# Patient Record
Sex: Male | Born: 1935 | Race: White | Hispanic: No | Marital: Married | Smoking: Former smoker
Health system: Southern US, Community
[De-identification: ages and names within clinical notes are randomized; demographics above are authoritative.]

## PROBLEM LIST (undated history)

## (undated) DIAGNOSIS — R590 Localized enlarged lymph nodes: Secondary | ICD-10-CM

## (undated) DIAGNOSIS — Z9109 Other allergy status, other than to drugs and biological substances: Secondary | ICD-10-CM

## (undated) DIAGNOSIS — R6889 Other general symptoms and signs: Secondary | ICD-10-CM

## (undated) DIAGNOSIS — R5382 Chronic fatigue, unspecified: Secondary | ICD-10-CM

## (undated) DIAGNOSIS — C8596 Non-Hodgkin lymphoma, unspecified, intrapelvic lymph nodes: Secondary | ICD-10-CM

## (undated) DIAGNOSIS — G4730 Sleep apnea, unspecified: Secondary | ICD-10-CM

## (undated) DIAGNOSIS — N281 Cyst of kidney, acquired: Secondary | ICD-10-CM

## (undated) DIAGNOSIS — K219 Gastro-esophageal reflux disease without esophagitis: Secondary | ICD-10-CM

## (undated) DIAGNOSIS — K5730 Diverticulosis of large intestine without perforation or abscess without bleeding: Secondary | ICD-10-CM

## (undated) DIAGNOSIS — K9041 Non-celiac gluten sensitivity: Secondary | ICD-10-CM

## (undated) DIAGNOSIS — G479 Sleep disorder, unspecified: Secondary | ICD-10-CM

## (undated) DIAGNOSIS — K2270 Barrett's esophagus without dysplasia: Secondary | ICD-10-CM

## (undated) DIAGNOSIS — M1990 Unspecified osteoarthritis, unspecified site: Secondary | ICD-10-CM

## (undated) DIAGNOSIS — E739 Lactose intolerance, unspecified: Secondary | ICD-10-CM

## (undated) DIAGNOSIS — R600 Localized edema: Secondary | ICD-10-CM

## (undated) DIAGNOSIS — I10 Essential (primary) hypertension: Secondary | ICD-10-CM

## (undated) DIAGNOSIS — R52 Pain, unspecified: Secondary | ICD-10-CM

## (undated) DIAGNOSIS — K449 Diaphragmatic hernia without obstruction or gangrene: Secondary | ICD-10-CM

## (undated) DIAGNOSIS — I509 Heart failure, unspecified: Secondary | ICD-10-CM

## (undated) DIAGNOSIS — C439 Malignant melanoma of skin, unspecified: Secondary | ICD-10-CM

## (undated) DIAGNOSIS — I499 Cardiac arrhythmia, unspecified: Secondary | ICD-10-CM

## (undated) DIAGNOSIS — J9811 Atelectasis: Secondary | ICD-10-CM

## (undated) DIAGNOSIS — Z923 Personal history of irradiation: Secondary | ICD-10-CM

## (undated) DIAGNOSIS — E785 Hyperlipidemia, unspecified: Secondary | ICD-10-CM

## (undated) DIAGNOSIS — C8590 Non-Hodgkin lymphoma, unspecified, unspecified site: Secondary | ICD-10-CM

## (undated) DIAGNOSIS — J45909 Unspecified asthma, uncomplicated: Secondary | ICD-10-CM

## (undated) DIAGNOSIS — N400 Enlarged prostate without lower urinary tract symptoms: Secondary | ICD-10-CM

## (undated) DIAGNOSIS — Z86010 Personal history of colonic polyps: Secondary | ICD-10-CM

## (undated) DIAGNOSIS — D509 Iron deficiency anemia, unspecified: Secondary | ICD-10-CM

## (undated) DIAGNOSIS — R0989 Other specified symptoms and signs involving the circulatory and respiratory systems: Secondary | ICD-10-CM

## (undated) DIAGNOSIS — K2970 Gastritis, unspecified, without bleeding: Secondary | ICD-10-CM

## (undated) DIAGNOSIS — J449 Chronic obstructive pulmonary disease, unspecified: Secondary | ICD-10-CM

## (undated) HISTORY — DX: Personal history of irradiation: Z92.3

## (undated) HISTORY — PX: TONSILLECTOMY: SHX5217

## (undated) HISTORY — DX: Malignant melanoma of skin, unspecified: C43.9

## (undated) HISTORY — PX: EYE SURGERY: SHX253

## (undated) HISTORY — DX: Unspecified asthma, uncomplicated: J45.909

## (undated) HISTORY — PX: TONSILLECTOMY: SUR1361

## (undated) HISTORY — DX: Hyperlipidemia, unspecified: E78.5

## (undated) HISTORY — DX: Essential (primary) hypertension: I10

## (undated) HISTORY — DX: Localized enlarged lymph nodes: R59.0

## (undated) HISTORY — DX: Atelectasis: J98.11

## (undated) HISTORY — DX: Gastro-esophageal reflux disease without esophagitis: K21.9

## (undated) HISTORY — DX: Non-Hodgkin lymphoma, unspecified, unspecified site: C85.90

## (undated) HISTORY — DX: Non-celiac gluten sensitivity: K90.41

## (undated) HISTORY — PX: CATARACT EXTRACTION, BILATERAL: SHX1313

## (undated) HISTORY — PX: HERNIA REPAIR: SHX51

## (undated) HISTORY — DX: Personal history of colonic polyps: Z86.010

## (undated) HISTORY — PX: ROTATOR CUFF REPAIR: SHX139

## (undated) HISTORY — DX: Sleep disorder, unspecified: G47.9

## (undated) HISTORY — DX: Iron deficiency anemia, unspecified: D50.9

## (undated) HISTORY — DX: Barrett's esophagus without dysplasia: K22.70

## (undated) HISTORY — DX: Benign prostatic hyperplasia without lower urinary tract symptoms: N40.0

## (undated) HISTORY — DX: Diverticulosis of large intestine without perforation or abscess without bleeding: K57.30

## (undated) HISTORY — PX: ARTHROSCOPIC REPAIR ACL: SUR80

## (undated) HISTORY — DX: Gastritis, unspecified, without bleeding: K29.70

## (undated) HISTORY — DX: Non-Hodgkin lymphoma, unspecified, intrapelvic lymph nodes: C85.96

## (undated) HISTORY — DX: Sleep apnea, unspecified: G47.30

## (undated) HISTORY — DX: Other specified symptoms and signs involving the circulatory and respiratory systems: R09.89

## (undated) HISTORY — DX: Unspecified osteoarthritis, unspecified site: M19.90

## (undated) HISTORY — DX: Cyst of kidney, acquired: N28.1

## (undated) HISTORY — DX: Lactose intolerance, unspecified: E73.9

## (undated) HISTORY — DX: Localized edema: R60.0

## (undated) HISTORY — DX: Diaphragmatic hernia without obstruction or gangrene: K44.9

## (undated) HISTORY — DX: Chronic fatigue, unspecified: R53.82

---

## 1998-01-14 ENCOUNTER — Other Ambulatory Visit: Admission: RE | Admit: 1998-01-14 | Discharge: 1998-01-14 | Payer: Self-pay | Admitting: Urology

## 1998-09-23 ENCOUNTER — Ambulatory Visit (HOSPITAL_BASED_OUTPATIENT_CLINIC_OR_DEPARTMENT_OTHER): Admission: RE | Admit: 1998-09-23 | Discharge: 1998-09-23 | Payer: Self-pay | Admitting: Orthopedic Surgery

## 2002-02-01 DIAGNOSIS — Z860100 Personal history of colon polyps, unspecified: Secondary | ICD-10-CM

## 2002-02-01 HISTORY — DX: Personal history of colon polyps, unspecified: Z86.0100

## 2002-02-01 HISTORY — DX: Personal history of colonic polyps: Z86.010

## 2002-07-30 ENCOUNTER — Encounter (INDEPENDENT_AMBULATORY_CARE_PROVIDER_SITE_OTHER): Payer: Self-pay | Admitting: *Deleted

## 2002-07-30 ENCOUNTER — Ambulatory Visit (HOSPITAL_COMMUNITY): Admission: RE | Admit: 2002-07-30 | Discharge: 2002-07-30 | Payer: Self-pay | Admitting: Gastroenterology

## 2005-06-29 ENCOUNTER — Observation Stay (HOSPITAL_COMMUNITY): Admission: EM | Admit: 2005-06-29 | Discharge: 2005-07-01 | Payer: Self-pay | Admitting: Emergency Medicine

## 2005-07-05 ENCOUNTER — Ambulatory Visit: Payer: Self-pay | Admitting: Internal Medicine

## 2005-07-26 ENCOUNTER — Ambulatory Visit (HOSPITAL_COMMUNITY): Admission: RE | Admit: 2005-07-26 | Discharge: 2005-07-26 | Payer: Self-pay | Admitting: Internal Medicine

## 2005-07-26 ENCOUNTER — Ambulatory Visit: Payer: Self-pay | Admitting: Internal Medicine

## 2005-07-29 ENCOUNTER — Ambulatory Visit: Payer: Self-pay | Admitting: Internal Medicine

## 2005-07-30 ENCOUNTER — Ambulatory Visit: Payer: Self-pay | Admitting: Cardiology

## 2005-08-02 ENCOUNTER — Ambulatory Visit: Payer: Self-pay | Admitting: Internal Medicine

## 2005-08-05 ENCOUNTER — Ambulatory Visit: Payer: Self-pay | Admitting: Internal Medicine

## 2005-08-19 ENCOUNTER — Ambulatory Visit: Payer: Self-pay | Admitting: Internal Medicine

## 2005-09-30 ENCOUNTER — Ambulatory Visit: Payer: Self-pay | Admitting: Internal Medicine

## 2005-10-01 ENCOUNTER — Ambulatory Visit: Payer: Self-pay | Admitting: Internal Medicine

## 2005-11-10 ENCOUNTER — Ambulatory Visit: Payer: Self-pay | Admitting: Internal Medicine

## 2005-11-23 ENCOUNTER — Ambulatory Visit: Payer: Self-pay | Admitting: Internal Medicine

## 2005-11-23 LAB — CONVERTED CEMR LAB
AST: 33 units/L (ref 0–37)
Alkaline Phosphatase: 59 units/L (ref 39–117)
Total Bilirubin: 0.6 mg/dL (ref 0.3–1.2)
Total Protein: 6.7 g/dL (ref 6.0–8.3)
Triglyceride fasting, serum: 76 mg/dL (ref 0–149)
VLDL: 15 mg/dL (ref 0–40)

## 2006-07-01 ENCOUNTER — Ambulatory Visit: Payer: Self-pay | Admitting: Internal Medicine

## 2006-07-01 LAB — CONVERTED CEMR LAB
BUN: 27 mg/dL — ABNORMAL HIGH (ref 6–23)
Chloride: 114 meq/L — ABNORMAL HIGH (ref 96–112)
Creatinine, Ser: 1 mg/dL (ref 0.4–1.5)
GFR calc non Af Amer: 78 mL/min
Potassium: 4.2 meq/L (ref 3.5–5.1)

## 2006-07-29 ENCOUNTER — Ambulatory Visit: Payer: Self-pay | Admitting: Internal Medicine

## 2006-07-29 LAB — CONVERTED CEMR LAB
BUN: 26 mg/dL — ABNORMAL HIGH (ref 6–23)
CO2: 28 meq/L (ref 19–32)
Cholesterol: 159 mg/dL (ref 0–200)
Glucose, Bld: 141 mg/dL — ABNORMAL HIGH (ref 70–99)
HDL: 43.8 mg/dL (ref >39.0)
LDL Cholesterol: 94 mg/dL (ref 0–99)
PSA: 1.79 ng/mL (ref 0.10–4.00)
Potassium: 3.7 meq/L (ref 3.5–5.1)
TSH: 0.97 microintl units/mL (ref 0.35–5.50)
Total Bilirubin: 0.6 mg/dL (ref 0.3–1.2)
Total CHOL/HDL Ratio: 3.6
Triglycerides: 108 mg/dL (ref 0–149)
VLDL: 22 mg/dL (ref 0–40)

## 2006-09-14 ENCOUNTER — Encounter: Payer: Self-pay | Admitting: Internal Medicine

## 2006-09-14 DIAGNOSIS — E785 Hyperlipidemia, unspecified: Secondary | ICD-10-CM | POA: Insufficient documentation

## 2006-09-14 DIAGNOSIS — N400 Enlarged prostate without lower urinary tract symptoms: Secondary | ICD-10-CM

## 2006-09-14 DIAGNOSIS — I10 Essential (primary) hypertension: Secondary | ICD-10-CM | POA: Insufficient documentation

## 2006-09-14 DIAGNOSIS — M1990 Unspecified osteoarthritis, unspecified site: Secondary | ICD-10-CM | POA: Insufficient documentation

## 2006-09-14 DIAGNOSIS — Z8679 Personal history of other diseases of the circulatory system: Secondary | ICD-10-CM | POA: Insufficient documentation

## 2007-01-07 ENCOUNTER — Encounter: Payer: Self-pay | Admitting: Internal Medicine

## 2007-01-12 ENCOUNTER — Ambulatory Visit: Payer: Self-pay | Admitting: Internal Medicine

## 2007-01-12 ENCOUNTER — Telehealth: Payer: Self-pay | Admitting: Internal Medicine

## 2007-01-12 LAB — CONVERTED CEMR LAB
AST: 22 units/L (ref 0–37)
Calcium: 8.7 mg/dL (ref 8.4–10.5)
Cholesterol: 185 mg/dL (ref 0–200)
Creatinine, Ser: 0.9 mg/dL (ref 0.4–1.5)
GFR calc Af Amer: 106 mL/min
GFR calc non Af Amer: 88 mL/min
Glucose, Bld: 98 mg/dL (ref 70–99)
HDL: 58.2 mg/dL (ref >39.0)
Sodium: 140 meq/L (ref 135–145)
VLDL: 22 mg/dL (ref 0–40)

## 2007-01-16 ENCOUNTER — Encounter: Payer: Self-pay | Admitting: Internal Medicine

## 2007-01-17 ENCOUNTER — Telehealth: Payer: Self-pay | Admitting: Internal Medicine

## 2007-01-24 ENCOUNTER — Ambulatory Visit: Payer: Self-pay | Admitting: Internal Medicine

## 2007-01-24 DIAGNOSIS — J069 Acute upper respiratory infection, unspecified: Secondary | ICD-10-CM | POA: Insufficient documentation

## 2007-06-20 ENCOUNTER — Ambulatory Visit: Payer: Self-pay | Admitting: Internal Medicine

## 2007-07-05 ENCOUNTER — Telehealth (INDEPENDENT_AMBULATORY_CARE_PROVIDER_SITE_OTHER): Payer: Self-pay | Admitting: *Deleted

## 2007-08-09 ENCOUNTER — Ambulatory Visit: Payer: Self-pay | Admitting: Internal Medicine

## 2007-08-09 DIAGNOSIS — K900 Celiac disease: Secondary | ICD-10-CM

## 2007-08-09 LAB — CONVERTED CEMR LAB
ALT: 22 units/L (ref 0–53)
Alkaline Phosphatase: 76 units/L (ref 39–117)
Cholesterol: 170 mg/dL (ref 0–200)
Glucose, Bld: 93 mg/dL (ref 70–99)
HDL: 45.9 mg/dL (ref >39.0)
LDL Cholesterol: 98 mg/dL (ref 0–99)
Total Bilirubin: 0.7 mg/dL (ref 0.3–1.2)
VLDL: 26 mg/dL (ref 0–40)

## 2007-08-11 ENCOUNTER — Encounter: Payer: Self-pay | Admitting: Internal Medicine

## 2007-08-12 ENCOUNTER — Encounter: Payer: Self-pay | Admitting: Internal Medicine

## 2007-08-12 ENCOUNTER — Emergency Department (HOSPITAL_COMMUNITY): Admission: EM | Admit: 2007-08-12 | Discharge: 2007-08-12 | Payer: Self-pay | Admitting: Family Medicine

## 2007-10-17 ENCOUNTER — Ambulatory Visit: Payer: Self-pay | Admitting: Internal Medicine

## 2007-10-17 DIAGNOSIS — E739 Lactose intolerance, unspecified: Secondary | ICD-10-CM

## 2007-11-17 ENCOUNTER — Ambulatory Visit: Payer: Self-pay | Admitting: Internal Medicine

## 2007-12-25 ENCOUNTER — Ambulatory Visit: Payer: Self-pay | Admitting: Internal Medicine

## 2007-12-25 ENCOUNTER — Ambulatory Visit: Payer: Self-pay

## 2007-12-25 DIAGNOSIS — R002 Palpitations: Secondary | ICD-10-CM | POA: Insufficient documentation

## 2008-01-07 ENCOUNTER — Encounter: Payer: Self-pay | Admitting: Internal Medicine

## 2008-01-17 ENCOUNTER — Telehealth: Payer: Self-pay | Admitting: Internal Medicine

## 2008-01-22 ENCOUNTER — Ambulatory Visit: Payer: Self-pay | Admitting: Internal Medicine

## 2008-01-23 ENCOUNTER — Ambulatory Visit: Payer: Self-pay | Admitting: Internal Medicine

## 2008-01-23 LAB — CONVERTED CEMR LAB
Cholesterol: 171 mg/dL (ref 0–200)
HDL: 41.8 mg/dL (ref >39.0)
LDL Cholesterol: 102 mg/dL — ABNORMAL HIGH (ref 0–99)
VLDL: 27 mg/dL (ref 0–40)

## 2008-02-08 ENCOUNTER — Encounter: Payer: Self-pay | Admitting: Internal Medicine

## 2008-02-09 ENCOUNTER — Telehealth: Payer: Self-pay | Admitting: Internal Medicine

## 2008-02-12 ENCOUNTER — Ambulatory Visit: Payer: Self-pay | Admitting: Internal Medicine

## 2008-02-12 DIAGNOSIS — K439 Ventral hernia without obstruction or gangrene: Secondary | ICD-10-CM | POA: Insufficient documentation

## 2008-02-28 ENCOUNTER — Encounter: Payer: Self-pay | Admitting: Internal Medicine

## 2008-04-16 ENCOUNTER — Encounter: Payer: Self-pay | Admitting: Internal Medicine

## 2008-04-22 ENCOUNTER — Telehealth: Payer: Self-pay | Admitting: Internal Medicine

## 2008-04-22 ENCOUNTER — Encounter: Payer: Self-pay | Admitting: Internal Medicine

## 2008-05-16 ENCOUNTER — Telehealth: Payer: Self-pay | Admitting: Internal Medicine

## 2008-05-20 ENCOUNTER — Ambulatory Visit: Payer: Self-pay | Admitting: Internal Medicine

## 2008-05-20 DIAGNOSIS — J3089 Other allergic rhinitis: Secondary | ICD-10-CM

## 2008-05-20 DIAGNOSIS — B370 Candidal stomatitis: Secondary | ICD-10-CM

## 2008-08-01 ENCOUNTER — Ambulatory Visit: Payer: Self-pay | Admitting: Internal Medicine

## 2008-08-01 DIAGNOSIS — N529 Male erectile dysfunction, unspecified: Secondary | ICD-10-CM | POA: Insufficient documentation

## 2008-08-13 ENCOUNTER — Telehealth: Payer: Self-pay | Admitting: Internal Medicine

## 2008-08-19 ENCOUNTER — Encounter: Payer: Self-pay | Admitting: Internal Medicine

## 2008-10-04 ENCOUNTER — Telehealth: Payer: Self-pay | Admitting: Internal Medicine

## 2008-11-09 ENCOUNTER — Telehealth: Payer: Self-pay | Admitting: Family Medicine

## 2008-12-23 ENCOUNTER — Telehealth: Payer: Self-pay | Admitting: Internal Medicine

## 2008-12-23 ENCOUNTER — Ambulatory Visit: Payer: Self-pay | Admitting: Internal Medicine

## 2008-12-23 DIAGNOSIS — H6980 Other specified disorders of Eustachian tube, unspecified ear: Secondary | ICD-10-CM

## 2008-12-23 LAB — CONVERTED CEMR LAB
ALT: 29 units/L (ref 0–53)
AST: 27 units/L (ref 0–37)
Alkaline Phosphatase: 84 units/L (ref 39–117)
BUN: 22 mg/dL (ref 6–23)
CO2: 31 meq/L (ref 19–32)
Calcium: 8.8 mg/dL (ref 8.4–10.5)
Chloride: 104 meq/L (ref 96–112)
Direct LDL: 165.7 mg/dL
GFR calc non Af Amer: 87.41 mL/min (ref >60)
Glucose, Bld: 100 mg/dL — ABNORMAL HIGH (ref 70–99)
Total Bilirubin: 0.6 mg/dL (ref 0.3–1.2)
Total CHOL/HDL Ratio: 5
VLDL: 25.2 mg/dL (ref 0.0–40.0)

## 2008-12-24 DIAGNOSIS — G4700 Insomnia, unspecified: Secondary | ICD-10-CM

## 2008-12-30 ENCOUNTER — Encounter: Payer: Self-pay | Admitting: Internal Medicine

## 2009-01-13 ENCOUNTER — Ambulatory Visit: Payer: Self-pay | Admitting: Internal Medicine

## 2009-01-13 ENCOUNTER — Telehealth (INDEPENDENT_AMBULATORY_CARE_PROVIDER_SITE_OTHER): Payer: Self-pay | Admitting: *Deleted

## 2009-01-20 ENCOUNTER — Telehealth: Payer: Self-pay | Admitting: Internal Medicine

## 2009-01-23 ENCOUNTER — Telehealth: Payer: Self-pay | Admitting: Internal Medicine

## 2009-02-11 ENCOUNTER — Encounter: Payer: Self-pay | Admitting: Internal Medicine

## 2009-02-28 ENCOUNTER — Encounter: Payer: Self-pay | Admitting: Internal Medicine

## 2009-03-04 ENCOUNTER — Encounter: Payer: Self-pay | Admitting: Internal Medicine

## 2009-03-06 ENCOUNTER — Encounter: Payer: Self-pay | Admitting: Internal Medicine

## 2009-03-24 ENCOUNTER — Telehealth: Payer: Self-pay | Admitting: Internal Medicine

## 2009-03-25 ENCOUNTER — Encounter: Payer: Self-pay | Admitting: Internal Medicine

## 2009-04-22 ENCOUNTER — Ambulatory Visit: Payer: Self-pay | Admitting: Internal Medicine

## 2009-05-20 ENCOUNTER — Telehealth: Payer: Self-pay | Admitting: Internal Medicine

## 2009-08-19 ENCOUNTER — Ambulatory Visit: Payer: Self-pay | Admitting: Internal Medicine

## 2009-08-19 DIAGNOSIS — I872 Venous insufficiency (chronic) (peripheral): Secondary | ICD-10-CM

## 2009-08-19 DIAGNOSIS — R609 Edema, unspecified: Secondary | ICD-10-CM

## 2009-08-19 LAB — CONVERTED CEMR LAB
Basophils Absolute: 0 10*3/uL (ref 0.0–0.1)
Basophils Relative: 0.6 % (ref 0.0–3.0)
Chloride: 110 meq/L (ref 96–112)
Eosinophils Relative: 1.3 % (ref 0.0–5.0)
GFR calc non Af Amer: 86.15 mL/min (ref >60)
Lymphocytes Relative: 21.2 % (ref 12.0–46.0)
Lymphs Abs: 1.7 10*3/uL (ref 0.7–4.0)
MCHC: 34.2 g/dL (ref 30.0–36.0)
Neutrophils Relative %: 72.2 % (ref 43.0–77.0)
Platelets: 232 10*3/uL (ref 150.0–400.0)
Potassium: 4.1 meq/L (ref 3.5–5.1)
Pro B Natriuretic peptide (BNP): 50 pg/mL (ref 0.0–100.0)
RBC: 4.63 M/uL (ref 4.22–5.81)
Sodium: 144 meq/L (ref 135–145)

## 2009-08-29 ENCOUNTER — Ambulatory Visit: Payer: Self-pay | Admitting: Cardiovascular Disease

## 2009-08-29 ENCOUNTER — Ambulatory Visit (HOSPITAL_COMMUNITY): Admission: RE | Admit: 2009-08-29 | Discharge: 2009-08-29 | Payer: Self-pay | Admitting: Internal Medicine

## 2009-08-29 ENCOUNTER — Encounter: Payer: Self-pay | Admitting: Internal Medicine

## 2009-08-29 ENCOUNTER — Ambulatory Visit: Payer: Self-pay

## 2009-09-30 ENCOUNTER — Telehealth: Payer: Self-pay | Admitting: Internal Medicine

## 2009-10-07 ENCOUNTER — Encounter: Payer: Self-pay | Admitting: Internal Medicine

## 2009-10-08 ENCOUNTER — Telehealth: Payer: Self-pay | Admitting: Internal Medicine

## 2010-03-05 NOTE — Letter (Signed)
Summary: [facility]  [facility]   Imported By: Phillis Knack 03/26/2009 07:54:24  _____________________________________________________________________  External Attachment:    Type:   Image     Comment:   External Document

## 2010-03-05 NOTE — Progress Notes (Signed)
  Phone Note Outgoing Call Call back at [facility] Phone [phone]   Call placed by: norins Call placed to: Patient Reason for Call: Discuss lab or test results Summary of Call: told him to check the June 19th note I sent which included all lab. He was informed that his 2 D echo did not reveal any cause for weakness. He reports that he is working on loosing weight, has been walking and feels great. No need for f/u exam.  Initial call taken by: Neena Rhymes MD,  September 30, 2009 1:12 PM

## 2010-03-05 NOTE — Assessment & Plan Note (Signed)
Summary: FATIGUE/ SWOLLEN ANKLES/ NWS   Vital Signs:  Patient profile:   77 year old male Height:      67.5 inches Weight:      184 pounds BMI:     28.50 O2 Sat:      97 % on Room air Temp:     97.1 degrees F oral Pulse rate:   88 / minute BP sitting:   130 / 82  (left arm) Cuff size:   regular  Vitals Entered By: Charlynne Cousins CMA (August 19, 2009 2:04 PM)  O2 Flow:  Room air CC: pt here with c/o swollen ankles and fatigue/ ab   Primary Care Provider:  Neena Rhymes MD  CC:  pt here with c/o swollen ankles and fatigue/ ab.  History of Present Illness: Presents for swollen ankles and also for severe fatigue and decreased exercise tolerance. He, and his wife, are concerned for possible heart disease. He was evaluated about 4 years ago for CAD/MI with a negative work-up. He had a negative cardiac cath in '07, 2D echo in '07 -per patient negative study. Lab work as recently as Nov '10 with nl Creatinine and GFR.  Patient has a problem with fatigue. Again labs in the Fall in years past revealed no abnormaltiy.   Current Medications (verified): 1)  Low-Dose Aspirin 81 Mg  Tabs (Aspirin) .... Take 1 Tablet By Mouth Once A Day 2)  Norvasc 5 Mg  Tabs (Amlodipine Besylate) .... Take 1/2 Tablet By Mouth Once A Day 3)  Diclofenac Sodium 75 Mg  Tbec (Diclofenac Sodium) .... Take 1 Tablet By Mouth Once A Day As Needed 4)  Vitamin D 1000 Unit  Tabs (Cholecalciferol) .... Take 1 Tablet By Mouth Once A Day 5)  Nasonex 50 Mcg/act  Susp (Mometasone Furoate) .Marland Kitchen.. 1 Spray Each Nostril As Needed Once Daily 6)  Benazepril-Hydrochlorothiazide 10-12.5 Mg  Tabs (Benazepril-Hydrochlorothiazide) .... Take 1 Tablet By Mouth Once A Day 7)  Finasteride 5 Mg  Tabs (Finasteride) .... Take 1 Tablet By Mouth Once A Day 8)  Epipen Jr 0.15 Mg/0.27m (1:2000)  Devi (Epinephrine Hcl (Anaphylaxis)) .... Inject Subcutaneously As Needed 9)  Alprazolam 0.5 Mg Tabs (Alprazolam) .... Take 1 Tablet By Mouth At Bedtime  Prn 10)  Fish Oil 1000 Mg  Caps (Omega-3 Fatty Acids) .... Take 1 Tablet By Mouth Two Times A Day 11)  Multivitamins   Tabs (Multiple Vitamin) .... Take 1 Tablet By Mouth Once A Day 12)  Cialis 20 Mg Tabs (Tadalafil) ..Marland Kitchen. 1 As Needed 13)  Promethazine-Codeine 6.25-10 Mg/517mSyrp (Promethazine-Codeine) ...Marland Kitchen 1 Tsp Q 6 Hours, 2 Tsp At Bedtime. 14)  Omeprazole 20 Mg Cpdr (Omeprazole) ...Marland Kitchen 1 Once Daily  Allergies (verified): No Known Drug Allergies  Past History:  Past Medical History: Last updated: 12/23/2008 Hyperlipidemia Hypertension Benign prostatic hypertrophy Osteoarthritis Chronic fatigue gluten allergy lactose intolerance melanoma(?) Chronic sleep disorder     physician roster:      cardilogy - Dr. KeClaiborne Billings    [facility] - Dr. DaLuther Bradley Dr. PaAdriana Mccallum Dr. JoRonnald Ramp    Allergy - Dr. ShDonneta Romberg    Rheum - Dr. TrRebeca Allegra Dr. EpCharise Killian    GI -  Dr. MaCollene Maresllergic rhinitis  Past Surgical History: Last updated: 08/09/2007 Rotator cuff repair Hernia repair arthroscopic knee sx Cataract extraction Tonsillectomy  Social History: Last updated:  08/09/2007 Married - second marriage he has two children retired Hospital doctor currently teaches pottery making, has a Government social research officer in [employer] Former Smoker  quit 12 years ago - smoked for 35 years Alcohol use-yes  Review of Systems  The patient denies anorexia, fever, weight loss, weight gain, decreased hearing, hoarseness, chest pain, syncope, dyspnea on exertion, prolonged cough, headaches, abdominal pain, incontinence, muscle weakness, suspicious skin lesions, and unusual weight change.    Physical Exam  General:  WNWD white male in no distress. Head:  normocephalic and atraumatic.   Eyes:  C&S clear Neck:  supple and no JVD.   Lungs:  normal respiratory effort and normal breath sounds.   Heart:  normal rate and regular rhythm.   Pulses:  2+ radial pulse Extremities:  1+ pedal  edema Neurologic:  alert & oriented X3, cranial nerves II-XII intact, strength normal in all extremities, and gait normal.   Skin:  turgor normal and color normal.     Impression & Recommendations:  Problem # 1:  PEDAL EDEMA (ICD-782.3) Reviewed records including previous cath report and renal function labs. No underlying systemic cause for pedal edema based on those studies. Reviewed the mechinism of venous insufficiency.  Plan - repeat 2D echo  His updated medication list for this problem includes:    Benazepril-hydrochlorothiazide 10-12.5 Mg Tabs (Benazepril-hydrochlorothiazide) .Marland Kitchen... Take 1 tablet by mouth once a day  Problem # 2:  FATIGUE (ICD-780.79) Patient with complaints of excessive fatigue and decreased energy. Exam is unrevealing. Previous lab studies normal as well as 2D echo of '07.  Plan - r/o metabolic derangement and cardiac dysfunction as cause of fatigue.   Orders: TLB-B12 + Folate Pnl (82746_82607-B12/FOL) TLB-BNP (B-Natriuretic Peptide) (83880-BNPR) TLB-BMP (Basic Metabolic Panel-BMET) (16109-UEAVWUJ) TLB-CBC Platelet - w/Differential (85025-CBCD) TLB-TSH (Thyroid Stimulating Hormone) (81191-YNW) Cardiology Referral (Cardiology)  Addendum - lab results are normal.                     2D echo pending.  Complete Medication List: 1)  Low-dose Aspirin 81 Mg Tabs (Aspirin) .... Take 1 tablet by mouth once a day 2)  Norvasc 5 Mg Tabs (Amlodipine besylate) .... Take 1/2 tablet by mouth once a day 3)  Diclofenac Sodium 75 Mg Tbec (Diclofenac sodium) .... Take 1 tablet by mouth once a day as needed 4)  Vitamin D 1000 Unit Tabs (Cholecalciferol) .... Take 1 tablet by mouth once a day 5)  Nasonex 50 Mcg/act Susp (Mometasone furoate) .Marland Kitchen.. 1 spray each nostril as needed once daily 6)  Benazepril-hydrochlorothiazide 10-12.5 Mg Tabs (Benazepril-hydrochlorothiazide) .... Take 1 tablet by mouth once a day 7)  Finasteride 5 Mg Tabs (Finasteride) .... Take 1 tablet by mouth  once a day 8)  Epipen Jr 0.15 Mg/0.22m (1:2000) Devi (Epinephrine hcl (anaphylaxis)) .... Inject subcutaneously as needed 9)  Alprazolam 0.5 Mg Tabs (Alprazolam) .... Take 1 tablet by mouth at bedtime prn 10)  Fish Oil 1000 Mg Caps (Omega-3 fatty acids) .... Take 1 tablet by mouth two times a day 11)  Multivitamins Tabs (Multiple vitamin) .... Take 1 tablet by mouth once a day 12)  Cialis 20 Mg Tabs (Tadalafil) ..Marland Kitchen. 1 as needed 13)  Promethazine-codeine 6.25-10 Mg/554mSyrp (Promethazine-codeine) ...Marland Kitchen 1 tsp q 6 hours, 2 tsp at bedtime. 14)  Omeprazole 20 Mg Cpdr (Omeprazole) ...Marland Kitchen 1 once daily  Patient: Ryan [name] Note: All result statuses are Final unless otherwise noted.  Tests: (1) B12 + Folate Panel (B12/FOL)   Vitamin B12  286 pg/mL                   211-911   Folate                    15.5 ng/mL     Deficient  0.4 - 3.4 ng/mL     Indeterminate  3.4 - 5.4 ng/mL     Normal  >5.4 ng/mL  Tests: (2) B-Type Natiuretic Peptide (BNPR)  B-Type Natriuetic Peptide                             50.0 pg/mL                  0.0-100.0  Tests: (3) BMP (METABOL)   Sodium                    144 mEq/L                   135-145   Potassium                 4.1 mEq/L                   3.5-5.1   Chloride                  110 mEq/L                   96-112   Carbon Dioxide            29 mEq/L                    19-32   Glucose              [H]  100 mg/dL                   70-99   BUN                  [H]  28 mg/dL                    6-23   Creatinine                0.9 mg/dL                   0.4-1.5   Calcium                   8.9 mg/dL                   8.4-10.5   GFR                       86.15 mL/min                >60  Tests: (4) CBC Platelet w/Diff (CBCD)   White Cell Count          8.0 K/uL                    4.5-10.5   Red Cell Count            4.63 Mil/uL                 4.22-5.81   Hemoglobin  14.7 g/dL                   13.0-17.0   Hematocrit                 43.1 %                      39.0-52.0   MCV                       93.0 fl                     78.0-100.0   MCHC                      34.2 g/dL                   30.0-36.0   RDW                       13.1 %                      11.5-14.6   Platelet Count            232.0 K/uL                  150.0-400.0   Neutrophil %              72.2 %                      43.0-77.0   Lymphocyte %              21.2 %                      12.0-46.0   Monocyte %                4.7 %                       3.0-12.0   Eosinophils%              1.3 %                       0.0-5.0   Basophils %               0.6 %                       0.0-3.0   Neutrophill Absolute      5.8 K/uL                    1.4-7.7   Lymphocyte Absolute       1.7 K/uL                    0.7-4.0   Monocyte Absolute         0.4 K/uL                    0.1-1.0  Eosinophils, Absolute                             0.1 K/uL  0.0-0.7   Basophils Absolute        0.0 K/uL                    0.0-0.1  Tests: (5) TSH (TSH)   FastTSH                   1.26 uIU/mL                 0.35-5.50

## 2010-03-05 NOTE — Letter (Signed)
Summary: [facility]  [facility]   Imported By: Bubba Hales 10/13/2009 10:13:19  _____________________________________________________________________  External Attachment:    Type:   Image     Comment:   External Document

## 2010-03-05 NOTE — Letter (Signed)
Summary: [facility]  [facility]   Imported By: Phillis Knack 03/27/2009 11:07:44  _____________________________________________________________________  External Attachment:    Type:   Image     Comment:   External Document

## 2010-03-05 NOTE — Miscellaneous (Signed)
[facility]: [facility]   Imported By: Bubba Hales 05/01/2009 11:07:00  _____________________________________________________________________  External Attachment:    Type:   Image     Comment:   External Document

## 2010-03-05 NOTE — Letter (Signed)
Summary: [facility]  [facility]   Imported By: Phillis Knack 03/10/2009 09:50:50  _____________________________________________________________________  External Attachment:    Type:   Image     Comment:   External Document

## 2010-03-05 NOTE — Letter (Signed)
Summary: [facility]  [facility]   Imported By: Phillis Knack 03/10/2009 09:43:34  _____________________________________________________________________  External Attachment:    Type:   Image     Comment:   External Document

## 2010-03-05 NOTE — Assessment & Plan Note (Signed)
Summary: CHEST CONGEST--SORE THROAT--STC   Vital Signs:  Patient profile:   77 year old male Height:      67.5 inches (171.45 cm) Weight:      184 pounds (83.64 kg) O2 Sat:      95 % on Room air Temp:     98.0 degrees F (36.67 degrees C) oral Pulse rate:   66 / minute BP sitting:   122 / 78  (left arm) Cuff size:   regular  Vitals Entered By: Charlynne Cousins CMA (April 22, 2009 9:03 AM) Taken by Felipa Evener, SMA  O2 Flow:  Room air CC: Pt c/o chest congestion and sore throat x 8 days, pt states he has slight mucus, clear.//[place]   Primary Care Provider:  Neena Rhymes MD  CC:  Pt c/o chest congestion and sore throat x 8 days, pt states he has slight mucus, and clear.//[place].  History of Present Illness: Patient presents for evaluation of a persistent sore throat, cough and chest congestion. He was recently to the [place] [employer] for the funeral of his sister and during that time was exposed to folks with illness. No fever, chills. Mild SOB. He does have a lot of phlegm but has not noted his sputum.   Current Medications (verified): 1)  Low-Dose Aspirin 81 Mg  Tabs (Aspirin) .... Take 1 Tablet By Mouth Once A Day 2)  Norvasc 5 Mg  Tabs (Amlodipine Besylate) .... Take 1/2 Tablet By Mouth Once A Day 3)  Diclofenac Sodium 75 Mg  Tbec (Diclofenac Sodium) .... Take 1 Tablet By Mouth Once A Day As Needed 4)  Vitamin D 1000 Unit  Tabs (Cholecalciferol) .... Take 1 Tablet By Mouth Once A Day 5)  Nasonex 50 Mcg/act  Susp (Mometasone Furoate) .Marland Kitchen.. 1 Spray Each Nostril As Needed Once Daily 6)  Benazepril-Hydrochlorothiazide 10-12.5 Mg  Tabs (Benazepril-Hydrochlorothiazide) .... Take 1 Tablet By Mouth Once A Day 7)  Finasteride 5 Mg  Tabs (Finasteride) .... Take 1 Tablet By Mouth Once A Day 8)  Epipen Jr 0.15 Mg/0.39m (1:2000)  Devi (Epinephrine Hcl (Anaphylaxis)) .... Inject Subcutaneously As Needed 9)  Alprazolam 0.5 Mg Tabs (Alprazolam) .... Take 1 Tablet By Mouth At Bedtime Prn 10)  Fish Oil  1000 Mg  Caps (Omega-3 Fatty Acids) .... Take 1 Tablet By Mouth Two Times A Day 11)  Multivitamins   Tabs (Multiple Vitamin) .... Take 1 Tablet By Mouth Once A Day 12)  Cialis 20 Mg Tabs (Tadalafil) ..Marland Kitchen. 1 As Needed  Allergies (verified): No Known Drug Allergies  Past History:  Past Medical History: Last updated: 12/23/2008 Hyperlipidemia Hypertension Benign prostatic hypertrophy Osteoarthritis Chronic fatigue gluten allergy lactose intolerance melanoma(?) Chronic sleep disorder     physician roster:      cardilogy - Dr. KClaiborne Billings     [facility] - Dr. DLuther Bradley- Dr. PAdriana Mccallum- Dr. JRonnald Ramp     Allergy - Dr. SDonneta Romberg     Rheum - Dr. TRebeca Allegra- Dr. ECharise Killian     GI -  Dr. MCollene MaresAllergic rhinitis  Past Surgical History: Last updated: 08/09/2007 Rotator cuff repair Hernia repair arthroscopic knee sx Cataract extraction Tonsillectomy PSH reviewed for relevance, FH reviewed for relevance  Review of Systems  The patient denies anorexia, fever, weight loss, weight gain, decreased hearing, hoarseness, chest pain, dyspnea on exertion, peripheral edema, headaches, abdominal pain, hematochezia, muscle weakness, difficulty  walking, and abnormal bleeding.    Physical Exam  General:  alert, well-developed, and well-nourished.   Head:  normocephalic, atraumatic, and no abnormalities observed.   Eyes:  vision grossly intact, pupils equal, and pupils round.   Ears:  R ear normal and L ear normal.   Nose:  no external deformity and no external erythema.   Mouth:  posterior pharynx clear Neck:  supple and full ROM.   Lungs:  normal respiratory effort, no intercostal retractions, no accessory muscle use, normal breath sounds, no crackles, and no wheezes.   Heart:  normal rate, regular rhythm, and no murmur.   Neurologic:  alert & oriented X3 and cranial nerves II-XII intact.   Skin:  turgor normal, color normal, and no rashes.   Cervical Nodes:  no anterior cervical  adenopathy and no posterior cervical adenopathy.     Impression & Recommendations:  Problem # 1:  UPPER RESPIRATORY INFECTION (ICD-465.9) No evidence of bacterial infection.  Plan - supportive care           prom/cod 1 tsp q 6, 2 tsp at bedtime  His updated medication list for this problem includes:    Low-dose Aspirin 81 Mg Tabs (Aspirin) .Marland Kitchen... Take 1 tablet by mouth once a day    Diclofenac Sodium 75 Mg Tbec (Diclofenac sodium) .Marland Kitchen... Take 1 tablet by mouth once a day as needed    Promethazine-codeine 6.25-10 Mg/48m Syrp (Promethazine-codeine) ..Marland Kitchen.. 1 tsp q 6 hours, 2 tsp at bedtime.  Complete Medication List: 1)  Low-dose Aspirin 81 Mg Tabs (Aspirin) .... Take 1 tablet by mouth once a day 2)  Norvasc 5 Mg Tabs (Amlodipine besylate) .... Take 1/2 tablet by mouth once a day 3)  Diclofenac Sodium 75 Mg Tbec (Diclofenac sodium) .... Take 1 tablet by mouth once a day as needed 4)  Vitamin D 1000 Unit Tabs (Cholecalciferol) .... Take 1 tablet by mouth once a day 5)  Nasonex 50 Mcg/act Susp (Mometasone furoate) ..Marland Kitchen. 1 spray each nostril as needed once daily 6)  Benazepril-hydrochlorothiazide 10-12.5 Mg Tabs (Benazepril-hydrochlorothiazide) .... Take 1 tablet by mouth once a day 7)  Finasteride 5 Mg Tabs (Finasteride) .... Take 1 tablet by mouth once a day 8)  Epipen Jr 0.15 Mg/0.343m(1:2000) Devi (Epinephrine hcl (anaphylaxis)) .... Inject subcutaneously as needed 9)  Alprazolam 0.5 Mg Tabs (Alprazolam) .... Take 1 tablet by mouth at bedtime prn 10)  Fish Oil 1000 Mg Caps (Omega-3 fatty acids) .... Take 1 tablet by mouth two times a day 11)  Multivitamins Tabs (Multiple vitamin) .... Take 1 tablet by mouth once a day 12)  Cialis 20 Mg Tabs (Tadalafil) ...Marland Kitchen 1 as needed 13)  Promethazine-codeine 6.25-10 Mg/34m734myrp (Promethazine-codeine) ....Marland Kitchen1 tsp q 6 hours, 2 tsp at bedtime.  Patient Instructions: 1)  Upper Respiratory infection - no evidence of bacterial infection, hence no need for  antibiotics. Plan - supportive care: vitamin C 1500m38mily, ecchinacea if you believe, tylenol for fever or aches. For cough promethazine with codeine 1 tsp every 6 hours 2 tsp at bedtime. Hydrate, rest. GET WELL Prescriptions: PROMETHAZINE-CODEINE 6.25-10 MG/5ML SYRP (PROMETHAZINE-CODEINE) 1 tsp q 6 hours, 2 tsp at bedtime.  #8 oz x 1   Entered and Authorized by:   MichNeena Rhymes  Signed by:   MichNeena Rhymeson 04/23/2009   Method used:   Handwritten   RxID:   :   [id number]

## 2010-03-05 NOTE — Progress Notes (Signed)
Summary: OMEPRAZOLE  Phone Note Call from Patient Call back at [phone] cell   Caller: Patient Summary of Call: Patient called [facility] stating that pharmacy advised him to call office to find out about omeprazole denial. Initial call taken by: Estell Harpin CMA,  May 20, 2009 1:12 PM  Follow-up for Phone Call        Left mess for pt to contact office if he is still taking omeprazole. If pt returns call that he wants to resume med, is it ok to fill?  Follow-up by: Charlsie Quest, CMA,  May 21, 2009 1:56 PM  Additional Follow-up for Phone Call Additional follow up Details #1::        Pt returned call, he would like refill  Additional Follow-up by: Charlsie Quest, CMA,  May 21, 2009 3:49 PM    Additional Follow-up for Phone Call Additional follow up Details #2::    ok to refill omeprazole 20 mg # 30 , 1 by mouth q aM   refill prn Follow-up by: Neena Rhymes MD,  May 21, 2009 4:58 PM  New/Updated Medications: OMEPRAZOLE 20 MG CPDR (OMEPRAZOLE) 1 once daily Prescriptions: OMEPRAZOLE 20 MG CPDR (OMEPRAZOLE) 1 once daily  #90 x 3   Entered by:   Charlsie Quest, CMA   Authorized by:   Neena Rhymes MD   Signed by:   Charlsie Quest, CMA on 05/21/2009   Method used:   Electronically to        [facility]. [phone]* (retail)       [address]       [place], [place]  [address]       Ph: [phone]       Fax: [phone]   RxID:   [id number]

## 2010-03-05 NOTE — Progress Notes (Signed)
  Phone Note Refill Request Message from:  Fax from Pharmacy on October 08, 2009 9:21 AM  Refills Requested: Medication #1:  ALPRAZOLAM 0.5 MG TABS Take 1 tablet by mouth at bedtime prn   Last Refilled: 07/02/2009 last OV was 08/19/2009 please Advise refill  Initial call taken by: Ami Bullins CMA,  October 08, 2009 9:21 AM  Follow-up for Phone Call        ok to refill x 5 Follow-up by: Neena Rhymes MD,  October 08, 2009 10:46 AM    Prescriptions: ALPRAZOLAM 0.5 MG TABS (ALPRAZOLAM) Take 1 tablet by mouth at bedtime prn  #100 x 5   Entered by:   Ami Bullins CMA   Authorized by:   Neena Rhymes MD   Signed by:   Charlynne Cousins CMA on 10/08/2009   Method used:   Telephoned to ...       [facility]. [phone]* (retail)       [address]       [place], [place]  [address]       Ph: [phone]       Fax: [phone]   RxID:   [id number]

## 2010-03-05 NOTE — Progress Notes (Signed)
  Phone Note Refill Request Message from:  Fax from Pharmacy on March 24, 2009 11:25 AM  Refills Requested: Medication #1:  ALPRAZOLAM 0.5 MG TABS Take 1 tablet by mouth at bedtime prn   Last Refilled: 10/04/2008 pt last office was was 12/23/2008, please advise refill  Initial call taken by: Ami Bullins CMA,  March 24, 2009 11:25 AM  Follow-up for Phone Call        ok for refill x 5 Follow-up by: Neena Rhymes MD,  March 24, 2009 5:34 PM    Prescriptions: ALPRAZOLAM 0.5 MG TABS (ALPRAZOLAM) Take 1 tablet by mouth at bedtime prn  #100 x 5   Entered by:   Ami Bullins CMA   Authorized by:   Neena Rhymes MD   Signed by:   Charlynne Cousins CMA on 03/25/2009   Method used:   Telephoned to ...       [facility]. [phone]* (retail)       [address]       [place], [place]  [address]       Ph: [phone]       Fax: [phone]   RxID:   [id number]

## 2010-03-05 NOTE — Consult Note (Signed)
Summary: [facility]  [facility]   Imported By: Phillis Knack 02/17/2009 13:21:44  _____________________________________________________________________  External Attachment:    Type:   Image     Comment:   External Document

## 2010-04-23 ENCOUNTER — Ambulatory Visit (INDEPENDENT_AMBULATORY_CARE_PROVIDER_SITE_OTHER): Payer: Medicare Other | Admitting: Internal Medicine

## 2010-04-23 ENCOUNTER — Encounter: Payer: Self-pay | Admitting: Internal Medicine

## 2010-04-23 ENCOUNTER — Encounter: Payer: Self-pay | Admitting: Gastroenterology

## 2010-04-23 DIAGNOSIS — R109 Unspecified abdominal pain: Secondary | ICD-10-CM

## 2010-04-23 NOTE — Patient Instructions (Signed)
May be an obstructive process that is causing trouble. Please increase zantac to 374m AM and bedtime. Will refer to GI for further evaluation and probable upper endoscopy.

## 2010-04-23 NOTE — Progress Notes (Signed)
  Subjective:    Patient ID: Ryan [name], male    DOB: [birth year], 77 y.o.   MRN: [id number]  HPI Patient presents for a problem with swallowing: 10 days ago while eating he had a stomach cramp after swallowing which felt similar to problem with esophageal symptoms. This lasted for hours and was quite painful. Since then he continues to have symptoms associated with eating. The location of discomfort is just above the umbilicus. No change in bowel habit. No N/V. He intermittently gets relief with H2 blocker. He does feel bloated with his symptoms. His baseline eructation is not changed with these episodes. No change in the color of stool, no change in caliber, no steatorrhea.  He had previously taking PPI but stopped for diminished effectiveness and problem with rebound. He transitioned himself to H2 blocker which did control his symptoms except when he had dietary indiscretion. He has never had endoscopy.   Past Medical History  Diagnosis Date  . Hyperlipemia   . Hypertension   . Benign prostatic hypertrophy   . Osteoarthritis   . Chronic fatigue   . Lactose intolerance   . Melanoma    Past Surgical History  Procedure Date  . Rotator cuff repair   . Hernia repair   . Cataract extraction   . Tonsillectomy    Fam Hx and Soc Hx reviewed for relevance - no changes   Review of Systems  Constitutional: Negative.   HENT: Negative.   Eyes: Negative.   Respiratory: Negative.   Cardiovascular: Positive for chest pain.  Gastrointestinal:       See HPI  Genitourinary: Negative.   Musculoskeletal: Negative.   Neurological: Negative.   Hematological: Negative.        Objective:   Physical Exam  Vitals reviewed. Constitutional: He is oriented to person, place, and time. He appears well-developed and well-nourished.  HENT:  Head: Normocephalic and atraumatic.  Eyes: Conjunctivae are normal. Pupils are equal, round, and reactive to light. No scleral icterus.  Cardiovascular:  Normal rate and regular rhythm.   Pulmonary/Chest: Effort normal.  Abdominal: Soft. Bowel sounds are normal. He exhibits no distension and no mass. There is no tenderness. There is no rebound and no guarding.  Neurological: He is alert and oriented to person, place, and time.  Skin: Skin is warm and dry.       No jaundice  Psychiatric: He has a normal mood and affect. His behavior is normal. Thought content normal.          Assessment & Plan:  1. GI- patient with a complaint that suggests obstructive symptoms: possible pyloric stricture or loss of gastric compliance. This may be acid related vs. structual change, i.e. Stricture or infiltrative disease.  Plan - Increase zantac to 366m bid           GI consultation with probable EGD

## 2010-04-25 ENCOUNTER — Other Ambulatory Visit: Payer: Self-pay | Admitting: Internal Medicine

## 2010-04-29 ENCOUNTER — Other Ambulatory Visit: Payer: Self-pay | Admitting: Internal Medicine

## 2010-04-29 ENCOUNTER — Telehealth: Payer: Self-pay | Admitting: *Deleted

## 2010-04-29 DIAGNOSIS — Z77128 Contact with and (suspected) exposure to other hazards in the physical environment: Secondary | ICD-10-CM

## 2010-04-29 MED ORDER — BENAZEPRIL-HYDROCHLOROTHIAZIDE 10-12.5 MG PO TABS: 1.0000 | ORAL_TABLET | Freq: Every day | ORAL | Status: DC

## 2010-04-29 NOTE — Telephone Encounter (Signed)
refill

## 2010-04-30 NOTE — Letter (Signed)
Summary: New Patient letter  [facility]  [address] [place], [place] [address]   Phone: [phone]  Fax: [phone]       04/23/2010 MRN: [id number]  Ryan [name] [address] [place], [place]  [address]  [place]  Dear Mr. Bloyd,  Welcome to the Gastroenterology Division at [facility].    You are scheduled to see Dr.  Sharlett Iles on 05-28-10 at 9:30am on the 3rd floor at [facility], [place] [employer].  We ask that you try to arrive at our office 15 minutes prior to your appointment time to allow for check-in.  We would like you to complete the enclosed self-administered evaluation form prior to your visit and bring it with you on the day of your appointment.  We will review it with you.  Also, please bring a complete list of all your medications or, if you prefer, bring the medication bottles and we will list them.  Please bring your insurance card so that we may make a copy of it.  If your insurance requires a referral to see a specialist, please bring your referral form from your primary care physician.  Co-payments are due at the time of your visit and may be paid by cash, check or credit card.     Your office visit will consist of a consult with your physician (includes a physical exam), any laboratory testing he/she may order, scheduling of any necessary diagnostic testing (e.g. x-ray, ultrasound, CT-scan), and scheduling of a procedure (e.g. Endoscopy, Colonoscopy) if required.  Please allow enough time on your schedule to allow for any/all of these possibilities.    If you cannot keep your appointment, please call [phone] to cancel or reschedule prior to your appointment date.  This allows [place] the opportunity to schedule an appointment for another patient in need of care.  If you do not cancel or reschedule by 5 p.m. the business day prior to your appointment date, you will be charged a $50.00 late cancellation/no-show fee.    Thank you for  choosing [facility] for your medical needs.  We appreciate the opportunity to care for you.  Please visit [place] at our website  to learn more about our practice.                     Sincerely,                                                             The Gastroenterology Division

## 2010-05-05 ENCOUNTER — Other Ambulatory Visit: Payer: Self-pay | Admitting: Internal Medicine

## 2010-05-28 ENCOUNTER — Encounter: Payer: Self-pay | Admitting: Gastroenterology

## 2010-05-28 ENCOUNTER — Ambulatory Visit (INDEPENDENT_AMBULATORY_CARE_PROVIDER_SITE_OTHER): Payer: Medicare Other | Admitting: Gastroenterology

## 2010-05-28 ENCOUNTER — Other Ambulatory Visit (INDEPENDENT_AMBULATORY_CARE_PROVIDER_SITE_OTHER): Payer: Medicare Other

## 2010-05-28 DIAGNOSIS — R5383 Other fatigue: Secondary | ICD-10-CM

## 2010-05-28 DIAGNOSIS — K900 Celiac disease: Secondary | ICD-10-CM

## 2010-05-28 DIAGNOSIS — R5381 Other malaise: Secondary | ICD-10-CM

## 2010-05-28 DIAGNOSIS — Z77128 Contact with and (suspected) exposure to other hazards in the physical environment: Secondary | ICD-10-CM

## 2010-05-28 DIAGNOSIS — Z860100 Personal history of colon polyps, unspecified: Secondary | ICD-10-CM

## 2010-05-28 DIAGNOSIS — R1013 Epigastric pain: Secondary | ICD-10-CM

## 2010-05-28 DIAGNOSIS — M129 Arthropathy, unspecified: Secondary | ICD-10-CM

## 2010-05-28 DIAGNOSIS — K5730 Diverticulosis of large intestine without perforation or abscess without bleeding: Secondary | ICD-10-CM

## 2010-05-28 DIAGNOSIS — K9041 Non-celiac gluten sensitivity: Secondary | ICD-10-CM

## 2010-05-28 DIAGNOSIS — C8589 Other specified types of non-Hodgkin lymphoma, extranodal and solid organ sites: Secondary | ICD-10-CM

## 2010-05-28 DIAGNOSIS — Z8719 Personal history of other diseases of the digestive system: Secondary | ICD-10-CM

## 2010-05-28 LAB — CBC WITH DIFFERENTIAL/PLATELET
Basophils Relative: 0.4 % (ref 0.0–3.0)
Eosinophils Relative: 2.6 % (ref 0.0–5.0)
HCT: 40.4 % (ref 39.0–52.0)
Lymphs Abs: 2 10*3/uL (ref 0.7–4.0)
MCV: 92.7 fl (ref 78.0–100.0)
Monocytes Absolute: 0.5 10*3/uL (ref 0.1–1.0)
Monocytes Relative: 8.2 % (ref 3.0–12.0)
Platelets: 266 10*3/uL (ref 150.0–400.0)
RBC: 4.36 Mil/uL (ref 4.22–5.81)
WBC: 5.8 10*3/uL (ref 4.5–10.5)

## 2010-05-28 LAB — BASIC METABOLIC PANEL
BUN: 29 mg/dL — ABNORMAL HIGH (ref 6–23)
Calcium: 8.9 mg/dL (ref 8.4–10.5)
Creatinine, Ser: 0.9 mg/dL (ref 0.4–1.5)
GFR: 87.08 mL/min (ref >60.00)

## 2010-05-28 LAB — HEPATIC FUNCTION PANEL
Albumin: 3.7 g/dL (ref 3.5–5.2)
Total Bilirubin: 0.4 mg/dL (ref 0.3–1.2)

## 2010-05-28 LAB — SEDIMENTATION RATE: Sed Rate: 17 mm/hr (ref 0–22)

## 2010-05-28 LAB — VITAMIN B12: Vitamin B-12: 595 pg/mL (ref 211–911)

## 2010-05-28 LAB — IGA: IgA: 188 mg/dL (ref 68–378)

## 2010-05-28 LAB — TSH: TSH: 1.18 u[IU]/mL (ref 0.35–5.50)

## 2010-05-28 LAB — IBC PANEL
Iron: 53 ug/dL (ref 42–165)
Saturation Ratios: 12.1 % — ABNORMAL LOW (ref 20.0–50.0)
Transferrin: 312.8 mg/dL (ref 212.0–360.0)

## 2010-05-28 LAB — FERRITIN: Ferritin: 14.8 ng/mL — ABNORMAL LOW (ref 22.0–322.0)

## 2010-05-28 NOTE — Progress Notes (Signed)
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 Apparently for the last several years he is had worsening of all this complaints in January, February, and March. He describes numerous allergies, increasing fatigue, intermittent crampy abdominal pain without bowel regularity or rectal bleeding, early satiety, and chronic GERD for which he takes regular PPI and H2 blocker therapy. Not had previous endoscopic exam but has had a colonoscopy which was unremarkable several years ago by Dr. Collene Mares.  The patient is on Voltaren 75 mg twice a day. He has no history of pancreatitis, hepatitis, or known liver disease does use moderate amounts of alcohol daily. He takes Xanax on a regular basis at bedtime and antihypertensive medications. He has had a previous myocardial infarction no stenting. He also describes lactose intolerance, we intolerance, and repair of a previous inguinal hernia. Her review shows previous CT scan years ago which revealed diverticulosis coli and a left inguinal hernia. He sees Dr. Ellouise Newer was cardiology problems.  Patient has a one page typed view of his medical problems that was reviewed. Daily discusses stress time constraints. He does have a past history of anxiety and depression . He denies anorexia weight loss. 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 His rheumatologic problems revolve around arthralgias without joint swelling, stomatitis, skin rashes, etc. There no systemic complaints such as fever chills, but he has noticed some proximal muscle weakness.  I have reviewed this patient's present history, medical and surgical past history, allergies and medications.     ROS: The remainder of the 10 point ROS is negative, history is positive for BPH, low back pain, insomnia, mild edema lower  extremities, some urinary incontinence Apparently in  the past he has had multiple orthopedic procedures. Is concerned that he might have lead reason. Self diagnosed himself as having celiac disease. He has had previous removal of basal cell skin tremors. Her problems include elevated cholesterol, and hypertension.  Past Medical History  Diagnosis Date  . Hyperlipemia   . Hypertension   . Benign prostatic hypertrophy   . Osteoarthritis   . Chronic fatigue   . Lactose intolerance   . Melanoma   . Sleep disorder   . Personal history of colonic polyps 2004    hyperplastic Dr. Collene Mares  . Depression    Past Surgical History  Procedure Date  . Rotator cuff repair   . Hernia repair   . Cataract extraction   . Tonsillectomy     reports that he quit smoking about 14 years ago. He does not have any smokeless tobacco history on file. His alcohol and drug histories not on file. family history includes Heart disease in an unspecified family member and Prostate cancer in an unspecified family member. No Known Allergies      Physical Exam: General well developed well nourished patient in no acute distress, appearing their stated age Eyes PERRLA, no icterus, fundoscopic exam per opthamologist Neck supple, no adenopathy, no thyroid enlargement, no tenderness Chest clear to percussion and auscultation Heart no significant murmurs, gallops or rubs noted Abdomen no hepatosplenomegaly masses or tenderness, BS normal. Ventral hernia noted. There is no tenderness over his hernia and bowel sounds are normal.. Extremities no acute joint lesions, edema, phlebitis or evidence of cellulitis. Neurologic patient oriented x 3, cranial nerves intact, no focal neurologic deficits noted. Psychological mental status normal and normal affect.  Assessment and plan: Complicated patient is a minor complaint is fatigue and weakness with chronic IBS-type GI complaints. His symptomatology although suggest polymyalgia  rheumatica, and I have ordered CRP and sed rate, celiac antibodies, and anemia profile. Endoscopy and colonoscopy including small bowel biopsy and exam for H. pyloric has been scheduled with propofol sedation. He is to continue his other medications as per primary care. I also ordered a rheumatoid factor. She does have years of acid reflux and we need to exclude Barrett's mucosa. He has known diverticulosis coli and IBS. We will obtain small bowel biopsy to exclude celiac disease hospital associated musculoskeletal problems.  Encounter Diagnoses  Name Primary?  . Personal history of colonic polyps   . Fatigue   . Epigastric pain

## 2010-05-28 NOTE — Patient Instructions (Signed)
Please go to the basement today for your labs.  Your procedure has been scheduled for 06/17/2010, please follow the seperate instructions.  Your prescription(s) have been sent to you pharmacy.

## 2010-05-29 LAB — GLIA (IGA/G) + TTG IGA: Gliadin IgA: 8.7 U/mL (ref <20)

## 2010-05-29 LAB — RHEUMATOID FACTOR: Rhuematoid fact SerPl-aCnc: <10 IU/mL (ref <=14)

## 2010-06-03 ENCOUNTER — Telehealth: Payer: Self-pay | Admitting: *Deleted

## 2010-06-03 ENCOUNTER — Encounter: Payer: Medicare Other | Admitting: Gastroenterology

## 2010-06-03 NOTE — Telephone Encounter (Signed)
Message copied by Bernita Buffy on Wed Jun 03, 2010 12:45 PM ------      Message from: Sharlett Iles, DAVID      Created: Wed Jun 03, 2010  8:26 AM       Please call him and tell him his labs are all okay and it does not appear that he has celiac disease. He needs to continue to follow through with his procedures as scheduled.

## 2010-06-03 NOTE — Telephone Encounter (Signed)
Left message on patients machine all labs normal keep procedure date.

## 2010-06-12 ENCOUNTER — Telehealth: Payer: Self-pay | Admitting: Gastroenterology

## 2010-06-12 ENCOUNTER — Other Ambulatory Visit: Payer: Self-pay | Admitting: Internal Medicine

## 2010-06-12 MED ORDER — PEG-KCL-NACL-NASULF-NA ASC-C 100 G PO SOLR: 1.0000 | Freq: Once | ORAL | Status: AC

## 2010-06-12 MED ORDER — TRIAZOLAM 0.25 MG PO TABS: 0.2500 mg | ORAL_TABLET | Freq: Every evening | ORAL | Status: DC | PRN

## 2010-06-12 NOTE — Telephone Encounter (Signed)
rx resent

## 2010-06-17 ENCOUNTER — Encounter: Payer: Self-pay | Admitting: Gastroenterology

## 2010-06-17 ENCOUNTER — Ambulatory Visit (AMBULATORY_SURGERY_CENTER): Payer: Medicare Other | Admitting: Gastroenterology

## 2010-06-17 VITALS — BP 168/95 | HR 89 | Temp 97.5°F | Resp 15 | Ht 67.0 in | Wt 168.0 lb

## 2010-06-17 DIAGNOSIS — K2270 Barrett's esophagus without dysplasia: Secondary | ICD-10-CM

## 2010-06-17 DIAGNOSIS — K5730 Diverticulosis of large intestine without perforation or abscess without bleeding: Secondary | ICD-10-CM

## 2010-06-17 DIAGNOSIS — K2980 Duodenitis without bleeding: Secondary | ICD-10-CM

## 2010-06-17 DIAGNOSIS — Z86010 Personal history of colonic polyps: Secondary | ICD-10-CM

## 2010-06-17 DIAGNOSIS — K2990 Gastroduodenitis, unspecified, without bleeding: Secondary | ICD-10-CM

## 2010-06-17 DIAGNOSIS — K2970 Gastritis, unspecified, without bleeding: Secondary | ICD-10-CM

## 2010-06-17 DIAGNOSIS — K219 Gastro-esophageal reflux disease without esophagitis: Secondary | ICD-10-CM

## 2010-06-17 DIAGNOSIS — Z1211 Encounter for screening for malignant neoplasm of colon: Secondary | ICD-10-CM

## 2010-06-17 DIAGNOSIS — K900 Celiac disease: Secondary | ICD-10-CM

## 2010-06-17 MED ORDER — SODIUM CHLORIDE 0.9 % IV SOLN: 500.0000 mL | INTRAVENOUS | Status: DC

## 2010-06-17 NOTE — Patient Instructions (Signed)
Discharged instructions given with verbal understanding. Handouts on diverticulosis, hemorrhoids, and barretts and hiatal hernia given. Resume previous medications.

## 2010-06-18 ENCOUNTER — Telehealth: Payer: Self-pay | Admitting: *Deleted

## 2010-06-18 DIAGNOSIS — K2990 Gastroduodenitis, unspecified, without bleeding: Secondary | ICD-10-CM

## 2010-06-18 DIAGNOSIS — K2970 Gastritis, unspecified, without bleeding: Secondary | ICD-10-CM

## 2010-06-18 LAB — HELICOBACTER PYLORI SCREEN-BIOPSY: UREASE: NEGATIVE

## 2010-06-18 NOTE — Telephone Encounter (Signed)
Left message.

## 2010-06-19 NOTE — Op Note (Signed)
NAME:  Ryan [name], Ryan [name]                         ACCOUNT NO.:  [id number]   MEDICAL RECORD NO.:  [id number]                   PATIENT TYPE:  AMB   LOCATION:  ENDO                                 FACILITY:  [facility]   PHYSICIAN:  Nelwyn Salisbury, M.D.               DATE OF BIRTH:  04-16-1931   DATE OF PROCEDURE:  07/30/2002  DATE OF DISCHARGE:                                 OPERATIVE REPORT   PROCEDURE:  Colonoscopy with snare polypectomy x1 and cold biopsies x4.   ENDOSCOPIST:  Nelwyn Salisbury, M.D.   INSTRUMENT USED:  Olympus video colonoscope.   INDICATIONS FOR PROCEDURE:  A 77 year old white male with a history of  bright red blood per rectum and a personal history of colonic polyps in the  remote past.   PREPROCEDURE PREPARATION:  Informed consent was obtained from the patient  and the patient was  fasted for 8 hours prior to  the procedure and prepped  with a bottle of magnesium citrate and a gallon of GoLYTELY the  night prior  to the procedure.   PREPROCEDURE PHYSICAL:  The patient had stable vital signs. Neck supple.  Chest clear to auscultation, normal S1, S2. Abdomen soft with normoactive  bowel sounds.   DESCRIPTION OF PROCEDURE:  The patient was placed in the left lateral  decubitus position and sedated with 50 mg of Demerol and 5 mg of Versed  intravenously. Once sedation was adequate, the patient was maintained on low  flow oxygen and continuous cardiac monitoring.   The Olympus video colonoscope was advanced into the rectum to the cecum.  There was some residual stool  in the colon. Multiple  washings were done  and stool  was suctioned out. Scattered diverticulosis was noted with more  prominent changes in the sigmoid colon. Inspissated stool was noted impacted  in several of the sigmoid diverticula. A small  sessile polyp  was snared  from 15 cm. Three small sessiles polyps were biopsied with the core biopsy  forceps from the rectum. A small external hemorrhoid  was seen on withdrawal  of the scope. Retroflexion in the rectum revealed small  polyps near the  anal verge, biopsied as mentioned above, but no evidence of internal  hemorrhoids.   The patient tolerated the procedure well without complications. The  appendiceal orifice and ileocecal valve were visualized and photographed.  The cecum appeared healthy and without lesions.   IMPRESSION:  1. Small external hemorrhoid.  2. Pandiverticulosis with more prominent  changes in the sigmoid colon.  3. Three small sessile polyps biopsied from the rectum.  4. Small sessile polyp snared from 15 cm.    RECOMMENDATIONS:  1. Await pathology results.  2. Avoid nonsteroidals including aspirin  for the next  4 weeks.  3. Outpatient follow up in the next 2 weeks for further recommendations.  Nelwyn Salisbury, M.D.    JNM/MEDQ  D:  07/30/2002  T:  07/30/2002  Job:  [id number]   cc:   Suszanne Conners, M.D.  [address] [place]  [place]  [place] [address]  Fax: [phone]

## 2010-06-19 NOTE — Discharge Summary (Signed)
NAMEGULED, GAHAN NO.:  [id number]   MEDICAL RECORD NO.:  [id number]          PATIENT TYPE:  INP   LOCATION:  0962                         FACILITY:  [facility]   PHYSICIAN:  Jeanella Craze. Little, M.D. DATE OF BIRTH:  [birth year]   DATE OF ADMISSION:  06/29/2005  DATE OF DISCHARGE:  07/01/2005                                 DISCHARGE SUMMARY   DISCHARGE DIAGNOSES:  1.  Chest heaviness.  2.  Hypertension.  3.  Hyperlipidemia.  4.  Benign prostatic hypertrophy.  5.  Abnormal EKG with deep Q-wave inferiorly.  A 2-D echocardiogram pending.  6.  Cardiac catheterization with patent coronary arteries and normal left      ventricular function.   CONDITION ON DISCHARGE:  Improved.   PROCEDURES:  Jun 30, 2005:  Combined left heart catheterization by Dr.  Shelva Majestic.   DISCHARGE MEDICATIONS:  1.  Aspirin 81 mg daily.  2.  Vytorin 10/20 daily.  3.  Lotrel 5/10 one daily.  4.  Viagra as before.  5.  Diclofenac 75 mg twice a day.  6.  Flomax 0.4 mg daily.  7.  Proscar 5 mg daily.  8.  Prilosec 20 mg daily.  9.  Stop hydrochlorothiazide.  10. Stop Aceon.   DISCHARGE INSTRUCTIONS:  1.  Low fat, low salt diet.  2.  Wash right groin catheterization site with soap and water, call if any      bleeding, swelling, or drainage.  3.  Follow up with Dr. Claiborne Billings July 12, 2005 at 11:15 a.m.  Please note if the      2-D echocardiogram is abnormal our office will contact you.  Otherwise,      expect to hear results in the office.   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  Presented to the emergency room after talking to current primary  care, Dr. Everlene Farrier and Dr. Nyoka Cowden at urgent care secondary to left arm pain and  tingling and some chest discomfort.  Patient relates he had previously no  cardiac history except murmur but he has not felt well since March when he  had  a cold.  He recovered from the cold, but had decreased energy since that  time.  Two weeks ago he had some vertigo and cold sweating and he was seen  by Dr. Everlene Farrier.  Chest x-ray and CT were done.  There was no pneumonia.  He did  have some skipped beats on EKG.  He then recently was at the beach.  He  walked a lot that day, felt worse, drained, heavy all over.  No shortness of  breath.  Saw Dr. Nyoka Cowden this past Monday.  Was to be seen in the [facility]  office, but on Jun 29, 2005 developed chest heaviness, right arm aching  throughout the arm, and tingling.  After talking with Dr. Nyoka Cowden 77 was  called and he was brought to the [facility] ER.  He had mild chest heaviness on  arrival.  EKG with Q-wave in lead 3.   PAST MEDICAL HISTORY:  1.  Murmur, as stated.  He takes antibiotics prior to dentistry work.  2.  At age 26 he had a rapid heart rate, but no diagnosis.  His father was a      medical doctor and did not find the reason for the rapid heart rate.  3.  History of tonsillectomy.  4.  Rotator cuff repair.  5.  Knee surgery.  6.  Inguinal hernia repair.  7.  Hypertension.  8.  Arthritic pains.  9.  BPH.  10. Gastroesophageal reflux disease.  11. Hiatal hernia.  12. Tendinitis with injections in his hand.   ALLERGIES:  No known drug allergies.   OUTPATIENT MEDICATIONS:  1.  Viagra 100 p.r.n.  He had had none for 48 hours prior to admission.  2.  Diclofenac 75 one p.o. b.i.d. but he had not yet started.  3.  He had been taking 200 of Celebrex a day until they were gone and then      he was going to start the Diclofenac.  4.  Flomax 0.4 daily.  5.  Hydrochlorothiazide 12.5 daily.  6.  Prilosec p.r.n.  7.  Aceon 8 mg daily.  8.  Enteric-coated aspirin 81 daily.  9.  Proscar 5 daily.   FAMILY HISTORY:  Father died of ruptured aorta.  Mother died of old age.  No  coronary disease.  She did have a history of mini strokes.  One sister and a  half-brother with no coronary disease.    SOCIAL HISTORY:  Married.  His wife is also PhD and teaches at [employer].  Two children.  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  He is  quite determined to keep working and enjoys his work.  He does not use  tobacco for 10-12 years.  He does drink alcohol, wine with supper and at  times when he cannot sleep he will wake up at 3 in the morning.  He eats  healthy with fresh vegetables and fruit.  No exercise, though he does walk  and goes up and down the steps at the [employer].  Has an active  lifestyle.   REVIEW OF SYSTEMS:  See H&P.   PHYSICAL EXAMINATION AT DISCHARGE:  VITAL SIGNS:  Blood pressure 117/76,  pulse stable, oxygen saturation 94-96, temperature was afebrile.  NECK:  No JVD.  LUNGS:  Without rales.  HEART:  Regular rate and rhythm with a 1/6 systolic ejection murmur anterior  area of the left sternal border.  Right groin catheterization site stable.   LABORATORY DATA:  Hemoglobin 13.9, hematocrit 41.1, WBC 5, platelets 218,  neutrophils 61, lymphs 71, monos 6, eos 1.  Pro time 12.7, INR 0.9, PTT 27.  Heparin 1.08.  This remained stable.   Chemistries:  Sodium 140, potassium 3.6, chloride 107, CO2 25, glucose 96,  BUN 20, creatinine 1, calcium 8.6, total protein 6, albumin 3.7, AST 26, ALT  27, ALP 59, total bilirubin 0.8, magnesium 2.3.   Cardiac markers:  CK 80, 94, 53, MBs 1.5, 0.9, 1.9, troponin I 0.01-0.02,  all negative for MI.   Total cholesterol 223, triglycerides 150, HDL 43, and LDL 150.  TSH 1.63.  UA was clear.   Chest x-ray:  Subsegmental atelectasis at the left lung base, possible  hiatal hernia, heart size upper limits of normal.  EKG:  Sinus rhythm with Q-  waves in lead 3.  Follow-up on the 30th:  Sinus rhythm with PACs, inferior  infarction age undetermined secondary to significant Q-waves in 2, 3, and  aVF.   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 Tami Ribas on-call on Jun 29, 2005 after experiencing right arm  tingling and chest heaviness.  Presented  to the emergency room.  We put him on IV nitroglycerin, IV heparin.  We did  serial CK-MBs to rule out MI and he underwent cardiac catheterization on Jun 30, 2005.  Results:  Normal catheterization per Dr. Claiborne Billings.  Please see  dictated report for complete details.  Normal LV function, though Dr. Claiborne Billings  was concerned about the deep Q-waves inferiorly.  A 2-D echocardiogram was  planned which he had on the early morning of Jul 01, 2005.  Unfortunately,  those results were not back prior to discharge and patient will have the  results explained to him once he is in the office with Dr. Claiborne Billings.  He was  discharged home after seeing Dr. Claiborne Billings on Jul 01, 2005.  He will call if he  has questions.      Otilio Carpen. Ingold, N.P.    ______________________________  Jeanella Craze Little, M.D.    LRI/MEDQ  D:  07/01/2005  T:  07/02/2005  Job:  [id number]   cc:   Shelva Majestic, M.D.  Fax: [place], D.O. [place]  [address] [place], [place] [address]

## 2010-06-19 NOTE — Cardiovascular Report (Signed)
NAMEBRONSYN, SHAPPELL NO.:  [id number]   MEDICAL RECORD NO.:  [id number]          PATIENT TYPE:  INP   LOCATION:  5409                         FACILITY:  [facility]   PHYSICIAN:  Shelva Majestic, M.D.     DATE OF BIRTH:  [birth year]   DATE OF PROCEDURE:  DATE OF DISCHARGE:                              CARDIAC CATHETERIZATION   INDICATIONS:  [name] is a 77 year old gentleman who has a history  of hypertension, arthritis, BPH, GERD, hiatal hernia.  Over the last several  weeks, he has noticed decreased energy and episodes of chest discomfort.  He  had been seen by Dr. Osvaldo Angst and apparently an x-ray and CT scan were done,  unremarkable.  The patient was admitted to [facility] yesterday,  Jun 29, 2005, after experiencing chest pain with right arm discomfort.  The  patient had called Dr. Nyoka Cowden and presented to [facility] for  evaluation.  Of note, ECG did not show any acute ST-T changes, but did show  deeply inverted Q waves in leads II, III, and F suggesting inferior  infarction, age indeterminate.  Cardiac enzymes were negative x3, his CPK,  as well as troponin.  He was noted to have a total cholesterol of 223, LDL  cholesterol 150.  In light of his ECG changes suggesting the possibility of  age indeterminate inferior MI, definitive cardiac catheterization was  recommended.   PROCEDURE:  After premedication with Valium 5 mg intravenously, the patient  was prepped and draped in the usual fashion.  His right femoral artery was  punctured anteriorly and a 5-French sheath was inserted.  Diagnostic  catheterization was done utilizing 5-French Judkins-4 left and right  coronary catheters.  A pigtail catheter was used for biplane Cine left  ventriculography.  In light of the patient's hypertensive history, distal  aortography was also performed to exclude the possibility of renal vascular  etiology to his hypertension, to make certain there not evidence  for any  abdominal aortic aneurysm.  Hemostasis was obtained by direct manual  pressure.  The patient tolerated the procedure well.   HEMODYNAMIC DATA:  Central aortic pressure 144/75, left ventricular pressure  was 144/12, __________  20.   ANGIOGRAPHIC DATA:  The left main coronary artery was angiographically  normal bifurcating into a LAD and left circumflex system.   The LAD gave rise to two small proximal diagonal vessels prior to the first  large size septal perforating artery.  The third diagonal vessel was  moderate size.  The mid portion of the LAD had a small portion which dipped  intramyocardially, but did not appear to have any significant mid systolic  bridging or obstructive disease.  The LAD wrapped around the apex.   The circumflex vessel gave rise to a high marginal branch that was small  caliber normal, as well as two additional marginal vessels.   The right coronary artery was the dominant vessel that supplied the PDA and  two inferior LV branches and ended in the posterolateral coronary artery.  The RCA and its branches were angiographically normal.  Biplane selective angiography revealed normal LV contractility without  definitive focal segmental wall motion abnormality.   Distal aortography revealed an essential normal aortoiliac system.  The  renal arteries were widely patent without evidence for obstruction.   IMPRESSION:  1.  Normal left ventricular function.  2.  Essentially normal coronary arteries, with evidence for a very small      portion of mid left anterior descending dipping intramyocardially      without evidence for significant dynamic obstruction or fixed      obstructive disease.  3.  Normal aortoiliac system.   RECOMMENDATIONS:  Mr. Bastos ECG does show deep Q waves inferiorly with  preserved R waves.  A 2-D echo Doppler study will be performed for further  evaluation of myocardial mass/hypertrophy and then the cardio  thickening.           ______________________________  Shelva Majestic, M.D.     TK/MEDQ  D:  06/30/2005  T:  06/30/2005  Job:  [id number]   cc:   Marcellus Scott, MD   Adella Hare, M.D.  Fax: [phone]   JQGBEE FEO FHQR, M.D.  Fax: [phone]   Lillette Boxer. Dahlstedt, M.D.  Fax: [phone]   Octavia Heir, MD  Fax: [phone]   Tammi Sou, MD  Fax: [phone]

## 2010-06-23 ENCOUNTER — Encounter: Payer: Self-pay | Admitting: Gastroenterology

## 2010-06-25 ENCOUNTER — Encounter: Payer: Self-pay | Admitting: Gastroenterology

## 2010-06-30 ENCOUNTER — Other Ambulatory Visit: Payer: Self-pay | Admitting: Internal Medicine

## 2010-07-03 ENCOUNTER — Encounter: Payer: Self-pay | Admitting: *Deleted

## 2010-07-09 ENCOUNTER — Ambulatory Visit (INDEPENDENT_AMBULATORY_CARE_PROVIDER_SITE_OTHER): Payer: Medicare Other | Admitting: Gastroenterology

## 2010-07-09 ENCOUNTER — Encounter: Payer: Self-pay | Admitting: Gastroenterology

## 2010-07-09 VITALS — BP 134/82 | HR 76 | Ht 67.0 in | Wt 182.0 lb

## 2010-07-09 DIAGNOSIS — K219 Gastro-esophageal reflux disease without esophagitis: Secondary | ICD-10-CM | POA: Insufficient documentation

## 2010-07-09 DIAGNOSIS — M129 Arthropathy, unspecified: Secondary | ICD-10-CM

## 2010-07-09 DIAGNOSIS — K2270 Barrett's esophagus without dysplasia: Secondary | ICD-10-CM | POA: Insufficient documentation

## 2010-07-09 DIAGNOSIS — K30 Functional dyspepsia: Secondary | ICD-10-CM

## 2010-07-09 DIAGNOSIS — R1013 Epigastric pain: Secondary | ICD-10-CM

## 2010-07-09 DIAGNOSIS — K5730 Diverticulosis of large intestine without perforation or abscess without bleeding: Secondary | ICD-10-CM

## 2010-07-09 MED ORDER — TANDEM PLUS 162-115.2-1 MG PO CAPS: 1.0000 | ORAL_CAPSULE | Freq: Every day | ORAL | Status: DC

## 2010-07-09 MED ORDER — PREDNISONE 10 MG PO TABS: 10.0000 mg | ORAL_TABLET | Freq: Every day | ORAL | Status: AC

## 2010-07-09 NOTE — Progress Notes (Signed)
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 The patient  has diverticulosis, also Barrett's mucosa on the esophageal biopsy. He is intolerant to PPIs therapy but does well on Zantac 300 mg at bedtime. He continues with nocturnal regurgitation, early satiety, and chronic fatigue. Multiple labs were unremarkable except for low serum ferritin level and low iron saturation, but normal hemoglobin. CRP and sed rate were normal , rheumatoid factor was negative, but he continues with fatigue, arthralgias, swollen hands, and inability to do pottery she is his main hobby. He denies fever, chills, or other systemic complaints. Patient does take when necessary Voltaren, when necessary Xanax, and daily Norvasc and Proscar.  Current Medications, Allergies, Past Medical History, Past Surgical History, Family History and Social History were reviewed in [facility] record.   Assessment and plan: Chronic GERD with associated Barrett's mucosa. I have reviewed this finding with the patient and have suggested every three-year endoscopy. His symptomatology is most consistent with polymyalgia rheumatica and I placed him on prednisone 10 mg a day as a therapeutic trial, with followup in 3 weeks' time. Because of his iron deficiency I placed him on daily tandem. Technetium gastric emptying scan also ordered for possible delayed gastric emptying need for nocturnal metoclopramide. Encounter Diagnoses  Name Primary?  . Delayed gastric emptying Yes  . Barrett's esophagus

## 2010-07-09 NOTE — Patient Instructions (Signed)
Your Gastric Empty Scan is scheduled for 07/17/2010, please arrive at [facility] at 8:45am for a 9:00am appt. Have nothing to eat or drink after midnight and do not take your Zantac 48 hours prior.  Your prescription(s) have been sent to you pharmacy.  Please make an appt to come back and see Dr Sharlett Iles in 3 weeks.

## 2010-07-14 ENCOUNTER — Telehealth: Payer: Self-pay | Admitting: *Deleted

## 2010-07-14 NOTE — Telephone Encounter (Signed)
Patient informed

## 2010-07-14 NOTE — Telephone Encounter (Signed)
Saw the e-mail but I am trying to go through channels- negative operant conditioning of patients to call the regular line. Dr. Sharlett Iles thinks he may have Polymyalgia Rheumatica (PMR) with a normal sed rate but elevated CRP and is proposing a trial of prednisone at low dose. This is safe and if he has PMR he should see some improvement in symptoms over a short period of time.

## 2010-07-14 NOTE — Telephone Encounter (Signed)
Pt left vm - he emailed MD with question about prednisone. Have you received and/or responded to this yet? IF no, I will call to inquire, please let me know.

## 2010-07-17 ENCOUNTER — Encounter (HOSPITAL_COMMUNITY)
Admission: RE | Admit: 2010-07-17 | Discharge: 2010-07-17 | Disposition: A | Discharge status: Home / Self Care | Payer: Medicare Other | Source: Ambulatory Visit | Attending: Gastroenterology | Admitting: Gastroenterology

## 2010-07-17 DIAGNOSIS — R1013 Epigastric pain: Secondary | ICD-10-CM | POA: Insufficient documentation

## 2010-07-17 DIAGNOSIS — K3189 Other diseases of stomach and duodenum: Secondary | ICD-10-CM | POA: Insufficient documentation

## 2010-07-17 DIAGNOSIS — K30 Functional dyspepsia: Secondary | ICD-10-CM

## 2010-07-17 MED ORDER — TECHNETIUM TC 99M SULFUR COLLOID
2.0000 | Freq: Once | INTRAVENOUS | Status: AC | PRN
  Administered 2010-07-17: 2 via INTRAVENOUS

## 2010-07-18 DIAGNOSIS — K2970 Gastritis, unspecified, without bleeding: Secondary | ICD-10-CM

## 2010-07-18 DIAGNOSIS — K2990 Gastroduodenitis, unspecified, without bleeding: Secondary | ICD-10-CM

## 2010-07-20 ENCOUNTER — Telehealth: Payer: Self-pay | Admitting: *Deleted

## 2010-07-20 NOTE — Progress Notes (Signed)
See phone note

## 2010-07-20 NOTE — Telephone Encounter (Signed)
Notified pt's wife to have pt stop the prednisone. Wife stated understanding.

## 2010-07-20 NOTE — Telephone Encounter (Signed)
Message copied by Larina Bras on Mon Jul 20, 2010 10:24 AM ------      Message from: Sharlett Iles, DAVID R      Created: Mon Jul 20, 2010 10:19 AM       PLEASE CALL,,,NORMAL EMPTYING SCAN...PROGRESS REPORT.Marland KitchenMarland Kitchen

## 2010-07-20 NOTE — Telephone Encounter (Signed)
STOP PREDNISONE.Marland KitchenMarland Kitchen

## 2010-07-20 NOTE — Telephone Encounter (Signed)
Advised patient that gastric emptying scan was negative or normal. Patient states that he has had no change in symptoms. He still has arthritic complaints but no nausea etc.

## 2010-07-27 ENCOUNTER — Other Ambulatory Visit: Payer: Self-pay | Admitting: Internal Medicine

## 2010-07-27 NOTE — Telephone Encounter (Signed)
Refill request for Alprazolam [last refill 10/08/09 #[id number]]

## 2010-07-28 ENCOUNTER — Telehealth: Payer: Self-pay | Admitting: *Deleted

## 2010-07-28 NOTE — Telephone Encounter (Signed)
Fax from [facility] Alprazolam 0.5 mg SIG one tablet by mouth at bedtime.  Last refill 03/24/2010 Qty 100. Please Advise refills

## 2010-07-28 NOTE — Telephone Encounter (Signed)
Ok for refill x 5

## 2010-07-29 MED ORDER — ALPRAZOLAM 0.5 MG PO TABS: 0.5000 mg | ORAL_TABLET | Freq: Every evening | ORAL | Status: DC | PRN

## 2010-07-30 ENCOUNTER — Encounter: Payer: Self-pay | Admitting: Gastroenterology

## 2010-07-30 ENCOUNTER — Ambulatory Visit (INDEPENDENT_AMBULATORY_CARE_PROVIDER_SITE_OTHER): Payer: Medicare Other | Admitting: Gastroenterology

## 2010-07-30 VITALS — BP 136/78 | HR 80 | Ht 67.0 in | Wt 179.0 lb

## 2010-07-30 DIAGNOSIS — K219 Gastro-esophageal reflux disease without esophagitis: Secondary | ICD-10-CM

## 2010-07-30 MED ORDER — FAMOTIDINE 20 MG PO TABS: 20.0000 mg | ORAL_TABLET | ORAL | Status: DC | PRN

## 2010-07-30 NOTE — Patient Instructions (Addendum)
Take Pepcid as needed a rx has been sent to your pharmacy.  Come back October 25, 2012 for labs.

## 2010-07-30 NOTE — Progress Notes (Signed)
This is a 77 year old Caucasian male with chronic GERD who currently is doing better without PPI therapy. He does have a short segment of Barrett's mucosa. Biopsy showed no dysplasia. He also had recent colonoscopy which was unremarkable except for diverticulosis internal hemorrhoids. Recent gastric emptying scan was normal. Celiac antibodies and small bowel biopsy had been unremarkable, but the patient does seem to have gluten sensitivity. He has recurrent polyarticular arthritis, and is scheduled orthopedic evaluation. There is no history of gouty arthritis, and rheumatoid factor was negative. As part of his workup he has been found to have iron deficiency anemia, and he is on oral iron. Recent endoscopy showed a short segment of Barrett's because of that no erosive mucosal disease. He is on daily when necessary Xanax, Zantac, and regular Voltaren 75 mg twice a day. A brief trial of prednisone 10 mg a day did not alleviate his rheumatic symptomatology.  x. Medications, Allergies, Past Medical History, Past Surgical History, Family History and Social History were reviewed in [employer] record.  Pertinent Review of Systems Negative       Assessment and Plan: I GERD with associated Barrett's mucosa. The patient is fairly asymptomatic and uses when necessary H2 blocker therapy. We will repeat his CBC and iron studies in 3 months time. See him on a when necessary basis as needed. He is followed closely in primary care by Dr. Adella Hare.  Please copy her primary care physician, referring physician, and pertinent subspecialists. Encounter Diagnosis  Name Primary?  . Esophageal reflux Yes

## 2010-09-03 ENCOUNTER — Other Ambulatory Visit: Payer: Self-pay | Admitting: Internal Medicine

## 2010-10-11 ENCOUNTER — Other Ambulatory Visit: Payer: Self-pay | Admitting: Internal Medicine

## 2010-10-26 ENCOUNTER — Telehealth: Payer: Self-pay | Admitting: *Deleted

## 2010-10-26 ENCOUNTER — Other Ambulatory Visit (INDEPENDENT_AMBULATORY_CARE_PROVIDER_SITE_OTHER): Payer: Medicare Other

## 2010-10-26 DIAGNOSIS — Z79899 Other long term (current) drug therapy: Secondary | ICD-10-CM

## 2010-10-26 DIAGNOSIS — K219 Gastro-esophageal reflux disease without esophagitis: Secondary | ICD-10-CM

## 2010-10-26 DIAGNOSIS — D509 Iron deficiency anemia, unspecified: Secondary | ICD-10-CM

## 2010-10-26 LAB — IBC PANEL
Iron: 63 ug/dL (ref 42–165)
Saturation Ratios: 15.1 % — ABNORMAL LOW (ref 20.0–50.0)

## 2010-10-26 LAB — CBC WITH DIFFERENTIAL/PLATELET
Basophils Relative: 0.4 % (ref 0.0–3.0)
Eosinophils Relative: 3.1 % (ref 0.0–5.0)
Lymphocytes Relative: 28.6 % (ref 12.0–46.0)
MCV: 93.6 fl (ref 78.0–100.0)
Monocytes Absolute: 0.4 10*3/uL (ref 0.1–1.0)
Neutrophils Relative %: 60.4 % (ref 43.0–77.0)
Platelets: 221 10*3/uL (ref 150.0–400.0)
RBC: 4.32 Mil/uL (ref 4.22–5.81)
WBC: 5.7 10*3/uL (ref 4.5–10.5)

## 2010-10-26 LAB — FOLATE: Folate: 11.2 ng/mL (ref >5.9)

## 2010-10-26 LAB — VITAMIN B12: Vitamin B-12: 249 pg/mL (ref 211–911)

## 2010-10-26 LAB — FERRITIN: Ferritin: 15.8 ng/mL — ABNORMAL LOW (ref 22.0–322.0)

## 2010-10-26 NOTE — Telephone Encounter (Signed)
Pt aware labs due and he will come today.

## 2010-10-26 NOTE — Telephone Encounter (Signed)
Message copied by Sheral Flow on Mon Oct 26, 2010  9:35 AM ------      Message from: Bernita Buffy D      Created: Fri Jul 31, 2010  8:29 AM       Repeat cbc and anemia profile

## 2010-10-27 ENCOUNTER — Telehealth: Payer: Self-pay | Admitting: *Deleted

## 2010-10-27 NOTE — Telephone Encounter (Signed)
lmom for pt to call back.

## 2010-10-27 NOTE — Telephone Encounter (Signed)
Message copied by Lance Morin on Tue Oct 27, 2010  1:20 PM ------      Message from: Sable Feil      Created: Tue Oct 27, 2010 12:06 PM       Start B12 shots

## 2010-10-29 ENCOUNTER — Other Ambulatory Visit: Payer: Self-pay | Admitting: Internal Medicine

## 2010-10-30 MED ORDER — CYANOCOBALAMIN 1000 MCG/ML IJ SOLN: INTRAMUSCULAR | Status: DC

## 2010-10-30 NOTE — Telephone Encounter (Signed)
Spoke with pt to inform him of the need for B12 injections. Pt has a friend who is a Marine scientist and requests the med be called in. Requested multi dose vial with needles sufficient for 1 injection weekly x 3 weeks and then monthly x 1 year.

## 2010-12-06 ENCOUNTER — Other Ambulatory Visit: Payer: Self-pay | Admitting: Internal Medicine

## 2010-12-07 NOTE — Telephone Encounter (Signed)
Orders signed.

## 2010-12-14 ENCOUNTER — Other Ambulatory Visit: Payer: Self-pay | Admitting: *Deleted

## 2010-12-14 MED ORDER — TRIAZOLAM 0.25 MG PO TABS: 0.2500 mg | ORAL_TABLET | Freq: Every evening | ORAL | Status: DC | PRN

## 2010-12-15 ENCOUNTER — Encounter: Payer: Self-pay | Admitting: Endocrinology

## 2010-12-15 ENCOUNTER — Ambulatory Visit (INDEPENDENT_AMBULATORY_CARE_PROVIDER_SITE_OTHER): Payer: Medicare Other | Admitting: Endocrinology

## 2010-12-15 VITALS — BP 132/82 | HR 68 | Temp 98.1°F | Ht 67.5 in | Wt 181.0 lb

## 2010-12-15 DIAGNOSIS — I10 Essential (primary) hypertension: Secondary | ICD-10-CM

## 2010-12-15 MED ORDER — BENAZEPRIL-HYDROCHLOROTHIAZIDE 20-12.5 MG PO TABS: 1.0000 | ORAL_TABLET | Freq: Every day | ORAL | Status: DC

## 2010-12-15 NOTE — Progress Notes (Signed)
Subjective:    Patient ID: Ryan [name], male    DOB: Apr 08, 1931, 77 y.o.   MRN: [id number]  HPI Pt says he has noted his systolic bp to be elevated for approx 1 month.  No sob or headache. Past Medical History  Diagnosis Date  . Hyperlipemia   . Hypertension   . Benign prostatic hypertrophy   . Osteoarthritis   . Chronic fatigue   . Lactose intolerance   . Melanoma   . Sleep disorder   . Personal history of colonic polyps 2004    hyperplastic Dr. Collene Mares  . Gluten intolerance   . Barrett's esophagus   . Hiatal hernia   . Esophageal reflux   . Diverticulosis of colon (without mention of hemorrhage)     Past Surgical History  Procedure Date  . Rotator cuff repair     right  . Hernia repair   . Cataract extraction     bilateral  . Tonsillectomy   . Arthroscopic repair acl     right    History   Social History  . Marital Status: Married    Spouse Name: N/A    Number of Children: 2  . Years of Education: N/A   Occupational History  . retired    Social History Main Topics  . Smoking status: Former Smoker -- 35 years    Types: Pipe    Quit date: 02/23/1996  . Smokeless tobacco: Never Used  . Alcohol Use: Yes     wine with dinner daily  . Drug Use: No  . Sexually Active: Not on file   Other Topics Concern  . 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 ALPRAZolam (XANAX) 0.5 MG tablet Take 1 tablet (0.5 mg total) by mouth at bedtime as needed.  100 tablet  5  . amLODipine (NORVASC) 5 MG tablet take 1 tablet by mouth once daily  30 tablet  3  . aspirin 81 MG tablet Take 81 mg by mouth daily.        . calcium elemental as carbonate (TUMS ULTRA 1000) 400 MG  tablet Chew 1,000 mg by mouth at bedtime as needed.        . cyanocobalamin (,VITAMIN B-12,) 1000 MCG/ML injection Inject 1 ML into muscle once weekly for 3 weeks, then monthly for 1 year.  10 mL  1  . diclofenac (VOLTAREN) 75 MG EC tablet take 1 tablet by mouth twice a day with food if needed  60 tablet  1  . EPINEPHrine (EPIPEN JR) 0.15 MG/0.3ML injection Inject 0.15 mg into the muscle as needed.        . Eszopiclone (LUNESTA PO) Take 1 tablet by mouth at bedtime.        . finasteride (PROSCAR) 5 MG tablet Take 5 mg by mouth daily.        . fluticasone (FLONASE) 50 MCG/ACT nasal spray       . Omega-3 Fatty Acids (FISH OIL) 1000 MG CAPS Take by mouth.        . ranitidine (ZANTAC) 150 MG tablet Take 150 mg by mouth 3 (three) times daily.        . triazolam (HALCION) 0.25 MG tablet Take  1 tablet (0.25 mg total) by mouth at bedtime as needed.  30 tablet  5    No Known Allergies  Family History  Problem Relation Age of Onset  . Heart disease Father     MI 86  . Prostate cancer Brother     paternal uncle x 2    BP 132/82  Pulse 68  Temp(Src) 98.1 F (36.7 C) (Oral)  Ht 5' 7.5" (1.715 m)  Wt 181 lb (82.101 kg)  BMI 27.93 kg/m2  SpO2 95%  Review of Systems Denies chest pain    Objective:   Physical Exam VITAL SIGNS:  See vs page GENERAL: no distress Legs: trace edema  i reviewed electrocardiogram     Assessment & Plan:  HTN, with situational component

## 2010-12-15 NOTE — Patient Instructions (Addendum)
Increase benazepril/hctz, to 20/12.5, 1 daily Please see dr Linda Hedges for a follow-up appointment in aprox 1 month.

## 2011-01-14 ENCOUNTER — Encounter: Payer: Self-pay | Admitting: Internal Medicine

## 2011-01-14 ENCOUNTER — Ambulatory Visit (INDEPENDENT_AMBULATORY_CARE_PROVIDER_SITE_OTHER): Payer: Medicare Other | Admitting: Internal Medicine

## 2011-01-14 DIAGNOSIS — K2270 Barrett's esophagus without dysplasia: Secondary | ICD-10-CM

## 2011-01-14 DIAGNOSIS — G4700 Insomnia, unspecified: Secondary | ICD-10-CM

## 2011-01-14 DIAGNOSIS — I10 Essential (primary) hypertension: Secondary | ICD-10-CM

## 2011-01-14 DIAGNOSIS — M129 Arthropathy, unspecified: Secondary | ICD-10-CM

## 2011-01-14 NOTE — Progress Notes (Signed)
  Subjective:    Patient ID: Ryan [name], male    DOB: 26-May-1931, 77 y.o.   MRN: [id number]  HPI Mr. Tolbert presents with several issues.  His blood pressure was high and he saw Dr. Belenda Cruise who increased benazepril 65m which has worked well - 118/72 today.   Taking B12 injections - now monthly. Will need B12 level in about 4 months  Fatigue - is doing his usual activities and does take naps. He hasn't yet returned to really vigorous work to test his stamina since being on B12 replacement  Abdominal cramps - seems associated with acid reflux. No bloatings, flatus, irregular bowel habit or other signs of IBS.   Sleep - chronic insomnia. He is using lunesta and alprazolam on an alternating basis to avoid extinction. He can go for several days without anything. Discussed sleep hygiene and its importance.   Past Medical History  Diagnosis Date  . Hyperlipemia   . Hypertension   . Benign prostatic hypertrophy   . Osteoarthritis   . Chronic fatigue   . Lactose intolerance   . Melanoma   . Sleep disorder   . Personal history of colonic polyps 2004    hyperplastic Dr. MCollene Mares . Gluten intolerance   . Barrett's esophagus   . Hiatal hernia   . Esophageal reflux   . Diverticulosis of colon (without mention of hemorrhage)    Past Surgical History  Procedure Date  . Rotator cuff repair     right  . Hernia repair   . Cataract extraction     bilateral  . Tonsillectomy   . Arthroscopic repair acl     right   Family History  Problem Relation Age of Onset  . Heart disease Father     MI 765 . Prostate cancer Brother     paternal uncle x 2   History   Social History  . Marital Status: Married    Spouse Name: N/A    Number of Children: 2  . Years of Education: N/A   Occupational History  . retired    Social History Main Topics  . Smoking status: Former Smoker -- 35 years    Types: Pipe    Quit date: 02/23/1996  . Smokeless tobacco: Never Used  . Alcohol Use: Yes   wine with dinner daily  . Drug Use: No  . Sexually Active: Not on file   Other Topics Concern  . 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     Objective:   Physical Exam Vital signs reviewed - BP very stable General - older white man who is in no distress and appears fit. HEENT - C&S clear Cor - regular rate Pulm - normal respirtions, no increased WOB Neuro - A&O x 3, CN II-XII grossly normal, normal gait.        Assessment & Plan:

## 2011-01-16 NOTE — Assessment & Plan Note (Signed)
No complaint at today's visit, No limitations in function: manages ADLs, throws pots.

## 2011-01-16 NOTE — Assessment & Plan Note (Signed)
Discussed his med regimen: alternating alprazolam with lunesta. Emphasized the importance of sleep hygiene, especially extinction behavior.

## 2011-01-16 NOTE — Assessment & Plan Note (Signed)
BP Readings from Last 3 Encounters:  01/14/11 118/72  12/15/10 132/82  07/30/10 136/78   Good control on present medications

## 2011-01-16 NOTE — Assessment & Plan Note (Signed)
Symptoms are controlled w/ H2 blocker TID. Takes diclofenac on an as needed basis.

## 2011-02-04 ENCOUNTER — Telehealth: Payer: Self-pay | Admitting: Gastroenterology

## 2011-02-04 NOTE — Telephone Encounter (Signed)
Pt with hx of Chronic GERD, Internal Hemorrhoids, Normal GES, Gluten sensitivity, iron Def. Anemia, Colon 06/17/10 showing Diverticulosis, EGD 06/17/10 with Barrett's. Pt reports having increasing lower abdominal cramping and increasing reflux. The problems begin when he starts eating and he is eating small frequent meals to avoid the problem. He also reports a bulge in his abdomen that looks like a hernia, but his PCP states it's a weakening in his abdomen. Pt given an appt with Nicoletta Ba, PA on 02/08/11 at 10:30.

## 2011-02-08 ENCOUNTER — Ambulatory Visit (INDEPENDENT_AMBULATORY_CARE_PROVIDER_SITE_OTHER): Payer: Medicare Other | Admitting: Physician Assistant

## 2011-02-08 ENCOUNTER — Other Ambulatory Visit (INDEPENDENT_AMBULATORY_CARE_PROVIDER_SITE_OTHER): Payer: Medicare Other

## 2011-02-08 ENCOUNTER — Encounter: Payer: Self-pay | Admitting: Physician Assistant

## 2011-02-08 DIAGNOSIS — R109 Unspecified abdominal pain: Secondary | ICD-10-CM

## 2011-02-08 DIAGNOSIS — R1010 Upper abdominal pain, unspecified: Secondary | ICD-10-CM

## 2011-02-08 DIAGNOSIS — K439 Ventral hernia without obstruction or gangrene: Secondary | ICD-10-CM

## 2011-02-08 LAB — BASIC METABOLIC PANEL
BUN: 25 mg/dL — ABNORMAL HIGH (ref 6–23)
Calcium: 8.9 mg/dL (ref 8.4–10.5)
Creatinine, Ser: 1.1 mg/dL (ref 0.4–1.5)
GFR: 68.24 mL/min (ref >60.00)
Potassium: 4 mEq/L (ref 3.5–5.1)
Sodium: 141 mEq/L (ref 135–145)

## 2011-02-08 MED ORDER — CYANOCOBALAMIN 1000 MCG/ML IJ SOLN: 1000.0000 ug | INTRAMUSCULAR | Status: DC

## 2011-02-08 NOTE — Patient Instructions (Addendum)
Please go to the basement level to have your labs drawn.  Decrease the Voltaren to once daily. Continue the Pepcid AC to twice daily.   You have been scheduled for a CT scan of the abdomen and pelvis at [facility] ([address]---this is in the same building as Press [facility]).   You are scheduled on 02-09-2011 at 1:00 PM  You should arrive 15 minutes prior to your appointment time for registration. Please follow the written instructions below on the day of your exam:  WARNING: IF YOU ARE ALLERGIC TO IODINE/X-RAY DYE, PLEASE NOTIFY RADIOLOGY IMMEDIATELY AT [phone]! YOU WILL BE GIVEN A 13 HOUR PREMEDICATION PREP.  1) Do not eat or drink anything after 9:00 AM  (4 hours prior to your test) 2) You have been given 2 bottles of oral contrast to drink. The solution may taste               better if refrigerated, but do NOT add ice or any other liquid to this solution. Shake             well before drinking.    Drink 1 bottle of contrast @ 11:00 AM  (2 hours prior to your exam)  Drink 1 bottle of contrast @ 12:00 NOON  (1 hour prior to your exam)  You may take any medications as prescribed with a small amount of water except for the following: Metformin, Glucophage, Glucovance, Avandamet, Riomet, Fortamet, Actoplus Met, Janumet, Glumetza or Metaglip. The above medications must be held the day of the exam AND 48 hours after the exam.  The purpose of you drinking the oral contrast is to aid in the visualization of your intestinal tract. The contrast solution may cause some diarrhea. Before your exam is started, you will be given a small amount of fluid to drink. Depending on your individual set of symptoms, you may also receive an intravenous injection of x-ray contrast/dye. Plan on being at [facility] for 30 minutes or long, depending on the type of exam you are having performed.  If you have any questions regarding your exam or if you need to reschedule, you may call the  CT department at [phone] between the hours of 8:00 am and 5:00 pm, Monday-Friday.  ________________________________________________________________________

## 2011-02-08 NOTE — Progress Notes (Signed)
Agree with initial assessment and plans. Amy, please followup on CT. Thanks

## 2011-02-08 NOTE — Progress Notes (Signed)
Subjective:    Patient ID: Ryan [name], male    DOB: [birth year], 78 y.o.   MRN: [id number]  HPI Ryan [name] is a 78 year old male known to Dr. Sharlett Iles who has history of diverticular disease Barrett's esophagus, history of iron deficiency anemia chronic B12 deficiency gluten sensitivity and chronic GERD. He had last had endoscopy in May of 2012 which with biopsies positive for Barrett's without dysplasia. Colonoscopy was also done in May of 2012.  He comes in today with complaints of postprandial upper abdominal pain which has been present over the past several weeks. He had been maintained on omeprazole long term and had weaned himself off of that it was using Zantac 150 twice daily for control of his reflux after that. Most recently had not been on any acid suppressive therapy. He says he began having increased heartburn and indigestion and also epigastric pain particularly after dinner. He says he has been unable to any significant amount in the evenings without pain. After putting himself on Pepcid a.c. over the past 4-5 days he has had much less heartburn and indigestion and says this pain is better but is still present particularly after eating. He says he has a large bulge in his upper abdomen which she is concerned is a hernia though he's been told in the past that this is just a "weakening of the muscles". He feels pain in this area after meals. Not had any nausea or vomiting. He denies any early satiety symptoms rather states that he just hurts if he eats too much. His weight has been stable, he has not noted any melena or hematochezia. He does take Voltaren on a  regular basis for arthritis.  He does follow a gluten-free diet and also feels that he is lactose intolerant and avoids lactose.   Review of Systems  Constitutional: Negative.   HENT: Negative.   Eyes: Negative.   Respiratory: Negative.   Cardiovascular: Negative.   Gastrointestinal: Positive for abdominal pain.  Genitourinary:  Negative.   Musculoskeletal: Negative.   Neurological: Negative.   Hematological: Negative.   Psychiatric/Behavioral: Negative.    Outpatient Prescriptions Prior to Visit  Medication Sig Dispense Refill  . ALPRAZolam (XANAX) 0.5 MG tablet Take 1 tablet (0.5 mg total) by mouth at bedtime as needed.  100 tablet  5  . amLODipine (NORVASC) 5 MG tablet take 1 tablet by mouth once daily  30 tablet  3  . aspirin 81 MG tablet Take 81 mg by mouth daily.        . benazepril-hydrochlorthiazide (LOTENSIN HCT) 20-12.5 MG per tablet Take 1 tablet by mouth daily.  30 tablet  11  . calcium elemental as carbonate (TUMS ULTRA 1000) 400 MG tablet Chew 1,000 mg by mouth at bedtime as needed.        . diclofenac (VOLTAREN) 75 MG EC tablet take 1 tablet by mouth twice a day with food if needed  60 tablet  1  . EPINEPHrine (EPIPEN JR) 0.15 MG/0.3ML injection Inject 0.15 mg into the muscle as needed.        . finasteride (PROSCAR) 5 MG tablet Take 5 mg by mouth daily.        . fluticasone (FLONASE) 50 MCG/ACT nasal spray Place 2 sprays into the nose as needed.       . triazolam (HALCION) 0.25 MG tablet Take 1 tablet (0.25 mg total) by mouth at bedtime as needed.  30 tablet  5  . cyanocobalamin (,VITAMIN B-12,) 1000 MCG/ML injection Inject  1 ML into muscle once weekly for 3 weeks, then monthly for 1 year.  10 mL  1  . Eszopiclone (LUNESTA PO) Take 1 tablet by mouth at bedtime.        . Omega-3 Fatty Acids (FISH OIL) 1000 MG CAPS Take by mouth.       . ranitidine (ZANTAC) 150 MG tablet Take 150 mg by mouth 3 (three) times daily.         No Known Allergies     Objective:   Physical Exam well-developed older white male in no acute distress, pleasant. HEENT; non-traumatic normocephalic EOMI PERRLA sclera anicteric, Neck; supple no JVD, Cardiovascular; regular rate and rhythm with S1-S2 no murmur rub or gallop, Pulmonary ;clear bilaterally, Abdomen; soft, on standing he does have a visible old she in the epigastrium  which is soft and reducible, on lying he does appear to have a ventral hernia probably containing bowel which is reducible, there's no palpable mass or hepatosplenomegaly bowel sounds are active, Rectal; not done, Extremities; no clubbing cyanosis or edema ,Psych;mood and affect normal and appropriate        Assessment & Plan:  #78 year old male with history of chronic GERD and more recent diagnosis of Barrett's esophagus now presenting with increased heartburn and reflux symptoms as well as postprandial epigastric pain. Patient appears to have a ventral hernia which is probably symptomatic and causing his pain. Also would consider NSAID-induced gastropathy or ulcer disease given chronic Voltaren use.  He has improved significantly since altering his diet decreasing alcohol intake and starting on Pepcid a.c. twice daily. We'll continue the Pepcid a.c. twice daily for now, have advised continued moderation with his diet, in addition to gluten , and lactose restriction We'll schedule for CT scan of the abdomen and pelvis, to try to demonstrate ventral hernia - If this is positive he would benefit from surgical referral Decreased Voltaren to once daily dosing for now. Further plans pending results of CT

## 2011-02-09 ENCOUNTER — Other Ambulatory Visit: Payer: Medicare Other

## 2011-02-10 ENCOUNTER — Ambulatory Visit (INDEPENDENT_AMBULATORY_CARE_PROVIDER_SITE_OTHER)
Admission: RE | Admit: 2011-02-10 | Discharge: 2011-02-10 | Disposition: A | Discharge status: Home / Self Care | Payer: Medicare Other | Source: Ambulatory Visit | Attending: Physician Assistant | Admitting: Physician Assistant

## 2011-02-10 DIAGNOSIS — K439 Ventral hernia without obstruction or gangrene: Secondary | ICD-10-CM

## 2011-02-10 DIAGNOSIS — K449 Diaphragmatic hernia without obstruction or gangrene: Secondary | ICD-10-CM

## 2011-02-10 DIAGNOSIS — R1010 Upper abdominal pain, unspecified: Secondary | ICD-10-CM

## 2011-02-10 DIAGNOSIS — R109 Unspecified abdominal pain: Secondary | ICD-10-CM

## 2011-02-10 DIAGNOSIS — N281 Cyst of kidney, acquired: Secondary | ICD-10-CM

## 2011-02-10 HISTORY — DX: Diaphragmatic hernia without obstruction or gangrene: K44.9

## 2011-02-10 HISTORY — DX: Cyst of kidney, acquired: N28.1

## 2011-02-10 MED ORDER — IOHEXOL 300 MG/ML  SOLN
100.0000 mL | Freq: Once | INTRAMUSCULAR | Status: AC | PRN
  Administered 2011-02-10: 100 mL via INTRAVENOUS

## 2011-02-11 ENCOUNTER — Telehealth: Payer: Self-pay | Admitting: Physician Assistant

## 2011-02-11 NOTE — Telephone Encounter (Signed)
Message copied by Lance Morin on Thu Feb 11, 2011  9:56 AM ------      Message from: [place], [place] S      Created: Thu Feb 11, 2011  9:17 AM       Please call pt , and schedule him appt to come back in to discuss his Ct scan - can put him on Monday 1/14 in afternoon-thanks

## 2011-02-11 NOTE — Telephone Encounter (Signed)
Per Amy, scheduled pt to see her Monday, 02/15/11 at 1:30pm to discuss scans; pt stated understanding.

## 2011-02-15 ENCOUNTER — Ambulatory Visit (INDEPENDENT_AMBULATORY_CARE_PROVIDER_SITE_OTHER): Payer: Medicare Other | Admitting: Physician Assistant

## 2011-02-15 ENCOUNTER — Encounter: Payer: Self-pay | Admitting: Physician Assistant

## 2011-02-15 ENCOUNTER — Other Ambulatory Visit: Payer: Self-pay

## 2011-02-15 VITALS — BP 108/60 | HR 60 | Ht 67.0 in | Wt 181.0 lb

## 2011-02-15 DIAGNOSIS — R933 Abnormal findings on diagnostic imaging of other parts of digestive tract: Secondary | ICD-10-CM

## 2011-02-15 DIAGNOSIS — R109 Unspecified abdominal pain: Secondary | ICD-10-CM

## 2011-02-15 DIAGNOSIS — R1900 Intra-abdominal and pelvic swelling, mass and lump, unspecified site: Secondary | ICD-10-CM

## 2011-02-15 NOTE — Progress Notes (Signed)
Agree with Assessment and initial plans as outlined

## 2011-02-15 NOTE — Progress Notes (Signed)
Subjective:    Patient ID: Ryan [name], male    DOB: [birth year], 78 y.o.   MRN: [id number]  HPI Ryan [name] is a 78 year old white male known to Dr. Sharlett Iles, who I saw last week with complaints of postprandial upper abdominal pain increased heartburn and complaints of epigastric pain particularly after dinner. He has been eating smaller and smaller amount. He was also concerned about a bandage in his upper abdomen of NSAID that he was feeling pain in this area after eating as well.  Please see the note of 02/08/2011 for further details. CT scan of the abdomen and pelvis was ordered to assess for a ventral hernia. This was done on 02/09/2010 and does show a fat-containing ventral hernia as well as a right inguinal hernia. Unfortunately it also showed an extensive abnormal soft tissue surrounding the distal abdominal aorta and iliac vessels measuring 6.6 x 10.2 x 9.1 cm which is felt to be suspicious for low-grade lymphoma. He also has mild  prostatomegaly.  Patient has been feeling better over the past couple of days with adjustment in his meds and has not had any heartburn. He continues with complaints of generalized fatigue and postprandial abdominal discomfort.    Review of Systems  Constitutional: Positive for appetite change and fatigue.  HENT: Negative.   Eyes: Negative.   Respiratory: Negative.   Cardiovascular: Negative.   Gastrointestinal: Positive for abdominal pain.  Genitourinary: Negative.   Musculoskeletal: Positive for arthralgias.  Neurological: Negative.   Hematological: Negative.   Psychiatric/Behavioral: Negative.    Outpatient Prescriptions Prior to Visit  Medication Sig Dispense Refill  . ALPRAZolam (XANAX) 0.5 MG tablet Take 1 tablet (0.5 mg total) by mouth at bedtime as needed.  100 tablet  5  . amLODipine (NORVASC) 5 MG tablet take 1 tablet by mouth once daily  30 tablet  3  . aspirin 81 MG tablet Take 81 mg by mouth daily.        . benazepril-hydrochlorthiazide  (LOTENSIN HCT) 20-12.5 MG per tablet Take 1 tablet by mouth daily.  30 tablet  11  . calcium elemental as carbonate (TUMS ULTRA 1000) 400 MG tablet Chew 1,000 mg by mouth at bedtime as needed.        . cyanocobalamin (,VITAMIN B-12,) 1000 MCG/ML injection Inject 1 mL (1,000 mcg total) into the muscle every 30 (thirty) days.  30 mL  2  . diclofenac (VOLTAREN) 75 MG EC tablet take 1 tablet by mouth twice a day with food if needed  60 tablet  1  . EPINEPHrine (EPIPEN JR) 0.15 MG/0.3ML injection Inject 0.15 mg into the muscle as needed.        . famotidine (PEPCID) 10 MG tablet Take 10 mg by mouth 2 (two) times daily.        . finasteride (PROSCAR) 5 MG tablet Take 5 mg by mouth daily.        . fluticasone (FLONASE) 50 MCG/ACT nasal spray Place 2 sprays into the nose as needed.       . Red Yeast Rice Extract (RED YEAST RICE PO) Take 1 capsule by mouth daily.        . triazolam (HALCION) 0.25 MG tablet Take 1 tablet (0.25 mg total) by mouth at bedtime as needed.  30 tablet  5   No Known Allergies     Objective:   Physical Exam well-developed elderly white male, accompanied by his wife. Blood pressure 108/60 pulse 60 weight 181. Alert and oriented x3. Not further examined  today.        Assessment & Plan:  #78 78 year old male with complaints of fatigue and postprandial upper abdominal pain with CT finding of a retroperitoneal soft tissue mass concerning for a low-grade lymphoma. I discussed CT findings with the patient and his wife, we have arranged for oncology consultation with Dr. Beryle Beams later this week. I have also discussed with interventional radiologist Dr. Anselm Pancoast who feels that this soft tissue mass is approachable for biopsy. Interventional radiology will contact the patient and schedule the biopsy to be done within the next week. Stop baby Aspirin  #2 Ventral hernia, likely symptomatic. No bowel noted within the hernia. I advised him to proceed with oncology evaluation first, may  benefit from eventual ventral hernia repair. #3 GERD, improved. Continue twice daily Pepcid AC, minimizing NSAID usage and continue diet modification #4 Gluten intolerance, previous celiac markers negative and prior small bowel biopsy negative for sprue.

## 2011-02-15 NOTE — Patient Instructions (Signed)
Stop the baby aspirin now in anticipation of upcoming biopsy with Interventional Radiology.

## 2011-02-16 ENCOUNTER — Encounter: Payer: Self-pay | Admitting: Oncology

## 2011-02-16 ENCOUNTER — Encounter (HOSPITAL_COMMUNITY): Payer: Self-pay | Admitting: Pharmacy Technician

## 2011-02-16 ENCOUNTER — Other Ambulatory Visit: Payer: Self-pay | Admitting: Oncology

## 2011-02-16 ENCOUNTER — Telehealth: Payer: Self-pay | Admitting: Oncology

## 2011-02-16 DIAGNOSIS — R590 Localized enlarged lymph nodes: Secondary | ICD-10-CM

## 2011-02-16 HISTORY — DX: Localized enlarged lymph nodes: R59.0

## 2011-02-16 NOTE — Telephone Encounter (Signed)
Dx- Lymphadenopathy

## 2011-02-17 ENCOUNTER — Telehealth: Payer: Self-pay | Admitting: Oncology

## 2011-02-17 ENCOUNTER — Other Ambulatory Visit (HOSPITAL_BASED_OUTPATIENT_CLINIC_OR_DEPARTMENT_OTHER): Payer: Medicare Other

## 2011-02-17 ENCOUNTER — Encounter: Payer: Self-pay | Admitting: *Deleted

## 2011-02-17 ENCOUNTER — Ambulatory Visit: Payer: Medicare Other

## 2011-02-17 ENCOUNTER — Ambulatory Visit (HOSPITAL_BASED_OUTPATIENT_CLINIC_OR_DEPARTMENT_OTHER): Payer: Medicare Other | Admitting: Oncology

## 2011-02-17 VITALS — BP 137/84 | HR 66 | Temp 96.9°F | Ht 67.0 in | Wt 181.9 lb

## 2011-02-17 DIAGNOSIS — R590 Localized enlarged lymph nodes: Secondary | ICD-10-CM

## 2011-02-17 DIAGNOSIS — R599 Enlarged lymph nodes, unspecified: Secondary | ICD-10-CM

## 2011-02-17 LAB — CBC WITH DIFFERENTIAL/PLATELET
Basophils Absolute: 0 10*3/uL (ref 0.0–0.1)
Eosinophils Absolute: 0.1 10*3/uL (ref 0.0–0.5)
HGB: 11.3 g/dL — ABNORMAL LOW (ref 13.0–17.1)
MCV: 83.7 fL (ref 79.3–98.0)
MONO#: 0.6 10*3/uL (ref 0.1–0.9)
NEUT#: 3.2 10*3/uL (ref 1.5–6.5)
RDW: 13.6 % (ref 11.0–14.6)
lymph#: 1.5 10*3/uL (ref 0.9–3.3)

## 2011-02-17 LAB — CHCC SMEAR

## 2011-02-17 LAB — MORPHOLOGY: PLT EST: ADEQUATE

## 2011-02-17 NOTE — Progress Notes (Signed)
Pt set up for [facility] for 0830 02/23/11 with infusion/[facility] & [facility]/Lab/[facility]. Pt. notified.

## 2011-02-17 NOTE — Telephone Encounter (Signed)
appts made and printed for ct pet on 1/28 and visit on 1/30 and use np slot per dr g per pof from 1/16

## 2011-02-17 NOTE — Progress Notes (Signed)
New Patient Hematology-Oncology Evaluation   Ryan [name] [id number] 07/29/1931 78 y.o. 02/17/2011  CC:  Dr. Adella Hare          Dr. 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 He has been in overall good health except for chronic reflux esophagitis. Over the last 3 years he has had intermittent episodes of severe fatigue. He had a hospital admission for atypical chest pain. He tells me he went on a gluten-free diet in his chest symptoms resolved. Due to a flare up of his reflux symptoms he underwent both upper and lower endoscopy in may 2012. Upper endoscopy showed chronic duodenitis and intestinal metaplasia consistent with Barrett's esophagus. No dysplasia or malignancy. Colonoscopy showed severe diverticulosis in the sigmoid and descending colon no polyps or cancers. Internal hemorrhoids. Recently he had increasing upper abdominal pain worse after meals. A CT scan of the abdomen and pelvis was done on 02/10/2011 and I personally reviewed the images. There is bulky soft tissue abnormality over 6.6 x 10.2 x 9.1 cm surrounding the distal aorta although the level of the kidneys and encasing the iliac vessels. Prominent prostate gland but no nodules. No upper abdominal or retroperitoneal adenopathy and no splenomegaly. The soft tissue mass is up against the spine but he denies any change in chronic low back pain. No bladder or bowel dysfunction. No hematochezia or melena. Other than significant fatigue, he has not had any anorexia, fevers, or night sweats.  PMH: Past Medical History  Diagnosis Date  . Hyperlipemia   . Hypertension   . Benign prostatic hypertrophy   . Osteoarthritis   . Chronic fatigue   . Lactose intolerance   . Melanoma   . Sleep disorder   . Personal history of colonic polyps 2004    hyperplastic Dr. Collene Mares  . Gluten intolerance   . Barrett's  esophagus   . Hiatal hernia   . Esophageal reflux   . Diverticulosis of colon (without mention of hemorrhage)   . Periaortic lymphadenopathy 02/16/2011   degenerative arthritis of the spine Hiatus hernia; no hypertension or MI; no history of hepatitis yellow jaundice malaria mononucleosis; no thyroid disease; benign prostatic hyperplasia but no history of prostate cancer; no history of blood clots; no history of excessive bleeding after surgical procedures; no history of seizure or stroke.  Past Surgical History  Procedure Date  . Rotator cuff repair     right  . Hernia repair   . Cataract extraction     bilateral  . Tonsillectomy   . Arthroscopic repair acl     right    Allergies: Allergies  Allergen Reactions  . Molds & Smuts Other (See Comments)    Also dust mites causes sinus infections, h/a etc.    Medications: Medications Prior to Admission  Medication Sig Dispense Refill  . ALPRAZolam (XANAX) 0.5 MG tablet Take 0.5 mg by mouth at bedtime as needed. For sleep      . amLODipine (NORVASC) 5 MG tablet take 1 tablet by mouth once daily  30 tablet  3  . benazepril-hydrochlorthiazide (LOTENSIN HCT) 20-12.5 MG per tablet Take 1 tablet by mouth daily.  30 tablet  11  . Biotin 1000 MCG tablet Take 1,000 mcg by mouth daily.      . calcium elemental as carbonate (TUMS ULTRA 1000) 400 MG tablet Chew 1,000 mg by mouth at bedtime as needed.        Marland Kitchen  cyanocobalamin (,VITAMIN B-12,) 1000 MCG/ML injection Inject 1 mL (1,000 mcg total) into the muscle every 30 (thirty) days.  30 mL  2  . diclofenac (VOLTAREN) 75 MG EC tablet Take 75 mg by mouth daily as needed. For pain      . EPINEPHrine (EPIPEN JR) 0.15 MG/0.3ML injection Inject 0.15 mg into the muscle as needed. For allergic reaction      . famotidine (PEPCID) 10 MG tablet Take 10 mg by mouth daily as needed. For indigestion      . finasteride (PROSCAR) 5 MG tablet Take 5 mg by mouth daily.        . fluticasone (FLONASE) 50 MCG/ACT  nasal spray Place 2 sprays into the nose as needed. For allergies       . loratadine (CLARITIN) 10 MG tablet Take 10 mg by mouth daily as needed. For allergies      . Potassium Gluconate 550 MG TABS Take 550 mg by mouth daily as needed. For leg cramps       . Red Yeast Rice Extract (RED YEAST RICE PO) Take 1 capsule by mouth daily.        . triazolam (HALCION) 0.25 MG tablet Take 0.25 mg by mouth at bedtime as needed. For sleep      . aspirin 81 MG tablet Take 81 mg by mouth daily.         No current facility-administered medications on file as of 02/17/2011.    Social History: Retired Hotel manager. Married. Wife also a Pharmacist, hospital. Stop smoking 20 years ago. Drinks wine with meals and sometimes scotch. 2 daughters who are healthy  He has never used smokeless tobacco. He reports that he drinks alcohol. He reports that he does not use illicit drugs.  Family History: Family History  Problem Relation Age of Onset  . Heart disease Father     MI 8  . Prostate cancer Brother   . Prostate cancer Paternal Uncle   . Prostate cancer Paternal Uncle    other died at age 69. Father died at age 59 of a ruptured aneurysm question aorta 1 nonfocal sister 3/2 sisters none with any major medical problems but all currently deceased.  Review of Systems: Constitutional symptoms: See above HEENT: No sore throat Respiratory: No dyspnea or cough Cardiovascular:  No chest pain or palpitations Gastrointestinal ROS: See above Genito-Urinary ROS: See above  Hematological and Lymphatic: See above Musculoskeletal: See above Neurologic: No headache or change in vision Dermatologic: No rash or ecchymosis Remaining ROS negative.  Physical Exam: Blood pressure 137/84, pulse 66, temperature 96.9 F (36.1 C), temperature source Oral, height _0  (1.702 m), weight 181 lb 14.4 oz (82.509 kg). Wt Readings from Last 3 Encounters:  02/17/11 181 lb 14.4 oz (82.509 kg)  02/15/11 181 lb (82.101 kg)  02/08/11 183  lb 6.4 oz (83.19 kg)    General appearance: Well-nourished Caucasian man Head: Normal  Neck: Normal Lymph nodes: No cervical or supraclavicular adenopathy. Approximate 3 cm lymph node palpable in the right axilla. No left axillary adenopathy. No inguinal adenopathy. Breasts: No masses Lungs: Clear to auscultation resonant to percussion Heart: Regular cardiac rhythm no murmur no gallop Abdominal: Soft nontender no mass no organomegaly GU: Not examined Extremities: No edema no calf tenderness Neurologic: Mental status intact cranial nerves intact motor strength 5 over 5 reflexes 2+ symmetric sensation intact to PIN over the fingertips sensation intact to vibration by tuning fork exam pupils equal reactive to light optic discs sharp vessels  normal; Skin:No rash or ecchymosis   Lab Results: Lab Results  Component Value Date   WBC 5.5 02/17/2011   HGB 11.3* 02/17/2011   HCT 34.1* 02/17/2011   MCV 83.7 02/17/2011   PLT 291 02/17/2011     Chemistry      Component Value Date/Time   NA 141 02/08/2011 1157   K 4.0 02/08/2011 1157   CL 104 02/08/2011 1157   CO2 29 02/08/2011 1157   BUN 25* 02/08/2011 1157   CREATININE 1.1 02/08/2011 1157      Component Value Date/Time   CALCIUM 8.9 02/08/2011 1157   ALKPHOS 73 05/28/2010 1036   AST 25 05/28/2010 1036   ALT 20 05/28/2010 1036   BILITOT 0.4 05/28/2010 1036    LDH pending   Pathology:   Review of peripheral blood film:   Radiological Studies: See above    Impression and Plan: Extensive soft tissue mass in the pelvis encasing aortic and iliac vessels suspicious for lymphoma. Single right axillary lymph node palpable. Plan: The referring gastroenterologist is already set him up for a transcutaneous needle biopsy of the pelvic mass for Tuesday, January 15. I will do bilateral posterior iliac crest bone marrow biopsies on Thursday, January 17. I will order a CT scan of the chest and a PET scan. I discussed with him and his wife that sometimes  needle biopsies do not yield sufficient tissue for a firm lymphoma diagnosis and that we might need to do either an open biopsy of the pelvic mass or an incisional biopsy of the right axillary node. I will see him back when data is all available and outline a treatment program based on results.        Annia Belt, MD 02/17/2011, 6:44 PM

## 2011-02-18 ENCOUNTER — Other Ambulatory Visit: Payer: Self-pay | Admitting: Radiology

## 2011-02-19 LAB — PROTHROMBIN TIME: INR: 0.94 (ref <1.50)

## 2011-02-19 LAB — HEPATITIS PANEL, ACUTE
HCV Ab: NEGATIVE
Hep A IgM: NEGATIVE

## 2011-02-19 LAB — LACTATE DEHYDROGENASE: LDH: 166 U/L (ref 94–250)

## 2011-02-19 LAB — CMV IGM: CMV IgM: 0.3 (ref <0.90)

## 2011-02-19 LAB — COMPREHENSIVE METABOLIC PANEL
ALT: 14 U/L (ref 0–53)
AST: 17 U/L (ref 0–37)
Creatinine, Ser: 1.07 mg/dL (ref 0.50–1.35)
Total Bilirubin: 0.4 mg/dL (ref 0.3–1.2)

## 2011-02-19 LAB — SEDIMENTATION RATE: Sed Rate: 14 mm/hr (ref 0–16)

## 2011-02-19 LAB — CYTOMEGALOVIRUS ANTIBODY, IGG: Cytomegalovirus Ab-IgG: >6 — ABNORMAL HIGH (ref <0.90)

## 2011-02-23 ENCOUNTER — Ambulatory Visit (HOSPITAL_COMMUNITY)
Admission: RE | Admit: 2011-02-23 | Discharge: 2011-02-23 | Disposition: A | Discharge status: Home / Self Care | Payer: Medicare Other | Source: Ambulatory Visit | Attending: Physician Assistant | Admitting: Physician Assistant

## 2011-02-23 ENCOUNTER — Other Ambulatory Visit (HOSPITAL_COMMUNITY): Payer: Medicare Other

## 2011-02-23 ENCOUNTER — Other Ambulatory Visit: Payer: Self-pay | Admitting: Interventional Radiology

## 2011-02-23 ENCOUNTER — Encounter (HOSPITAL_COMMUNITY): Payer: Self-pay

## 2011-02-23 DIAGNOSIS — R1909 Other intra-abdominal and pelvic swelling, mass and lump: Secondary | ICD-10-CM | POA: Insufficient documentation

## 2011-02-23 DIAGNOSIS — I10 Essential (primary) hypertension: Secondary | ICD-10-CM | POA: Insufficient documentation

## 2011-02-23 DIAGNOSIS — E785 Hyperlipidemia, unspecified: Secondary | ICD-10-CM | POA: Insufficient documentation

## 2011-02-23 DIAGNOSIS — R933 Abnormal findings on diagnostic imaging of other parts of digestive tract: Secondary | ICD-10-CM

## 2011-02-23 LAB — CBC
Hemoglobin: 11.3 g/dL — ABNORMAL LOW (ref 13.0–17.0)
MCH: 27.1 pg (ref 26.0–34.0)
Platelets: 282 10*3/uL (ref 150–400)
RBC: 4.17 MIL/uL — ABNORMAL LOW (ref 4.22–5.81)
WBC: 5.4 10*3/uL (ref 4.0–10.5)

## 2011-02-23 LAB — PROTIME-INR
INR: 0.98 (ref 0.00–1.49)
Prothrombin Time: 13.2 seconds (ref 11.6–15.2)

## 2011-02-23 LAB — APTT: aPTT: 31 seconds (ref 24–37)

## 2011-02-23 MED ORDER — MIDAZOLAM HCL 5 MG/5ML IJ SOLN
INTRAMUSCULAR | Status: AC | PRN
  Administered 2011-02-23 (×3): 1 mg via INTRAVENOUS

## 2011-02-23 MED ORDER — SODIUM CHLORIDE 0.9 % IV SOLN
INTRAVENOUS | Status: DC
  Administered 2011-02-23: 1000 mL via INTRAVENOUS

## 2011-02-23 MED ORDER — MIDAZOLAM HCL 2 MG/2ML IJ SOLN
INTRAMUSCULAR | Status: AC
  Filled 2011-02-23: qty 4

## 2011-02-23 MED ORDER — FENTANYL CITRATE 0.05 MG/ML IJ SOLN
INTRAMUSCULAR | Status: AC
  Filled 2011-02-23: qty 4

## 2011-02-23 MED ORDER — FENTANYL CITRATE 0.05 MG/ML IJ SOLN
INTRAMUSCULAR | Status: AC | PRN
  Administered 2011-02-23 (×3): 50 ug via INTRAVENOUS

## 2011-02-23 NOTE — H&P (Signed)
Ryan [name] is an 78 y.o. male.   Chief Complaint: Retroperitoneal lymph node; possible lymphoma Hx melanoma  HPI: scheduled for Biopsy today  Past Medical History  Diagnosis Date  . Hyperlipemia   . Hypertension   . Benign prostatic hypertrophy   . Osteoarthritis   . Chronic fatigue   . Lactose intolerance   . Melanoma   . Sleep disorder   . Personal history of colonic polyps 2004    hyperplastic Dr. Collene Mares  . Gluten intolerance   . Barrett's esophagus   . Hiatal hernia   . Esophageal reflux   . Diverticulosis of colon (without mention of hemorrhage)   . Periaortic lymphadenopathy 02/16/2011    Past Surgical History  Procedure Date  . Rotator cuff repair     right  . Hernia repair   . Cataract extraction     bilateral  . Tonsillectomy   . Arthroscopic repair acl     right    Family History  Problem Relation Age of Onset  . Heart disease Father     MI 53  . Prostate cancer Brother   . Prostate cancer Paternal Uncle   . Prostate cancer Paternal Uncle    Social History:  reports that he quit smoking about 15 years ago. His smoking use included Pipe. He has never used smokeless tobacco. He reports that he drinks alcohol. He reports that he does not use illicit drugs.  Allergies:  Allergies  Allergen Reactions  . Molds & Smuts Other (See Comments)    Also dust mites causes sinus infections, h/a etc.    Medications Prior to Admission  Medication Sig Dispense Refill  . ALPRAZolam (XANAX) 0.5 MG tablet Take 0.5 mg by mouth at bedtime as needed. For sleep      . amLODipine (NORVASC) 5 MG tablet take 1 tablet by mouth once daily  30 tablet  3  . aspirin 81 MG tablet Take 81 mg by mouth daily.        . benazepril-hydrochlorthiazide (LOTENSIN HCT) 20-12.5 MG per tablet Take 1 tablet by mouth daily.  30 tablet  11  . Biotin 1000 MCG tablet Take 1,000 mcg by mouth daily.      . calcium elemental as carbonate (TUMS ULTRA 1000) 400 MG tablet Chew 1,000 mg by mouth at  bedtime as needed.        . diclofenac (VOLTAREN) 75 MG EC tablet Take 75 mg by mouth daily as needed. For pain      . famotidine (PEPCID) 10 MG tablet Take 10 mg by mouth daily as needed. For indigestion      . finasteride (PROSCAR) 5 MG tablet Take 5 mg by mouth daily.        Marland Kitchen loratadine (CLARITIN) 10 MG tablet Take 10 mg by mouth daily as needed. For allergies      . Potassium Gluconate 550 MG TABS Take 550 mg by mouth daily as needed. For leg cramps       . Red Yeast Rice Extract (RED YEAST RICE PO) Take 1 capsule by mouth daily.        . triazolam (HALCION) 0.25 MG tablet Take 0.25 mg by mouth at bedtime as needed. For sleep      . cyanocobalamin (,VITAMIN B-12,) 1000 MCG/ML injection Inject 1 mL (1,000 mcg total) into the muscle every 30 (thirty) days.  30 mL  2  . EPINEPHrine (EPIPEN JR) 0.15 MG/0.3ML injection Inject 0.15 mg into the muscle as needed.  For allergic reaction      . fluticasone (FLONASE) 50 MCG/ACT nasal spray Place 2 sprays into the nose as needed. For allergies        Medications Prior to Admission  Medication Dose Route Frequency Provider Last Rate Last Dose  . 0.9 %  sodium chloride infusion   Intravenous Continuous Lavonia Drafts, PA 20 mL/hr at 02/23/11 0801 1,000 mL at 02/23/11 0801    Results for orders placed during the hospital encounter of 02/23/11 (from the past 48 hour(s))  APTT     Status: Normal   Collection Time   02/23/11  7:47 AM      Component Value Range Comment   aPTT 31  24 - 37 (seconds)   CBC     Status: Abnormal   Collection Time   02/23/11  7:47 AM      Component Value Range Comment   WBC 5.4  4.0 - 10.5 (K/uL)    RBC 4.17 (*) 4.22 - 5.81 (MIL/uL)    Hemoglobin 11.3 (*) 13.0 - 17.0 (g/dL)    HCT 34.8 (*) 39.0 - 52.0 (%)    MCV 83.5  78.0 - 100.0 (fL)    MCH 27.1  26.0 - 34.0 (pg)    MCHC 32.5  30.0 - 36.0 (g/dL)    RDW 13.2  11.5 - 15.5 (%)    Platelets 282  150 - 400 (K/uL)   PROTIME-INR     Status: Normal   Collection Time    02/23/11  7:47 AM      Component Value Range Comment   Prothrombin Time 13.2  11.6 - 15.2 (seconds)    INR 0.98  0.00 - 1.49     No results found.  Review of Systems  Constitutional: Negative for fever and chills.  Respiratory: Negative for cough.   Cardiovascular: Negative for chest pain.  Gastrointestinal: Negative for nausea, vomiting and abdominal pain.  Neurological: Negative for headaches.    Blood pressure 137/78, pulse 71, temperature 97 F (36.1 C), temperature source Oral, resp. rate 20, height _0  (1.702 m), weight 182 lb (82.555 kg), SpO2 98.00%. Physical Exam  Constitutional: He is oriented to person, place, and time. He appears well-developed and well-nourished.  HENT:  Head: Normocephalic.  Eyes: EOM are normal.  Neck: Normal range of motion.  Cardiovascular: Normal rate and normal heart sounds.        Irreg rate  Respiratory: Effort normal and breath sounds normal. He has no wheezes.  GI: Soft. Bowel sounds are normal. There is no tenderness.  Musculoskeletal: Normal range of motion.  Neurological: He is alert and oriented to person, place, and time.  Skin: Skin is warm and dry.     Assessment/Plan Abd fullness x 2-3 yrs; retroperitoneal LN/ possible lymphoma Scheduled for LAN biopsy today in Radiology Pt aware of procedure benefits and risks and agreeable to proceed. Consent signed.  Jashun Puertas A 02/23/2011, 8:23 AM

## 2011-02-23 NOTE — Procedures (Signed)
CT fluoro guided rt RP mass 18 g core bxs(5) No comp Stable Full report in PACS

## 2011-02-23 NOTE — Progress Notes (Signed)
Band aid at biopsy site cdi

## 2011-02-25 ENCOUNTER — Ambulatory Visit: Payer: Medicare Other

## 2011-02-25 ENCOUNTER — Other Ambulatory Visit (HOSPITAL_COMMUNITY)
Admission: RE | Admit: 2011-02-25 | Discharge: 2011-02-25 | Disposition: A | Discharge status: Home / Self Care | Payer: Medicare Other | Source: Ambulatory Visit | Attending: Oncology | Admitting: Oncology

## 2011-02-25 ENCOUNTER — Ambulatory Visit (HOSPITAL_BASED_OUTPATIENT_CLINIC_OR_DEPARTMENT_OTHER): Payer: Medicare Other | Admitting: Oncology

## 2011-02-25 ENCOUNTER — Other Ambulatory Visit: Payer: Self-pay | Admitting: Oncology

## 2011-02-25 DIAGNOSIS — R599 Enlarged lymph nodes, unspecified: Secondary | ICD-10-CM

## 2011-02-25 DIAGNOSIS — R1909 Other intra-abdominal and pelvic swelling, mass and lump: Secondary | ICD-10-CM | POA: Insufficient documentation

## 2011-02-25 DIAGNOSIS — D539 Nutritional anemia, unspecified: Secondary | ICD-10-CM | POA: Insufficient documentation

## 2011-02-25 HISTORY — PX: BONE MARROW ASPIRATION: SHX1252

## 2011-02-25 LAB — DIFFERENTIAL
Lymphocytes Relative: 36 % (ref 12–46)
Lymphs Abs: 1.6 10*3/uL (ref 0.7–4.0)
Monocytes Relative: 11 % (ref 3–12)
Neutro Abs: 2.2 10*3/uL (ref 1.7–7.7)
Neutrophils Relative %: 48 % (ref 43–77)

## 2011-02-25 LAB — CBC
Hemoglobin: 10.6 g/dL — ABNORMAL LOW (ref 13.0–17.0)
Platelets: 292 10*3/uL (ref 150–400)
RBC: 3.97 MIL/uL — ABNORMAL LOW (ref 4.22–5.81)
WBC: 4.4 10*3/uL (ref 4.0–10.5)

## 2011-02-25 NOTE — Progress Notes (Signed)
Procedure note: Bilateral posterior iliac crest bone marrow biopsies with unilateral right aspirate done under 2% local lidocaine anesthesia. Technical difficulty obtaining an adequate sample from the left side and only a small specimen obtained. Good aspirate and biopsy from the right side. There were no complications. Indications: Staging evaluation for presumed lymphoma

## 2011-02-25 NOTE — Patient Instructions (Signed)
Pt discharged to home with spouse.  Pt instructed to assess bone marrow sites for bleeding.  If they are bleeding pt is to apply pressure, if bleeding persists then pt is to call the office or go to ER.  Pt instructed to take tylenol or ibuprofen for pain and call for questions/concerns.  Pt verbalized understanding. shk

## 2011-02-26 ENCOUNTER — Other Ambulatory Visit: Payer: Self-pay | Admitting: Oncology

## 2011-03-01 ENCOUNTER — Encounter (HOSPITAL_COMMUNITY): Payer: Self-pay

## 2011-03-01 ENCOUNTER — Encounter (HOSPITAL_COMMUNITY)
Admission: RE | Admit: 2011-03-01 | Discharge: 2011-03-01 | Disposition: A | Discharge status: Home / Self Care | Payer: Medicare Other | Source: Ambulatory Visit | Attending: Oncology | Admitting: Oncology

## 2011-03-01 ENCOUNTER — Ambulatory Visit (HOSPITAL_COMMUNITY)
Admission: RE | Admit: 2011-03-01 | Discharge: 2011-03-01 | Disposition: A | Discharge status: Home / Self Care | Payer: Medicare Other | Source: Ambulatory Visit | Attending: Oncology | Admitting: Oncology

## 2011-03-01 DIAGNOSIS — C8589 Other specified types of non-Hodgkin lymphoma, extranodal and solid organ sites: Secondary | ICD-10-CM | POA: Insufficient documentation

## 2011-03-01 DIAGNOSIS — K449 Diaphragmatic hernia without obstruction or gangrene: Secondary | ICD-10-CM | POA: Insufficient documentation

## 2011-03-01 DIAGNOSIS — K7689 Other specified diseases of liver: Secondary | ICD-10-CM | POA: Insufficient documentation

## 2011-03-01 DIAGNOSIS — N289 Disorder of kidney and ureter, unspecified: Secondary | ICD-10-CM | POA: Insufficient documentation

## 2011-03-01 DIAGNOSIS — K439 Ventral hernia without obstruction or gangrene: Secondary | ICD-10-CM | POA: Insufficient documentation

## 2011-03-01 DIAGNOSIS — J984 Other disorders of lung: Secondary | ICD-10-CM | POA: Insufficient documentation

## 2011-03-01 DIAGNOSIS — R590 Localized enlarged lymph nodes: Secondary | ICD-10-CM

## 2011-03-01 LAB — GLUCOSE, CAPILLARY: Glucose-Capillary: 81 mg/dL (ref 70–99)

## 2011-03-01 MED ORDER — FLUDEOXYGLUCOSE F - 18 (FDG) INJECTION
17.8000 | Freq: Once | INTRAVENOUS | Status: AC | PRN
  Administered 2011-03-01: 17.8 via INTRAVENOUS

## 2011-03-01 MED ORDER — IOHEXOL 300 MG/ML  SOLN
80.0000 mL | Freq: Once | INTRAMUSCULAR | Status: AC | PRN
  Administered 2011-03-01: 80 mL via INTRAVENOUS

## 2011-03-03 ENCOUNTER — Other Ambulatory Visit: Payer: Self-pay | Admitting: *Deleted

## 2011-03-03 ENCOUNTER — Encounter: Payer: Self-pay | Admitting: Oncology

## 2011-03-03 ENCOUNTER — Ambulatory Visit (HOSPITAL_BASED_OUTPATIENT_CLINIC_OR_DEPARTMENT_OTHER): Payer: Medicare Other | Admitting: Oncology

## 2011-03-03 ENCOUNTER — Telehealth: Payer: Self-pay | Admitting: Oncology

## 2011-03-03 VITALS — BP 135/80 | HR 70 | Temp 97.5°F | Ht 67.0 in | Wt 183.9 lb

## 2011-03-03 DIAGNOSIS — R590 Localized enlarged lymph nodes: Secondary | ICD-10-CM

## 2011-03-03 DIAGNOSIS — C8596 Non-Hodgkin lymphoma, unspecified, intrapelvic lymph nodes: Secondary | ICD-10-CM

## 2011-03-03 DIAGNOSIS — R599 Enlarged lymph nodes, unspecified: Secondary | ICD-10-CM

## 2011-03-03 HISTORY — DX: Non-Hodgkin lymphoma, unspecified, intrapelvic lymph nodes: C85.96

## 2011-03-03 MED ORDER — FAMOTIDINE 10 MG PO TABS: 10.0000 mg | ORAL_TABLET | Freq: Two times a day (BID) | ORAL | Status: DC

## 2011-03-03 MED ORDER — FAMOTIDINE 20 MG PO TABS: 20.0000 mg | ORAL_TABLET | Freq: Two times a day (BID) | ORAL | Status: DC

## 2011-03-03 NOTE — Telephone Encounter (Signed)
Pharm called and his old rx was 30m and somehow in his chart it was changed to 110mI have oked the change back to 2060m

## 2011-03-03 NOTE — Telephone Encounter (Signed)
Rad onc appt 2/1 and dr g appt 3/19,printed for pt     aom

## 2011-03-03 NOTE — Progress Notes (Signed)
Hematology and Oncology Follow Up Visit  Ryan [name] [id number] 14-Mar-1931 78 y.o. 03/03/2011 1:41 PM   Principle Diagnosis: Encounter Diagnoses  Name Primary?  . Periaortic lymphadenopathy   . 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 Please see recent full office consultation for full details of his past history. Briefly, he presented with unexplained upper and lower abdominal pain. Recent endoscopies have been unrevealing. A CT scan of the abdomen and pelvis unexpectedly showed a large soft tissue mass starting around the pelvic rim and extending inferiorly in the retroperitoneal area. Transcutaneous core needle biopsies were done by interventional radiology on January 21 and I have reviewed the slides with the pathologist. They show a monoclonal B-cell population of small lymphocytes in a diffuse pattern CD20 positive but negative for CD5, CD10, and cyclin D1 there is kappa light chain restriction. 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 Cytogenetic studies are pending. Iliac crest bone marrow aspiration and biopsy done January 24 shows a rare, minute, paratrabecular lymphoid aggregate but no flow cytometry does not show a monoclonal population of B cells in the marrow. CT scan of the chest shows no supraclavicular axillary or mediastinal adenopathy. A PET scan which I personally reviewed shows a hypermetabolic activity limited to the obvious disease on CT scan in the pelvic retroperitoneal area. Hemoglobin is 11 with the MCV 84 but I do not believe that the anemia is related to the lymphoma given the minimal bone marrow involvement. Serum LDH is normal at 166;  ESR 14 mm Hepatitis A, B., C., and HIV are negative. CMV and E BV titers are elevated against IgG but not IgM. I would stage him as ann ar[place] 1, FLIPI low risk (see  comments above about hemoglobin)   Medications: reviewed  Allergies:  Allergies  Allergen Reactions  . Molds & Smuts Other (See Comments)    Also dust mites causes sinus infections, h/a etc.    Review of Systems: Since visit here last week he has now developed a burning pain and a sense of swelling anteriorly over his right quadriceps area. At first he thought this was related to banging his leg against the bathtub but I believe we are starting to see some nerve impingement from the retroperitoneal mass on sacral nerve roots.   Physical Exam: Blood pressure 135/80, pulse 70, temperature 97.5 F (36.4 C), temperature source Oral, height _0  (1.702 m), weight 183 lb 14.4 oz (83.416 kg). Wt Readings from Last 3 Encounters:  03/03/11 183 lb 14.4 oz (83.416 kg)  02/23/11 182 lb (82.555 kg)  02/17/11 181 lb 14.4 oz (82.509 kg)    Exam of the right thigh shows no ecchymosis no palpable mass.  Lab Results: Lab Results  Component Value Date   WBC 4.4 02/25/2011   HGB 10.6* 02/25/2011   HCT 33.4* 02/25/2011   MCV 84.1 02/25/2011   PLT 292 02/25/2011     Chemistry      Component Value Date/Time   NA 140 02/17/2011 1121   K 4.1 02/17/2011 1121   CL 104 02/17/2011 1121   CO2 26 02/17/2011 1121   BUN 26* 02/17/2011 1121   CREATININE 1.07 02/17/2011 1121      Component Value Date/Time   CALCIUM 9.5 02/17/2011 1121   ALKPHOS 65 02/17/2011 1121   AST 17 02/17/2011 1121   ALT 14 02/17/2011 1121   BILITOT  0.4 02/17/2011 1121       Radiological Studies: See discussion above   Impression and Plan: 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 Given current localized disease process and the fact that #1 in general this is not a curable illness and #2 despite this that we have multiple good treatment options, after  discussion with one of our radiation oncologists, I am recommending an evaluation for local radiation. We discussed potential use of adjuvant Rituxan but I think if he gets a good result with radiation I am inclined just to follow him with serial exams and CT scans and initiate systemic therapy at any signsof first progression. Another potential option would be radio immunotherapy with either iodine or  yttrium labeled anti-CD 20 antibody. I am less inclined to use this given his age.   CC:.  Dr. Adella Hare; Dr. Verl Blalock; Dr. Eppie Gibson radiation oncology   Annia Belt, MD 1/30/20131:41 PM

## 2011-03-04 ENCOUNTER — Encounter: Payer: Self-pay | Admitting: *Deleted

## 2011-03-04 ENCOUNTER — Encounter: Payer: Self-pay | Admitting: Radiation Oncology

## 2011-03-04 DIAGNOSIS — C8590 Non-Hodgkin lymphoma, unspecified, unspecified site: Secondary | ICD-10-CM | POA: Insufficient documentation

## 2011-03-04 DIAGNOSIS — C439 Malignant melanoma of skin, unspecified: Secondary | ICD-10-CM | POA: Insufficient documentation

## 2011-03-05 ENCOUNTER — Ambulatory Visit
Admission: RE | Admit: 2011-03-05 | Discharge: 2011-03-05 | Disposition: A | Discharge status: Home / Self Care | Payer: Medicare Other | Source: Ambulatory Visit | Attending: Radiation Oncology | Admitting: Radiation Oncology

## 2011-03-05 ENCOUNTER — Encounter: Payer: Self-pay | Admitting: Radiation Oncology

## 2011-03-05 DIAGNOSIS — C8590 Non-Hodgkin lymphoma, unspecified, unspecified site: Secondary | ICD-10-CM

## 2011-03-05 DIAGNOSIS — C8596 Non-Hodgkin lymphoma, unspecified, intrapelvic lymph nodes: Secondary | ICD-10-CM | POA: Insufficient documentation

## 2011-03-05 DIAGNOSIS — R200 Anesthesia of skin: Secondary | ICD-10-CM

## 2011-03-05 DIAGNOSIS — Z510 Encounter for antineoplastic radiation therapy: Secondary | ICD-10-CM | POA: Insufficient documentation

## 2011-03-05 MED ORDER — DEXAMETHASONE 4 MG PO TABS: 2.0000 mg | ORAL_TABLET | Freq: Two times a day (BID) | ORAL | Status: AC

## 2011-03-05 NOTE — Progress Notes (Signed)
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 male who reports that 3 years he has had abdominal pain which responded to elimination of gluten and laxatives. However the abdominal pain returned and was crampy in nature months ago. As part of his workup, he was sent to a gastroenterologist and underwent a CT scan of his abdomen and pelvis with contrast. This demonstrated a 6.6 x 10.2 x 9.1 cm soft tissue mass in the low abdomen and upper pelvis. Mass was biopsied by CT guidance on 02/23/2011 and it revealed low-grade B-cell non-Hodgkin lymphoma as described above. The patient underwent a scan on 03/01/2011 which revealed this mass once again, measuring approximately 5.8 x 11.6 cm with an S U V max of 27.7.  There were no other sites of disease revealed on the scan.   Symptomatically, He also has had low back pain and fatigue for the past few months. Within the past week he has developed decreased sensation in his left foot and also burning in the anterior right thigh. He reports early satiety and cannot eat more than one cup of soup at that time. He denies any significant weight loss fevers or pruritus. He denies any palpable lumps or bumps in his body.   Patient underwent bone marrow biopsy in 02/25/2011. Bone marrow and peripheral blood showed no obvious evidence of disease.   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     PAST SURGICAL HISTORY:  Past Surgical History  Procedure Date  . Rotator cuff repair     right  . Hernia repair   . Cataract extraction     bilateral  . Tonsillectomy   . Arthroscopic repair acl     right  . Biopsy bowel 06/17/10    Duodenum ( duodenitis), Esophagus(intestinal Metaplasia Consistent with Barrett's Esophagus, No  Dysplasia  . Biopsy bowel 02/23/11    Retroperitonel Mass, Right - Non-Hodgkins Lymphoma  . Bone marrow aspiration 02/25/11    Bone Marrow, Aspirate, Clot, and Bilateral Bx, Right PIC     FAMILY HISTORY: family history includes Cancer in his brother and paternal uncles; Heart disease in his father; and Prostate cancer in his brother and paternal uncles.   SOCIAL HISTORY:  reports that he quit smoking about 15 years ago. His smoking use included Pipe. He has never used smokeless tobacco. He reports that he drinks alcohol. He reports that he does not use illicit drugs.   ALLERGIES: Molds & smuts   MEDICATIONS:  Current Outpatient Prescriptions  Medication Sig Dispense Refill  . ALPRAZolam (XANAX) 0.5 MG tablet Take 0.5 mg by mouth at bedtime as needed. For sleep      . amLODipine (NORVASC) 5 MG tablet take 1 tablet by mouth once daily  30  tablet  3  . aspirin 81 MG tablet Take 81 mg by mouth daily.        . benazepril-hydrochlorthiazide (LOTENSIN HCT) 20-12.5 MG per tablet Take 1 tablet by mouth daily.  30 tablet  11  . Biotin 1000 MCG tablet Take 1,000 mcg by mouth daily.      . calcium elemental as carbonate (TUMS ULTRA 1000) 400 MG tablet Chew 1,000 mg by mouth at bedtime as needed.        . cyanocobalamin (,VITAMIN B-12,) 1000 MCG/ML  injection Inject 1 mL (1,000 mcg total) into the muscle every 30 (thirty) days.  30 mL  2  . dexamethasone (DECADRON) 4 MG tablet Take 0.5 tablets (2 mg total) by mouth 2 (two) times daily with a meal.  30 tablet  0  . diclofenac (VOLTAREN) 75 MG EC tablet Take 75 mg by mouth daily as needed. For pain      . EPINEPHrine (EPIPEN JR) 0.15 MG/0.3ML injection Inject 0.15 mg into the muscle as needed. For allergic reaction      . famotidine (PEPCID) 20 MG tablet Take 1 tablet (20 mg total) by mouth 2 (two) times daily.  60 tablet  6  . finasteride (PROSCAR) 5 MG tablet Take 5 mg by mouth daily.        . fluticasone (FLONASE) 50 MCG/ACT nasal spray Place 2 sprays into the nose as needed. For allergies       . loratadine (CLARITIN) 10 MG tablet Take 10 mg by mouth daily as needed. For allergies      . Potassium Gluconate 550 MG TABS Take 550 mg by mouth daily as needed. For leg cramps       . Red Yeast Rice Extract (RED YEAST RICE PO) Take 1 capsule by mouth daily.        . triazolam (HALCION) 0.25 MG tablet Take 0.25 mg by mouth at bedtime as needed. For sleep        REVIEW OF SYSTEMS:  A comprehensive Review of systems was conducted and found to be positive and negative for that above.    PHYSICAL EXAM:  height is _0  (1.702 m) and weight is 185 lb 4.8 oz (84.052 kg). His oral temperature is 98.5 F (36.9 C). His blood pressure is 131/76 and his pulse is 73. His respiration is 20.   General: Alert and oriented, in no acute distress, he appears younger than his stated age 40: Head is normocephalic. Pupils are equally round and reactive to light. Extraocular movements are intact. Oropharynx is clear. Neck: Neck is supple, no palpable cervical or supraclavicular lymphadenopathy. Heart: Regular in rate and rhythm with no murmurs, rubs, or gallops. Chest: Clear to auscultation bilaterally, with no rhonchi, wheezes, or rales. Abdomen: Soft, nontender, nondistended, with no rigidity or guarding. He  has a ventral hernia. He has normoactive bowel sounds Pelvis: Soft and nontender. Extremities: No cyanosis or edema. Lymphatics: No concerning lymphadenopathy. Skin: No concerning lesions. Musculoskeletal: symmetric strength and muscle tone throughout. No tenderness to palpation throughout the spine and sacrum Neurologic: Cranial nerves II through XII are grossly intact. Speech is fluent. Coordination is intact. Psychiatric: Judgment and insight are intact. Affect is appropriate.    LABORATORY DATA:  Lab Results  Component Value Date   WBC 4.4 02/25/2011   HGB 10.6* 02/25/2011   HCT 33.4* 02/25/2011   MCV 84.1 02/25/2011   PLT 292 02/25/2011   CMP     Component Value Date/Time   NA  140 02/17/2011 1121   K 4.1 02/17/2011 1121   CL 104 02/17/2011 1121   CO2 26 02/17/2011 1121   GLUCOSE 93 02/17/2011 1121   BUN 26* 02/17/2011 1121   CREATININE 1.07 02/17/2011 1121   CALCIUM 9.5 02/17/2011 1121   PROT 6.5 02/17/2011 1121   ALBUMIN 4.3 02/17/2011 1121   AST 17 02/17/2011 1121   ALT 14 02/17/2011 1121   ALKPHOS 65 02/17/2011 1121   BILITOT 0.4 02/17/2011 1121   GFRNONAA 86.15 08/19/2009 1455   GFRAA 94 08/09/2007 1136    PATHOLOGY: As above   RADIOLOGY: As above   IMPRESSION/PLAN: This is a very pleasant 78 year old gentleman with an ECoG performance status of 1. He was recently diagnosed with low-grade B-cell non-Hodgkin's lymphoma. He has stage I disease with a bulky mass within the retroperitoneum.  He has met with Dr. Beryle Beams who spoke with the patient about systemic therapy; the patient understands that there is no single standard of care for treating limited stage low-grade non-Hodgkin's lymphoma. I would recommend some type of treatment considering that he is symptomatic from his mass. I think that radiotherapy would be a very reasonable approach to give him a good chance at local control and palliation. He understands that treatment would not be curative but that our goal would be to  provide local control and to improve his quality of life. I will probably treat this mass to approximately 36 gray at 1.8 gray per fraction.  I will treat the mass with margin, but I will not treat all of the regional nodes above and below the mass as I feel that this would significantly increase his chance of toxicity with minimal benefit.  I have scheduled simulation for the patient this afternoon and I anticipate starting his treatment in less than a week.  The patient is experiencing some new neurologic symptoms in addition to his low back pain; I believe that these are all probably secondary to the bulky mass in his retroperitoneum. I'm going to put him on a modest and temporary dose of dexamethasone to prevent the numbness in his left foot from progressing and also to hopefully address the sensory symptoms in his right thigh.  It was a pleasure meeting the patient today. We discussed the risks, benefits, and side effects of radiotherapy. No guarantees of treatment were given. A consent form was signed and placed in the patient's medical record. The patient is enthusiastic about proceeding with treatment. I look forward to participating in the patient's care.

## 2011-03-05 NOTE — Progress Notes (Signed)
Periaortic lymphadenopathy,Non-Hodgkins lymphomma of pelvic region   Married, 2 children, retired    Allergies:Molds& Smuts   Loss a[ppetite, eats soup and gets full with  abdominal pain, fatigued,

## 2011-03-05 NOTE — Progress Notes (Signed)
Simulation and treatment planning note  Patient was taken to the CT simulator and laid in the supine position and an alpha cradle. High-resolution CT axial imaging was obtained of the patient's abdomen and pelvis. I placed an isocenter within his retroperitoneal mass. Skin markings were made and he tolerated the procedure well.  Treatment planning note: I plan to treat the patient's abdominal/pelvic tumor with margin using an AP PA arrangement to 36 gray at 1.8 gray per fraction. MLCs will be used with custom blocks.

## 2011-03-05 NOTE — Progress Notes (Signed)
Encounter addended by: Deirdre Evener, RN on: 03/05/2011  6:07 PM<BR>     Documentation filed: Inpatient Patient Education

## 2011-03-05 NOTE — Progress Notes (Signed)
Please see the Nurse Progress Note in the MD Initial Consult Encounter for this patient. 

## 2011-03-12 ENCOUNTER — Ambulatory Visit
Admission: RE | Admit: 2011-03-12 | Discharge: 2011-03-12 | Disposition: A | Discharge status: Home / Self Care | Payer: Medicare Other | Source: Ambulatory Visit | Attending: Radiation Oncology | Admitting: Radiation Oncology

## 2011-03-12 ENCOUNTER — Ambulatory Visit: Payer: Medicare Other

## 2011-03-12 ENCOUNTER — Encounter: Payer: Self-pay | Admitting: Radiation Oncology

## 2011-03-12 NOTE — Progress Notes (Signed)
NARRATIVE: The patient was laid in the correct position on the treatment table for simulation verification. Portal imaging was obtained and I verified the fields and MLCs to be accurate. The patient tolerated the procedure well.

## 2011-03-15 ENCOUNTER — Ambulatory Visit
Admission: RE | Admit: 2011-03-15 | Discharge: 2011-03-15 | Disposition: A | Discharge status: Home / Self Care | Payer: Medicare Other | Source: Ambulatory Visit | Attending: Radiation Oncology | Admitting: Radiation Oncology

## 2011-03-15 VITALS — Wt 184.4 lb

## 2011-03-15 DIAGNOSIS — C8590 Non-Hodgkin lymphoma, unspecified, unspecified site: Secondary | ICD-10-CM

## 2011-03-15 NOTE — Progress Notes (Signed)
Weekly Management Note:  Site:Abdom/Pelvis Current Dose:  180  cGy Projected Dose: 3600  cGy  Narrative: The patient is seen today for routine under treatment assessment. CBCT/MVCT images/port films were reviewed. The chart was reviewed.   No new complaints today. He is not on allopurinol.  Physical Examination: There were no vitals filed for this visit..  Weight:  . Abdomen soft without masses or organomegaly. He does have a ventral hernia.  Impression: Tolerating radiation therapy well.  Plan: Continue radiation therapy as planned.

## 2011-03-15 NOTE — Progress Notes (Signed)
Encounter addended by: Andria Rhein, RN on: 03/15/2011 11:24 AM<BR>     Documentation filed: Inpatient Patient Education

## 2011-03-15 NOTE — Progress Notes (Signed)
Encounter addended by: Andria Rhein, RN on: 03/15/2011 11:06 AM<BR>     Documentation filed: Notes Section, Inpatient Document Flowsheet

## 2011-03-15 NOTE — Progress Notes (Signed)
Post sim ed completed; gave pt "Radiation and You" booklet. All questions answered. Pt will be scheduled to see dietician.

## 2011-03-16 ENCOUNTER — Ambulatory Visit
Admission: RE | Admit: 2011-03-16 | Discharge: 2011-03-16 | Disposition: A | Discharge status: Home / Self Care | Payer: Medicare Other | Source: Ambulatory Visit | Attending: Radiation Oncology | Admitting: Radiation Oncology

## 2011-03-17 ENCOUNTER — Ambulatory Visit
Admission: RE | Admit: 2011-03-17 | Discharge: 2011-03-17 | Disposition: A | Discharge status: Home / Self Care | Payer: Medicare Other | Source: Ambulatory Visit | Attending: Radiation Oncology | Admitting: Radiation Oncology

## 2011-03-18 ENCOUNTER — Ambulatory Visit
Admission: RE | Admit: 2011-03-18 | Discharge: 2011-03-18 | Disposition: A | Discharge status: Home / Self Care | Payer: Medicare Other | Source: Ambulatory Visit | Attending: Radiation Oncology | Admitting: Radiation Oncology

## 2011-03-19 ENCOUNTER — Ambulatory Visit
Admission: RE | Admit: 2011-03-19 | Discharge: 2011-03-19 | Disposition: A | Discharge status: Home / Self Care | Payer: Medicare Other | Source: Ambulatory Visit | Attending: Radiation Oncology | Admitting: Radiation Oncology

## 2011-03-22 ENCOUNTER — Ambulatory Visit
Admission: RE | Admit: 2011-03-22 | Discharge: 2011-03-22 | Disposition: A | Discharge status: Home / Self Care | Payer: Medicare Other | Source: Ambulatory Visit | Attending: Radiation Oncology | Admitting: Radiation Oncology

## 2011-03-23 ENCOUNTER — Ambulatory Visit
Admission: RE | Admit: 2011-03-23 | Discharge: 2011-03-23 | Disposition: A | Discharge status: Home / Self Care | Payer: Medicare Other | Source: Ambulatory Visit | Attending: Radiation Oncology | Admitting: Radiation Oncology

## 2011-03-24 ENCOUNTER — Ambulatory Visit
Admission: RE | Admit: 2011-03-24 | Discharge: 2011-03-24 | Disposition: A | Discharge status: Home / Self Care | Payer: Medicare Other | Source: Ambulatory Visit | Attending: Radiation Oncology | Admitting: Radiation Oncology

## 2011-03-24 ENCOUNTER — Encounter: Payer: Self-pay | Admitting: Radiation Oncology

## 2011-03-24 VITALS — BP 137/79 | HR 57 | Temp 98.2°F | Wt 181.2 lb

## 2011-03-24 DIAGNOSIS — C8590 Non-Hodgkin lymphoma, unspecified, unspecified site: Secondary | ICD-10-CM

## 2011-03-24 NOTE — Progress Notes (Signed)
[facility] Weekly Treatment Note    Name: Ryan [name] Date: 03/24/2011 MRN: [id number] DOB: September 12, 1931  Status: outpatient   Currently, the patient is at 1440/3600 cGy.  Narrative: Early well. He reports that he has some fogginess when he tries to concentrate. He is not sure if this is the steroid medication. He is taking 2 mg twice a day if the dexamethasone. He denies any GI upset. He is fatigued. He says his ears also feel stopped up. The numbness and discomfort in his thigh/ right lower extremity have improved significantly since consultation. The patient's chart and imaging have been checked.   MEDICATIONS: Current Outpatient Prescriptions  Medication Sig Dispense Refill  . ALPRAZolam (XANAX) 0.5 MG tablet Take 0.5 mg by mouth at bedtime as needed. For sleep      . amLODipine (NORVASC) 5 MG tablet take 1 tablet by mouth once daily  30 tablet  3  . aspirin 81 MG tablet Take 81 mg by mouth daily.        . benazepril-hydrochlorthiazide (LOTENSIN HCT) 20-12.5 MG per tablet Take 1 tablet by mouth daily.  30 tablet  11  . Biotin 1000 MCG tablet Take 1,000 mcg by mouth daily.      . calcium elemental as carbonate (TUMS ULTRA 1000) 400 MG tablet Chew 1,000 mg by mouth at bedtime as needed.        Marland Kitchen dexamethasone (DECADRON) 2 MG tablet Take 2 mg by mouth 2 (two) times daily with a meal.      . EPINEPHrine (EPIPEN JR) 0.15 MG/0.3ML injection Inject 0.15 mg into the muscle as needed. For allergic reaction      . famotidine (PEPCID) 20 MG tablet Take 1 tablet (20 mg total) by mouth 2 (two) times daily.  60 tablet  6  . finasteride (PROSCAR) 5 MG tablet Take 5 mg by mouth daily.        . fluticasone (FLONASE) 50 MCG/ACT nasal spray Place 2 sprays into the nose as needed. For allergies       . loratadine (CLARITIN) 10 MG tablet Take 10 mg by mouth daily as needed. For allergies      . Red Yeast Rice Extract (RED YEAST RICE PO) Take 1 capsule by mouth daily.         . triazolam (HALCION) 0.25 MG tablet Take 0.25 mg by mouth at bedtime as needed. For sleep      . diclofenac (VOLTAREN) 75 MG EC tablet Take 75 mg by mouth daily as needed. For pain         ALLERGIES: Molds & smuts   LABORATORY DATA:  Lab Results  Component Value Date   WBC 4.4 02/25/2011   HGB 10.6* 02/25/2011   HCT 33.4* 02/25/2011   MCV 84.1 02/25/2011   PLT 292 02/25/2011   Lab Results  Component Value Date   NA 140 02/17/2011   K 4.1 02/17/2011   CL 104 02/17/2011   CO2 26 02/17/2011   Lab Results  Component Value Date   ALT 14 02/17/2011   AST 17 02/17/2011   ALKPHOS 65 02/17/2011   BILITOT 0.4 02/17/2011        PHYSICAL EXAMINATION: weight is 181 lb 3.2 oz (82.192 kg). His temperature is 98.2 F (36.8 C). His blood pressure is 137/79 and his pulse is 57.      he is in no acute distress. He has no oral pharyngeal thrush    ASSESSMENT: He is  tolerating radiotherapy. Continue treatment as planned.    PLAN: Continue treatment as planned. I instituted a dexamethasone taper so that by the end of next week, on 04-02-11, he will take his final dose of dexamethasone. His ability to concentrate may be affected by the steroids - I told him this.

## 2011-03-24 NOTE — Progress Notes (Signed)
Denies any abdominal pain, nor N&V.  Has lost ~ 3 lbs since last week, but Mr Hubbert states he has a good appetite and the early saiety and heartburn he experienced prior to the start of Radiation has subsided.  Eating small frequent meals.  Voiding without diffficulty.  Reports "a lot of fatigue and spends most of his time in bed after treatment.  States his head feels fuzzy, but denies blurred vision or headaches but feels as if his ears are "stopped up"

## 2011-03-25 ENCOUNTER — Ambulatory Visit
Admission: RE | Admit: 2011-03-25 | Discharge: 2011-03-25 | Disposition: A | Discharge status: Home / Self Care | Payer: Medicare Other | Source: Ambulatory Visit | Attending: Radiation Oncology | Admitting: Radiation Oncology

## 2011-03-26 ENCOUNTER — Ambulatory Visit
Admission: RE | Admit: 2011-03-26 | Discharge: 2011-03-26 | Disposition: A | Discharge status: Home / Self Care | Payer: Medicare Other | Source: Ambulatory Visit | Attending: Radiation Oncology | Admitting: Radiation Oncology

## 2011-03-26 DIAGNOSIS — C8590 Non-Hodgkin lymphoma, unspecified, unspecified site: Secondary | ICD-10-CM

## 2011-03-26 NOTE — Progress Notes (Signed)
Pt c/o rattling in throat, chest, had sinus drainage in throat last night, sore throat, ears stopped up, no c/o chest pain stated by patient, listened to patients lung fields, all clear to ascultation, , has dry cough, no production, vitals wnl, request to see Md, 10:01 AM  .

## 2011-03-27 NOTE — Progress Notes (Signed)
[facility] Weekly Treatment Note    Name: Ryan [name] Date: 03/27/2011 MRN: [id number] DOB: 11/04/1931  Status: outpatient    Current dose: 1800  Current fraction:10  Planned dose:3600  Planned fraction:20   MEDICATIONS: Current Outpatient Prescriptions  Medication Sig Dispense Refill  . ALPRAZolam (XANAX) 0.5 MG tablet Take 0.5 mg by mouth at bedtime as needed. For sleep      . amLODipine (NORVASC) 5 MG tablet take 1 tablet by mouth once daily  30 tablet  3  . aspirin 81 MG tablet Take 81 mg by mouth daily.        . benazepril-hydrochlorthiazide (LOTENSIN HCT) 20-12.5 MG per tablet Take 1 tablet by mouth daily.  30 tablet  11  . Biotin 1000 MCG tablet Take 1,000 mcg by mouth daily.      . calcium elemental as carbonate (TUMS ULTRA 1000) 400 MG tablet Chew 1,000 mg by mouth at bedtime as needed.        Marland Kitchen dexamethasone (DECADRON) 2 MG tablet Take 2 mg by mouth 2 (two) times daily with a meal.      . diclofenac (VOLTAREN) 75 MG EC tablet Take 75 mg by mouth daily as needed. For pain      . EPINEPHrine (EPIPEN JR) 0.15 MG/0.3ML injection Inject 0.15 mg into the muscle as needed. For allergic reaction      . famotidine (PEPCID) 20 MG tablet Take 1 tablet (20 mg total) by mouth 2 (two) times daily.  60 tablet  6  . finasteride (PROSCAR) 5 MG tablet Take 5 mg by mouth daily.        . fluticasone (FLONASE) 50 MCG/ACT nasal spray Place 2 sprays into the nose as needed. For allergies       . loratadine (CLARITIN) 10 MG tablet Take 10 mg by mouth daily as needed. For allergies      . Red Yeast Rice Extract (RED YEAST RICE PO) Take 1 capsule by mouth daily.        . triazolam (HALCION) 0.25 MG tablet Take 0.25 mg by mouth at bedtime as needed. For sleep         ALLERGIES: Molds & smuts   LABORATORY DATA:  Lab Results  Component Value Date   WBC 4.4 02/25/2011   HGB 10.6* 02/25/2011   HCT 33.4* 02/25/2011   MCV 84.1 02/25/2011   PLT 292  02/25/2011   Lab Results  Component Value Date   NA 140 02/17/2011   K 4.1 02/17/2011   CL 104 02/17/2011   CO2 26 02/17/2011   Lab Results  Component Value Date   ALT 14 02/17/2011   AST 17 02/17/2011   ALKPHOS 65 02/17/2011   BILITOT 0.4 02/17/2011      NARRATIVE: Ryan [name] was seen today for weekly treatment management. The chart was checked and port films images were reviewed. Pt c/o of some rattling in throat/ chest. C/o some sinus drainage and congestion as well as a sore throat.  PHYSICAL EXAMINATION: vitals were not taken for this visit.    Lungs clear bilaterally; could not visulaize oropharynx well secondary to gag reflex   ASSESSMENT: Patient tolerating treatments well. Appears to have a cold/ upper viral infection.   PLAN: Continue treatment as planned. Pt to take OTC meds with decongestant this weekend. Discussed possible CXR/ further workup if symptoms worsened.

## 2011-03-29 ENCOUNTER — Encounter: Payer: Self-pay | Admitting: Radiation Oncology

## 2011-03-29 ENCOUNTER — Ambulatory Visit
Admission: RE | Admit: 2011-03-29 | Discharge: 2011-03-29 | Disposition: A | Discharge status: Home / Self Care | Payer: Medicare Other | Source: Ambulatory Visit | Attending: Radiation Oncology | Admitting: Radiation Oncology

## 2011-03-29 DIAGNOSIS — C8590 Non-Hodgkin lymphoma, unspecified, unspecified site: Secondary | ICD-10-CM

## 2011-03-29 DIAGNOSIS — C8596 Non-Hodgkin lymphoma, unspecified, intrapelvic lymph nodes: Secondary | ICD-10-CM

## 2011-03-29 NOTE — Progress Notes (Signed)
   Weekly Management Note Current Dose:   1980cGy  Projected Dose:  3600cGy   Narrative:  The patient presents for routine under treatment assessment.  CBCT/MVCT images/Port film x-rays were reviewed.  The chart was checked. He continues to have cold-like symptoms for which she saw Dr. Lisbeth Renshaw 3 days ago. Overall, the patient feels fairly well. He denies any shortness of breath. He reports some wheezing but he says that this is consistent with how he feels when he has upper respiratory infections. He also reports a cough and congestion. He denies any fevers or chills.  Physical Findings: Weight: 182 lb 14.4 oz (82.963 kg). There is no thrush his oropharynx . He has no obvious rhonchi wheezes or rales to auscultation of his chest. His heart is regular in rate and rhythm.  Impression:  The patient is tolerating radiotherapy.  Plan:  Continue radiotherapy as planned. I advised the patient to contact me if he develops a low-grade fever above 100.5 or any shortness of breath, in which case I would get a chest x-ray. He is tapering on his dexamethasone with plans to complete it this Friday, March 1.

## 2011-03-29 NOTE — Progress Notes (Signed)
Patient presents to the clinic today accompanied by his wife for an under treat visit with Dr. Isidore Moos. Patient is alert and oriented to person, place, and time. No distress noted. Steady gait noted. Pleasant affect noted. Patient reports that his ears are stopped up, wheezing in his chest, and a productive cough. Patient reports he was seen by Dr. Lisbeth Renshaw on Friday who concerns these symptoms were related to a viral infection. Reported all findings to Dr. Isidore Moos.

## 2011-03-30 ENCOUNTER — Ambulatory Visit
Admission: RE | Admit: 2011-03-30 | Discharge: 2011-03-30 | Disposition: A | Discharge status: Home / Self Care | Payer: Medicare Other | Source: Ambulatory Visit | Attending: Radiation Oncology | Admitting: Radiation Oncology

## 2011-03-31 ENCOUNTER — Ambulatory Visit: Admission: RE | Admit: 2011-03-31 | Payer: Medicare Other | Source: Ambulatory Visit | Admitting: Radiation Oncology

## 2011-03-31 ENCOUNTER — Other Ambulatory Visit: Payer: Self-pay | Admitting: Radiation Oncology

## 2011-03-31 ENCOUNTER — Telehealth: Payer: Self-pay | Admitting: *Deleted

## 2011-03-31 ENCOUNTER — Ambulatory Visit (HOSPITAL_COMMUNITY)
Admission: RE | Admit: 2011-03-31 | Discharge: 2011-03-31 | Disposition: A | Discharge status: Home / Self Care | Payer: Medicare Other | Source: Ambulatory Visit | Attending: Radiation Oncology | Admitting: Radiation Oncology

## 2011-03-31 ENCOUNTER — Ambulatory Visit
Admission: RE | Admit: 2011-03-31 | Discharge: 2011-03-31 | Disposition: A | Discharge status: Home / Self Care | Payer: Medicare Other | Source: Ambulatory Visit | Attending: Radiation Oncology | Admitting: Radiation Oncology

## 2011-03-31 ENCOUNTER — Encounter: Payer: Self-pay | Admitting: Radiation Oncology

## 2011-03-31 DIAGNOSIS — C8596 Non-Hodgkin lymphoma, unspecified, intrapelvic lymph nodes: Secondary | ICD-10-CM

## 2011-03-31 DIAGNOSIS — C8590 Non-Hodgkin lymphoma, unspecified, unspecified site: Secondary | ICD-10-CM

## 2011-03-31 DIAGNOSIS — R059 Cough, unspecified: Secondary | ICD-10-CM

## 2011-03-31 DIAGNOSIS — R05 Cough: Secondary | ICD-10-CM | POA: Insufficient documentation

## 2011-03-31 DIAGNOSIS — R062 Wheezing: Secondary | ICD-10-CM | POA: Insufficient documentation

## 2011-03-31 DIAGNOSIS — C8589 Other specified types of non-Hodgkin lymphoma, extranodal and solid organ sites: Secondary | ICD-10-CM | POA: Insufficient documentation

## 2011-03-31 DIAGNOSIS — K449 Diaphragmatic hernia without obstruction or gangrene: Secondary | ICD-10-CM | POA: Insufficient documentation

## 2011-03-31 NOTE — Progress Notes (Signed)
HERE TODAY FOR CONGESTION AND PRODUCTIVE COUGH, PALE YELLOW TO GREEN IN COLOR.  HAVING A LOT OF WHEEZING OVER THE PAST WEEK.  RARS ARE CLOGGED , NO PAIN

## 2011-03-31 NOTE — Telephone Encounter (Signed)
Called patient to inform him that todays' Chest Xray was clear as reported by Dr. Isidore Moos. Mr. Mcgilvery has been experiencing "nasal congestion, stopped up ears and cough with some wheezing." will reassess in the am as relayed to Mr. Zanni.

## 2011-03-31 NOTE — Progress Notes (Signed)
   Weekly Management Note Current Dose:  2340 cGy  Projected Dose: 3600  cGy   Narrative:  The patient presents for routine under treatment assessment.  CBCT/MVCT images/Port film x-rays were reviewed.  The chart was checked. His upper respiratory symptoms are slightly worse than 2 days ago. He is wheezing more. Still congested. Not febrile. He has some chronic gastroesophageal reflux. Occasionally he feels as though he might aspirate and this has been particularly noticeable over the past week.  Physical Findings: Weight:  .  Blood pressure 116/80 pulse 66 respiratory rate 18 temperature 98 degrees oxygen saturation 98% on room air. He is nontoxic appearing. He has decreased breath sounds at the bases to auscultation, but this is a fairly soft physical finding.  Impression:  The patient is tolerating radiotherapy. He had symptoms of an upper respiratory infection, but due to the fact that he has been on dexamethasone and has had a history of possible aspiration, I think we should go ahead and get a chest x-ray today.  Plan:  Continue radiotherapy as planned. Chest x-ray today. I will call him with the results.

## 2011-04-01 ENCOUNTER — Ambulatory Visit
Admission: RE | Admit: 2011-04-01 | Discharge: 2011-04-01 | Disposition: A | Discharge status: Home / Self Care | Payer: Medicare Other | Source: Ambulatory Visit | Attending: Radiation Oncology | Admitting: Radiation Oncology

## 2011-04-01 ENCOUNTER — Other Ambulatory Visit: Payer: Self-pay | Admitting: Gastroenterology

## 2011-04-02 ENCOUNTER — Ambulatory Visit
Admission: RE | Admit: 2011-04-02 | Discharge: 2011-04-02 | Disposition: A | Discharge status: Home / Self Care | Payer: Medicare Other | Source: Ambulatory Visit | Attending: Radiation Oncology | Admitting: Radiation Oncology

## 2011-04-05 ENCOUNTER — Ambulatory Visit
Admission: RE | Admit: 2011-04-05 | Discharge: 2011-04-05 | Disposition: A | Discharge status: Home / Self Care | Payer: Medicare Other | Source: Ambulatory Visit | Attending: Radiation Oncology | Admitting: Radiation Oncology

## 2011-04-05 ENCOUNTER — Encounter: Payer: Self-pay | Admitting: Radiation Oncology

## 2011-04-05 DIAGNOSIS — C8596 Non-Hodgkin lymphoma, unspecified, intrapelvic lymph nodes: Secondary | ICD-10-CM

## 2011-04-05 DIAGNOSIS — C8590 Non-Hodgkin lymphoma, unspecified, unspecified site: Secondary | ICD-10-CM

## 2011-04-05 DIAGNOSIS — R590 Localized enlarged lymph nodes: Secondary | ICD-10-CM

## 2011-04-05 NOTE — Progress Notes (Signed)
Weekly Management Note Current Dose:  2880 cGy  Projected Dose: 3600 cGy   Narrative:  The patient presents for routine under treatment assessment.  CBCT/MVCT images/Port film x-rays were reviewed.  The chart was checked. He is doing well overall. Wheezing is better. He is done with Decadron. He has a sinus headache and occasional diarrhea. Also, he is tired. He denies skin irritation.   Physical Findings: Weight:  .  His blood pressure is 134/91. His pulse is 82. Temperature 97.1. Weight 182 pounds. The target rate is 18. He is sitting comfortably in a chair in no acute distress. He is nontoxic appearing.  Chest x-ray last week was unremarkable. I informed the patient of this.  Impression:  The patient is tolerating radiotherapy.  Plan:  Continue radiotherapy as planned. He finishes at the end of this week. I will schedule one month followup for him.

## 2011-04-05 NOTE — Progress Notes (Signed)
C/o sinus h/a today, rates 5/10.  Says he took Tylenol, used Saline spray with some decongestant.  o2 Sat 96 - 98%

## 2011-04-06 ENCOUNTER — Ambulatory Visit
Admission: RE | Admit: 2011-04-06 | Discharge: 2011-04-06 | Disposition: A | Discharge status: Home / Self Care | Payer: Medicare Other | Source: Ambulatory Visit | Attending: Radiation Oncology | Admitting: Radiation Oncology

## 2011-04-07 ENCOUNTER — Ambulatory Visit
Admission: RE | Admit: 2011-04-07 | Discharge: 2011-04-07 | Disposition: A | Discharge status: Home / Self Care | Payer: Medicare Other | Source: Ambulatory Visit | Attending: Radiation Oncology | Admitting: Radiation Oncology

## 2011-04-08 ENCOUNTER — Ambulatory Visit
Admission: RE | Admit: 2011-04-08 | Discharge: 2011-04-08 | Disposition: A | Discharge status: Home / Self Care | Payer: Medicare Other | Source: Ambulatory Visit | Attending: Radiation Oncology | Admitting: Radiation Oncology

## 2011-04-09 ENCOUNTER — Ambulatory Visit
Admission: RE | Admit: 2011-04-09 | Discharge: 2011-04-09 | Disposition: A | Discharge status: Home / Self Care | Payer: Medicare Other | Source: Ambulatory Visit | Attending: Radiation Oncology | Admitting: Radiation Oncology

## 2011-04-09 ENCOUNTER — Encounter: Payer: Self-pay | Admitting: *Deleted

## 2011-04-09 NOTE — Progress Notes (Signed)
[place] Psychosocial Distress Screening Clinical Social Work  Clinical Social Work was referred by distress screening protocol.  The patient scored a 6 on the Psychosocial Distress Thermometer which indicates moderate distress. Clinical Social Worker was unable to meet with pt in the exam room; therefore contacted pt at home to assess for distress and other psychosocial needs.  Pt was unable to speak on the phone at time of contact therefore CSW spoke with pt's wife.  Pt's wife did not express any urgent needs or concerns.  Pt's wife reported that pt is experiencing some fatigue, but feels that it will improve once radiation treatments are completed.  Pt's wife stated they were very happy with their experience at [facility], and that [facility] atmosphere is very welcoming.  CSW informed pt's wife of the support services and support team at [facility], and encouraged her or pt to call with any needs or concerns.     Clinical Social Worker follow up needed:not at this time  Johnnye Lana, MSW, LCSW Clinical Social Worker [facility] [phone]

## 2011-04-10 ENCOUNTER — Other Ambulatory Visit: Payer: Self-pay | Admitting: Internal Medicine

## 2011-04-12 ENCOUNTER — Ambulatory Visit: Payer: Medicare Other

## 2011-04-12 ENCOUNTER — Other Ambulatory Visit: Payer: Self-pay | Admitting: Dermatology

## 2011-04-13 ENCOUNTER — Ambulatory Visit: Payer: Medicare Other

## 2011-04-14 ENCOUNTER — Encounter: Payer: Self-pay | Admitting: Radiation Oncology

## 2011-04-14 ENCOUNTER — Ambulatory Visit: Payer: Medicare Other

## 2011-04-14 ENCOUNTER — Telehealth: Payer: Self-pay | Admitting: *Deleted

## 2011-04-14 NOTE — Progress Notes (Signed)
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       He developed some fatigue and occasional diarrhea.              PLAN: Routine followup in one month. Patient instructed to call if questions or worsening complaints in interim.

## 2011-04-14 NOTE — Telephone Encounter (Signed)
Returned Ryan [name] call.  He reports one episode of a "large watery stool" this am.  States he ate oatmeal for breakfast this am with tea.  Ryan [name] completed radiation therapy to his retroperitoneal region on 04/09/11.  He took one Imodium and has not had any other episodes today.  Reviewed avoidance of high certain food times- high fiber foods ( Wheat), foods that promote gas ( Pintos, cabbage salad), Fatty greasy fried foods, spicy foods and acid drinks.  Encouraged intake of BRAT diet (Bananas, Rice, Applesauce, Toast), white bread, baked instead of fried foods, non caffeinated drinks.  Encouraged to eat small frequent meals.  He is currently pushing fluids.  Encouraged to please call if unable to control  Any further diarrheal episodes.

## 2011-04-15 ENCOUNTER — Ambulatory Visit: Payer: Medicare Other

## 2011-04-15 ENCOUNTER — Other Ambulatory Visit: Payer: Self-pay | Admitting: Internal Medicine

## 2011-04-16 ENCOUNTER — Ambulatory Visit: Payer: Medicare Other

## 2011-04-16 ENCOUNTER — Telehealth: Payer: Self-pay

## 2011-04-16 NOTE — Telephone Encounter (Signed)
Just did approval on "Rx request" tab

## 2011-04-16 NOTE — Telephone Encounter (Signed)
Refill request for Xanax 0.5 mg tablet, 1 po qhs, please advise.

## 2011-04-16 NOTE — Telephone Encounter (Signed)
Rx faxed to pharmacy

## 2011-04-20 ENCOUNTER — Ambulatory Visit (HOSPITAL_BASED_OUTPATIENT_CLINIC_OR_DEPARTMENT_OTHER): Payer: Medicare Other | Admitting: Oncology

## 2011-04-20 ENCOUNTER — Telehealth: Payer: Self-pay | Admitting: Oncology

## 2011-04-20 ENCOUNTER — Other Ambulatory Visit (HOSPITAL_BASED_OUTPATIENT_CLINIC_OR_DEPARTMENT_OTHER): Payer: Medicare Other

## 2011-04-20 VITALS — BP 135/83 | HR 92 | Temp 97.7°F | Ht 67.0 in | Wt 179.6 lb

## 2011-04-20 DIAGNOSIS — C8596 Non-Hodgkin lymphoma, unspecified, intrapelvic lymph nodes: Secondary | ICD-10-CM

## 2011-04-20 DIAGNOSIS — C8599 Non-Hodgkin lymphoma, unspecified, extranodal and solid organ sites: Secondary | ICD-10-CM

## 2011-04-20 DIAGNOSIS — D509 Iron deficiency anemia, unspecified: Secondary | ICD-10-CM

## 2011-04-20 DIAGNOSIS — R590 Localized enlarged lymph nodes: Secondary | ICD-10-CM

## 2011-04-20 DIAGNOSIS — R599 Enlarged lymph nodes, unspecified: Secondary | ICD-10-CM

## 2011-04-20 HISTORY — DX: Iron deficiency anemia, unspecified: D50.9

## 2011-04-20 LAB — CBC WITH DIFFERENTIAL/PLATELET
Basophils Absolute: 0 10*3/uL (ref 0.0–0.1)
EOS%: 1.9 % (ref 0.0–7.0)
HCT: 34.2 % — ABNORMAL LOW (ref 38.4–49.9)
HGB: 10.9 g/dL — ABNORMAL LOW (ref 13.0–17.1)
MCH: 25.3 pg — ABNORMAL LOW (ref 27.2–33.4)
MONO#: 0.5 10*3/uL (ref 0.1–0.9)
NEUT%: 68.5 % (ref 39.0–75.0)
lymph#: 1 10*3/uL (ref 0.9–3.3)

## 2011-04-20 LAB — COMPREHENSIVE METABOLIC PANEL
BUN: 17 mg/dL (ref 6–23)
CO2: 27 mEq/L (ref 19–32)
Chloride: 102 mEq/L (ref 96–112)
Creatinine, Ser: 1.07 mg/dL (ref 0.50–1.35)
Glucose, Bld: 125 mg/dL — ABNORMAL HIGH (ref 70–99)

## 2011-04-20 NOTE — Telephone Encounter (Signed)
Gv pt appts for april2013.  scheduled pt for ct scan on 04/19 @ [facility]

## 2011-04-21 NOTE — Progress Notes (Signed)
Hematology and Oncology Follow Up Visit  Ryan [name] [id number] 1931-02-24 78 y.o. 04/21/2011 10:14 AM   Principle Diagnosis: Encounter Diagnosis  Name Primary?  . 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 CT scan showed bulky pelvic lymphadenopathy. Core biopsies diagnostic for low-grade lymphoma. Staging evaluation including CT scan, PET scan, and bone marrow biopsy, showed disease limited to the obvious disease on the CT scan. I referred him for primary radiation as initial treatment. He was evaluated by Dr. Eppie Gibson and was felt to be suitable candidate for radiation. He received 3600 cGy in 20 fractions between February 11 in 04/09/2011. He tolerated the radiation very well. He was given some initial steroids in view of his radiculopathy. He contracted a viral illness during treatment with chest congestion and then diarrhea. A chest radiograph showed no infiltrates. He is experiencing profound fatigue. Diarrhea is resolving. Chest congestion has resolved. He lost about 5 pounds down from 185 in February to current weight of 180 pounds.  Medications: reviewed  Allergies:  Allergies  Allergen Reactions  . Molds & Smuts Other (See Comments)    Also dust mites causes sinus infections, h/a etc.    Review of Systems: Constitutional:   See above Respiratory: See above Cardiovascular:  No ischemic type chest pain or palpitations Gastrointestinal: See above Genito-Urinary: No urinary tract symptoms Musculoskeletal: No muscle or bone pain Neurologic: Radiculopathy has resolved Skin: No rash or ecchymosis Remaining ROS negative.  Physical Exam: Blood pressure 135/83, pulse 92, temperature 97.7 F (36.5  C), temperature source Oral, height 5' 7" (1.702 m), weight 179 lb 9.6 oz (81.466 kg). Wt Readings from Last 3 Encounters:  04/20/11 179 lb 9.6 oz (81.466 kg)  04/05/11 182 lb (82.555 kg)  03/31/11 180 lb 9.6 oz (81.92 kg)     General appearance: Adequately nourished Caucasian man HENNT: Pharynx no erythema or exudate Lymph nodes: No cervical supraclavicular axillary or inguinal adenopathy Breasts: Lungs: Clear to auscultation resonant to percussion Heart: Regular rhythm no murmur or gallop Abdomen: Soft nontender no mass no organomegaly Extremities: No edema no calf tenderness Vascular: No cyanosis Neurologic: Motor strength is 5 over 5 reflexes 2+ symmetric Skin: No rash or ecchymosis  Lab Results: Lab Results  Component Value Date   WBC 5.2 04/20/2011   HGB 10.9* 04/20/2011   HCT 34.2* 04/20/2011   MCV 79.5 04/20/2011   PLT 390 04/20/2011     Chemistry      Component Value Date/Time   NA 141 04/20/2011 1008   K 3.7 04/20/2011 1008   CL 102 04/20/2011 1008   CO2 27 04/20/2011 1008   BUN 17 04/20/2011 1008   CREATININE 1.07 04/20/2011 1008      Component Value Date/Time   CALCIUM 8.5 04/20/2011 1008   ALKPHOS 101 04/20/2011 1008   AST 18 04/20/2011 1008   ALT 15 04/20/2011 1008   BILITOT 0.3 04/20/2011 1008       Impression and Plan: #1. Stage IA well-differentiated lymphocytic lymphoma B-cell  FLIPI low-risk treated with primary radiation as summarized above. Plan: I will get a followup CT scan 6 weeks following the completion of radiation at the end of April. Subsequent scans at a 4 month interval for the first 2 years and then  as indicated.  #2. Microcytic anemia. ON iron studies to his lab when he comes back in April. He may need further evaluation although he had upper and lower endoscopy as part of initial evaluation of his abdominal pain recently so I doubt.he has a GI malignancy.   CC:. Dr. Adella Hare; Dr. Verl Blalock; Dr. Eppie Gibson   Annia Belt, MD 3/20/201310:14 AM

## 2011-04-26 ENCOUNTER — Telehealth: Payer: Self-pay

## 2011-04-26 NOTE — Telephone Encounter (Signed)
Received call from pt's wife stating that pt noticed a "pea-sized knot at the base of his hairline behind his ear" this weekend.  Ms Seago denies redness or pain to the area.  Mr Illes reports having "low grade sinus symptoms" including sinus congestion and slight headache x "weeks."  Questions if pt needs to be evaluated.  Pt has appt with Dr Isidore Moos on 3/29.    Note to Dr Beryle Beams. dph

## 2011-04-26 NOTE — Telephone Encounter (Signed)
Notified pt's wife per Dr Beryle Beams - "This does not sound worrisome at all & does not need immediate attention."  Continue to monitor & show Dr Isidore Moos should it remain on Friday.  Ms roshard rezabek understanding and appreciation. dph

## 2011-04-28 ENCOUNTER — Telehealth: Payer: Self-pay | Admitting: *Deleted

## 2011-04-28 NOTE — Telephone Encounter (Signed)
Pt would like to know if he is needing to keep 6-mth OV scheduled for 04.08.13; this question stems from patient being under the medical care of Dr Beryle Beams [& Dr Squire] at [facility] for his Lymphoma, where he reports that he is having to have wife drive him to appointments and is receiving complete health care treatment along with radiation Txs from his Oncologists. Please advise if patient can hold off on PCP visits while he is undergoing cancer treatments.

## 2011-04-28 NOTE — Telephone Encounter (Signed)
No need for 6 month follow-up. Information from his care team is coming to me. My best thoughts are sent his way. Call me if I can be of any assistance.

## 2011-04-29 ENCOUNTER — Encounter: Payer: Self-pay | Admitting: *Deleted

## 2011-04-29 NOTE — Telephone Encounter (Signed)
Patient informed. Forwarded to scheduler/CW to reschedule 04.08.13 OV.

## 2011-04-30 ENCOUNTER — Ambulatory Visit
Admission: RE | Admit: 2011-04-30 | Discharge: 2011-04-30 | Disposition: A | Discharge status: Home / Self Care | Payer: Medicare Other | Source: Ambulatory Visit | Attending: Radiation Oncology | Admitting: Radiation Oncology

## 2011-04-30 ENCOUNTER — Encounter: Payer: Self-pay | Admitting: Radiation Oncology

## 2011-04-30 VITALS — BP 143/86 | HR 76 | Temp 98.0°F | Wt 182.6 lb

## 2011-04-30 DIAGNOSIS — C8590 Non-Hodgkin lymphoma, unspecified, unspecified site: Secondary | ICD-10-CM

## 2011-04-30 DIAGNOSIS — C8596 Non-Hodgkin lymphoma, unspecified, intrapelvic lymph nodes: Secondary | ICD-10-CM

## 2011-04-30 NOTE — Progress Notes (Signed)
[facility] Follow up Note  Name: Ryan [name]   Date: 04/30/2011    MRN: [id number] DOB: 1931/04/15  CC:  Ryan Hare, MD, MD  Ryan Belt, MD  DIAGNOSIS: Stage I low-grade CD positive B cell non-Hodgkin's lymphoma of the retroperitoneum   INTERVAL SINCE LAST RADIATION: He completed palliative radiotherapy to the abdominal/pelvic tumor, 3600 centigray in 20 fractions on 04/09/2011   ALLERGIES: Molds & smuts and Pollen extract-tree extract   NARRATIVE: The patient is doing well in followup. He is getting his energy back. He still has some fatigue. He has some gas and bloating but this is better and has responded to Pepcid and Gas-X. He cut down on his diclofenac and this has resolved his esophageal reflux.  The radiculopathy that he had in his right thigh is now subtle and not compromising his quality of life significantly. He is planned for a followup CT scan on 05/21/2011.   MEDICATIONS:  Current Outpatient Prescriptions  Medication Sig Dispense Refill  . ALPRAZolam (XANAX) 0.5 MG tablet take 1 tablet by mouth at bedtime  100 tablet  5  . amLODipine (NORVASC) 5 MG tablet take 1 tablet by mouth once daily  30 tablet  3  . aspirin 81 MG tablet Take 81 mg by mouth daily.        . benazepril-hydrochlorthiazide (LOTENSIN HCT) 20-12.5 MG per tablet Take 1 tablet by mouth daily.  30 tablet  11  . Biotin 1000 MCG tablet Take 1,000 mcg by mouth daily.      . calcium elemental as carbonate (TUMS ULTRA 1000) 400 MG tablet Chew 1,000 mg by mouth at bedtime as needed.        . diclofenac (VOLTAREN) 75 MG EC tablet Take 75 mg by mouth daily as needed. For pain      . EPINEPHrine (EPIPEN JR) 0.15 MG/0.3ML injection Inject 0.15 mg into the muscle as needed. For allergic reaction      . famotidine (PEPCID) 20 MG tablet take 1 tablet by mouth twice a day  60 tablet  6  . finasteride (PROSCAR) 5 MG tablet Take 5 mg by mouth daily.        . fluticasone  (FLONASE) 50 MCG/ACT nasal spray Place 2 sprays into the nose as needed. For allergies       . loratadine (CLARITIN) 10 MG tablet Take 10 mg by mouth daily as needed. For allergies      . Red Yeast Rice Extract (RED YEAST RICE PO) Take 1 capsule by mouth daily.        . triazolam (HALCION) 0.25 MG tablet Take 1 tablet (0.25 mg total) by mouth at bedtime as needed.  30 tablet  5  . triazolam (HALCION) 0.25 MG tablet Take 0.25 mg by mouth at bedtime as needed. For sleep         PHYSICAL EXAM:   weight is 182 lb 9.6 oz (82.827 kg). His temperature is 98 F (36.7 C). His blood pressure is 143/86 and his pulse is 76.  he is well appearing. He has no tenderness to palpation throughout his abdomen. It is nondistended. Bowel sounds are normoactive. Right below his scalp in the right posterior neck, he has evidence of a resolving pimple or folliculitis. Patient pointed this out to me as he felt a bump there several days ago. He reports that it is now better.  LABORATORY DATA:  Lab Results  Component Value Date   WBC 5.2  04/20/2011   HGB 10.9* 04/20/2011   HCT 34.2* 04/20/2011   MCV 79.5 04/20/2011   PLT 390 04/20/2011    CMP     Component Value Date/Time   NA 141 04/20/2011 1008   K 3.7 04/20/2011 1008   CL 102 04/20/2011 1008   CO2 27 04/20/2011 1008   GLUCOSE 125* 04/20/2011 1008   BUN 17 04/20/2011 1008   CREATININE 1.07 04/20/2011 1008   CALCIUM 8.5 04/20/2011 1008   PROT 6.0 04/20/2011 1008   ALBUMIN 3.5 04/20/2011 1008   AST 18 04/20/2011 1008   ALT 15 04/20/2011 1008   ALKPHOS 101 04/20/2011 1008   BILITOT 0.3 04/20/2011 1008   GFRNONAA 86.15 08/19/2009 1455   GFRAA 94 08/09/2007 1136     IMPRESSION/PLAN: Doing well; he is due for a CT scan in a few weeks. I will see him back on a when necessary basis. I would appreciate been CC'd to medical oncology note in April. I encouraged the patient and his wife to come in anytime if they would like to be worked back in my schedule.

## 2011-04-30 NOTE — Progress Notes (Signed)
States he continues to have fatigue.  No abdominal pain upon awakening but pain gets progressively worse as days goes on.  Does state that he has "a lot of gas and bloating" and Gas-X BID help to decompress his abdomen And relieves his pain.  Continues on Pepcid BID.  Has gained weight 3 lbs since 04/20/11.  States he is having normal BMs

## 2011-05-07 ENCOUNTER — Ambulatory Visit: Payer: Medicare Other | Admitting: Radiation Oncology

## 2011-05-10 ENCOUNTER — Ambulatory Visit: Payer: Medicare Other | Admitting: Internal Medicine

## 2011-05-13 ENCOUNTER — Telehealth: Payer: Self-pay | Admitting: *Deleted

## 2011-05-13 ENCOUNTER — Telehealth: Payer: Self-pay | Admitting: Radiation Oncology

## 2011-05-13 NOTE — Telephone Encounter (Signed)
Received call from [place], [place] for Dr. Beryle Beams, concerned about patient's condition. Phoned patient at home number listed in demographics. Patient reports that up until Monday of this week he felt like he was "closely getting better and feeling more like himself." Patient states,"I feel like my wife and I may have over done it Sunday by going to lunch with friends then, dinner with other friends." Patient reports that socializing "can be exhausting." Patient reports that he received an allergy shot on Monday as routine. Additionally, patient reports abdominal pain 3-4 hours following meals. Patient states, "I just feel like I am regressing and should see Dr. Isidore Moos." transferred call to Uvaldo Bristle to schedule follow up appointment. Routed message to Patric Dykes, RN and Dr. Isidore Moos. Encouraged patient to visit an urgent care or emergency room if symptoms became worse. Patient verbalized understanding.

## 2011-05-13 NOTE — Telephone Encounter (Signed)
Received vm call from pt stating that he has been doing well & recovering from radiation treatments but over the last few days feels that he has regressed.  He reports no energy & increased pain.  Called & spoke with Sam RN in [place] she will call pt & get back with [place] if she needs to.

## 2011-05-17 ENCOUNTER — Ambulatory Visit: Payer: Medicare Other | Admitting: Internal Medicine

## 2011-05-17 ENCOUNTER — Telehealth: Payer: Self-pay | Admitting: *Deleted

## 2011-05-17 NOTE — Telephone Encounter (Signed)
Called Mr. Janoski for a status check but had to leave message to return my call.  On 05/13/11 he reported feeling more fatigued and was experiencing abdominal pain 3-4 hours following meals.Dr. Isidore Moos was notified. Uvaldo Bristle made appointment for patient to see Dr. Isidore Moos on the 17th of April but M.r Grieshop arrived to the Support Staff area today and requested Uvaldo Bristle cancel  his appointment for 05/19/11 because  He  "felt better."

## 2011-05-19 ENCOUNTER — Ambulatory Visit: Payer: Medicare Other | Admitting: Radiation Oncology

## 2011-05-20 ENCOUNTER — Other Ambulatory Visit: Payer: Self-pay | Admitting: Internal Medicine

## 2011-05-21 ENCOUNTER — Other Ambulatory Visit: Payer: Self-pay | Admitting: Internal Medicine

## 2011-05-21 ENCOUNTER — Ambulatory Visit (HOSPITAL_COMMUNITY)
Admission: RE | Admit: 2011-05-21 | Discharge: 2011-05-21 | Disposition: A | Discharge status: Home / Self Care | Payer: Medicare Other | Source: Ambulatory Visit | Attending: Oncology | Admitting: Oncology

## 2011-05-21 ENCOUNTER — Other Ambulatory Visit (HOSPITAL_BASED_OUTPATIENT_CLINIC_OR_DEPARTMENT_OTHER): Payer: Medicare Other

## 2011-05-21 DIAGNOSIS — C8596 Non-Hodgkin lymphoma, unspecified, intrapelvic lymph nodes: Secondary | ICD-10-CM

## 2011-05-21 DIAGNOSIS — D509 Iron deficiency anemia, unspecified: Secondary | ICD-10-CM

## 2011-05-21 DIAGNOSIS — N281 Cyst of kidney, acquired: Secondary | ICD-10-CM | POA: Insufficient documentation

## 2011-05-21 DIAGNOSIS — K5730 Diverticulosis of large intestine without perforation or abscess without bleeding: Secondary | ICD-10-CM | POA: Insufficient documentation

## 2011-05-21 DIAGNOSIS — C8589 Other specified types of non-Hodgkin lymphoma, extranodal and solid organ sites: Secondary | ICD-10-CM | POA: Insufficient documentation

## 2011-05-21 DIAGNOSIS — K449 Diaphragmatic hernia without obstruction or gangrene: Secondary | ICD-10-CM | POA: Insufficient documentation

## 2011-05-21 LAB — COMPREHENSIVE METABOLIC PANEL
AST: 17 U/L (ref 0–37)
BUN: 16 mg/dL (ref 6–23)
Calcium: 9 mg/dL (ref 8.4–10.5)
Chloride: 106 mEq/L (ref 96–112)
Creatinine, Ser: 1.03 mg/dL (ref 0.50–1.35)
Total Bilirubin: 0.3 mg/dL (ref 0.3–1.2)

## 2011-05-21 LAB — CBC WITH DIFFERENTIAL/PLATELET
BASO%: 0.7 % (ref 0.0–2.0)
HCT: 35.3 % — ABNORMAL LOW (ref 38.4–49.9)
LYMPH%: 32 % (ref 14.0–49.0)
MCH: 25.2 pg — ABNORMAL LOW (ref 27.2–33.4)
MCHC: 31.8 g/dL — ABNORMAL LOW (ref 32.0–36.0)
MCV: 79.3 fL (ref 79.3–98.0)
MONO#: 0.5 10*3/uL (ref 0.1–0.9)
MONO%: 11.6 % (ref 0.0–14.0)
NEUT%: 52.4 % (ref 39.0–75.0)
Platelets: 266 10*3/uL (ref 140–400)
RBC: 4.45 10*6/uL (ref 4.20–5.82)
WBC: 4.4 10*3/uL (ref 4.0–10.3)
nRBC: 0 % (ref 0–0)

## 2011-05-21 LAB — LACTATE DEHYDROGENASE: LDH: 162 U/L (ref 94–250)

## 2011-05-25 ENCOUNTER — Telehealth: Payer: Self-pay | Admitting: Oncology

## 2011-05-25 ENCOUNTER — Ambulatory Visit (HOSPITAL_BASED_OUTPATIENT_CLINIC_OR_DEPARTMENT_OTHER): Payer: Medicare Other | Admitting: Oncology

## 2011-05-25 VITALS — BP 144/90 | HR 71 | Temp 97.0°F | Ht 67.0 in | Wt 184.6 lb

## 2011-05-25 DIAGNOSIS — R5383 Other fatigue: Secondary | ICD-10-CM

## 2011-05-25 DIAGNOSIS — M545 Low back pain, unspecified: Secondary | ICD-10-CM

## 2011-05-25 DIAGNOSIS — R5381 Other malaise: Secondary | ICD-10-CM

## 2011-05-25 DIAGNOSIS — C8596 Non-Hodgkin lymphoma, unspecified, intrapelvic lymph nodes: Secondary | ICD-10-CM

## 2011-05-25 DIAGNOSIS — C8585 Other specified types of non-Hodgkin lymphoma, lymph nodes of inguinal region and lower limb: Secondary | ICD-10-CM

## 2011-05-25 NOTE — Telephone Encounter (Signed)
Gave pt appt for MD in July, will call pt tomorrow for rest of appt

## 2011-05-26 NOTE — Progress Notes (Signed)
Hematology and Oncology Follow Up Visit  Ryan [name] [id number] December 09, 1931 78 y.o. 05/26/2011 1:23 PM   Principle Diagnosis: Encounter Diagnosis  Name Primary?  . 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 He was found to have bulky pelvic disease but no significant disease outside the pelvis on CT or PET scanning. A rare, small, lymphoid aggregate was seen in the bone marrow. After discussion of treatment options, I recommended involved field radiation. He completed treatments given between February 11 in April 09 3598 cGy in 20 fractions. He has had profound fatigue from the radiation and is slowly getting back to his usual activities. He did not lose a significant amount of (about 5 pounds) weight. The radicular symptoms down his right leg have resolved. He is still having some intermittent low back discomfort.  CT scan of the abdomen and pelvis done in anticipation of today's visit on April 19 which I personally reviewed with him and his wife shows a very significant reduction in the bulky pelvic adenopathy. Radiologist gives measurements which I don't think are representative of the excellent response he has had already. I would estimate an approximate 60-70% decrease in bulk tumor. No new areas of concern.   Medications: reviewed  Allergies:  Allergies  Allergen Reactions  . Molds & Smuts Other (See Comments)    Also dust mites causes sinus infections, h/a etc.  . Pollen Extract-Tree Extract Other (See Comments)    HEADACHES, TIRED , DRAINAGE FROM SINUSES    Review of Systems: Constitutional: See above   Respiratory: No cough or dyspnea Cardiovascular: No chest pain or palpitations  Gastrointestinal: Resolved  abdominal pain Genito-Urinary: No urinary tract symptoms Musculoskeletal: See above Neurologic: Skin: Remaining ROS negative.  Physical Exam: Blood pressure 144/90, pulse 71, temperature 97 F (36.1 C), temperature source Oral, height _0  (1.702 m), weight 184 lb 9.6 oz (83.734 kg). Wt Readings from Last 3 Encounters:  05/25/11 184 lb 9.6 oz (83.734 kg)  04/30/11 182 lb 9.6 oz (82.827 kg)  04/20/11 179 lb 9.6 oz (81.466 kg)     General appearance: Well-nourished Caucasian man HENNT: Pharynx no erythema or exudate Lymph nodes: Single approximate 3 cm lymph node palpable left axilla Breasts: Lungs: Clear to auscultation resonant to percussion Heart: Regular rhythm no murmur Abdomen: Soft nontender no mass no organomegaly Extremities: No edema no calf tenderness Vascular: No cyanosis Neurologic: Motor strength 5 over 5 reflexes 2+ symmetric Skin: No rash or ecchymosis  Lab Results: Lab Results  Component Value Date   WBC 4.4 05/21/2011   HGB 11.2* 05/21/2011   HCT 35.3* 05/21/2011   MCV 79.3 05/21/2011   PLT 266 05/21/2011     Chemistry      Component Value Date/Time   NA 142 05/21/2011 1035   K 3.7 05/21/2011 1035   CL 106 05/21/2011 1035   CO2 26 05/21/2011 1035   BUN 16 05/21/2011 1035   CREATININE 1.03 05/21/2011 1035      Component Value Date/Time   CALCIUM 9.0 05/21/2011 1035   ALKPHOS 65 05/21/2011 1035   AST 17 05/21/2011 1035   ALT 16 05/21/2011 1035   BILITOT 0.3 05/21/2011 1035       Radiological Studies: See discussion above    Impression  and Plan: Stage I A  well-differentiated lymphocytic lymphoma FLIPI low risk He has had a significant response to involved field radiation. It is early post radiation at this point and I believe that we will see further reduction in the lymph node mass in his pelvis with time. I'm going to wait another 3 months and then repeat CT scan and get a PET scan.   CC:. Dr. Adella [name]; Dr. Verl [name]; Dr. Eppie [name]   Ryan Belt, MD 4/24/20131:23 PM

## 2011-06-02 ENCOUNTER — Telehealth: Payer: Self-pay | Admitting: Oncology

## 2011-06-02 NOTE — Telephone Encounter (Signed)
Called left message, pt has appt for lab CT and PET Scan on 7/16th , NPO 6 hrs prior to CT, pt will see Dr. Darnell Level on 08/23/11

## 2011-06-09 ENCOUNTER — Encounter: Payer: Self-pay | Admitting: Radiation Oncology

## 2011-06-11 ENCOUNTER — Encounter: Payer: Self-pay | Admitting: Radiation Oncology

## 2011-06-11 ENCOUNTER — Ambulatory Visit
Admission: RE | Admit: 2011-06-11 | Discharge: 2011-06-11 | Disposition: A | Discharge status: Home / Self Care | Payer: Medicare Other | Source: Ambulatory Visit | Attending: Radiation Oncology | Admitting: Radiation Oncology

## 2011-06-11 VITALS — BP 139/90 | HR 68 | Wt 180.9 lb

## 2011-06-11 DIAGNOSIS — C8590 Non-Hodgkin lymphoma, unspecified, unspecified site: Secondary | ICD-10-CM

## 2011-06-11 DIAGNOSIS — R590 Localized enlarged lymph nodes: Secondary | ICD-10-CM

## 2011-06-11 DIAGNOSIS — R5383 Other fatigue: Secondary | ICD-10-CM

## 2011-06-11 DIAGNOSIS — C8596 Non-Hodgkin lymphoma, unspecified, intrapelvic lymph nodes: Secondary | ICD-10-CM

## 2011-06-11 LAB — T4, FREE: Free T4: 1.02 ng/dL (ref 0.80–1.80)

## 2011-06-11 MED ORDER — SUCRALFATE 1 GM/10ML PO SUSP: 1.0000 g | Freq: Three times a day (TID) | ORAL | Status: DC

## 2011-06-11 NOTE — Progress Notes (Signed)
Since his wife's visit on Monday.  Ryan [name] has modified his diet to avoid, high fiber foods, fatty greasy/fried acidic foods and drinks, caffeine and alcohol ( scotch). Cramping present, but decreased since change in dietary habits.

## 2011-06-11 NOTE — Progress Notes (Addendum)
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  His main complaints is that he has had worse abdominal cramping than usual. The patient has a fairly complicated history and looking back at his notes he has been seen by his gastroenterologist for multiple abdominal issues. There is a question of irritable bowel syndrome but he has other issues going on including a ventral hernia, colonic polyps, Barrett's esophagus, diverticulosis and gluten insensitivity.      All of this is in addition of course to his retroperitoneal lymphoma which was treated with radiotherapy a couple months ago. The patient reports that for several years he has experienced spells of abdominal cramps. After the first 6 months of cramps and severe fatigue, he found that giving up gluten helped him. He is also given that the milk and other dairy products.  Essentially, when the patient's wife spoke with my nurse earlier this week, she is concerned about worsening abdominal cramping. I asked that the patient keep a food and drink journal. He has done this over the past 4 days. Essentially, from recording his dietary patterns, the patient has discovered that decreasing the amount of fiber, spices, gas producing vegetables, caffeine, and alcohol has helped improve his cramps. He has a tendency to drink scotch in the middle of the night to help with sleep. He realizes he should probably give this up. He has stopped famotidine because he  thinks it might make his symptoms worse. He denies any diarrhea. He denies any  bleeding or burning from his bowels or bladder. He denies any weight loss or vomiting. No night sweats.  His other main complaint today is profound fatigue. This has been especially noticeable since completing radiotherapy. Patient also reports a history of taking vitamin B12 shots for fatigue. He stopped doing this in January. Before stopping, his vitamin B12 was in the low normal level.  He is wondering whether to go on a trip to [place] as scheduled in 2 weeks. He is concerned about the symptoms. Of note, the CT scan performed since his last followup showed interval decrease in size of his tumor.  ALLERGIES:  is allergic to molds & smuts and pollen extract-tree extract.  Meds: Current Outpatient Prescriptions  Medication Sig Dispense Refill  . ALPRAZolam (XANAX) 0.5 MG tablet take 1 tablet by mouth at bedtime  100 tablet  5  . amLODipine (NORVASC) 5 MG tablet take 1 tablet by mouth once daily  30 tablet  3  . aspirin 81 MG tablet Take 81 mg by mouth daily.        . benazepril-hydrochlorthiazide (LOTENSIN HCT) 20-12.5 MG per tablet Take 1 tablet by mouth daily.  30 tablet  11  . Biotin 1000 MCG tablet Take 1,000 mcg by mouth daily.      . calcium elemental as carbonate (TUMS ULTRA 1000) 400 MG tablet Chew 1,000 mg by mouth at bedtime as needed.        . diclofenac (VOLTAREN) 75 MG EC tablet Take  75 mg by mouth daily as needed. For pain      . diclofenac (VOLTAREN) 75 MG EC tablet take 1 tablet by mouth twice a day with food if needed  60 tablet  1  . EPINEPHrine (EPIPEN JR) 0.15 MG/0.3ML injection Inject 0.15 mg into the muscle as needed. For allergic reaction      . finasteride (PROSCAR) 5 MG tablet Take 5 mg by mouth daily.        . fluticasone (FLONASE) 50 MCG/ACT nasal spray Place 2 sprays into the nose as needed. For allergies       . loratadine (CLARITIN) 10 MG tablet Take 10 mg by mouth daily as needed.  For allergies      . Red Yeast Rice Extract (RED YEAST RICE PO) Take 1 capsule by mouth daily.        . famotidine (PEPCID) 20 MG tablet take 1 tablet by mouth twice a day  60 tablet  6  . sucralfate (CARAFATE) 1 GM/10ML suspension Take 10 mLs (1 g total) by mouth 4 (four) times daily -  with meals and at bedtime. Take about 15 min before meals and bedtime.  420 mL  5  . triazolam (HALCION) 0.25 MG tablet Take 1 tablet (0.25 mg total) by mouth at bedtime as needed.  30 tablet  5  . triazolam (HALCION) 0.25 MG tablet Take 0.25 mg by mouth at bedtime as needed. For sleep        Physical Findings: The patient is in no acute distress. Patient is alert and oriented.  weight is 180 lb 14.4 oz (82.056 kg). His blood pressure is 139/90 and his pulse is 68. .  No significant changes. He is well-appearing. Abdomen is soft and nontender nondistended with no rigidity or guarding. He continues to have a stable ventral hernia. Bowel sounds are hyperactive, as though he has a fair amount of gas   Lab Findings: Lab Results  Component Value Date   WBC 4.4 05/21/2011   HGB 11.2* 05/21/2011   HCT 35.3* 05/21/2011   MCV 79.3 05/21/2011   PLT 266 05/21/2011    CMP     Component Value Date/Time   NA 142 05/21/2011 1035   K 3.7 05/21/2011 1035   CL 106 05/21/2011 1035   CO2 26 05/21/2011 1035   GLUCOSE 96 05/21/2011 1035   BUN 16 05/21/2011 1035   CREATININE 1.03 05/21/2011 1035   CALCIUM 9.0 05/21/2011 1035   PROT 6.1 05/21/2011 1035   ALBUMIN 3.8 05/21/2011 1035   AST 17 05/21/2011 1035   ALT 16 05/21/2011 1035   ALKPHOS 65 05/21/2011 1035   BILITOT 0.3 05/21/2011 1035   GFRNONAA 86.15 08/19/2009 1455   GFRAA 94 08/09/2007 1136      Radiographic Findings: Ct Abdomen Pelvis Wo Contrast  05/21/2011  *RADIOLOGY REPORT*  Clinical Data: Restaging lymphoma.  Patient status post completion of radiotherapy.  CT ABDOMEN AND PELVIS WITHOUT CONTRAST  Technique:  Multidetector CT imaging of the abdomen and pelvis was  performed following the standard protocol without intravenous contrast.  Comparison: PET CT scan 02/21/2011  Findings: Lung bases are clear.  Non-IV contrast images demonstrate tiny hypodensities in the liver too small to characterize but not changed from prior.  Gallbladder, pancreas, spleen, and adrenal glands are unchanged.  Two simple cysts in the right kidney.  Hiatal hernia is present.  The small bowel and proximal colon are normal.  There are multiple diverticula of the left colon and sigmoid  colon.  The proximal abdominal aorta is normal.  There is soft tissue surrounding the distal aorta through the bifurcation and along the right iliac arteries.  This is decreased in volume compared to prior measuring 6.9 x 4.2 cm compared to 9.0 by 6.6 cm on prior (remeasured).  There is no new retroperitoneal adenopathy or retroperitoneal masses.  No inguinal adenopathy.  Review bone windows demonstrates no aggressive osseous lesions.  IMPRESSION:  1.  Interval decrease in size of right retroperitoneal mass surrounding the distal aorta and right common iliac vessels. Findings consistent with positive chemotherapy response. 2.  No evidence of new or progressive disease.  Original Report Authenticated By: Suzy Bouchard, M.D.    Impression:  The patient is recovering from the effects of radiation. The cause of his symptoms is somewhat unclear. He has a complicated history in terms of gastrointestinal issues. I am not convinced that the symptoms are from his radiotherapy as he has had these symptoms for years. Additionally, the dose of radiotherapy was not typical of causing these symptoms over 2 months after completion of therapy. That being said, the cramping and fatigue have been worse than usual since completing radiotherapy  Plan:   I read through his food journal and recommended that he continue to avoid the triggers for his abdominal cramps.  I will give him a prescription for sucralfate to use up to 4 times  a day. We will see if this helps.  I also highly recommended that the patient see Dr. Sharlett Iles in the near future, his gastroenterologist. He has not seen him in about a year. I recommended that he talk to Dr. Sharlett Iles about a replacement medicine for the famotidine, he should probably take something in light of his Barrett's esophagus. Patient was not enthusiastic about starting omeprazole but I offered to prescribe this for him today. Perhaps Dr. Sharlett Iles will have more feedback regarding his symptoms.  I will order labs in the form of a TSH and free T4 as well as a vitamin B12 in light of his energy issues.  I will followup with the patient in July after his next set of scans. I encouraged him to go ahead with his trip to [place].   Over 30 minutes was spent in face-to-face time on this encounter. -----------------------------------  Eppie Gibson, MD

## 2011-06-15 ENCOUNTER — Telehealth: Payer: Self-pay | Admitting: *Deleted

## 2011-06-15 NOTE — Telephone Encounter (Signed)
Called Ryan [name] to inform him that his Lbs on 06/11/11 were within normal limits as relayed by Dr. Isidore Moos and that she feels his lab values "do not explain his fatigue".  Mr Burlingame relayed that he started his carafate and his not longer has cramping nor gas.

## 2011-06-21 ENCOUNTER — Encounter: Payer: Self-pay | Admitting: *Deleted

## 2011-06-21 ENCOUNTER — Telehealth: Payer: Self-pay | Admitting: Gastroenterology

## 2011-06-21 NOTE — Telephone Encounter (Signed)
Pt requesting to be seen asap for abdominal cramping. Pt going out of the country Friday. Pt scheduled to see Nicoletta Ba PA tomorrow at 9:30am. Pt aware of appt date and time.

## 2011-06-21 NOTE — Progress Notes (Signed)
Ryan [name] arrived in clinic today with concerns of resurgence of his abd. Bloating, flatus, and abd. "cramping. Does state that he is having what he considers to be "norma" stools. Denies any rectal bleeding nor nausea nd vomiting. He states that he is having more acid reflux and that he is not getting as much relief for Carafate as he did last week.  He continues on a bland diet as he was instructed 2 weeks ago. He was also instructed by Dr. Isidore Moos on  last week and on his last clinic visit to please make an appointment to see his Gastroenterologist. Dr. Valere Dross reiterated today that Ryan. Mundis should see his GI physician as soon as possible, therefore, Ryan. Alperin visited the GI office today and is waiting for an appointment to see the PA since Dr. Sharlett Iles is not available this week.

## 2011-06-22 ENCOUNTER — Encounter: Payer: Self-pay | Admitting: Physician Assistant

## 2011-06-22 ENCOUNTER — Ambulatory Visit: Payer: Medicare Other | Admitting: Physician Assistant

## 2011-06-22 ENCOUNTER — Ambulatory Visit (INDEPENDENT_AMBULATORY_CARE_PROVIDER_SITE_OTHER): Payer: Medicare Other | Admitting: Physician Assistant

## 2011-06-22 VITALS — BP 124/70 | HR 75 | Ht 67.14 in | Wt 179.6 lb

## 2011-06-22 DIAGNOSIS — C8589 Other specified types of non-Hodgkin lymphoma, extranodal and solid organ sites: Secondary | ICD-10-CM

## 2011-06-22 DIAGNOSIS — C8590 Non-Hodgkin lymphoma, unspecified, unspecified site: Secondary | ICD-10-CM

## 2011-06-22 DIAGNOSIS — R5381 Other malaise: Secondary | ICD-10-CM

## 2011-06-22 DIAGNOSIS — R109 Unspecified abdominal pain: Secondary | ICD-10-CM

## 2011-06-22 DIAGNOSIS — R5383 Other fatigue: Secondary | ICD-10-CM

## 2011-06-22 MED ORDER — GLYCOPYRROLATE 2 MG PO TABS: 2.0000 mg | ORAL_TABLET | Freq: Two times a day (BID) | ORAL | Status: DC

## 2011-06-22 MED ORDER — ALIGN 4 MG PO CAPS: 1.0000 | ORAL_CAPSULE | Freq: Every day | ORAL | Status: DC

## 2011-06-22 NOTE — Progress Notes (Signed)
Agree with initial assessment and plans. Agree with followup with Dr. Sharlett Iles as well

## 2011-06-22 NOTE — Patient Instructions (Signed)
Take Robinul forte 2 mg twice daily for cramping and spasms. We sent a prescription to [facility]. We have given you samples of Align capsules. Take 1 daily.  Keep your follow up with Dr. Sharlett Iles.

## 2011-06-22 NOTE — Progress Notes (Signed)
Subjective:    Patient ID: Ryan [name], male    DOB: 01-20-1932, 78 y.o.   MRN: [id number]  HPI Ryan [name] is a pleasant 78 year old white male known to Dr. Sharlett Iles who has history of gluten intolerance, lactose intolerance, arthritis, diverticulosis and a prior history of a melanoma. He was diagnosed with a low-grade B-cell non-Hodgkin's lymphoma earlier this year when he came in with complaints of abdominal pain and bloating. He also developed a right sacral radiculopathy. He was found to have fairly bulky pelvic disease and had bone marrow biopsy with a rare lymphoid aggregate found. He has been followed by Dr. Beryle Beams and completed a course of radiation in March of 2013. Unfortunately he says he has developed profound fatigue as a result of the radiation. He did have followup CT scan mid April which showed a significant reduction in the bulky pelvic adenopathy felt to be about a 60% decrease overall. He is planned to have followup CT in PET scan in early July.  Patient comes in today because he states about one month ago he started having abdominal cramping and pain which is different than his original abdominal discomfort. He has continued on a gluten free, lactose free diet and is now also eating very low gas. Says his symptoms started after he tried to eat a cancer fighting very spicy lentil soup and have persisted ever since. He was given an course of Carafate by the radiation oncologist which worked for about for 5 days and then stopped working. He was advised to give himself a trial of Prilosec but really has not started that as yet. He says generally he feels better if he doesn't eat and that his symptoms get worse as the day goes on. He feels the pain and cramping in his midabdomen also states that he has a lot of gurgling noise in his abdomen. He has had some abdominal bloating, no nausea ,no vomiting, and his bowels have actually been very normal without melena or hematochezia. He is also  distressed because he also has ongoing significant fatigue. He and his wife are going to [place] at the end of this week for vacation.   Review of Systems  Constitutional: Positive for activity change and fatigue.  HENT: Negative.   Eyes: Negative.   Respiratory: Negative.   Cardiovascular: Negative.   Gastrointestinal: Positive for abdominal pain.  Genitourinary: Negative.   Musculoskeletal: Negative.   Skin: Negative.   Neurological: Positive for numbness.  Hematological: Negative.   Psychiatric/Behavioral: Negative.    Outpatient Prescriptions Prior to Visit  Medication Sig Dispense Refill  . ALPRAZolam (XANAX) 0.5 MG tablet take 1 tablet by mouth at bedtime  100 tablet  5  . amLODipine (NORVASC) 5 MG tablet take 1 tablet by mouth once daily  30 tablet  3  . aspirin 81 MG tablet Take 81 mg by mouth daily.        . benazepril-hydrochlorthiazide (LOTENSIN HCT) 20-12.5 MG per tablet Take 1 tablet by mouth daily.  30 tablet  11  . Biotin 1000 MCG tablet Take 1,000 mcg by mouth daily.      . calcium elemental as carbonate (TUMS ULTRA 1000) 400 MG tablet Chew 1,000 mg by mouth at bedtime as needed.        . diclofenac (VOLTAREN) 75 MG EC tablet Take 75 mg by mouth daily as needed. For pain      . diclofenac (VOLTAREN) 75 MG EC tablet take 1 tablet by mouth twice a day with  food if needed  60 tablet  1  . EPINEPHrine (EPIPEN JR) 0.15 MG/0.3ML injection Inject 0.15 mg into the muscle as needed. For allergic reaction      . fluticasone (FLONASE) 50 MCG/ACT nasal spray Place 2 sprays into the nose as needed. For allergies       . loratadine (CLARITIN) 10 MG tablet Take 10 mg by mouth daily as needed. For allergies      . Red Yeast Rice Extract (RED YEAST RICE PO) Take 1 capsule by mouth daily.        . triazolam (HALCION) 0.25 MG tablet Take 0.25 mg by mouth. Take 1/2 tablet at bedtime and 1/2 tablet middle of night      . triazolam (HALCION) 0.25 MG tablet Take 1 tablet (0.25 mg total) by  mouth at bedtime as needed.  30 tablet  5  . famotidine (PEPCID) 20 MG tablet take 1 tablet by mouth twice a day  60 tablet  6  . finasteride (PROSCAR) 5 MG tablet Take 5 mg by mouth daily.        . sucralfate (CARAFATE) 1 GM/10ML suspension Take 10 mLs (1 g total) by mouth 4 (four) times daily -  with meals and at bedtime. Take about 15 min before meals and bedtime.  420 mL  5       Allergies  Allergen Reactions  . Molds & Smuts Other (See Comments)    Also dust mites causes sinus infections, h/a etc.  . Pollen Extract-Tree Extract Other (See Comments)    HEADACHES, TIRED , DRAINAGE FROM SINUSES   Patient Active Problem List  Diagnoses  . THRUSH  . LACTOSE INTOLERANCE  . HYPERLIPIDEMIA  . INSOMNIA, CHRONIC  . EUSTACHIAN TUBE DYSFUNCTION, BILATERAL  . HYPERTENSION  . ALLERGIC RHINITIS  . VENTRAL HERNIA  . BENIGN PROSTATIC HYPERTROPHY  . ERECTILE DYSFUNCTION, ORGANIC  . OSTEOARTHRITIS  . FATIGUE  . PEDAL EDEMA  . PALPITATIONS  . HEART MURMUR, HX OF  . Personal history of colonic polyps  . History of IBS  . Diverticulosis of colon (without mention of hemorrhage)  . Arthritis involving multiple sites  . Wheat intolerance  . Barrett's esophagus  . Periaortic lymphadenopathy  . Lymphoma of lymph nodes in pelvis  . Melanoma  . NHL (non-Hodgkin's lymphoma)   History   Social History  . Marital Status: Married    Spouse Name: N/A    Number of Children: 2  . Years of Education: N/A   Occupational History  . 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 Smoking status: Former Smoker -- 35 years    Types: Pipe    Quit date: 02/23/1996  . Smokeless tobacco: Never Used   Comment: quit 20 years ago  . Alcohol Use: Yes     2 drinks daily scotch   . Drug Use: No  . Sexually Active: Not on file   Other Topics Concern  . Not on file   Social History Narrative   Married - second marriageHe has two childrenRetired Tax adviser, has a Government social research officer in [place] quit 12 years ago- smoked for 35 yearsAlcohol use- yes    Objective:   Physical Exam well-developed older white male in no acute distress, accompanied by his wife, pleasant blood pressure 124/70 pulse 75 height 5 foot 7 weight 179. HEENT; nontraumatic normocephalic EOMI PERRLA sclera anicteric,neck; Supple, Cardiovascular; regular rate and rhythm  with S1-S2 no murmur or gallop, Pulmonary; clear bilaterally, Abdomen; soft bowel sounds are active he is very mildly tender with deep palpation in the mid abdomen and periumbilical,no guarding no rebound no palpable mass or hepatosplenomegaly bowel sounds are somewhat hyperactive, Rectal; exam not done, Extremities; no clubbing cyanosis or edema skin warm dry, Psych; mood and affect normal and appropriate.        Assessment & Plan:  #62 78 year old male with low-grade B-cell non-Hodgkin lymphoma diagnosed January 2013, now status post radiation completed March 2013 with complaints of persistent fatigue and 1 month history of ongoing mid and lower abdominal cramping and pain. 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 He does not currently have any obstructive symptoms, so hopefully has not developed any stricturing. #2 history of celiac disease and lactose intolerance. #3 prior history of B12 deficiency had been on replacement and on over the past few months. Most recent labs reviewed and B12 level was very low normal in the 200 range #4 other problems as outlined above Plan; Patient was asked to resume B12 injections 1000 mcg monthly Trial of probiotic in the form of Align  one by mouth daily, samples were given today. Trial of Robinul Forte 2 mg by mouth twice daily. Patient has a followup appointment with Dr. Sharlett Iles in about 3 weeks, and is scheduled for followup imaging with CT and PET scan early July

## 2011-06-24 ENCOUNTER — Telehealth: Payer: Self-pay | Admitting: Gastroenterology

## 2011-06-29 NOTE — Telephone Encounter (Signed)
The patient called today to advise his cramps are "largely gone".  He is doing better.

## 2011-06-30 ENCOUNTER — Encounter: Payer: Self-pay | Admitting: *Deleted

## 2011-07-02 NOTE — Progress Notes (Signed)
Encounter addended by: Susa Raring, RN on: 07/02/2011  2:42 PM<BR>     Documentation filed: Charges VN

## 2011-07-05 ENCOUNTER — Other Ambulatory Visit: Payer: Self-pay | Admitting: Internal Medicine

## 2011-07-06 ENCOUNTER — Encounter: Payer: Self-pay | Admitting: Gastroenterology

## 2011-07-06 ENCOUNTER — Ambulatory Visit (INDEPENDENT_AMBULATORY_CARE_PROVIDER_SITE_OTHER): Payer: Medicare Other | Admitting: Gastroenterology

## 2011-07-06 ENCOUNTER — Other Ambulatory Visit: Payer: Self-pay | Admitting: Gastroenterology

## 2011-07-06 VITALS — BP 122/68 | HR 72 | Ht 67.0 in | Wt 179.0 lb

## 2011-07-06 DIAGNOSIS — C8589 Other specified types of non-Hodgkin lymphoma, extranodal and solid organ sites: Secondary | ICD-10-CM

## 2011-07-06 DIAGNOSIS — K589 Irritable bowel syndrome without diarrhea: Secondary | ICD-10-CM

## 2011-07-06 DIAGNOSIS — C8590 Non-Hodgkin lymphoma, unspecified, unspecified site: Secondary | ICD-10-CM

## 2011-07-06 DIAGNOSIS — T887XXA Unspecified adverse effect of drug or medicament, initial encounter: Secondary | ICD-10-CM

## 2011-07-06 DIAGNOSIS — K219 Gastro-esophageal reflux disease without esophagitis: Secondary | ICD-10-CM

## 2011-07-06 MED ORDER — HYOSCYAMINE SULFATE 0.125 MG SL SUBL: 0.1250 mg | SUBLINGUAL_TABLET | Freq: Three times a day (TID) | SUBLINGUAL | Status: DC

## 2011-07-06 NOTE — Progress Notes (Signed)
This is a 78 year old Caucasian male status post abdominal radiation in February for non-Hodgkin's lymphoma. He has had subsequent abdominal gas, bloating, and crampy abdominal pain relieved rather nicely with probiotic therapy and anti-spasmodic some. He has had dry mouth, constipation, urinary hesitancy, and blurred vision all from Rubinul  2 mg twice a day. His GI symptoms are greatly improved. He does have acid reflux exacerbated by Voltaren 75 mg a day, and I have urged him to take daily Prilosec. He denies dysphagia, hepatobiliary complaints, melena, hematochezia, or systemic complaints. He is followed closely by Dr.Granfortuna and Dr. Eppie Gibson and radiation oncology. He has a planned CT scan on July 16. Apparently he has tried by mouth Carafate without improvement.  Current Medications, Allergies, Past Medical History, Past Surgical History, Family History and Social History were reviewed in [employer] record.  Pertinent Review of Systems Negative   Physical  Exam: Healthy-appearing patient in no acute distress. Blood pressure 120/68, pulse 72, weight 179 pounds with a BMI of 28.04. I cannot appreciate stigmata of chronic liver disease. His abdomen shows no organomegaly, masses, or significant tenderness. Bowel sounds are nonobstructive. Mental status is normal.   Assessment and Plan: This patient has a long history of diverticulosis and IBS. I am not sure reaction he has" radiation enteritis" at this point. Since he is doing so well symptomatically, I think we should wait and proceed with his repeat CT scan as scheduled. Recent labs were otherwise unremarkable. I have changed him from Robinul to when necessary Levsin 0.125 mg every 6-8 hours as tolerated. For his drug induced constipation I have recommended MiraLax 8 ounces at bedtime, also continued use of daily Prilosec for his GERD symptoms. Please copy all of his physicians with this note. No diagnosis found.

## 2011-07-06 NOTE — Patient Instructions (Addendum)
Make an office visit for the end of July to see Dr Sharlett Iles  Stop the Robinul and start Levsin as needed.  Your prescription(s) have been sent to you pharmacy, [place]. Take Miralax at bedtime

## 2011-08-01 ENCOUNTER — Other Ambulatory Visit: Payer: Self-pay | Admitting: Internal Medicine

## 2011-08-04 ENCOUNTER — Other Ambulatory Visit: Payer: Self-pay | Admitting: *Deleted

## 2011-08-04 MED ORDER — TRIAZOLAM 0.25 MG PO TABS: 0.1250 mg | ORAL_TABLET | Freq: Every evening | ORAL | Status: DC | PRN

## 2011-08-04 NOTE — Telephone Encounter (Signed)
R'cd fax from [facility] for refill of Holt written 07/05/2011 #30 with 0 refills-please advise.

## 2011-08-05 ENCOUNTER — Other Ambulatory Visit: Payer: Self-pay | Admitting: Internal Medicine

## 2011-08-06 ENCOUNTER — Telehealth: Payer: Self-pay | Admitting: *Deleted

## 2011-08-06 ENCOUNTER — Other Ambulatory Visit: Payer: Self-pay | Admitting: *Deleted

## 2011-08-06 MED ORDER — TRIAZOLAM 0.25 MG PO TABS: 0.1250 mg | ORAL_TABLET | Freq: Every evening | ORAL | Status: DC | PRN

## 2011-08-06 MED ORDER — AMLODIPINE BESYLATE 5 MG PO TABS: 5.0000 mg | ORAL_TABLET | Freq: Every day | ORAL | Status: DC

## 2011-08-06 NOTE — Telephone Encounter (Signed)
Refills called to pharmacy for Halcion and norvasc. Patient notified

## 2011-08-06 NOTE — Telephone Encounter (Signed)
Patient called upset that Rx states he need to be seen for further refills. States that he talked with Dr. Linda Hedges concerning this and was told because he sees so many other providers for his health issues and lab test that he did not have to keep appt. Schedule with Dr, Linda Hedges concerning medication refills.

## 2011-08-09 ENCOUNTER — Telehealth: Payer: Self-pay | Admitting: *Deleted

## 2011-08-09 NOTE — Telephone Encounter (Signed)
Call from pt reporting he was not prescribed a "drink" for his scan. Last time this happened he had to wait in radiology and drink it there. Pt is also scheduled for PET. Called radiology to confirm pt was not to begin drink at home. Spoke with Truman Hayward, pt will be given contrast in radiology day of exam.  Returned call to pt with this information. He voiced understanding.

## 2011-08-17 ENCOUNTER — Other Ambulatory Visit (HOSPITAL_BASED_OUTPATIENT_CLINIC_OR_DEPARTMENT_OTHER): Payer: Medicare Other

## 2011-08-17 ENCOUNTER — Encounter (HOSPITAL_COMMUNITY)
Admission: RE | Admit: 2011-08-17 | Discharge: 2011-08-17 | Disposition: A | Discharge status: Home / Self Care | Payer: Medicare Other | Source: Ambulatory Visit | Attending: Oncology | Admitting: Oncology

## 2011-08-17 DIAGNOSIS — C8596 Non-Hodgkin lymphoma, unspecified, intrapelvic lymph nodes: Secondary | ICD-10-CM | POA: Insufficient documentation

## 2011-08-17 DIAGNOSIS — R599 Enlarged lymph nodes, unspecified: Secondary | ICD-10-CM

## 2011-08-17 DIAGNOSIS — C8585 Other specified types of non-Hodgkin lymphoma, lymph nodes of inguinal region and lower limb: Secondary | ICD-10-CM

## 2011-08-17 LAB — CBC WITH DIFFERENTIAL/PLATELET
Basophils Absolute: 0 10*3/uL (ref 0.0–0.1)
EOS%: 4.6 % (ref 0.0–7.0)
Eosinophils Absolute: 0.2 10*3/uL (ref 0.0–0.5)
HCT: 38 % — ABNORMAL LOW (ref 38.4–49.9)
HGB: 12.4 g/dL — ABNORMAL LOW (ref 13.0–17.1)
LYMPH%: 27.1 % (ref 14.0–49.0)
MCH: 26.5 pg — ABNORMAL LOW (ref 27.2–33.4)
MCV: 81.1 fL (ref 79.3–98.0)
MONO%: 10.3 % (ref 0.0–14.0)
NEUT#: 2.1 10*3/uL (ref 1.5–6.5)
NEUT%: 57.3 % (ref 39.0–75.0)
Platelets: 235 10*3/uL (ref 140–400)

## 2011-08-17 LAB — CMP (CANCER CENTER ONLY)
ALT(SGPT): 20 U/L (ref 10–47)
CO2: 30 mEq/L (ref 18–33)
Calcium: 8.4 mg/dL (ref 8.0–10.3)
Chloride: 100 mEq/L (ref 98–108)
Creat: 0.9 mg/dl (ref 0.6–1.2)
Glucose, Bld: 110 mg/dL (ref 73–118)
Total Bilirubin: 0.6 mg/dl (ref 0.20–1.60)
Total Protein: 6.6 g/dL (ref 6.4–8.1)

## 2011-08-17 LAB — GLUCOSE, CAPILLARY: Glucose-Capillary: 91 mg/dL (ref 70–99)

## 2011-08-17 LAB — SEDIMENTATION RATE: Sed Rate: 8 mm/hr (ref 0–16)

## 2011-08-17 MED ORDER — FLUDEOXYGLUCOSE F - 18 (FDG) INJECTION
15.1000 | Freq: Once | INTRAVENOUS | Status: AC | PRN
  Administered 2011-08-17: 15.1 via INTRAVENOUS

## 2011-08-17 MED ORDER — IOHEXOL 300 MG/ML  SOLN
100.0000 mL | Freq: Once | INTRAMUSCULAR | Status: AC | PRN
  Administered 2011-08-17: 100 mL via INTRAVENOUS

## 2011-08-20 ENCOUNTER — Ambulatory Visit
Admission: RE | Admit: 2011-08-20 | Discharge: 2011-08-20 | Disposition: A | Discharge status: Home / Self Care | Payer: Medicare Other | Source: Ambulatory Visit | Attending: Radiation Oncology | Admitting: Radiation Oncology

## 2011-08-20 ENCOUNTER — Other Ambulatory Visit: Payer: Self-pay | Admitting: Radiation Oncology

## 2011-08-20 ENCOUNTER — Encounter: Payer: Self-pay | Admitting: Radiation Oncology

## 2011-08-20 VITALS — BP 132/87 | HR 80 | Temp 98.0°F | Wt 182.1 lb

## 2011-08-20 DIAGNOSIS — C8590 Non-Hodgkin lymphoma, unspecified, unspecified site: Secondary | ICD-10-CM

## 2011-08-20 DIAGNOSIS — C8596 Non-Hodgkin lymphoma, unspecified, intrapelvic lymph nodes: Secondary | ICD-10-CM

## 2011-08-20 NOTE — Progress Notes (Signed)
Fu Visit today.  Eating more and now states he does not have any cramping or abdominal pain for "a couple of weeks."  Taking a Probiotic daily and eating yogurt and sauerkraut and antispasmodic daily.  Reports that energy gradually getting better.  Going to the pool 3-4 times weekly.

## 2011-08-20 NOTE — Progress Notes (Signed)
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  Overall I think he is doing fairly well. He has seen his gastroenterologist and is on antispasmodic medication which he seems to be tolerating. He takes MiraLAX powder when necessary. His abdominal cramping is not as bad as usual. Of note the patient does have numerous other gastroenterologic issues which preceded his lymphoma. The patient reports that his Carafate did not help me through it out and then realize that he had not been shaken up bottles and therefore he actually hadn't had a good concentration of Carafate in each tablespoon.  He underwent recent PET/CT imaging which are reviewed with the patient and his wife today. It demonstrates partial response to therapy. No new sites of disease.                              ALLERGIES:  is allergic to molds & smuts and pollen extract-tree extract.  Meds: Current Outpatient Prescriptions  Medication Sig Dispense Refill  . ALPRAZolam (XANAX) 0.5 MG tablet take 1 tablet by mouth at bedtime  100 tablet  5  . amLODipine (NORVASC) 5 MG tablet Take 1 tablet (5 mg total) by mouth daily.  30 tablet  3  . aspirin 81 MG tablet Take 81 mg by mouth daily.        . benazepril-hydrochlorthiazide (LOTENSIN HCT) 20-12.5 MG per tablet Take 1 tablet by mouth daily.  30 tablet  11  . Biotin 1000 MCG tablet Take 1,000 mcg by mouth daily.      . calcium elemental as carbonate (TUMS ULTRA 1000) 400 MG tablet Chew 1,000 mg by mouth at bedtime as needed.        . diclofenac (VOLTAREN) 75 MG EC tablet       . EPINEPHrine (EPIPEN JR) 0.15  MG/0.3ML injection Inject 0.15 mg into the muscle as needed. For allergic reaction      . fluticasone (FLONASE) 50 MCG/ACT nasal spray Place 2 sprays into the nose as needed. For allergies       . hyoscyamine (LEVSIN/SL) 0.125 MG SL tablet Place 1 tablet (0.125 mg total) under the tongue 3 (three) times daily.  90 tablet  3  . loratadine (CLARITIN) 10 MG tablet Take 10 mg by mouth daily as needed. For allergies      . Omeprazole (PRILOSEC PO) Take by mouth as needed.      Marland Kitchen omeprazole (PRILOSEC) 20 MG capsule take 1 capsule by mouth once daily  90 capsule  3  . Probiotic Product (ALIGN) 4 MG CAPS Take 1 capsule by mouth daily.  28 capsule  0  . Red Yeast Rice Extract (RED YEAST RICE PO) Take 1 capsule by mouth daily.        . triazolam (HALCION) 0.25 MG tablet Take 0.5 tablets (0.125 mg total) by mouth at bedtime as needed.  30 tablet  5    Physical Findings: The patient is in no acute distress. Patient is alert and oriented.  weight is 182 lb 1.6 oz (82.6 kg). His temperature is 98 F (  36.7 C). His blood pressure is 132/87 and his pulse is 80. .  No tenderness to palpation over the abdomen. Bowel sounds are normoactive.  Lab Findings: Lab Results  Component Value Date   WBC 3.7* 08/17/2011   HGB 12.4* 08/17/2011   HCT 38.0* 08/17/2011   MCV 81.1 08/17/2011   PLT 235 08/17/2011   CMP     Component Value Date/Time   NA 137 08/17/2011 1043   NA 142 05/21/2011 1035   K 3.9 08/17/2011 1043   K 3.7 05/21/2011 1035   CL 100 08/17/2011 1043   CL 106 05/21/2011 1035   CO2 30 08/17/2011 1043   CO2 26 05/21/2011 1035   GLUCOSE 110 08/17/2011 1043   GLUCOSE 96 05/21/2011 1035   BUN 18 08/17/2011 1043   BUN 16 05/21/2011 1035   CREATININE 0.9 08/17/2011 1043   CREATININE 1.03 05/21/2011 1035   CALCIUM 8.4 08/17/2011 1043   CALCIUM 9.0 05/21/2011 1035   PROT 6.6 08/17/2011 1043   PROT 6.1 05/21/2011 1035   ALBUMIN 3.8 05/21/2011 1035   AST 26 08/17/2011 1043   AST 17 05/21/2011 1035   ALT 16 05/21/2011  1035   ALKPHOS 68 08/17/2011 1043   ALKPHOS 65 05/21/2011 1035   BILITOT 0.60 08/17/2011 1043   BILITOT 0.3 05/21/2011 1035   GFRNONAA 86.15 08/19/2009 1455   GFRAA 94 08/09/2007 1136     Radiographic Findings: Ct Abdomen Pelvis W Contrast  08/17/2011  *RADIOLOGY REPORT*  Clinical Data: Non-Hodgkins lymphoma diagnosed January 2013. Radiation therapy complete.  Prior left inguinal hernia repair. Constipation.  CT ABDOMEN AND PELVIS WITH CONTRAST  Technique:  Multidetector CT imaging of the abdomen and pelvis was performed following the standard protocol during bolus administration of intravenous contrast.  Contrast: 123m OMNIPAQUE IOHEXOL 300 MG/ML  SOLN  Comparison: PET CT 08/17/2011.  CT abdomen pelvis 05/21/2011 and 02/10/2011.  Findings: The nodal mass surrounding the aortic bifurcation and right iliac vessels has a transverse dimension of (utilizing previous landmarks which incorporates vessels) 5.9 x 4 cm versus 05/21/2011 measurement of 6.9 x 4.2  cm and 02/10/2011 measurement of 9.0 x 6.6 cm.  On the PET CT performed today, more accurate transverse dimension of 4 x 3.3 cm is noted and represents improvement when compared to the prior PET CT when this measures 7.3 x 6.1 cm.  There has also been a decrease in F D G uptake consistent with partial response to treatment.  No new or worrisome adenopathy noted.  Fatty liver.  Stable appearance of nonspecific 9 mm low density structure, the liver.  Moderate to large hiatal hernia with rotation of the stomach.  Anterior abdominal wall fatty containing hernia with small amount of fluid.  No calcified gallstones.  No focal splenic, pancreatic or adrenal lesion.  Bilateral renal lesions without change larger ones which appear to be cysts.  Smaller ones too small too characterize one of which may be a small angiomyolipoma on the left.  Atherosclerotic type changes of the aorta with ectasia. Atherosclerotic type changes iliac arteries with mild ectasia ( right iliac  bifurcation measuring up to 1.6 cm without change). Right common iliac artery and right iliac bifurcation surrounded by adenopathy.  Prostate gland top normal size of small calcifications.  Fatty containing inguinal hernia.  Appendix extends towards the superior aspect of the right fatty containing inguinal hernia.  Scattered colonic diverticula without surrounding inflammation.  Noncontrast filled views urinary bladder unremarkable.  No worrisome bony destructive lesion. Degenerative changes lower  lumbar spine.  IMPRESSION: Improvement in adenopathy aortic bifurcation/ right iliac artery region as detailed above. Findings consistent with partial response to treatment.  Please see PET CT report performed the same date.  No new adenopathy noted.  Original Report Authenticated By: Doug Sou, M.D.   Nm Pet Image Restag (ps) Skull Base To Thigh  08/17/2011  *RADIOLOGY REPORT*  Clinical Data: Subsequent treatment strategy for lymphoma. Stage I low grade non Hodgkin's lymphoma of the retroperitoneum.  NUCLEAR MEDICINE PET SKULL BASE TO THIGH  Fasting Blood Glucose:  91  Technique:  15.1 mCi F-18 FDG was injected intravenously. CT data was obtained and used for attenuation correction and anatomic localization only.  (This was not acquired as a diagnostic CT examination.) Additional exam technical data entered on technologist worksheet.  Comparison:  PET CT scan 03/01/2011  Findings:  Neck: No hypermetabolic lymph nodes in the neck.  Chest:  No hypermetabolic mediastinal or hilar nodes.  No suspicious pulmonary nodules on the CT scan.  Abdomen/Pelvis:  Interval decrease in size and metabolic activity of right iliac mass.  The nodal mass now measures 3.3 x 4.0 cm (image 183) compared to 6.1 x 7.3 cm on prior remeasured.  The activity is reduced with SUV max = 5.3 compared to SUV max of 6.7 on prior.  Of note, the previous S U V was reported  incorrectly high due to partial inclusion  of the urinary bladder in the  ROI.  Skelton:  No focal hypermetabolic activity to suggest skeletal metastasis.  IMPRESSION:  1.   Reduction in size and activity of the left iliac nodal mass consistent with partial response. 2.  No evidence of new disease in the chest, abdomen, or pelvis.  Original Report Authenticated By: Suzy Bouchard, M.D.    Impression:  Doing well overall. Partial response to radiotherapy. Symptomatically, satisfactory.  Plan:  I will see him back in one year for followup. He will continue to be followed by medical oncology. -----------------------------------  Eppie Gibson, MD

## 2011-08-23 ENCOUNTER — Ambulatory Visit (HOSPITAL_BASED_OUTPATIENT_CLINIC_OR_DEPARTMENT_OTHER): Payer: Medicare Other | Admitting: Oncology

## 2011-08-23 VITALS — BP 137/85 | HR 63 | Temp 97.7°F | Ht 67.0 in | Wt 182.7 lb

## 2011-08-23 DIAGNOSIS — C8596 Non-Hodgkin lymphoma, unspecified, intrapelvic lymph nodes: Secondary | ICD-10-CM

## 2011-08-23 NOTE — Progress Notes (Signed)
Hematology and Oncology Follow Up Visit  Ryan [name] [id number] [birth year] 78 y.o. 08/23/2011 6:03 PM   Principle Diagnosis: Encounter Diagnosis  Name Primary?  . 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 He was found to have bulky pelvic disease but no significant disease outside the pelvis on CT or PET scanning. A rare, small, lymphoid aggregate was seen in the bone marrow. After discussion of treatment options, I recommended involved field radiation. He completed treatments given between February 11 and March 8: 3600 cGy in 20 fractions.  Interim CT scanning done 05/21/2011 showed a significant reduction in the lymph node mass. There has been further reduction on study done in anticipation of today's visit on July 16. A PET scan was also done. I reviewed all these images with the patient and his wife. I think the radiologist report underestimates the dramatic response that he has achieved. Although there is residual soft tissue abnormality and residual PET activity, which is spotty, there is no comparison compared with the pretreatment studies with respect to both size and PET activity.  He is still having significant intermittent and at times severe lower abdominal cramping. Intermittent loose bowels. However, overall his performance status has improved significantly. His color is good. His weight is stable. He was able to take a trip to [place] with his wife and although his activities were limited he was still able to enjoy himself.    Medications: reviewed  Allergies:  Allergies  Allergen Reactions  . Molds & Smuts Other (See Comments)    Also dust mites causes sinus infections, h/a etc.  . Pollen  Extract-Tree Extract Other (See Comments)    HEADACHES, TIRED , DRAINAGE FROM SINUSES    Review of Systems: Constitutional:   Improving constitutional symptoms Respiratory: No cough or dyspnea Cardiovascular:  No chest pain or palpitations Gastrointestinal: See above Genito-Urinary: No urinary tract symptoms Musculoskeletal: No muscle or bone pain Neurologic: Resolved radiculopathy Skin: No rash Remaining ROS negative.  Physical Exam: Blood pressure 137/85, pulse 63, temperature 97.7 F (36.5 C), temperature source Oral, height _0  (1.702 m), weight 182 lb 11.2 oz (82.872 kg). Wt Readings from Last 3 Encounters:  08/23/11 182 lb 11.2 oz (82.872 kg)  08/20/11 182 lb 1.6 oz (82.6 kg)  07/06/11 179 lb (81.194 kg)     General appearance: Well-nourished Caucasian man HENNT: Pharynx no erythema exudate or ulcer or mass Lymph nodes: No cervical supraclavicular axillary or inguinal adenopathy Breasts: Lungs: Clear to auscultation resonant to percussion Heart: Regular rhythm no murmur Abdomen: Soft nontender, no mass no organomegaly Extremities: No edema no calf tenderness Vascular: No cyanosis Neurologic: Motor strength 5 over 5 reflexes 1+ symmetric Skin: No rash or ecchymosis  Lab Results: Lab Results  Component Value Date   WBC 3.7* 08/17/2011   HGB 12.4* 08/17/2011   HCT 38.0* 08/17/2011   MCV 81.1 08/17/2011   PLT 235 08/17/2011     Chemistry      Component Value Date/Time   NA 137 08/17/2011 1043   NA 142 05/21/2011 1035   K 3.9 08/17/2011 1043   K 3.7 05/21/2011 1035   CL 100 08/17/2011 1043   CL 106 05/21/2011 1035   CO2 30 08/17/2011 1043   CO2  26 05/21/2011 1035   BUN 18 08/17/2011 1043   BUN 16 05/21/2011 1035   CREATININE 0.9 08/17/2011 1043   CREATININE 1.03 05/21/2011 1035      Component Value Date/Time   CALCIUM 8.4 08/17/2011 1043   CALCIUM 9.0 05/21/2011 1035   ALKPHOS 68 08/17/2011 1043   ALKPHOS 65 05/21/2011 1035   AST 26 08/17/2011 1043   AST 17 05/21/2011  1035   ALT 16 05/21/2011 1035   BILITOT 0.60 08/17/2011 1043   BILITOT 0.3 05/21/2011 1035       Radiological Studies: Ct Abdomen Pelvis W Contrast  08/17/2011  *RADIOLOGY REPORT*  Clinical Data: Non-Hodgkins lymphoma diagnosed January 2013. Radiation therapy complete.  Prior left inguinal hernia repair. Constipation.  CT ABDOMEN AND PELVIS WITH CONTRAST  Technique:  Multidetector CT imaging of the abdomen and pelvis was performed following the standard protocol during bolus administration of intravenous contrast.  Contrast: 153m OMNIPAQUE IOHEXOL 300 MG/ML  SOLN  Comparison: PET CT 08/17/2011.  CT abdomen pelvis 05/21/2011 and 02/10/2011.  Findings: The nodal mass surrounding the aortic bifurcation and right iliac vessels has a transverse dimension of (utilizing previous landmarks which incorporates vessels) 5.9 x 4 cm versus 05/21/2011 measurement of 6.9 x 4.2  cm and 02/10/2011 measurement of 9.0 x 6.6 cm.  On the PET CT performed today, more accurate transverse dimension of 4 x 3.3 cm is noted and represents improvement when compared to the prior PET CT when this measures 7.3 x 6.1 cm.  There has also been a decrease in F D G uptake consistent with partial response to treatment.  No new or worrisome adenopathy noted.  Fatty liver.  Stable appearance of nonspecific 9 mm low density structure, the liver.  Moderate to large hiatal hernia with rotation of the stomach.  Anterior abdominal wall fatty containing hernia with small amount of fluid.  No calcified gallstones.  No focal splenic, pancreatic or adrenal lesion.  Bilateral renal lesions without change larger ones which appear to be cysts.  Smaller ones too small too characterize one of which may be a small angiomyolipoma on the left.  Atherosclerotic type changes of the aorta with ectasia. Atherosclerotic type changes iliac arteries with mild ectasia ( right iliac bifurcation measuring up to 1.6 cm without change). Right common iliac artery and right  iliac bifurcation surrounded by adenopathy.  Prostate gland top normal size of small calcifications.  Fatty containing inguinal hernia.  Appendix extends towards the superior aspect of the right fatty containing inguinal hernia.  Scattered colonic diverticula without surrounding inflammation.  Noncontrast filled views urinary bladder unremarkable.  No worrisome bony destructive lesion. Degenerative changes lower lumbar spine.  IMPRESSION: Improvement in adenopathy aortic bifurcation/ right iliac artery region as detailed above. Findings consistent with partial response to treatment.  Please see PET CT report performed the same date.  No new adenopathy noted.  Original Report Authenticated By: SDoug Sou M.D.   Nm Pet Image Restag (ps) Skull Base To Thigh  08/17/2011  *RADIOLOGY REPORT*  Clinical Data: Subsequent treatment strategy for lymphoma. Stage I low grade non Hodgkin's lymphoma of the retroperitoneum.  NUCLEAR MEDICINE PET SKULL BASE TO THIGH  Fasting Blood Glucose:  91  Technique:  15.1 mCi F-18 FDG was injected intravenously. CT data was obtained and used for attenuation correction and anatomic localization only.  (This was not acquired as a diagnostic CT examination.) Additional exam technical data entered on technologist worksheet.  Comparison:  PET CT scan 03/01/2011  Findings:  Neck: No hypermetabolic lymph nodes in the neck.  Chest:  No hypermetabolic mediastinal or hilar nodes.  No suspicious pulmonary nodules on the CT scan.  Abdomen/Pelvis:  Interval decrease in size and metabolic activity of right iliac mass.  The nodal mass now measures 3.3 x 4.0 cm (image 183) compared to 6.1 x 7.3 cm on prior remeasured.  The activity is reduced with SUV max = 5.3 compared to SUV max of 6.7 on prior.  Of note, the previous S U V was reported  incorrectly high due to partial inclusion  of the urinary bladder in the ROI.  [place]el[place]:  No focal hypermetabolic activity to suggest skeletal metastasis.   IMPRESSION:  1.   Reduction in size and activity of the left iliac nodal mass consistent with partial response. 2.  No evidence of new disease in the chest, abdomen, or pelvis.  Original Report Authenticated By: Suzy Bouchard, M.D.    Impression and Plan: Significant response to involved field radiation of a bulky pelvic lymph node mass.  I believe that we are going to see additional regression of residual soft tissue density and I am not concerned that there is still some patchy PET activity. I discussed 2 alternative strategies with the patient and his wife. Although we could certainly start him on Rituxan anti-B-cell antibody at this time, I would prefer to repeat a short interval CT scan in 10 weeks. If the disease continues to regress, then continue observation alone. If any signs of re growth, initiate immunotherapy at that time. I do not think that a period of observation will jeopardize him or any treatment that he might need in the future.   CC:. Dr. Adella Hare. Dr. Verl Blalock. Dr. Artist Beach, MD 7/22/20136:03 PM

## 2011-08-26 ENCOUNTER — Ambulatory Visit: Payer: Medicare Other | Admitting: Gastroenterology

## 2011-09-13 ENCOUNTER — Ambulatory Visit (INDEPENDENT_AMBULATORY_CARE_PROVIDER_SITE_OTHER)
Admission: RE | Admit: 2011-09-13 | Discharge: 2011-09-13 | Disposition: A | Discharge status: Home / Self Care | Payer: Medicare Other | Source: Ambulatory Visit | Attending: Internal Medicine | Admitting: Internal Medicine

## 2011-09-13 ENCOUNTER — Ambulatory Visit (INDEPENDENT_AMBULATORY_CARE_PROVIDER_SITE_OTHER): Payer: Medicare Other | Admitting: Internal Medicine

## 2011-09-13 ENCOUNTER — Encounter: Payer: Self-pay | Admitting: Internal Medicine

## 2011-09-13 VITALS — BP 140/82 | HR 66 | Temp 97.8°F | Resp 16 | Wt 183.0 lb

## 2011-09-13 DIAGNOSIS — C8590 Non-Hodgkin lymphoma, unspecified, unspecified site: Secondary | ICD-10-CM

## 2011-09-13 DIAGNOSIS — M5440 Lumbago with sciatica, unspecified side: Secondary | ICD-10-CM

## 2011-09-13 DIAGNOSIS — I10 Essential (primary) hypertension: Secondary | ICD-10-CM

## 2011-09-13 DIAGNOSIS — C8589 Other specified types of non-Hodgkin lymphoma, extranodal and solid organ sites: Secondary | ICD-10-CM

## 2011-09-13 DIAGNOSIS — M5430 Sciatica, unspecified side: Secondary | ICD-10-CM

## 2011-09-13 DIAGNOSIS — Z8719 Personal history of other diseases of the digestive system: Secondary | ICD-10-CM

## 2011-09-13 NOTE — Progress Notes (Signed)
Subjective:    Patient ID: Ryan [name], male    DOB: 1931-02-14, 78 y.o.   MRN: [id number]  HPI Ryan [name] has had a significant interval h/x: non-hodkins lymphoma in the pelvis. He has followed with Dr. Lanell Persons for XRT and Beryle Beams for medical oncology. He has had a good response to XRT with decreased tumor mass. AT this point no chemotherapy is planned - but for recurrent regrowth he may be a candidate for retuxan per Dr. Synthia Innocent note.   He has had continue abdominal abdominal pain and cramping. He has seen Amy Easterwood and Dr. Sharlett Iles on several occasions and has done a little better with hyoscamine and probiotic. He has explored the alternatives for treatment and has tried several herbal products. He has read about "leaky gut" syndrome - did search on Pubmed.gov for information on intestinal permeability and provided a summary abstract for him. This is an area without defined and established symptoms or treatments. He is cautioned about the need to verify any information or treatments he discovers on the "net."  Past Medical History  Diagnosis Date  . Hyperlipemia   . Hypertension   . Benign prostatic hypertrophy   . Chronic fatigue   . Lactose intolerance   . Sleep disorder   . Personal history of colonic polyps 2004    hyperplastic Dr. Collene Mares  . Gluten intolerance   . Barrett's esophagus   . Esophageal reflux   . Diverticulosis of colon (without mention of hemorrhage)   . Periaortic lymphadenopathy 02/16/2011  . Melanoma     lymphoma  . 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 Atelectasis, bilateral 02/10/11    Noted On CT Scan - Mild Dependent Atelectasis at the Lung Bases  . Hiatal hernia 02/10/11    Noted on CT Scan - Moderate Hiatal Hernia  . Renal cyst 02/10/11     Noted on CT Scan - Bilateral Renal Cysts  . Osteoarthritis    hands/feet,knees,  . S/P radiation therapy 03/15/11 - 04/09/11    Abdominal/ Pelvic Tumor, 3600 cGy/20 Fractions  . Microcytic anemia 04/20/2011   . Abdominal pain     Bloating and gas  . Gastritis   . Internal hemorrhoids    Past Surgical History  Procedure Date  . Rotator cuff repair     right  . Hernia repair   . Cataract extraction, bilateral   . Tonsillectomy   . Arthroscopic repair acl     right  . Bone marrow aspiration 02/25/11    Bone Marrow, Aspirate, Clot, and Bilateral Bx, Right PIC   Family History  Problem Relation Age of Onset  . Heart disease Father     MI 27  . Prostate cancer Brother   . Prostate cancer Paternal Uncle   . Prostate cancer Paternal Uncle   . Prostate cancer Paternal Uncle   . Colon cancer Neg Hx   . Hyperlipidemia    . Stroke    . Hypertension    . ADD / ADHD     History   Social History  . Marital Status: Married    Spouse Name: N/A    Number of Children: 2  . Years of Education: N/A   Occupational History  . 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 Smoking status: Former  Smoker -- 35 years    Types: Pipe    Quit date: 02/23/1996  . Smokeless tobacco: Never Used   Comment: quit 20 years ago  . Alcohol Use: Yes     2 drinks daily scotch   . Drug Use: No  . Sexually Active: Not on file   Other Topics Concern  . Not on file   Social History Narrative   Married - second marriageHe has two childrenRetired Counsellor, has a Government social research officer in [place] quit 12 years ago- smoked for 35 yearsAlcohol use- yes    Current Outpatient Prescriptions on File Prior to Visit  Medication Sig Dispense Refill  . ALPRAZolam (XANAX) 0.5 MG tablet take 1 tablet by mouth at bedtime  100 tablet  5  . amLODipine (NORVASC) 5 MG tablet Take 1 tablet (5 mg total) by mouth daily.  30 tablet  3  . aspirin 81 MG tablet Take 81 mg by mouth daily.        .  benazepril-hydrochlorthiazide (LOTENSIN HCT) 20-12.5 MG per tablet Take 1 tablet by mouth daily.  30 tablet  11  . Biotin 1000 MCG tablet Take 1,000 mcg by mouth daily.      . calcium elemental as carbonate (TUMS ULTRA 1000) 400 MG tablet Chew 1,000 mg by mouth at bedtime as needed.        . diclofenac (VOLTAREN) 75 MG EC tablet Take 75 mg by mouth daily.       Marland Kitchen EPINEPHrine (EPIPEN JR) 0.15 MG/0.3ML injection Inject 0.15 mg into the muscle as needed. For allergic reaction      . fish oil-omega-3 fatty acids 1000 MG capsule Take by mouth 2 (two) times daily.       . fluticasone (FLONASE) 50 MCG/ACT nasal spray Place 2 sprays into the nose as needed. For allergies       . hyoscyamine (LEVSIN SL) 0.125 MG SL tablet Place 0.125 mg under the tongue 3 (three) times daily as needed.      . loratadine (CLARITIN) 10 MG tablet Take 10 mg by mouth daily as needed. For allergies      . omeprazole (PRILOSEC) 20 MG capsule take 1 capsule by mouth once daily  90 capsule  3  . Probiotic Product (ALIGN) 4 MG CAPS Take 1 capsule by mouth daily.  28 capsule  0  . Red Yeast Rice Extract (RED YEAST RICE PO) Take 1 capsule by mouth daily.        . triazolam (HALCION) 0.25 MG tablet Take 0.5 tablets (0.125 mg total) by mouth at bedtime as needed.  30 tablet  5      Review of Systems System review is negative for any constitutional, cardiac, pulmonary, GI or neuro symptoms or complaints other than as described in the HPI.     Objective:   Physical Exam Filed Vitals:   09/13/11 1520  BP: 140/82  Pulse: 66  Temp: 97.8 F (36.6 C)  Resp: 16   Gen'l- WNWD white man in no distress HEENT- C&S clear Cor- RRR Pulm - normal respirations  Lab Results  Component Value Date   WBC 3.7* 08/17/2011   HGB 12.4* 08/17/2011   HCT 38.0* 08/17/2011   PLT 235 08/17/2011   GLUCOSE 110 08/17/2011   CHOL 235* 12/23/2008   TRIG 126.0 12/23/2008   HDL 49.30 12/23/2008   LDLDIRECT 165.7 12/23/2008   LDLCALC 102* 01/23/2008    ALT 16 05/21/2011   AST 26  08/17/2011   NA 137 08/17/2011   K 3.9 08/17/2011   CL 100 08/17/2011   CREATININE 0.9 08/17/2011   BUN 18 08/17/2011   CO2 30 08/17/2011   TSH 1.444 06/11/2011   PSA 1.79 07/29/2006   INR 0.98 02/23/2011          Assessment & Plan:

## 2011-09-14 ENCOUNTER — Ambulatory Visit (INDEPENDENT_AMBULATORY_CARE_PROVIDER_SITE_OTHER): Payer: Medicare Other | Admitting: Gastroenterology

## 2011-09-14 ENCOUNTER — Encounter: Payer: Self-pay | Admitting: Gastroenterology

## 2011-09-14 VITALS — BP 128/72 | HR 68 | Ht 67.0 in | Wt 183.0 lb

## 2011-09-14 DIAGNOSIS — R141 Gas pain: Secondary | ICD-10-CM

## 2011-09-14 DIAGNOSIS — K5730 Diverticulosis of large intestine without perforation or abscess without bleeding: Secondary | ICD-10-CM

## 2011-09-14 DIAGNOSIS — M549 Dorsalgia, unspecified: Secondary | ICD-10-CM

## 2011-09-14 DIAGNOSIS — K589 Irritable bowel syndrome without diarrhea: Secondary | ICD-10-CM

## 2011-09-14 DIAGNOSIS — R143 Flatulence: Secondary | ICD-10-CM

## 2011-09-14 DIAGNOSIS — K2270 Barrett's esophagus without dysplasia: Secondary | ICD-10-CM

## 2011-09-14 DIAGNOSIS — Z8719 Personal history of other diseases of the digestive system: Secondary | ICD-10-CM

## 2011-09-14 NOTE — Progress Notes (Signed)
This is a 78 year old Caucasian male who has completed chemoradiation treatment for non-Hodgkin's lymphoma. He has had problems with abdominal gas, bloating, and bowel dysfunction since his treatment. Colonoscopy previously had revealed severe diverticulosis, and endoscopy has shown a high hiatial hernia with Barrett's mucosa in his esophagus. He is reluctantly on daily Prilosec, and denies acid reflux symptoms or dysphagia. His also noted that the patient is on Voltaren and 75 mg a day and when necessary sublingual Levsin with daily probiotic therapy. He is on a very low gas diet and seems to be doing extremely well with 1-2 formed bowel movements a day without melena or hematochezia. He also denies abdominal pain, hepatobiliary, or systemic complaints. His appetite is good and he is slowly gaining weight.  Current Medications, Allergies, Past Medical History, Past Surgical History, Family History and Social History were reviewed in [employer] record.  Pertinent Review of Systems Negative.. recent low back pain with some sciatica-type discomfort. Followup CT scan of the abdomen is due on September 16.   Physical Exam: Healthy-appearing patient in no acute distress. Blood pressure 120/72, pulse 60 and regular, and weight 183 pounds with BMI of 28.66. I cannot appreciate stigmata of chronic liver disease. There is no organomegaly, abdominal masses, tenderness, or abnormal bowel sounds. Mental status is normal.    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 We will continue his low gas diet as tolerated with slow introduction of fiber into his diet as tolerated. I reviewed his recent back films with him, and have referred him to physical therapy because of his spondylosis. He is otherwise to continue all medications as listed and reviewed per his multiple physicians. I have urged him to  take his daily PPI medication. His wife was present with him throughout the interview and exam today. Please copy his multiple physicians with this note. Encounter Diagnosis  Name Primary?  . Back pain Yes

## 2011-09-14 NOTE — Patient Instructions (Addendum)
We have scheduled you an appointment at [facility] for 09/15/11 at 10:30am. Please arrive 15 minutes prior to your appointment time to register. Also bring your insurance cards and medication list.   You have been given a Low gas diet.   cc: Adella Hare, MD

## 2011-09-15 NOTE — Assessment & Plan Note (Signed)
BP Readings from Last 3 Encounters:  09/14/11 128/72  09/13/11 140/82  08/23/11 137/85   Adequate control on present regimen.

## 2011-09-15 NOTE — Assessment & Plan Note (Signed)
Reviewed symptoms of IBS- he agrees that he does have consistent symptoms.  Plan-directed to [email or web address] for more information  Recommended bulk laxative.

## 2011-09-15 NOTE — Assessment & Plan Note (Signed)
Reviewed last notes: good response to XRT . He is feeling fatigued and has low energy and is concerned about persistent symptoms this far out from treatment.  Plan  Follow up imaging per Dr. Beryle Beams - for regrowth he will be considered for chemo/immunologic therapy.

## 2011-09-17 ENCOUNTER — Encounter: Payer: Self-pay | Admitting: Internal Medicine

## 2011-09-21 ENCOUNTER — Other Ambulatory Visit: Payer: Self-pay | Admitting: Internal Medicine

## 2011-10-06 ENCOUNTER — Ambulatory Visit: Payer: Medicare Other | Admitting: Internal Medicine

## 2011-10-18 ENCOUNTER — Ambulatory Visit (HOSPITAL_COMMUNITY)
Admission: RE | Admit: 2011-10-18 | Discharge: 2011-10-18 | Disposition: A | Discharge status: Home / Self Care | Payer: Medicare Other | Source: Ambulatory Visit | Attending: Oncology | Admitting: Oncology

## 2011-10-18 ENCOUNTER — Encounter (HOSPITAL_COMMUNITY): Payer: Self-pay

## 2011-10-18 ENCOUNTER — Other Ambulatory Visit (HOSPITAL_BASED_OUTPATIENT_CLINIC_OR_DEPARTMENT_OTHER): Payer: Medicare Other

## 2011-10-18 ENCOUNTER — Other Ambulatory Visit: Payer: Self-pay | Admitting: Oncology

## 2011-10-18 DIAGNOSIS — N400 Enlarged prostate without lower urinary tract symptoms: Secondary | ICD-10-CM | POA: Insufficient documentation

## 2011-10-18 DIAGNOSIS — K5730 Diverticulosis of large intestine without perforation or abscess without bleeding: Secondary | ICD-10-CM | POA: Insufficient documentation

## 2011-10-18 DIAGNOSIS — I359 Nonrheumatic aortic valve disorder, unspecified: Secondary | ICD-10-CM | POA: Insufficient documentation

## 2011-10-18 DIAGNOSIS — C8589 Other specified types of non-Hodgkin lymphoma, extranodal and solid organ sites: Secondary | ICD-10-CM | POA: Insufficient documentation

## 2011-10-18 DIAGNOSIS — C8596 Non-Hodgkin lymphoma, unspecified, intrapelvic lymph nodes: Secondary | ICD-10-CM

## 2011-10-18 DIAGNOSIS — K429 Umbilical hernia without obstruction or gangrene: Secondary | ICD-10-CM | POA: Insufficient documentation

## 2011-10-18 LAB — CBC WITH DIFFERENTIAL/PLATELET
EOS%: 4.1 % (ref 0.0–7.0)
LYMPH%: 22.5 % (ref 14.0–49.0)
MCH: 27.9 pg (ref 27.2–33.4)
MCV: 84.4 fL (ref 79.3–98.0)
MONO%: 10.5 % (ref 0.0–14.0)
Platelets: 205 10*3/uL (ref 140–400)
RBC: 4.51 10*6/uL (ref 4.20–5.82)
RDW: 17.5 % — ABNORMAL HIGH (ref 11.0–14.6)

## 2011-10-18 LAB — COMPREHENSIVE METABOLIC PANEL (CC13)
AST: 18 U/L (ref 5–34)
Albumin: 3.6 g/dL (ref 3.5–5.0)
Alkaline Phosphatase: 71 U/L (ref 40–150)
BUN: 20 mg/dL (ref 7.0–26.0)
Potassium: 3.2 mEq/L — ABNORMAL LOW (ref 3.5–5.1)
Sodium: 142 mEq/L (ref 136–145)
Total Bilirubin: 0.3 mg/dL (ref 0.20–1.20)
Total Protein: 6.3 g/dL — ABNORMAL LOW (ref 6.4–8.3)

## 2011-10-18 MED ORDER — IOHEXOL 300 MG/ML  SOLN
100.0000 mL | Freq: Once | INTRAMUSCULAR | Status: AC | PRN
  Administered 2011-10-18: 100 mL via INTRAVENOUS

## 2011-10-22 ENCOUNTER — Ambulatory Visit (HOSPITAL_BASED_OUTPATIENT_CLINIC_OR_DEPARTMENT_OTHER): Payer: Medicare Other | Admitting: Oncology

## 2011-10-22 VITALS — BP 133/82 | HR 63 | Temp 98.5°F | Resp 20 | Ht 67.0 in | Wt 182.0 lb

## 2011-10-22 DIAGNOSIS — C8596 Non-Hodgkin lymphoma, unspecified, intrapelvic lymph nodes: Secondary | ICD-10-CM

## 2011-10-22 DIAGNOSIS — C8590 Non-Hodgkin lymphoma, unspecified, unspecified site: Secondary | ICD-10-CM

## 2011-10-22 DIAGNOSIS — R109 Unspecified abdominal pain: Secondary | ICD-10-CM

## 2011-10-22 NOTE — Progress Notes (Signed)
Hematology and Oncology Follow Up Visit  Ryan [name] [id number] 07-02-1931 78 y.o. 10/22/2011 7:12 PM   Principle Diagnosis: Encounter Diagnoses  Name Primary?  . 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 He was found to have bulky pelvic disease but no significant disease outside the pelvis on CT or PET scanning. A rare, small, lymphoid aggregate was seen in the bone marrow. After discussion of treatment options, I recommended involved field radiation. He completed treatments given between February 11 and March 8: 3600 cGy in 20 fractions. He had a significant response to radiation treatment. There is still some residual soft tissue density which has now been stable on a scan done in July and compared to the current study of September 16 which I personally reviewed with the patient and his wife today.  He continues to have significant weakness in major problems with his bowel. He gets postprandial gas and severe cramping. He had intermittent bowel symptoms prior to his lymphoma diagnosis. These have worsened. He had some initial relief by going on a gluten-free diet. Some additional improvement by going on a lactose free diet. He recently had a another consultation with a dietitian who reportedly analyzed his white blood cells and their reaction to various allergens and he was told that he needs to avoid all poultry products.  He is still getting some paresthesias/dysesthesias over his lateral right thigh.   Medications: reviewed  Allergies:  Allergies  Allergen Reactions  . Molds & Smuts Other (See Comments)    Also dust mites causes sinus infections, h/a etc.    . Pollen Extract-Tree Extract Other (See Comments)    HEADACHES, TIRED , DRAINAGE FROM SINUSES    Review of Systems: Constitutional:   Lack of endurance. Weakness with any moderate exertion. Respiratory: No cough or dyspnea Cardiovascular: No chest pain or palpitations  Gastrointestinal: See above Genito-Urinary: No urinary tract symptoms Musculoskeletal: No muscle or bone pain Neurologic: No headache or change in vision Skin: No rash or ecchymosis Remaining ROS negative.  Physical Exam: Blood pressure 133/82, pulse 63, temperature 98.5 F (36.9 C), temperature source Oral, resp. rate 20, height _0  (1.702 m), weight 182 lb (82.555 kg). Wt Readings from Last 3 Encounters:  10/22/11 182 lb (82.555 kg)  09/14/11 183 lb (83.008 kg)  09/13/11 183 lb (83.008 kg)     General appearance: Adequately nourished Caucasian man; pretreatment weight in February 1 185 pounds fell to a low of 180 during radiation now stable at 182-183 HENNT: No erythema or exudate Lymph nodes: No cervical, supraclavicular, axillary, or inguinal adenopathy Breasts: Lungs: Clear to auscultation resonant to percussion Heart: Regular rhythm no murmur Abdomen: Soft nontender, no mass, no organomegaly Extremities: No edema no calf tenderness Vascular: No cyanosis Neurologic: Motor strength is 5 over 5 reflexes 1+ symmetric Skin: No rash or ecchymosis  Lab Results: Lab Results  Component Value Date   WBC 3.5* 10/18/2011   HGB 12.6* 10/18/2011   HCT 38.1* 10/18/2011   MCV 84.4 10/18/2011   PLT 205 10/18/2011     Chemistry      Component Value Date/Time   NA 142 10/18/2011 0815   NA 137 08/17/2011 1043   NA  142 05/21/2011 1035   K 3.2* 10/18/2011 0815   K 3.9 08/17/2011 1043   K 3.7 05/21/2011 1035   CL 105 10/18/2011 0815   CL 100 08/17/2011 1043   CL 106 05/21/2011 1035   CO2 27 10/18/2011 0815   CO2 30 08/17/2011 1043   CO2 26 05/21/2011 1035   BUN 20.0 10/18/2011 0815   BUN 18 08/17/2011 1043   BUN 16  05/21/2011 1035   CREATININE 0.9 10/18/2011 0815   CREATININE 0.9 08/17/2011 1043   CREATININE 1.03 05/21/2011 1035      Component Value Date/Time   CALCIUM 9.0 10/18/2011 0815   CALCIUM 8.4 08/17/2011 1043   CALCIUM 9.0 05/21/2011 1035   ALKPHOS 71 10/18/2011 0815   ALKPHOS 68 08/17/2011 1043   ALKPHOS 65 05/21/2011 1035   AST 18 10/18/2011 0815   AST 26 08/17/2011 1043   AST 17 05/21/2011 1035   ALT 15 10/18/2011 0815   ALT 16 05/21/2011 1035   BILITOT 0.30 10/18/2011 0815   BILITOT 0.60 08/17/2011 1043   BILITOT 0.3 05/21/2011 1035       Radiological Studies: Ct Abdomen Pelvis W Contrast  10/18/2011  *RADIOLOGY REPORT*  Clinical Data: Lymphoma restaging  CT ABDOMEN AND PELVIS WITH CONTRAST  Technique:  Multidetector CT imaging of the abdomen and pelvis was performed following the standard protocol during bolus administration of intravenous contrast.  Contrast: 1109m OMNIPAQUE IOHEXOL 300 MG/ML  SOLN  Comparison: 08/17/2011, 02/10/2011  Findings: Aortic valve calcification.  Normal heart size. Intrathoracic stomach with organoaxial rotation, similar to prior. No evidence for obstruction.  Mild left lower lobe scarring versus atelectasis.  Subcentimeter hypodensity within the hepatic dome is nonspecific though similar in size to priors.  Otherwise homogeneous hepatic and splenic enhancement.  Unremarkable biliary system, pancreas, adrenal glands.  Hypodensities exophytic from the right kidney measure water attenuation and are most in keeping with cyst.  There are a couple too small further characterize hypodensities within each kidney. No hydronephrosis or hydroureter.  Colonic diverticulosis.  No CT evidence for diverticulitis.  No bowel obstruction.  Normal appendix.  Fat containing supraumbilical hernia with fluid along the left margin is unchanged.  Infiltrative soft tissue attenuation encasing the inferior IVC and adjacent common iliac vessels is similar to prior.  Index measurement at a similar level  to prior measures 3.8 x 6.1 cm as compared to 4.0 x 5.9 cm previously. Due to contrast bolus timing, I cannot confirm patency of the common or external right iliac veins.  Decompressed bladder.  Mild prostatomegaly.  Fat containing right inguinal hernias.  Left SI joint degenerative change.  Multilevel degenerative changes of the lumbar spine.  No acute osseous finding.  IMPRESSION: Infiltrative soft tissue encasing the inferior IVC/common iliac vasculature is similar to prior.  While no definitive evidence for thrombus, due to contrast bolus timing, I cannot confirm patency of the right common and external iliac veins.   Original Report Authenticated By: ASuanne Marker M.D.     Impression and Plan: #1. Low-grade, B-cell, non-Hodgkin's lymphoma limited to the pelvis Excellent response to involved field radiation. Stable, residual, soft tissue mass in area of treatment. Plan: I will get a followup CT scan again in 6 months. I want to repeat a PET scan if there any changes on the CT  #2. Acute on chronic GI complaints. I will defer management to his primary care physician and his gastroenterologist.  #3. Degenerative arthritis of the spine which is also adding to his  discomfort level   CC:. Dr. Adella Hare; Dr. Verl Blalock; Dr. Peyton Bottoms   Annia Belt, MD 9/20/20137:12 PM

## 2011-10-27 ENCOUNTER — Telehealth: Payer: Self-pay | Admitting: Oncology

## 2011-10-27 NOTE — Telephone Encounter (Signed)
S/w the pt and he is aware of his march 2014 appts and the ct scan appt. Pt is aware to stop by the scheduling desk to pick up the oral contrast prior to the scan appts

## 2011-11-09 ENCOUNTER — Other Ambulatory Visit: Payer: Self-pay | Admitting: Internal Medicine

## 2011-12-08 ENCOUNTER — Encounter: Payer: Self-pay | Admitting: Internal Medicine

## 2011-12-08 ENCOUNTER — Ambulatory Visit (INDEPENDENT_AMBULATORY_CARE_PROVIDER_SITE_OTHER): Payer: Medicare Other | Admitting: Internal Medicine

## 2011-12-08 VITALS — BP 130/80 | HR 83 | Temp 96.8°F | Resp 14 | Ht 67.5 in | Wt 184.4 lb

## 2011-12-08 DIAGNOSIS — C8589 Other specified types of non-Hodgkin lymphoma, extranodal and solid organ sites: Secondary | ICD-10-CM

## 2011-12-08 DIAGNOSIS — C8590 Non-Hodgkin lymphoma, unspecified, unspecified site: Secondary | ICD-10-CM

## 2011-12-08 DIAGNOSIS — M1990 Unspecified osteoarthritis, unspecified site: Secondary | ICD-10-CM

## 2011-12-08 DIAGNOSIS — I10 Essential (primary) hypertension: Secondary | ICD-10-CM

## 2011-12-08 NOTE — Patient Instructions (Addendum)
Management of arthritic pain - hands and back:  Heat - submersion of hands, hot tube/whirlpool, gloves etc.  Medications -    Tylenol - taken three times a day on schedule: 500 to 1,036m is safe re: liver disease   Diclofenac - taken twice a day. GI precautions - continue prilosec and beano; watch for irritation  Mechanical - flex and stretch exercises EVERY day   Massage therapy as needed - acute treatment more often, maintenance less often.      Oncology -  Good report from Dr. GBeryle Beams CT in March or April '14

## 2011-12-11 NOTE — Assessment & Plan Note (Signed)
Management of arthritic pain - hands and back:  Heat - submersion of hands, hot tube/whirlpool, gloves etc.  Medications -    Tylenol - taken three times a day on schedule: 500 to 1,031m is safe re: liver disease   Diclofenac - taken twice a day. GI precautions - continue prilosec and beano; watch for irritation  Mechanical - flex and stretch exercises EVERY day   Massage therapy as needed - acute treatment more often, maintenance less often.

## 2011-12-11 NOTE — Assessment & Plan Note (Signed)
Has been doing very well.  Plan - per Dr. Beryle Beams

## 2011-12-11 NOTE — Assessment & Plan Note (Signed)
BP Readings from Last 3 Encounters:  12/08/11 130/80  10/22/11 133/82  09/14/11 128/72   Adequate control on present regimen

## 2011-12-11 NOTE — Progress Notes (Signed)
Subjective:    Patient ID: Ryan [name], male    DOB: Aug 30, 1931, 78 y.o.   MRN: [id number]  HPI Ryan [name] brought me up to date with his treatment and progress for lymphoma - appears to be in remission and doing well.  His GI function is much better on a gluten free diet.   He does have on-going arthritic pain in back and hands. He is somewhat limited in his activities by this.  Past Medical History  Diagnosis Date  . Hyperlipemia   . Hypertension   . Benign prostatic hypertrophy   . Chronic fatigue   . Lactose intolerance   . Sleep disorder   . Personal history of colonic polyps 2004    hyperplastic Dr. Collene Mares  . Gluten intolerance   . Barrett's esophagus   . Esophageal reflux   . Diverticulosis of colon (without mention of hemorrhage)   . Periaortic lymphadenopathy 02/16/2011  . Atelectasis, bilateral 02/10/11    Noted On CT Scan - Mild Dependent Atelectasis at the Lung Bases  . Hiatal hernia 02/10/11    Noted on CT Scan - Moderate Hiatal Hernia  . Renal cyst 02/10/11     Noted on CT Scan - Bilateral Renal Cysts  . Osteoarthritis     hands/feet,knees,  . S/P radiation therapy 03/15/11 - 04/09/11    Abdominal/ Pelvic Tumor, 3600 cGy/20 Fractions  . Microcytic anemia 04/20/2011   . Abdominal pain     Bloating and gas  . Gastritis   . Internal hemorrhoids   . Melanoma     lymphoma  . 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 Rotator cuff repair     right  . Hernia repair   . Cataract extraction, bilateral   . Tonsillectomy   . Arthroscopic repair acl     right  . Bone marrow aspiration 02/25/11    Bone Marrow, Aspirate, Clot, and Bilateral Bx, Right PIC   Family History  Problem Relation Age of Onset  . Heart disease Father     MI 98  . Prostate cancer Brother   . Prostate cancer Paternal  Uncle   . Prostate cancer Paternal Uncle   . Prostate cancer Paternal Uncle   . Colon cancer Neg Hx   . Hyperlipidemia    . Stroke    . Hypertension    . ADD / ADHD     History   Social History  . Marital Status: Married    Spouse Name: N/A    Number of Children: 2  . Years of Education: N/A   Occupational History  . 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 Smoking status: Former Smoker -- 35 years    Types: Pipe    Quit date: 02/23/1996  . Smokeless tobacco: Never Used     Comment: quit 20 years ago  . Alcohol Use: Yes     Comment: 2 drinks daily scotch   . Drug Use: No  . Sexually Active: Not on file   Other Topics Concern  . 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 ALPRAZolam (XANAX) 0.5 MG tablet take 1 tablet by mouth at bedtime  30 tablet  5  . amLODipine (NORVASC) 5 MG tablet Take 1 tablet (5 mg total) by mouth daily.  30 tablet  3  . aspirin 81 MG tablet Take 81 mg by mouth daily.        Marland Kitchen b complex vitamins capsule Take 1 capsule by mouth daily.      . benazepril-hydrochlorthiazide (LOTENSIN HCT) 20-12.5 MG per tablet Take 1 tablet by mouth daily.  30 tablet  11  . Biotin 1000 MCG tablet Take 1,000 mcg by mouth daily.      . Bismuth Subsalicylate (PEPTO-BISMOL PO) Take by mouth as needed.      . calcium elemental as carbonate (TUMS ULTRA 1000) 400 MG tablet Chew 1,000 mg by mouth at bedtime as needed.        . diclofenac (VOLTAREN) 75 MG EC tablet Take 75 mg by mouth daily.       Marland Kitchen EPINEPHrine (EPIPEN JR) 0.15 MG/0.3ML injection Inject 0.15 mg into the muscle as needed. For allergic reaction      . Fiber POWD Take by mouth as  needed.      . fish oil-omega-3 fatty acids 1000 MG capsule Take by mouth 2 (two) times daily.       . fluticasone (FLONASE) 50 MCG/ACT nasal spray Place 2 sprays into the nose as needed. For allergies       . hyoscyamine (LEVSIN SL) 0.125 MG SL tablet Place 0.125 mg under the tongue 3 (three) times daily as needed.      . loratadine (CLARITIN) 10 MG tablet Take 10 mg by mouth daily as needed. For allergies      . omeprazole (PRILOSEC) 20 MG capsule take 1 capsule by mouth once daily  90 capsule  3  . Probiotic Product (ALIGN) 4 MG CAPS Take 1 capsule by mouth daily.  28 capsule  0  . Red Yeast Rice Extract (RED YEAST RICE PO) Take 1 capsule by mouth daily.        . triazolam (HALCION) 0.25 MG tablet Take 0.5 tablets (0.125 mg total) by mouth at bedtime as needed.  30 tablet  5      Review of Systems System review is negative for any constitutional, cardiac, pulmonary, GI or neuro symptoms or complaints other than as described in the HPI.     Objective:   Physical Exam Filed Vitals:   12/08/11 0947  BP: 130/80  Pulse: 83  Temp: 96.8 F (36 C)  Resp: 14   Gen'l - WNWD mildly overweight white man in no distress HEENT- C&S clear Cor- RRR Pulm - normal respirations. Abd - protuberant.         Assessment & Plan:  (greater than 50% of 25 min visit spent on education and counseling)

## 2011-12-13 ENCOUNTER — Telehealth: Payer: Self-pay

## 2011-12-13 ENCOUNTER — Other Ambulatory Visit: Payer: Self-pay | Admitting: Internal Medicine

## 2011-12-13 MED ORDER — FINASTERIDE 5 MG PO TABS: 5.0000 mg | ORAL_TABLET | Freq: Every day | ORAL | Status: DC

## 2011-12-13 NOTE — Telephone Encounter (Signed)
Pt called stating he was advised by [facility]EN that he no longer needs to see Urology,. Pt is requesting [facility]EN manage Proscar 5 mg, okay to refill?

## 2011-12-13 NOTE — Telephone Encounter (Signed)
Ok to refill proscar prn

## 2012-01-13 ENCOUNTER — Other Ambulatory Visit: Payer: Self-pay | Admitting: Internal Medicine

## 2012-01-13 ENCOUNTER — Other Ambulatory Visit: Payer: Self-pay | Admitting: Endocrinology

## 2012-01-13 ENCOUNTER — Other Ambulatory Visit: Payer: Self-pay | Admitting: *Deleted

## 2012-01-14 ENCOUNTER — Other Ambulatory Visit: Payer: Self-pay | Admitting: Endocrinology

## 2012-01-14 ENCOUNTER — Telehealth: Payer: Self-pay | Admitting: *Deleted

## 2012-01-14 NOTE — Telephone Encounter (Signed)
Pt called stating that he wanted to be sure that our office knows that he is under an oncologist care and that his Harold was 12/08/11 and he does not need to come in for his meds to be refilled per dr.

## 2012-02-08 ENCOUNTER — Other Ambulatory Visit: Payer: Self-pay | Admitting: Internal Medicine

## 2012-03-21 ENCOUNTER — Ambulatory Visit (INDEPENDENT_AMBULATORY_CARE_PROVIDER_SITE_OTHER): Payer: Medicare Other | Admitting: Internal Medicine

## 2012-03-21 ENCOUNTER — Encounter: Payer: Self-pay | Admitting: Internal Medicine

## 2012-03-21 VITALS — BP 136/88 | HR 74 | Temp 97.8°F | Resp 10 | Wt 188.0 lb

## 2012-03-21 DIAGNOSIS — N281 Cyst of kidney, acquired: Secondary | ICD-10-CM

## 2012-03-21 DIAGNOSIS — Q619 Cystic kidney disease, unspecified: Secondary | ICD-10-CM

## 2012-03-21 NOTE — Patient Instructions (Addendum)
Multiple small renal cysts - of no pathologic significance. We call this an incidentaloma. No need for any follow up  The skin lesion on your back is totally is benign.  It sounds like the Lymphoma is doing well.

## 2012-03-22 NOTE — Progress Notes (Signed)
Subjective:    Patient ID: Ryan [name], male    DOB: July 10, 1931, 79 y.o.   MRN: [id number]  HPI Mr. Cupps, along with his wife, presents to discuss recent incidental finding on MRI L-S spine of multiple small renal cysts. He is asymptomatic and has not had any previous kidney trouble.   He has a chronic skin change at the low back which has recently changed  Past Medical History  Diagnosis Date  . Hyperlipemia   . Hypertension   . Benign prostatic hypertrophy   . Chronic fatigue   . Lactose intolerance   . Sleep disorder   . Personal history of colonic polyps 2004    hyperplastic Dr. Collene Mares  . Gluten intolerance   . Barrett's esophagus   . Esophageal reflux   . Diverticulosis of colon (without mention of hemorrhage)   . Periaortic lymphadenopathy 02/16/2011  . Atelectasis, bilateral 02/10/11    Noted On CT Scan - Mild Dependent Atelectasis at the Lung Bases  . Hiatal hernia 02/10/11    Noted on CT Scan - Moderate Hiatal Hernia  . Renal cyst 02/10/11     Noted on CT Scan - Bilateral Renal Cysts  . Osteoarthritis     hands/feet,knees,  . S/P radiation therapy 03/15/11 - 04/09/11    Abdominal/ Pelvic Tumor, 3600 cGy/20 Fractions  . Microcytic anemia 04/20/2011   . Abdominal pain     Bloating and gas  . Gastritis   . Internal hemorrhoids   . Melanoma     lymphoma  . 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 Rotator cuff repair      right  . Hernia repair    . Cataract extraction, bilateral    . Tonsillectomy    . Arthroscopic repair acl      right  . Bone marrow aspiration  02/25/11    Bone Marrow, Aspirate, Clot, and Bilateral Bx, Right PIC   Family History  Problem Relation Age of Onset  . Heart disease Father     MI 82  . Prostate cancer Brother   . Prostate cancer Paternal Uncle    . Prostate cancer Paternal Uncle   . Prostate cancer Paternal Uncle   . Colon cancer Neg Hx   . Hyperlipidemia    . Stroke    . Hypertension    . ADD / ADHD     History   Social History  . Marital Status: Married    Spouse Name: N/A    Number of Children: 2  . Years of Education: N/A   Occupational History  . 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 Smoking status: Former Smoker -- 35 years    Types: Pipe    Quit date: 02/23/1992  . Smokeless tobacco: Never Used     Comment: quit 20 years ago  . Alcohol Use: Yes     Comment: 2 drinks daily scotch   . Drug Use: No  . Sexually Active: Not on file   Other Topics Concern  . 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 ALPRAZolam (XANAX) 0.5 MG tablet take 1 tablet by mouth at bedtime  30 tablet  5  . amLODipine (NORVASC) 5 MG tablet Take 1 tablet (5 mg total) by mouth daily.  30 tablet  3  . amLODipine (NORVASC) 5 MG tablet Take one tablet by mouth daily  90 tablet  3  . aspirin 81 MG tablet Take 81 mg by mouth daily.        Marland Kitchen b complex vitamins capsule Take 1 capsule by mouth daily.      . benazepril-hydrochlorthiazide (LOTENSIN HCT) 20-12.5 MG per tablet take 1 tablet by mouth once daily  30 tablet  11  . Biotin 1000 MCG tablet Take 1,000 mcg by mouth daily.      . Bismuth Subsalicylate (PEPTO-BISMOL PO) Take by mouth as needed.      . calcium elemental as carbonate (TUMS ULTRA 1000) 400 MG tablet Chew 1,000 mg by mouth at bedtime as needed.        . diclofenac (VOLTAREN) 75 MG EC tablet Take 75 mg by mouth daily.       . diclofenac (VOLTAREN) 75 MG EC tablet take 1  tablet by mouth twice a day with food if needed  60 tablet  1  . EPINEPHrine (EPIPEN JR) 0.15 MG/0.3ML injection Inject 0.15 mg into the muscle as needed. For allergic reaction      . Fiber POWD Take by mouth as needed.      . finasteride (PROSCAR) 5 MG tablet Take 1 tablet (5 mg total) by mouth daily.  30 tablet  11  . fish oil-omega-3 fatty acids 1000 MG capsule Take by mouth 2 (two) times daily.       . fluticasone (FLONASE) 50 MCG/ACT nasal spray Place 2 sprays into the nose as needed. For allergies       . hyoscyamine (LEVSIN SL) 0.125 MG SL tablet Place 0.125 mg under the tongue 3 (three) times daily as needed.      . loratadine (CLARITIN) 10 MG tablet Take 10 mg by mouth daily as needed. For allergies      . omeprazole (PRILOSEC) 20 MG capsule take 1 capsule by mouth once daily  90 capsule  3  . Probiotic Product (ALIGN) 4 MG CAPS Take 1 capsule by mouth daily.  28 capsule  0  . Red Yeast Rice Extract (RED YEAST RICE PO) Take 1 capsule by mouth daily.        . triazolam (HALCION) 0.25 MG tablet Take 1 tablet (0.25 mg total) by mouth at bedtime as needed.  30 tablet  2   No current facility-administered medications on file prior to visit.     Review of Systems System review is negative for any constitutional, cardiac, pulmonary, GI or neuro symptoms or complaints other than as described in the HPI.     Objective:   Physical Exam Filed Vitals:   03/21/12 1033  BP: 136/88  Pulse: 74  Temp: 97.8 F (36.6 C)  Resp: 10   Gen'l - WNWD white man HEENT_ C&S clear Cor - 2+ radial, RRR Pulm - normal respirations Derm - at the low back several raised, mildly erythematous lesions that have smooth edge and no pigmentation.       Assessment & Plan:  Renal cysts -  patient has normal renal function.   Plan Reassurance that small cysts in the kidney are common and benign

## 2012-04-07 ENCOUNTER — Ambulatory Visit (HOSPITAL_COMMUNITY)
Admission: RE | Admit: 2012-04-07 | Discharge: 2012-04-07 | Disposition: A | Discharge status: Home / Self Care | Payer: Medicare Other | Source: Ambulatory Visit | Attending: Oncology | Admitting: Oncology

## 2012-04-07 ENCOUNTER — Other Ambulatory Visit (HOSPITAL_BASED_OUTPATIENT_CLINIC_OR_DEPARTMENT_OTHER): Payer: Medicare Other

## 2012-04-07 ENCOUNTER — Other Ambulatory Visit: Payer: Self-pay | Admitting: *Deleted

## 2012-04-07 DIAGNOSIS — K449 Diaphragmatic hernia without obstruction or gangrene: Secondary | ICD-10-CM | POA: Insufficient documentation

## 2012-04-07 DIAGNOSIS — C8590 Non-Hodgkin lymphoma, unspecified, unspecified site: Secondary | ICD-10-CM

## 2012-04-07 DIAGNOSIS — Z09 Encounter for follow-up examination after completed treatment for conditions other than malignant neoplasm: Secondary | ICD-10-CM | POA: Insufficient documentation

## 2012-04-07 DIAGNOSIS — D649 Anemia, unspecified: Secondary | ICD-10-CM

## 2012-04-07 DIAGNOSIS — C8596 Non-Hodgkin lymphoma, unspecified, intrapelvic lymph nodes: Secondary | ICD-10-CM

## 2012-04-07 DIAGNOSIS — K5730 Diverticulosis of large intestine without perforation or abscess without bleeding: Secondary | ICD-10-CM | POA: Insufficient documentation

## 2012-04-07 DIAGNOSIS — D539 Nutritional anemia, unspecified: Secondary | ICD-10-CM | POA: Insufficient documentation

## 2012-04-07 DIAGNOSIS — N281 Cyst of kidney, acquired: Secondary | ICD-10-CM | POA: Insufficient documentation

## 2012-04-07 DIAGNOSIS — K439 Ventral hernia without obstruction or gangrene: Secondary | ICD-10-CM | POA: Insufficient documentation

## 2012-04-07 LAB — CBC WITH DIFFERENTIAL/PLATELET
BASO%: 0.6 % (ref 0.0–2.0)
Basophils Absolute: 0 10*3/uL (ref 0.0–0.1)
EOS%: 3.4 % (ref 0.0–7.0)
HGB: 12.9 g/dL — ABNORMAL LOW (ref 13.0–17.1)
MCH: 27.6 pg (ref 27.2–33.4)
MCHC: 33.3 g/dL (ref 32.0–36.0)
MCV: 83.1 fL (ref 79.3–98.0)
MONO%: 9.4 % (ref 0.0–14.0)
RBC: 4.67 10*6/uL (ref 4.20–5.82)
RDW: 16.1 % — ABNORMAL HIGH (ref 11.0–14.6)
lymph#: 1.1 10*3/uL (ref 0.9–3.3)

## 2012-04-07 LAB — COMPREHENSIVE METABOLIC PANEL (CC13)
ALT: 31 U/L (ref 0–55)
AST: 26 U/L (ref 5–34)
Albumin: 3.7 g/dL (ref 3.5–5.0)
Alkaline Phosphatase: 81 U/L (ref 40–150)
BUN: 24.6 mg/dL (ref 7.0–26.0)
Chloride: 105 mEq/L (ref 98–107)
Potassium: 3.7 mEq/L (ref 3.5–5.1)

## 2012-04-09 LAB — VITAMIN B12: Vitamin B-12: 472 pg/mL (ref 211–911)

## 2012-04-12 ENCOUNTER — Telehealth: Payer: Self-pay | Admitting: *Deleted

## 2012-04-12 NOTE — Telephone Encounter (Signed)
Message copied by Jesse Fall on Wed Apr 12, 2012  3:33 PM ------      Message from: Annia Belt      Created: Fri Apr 07, 2012 12:01 PM       Call pt - lymph node mass no change in size from previous scan; no new areas of concern ------

## 2012-04-12 NOTE — Telephone Encounter (Signed)
Pt notified of CT results per Dr Beryle Beams & he reports that he has a f/u visit soon & was able to repeat information given to him.

## 2012-04-18 ENCOUNTER — Telehealth: Payer: Self-pay | Admitting: Oncology

## 2012-04-18 ENCOUNTER — Encounter: Payer: Self-pay | Admitting: Internal Medicine

## 2012-04-18 ENCOUNTER — Ambulatory Visit (HOSPITAL_BASED_OUTPATIENT_CLINIC_OR_DEPARTMENT_OTHER): Payer: Medicare Other | Admitting: Oncology

## 2012-04-18 VITALS — BP 143/86 | HR 83 | Temp 97.0°F | Resp 18 | Ht 67.5 in | Wt 184.1 lb

## 2012-04-18 DIAGNOSIS — C8590 Non-Hodgkin lymphoma, unspecified, unspecified site: Secondary | ICD-10-CM

## 2012-04-18 DIAGNOSIS — C8596 Non-Hodgkin lymphoma, unspecified, intrapelvic lymph nodes: Secondary | ICD-10-CM

## 2012-04-18 DIAGNOSIS — R590 Localized enlarged lymph nodes: Secondary | ICD-10-CM

## 2012-04-18 DIAGNOSIS — M479 Spondylosis, unspecified: Secondary | ICD-10-CM

## 2012-04-18 DIAGNOSIS — M549 Dorsalgia, unspecified: Secondary | ICD-10-CM

## 2012-04-18 NOTE — Telephone Encounter (Signed)
Gave pt appt for September 2014 lab and MD , gave pt oral contrast

## 2012-04-19 NOTE — Progress Notes (Signed)
Hematology and Oncology Follow Up Visit  Ryan [name] [id number] 1931-09-06 79 y.o. 04/19/2012 4:25 PM   Principle Diagnosis: Encounter Diagnoses  Name Primary?  . 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 NHL (non-Hodgkin's lymphoma)   . 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 He was found to have bulky pelvic disease but no significant disease outside the pelvis on CT or PET scanning. A rare, small, lymphoid aggregate was seen in the bone marrow. After discussion of treatment options, I recommended involved field radiation. He completed treatments given between February 11 and April 09, 2011 with  3600 cGy in 20 fractions.  He had a significant response to radiation treatment. There is still some residual soft tissue density which has now been stable on  scans done in July and September 2013 and current scan done 04/07/12  which I personally reviewed with the patient and his wife today. Some of the scans seemed to be filed incorrectly. A scan dated 07/30/2005 and read as normal with no lymphadenopathy appears to show an area of soft tissue density in the same area as the bulky lymphadenopathy first described in January 2013. I will need to review the studies with the radiologist to confirm.  His performance status was slowly but steadily improving following the radiation but he has now developed progressive back pain and has already required 2 epidural steroid injections. He is being evaluated by orthopedic surgery, Dr. Rolena Infante.  Medications: reviewed  Allergies:  Allergies  Allergen Reactions  . Molds & Smuts Other (See Comments)    Also dust mites causes sinus infections, h/a etc.  . Pollen Extract-Tree Extract Other (See  Comments)    HEADACHES, TIRED , DRAINAGE FROM SINUSES    Review of Systems: Constitutional:   Appetite is good and weight is stable Respiratory: No cough or dyspnea Cardiovascular:  No chest pain or palpitations Gastrointestinal: Resolved abdominal pain; he had significant relief of his gas problem using "beano" enzymes which I recommended to him at time of his last visit. Genito-Urinary: No urinary tract symptoms Musculoskeletal: See above Neurologic: Resolved lumbosacral radiculopathy following radiation of his lymphoma Skin: No rash or ecchymosis Remaining ROS negative.  Physical Exam: Blood pressure 143/86, pulse 83, temperature 97 F (36.1 C), temperature source Oral, resp. rate 18, height 5' 7.5" (1.715 m), weight 184 lb 1.6 oz (83.507 kg). Wt Readings from Last 3 Encounters:  04/18/12 184 lb 1.6 oz (83.507 kg)  03/21/12 188 lb (85.276 kg)  12/08/11 184 lb 6 oz (83.632 kg)     General appearance: Well-nourished Caucasian man HENNT: Pharynx no erythema or exudate Lymph nodes: No cervical, supraclavicular, axillary, or inguinal adenopathy Breasts: Lungs: Clear to auscultation resonant to percussion Heart: Regular rhythm no murmur Abdomen: Soft, nontender, no mass, no organomegaly Extremities: No edema, no calf tenderness Vascular: No cyanosis Neurologic: Motor strength 5 over 5, reflexes 1+ symmetric Skin: No rash or ecchymosis  Lab Results: Lab Results  Component Value Date   WBC 4.0 04/07/2012   HGB 12.9* 04/07/2012   HCT 38.8 04/07/2012   MCV 83.1 04/07/2012   PLT 231 04/07/2012     Chemistry      Component Value Date/Time   NA 142 04/07/2012 0925   NA 137 08/17/2011 1043  NA 142 05/21/2011 1035   K 3.7 04/07/2012 0925   K 3.9 08/17/2011 1043   K 3.7 05/21/2011 1035   CL 105 04/07/2012 0925   CL 100 08/17/2011 1043   CL 106 05/21/2011 1035   CO2 26 04/07/2012 0925   CO2 30 08/17/2011 1043   CO2 26 05/21/2011 1035   BUN 24.6 04/07/2012 0925   BUN 18 08/17/2011 1043   BUN 16  05/21/2011 1035   CREATININE 1.0 04/07/2012 0925   CREATININE 0.9 08/17/2011 1043   CREATININE 1.03 05/21/2011 1035      Component Value Date/Time   CALCIUM 8.7 04/07/2012 0925   CALCIUM 8.4 08/17/2011 1043   CALCIUM 9.0 05/21/2011 1035   ALKPHOS 81 04/07/2012 0925   ALKPHOS 68 08/17/2011 1043   ALKPHOS 65 05/21/2011 1035   AST 26 04/07/2012 0925   AST 26 08/17/2011 1043   AST 17 05/21/2011 1035   ALT 31 04/07/2012 0925   ALT 16 05/21/2011 1035   BILITOT 0.71 04/07/2012 0925   BILITOT 0.60 08/17/2011 1043   BILITOT 0.3 05/21/2011 1035       Radiological Studies: Ct Abdomen Pelvis Wo Contrast  04/07/2012  *RADIOLOGY REPORT*  Clinical Data: Follow up history of treated pelvic lymphoma  CT ABDOMEN AND PELVIS WITHOUT CONTRAST  Technique:  Multidetector CT imaging of the abdomen and pelvis was performed following the standard protocol without intravenous contrast.  Comparison: 10/18/2011  Findings: The visualized portions of the lung bases are clear. There is a moderate-to-large hiatal hernia with or dental axilla rotation, stable.  Sub-centimeter hypodensity anterior dome of the liver on the right is stable, nonspecific, but likely benign.  Gallbladder, spleen, pancreas, adrenal glands normal.  No new sub centimeter low attenuation areas in the kidneys not seen without contrast.  Two previously described mid pole right renal cysts are unchanged.  A nonobstructive bowel gas pattern.  Diverticulosis descending and sigmoid colon without diverticulitis.  Supra umbilical fat containing abdominal wall hernia stable.  Bladder and reproductive organs normal.  No free fluid.  No significant mesenteric or retroperitoneal adenopathy.  However, infiltrative density involving the distal inferior vena cava and right iliac vein again identified and unchanged.  Measured at the same level as on prior study, diameter of 36.8 x 60.8 mm obtained, stable from prior study.  No acute musculoskeletal findings. Degenerative change left  sacroiliac joint, mild, is stable.  IMPRESSION: Persistent stable infiltrative density involving distal inferior vena cava and right iliac vein.  Stable diverticulosis and hiatal hernia.   Original Report Authenticated By: Skipper Cliche, M.D.    Dg Chest 2 View  04/07/2012  *RADIOLOGY REPORT*  Clinical Data: Pelvic lymphoma, hypertension, prior smoker  CHEST - 2 VIEW  Comparison: 03/31/2011  Findings: Very large hiatal hernia evident projecting over the left cardiac border.  Normal heart size and vascularity.  Azygos fissure noted.  Prior right rotator cuff repair evident.  No definite acute airspace process, collapse, consolidation, pneumonia, edema, effusion, pneumothorax.  Trachea midline.  Degenerative changes of the upper lumbar spine.  IMPRESSION: Stable exam.  Large hiatal hernia without acute process.   Original Report Authenticated By: Jerilynn Mages. Annamaria Boots, M.D.     Impression and Plan: #1. Localized but bulky low-grade B-cell non-Hodgkin's lymphoma. Excellent response to radiation stable residual soft tissue density over serial scans since July 2013. I will see him again in 6 months and repeat CT scans at that time.  #2. Chronic GI complaints.  #3. Progressive degenerative arthritis of the spine  CC:. Dr. Adella Hare, Verl Blalock, Blair Promise, MD 3/19/20144:25 PM

## 2012-04-26 ENCOUNTER — Ambulatory Visit: Payer: Medicare Other | Admitting: Internal Medicine

## 2012-05-04 ENCOUNTER — Ambulatory Visit (INDEPENDENT_AMBULATORY_CARE_PROVIDER_SITE_OTHER): Payer: Medicare Other | Admitting: Internal Medicine

## 2012-05-04 ENCOUNTER — Encounter: Payer: Self-pay | Admitting: Internal Medicine

## 2012-05-04 ENCOUNTER — Telehealth: Payer: Self-pay | Admitting: *Deleted

## 2012-05-04 VITALS — BP 146/90 | HR 73 | Temp 97.5°F | Resp 12 | Ht 67.5 in | Wt 178.8 lb

## 2012-05-04 DIAGNOSIS — R109 Unspecified abdominal pain: Secondary | ICD-10-CM

## 2012-05-04 NOTE — Telephone Encounter (Signed)
Ryan [name]

## 2012-05-04 NOTE — Patient Instructions (Addendum)
Intermittent severe abdominal pain, that is actually better since taking digestive enzymes. However, he will get severe pain in association with bigger meals. No change in stool, no clay colored stool or steatorrhea. Concern is for possible Gallbladder disease although not a classic presentation. Alternative diagnosis is possible mesenteric ischemia = peripheral vascular disease of the gut.  Plan  Continue enzymes  Abdominal ultra-sound  If ultra-sound is normal will consider CT angiogram to rule out mesenteric ischemia.   Lymphoma - on reviewing the '07 CT abdomen the report does not identify a mass and I do not see a mass on the images.   Chronic Mesenteric Ischemia Mesentery refers to the tissues that connect the blood vessels to the intestines. Ischemia refers to a restriction in blood supply. Mesenteric ischemia happens when an artery or vein that supports the intestine becomes blocked or narrow. Chronic mesenteric ischemia (CMI) develops gradually and is a long-term condition. This is sometimes referred to as "intestinal angina." CAUSES  CMI occurs when fatty deposits are building up in an artery or vein, but have not yet restricted blood flow entirely. It can also be caused by a difference in some people's anatomy or by a large, rapid weight loss. When blood supply to the intestine is severely restricted, needed oxygen cannot reach the intestines for proper function. Patients over age 28 with a history of coronary or vascular disease and people who smoke have the greatest risk for mesenteric ischemia. SYMPTOMS  CMI usually feels like a severe stomachache. Some patients with CMI become fearful of eating due to pain. CMI typically causes:  Abdominal pain or cramps about 30 minutes after a meal.  Abdominal pain after eating that becomes worse over time.  Diarrhea.  Nausea.  Vomiting.  Bloating. DIAGNOSIS  CMI is often diagnosed through the patient's history, physical exam, and X-ray  tests such as CT scans or angiography. Angiography is an imaging test that uses a dye to obtain a picture of blood flow to the intestine. TREATMENT  Treatment of CMI may include:  Medicines to reduce blood clotting and increase blood flow.  Angioplasty. This is surgery to widen the affected artery, reduce blockage, and sometimes insert a small, mesh tube (stent).  Surgery to remove the blockage, repair arteries or veins, and restore blood flow. In addition:  A bypass surgery may be performed to bypass the blockage and reconnect healthy arteries or veins.  A stent may be inserted in the affected area to help keep blocked arteries open. PREVENTION  Certain lifestyle factors can help to prevent CMI. These include:  Regular exercise.  Healthy weight.  Healthy diet.  Managing cholesterol levels.  Keeping blood pressure and heart rhythm problems under control.  Not smoking. HOME CARE INSTRUCTIONS  Only take over-the-counter or prescription medicines as directed by your caregiver.  Keep all follow-up appointments as directed by your caregiver. SEEK IMMEDIATE MEDICAL CARE IF:  You have severe abdominal pain.  You notice blood in your stool.  You develop nausea, vomiting, or diarrhea.  You have a fever. MAKE SURE YOU:  Understand these instructions.  Will watch your condition.  Will get help right away if you are not doing well or get worse. Document Released: 09/07/2010 Document Revised: 04/12/2011 Document Reviewed: 09/07/2010 [facility] Patient Information 2013 [place].

## 2012-05-06 ENCOUNTER — Other Ambulatory Visit: Payer: Self-pay | Admitting: Internal Medicine

## 2012-05-06 NOTE — Progress Notes (Signed)
Subjective:    Patient ID: Ryan [name], male    DOB: 01/09/1932, 79 y.o.   MRN: [id number]  HPI Ryan [name] presents for evaluation of intermittent episodes of severe post-prandial abdominal pain. He will have an episode every several weeks of severe, 9/10, mid to lower abdominal pain that can last for more than an hour, is accompanied by bloating and gas. He denies RUQ pain or radiation of pain to the scapular region. His stools have been normal in color w/o steatorrheic odor. He has had no fever, rigors, vomiting, hematemesis or hematochezia.There is no association of bout of pain with high fat content meals. He does report that he has had fewer episodes since taking a digestive enzyme or Beano.  He is concerned that his lymphoma may have been present on CT abd/pelvis in June '07. Reviewed reports and images with him: radiology report w/o comment as to low pelvic mass and no mass was evident to my eyes when reviewing the images.   Past Medical History  Diagnosis Date  . Hyperlipemia   . Hypertension   . Benign prostatic hypertrophy   . Chronic fatigue   . Lactose intolerance   . Sleep disorder   . Personal history of colonic polyps 2004    hyperplastic Dr. Collene Mares  . Gluten intolerance   . Barrett's esophagus   . Esophageal reflux   . Diverticulosis of colon (without mention of hemorrhage)   . Periaortic lymphadenopathy 02/16/2011  . Atelectasis, bilateral 02/10/11    Noted On CT Scan - Mild Dependent Atelectasis at the Lung Bases  . Hiatal hernia 02/10/11    Noted on CT Scan - Moderate Hiatal Hernia  . Renal cyst 02/10/11     Noted on CT Scan - Bilateral Renal Cysts  . Osteoarthritis     hands/feet,knees,  . S/P radiation therapy 03/15/11 - 04/09/11    Abdominal/ Pelvic Tumor, 3600 cGy/20 Fractions  . Microcytic anemia 04/20/2011   . Abdominal pain     Bloating and gas  . Gastritis   . Internal hemorrhoids   . Melanoma     lymphoma  . 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 Rotator cuff repair      right  . Hernia repair    . Cataract extraction, bilateral    . Tonsillectomy    . Arthroscopic repair acl      right  . Bone marrow aspiration  02/25/11    Bone Marrow, Aspirate, Clot, and Bilateral Bx, Right PIC   Family History  Problem Relation Age of Onset  . Heart disease Father     MI 43  . Prostate cancer Brother   . Prostate cancer Paternal Uncle   . Prostate cancer Paternal Uncle   . Prostate cancer Paternal Uncle   . Colon cancer Neg Hx   . Hyperlipidemia    . Stroke    . Hypertension    . ADD / ADHD     History   Social History  . Marital Status: Married    Spouse Name: N/A    Number of Children: 2  . Years of Education: N/A   Occupational History  . 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 Smoking status: Former Smoker --  35 years    Types: Pipe    Quit date: 02/23/1992  . Smokeless tobacco: Never Used     Comment: quit 20 years ago  . Alcohol Use: Yes     Comment: 2 drinks daily scotch   . Drug Use: No  . Sexually Active: Not on file   Other Topics Concern  . Not on file   Social History Narrative   Married - second marriage   He has two children   Retired Horticulturist, commercial Professor   Currently teaches pottery making, 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[facility]   Former Smoker quit 12 years ago- smoked for 35 years   Alcohol use- yes    Current Outpatient Prescriptions on File Prior to Visit  Medication Sig Dispense Refill  . ALPRAZolam (XANAX) 0.5 MG tablet take 1 tablet by mouth at bedtime  30 tablet  5  . amLODipine (NORVASC) 5 MG tablet Take 1 tablet (5 mg total) by mouth daily.  30 tablet  3  . aspirin 81 MG tablet Take 81 mg by mouth daily.        Marland Kitchen b complex vitamins capsule Take 1 capsule by mouth daily.       . benazepril-hydrochlorthiazide (LOTENSIN HCT) 20-12.5 MG per tablet take 1 tablet by mouth once daily  30 tablet  11  . Biotin 1000 MCG tablet Take 1,000 mcg by mouth daily.      . Bismuth Subsalicylate (PEPTO-BISMOL PO) Take by mouth as needed.      . calcium elemental as carbonate (TUMS ULTRA 1000) 400 MG tablet Chew 1,000 mg by mouth at bedtime as needed.        . diclofenac (VOLTAREN) 75 MG EC tablet Take 75 mg by mouth daily.       Marland Kitchen EPINEPHrine (EPIPEN JR) 0.15 MG/0.3ML injection Inject 0.15 mg into the muscle as needed. For allergic reaction      . finasteride (PROSCAR) 5 MG tablet Take 1 tablet (5 mg total) by mouth daily.  30 tablet  11  . fluticasone (FLONASE) 50 MCG/ACT nasal spray Place 2 sprays into the nose as needed. For allergies       . loratadine (CLARITIN) 10 MG tablet Take 10 mg by mouth daily as needed. For allergies      . omeprazole (PRILOSEC) 20 MG capsule take 1 capsule by mouth once daily  90 capsule  3  . Red Yeast Rice Extract (RED YEAST RICE PO) Take 1 capsule by mouth daily.        . triazolam (HALCION) 0.25 MG tablet Take 1 tablet (0.25 mg total) by mouth at bedtime as needed.  30 tablet  2   No current facility-administered medications on file prior to visit.     Review of Systems System review is negative for any constitutional, cardiac, pulmonary, GI or neuro symptoms or complaints other than as described in the HPI.     Objective:   Physical Exam Filed Vitals:   05/04/12 1435  BP: 146/90  Pulse: 73  Temp: 97.5 F (36.4 C)  Resp: 12   Wt Readings from Last 3 Encounters:  05/04/12 178 lb 12.8 oz (81.103 kg)  04/18/12 184 lb 1.6 oz (83.507 kg)  03/21/12 188 lb (85.276 kg)   gen'l- WNWD white man in no distress HEENT- C&S clear w/o icterus Cor- RRR Pulm - normal respirations Abd - BS+ x 4, no HSM, no palpable GB bulb, minimal tenderness to percussion over the RUQ, minimal tenderness to  deep palpation RUQ.       Assessment & Plan:   Post-prandial abdominal pain - concern is for possible cholelithiasis vs mesenteric ischemia vs IBS  Plan Abdominal U/S  High fiber diet.  If U/S is negative and symptoms continue will consider CT angio of the abdomen.

## 2012-05-08 NOTE — Telephone Encounter (Signed)
Pt prefers to take Alprazolam over the Halcion. Okay for this? Also he wanted me to relay the message that the radiologist confirmed 2007 scan lymphoma and it is being taken to the peer review board.

## 2012-05-08 NOTE — Telephone Encounter (Signed)
duplicate medications: alprazolam and halcion are of the same class and both should not be taken together.  Does he have a preference for either of these sleep meds.

## 2012-05-08 NOTE — Telephone Encounter (Signed)
Ok to refill the halcion.  Very interesting on the 2007 CT and lymphoma. He and I both looked at the images and I did not see a mass. But, I am not a radiologist.

## 2012-05-08 NOTE — Telephone Encounter (Signed)
Alprazolam called in to [facility] since this is what pt decided he would take. I let the pharmacy know Halcion will not be filled at this time since these meds are in the same class.

## 2012-05-11 ENCOUNTER — Ambulatory Visit
Admission: RE | Admit: 2012-05-11 | Discharge: 2012-05-11 | Disposition: A | Discharge status: Home / Self Care | Payer: Medicare Other | Source: Ambulatory Visit | Attending: Internal Medicine | Admitting: Internal Medicine

## 2012-05-11 ENCOUNTER — Telehealth: Payer: Self-pay | Admitting: Internal Medicine

## 2012-05-11 DIAGNOSIS — R109 Unspecified abdominal pain: Secondary | ICD-10-CM

## 2012-05-11 NOTE — Telephone Encounter (Signed)
If he needs attention while I am out either Dr. Ronnald Ramp or Dr. Alain Marion will do a good job for him.

## 2012-05-11 NOTE — Telephone Encounter (Signed)
Pt is requesting that you assign him to one dr while you are out. He does not want to have to deal with multiple drs.

## 2012-05-11 NOTE — Telephone Encounter (Signed)
Phone call to pt letting him know Dr Ronnald Ramp or Dr Alain Marion will do a good job for him while Dr Linda Hedges is gone. Pt is requesting a phone call from Dr Linda Hedges directly. He is very upset he was not told you will be gone and he states the information given to him made it seem as if he is in a critical state. Please call.

## 2012-05-11 NOTE — Telephone Encounter (Signed)
Called patient. U/S abdomen normal. Next step - per last ov is CT angio r/o mesenteric ischemia.  Reassured him that there will be very good coverage of all patient issues during my absence

## 2012-05-12 ENCOUNTER — Telehealth: Payer: Self-pay | Admitting: *Deleted

## 2012-05-12 NOTE — Telephone Encounter (Signed)
Received vm call from pt stating that he would like to consult with Dr Beryle Beams (his go to guy) regarding a problem.  He reports that his doctor thinks he might have an "abdominal_______aorta which he stated can be very dangerous & scared him to death but when he called back to discuss this was told his doctor was away for 6 weeks & no plans to do any test."  "He states this is the end of the line for this doctor & wants to know if there is a teaching hospital that Dr Beryle Beams would recommend where good people will take care of this."  "He states he is feeling alarmed & annoyed".  Reviewed chart notes, U/S report & recent CT & nothing seen about an abd aortic aneurysm but did see Dr. Linda Hedges note about mesenteric ischemia.  This information was given to the pt & I think this defrayed some of his anxiety over this situation.  Discussed with Dr Beryle Beams & informed pt that he needs to call Dr Linda Hedges office & talk with his RN or assistant & see if angio test has been scheduled & if there is a problem, there are doctors in [place] that can handle this problem & no need to go to a teaching hosp.

## 2012-05-13 ENCOUNTER — Encounter: Payer: Self-pay | Admitting: Oncology

## 2012-05-13 NOTE — Progress Notes (Signed)
Phone conversation with patient on 05/05/12 At time of recent visit on 04/18/12, I reviewed current and previous CT scans with him and his wife.  I looked at a study done in June, 2007.  I thought there was increased soft tissue density in the area that showed bulky lymphadenopathy on scan done at time of diagnosis of lymphoma in January, 2013.  I reviewed the films with Dr Dolores Patty, Radiologist, who agreed with my impressions. She will refer the case to Radiology peer review. I reported this to the patient. I explained the peer review process to him. If he wishes to take any additional action, it is his prerogative.

## 2012-05-15 ENCOUNTER — Telehealth: Payer: Self-pay | Admitting: Internal Medicine

## 2012-05-15 DIAGNOSIS — R109 Unspecified abdominal pain: Secondary | ICD-10-CM

## 2012-05-15 NOTE — Telephone Encounter (Signed)
Patient is calling to see what the next step is for him and what further testing needs to be done

## 2012-05-15 NOTE — Telephone Encounter (Signed)
Order placed for CT angio

## 2012-05-16 NOTE — Telephone Encounter (Signed)
Phone call to pt and left a message letting him know an order was placed for a CT angio gram and he will be getting a call from a scheduler. I stated if he has questions he can call back.

## 2012-05-17 ENCOUNTER — Encounter: Payer: Self-pay | Admitting: Nurse Practitioner

## 2012-05-17 ENCOUNTER — Ambulatory Visit (INDEPENDENT_AMBULATORY_CARE_PROVIDER_SITE_OTHER): Payer: Medicare Other | Admitting: Nurse Practitioner

## 2012-05-17 ENCOUNTER — Telehealth: Payer: Self-pay | Admitting: Gastroenterology

## 2012-05-17 VITALS — BP 132/82 | HR 86 | Ht 67.5 in | Wt 188.4 lb

## 2012-05-17 DIAGNOSIS — R109 Unspecified abdominal pain: Secondary | ICD-10-CM

## 2012-05-17 DIAGNOSIS — K439 Ventral hernia without obstruction or gangrene: Secondary | ICD-10-CM

## 2012-05-17 NOTE — Telephone Encounter (Signed)
Pt c/o post prandial pain and cramping if he does not take enzymes, Beano, after eating. He reports the pain is above his waist and in the middle. He reports Nicoletta Ba, PA informed him he has a hernia and when I clarified HH, he stated no. He had an abdominal U/S on 05/11/12 and will repeat a CT on 05/23/12.  Pt was last seen by Dr Sharlett Iles 09/2011 and his last COLON was 06/17/10 with severe diverticulosis and EGD with Barrett's.  Hx of NHL and is followed by Dr Beryle Beams. Pt given an appt with Tye Savoy, NP today.

## 2012-05-17 NOTE — Progress Notes (Signed)
  05/18/2012 Ryan [name] [id number] [birth year]   History of Present Illness:   Patient is a 79 year-old male known to Dr. Sharlett Iles. He has a history of diverticular disease, Barrett's esophagus,  iron deficiency anemia, chronic B12 deficiency, gluten sensitivity and GERD. In January 2013 patient was seen by [place] for postprandial upper abdominal pain. On exam he appeared to have a ventral hernia the area where patient complained of pain. CT scan was ordered and did show a fat containing ventral hernia. Surprisingly, he also had a retroperitoneal mass suspicious for low-grade lymphoma. 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 Beryle Beams.  Patient returned to [place] in May of last year for evaluation of abdominal cramping, different than his original abdominal discomfort. Pain was crampy, located in the mid abdomen and associated with bloating. 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 Patient was treated with probiotics and bowel antispasmodics and his symptoms improved. He does require Beano before every meal. If he consumes more than a modest of amount of food abdominal cramping will occur despite taking Beano.   Patient comes in today for mid upper abdominal pain which began last night around 7pm. This pain is different than his chronic abdominal pain described above. Patient has been participating in physical therapy for his back. He has been doing some abdominal exercises. Pain is not associated with nausea. He has been on diclofenac for years   Current Medications, Allergies, Past Medical History, Past Surgical History, Family History and Social History were reviewed in [employer] record.   Physical Exam: General: Well developed , white male in no acute distress Head: Normocephalic and atraumatic Eyes:  sclerae anicteric, conjunctiva pink  Ears: Normal auditory acuity Lungs: Clear  throughout to auscultation Heart: Regular rate and rhythm Abdomen: Soft, non distended. Soft, ventral hernia above umbilicus which is moderately tender to palpation. No masses, no hepatomegaly. Normal bowel sounds Musculoskeletal: Symmetrical with no gross deformities  Extremities: No edema  Neurological: Alert oriented x 4, grossly nonfocal Psychological:  Alert and cooperative. Normal mood and affect  Assessment and Recommendations:  46. 79 year old male with upper abdominal pain of less than 24 hours duration. He is tender over ventral hernia. Patient has been participating in physical therapy and working some of his abdominal muscles. Suspect this is musculoskeletal pain but cannot exclude pain related to the hernia. Of note, patient just had his follow up CTscan for lymphoma 04/07/12. The hernia at that time was stable / fat containing. Long discussion with patient and wife. He will avoid abdominal exercises for the next several days to see if this helps. If not then he should have surgical evaluation for the abdominal hernia. Patient will call in a few days with a condition update. He knows to call ASAP if pain worsens and / or develops nausea or vomiting.    2. History of low grade B cell non-Hodgkins lymphoma, s/p radiation with great response. CT scan 04/07/12 shows stable / unchanged residual soft tissue density involving the distal inferior vena cava and right iliac vein  3. Barrett's esophagus. On PPI at home. Surveillance exams done by Dr. Sharlett Iles.

## 2012-05-19 DIAGNOSIS — R109 Unspecified abdominal pain: Secondary | ICD-10-CM | POA: Insufficient documentation

## 2012-05-23 ENCOUNTER — Ambulatory Visit (INDEPENDENT_AMBULATORY_CARE_PROVIDER_SITE_OTHER)
Admission: RE | Admit: 2012-05-23 | Discharge: 2012-05-23 | Disposition: A | Discharge status: Home / Self Care | Payer: Medicare Other | Source: Ambulatory Visit | Attending: Internal Medicine | Admitting: Internal Medicine

## 2012-05-23 DIAGNOSIS — R109 Unspecified abdominal pain: Secondary | ICD-10-CM

## 2012-05-23 MED ORDER — IOHEXOL 350 MG/ML SOLN
100.0000 mL | Freq: Once | INTRAVENOUS | Status: AC | PRN
  Administered 2012-05-23: 100 mL via INTRAVENOUS

## 2012-05-26 ENCOUNTER — Telehealth: Payer: Self-pay | Admitting: Nurse Practitioner

## 2012-05-26 ENCOUNTER — Telehealth: Payer: Self-pay | Admitting: Internal Medicine

## 2012-05-26 ENCOUNTER — Telehealth: Payer: Self-pay | Admitting: Gastroenterology

## 2012-05-26 NOTE — Telephone Encounter (Signed)
Patient is requesting call back with results of his CT angio

## 2012-05-26 NOTE — Telephone Encounter (Signed)
Ryan [name], pt wants to see you rather than Dr Sharlett Iles. Please inform on who to schedule with. Thanks.

## 2012-05-26 NOTE — Telephone Encounter (Signed)
Only signficant finding was a small abd wall hernia, that may be causing the pain  If still having pain, we should refer to Gen Surgury  Please let me know of pt decision

## 2012-05-26 NOTE — Telephone Encounter (Signed)
Pt notified and will call back on what he wants to do.

## 2012-05-26 NOTE — Telephone Encounter (Signed)
Spoke with patient's wife and explained the MD that did the CT needs to review it and give results.

## 2012-05-29 ENCOUNTER — Ambulatory Visit (INDEPENDENT_AMBULATORY_CARE_PROVIDER_SITE_OTHER): Payer: Medicare Other | Admitting: Internal Medicine

## 2012-05-29 ENCOUNTER — Encounter: Payer: Self-pay | Admitting: Internal Medicine

## 2012-05-29 VITALS — BP 144/82 | HR 76 | Temp 97.3°F | Wt 189.8 lb

## 2012-05-29 DIAGNOSIS — K439 Ventral hernia without obstruction or gangrene: Secondary | ICD-10-CM

## 2012-05-29 NOTE — Patient Instructions (Signed)
Hernia A hernia occurs when an internal organ pushes out through a weak spot in the abdominal wall. Hernias most commonly occur in the groin and around the navel. Hernias often can be pushed back into place (reduced). Most hernias tend to get worse over time. Some abdominal hernias can get stuck in the opening (irreducible or incarcerated hernia) and cannot be reduced. An irreducible abdominal hernia which is tightly squeezed into the opening is at risk for impaired blood supply (strangulated hernia). A strangulated hernia is a medical emergency. Because of the risk for an irreducible or strangulated hernia, surgery may be recommended to repair a hernia. CAUSES   Heavy lifting.  Prolonged coughing.  Straining to have a bowel movement.  A cut (incision) made during an abdominal surgery. HOME CARE INSTRUCTIONS   Bed rest is not required. You may continue your normal activities.  Avoid lifting more than 10 pounds (4.5 kg) or straining.  Cough gently. If you are a smoker it is best to stop. Even the best hernia repair can break down with the continual strain of coughing. Even if you do not have your hernia repaired, a cough will continue to aggravate the problem.  Do not wear anything tight over your hernia. Do not try to keep it in with an outside bandage or truss. These can damage abdominal contents if they are trapped within the hernia sac.  Eat a normal diet.  Avoid constipation. Straining over long periods of time will increase hernia size and encourage breakdown of repairs. If you cannot do this with diet alone, stool softeners may be used. SEEK IMMEDIATE MEDICAL CARE IF:   You have a fever.  You develop increasing abdominal pain.  You feel nauseous or vomit.  Your hernia is stuck outside the abdomen, looks discolored, feels hard, or is tender.  You have any changes in your bowel habits or in the hernia that are unusual for you.  You have increased pain or swelling around the  hernia.  You cannot push the hernia back in place by applying gentle pressure while lying down. MAKE SURE YOU:   Understand these instructions.  Will watch your condition.  Will get help right away if you are not doing well or get worse. Document Released: 01/18/2005 Document Revised: 04/12/2011 Document Reviewed: 09/07/2007 [facility] Patient Information 2013 [place].

## 2012-05-29 NOTE — Progress Notes (Signed)
Subjective:    Patient ID: Ryan [name], male    DOB: 22-Jan-1932, 79 y.o.   MRN: [id number]  HPI  Pt presents to the clinic today with c/o pain in his lower abdomen. This has been an ongoing issue. He has recently had a CT angiogram. The CT did show an abdominal wall hernia, that is thought to be the source of his pain. He is not currently in pain but his orthopedic doctor will not proceed with the back surgery that he needs until the abdominal issue gets sorted out. He is requesting referral to general surgery for evaluation of the abdominal wall hernia.  Review of Systems      Past Medical History  Diagnosis Date  . Hyperlipemia   . Hypertension   . Benign prostatic hypertrophy   . Chronic fatigue   . Lactose intolerance   . Sleep disorder   . Personal history of colonic polyps 2004    hyperplastic Dr. Collene Mares  . Gluten intolerance   . Barrett's esophagus   . Esophageal reflux   . Diverticulosis of colon (without mention of hemorrhage)   . Periaortic lymphadenopathy 02/16/2011  . Atelectasis, bilateral 02/10/11    Noted On CT Scan - Mild Dependent Atelectasis at the Lung Bases  . Hiatal hernia 02/10/11    Noted on CT Scan - Moderate Hiatal Hernia  . Renal cyst 02/10/11     Noted on CT Scan - Bilateral Renal Cysts  . Osteoarthritis     hands/feet,knees,  . S/P radiation therapy 03/15/11 - 04/09/11    Abdominal/ Pelvic Tumor, 3600 cGy/20 Fractions  . Microcytic anemia 04/20/2011   . Abdominal pain     Bloating and gas  . Gastritis   . Internal hemorrhoids   . Melanoma     lymphoma  . 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 ALPRAZolam (XANAX) 0.5 MG tablet take 1 tablet by mouth at bedtime  30 tablet  5  . amLODipine (NORVASC) 5 MG tablet Take 1 tablet (5 mg total) by mouth  daily.  30 tablet  3  . aspirin 81 MG tablet Take 81 mg by mouth daily.        Marland Kitchen b complex vitamins capsule Take 1 capsule by mouth daily.      . benazepril-hydrochlorthiazide (LOTENSIN HCT) 20-12.5 MG per tablet take 1 tablet by mouth once daily  30 tablet  11  . Biotin 1000 MCG tablet Take 1,000 mcg by mouth daily.      . Bismuth Subsalicylate (PEPTO-BISMOL PO) Take by mouth as needed.      . calcium elemental as carbonate (TUMS ULTRA 1000) 400 MG tablet Chew 1,000 mg by mouth at bedtime as needed.        . diclofenac (VOLTAREN) 75 MG EC tablet Take 75 mg by mouth daily.       Marland Kitchen EPINEPHrine (EPIPEN JR) 0.15 MG/0.3ML injection Inject 0.15 mg into the muscle as needed. For allergic reaction      . finasteride (PROSCAR) 5 MG tablet Take 1 tablet (5 mg total) by mouth daily.  30 tablet  11  . fluticasone (FLONASE) 50 MCG/ACT nasal spray Place 2 sprays into the nose as needed. For allergies       . loratadine (CLARITIN) 10 MG tablet  Take 10 mg by mouth daily as needed. For allergies      . omeprazole (PRILOSEC) 20 MG capsule take 1 capsule by mouth once daily  90 capsule  3  . Red Yeast Rice Extract (RED YEAST RICE PO) Take 1 capsule by mouth daily.         No current facility-administered medications for this visit.    Allergies  Allergen Reactions  . Molds & Smuts Other (See Comments)    Also dust mites causes sinus infections, h/a etc.  . Pollen Extract-Tree Extract Other (See Comments)    HEADACHES, TIRED , DRAINAGE FROM SINUSES    Family History  Problem Relation Age of Onset  . Heart disease Father     MI 69  . Prostate cancer Brother   . Prostate cancer Paternal Uncle   . Prostate cancer Paternal Uncle   . Prostate cancer Paternal Uncle   . Colon cancer Neg Hx   . Hyperlipidemia    . Stroke    . Hypertension    . ADD / ADHD      History   Social History  . Marital Status: Married    Spouse Name: N/A    Number of Children: 2  . Years of Education: N/A    Occupational History  . 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 Smoking status: Former Smoker -- 35 years    Types: Pipe    Quit date: 02/23/1992  . Smokeless tobacco: Never Used     Comment: quit 20 years ago  . Alcohol Use: Yes     Comment: 2 drinks daily scotch   . Drug Use: No  . Sexually Active: Not on file   Other Topics Concern  . Not on file   Social History Narrative   Married - second marriage   He has two children   Retired Horticulturist, commercial Professor   Currently teaches pottery making, 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[facility]   Former Smoker quit 12 years ago- smoked for 35 years   Alcohol use- yes     Constitutional: Denies fever, malaise, fatigue, headache or abrupt weight changes.   Cardiovascular: Denies chest pain, chest tightness, palpitations or swelling in the hands or feet.  Gastrointestinal: Pt reports intermittent lower abdominal pain and constipation. Denies , bloating, diarrhea or blood in the stool.  .   No other specific complaints in a complete review of systems (except as listed in HPI above).  Objective:   Physical Exam   BP 144/82  Pulse 76  Temp(Src) 97.3 F (36.3 C) (Oral)  Wt 189 lb 12.8 oz (86.093 kg)  BMI 29.27 kg/m2  SpO2 95% Wt Readings from Last 3 Encounters:  05/29/12 189 lb 12.8 oz (86.093 kg)  05/17/12 188 lb 6.4 oz (85.458 kg)  05/04/12 178 lb 12.8 oz (81.103 kg)    General: Appears his stated age, well developed, well nourished in NAD. Cardiovascular: Normal rate and rhythm. S1,S2 noted.  No murmur, rubs or gallops noted. No JVD or BLE edema. No carotid bruits noted. Pulmonary/Chest: Normal effort and positive vesicular breath sounds. No respiratory distress. No wheezes, rales or ronchi noted.  Abdomen: Soft and nontender. Normal bowel sounds, no bruits noted. Large non reducible abdominal wall hernia noted. Liver, spleen and kidneys non palpable.       Assessment & Plan:   Abdominal  wall hernia:  Will refer to general surgery per pt request Pt  given information regarding sign and symptoms of incarcerated hernia and when to see immediate attention

## 2012-05-30 NOTE — Telephone Encounter (Signed)
Nevin Bloodgood left me a message yesterday stating she would be glad to see pt, but ultimately he will have to f/u with Dr Sharlett Iles. Pt saw NP in primary care about a surgical referral. Called pt this am to inform him Nevin Bloodgood will see him next week when she is in the ofc if he still wishes. Pt states he would like to talk to her again. Explained to pt I will call him later when I have Paula's schedule; pt stated understanding.

## 2012-06-07 ENCOUNTER — Encounter (INDEPENDENT_AMBULATORY_CARE_PROVIDER_SITE_OTHER): Payer: Self-pay | Admitting: General Surgery

## 2012-06-07 ENCOUNTER — Other Ambulatory Visit: Payer: Self-pay

## 2012-06-07 ENCOUNTER — Ambulatory Visit (INDEPENDENT_AMBULATORY_CARE_PROVIDER_SITE_OTHER): Payer: Medicare Other | Admitting: General Surgery

## 2012-06-07 VITALS — BP 140/82 | HR 75 | Temp 97.5°F | Resp 18 | Ht 67.5 in | Wt 184.0 lb

## 2012-06-07 DIAGNOSIS — K449 Diaphragmatic hernia without obstruction or gangrene: Secondary | ICD-10-CM

## 2012-06-07 DIAGNOSIS — K439 Ventral hernia without obstruction or gangrene: Secondary | ICD-10-CM

## 2012-06-07 DIAGNOSIS — K2270 Barrett's esophagus without dysplasia: Secondary | ICD-10-CM

## 2012-06-07 MED ORDER — DICLOFENAC SODIUM 75 MG PO TBEC: 75.0000 mg | DELAYED_RELEASE_TABLET | Freq: Every day | ORAL | Status: DC

## 2012-06-07 NOTE — Progress Notes (Signed)
Patient ID: Ryan [name], male   DOB: 1932/01/05, 79 y.o.   MRN: [id number]  Chief Complaint  Patient presents with  . New Evaluation    eval abd hernia    HPI Ryan [name] is a 79 y.o. male.  This patient was referred by Dr. Crissie Sickles for evaluation of a ventral hernia. He has been seen previously in into doesn't have for evaluation of this but at that time it was felt that he only had a diastases and no surgery was recommended. He has a history of several years of abdominal pain which she describes as severe cramps across his entire abdomen which she rates as a 9/10 in severity. He says that he first started having these in about 2005 and he had tried several dietary modifications for improvement of his symptoms and he had partial relief with a gluten-free diet but is cramps returned. He then tried a lactose-free diet which helped as well but he still continues to have abdominal cramps which she says mainly occur about one hour after eating large meals and last for 3-4 hours. He says as long as he eats small meals and takes Beano he does not have significant discomfort. His history is further complicated by a history of abdominal lymphoma and retroperitoneal lymphoma of diagnosed in 2013 which was treated with radiation therapy and he has had a good response. He is followed by Dr. Beryle Beams and the patient tells me that his doctor is not concerned about the lymphoma.  About 3 weeks ago he said that he felt a "pop" in the area of his mid abdomen with some increasing pain and tenderness to palpation this has subsequently resolved. As a workup of this he recently underwent a CT angiogram to evaluate for possible mesenteric ischemia or angina and this showed a patent vessels without any evidence of arterial disease. On the CT scan it showed a hernia in his upper midabdomen containing fat we also had some fluid up in the hernia sac. He is followed by a gastroenterologist for Barrett's esophagus and his  last upper endoscopy was about 18-20 months ago but he has another followup visit tomorrow with his GI doctor. He does have some occasional nausea but only one episode of vomiting.    He does have a history of reflux and takes Prilosec for treatment of this as well as for treatment of his Barrett's esophagus. He says that he has never been diagnosed with an ulcer but he does take aspirin daily as well as diclofenac 4 chronic back pain. HPI  Past Medical History  Diagnosis Date  . Hyperlipemia   . Hypertension   . Benign prostatic hypertrophy   . Chronic fatigue   . Lactose intolerance   . Sleep disorder   . Personal history of colonic polyps 2004    hyperplastic Dr. Collene Mares  . Gluten intolerance   . Barrett's esophagus   . Esophageal reflux   . Diverticulosis of colon (without mention of hemorrhage)   . Periaortic lymphadenopathy 02/16/2011  . Atelectasis, bilateral 02/10/11    Noted On CT Scan - Mild Dependent Atelectasis at the Lung Bases  . Hiatal hernia 02/10/11    Noted on CT Scan - Moderate Hiatal Hernia  . Renal cyst 02/10/11     Noted on CT Scan - Bilateral Renal Cysts  . Osteoarthritis     hands/feet,knees,  . S/P radiation therapy 03/15/11 - 04/09/11    Abdominal/ Pelvic Tumor, 3600 cGy/20 Fractions  . Microcytic  anemia 04/20/2011   . Abdominal pain     Bloating and gas  . Gastritis   . Internal hemorrhoids   . Melanoma     lymphoma  . 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 Rotator cuff repair      right  . Hernia repair    . Cataract extraction, bilateral    . Tonsillectomy    . Arthroscopic repair acl      right  . Bone marrow aspiration  02/25/11    Bone Marrow, Aspirate, Clot, and Bilateral Bx, Right PIC    Family History  Problem Relation Age of Onset  . Heart disease Father      MI 28  . Prostate cancer Brother   . Prostate cancer Paternal Uncle   . Prostate cancer Paternal Uncle   . Prostate cancer Paternal Uncle   . Colon cancer Neg Hx   . Hyperlipidemia    . Stroke    . Hypertension    . ADD / ADHD      Social History History  Substance Use Topics  . Smoking status: Former Smoker -- 35 years    Types: Pipe    Quit date: 02/23/1992  . Smokeless tobacco: Never Used     Comment: quit 20 years ago  . Alcohol Use: Yes     Comment: 2 drinks daily scotch     Allergies  Allergen Reactions  . Molds & Smuts Other (See Comments)    Also dust mites causes sinus infections, h/a etc.  . Pollen Extract-Tree Extract Other (See Comments)    HEADACHES, TIRED , DRAINAGE FROM SINUSES    Current Outpatient Prescriptions  Medication Sig Dispense Refill  . ALPRAZolam (XANAX) 0.5 MG tablet take 1 tablet by mouth at bedtime  30 tablet  5  . amLODipine (NORVASC) 5 MG tablet Take 1 tablet (5 mg total) by mouth daily.  30 tablet  3  . b complex vitamins capsule Take 1 capsule by mouth daily.      . benazepril-hydrochlorthiazide (LOTENSIN HCT) 20-12.5 MG per tablet take 1 tablet by mouth once daily  30 tablet  11  . Biotin 1000 MCG tablet Take 1,000 mcg by mouth daily.      . diclofenac (VOLTAREN) 75 MG EC tablet Take 75 mg by mouth daily.       Marland Kitchen EPINEPHrine (EPIPEN JR) 0.15 MG/0.3ML injection Inject 0.15 mg into the muscle as needed. For allergic reaction      . finasteride (PROSCAR) 5 MG tablet Take 1 tablet (5 mg total) by mouth daily.  30 tablet  11  . fluticasone (FLONASE) 50 MCG/ACT nasal spray Place 2 sprays into the nose as needed. For allergies       . HYDROcodone-acetaminophen (NORCO/VICODIN) 5-325 MG per tablet Take 1 tablet by mouth every 6 (six) hours as needed for pain.      Marland Kitchen loratadine (CLARITIN) 10 MG tablet Take 10 mg by mouth daily as needed. For allergies      . omeprazole (PRILOSEC) 20 MG capsule take 1 capsule by mouth once daily  90 capsule  3   . Red Yeast Rice Extract (RED YEAST RICE PO) Take 1 capsule by mouth daily.         No current facility-administered medications for  this visit.    Review of Systems Review of Systems All other review of systems negative or noncontributory except as stated in the HPI  Blood pressure 140/82, pulse 75, temperature 97.5 F (36.4 C), temperature source Temporal, resp. rate 18, height 5' 7.5" (1.715 m), weight 184 lb (83.462 kg).  Physical Exam Physical Exam Physical Exam  Vitals reviewed. Constitutional: He is oriented to person, place, and time. He appears well-developed and well-nourished. No distress.  HENT:  Head: Normocephalic and atraumatic.  Mouth/Throat: No oropharyngeal exudate.  Eyes: Conjunctivae and EOM are normal. Pupils are equal, round, and reactive to light. Right eye exhibits no discharge. Left eye exhibits no discharge. No scleral icterus.  Neck: Normal range of motion. No tracheal deviation present.  Cardiovascular: Normal rate, regular rhythm and normal heart sounds.   Pulmonary/Chest: Effort normal and breath sounds normal. No stridor. No respiratory distress. He has no wheezes. He has no rales. He exhibits no tenderness.  Abdominal: Soft. Bowel sounds are normal. He exhibits no distension and no mass. There is no tenderness. There is no rebound and no guarding.   He does have a mild midline diastasesWith a reducible ventral hernia in the upper midline. I was able to reduce the bulge and I cannot palpate the fascial defects which felt about 4 cm in diameter. He did have some mild tenderness after the hernia was reduced Musculoskeletal: Normal range of motion. He exhibits no edema and no tenderness.  Neurological: He is alert and oriented to person, place, and time.  Skin: Skin is warm and dry. No rash noted. He is not diaphoretic. No erythema. No pallor.  Psychiatric: He has a normal mood and affect. His behavior is normal. Judgment and thought content normal.     Data Reviewed Physician notes, CT   Assessment    Ventral hernia-reducible Hiatal hernia Barrett's esophagus We had a very long discussion regarding his symptoms and the possibilities of treatment of his ventral and hiatal hernia. He is fairly complicated because of his history and he does not have a clear-cut cause of his pain. He has several GI issues and has had partial relief with dietary changes and but does continue to have some abdominal tenderness which may or not be related to his ventral hernia or his hiatal hernia. I discussed the options of repair of either both of his hernias versus repair of ventral hernia and observation as had a hernia of or repair of his hiatal hernia with observation of his ventral hernia.  We discussed the pros and cons of these options and this is complicated because is not clear if either one of these is causing symptoms or both.  If we did repair he is had a hernia bed we could plan for Nissen fundoplication for possible treatment of his Barrett's esophagus simultaneously. Again, I counseled him several times that even though we repair either a ventral hernia or has had a hernia, there would be no guarantee that this would relieve his symptoms. He expressed understanding of this. This does not appear to be recurrent lymphoma of although this is in the differential as well. Peptic ulcer disease could also be in the differential of although he is currently on PPIs daily. It sounds as though his main complaint right now is his back and he would like to have this repaired I think prior to any other abdominal procedures and so he is going to discuss this with his orthopedic surgeon as well. We will discuss his case  with his GI doctor and he follows up with him tomorrow and he will discuss this with his gastroenterologist as well. If he would like to have either ventral or hiatal hernia repair and we be happy to offer this for him     Plan    followup with  gastroenterology and orthopedic surgery and if he would like to undergo either ventral hernia repair for hiatal hernia repair and we be happy to offer this to him.  He will let me know what he would like to do.        [place], [place] 06/07/2012, 12:12 PM

## 2012-06-08 ENCOUNTER — Ambulatory Visit (INDEPENDENT_AMBULATORY_CARE_PROVIDER_SITE_OTHER): Payer: Medicare Other | Admitting: Nurse Practitioner

## 2012-06-08 ENCOUNTER — Encounter: Payer: Self-pay | Admitting: Nurse Practitioner

## 2012-06-08 VITALS — BP 132/78 | HR 68 | Ht 66.0 in | Wt 183.0 lb

## 2012-06-08 DIAGNOSIS — K439 Ventral hernia without obstruction or gangrene: Secondary | ICD-10-CM

## 2012-06-08 DIAGNOSIS — K2270 Barrett's esophagus without dysplasia: Secondary | ICD-10-CM

## 2012-06-08 NOTE — Progress Notes (Signed)
History of Present Illness:   Patient is a 79 year-old male known to Dr. Sharlett Iles. He has a history of diverticular disease, Barrett's esophagus, iron deficiency anemia, chronic B12 deficiency, gluten sensitivity and GERD. In January 2013 patient was seen by [place] for postprandial upper abdominal pain. On exam he appeared to have a ventral hernia the area where patient complained of pain. CT scan was ordered and did show a fat containing ventral hernia. Surprisingly, he also had a retroperitoneal mass suspicious for low-grade lymphoma. 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 Beryle Beams.   Patient returned to [place] in May of last year for evaluation of abdominal cramping, different than his original abdominal discomfort. Pain was crampy, located in the mid abdomen and associated with bloating. 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 Patient was treated with probiotics and bowel antispasmodics and his symptoms improved. He does require Beano before every meal. If he consumes more than a modest of amount of food, abdominal cramping will occur despite taking Beano.   Patient was last seen 05/17/12 when he presented with mid upper abdominal pain which began the night prior. This pain was different  from his chronic abdominal pain described above. Patient had been participating in physical therapy for his back. He had been doing some abdominal exercises. Pain was felt to be musculoskeletal in nature though it was located in area of known hernia. Patient was advised to hold off on exercises for a couple of weeks and call us with condition update. If pain did not resolve patient would be sent for surgical evaluation. An MRA of abdomen had already been ordered by his PCP. Following our office visit the MRA of the abdomen was done and negative for mesenteric venous abnormalities /ischemia. the small abdominal wall hernia unchanged. PCP  referred patient to surgeon(Dr. Lilyan Punt) yesterday for evaluation of hernia and abdominal pain. Surgeon wasn't confident that hernia was source of pain but repair was discussed. Repair of hiatal hernia at time of ventral hernia was also discussed. Patient has Barrett's esophagus and Nissen fundoplication at time of surgery was also an option.   Patient hasn't had any pain in area of hernia since he discontinued abdominal exercises upon our recommendations. He has reduced the hernia himself and Dr.Layton reduced it yesterday. While patient is happy with improvement in pain he feels somewhat restricted with what he can do physically without causing the hernia to protrude.    Current Medications, Allergies, Past Medical History, Past Surgical History, Family History and Social History were reviewed in [employer] record.  Studies:   Ct Angio Abd/pel W/ And/or W/o  05/23/2012  *RADIOLOGY REPORT*  Clinical Data:  Intermittent postprandial pain  CT ANGIOGRAPHY ABDOMEN AND PELVIS  Technique:  Multidetector CT imaging of the abdomen and pelvis was performed using the standard protocol during bolus administration of intravenous contrast.  Multiplanar reconstructed images including MIPs were obtained and reviewed to evaluate the vascular anatomy. Delayed images for evaluation of the portal venous system were obtained as well.  Contrast: 172m OMNIPAQUE IOHEXOL 350 MG/ML SOLN  Comparison:  04/07/2012, 08/17/2011  Findings:  Lung bases are free of acute infiltrate.  Mild compressive atelectasis is noted in the left lung base related to a large hiatal hernia.  This is stable in appearance from prior exam. A new 8 mm nodular appearing density is noted in the posterior costophrenic angle on the right as seen on image number 11 of  series 6.  This is new from prior exams and may be related to inflammatory change.  Aorta:  No aneurysmal dilatation is seen.  Mild calcific changes are noted.  The iliac  vessels demonstrate no aneurysmal dilatation. No focal stenosis is seen.  Visceral vessels:  The celiac axis and superior mesenteric artery are widely patent.  The inferior mesenteric artery is widely patent as well.  No findings to suggest acute or chronic mesenteric ischemia are identified.  Single renal arteries are identified bilaterally with mild calcific changes.  No focal stenosis is identified.  Mesenteric venous structures:  The portal venous system as well as the hepatic veins are widely patent.   Review of the MIP images confirms the above findings.  The liver, spleen, gallbladder, adrenal glands and pancreas are all normal on the CT appearance.  Multiple cystic changes are noted throughout both kidneys but most predominately on the right. Calculi or obstructive changes are seen.  The appendix is within normal limits.  Scattered diverticular change of the colon is seen without diverticulitis.  The bladder is incompletely distended.  No pelvic mass lesion is seen. No aggressive osteoblastic or osteolytic lesion is noted.  There is a anterior abdominal wall hernia above the umbilicus which contains omental fat.  No bowel is noted within.  This may be the etiology of the patient's underlying intermittent discomfort.  It measures approximately 3.9 cm in greatest dimension.  IMPRESSION:  No evidence of mesenteric ischemia or mesenteric venous abnormality.  Small anterior abdominal wall hernia.  This may be the etiology of patient's intermittent pain.  Small right lower lobe nodule which is new from March 2014.  This is likely inflammatory in nature.  Large hiatal hernia is stable from prior studies.  Although not mentioned in the body of the report there remains some soft tissue density surrounding the aortic bifurcation and iliac vein confluence.  This is stable from previous exams.   Original Report Authenticated By: Inez Catalina, M.D.    Physical Exam: General: Well developed , white male in no acute  distress Head: Normocephalic and atraumatic Eyes:  sclerae anicteric, conjunctiva pink  Ears: Normal auditory acuity Lungs: Clear throughout to auscultation Heart: Regular rate and rhythm Abdomen: Soft, non distended, non-tender. No masses, no hepatomegaly. Normal bowel sounds. Ventral hernia not as obvious on today's exam (?recently reduced by surgeon) Musculoskeletal: Symmetrical with no gross deformities  Extremities: No edema  Neurological: Alert oriented x 4, grossly nonfocal Psychological:  Alert and cooperative. Normal mood and affect  Assessment and Recommendations:  1. Mid upper abdominal pain, currently resolved. Patient believes pain related to ventral hernia when physical activity / exercise causes it to protrude. Pain better when hernia reduced as it is at present. His workup for abdominal pain has been otherwise unremarkable. Patient has been evaluated by surgery, options discussed. While no one knows for certain that his pain is in fact related to the ventral hernia, it sounds like patient may be headed towards a repair by Dr. Lilyan Punt. See #2   2. Large hiatal hernia. MRA of abdomen (done to evaluate for mesenteric ischemia) reveals mild compressive atelectasis in the left lung base related to the large hiatal hernia. Hernia was 5cm on EGD in 22012. Surgery (Dr. Lilyan Punt) has given patient option of doing a hiatal hernia repair and Nissen fundoplication at time of his ventral hernia repair. I spoke with Dr. Sharlett Iles, patient's primary GI and he feels it is reasonable to proceed with both ventral hernia and  hiatal  hernia repair but agrees that surgery may not resolve all of the patient's GI symptoms. I will forward a copy of my note to Dr. Sharlett Iles for any additional input he may have.   3. GERD / Biopsy proven Barrett's. Patient on daily PPI.

## 2012-06-09 ENCOUNTER — Telehealth: Payer: Self-pay | Admitting: Internal Medicine

## 2012-06-09 ENCOUNTER — Telehealth: Payer: Self-pay

## 2012-06-09 ENCOUNTER — Telehealth (INDEPENDENT_AMBULATORY_CARE_PROVIDER_SITE_OTHER): Payer: Self-pay

## 2012-06-09 NOTE — Telephone Encounter (Signed)
Contacted by EMS paramedic who was called to the patient's home about 1:30 AM with abdominal pain. The paramedic did not fill the patient absolutely needed to be transported to the hospital. He was having pain associated with his ventral hernia, but the paramedic states this was able to be reduced without difficulty The patient was hemodynamically stable and preferred office visit if possible, rather than transport patient to the ER I advised that his abdominal pain worsened, he developed nausea or vomiting, fever or chills that he should go to the hospital. They voiced understanding  Will alert office staff to check on patient this morning

## 2012-06-09 NOTE — Telephone Encounter (Signed)
Pt called stating he his hernia protruded yesterday and he called his GI MD to determine how to reduce the hernia. Pt called EMS and they assisted with reducing hernia. Pt states Dr Lilyan Punt advised him how to reduce hernia if it did protrude and become painful. Pt was advised it is important to reduce hernia as soon as it protrudes. Pt advised if hernia becomes firm,painful and cannot be reduced he needs to go to ER. Pt states he does understand and will call back to schedule surgery once he has seen orthopedist.

## 2012-06-09 NOTE — Telephone Encounter (Signed)
[facility] Triage Call Report Triage Record Num: [id number] Operator: Prentice Docker [place]il Patient Name: Ryan [name] Call Date & Time: 06/09/2012 2:02:53AM Patient Phone: [phone] PCP: Adella Hare Patient Gender: Male PCP Fax : [phone] Patient DOB: 09-14-1931 Practice Name: [facility] Reason for Call: Caller: Wayna Chalet; PCP: Adella Hare (Adults only); CB#: [phone]; Call regarding Abdominal Pain; Abdominal pain onset 4 hrs ago 06/08/12. EMS has been there and now on pain meds. Did not take pt to ED. Caller states they are fine for now and will follow up with office at 0800. Protocol(s) Used: Office Note Recommended Outcome per Protocol: Information Noted and Sent to Office Reason for Outcome: Caller information to office

## 2012-06-09 NOTE — Telephone Encounter (Signed)
I called and spoke with the patient.  He reports that his pain resolved at 4:00 am.  He feels much better now after pain meds and manually  Reducing hernia .  He just got up from a nap.  He was able to "push the hernia in last night".  He is feeling much better this am, no pain or fever.  He is advised to contact Dr. Lilyan Punt and advise him also of the problems he had last night.  They are trying to coordinate repair of hernia and back surgery.  He will call back for GI concerns

## 2012-06-15 ENCOUNTER — Ambulatory Visit (INDEPENDENT_AMBULATORY_CARE_PROVIDER_SITE_OTHER): Payer: Medicare Other | Admitting: Surgery

## 2012-06-15 ENCOUNTER — Encounter: Payer: Self-pay | Admitting: Internal Medicine

## 2012-06-15 ENCOUNTER — Ambulatory Visit (INDEPENDENT_AMBULATORY_CARE_PROVIDER_SITE_OTHER): Payer: Medicare Other | Admitting: Internal Medicine

## 2012-06-15 VITALS — BP 158/90 | HR 73 | Temp 98.1°F | Wt 184.0 lb

## 2012-06-15 DIAGNOSIS — I10 Essential (primary) hypertension: Secondary | ICD-10-CM

## 2012-06-15 DIAGNOSIS — G8929 Other chronic pain: Secondary | ICD-10-CM

## 2012-06-15 DIAGNOSIS — G4700 Insomnia, unspecified: Secondary | ICD-10-CM

## 2012-06-15 DIAGNOSIS — M545 Low back pain: Secondary | ICD-10-CM

## 2012-06-15 MED ORDER — HYDROCODONE-ACETAMINOPHEN 7.5-325 MG PO TABS: 1.0000 | ORAL_TABLET | Freq: Three times a day (TID) | ORAL | Status: DC | PRN

## 2012-06-15 MED ORDER — CLONAZEPAM 0.5 MG PO TABS: ORAL_TABLET | ORAL | Status: DC

## 2012-06-15 MED ORDER — HYDROCODONE-ACETAMINOPHEN 7.5-325 MG PO TABS: 1.0000 | ORAL_TABLET | Freq: Four times a day (QID) | ORAL | Status: DC | PRN

## 2012-06-15 NOTE — Patient Instructions (Signed)
Please take all new medication as prescribed Please continue all other medications as before 

## 2012-06-15 NOTE — Progress Notes (Signed)
Subjective:    Patient ID: Ryan [name], male    DOB: Apr 25, 1931, 79 y.o.   MRN: [id number]  HPI  Here with "lifetime" problem with sleeping, and ongoing RLS, hasnt liked to be on a regular medication for this trying to avoid tolerance, did do well at one point with ativan but worried about habituation and weaned himself off;  Has more recetly tried triazolam but did not work well for him and had to use "half a xanax" and a shower to get to sleep. In the past mo the triazolam was stopped so he would not be on two similar meds, but since then OTC meds not working for him.  In the past wk, has had worsening problem with RLS, with freq leg jerking that makes the sleep issue even worse. Seems to get "rigid legs" during the night.  Last night up to 4am.  Pt continues to have recurring LBP without change in severity, bowel or bladder change, fever, wt loss,  worsening LE pain/numbness/weakness, gait change or falls. Now taking ibuprofen 600 mg q8 otc, no GI issue yet.  Pt denies chest pain, increased sob or doe, wheezing, orthopnea, PND, increased LE swelling, palpitations, dizziness or syncope.   Past Medical History  Diagnosis Date  . Hyperlipemia   . Hypertension   . Benign prostatic hypertrophy   . Chronic fatigue   . Lactose intolerance   . Sleep disorder   . Personal history of colonic polyps 2004    hyperplastic Dr. Collene Mares  . Gluten intolerance   . Barrett's esophagus   . Esophageal reflux   . Diverticulosis of colon (without mention of hemorrhage)   . Periaortic lymphadenopathy 02/16/2011  . Atelectasis, bilateral 02/10/11    Noted On CT Scan - Mild Dependent Atelectasis at the Lung Bases  . Hiatal hernia 02/10/11    Noted on CT Scan - Moderate Hiatal Hernia  . Renal cyst 02/10/11     Noted on CT Scan - Bilateral Renal Cysts  . Osteoarthritis     hands/feet,knees,  . S/P radiation therapy 03/15/11 - 04/09/11    Abdominal/ Pelvic Tumor, 3600 cGy/20 Fractions  . Microcytic anemia 04/20/2011   .  Abdominal pain     Bloating and gas  . Gastritis   . Internal hemorrhoids   . Melanoma     lymphoma  . 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 Rotator cuff repair      right  . Hernia repair    . Cataract extraction, bilateral    . Tonsillectomy    . Arthroscopic repair acl      right  . Bone marrow aspiration  02/25/11    Bone Marrow, Aspirate, Clot, and Bilateral Bx, Right PIC    reports that he quit smoking about 20 years ago. His smoking use included Pipe. He has never used smokeless tobacco. He reports that  drinks alcohol. He reports that he does not use illicit drugs. family history includes ADD / ADHD in an unspecified family member; Heart disease in his father; Hyperlipidemia in an unspecified family member; Hypertension in an unspecified family member; Prostate cancer in his brother and paternal uncles; and Stroke in an unspecified family member.  There is no history of Colon cancer. Allergies  Allergen Reactions  . Molds &  Smuts Other (See Comments)    Also dust mites causes sinus infections, h/a etc.  . Pollen Extract-Tree Extract Other (See Comments)    HEADACHES, TIRED , DRAINAGE FROM SINUSES   Current Outpatient Prescriptions on File Prior to Visit  Medication Sig Dispense Refill  . ALPRAZolam (XANAX) 0.5 MG tablet take 1 tablet by mouth at bedtime  30 tablet  5  . amLODipine (NORVASC) 5 MG tablet Take 1 tablet (5 mg total) by mouth daily.  30 tablet  3  . b complex vitamins capsule Take 1 capsule by mouth daily.      . benazepril-hydrochlorthiazide (LOTENSIN HCT) 20-12.5 MG per tablet take 1 tablet by mouth once daily  30 tablet  11  . Biotin 1000 MCG tablet Take 1,000 mcg by mouth daily.      . diclofenac (VOLTAREN) 75 MG EC tablet Take 1 tablet (75 mg total) by mouth daily.  60  tablet  5  . EPINEPHrine (EPIPEN JR) 0.15 MG/0.3ML injection Inject 0.15 mg into the muscle as needed. For allergic reaction      . finasteride (PROSCAR) 5 MG tablet Take 1 tablet (5 mg total) by mouth daily.  30 tablet  11  . fluticasone (FLONASE) 50 MCG/ACT nasal spray Place 2 sprays into the nose as needed. For allergies       . loratadine (CLARITIN) 10 MG tablet Take 10 mg by mouth daily as needed. For allergies      . omeprazole (PRILOSEC) 20 MG capsule take 1 capsule by mouth once daily  90 capsule  3  . Red Yeast Rice Extract (RED YEAST RICE PO) Take 1 capsule by mouth daily.         No current facility-administered medications on file prior to visit.   Review of Systems  Constitutional: Negative for unexpected weight change, or unusual diaphoresis  HENT: Negative for tinnitus.   Eyes: Negative for photophobia and visual disturbance.  Respiratory: Negative for choking and stridor.   Gastrointestinal: Negative for vomiting and blood in stool.  Genitourinary: Negative for hematuria and decreased urine volume.  Musculoskeletal: Negative for acute joint swelling Skin: Negative for color change and wound.  Neurological: Negative for tremors and numbness other than noted  Psychiatric/Behavioral: Negative for decreased concentration or  hyperactivity.       Objective:   Physical Exam BP 158/90  Pulse 73  Temp(Src) 98.1 F (36.7 C) (Oral)  Wt 184 lb (83.462 kg)  BMI 29.71 kg/m2  SpO2 96% VS noted, not ill appearing Constitutional: Pt appears well-developed and well-nourished.  HENT: Head: NCAT.  Right Ear: External ear normal.  Left Ear: External ear normal.  Eyes: Conjunctivae and EOM are normal. Pupils are equal, round, and reactive to light.  Neck: Normal range of motion. Neck supple.  Cardiovascular: Normal rate and regular rhythm.   Pulmonary/Chest: Effort normal and breath sounds normal.  Abd:  Soft, NT, non-distended, + BS Spine: nontender, no paravertebral  tender Neurological: Pt is alert. Not confused , motor/dtr/gait intact Skin: Skin is warm. No erythema.  Psychiatric: Pt behavior is normal. Thought content normal.     Assessment & Plan:

## 2012-06-16 ENCOUNTER — Other Ambulatory Visit (INDEPENDENT_AMBULATORY_CARE_PROVIDER_SITE_OTHER): Payer: Self-pay | Admitting: General Surgery

## 2012-06-18 NOTE — Assessment & Plan Note (Signed)
stable overall by history and exam, recent data reviewed with pt, and pt to continue medical treatment as before,  to f/u any worsening symptoms or concerns BP Readings from Last 3 Encounters:  06/15/12 158/90  06/08/12 132/78  06/07/12 140/82

## 2012-06-18 NOTE — Assessment & Plan Note (Signed)
To try to simplify sleep, RLS and anxiety tx, will ask to d/c xanax, and try klonopin .5 am, 1 mg in PM,  to f/u any worsening symptoms or concerns

## 2012-06-18 NOTE — Assessment & Plan Note (Signed)
To avoid ibuprofen due to GI risk, for hydrocodone 7.5's tid prn, to f/u any worsening symptoms or concerns

## 2012-06-20 ENCOUNTER — Telehealth (INDEPENDENT_AMBULATORY_CARE_PROVIDER_SITE_OTHER): Payer: Self-pay

## 2012-06-20 NOTE — Telephone Encounter (Signed)
Pt called stating he has discussed with Dr Sharlett Iles having his hiatal hernia repaired at same time of ventral hernia repair. Pt states Dr Sharlett Iles advised him they should be done at same time. I advised pt I will send msg to Dr Lilyan Punt and his assistant to review this request. Pt can be reached at [phone]. Pt states a detailed msg can be left on his machine.

## 2012-06-23 ENCOUNTER — Telehealth (INDEPENDENT_AMBULATORY_CARE_PROVIDER_SITE_OTHER): Payer: Self-pay | Admitting: General Surgery

## 2012-06-23 NOTE — Telephone Encounter (Signed)
I called the patient to discuss the surgery.  He feels as thought the hernia is causing his symptoms and so we have decided to just do the ventral hernia repair now and see if this will relieve his symptoms without having to do a larger surgery such as HH repair and Nissen at age 79.  If his symptoms persist after ventral hernia repair, then we will offer him [facility] repair.  He agreed with this plan.

## 2012-07-20 ENCOUNTER — Other Ambulatory Visit: Payer: Self-pay | Admitting: Gastroenterology

## 2012-07-24 ENCOUNTER — Telehealth (INDEPENDENT_AMBULATORY_CARE_PROVIDER_SITE_OTHER): Payer: Self-pay

## 2012-07-24 NOTE — Telephone Encounter (Signed)
Patient is reporting more abdomen pain. He states he is needing to push the hernia back in frequently . He thinks he needs to have surgery sooner. He is now scheduled for 08/23/12

## 2012-08-11 ENCOUNTER — Ambulatory Visit
Admission: RE | Admit: 2012-08-11 | Discharge: 2012-08-11 | Disposition: A | Discharge status: Home / Self Care | Payer: Medicare Other | Source: Ambulatory Visit | Attending: Radiation Oncology | Admitting: Radiation Oncology

## 2012-08-11 ENCOUNTER — Encounter: Payer: Self-pay | Admitting: Radiation Oncology

## 2012-08-11 ENCOUNTER — Telehealth (INDEPENDENT_AMBULATORY_CARE_PROVIDER_SITE_OTHER): Payer: Self-pay

## 2012-08-11 VITALS — BP 117/68 | HR 70 | Temp 98.0°F | Ht 65.0 in | Wt 190.3 lb

## 2012-08-11 DIAGNOSIS — C8590 Non-Hodgkin lymphoma, unspecified, unspecified site: Secondary | ICD-10-CM

## 2012-08-11 NOTE — Progress Notes (Signed)
Mr. Statz is here for assessment following radiation therapy.  He is scheduled to have surgery for a ventral hernia on July 23 rd by Dr. Vivia Ewing.  He also is having issues with low back pain and will have an epidural injection prior to surgery by Dr. Nelva Bush.  Last scans by  Dr. Beryle Beams are attached.  Next scan on 10/10/12.

## 2012-08-11 NOTE — Telephone Encounter (Signed)
Staff message sent to Erin T in [place] scheduling to check Dr. Dyane Dustman OR time to see if patient could be moved to an earlier date due to an increase in pain.  At this time, patient is still scheduled for 08/23/12 for Hernia Repair.

## 2012-08-11 NOTE — Telephone Encounter (Signed)
COPY OF MESSAGE SENT TO OR SCHEDULER ON 07/28/2012.  Ivor Costa, [facility] - Surgery << Less Detail  [place], [place] [phone])     Sent: Fri July 28, 2012  1:54 PM    To: Khary Schaben    MRN: [id number] DOB: May 14, 1931    Pt Home: [phone]       Message   Patient calling into office to see if Dr. Dyane Dustman has any earlier OR dates available before 08/23/12?  He's having an increase in pain and would like to move it up if  possible.         Ivor Costa

## 2012-08-11 NOTE — Progress Notes (Signed)
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 He reports that a lot of his gastrointestinal pain has improved with Beano. However he has a lot of midline pain in his anterior abdomen related to a ventral hernia. He also has a large hiatal hernia. He has met with Gen. surgery about this. He has plans to surgically repair of the ventral hernia on 08/23/12. It is not felt the risks of repairing the hiatal hernia outweigh benefits.  He also has been dealing with low back issues and has received epidural injection for this in orthopedics.  Lymphoma stable per CT scan from April. He denies any B. symptoms. No new lumps or bumps palpated  ALLERGIES:  is allergic to molds & smuts and pollen extract-tree extract.  Meds: Current Outpatient Prescriptions  Medication Sig Dispense Refill  . amLODipine (NORVASC) 5 MG tablet Take 1 tablet (5 mg total) by mouth daily.  30 tablet  3  . b complex vitamins capsule Take 1 capsule by mouth daily.      . benazepril-hydrochlorthiazide (LOTENSIN HCT) 20-12.5 MG per tablet take 1 tablet by mouth once daily  30 tablet  11  . clonazePAM (KLONOPIN) 0.5 MG tablet 1 tab by mouth in the AM as needed for nerves, and 2 tab in the PM for sleep and RLS  90 tablet  1  . diclofenac (VOLTAREN) 75 MG EC tablet Take 1 tablet (75 mg total) by mouth daily.  60 tablet  5  . EPINEPHrine (EPIPEN JR) 0.15 MG/0.3ML injection Inject 0.15 mg into the muscle as needed. For allergic reaction      . finasteride (PROSCAR) 5 MG  tablet Take 1 tablet (5 mg total) by mouth daily.  30 tablet  11  . fluticasone (FLONASE) 50 MCG/ACT nasal spray Place 2 sprays into the nose as needed. For allergies       . HYDROcodone-acetaminophen (NORCO) 7.5-325 MG per tablet Take 1 tablet by mouth every 8 (eight) hours as needed for pain.  90 tablet  1  . loratadine (CLARITIN) 10 MG tablet Take 10 mg by mouth daily as needed. For allergies      . omeprazole (PRILOSEC) 20 MG capsule take 1 capsule by mouth once daily  90 capsule  3  . Red Yeast Rice Extract (RED YEAST RICE PO) Take 1 capsule by mouth daily.        Marland Kitchen ALPRAZolam (XANAX) 0.5 MG tablet take 1 tablet by mouth at bedtime  30 tablet  5   No current facility-administered medications for this encounter.    Physical Findings: The patient is in no acute distress. Patient is alert and oriented.  height is _0  (1.651 m) and weight is 190 lb 4.8 oz (86.32 kg). His temperature is 98 F (36.7 C). His blood pressure is 117/68 and his pulse is 70. .  Well-appearing. No palpable adenopathy in the cervical or supraclavicular neck. Abdominal exam is significant for a ventral hernia. Abdomen is soft nontender nondistended with no rigidity or guarding  Lab Findings: Lab Results  Component Value Date   WBC 4.0 04/07/2012   HGB 12.9* 04/07/2012   HCT 38.8 04/07/2012   MCV 83.1 04/07/2012   PLT 231 04/07/2012    Radiographic Findings: CT ANGIO ABD PELVIS 05/23/12 report:  IMPRESSION:  No evidence of mesenteric ischemia or mesenteric venous  abnormality.  Small anterior abdominal wall hernia. This may be the etiology of  patient's intermittent pain.  Small right lower lobe nodule which is new from March 2014. This  is likely inflammatory in nature.  Large hiatal hernia is stable from prior studies.  Although not mentioned in the body of the report there remains some  soft tissue density surrounding the aortic bifurcation and iliac  vein confluence. This is stable from previous exams.    Impression/Plan:  From an oncologic standpoint, he is doing well. He will follow up with medical oncology after a CT scan in September. I will see him back in one year. He knows I can certainly see him earlier than that, if needed (though I will be on maternity leave from November through January). He and his wife were comfortable with this plan  I spent 15 minutes minutes face to face with the patient and more than 50% of that time was spent in counseling and/or coordination of care. _____________________________________   Eppie Gibson, MD

## 2012-08-15 ENCOUNTER — Other Ambulatory Visit: Payer: Self-pay | Admitting: Internal Medicine

## 2012-08-17 ENCOUNTER — Other Ambulatory Visit: Payer: Self-pay

## 2012-08-17 MED ORDER — HYDROCODONE-ACETAMINOPHEN 7.5-325 MG PO TABS: 1.0000 | ORAL_TABLET | Freq: Three times a day (TID) | ORAL | Status: DC | PRN

## 2012-08-17 NOTE — Telephone Encounter (Signed)
Hydrocodone called to pharmacy

## 2012-08-18 ENCOUNTER — Ambulatory Visit: Payer: Medicare Other | Admitting: Radiation Oncology

## 2012-08-21 ENCOUNTER — Encounter (HOSPITAL_COMMUNITY): Payer: Self-pay | Admitting: Pharmacy Technician

## 2012-08-21 ENCOUNTER — Encounter (HOSPITAL_COMMUNITY)
Admission: RE | Admit: 2012-08-21 | Discharge: 2012-08-21 | Disposition: A | Discharge status: Home / Self Care | Payer: Medicare Other | Source: Ambulatory Visit | Attending: General Surgery | Admitting: General Surgery

## 2012-08-21 ENCOUNTER — Encounter (HOSPITAL_COMMUNITY): Payer: Self-pay

## 2012-08-21 HISTORY — DX: Pain, unspecified: R52

## 2012-08-21 HISTORY — DX: Cardiac arrhythmia, unspecified: I49.9

## 2012-08-21 HISTORY — DX: Other allergy status, other than to drugs and biological substances: Z91.09

## 2012-08-21 LAB — CBC
HCT: 39.2 % (ref 39.0–52.0)
Hemoglobin: 12.5 g/dL — ABNORMAL LOW (ref 13.0–17.0)
MCH: 26.7 pg (ref 26.0–34.0)
MCHC: 31.9 g/dL (ref 30.0–36.0)
MCV: 83.8 fL (ref 78.0–100.0)

## 2012-08-21 LAB — BASIC METABOLIC PANEL
BUN: 24 mg/dL — ABNORMAL HIGH (ref 6–23)
Creatinine, Ser: 0.95 mg/dL (ref 0.50–1.35)
GFR calc non Af Amer: 78 mL/min — ABNORMAL LOW (ref >90)
Glucose, Bld: 95 mg/dL (ref 70–99)
Potassium: 3.7 mEq/L (ref 3.5–5.1)

## 2012-08-21 NOTE — Patient Instructions (Signed)
YOUR SURGERY IS SCHEDULED AT [facility]  ON:   Wednesday 7/23  REPORT TO [facility] AT:  6:30 AM      PHONE # FOR SHORT STAY IS [phone]  DO NOT EAT OR DRINK ANYTHING AFTER MIDNIGHT THE NIGHT BEFORE YOUR SURGERY.  YOU MAY BRUSH YOUR TEETH, RINSE OUT YOUR MOUTH--BUT NO WATER, NO FOOD, NO CHEWING GUM, NO MINTS, NO CANDIES, NO CHEWING TOBACCO.  PLEASE TAKE THE FOLLOWING MEDICATIONS THE AM OF YOUR SURGERY WITH A FEW SIPS OF WATER:  AMLODIPINE, FINASTERIDE, HYDROCODONE / ACETAMINOPHEN, OMEPRAZOLE.    DO NOT BRING VALUABLES, MONEY, CREDIT CARDS.  DO NOT WEAR JEWELRY, MAKE-UP, NAIL POLISH AND NO METAL PINS OR CLIPS IN YOUR HAIR. CONTACT LENS, DENTURES / PARTIALS, GLASSES SHOULD NOT BE WORN TO SURGERY AND IN MOST CASES-HEARING AIDS WILL NEED TO BE REMOVED.  BRING YOUR GLASSES CASE, ANY EQUIPMENT NEEDED FOR YOUR CONTACT LENS. FOR PATIENTS ADMITTED TO THE HOSPITAL--CHECK OUT TIME THE DAY OF DISCHARGE IS 11:00 AM.  ALL INPATIENT ROOMS ARE PRIVATE - WITH BATHROOM, TELEPHONE, TELEVISION AND WIFI INTERNET.                                FAILURE TO FOLLOW THESE INSTRUCTIONS MAY RESULT IN THE CANCELLATION OF YOUR SURGERY.   PATIENT SIGNATURE_________________________________

## 2012-08-21 NOTE — Pre-Procedure Instructions (Signed)
CXR REPORT IN EPIC - DONE 04/07/12. EKG WAS DONE TODAY - PREOP - AT [facility].

## 2012-08-23 ENCOUNTER — Encounter (HOSPITAL_COMMUNITY)
Admission: RE | Disposition: A | Discharge status: Home / Self Care | Payer: Self-pay | Source: Ambulatory Visit | Attending: General Surgery

## 2012-08-23 ENCOUNTER — Ambulatory Visit (HOSPITAL_COMMUNITY)
Admission: RE | Admit: 2012-08-23 | Discharge: 2012-08-24 | Disposition: A | Discharge status: Home / Self Care | Payer: Medicare Other | Source: Ambulatory Visit | Attending: General Surgery | Admitting: General Surgery

## 2012-08-23 ENCOUNTER — Encounter (HOSPITAL_COMMUNITY): Payer: Self-pay | Admitting: Anesthesiology

## 2012-08-23 ENCOUNTER — Inpatient Hospital Stay (HOSPITAL_COMMUNITY): Payer: Medicare Other | Admitting: Anesthesiology

## 2012-08-23 ENCOUNTER — Encounter (HOSPITAL_COMMUNITY): Payer: Self-pay | Admitting: *Deleted

## 2012-08-23 DIAGNOSIS — K439 Ventral hernia without obstruction or gangrene: Secondary | ICD-10-CM

## 2012-08-23 DIAGNOSIS — K449 Diaphragmatic hernia without obstruction or gangrene: Secondary | ICD-10-CM | POA: Insufficient documentation

## 2012-08-23 DIAGNOSIS — E785 Hyperlipidemia, unspecified: Secondary | ICD-10-CM | POA: Insufficient documentation

## 2012-08-23 DIAGNOSIS — Z85820 Personal history of malignant melanoma of skin: Secondary | ICD-10-CM | POA: Insufficient documentation

## 2012-08-23 DIAGNOSIS — Z79899 Other long term (current) drug therapy: Secondary | ICD-10-CM | POA: Insufficient documentation

## 2012-08-23 DIAGNOSIS — Z01812 Encounter for preprocedural laboratory examination: Secondary | ICD-10-CM | POA: Insufficient documentation

## 2012-08-23 DIAGNOSIS — I10 Essential (primary) hypertension: Secondary | ICD-10-CM | POA: Insufficient documentation

## 2012-08-23 DIAGNOSIS — Z01810 Encounter for preprocedural cardiovascular examination: Secondary | ICD-10-CM | POA: Insufficient documentation

## 2012-08-23 DIAGNOSIS — K219 Gastro-esophageal reflux disease without esophagitis: Secondary | ICD-10-CM | POA: Insufficient documentation

## 2012-08-23 DIAGNOSIS — E669 Obesity, unspecified: Secondary | ICD-10-CM | POA: Insufficient documentation

## 2012-08-23 DIAGNOSIS — K2270 Barrett's esophagus without dysplasia: Secondary | ICD-10-CM | POA: Insufficient documentation

## 2012-08-23 DIAGNOSIS — Z87898 Personal history of other specified conditions: Secondary | ICD-10-CM | POA: Insufficient documentation

## 2012-08-23 HISTORY — PX: VENTRAL HERNIA REPAIR: SHX424

## 2012-08-23 HISTORY — PX: INSERTION OF MESH: SHX5868

## 2012-08-23 SURGERY — REPAIR, HERNIA, VENTRAL
Anesthesia: General | Site: Abdomen | Wound class: Clean

## 2012-08-23 MED ORDER — GLYCOPYRROLATE 0.2 MG/ML IJ SOLN
INTRAMUSCULAR | Status: DC | PRN
  Administered 2012-08-23: 0.2 mg via INTRAVENOUS
  Administered 2012-08-23: 0.6 mg via INTRAVENOUS

## 2012-08-23 MED ORDER — LACTATED RINGERS IV SOLN: INTRAVENOUS | Status: DC

## 2012-08-23 MED ORDER — BUPIVACAINE HCL (PF) 0.25 % IJ SOLN
INTRAMUSCULAR | Status: DC | PRN
  Administered 2012-08-23: 20 mL

## 2012-08-23 MED ORDER — PHENOL 1.4 % MT LIQD
1.0000 | OROMUCOSAL | Status: DC | PRN
  Administered 2012-08-23: 1 via OROMUCOSAL
  Filled 2012-08-23: qty 177

## 2012-08-23 MED ORDER — LIDOCAINE HCL (PF) 2 % IJ SOLN
INTRAMUSCULAR | Status: DC | PRN
  Administered 2012-08-23: 75 mg

## 2012-08-23 MED ORDER — NEOSTIGMINE METHYLSULFATE 1 MG/ML IJ SOLN
INTRAMUSCULAR | Status: DC | PRN
  Administered 2012-08-23: 4 mg via INTRAVENOUS

## 2012-08-23 MED ORDER — ONDANSETRON HCL 4 MG/2ML IJ SOLN
INTRAMUSCULAR | Status: DC | PRN
  Administered 2012-08-23: 4 mg via INTRAVENOUS

## 2012-08-23 MED ORDER — HYDROCHLOROTHIAZIDE 12.5 MG PO CAPS
12.5000 mg | ORAL_CAPSULE | Freq: Every day | ORAL | Status: DC
  Administered 2012-08-23: 12.5 mg via ORAL
  Filled 2012-08-23 (×2): qty 1

## 2012-08-23 MED ORDER — LACTATED RINGERS IV SOLN
INTRAVENOUS | Status: DC | PRN
  Administered 2012-08-23 (×2): via INTRAVENOUS

## 2012-08-23 MED ORDER — BENAZEPRIL-HYDROCHLOROTHIAZIDE 20-12.5 MG PO TABS: 1.0000 | ORAL_TABLET | Freq: Every morning | ORAL | Status: DC

## 2012-08-23 MED ORDER — HYDROCODONE-ACETAMINOPHEN 7.5-325 MG PO TABS
1.0000 | ORAL_TABLET | ORAL | Status: DC | PRN
  Administered 2012-08-23 – 2012-08-24 (×4): 1 via ORAL
  Filled 2012-08-23 (×4): qty 1

## 2012-08-23 MED ORDER — ROCURONIUM BROMIDE 100 MG/10ML IV SOLN
INTRAVENOUS | Status: DC | PRN
  Administered 2012-08-23: 40 mg via INTRAVENOUS
  Administered 2012-08-23: 5 mg via INTRAVENOUS

## 2012-08-23 MED ORDER — FINASTERIDE 5 MG PO TABS
5.0000 mg | ORAL_TABLET | Freq: Every day | ORAL | Status: DC
  Filled 2012-08-23: qty 1

## 2012-08-23 MED ORDER — SODIUM CHLORIDE 0.9 % IV SOLN
INTRAVENOUS | Status: DC
  Administered 2012-08-23: 13:00:00 via INTRAVENOUS

## 2012-08-23 MED ORDER — PROMETHAZINE HCL 25 MG/ML IJ SOLN: 6.2500 mg | INTRAMUSCULAR | Status: DC | PRN

## 2012-08-23 MED ORDER — ENOXAPARIN SODIUM 40 MG/0.4ML ~~LOC~~ SOLN
40.0000 mg | Freq: Every day | SUBCUTANEOUS | Status: DC
  Administered 2012-08-24: 40 mg via SUBCUTANEOUS
  Filled 2012-08-23: qty 0.4

## 2012-08-23 MED ORDER — CLONAZEPAM 0.5 MG PO TABS
0.5000 mg | ORAL_TABLET | Freq: Two times a day (BID) | ORAL | Status: DC | PRN
  Administered 2012-08-23: 1 mg via ORAL
  Filled 2012-08-23: qty 2

## 2012-08-23 MED ORDER — PROPOFOL 10 MG/ML IV BOLUS
INTRAVENOUS | Status: DC | PRN
  Administered 2012-08-23: 150 mg via INTRAVENOUS
  Administered 2012-08-23: 50 mg via INTRAVENOUS

## 2012-08-23 MED ORDER — AMLODIPINE BESYLATE 5 MG PO TABS
5.0000 mg | ORAL_TABLET | Freq: Every morning | ORAL | Status: DC
  Filled 2012-08-23: qty 1

## 2012-08-23 MED ORDER — ONDANSETRON HCL 4 MG/2ML IJ SOLN: 4.0000 mg | Freq: Four times a day (QID) | INTRAMUSCULAR | Status: DC | PRN

## 2012-08-23 MED ORDER — FENTANYL CITRATE 0.05 MG/ML IJ SOLN
INTRAMUSCULAR | Status: DC | PRN
  Administered 2012-08-23: 100 ug via INTRAVENOUS
  Administered 2012-08-23: 50 ug via INTRAVENOUS

## 2012-08-23 MED ORDER — EPHEDRINE SULFATE 50 MG/ML IJ SOLN
INTRAMUSCULAR | Status: DC | PRN
  Administered 2012-08-23: 5 mg via INTRAVENOUS

## 2012-08-23 MED ORDER — CEFAZOLIN SODIUM-DEXTROSE 2-3 GM-% IV SOLR
2.0000 g | INTRAVENOUS | Status: AC
  Administered 2012-08-23: 2 g via INTRAVENOUS

## 2012-08-23 MED ORDER — ONDANSETRON HCL 4 MG PO TABS: 4.0000 mg | ORAL_TABLET | Freq: Four times a day (QID) | ORAL | Status: DC | PRN

## 2012-08-23 MED ORDER — HYDROMORPHONE HCL PF 1 MG/ML IJ SOLN: 0.2500 mg | INTRAMUSCULAR | Status: DC | PRN

## 2012-08-23 MED ORDER — DEXAMETHASONE SODIUM PHOSPHATE 10 MG/ML IJ SOLN
INTRAMUSCULAR | Status: DC | PRN
  Administered 2012-08-23: 10 mg via INTRAVENOUS

## 2012-08-23 MED ORDER — MORPHINE SULFATE 2 MG/ML IJ SOLN
1.0000 mg | INTRAMUSCULAR | Status: DC | PRN
  Administered 2012-08-23: 2 mg via INTRAVENOUS
  Filled 2012-08-23: qty 1

## 2012-08-23 MED ORDER — LIDOCAINE-EPINEPHRINE (PF) 1 %-1:200000 IJ SOLN
INTRAMUSCULAR | Status: DC | PRN
  Administered 2012-08-23: 25 mL

## 2012-08-23 MED ORDER — BENAZEPRIL HCL 20 MG PO TABS
20.0000 mg | ORAL_TABLET | Freq: Every day | ORAL | Status: DC
  Administered 2012-08-23: 20 mg via ORAL
  Filled 2012-08-23 (×2): qty 1

## 2012-08-23 SURGICAL SUPPLY — 34 items
ADH SKN CLS APL DERMABOND .7 (GAUZE/BANDAGES/DRESSINGS) ×2
APL SKNCLS STERI-STRIP NONHPOA (GAUZE/BANDAGES/DRESSINGS)
BENZOIN TINCTURE PRP APPL 2/3 (GAUZE/BANDAGES/DRESSINGS) IMPLANT
BLADE HEX COATED 2.75 (ELECTRODE) ×2 IMPLANT
CANISTER SUCTION 2500CC (MISCELLANEOUS) ×2 IMPLANT
CLOTH BEACON ORANGE TIMEOUT ST (SAFETY) ×2 IMPLANT
DECANTER SPIKE VIAL GLASS SM (MISCELLANEOUS) ×2 IMPLANT
DERMABOND ADVANCED (GAUZE/BANDAGES/DRESSINGS) ×2
DERMABOND ADVANCED .7 DNX12 (GAUZE/BANDAGES/DRESSINGS) IMPLANT
DRAPE LAPAROSCOPIC ABDOMINAL (DRAPES) ×2 IMPLANT
ELECT REM PT RETURN 9FT ADLT (ELECTROSURGICAL) ×2
ELECTRODE REM PT RTRN 9FT ADLT (ELECTROSURGICAL) ×1 IMPLANT
GLOVE BIO SURGEON STRL SZ7 (GLOVE) ×2 IMPLANT
GLOVE BIOGEL PI IND STRL 7.0 (GLOVE) ×1 IMPLANT
GLOVE BIOGEL PI INDICATOR 7.0 (GLOVE) ×1
GLOVE SURG SS PI 7.5 STRL IVOR (GLOVE) ×4 IMPLANT
GOWN STRL NON-REIN LRG LVL3 (GOWN DISPOSABLE) ×2 IMPLANT
GOWN STRL REIN XL XLG (GOWN DISPOSABLE) ×3 IMPLANT
KIT BASIN OR (CUSTOM PROCEDURE TRAY) ×2 IMPLANT
MESH VENTRALEX ST 1-7/10 CRC S (Mesh General) ×1 IMPLANT
NEEDLE HYPO 22GX1.5 SAFETY (NEEDLE) ×1 IMPLANT
NS IRRIG 1000ML POUR BTL (IV SOLUTION) ×2 IMPLANT
PACK GENERAL/GYN (CUSTOM PROCEDURE TRAY) ×2 IMPLANT
SPONGE GAUZE 4X4 12PLY (GAUZE/BANDAGES/DRESSINGS) IMPLANT
SPONGE LAP 18X18 X RAY DECT (DISPOSABLE) IMPLANT
STAPLER VISISTAT 35W (STAPLE) IMPLANT
STRIP CLOSURE SKIN 1/2X4 (GAUZE/BANDAGES/DRESSINGS) IMPLANT
SUT ETHIBOND 0 MO6 C/R (SUTURE) ×1 IMPLANT
SUT MNCRL AB 4-0 PS2 18 (SUTURE) ×1 IMPLANT
SUT PROLENE 2 0 SH DA (SUTURE) ×4 IMPLANT
SUT VIC AB 3-0 SH 27 (SUTURE) ×2
SUT VIC AB 3-0 SH 27X BRD (SUTURE) IMPLANT
SYR CONTROL 10ML LL (SYRINGE) ×1 IMPLANT
TOWEL OR 17X26 10 PK STRL BLUE (TOWEL DISPOSABLE) ×2 IMPLANT

## 2012-08-23 NOTE — H&P (Signed)
Ryan [name] is a 79 y.o. male. HPI   This patient is known to me for evaluation of ventral hernia, hiatal hernia, and barretts esophagus.  He continues to have epigastric pain and pain around a midline ventral hernia.  It is reducible but does bulge out.    Past Medical History   Diagnosis  Date   .  Hyperlipemia    .  Hypertension    .  Benign prostatic hypertrophy    .  Chronic fatigue    .  Lactose intolerance    .  Sleep disorder    .  Personal history of colonic polyps  2004     hyperplastic Dr. Collene Mares   .  Gluten intolerance    .  Barrett's esophagus    .  Esophageal reflux    .  Diverticulosis of colon (without mention of hemorrhage)    .  Periaortic lymphadenopathy  02/16/2011   .  Atelectasis, bilateral  02/10/11     Noted On CT Scan - Mild Dependent Atelectasis at the Lung Bases   .  Hiatal hernia  02/10/11     Noted on CT Scan - Moderate Hiatal Hernia   .  Renal cyst  02/10/11     Noted on CT Scan - Bilateral Renal Cysts   .  Osteoarthritis      hands/feet,knees,   .  S/P radiation therapy  03/15/11 - 04/09/11     Abdominal/ Pelvic Tumor, 3600 cGy/20 Fractions   .  Microcytic anemia  04/20/2011   .  Abdominal pain      Bloating and gas   .  Gastritis    .  Internal hemorrhoids    .  Melanoma      lymphoma   .  Lymphoma of lymph nodes in pelvis  03/03/2011     Large Right Retroperitoneal Mass   .  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  Rotator cuff repair       right   .  Hernia repair     .  Cataract extraction, bilateral     .  Tonsillectomy     .  Arthroscopic repair acl       right   .  Bone marrow aspiration   02/25/11     Bone Marrow, Aspirate, Clot, and Bilateral Bx, Right PIC    Family History   Problem  Relation  Age of Onset   .  Heart disease  Father      MI 30   .  Prostate cancer  Brother    .  Prostate cancer  Paternal Uncle    .   Prostate cancer  Paternal Uncle    .  Prostate cancer  Paternal Uncle    .  Colon cancer  Neg Hx    .  Hyperlipidemia     .  Stroke     .  Hypertension     .  ADD / ADHD     Social History  History   Substance Use Topics   .  Smoking status:  Former Smoker -- 35 years     Types:  Pipe     Quit date:  02/23/1992   .  Smokeless tobacco:  Never Used      Comment: quit 20 years ago   .  Alcohol  Use:  Yes      Comment: 2 drinks daily scotch    Allergies   Allergen  Reactions   .  Molds & Smuts  Other (See Comments)     Also dust mites causes sinus infections, h/a etc.   .  Pollen Extract-Tree Extract  Other (See Comments)     HEADACHES, TIRED , DRAINAGE FROM SINUSES    Current Outpatient Prescriptions   Medication  Sig  Dispense  Refill   .  ALPRAZolam (XANAX) 0.5 MG tablet  take 1 tablet by mouth at bedtime  30 tablet  5   .  amLODipine (NORVASC) 5 MG tablet  Take 1 tablet (5 mg total) by mouth daily.  30 tablet  3   .  b complex vitamins capsule  Take 1 capsule by mouth daily.     .  benazepril-hydrochlorthiazide (LOTENSIN HCT) 20-12.5 MG per tablet  take 1 tablet by mouth once daily  30 tablet  11   .  Biotin 1000 MCG tablet  Take 1,000 mcg by mouth daily.     .  diclofenac (VOLTAREN) 75 MG EC tablet  Take 75 mg by mouth daily.     Marland Kitchen  EPINEPHrine (EPIPEN JR) 0.15 MG/0.3ML injection  Inject 0.15 mg into the muscle as needed. For allergic reaction     .  finasteride (PROSCAR) 5 MG tablet  Take 1 tablet (5 mg total) by mouth daily.  30 tablet  11   .  fluticasone (FLONASE) 50 MCG/ACT nasal spray  Place 2 sprays into the nose as needed. For allergies     .  HYDROcodone-acetaminophen (NORCO/VICODIN) 5-325 MG per tablet  Take 1 tablet by mouth every 6 (six) hours as needed for pain.     Marland Kitchen  loratadine (CLARITIN) 10 MG tablet  Take 10 mg by mouth daily as needed. For allergies     .  omeprazole (PRILOSEC) 20 MG capsule  take 1 capsule by mouth once daily  90 capsule  3   .  Red Yeast  Rice Extract (RED YEAST RICE PO)  Take 1 capsule by mouth daily.      No current facility-administered medications for this visit.   Review of Systems  Review of Systems  All other review of systems negative or noncontributory except as stated in the HPI  Wt Readings from Last 3 Encounters:  08/21/12 186 lb (84.369 kg)  08/11/12 190 lb 4.8 oz (86.32 kg)  06/15/12 184 lb (83.462 kg)   Temp Readings from Last 3 Encounters:  08/23/12 98.8 F (37.1 C)   08/23/12 98.8 F (37.1 C)   08/21/12 97.3 F (36.3 C) Oral   BP Readings from Last 3 Encounters:  08/23/12 146/91  08/23/12 146/91  08/21/12 134/83   Pulse Readings from Last 3 Encounters:  08/23/12 64  08/23/12 64  08/21/12 66    Physical Exam  Physical Exam  Physical Exam  Vitals reviewed.  Constitutional: He is oriented to person, place, and time. He appears well-developed and well-nourished. No distress.  HENT:  Head: Normocephalic and atraumatic.  Mouth/Throat: No oropharyngeal exudate.  Eyes: Conjunctivae and EOM are normal. Pupils are equal, round, and reactive to light. Right eye exhibits no discharge. Left eye exhibits no discharge. No scleral icterus.  Neck: Normal range of motion. No tracheal deviation present.  Cardiovascular: Normal rate, regular rhythm and normal heart sounds.  Pulmonary/Chest: Effort normal and breath sounds normal. No stridor. No respiratory distress. He has no wheezes. He  has no rales. He exhibits no tenderness.  Abdominal: Soft. Bowel sounds are normal. He exhibits no distension and no mass. There is no tenderness. There is no rebound and no guarding. He does have a mild midline diastasesWith a reducible ventral hernia in the upper midline. I was able to reduce the bulge and I cannot palpate the fascial defects which felt about 4 cm in diameter. He did have some mild tenderness after the hernia was reduced Musculoskeletal: Normal range of motion. He exhibits no edema and no tenderness.   Neurological: He is alert and oriented to person, place, and time.  Skin: Skin is warm and dry. No rash noted. He is not diaphoretic. No erythema. No pallor.  Psychiatric: He has a normal mood and affect. His behavior is normal. Judgment and thought content normal.  Data Reviewed  Physician notes, CT  Assessment  Ventral hernia-reducible  Hiatal hernia  Barrett's esophagus I discussed with him the options for repair of the ventral hernia which sounds like his main issue.  We discussed open and lap repair and we will plan for open ventral hernia repair with possible mesh.  I explained the risks of the procedure including infection, bleeding, pain, scarring, recurrence, persistent symptoms and pain, and bowel injury.  If this does not fix his symptoms then we will need to discuss possible hiatal hernia repair but I think that it is reasonable to see if this will fix his symptoms prior to performing a more extensive hiatal hernia repair. He agrees and desires to proceed with ventral hernia repair.

## 2012-08-23 NOTE — Op Note (Signed)
NAMERICHEY, DOOLITTLE NO.:  [id number]  MEDICAL RECORD NO.:  [id number]  LOCATION:  66                         FACILITY:  [facility]  PHYSICIAN:  Madilyn Hook, MD       DATE OF BIRTH:  07-Feb-1931  DATE OF PROCEDURE:  08/23/2012 DATE OF DISCHARGE:                              OPERATIVE REPORT   PROCEDURE:  Open ventral hernia repair with mesh.  PREOPERATIVE DIAGNOSIS:  Ventral hernia.  POSTOPERATIVE DIAGNOSIS:  Ventral hernia.  SURGEON:  Madilyn Hook, MD  ASSISTANT:  None.  ANESTHESIA:  General endotracheal anesthesia with 35 mL of 1% lidocaine with epinephrine and 0.25% Marcaine in a 50:50 mixture.  FLUIDS:  1 L crystalloid.  ESTIMATED BLOOD LOSS:  Minimal.  DRAINS:  None.  SPECIMENS:  None.  COMPLICATIONS:  None apparent.  FINDINGS:  A 1.5 to 2 cm midline fascial defect containing omentum and preperitoneal fat.  No evidence of bowel contents, surrounding fascia appeared healthy, and repair with primary closure over a small Ventralex ST hernia patch.  INDICATION FOR PROCEDURE:  Mr. Buerkle is a 79 year old male with reducible epigastric hernia on exam and on CT.  He has tenderness in the area of concern as well as a known hiatal hernia and Barrett esophagus. I discussed with him the option for hiatal hernia repair and repair of his ventral hernia and since he is tender in the area of his hernia, we decided to proceed with ventral hernia repair and see if this relieves his symptoms.  OPERATIVE DETAILS:  Mr. Dise was seen and evaluated in the preoperative area and risks and benefits of procedure were discussed again in lay terms.  Informed consent was obtained.  Again discussed with him the risk of possible needing repeat surgery and recurrent hernia as well as persistent pain and symptoms postoperatively.  He expressed understanding and desired to proceed with ventral hernia repair.  We marked the defect with Valsalva prior to  anesthetic administration and he was taken to the operating room and placed on table in supine position.  General endotracheal anesthesia was obtained, and his abdomen was prepped and draped in standard surgical fashion. Procedure time-out was performed with all operative team members, confirmed proper patient and procedure, and a midline incision made over the palpable defect.  We carried the dissection down to the fascia and the hernia contents were easily reducible, which were noted to be fatty contents, a preperitoneal fat or omentum.  As I isolated the fascia, he was noted to have about 1.5-2 cm fascial defect and I was able to palpate on the undersurface of the fascia widely around the area and I did not see any intestine in the area.  After the fascia was cleared circumferentially, given his age and relative inactivity, I selected a small Ventralex ST hernia patch and placed 2-0 Prolene sutures at the 12 o'clock, 3 o'clock, 6 o'clock, and 9 o'clock position on the mesh and these were __________ through the fascia under direct visualization and the mesh was placed in the preperitoneal space.  The sutures were secured and the mesh appeared to be centered in the hernia defect and covered the defect.  The tail of  the mesh was trimmed in the middle and the fascial defect was then approximated with interrupted 0 Ethibond sutures incorporating a small portion of the central aspect of the mesh and the tails of the mesh with the sutures.  The fascia was approximated over the defect.  Again the surrounding fascia appeared healthy and no other obvious defects.  He did have decent space where the hernia sac was, which is a possible source for postoperative seroma but did not feel it was large enough for drain placement.  The wound was irrigated with sterile saline solution until irrigation returned clear, and the wound was noted to be hemostatic.  The dermis was approximated with interrupted  3-0 Vicryl sutures and the skin edges were approximated with 4-0 Monocryl subcuticular suture.  Skin was washed and dried and Dermabond was applied.  All sponge, needle, and instrument counts correct at the end of the case.  The patient tolerated the procedure well without apparent complication.          ______________________________ Madilyn Hook, MD     BL/MEDQ  D:  08/23/2012  T:  08/23/2012  Job:  [id number]

## 2012-08-23 NOTE — Transfer of Care (Signed)
Immediate Anesthesia Transfer of Care Note  Patient: Ryan [name]  Procedure(s) Performed: Procedure(s): HERNIA REPAIR VENTRAL ADULT (N/A) INSERTION OF MESH (N/A)  Patient Location: PACU  Anesthesia Type:General  Level of Consciousness: awake, alert , oriented and patient cooperative  Airway & Oxygen Therapy: Patient Spontanous Breathing and Patient connected to face mask oxygen  Post-op Assessment: Report given to PACU RN, Post -op Vital signs reviewed and stable and Patient moving all extremities X 4  Post vital signs: Reviewed and stable  Complications: No apparent anesthesia complications

## 2012-08-23 NOTE — Brief Op Note (Signed)
08/23/2012  10:08 AM  PATIENT:  Ryan [name]  79 y.o. male  PRE-OPERATIVE DIAGNOSIS:  ventral hernia  POST-OPERATIVE DIAGNOSIS:  ventral hernia   PROCEDURE:  Procedure(s): HERNIA REPAIR VENTRAL ADULT (N/A) INSERTION OF MESH (N/A)  SURGEON:  Surgeon(s) and Role:    * Madilyn Hook, DO - Primary  PHYSICIAN ASSISTANT:   ASSISTANTS: none   ANESTHESIA:   general  EBL:  Total I/O In: 1100 [I.V.:1100] Out: -   BLOOD ADMINISTERED:none  DRAINS: none   LOCAL MEDICATIONS USED:  MARCAINE    and LIDOCAINE   SPECIMEN:  No Specimen  DISPOSITION OF SPECIMEN:  N/A  COUNTS:  YES  TOURNIQUET:  * No tourniquets in log *  DICTATION: .Other Dictation: Dictation Number dictated  PLAN OF CARE: Admit for overnight observation  PATIENT DISPOSITION:  PACU - hemodynamically stable.   Delay start of Pharmacological VTE agent (>24hrs) due to surgical blood loss or risk of bleeding: no

## 2012-08-23 NOTE — Anesthesia Preprocedure Evaluation (Signed)
Anesthesia Evaluation  Patient identified by MRN, date of birth, ID band Patient awake    Reviewed: Allergy & Precautions, H&P , NPO status , Patient's Chart, lab work & pertinent test results  Airway Mallampati: II TM Distance: >3 FB Neck ROM: Full    Dental no notable dental hx.    Pulmonary neg pulmonary ROS,  breath sounds clear to auscultation  Pulmonary exam normal       Cardiovascular Exercise Tolerance: Good hypertension, Pt. on medications negative cardio ROS  + dysrhythmias Rhythm:Regular Rate:Normal     Neuro/Psych PSYCHIATRIC DISORDERS  Neuromuscular disease    GI/Hepatic negative GI ROS, Neg liver ROS, hiatal hernia, GERD-  Medicated,  Endo/Other  negative endocrine ROS  Renal/GU Renal disease  negative genitourinary   Musculoskeletal negative musculoskeletal ROS (+)   Abdominal (+) + obese,   Peds negative pediatric ROS (+)  Hematology negative hematology ROS (+) Blood dyscrasia, ,   Anesthesia Other Findings   Reproductive/Obstetrics negative OB ROS                           Anesthesia Physical Anesthesia Plan  ASA: III  Anesthesia Plan: General   Post-op Pain Management:    Induction: Intravenous  Airway Management Planned: Oral ETT  Additional Equipment:   Intra-op Plan:   Post-operative Plan: Extubation in OR  Informed Consent: I have reviewed the patients History and Physical, chart, labs and discussed the procedure including the risks, benefits and alternatives for the proposed anesthesia with the patient or authorized representative who has indicated his/her understanding and acceptance.   Dental advisory given  Plan Discussed with: CRNA  Anesthesia Plan Comments:         Anesthesia Quick Evaluation

## 2012-08-23 NOTE — Progress Notes (Signed)
Doing okay.  HD stable. No issues.

## 2012-08-23 NOTE — Anesthesia Postprocedure Evaluation (Signed)
  Anesthesia Post-op Note  Patient: Ryan [name]  Procedure(s) Performed: Procedure(s) (LRB): HERNIA REPAIR VENTRAL ADULT (N/A) INSERTION OF MESH (N/A)  Patient Location: PACU  Anesthesia Type: General  Level of Consciousness: awake and alert   Airway and Oxygen Therapy: Patient Spontanous Breathing  Post-op Pain: mild  Post-op Assessment: Post-op Vital signs reviewed, Patient's Cardiovascular Status Stable, Respiratory Function Stable, Patent Airway and No signs of Nausea or vomiting  Last Vitals:  Filed Vitals:   08/23/12 1112  BP: 156/91  Pulse: 80  Temp: 36.6 C  Resp: 18    Post-op Vital Signs: stable   Complications: No apparent anesthesia complications

## 2012-08-24 ENCOUNTER — Encounter (HOSPITAL_COMMUNITY): Payer: Self-pay | Admitting: General Surgery

## 2012-08-24 ENCOUNTER — Telehealth (INDEPENDENT_AMBULATORY_CARE_PROVIDER_SITE_OTHER): Payer: Self-pay | Admitting: General Surgery

## 2012-08-24 MED ORDER — HYDROCODONE-ACETAMINOPHEN 7.5-325 MG PO TABS: 1.0000 | ORAL_TABLET | ORAL | Status: DC | PRN

## 2012-08-24 NOTE — Progress Notes (Signed)
Assessment unchanged. Pt verbalized understanding of dc instructions through teach back. Script x 1 given as provided by MD. Pt already has My Chart account set up at home. Discharged via wc to front entrance to meet wife and awaiting vehicle to carry home. Accompanied by NT.

## 2012-08-24 NOTE — Telephone Encounter (Signed)
Spoke with pt and informed him that he has his first PO appt w/ Dr. Lilyan Punt on 8/7 at 9:00 w/ arrival time of 8:45.  He said this was fine and that he was feeling well from surgery.

## 2012-08-24 NOTE — Progress Notes (Signed)
1 Day Post-Op  Subjective: Feels well.  No nausea.  Pain controlled  Objective: Vital signs in last 24 hours: Temp:  [97.6 F (36.4 C)-99.1 F (37.3 C)] 97.8 F (36.6 C) (07/24 0605) Pulse Rate:  [74-98] 75 (07/24 0605) Resp:  [11-20] 18 (07/24 0605) BP: (113-159)/(68-91) 136/81 mmHg (07/24 0605) SpO2:  [90 %-100 %] 91 % (07/24 0605) Weight:  [193 lb (87.544 kg)] 193 lb (87.544 kg) (07/23 1154) Last BM Date: 08/22/12  Intake/Output from previous day: 07/23 0701 - 07/24 0700 In: 2226.3 [P.O.:480; I.V.:1746.3] Out: 1600 [Urine:1600] Intake/Output this shift:    General appearance: alert, cooperative and no distress Resp: nonlabored Cardio: normal rate, regular GI: soft, minimal incisional tenderness, wound looks good, no evidence of recurrence, no peritoneal signs Extremities: SCD's bilat  Lab Results:   Recent Labs  08/21/12 1115  WBC 4.9  HGB 12.5*  HCT 39.2  PLT 266   BMET  Recent Labs  08/21/12 1100  NA 139  K 3.7  CL 103  CO2 29  GLUCOSE 95  BUN 24*  CREATININE 0.95  CALCIUM 8.7   PT/INR No results found for this basename: LABPROT, INR,  in the last 72 hours ABG No results found for this basename: PHART, PCO2, PO2, HCO3,  in the last 72 hours  Studies/Results: No results found.  Anti-infectives: Anti-infectives   Start     Dose/Rate Route Frequency Ordered Stop   08/23/12 0704  ceFAZolin (ANCEF) IVPB 2 g/50 mL premix     2 g 100 mL/hr over 30 Minutes Intravenous On call to O.R. 08/23/12 1194 08/23/12 1740      Assessment/Plan: s/p Procedure(s): HERNIA REPAIR VENTRAL ADULT (N/A) INSERTION OF MESH (N/A) Discharge no evidence of postop complications.  he looks and feels okay.  should be okay for discharge today  LOS: 1 day    [place], [place] 08/24/2012

## 2012-08-31 ENCOUNTER — Other Ambulatory Visit: Payer: Self-pay | Admitting: Internal Medicine

## 2012-08-31 NOTE — Telephone Encounter (Signed)
Refill request for  Last filled by MD on - Last AEX -  Next AEX -

## 2012-09-01 NOTE — Telephone Encounter (Signed)
Clonazepam called to pharmacy

## 2012-09-07 ENCOUNTER — Encounter (INDEPENDENT_AMBULATORY_CARE_PROVIDER_SITE_OTHER): Payer: Self-pay | Admitting: General Surgery

## 2012-09-07 ENCOUNTER — Ambulatory Visit (INDEPENDENT_AMBULATORY_CARE_PROVIDER_SITE_OTHER): Payer: Medicare Other | Admitting: General Surgery

## 2012-09-07 VITALS — BP 103/74 | HR 74 | Temp 97.2°F | Resp 16 | Wt 186.6 lb

## 2012-09-07 DIAGNOSIS — Z5189 Encounter for other specified aftercare: Secondary | ICD-10-CM

## 2012-09-07 DIAGNOSIS — Z4889 Encounter for other specified surgical aftercare: Secondary | ICD-10-CM

## 2012-09-07 NOTE — Progress Notes (Signed)
Subjective:     Patient ID: Ryan [name], male   DOB: 07-03-1931, 79 y.o.   MRN: [id number]  HPI This patient follows up status post open repair of epigastric hernia with mesh. He says he is doing very well and has no pain in the area of his hernia and that his preoperative abdominal pain has improved. He still has some other GI symptoms which he attributes to his lactose and gluten intolerance. He has not had any nausea or vomiting. He still takes pain medication for his back pain which is ongoing. He has no complaints with regard to his hernia.  Review of Systems     Objective:   Physical Exam His abdomen is soft and nontender exam his incision is healing nicely without any sign of infection.  There is no evidence of any recurrence with Valsalva    Assessment:     Status post open ventral hernia repair with mesh-doing well He is doing very well from this procedure and is no evidence of any postoperative complications. There is no evidence of recurrent hernia. It seems as though he has had a good result with his hernia repair with regard to treatment of his abdominal pain. We did discuss treatment of his hernia and he seems to be relatively asymptomatic from this at this time and I would not do certainly recommend any hiatal hernia repair at his age since he is asymptomatic. However, if he is symptoms that we can attribute to his hiatal hernia  then we can certainly revisit this possibility.     Plan:     Light duty for another 2 weeks and then gradually increase activity as tolerated and if he will followup with me when necessary

## 2012-09-15 ENCOUNTER — Telehealth: Payer: Self-pay | Admitting: *Deleted

## 2012-09-15 NOTE — Telephone Encounter (Signed)
Pt called states the Clonazepam is no longer working.  Please advise

## 2012-09-18 NOTE — Telephone Encounter (Signed)
Appointment scheduled.

## 2012-09-18 NOTE — Telephone Encounter (Signed)
Happy to see in the office this week.

## 2012-09-20 ENCOUNTER — Encounter: Payer: Self-pay | Admitting: Internal Medicine

## 2012-09-20 ENCOUNTER — Telehealth: Payer: Self-pay | Admitting: *Deleted

## 2012-09-20 ENCOUNTER — Ambulatory Visit (INDEPENDENT_AMBULATORY_CARE_PROVIDER_SITE_OTHER): Payer: Medicare Other | Admitting: Internal Medicine

## 2012-09-20 VITALS — BP 174/98 | HR 79 | Temp 97.7°F | Wt 189.0 lb

## 2012-09-20 DIAGNOSIS — G2581 Restless legs syndrome: Secondary | ICD-10-CM

## 2012-09-20 DIAGNOSIS — G4700 Insomnia, unspecified: Secondary | ICD-10-CM

## 2012-09-20 DIAGNOSIS — I10 Essential (primary) hypertension: Secondary | ICD-10-CM

## 2012-09-20 MED ORDER — TRIAZOLAM 0.25 MG PO TABS: 0.2500 mg | ORAL_TABLET | Freq: Every evening | ORAL | Status: DC | PRN

## 2012-09-20 MED ORDER — ROPINIROLE HCL 0.5 MG PO TABS: 0.2500 mg | ORAL_TABLET | Freq: Three times a day (TID) | ORAL | Status: DC

## 2012-09-20 NOTE — Telephone Encounter (Signed)
TRista from the pharmacy called requesting clarification on Ropinrole 0.58m, states rx reads take 0.5 tablets (0.269m  TID but pt says Rx should be take 0.5 tablet (0.252monce daily.  Please advise

## 2012-09-20 NOTE — Telephone Encounter (Signed)
Spoke with Ulice Brilliant advised of MDs message

## 2012-09-20 NOTE — Telephone Encounter (Signed)
Yes - once a day. The automatic print function sent it off - realized the error and gave the patient the right directions.

## 2012-09-20 NOTE — Patient Instructions (Addendum)
1. Intermittent restless leg syndrome - as you have said, a life-long issue.  Plan Requip every night until you have gone a reasonable period of time without symptoms at which point you may stop  Resume Requip if symptoms recur.  2. Sleep duration insomnia -    Sleep is a learned or unlearned behavior. 5 principles of sleep hygiene - 1) regular hour to retire and rise 7days/wk 2) no stimulants - caffeine, chocolat, alcohol, 3) regular exercise  - every afternoon  4) sleep sanctuary - a space that is right light, temperature, sound level, good bed where all you do is sleep. 5) No extinction behaviors, e.g. Laying in bed awake doing anything but sleeping. This means if you have a bad night - no naps, etc  The operant conditioning is difficult!! But, this is the best long term solution and worth the effort For use every third night or so - Halcion 0.68m  1/2 tab or 1 tab as needed.  Insomnia Insomnia is frequent trouble falling and/or staying asleep. Insomnia can be a long term problem or a short term problem. Both are common. Insomnia can be a short term problem when the wakefulness is related to a certain stress or worry. Long term insomnia is often related to ongoing stress during waking hours and/or poor sleeping habits. Overtime, sleep deprivation itself can make the problem worse. Every little thing feels more severe because you are overtired and your ability to cope is decreased. CAUSES   Stress, anxiety, and depression.  Poor sleeping habits.  Distractions such as TV in the bedroom.  Naps close to bedtime.  Engaging in emotionally charged conversations before bed.  Technical reading before sleep.  Alcohol and other sedatives. They may make the problem worse. They can hurt normal sleep patterns and normal dream activity.  Stimulants such as caffeine for several hours prior to bedtime.  Pain syndromes and shortness of breath can cause insomnia.  Exercise late at  night.  Changing time zones may cause sleeping problems (jet lag). It is sometimes helpful to have someone observe your sleeping patterns. They should look for periods of not breathing during the night (sleep apnea). They should also look to see how long those periods last. If you live alone or observers are uncertain, you can also be observed at a sleep clinic where your sleep patterns will be professionally monitored. Sleep apnea requires a checkup and treatment. Give your caregivers your medical history. Give your caregivers observations your family has made about your sleep.  SYMPTOMS   Not feeling rested in the morning.  Anxiety and restlessness at bedtime.  Difficulty falling and staying asleep. TREATMENT   Your caregiver may prescribe treatment for an underlying medical disorders. Your caregiver can give advice or help if you are using alcohol or other drugs for self-medication. Treatment of underlying problems will usually eliminate insomnia problems.  Medications can be prescribed for short time use. They are generally not recommended for lengthy use.  Over-the-counter sleep medicines are not recommended for lengthy use. They can be habit forming.  You can promote easier sleeping by making lifestyle changes such as:  Using relaxation techniques that help with breathing and reduce muscle tension.  Exercising earlier in the day.  Changing your diet and the time of your last meal. No night time snacks.  Establish a regular time to go to bed.  Counseling can help with stressful problems and worry.  Soothing music and white noise may be helpful if there are  background noises you cannot remove.  Stop tedious detailed work at least one hour before bedtime. HOME CARE INSTRUCTIONS   Keep a diary. Inform your caregiver about your progress. This includes any medication side effects. See your caregiver regularly. Take note of:  Times when you are asleep.  Times when you are awake  during the night.  The quality of your sleep.  How you feel the next day. This information will help your caregiver care for you.  Get out of bed if you are still awake after 15 minutes. Read or do some quiet activity. Keep the lights down. Wait until you feel sleepy and go back to bed.  Keep regular sleeping and waking hours. Avoid naps.  Exercise regularly.  Avoid distractions at bedtime. Distractions include watching television or engaging in any intense or detailed activity like attempting to balance the household checkbook.  Develop a bedtime ritual. Keep a familiar routine of bathing, brushing your teeth, climbing into bed at the same time each night, listening to soothing music. Routines increase the success of falling to sleep faster.  Use relaxation techniques. This can be using breathing and muscle tension release routines. It can also include visualizing peaceful scenes. You can also help control troubling or intruding thoughts by keeping your mind occupied with boring or repetitive thoughts like the old concept of counting sheep. You can make it more creative like imagining planting one beautiful flower after another in your backyard garden.  During your day, work to eliminate stress. When this is not possible use some of the previous suggestions to help reduce the anxiety that accompanies stressful situations. MAKE SURE YOU:   Understand these instructions.  Will watch your condition.  Will get help right away if you are not doing well or get worse. Document Released: 01/16/2000 Document Revised: 04/12/2011 Document Reviewed: 02/15/2007 [facility] Patient Information 2014 [place].

## 2012-09-21 DIAGNOSIS — G2581 Restless legs syndrome: Secondary | ICD-10-CM | POA: Insufficient documentation

## 2012-09-21 MED ORDER — ROPINIROLE HCL 0.5 MG PO TABS: 0.2500 mg | ORAL_TABLET | Freq: Every day | ORAL | Status: DC | PRN

## 2012-09-21 NOTE — Assessment & Plan Note (Signed)
Life long h/o intermittent bouts of RLS that can last for weeks then be in remission for long periods of time. He has tried medications most recently Klonipin.  Plan Educated about RLS  Trial of Requip -.25 mg qHS during periods of time when RLS is active, leaving it off when in remission. May need to advance dose.

## 2012-09-21 NOTE — Progress Notes (Signed)
Subjective:    Patient ID: Ryan [name], male    DOB: 1931-06-28, 79 y.o.   MRN: [id number]  HPI Mr. Westbrook had requested a change from klonipin as sleep medication and was asked to come in to discuss problem(s)  He reports a life-long h/o intermittent bouts of restless leg problems that can be severe and interfere with sleep. He has tried a variety of physical treatments and various medications. He has never had a neurologic consult for this problem.  Sleep duration insomnia has been a long standing problem for which he has tried several medications. He has a habit of only sleep 90-120 minutes, arising, using alcohol for a soporific, warm shower and return to bed for another 90-12 minutes of sleep. He never feels rested after such nights and he will take day time naps. He reports that klonipin helped initially but is no longer working.  Past Medical History  Diagnosis Date  . Hyperlipemia   . Hypertension   . Benign prostatic hypertrophy   . Chronic fatigue   . Lactose intolerance   . Sleep disorder     DOES NOT SLEEP WELL  . Personal history of colonic polyps 2004    hyperplastic Dr. Collene Mares  . Gluten intolerance   . Barrett's esophagus   . Esophageal reflux   . Diverticulosis of colon (without mention of hemorrhage)   . Periaortic lymphadenopathy 02/16/2011  . Atelectasis, bilateral 02/10/11    Noted On CT Scan - Mild Dependent Atelectasis at the Lung Bases  . Hiatal hernia 02/10/11    Noted on CT Scan - Moderate Hiatal Hernia  . Renal cyst 02/10/11     Noted on CT Scan - Bilateral Renal Cysts  . S/P radiation therapy 03/15/11 - 04/09/11    Abdominal/ Pelvic Tumor, 3600 cGy/20 Fractions  . Microcytic anemia 04/20/2011   . Abdominal pain     Bloating and gas  . Gastritis   . Melanoma     lymphoma  . 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  Dysrhythmia     SKIPPED HEART BEAT SOMETIMES  . Environmental allergies   . Pain     LOWER BACK PAIN - DDD, SCOLIOSIS, BONE SPUR.  FINISHED THE 3RD EPIDURAL STEROID INJECTION 7/15 - DONE BY DR. Nelva Bush. - STILL HAVING BACK PAIN  . Osteoarthritis     hands/feet,knees, NECK, BACK   Past Surgical History  Procedure Laterality Date  . Rotator cuff repair      right  . Cataract extraction, bilateral    . Tonsillectomy    . Arthroscopic repair acl      right  . Bone marrow aspiration  02/25/11    Bone Marrow, Aspirate, Clot, and Bilateral Bx, Right PIC  . Hernia repair      LEFT INGUINAL   . Ventral hernia repair N/A 08/23/2012    Procedure: HERNIA REPAIR VENTRAL ADULT;  Surgeon: Madilyn Hook, DO;  Location: [facility];  Service: General;  Laterality: N/A;  . Insertion of mesh N/A 08/23/2012    Procedure: INSERTION OF MESH;  Surgeon: Madilyn Hook, DO;  Location: [facility];  Service: General;  Laterality: N/A;   Family History  Problem Relation Age of Onset  . Heart disease Father     MI 66  . Prostate cancer Brother   . Prostate cancer Paternal Uncle   .  Prostate cancer Paternal Uncle   . Prostate cancer Paternal Uncle   . Colon cancer Neg Hx   . Hyperlipidemia    . Stroke    . Hypertension    . ADD / ADHD     History   Social History  . Marital Status: Married    Spouse Name: N/A    Number of Children: 2  . Years of Education: N/A   Occupational History  . 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 Smoking status: Former Smoker -- 35 years    Types: Pipe    Quit date: 02/23/1992  . Smokeless tobacco: Never Used     Comment: quit 20 years ago  . Alcohol Use: Yes     Comment: 2 drinks daily scotch  AND WINE WITH SUPPER  . Drug Use: No  . Sexual Activity: Not on file   Other Topics Concern  . Not on file   Social History Narrative   Married - second marriage   He has two children   Retired Horticulturist, commercial Professor   Currently teaches  pottery making, 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[facility]   Former Smoker quit 12 years ago- smoked for 35 years   Alcohol use- yes    Current Outpatient Prescriptions on File Prior to Visit  Medication Sig Dispense Refill  . amLODipine (NORVASC) 5 MG tablet Take 5 mg by mouth every morning.      Marland Kitchen b complex vitamins capsule Take 1 capsule by mouth daily.      . benazepril-hydrochlorthiazide (LOTENSIN HCT) 20-12.5 MG per tablet Take 1 tablet by mouth every morning.      . calcium carbonate (TUMS - DOSED IN MG ELEMENTAL CALCIUM) 500 MG chewable tablet Chew 2 tablets by mouth at bedtime as needed for heartburn.      . clonazePAM (KLONOPIN) 0.5 MG tablet take 1 tablet by mouth every morning if needed for nerves then 2 tablets every evening for sleep and RLS  90 tablet  1  . diclofenac (VOLTAREN) 75 MG EC tablet Take 75 mg by mouth daily.      . finasteride (PROSCAR) 5 MG tablet Take 5 mg by mouth daily.      Marland Kitchen HYDROcodone-acetaminophen (NORCO) 7.5-325 MG per tablet Take 1 tablet by mouth every 8 (eight) hours as needed for pain (PT HAS BEEN TAKING PAIN PILL 3 TIMES A DAY ON A REGULAR BASIS FOR HERNIA AND BACK PAIN).       Marland Kitchen HYDROcodone-acetaminophen (NORCO) 7.5-325 MG per tablet Take 1 tablet by mouth every 4 (four) hours as needed.  30 tablet  0  . loratadine (CLARITIN) 10 MG tablet Take 10 mg by mouth daily as needed. For allergies      . omeprazole (PRILOSEC) 20 MG capsule Take 20 mg by mouth daily.      . Red Yeast Rice Extract (RED YEAST RICE PO) Take 1 capsule by mouth daily.        Marland Kitchen EPINEPHrine (EPIPEN JR) 0.15 MG/0.3ML injection Inject 0.15 mg into the muscle as needed. For allergic reaction      . fluticasone (FLONASE) 50 MCG/ACT nasal spray Place 2 sprays into the nose as needed. For allergies        No current facility-administered medications on file prior to visit.      Review of Systems System review is negative for any constitutional, cardiac, pulmonary, GI or neuro symptoms or  complaints other  than as described in the HPI.     Objective:   Physical Exam Filed Vitals:   09/20/12 1039  BP: 174/98  Pulse: 79  Temp: 97.7 F (36.5 C)   BP Readings from Last 3 Encounters:  09/20/12 174/98  09/07/12 103/74  08/24/12 136/81   Wt Readings from Last 3 Encounters:  09/20/12 189 lb (85.73 kg)  09/07/12 186 lb 9.6 oz (84.641 kg)  08/23/12 193 lb (87.544 kg)   Gen'l - WNWD white man who walked in w/o assist device HEENT- C&S clear, Cor - RRR Pulm - normal respirations Neuro - A&O x 3, normal gait       Assessment & Plan:

## 2012-09-21 NOTE — Assessment & Plan Note (Signed)
BP Readings from Last 3 Encounters:  09/20/12 174/98  09/07/12 103/74  08/24/12 136/81   Usually he has good control - today's reading is an anomaly.  Plan He is to check BP at home and report back.

## 2012-09-21 NOTE — Assessment & Plan Note (Addendum)
Dr. Rosalia Hammers reports sleep duration insomnia. Reviewed sleep hygiene in detail. Stressed the importance of operant conditioning in reestablishing a better sleep habit.  Plan Sleep hygiene  Halcion 0.125 mg qHS every 3rd night as needed.  (greater than 50% of 30 min visit spent on education and counseling)

## 2012-09-26 ENCOUNTER — Ambulatory Visit (INDEPENDENT_AMBULATORY_CARE_PROVIDER_SITE_OTHER): Payer: Medicare Other | Admitting: Internal Medicine

## 2012-09-26 ENCOUNTER — Encounter: Payer: Self-pay | Admitting: Internal Medicine

## 2012-09-26 VITALS — BP 168/108 | HR 69 | Temp 98.6°F | Wt 185.8 lb

## 2012-09-26 DIAGNOSIS — C8596 Non-Hodgkin lymphoma, unspecified, intrapelvic lymph nodes: Secondary | ICD-10-CM

## 2012-09-26 DIAGNOSIS — Z8719 Personal history of other diseases of the digestive system: Secondary | ICD-10-CM

## 2012-09-26 DIAGNOSIS — I10 Essential (primary) hypertension: Secondary | ICD-10-CM

## 2012-09-26 MED ORDER — LEVSIN/SL 0.125 MG SL SUBL: 0.1250 mg | SUBLINGUAL_TABLET | SUBLINGUAL | Status: DC | PRN

## 2012-09-26 NOTE — Patient Instructions (Addendum)
Blood pressure excursions: the CT angio did mention that there are single renal arteries (normal to have one) with calcification but no mention of blockage. The cause is at this point idiopathic.  Plan Continue benazepril/hct  Increase amlodipine to 10 mg daily  Report back on results   Abdominal cramping - work up is negative to date  Plan Levisn SL (brand name) - chew up fine and swallow every 4 hours as needed for cramps.

## 2012-09-26 NOTE — Progress Notes (Signed)
Subjective:    Patient ID: Ryan [name], male    DOB: 1931-12-01, 79 y.o.   MRN: [id number]  HPI Ryan [name] presents for excursions of blood pressure up to 190/110. He has had headache. Did have a CT angio for evaluation for mesenteric ischemia - renal arteries were noted as calcified but not obstructed ( have asked Dr. Liborio Nixon to review).   Past Medical History  Diagnosis Date  . Hyperlipemia   . Hypertension   . Benign prostatic hypertrophy   . Chronic fatigue   . Lactose intolerance   . Sleep disorder     DOES NOT SLEEP WELL  . Personal history of colonic polyps 2004    hyperplastic Dr. Collene Mares  . Gluten intolerance   . Barrett's esophagus   . Esophageal reflux   . Diverticulosis of colon (without mention of hemorrhage)   . Periaortic lymphadenopathy 02/16/2011  . Atelectasis, bilateral 02/10/11    Noted On CT Scan - Mild Dependent Atelectasis at the Lung Bases  . Hiatal hernia 02/10/11    Noted on CT Scan - Moderate Hiatal Hernia  . Renal cyst 02/10/11     Noted on CT Scan - Bilateral Renal Cysts  . S/P radiation therapy 03/15/11 - 04/09/11    Abdominal/ Pelvic Tumor, 3600 cGy/20 Fractions  . Microcytic anemia 04/20/2011   . Abdominal pain     Bloating and gas  . Gastritis   . Melanoma     lymphoma  . 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 Dysrhythmia     SKIPPED HEART BEAT SOMETIMES  . Environmental allergies   . Pain     LOWER BACK PAIN - DDD, SCOLIOSIS, BONE SPUR.  FINISHED THE 3RD EPIDURAL STEROID INJECTION 7/15 - DONE BY DR. Nelva Bush. - STILL HAVING BACK PAIN  . Osteoarthritis     hands/feet,knees, NECK, BACK   Past Surgical History  Procedure Laterality Date  . Rotator cuff repair      right  . Cataract extraction, bilateral    . Tonsillectomy    . Arthroscopic repair acl      right  . Bone marrow aspiration  02/25/11    Bone  Marrow, Aspirate, Clot, and Bilateral Bx, Right PIC  . Hernia repair      LEFT INGUINAL   . Ventral hernia repair N/A 08/23/2012    Procedure: HERNIA REPAIR VENTRAL ADULT;  Surgeon: Madilyn Hook, DO;  Location: [facility];  Service: General;  Laterality: N/A;  . Insertion of mesh N/A 08/23/2012    Procedure: INSERTION OF MESH;  Surgeon: Madilyn Hook, DO;  Location: [facility];  Service: General;  Laterality: N/A;   Family History  Problem Relation Age of Onset  . Heart disease Father     MI 39  . Prostate cancer Brother   . Prostate cancer Paternal Uncle   . Prostate cancer Paternal Uncle   . Prostate cancer Paternal Uncle   . Colon cancer Neg Hx   . Hyperlipidemia    . Stroke    . Hypertension    . ADD / ADHD     History   Social History  . Marital Status: Married    Spouse Name: N/A    Number of Children: 2  . Years of Education: N/A   Occupational History  . 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 Smoking status: Former Smoker -- 35 years    Types: Pipe    Quit date: 02/23/1992  . Smokeless tobacco: Never Used     Comment: quit 20 years ago  . Alcohol Use: Yes     Comment: 2 drinks daily scotch  AND WINE WITH SUPPER  . Drug Use: No  . Sexual Activity: Not on file   Other Topics Concern  . Not on file   Social History Narrative   Married - second marriage   He has two children   Retired Horticulturist, commercial Professor   Currently teaches pottery making, 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[facility]   Former Smoker quit 12 years ago- smoked for 35 years   Alcohol use- yes    Current Outpatient Prescriptions on File Prior to Visit  Medication Sig Dispense Refill  . amLODipine (NORVASC) 5 MG tablet Take 5 mg by mouth every morning.      Marland Kitchen b complex vitamins capsule Take 1 capsule by mouth daily.      . benazepril-hydrochlorthiazide (LOTENSIN HCT) 20-12.5 MG per tablet Take 1 tablet by mouth every morning.      . calcium carbonate (TUMS - DOSED IN MG  ELEMENTAL CALCIUM) 500 MG chewable tablet Chew 2 tablets by mouth at bedtime as needed for heartburn.      . diclofenac (VOLTAREN) 75 MG EC tablet Take 75 mg by mouth daily.      Marland Kitchen EPINEPHrine (EPIPEN JR) 0.15 MG/0.3ML injection Inject 0.15 mg into the muscle as needed. For allergic reaction      . finasteride (PROSCAR) 5 MG tablet Take 5 mg by mouth daily.      . fluticasone (FLONASE) 50 MCG/ACT nasal spray Place 2 sprays into the nose as needed. For allergies       . HYDROcodone-acetaminophen (NORCO) 7.5-325 MG per tablet Take 1 tablet by mouth every 8 (eight) hours as needed for pain (PT HAS BEEN TAKING PAIN PILL 3 TIMES A DAY ON A REGULAR BASIS FOR HERNIA AND BACK PAIN).       Marland Kitchen HYDROcodone-acetaminophen (NORCO) 7.5-325 MG per tablet Take 1 tablet by mouth every 4 (four) hours as needed.  30 tablet  0  . loratadine (CLARITIN) 10 MG tablet Take 10 mg by mouth daily as needed. For allergies      . omeprazole (PRILOSEC) 20 MG capsule Take 20 mg by mouth daily.      . Red Yeast Rice Extract (RED YEAST RICE PO) Take 1 capsule by mouth daily.        Marland Kitchen rOPINIRole (REQUIP) 0.5 MG tablet Take 0.5 tablets (0.25 mg total) by mouth daily as needed.  15 tablet  2  . triazolam (HALCION) 0.25 MG tablet Take 1 tablet (0.25 mg total) by mouth at bedtime as needed.  30 tablet  0   No current facility-administered medications on file prior to visit.      Review of Systems System review is negative for any constitutional, cardiac, pulmonary, GI or neuro symptoms or complaints other than as described in the HPI.     Objective:   Physical Exam Filed Vitals:   09/26/12 1144  BP: 168/108  Pulse: 69  Temp: 98.6 F (37 C)   BP Readings from Last 3 Encounters:  09/26/12 168/108  09/20/12 174/98  09/07/12 103/74   Cor - RRR Neuro - non-focal       Assessment & Plan:

## 2012-09-28 NOTE — Assessment & Plan Note (Signed)
Continued episodes of abdominal cramping. Work up has been unrevealing  Plan Levsin SL (brnd name) prn

## 2012-09-28 NOTE — Assessment & Plan Note (Signed)
Poor control with significant excursion of SBP. He has been essentially asymptomatic. Reviewed CT angio which did reveal single renal arteries with calcification but not stenosis.  Plan Increase amlodipine to 10 mg daily  Continue lotensin/hct  Report back on BP control - further changes as needed, e.g. Change from benazepril to ARB

## 2012-10-10 ENCOUNTER — Other Ambulatory Visit (HOSPITAL_BASED_OUTPATIENT_CLINIC_OR_DEPARTMENT_OTHER): Payer: Medicare Other

## 2012-10-10 ENCOUNTER — Ambulatory Visit (HOSPITAL_COMMUNITY)
Admission: RE | Admit: 2012-10-10 | Discharge: 2012-10-10 | Disposition: A | Discharge status: Home / Self Care | Payer: Medicare Other | Source: Ambulatory Visit | Attending: Oncology | Admitting: Oncology

## 2012-10-10 DIAGNOSIS — K439 Ventral hernia without obstruction or gangrene: Secondary | ICD-10-CM | POA: Insufficient documentation

## 2012-10-10 DIAGNOSIS — Z923 Personal history of irradiation: Secondary | ICD-10-CM | POA: Insufficient documentation

## 2012-10-10 DIAGNOSIS — I7090 Unspecified atherosclerosis: Secondary | ICD-10-CM | POA: Insufficient documentation

## 2012-10-10 DIAGNOSIS — C8590 Non-Hodgkin lymphoma, unspecified, unspecified site: Secondary | ICD-10-CM

## 2012-10-10 DIAGNOSIS — R590 Localized enlarged lymph nodes: Secondary | ICD-10-CM

## 2012-10-10 DIAGNOSIS — K5730 Diverticulosis of large intestine without perforation or abscess without bleeding: Secondary | ICD-10-CM | POA: Insufficient documentation

## 2012-10-10 DIAGNOSIS — K7689 Other specified diseases of liver: Secondary | ICD-10-CM | POA: Insufficient documentation

## 2012-10-10 DIAGNOSIS — N281 Cyst of kidney, acquired: Secondary | ICD-10-CM | POA: Insufficient documentation

## 2012-10-10 DIAGNOSIS — C8596 Non-Hodgkin lymphoma, unspecified, intrapelvic lymph nodes: Secondary | ICD-10-CM

## 2012-10-10 LAB — COMPREHENSIVE METABOLIC PANEL (CC13)
Albumin: 3.8 g/dL (ref 3.5–5.0)
CO2: 29 mEq/L (ref 22–29)
Glucose: 91 mg/dl (ref 70–140)
Potassium: 3.9 mEq/L (ref 3.5–5.1)
Sodium: 141 mEq/L (ref 136–145)
Total Protein: 6.7 g/dL (ref 6.4–8.3)

## 2012-10-10 LAB — CBC WITH DIFFERENTIAL/PLATELET
Basophils Absolute: 0 10*3/uL (ref 0.0–0.1)
EOS%: 3.1 % (ref 0.0–7.0)
HCT: 40.2 % (ref 38.4–49.9)
HGB: 13.1 g/dL (ref 13.0–17.1)
LYMPH%: 27 % (ref 14.0–49.0)
MCH: 26.4 pg — ABNORMAL LOW (ref 27.2–33.4)
MONO#: 0.5 10*3/uL (ref 0.1–0.9)
NEUT%: 58.3 % (ref 39.0–75.0)
Platelets: 252 10*3/uL (ref 140–400)
lymph#: 1.2 10*3/uL (ref 0.9–3.3)

## 2012-10-10 MED ORDER — IOHEXOL 300 MG/ML  SOLN
100.0000 mL | Freq: Once | INTRAMUSCULAR | Status: AC | PRN
  Administered 2012-10-10: 100 mL via INTRAVENOUS

## 2012-10-13 ENCOUNTER — Telehealth: Payer: Self-pay | Admitting: Oncology

## 2012-10-13 ENCOUNTER — Ambulatory Visit (HOSPITAL_BASED_OUTPATIENT_CLINIC_OR_DEPARTMENT_OTHER): Payer: Medicare Other | Admitting: Oncology

## 2012-10-13 VITALS — BP 138/78 | HR 84 | Temp 96.8°F | Resp 19 | Ht 66.5 in | Wt 182.1 lb

## 2012-10-13 DIAGNOSIS — C8589 Other specified types of non-Hodgkin lymphoma, extranodal and solid organ sites: Secondary | ICD-10-CM

## 2012-10-13 DIAGNOSIS — C8590 Non-Hodgkin lymphoma, unspecified, unspecified site: Secondary | ICD-10-CM

## 2012-10-13 NOTE — Telephone Encounter (Signed)
Gave pt oral contrast and appt for lab and MD on March 2015

## 2012-10-15 NOTE — Progress Notes (Signed)
Hematology and Oncology Follow Up Visit  Ryan [name] [id number] April 20, 1931 79 y.o. 10/15/2012 12:47 PM   Principle Diagnosis: Encounter Diagnosis  Name Primary?  . 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 He was found to have bulky pelvic disease but no significant disease outside the pelvis on CT or PET scanning. In retrospect, a scan dated 07/30/2005 and read as normal, showed an area of soft tissue density in the same area as the bulky lymphadenopathy first described in January 2013.   The remainder of his staging evaluation was unremarkable. There was a rare, small, lymphoid aggregate  seen in the bone marrow.  After discussion of treatment options, I recommended involved field radiation. He completed treatments given between February 11 and April 09, 2011 with 3600 cGy in 20 fractions.  He had a significant response to radiation treatment. There is still some residual soft tissue density which has now been stable on scans including the study done in anticipation of today's visit on 10/10/2012 which I reviewed with him today.  He continues to have intermittent problems with his GI tract with atypical pain. He recently underwent a CT angiogram of the abdomen and pelvis on 05/23/2012 and there is no evidence for significant vascular compromise to suggest that he has mesenteric ischemia as etiology of his GI complaints. A small ventral hernia was detected at the time of the study. He went on to have this repaired and he has had some relief of his symptoms but not completely.  Medications: reviewed  Allergies:  Allergies  Allergen Reactions  . Molds & Smuts Other (See Comments)    Also dust mites causes sinus infections, h/a  etc.  . Pollen Extract-Tree Extract Other (See Comments)    HEADACHES, TIRED , DRAINAGE FROM SINUSES    Review of Systems: Constitutional: No constitutional symptoms   HEENT no sore throat Respiratory: No cough or dyspnea Cardiovascular:  No ischemic type chest pain or palpitations Gastrointestinal: See above Genito-Urinary: No urinary tract symptoms  Musculoskeletal: Chronic arthritis pain of his back  Neurologic: No headache or change in vision Skin: No rash or ecchymosis Remaining ROS negative.     Physical Exam: Blood pressure 138/78, pulse 84, temperature 96.8 F (36 C), temperature source Oral, resp. rate 19, height 5' 6.5" (1.689 m), weight 182 lb 1.6 oz (82.6 kg). Wt Readings from Last 3 Encounters:  10/13/12 182 lb 1.6 oz (82.6 kg)  09/26/12 185 lb 12.8 oz (84.278 kg)  09/20/12 189 lb (85.73 kg)     General appearance: Well-nourished Caucasian man HENNT: Pharynx no erythema or exudate. Neck supple. No thyromegaly. Lymph nodes: No cervical, supraclavicular, axillary, or inguinal adenopathy Breasts: Lungs: Clear to auscultation resonant to percussion Heart: Regular rhythm no murmur, no gallop Abdomen: Soft, nontender, no mass, no organomegaly Extremities: No edema, no calf tenderness Musculoskeletal: No joint deformities GU: Vascular: No carotid bruits, no cyanosis Neurologic: Mental status intact, cranial nerves grossly normal, motor strength 5 over 5, reflexes 1+ symmetric Skin: No rash or ecchymosis  Lab Results: Lab Results  Component Value Date   WBC 4.3 10/10/2012   HGB 13.1 10/10/2012   HCT 40.2 10/10/2012   MCV 81.1 10/10/2012   PLT 252 10/10/2012     Chemistry      Component  Value Date/Time   NA 141 10/10/2012 0934   NA 139 08/21/2012 1100   NA 137 08/17/2011 1043   K 3.9 10/10/2012 0934   K 3.7 08/21/2012 1100   K 3.9 08/17/2011 1043   CL 103 08/21/2012 1100   CL 105 04/07/2012 0925   CL 100 08/17/2011 1043   CO2 29 10/10/2012 0934   CO2 29 08/21/2012 1100    CO2 30 08/17/2011 1043   BUN 15.8 10/10/2012 0934   BUN 24* 08/21/2012 1100   BUN 18 08/17/2011 1043   CREATININE 1.0 10/10/2012 0934   CREATININE 0.95 08/21/2012 1100   CREATININE 0.9 08/17/2011 1043      Component Value Date/Time   CALCIUM 8.7 10/10/2012 0934   CALCIUM 8.7 08/21/2012 1100   CALCIUM 8.4 08/17/2011 1043   ALKPHOS 79 10/10/2012 0934   ALKPHOS 68 08/17/2011 1043   ALKPHOS 65 05/21/2011 1035   AST 22 10/10/2012 0934   AST 26 08/17/2011 1043   AST 17 05/21/2011 1035   ALT 24 10/10/2012 0934   ALT 20 08/17/2011 1043   ALT 16 05/21/2011 1035   BILITOT 0.65 10/10/2012 0934   BILITOT 0.60 08/17/2011 1043   BILITOT 0.3 05/21/2011 1035       Radiological Studies: Ct Abdomen Pelvis W Contrast  10/10/2012   *RADIOLOGY REPORT*  Clinical Data: History of lymphoma.  18 months status post radiation therapy.  CT ABDOMEN AND PELVIS WITH CONTRAST  Technique:  Multidetector CT imaging of the abdomen and pelvis was performed following the standard protocol during bolus administration of intravenous contrast.  Contrast: 142m OMNIPAQUE IOHEXOL 300 MG/ML  SOLN  Comparison: CT of the abdomen and pelvis 05/23/2012.  Findings:  Lung Bases: Large hiatal hernia.  Borderline cardiomegaly.  Abdomen/Pelvis:  6 mm low attenuation lesion in segment eight of the liver is too small to definitively characterize, but is similar to the prior study, and favored to represent a benign lesion such as a small cyst.  No other focal hepatic lesions are otherwise noted.  The appearance of the gallbladder, pancreas, spleen and bilateral adrenal glands is unremarkable.  Multiple subcentimeter low attenuation lesions in the kidneys bilaterally, too small to definitively characterize.  In addition, extending exophytically from the upper pole of the right kidney posteriorly there is a 3.0 cm low attenuation lesion which does not enhance, compatible with a small simple cyst.  There is also a 1.5 cm simple cyst extending exophytically off the lateral  aspect of the upper pole of the right kidney.  Extensive atherosclerosis of the abdominal and pelvic vasculature, without definite aneurysm or dissection.  As with the prior examination there is some sessile soft tissue prominence around the proximal right external and internal iliac arteries, as well as the distal right common iliac artery, and around the confluence of the common iliac veins into the inferior vena cava, which likely represent treated lymphoma.  No definite enlargement of soft tissue in this region is noted compared to the prior study.  No additional pathologic lymphadenopathy is otherwise noted within the abdomen or pelvis on today's examination.  Colonic diverticulosis without findings to suggest acute diverticulitis at this time.  Normal appendix.  No significant volume of ascites.  No pneumoperitoneum.  No pathologic distension of small bowel.  Small ventral hernia containing only a small amount of omental stress fat incidentally noted.  Prostate gland and urinary bladder are unremarkable in appearance.  Musculoskeletal: There are no aggressive appearing lytic or blastic lesions noted in the  visualized portions of the skeleton.  IMPRESSION: 1.  Although there is some residual soft tissue prominence in the right hemipelvis, as above, this is most compatible with treated lymphoma.  No definite findings to suggest local recurrence of disease or new sites of disease in the abdomen or pelvis on today's examination. 2.  Colonic diverticulosis without findings to suggest acute diverticulitis at this time. 3.  Extensive atherosclerosis without evidence of aneurysm or dissection. 4.  Additional incidental findings, as above, similar to prior studies.   Original Report Authenticated By: Vinnie Langton, M.D.    Impression: #1. Localized  bulky low-grade B-cell non-Hodgkin's lymphoma.  Excellent response to radiation therapy. Stable residual soft tissue density over serial scans since July 2013. No  evidence of new or progressive disease now out 2-1/2 years from completion of radiation.  Plan: Continue periodic followup. He'll get a scan again in 6 months and then at annual intervals after that.   #2. Chronic GI complaints.  See discussion above.  #3. Progressive degenerative arthritis of the spine     CC:. Dr. Adella Hare, Verl Blalock, Blair Promise, MD 9/14/201412:47 PM

## 2012-11-03 ENCOUNTER — Other Ambulatory Visit: Payer: Self-pay | Admitting: Internal Medicine

## 2012-11-06 ENCOUNTER — Encounter: Payer: Self-pay | Admitting: Oncology

## 2012-11-06 ENCOUNTER — Encounter: Payer: Self-pay | Admitting: Internal Medicine

## 2012-11-06 ENCOUNTER — Telehealth: Payer: Self-pay | Admitting: *Deleted

## 2012-11-06 MED ORDER — AMLODIPINE BESYLATE 10 MG PO TABS: ORAL_TABLET | ORAL | Status: DC

## 2012-11-06 MED ORDER — HYDROCODONE-ACETAMINOPHEN 7.5-325 MG PO TABS: 1.0000 | ORAL_TABLET | Freq: Three times a day (TID) | ORAL | Status: DC | PRN

## 2012-11-06 MED ORDER — TRIAZOLAM 0.25 MG PO TABS: 0.2500 mg | ORAL_TABLET | Freq: Every evening | ORAL | Status: DC | PRN

## 2012-11-06 NOTE — Telephone Encounter (Signed)
Received a fax from [facility] stating patient says he takes Amlodipin 5 mg BID. Script sent over today does not reflect this. Please advise. Thank you.

## 2012-11-06 NOTE — Telephone Encounter (Signed)
Amlodipine was not requested in original note. Ok to refill amlodipine prn.

## 2012-11-06 NOTE — Telephone Encounter (Signed)
Pt called requesting hydrocodone and Triazolam refill.  Please advise

## 2012-11-06 NOTE — Telephone Encounter (Signed)
OK for refill hydrocodone #30 day supple with 3 scripts. OK for refill on triazolam #30 with 5 refill

## 2012-11-06 NOTE — Telephone Encounter (Signed)
triazolam called in, Hydrocodone scripts prepared x 3 will call patient to pick up when signed

## 2012-11-26 ENCOUNTER — Encounter: Payer: Self-pay | Admitting: Internal Medicine

## 2012-11-29 ENCOUNTER — Ambulatory Visit: Payer: Medicare Other | Admitting: Internal Medicine

## 2012-11-29 ENCOUNTER — Encounter: Payer: Self-pay | Admitting: Internal Medicine

## 2012-11-29 ENCOUNTER — Ambulatory Visit (INDEPENDENT_AMBULATORY_CARE_PROVIDER_SITE_OTHER): Payer: Medicare Other | Admitting: Internal Medicine

## 2012-11-29 VITALS — BP 130/84 | HR 65 | Temp 97.8°F | Wt 188.0 lb

## 2012-11-29 DIAGNOSIS — G8929 Other chronic pain: Secondary | ICD-10-CM

## 2012-11-29 DIAGNOSIS — I10 Essential (primary) hypertension: Secondary | ICD-10-CM

## 2012-11-29 DIAGNOSIS — I872 Venous insufficiency (chronic) (peripheral): Secondary | ICD-10-CM

## 2012-11-29 DIAGNOSIS — M545 Low back pain: Secondary | ICD-10-CM

## 2012-11-29 NOTE — Assessment & Plan Note (Signed)
Chronic but worsening problem. Reviewed chart - normal renal function, no history of heart failure. Exam w/o advanced changes and swelling does resolve over night.  Plan Compression hose - start with otc products.

## 2012-11-29 NOTE — Progress Notes (Signed)
  Subjective:    Patient ID: Ryan [name], male    DOB: 08/10/1931, 79 y.o.   MRN: [id number]  HPI Ryan [name] presents for progressive lower extremity edema over the past several weeks. In the AM on awakening his legs are not swollen but rapidly swell during the course of the day. He has had no skin breakdown. No h/o vascular disease.  He is having progressive problems with back pain. Dr. Rolena Infante is proposing surgery at this point. Ryan [name] inquires as to the advisability of a second opinion.  PMH, FamHx and SocHx reviewed for any changes and relevance.  Current Outpatient Prescriptions on File Prior to Visit  Medication Sig Dispense Refill  . amLODipine (NORVASC) 5 MG tablet Take 10 mg by mouth every morning.       Marland Kitchen b complex vitamins capsule Take 1 capsule by mouth daily.      . benazepril-hydrochlorthiazide (LOTENSIN HCT) 20-12.5 MG per tablet Take 1 tablet by mouth every morning.      . calcium carbonate (TUMS - DOSED IN MG ELEMENTAL CALCIUM) 500 MG chewable tablet Chew 2 tablets by mouth at bedtime as needed for heartburn.      . diclofenac (VOLTAREN) 75 MG EC tablet Take 75 mg by mouth daily.      Marland Kitchen EPINEPHrine (EPIPEN JR) 0.15 MG/0.3ML injection Inject 0.15 mg into the muscle as needed. For allergic reaction      . finasteride (PROSCAR) 5 MG tablet Take 5 mg by mouth daily.      . fluticasone (FLONASE) 50 MCG/ACT nasal spray Place 2 sprays into the nose as needed. For allergies       . HYDROcodone-acetaminophen (NORCO) 7.5-325 MG per tablet Take 1 tablet by mouth every 8 (eight) hours as needed for pain (PT HAS BEEN TAKING PAIN PILL 3 TIMES A DAY ON A REGULAR BASIS FOR HERNIA AND BACK PAIN). May fill on or after 01/06/2013  90 tablet  0  . LEVSIN/SL 0.125 MG SL tablet Take 1 tablet (0.125 mg total) by mouth every 4 (four) hours as needed for cramping. Chew up fine and swallow  30 tablet  3  . loratadine (CLARITIN) 10 MG tablet Take 10 mg by mouth daily as needed. For allergies       . omeprazole (PRILOSEC) 20 MG capsule Take 20 mg by mouth daily.      . Red Yeast Rice Extract (RED YEAST RICE PO) Take 1 capsule by mouth daily.        . triazolam (HALCION) 0.25 MG tablet Take 1 tablet (0.25 mg total) by mouth at bedtime as needed.  30 tablet  5  . amLODipine (NORVASC) 10 MG tablet take 1 tablet by mouth once daily  90 tablet  3   No current facility-administered medications on file prior to visit.       Review of Systems System review is negative for any constitutional, cardiac, pulmonary, GI or neuro symptoms or complaints other than as described in the HPI.     Objective:   Physical Exam Filed Vitals:   11/29/12 1108  BP: 130/84  Pulse: 65  Temp: 97.8 F (36.6 C)   gen'l - WNWD white man in no distress Cor - 2+ radial, RRR, 3+ lower extremity edema to level of calve. No skin breakdown. Pulm - normal respirations Neuro - A&O x 3       Assessment & Plan:

## 2012-11-29 NOTE — Patient Instructions (Addendum)
1.  Lower extremity edema - most likely venous insufficiency. In regard to severity a 3/10, i.e. No skin breakdown, no ulceration and it does still resolve over night.  Plan  mechanical treatment, i.e. Compression. Start with otc hosiery.   2. Back surgery - it is always reasonable to get a second opinion about major surgery.  I suggest getting a second opinion, should you choose to go that route, from Dr. Kristeen Miss.  Venous Stasis and Chronic Venous Insufficiency As people age, the veins located in their legs may weaken and stretch. When veins weaken and lose the ability to pump blood effectively, the condition is called chronic venous insufficiency (CVI) or venous stasis. Almost all veins return blood back to the heart. This happens by:  The force of the heart pumping fresh blood pushes blood back to the heart.  Blood flowing to the heart from the force of gravity. In the deep veins of the legs, blood has to fight gravity and flow upstream back to the heart. Here, the leg muscles contract to pump blood back toward the heart. Vein walls are elastic, and many veins have small valves that only allow blood to flow in one direction. When leg muscles contract, they push inward against the elastic vein walls. This squeezes blood upward, opens the valves, and moves blood toward the heart. When leg muscles relax, the vein wall also relaxes and the valves inside the vein close to prevent blood from flowing backward. This method of pumping blood out of the legs is called the venous pump. CAUSES  The venous pump works best while walking and leg muscles are contracting. But when a person sits or stands, blood pressure in leg veins can build. Deep veins are usually able to withstand short periods of inactivity, but long periods of inactivity (and increased pressure) can stretch, weaken, and damage vein walls. High blood pressure can also stretch and damage vein walls. The veins may no longer be able to pump  blood back to the heart. Venous hypertension (high blood pressure inside veins) that lasts over time is a primary cause of CVI. CVI can also be caused by:   Deep vein thrombosis, a condition where a thrombus (blood clot) blocks blood flow in a vein.  Phlebitis, an inflammation of a superficial vein that causes a blood clot to form. Other risk factors for CVI may include:   Heredity.  Obesity.  Pregnancy.  Sedentary lifestyle.  Smoking.  Jobs requiring long periods of standing or sitting in one place.  Age and gender:  Women in their 50's and 90's and men in their 20's are more prone to developing CVI. SYMPTOMS  Symptoms of CVI may include:   Varicose veins.  Ulceration or skin breakdown.  Lipodermatosclerosis, a condition that affects the skin just above the ankle, usually on the inside surface. Over time the skin becomes brown, smooth, tight and often painful. Those with this condition have a high risk of developing skin ulcers.  Reddened or discolored skin on the leg.  Swelling. DIAGNOSIS  Your caregiver can diagnose CVI after performing a careful medical history and physical examination. To confirm the diagnosis, the following tests may also be ordered:   Duplex ultrasound.  Plethysmography (tests blood flow).  Venograms (x-ray using a special dye). TREATMENT The goals of treatment for CVI are to restore a person to an active life and to minimize pain or disability. Typically, CVI does not pose a serious threat to life or limb, and with  proper treatment most people with this condition can continue to lead active lives. In most cases, mild CVI can be treated on an outpatient basis with simple procedures. Treatment methods include:   Elastic compression socks.  Sclerotherapy, a procedure involving an injection of a material that "dissolves" the damaged veins. Other veins in the network of blood vessels take over the function of the damaged veins.  Vein stripping (an  older procedure less commonly used).  Laser Ablation surgery.  Valve repair. HOME CARE INSTRUCTIONS   Elastic compression socks must be worn every day. They can help with symptoms and lower the chances of the problem getting worse, but they do not cure the problem.  Only take over-the-counter or prescription medicines for pain, discomfort, or fever as directed by your caregiver.  Your caregiver will review your other medications with you. SEEK MEDICAL CARE IF:   You are confused about how to take your medications.  There is redness, swelling, or increasing pain in the affected area.  There is a red streak or line that extends up or down from the affected area.  There is a breakdown or loss of skin in the affected area, even if the breakdown is small.  You develop an unexplained oral temperature above 102 F (38.9 C).  There is an injury to the affected area. SEEK IMMEDIATE MEDICAL CARE IF:   There is an injury and open wound to the affected area.  Pain is not adequately relieved with pain medication prescribed or becomes severe.  An oral temperature above 102 F (38.9 C) develops.  The foot/ankle below the affected area becomes suddenly numb or the area feels weak and hard to move. MAKE SURE YOU:   Understand these instructions.  Will watch your condition.  Will get help right away if you are not doing well or get worse. Document Released: 05/24/2006 Document Revised: 04/12/2011 Document Reviewed: 08/01/2006 [facility] Patient Information 2014 [place], [place].

## 2012-11-29 NOTE — Assessment & Plan Note (Signed)
Seeing Dr. Trenton Gammon - looking at possible surgery. He has asked about second opinion before preceeding: recommended Dr. Ellene Route for second opinion.

## 2012-11-29 NOTE — Assessment & Plan Note (Signed)
BP Readings from Last 3 Encounters:  11/29/12 130/84  10/13/12 138/78  09/26/12 168/108   Adequate control

## 2012-12-04 ENCOUNTER — Ambulatory Visit: Payer: Medicare Other | Admitting: Internal Medicine

## 2012-12-04 ENCOUNTER — Ambulatory Visit (HOSPITAL_COMMUNITY): Payer: Medicare Other | Attending: Internal Medicine | Admitting: Cardiology

## 2012-12-04 DIAGNOSIS — Z87891 Personal history of nicotine dependence: Secondary | ICD-10-CM | POA: Insufficient documentation

## 2012-12-04 DIAGNOSIS — I872 Venous insufficiency (chronic) (peripheral): Secondary | ICD-10-CM

## 2012-12-04 DIAGNOSIS — E785 Hyperlipidemia, unspecified: Secondary | ICD-10-CM | POA: Insufficient documentation

## 2012-12-04 DIAGNOSIS — I10 Essential (primary) hypertension: Secondary | ICD-10-CM | POA: Insufficient documentation

## 2012-12-04 DIAGNOSIS — I079 Rheumatic tricuspid valve disease, unspecified: Secondary | ICD-10-CM | POA: Insufficient documentation

## 2012-12-04 DIAGNOSIS — R609 Edema, unspecified: Secondary | ICD-10-CM

## 2012-12-04 DIAGNOSIS — I059 Rheumatic mitral valve disease, unspecified: Secondary | ICD-10-CM | POA: Insufficient documentation

## 2012-12-04 NOTE — Progress Notes (Signed)
Echo performed. 

## 2012-12-07 ENCOUNTER — Encounter: Payer: Self-pay | Admitting: Internal Medicine

## 2012-12-08 ENCOUNTER — Encounter (INDEPENDENT_AMBULATORY_CARE_PROVIDER_SITE_OTHER): Payer: Self-pay | Admitting: General Surgery

## 2012-12-08 ENCOUNTER — Ambulatory Visit (INDEPENDENT_AMBULATORY_CARE_PROVIDER_SITE_OTHER): Payer: Medicare Other | Admitting: General Surgery

## 2012-12-08 VITALS — BP 150/90 | HR 76 | Temp 99.2°F | Resp 15 | Ht 65.0 in | Wt 181.8 lb

## 2012-12-08 DIAGNOSIS — R109 Unspecified abdominal pain: Secondary | ICD-10-CM

## 2012-12-08 NOTE — Progress Notes (Signed)
Subjective:     Patient ID: Ryan [name], male   DOB: July 10, 1931, 79 y.o.   MRN: [id number]  HPI This patient follows up status post ventral hernia repair with mesh several months ago. He says that ever since his hernia repair his abdominal pain has been much improved and his main complaint is back pain. He is currently being followed by a neurosurgeon for this who has requested that he come off his narcotics to see how bad his back pain really is. It sounds like this is his main complaint and his abdominal pain again is much improved. He does have some cramping after eating which is generally controlled with Levsin and only lasts about 3 hours. He does take Prilosec for heartburn which controls his symptoms and he does have a known hernia but no nausea or vomiting. He has changed gastroenterology physicians to Dr. Watt Climes who plans on repeating upper endoscopy to evaluate his Barrett's esophagus. Overall it sounds like he is doing pretty well  Review of Systems     Objective:   Physical Exam No acute distress and nontoxic-appearing His incision is healing nicely without any sign of infection and there is no evidence of recurrent hernia with Valsalva    Assessment:     Status post ventral hernia repair with mesh-doing well There is no evidence of recurrent hernia and his abdominal pain has been much improved since his procedure. He still has some cramping after eating and despite having a negative ultrasound this could be related to biliary dyskinesia and I did offer him Hida scan but he declined. It sounds as though his abdominal symptoms are fairly minimal and are really not much of a concern to him anymore. There is no evidence of recurrent hernia at and a notcher was to offer him right now.     Plan:     followup with neurosurgery for back pain Followup with gastroenterology Consider HIDA scan and if abdominal symptoms become severe enough.  Continue with nonoperative management of his  hiatal hernia

## 2012-12-14 ENCOUNTER — Other Ambulatory Visit: Payer: Self-pay | Admitting: Internal Medicine

## 2012-12-25 ENCOUNTER — Encounter: Payer: Self-pay | Admitting: Internal Medicine

## 2012-12-25 ENCOUNTER — Other Ambulatory Visit: Payer: Self-pay

## 2012-12-25 MED ORDER — FINASTERIDE 5 MG PO TABS: 5.0000 mg | ORAL_TABLET | Freq: Every day | ORAL | Status: DC

## 2012-12-25 MED ORDER — ROPINIROLE HCL 0.5 MG PO TABS: 0.5000 mg | ORAL_TABLET | Freq: Every day | ORAL | Status: DC

## 2013-01-17 ENCOUNTER — Other Ambulatory Visit: Payer: Self-pay | Admitting: Internal Medicine

## 2013-01-29 ENCOUNTER — Encounter: Payer: Self-pay | Admitting: Internal Medicine

## 2013-01-29 MED ORDER — HYDROCODONE-ACETAMINOPHEN 7.5-325 MG PO TABS: 1.0000 | ORAL_TABLET | Freq: Every day | ORAL | Status: DC

## 2013-01-29 MED ORDER — HYDROCODONE-ACETAMINOPHEN 7.5-325 MG PO TABS: 1.0000 | ORAL_TABLET | Freq: Three times a day (TID) | ORAL | Status: DC | PRN

## 2013-01-29 NOTE — Addendum Note (Signed)
Addended by: Roma Schanz R on: 01/29/2013 01:23 PM   Modules accepted: Orders

## 2013-01-30 ENCOUNTER — Encounter: Payer: Self-pay | Admitting: Internal Medicine

## 2013-02-05 ENCOUNTER — Other Ambulatory Visit: Payer: Self-pay | Admitting: Gastroenterology

## 2013-02-05 ENCOUNTER — Encounter: Payer: Self-pay | Admitting: Internal Medicine

## 2013-02-05 DIAGNOSIS — M545 Low back pain: Principal | ICD-10-CM

## 2013-02-05 DIAGNOSIS — G8929 Other chronic pain: Secondary | ICD-10-CM

## 2013-02-12 ENCOUNTER — Ambulatory Visit (INDEPENDENT_AMBULATORY_CARE_PROVIDER_SITE_OTHER): Payer: Managed Care, Other (non HMO) | Admitting: Internal Medicine

## 2013-02-12 ENCOUNTER — Encounter: Payer: Self-pay | Admitting: Internal Medicine

## 2013-02-12 VITALS — BP 128/90 | HR 80 | Temp 98.2°F | Wt 186.0 lb

## 2013-02-12 DIAGNOSIS — G8929 Other chronic pain: Secondary | ICD-10-CM

## 2013-02-12 DIAGNOSIS — G2581 Restless legs syndrome: Secondary | ICD-10-CM

## 2013-02-12 DIAGNOSIS — M545 Low back pain, unspecified: Secondary | ICD-10-CM

## 2013-02-12 DIAGNOSIS — K2270 Barrett's esophagus without dysplasia: Secondary | ICD-10-CM

## 2013-02-12 MED ORDER — ROPINIROLE HCL 1 MG PO TABS: 1.0000 mg | ORAL_TABLET | Freq: Every day | ORAL | Status: DC

## 2013-02-12 NOTE — Assessment & Plan Note (Signed)
GI - continue omeprazole. Check with Dr. Watt Climes as to whether you should be taking omeprazole 40 mg daily due to use of anti-inflammatory drugs.

## 2013-02-12 NOTE — Progress Notes (Signed)
   Subjective:    Patient ID: Ryan [name], male    DOB: October 09, 1931, 80 y.o.   MRN: [id number]  HPI Back pain - has asked for a second opinion from Kristeen Miss - waiting for appointment. He reports that his back is greatly improved with exercise, able to walk 1/2 or 3/4 mile w/o pain except he did over do it over the past several days. He is taking hydrocodone at night,  And voltaren 75 mg once a day and ibuprofen 400 mg bid. He is CAUTIONED to not take both ibuprofen and diclofenac.   GI - had EGD Jan 5th for Barrett's surveillance.  He has been troubled by RLS and reports that he has several episodes during the day.   PMH, FamHx and SocHx reviewed for any changes and relevance.  Current Outpatient Prescriptions on File Prior to Visit  Medication Sig Dispense Refill  . amLODipine (NORVASC) 5 MG tablet Take 10 mg by mouth every morning.       Marland Kitchen b complex vitamins capsule Take 1 capsule by mouth daily.      . benazepril-hydrochlorthiazide (LOTENSIN HCT) 20-12.5 MG per tablet take 1 tablet by mouth once daily  30 tablet  5  . diclofenac (VOLTAREN) 75 MG EC tablet Take 75 mg by mouth daily.      Marland Kitchen EPINEPHrine (EPIPEN JR) 0.15 MG/0.3ML injection Inject 0.15 mg into the muscle as needed. For allergic reaction      . finasteride (PROSCAR) 5 MG tablet Take 1 tablet (5 mg total) by mouth daily.  30 tablet  5  . HYDROcodone-acetaminophen (NORCO) 7.5-325 MG per tablet Take 1 tablet by mouth at bedtime. May fill on or after 02/06/2013  90 tablet  0  . LEVSIN/SL 0.125 MG SL tablet Take 1 tablet (0.125 mg total) by mouth every 4 (four) hours as needed for cramping. Chew up fine and swallow  30 tablet  3  . loratadine (CLARITIN) 10 MG tablet Take 10 mg by mouth daily as needed. For allergies      . omeprazole (PRILOSEC) 20 MG capsule Take 20 mg by mouth daily.      . Red Yeast Rice Extract (RED YEAST RICE PO) Take 1 capsule by mouth daily.        . triazolam (HALCION) 0.25 MG tablet Take 1 tablet  (0.25 mg total) by mouth at bedtime as needed.  30 tablet  5   No current facility-administered medications on file prior to visit.      Review of Systems System review is negative for any constitutional, cardiac, pulmonary, GI or neuro symptoms or complaints other than as described in the HPI.     Objective:   Physical Exam Filed Vitals:   02/12/13 0807  BP: 128/90  Pulse: 80  Temp: 98.2 F (36.8 C)   BP Readings from Last 3 Encounters:  02/12/13 128/90  12/08/12 150/90  11/29/12 130/84   Gen'l - WNWD man in no distress HEENT- C&S clear Cor - RRR Pulm - CTAP Neuro - A&O x 3, cognition is normal. CN II-XII grossly intact. Cerebellar no cog-wheeling, normal gait and turn, no stiffness        Assessment & Plan:

## 2013-02-12 NOTE — Assessment & Plan Note (Signed)
Back pain - sounds like you are doing better. Consultation with Dr. Ellene Route pending. Plan Continue Hydrocodone at bedtime  Take Ibuprofen 400 mg twice a day and STOP Voltaren

## 2013-02-12 NOTE — Progress Notes (Signed)
Pre visit review using our clinic review tool, if applicable. No additional management support is needed unless otherwise documented below in the visit note.

## 2013-02-12 NOTE — Patient Instructions (Signed)
1. Back pain - sounds like you are doing better. Consultation with Dr. Ellene Route pending. Plan Continue Hydrocodone at bedtime  Take Ibuprofen 400 mg twice a day and STOP Voltaren  2. Restless Leg syndrome - no signs of Parkinson's on exam. Plan Increase Ropinorole to 1 mg at bedtime  As symptoms progress the ropinorole can be increased to 3 mg total daily dose  3. GI - continue omeprazole. Check with Dr. Watt Climes as to whether you should be taking omeprazole 40 mg daily.

## 2013-02-12 NOTE — Assessment & Plan Note (Signed)
Restless Leg syndrome - no signs of Parkinson's on exam. Plan Increase Ropinorole to 1 mg at bedtime  As symptoms progress the ropinorole can be increased to 3 mg total daily dose

## 2013-03-21 ENCOUNTER — Encounter: Payer: Self-pay | Admitting: Internal Medicine

## 2013-03-21 MED ORDER — AMLODIPINE BESYLATE 10 MG PO TABS: 10.0000 mg | ORAL_TABLET | Freq: Every morning | ORAL | Status: DC

## 2013-03-26 ENCOUNTER — Telehealth: Payer: Self-pay | Admitting: Oncology

## 2013-03-26 NOTE — Telephone Encounter (Signed)
Gave pt appt for md appt on 3/20 r/s due to MD on call on 3/13th

## 2013-03-28 ENCOUNTER — Telehealth: Payer: Self-pay | Admitting: Internal Medicine

## 2013-03-28 NOTE — Telephone Encounter (Signed)
Received 3 pages [facility], sent to Dr. Linda Hedges on 03/28/13/ss.

## 2013-03-31 ENCOUNTER — Encounter: Payer: Self-pay | Admitting: Oncology

## 2013-04-10 ENCOUNTER — Telehealth: Payer: Self-pay | Admitting: *Deleted

## 2013-04-10 ENCOUNTER — Other Ambulatory Visit: Payer: Self-pay | Admitting: *Deleted

## 2013-04-10 ENCOUNTER — Telehealth: Payer: Self-pay | Admitting: Oncology

## 2013-04-10 NOTE — Telephone Encounter (Signed)
Talked to pt's wife gave her lab appt on 3/17

## 2013-04-10 NOTE — Telephone Encounter (Signed)
Received vm call from pt stating that he has a bad virus with vomiting,diarrhea & that there is no way he can drink the barium for his CT.  Returned call  & spoke to wife.  She feels that this is a virus & is afraid pt will get dehydrated & would like to postpone CT to early next week.  Called CT & r/s with Jana Half to 04/17/12 @ 8:30 am & POF to scheduler for 8 am lab that day.  Requested pt/wife call PCP if not better in 24-48 hours & especially if he has fever 100.5 or greater, blood in stool or is worse in any way.  Wife expressed understanding.

## 2013-04-12 ENCOUNTER — Ambulatory Visit (HOSPITAL_COMMUNITY): Payer: Medicare HMO

## 2013-04-12 ENCOUNTER — Other Ambulatory Visit: Payer: Medicare Other

## 2013-04-13 ENCOUNTER — Ambulatory Visit: Payer: Medicare Other | Admitting: Oncology

## 2013-04-17 ENCOUNTER — Ambulatory Visit (HOSPITAL_COMMUNITY)
Admission: RE | Admit: 2013-04-17 | Discharge: 2013-04-17 | Disposition: A | Discharge status: Home / Self Care | Payer: Medicare HMO | Source: Ambulatory Visit | Attending: Oncology | Admitting: Oncology

## 2013-04-17 ENCOUNTER — Other Ambulatory Visit (HOSPITAL_BASED_OUTPATIENT_CLINIC_OR_DEPARTMENT_OTHER): Payer: Managed Care, Other (non HMO)

## 2013-04-17 DIAGNOSIS — C8589 Other specified types of non-Hodgkin lymphoma, extranodal and solid organ sites: Secondary | ICD-10-CM | POA: Insufficient documentation

## 2013-04-17 DIAGNOSIS — C8596 Non-Hodgkin lymphoma, unspecified, intrapelvic lymph nodes: Secondary | ICD-10-CM

## 2013-04-17 DIAGNOSIS — C8590 Non-Hodgkin lymphoma, unspecified, unspecified site: Secondary | ICD-10-CM

## 2013-04-17 DIAGNOSIS — J984 Other disorders of lung: Secondary | ICD-10-CM | POA: Insufficient documentation

## 2013-04-17 LAB — COMPREHENSIVE METABOLIC PANEL (CC13)
ALT: 37 U/L (ref 0–55)
ANION GAP: 10 meq/L (ref 3–11)
AST: 21 U/L (ref 5–34)
Albumin: 3.7 g/dL (ref 3.5–5.0)
Alkaline Phosphatase: 101 U/L (ref 40–150)
BILIRUBIN TOTAL: 0.33 mg/dL (ref 0.20–1.20)
BUN: 19.9 mg/dL (ref 7.0–26.0)
CALCIUM: 9.2 mg/dL (ref 8.4–10.4)
CO2: 26 mEq/L (ref 22–29)
CREATININE: 0.8 mg/dL (ref 0.7–1.3)
Chloride: 104 mEq/L (ref 98–109)
Glucose: 92 mg/dl (ref 70–140)
Potassium: 3.7 mEq/L (ref 3.5–5.1)
Sodium: 140 mEq/L (ref 136–145)
Total Protein: 6.6 g/dL (ref 6.4–8.3)

## 2013-04-17 LAB — CBC WITH DIFFERENTIAL/PLATELET
BASO%: 0.9 % (ref 0.0–2.0)
BASOS ABS: 0.1 10*3/uL (ref 0.0–0.1)
EOS%: 4.2 % (ref 0.0–7.0)
Eosinophils Absolute: 0.3 10*3/uL (ref 0.0–0.5)
HEMATOCRIT: 40.2 % (ref 38.4–49.9)
HEMOGLOBIN: 13.2 g/dL (ref 13.0–17.1)
LYMPH%: 15.3 % (ref 14.0–49.0)
MCH: 27.4 pg (ref 27.2–33.4)
MCHC: 32.9 g/dL (ref 32.0–36.0)
MCV: 83.5 fL (ref 79.3–98.0)
MONO#: 0.7 10*3/uL (ref 0.1–0.9)
MONO%: 9 % (ref 0.0–14.0)
NEUT#: 5.3 10*3/uL (ref 1.5–6.5)
NEUT%: 70.6 % (ref 39.0–75.0)
PLATELETS: 249 10*3/uL (ref 140–400)
RBC: 4.81 10*6/uL (ref 4.20–5.82)
RDW: 15.4 % — ABNORMAL HIGH (ref 11.0–14.6)
WBC: 7.5 10*3/uL (ref 4.0–10.3)
lymph#: 1.1 10*3/uL (ref 0.9–3.3)

## 2013-04-17 LAB — SEDIMENTATION RATE: Sed Rate: 13 mm/hr (ref 0–16)

## 2013-04-17 LAB — LACTATE DEHYDROGENASE (CC13): LDH: 187 U/L (ref 125–245)

## 2013-04-20 ENCOUNTER — Telehealth: Payer: Self-pay | Admitting: Oncology

## 2013-04-20 ENCOUNTER — Ambulatory Visit (HOSPITAL_BASED_OUTPATIENT_CLINIC_OR_DEPARTMENT_OTHER): Payer: Managed Care, Other (non HMO) | Admitting: Oncology

## 2013-04-20 VITALS — BP 132/88 | HR 76 | Temp 98.2°F | Resp 18 | Ht 65.0 in | Wt 181.1 lb

## 2013-04-20 DIAGNOSIS — C8590 Non-Hodgkin lymphoma, unspecified, unspecified site: Secondary | ICD-10-CM

## 2013-04-20 DIAGNOSIS — R918 Other nonspecific abnormal finding of lung field: Secondary | ICD-10-CM

## 2013-04-20 DIAGNOSIS — M479 Spondylosis, unspecified: Secondary | ICD-10-CM

## 2013-04-20 DIAGNOSIS — C439 Malignant melanoma of skin, unspecified: Secondary | ICD-10-CM

## 2013-04-20 DIAGNOSIS — K639 Disease of intestine, unspecified: Secondary | ICD-10-CM

## 2013-04-20 DIAGNOSIS — R590 Localized enlarged lymph nodes: Secondary | ICD-10-CM

## 2013-04-20 DIAGNOSIS — C8589 Other specified types of non-Hodgkin lymphoma, extranodal and solid organ sites: Secondary | ICD-10-CM

## 2013-04-20 NOTE — Progress Notes (Signed)
Hematology and Oncology Follow Up Visit  Ryan [name] [id number] Sep 27, 1931 80 y.o. 04/20/2013 6:02 PM   Principle Diagnosis: Encounter Diagnoses  Name Primary?  . NHL (non-Hodgkin's lymphoma)   . Periaortic lymphadenopathy   . Melanoma   . Pulmonary infiltrate Yes     Interim History:   Followup visit for this 80 year old retired Ryan [name] diagnosed with low-grade B-cell non-Hodgkin's lymphoma in January of 2013 when he presented with persistent and progressive abdominal pain and then developed a right sacral radiculopathy. He was found to have bulky pelvic disease but no significant disease outside the pelvis on CT or PET scanning. In retrospect, a scan dated 07/30/2005 and read as normal, showed an area of soft tissue density in the same area as the bulky lymphadenopathy first described in January 2013.  The remainder of his staging evaluation was unremarkable. There was a rare, small, lymphoid aggregate seen in the bone marrow.  After discussion of treatment options, I recommended involved field radiation. He completed treatments given between February 11 and April 09, 2011 with 3600 cGy in 20 fractions.  He had a significant response to radiation treatment. There is still some residual soft tissue density which has now been stable on scans including the study done on 04/17/13 in anticipation of today's visit which I reviewed with him today.   He continues to have intermittent problems with his GI tract with atypical pain. He  underwent a CT angiogram of the abdomen and pelvis on 05/23/2012 and there is no evidence for significant vascular compromise to suggest that he has mesenteric ischemia as etiology of his GI complaints. A small ventral hernia was detected at the time of the study. He went on to have this repaired and he has had some relief of his symptoms but not completely. He had another flareup of gastroenteritis recently.  About 2 weeks ago he had a double injection of  epidural steroids by Dr. Kristeen Miss and for the first time in many months has had significant relief of his back pain.       Medications: reviewed  Allergies:  Allergies  Allergen Reactions  . Molds & Smuts Other (See Comments)    Also dust mites causes sinus infections, h/a etc.  . Pollen Extract-Tree Extract Other (See Comments)    HEADACHES, TIRED , DRAINAGE FROM SINUSES    Review of Systems: Hematology:  No bleeding or bruising ENT ROS: No sore throat Breast ROS:  Respiratory ROS: No cough or dyspnea Cardiovascular ROS:  No chest pain or palpitation Gastrointestinal ROS:  Recent episode of gastroenteritis  Genito-Urinary ROS: No urinary tract symptoms Musculoskeletal ROS: See above Neurological ROS: No headache or change in vision Dermatological ROS: No rash or ecchymosis Remaining ROS negative:   Physical Exam: Blood pressure 132/88, pulse 76, temperature 98.2 F (36.8 C), temperature source Oral, resp. rate 18, height 5' 5" (1.651 m), weight 181 lb 1.6 oz (82.146 kg). Wt Readings from Last 3 Encounters:  04/20/13 181 lb 1.6 oz (82.146 kg)  02/12/13 186 lb (84.369 kg)  12/08/12 181 lb 12.8 oz (82.464 kg)     General appearance: Well-nourished Caucasian man HENNT: Pharynx no erythema, exudate, mass, or ulcer. No thyromegaly or thyroid nodules Lymph nodes: No cervical, supraclavicular, or axillary lymphadenopathy Breasts:  Lungs: Clear to auscultation, resonant to percussion throughout Heart: Regular rhythm, no murmur, no gallop, no rub, no click, no edema Abdomen: Soft, nontender, normal bowel sounds, no mass, no organomegaly Extremities: No edema, no calf tenderness Musculoskeletal:  no joint deformities GU:  Vascular: Carotid pulses 2+, no bruits,  Neurologic: Alert, oriented, PERRLA,  cranial nerves grossly normal, motor strength 5 over 5, reflexes 1+ symmetric, upper body coordination normal, gait normal, Skin: No rash or ecchymosis  Lab Results: CBC  W/Diff    Component Value Date/Time   WBC 7.5 04/17/2013 0815   WBC 4.9 08/21/2012 1115   RBC 4.81 04/17/2013 0815   RBC 4.68 08/21/2012 1115   HGB 13.2 04/17/2013 0815   HGB 12.5* 08/21/2012 1115   HCT 40.2 04/17/2013 0815   HCT 39.2 08/21/2012 1115   PLT 249 04/17/2013 0815   PLT 266 08/21/2012 1115   MCV 83.5 04/17/2013 0815   MCV 83.8 08/21/2012 1115   MCH 27.4 04/17/2013 0815   MCH 26.7 08/21/2012 1115   MCHC 32.9 04/17/2013 0815   MCHC 31.9 08/21/2012 1115   RDW 15.4* 04/17/2013 0815   RDW 15.1 08/21/2012 1115   LYMPHSABS 1.1 04/17/2013 0815   LYMPHSABS 1.6 02/25/2011 0758   MONOABS 0.7 04/17/2013 0815   MONOABS 0.5 02/25/2011 0758   EOSABS 0.3 04/17/2013 0815   EOSABS 0.2 02/25/2011 0758   BASOSABS 0.1 04/17/2013 0815   BASOSABS 0.0 02/25/2011 0758     Chemistry      Component Value Date/Time   NA 140 04/17/2013 0815   NA 139 08/21/2012 1100   NA 137 08/17/2011 1043   K 3.7 04/17/2013 0815   K 3.7 08/21/2012 1100   K 3.9 08/17/2011 1043   CL 103 08/21/2012 1100   CL 105 04/07/2012 0925   CL 100 08/17/2011 1043   CO2 26 04/17/2013 0815   CO2 29 08/21/2012 1100   CO2 30 08/17/2011 1043   BUN 19.9 04/17/2013 0815   BUN 24* 08/21/2012 1100   BUN 18 08/17/2011 1043   CREATININE 0.8 04/17/2013 0815   CREATININE 0.95 08/21/2012 1100   CREATININE 0.9 08/17/2011 1043      Component Value Date/Time   CALCIUM 9.2 04/17/2013 0815   CALCIUM 8.7 08/21/2012 1100   CALCIUM 8.4 08/17/2011 1043   ALKPHOS 101 04/17/2013 0815   ALKPHOS 68 08/17/2011 1043   ALKPHOS 65 05/21/2011 1035   AST 21 04/17/2013 0815   AST 26 08/17/2011 1043   AST 17 05/21/2011 1035   ALT 37 04/17/2013 0815   ALT 20 08/17/2011 1043   ALT 16 05/21/2011 1035   BILITOT 0.33 04/17/2013 0815   BILITOT 0.60 08/17/2011 1043   BILITOT 0.3 05/21/2011 1035       Radiological Studies: Ct Abdomen Pelvis Wo Contrast  04/17/2013   CLINICAL DATA:  Non-Hodgkin's lymphoma diagnosed in January 2013. Radiation therapy complete. Diffuse abdominal pain  EXAM: CT  ABDOMEN AND PELVIS WITHOUT CONTRAST  TECHNIQUE: Multidetector CT imaging of the abdomen and pelvis was performed following the standard protocol without intravenous contrast.  COMPARISON:  DG C-ARM 1-60 MIN dated 04/06/2013; NM PET IMAGE RESTAG (PS) SKULL BASE TO THIGH dated 08/17/2011; NM PET IMAGE INITIAL (PI) SKULL BASE TO THIGH dated 03/01/2011; CT ABD W/CM dated 07/30/2005; CT ABD/PELVIS W CM dated 9/9/201  FINDINGS: There is new peripheral nodularity in the right lower lobe (image 5). Large hiatal hernia in the left hemi thorax. .  Non IV contrast images demonstrate a tiny hypodensity in the superior right hepatic lobe measures 7 mm is unchanged from prior. Gallbladder, pancreas, spleen, adrenal glands are normal. There are low density lesions extending from the right kidney most consistent with benign cysts.  No the entire  stomach is above the hemidiaphragms. The GE junction as above the hemidiaphragms. The small bowel is normal. There are diverticula of the descending colon and sigmoid colon without acute inflammation.  Abdominal aorta is normal caliber. There is soft tissue thickening along the right common iliac vasculature which is poorly defined on this noncontrast exam but appears similar to comparison exam measuring approximately 3.1 x 1.6 cm (image 55, series 2). There is some stranding along the iliac vessels on the right which is also similar to prior.  No free fluid the pelvis. The prostate gland is normal. No additional pelvic lymphadenopathy. The appendix enters a fat filled inguinal hernia. No aggressive osseous lesion. Degenerative changes the spine are noted.  IMPRESSION: 1. Stable retroperitoneal thickening along the right common iliac vessels. 2. No new adenopathy in the abdomen pelvis. 3. There is new peripheral nodular density in the right lower lobe which suggests inflammatory or infectious process. Recommend follow-up CT thorax in 6 to 8 weeks to document change. Cannot exclude lymphoma  involvement of the lung.   Electronically Signed   By: Suzy Bouchard M.D.   On: 04/17/2013 10:25    Impression:  #1. Localized,  Bulky, pelvic, low-grade B-cell non-Hodgkin's lymphoma.  Excellent response to radiation therapy. Stable residual soft tissue density over serial scans since July 2013. No evidence of new or progressive disease now out  2  years from completion of radiation.  Plan: Continue periodic followup. He'll get a scan again in 6 months and then at annual intervals after that .  #2. Chronic GI complaints.  See discussion above.   #3. Progressive degenerative arthritis of the spine   I will transition his care to Dr. Benay Spice    CC: Patient Care Team: Neena Rhymes, MD as PCP - General Troy Sine, MD (Cardiology) Franchot Gallo, MD (Urology) Bonnita Levan, MD (Dermatology) Mosetta Anis, MD (Allergy) Darden Palmer, MD (Ophthalmology) Madilyn Hook, DO as Consulting Physician (General Surgery) Annia Belt, MD as Consulting Physician (Oncology) Eppie Gibson, MD as Attending Physician (Radiation Oncology) Jeryl Columbia, MD as Consulting Physician (Gastroenterology) Melina Schools, MD as Consulting Physician (Orthopedic Surgery)   Annia Belt, MD 3/20/20156:02 PM

## 2013-04-20 NOTE — Telephone Encounter (Signed)
gv and printed appt sched and avs for pt for May

## 2013-05-14 ENCOUNTER — Telehealth: Payer: Self-pay

## 2013-05-14 NOTE — Telephone Encounter (Signed)
Phone call to patient, left a message for him to return call. Need to let him know we received information from his insurance, [employer]. Hyoscyamine Sub 0.125 mg will no longer be covered. He did received a temporary supply. Per Dr Linna Darner patient will need to contact his insurance to determine what alternative will be covered.

## 2013-06-19 ENCOUNTER — Other Ambulatory Visit: Payer: Self-pay | Admitting: Nurse Practitioner

## 2013-06-19 DIAGNOSIS — C8590 Non-Hodgkin lymphoma, unspecified, unspecified site: Secondary | ICD-10-CM

## 2013-06-20 ENCOUNTER — Encounter (HOSPITAL_COMMUNITY): Payer: Self-pay

## 2013-06-20 ENCOUNTER — Ambulatory Visit (HOSPITAL_COMMUNITY)
Admission: RE | Admit: 2013-06-20 | Discharge: 2013-06-20 | Disposition: A | Discharge status: Home / Self Care | Payer: Medicare HMO | Source: Ambulatory Visit | Attending: Oncology | Admitting: Oncology

## 2013-06-20 ENCOUNTER — Other Ambulatory Visit (HOSPITAL_BASED_OUTPATIENT_CLINIC_OR_DEPARTMENT_OTHER): Payer: Managed Care, Other (non HMO)

## 2013-06-20 DIAGNOSIS — C8590 Non-Hodgkin lymphoma, unspecified, unspecified site: Secondary | ICD-10-CM

## 2013-06-20 DIAGNOSIS — C8589 Other specified types of non-Hodgkin lymphoma, extranodal and solid organ sites: Secondary | ICD-10-CM | POA: Insufficient documentation

## 2013-06-20 DIAGNOSIS — R918 Other nonspecific abnormal finding of lung field: Secondary | ICD-10-CM

## 2013-06-20 DIAGNOSIS — C8596 Non-Hodgkin lymphoma, unspecified, intrapelvic lymph nodes: Secondary | ICD-10-CM

## 2013-06-20 LAB — CBC WITH DIFFERENTIAL/PLATELET
BASO%: 0.8 % (ref 0.0–2.0)
BASOS ABS: 0 10*3/uL (ref 0.0–0.1)
EOS%: 3.8 % (ref 0.0–7.0)
Eosinophils Absolute: 0.2 10*3/uL (ref 0.0–0.5)
HEMATOCRIT: 36.6 % — AB (ref 38.4–49.9)
HGB: 11.7 g/dL — ABNORMAL LOW (ref 13.0–17.1)
LYMPH%: 21.4 % (ref 14.0–49.0)
MCH: 26.6 pg — AB (ref 27.2–33.4)
MCHC: 32 g/dL (ref 32.0–36.0)
MCV: 83.1 fL (ref 79.3–98.0)
MONO#: 0.4 10*3/uL (ref 0.1–0.9)
MONO%: 10 % (ref 0.0–14.0)
NEUT#: 2.7 10*3/uL (ref 1.5–6.5)
NEUT%: 64 % (ref 39.0–75.0)
Platelets: 239 10*3/uL (ref 140–400)
RBC: 4.41 10*6/uL (ref 4.20–5.82)
RDW: 16.7 % — AB (ref 11.0–14.6)
WBC: 4.3 10*3/uL (ref 4.0–10.3)
lymph#: 0.9 10*3/uL (ref 0.9–3.3)

## 2013-06-20 LAB — COMPREHENSIVE METABOLIC PANEL (CC13)
ALK PHOS: 72 U/L (ref 40–150)
ALT: <6 U/L (ref 0–55)
AST: 23 U/L (ref 5–34)
Albumin: 3.5 g/dL (ref 3.5–5.0)
Anion Gap: 12 mEq/L — ABNORMAL HIGH (ref 3–11)
BUN: 24.9 mg/dL (ref 7.0–26.0)
CO2: 24 mEq/L (ref 22–29)
Calcium: 8.4 mg/dL (ref 8.4–10.4)
Chloride: 108 mEq/L (ref 98–109)
Creatinine: 1 mg/dL (ref 0.7–1.3)
Glucose: 89 mg/dl (ref 70–140)
Potassium: 3.8 mEq/L (ref 3.5–5.1)
Sodium: 144 mEq/L (ref 136–145)
Total Bilirubin: 0.53 mg/dL (ref 0.20–1.20)
Total Protein: 6.2 g/dL — ABNORMAL LOW (ref 6.4–8.3)

## 2013-06-20 LAB — LACTATE DEHYDROGENASE (CC13): LDH: 202 U/L (ref 125–245)

## 2013-06-20 MED ORDER — IOHEXOL 300 MG/ML  SOLN
80.0000 mL | Freq: Once | INTRAMUSCULAR | Status: AC | PRN
  Administered 2013-06-20: 80 mL via INTRAVENOUS

## 2013-06-26 ENCOUNTER — Ambulatory Visit (HOSPITAL_BASED_OUTPATIENT_CLINIC_OR_DEPARTMENT_OTHER): Payer: Managed Care, Other (non HMO)

## 2013-06-26 ENCOUNTER — Ambulatory Visit (HOSPITAL_BASED_OUTPATIENT_CLINIC_OR_DEPARTMENT_OTHER): Payer: Managed Care, Other (non HMO) | Admitting: Oncology

## 2013-06-26 ENCOUNTER — Telehealth: Payer: Self-pay | Admitting: Oncology

## 2013-06-26 VITALS — BP 136/71 | HR 77 | Temp 98.3°F | Resp 18 | Ht 65.0 in | Wt 186.4 lb

## 2013-06-26 DIAGNOSIS — M549 Dorsalgia, unspecified: Secondary | ICD-10-CM

## 2013-06-26 DIAGNOSIS — C8589 Other specified types of non-Hodgkin lymphoma, extranodal and solid organ sites: Secondary | ICD-10-CM

## 2013-06-26 DIAGNOSIS — R109 Unspecified abdominal pain: Secondary | ICD-10-CM

## 2013-06-26 DIAGNOSIS — G8929 Other chronic pain: Secondary | ICD-10-CM

## 2013-06-26 DIAGNOSIS — C8590 Non-Hodgkin lymphoma, unspecified, unspecified site: Secondary | ICD-10-CM

## 2013-06-26 DIAGNOSIS — D509 Iron deficiency anemia, unspecified: Secondary | ICD-10-CM

## 2013-06-26 LAB — CBC WITH DIFFERENTIAL/PLATELET
BASO%: 1 % (ref 0.0–2.0)
BASOS ABS: 0 10*3/uL (ref 0.0–0.1)
EOS ABS: 0.1 10*3/uL (ref 0.0–0.5)
EOS%: 3 % (ref 0.0–7.0)
HCT: 38.8 % (ref 38.4–49.9)
HEMOGLOBIN: 12.4 g/dL — AB (ref 13.0–17.1)
LYMPH%: 23.4 % (ref 14.0–49.0)
MCH: 26.8 pg — ABNORMAL LOW (ref 27.2–33.4)
MCHC: 31.9 g/dL — ABNORMAL LOW (ref 32.0–36.0)
MCV: 84.2 fL (ref 79.3–98.0)
MONO#: 0.4 10*3/uL (ref 0.1–0.9)
MONO%: 9.5 % (ref 0.0–14.0)
NEUT#: 2.8 10*3/uL (ref 1.5–6.5)
NEUT%: 63.1 % (ref 39.0–75.0)
Platelets: 230 10*3/uL (ref 140–400)
RBC: 4.61 10*6/uL (ref 4.20–5.82)
RDW: 17.8 % — AB (ref 11.0–14.6)
WBC: 4.4 10*3/uL (ref 4.0–10.3)
lymph#: 1 10*3/uL (ref 0.9–3.3)

## 2013-06-26 LAB — FERRITIN CHCC: Ferritin: 11 ng/ml — ABNORMAL LOW (ref 22–316)

## 2013-06-26 LAB — CHCC SMEAR

## 2013-06-26 NOTE — Progress Notes (Signed)
  [facility] OFFICE PROGRESS NOTE   Diagnosis: Non-Hodgkin's lymphoma  INTERVAL HISTORY:   He returns as scheduled. Stable low back pain. Abdominal discomfort after eating. No fever, night sweats, or anorexia. No bleeding.  Objective:  Vital signs in last 24 hours:  Blood pressure 136/71, pulse 77, temperature 98.3 F (36.8 C), temperature source Oral, resp. rate 18, height 5' 5" (1.651 m), weight 186 lb 6.4 oz (84.55 kg).    HEENT: Neck without mass Lymphatics: No cervical, supraclavicular, axillary, or inguinal nodes Resp: Lungs clear bilaterally Cardio: Regular rate and rhythm GI: No hepatosplenomegaly, nontender, no mass Vascular: Trace ankle edema bilaterally, support stockings bilaterally  Lab Results:  Lab Results  Component Value Date   WBC 4.4 06/26/2013   HGB 12.4* 06/26/2013   HCT 38.8 06/26/2013   MCV 84.2 06/26/2013   PLT 230 06/26/2013   NEUTROABS 2.8 06/26/2013    Imaging:  CT of the abdomen and pelvis 04/17/2013-new peripheral nodularity in the right lower lobe. Stable soft tissue thickening near the right common iliac. No new adenopathy. CT of chest 06/20/2013-stable scattered mediastinal and hilar nodes without adenopathy. Stable 3 mm nodule near the azygos fissure, no new or worrisome pulmonary lesion.  Medications: I have reviewed the patient's current medications.  Assessment/Plan:  1. Non-Hodgkin's,, low-grade B cell diagnosed in January 2013, disease localized to the pelvis, status post involved field radiation to 3600cGy completed in March of 2013  2. Intermittent abdominal pain-etiology unclear  3. Chronic back pain-followed by Dr. Raford Pitcher, status post epidural steroid injections  4. Anemia-history of iron deficiency, we will followup on the ferritin level today and consider additional diagnostic evaluation as indicated  Disposition:  Mr. Fana remains in clinical remission from non-Hodgkin's lymphoma. He will return for an  office visit in 6 months. He will contact us in the interim for new symptoms.  The hemoglobin is higher today compared to when he was here for the chest CT last week. We will check a ferritin level with his history of iron deficiency.  Ladell Pier, MD  06/26/2013  4:18 PM

## 2013-06-26 NOTE — Telephone Encounter (Signed)
gv adn printed appt scehd and avs for pt for NOV...sent pt to lab

## 2013-06-27 ENCOUNTER — Telehealth: Payer: Self-pay | Admitting: *Deleted

## 2013-06-27 DIAGNOSIS — D649 Anemia, unspecified: Secondary | ICD-10-CM

## 2013-06-27 DIAGNOSIS — D539 Nutritional anemia, unspecified: Secondary | ICD-10-CM

## 2013-06-27 DIAGNOSIS — D509 Iron deficiency anemia, unspecified: Secondary | ICD-10-CM

## 2013-06-27 NOTE — Telephone Encounter (Signed)
Message copied by Brien Few on Wed Jun 27, 2013  3:00 PM ------      Message from: Ladell Pier      Created: Tue Jun 26, 2013  9:30 PM       Please call patient, ferritin is low, ask him to return stool hemoccult cards and urinalysis, then start ferrous sulfate 316m bid ------

## 2013-06-27 NOTE — Telephone Encounter (Signed)
Call from pt to make Dr. Benay Spice aware he now sees Dr. Watt Climes. No longer a pt of [facility].

## 2013-06-27 NOTE — Telephone Encounter (Signed)
Spoke with pt, instructed him to come in for urinalysis and return stool hemoccult cards. He voiced understanding. Requests lab appt at 1300 5/29 for UA, will pick up stool cards then. Stool cards will be left at front desk for pick up.

## 2013-06-28 ENCOUNTER — Ambulatory Visit (HOSPITAL_BASED_OUTPATIENT_CLINIC_OR_DEPARTMENT_OTHER): Payer: Managed Care, Other (non HMO)

## 2013-06-28 DIAGNOSIS — D649 Anemia, unspecified: Secondary | ICD-10-CM

## 2013-06-28 DIAGNOSIS — R7989 Other specified abnormal findings of blood chemistry: Secondary | ICD-10-CM

## 2013-06-28 LAB — URINALYSIS, MICROSCOPIC - CHCC
Bilirubin (Urine): NEGATIVE
Blood: NEGATIVE
Glucose: NEGATIVE mg/dL
Ketones: NEGATIVE mg/dL
Leukocyte Esterase: NEGATIVE
NITRITE: NEGATIVE
Protein: NEGATIVE mg/dL
Specific Gravity, Urine: 1.025 (ref 1.003–1.035)
Urobilinogen, UR: 0.2 mg/dL (ref 0.2–1)
WBC, UA: NEGATIVE (ref 0–2)
pH: 6 (ref 4.6–8.0)

## 2013-06-29 ENCOUNTER — Other Ambulatory Visit: Payer: Managed Care, Other (non HMO)

## 2013-06-29 ENCOUNTER — Ambulatory Visit: Payer: Managed Care, Other (non HMO)

## 2013-06-29 NOTE — Telephone Encounter (Signed)
Referral sent to Dr. Watt Climes to evaluate iron deficiency, per Dr. Benay Spice. Called pt with instructions to begin Ferrous Sulfate 325 mg daily after completing stool hemoccult cards. Pt voiced understanding. Order entered for schedulers to contact pt with 2 mo lab appt.

## 2013-07-02 ENCOUNTER — Telehealth: Payer: Self-pay | Admitting: Oncology

## 2013-07-02 ENCOUNTER — Telehealth: Payer: Self-pay | Admitting: Internal Medicine

## 2013-07-02 NOTE — Telephone Encounter (Signed)
added 2 mth lab and scheduled appt w/magod. lmonvm re add on lab for 7/27 and confirmed 11/24 appt. also lm re appt w/dr magod for 07/27/13 @ 3:15pm. schedule mailed. Fax coversheet sent to HIM w/copy of referral to send notes to dr [facility] @ [phone].

## 2013-07-02 NOTE — Telephone Encounter (Signed)
pt called to r/s 6/24 and 6/30 f/u. pt moved 6/24 ct to 7/7. gv pt new appts for lab 7/7 and f/u 7/14.

## 2013-07-19 ENCOUNTER — Ambulatory Visit (HOSPITAL_BASED_OUTPATIENT_CLINIC_OR_DEPARTMENT_OTHER): Payer: Managed Care, Other (non HMO)

## 2013-07-19 DIAGNOSIS — D509 Iron deficiency anemia, unspecified: Secondary | ICD-10-CM

## 2013-07-19 DIAGNOSIS — D649 Anemia, unspecified: Secondary | ICD-10-CM

## 2013-07-19 LAB — FECAL OCCULT BLOOD, GUAIAC: Occult Blood: NEGATIVE

## 2013-08-10 ENCOUNTER — Ambulatory Visit
Admission: RE | Admit: 2013-08-10 | Discharge: 2013-08-10 | Disposition: A | Discharge status: Home / Self Care | Payer: Managed Care, Other (non HMO) | Source: Ambulatory Visit | Attending: Radiation Oncology | Admitting: Radiation Oncology

## 2013-08-10 ENCOUNTER — Encounter: Payer: Self-pay | Admitting: Radiation Oncology

## 2013-08-10 VITALS — BP 143/78 | HR 81 | Temp 98.0°F | Resp 12 | Wt 183.6 lb

## 2013-08-10 DIAGNOSIS — C8590 Non-Hodgkin lymphoma, unspecified, unspecified site: Secondary | ICD-10-CM

## 2013-08-10 NOTE — Progress Notes (Signed)
He rates his pain as a 5 on a scale of 0-10. Pt complains of, Loss of Sleep, Fatigue, Generalized Weakness, Pain -Muscle ache and Pain Occurs -Constantly. The patient eats a regular, healthy diet. Skin presenting dry, warm and intact. Pt reports period of cramping that is severe if eating out. Also reports, restless leg syndrome.

## 2013-08-10 NOTE — Progress Notes (Signed)
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  Pt reports period of abdominal cramping that is severe  - usually for 3 hours at a time - roughly once every 2 months.  Most recently, this has been accompanies by constant heaving or vomiting.   Sometimes produces clear fluid, sometimes brownish fragments. Reports colonoscopy and endoscopy within past year were "normal" with Dr Watt Climes. He reports sensitivity to gluten and adjusts diet for this.  Per his lymphoma, no evidence of progression/recurrent disease on CT A/P in March and CT chest in May.  Has ventral hernia repaired in last year and a persistent hiatal hernia                              ALLERGIES:  is allergic to molds & smuts and pollen extract-tree extract.  Meds: Current Outpatient Prescriptions  Medication Sig Dispense Refill  . amLODipine (NORVASC) 10 MG tablet Take 1 tablet (10 mg total) by mouth every morning.  30 tablet  11  . b complex vitamins capsule Take 1 capsule by mouth daily.      . benazepril-hydrochlorthiazide (LOTENSIN HCT) 20-12.5 MG per tablet take 1 tablet by mouth once daily  30 tablet  5  . EPINEPHrine (EPIPEN JR) 0.15 MG/0.3ML injection Inject 0.15 mg into the muscle as needed. For allergic reaction      . ferrous sulfate 325 (65 FE) MG tablet Take 325 mg by mouth daily with breakfast.      . finasteride (PROSCAR) 5 MG tablet Take 1 tablet (5 mg total) by mouth daily.  30  tablet  5  . LEVSIN/SL 0.125 MG SL tablet Take 1 tablet (0.125 mg total) by mouth every 4 (four) hours as needed for cramping. Chew up fine and swallow  30 tablet  3  . loratadine (CLARITIN) 10 MG tablet Take 10 mg by mouth daily as needed. For allergies      . multivitamin-lutein (OCUVITE-LUTEIN) CAPS capsule Take 1 capsule by mouth daily.      Marland Kitchen omega-3 acid ethyl esters (LOVAZA) 1 G capsule Take 1 g by mouth daily.      Marland Kitchen omeprazole (PRILOSEC) 20 MG capsule Take 20 mg by mouth daily.      . Potassium 99 MG TABS Take by mouth. OTC potassium supplement      . Red Yeast Rice Extract (RED YEAST RICE PO) Take 1 capsule by mouth daily.        Marland Kitchen rOPINIRole (REQUIP) 1 MG tablet Take 1 tablet (1 mg total) by mouth at bedtime.  90 tablet  3  . traZODone (DESYREL) 50 MG tablet Take 50 mg by mouth at bedtime as needed.      . triazolam (HALCION) 0.25 MG tablet Take 1 tablet (0.25 mg total) by mouth at bedtime as needed.  30 tablet  5  . CHOLECALCIFEROL PO Take 800 Units by mouth.      Marland Kitchen  diclofenac (VOLTAREN) 75 MG EC tablet Take 75 mg by mouth daily.      Marland Kitchen HYDROcodone-acetaminophen (NORCO) 7.5-325 MG per tablet Take 1 tablet by mouth at bedtime. May fill on or after 02/06/2013  90 tablet  0  . PROAIR HFA 108 (90 BASE) MCG/ACT inhaler as needed.       No current facility-administered medications for this encounter.    Physical Findings: The patient is in no acute distress. Patient is alert and oriented.  weight is 183 lb 9.6 oz (83.28 kg). His oral temperature is 98 F (36.7 C). His blood pressure is 143/78 and his pulse is 81. His respiration is 12 and oxygen saturation is 95%. .    Well appearing.   General: Alert and oriented, in no acute distress HEENT: Head is normocephalic.  Extraocular movements are intact. Oropharynx is clear. Neck: Neck is supple, no palpable cervical or supraclavicular lymphadenopathy. Heart: Regular in rate and rhythm with no murmurs, rubs, or gallops. Chest: Clear to  auscultation bilaterally, with no rhonchi, wheezes, or rales. Abdomen: Soft, nontender, nondistended, with no rigidity or guarding. Extremities: No cyanosis or edema. Musculoskeletal: symmetric strength and muscle tone throughout. Neurologic: Cranial nerves II through XII are grossly intact. No obvious focalities. Speech is fluent. Psychiatric: Judgment and insight are intact. Affect is appropriate.   Lab Findings: Lab Results  Component Value Date   WBC 4.4 06/26/2013   HGB 12.4* 06/26/2013   HCT 38.8 06/26/2013   MCV 84.2 06/26/2013   PLT 230 06/26/2013    Radiographic Findings: No results found.  Impression/Plan:  We discussed him GI symptoms for a while - they are chronic in nature, for years, but episodes of abdominal pain and vomiting are more severe since completing RT. I think it is very unlikely that the dose/ nature of RT he received on the past would explain these symptoms.  I wonder if he could possible have a volvulus. Or, if the hiatal hernia is playing a role.  If H Pylori hasn't been ruled out, this is a test to consider as well.  I also would recommend, that in addition to discussing these factors with Dr Clarene Essex  of GI, that he go to the ER next time he has an episode, so that his sign/symptoms may be worked up (ie a plain film of abdomen) to possibly determine the etiology.  Otherwise I will see him back in 1 year. They are pleased with this plan.   I spent 45 minutes face to face with the patient and more than 50% of that time was spent in counseling and/or coordination of care. _____________________________________   Eppie Gibson, MD

## 2013-08-14 ENCOUNTER — Ambulatory Visit: Payer: Medicare Other | Admitting: Radiation Oncology

## 2013-08-14 ENCOUNTER — Encounter: Payer: Self-pay | Admitting: Radiation Oncology

## 2013-08-27 ENCOUNTER — Other Ambulatory Visit: Payer: Managed Care, Other (non HMO)

## 2013-08-28 ENCOUNTER — Telehealth: Payer: Self-pay | Admitting: Oncology

## 2013-08-28 NOTE — Telephone Encounter (Signed)
returned pt's call re r/s missed lab appt. lmonvm for pt to call back to let us know when he would like to come in.

## 2013-09-03 ENCOUNTER — Telehealth: Payer: Self-pay | Admitting: Oncology

## 2013-09-03 ENCOUNTER — Ambulatory Visit (HOSPITAL_BASED_OUTPATIENT_CLINIC_OR_DEPARTMENT_OTHER): Payer: Managed Care, Other (non HMO)

## 2013-09-03 DIAGNOSIS — C8589 Other specified types of non-Hodgkin lymphoma, extranodal and solid organ sites: Secondary | ICD-10-CM

## 2013-09-03 DIAGNOSIS — D509 Iron deficiency anemia, unspecified: Secondary | ICD-10-CM

## 2013-09-03 DIAGNOSIS — R7989 Other specified abnormal findings of blood chemistry: Secondary | ICD-10-CM

## 2013-09-03 LAB — CBC WITH DIFFERENTIAL/PLATELET
BASO%: 0.5 % (ref 0.0–2.0)
Basophils Absolute: 0 10*3/uL (ref 0.0–0.1)
EOS%: 0.7 % (ref 0.0–7.0)
Eosinophils Absolute: 0.1 10*3/uL (ref 0.0–0.5)
HCT: 43.5 % (ref 38.4–49.9)
HGB: 14 g/dL (ref 13.0–17.1)
LYMPH%: 17 % (ref 14.0–49.0)
MCH: 28.4 pg (ref 27.2–33.4)
MCHC: 32.1 g/dL (ref 32.0–36.0)
MCV: 88.5 fL (ref 79.3–98.0)
MONO#: 0.6 10*3/uL (ref 0.1–0.9)
MONO%: 6.9 % (ref 0.0–14.0)
NEUT#: 6.1 10*3/uL (ref 1.5–6.5)
NEUT%: 74.9 % (ref 39.0–75.0)
Platelets: 232 10*3/uL (ref 140–400)
RBC: 4.92 10*6/uL (ref 4.20–5.82)
RDW: 20.8 % — ABNORMAL HIGH (ref 11.0–14.6)
WBC: 8.1 10*3/uL (ref 4.0–10.3)
lymph#: 1.4 10*3/uL (ref 0.9–3.3)

## 2013-09-03 LAB — FERRITIN CHCC: FERRITIN: 47 ng/mL (ref 22–316)

## 2013-09-03 NOTE — Telephone Encounter (Signed)
pt came in missed lab....r/s lab....pt got labs today

## 2013-09-04 ENCOUNTER — Telehealth: Payer: Self-pay | Admitting: *Deleted

## 2013-09-04 NOTE — Telephone Encounter (Signed)
Spoke with pt's wife, lab results given. HGB and iron are better, per Dr. Benay Spice. Continue iron. She voiced understanding.

## 2013-09-04 NOTE — Telephone Encounter (Signed)
Message copied by Brien Few on Tue Sep 04, 2013 12:12 PM ------      Message from: [place], [place]: Mon Sep 03, 2013  6:27 PM       Please call patient, hb and iron are better, continue iron ------

## 2013-09-27 ENCOUNTER — Telehealth: Payer: Self-pay | Admitting: *Deleted

## 2013-09-27 NOTE — Telephone Encounter (Signed)
Patient called Dr. Perley Jain office and cancelled his appointment there for 10/01/13. Told them he had another appointment that day and will call back to reschedule. Dr. Watt Climes wanted Dr. Benay Spice aware.

## 2013-12-12 ENCOUNTER — Other Ambulatory Visit: Payer: Self-pay | Admitting: Neurological Surgery

## 2013-12-18 ENCOUNTER — Encounter (HOSPITAL_COMMUNITY): Payer: Self-pay

## 2013-12-18 NOTE — Pre-Procedure Instructions (Signed)
Ryan [name]  12/18/2013   Your procedure is scheduled on:  Monday November 30.2015 at 1327 PM  Report to [name] at 1127 AM.  Call this number if you have problems the morning of surgery: [phone]   Remember:   Do not eat food or drink liquids after midnight Sunday 12/30/13.   Take these medicines the morning of surgery with A SIP OF WATER: Xanax if needed for anxiety, Norvasc, Claritin, Prilosec, use and bring inhaler day of surgery.  Stop all herbal medication, Nsaids(Ibuprofen, Motrin, Advil Aleve), Vitamins, and Diclofenac 5 days prior to surgery 12/27/13.   Do not wear jewelry, make-up or nail polish.  Do not wear lotions, powders, or colognes. You may NOT wear deodorant. Men may shave face and neck.  Do not bring valuables to the hospital.  [name] is not responsible  for any belongings or valuables.               Contacts, dentures or bridgework may not be worn into surgery.  Leave suitcase in the car. After surgery it may be brought to your room.  For patients admitted to the hospital, discharge time is determined by your treatment team.               Patients discharged the day of surgery will not be allowed to drive home.    Special Instructions: [place] - Preparing for Surgery  Before surgery, you can play an important role.  Because skin is not sterile, your skin needs to be as free of germs as possible.  You can reduce the number of germs on you skin by washing with CHG (chlorahexidine gluconate) soap before surgery.  CHG is an antiseptic cleaner which kills germs and bonds with the skin to continue killing germs even after washing.  Please DO NOT use if you have an allergy to CHG or antibacterial soaps.  If your skin becomes reddened/irritated stop using the CHG and inform your nurse when you arrive at Short Stay.  Do not shave (including legs and underarms) for at least 48 hours prior to the first CHG shower.  You may shave your  face.  Please follow these instructions carefully:   1.  Shower with CHG Soap the night before surgery and the                                morning of Surgery.  2.  If you choose to wash your hair, wash your hair first as usual with your       normal shampoo.  3.  After you shampoo, rinse your hair and body thoroughly to remove the                      Shampoo.  4.  Use CHG as you would any other liquid soap.  You can apply chg directly       to the skin and wash gently with scrungie or a clean washcloth.  5.  Apply the CHG Soap to your body ONLY FROM THE NECK DOWN.        Do not use on open wounds or open sores.  Avoid contact with your eyes,       ears, mouth and genitals (private parts).  Wash genitals (private parts)       with your normal soap.  6.  Wash thoroughly, paying special attention  to the area where your surgery        will be performed.  7.  Thoroughly rinse your body with warm water from the neck down.  8.  DO NOT shower/wash with your normal soap after using and rinsing off       the CHG Soap.  9.  Pat yourself dry with a clean towel.            10.  Wear clean pajamas.            11.  Place clean sheets on your bed the night of your first shower and do not        sleep with pets.  Day of Surgery  Do not apply any lotions/deoderants the morning of surgery.  Please wear clean clothes to the hospital/surgery center.      Please read over the following fact sheets that you were given: Pain Booklet, Coughing and Deep Breathing, MRSA Information and Surgical Site Infection Prevention

## 2013-12-19 ENCOUNTER — Encounter (HOSPITAL_COMMUNITY)
Admission: RE | Admit: 2013-12-19 | Discharge: 2013-12-19 | Disposition: A | Discharge status: Home / Self Care | Payer: Medicare HMO | Source: Ambulatory Visit | Attending: Neurological Surgery | Admitting: Neurological Surgery

## 2013-12-19 ENCOUNTER — Encounter (HOSPITAL_COMMUNITY): Payer: Self-pay

## 2013-12-19 DIAGNOSIS — N400 Enlarged prostate without lower urinary tract symptoms: Secondary | ICD-10-CM | POA: Insufficient documentation

## 2013-12-19 DIAGNOSIS — E785 Hyperlipidemia, unspecified: Secondary | ICD-10-CM | POA: Insufficient documentation

## 2013-12-19 DIAGNOSIS — E739 Lactose intolerance, unspecified: Secondary | ICD-10-CM | POA: Diagnosis not present

## 2013-12-19 DIAGNOSIS — I340 Nonrheumatic mitral (valve) insufficiency: Secondary | ICD-10-CM | POA: Insufficient documentation

## 2013-12-19 DIAGNOSIS — Z87891 Personal history of nicotine dependence: Secondary | ICD-10-CM | POA: Insufficient documentation

## 2013-12-19 DIAGNOSIS — I351 Nonrheumatic aortic (valve) insufficiency: Secondary | ICD-10-CM | POA: Insufficient documentation

## 2013-12-19 DIAGNOSIS — D509 Iron deficiency anemia, unspecified: Secondary | ICD-10-CM | POA: Insufficient documentation

## 2013-12-19 DIAGNOSIS — C439 Malignant melanoma of skin, unspecified: Secondary | ICD-10-CM | POA: Diagnosis not present

## 2013-12-19 DIAGNOSIS — K900 Celiac disease: Secondary | ICD-10-CM | POA: Insufficient documentation

## 2013-12-19 DIAGNOSIS — K449 Diaphragmatic hernia without obstruction or gangrene: Secondary | ICD-10-CM | POA: Insufficient documentation

## 2013-12-19 DIAGNOSIS — Z923 Personal history of irradiation: Secondary | ICD-10-CM | POA: Insufficient documentation

## 2013-12-19 DIAGNOSIS — Z01818 Encounter for other preprocedural examination: Secondary | ICD-10-CM | POA: Insufficient documentation

## 2013-12-19 DIAGNOSIS — I7789 Other specified disorders of arteries and arterioles: Secondary | ICD-10-CM | POA: Insufficient documentation

## 2013-12-19 DIAGNOSIS — G4700 Insomnia, unspecified: Secondary | ICD-10-CM | POA: Insufficient documentation

## 2013-12-19 DIAGNOSIS — C8590 Non-Hodgkin lymphoma, unspecified, unspecified site: Secondary | ICD-10-CM | POA: Diagnosis not present

## 2013-12-19 DIAGNOSIS — R5382 Chronic fatigue, unspecified: Secondary | ICD-10-CM | POA: Diagnosis not present

## 2013-12-19 DIAGNOSIS — I4519 Other right bundle-branch block: Secondary | ICD-10-CM | POA: Diagnosis not present

## 2013-12-19 DIAGNOSIS — M79604 Pain in right leg: Secondary | ICD-10-CM | POA: Diagnosis not present

## 2013-12-19 DIAGNOSIS — N281 Cyst of kidney, acquired: Secondary | ICD-10-CM | POA: Diagnosis not present

## 2013-12-19 DIAGNOSIS — I10 Essential (primary) hypertension: Secondary | ICD-10-CM | POA: Diagnosis not present

## 2013-12-19 DIAGNOSIS — K2270 Barrett's esophagus without dysplasia: Secondary | ICD-10-CM | POA: Insufficient documentation

## 2013-12-19 HISTORY — DX: Other general symptoms and signs: R68.89

## 2013-12-19 LAB — CBC
HCT: 42.4 % (ref 39.0–52.0)
Hemoglobin: 13.9 g/dL (ref 13.0–17.0)
MCH: 31.9 pg (ref 26.0–34.0)
MCHC: 32.8 g/dL (ref 30.0–36.0)
MCV: 97.2 fL (ref 78.0–100.0)
PLATELETS: 234 10*3/uL (ref 150–400)
RBC: 4.36 MIL/uL (ref 4.22–5.81)
RDW: 13.5 % (ref 11.5–15.5)
WBC: 4.1 10*3/uL (ref 4.0–10.5)

## 2013-12-19 LAB — BASIC METABOLIC PANEL
Anion gap: 12 (ref 5–15)
BUN: 22 mg/dL (ref 6–23)
CO2: 27 mEq/L (ref 19–32)
Calcium: 8.5 mg/dL (ref 8.4–10.5)
Chloride: 102 mEq/L (ref 96–112)
Creatinine, Ser: 0.92 mg/dL (ref 0.50–1.35)
GFR calc Af Amer: 90 mL/min — ABNORMAL LOW (ref >90)
GFR, EST NON AFRICAN AMERICAN: 78 mL/min — AB (ref >90)
Glucose, Bld: 90 mg/dL (ref 70–99)
POTASSIUM: 3.9 meq/L (ref 3.7–5.3)
SODIUM: 141 meq/L (ref 137–147)

## 2013-12-19 LAB — SURGICAL PCR SCREEN
MRSA, PCR: NEGATIVE
Staphylococcus aureus: NEGATIVE

## 2013-12-20 NOTE — Progress Notes (Signed)
Anesthesia Chart Review:  Patient is a 80 year old male posted for right L4-5, L5-S1 laminectomy on 12/31/13 by Dr. Ellene Route.    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 PCP is listed as Dr. Anastasia Pall. Oncologist Dr. Benay Spice. He saw GI Dr. Watt Climes in 2015. 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  Meds: Xanax, cholecalciferiol, Epi pen, Norco, Levsin, ProAir, KCL, Red Yeast Rice, Tylenol, amlodipine, B complex, benazepril-HCTZ, diclofenac, ferrous sulfate, finasteride, ibuprofen, loratadine, Ocuvite, omeprazole, Requip, trazodone, Halcion.  EKG on 12/19/13: SR with PACs, incomplete right BBB, U waves present (upright). Has had an incomplete right BBB since at least 08/21/12.  There is no mention of U waves on previous 08/21/12 or 12/15/10 EKGs. U waves can be seen for varying reasons including LVH, bradycardia, hypocalcemia. HR was > 60, but he did have moderate LV focal basal hypertrophy on echo.  Ca was 8.5.     Echo on 12/04/12: - Left ventricle: The cavity size was normal. There was moderate focal basal hypertrophy of the septum. Systolicfunction was normal. The estimated ejection fraction was in the range of 60% to 65%. Wall motion was normal; there were no regional wall motion abnormalities. Features are consistent with a pseudonormal left ventricular filling pattern, with concomitant abnormal relaxation andincreased filling pressure (grade 2 diastolicdysfunction). Doppler parameters are consistent with highventricular filling pressure. - Aortic valve: Trivial regurgitation. - Mitral valve: Mild regurgitation. - Left atrium: The atrium was moderately dilated. - Pulmonary arteries: Systolic pressure was mildly to moderately increased. PA peak pressure:  103m Hg (S). Impressions: Proximal septal thickening with mildly elevated LVOTgradient of 2 m/s.  Cardiac cath on 06/20/05 (Dr. TShelva Majestic:  1. Normal left ventricular function. 2. Essentially normal coronary arteries, with evidence for a very small portion of mid left anterior descending dipping intramyocardially without evidence for significant dynamic obstruction or fixed obstructive disease. 3. Normal aortoiliac system.  CT of the chest on 06/20/13: No CT findings for acute pulmonary process. No worrisome pulmonary lesions. Stable large hiatal hernia. Stable scattered mediastinal and hilar lymph nodes but no mass or adenopathy. Stable mild enlargement of the pulmonary arteries which could be due to pulmonary hypertension.  Preoperative labs noted.  Above reviewed with anesthesiologist Dr. JGlennon Mac  Patient without known CAD (essentially normal coronaries in 2007) and normal LVEF by echo one year ago. If no acute changes then it is anticipated that he can proceed as planned.  AGeorge HughMSelect Specialty Hospital - SaginawShort Stay Center/Anesthesiology Phone ([phone]/19/2015 12:31 PM

## 2013-12-25 ENCOUNTER — Other Ambulatory Visit (HOSPITAL_BASED_OUTPATIENT_CLINIC_OR_DEPARTMENT_OTHER): Payer: Managed Care, Other (non HMO)

## 2013-12-25 ENCOUNTER — Ambulatory Visit (HOSPITAL_BASED_OUTPATIENT_CLINIC_OR_DEPARTMENT_OTHER): Payer: Managed Care, Other (non HMO) | Admitting: Oncology

## 2013-12-25 ENCOUNTER — Telehealth: Payer: Self-pay | Admitting: *Deleted

## 2013-12-25 VITALS — BP 123/73 | HR 75 | Temp 97.8°F | Resp 18 | Ht 66.75 in | Wt 184.0 lb

## 2013-12-25 DIAGNOSIS — C8590 Non-Hodgkin lymphoma, unspecified, unspecified site: Secondary | ICD-10-CM

## 2013-12-25 DIAGNOSIS — R918 Other nonspecific abnormal finding of lung field: Secondary | ICD-10-CM

## 2013-12-25 DIAGNOSIS — D649 Anemia, unspecified: Secondary | ICD-10-CM

## 2013-12-25 LAB — COMPREHENSIVE METABOLIC PANEL (CC13)
ALT: 10 U/L (ref 0–55)
ANION GAP: 10 meq/L (ref 3–11)
AST: 22 U/L (ref 5–34)
Albumin: 4 g/dL (ref 3.5–5.0)
Alkaline Phosphatase: 82 U/L (ref 40–150)
BUN: 19 mg/dL (ref 7.0–26.0)
CALCIUM: 8.9 mg/dL (ref 8.4–10.4)
CO2: 27 meq/L (ref 22–29)
Chloride: 104 mEq/L (ref 98–109)
Creatinine: 1 mg/dL (ref 0.7–1.3)
Glucose: 105 mg/dl (ref 70–140)
POTASSIUM: 3.2 meq/L — AB (ref 3.5–5.1)
SODIUM: 141 meq/L (ref 136–145)
TOTAL PROTEIN: 6.6 g/dL (ref 6.4–8.3)
Total Bilirubin: 0.4 mg/dL (ref 0.20–1.20)

## 2013-12-25 LAB — CBC WITH DIFFERENTIAL/PLATELET
BASO%: 0.7 % (ref 0.0–2.0)
Basophils Absolute: 0 10*3/uL (ref 0.0–0.1)
EOS%: 2.5 % (ref 0.0–7.0)
Eosinophils Absolute: 0.1 10*3/uL (ref 0.0–0.5)
HCT: 44.8 % (ref 38.4–49.9)
HGB: 14.8 g/dL (ref 13.0–17.1)
LYMPH#: 1.2 10*3/uL (ref 0.9–3.3)
LYMPH%: 23.4 % (ref 14.0–49.0)
MCH: 31.8 pg (ref 27.2–33.4)
MCHC: 33.1 g/dL (ref 32.0–36.0)
MCV: 96.1 fL (ref 79.3–98.0)
MONO#: 0.4 10*3/uL (ref 0.1–0.9)
MONO%: 8.1 % (ref 0.0–14.0)
NEUT#: 3.3 10*3/uL (ref 1.5–6.5)
NEUT%: 65.3 % (ref 39.0–75.0)
Platelets: 249 10*3/uL (ref 140–400)
RBC: 4.66 10*6/uL (ref 4.20–5.82)
RDW: 13.8 % (ref 11.0–14.6)
WBC: 5.1 10*3/uL (ref 4.0–10.3)

## 2013-12-25 LAB — FERRITIN CHCC: Ferritin: 56 ng/ml (ref 22–316)

## 2013-12-25 LAB — LACTATE DEHYDROGENASE (CC13): LDH: 192 U/L (ref 125–245)

## 2013-12-25 NOTE — Telephone Encounter (Signed)
Per Dr. Benay Spice; left voice message for Dr. Ellene Route RN re: patient exam today.  Dr. Benay Spice advising to consider pre-op CXR d/t wheezing/rhonchi on exam today 12/25/13.  Call office if any questions.

## 2013-12-25 NOTE — Progress Notes (Signed)
  [place] OFFICE PROGRESS NOTE   Diagnosis: Non-Hodgkin's lymphoma  INTERVAL HISTORY:   He returns as scheduled. No fever, night sweats, or anorexia. He reports being diagnosed with spinal stenosis and is scheduled for surgery with Dr. Ellene Route next week. He has back and right leg pain. He relates intermittent wheezing to allergies.  Objective:  Vital signs in last 24 hours:  Blood pressure 123/73, pulse 75, temperature 97.8 F (36.6 C), temperature source Oral, resp. rate 18, height 5' 6.75" (1.695 m), weight 184 lb (83.462 kg).    HEENT: Neck without mass Lymphatics: No cervical, supraclavicular, axillary, or inguinal nodes Resp: Scattered inspiratory rhonchi at the lower greater than upper posterior chest bilaterally, no respiratory distress Cardio: Regular rate and rhythm GI: No hepatomegaly, no mass Vascular: 1+ pitting edema below the knee bilaterally   Lab Results:  Lab Results  Component Value Date   WBC 5.1 12/25/2013   HGB 14.8 12/25/2013   HCT 44.8 12/25/2013   MCV 96.1 12/25/2013   PLT 249 12/25/2013   NEUTROABS 3.3 12/25/2013   Ferritin-56  Medications: I have reviewed the patient's current medications.  Assessment/Plan: 1. Non-Hodgkin's,, low-grade B cell diagnosed in January 2013, disease localized to the pelvis, status post involved field radiation to 3600cGy completed in March of 2013, no evidence of recurrent lymphoma on a CT 04/17/2013  2. Intermittent abdominal pain-etiology unclear  3. Chronic back pain-followed by Dr. Raford Pitcher, status post epidural steroid injections  4. Anemia-history of iron deficiency, normal ferritin in August 2015 and November 2015    Disposition:  He remains in clinical remission from non-Hodgkin's lymphoma. Mr. Norrington will return for an office visit in 6 months. He will obtain a 13 valent pneumococcal vaccine. He has an abnormal lung exam today, likely related to allergies. No clinical symptoms to  suggest pneumonia. We will ask Dr. Ellene Route to consider a preoperative chest x-ray.  Betsy Coder, MD  12/25/2013  11:30 AM

## 2013-12-26 ENCOUNTER — Telehealth: Payer: Self-pay | Admitting: Oncology

## 2013-12-26 ENCOUNTER — Other Ambulatory Visit (HOSPITAL_COMMUNITY): Payer: Self-pay | Admitting: Neurological Surgery

## 2013-12-26 NOTE — Telephone Encounter (Signed)
Lft msg for pt confirming labs/ov per 11/24 POF, mailed sch to pt.... KJ

## 2013-12-28 ENCOUNTER — Telehealth: Payer: Self-pay | Admitting: *Deleted

## 2013-12-28 NOTE — Telephone Encounter (Signed)
Made wife aware of low K+ level and that copy sent to PCP. May need to increase his K+ intake. F/U with PCP.

## 2013-12-31 ENCOUNTER — Encounter (HOSPITAL_COMMUNITY)
Admission: RE | Disposition: A | Discharge status: Home / Self Care | Payer: Self-pay | Source: Ambulatory Visit | Attending: Neurological Surgery

## 2013-12-31 ENCOUNTER — Ambulatory Visit (HOSPITAL_COMMUNITY): Payer: Medicare HMO

## 2013-12-31 ENCOUNTER — Encounter (HOSPITAL_COMMUNITY): Payer: Self-pay | Admitting: *Deleted

## 2013-12-31 ENCOUNTER — Ambulatory Visit (HOSPITAL_COMMUNITY): Payer: Medicare HMO | Admitting: Certified Registered Nurse Anesthetist

## 2013-12-31 ENCOUNTER — Ambulatory Visit (HOSPITAL_COMMUNITY): Payer: Medicare HMO | Admitting: Vascular Surgery

## 2013-12-31 ENCOUNTER — Inpatient Hospital Stay (HOSPITAL_COMMUNITY)
Admission: RE | Admit: 2013-12-31 | Discharge: 2014-01-01 | DRG: 460 | Disposition: A | Discharge status: Home / Self Care | Payer: Medicare HMO | Source: Ambulatory Visit | Attending: Neurological Surgery | Admitting: Neurological Surgery

## 2013-12-31 DIAGNOSIS — E785 Hyperlipidemia, unspecified: Secondary | ICD-10-CM | POA: Diagnosis present

## 2013-12-31 DIAGNOSIS — M4727 Other spondylosis with radiculopathy, lumbosacral region: Principal | ICD-10-CM | POA: Diagnosis present

## 2013-12-31 DIAGNOSIS — Z87891 Personal history of nicotine dependence: Secondary | ICD-10-CM | POA: Diagnosis not present

## 2013-12-31 DIAGNOSIS — E739 Lactose intolerance, unspecified: Secondary | ICD-10-CM | POA: Diagnosis present

## 2013-12-31 DIAGNOSIS — G479 Sleep disorder, unspecified: Secondary | ICD-10-CM | POA: Diagnosis present

## 2013-12-31 DIAGNOSIS — M4310 Spondylolisthesis, site unspecified: Secondary | ICD-10-CM

## 2013-12-31 DIAGNOSIS — K5730 Diverticulosis of large intestine without perforation or abscess without bleeding: Secondary | ICD-10-CM | POA: Diagnosis present

## 2013-12-31 DIAGNOSIS — I10 Essential (primary) hypertension: Secondary | ICD-10-CM | POA: Diagnosis not present

## 2013-12-31 DIAGNOSIS — N400 Enlarged prostate without lower urinary tract symptoms: Secondary | ICD-10-CM | POA: Diagnosis present

## 2013-12-31 DIAGNOSIS — D509 Iron deficiency anemia, unspecified: Secondary | ICD-10-CM | POA: Diagnosis present

## 2013-12-31 DIAGNOSIS — M79604 Pain in right leg: Secondary | ICD-10-CM | POA: Diagnosis present

## 2013-12-31 DIAGNOSIS — K219 Gastro-esophageal reflux disease without esophagitis: Secondary | ICD-10-CM | POA: Diagnosis present

## 2013-12-31 DIAGNOSIS — M4806 Spinal stenosis, lumbar region: Secondary | ICD-10-CM | POA: Diagnosis present

## 2013-12-31 DIAGNOSIS — K2270 Barrett's esophagus without dysplasia: Secondary | ICD-10-CM | POA: Diagnosis not present

## 2013-12-31 DIAGNOSIS — Z8572 Personal history of non-Hodgkin lymphomas: Secondary | ICD-10-CM

## 2013-12-31 DIAGNOSIS — M5416 Radiculopathy, lumbar region: Secondary | ICD-10-CM | POA: Diagnosis present

## 2013-12-31 DIAGNOSIS — Z01818 Encounter for other preprocedural examination: Secondary | ICD-10-CM

## 2013-12-31 DIAGNOSIS — Z86010 Personal history of colonic polyps: Secondary | ICD-10-CM | POA: Diagnosis not present

## 2013-12-31 HISTORY — PX: LUMBAR LAMINECTOMY/DECOMPRESSION MICRODISCECTOMY: SHX5026

## 2013-12-31 SURGERY — LUMBAR LAMINECTOMY/DECOMPRESSION MICRODISCECTOMY 2 LEVELS
Anesthesia: General | Site: Spine Lumbar | Laterality: Right

## 2013-12-31 MED ORDER — LACTATED RINGERS IV SOLN
INTRAVENOUS | Status: DC | PRN
  Administered 2013-12-31 (×2): via INTRAVENOUS

## 2013-12-31 MED ORDER — CEFAZOLIN SODIUM 1-5 GM-% IV SOLN
1.0000 g | Freq: Three times a day (TID) | INTRAVENOUS | Status: AC
  Administered 2013-12-31 – 2014-01-01 (×2): 1 g via INTRAVENOUS
  Filled 2013-12-31 (×2): qty 50

## 2013-12-31 MED ORDER — PANTOPRAZOLE SODIUM 40 MG PO TBEC
40.0000 mg | DELAYED_RELEASE_TABLET | Freq: Every day | ORAL | Status: DC
  Administered 2014-01-01: 40 mg via ORAL
  Filled 2013-12-31: qty 1

## 2013-12-31 MED ORDER — NEOSTIGMINE METHYLSULFATE 10 MG/10ML IV SOLN
INTRAVENOUS | Status: DC | PRN
Start: 2013-12-31 — End: 2013-12-31
  Administered 2013-12-31: 5 mg via INTRAVENOUS

## 2013-12-31 MED ORDER — MORPHINE SULFATE 2 MG/ML IJ SOLN: 1.0000 mg | INTRAMUSCULAR | Status: DC | PRN

## 2013-12-31 MED ORDER — FENTANYL CITRATE 0.05 MG/ML IJ SOLN
INTRAMUSCULAR | Status: AC
  Administered 2013-12-31: 25 ug via INTRAVENOUS
  Filled 2013-12-31: qty 2

## 2013-12-31 MED ORDER — CEFAZOLIN SODIUM-DEXTROSE 2-3 GM-% IV SOLR
INTRAVENOUS | Status: AC
  Filled 2013-12-31: qty 50

## 2013-12-31 MED ORDER — SODIUM CHLORIDE 0.9 % IJ SOLN: 3.0000 mL | Freq: Two times a day (BID) | INTRAMUSCULAR | Status: DC

## 2013-12-31 MED ORDER — SODIUM CHLORIDE 0.9 % IR SOLN
Status: DC | PRN
  Administered 2013-12-31: 16:00:00

## 2013-12-31 MED ORDER — PHENOL 1.4 % MT LIQD: 1.0000 | OROMUCOSAL | Status: DC | PRN

## 2013-12-31 MED ORDER — POTASSIUM 99 MG PO TABS: ORAL_TABLET | Freq: Every day | ORAL | Status: DC

## 2013-12-31 MED ORDER — LACTATED RINGERS IV SOLN
INTRAVENOUS | Status: DC
  Administered 2013-12-31: 13:00:00 via INTRAVENOUS

## 2013-12-31 MED ORDER — BISACODYL 10 MG RE SUPP: 10.0000 mg | Freq: Every day | RECTAL | Status: DC | PRN

## 2013-12-31 MED ORDER — SODIUM CHLORIDE 0.9 % IJ SOLN: 3.0000 mL | INTRAMUSCULAR | Status: DC | PRN

## 2013-12-31 MED ORDER — THROMBIN 5000 UNITS EX SOLR
CUTANEOUS | Status: DC | PRN
  Administered 2013-12-31 (×2): 5000 [IU] via TOPICAL

## 2013-12-31 MED ORDER — HYDROCHLOROTHIAZIDE 12.5 MG PO CAPS
12.5000 mg | ORAL_CAPSULE | Freq: Every day | ORAL | Status: DC
  Administered 2013-12-31 – 2014-01-01 (×2): 12.5 mg via ORAL
  Filled 2013-12-31 (×2): qty 1

## 2013-12-31 MED ORDER — HYDROCODONE-ACETAMINOPHEN 5-325 MG PO TABS
1.0000 | ORAL_TABLET | ORAL | Status: DC | PRN
  Administered 2014-01-01 (×2): 2 via ORAL
  Filled 2013-12-31 (×2): qty 2

## 2013-12-31 MED ORDER — LIDOCAINE-EPINEPHRINE 1 %-1:100000 IJ SOLN
INTRAMUSCULAR | Status: DC | PRN
  Administered 2013-12-31: 5 mL

## 2013-12-31 MED ORDER — PROPOFOL 10 MG/ML IV BOLUS
INTRAVENOUS | Status: AC
  Filled 2013-12-31: qty 20

## 2013-12-31 MED ORDER — SENNA 8.6 MG PO TABS
1.0000 | ORAL_TABLET | Freq: Two times a day (BID) | ORAL | Status: DC
  Administered 2013-12-31 – 2014-01-01 (×2): 8.6 mg via ORAL
  Filled 2013-12-31 (×3): qty 1

## 2013-12-31 MED ORDER — KETOROLAC TROMETHAMINE 15 MG/ML IJ SOLN
15.0000 mg | Freq: Four times a day (QID) | INTRAMUSCULAR | Status: DC
  Administered 2013-12-31 – 2014-01-01 (×3): 15 mg via INTRAVENOUS
  Filled 2013-12-31 (×8): qty 1

## 2013-12-31 MED ORDER — FENTANYL CITRATE 0.05 MG/ML IJ SOLN
25.0000 ug | INTRAMUSCULAR | Status: DC | PRN
  Administered 2013-12-31 (×4): 25 ug via INTRAVENOUS

## 2013-12-31 MED ORDER — HYOSCYAMINE SULFATE 0.125 MG SL SUBL
0.1250 mg | SUBLINGUAL_TABLET | SUBLINGUAL | Status: DC | PRN
  Filled 2013-12-31: qty 1

## 2013-12-31 MED ORDER — CEFAZOLIN SODIUM-DEXTROSE 2-3 GM-% IV SOLR
2.0000 g | INTRAVENOUS | Status: AC
  Administered 2013-12-31: 2 g via INTRAVENOUS

## 2013-12-31 MED ORDER — LIDOCAINE HCL (CARDIAC) 20 MG/ML IV SOLN
INTRAVENOUS | Status: DC | PRN
  Administered 2013-12-31: 80 mg via INTRAVENOUS

## 2013-12-31 MED ORDER — HYDROCODONE-ACETAMINOPHEN 7.5-325 MG PO TABS: 1.0000 | ORAL_TABLET | Freq: Every day | ORAL | Status: DC

## 2013-12-31 MED ORDER — ONDANSETRON HCL 4 MG/2ML IJ SOLN
INTRAMUSCULAR | Status: AC
  Filled 2013-12-31: qty 2

## 2013-12-31 MED ORDER — ROCURONIUM BROMIDE 100 MG/10ML IV SOLN
INTRAVENOUS | Status: DC | PRN
  Administered 2013-12-31: 50 mg via INTRAVENOUS

## 2013-12-31 MED ORDER — FENTANYL CITRATE 0.05 MG/ML IJ SOLN
INTRAMUSCULAR | Status: DC | PRN
  Administered 2013-12-31 (×2): 50 ug via INTRAVENOUS

## 2013-12-31 MED ORDER — OXYCODONE HCL 5 MG PO TABS
5.0000 mg | ORAL_TABLET | Freq: Once | ORAL | Status: DC | PRN
Start: 2013-12-31 — End: 2013-12-31

## 2013-12-31 MED ORDER — LORATADINE 10 MG PO TABS
10.0000 mg | ORAL_TABLET | Freq: Every day | ORAL | Status: DC | PRN
  Filled 2013-12-31: qty 1

## 2013-12-31 MED ORDER — FENTANYL CITRATE 0.05 MG/ML IJ SOLN
INTRAMUSCULAR | Status: AC
  Filled 2013-12-31: qty 5

## 2013-12-31 MED ORDER — ALPRAZOLAM 0.25 MG PO TABS: 0.2500 mg | ORAL_TABLET | Freq: Two times a day (BID) | ORAL | Status: DC | PRN

## 2013-12-31 MED ORDER — SODIUM CHLORIDE 0.9 % IV SOLN: 250.0000 mL | INTRAVENOUS | Status: DC

## 2013-12-31 MED ORDER — BENAZEPRIL HCL 20 MG PO TABS
20.0000 mg | ORAL_TABLET | Freq: Every day | ORAL | Status: DC
  Administered 2013-12-31 – 2014-01-01 (×2): 20 mg via ORAL
  Filled 2013-12-31 (×2): qty 1

## 2013-12-31 MED ORDER — ACETAMINOPHEN 650 MG RE SUPP: 650.0000 mg | RECTAL | Status: DC | PRN

## 2013-12-31 MED ORDER — ROCURONIUM BROMIDE 50 MG/5ML IV SOLN
INTRAVENOUS | Status: AC
  Filled 2013-12-31: qty 1

## 2013-12-31 MED ORDER — EPHEDRINE SULFATE 50 MG/ML IJ SOLN
INTRAMUSCULAR | Status: DC | PRN
  Administered 2013-12-31 (×2): 10 mg via INTRAVENOUS

## 2013-12-31 MED ORDER — PROPOFOL 10 MG/ML IV BOLUS
INTRAVENOUS | Status: DC | PRN
  Administered 2013-12-31: 120 mg via INTRAVENOUS

## 2013-12-31 MED ORDER — ONDANSETRON HCL 4 MG/2ML IJ SOLN: 4.0000 mg | Freq: Four times a day (QID) | INTRAMUSCULAR | Status: DC | PRN

## 2013-12-31 MED ORDER — DEXAMETHASONE SODIUM PHOSPHATE 4 MG/ML IJ SOLN
INTRAMUSCULAR | Status: DC | PRN
  Administered 2013-12-31: 10 mg via INTRAVENOUS

## 2013-12-31 MED ORDER — LIDOCAINE HCL (CARDIAC) 20 MG/ML IV SOLN
INTRAVENOUS | Status: AC
  Filled 2013-12-31: qty 5

## 2013-12-31 MED ORDER — METHOCARBAMOL 500 MG PO TABS
500.0000 mg | ORAL_TABLET | Freq: Four times a day (QID) | ORAL | Status: DC | PRN
  Administered 2013-12-31 – 2014-01-01 (×2): 500 mg via ORAL
  Filled 2013-12-31 (×2): qty 1

## 2013-12-31 MED ORDER — ONDANSETRON HCL 4 MG/2ML IJ SOLN: 4.0000 mg | INTRAMUSCULAR | Status: DC | PRN

## 2013-12-31 MED ORDER — PHENYLEPHRINE HCL 10 MG/ML IJ SOLN
INTRAMUSCULAR | Status: DC | PRN
  Administered 2013-12-31: 40 ug via INTRAVENOUS
  Administered 2013-12-31 (×2): 80 ug via INTRAVENOUS
  Administered 2013-12-31: 40 ug via INTRAVENOUS

## 2013-12-31 MED ORDER — AMLODIPINE BESYLATE 10 MG PO TABS
10.0000 mg | ORAL_TABLET | Freq: Every morning | ORAL | Status: DC
  Administered 2014-01-01: 10 mg via ORAL
  Filled 2013-12-31: qty 1

## 2013-12-31 MED ORDER — TRAZODONE HCL 50 MG PO TABS
50.0000 mg | ORAL_TABLET | Freq: Every evening | ORAL | Status: DC | PRN
  Administered 2013-12-31: 50 mg via ORAL
  Filled 2013-12-31 (×2): qty 1

## 2013-12-31 MED ORDER — 0.9 % SODIUM CHLORIDE (POUR BTL) OPTIME
TOPICAL | Status: DC | PRN
  Administered 2013-12-31: 1000 mL

## 2013-12-31 MED ORDER — ALUM & MAG HYDROXIDE-SIMETH 200-200-20 MG/5ML PO SUSP: 30.0000 mL | Freq: Four times a day (QID) | ORAL | Status: DC | PRN

## 2013-12-31 MED ORDER — OXYCODONE-ACETAMINOPHEN 5-325 MG PO TABS
1.0000 | ORAL_TABLET | ORAL | Status: DC | PRN
  Administered 2013-12-31 (×2): 2 via ORAL
  Filled 2013-12-31 (×2): qty 2

## 2013-12-31 MED ORDER — PHENYLEPHRINE HCL 10 MG/ML IJ SOLN
10.0000 mg | INTRAVENOUS | Status: DC | PRN
  Administered 2013-12-31: 15 ug/min via INTRAVENOUS

## 2013-12-31 MED ORDER — ROPINIROLE HCL 1 MG PO TABS
1.0000 mg | ORAL_TABLET | Freq: Every day | ORAL | Status: DC
  Administered 2013-12-31: 1 mg via ORAL
  Filled 2013-12-31 (×2): qty 1

## 2013-12-31 MED ORDER — TRIAZOLAM 0.125 MG PO TABS: 0.2500 mg | ORAL_TABLET | Freq: Every evening | ORAL | Status: DC | PRN

## 2013-12-31 MED ORDER — GLYCOPYRROLATE 0.2 MG/ML IJ SOLN
INTRAMUSCULAR | Status: DC | PRN
  Administered 2013-12-31: .8 mg via INTRAVENOUS

## 2013-12-31 MED ORDER — DOCUSATE SODIUM 100 MG PO CAPS
100.0000 mg | ORAL_CAPSULE | Freq: Two times a day (BID) | ORAL | Status: DC
  Administered 2013-12-31 – 2014-01-01 (×2): 100 mg via ORAL
  Filled 2013-12-31 (×3): qty 1

## 2013-12-31 MED ORDER — POLYETHYLENE GLYCOL 3350 17 G PO PACK
17.0000 g | PACK | Freq: Every day | ORAL | Status: DC | PRN
  Filled 2013-12-31: qty 1

## 2013-12-31 MED ORDER — BUPIVACAINE HCL (PF) 0.5 % IJ SOLN
INTRAMUSCULAR | Status: DC | PRN
  Administered 2013-12-31: 20 mL
  Administered 2013-12-31: 5 mL

## 2013-12-31 MED ORDER — FINASTERIDE 5 MG PO TABS
5.0000 mg | ORAL_TABLET | Freq: Every day | ORAL | Status: DC
  Administered 2014-01-01: 5 mg via ORAL
  Filled 2013-12-31: qty 1

## 2013-12-31 MED ORDER — DEXTROSE 5 % IV SOLN
500.0000 mg | Freq: Four times a day (QID) | INTRAVENOUS | Status: DC | PRN
  Filled 2013-12-31: qty 5

## 2013-12-31 MED ORDER — BENAZEPRIL-HYDROCHLOROTHIAZIDE 20-12.5 MG PO TABS: 1.0000 | ORAL_TABLET | Freq: Every day | ORAL | Status: DC

## 2013-12-31 MED ORDER — OXYCODONE HCL 5 MG/5ML PO SOLN: 5.0000 mg | Freq: Once | ORAL | Status: DC | PRN

## 2013-12-31 MED ORDER — ACETAMINOPHEN 325 MG PO TABS: 650.0000 mg | ORAL_TABLET | ORAL | Status: DC | PRN

## 2013-12-31 MED ORDER — MENTHOL 3 MG MT LOZG: 1.0000 | LOZENGE | OROMUCOSAL | Status: DC | PRN

## 2013-12-31 MED ORDER — HEMOSTATIC AGENTS (NO CHARGE) OPTIME
TOPICAL | Status: DC | PRN
  Administered 2013-12-31: 1 via TOPICAL

## 2013-12-31 SURGICAL SUPPLY — 51 items
BAG DECANTER FOR FLEXI CONT (MISCELLANEOUS) ×2 IMPLANT
BLADE CLIPPER SURG (BLADE) IMPLANT
BUR ACORN 6.0 (BURR) IMPLANT
BUR MATCHSTICK NEURO 3.0 LAGG (BURR) ×2 IMPLANT
CANISTER SUCT 3000ML (MISCELLANEOUS) ×2 IMPLANT
CONT SPEC 4OZ CLIKSEAL STRL BL (MISCELLANEOUS) ×2 IMPLANT
DECANTER SPIKE VIAL GLASS SM (MISCELLANEOUS) ×2 IMPLANT
DRAPE LAPAROTOMY 100X72X124 (DRAPES) ×2 IMPLANT
DRAPE MICROSCOPE LEICA (MISCELLANEOUS) IMPLANT
DRAPE POUCH INSTRU U-SHP 10X18 (DRAPES) ×2 IMPLANT
DRAPE PROXIMA HALF (DRAPES) IMPLANT
DURAPREP 26ML APPLICATOR (WOUND CARE) ×2 IMPLANT
ELECT REM PT RETURN 9FT ADLT (ELECTROSURGICAL) ×2
ELECTRODE REM PT RTRN 9FT ADLT (ELECTROSURGICAL) ×1 IMPLANT
GAUZE SPONGE 4X4 12PLY STRL (GAUZE/BANDAGES/DRESSINGS) ×2 IMPLANT
GAUZE SPONGE 4X4 16PLY XRAY LF (GAUZE/BANDAGES/DRESSINGS) IMPLANT
GLOVE BIOGEL PI IND STRL 7.5 (GLOVE) IMPLANT
GLOVE BIOGEL PI IND STRL 8.5 (GLOVE) ×1 IMPLANT
GLOVE BIOGEL PI INDICATOR 7.5 (GLOVE) ×3
GLOVE BIOGEL PI INDICATOR 8.5 (GLOVE) ×1
GLOVE ECLIPSE 8.5 STRL (GLOVE) ×2 IMPLANT
GLOVE EXAM NITRILE LRG STRL (GLOVE) IMPLANT
GLOVE EXAM NITRILE MD LF STRL (GLOVE) IMPLANT
GLOVE EXAM NITRILE XL STR (GLOVE) IMPLANT
GLOVE EXAM NITRILE XS STR PU (GLOVE) IMPLANT
GLOVE SURG SS PI 7.0 STRL IVOR (GLOVE) ×2 IMPLANT
GOWN STRL REUS W/ TWL LRG LVL3 (GOWN DISPOSABLE) IMPLANT
GOWN STRL REUS W/ TWL XL LVL3 (GOWN DISPOSABLE) IMPLANT
GOWN STRL REUS W/TWL 2XL LVL3 (GOWN DISPOSABLE) ×2 IMPLANT
GOWN STRL REUS W/TWL LRG LVL3 (GOWN DISPOSABLE) ×2
GOWN STRL REUS W/TWL XL LVL3 (GOWN DISPOSABLE) ×2
KIT BASIN OR (CUSTOM PROCEDURE TRAY) ×2 IMPLANT
KIT ROOM TURNOVER OR (KITS) ×2 IMPLANT
LIQUID BAND (GAUZE/BANDAGES/DRESSINGS) ×2 IMPLANT
NDL SPNL 20GX3.5 QUINCKE YW (NEEDLE) IMPLANT
NEEDLE HYPO 22GX1.5 SAFETY (NEEDLE) ×2 IMPLANT
NEEDLE SPNL 20GX3.5 QUINCKE YW (NEEDLE) IMPLANT
NS IRRIG 1000ML POUR BTL (IV SOLUTION) ×2 IMPLANT
PACK LAMINECTOMY NEURO (CUSTOM PROCEDURE TRAY) ×2 IMPLANT
PAD ARMBOARD 7.5X6 YLW CONV (MISCELLANEOUS) ×6 IMPLANT
PATTIES SURGICAL .5 X1 (DISPOSABLE) ×2 IMPLANT
RUBBERBAND STERILE (MISCELLANEOUS) IMPLANT
SPONGE SURGIFOAM ABS GEL SZ50 (HEMOSTASIS) ×2 IMPLANT
SUT VIC AB 1 CT1 18XBRD ANBCTR (SUTURE) ×1 IMPLANT
SUT VIC AB 1 CT1 8-18 (SUTURE) ×2
SUT VIC AB 2-0 CP2 18 (SUTURE) ×2 IMPLANT
SUT VIC AB 3-0 SH 8-18 (SUTURE) ×3 IMPLANT
SYR 20ML ECCENTRIC (SYRINGE) ×2 IMPLANT
TOWEL OR 17X24 6PK STRL BLUE (TOWEL DISPOSABLE) ×2 IMPLANT
TOWEL OR 17X26 10 PK STRL BLUE (TOWEL DISPOSABLE) ×2 IMPLANT
WATER STERILE IRR 1000ML POUR (IV SOLUTION) ×2 IMPLANT

## 2013-12-31 NOTE — Anesthesia Postprocedure Evaluation (Signed)
  Anesthesia Post-op Note  Patient: Ryan [name]  Procedure(s) Performed: Procedure(s) with comments: RIGHT L4-5 L5-S1 LAMINECTOMY (Right) - RIGHT L4-5 L5-S1 LAMINECTOMY  Patient Location: PACU  Anesthesia Type:General  Level of Consciousness: awake, alert  and oriented  Airway and Oxygen Therapy: Patient Spontanous Breathing and Patient connected to nasal cannula oxygen  Post-op Pain: mild  Post-op Assessment: Post-op Vital signs reviewed, Patient's Cardiovascular Status Stable, Respiratory Function Stable, Patent Airway and Pain level controlled  Post-op Vital Signs: stable  Last Vitals:  Filed Vitals:   12/31/13 1730  BP: 136/78  Pulse: 86  Temp:   Resp: 17    Complications: No apparent anesthesia complications

## 2013-12-31 NOTE — Transfer of Care (Signed)
Immediate Anesthesia Transfer of Care Note  Patient: Ryan [name]  Procedure(s) Performed: Procedure(s) with comments: RIGHT L4-5 L5-S1 LAMINECTOMY (Right) - RIGHT L4-5 L5-S1 LAMINECTOMY  Patient Location: PACU  Anesthesia Type:General  Level of Consciousness: awake, alert  and oriented  Airway & Oxygen Therapy: Patient Spontanous Breathing  Post-op Assessment: Report given to PACU RN  Post vital signs: Reviewed and stable  Complications: No apparent anesthesia complications

## 2013-12-31 NOTE — Progress Notes (Signed)
  CARE MANAGEMENT ED NOTE 12/31/2013  Patient:  DEONTE, OTTING   Account Number:  [id number]  Date Initiated:  12/31/2013  Documentation initiated by:  Jackelyn Poling  Subjective/Objective Assessment:   80 yr old [place] c/o back and right lower extremity pain need for surgery to do a laminotomy foraminotomy and possible discectomy at L4-5 and L5-S1 on the right side     Subjective/Objective Assessment Detail:   pcp badger, michael  Wife voiced understanding of resources available to her to assist pt after [place] procedure     Action/Plan:   ED CM consulted by Rema Jasmine short stay RN when pt's wife inquired about home health services for pt after his scheduled procedure coming up with Dr Ellene Route.  CM spoke with wife via phone see resources reviewed in note below Faxed copies of   Action/Plan Detail:   [place] home health agencies, Private duty nursing services & [place] [facility] resources info to ED RN to give to wife to review Discussed needed orders from Dr Ellene Route with RN   Anticipated DC Date:  12/31/2013     Status Recommendation to Physician:   Result of Recommendation:    Other ED [place]  Other  Outpatient Services - Pt will follow up   [place]   Choice offered to / List presented to:  C-3 Spouse          Status of service:  Completed, signed off  ED Comments:   ED Comments Detail:  CM reviewed in details [employer] medicare guidelines, home health [facility]) (length of stay in home, types of [facility] staff available, coverage, primary caregiver, up to 24 hrs before services may be started), Private duty nursing (PDN-coverage, length of stay in the home types of staff available   CM provided family with a list of [place] home health agencies, PDN, and snfs.  Discussed pt to be further evaluated by unit therapists (PT/OT) for recommendation of level of care, equipment,  home health services and share this with attending MD and unit CM prior to d/c Encouraged wife to review resources including [place] [facility] and to let CM know what will be needed Discussed further interventions will occur after Dr Ellene Route writes orders for possible DME, home health etc  1257 fax confirmation received for faxed private duty nursing and [employer] senior resources faxed to Short stay RN to give to wife   home health agencies, PDN, and snfs.   Discussed pt to be further evaluated by unit therapists (PT/OT) for recommendation of level of care and share this with attending MD and unit CM

## 2013-12-31 NOTE — H&P (Signed)
MARCELLES [name] is an 80 y.o. male.   Chief Complaint: Back and right lower extremity pain HPI: Ryan [name] is an 80 year old individual who's had significant back and right lower extremity pain. He has a history of spondylosis and has degenerative scoliosis of lumbar spine. Recent MRI demonstrates that these had progressive narrowing of the foramen on the right side at L4-5 and L5-S1 but what may be a chronic disc herniation at L4-L5. Been advised regarding the need for surgery to do a laminotomy foraminotomy and possible discectomy at L4-5 and L5-S1 on the right side. He is now admitted for this procedure. Significant in his history is that he has a history of non-Hodgkin's lymphoma.   Past Medical History  Diagnosis Date  . Hyperlipemia   . Hypertension   . Benign prostatic hypertrophy   . Chronic fatigue   . Lactose intolerance   . Sleep disorder     DOES NOT SLEEP WELL  . Personal history of colonic polyps 2004    hyperplastic Dr. Collene Mares  . Gluten intolerance   . Barrett's esophagus   . Esophageal reflux   . Diverticulosis of colon (without mention of hemorrhage)   . Periaortic lymphadenopathy 02/16/2011  . Atelectasis, bilateral 02/10/11    Noted On CT Scan - Mild Dependent Atelectasis at the Lung Bases  . Hiatal hernia 02/10/11    Noted on CT Scan - Moderate Hiatal Hernia  . Renal cyst 02/10/11     Noted on CT Scan - Bilateral Renal Cysts  . S/P radiation therapy 03/15/11 - 04/09/11    Abdominal/ Pelvic Tumor, 3600 cGy/20 Fractions  . Microcytic anemia 04/20/2011   . Abdominal pain     Bloating and gas  . Gastritis   . Melanoma     lymphoma  . 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 Dysrhythmia     SKIPPED HEART BEAT SOMETIMES  . Environmental allergies   . Pain     LOWER BACK PAIN - DDD, SCOLIOSIS, BONE SPUR.  FINISHED THE 3RD EPIDURAL STEROID  INJECTION 7/15 - DONE BY DR. Nelva Bush. - STILL HAVING BACK PAIN  . Osteoarthritis     hands/feet,knees, NECK, BACK  . Congestion of throat     mouth breath at night, dry mouth    Past Surgical History  Procedure Laterality Date  . Rotator cuff repair      right  . Cataract extraction, bilateral    . Tonsillectomy    . Arthroscopic repair acl      right  . Bone marrow aspiration  02/25/11    Bone Marrow, Aspirate, Clot, and Bilateral Bx, Right PIC  . Hernia repair      LEFT INGUINAL   . Ventral hernia repair N/A 08/23/2012    Procedure: HERNIA REPAIR VENTRAL ADULT;  Surgeon: Madilyn Hook, DO;  Location: [facility];  Service: General;  Laterality: N/A;  . Insertion of mesh N/A 08/23/2012    Procedure: INSERTION OF MESH;  Surgeon: Madilyn Hook, DO;  Location: [facility]S;  Service: General;  Laterality: N/A;  . Tonsillectomy    . Eye surgery      Family History  Problem Relation Age of Onset  . Heart disease Father     MI 17  . Prostate cancer Brother   . Prostate cancer Paternal Uncle   . Prostate cancer Paternal Uncle   .  Prostate cancer Paternal Uncle   . Colon cancer Neg Hx   . Hyperlipidemia    . Stroke    . Hypertension    . ADD / ADHD     Social History:  reports that he quit smoking about 21 years ago. His smoking use included Pipe and Cigarettes. He has a 35 pack-year smoking history. He has never used smokeless tobacco. He reports that he drinks alcohol. He reports that he does not use illicit drugs.  Allergies:  Allergies  Allergen Reactions  . Molds & Smuts Other (See Comments)    Also dust mites causes sinus infections, h/a etc.  . Pollen Extract-Tree Extract Other (See Comments)    HEADACHES, TIRED , DRAINAGE FROM SINUSES    No prescriptions prior to admission    No results found for this or any previous visit (from the past 48 hour(s)). No results found.  Review of Systems  Constitutional: Negative.   HENT: Negative.   Eyes: Negative.   Respiratory:  Negative.   Gastrointestinal: Negative.   Genitourinary: Negative.   Musculoskeletal: Positive for back pain.  Skin: Negative.   Neurological:       Right leg pain and weakness.  Endo/Heme/Allergies: Negative.   Psychiatric/Behavioral: Negative.     There were no vitals taken for this visit. Physical Exam  Constitutional: He is oriented to person, place, and time. He appears well-developed and well-nourished.  HENT:  Head: Normocephalic and atraumatic.  Eyes: Conjunctivae are normal. Pupils are equal, round, and reactive to light.  Neck: Normal range of motion. Neck supple.  Cardiovascular: Normal rate and regular rhythm.   Respiratory: Effort normal and breath sounds normal.  GI: Soft. Bowel sounds are normal.  Musculoskeletal:  Back pain and right posterior superior iliac crest with radiation into the buttock. Positive straight leg raising on the right side 15 negative on the left side to 60.  Neurological: He is alert and oriented to person, place, and time.  Mild weakness in tibialis anterior group on the right side to 4+ out of 5. Absent deep tendon reflexes and patellae and Achilles on both sides. Sensation in upper extremities is intact.     Assessment/Plan Spondylosis and herniated nucleus pulposus L4-5 L5-S1 on the right side.  Plan laminotomy foraminotomy discectomy L4-5 L5-S1 right.  Ryan [name] 12/31/2013, 9:37 AM

## 2013-12-31 NOTE — Op Note (Signed)
Date of surgery: 12/31/2013 Preoperative diagnosis: Right lumbar radiculopathy L4-5 L5-S1 spondylosis possible herniated nucleus pulposus Postoperative diagnosis: Right lumbar radiculopathy L4-5 L5-S1, spondylosis possible herniated nucleus pulposus Procedure: Right laminotomy foraminotomy L4-5 and L5-S1 decompression of L4-L5 and S1 nerve roots using operating microscope microdissection technique Surgeon Kristeen Miss Asst.: Newman Pies M.D. Anesthesia: Gen. endotracheal Indications: Patient is an 80 year old individual is had significant right lumbar radiculopathy is spondylitic stenosis on the right side at L4-5 and L5-S1 with possibly an acute on chronic disc herniation he's been advised regarding surgical decompression of this region.  Procedure: Patient was brought to the operating supine on a stretcher. After the smooth induction of general endotracheal anesthesia, his turned prone. The back was prepped with alcohol and DuraPrep and draped sterilely. Midline incision was made in the lower lumbar spine and dissection was carried down to the right side. Interlaminar spaces were identified and marked and L3-L4 and L5 were identified positively on the radiograph. L4-5 then underwent dissection was laminotomy being created removing the inferior margin lamina of L4 at the mesial wall the facet. A partial medial facetectomy was performed with a high-speed drill. Yellow ligament was then taken up and the common dural tube and the L4 nerve root superiorly was decompressed as was the L5 nerve root inferiorly. The disc itself was noted to form a hard bump but no soft tissue disc herniation was encountered. This was mostly a spondylitic ridge. This contributed significantly to the articular and subarticular stenosis at the L4-5 level. With the laminotomy and foraminotomies decompressing the common dural tube and the L5 nerve root inferiorly, attention was turned to the L5-S1 level. Allograft at L5-S1  laminotomy and foraminotomies was carried out to decompress the S1 nerve root. The disc space across L5-S1 had a small similar bump which causes some lateral recess stenosis for the take off the S1 nerve root. This area was decompressed. Further inspection identified that the S1 nerve root was well decompressed now into the foramen. Hemostasis in the epidural space was obtained with bipolar cautery. Wound was irrigated copiously with saline, and then the lumbar dorsal fascia was reapproximated with #1 Vicryl, 2-0 Vicryl was used in assessing his tissues, 3-0 Vicryl to close subcuticular skin. Dermabond was then placed on the skin.

## 2013-12-31 NOTE — Anesthesia Preprocedure Evaluation (Signed)
Anesthesia Evaluation  Patient identified by MRN, date of birth, ID band Patient awake    Reviewed: Allergy & Precautions, H&P , NPO status , Patient's Chart, lab work & pertinent test results  Airway Mallampati: II   Neck ROM: full    Dental   Pulmonary former smoker,          Cardiovascular hypertension, + Peripheral Vascular Disease     Neuro/Psych  Neuromuscular disease    GI/Hepatic hiatal hernia, GERD-  ,  Endo/Other    Renal/GU      Musculoskeletal  (+) Arthritis -, Osteoarthritis,    Abdominal   Peds  Hematology  (+) anemia , lymphoma   Anesthesia Other Findings   Reproductive/Obstetrics                             Anesthesia Physical Anesthesia Plan  ASA: III  Anesthesia Plan: General   Post-op Pain Management:    Induction: Intravenous  Airway Management Planned: Oral ETT  Additional Equipment:   Intra-op Plan:   Post-operative Plan: Extubation in OR  Informed Consent: I have reviewed the patients History and Physical, chart, labs and discussed the procedure including the risks, benefits and alternatives for the proposed anesthesia with the patient or authorized representative who has indicated his/her understanding and acceptance.     Plan Discussed with: CRNA, Anesthesiologist and Surgeon  Anesthesia Plan Comments:         Anesthesia Quick Evaluation

## 2014-01-01 ENCOUNTER — Encounter (HOSPITAL_COMMUNITY): Payer: Self-pay | Admitting: Neurological Surgery

## 2014-01-01 MED ORDER — OXYCODONE-ACETAMINOPHEN 5-325 MG PO TABS: 1.0000 | ORAL_TABLET | ORAL | Status: DC | PRN

## 2014-01-01 MED ORDER — DIAZEPAM 5 MG PO TABS
5.0000 mg | ORAL_TABLET | Freq: Four times a day (QID) | ORAL | Status: DC | PRN
Start: 2014-01-01 — End: 2014-01-07

## 2014-01-01 NOTE — Progress Notes (Signed)
Utilization review completed.

## 2014-01-01 NOTE — Discharge Instructions (Signed)
Wound Care Leave incision open to air. You may shower. Do not scrub directly on incision.  Do not put any creams, lotions, or ointments on incision. Activity Walk each and every day, increasing distance each day. No lifting greater than 5 lbs.  Avoid excessive neck motion. No driving for 2 weeks; may ride as a passenger locally. Wear neck brace at all times except when showering.  If provided soft collar, may wear for comfort unless otherwise instructed. Diet Resume your normal diet.  Return to Work Will be discussed at you follow up appointment. Call Your Doctor If Any of These Occur Redness, drainage, or swelling at the wound.  Temperature greater than 101 degrees. Severe pain not relieved by pain medication. Increased difficulty swallowing. Incision starts to come apart. Follow Up Appt Call today for appointment in 4 weeks ([phone]) or for problems.  If you have any hardware placed in your spine, you will need an x-ray before your appointment.    Laminectomy - Laminotomy - Discectomy Your surgeon has decided that a laminectomy (entire lamina removal) or laminotomy (partial lamina removal) is the best treatment for your back problem. These procedures involve removal of bone to relieve pressure on nerve roots. It allows the surgeon access to parts of the spine where other problems are located. This could be an injured disc (the cartilage-like structures located between the bones of the back). In this surgery your surgeon removes a part of the boney arch that surrounds your spinal canal. This may be compressing nerve roots. In some cases, the surgeon will remove the disc and fuse (stick together) vertebral bodies (the bones of your back) to make the spine more stable. The type of procedure you will need is usually decided prior to surgery, however modifications may be necessary. The time in surgery depends on the findings in surgery and the procedure necessary to correct the  problems. DISCECTOMY For people with disc problems, the surgeon removes the portion of the disc that is causing the pressure on the nerve root. Some surgeons perform a micro (small) discectomy, which may require removal of only a small portion of the lamina. A disc nucleus (center) may also be removed either through a needle (percutaneous discectomy) or by injecting an enzyme called chymopapain into the disc. Chymopapain is an enzyme that dissolves the disc. For people with back instability, the surgeon fuses vertebrae that are next to each other with tiny pieces of bone. These are used as bone grafts on the facets, or between the vertebrae. When this heals, the bones will no longer be able to move. These bone chips are often taken from the pelvic bones. Bones and bone grafts grow into one unit, stabilizing the segments of the spinal column. LET YOUR CAREGIVER KNOW ABOUT:  Allergies.  Medicines taken including herbs, eyedrops, over-the-counter medicines, and creams.  Use of steroids (by mouth or creams).  Previous problems with anesthetics or numbing medicine.  Possibility of pregnancy, if this applies.  History of blood clots (thrombophlebitis).  History of bleeding or blood problems.  Previous surgery.  Other health problems. RISKS AND COMPLICATIONS Your caregiver will discuss possible risks and complications with you before surgery. In addition to the usual risks of anesthesia, other common risks and complications include:  Blood loss and replacement.  Temporary increase in pain due to surgery.  Uncorrected back pain.  Infection.  New nerve damage (tingling, numbness, and pain). BEFORE THE PROCEDURE  Stop smoking at least 1 week prior to surgery. This lowers  risk during surgery.  Your caregiver may advise that you stop taking certain medicines that may affect the outcome of the surgery and your ability to heal. For example, you may need to stop taking anti-inflammatories,  such as aspirin, because of possible bleeding problems. Other medicines may have interactions with anesthesia.  Tell your caregiver if you have been on steroids for long periods of time. Often, additional steroids are administered intravenously before and during the procedure to prevent complications.  You should be present 60 minutes prior to your procedure or as directed. AFTER THE PROCEDURE After surgery, you will be taken to the recovery area where a nurse will watch and check your progress. Generally, you will be allowed to go home within 1 week barring other problems. HOME CARE INSTRUCTIONS   Check the surgical cut (incision) twice a day for signs of infection. Some signs may include a bad smelling, greenish or yellowish discharge from the wound; increased pain or increased redness over the incision site; an opening of the incision; flu-like symptoms; or a temperature above 101.5 F (38.6 C).  Change your bandages in about 24 to 36 hours following surgery or as directed.  You may shower once the bandage is removed or as directed. Avoid bathtubs, swimming pools, and hot tubs for 3 weeks or until your incision has healed completely. If you have stitches (sutures) or staples they may be removed 2 to 3 weeks after surgery, or as directed by your caregiver.  Follow your caregiver's instructions for activities, exercises, and physical therapy.  Weight reduction may be beneficial if you are overweight.  Walking is permitted. You may use a treadmill without an incline. Cut down on activities if you have discomfort. You may also go up and down stairs as tolerated.  Do not lift anything heavier than 10 to 15 pounds. Avoid bending or twisting at the waist. Always bend your knees.  Maintain strength and range of motion as instructed.  No driving is permitted for 2 to 3 weeks, or as directed by your caregiver. You may be a passenger for 20 to 30 minute trips. Lying back in the passenger seat may  be more comfortable for you.  Limit your sitting to 20 to 30 minute intervals. You should lie down or walk in between sitting periods. There are no limitations for sitting in a recliner chair.  Only take over-the-counter or prescription medicines for pain, discomfort, or fever as directed by your caregiver. SEEK MEDICAL CARE IF:   There is increased bleeding (more than a small spot) from the wound.  You notice redness, swelling, or increasing pain in the wound.  Pus is coming from the wound.  An unexplained oral temperature above 102 F (38.9 C) develops.  You notice a bad smell coming from the wound or dressing. SEEK IMMEDIATE MEDICAL CARE IF:   You develop a rash.  You have difficulty breathing.  You have any allergic problems. Document Released: 01/16/2000 Document Revised: 06/04/2013 Document Reviewed: 11/14/2007 [facility] Patient Information 2015 [place], [place]. This information is not intended to replace advice given to you by your health care provider. Make sure you discuss any questions you have with your health care provider.

## 2014-01-01 NOTE — Discharge Summary (Signed)
Physician Discharge Summary  Patient ID: Ryan [name] MRN: [id number] DOB/AGE: Jun 20, 1931 80 y.o.  Admit date: 12/31/2013 Discharge date: 01/01/2014  Admission Diagnoses: Spondylosis and lumbar radiculopathy L4-5 and L5-S1 on the right  Discharge Diagnoses: Spondylosis and lumbar radiculopathy L4-5 L5-S1 on the right Active Problems:   Lumbar radiculopathy   Discharged Condition: good  Hospital Course: Patient was admitted to undergo surgical decompression at L4-5 and L5-S1 on the right. He tolerated surgery well. He is ambulatory. He has voided. Is ready for discharge.  Consults: None  Significant Diagnostic Studies: None   Treatments: surgery: Laminotomy and foraminotomies L4-5 L5-S1 right   Discharge Exam: Blood pressure 125/77, pulse 89, temperature 98 F (36.7 C), temperature source Oral, resp. rate 16, height 5' 6.75" (1.695 m), weight 83.462 kg (184 lb), SpO2 96 %. Incision is one small area of bleedthrough. Motor function is intact in lower extremities. Station and gait are intact.  Disposition: 01-Home or Self Care  Discharge Instructions    Call MD for:  redness, tenderness, or signs of infection (pain, swelling, redness, odor or green/yellow discharge around incision site)    Complete by:  As directed      Call MD for:  severe uncontrolled pain    Complete by:  As directed      Call MD for:  temperature >100.4    Complete by:  As directed      Diet - low sodium heart healthy    Complete by:  As directed      Discharge instructions    Complete by:  As directed   Okay to shower. Do not apply salves or appointments to incision. No heavy lifting with the upper extremities greater than 15 pounds. May resume driving when not requiring pain medication and patient feels comfortable with doing so.     Increase activity slowly    Complete by:  As directed             Medication List    TAKE these medications        acetaminophen 500 MG tablet  Commonly known  as:  TYLENOL  Take 500 mg by mouth every 4 (four) hours as needed (pain).     ALPRAZolam 0.25 MG tablet  Commonly known as:  XANAX  Take 0.25 mg by mouth 2 (two) times daily as needed for anxiety.     amLODipine 10 MG tablet  Commonly known as:  NORVASC  Take 1 tablet (10 mg total) by mouth every morning.     b complex vitamins capsule  Take 1 capsule by mouth daily.     benazepril-hydrochlorthiazide 20-12.5 MG per tablet  Commonly known as:  LOTENSIN HCT  take 1 tablet by mouth once daily     CHOLECALCIFEROL PO  Take 800 Units by mouth daily.     diazepam 5 MG tablet  Commonly known as:  VALIUM  Take 1 tablet (5 mg total) by mouth every 6 (six) hours as needed for anxiety.     EPIPEN JR 0.15 MG/0.3ML injection  Generic drug:  EPINEPHrine  Inject 0.15 mg into the muscle as needed. For allergic reaction     ferrous sulfate 325 (65 FE) MG tablet  Take 325 mg by mouth daily with breakfast.     finasteride 5 MG tablet  Commonly known as:  PROSCAR  Take 1 tablet (5 mg total) by mouth daily.     HYDROcodone-acetaminophen 7.5-325 MG per tablet  Commonly known as:  NORCO  Take  1 tablet by mouth at bedtime. May fill on or after 02/06/2013     ibuprofen 200 MG tablet  Commonly known as:  ADVIL,MOTRIN  Take 400 mg by mouth every 4 (four) hours as needed.     LEVSIN/SL 0.125 MG SL tablet  Generic drug:  hyoscyamine  Take 1 tablet (0.125 mg total) by mouth every 4 (four) hours as needed for cramping. Chew up fine and swallow     loratadine 10 MG tablet  Commonly known as:  CLARITIN  Take 10 mg by mouth daily as needed for allergies.     Lutein-Zeaxanthin 6-0.24 MG Caps  Take by mouth.     omeprazole 20 MG capsule  Commonly known as:  PRILOSEC  Take 20 mg by mouth daily.     oxyCODONE-acetaminophen 5-325 MG per tablet  Commonly known as:  PERCOCET/ROXICET  Take 1-2 tablets by mouth every 4 (four) hours as needed for moderate pain.     Potassium 99 MG Tabs  Take by  mouth. OTC potassium supplement     RED YEAST RICE PO  Take 1 capsule by mouth daily.     rOPINIRole 1 MG tablet  Commonly known as:  REQUIP  Take 1 tablet (1 mg total) by mouth at bedtime.     traZODone 50 MG tablet  Commonly known as:  DESYREL  Take 50 mg by mouth at bedtime as needed.     triazolam 0.25 MG tablet  Commonly known as:  HALCION  Take 1 tablet (0.25 mg total) by mouth at bedtime as needed.         SignedEarleen Newport 01/01/2014, 12:19 PM

## 2014-01-01 NOTE — Evaluation (Signed)
Physical Therapy Evaluation and Discharge Patient Details Name: Ryan [name] MRN: [id number] DOB: 1931/06/24 Today's Date: 01/01/2014   History of Present Illness  Pt is an 80 year old male s/p Right L4-L5, L5-S1 laminectomy to treat Right lumbar radiculopathy  Clinical Impression  Pt is able to perform transfers, ambulation and stairs safely with at most supervision status. Pt found to be standing at start of PT session and preferred to stand up during PT exam. Pt then able to ambulate down hall to stairs, where he was safely able to negotiate 6 steps with step-through pattern and minimal use of right railing, before ambulating back to room. Pt and wife expressed concern about going home and having necessary equipment and assistance for pt. PT answered and educated pt and wife and they expressed that they were pleased with their answers. PT provided and reviewed back precaution handout and educated patient about the importance of maintaining these precautions. Pt asked a potential brace as a reminder to not break those precautions. PT told pt to ask doctor about benefit/drawbacks to bracing. Pt is safe from a mobility stand point and has sufficient strength and endurance to get around his home as needed. Pt does not require further acute PT needs and does not require follow up PT at discharge. No goals will be written at this time.     Follow Up Recommendations No PT follow up    Equipment Recommendations  None recommended by PT    Recommendations for Other Services       Precautions / Restrictions Precautions Precautions: Fall;Back Precaution Booklet Issued: Yes (comment) Precaution Comments: Provided and reviewed precaution handout. Restrictions Weight Bearing Restrictions: No      Mobility  Bed Mobility  Pt standing up at start of session. PT talked to pt about practicing bed mobility. Pt said that he had reviewed log rolling with nursing staff and felt comfortable with  technique.                 Transfers Overall transfer level: Modified independent Equipment used: None             General transfer comment: Pt required increased time for transfers but was safe and able to perform without cuing or PT assistance.   Ambulation/Gait Ambulation/Gait assistance: Supervision Ambulation Distance (Feet): 250 Feet Assistive device: None Gait Pattern/deviations: Step-through pattern;Decreased stride length   Gait velocity interpretation: at or above normal speed for age/gender General Gait Details: Pt able to ambulate in hallway to stairs and back with supervision for safety. Pt showed no signs of fatigue and was abel to ambulate at a normal, consistent pace. Pt had no instances of loss of balance.   Stairs Stairs: Yes Stairs assistance: Supervision Stair Management: One rail Right;Alternating pattern;Forwards Number of Stairs: 6 General stair comments: Pt was safely able to negotiate stairs using no railings with acending and Right railing with descending. Pt able to perform step-through pattern and used light touch on railing coming down. Pt feels comfortable and safe being able to perform stairs at home.   Wheelchair Mobility    Modified Rankin (Stroke Patients Only)       Balance                                             Pertinent Vitals/Pain Pain Assessment: 0-10 Pain Score: 3  Pain Location: Across low  back Pain Intervention(s): Limited activity within patient's tolerance;Monitored during session;Patient requesting pain meds-RN notified    Home Living Family/patient expects to be discharged to:: Private residence Living Arrangements: Spouse/significant other Available Help at Discharge: Family;Friend(s) Type of Home: House Home Access: Stairs to enter Entrance Stairs-Rails: Right Entrance Stairs-Number of Steps: 5 Home Layout: Two level;Able to live on main level with bedroom/bathroom        Prior  Function Level of Independence: Independent               Hand Dominance        Extremity/Trunk Assessment               Lower Extremity Assessment: Overall WFL for tasks assessed         Communication   Communication: No difficulties  Cognition Arousal/Alertness: Awake/alert Behavior During Therapy: WFL for tasks assessed/performed Overall Cognitive Status: Within Functional Limits for tasks assessed                      General Comments General comments (skin integrity, edema, etc.): Pt and wife concerned about having right equipment, maintaining precautions and being able to follow their normal routine once home. PT educated patient on back precautions and how to maintain those precautions with his normal, daily activities at home. Pt and wife stated they were pleased with this education.     Exercises        Assessment/Plan    PT Assessment Patent does not need any further PT services  PT Diagnosis Acute pain;Generalized weakness   PT Problem List    PT Treatment Interventions     PT Goals (Current goals can be found in the Care Plan section) Acute Rehab PT Goals Patient Stated Goal: Return to normal function PT Goal Formulation: With patient Time For Goal Achievement: 01/15/14 Potential to Achieve Goals: Good    Frequency     Barriers to discharge        Co-evaluation               End of Session Equipment Utilized During Treatment: Gait belt Activity Tolerance: Patient tolerated treatment well Patient left: in bed;with call bell/phone within reach;with family/visitor present Nurse Communication: Mobility status;Patient requests pain meds         Time: 0842-0915 PT Time Calculation (min) (ACUTE ONLY): 33 min   Charges:   PT Evaluation $Initial PT Evaluation Tier I: 1 Procedure PT Treatments $Gait Training: 8-22 mins $Self Care/Home Management: 8-22   PT G CodesJearld Shines, SPT 01/01/2014, 10:56  AM  Jearld Shines, SPT  Acute Rehabilitation [phone]

## 2014-01-01 NOTE — Progress Notes (Signed)
Pt doing well. Pt and wife given D/C instructions with Rx's, verbal understanding was provided. Pt's incision is covered with gauze dressing which was changed by Dr. Ellene Route prior to D/C. Pt's IV was removed prior to D/C. Pt D/C'd home via wheelchair @ 1250 per MD order. Pt is stable @ D/C and has no other needs at this time. Holli Humbles, RN

## 2014-01-01 NOTE — Evaluation (Signed)
Occupational Therapy Evaluation Patient Details Name: Ryan [name] MRN: [id number] DOB: [birth year] Today's Date: 01/01/2014    History of Present Illness Pt is an 80 year old male s/p Right L4-L5, L5-S1 laminectomy to treat Right lumbar radiculopathy   Clinical Impression   Pt s/p above. Education provided to pt and spouse and handout in room. Feel pt is safe to d/c home with spouse available to assist.     Follow Up Recommendations  No OT follow up;Supervision/Assistance - Intermittent   Equipment Recommendations  None recommended by OT    Recommendations for Other Services       Precautions / Restrictions Precautions Precautions: Fall;Back Precaution Booklet Issued: Yes (comment) Precaution Comments: reviewed precautions Restrictions Weight Bearing Restrictions: No      Mobility Bed Mobility Overal bed mobility: Needs Assistance Bed Mobility: Rolling;Sidelying to Sit;Sit to Sidelying Rolling: Supervision Sidelying to sit: Min guard;Supervision     Sit to sidelying: Min guard;Supervision General bed mobility comments: cues for log roll technique.  Transfers Overall transfer level: Needs assistance Equipment used: None Transfers: Sit to/from Stand Sit to Stand: Supervision         General transfer comment: Discussed transfer technique.    Balance                                            ADL Overall ADL's : Needs assistance/impaired                     Lower Body Dressing: Sit to/from stand;Min assist;With adaptive equipment;Cueing for back precautions   Toilet Transfer: Supervision/safety;Ambulation (bed)           Functional mobility during ADLs: Supervision/safety General ADL Comments: Educated on use of cup for oral care and placement of grooming items to avoid breaking precautions. Educated on safety (safe shoewear, sitting for most of LB ADLs, pet, rugs). Recommended spouse be with pt for shower transfer.  Discussed incorporating back precautions into functional activities. Discussed positioning of pillows. Educated on what pt could use for toilet aide. Educated on AE for LB ADLs-pt able to cross legs (OT told pt if he is having to lift more than 5 pounds then to use AE).  Pt practiced with reacher in session.     Vision                     Perception     Praxis      Pertinent Vitals/Pain Pain Assessment: 0-10 Pain Score:  (reports less than 5) Pain Location: back Pain Intervention(s): Monitored during session;Repositioned     Hand Dominance Right   Extremity/Trunk Assessment Upper Extremity Assessment Upper Extremity Assessment: Overall WFL for tasks assessed   Lower Extremity Assessment Lower Extremity Assessment: Defer to PT evaluation       Communication Communication Communication: No difficulties   Cognition Arousal/Alertness: Awake/alert Behavior During Therapy: WFL for tasks assessed/performed Overall Cognitive Status: Within Functional Limits for tasks assessed                     General Comments       Exercises       Shoulder Instructions      Home Living Family/patient expects to be discharged to:: Private residence Living Arrangements: Spouse/significant other Available Help at Discharge: Family;Friend(s) Type of Home: House Home Access: Stairs to enter [employer]  of Steps: 5 Entrance Stairs-Rails: Right Home Layout: Two level;Able to live on main level with bedroom/bathroom     Bathroom Shower/Tub: Occupational psychologist: Handicapped height     Home Equipment: [place];Shower seat - built Education officer, museum;Other (Comment) (long brush)        Prior Functioning/Environment Level of Independence: Independent             OT Diagnosis: Acute pain   OT Problem List:     OT Treatment/Interventions:      OT Goals(Current goals can be found in the care plan  section) Acute Rehab OT Goals Patient Stated Goal: Return to normal function  OT Frequency:     Barriers to D/C:            Co-evaluation              End of Session    Activity Tolerance: Patient tolerated treatment well Patient left: in bed;with family/visitor present   Time: 1031-1101 OT Time Calculation (min): 30 min Charges:  OT General Charges $OT Visit: 1 Procedure OT Evaluation $Initial OT Evaluation Tier I: 1 Procedure OT Treatments $Self Care/Home Management : 8-22 mins G-CodesBenito Mccreedy OTR/L [phone] 01/01/2014, 11:15 AM

## 2014-01-04 ENCOUNTER — Emergency Department (HOSPITAL_COMMUNITY): Payer: Medicare HMO

## 2014-01-04 ENCOUNTER — Encounter (HOSPITAL_COMMUNITY): Payer: Self-pay | Admitting: Nurse Practitioner

## 2014-01-04 ENCOUNTER — Inpatient Hospital Stay (HOSPITAL_COMMUNITY)
Admission: EM | Admit: 2014-01-04 | Discharge: 2014-01-07 | DRG: 292 | Disposition: A | Discharge status: Home Health | Payer: Medicare HMO | Attending: Family Medicine | Admitting: Family Medicine

## 2014-01-04 DIAGNOSIS — E739 Lactose intolerance, unspecified: Secondary | ICD-10-CM | POA: Diagnosis present

## 2014-01-04 DIAGNOSIS — Z8572 Personal history of non-Hodgkin lymphomas: Secondary | ICD-10-CM

## 2014-01-04 DIAGNOSIS — D509 Iron deficiency anemia, unspecified: Secondary | ICD-10-CM | POA: Diagnosis present

## 2014-01-04 DIAGNOSIS — R0902 Hypoxemia: Secondary | ICD-10-CM | POA: Diagnosis not present

## 2014-01-04 DIAGNOSIS — E876 Hypokalemia: Secondary | ICD-10-CM | POA: Diagnosis present

## 2014-01-04 DIAGNOSIS — N400 Enlarged prostate without lower urinary tract symptoms: Secondary | ICD-10-CM | POA: Diagnosis present

## 2014-01-04 DIAGNOSIS — G4700 Insomnia, unspecified: Secondary | ICD-10-CM | POA: Diagnosis present

## 2014-01-04 DIAGNOSIS — M545 Low back pain: Secondary | ICD-10-CM | POA: Diagnosis present

## 2014-01-04 DIAGNOSIS — M62838 Other muscle spasm: Secondary | ICD-10-CM | POA: Diagnosis present

## 2014-01-04 DIAGNOSIS — C8586 Other specified types of non-Hodgkin lymphoma, intrapelvic lymph nodes: Secondary | ICD-10-CM | POA: Diagnosis present

## 2014-01-04 DIAGNOSIS — I10 Essential (primary) hypertension: Secondary | ICD-10-CM | POA: Diagnosis present

## 2014-01-04 DIAGNOSIS — R339 Retention of urine, unspecified: Secondary | ICD-10-CM | POA: Diagnosis present

## 2014-01-04 DIAGNOSIS — G8929 Other chronic pain: Secondary | ICD-10-CM | POA: Diagnosis present

## 2014-01-04 DIAGNOSIS — C8590 Non-Hodgkin lymphoma, unspecified, unspecified site: Secondary | ICD-10-CM | POA: Diagnosis present

## 2014-01-04 DIAGNOSIS — I5033 Acute on chronic diastolic (congestive) heart failure: Secondary | ICD-10-CM | POA: Diagnosis not present

## 2014-01-04 DIAGNOSIS — Z8042 Family history of malignant neoplasm of prostate: Secondary | ICD-10-CM

## 2014-01-04 DIAGNOSIS — E785 Hyperlipidemia, unspecified: Secondary | ICD-10-CM | POA: Diagnosis present

## 2014-01-04 DIAGNOSIS — M5442 Lumbago with sciatica, left side: Secondary | ICD-10-CM | POA: Diagnosis present

## 2014-01-04 DIAGNOSIS — G4733 Obstructive sleep apnea (adult) (pediatric): Secondary | ICD-10-CM | POA: Diagnosis present

## 2014-01-04 DIAGNOSIS — Z823 Family history of stroke: Secondary | ICD-10-CM

## 2014-01-04 DIAGNOSIS — Z85820 Personal history of malignant melanoma of skin: Secondary | ICD-10-CM

## 2014-01-04 DIAGNOSIS — K449 Diaphragmatic hernia without obstruction or gangrene: Secondary | ICD-10-CM | POA: Diagnosis present

## 2014-01-04 DIAGNOSIS — I5032 Chronic diastolic (congestive) heart failure: Secondary | ICD-10-CM

## 2014-01-04 DIAGNOSIS — F5104 Psychophysiologic insomnia: Secondary | ICD-10-CM | POA: Diagnosis present

## 2014-01-04 DIAGNOSIS — K2270 Barrett's esophagus without dysplasia: Secondary | ICD-10-CM | POA: Diagnosis present

## 2014-01-04 DIAGNOSIS — Z8249 Family history of ischemic heart disease and other diseases of the circulatory system: Secondary | ICD-10-CM

## 2014-01-04 DIAGNOSIS — K5730 Diverticulosis of large intestine without perforation or abscess without bleeding: Secondary | ICD-10-CM | POA: Diagnosis present

## 2014-01-04 DIAGNOSIS — K5900 Constipation, unspecified: Secondary | ICD-10-CM | POA: Diagnosis present

## 2014-01-04 DIAGNOSIS — Z86010 Personal history of colonic polyps: Secondary | ICD-10-CM

## 2014-01-04 DIAGNOSIS — R609 Edema, unspecified: Secondary | ICD-10-CM | POA: Diagnosis present

## 2014-01-04 DIAGNOSIS — K219 Gastro-esophageal reflux disease without esophagitis: Secondary | ICD-10-CM | POA: Diagnosis present

## 2014-01-04 DIAGNOSIS — G2581 Restless legs syndrome: Secondary | ICD-10-CM | POA: Diagnosis present

## 2014-01-04 DIAGNOSIS — Z87891 Personal history of nicotine dependence: Secondary | ICD-10-CM

## 2014-01-04 DIAGNOSIS — K900 Celiac disease: Secondary | ICD-10-CM | POA: Diagnosis present

## 2014-01-04 LAB — URINALYSIS, ROUTINE W REFLEX MICROSCOPIC
Bilirubin Urine: NEGATIVE
Glucose, UA: NEGATIVE mg/dL
HGB URINE DIPSTICK: NEGATIVE
Ketones, ur: NEGATIVE mg/dL
Leukocytes, UA: NEGATIVE
NITRITE: NEGATIVE
PROTEIN: NEGATIVE mg/dL
Specific Gravity, Urine: 1.018 (ref 1.005–1.030)
UROBILINOGEN UA: 1 mg/dL (ref 0.0–1.0)
pH: 5.5 (ref 5.0–8.0)

## 2014-01-04 NOTE — ED Notes (Signed)
MD at bedside. Dr. Maryan Rued at bedside.

## 2014-01-04 NOTE — ED Provider Notes (Addendum)
CSN: [id number]     Arrival date & time 01/04/14  2028 History   First MD Initiated Contact with Patient 01/04/14 2122     No chief complaint on file.    (Consider location/radiation/quality/duration/timing/severity/associated sxs/prior Treatment) HPI Comments: Patient with recent back surgery 4 days ago who since surgery as had worsening issues with constipation. He took multiple medications without alleviation of his constipation until today he took magnesium citrate and had a extremely large bowel movement just prior to arrival. Also for the last 7-8 hours he's had difficulty urinating. He is only able to produce a very small amount of urine and has persistent symptoms of frequency, urgency and bladder pressure. He denies any focal numbness, weakness or leg pain. Prior to the surgery he was having back and leg pain which has improved. He denies any bowel incontinence.  Patient denies shortness of breath, fever, nausea, vomiting. Today he has had decreased appetite that he feels it's just because he has not been able to have bowel movements and is not hungry. Also he has chronic lower extremity edema which has worsened since surgery which he attributes to the fact that he has not been as active. He also has noted scrotal swelling.  The history is provided by the patient and the spouse.    Past Medical History  Diagnosis Date  . Hyperlipemia   . Hypertension   . Benign prostatic hypertrophy   . Chronic fatigue   . Lactose intolerance   . Sleep disorder     DOES NOT SLEEP WELL  . Personal history of colonic polyps 2004    hyperplastic Dr. Collene Mares  . Gluten intolerance   . Barrett's esophagus   . Esophageal reflux   . Diverticulosis of colon (without mention of hemorrhage)   . Periaortic lymphadenopathy 02/16/2011  . Atelectasis, bilateral 02/10/11    Noted On CT Scan - Mild Dependent Atelectasis at the Lung Bases  . Hiatal hernia 02/10/11    Noted on CT Scan - Moderate Hiatal Hernia  . Renal  cyst 02/10/11     Noted on CT Scan - Bilateral Renal Cysts  . S/P radiation therapy 03/15/11 - 04/09/11    Abdominal/ Pelvic Tumor, 3600 cGy/20 Fractions  . Microcytic anemia 04/20/2011   . Abdominal pain     Bloating and gas  . Gastritis   . Melanoma     lymphoma  . 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 Dysrhythmia     SKIPPED HEART BEAT SOMETIMES  . Environmental allergies   . Pain     LOWER BACK PAIN - DDD, SCOLIOSIS, BONE SPUR.  FINISHED THE 3RD EPIDURAL STEROID INJECTION 7/15 - DONE BY DR. Nelva Bush. - STILL HAVING BACK PAIN  . Osteoarthritis     hands/feet,knees, NECK, BACK  . Congestion of throat     mouth breath at night, dry mouth   Past Surgical History  Procedure Laterality Date  . Rotator cuff repair      right  . Cataract extraction, bilateral    . Tonsillectomy    . Arthroscopic repair acl      right  . Bone marrow aspiration  02/25/11    Bone Marrow, Aspirate, Clot, and Bilateral Bx, Right PIC  . Hernia repair      LEFT INGUINAL   . Ventral hernia repair N/A 08/23/2012  Procedure: HERNIA REPAIR VENTRAL ADULT;  Surgeon: Madilyn Hook, DO;  Location: [facility];  Service: General;  Laterality: N/A;  . Insertion of mesh N/A 08/23/2012    Procedure: INSERTION OF MESH;  Surgeon: Madilyn Hook, DO;  Location: [facility];  Service: General;  Laterality: N/A;  . Tonsillectomy    . Eye surgery    . Lumbar laminectomy/decompression microdiscectomy Right 12/31/2013    Procedure: RIGHT L4-5 L5-S1 LAMINECTOMY;  Surgeon: Kristeen Miss, MD;  Location: [facility];  Service: Neurosurgery;  Laterality: Right;  RIGHT L4-5 L5-S1 LAMINECTOMY   Family History  Problem Relation Age of Onset  . Heart disease Father     MI 51  . Prostate cancer Brother   . Prostate cancer Paternal Uncle   . Prostate cancer Paternal Uncle   . Prostate cancer Paternal Uncle    . Colon cancer Neg Hx   . Hyperlipidemia    . Stroke    . Hypertension    . ADD / ADHD     History  Substance Use Topics  . Smoking status: Former Smoker -- 1.00 packs/day for 35 years    Types: Pipe, Cigarettes    Quit date: 02/23/1992  . Smokeless tobacco: Never Used     Comment: quit 20 years ago  . Alcohol Use: Yes     Comment: 2 drinks daily scotch  AND WINE WITH SUPPER    Review of Systems  Constitutional: Positive for appetite change. Negative for fever.  Cardiovascular: Positive for leg swelling.  Genitourinary: Positive for urgency, frequency, decreased urine volume, scrotal swelling and difficulty urinating.  Musculoskeletal: Positive for back pain.       Unchanged back pain since surgery  Neurological: Negative for weakness and numbness.  All other systems reviewed and are negative.     Allergies  Molds & smuts and Pollen extract-tree extract  Home Medications   Prior to Admission medications   Medication Sig Start Date End Date Taking? Authorizing Provider  acetaminophen (TYLENOL) 500 MG tablet Take 500 mg by mouth every 4 (four) hours as needed (pain).    Yes Historical Provider, MD  ALPRAZolam (XANAX) 0.25 MG tablet Take 0.125-0.25 mg by mouth 2 (two) times daily as needed for anxiety.    Yes Historical Provider, MD  amLODipine (NORVASC) 10 MG tablet Take 1 tablet (10 mg total) by mouth every morning. 03/21/13  Yes Neena Rhymes, MD  b complex vitamins capsule Take 1 capsule by mouth daily.   Yes Historical Provider, MD  benazepril-hydrochlorthiazide (LOTENSIN HCT) 20-12.5 MG per tablet take 1 tablet by mouth once daily 01/17/13  Yes Neena Rhymes, MD  CHOLECALCIFEROL PO Take 800 Units by mouth daily.    Yes Historical Provider, MD  diazepam (VALIUM) 5 MG tablet Take 1 tablet (5 mg total) by mouth every 6 (six) hours as needed for anxiety. 01/01/14   Kristeen Miss, MD  EPINEPHrine (EPIPEN JR) 0.15 MG/0.3ML injection Inject 0.15 mg into the muscle as  needed. For allergic reaction    Historical Provider, MD  ferrous sulfate 325 (65 FE) MG tablet Take 325 mg by mouth daily with breakfast.    Historical Provider, MD  finasteride (PROSCAR) 5 MG tablet Take 1 tablet (5 mg total) by mouth daily. 12/25/12   Neena Rhymes, MD  HYDROcodone-acetaminophen (NORCO) 7.5-325 MG per tablet Take 1 tablet by mouth at bedtime. May fill on or after 02/06/2013 01/29/13   Neena Rhymes, MD  ibuprofen (ADVIL,MOTRIN) 200 MG tablet Take 400  mg by mouth every 4 (four) hours as needed.     Historical Provider, MD  LEVSIN/SL 0.125 MG SL tablet Take 1 tablet (0.125 mg total) by mouth every 4 (four) hours as needed for cramping. Chew up fine and swallow 09/26/12   Neena Rhymes, MD  loratadine (CLARITIN) 10 MG tablet Take 10 mg by mouth daily as needed for allergies.     Historical Provider, MD  Lutein-Zeaxanthin 6-0.24 MG CAPS Take by mouth.    Historical Provider, MD  omeprazole (PRILOSEC) 20 MG capsule Take 20 mg by mouth daily.    Historical Provider, MD  oxyCODONE-acetaminophen (PERCOCET/ROXICET) 5-325 MG per tablet Take 1-2 tablets by mouth every 4 (four) hours as needed for moderate pain. 01/01/14   Kristeen Miss, MD  Potassium 99 MG TABS Take by mouth. OTC potassium supplement    Historical Provider, MD  Red Yeast Rice Extract (RED YEAST RICE PO) Take 1 capsule by mouth daily.      Historical Provider, MD  rOPINIRole (REQUIP) 1 MG tablet Take 1 tablet (1 mg total) by mouth at bedtime. 02/12/13   Neena Rhymes, MD  traZODone (DESYREL) 50 MG tablet Take 50 mg by mouth at bedtime as needed. 06/12/13   Historical Provider, MD  triazolam (HALCION) 0.25 MG tablet Take 1 tablet (0.25 mg total) by mouth at bedtime as needed. 11/06/12   Neena Rhymes, MD   BP 149/88 mmHg  Pulse 98  Temp(Src) 99 F (37.2 C) (Oral)  Resp 16  SpO2 87% Physical Exam  Constitutional: He is oriented to person, place, and time. He appears well-developed and well-nourished. He appears  distressed.  Appears uncomfortable  HENT:  Head: Normocephalic and atraumatic.  Mouth/Throat: Oropharynx is clear and moist.  Eyes: Conjunctivae and EOM are normal. Pupils are equal, round, and reactive to light.  Neck: Normal range of motion. Neck supple.  Cardiovascular: Regular rhythm and intact distal pulses.  Tachycardia present.   No murmur heard. Pulmonary/Chest: Effort normal. No respiratory distress. He has no wheezes. He has rales in the left lower field.  Abdominal: Soft. He exhibits distension. There is tenderness in the suprapubic area. There is no rebound and no guarding.  Genitourinary: Rectal exam shows anal tone normal.  Scrotal edema  Musculoskeletal: Normal range of motion. He exhibits edema. He exhibits no tenderness.  Midline lumbar incision, clean, dry and intact without evidence of erythema or drainage.  2+ lower ext edema bilaterally  Neurological: He is alert and oriented to person, place, and time. He has normal strength. No sensory deficit.  5/5 strength in lower ext bilaterally and normal sensation  Skin: Skin is warm and dry. No rash noted. No erythema.  Psychiatric: He has a normal mood and affect. His behavior is normal.  Nursing note and vitals reviewed.   ED Course  Procedures (including critical care time) Labs Review Labs Reviewed  URINE CULTURE  URINALYSIS, ROUTINE W REFLEX MICROSCOPIC  CBC WITH DIFFERENTIAL  COMPREHENSIVE METABOLIC PANEL  PRO B NATRIURETIC PEPTIDE  I-STAT [place], ED    Imaging Review Dg Chest 1 View  01/05/2014   CLINICAL DATA:  Hypoxia  EXAM: CHEST - 1 VIEW  COMPARISON:  01/01/2012  FINDINGS: Chronic interstitial markings. No focal consolidation. Mild patchy left basilar opacity, likely atelectasis. No pleural effusion or pneumothorax. Mild eventration of the right hemidiaphragm.  Cardiomegaly.  IMPRESSION: No evidence of acute cardiopulmonary disease.   Electronically Signed   By: Julian Hy M.D.   On: 01/05/2014  00:06     Date: 01/05/2014  Rate: 103  Rhythm: sinus tachycardia  QRS Axis: normal  Intervals: normal  ST/T Wave abnormalities: nonspecific T wave changes  Conduction Disutrbances:none  Narrative Interpretation:   Old EKG Reviewed: none available    MDM   Final diagnoses:  None    Patient here status post lumbar decompression and laminectomy approximately 4 days ago who presents tonight with persistent severe constipation and urinary retention. Just prior to arrival patient had a very large bowel movement after taking magnesium citrate however urinary retention has not improved.  Patient denies any new numbness, weakness or pain in the lower extremities. He has a prior history of prostate enlargement.  Patient denies fever, shortness of breath, chest pain, abdominal pain. Has not had much of an appetite today.  Patient symptoms are resolved after placement of Foley catheter. Patient had 1 L output of urine. UA is negative for signs of infection. Gave patient and wife strict return precautions for any weakness, numbness or neurologic symptoms. Feel that this is most likely related to constipation, surgery and recent catheterization. Low suspicion that this is related to a neurologic problem from surgery as normal neuro exam and normal rectal tone.   Secondly pt has been persistent hypoxic here.  He denies SOB however pt O2 sats dropping to 79% on RA and appears slightly dusky.  With diffuse edema concern for possible CHF vs atelectasis from recent surgery and pain meds.  CXR, CBC, CMP, trop, BNP pending.  Blanchie Dessert, MD 01/04/14 9702  Blanchie Dessert, MD 01/05/14 [phone]

## 2014-01-04 NOTE — ED Notes (Signed)
Patient transported to X-ray 

## 2014-01-04 NOTE — ED Notes (Signed)
Pt presents from home, c/o of post op(Lumber surgery) related problems including; Urinary retention, Constipation(but had a large BM PTA), scrotal edema, bilateral LE 2+ edema. Rates pain 7/10.

## 2014-01-04 NOTE — ED Notes (Signed)
MD at bedside. Dr. Plunkett at bedside.  

## 2014-01-05 ENCOUNTER — Observation Stay (HOSPITAL_COMMUNITY): Payer: Medicare HMO

## 2014-01-05 DIAGNOSIS — N400 Enlarged prostate without lower urinary tract symptoms: Secondary | ICD-10-CM

## 2014-01-05 DIAGNOSIS — E876 Hypokalemia: Secondary | ICD-10-CM

## 2014-01-05 DIAGNOSIS — C8590 Non-Hodgkin lymphoma, unspecified, unspecified site: Secondary | ICD-10-CM

## 2014-01-05 DIAGNOSIS — G2581 Restless legs syndrome: Secondary | ICD-10-CM

## 2014-01-05 DIAGNOSIS — R0902 Hypoxemia: Secondary | ICD-10-CM

## 2014-01-05 DIAGNOSIS — I10 Essential (primary) hypertension: Secondary | ICD-10-CM

## 2014-01-05 DIAGNOSIS — R339 Retention of urine, unspecified: Secondary | ICD-10-CM

## 2014-01-05 LAB — D-DIMER, QUANTITATIVE: D-Dimer, Quant: 0.97 ug/mL-FEU — ABNORMAL HIGH (ref 0.00–0.48)

## 2014-01-05 LAB — CBC WITH DIFFERENTIAL/PLATELET
Basophils Absolute: 0 10*3/uL (ref 0.0–0.1)
Basophils Relative: 0 % (ref 0–1)
Eosinophils Absolute: 0 10*3/uL (ref 0.0–0.7)
Eosinophils Relative: 0 % (ref 0–5)
HCT: 41.4 % (ref 39.0–52.0)
HEMOGLOBIN: 14 g/dL (ref 13.0–17.0)
LYMPHS ABS: 1 10*3/uL (ref 0.7–4.0)
Lymphocytes Relative: 10 % — ABNORMAL LOW (ref 12–46)
MCH: 32.5 pg (ref 26.0–34.0)
MCHC: 33.8 g/dL (ref 30.0–36.0)
MCV: 96.1 fL (ref 78.0–100.0)
MONOS PCT: 6 % (ref 3–12)
Monocytes Absolute: 0.6 10*3/uL (ref 0.1–1.0)
NEUTROS ABS: 8.8 10*3/uL — AB (ref 1.7–7.7)
Neutrophils Relative %: 84 % — ABNORMAL HIGH (ref 43–77)
Platelets: 237 10*3/uL (ref 150–400)
RBC: 4.31 MIL/uL (ref 4.22–5.81)
RDW: 12.9 % (ref 11.5–15.5)
WBC: 10.4 10*3/uL (ref 4.0–10.5)

## 2014-01-05 LAB — COMPREHENSIVE METABOLIC PANEL
ALT: 24 U/L (ref 0–53)
ANION GAP: 16 — AB (ref 5–15)
AST: 35 U/L (ref 0–37)
Albumin: 3.7 g/dL (ref 3.5–5.2)
Alkaline Phosphatase: 101 U/L (ref 39–117)
BILIRUBIN TOTAL: 0.9 mg/dL (ref 0.3–1.2)
BUN: 20 mg/dL (ref 6–23)
CO2: 31 mEq/L (ref 19–32)
Calcium: 8.9 mg/dL (ref 8.4–10.5)
Chloride: 91 mEq/L — ABNORMAL LOW (ref 96–112)
Creatinine, Ser: 0.93 mg/dL (ref 0.50–1.35)
GFR calc Af Amer: 90 mL/min — ABNORMAL LOW (ref >90)
GFR calc non Af Amer: 77 mL/min — ABNORMAL LOW (ref >90)
GLUCOSE: 89 mg/dL (ref 70–99)
Potassium: 3.1 mEq/L — ABNORMAL LOW (ref 3.7–5.3)
Sodium: 138 mEq/L (ref 137–147)
Total Protein: 6.7 g/dL (ref 6.0–8.3)

## 2014-01-05 LAB — I-STAT TROPONIN, ED: Troponin i, poc: 0.01 ng/mL (ref 0.00–0.08)

## 2014-01-05 LAB — PRO B NATRIURETIC PEPTIDE: Pro B Natriuretic peptide (BNP): 413.7 pg/mL (ref 0–450)

## 2014-01-05 MED ORDER — SENNA 8.6 MG PO TABS
1.0000 | ORAL_TABLET | Freq: Two times a day (BID) | ORAL | Status: DC
  Administered 2014-01-05 – 2014-01-06 (×4): 8.6 mg via ORAL
  Filled 2014-01-05 (×2): qty 1

## 2014-01-05 MED ORDER — POTASSIUM 99 MG PO TABS: 99.0000 mg | ORAL_TABLET | Freq: Every day | ORAL | Status: DC

## 2014-01-05 MED ORDER — OXYCODONE-ACETAMINOPHEN 5-325 MG PO TABS
1.0000 | ORAL_TABLET | ORAL | Status: DC | PRN
  Administered 2014-01-05 – 2014-01-07 (×8): 2 via ORAL
  Filled 2014-01-05 (×7): qty 2

## 2014-01-05 MED ORDER — FUROSEMIDE 10 MG/ML IJ SOLN
20.0000 mg | Freq: Two times a day (BID) | INTRAMUSCULAR | Status: DC
  Administered 2014-01-05 – 2014-01-07 (×5): 20 mg via INTRAVENOUS
  Filled 2014-01-05 (×4): qty 2

## 2014-01-05 MED ORDER — ONDANSETRON HCL 4 MG/2ML IJ SOLN: 4.0000 mg | Freq: Four times a day (QID) | INTRAMUSCULAR | Status: DC | PRN

## 2014-01-05 MED ORDER — IBUPROFEN 200 MG PO TABS: 400.0000 mg | ORAL_TABLET | ORAL | Status: DC | PRN

## 2014-01-05 MED ORDER — BENAZEPRIL-HYDROCHLOROTHIAZIDE 20-12.5 MG PO TABS: 1.0000 | ORAL_TABLET | Freq: Every day | ORAL | Status: DC

## 2014-01-05 MED ORDER — TRIAZOLAM 0.125 MG PO TABS: 0.2500 mg | ORAL_TABLET | Freq: Every evening | ORAL | Status: DC | PRN

## 2014-01-05 MED ORDER — TRAZODONE HCL 50 MG PO TABS
100.0000 mg | ORAL_TABLET | Freq: Every evening | ORAL | Status: DC | PRN
  Administered 2014-01-06: 100 mg via ORAL
  Filled 2014-01-05: qty 2

## 2014-01-05 MED ORDER — FLUTICASONE PROPIONATE 50 MCG/ACT NA SUSP
1.0000 | Freq: Every day | NASAL | Status: DC | PRN
  Filled 2014-01-05: qty 16

## 2014-01-05 MED ORDER — CETYLPYRIDINIUM CHLORIDE 0.05 % MT LIQD
7.0000 mL | Freq: Two times a day (BID) | OROMUCOSAL | Status: DC
  Administered 2014-01-05 – 2014-01-06 (×3): 7 mL via OROMUCOSAL

## 2014-01-05 MED ORDER — ALPRAZOLAM 0.25 MG PO TABS
0.1250 mg | ORAL_TABLET | Freq: Two times a day (BID) | ORAL | Status: DC | PRN
  Administered 2014-01-05 – 2014-01-06 (×2): 0.25 mg via ORAL
  Filled 2014-01-05 (×2): qty 1

## 2014-01-05 MED ORDER — AMLODIPINE BESYLATE 10 MG PO TABS
10.0000 mg | ORAL_TABLET | Freq: Every morning | ORAL | Status: DC
  Administered 2014-01-05 – 2014-01-06 (×2): 10 mg via ORAL
  Filled 2014-01-05 (×3): qty 1

## 2014-01-05 MED ORDER — POLYETHYLENE GLYCOL 3350 17 G PO PACK
17.0000 g | PACK | Freq: Every day | ORAL | Status: DC | PRN
  Administered 2014-01-05: 17 g via ORAL
  Filled 2014-01-05 (×2): qty 1

## 2014-01-05 MED ORDER — SALINE SPRAY 0.65 % NA SOLN
1.0000 | Freq: Every day | NASAL | Status: DC
  Administered 2014-01-05 – 2014-01-06 (×2): 1 via NASAL
  Filled 2014-01-05: qty 44

## 2014-01-05 MED ORDER — IOHEXOL 350 MG/ML SOLN
100.0000 mL | Freq: Once | INTRAVENOUS | Status: AC | PRN
  Administered 2014-01-05: 100 mL via INTRAVENOUS

## 2014-01-05 MED ORDER — POTASSIUM CHLORIDE CRYS ER 10 MEQ PO TBCR
10.0000 meq | EXTENDED_RELEASE_TABLET | Freq: Every day | ORAL | Status: DC
  Administered 2014-01-05 – 2014-01-06 (×2): 10 meq via ORAL
  Filled 2014-01-05: qty 1

## 2014-01-05 MED ORDER — TRAZODONE HCL 50 MG PO TABS: 50.0000 mg | ORAL_TABLET | Freq: Every evening | ORAL | Status: DC | PRN

## 2014-01-05 MED ORDER — POTASSIUM CHLORIDE 10 MEQ/100ML IV SOLN
10.0000 meq | INTRAVENOUS | Status: AC
  Administered 2014-01-05 (×3): 10 meq via INTRAVENOUS
  Filled 2014-01-05 (×3): qty 100

## 2014-01-05 MED ORDER — ROPINIROLE HCL 1 MG PO TABS
1.0000 mg | ORAL_TABLET | Freq: Every day | ORAL | Status: DC
  Administered 2014-01-05 – 2014-01-06 (×2): 1 mg via ORAL
  Filled 2014-01-05 (×3): qty 1

## 2014-01-05 MED ORDER — ONDANSETRON HCL 4 MG PO TABS: 4.0000 mg | ORAL_TABLET | Freq: Four times a day (QID) | ORAL | Status: DC | PRN

## 2014-01-05 MED ORDER — BENAZEPRIL HCL 20 MG PO TABS
20.0000 mg | ORAL_TABLET | Freq: Every day | ORAL | Status: DC
  Filled 2014-01-05: qty 1

## 2014-01-05 MED ORDER — CYCLOBENZAPRINE HCL 5 MG PO TABS
5.0000 mg | ORAL_TABLET | Freq: Three times a day (TID) | ORAL | Status: DC
  Administered 2014-01-05 – 2014-01-06 (×5): 5 mg via ORAL
  Filled 2014-01-05 (×9): qty 1

## 2014-01-05 MED ORDER — HYDROCHLOROTHIAZIDE 12.5 MG PO CAPS
12.5000 mg | ORAL_CAPSULE | Freq: Every day | ORAL | Status: DC
  Filled 2014-01-05: qty 1

## 2014-01-05 MED ORDER — HEPARIN SODIUM (PORCINE) 5000 UNIT/ML IJ SOLN
5000.0000 [IU] | Freq: Three times a day (TID) | INTRAMUSCULAR | Status: DC
  Administered 2014-01-05 – 2014-01-07 (×7): 5000 [IU] via SUBCUTANEOUS
  Filled 2014-01-05 (×9): qty 1

## 2014-01-05 NOTE — Progress Notes (Signed)
Subjective: Patient admitted this morning, see details of the H&P by Dr. Marily Memos. Patient admitted with constipation, urinary retention and hypoxia. CT angiogram was negative for PE. Patient does have a history of diastolic dysfunction. BNP was elevated to 413.7 Filed Vitals:   01/05/14 0549  BP: 123/67  Pulse: 65  Temp: 98.6 F (37 C)  Resp: 18    Chest: Faint crackles at the lung bases Heart : S1S2 RRR Abdomen: Soft, nontender Ext : No edema Neuro: Alert, oriented x 3  A/P  Hypoxia- resolved, likely due to the acute on chronic diastolic CHF. We'll start the patient on Lasix 20 daily grams IV every 12 hours. Follow BMP in a.m. Continue oxygen therapy via nasal cannula as needed   Constipation- resolved after patient took max treated home.  Urinary retention- likely due to the above, currently patient has Foley catheter. Will try voiding trial in a.m.\\  Status post laminectomy- patient underwent laminectomy 5 days ago, continue pain control with Percocet  Hypokalemia- replace potassium and check BMP in a.m.   [place] Hospitalist [phone]

## 2014-01-05 NOTE — ED Notes (Signed)
Attempted to call report to 4 E. RN unable to take report at this time.

## 2014-01-05 NOTE — Progress Notes (Signed)
Pt unsure if he voided when trying to have BM earlier.  Bladder scan performed showing 300.  Per protocol will encourage patient to void on own. Will rescan in 2 hours. Ryan [name] A

## 2014-01-05 NOTE — Progress Notes (Signed)
With encouragement pt able to void 100 ml. Will continue to monitor. Ryan [name]

## 2014-01-05 NOTE — Evaluation (Signed)
Physical Therapy Evaluation Patient Details Name: Ryan [name] MRN: [id number] DOB: [birth year] Today's Date: 01/05/2014   History of Present Illness  Pt is an 80 year old male  adm with hypoxemia; s/p Right L4-L5, L5-S1 5days prior to adm;  Clinical Impression  Pt will benefit from PT to address deficits below; He really may benefit from SNF, he reports wife can't physically assist him at home at all, wife not present at time of eval; He reports he is not considering SNF at this time;  Will continue to follow and assess needs    Follow Up Recommendations Home health PT;SNF    Equipment Recommendations  Rolling walker with 5" wheels    Recommendations for Other Services       Precautions / Restrictions Precautions Precautions: Fall;Back Precaution Comments: pt verbalizes  1 of 3 back precautions      Mobility  Bed Mobility Overal bed mobility: Needs Assistance Bed Mobility: Rolling;Sidelying to Sit Rolling: Supervision Sidelying to sit: Min assist       General bed mobility comments: cues for log roll technique.  Transfers Overall transfer level: Needs assistance Equipment used: None;Rolling walker (2 wheeled) Transfers: Sit to/from Stand Sit to Stand: Min assist         General transfer comment: cues for safety and  hand  placement, posture and use of LEs  Ambulation/Gait Ambulation/Gait assistance: Min guard Ambulation Distance (Feet): 25 Feet Assistive device: Rolling walker (2 wheeled) Gait Pattern/deviations: Antalgic;Step-to pattern;Decreased step length - right;Decreased step length - left;Decreased stance time - right   Gait velocity interpretation: Below normal speed for age/gender General Gait Details: verbal cues for step length and RW safety, posture, very slow gait speed  Stairs            Wheelchair Mobility    Modified Rankin (Stroke Patients Only)       Balance Overall balance assessment: Needs assistance         Standing  balance support: Bilateral upper extremity supported Standing balance-Leahy Scale: Poor                               Pertinent Vitals/Pain Pain Assessment: No/denies pain Pain Score: 4  Pain Location: back and RLE Pain Descriptors / Indicators: Jabbing Pain Intervention(s): Limited activity within patient's tolerance;Monitored during session;Premedicated before session    Home Living Family/patient expects to be discharged to:: Private residence Living Arrangements: Spouse/significant other Available Help at Discharge: Family;Friend(s) Type of Home: House Home Access: Stairs to enter Entrance Stairs-Rails: Right Entrance Stairs-Number of Steps: 5 Home Layout: Two level;Able to live on main level with bedroom/bathroom Home Equipment: Shower seat;Shower seat - built in;Adaptive equipment;Walker - Psychologist, educational - 4 wheels      Prior Function Level of Independence: Independent               Hand Dominance        Extremity/Trunk Assessment   Upper Extremity Assessment: Overall WFL for tasks assessed           Lower Extremity Assessment: RLE deficits/detail RLE Deficits / Details: grossly 3+ to 4/5       Communication   Communication: No difficulties  Cognition Arousal/Alertness: Lethargic;Suspect due to medications Behavior During Therapy: [facility] for tasks assessed/performed Overall Cognitive Status: Within Functional Limits for tasks assessed                      General Comments  Exercises        Assessment/Plan    PT Assessment Patient needs continued PT services  PT Diagnosis Difficulty walking   PT Problem List Decreased strength;Decreased activity tolerance;Decreased mobility;Decreased knowledge of precautions;Decreased knowledge of use of DME  PT Treatment Interventions DME instruction;Gait training;Functional mobility training;Therapeutic activities;Therapeutic exercise;Patient/family education   PT Goals (Current  goals can be found in the Care Plan section) Acute Rehab PT Goals Patient Stated Goal: Return to normal function PT Goal Formulation: With patient Time For Goal Achievement: 01/12/14 Potential to Achieve Goals: Good    Frequency Min 3X/week   Barriers to discharge        Co-evaluation               End of Session Equipment Utilized During Treatment: Gait belt Activity Tolerance: Patient limited by fatigue;Patient limited by pain Patient left: in chair;with call bell/phone within reach Nurse Communication: Mobility status    Functional Assessment Tool Used: clinical observation Functional Limitation: Mobility: Walking and moving around Mobility: Walking and Moving Around Current Status ([id number]): At least 1 percent but less than 20 percent impaired, limited or restricted Mobility: Walking and Moving Around Goal Status [id number]): At least 1 percent but less than 20 percent impaired, limited or restricted    Time: 8485-9276 PT Time Calculation (min) (ACUTE ONLY): 27 min   Charges:   PT Evaluation $Initial PT Evaluation Tier I: 1 Procedure PT Treatments $Gait Training: 23-37 mins   PT G Codes:   Functional Assessment Tool Used: clinical observation Functional Limitation: Mobility: Walking and moving around    [facility] 01/05/2014, 5:08 PM

## 2014-01-05 NOTE — ED Notes (Addendum)
Pt taking his own Percocet 5/325 x 2. Hospitalist aware.

## 2014-01-05 NOTE — Progress Notes (Signed)
Utilization Review completed.

## 2014-01-05 NOTE — H&P (Signed)
[facility] History and Physical  Ryan [name] [id number]:[id number] DOB: Apr 13, 1931 DOA: 01/04/2014  Referring physician: Lane PCP: Chesley Noon, MD   Chief Complaint: urinary retention  HPI: Ryan [name] is a 80 y.o. male  Minimal urinary output today. Feels sence of frequency, urgency and bladder pressure. Reports having laminectomuy 4 days ago and struggling with constipation since that time. Last BM before today on 11/130. Mag Citrate finally allowed the pt to have a BM.  Pt was evaluated in the ED and was about to be sent home when he continued to desaturate into the mid 80s. Nadir 79. Pt put on O2 w/ correction. Denies any SOB, CP, palpitations. Endorses baseline LE edema but acutely worse over the past week. Improves w/ rest and elevation.    Review of Systems:  Constitutional: No weight loss, night sweats, Fevers, chills, fatigue.  HEENT:  No headaches, Difficulty swallowing,Tooth/dental problems,Sore throat,  No sneezing, itching, ear ache, nasal congestion, post nasal drip,  Cardio-vascular:  No chest pain, Orthopnea, PND, dizziness, palpitations  GI:  No heartburn, indigestion, abdominal pain, nausea, vomiting, diarrhea, change in bowel habits, loss of appetite  Resp:  No shortness of breath with exertion or at rest. No excess mucus, no productive cough, No non-productive cough, No coughing up of blood.No change in color of mucus.No wheezing.No chest wall deformity  Skin:  no rash or lesions.  GU:  Per HPI  Musculoskeletal:  No joint pain or swelling. No decreased range of motion. No back pain.  Psych:  No change in mood or affect. No depression or anxiety. No memory loss.   Past Medical History  Diagnosis Date  . Hyperlipemia   . Hypertension   . Benign prostatic hypertrophy   . Chronic fatigue   . Lactose intolerance   . Sleep disorder     DOES NOT SLEEP WELL  . Personal history of colonic polyps 2004    hyperplastic Dr. Collene Mares  .  Gluten intolerance   . Barrett's esophagus   . Esophageal reflux   . Diverticulosis of colon (without mention of hemorrhage)   . Periaortic lymphadenopathy 02/16/2011  . Atelectasis, bilateral 02/10/11    Noted On CT Scan - Mild Dependent Atelectasis at the Lung Bases  . Hiatal hernia 02/10/11    Noted on CT Scan - Moderate Hiatal Hernia  . Renal cyst 02/10/11     Noted on CT Scan - Bilateral Renal Cysts  . S/P radiation therapy 03/15/11 - 04/09/11    Abdominal/ Pelvic Tumor, 3600 cGy/20 Fractions  . Microcytic anemia 04/20/2011   . Abdominal pain     Bloating and gas  . Gastritis   . Melanoma     lymphoma  . 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 Dysrhythmia     SKIPPED HEART BEAT SOMETIMES  . Environmental allergies   . Pain     LOWER BACK PAIN - DDD, SCOLIOSIS, BONE SPUR.  FINISHED THE 3RD EPIDURAL STEROID INJECTION 7/15 - DONE BY DR. Nelva Bush. - STILL HAVING BACK PAIN  . Osteoarthritis     hands/feet,knees, NECK, BACK  . Congestion of throat     mouth breath at night, dry mouth   Past Surgical History  Procedure Laterality Date  . Rotator cuff repair      right  . Cataract extraction, bilateral    .  Tonsillectomy    . Arthroscopic repair acl      right  . Bone marrow aspiration  02/25/11    Bone Marrow, Aspirate, Clot, and Bilateral Bx, Right PIC  . Hernia repair      LEFT INGUINAL   . Ventral hernia repair N/A 08/23/2012    Procedure: HERNIA REPAIR VENTRAL ADULT;  Surgeon: Madilyn Hook, DO;  Location: [facility];  Service: General;  Laterality: N/A;  . Insertion of mesh N/A 08/23/2012    Procedure: INSERTION OF MESH;  Surgeon: Madilyn Hook, DO;  Location: [facility];  Service: General;  Laterality: N/A;  . Tonsillectomy    . Eye surgery    . Lumbar laminectomy/decompression microdiscectomy Right 12/31/2013    Procedure: RIGHT L4-5 L5-S1 LAMINECTOMY;   Surgeon: Kristeen Miss, MD;  Location: [facility];  Service: Neurosurgery;  Laterality: Right;  RIGHT L4-5 L5-S1 LAMINECTOMY   Social History:  reports that he quit smoking about 21 years ago. His smoking use included Pipe and Cigarettes. He has a 35 pack-year smoking history. He has never used smokeless tobacco. He reports that he drinks alcohol. He reports that he does not use illicit drugs.  Allergies  Allergen Reactions  . Molds & Smuts Other (See Comments)    Also dust mites causes sinus infections, h/a etc.  . Pollen Extract-Tree Extract Other (See Comments)    HEADACHES, TIRED , DRAINAGE FROM SINUSES    Family History  Problem Relation Age of Onset  . Heart disease Father     MI 29  . Prostate cancer Brother   . Prostate cancer Paternal Uncle   . Prostate cancer Paternal Uncle   . Prostate cancer Paternal Uncle   . Colon cancer Neg Hx   . Hyperlipidemia    . Stroke    . Hypertension    . ADD / ADHD       Prior to Admission medications   Medication Sig Start Date End Date Taking? Authorizing Provider  acetaminophen (TYLENOL) 500 MG tablet Take 500 mg by mouth every 4 (four) hours as needed (pain).    Yes Historical Provider, MD  ALPRAZolam (XANAX) 0.25 MG tablet Take 0.125-0.25 mg by mouth 2 (two) times daily as needed for anxiety.    Yes Historical Provider, MD  amLODipine (NORVASC) 10 MG tablet Take 1 tablet (10 mg total) by mouth every morning. 03/21/13  Yes Neena Rhymes, MD  b complex vitamins capsule Take 1 capsule by mouth daily.   Yes Historical Provider, MD  benazepril-hydrochlorthiazide (LOTENSIN HCT) 20-12.5 MG per tablet take 1 tablet by mouth once daily 01/17/13  Yes Neena Rhymes, MD  CHOLECALCIFEROL PO Take 800 Units by mouth daily.    Yes Historical Provider, MD  diazepam (VALIUM) 5 MG tablet Take 1 tablet (5 mg total) by mouth every 6 (six) hours as needed for anxiety. 01/01/14  Yes Kristeen Miss, MD  EPINEPHrine (EPIPEN JR) 0.15 MG/0.3ML injection  Inject 0.15 mg into the muscle daily as needed for anaphylaxis. For allergic reaction   Yes Historical Provider, MD  ferrous sulfate 325 (65 FE) MG tablet Take 325 mg by mouth daily with breakfast.   Yes Historical Provider, MD  finasteride (PROSCAR) 5 MG tablet Take 1 tablet (5 mg total) by mouth daily. 12/25/12  Yes Neena Rhymes, MD  fluticasone (FLONASE) 50 MCG/ACT nasal spray Place 1 spray into both nostrils daily as needed for allergies or rhinitis.   Yes Historical Provider, MD  HYDROcodone-acetaminophen (NORCO) 7.5-325 MG per  tablet Take 1 tablet by mouth at bedtime. May fill on or after 02/06/2013 Patient taking differently: Take 1 tablet by mouth every 6 (six) hours as needed for severe pain. May fill on or after 02/06/2013 01/29/13  Yes Neena Rhymes, MD  ibuprofen (ADVIL,MOTRIN) 200 MG tablet Take 400 mg by mouth every 4 (four) hours as needed for moderate pain.    Yes Historical Provider, MD  LEVSIN/SL 0.125 MG SL tablet Take 1 tablet (0.125 mg total) by mouth every 4 (four) hours as needed for cramping. Chew up fine and swallow 09/26/12  Yes Neena Rhymes, MD  loratadine (CLARITIN) 10 MG tablet Take 10 mg by mouth daily as needed for allergies.    Yes Historical Provider, MD  Lutein-Zeaxanthin 6-0.24 MG CAPS Take 1 capsule by mouth daily.    Yes Historical Provider, MD  omeprazole (PRILOSEC) 20 MG capsule Take 20 mg by mouth daily.   Yes Historical Provider, MD  oxyCODONE-acetaminophen (PERCOCET/ROXICET) 5-325 MG per tablet Take 1-2 tablets by mouth every 4 (four) hours as needed for moderate pain. 01/01/14  Yes Kristeen Miss, MD  Potassium 99 MG TABS Take 99 mg by mouth daily. OTC potassium supplement   Yes Historical Provider, MD  Red Yeast Rice Extract (RED YEAST RICE PO) Take 2 capsules by mouth daily.    Yes Historical Provider, MD  rOPINIRole (REQUIP) 1 MG tablet Take 1 tablet (1 mg total) by mouth at bedtime. 02/12/13  Yes Neena Rhymes, MD  sodium chloride (OCEAN) 0.65 %  SOLN nasal spray Place 1 spray into both nostrils daily.   Yes Historical Provider, MD  traZODone (DESYREL) 50 MG tablet Take 50 mg by mouth at bedtime as needed. 06/12/13  Yes Historical Provider, MD  triazolam (HALCION) 0.25 MG tablet Take 1 tablet (0.25 mg total) by mouth at bedtime as needed. Patient taking differently: Take 0.25 mg by mouth at bedtime as needed for sleep.  11/06/12  Yes Neena Rhymes, MD   Physical Exam: Filed Vitals:   01/04/14 2032 01/04/14 2248 01/05/14 0006 01/05/14 0142  BP: 149/88 140/90  133/87  Pulse: 98 82  93  Temp: 99 F (37.2 C) 98.2 F (36.8 C)  97.9 F (36.6 C)  TempSrc: Oral Oral  Oral  Resp: _0 SpO2: 87% 93% 92% 92%    Wt Readings from Last 3 Encounters:  12/31/13 83.462 kg (184 lb)  12/25/13 83.462 kg (184 lb)  12/19/13 83.099 kg (183 lb 3.2 oz)    General:  Appears calm and comfortable Eyes:  PERRL, normal lids, irises & conjunctiva ENT: Very Dry mm  Neck:  no LAD, masses or thyromegaly Cardiovascular:  RRR, II/VI systolic murmur. RLE 2+ edema and LLE 1+ edema Telemetry:  SR, no arrhythmias  Respiratory: CTA bilaterally, no w/r/r. Normal respiratory effort. Abdomen:  soft, ntnd Skin:  no rash or induration seen on limited exam Musculoskeletal:  grossly normal tone BUE/BLE Psychiatric:  grossly normal mood and affect, speech fluent and appropriate Neurologic:  grossly non-focal.          Labs on Admission:  Basic Metabolic Panel:  Recent Labs Lab 01/04/14 2310  NA 138  K 3.1*  CL 91*  CO2 31  GLUCOSE 89  BUN 20  CREATININE 0.93  CALCIUM 8.9   Liver Function Tests:  Recent Labs Lab 01/04/14 2310  AST 35  ALT 24  ALKPHOS 101  BILITOT 0.9  PROT 6.7  ALBUMIN 3.7   No results  for input(s): LIPASE, AMYLASE in the last 168 hours. No results for input(s): AMMONIA in the last 168 hours. CBC:  Recent Labs Lab 01/04/14 2310  WBC 10.4  NEUTROABS 8.8*  HGB 14.0  HCT 41.4  MCV 96.1  PLT 237   Cardiac  Enzymes: No results for input(s): CKTOTAL, CKMB, CKMBINDEX, TROPONINI in the last 168 hours.  BNP (last 3 results) No results for input(s): PROBNP in the last 8760 hours. CBG: No results for input(s): GLUCAP in the last 168 hours.  Radiological Exams on Admission: Dg Chest 1 View  01/05/2014   CLINICAL DATA:  Hypoxia  EXAM: CHEST - 1 VIEW  COMPARISON:  01/01/2012  FINDINGS: Chronic interstitial markings. No focal consolidation. Mild patchy left basilar opacity, likely atelectasis. No pleural effusion or pneumothorax. Mild eventration of the right hemidiaphragm.  Cardiomegaly.  IMPRESSION: No evidence of acute cardiopulmonary disease.   Electronically Signed   By: Julian Hy M.D.   On: 01/05/2014 00:06    EKG: NOt performed by ED  Assessment/Plan Principal Problem:   Hypoxemia Active Problems:   INSOMNIA, CHRONIC   BPH (benign prostatic hyperplasia)   NHL (non-Hodgkin's lymphoma)   RLS (restless legs syndrome)   Essential hypertension   Hypokalemia   Urinary retention  Hypoxemia: likely underlying OSA w/ possible PE. Pt desated to 82 during my exam off O2, while asleep. Pt w/o symptoms of SOB when desating. Pt likely desaturates at home as this causes him no distress. Baseline LE edema worse since surgery especially on R. Former smoker w/ 34 pack years. No evidence of PNA, CHF exacerbation, or COPD exacerbation.  CXR nml.  - D-dimer - CTA - O2 PRN - If + consider LE/Abd [place]  Chronic diastolic CHF: 37/3428 EF 76-811 and Grade 2 diastolic dysfunction.  - BNP  HTN: stable and nml - continue home Benazepril and HCTZZ  Urinary retention: likely from flow obstruction and severe constipation. 1L in bladder upon insertion of foley. Allow decompression - voiding trial in 24 hours  S/P Laminonectomy: post op day 5. Doing well overall. Still requiring scheduled percocet - continue home pain medications - PT/OT.  COnstipation: likely opioid induced - Senna and miralax  PRN  Restless leg: nightly issue - continue requip  Insomnia: chronic and ongoing - Continue home Trazodone,   Hypokalemia: - bmet in am - Kdur 3mq x 2   Code Status: FULL DVT Prophylaxis: hep Family Communication: wife Disposition Plan: pending improvement  Time spent: >70 min  [facility] [email or web address] Password [email or web address]

## 2014-01-05 NOTE — Plan of Care (Signed)
Problem: Phase I Progression Outcomes Goal: Voiding-avoid urinary catheter unless indicated Outcome: Not Met (add Reason) Pt had foley placed due to urinary retention  Goal: Hemodynamically stable Outcome: Completed/Met Date Met:  01/05/14 Goal: Other Phase I Outcomes/Goals Outcome: Completed/Met Date Met:  01/05/14

## 2014-01-06 DIAGNOSIS — Z8042 Family history of malignant neoplasm of prostate: Secondary | ICD-10-CM | POA: Diagnosis not present

## 2014-01-06 DIAGNOSIS — N400 Enlarged prostate without lower urinary tract symptoms: Secondary | ICD-10-CM | POA: Diagnosis present

## 2014-01-06 DIAGNOSIS — Z823 Family history of stroke: Secondary | ICD-10-CM | POA: Diagnosis not present

## 2014-01-06 DIAGNOSIS — K449 Diaphragmatic hernia without obstruction or gangrene: Secondary | ICD-10-CM | POA: Diagnosis present

## 2014-01-06 DIAGNOSIS — I10 Essential (primary) hypertension: Secondary | ICD-10-CM | POA: Diagnosis present

## 2014-01-06 DIAGNOSIS — G8929 Other chronic pain: Secondary | ICD-10-CM | POA: Diagnosis present

## 2014-01-06 DIAGNOSIS — K219 Gastro-esophageal reflux disease without esophagitis: Secondary | ICD-10-CM | POA: Diagnosis present

## 2014-01-06 DIAGNOSIS — C8586 Other specified types of non-Hodgkin lymphoma, intrapelvic lymph nodes: Secondary | ICD-10-CM | POA: Diagnosis present

## 2014-01-06 DIAGNOSIS — Z87891 Personal history of nicotine dependence: Secondary | ICD-10-CM | POA: Diagnosis not present

## 2014-01-06 DIAGNOSIS — E739 Lactose intolerance, unspecified: Secondary | ICD-10-CM | POA: Diagnosis present

## 2014-01-06 DIAGNOSIS — K2270 Barrett's esophagus without dysplasia: Secondary | ICD-10-CM | POA: Diagnosis present

## 2014-01-06 DIAGNOSIS — R339 Retention of urine, unspecified: Secondary | ICD-10-CM | POA: Diagnosis present

## 2014-01-06 DIAGNOSIS — I5033 Acute on chronic diastolic (congestive) heart failure: Principal | ICD-10-CM

## 2014-01-06 DIAGNOSIS — E785 Hyperlipidemia, unspecified: Secondary | ICD-10-CM | POA: Diagnosis present

## 2014-01-06 DIAGNOSIS — D509 Iron deficiency anemia, unspecified: Secondary | ICD-10-CM | POA: Diagnosis present

## 2014-01-06 DIAGNOSIS — Z86010 Personal history of colonic polyps: Secondary | ICD-10-CM | POA: Diagnosis not present

## 2014-01-06 DIAGNOSIS — Z8572 Personal history of non-Hodgkin lymphomas: Secondary | ICD-10-CM | POA: Diagnosis not present

## 2014-01-06 DIAGNOSIS — K900 Celiac disease: Secondary | ICD-10-CM | POA: Diagnosis present

## 2014-01-06 DIAGNOSIS — I5032 Chronic diastolic (congestive) heart failure: Secondary | ICD-10-CM

## 2014-01-06 DIAGNOSIS — G2581 Restless legs syndrome: Secondary | ICD-10-CM | POA: Diagnosis present

## 2014-01-06 DIAGNOSIS — M545 Low back pain: Secondary | ICD-10-CM | POA: Diagnosis present

## 2014-01-06 DIAGNOSIS — R609 Edema, unspecified: Secondary | ICD-10-CM

## 2014-01-06 DIAGNOSIS — Z85820 Personal history of malignant melanoma of skin: Secondary | ICD-10-CM | POA: Diagnosis not present

## 2014-01-06 DIAGNOSIS — K5730 Diverticulosis of large intestine without perforation or abscess without bleeding: Secondary | ICD-10-CM | POA: Diagnosis present

## 2014-01-06 DIAGNOSIS — K5900 Constipation, unspecified: Secondary | ICD-10-CM | POA: Diagnosis present

## 2014-01-06 DIAGNOSIS — M62838 Other muscle spasm: Secondary | ICD-10-CM | POA: Diagnosis present

## 2014-01-06 DIAGNOSIS — F5104 Psychophysiologic insomnia: Secondary | ICD-10-CM | POA: Diagnosis present

## 2014-01-06 DIAGNOSIS — R0902 Hypoxemia: Secondary | ICD-10-CM | POA: Diagnosis present

## 2014-01-06 DIAGNOSIS — E876 Hypokalemia: Secondary | ICD-10-CM | POA: Diagnosis present

## 2014-01-06 DIAGNOSIS — Z8249 Family history of ischemic heart disease and other diseases of the circulatory system: Secondary | ICD-10-CM | POA: Diagnosis not present

## 2014-01-06 DIAGNOSIS — G4733 Obstructive sleep apnea (adult) (pediatric): Secondary | ICD-10-CM | POA: Diagnosis present

## 2014-01-06 LAB — BASIC METABOLIC PANEL
Anion gap: 12 (ref 5–15)
BUN: 15 mg/dL (ref 6–23)
CHLORIDE: 93 meq/L — AB (ref 96–112)
CO2: 32 meq/L (ref 19–32)
Calcium: 9 mg/dL (ref 8.4–10.5)
Creatinine, Ser: 0.94 mg/dL (ref 0.50–1.35)
GFR calc Af Amer: 89 mL/min — ABNORMAL LOW (ref >90)
GFR calc non Af Amer: 77 mL/min — ABNORMAL LOW (ref >90)
GLUCOSE: 99 mg/dL (ref 70–99)
Potassium: 3.6 mEq/L — ABNORMAL LOW (ref 3.7–5.3)
SODIUM: 137 meq/L (ref 137–147)

## 2014-01-06 LAB — URINE CULTURE
Colony Count: NO GROWTH
Culture: NO GROWTH

## 2014-01-06 MED ORDER — POTASSIUM CHLORIDE 10 MEQ/100ML IV SOLN: 10.0000 meq | INTRAVENOUS | Status: DC

## 2014-01-06 MED ORDER — POTASSIUM CHLORIDE CRYS ER 20 MEQ PO TBCR: 20.0000 meq | EXTENDED_RELEASE_TABLET | Freq: Every day | ORAL | Status: DC

## 2014-01-06 MED ORDER — OXYCODONE HCL ER 10 MG PO T12A
10.0000 mg | EXTENDED_RELEASE_TABLET | Freq: Two times a day (BID) | ORAL | Status: DC
  Administered 2014-01-06 (×2): 10 mg via ORAL
  Filled 2014-01-06 (×2): qty 1

## 2014-01-06 MED ORDER — LORAZEPAM 1 MG PO TABS
2.0000 mg | ORAL_TABLET | Freq: Once | ORAL | Status: DC
  Filled 2014-01-06: qty 2

## 2014-01-06 NOTE — Progress Notes (Signed)
[facility] PROGRESS NOTE  Ryan [name] [id number]:[id number] DOB: Nov 07, 1931 DOA: 01/04/2014 PCP: Chesley Noon, MD  Assessment/Plan:  Hypoxia/ acute on chronic diastolic heart failure- resolved, likely due to the acute on chronic diastolic CHF. Patient's last echo from November 2014 showed EF 60-65%, with grade 2 diastolic dysfunction.  Patient was started on  Lasix 20 daily grams IV every 12 hours. Follow BMP in a.m. Continue oxygen therapy via nasal cannula as needed.  Constipation- resolved after patient took magnesium citrate at home.  Urinary retention- resolved, likely due to constipation. Foley catheter has been discontinued.  Status post laminectomy/chronic low back pain- patient underwent laminectomy 5 days ago, continue pain control with Percocet. We'll start OxyContin 10 mg by mouth every 12 hours for longer acting pain management. We'll also continue with Percocet 1-2 tabs every 4 hours when necessary for breakthrough pain.  Hypokalemia- Replace potassium and check BMP in a.m.  DVT prophylaxis- heparin  Code Status: Full code Family Communication: Discussed with wife at bedside Disposition Plan: Home when stable   Consultants:  None  Procedures:  None  Antibiotics:  None  HPI/Subjective: 80 y.o. male with  minimal urinary output today. Feels sence of frequency, urgency and bladder pressure. Reports having laminectomuy 4 days ago and struggling with constipation since that time. Last BM before today on 11/130. Mag Citrate finally allowed the pt to have a BM. Pt was evaluated in the ED and was about to be sent home when he continued to desaturate into the mid 80s. Nadir 79. Pt put on O2 w/ correction. Denies any SOB, CP, palpitations. Endorses baseline LE edema but acutely worse over the past week. Improves w/ rest and elevation.   Patient is breathing better after he was given Lasix yesterday. Diuresed with output of 3 L yesterday. He is not requiring  oxygen this morning. O2 sats of 94- 95% on room air. Patient also was started on Flexeril 5 mg by mouth 3 times a day for the back muscle spasm, would like more scheduled dozes of oxycodone.    Objective: Filed Vitals:   01/06/14 0453  BP: 143/93  Pulse: 89  Temp: 97.7 F (36.5 C)  Resp: 18    Intake/Output Summary (Last 24 hours) at 01/06/14 1012 Last data filed at 01/06/14 0900  Gross per 24 hour  Intake    940 ml  Output   3750 ml  Net  -2810 ml   Filed Weights   01/05/14 0142  Weight: 86.002 kg (189 lb 9.6 oz)    Exam:  Physical Exam: Eyes: No icterus, extraocular muscles intact  Lungs: Normal respiratory effort, clear to auscultation bilaterally Heart: Regular rate and rhythm, S1 and S2 normal, no murmurs, rubs auscultated Abdomen: BS normoactive,soft,nondistended,non-tender to palpation,no organomegaly Extremities:  no erythema, no cyanosis, no clubbing, trace edema bilaterally Neuro : Alert and oriented to time, place and person, No focal deficits  Skin: No rashes seen on exam   Data Reviewed: Basic Metabolic Panel:  Recent Labs Lab 01/04/14 2310  NA 138  K 3.1*  CL 91*  CO2 31  GLUCOSE 89  BUN 20  CREATININE 0.93  CALCIUM 8.9   Liver Function Tests:  Recent Labs Lab 01/04/14 2310  AST 35  ALT 24  ALKPHOS 101  BILITOT 0.9  PROT 6.7  ALBUMIN 3.7   No results for input(s): LIPASE, AMYLASE in the last 168 hours. No results for input(s): AMMONIA in the last 168 hours. CBC:  Recent Labs Lab 01/04/14 2310  WBC 10.4  NEUTROABS 8.8*  HGB 14.0  HCT 41.4  MCV 96.1  PLT 237   Cardiac Enzymes: No results for input(s): CKTOTAL, CKMB, CKMBINDEX, TROPONINI in the last 168 hours. BNP (last 3 results)  Recent Labs  01/05/14 0320  PROBNP 413.7   CBG: No results for input(s): GLUCAP in the last 168 hours.  Recent Results (from the past 240 hour(s))  Urine culture     Status: None   Collection Time: 01/04/14  9:56 PM  Result Value Ref  Range Status   Specimen Description URINE, RANDOM  Final   Special Requests NONE  Final   Culture  Setup Time   Final    01/05/2014 01:12 Performed at [place] Performed at [facility]   Final   Culture NO GROWTH Performed at [facility]   Final   Report Status 01/06/2014 FINAL  Final     Studies: Dg Chest 1 View  01/05/2014   CLINICAL DATA:  Hypoxia  EXAM: CHEST - 1 VIEW  COMPARISON:  01/01/2012  FINDINGS: Chronic interstitial markings. No focal consolidation. Mild patchy left basilar opacity, likely atelectasis. No pleural effusion or pneumothorax. Mild eventration of the right hemidiaphragm.  Cardiomegaly.  IMPRESSION: No evidence of acute cardiopulmonary disease.   Electronically Signed   By: Julian Hy M.D.   On: 01/05/2014 00:06   Ct Angio Chest Pe W/cm &/or Wo Cm  01/05/2014   CLINICAL DATA:  Acute onset of decreased O2 saturation and shortness of breath. Bilateral lower extremity edema for 1 week. Initial encounter.  EXAM: CT ANGIOGRAPHY CHEST WITH CONTRAST  TECHNIQUE: Multidetector CT imaging of the chest was performed using the standard protocol during bolus administration of intravenous contrast. Multiplanar CT image reconstructions and MIPs were obtained to evaluate the vascular anatomy.  CONTRAST:  146m OMNIPAQUE IOHEXOL 350 MG/ML SOLN  COMPARISON:  Chest radiograph performed 01/04/2014, and CT of the chest performed 06/20/2013  FINDINGS: There is no evidence of pulmonary embolus.  Bibasilar atelectasis or scarring is noted. Two lymph nodes are seen along the right minor fissure. There is unusual prominence of the soft tissues at both hila, which may reflect mildly enlarged hilar nodes, grossly stable from the prior study, measuring up to 1.3 cm in short axis. There is no evidence of significant focal consolidation, pleural effusion or pneumothorax.  The mediastinum is unremarkable in appearance. No mediastinal  lymphadenopathy is seen. The great vessels are grossly unremarkable in appearance. No pericardial effusion is identified. No axillary lymphadenopathy is seen. The visualized portions of the thyroid gland are unremarkable in appearance.  A 1.0 cm hypodensity within the right hepatic lobe is relatively stable in appearance and may reflect a cyst, though nonspecific in appearance. The spleen is unremarkable. A small amount of fluid is noted adjacent to the spleen. Note is made of a very large paraesophageal hernia, containing the entirety of the stomach.  No acute osseous abnormalities are seen. There appears to be chronic resorption or absence of the distal right clavicle, with associated degenerative change.  Review of the MIP images confirms the above findings.  IMPRESSION: 1. No evidence of pulmonary embolus. 2. Bibasilar atelectasis or scarring noted.  Lungs otherwise clear. 3. Mildly prominent bilateral hilar nodes, measuring up to 1.3 cm in short axis, relatively stable from the prior study. 4. Very large paraesophageal hernia again noted, containing the entirety of the stomach. 5. Stable 1.0 cm hypodensity within the right hepatic  lobe may reflect a cyst, though nonspecific in appearance. 6. Trace free fluid noted adjacent to the spleen. 7. Chronic resorption or absence of the distal right clavicle, with associated degenerative change.   Electronically Signed   By: Garald Balding M.D.   On: 01/05/2014 05:01    Scheduled Meds: . amLODipine  10 mg Oral q morning - 10a  . antiseptic oral rinse  7 mL Mouth Rinse BID  . cyclobenzaprine  5 mg Oral TID  . furosemide  20 mg Intravenous Q12H  . heparin  5,000 Units Subcutaneous 3 times per day  . LORazepam  2 mg Oral Once  . OxyCODONE  10 mg Oral Q12H  . [START ON 01/07/2014] potassium chloride  20 mEq Oral Daily  . rOPINIRole  1 mg Oral QHS  . senna  1 tablet Oral BID  . sodium chloride  1 spray Each Nare Daily   Continuous Infusions:   Principal  Problem:   Hypoxemia Active Problems:   INSOMNIA, CHRONIC   BPH (benign prostatic hyperplasia)   NHL (non-Hodgkin's lymphoma)   RLS (restless legs syndrome)   Essential hypertension   Hypokalemia   Urinary retention   Hypoxia    Time spent: *25 min    [facility] Pager [phone]. If 7PM-7AM, please contact night-coverage at [email or web address], password [facility] 01/06/2014, 10:12 AM  LOS: 2 days

## 2014-01-06 NOTE — Plan of Care (Signed)
Problem: Phase III Progression Outcomes Goal: Pain controlled on oral analgesia Outcome: Completed/Met Date Met:  01/06/14 Goal: Activity at appropriate level-compared to baseline (UP IN CHAIR FOR HEMODIALYSIS)  Outcome: Completed/Met Date Met:  01/06/14 Goal: Discharge plan remains appropriate-arrangements made Outcome: Completed/Met Date Met:  01/06/14

## 2014-01-06 NOTE — Progress Notes (Signed)
UR completed.

## 2014-01-06 NOTE — Plan of Care (Signed)
Problem: Phase I Progression Outcomes Goal: Voiding-avoid urinary catheter unless indicated Outcome: Completed/Met Date Met:  01/06/14  Problem: Phase III Progression Outcomes Goal: Voiding independently Outcome: Completed/Met Date Met:  01/06/14 Goal: Foley discontinued Outcome: Completed/Met Date Met:  01/06/14

## 2014-01-07 MED ORDER — MORPHINE SULFATE ER 15 MG PO TBCR: 15.0000 mg | EXTENDED_RELEASE_TABLET | Freq: Two times a day (BID) | ORAL | Status: DC

## 2014-01-07 MED ORDER — SENNA 8.6 MG PO TABS: 1.0000 | ORAL_TABLET | Freq: Two times a day (BID) | ORAL | Status: DC

## 2014-01-07 MED ORDER — CYCLOBENZAPRINE HCL 5 MG PO TABS: 5.0000 mg | ORAL_TABLET | Freq: Three times a day (TID) | ORAL | Status: DC

## 2014-01-07 MED ORDER — IBUPROFEN 200 MG PO TABS: 400.0000 mg | ORAL_TABLET | ORAL | Status: DC | PRN

## 2014-01-07 MED ORDER — TRIAZOLAM 0.125 MG PO TABS: 0.2500 mg | ORAL_TABLET | Freq: Every evening | ORAL | Status: DC | PRN

## 2014-01-07 MED ORDER — OXYCODONE HCL ER 10 MG PO T12A: 10.0000 mg | EXTENDED_RELEASE_TABLET | Freq: Two times a day (BID) | ORAL | Status: DC

## 2014-01-07 NOTE — Care Management Note (Signed)
    Page 1 of 1   01/07/2014     12:06:50 PM CARE MANAGEMENT NOTE 01/07/2014  Patient:  Ryan [name], Ryan [name]   Account Number:  [id number]  Date Initiated:  01/05/2014  Documentation initiated by:  Dessa Phi  Subjective/Objective Assessment:   [place] W/DESATS,CONSTIPATION-S/P LAMINONECTOMY-D/C HOME 21/1/15.     Action/Plan:   FROM HOME.   Anticipated DC Date:  01/07/2014   Anticipated DC Plan:  [place]  CM consult      Choice offered to / List presented to:  C-1 Patient        [place] arranged  [place].   Status of service:  Completed, signed off Medicare Important Message given?  YES (If response is "NO", the following Medicare IM given date fields will be blank) Date Medicare IM given:  01/07/2014 Medicare IM given by:  [facility] Date Additional Medicare IM given:   Additional Medicare IM given by:    Discharge Disposition:  [place]  Per UR Regulation:  Reviewed for med. necessity/level of care/duration of stay  If discussed at Long Length of Stay Meetings, dates discussed:    Comments:  01/07/14 Mersedes Alber RN,BSN NCM [address].TC [facility] REP AWARE OF D/C & HHC ORDERS.NO FURTHER D/C NEEDS.  01/05/14 Rylen Hou RN,BSN NCM 009 2330 AWAIT PT/OT RECOMMENDATIONS.

## 2014-01-07 NOTE — Discharge Summary (Addendum)
Physician Discharge Summary  Ryan [name] YQM:[id number] DOB: 11-13-1931 DOA: 01/04/2014  PCP: Chesley Noon, MD  Admit date: 01/04/2014 Discharge date: 01/07/2014  Time spent: *50 minutes  Recommendations for Outpatient Follow-up:  1. *Follow up PCP in 2 weeks 2. Follow up neurosurgery as scheduled  Discharge Diagnoses:  Principal Problem:   Acute on chronic diastolic heart failure Active Problems:   INSOMNIA, CHRONIC   BPH (benign prostatic hyperplasia)   PEDAL EDEMA   NHL (non-Hodgkin's lymphoma)   Chronic lower back pain   RLS (restless legs syndrome)   Hypoxemia   Essential hypertension   Hypokalemia   Urinary retention   Hypoxia   Discharge Condition: Stable  Diet recommendation: Low salt diet  Filed Weights   01/05/14 0142  Weight: 86.002 kg (189 lb 9.6 oz)    History of present illness:  80 y.o. male minimal urinary output today. Feels sence of frequency, urgency and bladder pressure. Reports having laminectomuy 4 days ago and struggling with constipation since that time. Last BM before today on 11/130. Mag Citrate finally allowed the pt to have a BM. Pt was evaluated in the ED and was about to be sent home when he continued to desaturate into the mid 80s. Nadir 79. Pt put on O2 w/ correction. Denies any SOB, CP, palpitations. Endorses baseline LE edema but acutely worse over the past week. Improves w/ rest and elevation.   Hospital Course:   Hypoxia/ acute on chronic diastolic heart failure- resolved, likely due to the acute on chronic diastolic CHF. Patient's last echo from November 2014 showed EF 60-65%, with grade 2 diastolic dysfunction. Patient was started on Lasix 20 mg  IV every 12 hours. Patient at this time has improved, no longer requires oxygen. Had a very good diuretic response with Lasix. Patient will be discharged home today and will continue to use benazepril/HCTZ which he takes for hypertension. I will also advised him to cut down the salt  intake.  Constipation- resolved after patient took magnesium citrate at home. Patient was started on Senokot 1 tablet by mouth twice a day. He should continue using this medication as long as he is taking the oxycodone.  Urinary retention- resolved, likely due to constipation. Foley catheter has been discontinued. Patient able to urinate  Status post laminectomy/chronic low back pain- patient underwent laminectomy 5 days ago, continue pain control with Percocet. We started OxyContin 10 mg by mouth every 12 hours for longer acting pain management. We'll also continue with Percocet 1-2 tabs every 4 hours when necessary for breakthrough pain. Patient has responded very well to this regimen. Will discharge the patient on OxyContin 10 mg by mouth twice a day. He should follow-up with Dr. Ellene Route for further recommendations for the pain management. Patient's Valium also has been discontinued which was used for muscle spasms, we started him on Flexeril 5 mg by mouth 3 times a day which has worked very fine for him.   Hypokalemia- Replaced potassium  Chronic abnormalities seen on the CT chest- CT chest angiogram showed mediastinal lymph node 1.3 cm, also showed ill-defined opacity in the liver possible cyst. I discussed these findings with the patient and his wife, at this time we will not pursue these as inpatient,. He is to follow-up with his PCP to discuss these findings in next 2 weeks.  Visual changes in the right eye- patient while standing in the bathroom had slight visual changes in the right eye. 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 Patient is unable to describe the name of the condition but says it is similar to macular degeneration. He saw his ophthalmologist a week ago. Patient does not want CT head, denies slurred speech, no focal deficit. He will call the ophthalmologist when he goes home. Orthostatics normal.  Wife came back to tell that  the insurance company would not cover Oxycontin, will d/c oxycontin and give MS contin 15 mg po BID  Procedures:  None  Consultations:  None  Discharge Exam: Filed Vitals:   01/07/14 0459  BP: 137/77  Pulse: 84  Temp: 97.3 F (36.3 C)  Resp: 19    General: Appears in no acute distress Cardiovascular: *S1-S2 regular Respiratory: Faint crackles bilaterally at the lung bases.  Discharge Instructions You were cared for by a hospitalist during your hospital stay. If you have any questions about your discharge medications or the care you received while you were in the hospital after you are discharged, you can call the unit and asked to speak with the hospitalist on call if the hospitalist that took care of you is not available. Once you are discharged, your primary care physician will handle any further medical issues. Please note that NO REFILLS for any discharge medications will be authorized once you are discharged, as it is imperative that you return to your primary care physician (or establish a relationship with a primary care physician if you do not have one) for your aftercare needs so that they can reassess your need for medications and monitor your lab values.  Discharge Instructions    Diet - low sodium heart healthy    Complete by:  As directed      Increase activity slowly    Complete by:  As directed           Current Discharge Medication List    START taking these medications   Details  cyclobenzaprine (FLEXERIL) 5 MG tablet Take 1 tablet (5 mg total) by mouth 3 (three) times daily. Qty: 30 tablet, Refills: 0    OxyCODONE (OXYCONTIN) 10 mg T12A 12 hr tablet Take 1 tablet (10 mg total) by mouth every 12 (twelve) hours. Qty: 60 tablet, Refills: 1    senna (SENOKOT) 8.6 MG TABS tablet Take 1 tablet (8.6 mg total) by mouth 2 (two) times daily. Qty: 120 each, Refills: 0      CONTINUE these medications which have NOT CHANGED   Details  acetaminophen (TYLENOL)  500 MG tablet Take 500 mg by mouth every 4 (four) hours as needed (pain).     ALPRAZolam (XANAX) 0.25 MG tablet Take 0.125-0.25 mg by mouth 2 (two) times daily as needed for anxiety.     amLODipine (NORVASC) 10 MG tablet Take 1 tablet (10 mg total) by mouth every morning. Qty: 30 tablet, Refills: 11    b complex vitamins capsule Take 1 capsule by mouth daily.   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    Associated Diagnoses: NHL (non-Hodgkin's lymphoma)    EPINEPHrine (EPIPEN JR) 0.15 MG/0.3ML injection Inject 0.15 mg into the muscle daily as needed for anaphylaxis. For allergic reaction    ferrous sulfate 325 (65 FE) MG tablet Take 325 mg by mouth daily with breakfast.    finasteride (PROSCAR)  5 MG tablet Take 1 tablet (5 mg total) by mouth daily. Qty: 30 tablet, Refills: 5    fluticasone (FLONASE) 50 MCG/ACT nasal spray Place 1 spray into both nostrils daily as needed for allergies or rhinitis.    ibuprofen (ADVIL,MOTRIN) 200 MG tablet Take 400 mg by mouth every 4 (four) hours as needed for moderate pain.     LEVSIN/SL 0.125 MG SL tablet Take 1 tablet (0.125 mg total) by mouth every 4 (four) hours as needed for cramping. Chew up fine and swallow Qty: 30 tablet, Refills: 3    loratadine (CLARITIN) 10 MG tablet Take 10 mg by mouth daily as needed for allergies.     Lutein-Zeaxanthin 6-0.24 MG CAPS Take 1 capsule by mouth daily.    Associated Diagnoses: NHL (non-Hodgkin's lymphoma)    omeprazole (PRILOSEC) 20 MG capsule Take 20 mg by mouth daily.    oxyCODONE-acetaminophen (PERCOCET/ROXICET) 5-325 MG per tablet Take 1-2 tablets by mouth every 4 (four) hours as needed for moderate pain. Qty: 60 tablet, Refills: 0    Potassium 99 MG TABS Take 99 mg by  mouth daily. OTC potassium supplement   Associated Diagnoses: NHL (non-Hodgkin's lymphoma)    Red Yeast Rice Extract (RED YEAST RICE PO) Take 2 capsules by mouth daily.     rOPINIRole (REQUIP) 1 MG tablet Take 1 tablet (1 mg total) by mouth at bedtime. Qty: 90 tablet, Refills: 3    sodium chloride (OCEAN) 0.65 % SOLN nasal spray Place 1 spray into both nostrils daily.    traZODone (DESYREL) 50 MG tablet Take 50 mg by mouth at bedtime as needed.   Associated Diagnoses: NHL (non-Hodgkin's lymphoma)      STOP taking these medications     diazepam (VALIUM) 5 MG tablet      HYDROcodone-acetaminophen (NORCO) 7.5-325 MG per tablet      triazolam (HALCION) 0.25 MG tablet        Allergies  Allergen Reactions  . Molds & Smuts Other (See Comments)    Also dust mites causes sinus infections, h/a etc.  . Pollen Extract-Tree Extract Other (See Comments)    HEADACHES, TIRED , DRAINAGE FROM SINUSES      The results of significant diagnostics from this hospitalization (including imaging, microbiology, ancillary and laboratory) are listed below for reference.    Significant Diagnostic Studies: Dg Chest 1 View  01/05/2014   CLINICAL DATA:  Hypoxia  EXAM: CHEST - 1 VIEW  COMPARISON:  01/01/2012  FINDINGS: Chronic interstitial markings. No focal consolidation. Mild patchy left basilar opacity, likely atelectasis. No pleural effusion or pneumothorax. Mild eventration of the right hemidiaphragm.  Cardiomegaly.  IMPRESSION: No evidence of acute cardiopulmonary disease.   Electronically Signed   By: Julian Hy M.D.   On: 01/05/2014 00:06   Dg Chest 2 View  12/31/2013   CLINICAL DATA:  Preoperative evaluation. Preoperative chest radiograph.  EXAM: CHEST  2 VIEW  COMPARISON:  Chest CT 06/20/2013.  FINDINGS: Large hiatal hernia. Unchanged elevation of the RIGHT hemidiaphragm. Cardiopericardial silhouette within normal limits. Mediastinal contours are normal. Azygos fissure incidentally noted.  Resection of the distal RIGHT clavicle with rotator cuff repair.  IMPRESSION: No active cardiopulmonary disease.  Large hiatal hernia.   Electronically Signed   By: Dereck Ligas M.D.   On: 12/31/2013 12:17   Dg Lumbar Spine 2-3 Views  12/31/2013   CLINICAL DATA:  Intraoperative localization films  EXAM: LUMBAR SPINE - 2-3 VIEW  COMPARISON:  MRI of the lumbar spine of December 08, 2013  FINDINGS: The image labeled portable #1: The metallic trocar lies immediately adjacent to the posterior cortex of the L4 vertebral body. The skin spreader device overlies the inferior aspect of the spinous process of L3 and the superior aspect of the spinous process of L4.  The image labeled portable #2: The uppermost metallic trocar lies 1 cm posterior to the posterior margin of the L5 superior endplate. The lower most trocar lies 1.5 cm posterior to the posterior margin of the superior endplate of S1. The skin spreader device overlies the spinous process of L5.  IMPRESSION: Positioning of the intraoperative localization devices is as described.   Electronically Signed   By: David  Martinique   On: 12/31/2013 17:31   Ct Angio Chest Pe W/cm &/or Wo Cm  01/05/2014   CLINICAL DATA:  Acute onset of decreased O2 saturation and shortness of breath. Bilateral lower extremity edema for 1 week. Initial encounter.  EXAM: CT ANGIOGRAPHY CHEST WITH CONTRAST  TECHNIQUE: Multidetector CT imaging of the chest was performed using the standard protocol during bolus administration of intravenous contrast. Multiplanar CT image reconstructions and MIPs were obtained to evaluate the vascular anatomy.  CONTRAST:  190m OMNIPAQUE IOHEXOL 350 MG/ML SOLN  COMPARISON:  Chest radiograph performed 01/04/2014, and CT of the chest performed 06/20/2013  FINDINGS: There is no evidence of pulmonary embolus.  Bibasilar atelectasis or scarring is noted. Two lymph nodes are seen along the right minor fissure. There is unusual prominence of the soft tissues at both  hila, which may reflect mildly enlarged hilar nodes, grossly stable from the prior study, measuring up to 1.3 cm in short axis. There is no evidence of significant focal consolidation, pleural effusion or pneumothorax.  The mediastinum is unremarkable in appearance. No mediastinal lymphadenopathy is seen. The great vessels are grossly unremarkable in appearance. No pericardial effusion is identified. No axillary lymphadenopathy is seen. The visualized portions of the thyroid gland are unremarkable in appearance.  A 1.0 cm hypodensity within the right hepatic lobe is relatively stable in appearance and may reflect a cyst, though nonspecific in appearance. The spleen is unremarkable. A small amount of fluid is noted adjacent to the spleen. Note is made of a very large paraesophageal hernia, containing the entirety of the stomach.  No acute osseous abnormalities are seen. There appears to be chronic resorption or absence of the distal right clavicle, with associated degenerative change.  Review of the MIP images confirms the above findings.  IMPRESSION: 1. No evidence of pulmonary embolus. 2. Bibasilar atelectasis or scarring noted.  Lungs otherwise clear. 3. Mildly prominent bilateral hilar nodes, measuring up to 1.3 cm in short axis, relatively stable from the prior study. 4. Very large paraesophageal hernia again noted, containing the entirety of the stomach. 5. Stable 1.0 cm hypodensity within the right hepatic lobe may reflect a cyst, though nonspecific in appearance. 6. Trace free fluid noted adjacent to the spleen. 7. Chronic resorption or absence of the distal right clavicle, with associated degenerative change.   Electronically Signed   By: JGarald BaldingM.D.   On: 01/05/2014 05:01    Microbiology: Recent Results (from the past 240 hour(s))  Urine culture     Status: None   Collection Time: 01/04/14  9:56 PM  Result Value Ref Range Status   Specimen Description URINE, RANDOM  Final   Special Requests  NONE  Final   Culture  Setup Time   Final    01/05/2014 01:12 [facility]    Colony Count NO GROWTH Performed at [facility]   Final   Culture NO GROWTH Performed at [facility]   Final   Report Status 01/06/2014 FINAL  Final     Labs: Basic Metabolic Panel:  Recent Labs Lab 01/04/14 2310 01/06/14 0957  NA 138 137  K 3.1* 3.6*  CL 91* 93*  CO2 31 32  GLUCOSE 89 99  BUN 20 15  CREATININE 0.93 0.94  CALCIUM 8.9 9.0   Liver Function Tests:  Recent Labs Lab 01/04/14 2310  AST 35  ALT 24  ALKPHOS 101  BILITOT 0.9  PROT 6.7  ALBUMIN 3.7   No results for input(s): LIPASE, AMYLASE in the last 168 hours. No results for input(s): AMMONIA in the last 168 hours. CBC:  Recent Labs Lab 01/04/14 2310  WBC 10.4  NEUTROABS 8.8*  HGB 14.0  HCT 41.4  MCV 96.1  PLT 237   Cardiac Enzymes: No results for input(s): CKTOTAL, CKMB, CKMBINDEX, TROPONINI in the last 168 hours. BNP: BNP (last 3 results)  Recent Labs  01/05/14 0320  PROBNP 413.7   CBG: No results for input(s): GLUCAP in the last 168 hours.   SignedEleonore Chiquito S  [facility] 01/07/2014, 8:08 AM

## 2014-03-20 ENCOUNTER — Telehealth: Payer: Self-pay | Admitting: Cardiovascular Disease

## 2014-03-20 NOTE — Telephone Encounter (Signed)
Received records from [place] for appointment with Dr Gwenlyn Found on 04/01/14.  Records given to [facility] (medical records) for Dr Kennon Holter schedule on 04/01/14.  lp

## 2014-04-01 ENCOUNTER — Ambulatory Visit (INDEPENDENT_AMBULATORY_CARE_PROVIDER_SITE_OTHER): Payer: PPO | Admitting: Cardiovascular Disease

## 2014-04-01 ENCOUNTER — Encounter: Payer: Self-pay | Admitting: Cardiovascular Disease

## 2014-04-01 VITALS — BP 118/82 | HR 80 | Ht 66.0 in | Wt 181.2 lb

## 2014-04-01 DIAGNOSIS — Z79899 Other long term (current) drug therapy: Secondary | ICD-10-CM

## 2014-04-01 DIAGNOSIS — R600 Localized edema: Secondary | ICD-10-CM

## 2014-04-01 DIAGNOSIS — R609 Edema, unspecified: Secondary | ICD-10-CM

## 2014-04-01 DIAGNOSIS — I10 Essential (primary) hypertension: Secondary | ICD-10-CM

## 2014-04-01 DIAGNOSIS — E785 Hyperlipidemia, unspecified: Secondary | ICD-10-CM

## 2014-04-01 NOTE — Patient Instructions (Signed)
Your physician wants you to follow-up in 4 months with Dr. Gwenlyn Found. You will receive a reminder letter in the mail 2 months in advance. If you do not receive a letter, please call our office to schedule the follow-up appointment.  Echocardiogram. Echocardiography is a painless test that uses sound waves to create images of your heart. It provides your doctor with information about the size and shape of your heart and how well your heart's chambers and valves are working. This procedure takes approximately one hour. There are no restrictions for this procedure.  Dr. Gwenlyn Found has ordered for you to have lab work done in the next few days, and you MUST be FASTING (no food or drink, except sips of water with meds).

## 2014-04-01 NOTE — Assessment & Plan Note (Signed)
History of lower extremity edema or pronounced since his back surgery performed Dr. Ellene Route this past December. He has been tried on compression stockings in the past. He is currently on hydrochlorothiazide as well as furosemide and has lost quite a bit overweight. He has 1+ ankle edema currently. He had a normal 2-D echo in November 2014. I didn't repeat his 2-D echo as well as a lipid liver profile and we'll see him back in 4 months. He is aware of some restriction.

## 2014-04-01 NOTE — Assessment & Plan Note (Signed)
History of hypertension with blood pressure measurements at 118/82. He is on amlodipine and lisinopril/hydrochlorothiazide. Continue current meds at current dosing

## 2014-04-01 NOTE — Progress Notes (Signed)
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 He was referred by Dr. Melford Aase for cardiovascular evaluation because of lower extremity edema. His cardiac resector profiles are notable for treated hypertension. He smoked tobacco remotely having stopped 25-30 years ago. He's never had a heart attack or stroke and denies chest pain or shortness of breath. He did have back surgery performed by Dr. Ellene Route December of last year after which she had urinary obstruction and lower extremity edema which resolved with relief of the obstruction and additional diuretics. Dr. Linda Hedges yes has also put him on compression stockings in the past. He is aware of salt restriction. She is beginning his furosemide his edema has mildly improved to the point where he now has just trace ankle edema.   Current Outpatient Prescriptions  Medication Sig Dispense Refill  . acetaminophen (TYLENOL) 500 MG tablet Take 500 mg by mouth every 4 (four) hours as needed (pain).     Marland Kitchen ALPRAZolam (XANAX) 0.25 MG tablet Take 0.125-0.25 mg by mouth 2 (two) times daily as needed for anxiety.     Marland Kitchen amLODipine (NORVASC) 10 MG tablet Take 1 tablet (10 mg total) by mouth every morning. 30 tablet 11  . b complex vitamins capsule Take 1 capsule by mouth daily.    . benazepril-hydrochlorthiazide (LOTENSIN HCT) 20-12.5 MG per tablet take 1 tablet by mouth once daily 30 tablet 5  . CHOLECALCIFEROL PO Take 800 Units by mouth daily.     Marland Kitchen EPINEPHrine (EPIPEN JR) 0.15 MG/0.3ML injection Inject 0.15 mg into the muscle daily as needed for anaphylaxis. For allergic reaction    . ferrous sulfate 325 (65 FE) MG tablet Take 325 mg by mouth daily with breakfast.    .  finasteride (PROSCAR) 5 MG tablet Take 1 tablet (5 mg total) by mouth daily. 30 tablet 5  . fluorometholone (FML) 0.1 % ophthalmic suspension   0  . fluticasone (FLONASE) 50 MCG/ACT nasal spray Place 1 spray into both nostrils daily as needed for allergies or rhinitis.    . furosemide (LASIX) 20 MG tablet Take 40 mg by mouth daily.    Marland Kitchen HYDROcodone-acetaminophen (NORCO/VICODIN) 5-325 MG per tablet Take 1 tablet by mouth daily as needed.    Marland Kitchen ibuprofen (ADVIL,MOTRIN) 200 MG tablet Take 400 mg by mouth every 4 (four) hours as needed for moderate pain.     . Lutein-Zeaxanthin 6-0.24 MG CAPS Take 1 capsule by mouth daily.     . minocycline (MINOCIN,DYNACIN) 50 MG capsule Take 1 capsule by mouth daily.  0  . omeprazole (PRILOSEC) 20 MG capsule Take 20 mg by mouth daily.    . potassium chloride SA (K-DUR,KLOR-CON) 20 MEQ tablet Take 20 mEq by mouth 2 (two) times daily.    . prednisoLONE acetate (PRED FORTE) 1 % ophthalmic suspension Apply 1 drop to eye 4 (four) times daily.    Marland Kitchen RA COENZYME Q-10 100 MG capsule Take 100 mg by mouth daily.  1  . Red Yeast Rice Extract (RED YEAST RICE PO) Take 2 capsules by mouth daily.     Marland Kitchen rOPINIRole (REQUIP) 1 MG tablet Take 1 tablet (1 mg total) by mouth at bedtime. 90 tablet 3  . sodium chloride (OCEAN) 0.65 %  SOLN nasal spray Place 1 spray into both nostrils daily.    . traMADol (ULTRAM) 50 MG tablet Take 50 mg by mouth as needed.    . travoprost, benzalkonium, (TRAVATAN) 0.004 % ophthalmic solution Apply 1 drop to eye at bedtime.    . traZODone (DESYREL) 50 MG tablet Take 50 mg by mouth at bedtime as needed.    . triazolam (HALCION) 0.25 MG tablet Take 1 tablet by mouth at bedtime as needed.     No current facility-administered medications for this visit.    Allergies  Allergen Reactions  . Molds & Smuts Other (See Comments)    Also dust mites causes sinus infections, h/a etc.  . Pollen Extract-Tree Extract Other (See Comments)    HEADACHES, TIRED ,  DRAINAGE FROM SINUSES    History   Social History  . Marital Status: Married    Spouse Name: N/A  . Number of Children: 2  . Years of Education: N/A   Occupational History  . 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 Smoking status: Former Smoker -- 1.00 packs/day for 35 years    Types: Pipe, Cigarettes    Quit date: 02/23/1992  . Smokeless tobacco: Never Used     Comment: quit 20 years ago  . Alcohol Use: Yes     Comment: 2 drinks daily scotch  AND WINE WITH SUPPER  . Drug Use: No  . Sexual Activity: Not on file   Other Topics Concern  . Not on file   Social History Narrative   Married - second marriage   He has two children   Retired Horticulturist, commercial Professor   Currently teaches pottery making, 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[facility]   Former Smoker quit 12 years ago- smoked for 35 years   Alcohol use- yes     Review of Systems: General: negative for chills, fever, night sweats or weight changes.  Cardiovascular: negative for chest pain, dyspnea on exertion, edema, orthopnea, palpitations, paroxysmal nocturnal dyspnea or shortness of breath Dermatological: negative for rash Respiratory: negative for cough or wheezing Urologic: negative for hematuria Abdominal: negative for nausea, vomiting, diarrhea, bright red blood per rectum, melena, or hematemesis Neurologic: negative for visual changes, syncope, or dizziness All other systems reviewed and are otherwise negative except as noted above.    Blood pressure 118/82, pulse 80, height _0  (1.676 m), weight 181 lb 3.2 oz (82.192 kg).  General appearance: alert and no distress Neck: no adenopathy, no carotid bruit, no JVD, supple, symmetrical, trachea midline and thyroid not enlarged, symmetric, no tenderness/mass/nodules Lungs: clear to auscultation bilaterally Heart: regular rate and rhythm, S1, S2 normal, no murmur, click, rub or gallop Extremities: trace ankle edema  EKG normal sinus  rhythm at 80 without ST or T-wave changes. I personally reviewed this EKG  ASSESSMENT AND PLAN:   HYPERTENSION History of hypertension with blood pressure measurements at 118/82. He is on amlodipine and lisinopril/hydrochlorothiazide. Continue current meds at current dosing   PEDAL EDEMA History of lower extremity edema or pronounced since his back surgery performed Dr. Ellene Route this past December. He has been tried on compression stockings in the past. He is currently on hydrochlorothiazide as well as furosemide and has lost quite a bit overweight. He has 1+ ankle edema currently. He had a normal 2-D echo in November 2014. I didn't repeat his 2-D echo as well as a lipid liver profile and we'll see him back in 4  months. He is aware of some restriction.       Lorretta Harp MD FACP,FACC,FAHA, [facility] 04/01/2014 2:36 PM

## 2014-04-02 LAB — LIPID PANEL
CHOLESTEROL: 188 mg/dL (ref 0–200)
HDL: 52 mg/dL (ref >=40)
LDL CALC: 103 mg/dL — AB (ref 0–99)
TRIGLYCERIDES: 166 mg/dL — AB (ref <150)
Total CHOL/HDL Ratio: 3.6 Ratio
VLDL: 33 mg/dL (ref 0–40)

## 2014-04-02 LAB — HEPATIC FUNCTION PANEL
ALK PHOS: 71 U/L (ref 39–117)
ALT: 11 U/L (ref 0–53)
AST: 22 U/L (ref 0–37)
Albumin: 4.1 g/dL (ref 3.5–5.2)
BILIRUBIN INDIRECT: 0.7 mg/dL (ref 0.2–1.2)
Bilirubin, Direct: 0.1 mg/dL (ref 0.0–0.3)
Total Bilirubin: 0.8 mg/dL (ref 0.2–1.2)
Total Protein: 6 g/dL (ref 6.0–8.3)

## 2014-04-08 ENCOUNTER — Ambulatory Visit (HOSPITAL_COMMUNITY)
Admission: RE | Admit: 2014-04-08 | Discharge: 2014-04-08 | Disposition: A | Discharge status: Home / Self Care | Payer: PPO | Source: Ambulatory Visit | Attending: Cardiovascular Disease | Admitting: Cardiovascular Disease

## 2014-04-08 DIAGNOSIS — R600 Localized edema: Secondary | ICD-10-CM | POA: Insufficient documentation

## 2014-04-08 DIAGNOSIS — I10 Essential (primary) hypertension: Secondary | ICD-10-CM

## 2014-04-08 NOTE — Progress Notes (Signed)
2D Echo Performed 04/08/2014    Ryan [name], RCS

## 2014-05-14 ENCOUNTER — Ambulatory Visit: Payer: Managed Care, Other (non HMO) | Admitting: Cardiovascular Disease

## 2014-05-29 ENCOUNTER — Ambulatory Visit
Admission: RE | Admit: 2014-05-29 | Discharge: 2014-05-29 | Disposition: A | Discharge status: Home / Self Care | Payer: PPO | Source: Ambulatory Visit | Attending: Allergy | Admitting: Allergy

## 2014-05-29 ENCOUNTER — Other Ambulatory Visit: Payer: Self-pay | Admitting: Allergy

## 2014-05-29 DIAGNOSIS — J40 Bronchitis, not specified as acute or chronic: Secondary | ICD-10-CM

## 2014-06-12 ENCOUNTER — Ambulatory Visit
Admission: RE | Admit: 2014-06-12 | Discharge: 2014-06-12 | Disposition: A | Discharge status: Home / Self Care | Payer: PPO | Source: Ambulatory Visit | Attending: Family Medicine | Admitting: Family Medicine

## 2014-06-12 ENCOUNTER — Other Ambulatory Visit: Payer: Self-pay | Admitting: Family Medicine

## 2014-06-12 DIAGNOSIS — J189 Pneumonia, unspecified organism: Secondary | ICD-10-CM

## 2014-06-25 ENCOUNTER — Ambulatory Visit (HOSPITAL_BASED_OUTPATIENT_CLINIC_OR_DEPARTMENT_OTHER): Payer: PPO | Admitting: Oncology

## 2014-06-25 ENCOUNTER — Other Ambulatory Visit (HOSPITAL_BASED_OUTPATIENT_CLINIC_OR_DEPARTMENT_OTHER): Payer: PPO

## 2014-06-25 ENCOUNTER — Telehealth: Payer: Self-pay | Admitting: Oncology

## 2014-06-25 VITALS — BP 147/93 | HR 84 | Temp 98.6°F | Ht 66.0 in | Wt 180.6 lb

## 2014-06-25 DIAGNOSIS — C8590 Non-Hodgkin lymphoma, unspecified, unspecified site: Secondary | ICD-10-CM

## 2014-06-25 LAB — CBC WITH DIFFERENTIAL/PLATELET
BASO%: 0.2 % (ref 0.0–2.0)
BASOS ABS: 0 10*3/uL (ref 0.0–0.1)
EOS ABS: 0.1 10*3/uL (ref 0.0–0.5)
EOS%: 2.1 % (ref 0.0–7.0)
HEMATOCRIT: 43.5 % (ref 38.4–49.9)
HGB: 14.6 g/dL (ref 13.0–17.1)
LYMPH#: 1.1 10*3/uL (ref 0.9–3.3)
LYMPH%: 21.3 % (ref 14.0–49.0)
MCH: 31.5 pg (ref 27.2–33.4)
MCHC: 33.6 g/dL (ref 32.0–36.0)
MCV: 94 fL (ref 79.3–98.0)
MONO#: 0.4 10*3/uL (ref 0.1–0.9)
MONO%: 7.2 % (ref 0.0–14.0)
NEUT#: 3.6 10*3/uL (ref 1.5–6.5)
NEUT%: 69.2 % (ref 39.0–75.0)
PLATELETS: 188 10*3/uL (ref 140–400)
RBC: 4.63 10*6/uL (ref 4.20–5.82)
RDW: 15.1 % — AB (ref 11.0–14.6)
WBC: 5.3 10*3/uL (ref 4.0–10.3)

## 2014-06-25 NOTE — Progress Notes (Signed)
  [facility] OFFICE PROGRESS NOTE   Diagnosis: Non-Hodgkin's lymphoma  INTERVAL HISTORY:   [name] returns as scheduled. He underwent a right laminotomy foraminotomy at L4-5 and L5-S1 on 12/31/2013. He reports right leg pain has improved. He continues to low back pain, relieved with recent steroid-induced injections. He does not have significant abdominal pain. He was diagnosed with "pneumonia "last month. No fever, night sweats, or palpable lymph nodes.  Objective:  Vital signs in last 24 hours:  Blood pressure 147/93, pulse 84, temperature 98.6 F (37 C), temperature source Oral, height _0  (1.676 m), weight 180 lb 9.6 oz (81.92 kg), SpO2 98 %.    HEENT: Neck without mass Lymphatics: No cervical, supra-clavicular, axillary, or inguinal nodes Resp: Inspiratory rhonchi at the left posterior base, no respiratory distress Cardio: Regular rate and rhythm GI: No hepatomegaly, no mass, nontender Vascular: Trace pitting edema at the low leg bilaterally   Lab Results:  Lab Results  Component Value Date   WBC 5.3 06/25/2014   HGB 14.6 06/25/2014   HCT 43.5 06/25/2014   MCV 94.0 06/25/2014   PLT 188 06/25/2014   NEUTROABS 3.6 06/25/2014     Medications: I have reviewed the patient's current medications.  Assessment/Plan: 1. Non-Hodgkin's,, low-grade B cell diagnosed in January 2013, disease localized to the pelvis, status post involved field radiation to 3600cGy completed in March of 2013, no evidence of recurrent lymphoma on a CT 04/17/2013  2. Intermittent abdominal pain-etiology unclear  3. Chronic back pain-followed by Dr.Elsner status post epidural steroid injections and decompression surgery 12/31/2013  4. Anemia-history of iron deficiency, normal ferritin in August 2015 and November 2015  Disposition:  He remains in clinical remission from non-Hodgkin's lymphoma. He will return for an office visit in 6 months.  Betsy Coder,  MD  06/25/2014  10:17 AM

## 2014-06-25 NOTE — Telephone Encounter (Signed)
Pt confirmed labs/ov per 05/24 POF, gave pt AVS and Calendar..... KJ

## 2014-07-29 ENCOUNTER — Other Ambulatory Visit: Payer: Self-pay

## 2014-08-16 ENCOUNTER — Encounter: Payer: Self-pay | Admitting: Radiation Oncology

## 2014-08-16 ENCOUNTER — Ambulatory Visit
Admission: RE | Admit: 2014-08-16 | Discharge: 2014-08-16 | Disposition: A | Discharge status: Home / Self Care | Payer: PPO | Source: Ambulatory Visit | Attending: Radiation Oncology | Admitting: Radiation Oncology

## 2014-08-16 VITALS — BP 102/62 | Temp 98.0°F | Ht 66.0 in | Wt 181.1 lb

## 2014-08-16 DIAGNOSIS — C8599 Non-Hodgkin lymphoma, unspecified, extranodal and solid organ sites: Secondary | ICD-10-CM | POA: Insufficient documentation

## 2014-08-16 NOTE — Progress Notes (Signed)
Ryan [name] reports that he no longer has no voiced concerns today. Decreased fatigue.  VSS. Maintaing weight  BP 102/62 mmHg  Temp(Src) 98 F (36.7 C)  Ht _0  (1.676 m)  Wt 181 lb 1.6 oz (82.146 kg)  BMI 29.24 kg/m2   Wt Readings from Last 3 Encounters:  08/16/14 181 lb 1.6 oz (82.146 kg)  06/25/14 180 lb 9.6 oz (81.92 kg)  04/01/14 181 lb 3.2 oz (82.192 kg)

## 2014-08-16 NOTE — Progress Notes (Signed)
Radiation Oncology         [phone] ________________________________  Name: Ryan [name] MRN: [id number]  Date: 08/16/2014  DOB: 06/28/1931  Follow-Up Visit Note  outpatient  CC: Chesley Noon, MD  Annia Belt, MD Clarene [facility]   ICD-9-CM ICD-10-CM   1. 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 He reports that he no longer has any voiced concerns today. He reports decreased fatigue. Denies nausea and is able to move well. No vomiting. No palpable lumps or bumps. The pt did have his ventral hernia repaired.  ALLERGIES:  is allergic to molds & smuts and pollen extract-tree extract.  Meds: Current Outpatient Prescriptions  Medication Sig Dispense Refill  . acetaminophen (TYLENOL) 500 MG tablet Take 500 mg by mouth every 4 (four) hours as needed (pain).     Marland Kitchen ALPRAZolam (XANAX) 0.25 MG tablet Take 0.125-0.25 mg by mouth 2 (two) times daily as needed for anxiety.     Marland Kitchen amLODipine (NORVASC) 10 MG tablet Take 1 tablet (10 mg total) by mouth every morning. 30 tablet 11  . beclomethasone (QVAR) 80 MCG/ACT inhaler inhale 2 puffs by mouth every 12 hours    . benazepril-hydrochlorthiazide (LOTENSIN HCT) 20-12.5 MG per tablet take 1 tablet by mouth once daily 30 tablet 5  . CHOLECALCIFEROL PO Take 800 Units by mouth daily.     Marland Kitchen EPINEPHrine (EPIPEN JR) 0.15 MG/0.3ML injection Inject 0.15 mg into the muscle daily as needed for anaphylaxis. For allergic reaction    . ferrous sulfate 325 (65 FE) MG tablet Take 325 mg by mouth daily with breakfast.    . finasteride (PROSCAR) 5 MG tablet Take 1 tablet (5 mg total) by mouth daily. 30 tablet 5  . fluorometholone (FML) 0.1 % ophthalmic suspension   0  .  fluticasone (FLONASE) 50 MCG/ACT nasal spray Place 1 spray into both nostrils daily as needed for allergies or rhinitis.    . Lutein-Zeaxanthin 6-0.24 MG CAPS Take 1 capsule by mouth daily.     . magnesium 30 MG tablet Take 30 mg by mouth.    . montelukast (SINGULAIR) 10 MG tablet take 1 tablet by mouth at bedtime    . omeprazole (PRILOSEC) 20 MG capsule Take 20 mg by mouth daily.    . Red Yeast Rice Extract (RED YEAST RICE PO) Take 2 capsules by mouth daily.     Marland Kitchen rOPINIRole (REQUIP) 1 MG tablet Take 1 tablet (1 mg total) by mouth at bedtime. 90 tablet 3  . sodium chloride (OCEAN) 0.65 % SOLN nasal spray Place 1 spray into both nostrils daily.    . traMADol (ULTRAM) 50 MG tablet Take 50 mg by mouth as needed.    . traZODone (DESYREL) 50 MG tablet Take 50 mg by mouth at bedtime as needed.    . triazolam (HALCION) 0.25 MG tablet Take 1 tablet by mouth at bedtime as needed.    Marland Kitchen albuterol (PROVENTIL HFA;VENTOLIN HFA) 108 (90 BASE) MCG/ACT inhaler inhale 1 to 2 puffs every 4 to 6 hours if needed for cough or wheezing    . prednisoLONE acetate (PRED FORTE) 1 % ophthalmic suspension Apply 1 drop to eye 4 (four) times daily.     No current facility-administered medications  for this encounter.   Physical Findings: The patient is in no acute distress. Patient is alert and oriented.  height is _0  (1.676 m) and weight is 181 lb 1.6 oz (82.146 kg). His temperature is 98 F (36.7 C). His blood pressure is 102/62. Marland Kitchen General: Alert and oriented, in no acute distress HEENT: Head is normocephalic. Extraocular movements are intact. Oropharynx is clear. Neck: Neck is supple, no palpable cervical or supraclavicular lymphadenopathy. Heart: Slightly bradycardic. Irregular rhythm (pt aware - this is chronic, he reports). Chest: Clear to auscultation bilaterally, with no rhonchi, wheezes, or rales. Abdomen: Soft, nontender, nondistended, with no rigidity or guarding. No masses palpated. Extremities: Bilateral  ankle edema. Lymphatics: see Neck Exam Skin: No concerning lesions. Psychiatric: Judgment and insight are intact. Affect is appropriate.  Lab Findings: Lab Results  Component Value Date   WBC 5.3 06/25/2014   HGB 14.6 06/25/2014   HCT 43.5 06/25/2014   MCV 94.0 06/25/2014   PLT 188 06/25/2014    Radiographic Findings: No results found.  Impression/Plan: Low Grade Lymphoma.  Doing well with NED.  I gave the pt a card and he will f/u with me in 1 year.  I have encouraged him to call if he has any issues or concerns in the future.     This document serves as a record of services personally performed by Eppie Gibson, MD. It was created on her behalf by Darcus Austin, a trained medical scribe. The creation of this record is based on the scribe's personal observations and the provider's statements to them. This document has been checked and approved by the attending provider.     _____________________________________   Eppie Gibson, MD

## 2014-11-22 ENCOUNTER — Telehealth: Payer: Self-pay | Admitting: Cardiovascular Disease

## 2014-11-22 NOTE — Telephone Encounter (Signed)
Informed  Ryan [name], CMA Dr Debara Pickett reviewed ekg- SINUS PAC'S , reviewed last EKG.

## 2014-11-22 NOTE — Telephone Encounter (Signed)
Eboni will be sending over an EKG that he wants a nurse to look at . Please call   Thanks

## 2014-12-03 ENCOUNTER — Encounter: Payer: Self-pay | Admitting: Cardiovascular Disease

## 2014-12-13 ENCOUNTER — Other Ambulatory Visit: Payer: Self-pay | Admitting: *Deleted

## 2014-12-13 ENCOUNTER — Telehealth: Payer: Self-pay | Admitting: Oncology

## 2014-12-13 ENCOUNTER — Telehealth: Payer: Self-pay | Admitting: *Deleted

## 2014-12-13 NOTE — Telephone Encounter (Signed)
Per Dr Benay Spice; regarding pt request to move up appt; notified pt that Ned Card, NP can see pt Tues 11/15 @ 2:45 and schedulers may be calling him as well.  Pt verbalized understanding and expressed appreciation.

## 2014-12-13 NOTE — Telephone Encounter (Signed)
Spoke with patient re appointment for 11/15.

## 2014-12-17 ENCOUNTER — Ambulatory Visit (HOSPITAL_BASED_OUTPATIENT_CLINIC_OR_DEPARTMENT_OTHER): Payer: PPO | Admitting: Nurse Practitioner

## 2014-12-17 ENCOUNTER — Telehealth: Payer: Self-pay | Admitting: Nurse Practitioner

## 2014-12-17 VITALS — BP 149/88 | HR 74 | Temp 98.1°F | Resp 18 | Ht 66.0 in | Wt 198.2 lb

## 2014-12-17 DIAGNOSIS — Z862 Personal history of diseases of the blood and blood-forming organs and certain disorders involving the immune mechanism: Secondary | ICD-10-CM

## 2014-12-17 DIAGNOSIS — C8599 Non-Hodgkin lymphoma, unspecified, extranodal and solid organ sites: Secondary | ICD-10-CM

## 2014-12-17 DIAGNOSIS — Z8572 Personal history of non-Hodgkin lymphomas: Secondary | ICD-10-CM | POA: Diagnosis not present

## 2014-12-17 DIAGNOSIS — R140 Abdominal distension (gaseous): Secondary | ICD-10-CM

## 2014-12-17 DIAGNOSIS — R109 Unspecified abdominal pain: Secondary | ICD-10-CM

## 2014-12-17 DIAGNOSIS — M549 Dorsalgia, unspecified: Secondary | ICD-10-CM

## 2014-12-17 NOTE — Progress Notes (Addendum)
  [facility] OFFICE PROGRESS NOTE   Diagnosis:  Non-Hodgkin's lymphoma  INTERVAL HISTORY:   Ryan [name] returns prior to scheduled follow-up. His abdomen feels "swollen and hard". His pants no longer fit. He is having mild nausea. No constipation. No significant abdominal pain. He reports a good appetite and weight gain. He continues to have low back pain. No fevers or sweats. He notes recent increased leg edema.  Objective:  Vital signs in last 24 hours:  Blood pressure 149/88, pulse 74, temperature 98.1 F (36.7 C), temperature source Oral, resp. rate 18, height _0  (1.676 m), weight 198 lb 3.2 oz (89.903 kg), SpO2 94 %.    HEENT: No thrush or ulcers. Lymphatics: No palpable cervical, supraclavicular, axillary or inguinal lymph nodes. Resp: Rales left lung base. No respiratory distress. Cardio: Regular rate and rhythm with premature beats. GI: Soft, nontender. No organomegaly. No mass. Vascular: Pitting edema at the lower legs bilaterally.    Lab Results:  Lab Results  Component Value Date   WBC 5.3 06/25/2014   HGB 14.6 06/25/2014   HCT 43.5 06/25/2014   MCV 94.0 06/25/2014   PLT 188 06/25/2014   NEUTROABS 3.6 06/25/2014    Imaging:  No results found.  Medications: I have reviewed the patient's current medications.  Assessment/Plan: 1. Non-Hodgkin's,, low-grade B cell diagnosed in January 2013, disease localized to the pelvis, status post involved field radiation to 3600cGy completed in March of 2013, no evidence of recurrent lymphoma on a CT 04/17/2013  2. Intermittent abdominal pain-etiology unclear  3. Chronic back pain-followed by Dr.Elsner status post epidural steroid injections and decompression surgery 12/31/2013  4. Anemia-history of iron deficiency, normal ferritin in August 2015 and November 2015    Disposition: Ryan [name] has a history of non-Hodgkin's lymphoma dating to 2013. He completed involved field radiation. He presents  prior to scheduled follow-up for evaluation of abdominal distention. We are referring him for restaging CT scans in the next one to 2 weeks. Follow-up visit will be scheduled pending CT results.  Patient seen with Dr. Benay Spice.  Ned Card ANP/GNP-BC   12/17/2014  3:20 PM  This was a shared visit with Ned Card. Ryan [name] was interviewed and examined. He will undergo restaging CT scans to rule out progression of lymphoma.  Julieanne Manson, M.D.

## 2014-12-17 NOTE — Telephone Encounter (Signed)
per pof to sch pt trmt-gave pt copy of avs

## 2014-12-23 ENCOUNTER — Telehealth: Payer: Self-pay | Admitting: Oncology

## 2014-12-23 ENCOUNTER — Ambulatory Visit: Payer: PPO | Admitting: Oncology

## 2014-12-23 ENCOUNTER — Ambulatory Visit (HOSPITAL_BASED_OUTPATIENT_CLINIC_OR_DEPARTMENT_OTHER): Payer: PPO

## 2014-12-23 ENCOUNTER — Other Ambulatory Visit: Payer: Self-pay | Admitting: *Deleted

## 2014-12-23 DIAGNOSIS — C8593 Non-Hodgkin lymphoma, unspecified, intra-abdominal lymph nodes: Secondary | ICD-10-CM | POA: Diagnosis not present

## 2014-12-23 DIAGNOSIS — C8599 Non-Hodgkin lymphoma, unspecified, extranodal and solid organ sites: Secondary | ICD-10-CM

## 2014-12-23 LAB — CBC WITH DIFFERENTIAL/PLATELET
BASO%: 0.5 % (ref 0.0–2.0)
Basophils Absolute: 0 10*3/uL (ref 0.0–0.1)
EOS%: 0.8 % (ref 0.0–7.0)
Eosinophils Absolute: 0.1 10*3/uL (ref 0.0–0.5)
HCT: 43.7 % (ref 38.4–49.9)
HEMOGLOBIN: 14.4 g/dL (ref 13.0–17.1)
LYMPH%: 15.6 % (ref 14.0–49.0)
MCH: 31.8 pg (ref 27.2–33.4)
MCHC: 32.8 g/dL (ref 32.0–36.0)
MCV: 97 fL (ref 79.3–98.0)
MONO#: 0.5 10*3/uL (ref 0.1–0.9)
MONO%: 7.6 % (ref 0.0–14.0)
NEUT%: 75.5 % — ABNORMAL HIGH (ref 39.0–75.0)
NEUTROS ABS: 5.2 10*3/uL (ref 1.5–6.5)
PLATELETS: 271 10*3/uL (ref 140–400)
RBC: 4.51 10*6/uL (ref 4.20–5.82)
RDW: 14.3 % (ref 11.0–14.6)
WBC: 6.9 10*3/uL (ref 4.0–10.3)
lymph#: 1.1 10*3/uL (ref 0.9–3.3)

## 2014-12-23 LAB — COMPREHENSIVE METABOLIC PANEL (CC13)
ALBUMIN: 3.9 g/dL (ref 3.5–5.0)
ALK PHOS: 80 U/L (ref 40–150)
ALT: 20 U/L (ref 0–55)
ANION GAP: 11 meq/L (ref 3–11)
AST: 27 U/L (ref 5–34)
BILIRUBIN TOTAL: 0.37 mg/dL (ref 0.20–1.20)
BUN: 22.7 mg/dL (ref 7.0–26.0)
CALCIUM: 9.2 mg/dL (ref 8.4–10.4)
CO2: 29 mEq/L (ref 22–29)
Chloride: 100 mEq/L (ref 98–109)
Creatinine: 1.3 mg/dL (ref 0.7–1.3)
EGFR: 51 mL/min/{1.73_m2} — AB (ref >90)
Glucose: 103 mg/dl (ref 70–140)
POTASSIUM: 3.9 meq/L (ref 3.5–5.1)
Sodium: 140 mEq/L (ref 136–145)
TOTAL PROTEIN: 6.6 g/dL (ref 6.4–8.3)

## 2014-12-23 LAB — LACTATE DEHYDROGENASE (CC13): LDH: 291 U/L — AB (ref 125–245)

## 2014-12-23 NOTE — Telephone Encounter (Signed)
Called patient per pof and he has been added to get a lab drawn today    Ryan [name]

## 2014-12-24 ENCOUNTER — Encounter (HOSPITAL_COMMUNITY): Payer: Self-pay

## 2014-12-24 ENCOUNTER — Ambulatory Visit (HOSPITAL_COMMUNITY)
Admission: RE | Admit: 2014-12-24 | Discharge: 2014-12-24 | Disposition: A | Discharge status: Home / Self Care | Payer: PPO | Source: Ambulatory Visit | Attending: Nurse Practitioner | Admitting: Nurse Practitioner

## 2014-12-24 DIAGNOSIS — C8593 Non-Hodgkin lymphoma, unspecified, intra-abdominal lymph nodes: Secondary | ICD-10-CM | POA: Diagnosis present

## 2014-12-24 DIAGNOSIS — K449 Diaphragmatic hernia without obstruction or gangrene: Secondary | ICD-10-CM | POA: Insufficient documentation

## 2014-12-24 DIAGNOSIS — R918 Other nonspecific abnormal finding of lung field: Secondary | ICD-10-CM | POA: Diagnosis not present

## 2014-12-24 DIAGNOSIS — K5730 Diverticulosis of large intestine without perforation or abscess without bleeding: Secondary | ICD-10-CM | POA: Insufficient documentation

## 2014-12-24 DIAGNOSIS — C8599 Non-Hodgkin lymphoma, unspecified, extranodal and solid organ sites: Secondary | ICD-10-CM

## 2014-12-24 DIAGNOSIS — K4090 Unilateral inguinal hernia, without obstruction or gangrene, not specified as recurrent: Secondary | ICD-10-CM | POA: Insufficient documentation

## 2014-12-24 MED ORDER — IOHEXOL 300 MG/ML  SOLN
100.0000 mL | Freq: Once | INTRAMUSCULAR | Status: AC | PRN
  Administered 2014-12-24: 100 mL via INTRAVENOUS

## 2014-12-31 ENCOUNTER — Telehealth: Payer: Self-pay | Admitting: *Deleted

## 2014-12-31 NOTE — Telephone Encounter (Signed)
-----  Message from Ladell Pier, MD sent at 12/30/2014  8:36 PM EST ----- Please call patient, cts show no evidence of recurrent lymphoma

## 2014-12-31 NOTE — Telephone Encounter (Signed)
Per Dr. Benay Spice; notified pt that ct show no evidence of recurrent lymphoma.  Pt verbalized understanding and expressed appreciation for call.

## 2015-01-13 ENCOUNTER — Ambulatory Visit
Admission: RE | Admit: 2015-01-13 | Discharge: 2015-01-13 | Disposition: A | Discharge status: Home / Self Care | Payer: PPO | Source: Ambulatory Visit | Attending: Physician Assistant | Admitting: Physician Assistant

## 2015-01-13 ENCOUNTER — Other Ambulatory Visit: Payer: Self-pay | Admitting: Physician Assistant

## 2015-01-13 DIAGNOSIS — R609 Edema, unspecified: Secondary | ICD-10-CM

## 2015-01-13 DIAGNOSIS — R0602 Shortness of breath: Secondary | ICD-10-CM

## 2015-02-05 DIAGNOSIS — J4532 Mild persistent asthma with status asthmaticus: Secondary | ICD-10-CM | POA: Diagnosis not present

## 2015-02-05 DIAGNOSIS — R609 Edema, unspecified: Secondary | ICD-10-CM | POA: Diagnosis not present

## 2015-02-05 DIAGNOSIS — R002 Palpitations: Secondary | ICD-10-CM | POA: Diagnosis not present

## 2015-02-12 ENCOUNTER — Ambulatory Visit (INDEPENDENT_AMBULATORY_CARE_PROVIDER_SITE_OTHER): Payer: PPO | Admitting: Allergy and Immunology

## 2015-02-12 ENCOUNTER — Encounter: Payer: Self-pay | Admitting: Allergy and Immunology

## 2015-02-12 VITALS — BP 120/68 | HR 74 | Temp 97.7°F | Resp 18 | Ht 66.14 in | Wt 180.8 lb

## 2015-02-12 DIAGNOSIS — J449 Chronic obstructive pulmonary disease, unspecified: Secondary | ICD-10-CM

## 2015-02-12 DIAGNOSIS — J4540 Moderate persistent asthma, uncomplicated: Secondary | ICD-10-CM | POA: Insufficient documentation

## 2015-02-12 DIAGNOSIS — J3089 Other allergic rhinitis: Secondary | ICD-10-CM

## 2015-02-12 DIAGNOSIS — J45909 Unspecified asthma, uncomplicated: Secondary | ICD-10-CM

## 2015-02-12 MED ORDER — AZELASTINE HCL 0.1 % NA SOLN: NASAL | Status: DC

## 2015-02-12 MED ORDER — FLUTICASONE PROPIONATE 50 MCG/ACT NA SUSP: NASAL | Status: DC

## 2015-02-12 MED ORDER — BUDESONIDE-FORMOTEROL FUMARATE 160-4.5 MCG/ACT IN AERO: 2.0000 | INHALATION_SPRAY | Freq: Two times a day (BID) | RESPIRATORY_TRACT | Status: DC

## 2015-02-12 MED ORDER — LEVALBUTEROL HCL 1.25 MG/3ML IN NEBU: 1.2500 mg | INHALATION_SOLUTION | Freq: Once | RESPIRATORY_TRACT | Status: DC

## 2015-02-12 MED ORDER — IPRATROPIUM BROMIDE 0.02 % IN SOLN: 0.5000 mg | Freq: Once | RESPIRATORY_TRACT | Status: DC

## 2015-02-12 NOTE — Progress Notes (Signed)
New Patient Note  RE: Ryan [name] MRN: [id number] DOB: 08/24/1931 Date of Office Visit: 02/12/2015  Referring provider: Chesley Noon, MD Primary care provider: Chesley Noon, MD  Chief Complaint: Wheezing   History of present illness: HPI Comments: Ryan "Eythan Jayne is a 82 y.o. male with a complex medical history who presents today for his initial consultation of asthma and rhinitis.  He reports that he was diagnosed with asthma by Dr. Donneta Romberg one year ago and was started on a maintenance inhaler (unspecified) but discontinued for unclear reasons. He had an asthma exacerbation sometime over this past year so was started on Qvar. The patient does not use a spacer device with HFA inhalers. More recently he we was given a sample of Breo Ellipta but has been unable to perceive symptom reduction.  He experiences chest tightness and dyspnea almost daily, particularly at nighttime, as well as occasional wheezing.  His symptoms are relieved with albuterol.  He has a history of allergic rhinitis which had been treated for 15 years with aeroallergen immunotherapy.  He discontinued immunotherapy last year because he did not perceive benefit.  His nasal symptoms consist of nasal congestion, thick postnasal drainage, throat irritation, hoarseness, and occasional sinus pressure.  These symptoms occur year around but tend to be more frequent with rapid weather changes.   Assessment and plan: Asthma with COPD ([place])  A prescription has been provided for Symbicort (budesonide/formoterol) 160/4.5 g, 2 inhalations twice a day.  To maximize pulmonary deposition, a spacer has been provided along with instructions for its proper administration with an HFA inhaler.  Continue albuterol HFA every 4-6 hours as needed.  Subjective and objective measures of pulmonary function will be followed and the treatment plan will be adjusted accordingly.  Allergic rhinitis with a nonallergic  component  Aeroallergen avoidance measures have been discussed and provided in written form.  A prescription has been provided for azelastine nasal spray, 1-2 sprays per nostril 2 times daily as needed. Proper nasal spray technique has been discussed and demonstrated.   If needed, he may add fluticasone nasal spray.  Continue nasal saline lavage as needed.    Meds ordered this encounter  Medications  . ipratropium (ATROVENT) nebulizer solution 0.5 mg    Sig:   . levalbuterol (XOPENEX) nebulizer solution 1.25 mg    Sig:   . budesonide-formoterol (SYMBICORT) 160-4.5 MCG/ACT inhaler    Sig: Inhale 2 puffs into the lungs 2 (two) times daily.    Dispense:  1 Inhaler    Refill:  3  . azelastine (ASTELIN) 0.1 % nasal spray    Sig: Use 1-2 sprays in each nostril twice daily as needed for stuffy nose or drainage.    Dispense:  30 mL    Refill:  5  . fluticasone (FLONASE) 50 MCG/ACT nasal spray    Sig: Use 1-2 sprays twice daily as needed for stuffy nose or drainage.    Dispense:  16 g    Refill:  5    Diagnositics: Spirometry: Spirometry reveals FVC of 1.67 L and FEV1 of 1.41 L (61% predicted) without significant post bronchodilator improvement. Allergy skin testing: Positive to pine tree pollen, Aspergillus mold, and borderline positive to dog epithelia.    Physical examination: Blood pressure 120/68, pulse 74, temperature 97.7 F (36.5 C), resp. rate 18, height 5' 6.14" (1.68 m), weight 180 lb 12.4 oz (82 kg), SpO2 94 %.  General: Alert, interactive, in no acute distress. HEENT: TMs pearly gray, turbinates moderately  edematous without discharge, post-pharynx moderately erythematous. Neck: Supple without lymphadenopathy. Lungs: Mildly decreased breath sounds bilaterally without wheezing, rhonchi or rales. CV: Normal S1, S2 without murmurs. Abdomen: Nondistended, nontender. Skin: Warm and dry, without lesions or rashes. Extremities:  No clubbing, cyanosis or edema. Neuro:    Grossly intact.  Review of systems: Review of Systems  Constitutional: Negative for fever, chills and weight loss.  HENT: Positive for congestion. Negative for nosebleeds.   Eyes: Negative for blurred vision.  Respiratory: Positive for shortness of breath and wheezing. Negative for hemoptysis.   Cardiovascular: Negative for chest pain.  Gastrointestinal: Negative for diarrhea and constipation.  Genitourinary: Negative for dysuria.  Musculoskeletal: Negative for myalgias and joint pain.  Skin: Negative for itching and rash.  Neurological: Negative for dizziness.  Endo/Heme/Allergies: Does not bruise/bleed easily.    Past medical history: Past Medical History  Diagnosis Date  . Hyperlipemia   . Hypertension   . Benign prostatic hypertrophy   . Chronic fatigue   . Lactose intolerance   . Sleep disorder     DOES NOT SLEEP WELL  . Personal history of colonic polyps 2004    hyperplastic Dr. Collene Mares  . Gluten intolerance   . Barrett's esophagus   . Esophageal reflux   . Diverticulosis of colon (without mention of hemorrhage)   . Periaortic lymphadenopathy 02/16/2011  . Atelectasis, bilateral 02/10/11    Noted On CT Scan - Mild Dependent Atelectasis at the Lung Bases  . Hiatal hernia 02/10/11    Noted on CT Scan - Moderate Hiatal Hernia  . Renal cyst 02/10/11     Noted on CT Scan - Bilateral Renal Cysts  . S/P radiation therapy 03/15/11 - 04/09/11    Abdominal/ Pelvic Tumor, 3600 cGy/20 Fractions  . Microcytic anemia 04/20/2011   . Abdominal pain     Bloating and gas  . Gastritis   . Dysrhythmia     SKIPPED HEART BEAT SOMETIMES  . Environmental allergies   . Pain     LOWER BACK PAIN - DDD, SCOLIOSIS, BONE SPUR.  FINISHED THE 3RD EPIDURAL STEROID INJECTION 7/15 - DONE BY DR. Nelva Bush. - STILL HAVING BACK PAIN  . Osteoarthritis     hands/feet,knees, NECK, BACK  . Congestion of throat     mouth breath at night, dry mouth  . Bilateral lower extremity edema   . Hypertension   . Melanoma  ([place])     lymphoma  . Lymphoma of lymph nodes in pelvis ([place]) 03/03/2011     Large Right Retroperitoneal Mass  . 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 Rotator cuff repair      right  . Cataract extraction, bilateral    . Tonsillectomy    . Arthroscopic repair acl      right  . Bone marrow aspiration  02/25/11    Bone Marrow, Aspirate, Clot, and Bilateral Bx, Right PIC  . Hernia repair      LEFT INGUINAL   . Ventral hernia repair N/A 08/23/2012    Procedure: HERNIA REPAIR VENTRAL ADULT;  Surgeon: Madilyn Hook, DO;  Location: [facility];  Service: General;  Laterality: N/A;  . Insertion of mesh N/A 08/23/2012    Procedure: INSERTION OF MESH;  Surgeon: Madilyn Hook, DO;  Location: [facility];  Service: General;  Laterality: N/A;  . Tonsillectomy    . Eye surgery    .  Lumbar laminectomy/decompression microdiscectomy Right 12/31/2013    Procedure: RIGHT L4-5 L5-S1 LAMINECTOMY;  Surgeon: Kristeen Miss, MD;  Location: [facility];  Service: Neurosurgery;  Laterality: Right;  RIGHT L4-5 L5-S1 LAMINECTOMY    Family history: Family History  Problem Relation Age of Onset  . Heart disease Father     MI 19  . Urticaria Father   . Prostate cancer Brother   . Prostate cancer Paternal Uncle   . Prostate cancer Paternal Uncle   . Prostate cancer Paternal Uncle   . Colon cancer Neg Hx   . Allergic rhinitis Neg Hx   . Asthma Neg Hx   . Eczema Neg Hx   . Hyperlipidemia    . Stroke    . Hypertension    . ADD / ADHD      Social history: Social History   Social History  . Marital Status: Married    Spouse Name: N/A  . Number of Children: 2  . Years of Education: N/A   Occupational History  . 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 Smoking status: Former Smoker -- 1.00 packs/day for 35 years    Types: Pipe, Cigarettes     Quit date: 02/23/1992  . Smokeless tobacco: Never Used     Comment: quit 20 years ago  . Alcohol Use: Yes     Comment: 2 drinks daily scotch  AND WINE WITH SUPPER  . Drug Use: No  . Sexual Activity: Not on file   Other Topics Concern  . Not on file   Social History Narrative   Married - second marriage   He has two children   Retired Horticulturist, commercial Professor   Currently teaches pottery making, 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[facility]   Former Smoker quit 12 years ago- smoked for 35 years   Alcohol use- yes   Environmental History:  [name] lives in a 80 year old house with hardwood floors throughout and central air/heat.  There is a cat in the house which has access to his bedroom.  He is a former cigarette smoker having smoked for 35 years and quit 12 years ago.    Medication List       This list is accurate as of: 02/12/15  1:02 PM.  Always use your most recent med list.               acetaminophen 500 MG tablet  Commonly known as:  TYLENOL  Take 500 mg by mouth every 4 (four) hours as needed (pain). Reported on 02/12/2015     albuterol 108 (90 Base) MCG/ACT inhaler  Commonly known as:  PROVENTIL HFA;VENTOLIN HFA  inhale 1 to 2 puffs every 4 to 6 hours if needed for cough or wheezing     ALPRAZolam 0.25 MG tablet  Commonly known as:  XANAX  Take 0.125-0.25 mg by mouth 2 (two) times daily as needed for anxiety.     amLODipine 10 MG tablet  Commonly known as:  NORVASC  Take 1 tablet (10 mg total) by mouth every morning.     azelastine 0.1 % nasal spray  Commonly known as:  ASTELIN  Use 1-2 sprays in each nostril twice daily as needed for stuffy nose or drainage.     beclomethasone 80 MCG/ACT inhaler  Commonly known as:  QVAR  Reported on 02/12/2015     benazepril-hydrochlorthiazide 20-12.5 MG tablet  Commonly known as:  LOTENSIN HCT  take 1 tablet  by mouth once daily     BREO ELLIPTA IN  Inhale into the lungs.     budesonide-formoterol 160-4.5 MCG/ACT inhaler  Commonly  known as:  SYMBICORT  Inhale 2 puffs into the lungs 2 (two) times daily.     CHOLECALCIFEROL PO  Take 800 Units by mouth daily.     ferrous sulfate 325 (65 FE) MG tablet  Take 325 mg by mouth daily with breakfast.     finasteride 5 MG tablet  Commonly known as:  PROSCAR  Take 1 tablet (5 mg total) by mouth daily.     fluorometholone 0.1 % ophthalmic suspension  Commonly known as:  FML     fluticasone 50 MCG/ACT nasal spray  Commonly known as:  FLONASE  Place 1 spray into both nostrils daily as needed for allergies or rhinitis.     fluticasone 50 MCG/ACT nasal spray  Commonly known as:  FLONASE  Use 1-2 sprays twice daily as needed for stuffy nose or drainage.     furosemide 20 MG tablet  Commonly known as:  LASIX  Take 20 mg by mouth 2 (two) times daily.     Lutein-Zeaxanthin 6-0.24 MG Caps  Take 1 capsule by mouth daily.     magnesium 30 MG tablet  Take 30 mg by mouth.     montelukast 10 MG tablet  Commonly known as:  SINGULAIR  take 1 tablet by mouth at bedtime     omeprazole 20 MG capsule  Commonly known as:  PRILOSEC  Take 20 mg by mouth daily.     potassium chloride SA 20 MEQ tablet  Commonly known as:  K-DUR,KLOR-CON  Take 20 mEq by mouth 2 (two) times daily.     POTASSIUM CITRATE PO  Take 550 mg by mouth. 550 mg PO daily     prednisoLONE acetate 1 % ophthalmic suspension  Commonly known as:  PRED FORTE  Apply 1 drop to eye 4 (four) times daily. Reported on 02/12/2015     RED YEAST RICE PO  Take 2 capsules by mouth daily.     rOPINIRole 1 MG tablet  Commonly known as:  REQUIP  Take 1 tablet (1 mg total) by mouth at bedtime.     sodium chloride 0.65 % Soln nasal spray  Commonly known as:  OCEAN  Place 1 spray into both nostrils daily.     traMADol 50 MG tablet  Commonly known as:  ULTRAM  Take 50 mg by mouth as needed.     travoprost (benzalkonium) 0.004 % ophthalmic solution  Commonly known as:  TRAVATAN  1 drop at bedtime.     traZODone 50  MG tablet  Commonly known as:  DESYREL  Take 50 mg by mouth at bedtime as needed.     triazolam 0.25 MG tablet  Commonly known as:  HALCION  Take 1 tablet by mouth at bedtime as needed.        Known medication allergies: Allergies  Allergen Reactions  . Molds & Smuts Other (See Comments)    Also dust mites causes sinus infections, h/a etc.  . Pollen Extract-Tree Extract Other (See Comments)    HEADACHES, TIRED , DRAINAGE FROM SINUSES    I appreciate the opportunity to take part in this [name]'s care. Please do not hesitate to contact me with questions.  Sincerely,   R. Edgar Frisk, MD

## 2015-02-12 NOTE — Assessment & Plan Note (Addendum)
Aeroallergen avoidance measures have been discussed and provided in written form.  A prescription has been provided for azelastine nasal spray, 1-2 sprays per nostril 2 times daily as needed. Proper nasal spray technique has been discussed and demonstrated.   If needed, he may add fluticasone nasal spray.  Continue nasal saline lavage as needed.

## 2015-02-12 NOTE — Patient Instructions (Addendum)
Asthma with COPD ([place])  A prescription has been provided for Symbicort (budesonide/formoterol) 160/4.5 g, 2 inhalations twice a day.  To maximize pulmonary deposition, a spacer has been provided along with instructions for its proper administration with an HFA inhaler.  Continue albuterol HFA every 4-6 hours as needed.  Subjective and objective measures of pulmonary function will be followed and the treatment plan will be adjusted accordingly.  Allergic rhinitis with a nonallergic component  Aeroallergen avoidance measures have been discussed and provided in written form.  A prescription has been provided for azelastine nasal spray, 1-2 sprays per nostril 2 times daily as needed. Proper nasal spray technique has been discussed and demonstrated.   If needed, he may add fluticasone nasal spray.  Continue nasal saline lavage as needed.    Return in about 6 weeks (around 03/26/2015), or if symptoms worsen or fail to improve.  Control of Mold Allergen  Mold and fungi can grow on a variety of surfaces provided certain temperature and moisture conditions exist.  Outdoor molds grow on plants, decaying vegetation and soil.  The major outdoor mold, Alternaria and Cladosporium, are found in very high numbers during hot and dry conditions.  Generally, a late Summer - Fall peak is seen for common outdoor fungal spores.  Rain will temporarily lower outdoor mold spore count, but counts rise rapidly when the rainy period ends.  The most important indoor molds are Aspergillus and Penicillium.  Dark, humid and poorly ventilated basements are ideal sites for mold growth.  The next most common sites of mold growth are the bathroom and the kitchen.  Outdoor [employer] 1. Use air conditioning and keep windows closed 2. Avoid exposure to decaying vegetation. 3. Avoid leaf raking. 4. Avoid grain handling. 5. Consider wearing a face mask if working in moldy areas.  Indoor Mold Control 1. Maintain  humidity below 50%. 2. Clean washable surfaces with 5% bleach solution. 3. Remove sources e.g. Contaminated carpets.  Reducing Pollen Exposure  The American Academy of Allergy, Asthma and Immunology suggests the following steps to reduce your exposure to pollen during allergy seasons.    1. Do not hang sheets or clothing out to dry; pollen may collect on these items. 2. Do not mow lawns or spend time around freshly cut grass; mowing stirs up pollen. 3. Keep windows closed at night.  Keep car windows closed while driving. 4. Minimize morning activities outdoors, a time when pollen counts are usually at their highest. 5. Stay indoors as much as possible when pollen counts or humidity is high and on windy days when pollen tends to remain in the air longer. 6. Use air conditioning when possible.  Many air conditioners have filters that trap the pollen spores. 7. Use a HEPA room air filter to remove pollen form the indoor air you breathe.   Control of Dog or Cat Allergen  Avoidance is the best way to manage a dog or cat allergy. If you have a dog or cat and are allergic to dog or cats, consider removing the dog or cat from the home. If you have a dog or cat but don't want to find it a new home, or if your family wants a pet even though someone in the household is allergic, here are some strategies that may help keep symptoms at bay:  1. Keep the pet out of your bedroom and restrict it to only a few rooms. Be advised that keeping the dog or cat in only one room will  not limit the allergens to that room. 2. Don't pet, hug or kiss the dog or cat; if you do, wash your hands with soap and water. 3. High-efficiency particulate air (HEPA) cleaners run continuously in a bedroom or living room can reduce allergen levels over time. 4. Regular use of a high-efficiency vacuum cleaner or a central vacuum can reduce allergen levels. 5. Giving your dog or cat a bath at least once a week can reduce airborne  allergen.

## 2015-02-12 NOTE — Assessment & Plan Note (Addendum)
A prescription has been provided for Symbicort (budesonide/formoterol) 160/4.5 g, 2 inhalations twice a day.  To maximize pulmonary deposition, a spacer has been provided along with instructions for its proper administration with an HFA inhaler.  Continue albuterol HFA every 4-6 hours as needed.  Subjective and objective measures of pulmonary function will be followed and the treatment plan will be adjusted accordingly.

## 2015-03-21 ENCOUNTER — Encounter: Payer: Self-pay | Admitting: Cardiovascular Disease

## 2015-03-21 ENCOUNTER — Ambulatory Visit (INDEPENDENT_AMBULATORY_CARE_PROVIDER_SITE_OTHER): Payer: PPO | Admitting: Cardiovascular Disease

## 2015-03-21 VITALS — BP 158/98 | HR 68 | Ht 67.0 in | Wt 188.0 lb

## 2015-03-21 DIAGNOSIS — R609 Edema, unspecified: Secondary | ICD-10-CM

## 2015-03-21 DIAGNOSIS — I10 Essential (primary) hypertension: Secondary | ICD-10-CM | POA: Diagnosis not present

## 2015-03-21 NOTE — Patient Instructions (Addendum)
Medication Instructions:  Your physician recommends that you continue on your current medications as directed. Please refer to the Current Medication list given to you today.   Labwork: none  Testing/Procedures: none  Follow-Up: Your physician recommends that you schedule a follow-up appointment in: with Erasmo Downer, PharmD - Medication Management - Next Avaliable  We request that you follow-up in: 6 months with an extender and in 12 months with Dr Andria Rhein will receive a reminder letter in the mail two months in advance. If you don't receive a letter, please call our office to schedule the follow-up appointment.    Any Other Special Instructions Will Be Listed Below (If Applicable).     If you need a refill on your cardiac medications before your next appointment, please call your pharmacy.

## 2015-03-21 NOTE — Assessment & Plan Note (Signed)
History of hypertension blood pressure measured at 158/98 although his blood pressures have been much better than this. He is on amlodipine, benazepril and hydrochlorothiazide. Continue current meds at current dosing

## 2015-03-21 NOTE — Assessment & Plan Note (Signed)
Bilateral lower extremity edema with normal 2-D echocardiogram although he does have grade 1 diastolic dysfunction with moderate pulmonary hypertension probably related to COPD. He is on diuretics and amlodipine for hypertension. He admits to dietary indiscretion with regards to salt. His diuretics were recently increased by his primary care provider. It's possible that part of his edema may be related to by his Norvasc and I I'm going to refer him to [employer] [facility] , to wean him off his calcium channel blocker and replace it with another drug such as hydralazine.

## 2015-03-21 NOTE — Assessment & Plan Note (Signed)
History of hypertension with blood pressure measured today at 158/98.  Blood pressure is usually better than this. He is on amlodipine, benazepril and hydrochlorothiazide. Continue current meds at current dosing

## 2015-03-21 NOTE — Progress Notes (Signed)
03/21/2015 Ryan [name]   September 18, 1931  [id number]  Primary Physician Chesley Noon, MD Primary Cardiologist: Lorretta Harp MD Renae Gloss   HPI:  Mr. 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 He was referred by Dr. Melford Aase for cardiovascular evaluation because of lower extremity edema. I last saw him in the office 04/01/14.His cardiac resector profiles are notable for treated hypertension. He smoked tobacco remotely having stopped 25-30 years ago. He's never had a heart attack or stroke and denies chest pain or shortness of breath. He did have back surgery performed by Dr. Ellene Route December of last year after which she had urinary obstruction and lower extremity edema which resolved with relief of the obstruction and additional diuretics.  He is aware of salt restriction but does admit to dietary indiscretion. His edema had significantly improved with the addition of low-dose diuretics and these were recently increased by his primary care provider. A 2-D echo was performed showed normal LV function with grade 1 diastolic dysfunction and moderate pulmonary hypertension. He does admit to orthopnea as well.  Current Outpatient Prescriptions  Medication Sig Dispense Refill  . acetaminophen (TYLENOL) 500 MG tablet Take 500 mg by mouth every 4 (four) hours as needed (pain). Reported on 02/12/2015    . albuterol (PROVENTIL HFA;VENTOLIN HFA) 108 (90 BASE) MCG/ACT inhaler inhale 1 to 2 puffs every 4 to 6 hours if needed for cough or wheezing    . ALPRAZolam (XANAX) 0.25 MG tablet Take 0.125-0.25 mg by mouth 2 (two) times daily as needed for anxiety.     Marland Kitchen amLODipine (NORVASC) 10 MG tablet Take 1 tablet (10 mg total) by mouth every morning. 30 tablet 11  . azelastine (ASTELIN) 0.1 % nasal spray Use 1-2 sprays in each nostril twice daily as needed for stuffy nose or  drainage. 30 mL 5  . beclomethasone (QVAR) 80 MCG/ACT inhaler Reported on 02/12/2015    . benazepril-hydrochlorthiazide (LOTENSIN HCT) 20-12.5 MG per tablet take 1 tablet by mouth once daily 30 tablet 5  . budesonide-formoterol (SYMBICORT) 160-4.5 MCG/ACT inhaler Inhale 2 puffs into the lungs 2 (two) times daily. 1 Inhaler 3  . CHOLECALCIFEROL PO Take 800 Units by mouth daily.     . ferrous sulfate 325 (65 FE) MG tablet Take 325 mg by mouth daily with breakfast.    . finasteride (PROSCAR) 5 MG tablet Take 1 tablet (5 mg total) by mouth daily. 30 tablet 5  . fluorometholone (FML) 0.1 % ophthalmic suspension   0  . fluticasone (FLONASE) 50 MCG/ACT nasal spray Place 1 spray into both nostrils daily as needed for allergies or rhinitis.    . fluticasone (FLONASE) 50 MCG/ACT nasal spray Use 1-2 sprays twice daily as needed for stuffy nose or drainage. 16 g 5  . Fluticasone Furoate-Vilanterol (BREO ELLIPTA IN) Inhale into the lungs.    . furosemide (LASIX) 20 MG tablet Take 20 mg by mouth 2 (two) times daily.    . Lutein-Zeaxanthin 6-0.24 MG CAPS Take 1 capsule by mouth daily.     . magnesium 30 MG tablet Take 30 mg by mouth.    . montelukast (SINGULAIR) 10 MG tablet take 1 tablet by mouth at bedtime    . omeprazole (PRILOSEC) 20 MG capsule Take 20 mg by mouth daily.    . potassium chloride SA (K-DUR,KLOR-CON) 20 MEQ tablet Take 20 mEq by mouth 2 (  two) times daily.    Marland Kitchen POTASSIUM CITRATE PO Take 550 mg by mouth. 550 mg PO daily    . prednisoLONE acetate (PRED FORTE) 1 % ophthalmic suspension Apply 1 drop to eye 4 (four) times daily. Reported on 02/12/2015    . Red Yeast Rice Extract (RED YEAST RICE PO) Take 2 capsules by mouth daily.     Marland Kitchen rOPINIRole (REQUIP) 1 MG tablet Take 1 tablet (1 mg total) by mouth at bedtime. 90 tablet 3  . sodium chloride (OCEAN) 0.65 % SOLN nasal spray Place 1 spray into both nostrils daily.    . traMADol (ULTRAM) 50 MG tablet Take 50 mg by mouth as needed.    . travoprost,  benzalkonium, (TRAVATAN) 0.004 % ophthalmic solution 1 drop at bedtime.    . traZODone (DESYREL) 50 MG tablet Take 50 mg by mouth at bedtime as needed.    . triazolam (HALCION) 0.25 MG tablet Take 1 tablet by mouth at bedtime as needed.     Current Facility-Administered Medications  Medication Dose Route Frequency Provider Last Rate Last Dose  . ipratropium (ATROVENT) nebulizer solution 0.5 mg  0.5 mg Nebulization Once Adelina Mings, MD      . levalbuterol Penne Lash) nebulizer solution 1.25 mg  1.25 mg Nebulization Once Adelina Mings, MD        Allergies  Allergen Reactions  . Molds & Smuts Other (See Comments)    Also dust mites causes sinus infections, h/a etc.  . Pollen Extract-Tree Extract Other (See Comments)    HEADACHES, TIRED , DRAINAGE FROM SINUSES    Social History   Social History  . Marital Status: Married    Spouse Name: N/A  . Number of Children: 2  . Years of Education: N/A   Occupational History  . 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 Smoking status: Former Smoker -- 1.00 packs/day for 35 years    Types: Pipe, Cigarettes    Quit date: 02/23/1992  . Smokeless tobacco: Never Used     Comment: quit 20 years ago  . Alcohol Use: Yes     Comment: 2 drinks daily scotch  AND WINE WITH SUPPER  . Drug Use: No  . Sexual Activity: Not on file   Other Topics Concern  . Not on file   Social History Narrative   Married - second marriage   He has two children   Retired Horticulturist, commercial Professor   Currently teaches pottery making, 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[facility]   Former Smoker quit 12 years ago- smoked for 35 years   Alcohol use- yes     Review of Systems: General: negative for chills, fever, night sweats or weight changes.  Cardiovascular: negative for chest pain, dyspnea on exertion, edema, orthopnea, palpitations, paroxysmal nocturnal dyspnea or shortness of breath Dermatological: negative for  rash Respiratory: negative for cough or wheezing Urologic: negative for hematuria Abdominal: negative for nausea, vomiting, diarrhea, bright red blood per rectum, melena, or hematemesis Neurologic: negative for visual changes, syncope, or dizziness All other systems reviewed and are otherwise negative except as noted above.    Blood pressure 158/98, pulse 68, height _0  (1.702 m), weight 188 lb (85.276 kg).  General appearance: alert and no distress Neck: no adenopathy, no carotid bruit, no JVD, supple, symmetrical, trachea midline and thyroid not enlarged, symmetric, no tenderness/mass/nodules Lungs: clear to auscultation bilaterally Heart: regular rate and rhythm, S1, S2 normal, no murmur,  click, rub or gallop Extremities: 2+ pitting edema bilaterally  EKG not performed today  ASSESSMENT AND PLAN:   HYPERTENSION History of hypertension blood pressure measured at 158/98 although his blood pressures have been much better than this. He is on amlodipine, benazepril and hydrochlorothiazide. Continue current meds at current dosing  PEDAL EDEMA Bilateral lower extremity edema with normal 2-D echocardiogram although he does have grade 1 diastolic dysfunction with moderate pulmonary hypertension probably related to COPD. He is on diuretics and amlodipine for hypertension. He admits to dietary indiscretion with regards to salt. His diuretics were recently increased by his primary care provider. It's possible that part of his edema may be related to by his Norvasc and I I'm going to refer him to [employer] [facility] , to wean him off his calcium channel blocker and replace it with another drug such as hydralazine.  Essential hypertension History of hypertension with blood pressure measured today at 158/98.  Blood pressure is usually better than this. He is on amlodipine, benazepril and hydrochlorothiazide. Continue current meds at current dosing      Lorretta Harp MD [facility],  [facility] 03/21/2015 2:22 PM

## 2015-03-26 ENCOUNTER — Encounter: Payer: Self-pay | Admitting: Allergy and Immunology

## 2015-03-26 ENCOUNTER — Ambulatory Visit (INDEPENDENT_AMBULATORY_CARE_PROVIDER_SITE_OTHER): Payer: PPO | Admitting: Allergy and Immunology

## 2015-03-26 VITALS — BP 142/80 | HR 70 | Temp 98.0°F | Resp 18

## 2015-03-26 DIAGNOSIS — J45909 Unspecified asthma, uncomplicated: Secondary | ICD-10-CM

## 2015-03-26 DIAGNOSIS — J449 Chronic obstructive pulmonary disease, unspecified: Secondary | ICD-10-CM

## 2015-03-26 DIAGNOSIS — J3089 Other allergic rhinitis: Secondary | ICD-10-CM

## 2015-03-26 NOTE — Patient Instructions (Addendum)
Asthma with COPD ([place]) We will step up therapy at this time for better control. Consideration of glaucoma is warranted and input from optometrist/ophthalmologist will be helpful regarding increasing ICS dose vs adding tiotropium (Spiriva).  For now, add Qvar 80 g, one inhalation via spacer device twice a day.  A sample has been provided.  Continue/restart Symbicort 160/4.5, 2 inhalations via spacer device twice a day. Compliance with recommended dosing has been encouraged.  Continue montelukast 10 mg daily bedtime and albuterol every 4-6 hours as needed.  [name] has been asked to follow up soon with his optometrist/ophthalmologist and copy me on any correspondence.  Allergic rhinitis with a nonallergic component [name] has failed azelastine nasal spray.  For now continue fluticasone nasal spray and nasal saline irrigation as needed.   Will request input from [name]'s optometrist/ophthalmologist regarding continuing fluticasone vs switching to ipratropium nasal spray.    Return in about 6 weeks (around 05/07/2015), or if symptoms worsen or fail to improve.

## 2015-03-26 NOTE — Assessment & Plan Note (Addendum)
We will step up therapy at this time for better control. Consideration of glaucoma is warranted and input from optometrist/ophthalmologist will be helpful regarding increasing ICS dose vs adding tiotropium (Spiriva).  For now, add Qvar 80 g, one inhalation via spacer device twice a day.  A sample has been provided.  Continue/restart Symbicort 160/4.5, 2 inhalations via spacer device twice a day. Compliance with recommended dosing has been encouraged.  Continue montelukast 10 mg daily bedtime and albuterol every 4-6 hours as needed.  Ryan [name] has been asked to follow up soon with his optometrist/ophthalmologist and copy me on any correspondence.

## 2015-03-26 NOTE — Progress Notes (Signed)
Follow-up Note  RE: Ryan [name] MRN: [id number] DOB: February 06, 1931 Date of Office Visit: 03/26/2015  Primary care provider: Chesley Noon, MD Referring provider: Chesley Noon, MD  History of present illness: HPI Comments: Ryan [name] is a 82 y.o. male with asthma/COPD and mixed rhinitis who presents today for follow up.  He was last seen in this office on 02/12/2015.  He reports that over the past 2 weeks he has required albuterol rescue 2-4 times per day.  He admits that he had only been one inhalation twice a day of Symbicort 160/4.5 g rather than 2 inhalations via spacer device twice a day.  Over the past week, he did resume the recommended dosing of Symbicort that has still required albuterol rescue at least once daily.  Ryan [name] notes today that he has a diagnosis of "early glaucoma."  He has not seen his optometrist recently.  He reports that azelastine nasal spray causes increased nasal congestion rather than symptom relief.   Assessment and plan: Asthma with COPD ([place]) We will step up therapy at this time for better control. Consideration of glaucoma is warranted and input from optometrist/ophthalmologist will be helpful regarding increasing ICS dose vs adding tiotropium (Spiriva).  For now, add Qvar 80 g, one inhalation via spacer device twice a day.  A sample has been provided.  Continue/restart Symbicort 160/4.5, 2 inhalations via spacer device twice a day. Compliance with recommended dosing has been encouraged.  Continue montelukast 10 mg daily bedtime and albuterol every 4-6 hours as needed.  Ryan [name] has been asked to follow up soon with his optometrist/ophthalmologist and copy me on any correspondence.  Allergic rhinitis with a nonallergic component Ryan [name] has failed azelastine nasal spray.  For now continue fluticasone nasal spray and nasal saline irrigation as needed.   Will request input from [name]'s optometrist/ophthalmologist regarding continuing fluticasone  vs switching to ipratropium nasal spray.   Diagnositics: Spirometry reveals FVC of 1.75 L and FEV1 of 1.52 L (66% predicted) without post bronchodilator improvement.    Physical examination: Blood pressure 142/80, pulse 70, temperature 98 F (36.7 C), resp. rate 18.  General: Alert, interactive, in no acute distress. HEENT: TMs pearly gray, turbinates moderately edematous without discharge, post-pharynx mildly erythematous. Neck: Supple without lymphadenopathy. Lungs: Mildly decreased breath sounds bilaterally without wheezing, rhonchi or rales. CV: Normal S1, S2 without murmurs. Skin: Warm and dry, without lesions or rashes.  The following portions of the patient's history were reviewed and updated as appropriate: allergies, current medications, past family history, past medical history, past social history, past surgical history and problem list.    Medication List       This list is accurate as of: 03/26/15  1:10 PM.  Always use your most recent med list.               acetaminophen 500 MG tablet  Commonly known as:  TYLENOL  Take 500 mg by mouth every 4 (four) hours as needed (pain). Reported on 02/12/2015     albuterol 108 (90 Base) MCG/ACT inhaler  Commonly known as:  PROVENTIL HFA;VENTOLIN HFA  inhale 1 to 2 puffs every 4 to 6 hours if needed for cough or wheezing     ALPRAZolam 0.25 MG tablet  Commonly known as:  XANAX  Take 0.125-0.25 mg by mouth 2 (two) times daily as needed for anxiety.     amLODipine 10 MG tablet  Commonly known as:  NORVASC  Take 1 tablet (10 mg total) by mouth  every morning.     azelastine 0.1 % nasal spray  Commonly known as:  ASTELIN  Use 1-2 sprays in each nostril twice daily as needed for stuffy nose or drainage.     beclomethasone 80 MCG/ACT inhaler  Commonly known as:  QVAR  Reported on 02/12/2015     benazepril-hydrochlorthiazide 20-12.5 MG tablet  Commonly known as:  LOTENSIN HCT  take 1 tablet by mouth once daily      budesonide-formoterol 160-4.5 MCG/ACT inhaler  Commonly known as:  SYMBICORT  Inhale 2 puffs into the lungs 2 (two) times daily.     CHOLECALCIFEROL PO  Take 800 Units by mouth daily.     ferrous sulfate 325 (65 FE) MG tablet  Take 325 mg by mouth daily with breakfast. Reported on 03/26/2015     finasteride 5 MG tablet  Commonly known as:  PROSCAR  Take 1 tablet (5 mg total) by mouth daily.     fluorometholone 0.1 % ophthalmic suspension  Commonly known as:  FML     furosemide 20 MG tablet  Commonly known as:  LASIX  Take 20 mg by mouth 2 (two) times daily.     Lutein-Zeaxanthin 6-0.24 MG Caps  Take 1 capsule by mouth daily.     magnesium 30 MG tablet  Take 30 mg by mouth. Reported on 03/26/2015     montelukast 10 MG tablet  Commonly known as:  SINGULAIR  take 1 tablet by mouth at bedtime     omeprazole 20 MG capsule  Commonly known as:  PRILOSEC  Take 20 mg by mouth daily.     potassium chloride SA 20 MEQ tablet  Commonly known as:  K-DUR,KLOR-CON  Take 20 mEq by mouth 2 (two) times daily.     POTASSIUM CITRATE PO  Take 550 mg by mouth. 550 mg PO daily     prednisoLONE acetate 1 % ophthalmic suspension  Commonly known as:  PRED FORTE  Apply 1 drop to eye 4 (four) times daily. Reported on 02/12/2015     RED YEAST RICE PO  Take 2 capsules by mouth daily.     rOPINIRole 1 MG tablet  Commonly known as:  REQUIP  Take 1 tablet (1 mg total) by mouth at bedtime.     sodium chloride 0.65 % Soln nasal spray  Commonly known as:  OCEAN  Place 1 spray into both nostrils daily.     traMADol 50 MG tablet  Commonly known as:  ULTRAM  Take 50 mg by mouth as needed.     travoprost (benzalkonium) 0.004 % ophthalmic solution  Commonly known as:  TRAVATAN  1 drop at bedtime.     traZODone 50 MG tablet  Commonly known as:  DESYREL  Take 50 mg by mouth at bedtime as needed.     triazolam 0.25 MG tablet  Commonly known as:  HALCION  Take 1 tablet by mouth at bedtime as  needed.        Allergies  Allergen Reactions  . Molds & Smuts Other (See Comments)    Also dust mites causes sinus infections, h/a etc.  . Pollen Extract-Tree Extract Other (See Comments)    HEADACHES, TIRED , DRAINAGE FROM SINUSES   Review of systems: Constitutional: Negative for fever, chills and weight loss.  HENT: Negative for nosebleeds.   Positive for nasal congestion. Eyes: Negative for blurred vision.  Respiratory: Negative for hemoptysis.   Positive for dyspnea and wheezing. Cardiovascular: Negative for chest pain.  Gastrointestinal: Negative  for diarrhea and constipation.  Genitourinary: Negative for dysuria.  Musculoskeletal: Negative for myalgias and joint pain.  Neurological: Negative for dizziness.  Endo/Heme/Allergies: Does not bruise/bleed easily.   Past Medical History  Diagnosis Date  . Hyperlipemia   . Hypertension   . Benign prostatic hypertrophy   . Chronic fatigue   . Lactose intolerance   . Sleep disorder     DOES NOT SLEEP WELL  . Personal history of colonic polyps 2004    hyperplastic Dr. Collene Mares  . Gluten intolerance   . Barrett's esophagus   . Esophageal reflux   . Diverticulosis of colon (without mention of hemorrhage)   . Periaortic lymphadenopathy 02/16/2011  . Atelectasis, bilateral 02/10/11    Noted On CT Scan - Mild Dependent Atelectasis at the Lung Bases  . Hiatal hernia 02/10/11    Noted on CT Scan - Moderate Hiatal Hernia  . Renal cyst 02/10/11     Noted on CT Scan - Bilateral Renal Cysts  . S/P radiation therapy 03/15/11 - 04/09/11    Abdominal/ Pelvic Tumor, 3600 cGy/20 Fractions  . Microcytic anemia 04/20/2011   . Abdominal pain     Bloating and gas  . Gastritis   . Dysrhythmia     SKIPPED HEART BEAT SOMETIMES  . Environmental allergies   . Pain     LOWER BACK PAIN - DDD, SCOLIOSIS, BONE SPUR.  FINISHED THE 3RD EPIDURAL STEROID INJECTION 7/15 - DONE BY DR. Nelva Bush. - STILL HAVING BACK PAIN  . Osteoarthritis     hands/feet,knees, NECK,  BACK  . Congestion of throat     mouth breath at night, dry mouth  . Bilateral lower extremity edema   . Hypertension   . Melanoma ([place])     lymphoma  . Lymphoma of lymph nodes in pelvis ([employer]) 03/03/2011     Large Right Retroperitoneal Mass  . 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 Heart disease Father     MI 49  . Urticaria Father   . Prostate cancer Brother   . Prostate cancer Paternal Uncle   . Prostate cancer Paternal Uncle   . Prostate cancer Paternal Uncle   . Colon cancer Neg Hx   . Allergic rhinitis Neg Hx   . Asthma Neg Hx   . Eczema Neg Hx   . Hyperlipidemia    . Stroke    . Hypertension    . ADD / ADHD      Social History   Social History  . Marital Status: Married    Spouse Name: N/A  . Number of Children: 2  . Years of Education: N/A   Occupational History  . 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 Smoking status: Former Smoker -- 1.00 packs/day for 35 years    Types: Pipe, Cigarettes    Quit date: 02/23/1992  . Smokeless tobacco: Never Used     Comment: quit 20 years ago  . Alcohol Use: Yes     Comment: 2 drinks daily scotch  AND WINE WITH SUPPER  . Drug Use: No  . Sexual Activity: Not on file   Other Topics Concern  . Not on file   Social History Narrative   Married - second marriage   He has two children   Retired Horticulturist, commercial Professor   Currently teaches pottery  making, 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[facility]   Former Smoker quit 12 years ago- smoked for 35 years   Alcohol use- yes    I appreciate the opportunity to take part in this Miguel's care. Please do not hesitate to contact me with questions.  Sincerely,   R. Edgar Frisk, MD

## 2015-03-26 NOTE — Assessment & Plan Note (Addendum)
Ryan [name] has failed azelastine nasal spray.  For now continue fluticasone nasal spray and nasal saline irrigation as needed.   Will request input from [name]'s optometrist/ophthalmologist regarding continuing fluticasone vs switching to ipratropium nasal spray.

## 2015-03-28 DIAGNOSIS — H353131 Nonexudative age-related macular degeneration, bilateral, early dry stage: Secondary | ICD-10-CM | POA: Diagnosis not present

## 2015-03-28 DIAGNOSIS — H401131 Primary open-angle glaucoma, bilateral, mild stage: Secondary | ICD-10-CM | POA: Diagnosis not present

## 2015-04-03 ENCOUNTER — Ambulatory Visit (INDEPENDENT_AMBULATORY_CARE_PROVIDER_SITE_OTHER): Payer: PPO | Admitting: Pharmacist Clinician (PhC)/ Clinical Pharmacy Specialist

## 2015-04-03 VITALS — BP 144/86 | Ht 67.0 in | Wt 184.3 lb

## 2015-04-03 DIAGNOSIS — I10 Essential (primary) hypertension: Secondary | ICD-10-CM | POA: Diagnosis not present

## 2015-04-03 NOTE — Patient Instructions (Signed)
Return for a a follow up appointment in 3 weeks  Your blood pressure today is 144/86  (goal is <150/90)  Check your blood pressure at home daily and keep record of the readings.  Take your BP meds as follows: decrease amlodipine to 5 mg (cut tablets in half) and take at night; increase benazepril/hctz to 2 daily and take in the mornings  Bring all of your meds, your BP cuff and your record of home blood pressures to your next appointment.  Exercise as you're able, try to walk approximately 30 minutes per day.  Keep salt intake to a minimum, especially watch canned and prepared boxed foods.  Eat more fresh fruits and vegetables and fewer canned items.  Avoid eating in fast food restaurants.    HOW TO TAKE YOUR BLOOD PRESSURE: . Rest 5 minutes before taking your blood pressure. .  Don't smoke or drink caffeinated beverages for at least 30 minutes before. . Take your blood pressure before (not after) you eat. . Sit comfortably with your back supported and both feet on the floor (don't cross your legs). . Elevate your arm to heart level on a table or a desk. . Use the proper sized cuff. It should fit smoothly and snugly around your bare upper arm. There should be enough room to slip a fingertip under the cuff. The bottom edge of the cuff should be 1 inch above the crease of the elbow. . Ideally, take 3 measurements at one sitting and record the average.

## 2015-04-06 ENCOUNTER — Encounter: Payer: Self-pay | Admitting: Pharmacist Clinician (PhC)/ Clinical Pharmacy Specialist

## 2015-04-06 NOTE — Progress Notes (Signed)
04/06/2015 Ryan [name] 07-01-1931 [id number]   HPI:  Ryan [name] is a 82 y.o. male patient of Dr Gwenlyn Found with a PMH below who presents today for hypertension clinic evaluation.  He has had problems with lower extremity edema bilaterally, grade 1 diastolic dysfunction and moderate pulmonary hypertension.  Unsure if the LEE is related to amlodipine use.  Patient was referred to hypertension clinic to taper off amlodipine and replace accordingly.  Today he tells me that the edema has decreased  Cardiac Hx: grade 1 diastolic dysfunction, pulmonary hypertension, COPD,   Family Hx: father had MI at 48  Social Hx: quit smoking in 1994, does drink alcohol regulary  Diet: has been trying to cut back on sodium, although does add a little to some foods at the table  Home BP readings: checked x 2, both at 140/90 range per patient  Current antihypertensive medications: benazepril hct 20/12.5 qam, amlodipine 10 mg qam   Wt Readings from Last 3 Encounters:  04/06/15 184 lb 4.8 oz (83.598 kg)  03/21/15 188 lb (85.276 kg)  02/12/15 180 lb 12.4 oz (82 kg)   BP Readings from Last 3 Encounters:  04/06/15 144/86  03/26/15 142/80  03/21/15 158/98   Pulse Readings from Last 3 Encounters:  03/26/15 70  03/21/15 68  02/12/15 74    Current Outpatient Prescriptions  Medication Sig Dispense Refill  . acetaminophen (TYLENOL) 500 MG tablet Take 500 mg by mouth every 4 (four) hours as needed (pain). Reported on 02/12/2015    . albuterol (PROVENTIL HFA;VENTOLIN HFA) 108 (90 BASE) MCG/ACT inhaler inhale 1 to 2 puffs every 4 to 6 hours if needed for cough or wheezing    . ALPRAZolam (XANAX) 0.25 MG tablet Take 0.125-0.25 mg by mouth 2 (two) times daily as needed for anxiety.     Marland Kitchen amLODipine (NORVASC) 10 MG tablet Take 1 tablet (10 mg total) by mouth every morning. 30 tablet 11  . azelastine (ASTELIN) 0.1 % nasal spray Use 1-2 sprays in each nostril twice daily as needed for stuffy nose or  drainage. 30 mL 5  . beclomethasone (QVAR) 80 MCG/ACT inhaler Reported on 02/12/2015    . benazepril-hydrochlorthiazide (LOTENSIN HCT) 20-12.5 MG per tablet take 1 tablet by mouth once daily 30 tablet 5  . budesonide-formoterol (SYMBICORT) 160-4.5 MCG/ACT inhaler Inhale 2 puffs into the lungs 2 (two) times daily.    . CHOLECALCIFEROL PO Take 800 Units by mouth daily.     . ferrous sulfate 325 (65 FE) MG tablet Take 325 mg by mouth daily with breakfast. Reported on 03/26/2015    . finasteride (PROSCAR) 5 MG tablet Take 1 tablet (5 mg total) by mouth daily. 30 tablet 5  . fluorometholone (FML) 0.1 % ophthalmic suspension   0  . furosemide (LASIX) 20 MG tablet Take 20 mg by mouth 2 (two) times daily.    . Lutein-Zeaxanthin 6-0.24 MG CAPS Take 1 capsule by mouth daily.     . magnesium 30 MG tablet Take 30 mg by mouth. Reported on 03/26/2015    . montelukast (SINGULAIR) 10 MG tablet take 1 tablet by mouth at bedtime    . omeprazole (PRILOSEC) 20 MG capsule Take 20 mg by mouth daily.    . potassium chloride SA (K-DUR,KLOR-CON) 20 MEQ tablet Take 20 mEq by mouth 2 (two) times daily.    Marland Kitchen POTASSIUM CITRATE PO Take 550 mg by mouth. 550 mg PO daily    . prednisoLONE acetate (PRED FORTE) 1 %  ophthalmic suspension Apply 1 drop to eye 4 (four) times daily. Reported on 02/12/2015    . Red Yeast Rice Extract (RED YEAST RICE PO) Take 2 capsules by mouth daily.     Marland Kitchen rOPINIRole (REQUIP) 1 MG tablet Take 1 tablet (1 mg total) by mouth at bedtime. 90 tablet 3  . sodium chloride (OCEAN) 0.65 % SOLN nasal spray Place 1 spray into both nostrils daily.    . traMADol (ULTRAM) 50 MG tablet Take 50 mg by mouth as needed.    . travoprost, benzalkonium, (TRAVATAN) 0.004 % ophthalmic solution 1 drop at bedtime.    . traZODone (DESYREL) 50 MG tablet Take 50 mg by mouth at bedtime as needed.    . triazolam (HALCION) 0.25 MG tablet Take 1 tablet by mouth at bedtime as needed.     Current Facility-Administered Medications    Medication Dose Route Frequency Provider Last Rate Last Dose  . ipratropium (ATROVENT) nebulizer solution 0.5 mg  0.5 mg Nebulization Once Adelina Mings, MD      . levalbuterol Penne Lash) nebulizer solution 1.25 mg  1.25 mg Nebulization Once Adelina Mings, MD        Allergies  Allergen Reactions  . Molds & Smuts Other (See Comments)    Also dust mites causes sinus infections, h/a etc.  . Pollen Extract-Tree Extract Other (See Comments)    HEADACHES, TIRED , DRAINAGE FROM SINUSES    Past Medical History  Diagnosis Date  . Hyperlipemia   . Hypertension   . Benign prostatic hypertrophy   . Chronic fatigue   . Lactose intolerance   . Sleep disorder     DOES NOT SLEEP WELL  . Personal history of colonic polyps 2004    hyperplastic Dr. Collene Mares  . Gluten intolerance   . Barrett's esophagus   . Esophageal reflux   . Diverticulosis of colon (without mention of hemorrhage)   . Periaortic lymphadenopathy 02/16/2011  . Atelectasis, bilateral 02/10/11    Noted On CT Scan - Mild Dependent Atelectasis at the Lung Bases  . Hiatal hernia 02/10/11    Noted on CT Scan - Moderate Hiatal Hernia  . Renal cyst 02/10/11     Noted on CT Scan - Bilateral Renal Cysts  . S/P radiation therapy 03/15/11 - 04/09/11    Abdominal/ Pelvic Tumor, 3600 cGy/20 Fractions  . Microcytic anemia 04/20/2011   . Abdominal pain     Bloating and gas  . Gastritis   . Dysrhythmia     SKIPPED HEART BEAT SOMETIMES  . Environmental allergies   . Pain     LOWER BACK PAIN - DDD, SCOLIOSIS, BONE SPUR.  FINISHED THE 3RD EPIDURAL STEROID INJECTION 7/15 - DONE BY DR. Nelva Bush. - STILL HAVING BACK PAIN  . Osteoarthritis     hands/feet,knees, NECK, BACK  . Congestion of throat     mouth breath at night, dry mouth  . Bilateral lower extremity edema   . Hypertension   . Melanoma ([place])     lymphoma  . Lymphoma of lymph nodes in pelvis ([place]) 03/03/2011     Large Right Retroperitoneal Mass  . 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    Tommy Medal PharmD CPP [facility]

## 2015-04-06 NOTE — Assessment & Plan Note (Signed)
Today his BP is improved at 144/86.  He still has some lower extremity edema, although this has improved somewhat, according to patient.  I am going to decrease his amlodipine from 10 to 5 mg daily and have him move it to evenings.  He will also double his benazepril hctz 20/12.5 mg to 2 tablets each morning.  I asked him to continue with sodium restrictions and check his BP at home several times each week.   I will see him back in 3-4 weeks to see if there is more improvement in LEE without compromising his pressure.

## 2015-04-18 DIAGNOSIS — M4156 Other secondary scoliosis, lumbar region: Secondary | ICD-10-CM | POA: Diagnosis not present

## 2015-04-18 DIAGNOSIS — M5416 Radiculopathy, lumbar region: Secondary | ICD-10-CM | POA: Diagnosis not present

## 2015-04-24 ENCOUNTER — Ambulatory Visit: Payer: PPO | Admitting: Pharmacist Clinician (PhC)/ Clinical Pharmacy Specialist

## 2015-05-01 ENCOUNTER — Ambulatory Visit (INDEPENDENT_AMBULATORY_CARE_PROVIDER_SITE_OTHER): Payer: PPO | Admitting: Pharmacist Clinician (PhC)/ Clinical Pharmacy Specialist

## 2015-05-01 VITALS — BP 108/64 | HR 80 | Ht 67.0 in | Wt 178.8 lb

## 2015-05-01 DIAGNOSIS — I10 Essential (primary) hypertension: Secondary | ICD-10-CM | POA: Diagnosis not present

## 2015-05-01 NOTE — Patient Instructions (Signed)
Return for a a follow up appointment in 6 weeks  Your blood pressure today is 108/64  (goal is to be < 150/90)  Check your blood pressure at home daily and keep record of the readings.  Take your BP meds as follows: divide benazepril hctz to 1 tablet twice daily  Bring all of your meds, your BP cuff and your record of home blood pressures to your next appointment.  Exercise as you're able, try to walk approximately 30 minutes per day.  Keep salt intake to a minimum, especially watch canned and prepared boxed foods.  Eat more fresh fruits and vegetables and fewer canned items.  Avoid eating in fast food restaurants.    HOW TO TAKE YOUR BLOOD PRESSURE: . Rest 5 minutes before taking your blood pressure. .  Don't smoke or drink caffeinated beverages for at least 30 minutes before. . Take your blood pressure before (not after) you eat. . Sit comfortably with your back supported and both feet on the floor (don't cross your legs). . Elevate your arm to heart level on a table or a desk. . Use the proper sized cuff. It should fit smoothly and snugly around your bare upper arm. There should be enough room to slip a fingertip under the cuff. The bottom edge of the cuff should be 1 inch above the crease of the elbow. . Ideally, take 3 measurements at one sitting and record the average.

## 2015-05-01 NOTE — Progress Notes (Signed)
05/01/2015 Ryan [name] 1932-01-02 [id number]   HPI:  Ryan [name] is a 82 y.o. male patient of Dr Gwenlyn Found with a PMH below who presents today for hypertension clinic follow up.  He has had problems with lower extremity edema bilaterally, grade 1 diastolic dysfunction and moderate pulmonary hypertension.  At his last visit we were unsure if the LEE is related to amlodipine use, so he was asked to cut it from 10 mg to 5 mg daily and increase his benazepril/hctz 20/12.5 mg to 2 tablets daily.  Today he comes in and states that after his last visit he could not find his amlodipine prescription, so calling his pharmacy, he discovered he last filled it in September of last year.  Even if that had been a 3 month supply, he has been off for several months.  He does report little to no edema recently.    Cardiac Hx: grade 1 diastolic dysfunction, pulmonary hypertension, COPD,   Family Hx: father had MI at 77  Social Hx: quit smoking in 1994, does drink alcohol regulary  Diet: has been trying to cut back on sodium, although does add a little to some foods at the table  Home BP readings: range from 106-181/70-107; average is 141/87  Current antihypertensive medications: benazepril hct 20/12.5 2 tabs qam   Wt Readings from Last 3 Encounters:  05/01/15 178 lb 12.8 oz (81.103 kg)  04/06/15 184 lb 4.8 oz (83.598 kg)  03/21/15 188 lb (85.276 kg)   BP Readings from Last 3 Encounters:  05/01/15 108/64  04/06/15 144/86  03/26/15 142/80   Pulse Readings from Last 3 Encounters:  05/01/15 80  03/26/15 70  03/21/15 68    Current Outpatient Prescriptions  Medication Sig Dispense Refill  . albuterol (PROVENTIL HFA;VENTOLIN HFA) 108 (90 BASE) MCG/ACT inhaler inhale 1 to 2 puffs every 4 to 6 hours if needed for cough or wheezing    . ALPRAZolam (XANAX) 0.25 MG tablet Take 0.125-0.25 mg by mouth 2 (two) times daily as needed for anxiety.     . beclomethasone (QVAR) 80 MCG/ACT inhaler Reported  on 02/12/2015    . benazepril-hydrochlorthiazide (LOTENSIN HCT) 20-12.5 MG per tablet take 1 tablet by mouth once daily 30 tablet 5  . budesonide-formoterol (SYMBICORT) 160-4.5 MCG/ACT inhaler Inhale 2 puffs into the lungs 2 (two) times daily.    . CHOLECALCIFEROL PO Take 800 Units by mouth daily.     . finasteride (PROSCAR) 5 MG tablet Take 1 tablet (5 mg total) by mouth daily. 30 tablet 5  . fluorometholone (FML) 0.1 % ophthalmic suspension   0  . furosemide (LASIX) 20 MG tablet Take 20 mg by mouth 2 (two) times daily.    . Lutein-Zeaxanthin 6-0.24 MG CAPS Take 1 capsule by mouth daily.     . magnesium 30 MG tablet Take 30 mg by mouth. Reported on 03/26/2015    . montelukast (SINGULAIR) 10 MG tablet take 1 tablet by mouth at bedtime    . omeprazole (PRILOSEC) 20 MG capsule Take 20 mg by mouth daily.    . potassium chloride SA (K-DUR,KLOR-CON) 20 MEQ tablet Take 20 mEq by mouth 2 (two) times daily.    . prednisoLONE acetate (PRED FORTE) 1 % ophthalmic suspension Apply 1 drop to eye 4 (four) times daily. Reported on 02/12/2015    . Red Yeast Rice Extract (RED YEAST RICE PO) Take 2 capsules by mouth daily.     Marland Kitchen rOPINIRole (REQUIP) 1 MG tablet Take  1 tablet (1 mg total) by mouth at bedtime. 90 tablet 3  . sodium chloride (OCEAN) 0.65 % SOLN nasal spray Place 1 spray into both nostrils daily.    . traMADol (ULTRAM) 50 MG tablet Take 50 mg by mouth as needed.    . travoprost, benzalkonium, (TRAVATAN) 0.004 % ophthalmic solution 1 drop at bedtime.    . traZODone (DESYREL) 50 MG tablet Take 50 mg by mouth at bedtime as needed.    . triazolam (HALCION) 0.25 MG tablet Take 1 tablet by mouth at bedtime as needed.     Current Facility-Administered Medications  Medication Dose Route Frequency Provider Last Rate Last Dose  . ipratropium (ATROVENT) nebulizer solution 0.5 mg  0.5 mg Nebulization Once Adelina Mings, MD      . levalbuterol Penne Lash) nebulizer solution 1.25 mg  1.25 mg Nebulization Once  Adelina Mings, MD        Allergies  Allergen Reactions  . Molds & Smuts Other (See Comments)    Also dust mites causes sinus infections, h/a etc.  . Pollen Extract-Tree Extract Other (See Comments)    HEADACHES, TIRED , DRAINAGE FROM SINUSES    Past Medical History  Diagnosis Date  . Hyperlipemia   . Hypertension   . Benign prostatic hypertrophy   . Chronic fatigue   . Lactose intolerance   . Sleep disorder     DOES NOT SLEEP WELL  . Personal history of colonic polyps 2004    hyperplastic Dr. Collene Mares  . Gluten intolerance   . Barrett's esophagus   . Esophageal reflux   . Diverticulosis of colon (without mention of hemorrhage)   . Periaortic lymphadenopathy 02/16/2011  . Atelectasis, bilateral 02/10/11    Noted On CT Scan - Mild Dependent Atelectasis at the Lung Bases  . Hiatal hernia 02/10/11    Noted on CT Scan - Moderate Hiatal Hernia  . Renal cyst 02/10/11     Noted on CT Scan - Bilateral Renal Cysts  . S/P radiation therapy 03/15/11 - 04/09/11    Abdominal/ Pelvic Tumor, 3600 cGy/20 Fractions  . Microcytic anemia 04/20/2011   . Abdominal pain     Bloating and gas  . Gastritis   . Dysrhythmia     SKIPPED HEART BEAT SOMETIMES  . Environmental allergies   . Pain     LOWER BACK PAIN - DDD, SCOLIOSIS, BONE SPUR.  FINISHED THE 3RD EPIDURAL STEROID INJECTION 7/15 - DONE BY DR. Nelva Bush. - STILL HAVING BACK PAIN  . Osteoarthritis     hands/feet,knees, NECK, BACK  . Congestion of throat     mouth breath at night, dry mouth  . Bilateral lower extremity edema   . Hypertension   . Melanoma ([place])     lymphoma  . Lymphoma of lymph nodes in pelvis ([place]) 03/03/2011     Large Right Retroperitoneal Mass  . 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    Tommy Medal PharmD CPP [facility]

## 2015-05-01 NOTE — Assessment & Plan Note (Addendum)
His BP is quite good in the office today at 108/64.  Because of the wide variance in his home readings, I am not going to add any medication today, but instead will have him divide the benazepril/hctz 20/12.5 to 1 tablet twice daily rather than 2 tabs each am.  He will continue with home BP checks and I will see him again in another month.  His home meter read within 10 points of the office cuff today, so my hope is that his BP readings will level out some with the divided medication.

## 2015-05-06 DIAGNOSIS — E785 Hyperlipidemia, unspecified: Secondary | ICD-10-CM | POA: Diagnosis not present

## 2015-05-06 DIAGNOSIS — R609 Edema, unspecified: Secondary | ICD-10-CM | POA: Diagnosis not present

## 2015-05-06 DIAGNOSIS — N400 Enlarged prostate without lower urinary tract symptoms: Secondary | ICD-10-CM | POA: Diagnosis not present

## 2015-05-06 DIAGNOSIS — Z0000 Encounter for general adult medical examination without abnormal findings: Secondary | ICD-10-CM | POA: Diagnosis not present

## 2015-05-06 DIAGNOSIS — I10 Essential (primary) hypertension: Secondary | ICD-10-CM | POA: Diagnosis not present

## 2015-05-12 DIAGNOSIS — I10 Essential (primary) hypertension: Secondary | ICD-10-CM | POA: Diagnosis not present

## 2015-05-19 ENCOUNTER — Ambulatory Visit (INDEPENDENT_AMBULATORY_CARE_PROVIDER_SITE_OTHER): Payer: PPO | Admitting: *Deleted

## 2015-05-19 ENCOUNTER — Telehealth: Payer: Self-pay | Admitting: Cardiovascular Disease

## 2015-05-19 VITALS — BP 170/110 | HR 91

## 2015-05-19 DIAGNOSIS — N281 Cyst of kidney, acquired: Secondary | ICD-10-CM | POA: Diagnosis not present

## 2015-05-19 DIAGNOSIS — R002 Palpitations: Secondary | ICD-10-CM | POA: Diagnosis not present

## 2015-05-19 DIAGNOSIS — I499 Cardiac arrhythmia, unspecified: Secondary | ICD-10-CM | POA: Diagnosis not present

## 2015-05-19 DIAGNOSIS — I10 Essential (primary) hypertension: Secondary | ICD-10-CM | POA: Diagnosis not present

## 2015-05-19 DIAGNOSIS — N179 Acute kidney failure, unspecified: Secondary | ICD-10-CM | POA: Diagnosis not present

## 2015-05-19 NOTE — Telephone Encounter (Signed)
I saw patient for visit. Luke reviewed and signed EKG.   I set up for med mgmt/BP visit w Ryan [name] next week to discuss BP fluctuations and new meds.  Patient is to call me tomorrow to give me new info - I informed him I would try to remember to reach out to him by mid-day.  Have discussed w Ryan [name] who is aware of considerations.

## 2015-05-19 NOTE — Progress Notes (Signed)
Pt w/ reported longstanding history of skipped beats/palpitations.  As he describes, ongoing for years. No acute symptoms. Pt came in for EKG check at request of nephrology (Dr. Marylou Flesher at [facility]) As they were concerned about ability to obtain accurate BP reading w/ his pulse. He notes he never really has noted any symptoms of palpitations, but can tell his HR is irreg when checking his pulse, noting skipped beats.  Today's EKG shows Sinus w PACs, interpreted and confirmed by Kerin Ransom PA Similar to last EKG reviewed by Dr. Gwenlyn Found in Oct 2016  Pt started on new med today prescribed by Dr. Marylou Flesher - he does not recall name, will call w/ med info tomorrow.  Lurena Joiner advised no changes, but recommended if symptomatic pt could start on low dose metoprolol tartrate (12.21m BID)  Pt acknowledged - we discussed. Plan will be for pt to update med list via phone tomorrow. He will come in next week for f/u visit for med mgmt and BP in 10 days w/ KTommy Medal I advised in interim to check BP twice a day (pt notes his BP seems to run up and down sometimes, and though it is typically around 120/80, he has had elevations to 160-170/100-110 [as noted in today's VS record]).  He will keep a log and bring to visit. Advised also to call if concerns sooner.

## 2015-05-19 NOTE — Patient Instructions (Signed)
Follow up for medication management and blood pressure check w/ Erasmo Downer in 10 days.

## 2015-05-19 NOTE — Telephone Encounter (Signed)
Returned call to [employer]. Pt reported heart palps to Dr. Marylou Flesher at today's visit- they wanted to see if pt could come in for EKG check  Advised them to send pt here - I will add for nurse visit.  Pt anticipates to be seen today 3:30-4pm.

## 2015-05-19 NOTE — Telephone Encounter (Signed)
New message   [facility] is calling for pt to have a EKG done   gave fax number [phone]  Today first available if possible

## 2015-05-20 NOTE — Telephone Encounter (Signed)
Returned patient call. Updated chart -- he is aware.  He informs me kidney function tests look much improved since d/c'ing benazipril. Pt also states he will elect to f/u w Erasmo Downer on 5/9 instead of 4/27 d/t regular schedule conflict on Thursdays. Advised to call in interim if new concerns. Pt voiced acknowledgment of instructions and thanks.

## 2015-05-20 NOTE — Telephone Encounter (Signed)
Follow up    Patient calling     Pt c/o medication issue:  1. Name of Medication: new medication Carvedilol   2. How are you currently taking this medication (dosage and times per day)?  6.25 mg  Twice a day  - prescribe by Dr. Joelyn Oms - [facility]   3. Are you having a reaction (difficulty breathing--STAT)? No   4. What is your medication issue?  Patient wanted the MD to know

## 2015-05-22 ENCOUNTER — Telehealth: Payer: Self-pay | Admitting: Cardiovascular Disease

## 2015-05-22 NOTE — Telephone Encounter (Signed)
Have him start amlodipine 5 mg once daily (he took 10 mg of this last year, but at some point ran out and it was never refilled).  Continue to monitor BP and let him know that it can take about 5-7 days to see improvement once he starts the amlodipine

## 2015-05-22 NOTE — Telephone Encounter (Signed)
Follow up   Pt is calling for NATHAN to return his call  i asked for reason  Pt c/o BP issue: STAT if pt c/o blurred vision, one-sided weakness or slurred speech  1. What are your last 5 BP readings? 171/96  2. Are you having any other symptoms (ex. Dizziness, headache, blurred vision, passed out)? no 3. What is your BP issue? High

## 2015-05-22 NOTE — Telephone Encounter (Signed)
Recently started on carvedilol - had been off benazipril Notes issues w BP running high & has been checking twice daily to see if swings in values.  Tue  144/101 am  121/97 pm    Wed 146/   am  191/111 pm  Called 911 due to concern for PM value - EMS came around midnight. Dropped to 160/90 on its own - did not want to go to hospital.  Today 187/91 am 171/96 pm  Pt notes anxiety, slight "issues", but denies headache, dizziness, etc. Aware I will follow up w/ recommendations. Discussed use of on-call provider line. Discussed possibility of dosing increases or short acting meds for BP spikes, but he is aware not to do anything different until further instruction. Will route to [place] for advice.

## 2015-05-23 NOTE — Telephone Encounter (Signed)
Spoke with patient, he has already been in contact today with Dr. Joelyn Oms at [facility] and was started on carvedilol 6.25 mg bid.  Patient notes he will follow up with his blood pressure concerns with Dr. Joelyn Oms for now.

## 2015-05-23 NOTE — Telephone Encounter (Signed)
Pt informs me he was instructed by his kidney doctor to "absolutely not" take amlodipine d/t kidney damage. He requests adjustment to current meds or rx for different med.

## 2015-05-29 ENCOUNTER — Encounter: Payer: PPO | Admitting: Pharmacist Clinician (PhC)/ Clinical Pharmacy Specialist

## 2015-06-02 ENCOUNTER — Other Ambulatory Visit: Payer: Self-pay | Admitting: *Deleted

## 2015-06-02 DIAGNOSIS — I10 Essential (primary) hypertension: Secondary | ICD-10-CM | POA: Diagnosis not present

## 2015-06-02 DIAGNOSIS — R002 Palpitations: Secondary | ICD-10-CM

## 2015-06-02 DIAGNOSIS — N179 Acute kidney failure, unspecified: Secondary | ICD-10-CM | POA: Diagnosis not present

## 2015-06-02 NOTE — Addendum Note (Signed)
Addended by: Theodore Demark on: 06/02/2015 05:31 PM   Modules accepted: Orders

## 2015-06-10 ENCOUNTER — Ambulatory Visit: Payer: PPO | Admitting: Pharmacist Clinician (PhC)/ Clinical Pharmacy Specialist

## 2015-06-13 ENCOUNTER — Other Ambulatory Visit: Payer: Self-pay | Admitting: *Deleted

## 2015-06-13 DIAGNOSIS — C8599 Non-Hodgkin lymphoma, unspecified, extranodal and solid organ sites: Secondary | ICD-10-CM

## 2015-06-16 ENCOUNTER — Telehealth: Payer: Self-pay | Admitting: Oncology

## 2015-06-16 ENCOUNTER — Other Ambulatory Visit: Payer: PPO

## 2015-06-16 ENCOUNTER — Telehealth: Payer: Self-pay | Admitting: *Deleted

## 2015-06-16 ENCOUNTER — Ambulatory Visit: Payer: PPO | Admitting: Oncology

## 2015-06-16 NOTE — Telephone Encounter (Signed)
Received voicemail that patient would like to cancel appointment for today (06/16/15) due to virus. RN informed MD [place] POF sent to scheduler to reschedule.

## 2015-06-16 NOTE — Telephone Encounter (Signed)
s.w.l pt and r/s appt....pt ok and aware of new d.t

## 2015-06-24 ENCOUNTER — Other Ambulatory Visit: Payer: Self-pay | Admitting: Allergy and Immunology

## 2015-06-26 ENCOUNTER — Telehealth: Payer: Self-pay | Admitting: Oncology

## 2015-06-26 ENCOUNTER — Other Ambulatory Visit (HOSPITAL_BASED_OUTPATIENT_CLINIC_OR_DEPARTMENT_OTHER): Payer: PPO

## 2015-06-26 ENCOUNTER — Ambulatory Visit (HOSPITAL_BASED_OUTPATIENT_CLINIC_OR_DEPARTMENT_OTHER): Payer: PPO | Admitting: Oncology

## 2015-06-26 VITALS — BP 124/85 | HR 62 | Temp 98.4°F | Resp 18 | Ht 67.0 in | Wt 176.6 lb

## 2015-06-26 DIAGNOSIS — M549 Dorsalgia, unspecified: Secondary | ICD-10-CM | POA: Diagnosis not present

## 2015-06-26 DIAGNOSIS — R109 Unspecified abdominal pain: Secondary | ICD-10-CM | POA: Diagnosis not present

## 2015-06-26 DIAGNOSIS — Z8572 Personal history of non-Hodgkin lymphomas: Secondary | ICD-10-CM

## 2015-06-26 DIAGNOSIS — C8599 Non-Hodgkin lymphoma, unspecified, extranodal and solid organ sites: Secondary | ICD-10-CM

## 2015-06-26 LAB — CBC WITH DIFFERENTIAL/PLATELET
BASO%: 0.5 % (ref 0.0–2.0)
BASOS ABS: 0 10*3/uL (ref 0.0–0.1)
EOS%: 1.1 % (ref 0.0–7.0)
Eosinophils Absolute: 0.1 10*3/uL (ref 0.0–0.5)
HEMATOCRIT: 42.9 % (ref 38.4–49.9)
HGB: 14.2 g/dL (ref 13.0–17.1)
LYMPH%: 22.8 % (ref 14.0–49.0)
MCH: 30.4 pg (ref 27.2–33.4)
MCHC: 33.1 g/dL (ref 32.0–36.0)
MCV: 91.8 fL (ref 79.3–98.0)
MONO#: 0.6 10*3/uL (ref 0.1–0.9)
MONO%: 9.3 % (ref 0.0–14.0)
NEUT#: 3.9 10*3/uL (ref 1.5–6.5)
NEUT%: 66.3 % (ref 39.0–75.0)
Platelets: 237 10*3/uL (ref 140–400)
RBC: 4.67 10*6/uL (ref 4.20–5.82)
RDW: 15 % — ABNORMAL HIGH (ref 11.0–14.6)
WBC: 5.9 10*3/uL (ref 4.0–10.3)
lymph#: 1.4 10*3/uL (ref 0.9–3.3)

## 2015-06-26 LAB — COMPREHENSIVE METABOLIC PANEL
ALT: 15 U/L (ref 0–55)
AST: 17 U/L (ref 5–34)
Albumin: 3.5 g/dL (ref 3.5–5.0)
Alkaline Phosphatase: 87 U/L (ref 40–150)
Anion Gap: 9 mEq/L (ref 3–11)
BUN: 14 mg/dL (ref 7.0–26.0)
CALCIUM: 8.9 mg/dL (ref 8.4–10.4)
CHLORIDE: 102 meq/L (ref 98–109)
CO2: 32 meq/L — AB (ref 22–29)
CREATININE: 1 mg/dL (ref 0.7–1.3)
EGFR: 68 mL/min/{1.73_m2} — ABNORMAL LOW (ref >90)
Glucose: 102 mg/dl (ref 70–140)
POTASSIUM: 3.8 meq/L (ref 3.5–5.1)
Sodium: 143 mEq/L (ref 136–145)
Total Bilirubin: 0.5 mg/dL (ref 0.20–1.20)
Total Protein: 6.2 g/dL — ABNORMAL LOW (ref 6.4–8.3)

## 2015-06-26 LAB — LACTATE DEHYDROGENASE: LDH: 177 U/L (ref 125–245)

## 2015-06-26 NOTE — Telephone Encounter (Signed)
per pfo to sch pt appt-gave pt copy of avs

## 2015-06-26 NOTE — Progress Notes (Signed)
  [place] OFFICE PROGRESS NOTE   Diagnosis: Non-Hodgkin's lymphoma  INTERVAL HISTORY:   [name] returns as scheduled. He has chronic back pain. He receives epidural steroid injections approximate every 3 months. No fever, no palpable lymph nodes. He reports losing weight with adjustment of his fluid regimen. He was recently evaluated by cardiology.  Objective:  Vital signs in last 24 hours:  Blood pressure 124/85, pulse 62, temperature 98.4 F (36.9 C), temperature source Oral, resp. rate 18, height _0  (1.702 m), weight 176 lb 9.6 oz (80.105 kg), SpO2 95 %.    HEENT: Neck without mass Lymphatics: No cervical, supra-clavicular, axillary, or inguinal nodes Resp: Inspiratory rales at the right greater than left posterior base, no respiratory distress Cardio: Irregular GI: No mass, no hepatosplenomegaly Vascular: No leg edema   Lab Results:  Lab Results  Component Value Date   WBC 5.9 06/26/2015   HGB 14.2 06/26/2015   HCT 42.9 06/26/2015   MCV 91.8 06/26/2015   PLT 237 06/26/2015   NEUTROABS 3.9 06/26/2015     Medications: I have reviewed the patient's current medications.  Assessment/Plan: 1. Non-Hodgkin's,, low-grade B cell diagnosed in January 2013, disease localized to the pelvis, status post involved field radiation to 3600cGy completed in March of 2013, no evidence of recurrent lymphoma on a CT 04/17/2013  No evidence of recurrent lymphoma on CTs of the chest, abdomen, and pelvis 12/24/2014  2. Intermittent abdominal pain-etiology unclear  3. Chronic back pain-followed by Dr.Elsner status post epidural steroid injections and decompression surgery 12/31/2013  4. Anemia-history of iron deficiency, normal ferritin in August 2015 and November 2015    Disposition:  [name] remains in clinical remission from non-Hodgkin's lymphoma. He will return for an office visit in 8 months.  Betsy Coder, MD  06/26/2015  9:56 AM

## 2015-06-27 ENCOUNTER — Other Ambulatory Visit: Payer: Self-pay

## 2015-06-27 ENCOUNTER — Other Ambulatory Visit: Payer: Self-pay | Admitting: Allergy and Immunology

## 2015-06-27 NOTE — Telephone Encounter (Signed)
CANCELED SCRIPT AS PATIENT SHOULD BE ON SYMBICORT ACCORDING TO LAST OFFICE VISIT. LEFT MESSAGE FOR PATIENT TO CALL OFFICE. DR. BOBBITT DO YOU WANT PATIENT TO BE ON BOTH QVAR AND SYMBICORT? PLEASE ADVISE.

## 2015-06-27 NOTE — Telephone Encounter (Signed)
Pt last seen on 03/26/15 by Dr. Verlin Fester. He was give a sample of qvar. He would like a prescription sent to [facility] on [id number].  Please Advise  Thanks

## 2015-06-27 NOTE — Telephone Encounter (Signed)
SCRIPT SENT

## 2015-06-30 ENCOUNTER — Other Ambulatory Visit: Payer: Self-pay | Admitting: Allergy and Immunology

## 2015-07-01 ENCOUNTER — Other Ambulatory Visit: Payer: Self-pay | Admitting: *Deleted

## 2015-07-01 NOTE — Telephone Encounter (Signed)
Yes, he is to take the Symbicort as his baseline maintenance medication.  I added Qvar during his last office visit, in addition to the Symbicort, to attain better control.  Undergoing [place] basis, the Qvar will probably be added to the Symbicort during times of upper respiratory tract infections and asthma flares.  Thanks.

## 2015-07-03 DIAGNOSIS — H401131 Primary open-angle glaucoma, bilateral, mild stage: Secondary | ICD-10-CM | POA: Diagnosis not present

## 2015-07-03 MED ORDER — MOMETASONE FUROATE 200 MCG/ACT IN AERO: 2.0000 | INHALATION_SPRAY | Freq: Two times a day (BID) | RESPIRATORY_TRACT | Status: DC

## 2015-07-03 NOTE — Telephone Encounter (Signed)
Informed patient on which inhaler to use daily and that we were switching his Qvar to Asmanex 220 HFA to use during his flares as his insurance will no longer cover Qvar. Patient understood and agreed with plane.

## 2015-07-07 ENCOUNTER — Other Ambulatory Visit: Payer: Self-pay | Admitting: Allergy

## 2015-07-07 MED ORDER — BECLOMETHASONE DIPROPIONATE 80 MCG/ACT IN AERS: INHALATION_SPRAY | RESPIRATORY_TRACT | Status: DC

## 2015-07-08 ENCOUNTER — Other Ambulatory Visit: Payer: Self-pay

## 2015-07-11 DIAGNOSIS — M5416 Radiculopathy, lumbar region: Secondary | ICD-10-CM | POA: Diagnosis not present

## 2015-07-11 DIAGNOSIS — M4186 Other forms of scoliosis, lumbar region: Secondary | ICD-10-CM | POA: Diagnosis not present

## 2015-07-15 ENCOUNTER — Telehealth: Payer: Self-pay | Admitting: Allergy and Immunology

## 2015-07-15 NOTE — Telephone Encounter (Signed)
Start Flovent 220 mcg, 2 inhalation via spacer twice a day. Thanks.

## 2015-07-15 NOTE — Telephone Encounter (Signed)
FLOVENT HFA/DISKUS OR ARNUITY BOTH TIER 3

## 2015-07-15 NOTE — Telephone Encounter (Signed)
Asmanex nor QVar are covered by his insurance and are very expensive to buy out of pocket. Is there something different that could be prescribed that would be covered by his insurance. The QVar works best but just isn't covered.  Uses [facility] on [address]

## 2015-07-15 NOTE — Telephone Encounter (Signed)
Please provide me with options regarding his insurance coverage.

## 2015-07-16 ENCOUNTER — Other Ambulatory Visit: Payer: Self-pay

## 2015-07-16 MED ORDER — FLUTICASONE PROPIONATE HFA 220 MCG/ACT IN AERO: 2.0000 | INHALATION_SPRAY | Freq: Two times a day (BID) | RESPIRATORY_TRACT | Status: DC

## 2015-07-16 NOTE — Telephone Encounter (Signed)
Sent in rx and informed pt

## 2015-08-12 ENCOUNTER — Telehealth: Payer: Self-pay | Admitting: *Deleted

## 2015-08-12 DIAGNOSIS — I4891 Unspecified atrial fibrillation: Secondary | ICD-10-CM | POA: Diagnosis not present

## 2015-08-12 DIAGNOSIS — R0609 Other forms of dyspnea: Secondary | ICD-10-CM | POA: Diagnosis not present

## 2015-08-12 NOTE — Telephone Encounter (Signed)
Dr Stanford Breed spoke with the pa from [facility], pt with new atrial fib by their report. Advised to start pt on eliquis 5 mg bid and stop aspirin. Pt will be seen in the atrial fib clinic 08-14-15 @ 2:30pm.

## 2015-08-14 ENCOUNTER — Ambulatory Visit (HOSPITAL_COMMUNITY): Payer: PPO | Admitting: Nurse Practitioner

## 2015-08-14 NOTE — Telephone Encounter (Signed)
Spoke with pt, confirmed appt for tomorrow at the a fib clinic.

## 2015-08-15 ENCOUNTER — Ambulatory Visit (HOSPITAL_COMMUNITY)
Admission: RE | Admit: 2015-08-15 | Discharge: 2015-08-15 | Disposition: A | Discharge status: Home / Self Care | Payer: PPO | Source: Ambulatory Visit | Attending: Nurse Practitioner | Admitting: Nurse Practitioner

## 2015-08-15 ENCOUNTER — Encounter (HOSPITAL_COMMUNITY): Payer: Self-pay | Admitting: Nurse Practitioner

## 2015-08-15 VITALS — BP 148/88 | HR 85 | Ht 67.0 in | Wt 183.4 lb

## 2015-08-15 DIAGNOSIS — R0602 Shortness of breath: Secondary | ICD-10-CM

## 2015-08-15 DIAGNOSIS — K219 Gastro-esophageal reflux disease without esophagitis: Secondary | ICD-10-CM | POA: Diagnosis not present

## 2015-08-15 DIAGNOSIS — E785 Hyperlipidemia, unspecified: Secondary | ICD-10-CM | POA: Diagnosis not present

## 2015-08-15 DIAGNOSIS — Z7951 Long term (current) use of inhaled steroids: Secondary | ICD-10-CM | POA: Diagnosis not present

## 2015-08-15 DIAGNOSIS — R931 Abnormal findings on diagnostic imaging of heart and coronary circulation: Secondary | ICD-10-CM

## 2015-08-15 DIAGNOSIS — R600 Localized edema: Secondary | ICD-10-CM | POA: Diagnosis not present

## 2015-08-15 DIAGNOSIS — Z85820 Personal history of malignant melanoma of skin: Secondary | ICD-10-CM | POA: Diagnosis not present

## 2015-08-15 DIAGNOSIS — Z8249 Family history of ischemic heart disease and other diseases of the circulatory system: Secondary | ICD-10-CM | POA: Diagnosis not present

## 2015-08-15 DIAGNOSIS — Z79899 Other long term (current) drug therapy: Secondary | ICD-10-CM | POA: Insufficient documentation

## 2015-08-15 DIAGNOSIS — I10 Essential (primary) hypertension: Secondary | ICD-10-CM | POA: Insufficient documentation

## 2015-08-15 DIAGNOSIS — Z87891 Personal history of nicotine dependence: Secondary | ICD-10-CM | POA: Insufficient documentation

## 2015-08-15 DIAGNOSIS — R002 Palpitations: Secondary | ICD-10-CM

## 2015-08-15 DIAGNOSIS — Z923 Personal history of irradiation: Secondary | ICD-10-CM | POA: Insufficient documentation

## 2015-08-15 DIAGNOSIS — R42 Dizziness and giddiness: Secondary | ICD-10-CM

## 2015-08-15 DIAGNOSIS — I491 Atrial premature depolarization: Secondary | ICD-10-CM | POA: Diagnosis not present

## 2015-08-15 DIAGNOSIS — Z8572 Personal history of non-Hodgkin lymphomas: Secondary | ICD-10-CM | POA: Insufficient documentation

## 2015-08-15 LAB — CBC
HCT: 46.4 % (ref 39.0–52.0)
HEMOGLOBIN: 14.5 g/dL (ref 13.0–17.0)
MCH: 31.2 pg (ref 26.0–34.0)
MCHC: 31.3 g/dL (ref 30.0–36.0)
MCV: 99.8 fL (ref 78.0–100.0)
Platelets: 274 10*3/uL (ref 150–400)
RBC: 4.65 MIL/uL (ref 4.22–5.81)
RDW: 14 % (ref 11.5–15.5)
WBC: 7.9 10*3/uL (ref 4.0–10.5)

## 2015-08-15 LAB — BASIC METABOLIC PANEL
Anion gap: 6 (ref 5–15)
BUN: 22 mg/dL — AB (ref 6–20)
CALCIUM: 8.8 mg/dL — AB (ref 8.9–10.3)
CHLORIDE: 102 mmol/L (ref 101–111)
CO2: 32 mmol/L (ref 22–32)
CREATININE: 1.01 mg/dL (ref 0.61–1.24)
GFR calc non Af Amer: >60 mL/min (ref >60)
Glucose, Bld: 112 mg/dL — ABNORMAL HIGH (ref 65–99)
Potassium: 4 mmol/L (ref 3.5–5.1)
SODIUM: 140 mmol/L (ref 135–145)

## 2015-08-15 MED ORDER — APIXABAN 5 MG PO TABS: 5.0000 mg | ORAL_TABLET | Freq: Two times a day (BID) | ORAL | Status: DC

## 2015-08-15 NOTE — Patient Instructions (Addendum)
Your physician has recommended that you wear a holter monitor. Holter monitors are medical devices that record the heart's electrical activity. Doctors most often use these monitors to diagnose arrhythmias. Arrhythmias are problems with the speed or rhythm of the heartbeat. The monitor is a small, portable device. You can wear one while you do your normal daily activities. This is usually used to diagnose what is causing palpitations/syncope (passing out).  This has been scheduled for you at the [facility] office for Tuesday 07/18 at 9:30; please be there by 9:15  Continue the eliquis until we get the monitor results.    Your physician has requested that you have an echocardiogram. Echocardiography is a painless test that uses sound waves to create images of your heart. It provides your doctor with information about the size and shape of your heart and how well your heart's chambers and valves are working. This procedure takes approximately one hour. There are no restrictions for this procedure.  This has been scheduled for Monday at 3:00 pm.  You will check in at the same desk and park in the same garage as today

## 2015-08-18 ENCOUNTER — Ambulatory Visit (HOSPITAL_COMMUNITY)
Admission: RE | Admit: 2015-08-18 | Discharge: 2015-08-18 | Disposition: A | Discharge status: Home / Self Care | Payer: PPO | Source: Ambulatory Visit | Attending: Nurse Practitioner | Admitting: Nurse Practitioner

## 2015-08-18 ENCOUNTER — Ambulatory Visit (HOSPITAL_COMMUNITY): Admission: RE | Admit: 2015-08-18 | Payer: PPO | Source: Ambulatory Visit

## 2015-08-18 ENCOUNTER — Other Ambulatory Visit (HOSPITAL_COMMUNITY): Payer: Self-pay | Admitting: Nurse Practitioner

## 2015-08-18 ENCOUNTER — Encounter (HOSPITAL_COMMUNITY): Payer: Self-pay | Admitting: Nurse Practitioner

## 2015-08-18 DIAGNOSIS — I340 Nonrheumatic mitral (valve) insufficiency: Secondary | ICD-10-CM | POA: Insufficient documentation

## 2015-08-18 DIAGNOSIS — R002 Palpitations: Secondary | ICD-10-CM

## 2015-08-18 DIAGNOSIS — I351 Nonrheumatic aortic (valve) insufficiency: Secondary | ICD-10-CM | POA: Insufficient documentation

## 2015-08-18 DIAGNOSIS — R0602 Shortness of breath: Secondary | ICD-10-CM | POA: Insufficient documentation

## 2015-08-18 DIAGNOSIS — I119 Hypertensive heart disease without heart failure: Secondary | ICD-10-CM | POA: Diagnosis not present

## 2015-08-18 DIAGNOSIS — R42 Dizziness and giddiness: Secondary | ICD-10-CM

## 2015-08-18 DIAGNOSIS — R931 Abnormal findings on diagnostic imaging of heart and coronary circulation: Secondary | ICD-10-CM | POA: Diagnosis not present

## 2015-08-18 DIAGNOSIS — R9431 Abnormal electrocardiogram [ECG] [EKG]: Secondary | ICD-10-CM | POA: Diagnosis not present

## 2015-08-18 LAB — ECHOCARDIOGRAM COMPLETE
Ao-asc: 39 cm
CHL CUP REG VEL DIAS: 175 cm/s
CHL CUP STROKE VOLUME: 43 mL
CHL CUP TV REG PEAK VELOCITY: 371 cm/s
E decel time: 208 msec
EERAT: 8.8
FS: 32 % (ref 28–44)
IV/PV OW: 0.78
LA ID, A-P, ES: 32 mm
LA diam index: 1.6 cm/m2
LAVOL: 76.4 mL
LAVOLA4C: 74.4 mL
LAVOLIN: 38.1 mL/m2
LDCA: 3.8 cm2
LEFT ATRIUM END SYS DIAM: 32 mm
LV E/e'average: 8.8
LV SIMPSON'S DISK: 60
LV dias vol index: 35 mL/m2
LV dias vol: 71 mL (ref 62–150)
LV sys vol index: 14 mL/m2
LVEEMED: 8.8
LVELAT: 8.05 cm/s
LVOT diameter: 22 mm
LVSYSVOL: 28 mL (ref 21–61)
Lateral S' vel: 11.5 cm/s
MV Dec: 208
MV Peak grad: 2 mmHg
MV pk A vel: 83.9 m/s
MVPKEVEL: 70.8 m/s
P 1/2 time: 475 ms
PV Reg grad dias: 12 mmHg
PW: 11.8 mm — AB (ref 0.6–1.1)
RV sys press: 58 mmHg
TAPSE: 18.5 mm
TDI e' lateral: 8.05
TDI e' medial: 4.9
TR max vel: 371 cm/s

## 2015-08-18 NOTE — Progress Notes (Addendum)
Patient ID: Ryan [name], male   DOB: 1931-09-03, 82 y.o.   MRN: [id number]     Primary Care Physician: Chesley Noon, MD Referring Physician: Dr. Gerald Stabs is a 82 y.o. male with a h/o recently being seen by PCP and c/o palpitations, sob, pedal edema for several months. His EKG per PCP was irregular and a call was made to Dr. Stanford Breed that pt had new onset afib and he was referred to afib clinic. He reports that he feels irregular beats but shortness of breath is more chronic and does not correlate with palpitations. Dyspnea has gotten worse over the last year. He does have DDD, and it is getting harder for him to move around  and is becoming more sedentary.Walks with a cane, no falls. He is on DOAC for chadsvasc of 3.Wife denies current snoring . He sleeps poorly though. Drinks scotch at bedtime and then will wake up and take xanax 2 more times during the night and will wash it down with scotch. He then will drink a cup of strong tea in the am and then later a cup of coffee. Very little water intake.   Ekg obtained from PCP office and even though automatic interpretation reads Afib, there are definite P waves, confirmed with cardiologist that it is SR with frequent PAC's. He has been on Eliquis 5 mg bid x 2 days. Currently no rate control drug on board. EKG today shows SR with PAC's.  Today, he denies symptoms of  chest pain, orthopnea, PND,  dizziness, presyncope, syncope, or neurologic sequela. Positive for papls, exertional dyspnea and LEE.The patient is tolerating medications without difficulties and is otherwise without complaint today.   Past Medical History  Diagnosis Date  . Hyperlipemia   . Hypertension   . Benign prostatic hypertrophy   . Chronic fatigue   . Lactose intolerance   . Sleep disorder     DOES NOT SLEEP WELL  . Personal history of colonic polyps 2004    hyperplastic Dr. Collene Mares  . Gluten intolerance   . Barrett's esophagus   . Esophageal reflux    . Diverticulosis of colon (without mention of hemorrhage)   . Periaortic lymphadenopathy 02/16/2011  . Atelectasis, bilateral 02/10/11    Noted On CT Scan - Mild Dependent Atelectasis at the Lung Bases  . Hiatal hernia 02/10/11    Noted on CT Scan - Moderate Hiatal Hernia  . Renal cyst 02/10/11     Noted on CT Scan - Bilateral Renal Cysts  . S/P radiation therapy 03/15/11 - 04/09/11    Abdominal/ Pelvic Tumor, 3600 cGy/20 Fractions  . Microcytic anemia 04/20/2011   . Abdominal pain     Bloating and gas  . Gastritis   . Dysrhythmia     SKIPPED HEART BEAT SOMETIMES  . Environmental allergies   . Pain     LOWER BACK PAIN - DDD, SCOLIOSIS, BONE SPUR.  FINISHED THE 3RD EPIDURAL STEROID INJECTION 7/15 - DONE BY DR. Nelva Bush. - STILL HAVING BACK PAIN  . Osteoarthritis     hands/feet,knees, NECK, BACK  . Congestion of throat     mouth breath at night, dry mouth  . Bilateral lower extremity edema   . Hypertension   . Melanoma ([place])     lymphoma  . Lymphoma of lymph nodes in pelvis ([place]) 03/03/2011     Large Right Retroperitoneal Mass  . 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 Rotator cuff repair      right  . Cataract extraction, bilateral    . Tonsillectomy    . Arthroscopic repair acl      right  . Bone marrow aspiration  02/25/11    Bone Marrow, Aspirate, Clot, and Bilateral Bx, Right PIC  . Hernia repair      LEFT INGUINAL   . Ventral hernia repair N/A 08/23/2012    Procedure: HERNIA REPAIR VENTRAL ADULT;  Surgeon: Madilyn Hook, DO;  Location: [facility];  Service: General;  Laterality: N/A;  . Insertion of mesh N/A 08/23/2012    Procedure: INSERTION OF MESH;  Surgeon: Madilyn Hook, DO;  Location: [facility];  Service: General;  Laterality: N/A;  . Tonsillectomy    . Eye surgery    . Lumbar laminectomy/decompression microdiscectomy Right 12/31/2013    Procedure: RIGHT L4-5 L5-S1  LAMINECTOMY;  Surgeon: Kristeen Miss, MD;  Location: [facility];  Service: Neurosurgery;  Laterality: Right;  RIGHT L4-5 L5-S1 LAMINECTOMY    Current Outpatient Prescriptions  Medication Sig Dispense Refill  . ALPRAZolam (XANAX) 0.25 MG tablet Take 0.125-0.25 mg by mouth 2 (two) times daily as needed for anxiety.     Marland Kitchen apixaban (ELIQUIS) 5 MG TABS tablet Take 1 tablet (5 mg total) by mouth 2 (two) times daily. 60 tablet 0  . budesonide-formoterol (SYMBICORT) 160-4.5 MCG/ACT inhaler INHALE TWO PUFFS TWICE DAILY TO PREVENT COUGH OR WHEEZE 10.2 Inhaler 5  . finasteride (PROSCAR) 5 MG tablet Take 1 tablet (5 mg total) by mouth daily. 30 tablet 5  . fluticasone (FLOVENT HFA) 220 MCG/ACT inhaler Inhale 2 puffs into the lungs 2 (two) times daily. 12 g 5  . furosemide (LASIX) 20 MG tablet Take 20 mg by mouth 2 (two) times daily.    . montelukast (SINGULAIR) 10 MG tablet take 1 tablet by mouth at bedtime    . omeprazole (PRILOSEC) 20 MG capsule Take 20 mg by mouth daily.    . potassium chloride SA (K-DUR,KLOR-CON) 20 MEQ tablet Take 20 mEq by mouth 2 (two) times daily.    Marland Kitchen rOPINIRole (REQUIP) 1 MG tablet Take 1 tablet (1 mg total) by mouth at bedtime. 90 tablet 3  . sodium chloride (OCEAN) 0.65 % SOLN nasal spray Place 1 spray into both nostrils daily.    . traMADol (ULTRAM) 50 MG tablet Take 50 mg by mouth as needed.    . travoprost, benzalkonium, (TRAVATAN) 0.004 % ophthalmic solution 1 drop at bedtime.    . traZODone (DESYREL) 50 MG tablet Take 50 mg by mouth at bedtime as needed.    . triazolam (HALCION) 0.25 MG tablet Take 1 tablet by mouth at bedtime as needed.    . beclomethasone (QVAR) 80 MCG/ACT inhaler inhale 2 puffs by mouth every 12 hours (Patient not taking: Reported on 08/15/2015) 8.7 Inhaler 2  . CHOLECALCIFEROL PO Take 800 Units by mouth daily. Reported on 08/15/2015    . polyethylene glycol (MIRALAX / GLYCOLAX) packet Take 17 g by mouth daily. Reported on 08/15/2015     Current  Facility-Administered Medications  Medication Dose Route Frequency Provider Last Rate Last Dose  . ipratropium (ATROVENT) nebulizer solution 0.5 mg  0.5 mg Nebulization Once Adelina Mings, MD      . levalbuterol Penne Lash) nebulizer solution 1.25 mg  1.25 mg Nebulization Once Adelina Mings, MD        Allergies  Allergen Reactions  . Molds &  Smuts Other (See Comments)    Also dust mites causes sinus infections, h/a etc.  . Pollen Extract-Tree Extract Other (See Comments)    HEADACHES, TIRED , DRAINAGE FROM SINUSES    Social History   Social History  . Marital Status: Married    Spouse Name: N/A  . Number of Children: 2  . Years of Education: N/A   Occupational History  . 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 Smoking status: Former Smoker -- 1.00 packs/day for 35 years    Types: Pipe, Cigarettes    Quit date: 02/23/1992  . Smokeless tobacco: Never Used     Comment: quit 20 years ago  . Alcohol Use: Yes     Comment: 2 drinks daily scotch  AND WINE WITH SUPPER  . Drug Use: No  . Sexual Activity: Not on file   Other Topics Concern  . Not on file   Social History Narrative   Married - second marriage   He has two children   Retired Horticulturist, commercial Professor   Currently teaches pottery making, 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[facility]   Former Smoker quit 12 years ago- smoked for 35 years   Alcohol use- yes    Family History  Problem Relation Age of Onset  . Heart disease Father     MI 60  . Urticaria Father   . Prostate cancer Brother   . Prostate cancer Paternal Uncle   . Prostate cancer Paternal Uncle   . Prostate cancer Paternal Uncle   . Colon cancer Neg Hx   . Allergic rhinitis Neg Hx   . Asthma Neg Hx   . Eczema Neg Hx   . Hyperlipidemia    . Stroke    . Hypertension    . ADD / ADHD      ROS- All systems are reviewed and negative except as per the HPI above  Physical Exam: Filed Vitals:   08/15/15 1116  BP:  148/88  Pulse: 85  Height: _0  (1.702 m)  Weight: 183 lb 6.4 oz (83.19 kg)    GEN- The patient is well appearing, alert and oriented x 3 today.   Head- normocephalic, atraumatic Eyes-  Sclera clear, conjunctiva pink Ears- hearing intact Oropharynx- clear Neck- supple, no JVP Lymph- no cervical lymphadenopathy Lungs- Clear to ausculation bilaterally, normal work of breathing Heart-  Slightly irregular(pc's) rate and rhythm, no murmurs, rubs or gallops, PMI not laterally displaced GI- soft, NT, ND, + BS Extremities- no clubbing, cyanosis, or 2+ pedal edema MS- no significant deformity or atrophy Skin- no rash or lesion Psych- euthymic mood, full affect Neuro- strength and sensation are intact  EKG- SR with PAC's, v rate 85 bpm, IRBBB, Pr itn 182 ms  EKG from PCP's office obtained and reviewed EKG today shows SR with PAC's,Pr itn 182 ms, QRS int 119 ms, QTc 433m Echo-Study Conclusions  - Left ventricle: The cavity size was normal. There was severe focal basal hypertrophy of the septum - consistent with hypertrophic cardiomyopathy. No LVOT obstruction is noted. Systolic function was normal. The estimated ejection fraction was in the range of 60% to 65%. Wall motion was normal; there were no regional wall motion abnormalities. Doppler parameters are consistent with abnormal left ventricular relaxation (grade 1 diastolic dysfunction). - Mitral valve: Mildly thickened leaflets. No SAM. There was trivial regurgitation. - Left atrium: The atrium was at the upper limits of normal in size. - Tricuspid  valve: There was moderate regurgitation. - Pulmonary arteries: PA peak pressure: 43 mm Hg (S). - Inferior vena cava: The vessel was normal in size. The respirophasic diameter changes were in the normal range (= 50%), consistent with normal central venous pressure.  Impressions:  - Compared to the prior echo in 12/2012, the proximal septum now measures 1.6  cm. There is no LVOT obstruction, EF is 60-65%, there is moderate TR with RVSP of 43 mmHg.   Assessment and Plan:  1. Palpitations EKG with PAC's, no documented afib, but frequent PAC's often precursor of afib Will place on holter monitor x 48 hours to confirm no presence of afib, and if so, will stop DOAC.    2. Lifestyle issues Pt does drink alcohol daily and is concerning that he takes several drinks of scotch during the night with xanax. He was told to diminish alcohol use.  Very sedentary due to DDD.   3. Chronic worsening shortness of breath and pedal edema Echo pending  Recheck 2 weeks for monitor results. If no afib documented , will stop DOAC.   Addendum: as pt was leaving and reached the lobby, he described not feeling well due to dizziness. He was brought back to the clinic and stat cbc, bmet was obtained and repeat EKG, which showed SR at 66 bpm, IRBBB, No PC's. He was given water to drink and orthostatic BP was obtained. He was not orthostatic. Bmet showed slight increase in BUN and he was advised to drink more water at home. After staying in the clinic another 30 mins, he got up and walked around and the dizziness was resolved and he was able to leave clinic. CBC unremarkable. Echo pending on Monday.  Ryan [name] [facility] [address] [place], [place] [address] [phone]   Addendum: 7/27- 48 hour monitor returned with PAC's, PVC's brief PAT.  No  Afib/flutter documented, so without documented afib will stop DOAC. Echo showed normal EF with moderate pulmonary hypertension, no significant valvular abnormalities, and may be responsible for pt's shortness of breath and pedal edema. Wife states that he is still dizzy at times when he stands up. He has an appointment with Kerin Ransom, [place], 8/31 and I have asked the wife to call and move up appointment to have these symptoms addressed sooner. Since I reviewed the monitor and echo over the  phone, appointment in the afib clinic will be cancelled for 8/1.  Ryan Baseman Ryan [name], [facility] [address] [place], [place] [address] [phone]

## 2015-08-18 NOTE — Progress Notes (Signed)
Echocardiogram 2D Echocardiogram has been performed.  Aggie Cosier 08/18/2015, 4:12 PM

## 2015-08-19 ENCOUNTER — Ambulatory Visit: Payer: PPO | Admitting: Allergy and Immunology

## 2015-08-19 ENCOUNTER — Ambulatory Visit (INDEPENDENT_AMBULATORY_CARE_PROVIDER_SITE_OTHER): Payer: PPO | Admitting: Allergy and Immunology

## 2015-08-19 ENCOUNTER — Encounter: Payer: Self-pay | Admitting: Allergy and Immunology

## 2015-08-19 ENCOUNTER — Ambulatory Visit (INDEPENDENT_AMBULATORY_CARE_PROVIDER_SITE_OTHER): Payer: PPO

## 2015-08-19 VITALS — BP 140/70 | HR 68 | Temp 98.1°F | Resp 16

## 2015-08-19 DIAGNOSIS — R002 Palpitations: Secondary | ICD-10-CM

## 2015-08-19 DIAGNOSIS — R42 Dizziness and giddiness: Secondary | ICD-10-CM | POA: Diagnosis not present

## 2015-08-19 DIAGNOSIS — J449 Chronic obstructive pulmonary disease, unspecified: Secondary | ICD-10-CM | POA: Diagnosis not present

## 2015-08-19 DIAGNOSIS — J3089 Other allergic rhinitis: Secondary | ICD-10-CM

## 2015-08-19 DIAGNOSIS — R5383 Other fatigue: Secondary | ICD-10-CM | POA: Diagnosis not present

## 2015-08-19 DIAGNOSIS — J45909 Unspecified asthma, uncomplicated: Secondary | ICD-10-CM | POA: Diagnosis not present

## 2015-08-19 NOTE — Assessment & Plan Note (Deleted)
Ryan [name]'s history does not suggest that the fatigue/malaise is related to asthma/COPD exacerbation.  Based upon his concern about mild pneumonia, an order has been provided for a chest x-ray 2 views.  When chest x-ray results have returned, Ryan [name] will be called with further instructions.  He will continue the cardiology and primary care evaluation of this problem. 

## 2015-08-19 NOTE — Assessment & Plan Note (Signed)
Ryan [name]'s history does not suggest that the fatigue/malaise is related to asthma/COPD exacerbation.  Based upon his concern about mild pneumonia, an order has been provided for a chest x-ray 2 views.  When chest x-ray results have returned, Ryan [name] will be called with further instructions.  He will continue the cardiology and primary care evaluation of this problem.

## 2015-08-19 NOTE — Assessment & Plan Note (Signed)
For now, continue Symbicort 160/4.5, 2 inhalations via spacer device twice a day.  Continue montelukast 10 mg daily at bedtime.  Continue albuterol HFA, 1-2 inhalations every 4-6 hours as needed.  During respiratory tract infections and asthma flares, the patient may add Flovent 110 g, 2 inhalations via spacer device twice a day until symptoms have returned to baseline.  Subjective and objective measures of pulmonary function will be followed and the treatment plan will be adjusted accordingly.

## 2015-08-19 NOTE — Assessment & Plan Note (Signed)
Stable.  Continue fluticasone nasal spray and/or nasal saline irrigation as needed.

## 2015-08-19 NOTE — Patient Instructions (Addendum)
Asthma with COPD ([name])  For now, continue Symbicort 160/4.5, 2 inhalations via spacer device twice a day.  Continue montelukast 10 mg daily at bedtime.  Continue albuterol HFA, 1-2 inhalations every 4-6 hours as needed.  During respiratory tract infections and asthma flares, the patient may add Flovent 110 g, 2 inhalations via spacer device twice a day until symptoms have returned to baseline.  Subjective and objective measures of pulmonary function will be followed and the treatment plan will be adjusted accordingly.  Fatigue/malaise Ryan [name]'s history does not suggest that the fatigue/malaise is related to asthma/COPD exacerbation.  Based upon his concern about mild pneumonia, an order has been provided for a chest x-ray 2 views.  When chest x-ray results have returned, Ryan [name] will be called with further instructions.  He will continue the cardiology and primary care evaluation of this problem.  Allergic rhinitis with a nonallergic component Stable.  Continue fluticasone nasal spray and/or nasal saline irrigation as needed.   Return in about 4 months (around 12/20/2015), or if symptoms worsen or fail to improve.

## 2015-08-19 NOTE — Progress Notes (Signed)
Follow-up Note  RE: Ryan [name] MRN: [id number] DOB: [birth year] Date of Office Visit: 08/19/2015  Primary care provider: Chesley Noon, MD Referring provider: Chesley Noon, MD  History of present illness: HPI Comments: Ryan [name] is a 82 y.o. male with asthma/COPD and mixed rhinitis presents today for a sick visit.  He was last seen in this clinic on 03/26/2015.  Over the past 3 or 4 weeks he has been experiencing progressively increasing fatigue and malaise.  Stating that he feels "wiped out" and that his "energy is drained."  He denies fevers or chills.  He continues to experience dyspnea with mild/moderate exertion but denies chest tightness, coughing, or wheezing.  In fact, he states that his lower respiratory symptoms a been relatively stable despite his decline in energy.  He is undergoing cardiology evaluation, including EKG, echocardiogram, and Holter monitor.  He has also had lab work drawn by his primary care physician which he reports is negative.  He will be following up with cardiology on August 1.  Regarding the asthma/COPD, he is taking Symbicort 160/4.5 g, 2 inhalations via spacer device twice a day, montelukast at bedtime, and albuterol every 4-6 hours as needed.  If he requires albuterol, typically it is at nighttime.  He has no nasal symptom complaints today.  His most recent visit with the ophthalmologist was reassuring regarding glaucoma.   Assessment and plan: Asthma with COPD ([place])  For now, continue Symbicort 160/4.5, 2 inhalations via spacer device twice a day.  Continue montelukast 10 mg daily at bedtime.  Continue albuterol HFA, 1-2 inhalations every 4-6 hours as needed.  During respiratory tract infections and asthma flares, the patient may add Flovent 110 g, 2 inhalations via spacer device twice a day until symptoms have returned to baseline.  Subjective and objective measures of pulmonary function will be followed and the treatment plan  will be adjusted accordingly.  Fatigue/malaise [name]'s history does not suggest that the fatigue/malaise is related to asthma/COPD exacerbation.  Based upon his concern about mild pneumonia, an order has been provided for a chest x-ray 2 views.  When chest x-ray results have returned, [name] will be called with further instructions.  He will continue the cardiology and primary care evaluation of this problem.  Allergic rhinitis with a nonallergic component Stable.  Continue fluticasone nasal spray and/or nasal saline irrigation as needed.   Diagnositics: Spirometry reveals an FVC of 1.61 and an FEV1 of 1.40 without post bronchodilator improvement.    Please see scanned spirometry results for details.    Physical examination: Blood pressure 140/70, pulse 68, temperature 98.1 F (36.7 C), temperature source Oral, resp. rate 16, SpO2 94 %.  General: Alert, interactive, in no acute distress. HEENT: TMs pearly gray, turbinates moderately edematous without discharge, post-pharynx moderately erythematous. Neck: Supple without lymphadenopathy. Lungs: Mildly decreased breath sounds bilaterally without wheezing, rhonchi or rales. CV: Normal S1, S2 without murmurs. Skin: Warm and dry, without lesions or rashes.  The following portions of the patient's history were reviewed and updated as appropriate: allergies, current medications, past family history, past medical history, past social history, past surgical history and problem list.    Medication List       This list is accurate as of: 08/19/15  5:18 PM.  Always use your most recent med list.               ALPRAZolam 0.25 MG tablet  Commonly known as:  XANAX  Take 0.125-0.25 mg by mouth  2 (two) times daily as needed for anxiety. Reported on 08/19/2015     ELIQUIS 5 MG Tabs tablet  Generic drug:  apixaban  Take by mouth.     apixaban 5 MG Tabs tablet  Commonly known as:  ELIQUIS  Take 1 tablet (5 mg total) by mouth 2 (two) times  daily.     beclomethasone 80 MCG/ACT inhaler  Commonly known as:  QVAR  inhale 2 puffs by mouth every 12 hours     SYMBICORT 160-4.5 MCG/ACT inhaler  Generic drug:  budesonide-formoterol  Take by mouth.     budesonide-formoterol 160-4.5 MCG/ACT inhaler  Commonly known as:  SYMBICORT  INHALE TWO PUFFS TWICE DAILY TO PREVENT COUGH OR WHEEZE     carvedilol 6.25 MG tablet  Commonly known as:  COREG  Take by mouth.     CHOLECALCIFEROL PO  Take 800 Units by mouth daily. Reported on 08/15/2015     finasteride 5 MG tablet  Commonly known as:  PROSCAR  Take 1 tablet (5 mg total) by mouth daily.     fluticasone 220 MCG/ACT inhaler  Commonly known as:  FLOVENT HFA  Inhale 2 puffs into the lungs 2 (two) times daily.     furosemide 20 MG tablet  Commonly known as:  LASIX  Take 20 mg by mouth 2 (two) times daily.     LASIX 40 MG tablet  Generic drug:  furosemide  Take by mouth.     montelukast 10 MG tablet  Commonly known as:  SINGULAIR  take 1 tablet by mouth at bedtime     SINGULAIR 10 MG tablet  Generic drug:  montelukast     omeprazole 20 MG capsule  Commonly known as:  PRILOSEC  Take 20 mg by mouth daily.     polyethylene glycol packet  Commonly known as:  MIRALAX / GLYCOLAX  Take 17 g by mouth daily. Reported on 08/15/2015     potassium chloride SA 20 MEQ tablet  Commonly known as:  K-DUR,KLOR-CON  Take 20 mEq by mouth 2 (two) times daily.     rOPINIRole 1 MG tablet  Commonly known as:  REQUIP  Take 1 tablet (1 mg total) by mouth at bedtime.     sodium chloride 0.65 % Soln nasal spray  Commonly known as:  OCEAN  Place 1 spray into both nostrils daily.     traMADol 50 MG tablet  Commonly known as:  ULTRAM  Take 50 mg by mouth as needed.     traMADol 50 MG tablet  Commonly known as:  ULTRAM     travoprost (benzalkonium) 0.004 % ophthalmic solution  Commonly known as:  TRAVATAN  1 drop at bedtime.     traZODone 50 MG tablet  Commonly known as:  DESYREL    Take 50 mg by mouth at bedtime as needed.     triazolam 0.25 MG tablet  Commonly known as:  HALCION  Take 1 tablet by mouth at bedtime as needed.        Allergies  Allergen Reactions  . Molds & Smuts Other (See Comments)    Also dust mites causes sinus infections, h/a etc.  . Pollen Extract-Tree Extract Other (See Comments)    HEADACHES, TIRED , DRAINAGE FROM SINUSES     Review of systems: Constitutional: Positive for fatigue/malaise. Negative for fever, chills and weight loss.  HENT: Negative for nosebleeds.   Eyes: Negative for blurred vision.  Respiratory: Negative for hemoptysis.   Cardiovascular: Negative for chest  pain.  Gastrointestinal: Negative for diarrhea and constipation.  Genitourinary: Negative for dysuria.  Musculoskeletal: Negative for myalgias and joint pain.  Neurological: Negative for dizziness.  Endo/Heme/Allergies: Does not bruise/bleed easily.  Cutaneous: Negative for rash.  Past Medical History  Diagnosis Date  . Hyperlipemia   . Hypertension   . Benign prostatic hypertrophy   . Chronic fatigue   . Lactose intolerance   . Sleep disorder     DOES NOT SLEEP WELL  . Personal history of colonic polyps 2004    hyperplastic Dr. Collene Mares  . Gluten intolerance   . Barrett's esophagus   . Esophageal reflux   . Diverticulosis of colon (without mention of hemorrhage)   . Periaortic lymphadenopathy 02/16/2011  . Atelectasis, bilateral 02/10/11    Noted On CT Scan - Mild Dependent Atelectasis at the Lung Bases  . Hiatal hernia 02/10/11    Noted on CT Scan - Moderate Hiatal Hernia  . Renal cyst 02/10/11     Noted on CT Scan - Bilateral Renal Cysts  . S/P radiation therapy 03/15/11 - 04/09/11    Abdominal/ Pelvic Tumor, 3600 cGy/20 Fractions  . Microcytic anemia 04/20/2011   . Abdominal pain     Bloating and gas  . Gastritis   . Dysrhythmia     SKIPPED HEART BEAT SOMETIMES  . Environmental allergies   . Pain     LOWER BACK PAIN - DDD, SCOLIOSIS, BONE SPUR.   FINISHED THE 3RD EPIDURAL STEROID INJECTION 7/15 - DONE BY DR. Nelva Bush. - STILL HAVING BACK PAIN  . Osteoarthritis     hands/feet,knees, NECK, BACK  . Congestion of throat     mouth breath at night, dry mouth  . Bilateral lower extremity edema   . Hypertension   . Melanoma ([place])     lymphoma  . Lymphoma of lymph nodes in pelvis ([place]) 03/03/2011     Large Right Retroperitoneal Mass  . 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 Asthma     Family History  Problem Relation Age of Onset  . Heart disease Father     MI 63  . Urticaria Father   . Prostate cancer Brother   . Prostate cancer Paternal Uncle   . Prostate cancer Paternal Uncle   . Prostate cancer Paternal Uncle   . Colon cancer Neg Hx   . Allergic rhinitis Neg Hx   . Asthma Neg Hx   . Eczema Neg Hx   . Hyperlipidemia    . Stroke    . Hypertension    . ADD / ADHD      Social History   Social History  . Marital Status: Married    Spouse Name: N/A  . Number of Children: 2  . Years of Education: N/A   Occupational History  . 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 Smoking status: Former Smoker -- 1.00 packs/day for 35 years    Types: Pipe, Cigarettes    Quit date: 02/23/1992  . Smokeless tobacco: Never Used     Comment: quit 20 years ago  . Alcohol Use: Yes     Comment: 2 drinks daily scotch  AND WINE WITH SUPPER  . Drug Use: No  . Sexual Activity: Not on file   Other Topics Concern  . 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 Please do not hesitate to contact me with questions.  Sincerely,   R. Edgar Frisk, MD

## 2015-08-19 NOTE — Assessment & Plan Note (Deleted)
Stable.  Continue fluticasone nasal spray and/or nasal saline irrigation as needed. 

## 2015-08-21 ENCOUNTER — Telehealth: Payer: Self-pay | Admitting: *Deleted

## 2015-08-21 ENCOUNTER — Ambulatory Visit
Admission: RE | Admit: 2015-08-21 | Discharge: 2015-08-21 | Disposition: A | Discharge status: Home / Self Care | Payer: PPO | Source: Ambulatory Visit | Attending: Allergy and Immunology | Admitting: Allergy and Immunology

## 2015-08-21 DIAGNOSIS — R0602 Shortness of breath: Secondary | ICD-10-CM | POA: Diagnosis not present

## 2015-08-21 NOTE — Telephone Encounter (Signed)
Informed patient of results.

## 2015-08-21 NOTE — Telephone Encounter (Signed)
-----  Message from Adelina Mings, MD sent at 08/21/2015  1:15 PM EDT ----- Please inform the patient that the radiologist's interpretation revealed that his chest x-ray is unchanged from his previous x-rays.  Thanks.

## 2015-08-28 ENCOUNTER — Other Ambulatory Visit (HOSPITAL_COMMUNITY): Payer: Self-pay | Admitting: *Deleted

## 2015-08-28 ENCOUNTER — Telehealth: Payer: Self-pay | Admitting: Cardiovascular Disease

## 2015-08-28 DIAGNOSIS — I4891 Unspecified atrial fibrillation: Secondary | ICD-10-CM

## 2015-08-28 DIAGNOSIS — R002 Palpitations: Secondary | ICD-10-CM

## 2015-08-28 MED ORDER — APIXABAN 5 MG PO TABS: 5.0000 mg | ORAL_TABLET | Freq: Two times a day (BID) | ORAL | 0 refills | Status: DC

## 2015-08-28 NOTE — Telephone Encounter (Signed)
New message        *STAT* If patient is at the pharmacy, call can be transferred to refill team.   1. Which medications need to be refilled? (please list name of each medication and dose if known) Eliquis pharmacist unsure of dose  2. Which pharmacy/location (including street and city if local pharmacy) is medication to be sent to?[facility] on [address]  3. Do they need a 30 day or 90 day supply? 30 day supple    The wife brought in coupon for a free prescription and the pharmacy does not have script on file.

## 2015-08-28 NOTE — Telephone Encounter (Signed)
Returned call to patient. Patient was told by [facility] to reschedule appt with Dr Gwenlyn Found to be sooner in August.  Appt scheduled with Dr Gwenlyn Found on 09/17/15.  Patient verbalized understanding.

## 2015-08-28 NOTE — Telephone Encounter (Signed)
New message      The wife is calling and wanting a sooner appointment for husband because of pulmonary HTN and things. She states the other MD she went to told her to call back and make a sooner appointment she does not want him wanting to the end August.  The pt is seen at the CHF clinic cause of pulmonary HTN

## 2015-08-28 NOTE — Progress Notes (Signed)
He can see me 6 months after he sees Luke Kilroy

## 2015-08-29 ENCOUNTER — Telehealth: Payer: Self-pay | Admitting: Cardiovascular Disease

## 2015-08-29 NOTE — Telephone Encounter (Signed)
New message     Pt has an appt on 8/16 but needs to reschedule for 8/18. He was working with the nurse to be fit in. Please call.

## 2015-09-01 NOTE — Telephone Encounter (Signed)
F/u Message  Pt returning RN call . Please call back to discuss

## 2015-09-02 ENCOUNTER — Inpatient Hospital Stay (HOSPITAL_COMMUNITY): Admission: RE | Admit: 2015-09-02 | Payer: PPO | Source: Ambulatory Visit | Admitting: Nurse Practitioner

## 2015-09-04 NOTE — Telephone Encounter (Signed)
Returned call to patient. Made him aware that scheduling will be calling to look at rescheduling his appt. He said he could not make it on 09/17/15 because his wife could not be there. I explained that it may be difficult to reschedule appt in a timely manner. He verbalized understanding and was appreciate for the call.

## 2015-09-10 ENCOUNTER — Ambulatory Visit (INDEPENDENT_AMBULATORY_CARE_PROVIDER_SITE_OTHER): Payer: PPO | Admitting: Cardiology

## 2015-09-10 ENCOUNTER — Encounter: Payer: Self-pay | Admitting: Cardiology

## 2015-09-10 ENCOUNTER — Telehealth (HOSPITAL_COMMUNITY): Payer: Self-pay

## 2015-09-10 VITALS — BP 120/78 | HR 73 | Ht 67.0 in | Wt 183.2 lb

## 2015-09-10 DIAGNOSIS — M545 Low back pain: Secondary | ICD-10-CM | POA: Diagnosis not present

## 2015-09-10 DIAGNOSIS — I10 Essential (primary) hypertension: Secondary | ICD-10-CM

## 2015-09-10 DIAGNOSIS — G8929 Other chronic pain: Secondary | ICD-10-CM

## 2015-09-10 DIAGNOSIS — R0600 Dyspnea, unspecified: Secondary | ICD-10-CM | POA: Insufficient documentation

## 2015-09-10 DIAGNOSIS — R0609 Other forms of dyspnea: Secondary | ICD-10-CM

## 2015-09-10 NOTE — Assessment & Plan Note (Signed)
TSH WNL April 2017

## 2015-09-10 NOTE — Assessment & Plan Note (Signed)
Followed by Dr Ellene Route, epidural injections periodically

## 2015-09-10 NOTE — Patient Instructions (Signed)
Medication Instructions:  Your physician recommends that you continue on your current medications as directed. Please refer to the Current Medication list given to you today.  Labwork: None ordered  Testing/Procedures: Your physician has requested that you have a lexiscan myoview. For further information please visit [email or web address]. Please follow instruction sheet, as given.  Follow-Up: Per Lurena Joiner follow up will be determined after Lexiscan.  Any Other Special Instructions Will Be Listed Below (If Applicable).     If you need a refill on your cardiac medications before your next appointment, please call your pharmacy.

## 2015-09-10 NOTE — Assessment & Plan Note (Signed)
Diagnosed Jan '13:  Followed by Dr. Beryle Beams and Dr. Lanell Persons

## 2015-09-10 NOTE — Progress Notes (Signed)
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 He recently was though to have AF but review of a Holter monitor showed NSR with PACs. Echo 08/18/15 showed pulmonary HTN with PA pressure of 58 mmHg, grade 1 DD, and normal LVF. He has been on Eliquis but this has been stopped. He presents today with complaints of profound fatigue and DOE. This started about three weeks ago. He denies chest pain. He did tell me his symptoms improved over the last three days or so.    Current Outpatient Prescriptions  Medication Sig Dispense Refill  . ALPRAZolam (XANAX) 0.25 MG tablet Take 0.125-0.25 mg by mouth 2 (two) times daily as needed for anxiety. Reported on 08/19/2015    . atorvastatin (LIPITOR) 20 MG tablet Take 20 mg by mouth daily.    . budesonide-formoterol (SYMBICORT) 160-4.5 MCG/ACT inhaler INHALE TWO PUFFS TWICE DAILY TO PREVENT COUGH OR WHEEZE 10.2 Inhaler 5  . carvedilol (COREG) 6.25 MG tablet Take 6.25 mg by mouth 2 (two) times daily with a meal.     . CHOLECALCIFEROL PO Take 800 Units by mouth daily. Reported on 08/15/2015    . EPINEPHrine 0.15 MG/0.15ML IJ injection Inject into the muscle.    . finasteride (PROSCAR) 5 MG tablet Take 1 tablet (5 mg total) by mouth daily. 30 tablet 5  . fluticasone (FLOVENT HFA) 220 MCG/ACT inhaler Inhale 2 puffs into the lungs 2 (two) times daily. 12 g 5  . furosemide (LASIX) 20 MG tablet Take 20 mg by mouth 2 (two) times daily.    . Lutein-Zeaxanthin 6-0.24 MG CAPS Take by mouth.    . montelukast (SINGULAIR) 10 MG tablet take 1 tablet by mouth at bedtime    . omeprazole (PRILOSEC) 20 MG capsule Take 20 mg by mouth daily.    . polyethylene glycol (MIRALAX / GLYCOLAX) packet Take 17 g by mouth daily. Reported on 08/15/2015    . potassium chloride SA  (K-DUR,KLOR-CON) 20 MEQ tablet Take 20 mEq by mouth 2 (two) times daily.    Marland Kitchen rOPINIRole (REQUIP) 1 MG tablet Take 1 tablet (1 mg total) by mouth at bedtime. 90 tablet 3  . sodium chloride (OCEAN) 0.65 % SOLN nasal spray Place 1 spray into both nostrils daily.    . tamsulosin (FLOMAX) 0.4 MG CAPS capsule   0  . traMADol (ULTRAM) 50 MG tablet Take 50 mg by mouth as needed.    . travoprost, benzalkonium, (TRAVATAN) 0.004 % ophthalmic solution 1 drop at bedtime.    . traZODone (DESYREL) 50 MG tablet Take 50 mg by mouth at bedtime as needed.    . triazolam (HALCION) 0.25 MG tablet Take 1 tablet by mouth at bedtime as needed.     Current Facility-Administered Medications  Medication Dose Route Frequency Provider Last Rate Last Dose  . ipratropium (ATROVENT) nebulizer solution 0.5 mg  0.5 mg Nebulization Once Adelina Mings, MD      . levalbuterol Penne Lash) nebulizer solution 1.25 mg  1.25 mg Nebulization Once Adelina Mings, MD        Allergies  Allergen Reactions  . Pollen Extract-Tree Extract Other (See Comments)    HEADACHES, TIRED , DRAINAGE FROM SINUSES  . Molds & Smuts Other (See Comments)    Also dust mites causes sinus  infections, h/a etc.    Social History   Social History  . Marital status: Married    Spouse name: N/A  . Number of children: 2  . Years of education: N/A   Occupational History  . 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 Smoking status: Former Smoker    Packs/day: 1.00    Years: 35.00    Types: Pipe, Cigarettes    Quit date: 02/23/1992  . Smokeless tobacco: Never Used     Comment: quit 20 years ago  . Alcohol use Yes     Comment: 2 drinks daily scotch  AND WINE WITH SUPPER  . Drug use: No  . Sexual activity: Not on file   Other Topics Concern  . Not on file   Social History Narrative   Married - second marriage   He has two children   Retired Horticulturist, commercial Professor   Currently teaches pottery  making, 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[facility]   Former Smoker quit 12 years ago- smoked for 35 years   Alcohol use- yes     Review of Systems: General: negative for chills, fever, night sweats or weight changes.  Cardiovascular: negative for chest pain, dyspnea on exertion, edema, orthopnea, palpitations, paroxysmal nocturnal dyspnea or shortness of breath Dermatological: negative for rash Respiratory: negative for cough or wheezing Urologic: negative for hematuria Abdominal: negative for nausea, vomiting, diarrhea, bright red blood per rectum, melena, or hematemesis Neurologic: negative for visual changes, syncope, or dizziness All other systems reviewed and are otherwise negative except as noted above.    Blood pressure 120/78, pulse 73, height _0  (1.702 m), weight 183 lb 3.2 oz (83.1 kg), SpO2 93 %.  General appearance: alert, cooperative and no distress Neck: no carotid bruit and no JVD Lungs: clear to auscultation bilaterally Heart: regular rate and rhythm Extremities: trace edema Skin: Skin color, texture, turgor normal. No rashes or lesions Neurologic: Grossly normal   ASSESSMENT AND PLAN:   Exertional dyspnea His main complaint today is exertional fatigue and dyspnea for the past 3 weeks. This has been profound, pt "hardly able to walk"  Fatigue/malaise TSH WNL April 2017  Essential hypertension B/P controlled  Chronic lower back pain Followed by Dr Ellene Route, epidural injections periodically  Non Hodgkin's lymphoma [facility]) Diagnosed Jan '13:  Followed by Dr. Beryle Beams and Dr. Lanell Persons   PLAN  R/O anginal equivalent. Check Lexiscan Myoview. TSH was WNL in April. F/U pending Myoview results.   Kerin Ransom PA-C 09/10/2015 9:31 AM

## 2015-09-10 NOTE — Assessment & Plan Note (Signed)
B/P controlled

## 2015-09-10 NOTE — Telephone Encounter (Signed)
Encounter complete. 

## 2015-09-10 NOTE — Assessment & Plan Note (Signed)
His main complaint today is exertional fatigue and dyspnea for the past 3 weeks. This has been profound, pt "hardly able to walk"

## 2015-09-12 ENCOUNTER — Ambulatory Visit (HOSPITAL_COMMUNITY)
Admission: RE | Admit: 2015-09-12 | Discharge: 2015-09-12 | Disposition: A | Discharge status: Home / Self Care | Payer: PPO | Source: Ambulatory Visit | Attending: Cardiovascular Disease | Admitting: Cardiovascular Disease

## 2015-09-12 DIAGNOSIS — E669 Obesity, unspecified: Secondary | ICD-10-CM | POA: Insufficient documentation

## 2015-09-12 DIAGNOSIS — Z6828 Body mass index (BMI) 28.0-28.9, adult: Secondary | ICD-10-CM | POA: Diagnosis not present

## 2015-09-12 DIAGNOSIS — I10 Essential (primary) hypertension: Secondary | ICD-10-CM | POA: Diagnosis not present

## 2015-09-12 DIAGNOSIS — R5383 Other fatigue: Secondary | ICD-10-CM | POA: Insufficient documentation

## 2015-09-12 DIAGNOSIS — Z87891 Personal history of nicotine dependence: Secondary | ICD-10-CM | POA: Insufficient documentation

## 2015-09-12 DIAGNOSIS — R0609 Other forms of dyspnea: Secondary | ICD-10-CM | POA: Diagnosis not present

## 2015-09-12 LAB — MYOCARDIAL PERFUSION IMAGING
LV dias vol: 91 mL (ref 62–150)
LV sys vol: 41 mL
Peak HR: 75 {beats}/min
Rest HR: 59 {beats}/min
SDS: 1
SRS: 2
SSS: 3
TID: 1.1

## 2015-09-12 MED ORDER — REGADENOSON 0.4 MG/5ML IV SOLN
0.4000 mg | Freq: Once | INTRAVENOUS | Status: AC
  Administered 2015-09-12: 0.4 mg via INTRAVENOUS

## 2015-09-12 MED ORDER — TECHNETIUM TC 99M TETROFOSMIN IV KIT
10.7000 | PACK | Freq: Once | INTRAVENOUS | Status: AC | PRN
  Administered 2015-09-12: 11 via INTRAVENOUS
  Filled 2015-09-12: qty 11

## 2015-09-12 MED ORDER — TECHNETIUM TC 99M TETROFOSMIN IV KIT
30.9000 | PACK | Freq: Once | INTRAVENOUS | Status: AC | PRN
  Administered 2015-09-12: 30.9 via INTRAVENOUS
  Filled 2015-09-12: qty 31

## 2015-09-17 ENCOUNTER — Ambulatory Visit: Payer: PPO | Admitting: Cardiovascular Disease

## 2015-09-22 DIAGNOSIS — I10 Essential (primary) hypertension: Secondary | ICD-10-CM | POA: Diagnosis not present

## 2015-09-22 DIAGNOSIS — N179 Acute kidney failure, unspecified: Secondary | ICD-10-CM | POA: Diagnosis not present

## 2015-09-24 ENCOUNTER — Telehealth: Payer: Self-pay | Admitting: *Deleted

## 2015-09-24 NOTE — Telephone Encounter (Signed)
Message from pt's wife reporting he has declined over the past 1 month. She reports dyspnea and weakness. He is too weak to drive. They have seen PCP and cardiology; ruled out cardiopulmonary or thyroid issues. She is requesting appt with Dr. Benay Spice ASAP. Wife is unable to bring him in this week or on 8/29 but any other day will work.  Forwarded request to Dr. Benay Spice for appt time.

## 2015-09-25 ENCOUNTER — Telehealth: Payer: Self-pay | Admitting: *Deleted

## 2015-09-25 NOTE — Telephone Encounter (Signed)
Spoke with pt and informed him that Dr. Benay Spice can see him on Thursday, 10/02/15 at 4:00PM.  Patient requested that I call his wife on her cell phone to give her appt information.  Message left on Jane's private cell phone with appt information.

## 2015-09-26 ENCOUNTER — Telehealth: Payer: Self-pay | Admitting: Oncology

## 2015-09-26 NOTE — Telephone Encounter (Signed)
APPT CONF WITH PATIENT, PER 08/24 [facility] MSG.Marland Kitchen

## 2015-10-01 ENCOUNTER — Telehealth: Payer: Self-pay | Admitting: *Deleted

## 2015-10-01 NOTE — Telephone Encounter (Signed)
Called to offer earlier appt on 8/31, wife is unable to bring pt in AM. Will come in as scheduled.

## 2015-10-02 ENCOUNTER — Ambulatory Visit: Payer: PPO | Admitting: Cardiology

## 2015-10-02 ENCOUNTER — Ambulatory Visit (HOSPITAL_BASED_OUTPATIENT_CLINIC_OR_DEPARTMENT_OTHER): Payer: PPO | Admitting: Oncology

## 2015-10-02 VITALS — BP 149/84 | HR 68 | Temp 98.3°F | Resp 17 | Ht 67.0 in | Wt 182.9 lb

## 2015-10-02 DIAGNOSIS — Z8572 Personal history of non-Hodgkin lymphomas: Secondary | ICD-10-CM

## 2015-10-02 DIAGNOSIS — R5381 Other malaise: Secondary | ICD-10-CM

## 2015-10-02 DIAGNOSIS — H401131 Primary open-angle glaucoma, bilateral, mild stage: Secondary | ICD-10-CM | POA: Diagnosis not present

## 2015-10-02 DIAGNOSIS — H353131 Nonexudative age-related macular degeneration, bilateral, early dry stage: Secondary | ICD-10-CM | POA: Diagnosis not present

## 2015-10-02 DIAGNOSIS — L989 Disorder of the skin and subcutaneous tissue, unspecified: Secondary | ICD-10-CM | POA: Diagnosis not present

## 2015-10-02 DIAGNOSIS — C8599 Non-Hodgkin lymphoma, unspecified, extranodal and solid organ sites: Secondary | ICD-10-CM

## 2015-10-02 NOTE — Progress Notes (Signed)
  [facility] OFFICE PROGRESS NOTE   Diagnosis: Non-Hodgkin's lymphoma  INTERVAL HISTORY:   He returns prior to scheduled visit. He reports profound malaise for the past 4-6 weeks. He becomes fatigued when talking or performing activities. No fever, night sweats, or anorexia. He has noted tenderness at the left nipple for the past week. He is also noted a "lump "at the left upper arm. No difficulty with bowel or bladder function. Chronic back pain. Ryan [name] reports being evaluated by pulmonary medicine and cardiology with no explanation for the malaise found. He was started on carvedilol several months ago after he was diagnosed with renal insufficiency while taking Benzapril. The Benzapril was discontinued.  A chest x-ray 08/21/2015 revealed bibasilar atelectasis/scarring.  Objective:  Vital signs in last 24 hours:  Blood pressure (!) 149/84, pulse 68, temperature 98.3 F (36.8 C), temperature source Oral, resp. rate 17, height _0  (1.702 m), weight 182 lb 14.4 oz (83 kg), SpO2 94 %.    HEENT: Neck without mass Lymphatics: No cervical, supraclavicular, axillary, or inguinal nodes Resp: Inspiratory rales at the posterior bases bilaterally, no respiratory distress Cardio: Regular rate and rhythm GI: No hepatosplenomegaly, nontender, no mass Vascular: No leg edema, the left lower leg is slightly larger than the right side  Skin: Soft 1-2 cm lesion in the subcutaneous tissue at the left upper posterior arm with the consistency of a lipoma. Breasts: The left nipple appears unremarkable. Left breast without mass.    Lab Results:  Lab Results  Component Value Date   WBC 7.9 08/15/2015   HGB 14.5 08/15/2015   HCT 46.4 08/15/2015   MCV 99.8 08/15/2015   PLT 274 08/15/2015   NEUTROABS 3.9 06/26/2015  08/12/2015 at [facility]: Creatinine 0.98, BUN 31   Medications: I have reviewed the patient's current medications.  Assessment/Plan: 1. Non-Hodgkin's,,  low-grade B cell diagnosed in January 2013, disease localized to the pelvis, status post involved field radiation to 3600cGy completed in March of 2013, no evidence of recurrent lymphoma on a CT 04/17/2013  No evidence of recurrent lymphoma on CTs of the chest, abdomen, and pelvis 12/24/2014  2. Intermittent abdominal pain-etiology unclear  3. Chronic back pain-followed by Dr.Elsner status post epidural steroid injections and decompression surgery 12/31/2013  4. Anemia-history of iron deficiency, normal ferritin in August 2015 and November 2015    Disposition:  Ryan [name] presents for an unscheduled visit with a complaint of profound malaise. No symptoms to suggest progression of the non-Hodgkin's lymphoma. No clinical evidence of another malignancy. The cutaneous lesion at the left upper arm appears to be a lipoma.  I reviewed his medication list. He was recently started on carvedilol. He will discontinue carvedilol for one week to see if this helps the malaise. He will contact us with a report on his symptoms.  He plans to continue follow-up with his primary physician for ongoing evaluation of the malaise. I am available to see him as needed.  He will return for an office visit as scheduled in January.  Ryan Coder, MD  10/02/2015  4:29 PM

## 2015-10-07 ENCOUNTER — Ambulatory Visit: Payer: PPO | Admitting: Cardiovascular Disease

## 2015-10-11 ENCOUNTER — Encounter (HOSPITAL_COMMUNITY): Payer: Self-pay | Admitting: Emergency Medicine

## 2015-10-11 ENCOUNTER — Ambulatory Visit (HOSPITAL_COMMUNITY)
Admission: EM | Admit: 2015-10-11 | Discharge: 2015-10-11 | Disposition: A | Discharge status: Home / Self Care | Payer: PPO | Attending: Internal Medicine | Admitting: Internal Medicine

## 2015-10-11 DIAGNOSIS — IMO0001 Reserved for inherently not codable concepts without codable children: Secondary | ICD-10-CM

## 2015-10-11 DIAGNOSIS — R030 Elevated blood-pressure reading, without diagnosis of hypertension: Secondary | ICD-10-CM | POA: Diagnosis not present

## 2015-10-11 NOTE — ED Triage Notes (Signed)
Patient reports he has had issues with lethargy.  Reports having seen all pcp and specialists associated with his health.  Reports cardiology found no problem explaining feeling so tired.  Patient reports oncologist suggested stopping blood pressure  medicine, and patient did stop blood pressure  medicine.   Patient noticed blood pressure medicine fluctuating and getting blood pressure readings at home of 017 systolic .    Patient resumed taking blood pressure medicine on Wednesday due to high blood pressure readings.    Today went to pharmacy and wanted to verify blood pressure readings he is getting at home with readings on pharmacy equipment.  Patient mentioned readings to pharmacist.  Reports pharmacist did a manual blood pressure reading -190/110.  Pharmacist staff member drove patient to our location for evaluation.  Patient denies pain.  And no improvement in feeling tired

## 2015-10-11 NOTE — Discharge Instructions (Signed)
Continue current dose of carvedilol for blood pressure.  Check blood pressure 1-2 times daily and try not to react to any one reading unless you are having new chest pain, change in your breathing, new headache: go to the ER if these symptoms occur with elevated blood pressure. Check in with your primary care provider in a week or two if blood pressure continues to be highly variable.

## 2015-10-11 NOTE — ED Provider Notes (Signed)
[facility]    CSN: [id number] Arrival date & time: 10/11/15  1514  First Provider Contact:  First MD Initiated Contact with Patient 10/11/15 1744        History   Chief Complaint Chief Complaint  Patient presents with  . Hypertension    HPI Ryan [name] is a 82 y.o. male. He has complicated medical history that includes lymphoma, mild diastolic dysfunction, hypertension, asthma/COPD. He's had difficulty with malaise in the last several weeks, and has had extensive cardiopulmonary workup for this, with no clear etiology defined. His cancer doctor said that he was still in remission from his lymphoma.  Patient stopped his carvedilol for 3 days, and then restarted it about 3 days ago. He is noticing a lot of variability in his blood pressure, with excursions as high as 200, making him uncomfortable. He is not having chest discomfort, change in breathing, headache. He sometimes feels a little off balance with this. Currently he is taking some Lasix and carvedilol 6.25 mg twice a day.    HPI  Past Medical History:  Diagnosis Date  . Abdominal pain    Bloating and gas  . Asthma   . Atelectasis, bilateral 02/10/11   Noted On CT Scan - Mild Dependent Atelectasis at the Lung Bases  . Barrett's esophagus   . Benign prostatic hypertrophy   . Bilateral lower extremity edema   . Chronic fatigue   . Congestion of throat    mouth breath at night, dry mouth  . Diverticulosis of colon (without mention of hemorrhage)   . Dysrhythmia    SKIPPED HEART BEAT SOMETIMES  . Environmental allergies   . Esophageal reflux   . Gastritis   . Gluten intolerance   . Hiatal hernia 02/10/11   Noted on CT Scan - Moderate Hiatal Hernia  . Hyperlipemia   . Hypertension   . Hypertension   . Lactose intolerance   . 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 Melanoma ([place])    lymphoma  . Microcytic anemia 04/20/2011   . 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 Osteoarthritis    hands/feet,knees, NECK, BACK  . Pain    LOWER BACK PAIN - DDD, SCOLIOSIS, BONE SPUR.  FINISHED THE 3RD EPIDURAL STEROID INJECTION 7/15 - DONE BY DR. Nelva Bush. - STILL HAVING BACK PAIN  . Periaortic lymphadenopathy 02/16/2011  . Personal history of colonic polyps 2004   hyperplastic Dr. Collene Mares  . Renal cyst 02/10/11    Noted on CT Scan - Bilateral Renal Cysts  . S/P radiation therapy 03/15/11 - 04/09/11   Abdominal/ Pelvic Tumor, 3600 cGy/20 Fractions  . Sleep disorder    DOES NOT SLEEP WELL    Patient Active Problem List   Diagnosis Date Noted  . Exertional dyspnea 09/10/2015  . Fatigue/malaise 08/19/2015  . Asthma with COPD ([place]) 02/12/2015  . 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 Acute on chronic diastolic heart failure ([place]) 01/06/2014  . Hypoxemia 01/05/2014  . Essential hypertension 01/05/2014  . Hypokalemia 01/05/2014  . Urinary retention 01/05/2014  . Hypoxia   . Lumbar radiculopathy 12/31/2013  . RLS (restless legs syndrome) 09/21/2012  . Chronic lower back pain 06/15/2012  . Abdominal pain, unspecified site 05/19/2012  . Unspecified deficiency anemia 04/07/2012  . Melanoma ([place])   . Non Hodgkin's lymphoma ([place])   . Periaortic lymphadenopathy 02/16/2011  . Barrett's esophagus  07/09/2010  . Personal history of colonic polyps 05/28/2010  . History of IBS 05/28/2010  . Diverticulosis of colon (without mention of hemorrhage) 05/28/2010  . Arthritis involving multiple sites 05/28/2010  . Wheat intolerance ([place]) 05/28/2010  . Chronic venous insufficiency 08/19/2009  . PEDAL EDEMA 08/19/2009  . INSOMNIA, CHRONIC 12/24/2008  . EUSTACHIAN TUBE DYSFUNCTION, BILATERAL 12/23/2008  . ERECTILE DYSFUNCTION, ORGANIC 08/01/2008  . Allergic rhinitis with a nonallergic component 05/20/2008  . VENTRAL HERNIA 02/12/2008  . PALPITATIONS 12/25/2007  . LACTOSE INTOLERANCE 10/17/2007  .  HYPERLIPIDEMIA 09/14/2006  . HYPERTENSION 09/14/2006  . BPH (benign prostatic hyperplasia) 09/14/2006  . OSTEOARTHRITIS 09/14/2006  . HEART MURMUR, HX OF 09/14/2006    Past Surgical History:  Procedure Laterality Date  . ARTHROSCOPIC REPAIR ACL     right  . BONE MARROW ASPIRATION  02/25/11   Bone Marrow, Aspirate, Clot, and Bilateral Bx, Right PIC  . CATARACT EXTRACTION, BILATERAL    . EYE SURGERY    . HERNIA REPAIR     LEFT INGUINAL   . INSERTION OF MESH N/A 08/23/2012   Procedure: INSERTION OF MESH;  Surgeon: Madilyn Hook, DO;  Location: [facility];  Service: General;  Laterality: N/A;  . LUMBAR LAMINECTOMY/DECOMPRESSION MICRODISCECTOMY Right 12/31/2013   Procedure: RIGHT L4-5 L5-S1 LAMINECTOMY;  Surgeon: Kristeen Miss, MD;  Location: [facility];  Service: Neurosurgery;  Laterality: Right;  RIGHT L4-5 L5-S1 LAMINECTOMY  . ROTATOR CUFF REPAIR     right  . TONSILLECTOMY    . TONSILLECTOMY    . VENTRAL HERNIA REPAIR N/A 08/23/2012   Procedure: HERNIA REPAIR VENTRAL ADULT;  Surgeon: Madilyn Hook, DO;  Location: [facility];  Service: General;  Laterality: N/A;       Home Medications    Prior to Admission medications   Medication Sig Start Date End Date Taking? Authorizing Provider  ALBUTEROL SULFATE HFA IN Inhale into the lungs as needed.    Historical Provider, MD  ALPRAZolam Duanne Moron) 0.25 MG tablet Take 0.125-0.25 mg by mouth 2 (two) times daily as needed for anxiety. Reported on 08/19/2015    Historical Provider, MD  atorvastatin (LIPITOR) 20 MG tablet Take 20 mg by mouth daily. 05/08/15 05/07/16  Historical Provider, MD  budesonide-formoterol (SYMBICORT) 160-4.5 MCG/ACT inhaler INHALE TWO PUFFS TWICE DAILY TO PREVENT COUGH OR WHEEZE 06/24/15   Jiles Prows, MD  carvedilol (COREG) 6.25 MG tablet Take 6.25 mg by mouth 2 (two) times daily with a meal.     Historical Provider, MD  CHOLECALCIFEROL PO Take 800 Units by mouth daily. Reported on 08/15/2015    Historical Provider, MD  EPINEPHrine  0.15 MG/0.15ML IJ injection Inject into the muscle.    Historical Provider, MD  finasteride (PROSCAR) 5 MG tablet Take 1 tablet (5 mg total) by mouth daily. 12/25/12   Neena Rhymes, MD  fluticasone (FLOVENT HFA) 220 MCG/ACT inhaler Inhale 2 puffs into the lungs 2 (two) times daily. 07/16/15   Adelina Mings, MD  furosemide (LASIX) 20 MG tablet Take 20 mg by mouth 2 (two) times daily.    Historical Provider, MD  Lutein-Zeaxanthin 6-0.24 MG CAPS Take by mouth.    Historical Provider, MD  montelukast (SINGULAIR) 10 MG tablet take 1 tablet by mouth at bedtime 05/29/14   Historical Provider, MD  omeprazole (PRILOSEC) 20 MG capsule Take 20 mg by mouth daily.    Historical Provider, MD  potassium chloride SA (K-DUR,KLOR-CON) 20 MEQ tablet Take 20 mEq by mouth 2 (two) times daily.  Historical Provider, MD  rOPINIRole (REQUIP) 1 MG tablet Take 1 tablet (1 mg total) by mouth at bedtime. 02/12/13   Neena Rhymes, MD  sodium chloride (OCEAN) 0.65 % SOLN nasal spray Place 1 spray into both nostrils daily.    Historical Provider, MD  tamsulosin (FLOMAX) 0.4 MG CAPS capsule  08/22/15   Historical Provider, MD  traMADol (ULTRAM) 50 MG tablet Take 50 mg by mouth as needed.    Historical Provider, MD  travoprost, benzalkonium, (TRAVATAN) 0.004 % ophthalmic solution 1 drop at bedtime.    Historical Provider, MD  traZODone (DESYREL) 100 MG tablet Take 100 mg by mouth at bedtime. 09/11/15   Historical Provider, MD  triazolam (HALCION) 0.25 MG tablet Take 1 tablet by mouth at bedtime as needed. 02/25/14   Historical Provider, MD    Family History Family History  Problem Relation Age of Onset  . Heart disease Father     MI 68  . Urticaria Father   . Prostate cancer Brother   . Prostate cancer Paternal Uncle   . Prostate cancer Paternal Uncle   . Prostate cancer Paternal Uncle   . Hyperlipidemia    . Stroke    . Hypertension    . ADD / ADHD    . Colon cancer Neg Hx   . Allergic rhinitis Neg Hx   .  Asthma Neg Hx   . Eczema Neg Hx     Social History Social History  Substance Use Topics  . Smoking status: Former Smoker    Packs/day: 1.00    Years: 35.00    Types: Pipe, Cigarettes    Quit date: 02/23/1992  . Smokeless tobacco: Never Used     Comment: quit 20 years ago  . Alcohol use Yes     Comment: 2 drinks daily scotch  AND WINE WITH SUPPER     Allergies   Pollen extract-tree extract and Molds & smuts   Review of Systems Review of Systems  All other systems reviewed and are negative.   Physical Exam Triage Vital Signs ED Triage Vitals [10/11/15 1525]  Enc Vitals Group     BP 170/94     Pulse Rate 61     Resp 16     Temp 98.9 F (37.2 C)     Temp Source Oral     SpO2 97 %     Weight      Height    Updated Vital Signs BP 152/100 (BP Location: Left Arm)   Pulse 61   Temp 98.9 F (37.2 C) (Oral)   Resp 16   SpO2 97%  Physical Exam  Constitutional: He is oriented to person, place, and time. No distress.  Alert, nicely groomed  HENT:  Head: Atraumatic.  Eyes:  Conjugate gaze, no eye redness/drainage  Neck: Neck supple.  Cardiovascular: Normal rate.   Heart rhythm is irregularly irregular consistent with a history of atrial fibrillation  Pulmonary/Chest: No respiratory distress. He has no wheezes.  Lungs clear, symmetric breath sounds with some coarseness in the bilateral bases  Abdominal: He exhibits no distension.  Musculoskeletal: Normal range of motion.  Neurological: He is alert and oriented to person, place, and time.  Skin: Skin is warm and dry.  No cyanosis 2-3+ symmetric pitting edema in bilateral lower legs, patient says this is improved from usual, over the last few days  Nursing note and vitals reviewed.    UC Treatments / Results   Procedures Procedures (including critical care time)  None  Final Clinical Impressions(s) / UC Diagnoses   Final diagnoses:  Elevated blood pressure   Continue current dose of carvedilol. Try  to check blood pressure only once or twice daily, at the same times, and not to react to any single blood pressure reading, unless it is accompanied by chest pain, change in breathing, headache. If the symptoms occur, go to the emergency room. Follow up with PCP in a week or so if blood pressure continues to be variable.     Sherlene Shams, MD 10/12/15 [phone]

## 2015-10-14 ENCOUNTER — Telehealth: Payer: Self-pay | Admitting: *Deleted

## 2015-10-14 NOTE — Telephone Encounter (Signed)
Message from pt with an update: he has not noticed any improvement or change in his energy level since stopping Coreg. He reports BP has been erratic and has gone up to 200/113. BP is back down to 140/90.  Message forwarded to Dr. Benay Spice.

## 2015-11-21 DIAGNOSIS — M5416 Radiculopathy, lumbar region: Secondary | ICD-10-CM | POA: Diagnosis not present

## 2015-11-21 DIAGNOSIS — M4726 Other spondylosis with radiculopathy, lumbar region: Secondary | ICD-10-CM | POA: Diagnosis not present

## 2015-11-21 DIAGNOSIS — M5136 Other intervertebral disc degeneration, lumbar region: Secondary | ICD-10-CM | POA: Diagnosis not present

## 2015-11-21 DIAGNOSIS — M48061 Spinal stenosis, lumbar region without neurogenic claudication: Secondary | ICD-10-CM | POA: Diagnosis not present

## 2015-11-24 DIAGNOSIS — R5382 Chronic fatigue, unspecified: Secondary | ICD-10-CM | POA: Diagnosis not present

## 2015-11-24 DIAGNOSIS — I10 Essential (primary) hypertension: Secondary | ICD-10-CM | POA: Diagnosis not present

## 2015-11-26 DIAGNOSIS — M5417 Radiculopathy, lumbosacral region: Secondary | ICD-10-CM | POA: Diagnosis not present

## 2015-11-26 DIAGNOSIS — Z6829 Body mass index (BMI) 29.0-29.9, adult: Secondary | ICD-10-CM | POA: Diagnosis not present

## 2015-11-26 DIAGNOSIS — I10 Essential (primary) hypertension: Secondary | ICD-10-CM | POA: Diagnosis not present

## 2015-12-10 DIAGNOSIS — I10 Essential (primary) hypertension: Secondary | ICD-10-CM | POA: Diagnosis not present

## 2015-12-30 DIAGNOSIS — H401131 Primary open-angle glaucoma, bilateral, mild stage: Secondary | ICD-10-CM | POA: Diagnosis not present

## 2015-12-30 DIAGNOSIS — H353131 Nonexudative age-related macular degeneration, bilateral, early dry stage: Secondary | ICD-10-CM | POA: Diagnosis not present

## 2016-02-24 DIAGNOSIS — R609 Edema, unspecified: Secondary | ICD-10-CM | POA: Diagnosis not present

## 2016-02-24 DIAGNOSIS — R918 Other nonspecific abnormal finding of lung field: Secondary | ICD-10-CM | POA: Diagnosis not present

## 2016-02-24 DIAGNOSIS — R5383 Other fatigue: Secondary | ICD-10-CM | POA: Diagnosis not present

## 2016-02-24 DIAGNOSIS — R079 Chest pain, unspecified: Secondary | ICD-10-CM | POA: Diagnosis not present

## 2016-02-26 ENCOUNTER — Ambulatory Visit: Payer: PPO | Admitting: Oncology

## 2016-03-11 DIAGNOSIS — M545 Low back pain: Secondary | ICD-10-CM | POA: Diagnosis not present

## 2016-03-11 DIAGNOSIS — I10 Essential (primary) hypertension: Secondary | ICD-10-CM | POA: Diagnosis not present

## 2016-03-11 DIAGNOSIS — R609 Edema, unspecified: Secondary | ICD-10-CM | POA: Diagnosis not present

## 2016-03-11 DIAGNOSIS — G8929 Other chronic pain: Secondary | ICD-10-CM | POA: Diagnosis not present

## 2016-03-25 DIAGNOSIS — R609 Edema, unspecified: Secondary | ICD-10-CM | POA: Diagnosis not present

## 2016-03-25 DIAGNOSIS — I10 Essential (primary) hypertension: Secondary | ICD-10-CM | POA: Diagnosis not present

## 2016-03-31 DIAGNOSIS — R339 Retention of urine, unspecified: Secondary | ICD-10-CM | POA: Diagnosis not present

## 2016-03-31 DIAGNOSIS — I4891 Unspecified atrial fibrillation: Secondary | ICD-10-CM | POA: Diagnosis not present

## 2016-03-31 DIAGNOSIS — K5901 Slow transit constipation: Secondary | ICD-10-CM | POA: Diagnosis not present

## 2016-03-31 DIAGNOSIS — R609 Edema, unspecified: Secondary | ICD-10-CM | POA: Diagnosis not present

## 2016-04-02 ENCOUNTER — Telehealth: Payer: Self-pay | Admitting: Cardiovascular Disease

## 2016-04-02 ENCOUNTER — Encounter: Payer: Self-pay | Admitting: Cardiovascular Disease

## 2016-04-02 ENCOUNTER — Ambulatory Visit (INDEPENDENT_AMBULATORY_CARE_PROVIDER_SITE_OTHER): Payer: PPO | Admitting: Cardiovascular Disease

## 2016-04-02 DIAGNOSIS — M4726 Other spondylosis with radiculopathy, lumbar region: Secondary | ICD-10-CM | POA: Diagnosis not present

## 2016-04-02 DIAGNOSIS — R002 Palpitations: Secondary | ICD-10-CM

## 2016-04-02 DIAGNOSIS — M5416 Radiculopathy, lumbar region: Secondary | ICD-10-CM | POA: Diagnosis not present

## 2016-04-02 NOTE — Telephone Encounter (Signed)
Spoke with pt wife, the patient went to have his epidural injection and they were concerned because his ECG was erratic. The ECG she was given reads sinus with PAC;s, RBBB and non-specific ST changes. They also told her that his oxygen level dropped to the 70"S during the procedure and his lips turned purple. They had to give him oxygen. The patient has had no chest pain, SOB or palpitations prior to today. His only problem is extreme fatigue. They told the wife to call us as soon as they got home and the patient needs to be seen today. Patient will see dr berry today at 2:30 pm.

## 2016-04-02 NOTE — Assessment & Plan Note (Signed)
History of hypertension blood pressure measured 145/83. He is on carvedilol. Continue current meds at current dosing

## 2016-04-02 NOTE — Telephone Encounter (Signed)
New message      Pt had a an epideral injection in his back today.  During the procedure, pt had problems and was told to call Dr Gwenlyn Found and possible be seen soon. He is home now. Please call

## 2016-04-02 NOTE — Progress Notes (Signed)
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 He was referred by Dr. Melford Aase for cardiovascular evaluation because of lower extremity edema. I last saw him in the office 03/21/15.His cardiac resector profiles are notable for treated hypertension. He smoked tobacco remotely having stopped 25-30 years ago. He's never had a heart attack or stroke and denies chest pain or shortness of breath. He did have back surgery performed by Dr. Ellene Route December of last year after which she had urinary obstruction and lower extremity edema which resolved with relief of the obstruction and additional diuretics.  He is aware of salt restriction but does admit to dietary indiscretion. His edema had significantly improved with the addition of low-dose diuretics and these were recently increased by his primary care provider. A 2-D echo was performed showed normal LV function with grade 1 diastolic dysfunction and moderate pulmonary hypertension. He does admit to orthopnea as well. A Myoview stress test performed 09/12/15 was low risk. A 2-D echo revealed normal LV systolic function with moderate pulmonary hypertension and a 48 hour monitor showed sinus rhythm with PACs.   Current Outpatient Prescriptions  Medication Sig Dispense Refill  . ALBUTEROL SULFATE HFA IN Inhale into the lungs as needed.    . ALPRAZolam (XANAX) 0.25 MG tablet Take 0.125-0.25 mg by mouth 2 (two) times daily as needed for anxiety. Reported on 08/19/2015    . atorvastatin (LIPITOR) 20 MG tablet Take 20 mg by mouth daily.    . budesonide-formoterol (SYMBICORT) 160-4.5 MCG/ACT inhaler INHALE TWO PUFFS TWICE DAILY TO PREVENT COUGH OR WHEEZE 10.2 Inhaler 5  . carvedilol  (COREG) 6.25 MG tablet Take 6.25 mg by mouth 2 (two) times daily with a meal.     . CHOLECALCIFEROL PO Take 800 Units by mouth daily. Reported on 08/15/2015    . EPINEPHrine 0.15 MG/0.15ML IJ injection Inject into the muscle.    . finasteride (PROSCAR) 5 MG tablet Take 1 tablet (5 mg total) by mouth daily. 30 tablet 5  . fluticasone (FLOVENT HFA) 220 MCG/ACT inhaler Inhale 2 puffs into the lungs 2 (two) times daily. 12 g 5  . furosemide (LASIX) 20 MG tablet Take 20 mg by mouth 2 (two) times daily.    . furosemide (LASIX) 40 MG tablet Take 1 tablet by mouth 2 (two) times daily.    . Lutein-Zeaxanthin 6-0.24 MG CAPS Take by mouth.    . montelukast (SINGULAIR) 10 MG tablet take 1 tablet by mouth at bedtime    . omeprazole (PRILOSEC) 20 MG capsule Take 20 mg by mouth daily.    . potassium chloride SA (K-DUR,KLOR-CON) 20 MEQ tablet Take 20 mEq by mouth 2 (two) times daily.    Marland Kitchen rOPINIRole (REQUIP) 1 MG tablet Take 1 tablet (1 mg total) by mouth at bedtime. 90 tablet 3  . sodium chloride (OCEAN) 0.65 % SOLN nasal spray Place 1 spray into both nostrils daily.    . tamsulosin (FLOMAX) 0.4 MG CAPS capsule   0  . traMADol (ULTRAM) 50 MG tablet Take 50 mg by mouth as needed.    . travoprost, benzalkonium, (TRAVATAN) 0.004 % ophthalmic solution 1 drop at bedtime.    . traZODone (DESYREL) 100 MG tablet Take 100 mg  by mouth at bedtime.  0  . triazolam (HALCION) 0.25 MG tablet Take 1 tablet by mouth at bedtime as needed.     No current facility-administered medications for this visit.     Allergies  Allergen Reactions  . Pollen Extract-Tree Extract Other (See Comments)    HEADACHES, TIRED , DRAINAGE FROM SINUSES  . Molds & Smuts Other (See Comments)    Also dust mites causes sinus infections, h/a etc.    Social History   Social History  . Marital status: Married    Spouse name: N/A  . Number of children: 2  . Years of education: N/A   Occupational History  . retired     Higher education careers adviser  [place] [employer]   Social History Main Topics  . Smoking status: Former Smoker    Packs/day: 1.00    Years: 35.00    Types: Pipe, Cigarettes    Quit date: 02/23/1992  . Smokeless tobacco: Never Used     Comment: quit 20 years ago  . Alcohol use Yes     Comment: 2 drinks daily scotch  AND WINE WITH SUPPER  . Drug use: No  . Sexual activity: Not on file   Other Topics Concern  . Not on file   Social History Narrative   Married - second marriage   He has two children   Retired Horticulturist, commercial Professor   Currently teaches pottery making, 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[facility]   Former Smoker quit 12 years ago- smoked for 35 years   Alcohol use- yes     Review of Systems: General: negative for chills, fever, night sweats or weight changes.  Cardiovascular: negative for chest pain, dyspnea on exertion, edema, orthopnea, palpitations, paroxysmal nocturnal dyspnea or shortness of breath Dermatological: negative for rash Respiratory: negative for cough or wheezing Urologic: negative for hematuria Abdominal: negative for nausea, vomiting, diarrhea, bright red blood per rectum, melena, or hematemesis Neurologic: negative for visual changes, syncope, or dizziness All other systems reviewed and are otherwise negative except as noted above.    Blood pressure (!) 145/83, pulse 87, height 5' 6" (1.676 m), weight 180 lb 6.4 oz (81.8 kg), SpO2 91 %.  General appearance: alert and no distress Neck: no adenopathy, no carotid bruit, no JVD, supple, symmetrical, trachea midline and thyroid not enlarged, symmetric, no tenderness/mass/nodules Lungs: clear to auscultation bilaterally Heart: regular rate and rhythm, S1, S2 normal, no murmur, click, rub or gallop Extremities: extremities normal, atraumatic, no cyanosis or edema  EKG normal sinus rhythm 88 with right bundle branch block and PACs. I personally reviewed this EKG  ASSESSMENT AND PLAN:   HYPERTENSION History of hypertension blood pressure  measured 145/83. He is on carvedilol. Continue current meds at current dosing  PALPITATIONS History of palpitations with event monitor performed in July of last year that showed sinus rhythm with PACs. Today his EKG shows sinus rhythm, right bundle-branch block with PACs as well. He did have a spinal injection earlier today at Dr. Ellene Route for back pain. There is a question of oxygen desaturation while he was getting conscious sedation. He denies shortness of breath at this time.      Lorretta Harp MD FACP,FACC,FAHA, [facility] 04/02/2016 3:18 PM

## 2016-04-02 NOTE — Assessment & Plan Note (Signed)
History of palpitations with event monitor performed in July of last year that showed sinus rhythm with PACs. Today his EKG shows sinus rhythm, right bundle-branch block with PACs as well. He did have a spinal injection earlier today at Dr. Ellene Route for back pain. There is a question of oxygen desaturation while he was getting conscious sedation. He denies shortness of breath at this time.

## 2016-04-02 NOTE — Patient Instructions (Signed)
Medication Instructions: Your physician recommends that you continue on your current medications as directed. Please refer to the Current Medication list given to you today.  Follow-Up: We request that you follow-up in: 3 months with Kerin Ransom, PA-C and in 12 months with Dr Andria Rhein will receive a reminder letter in the mail two months in advance. If you don't receive a letter, please call our office to schedule the follow-up appointment.  If you need a refill on your cardiac medications before your next appointment, please call your pharmacy.

## 2016-04-12 DIAGNOSIS — E876 Hypokalemia: Secondary | ICD-10-CM | POA: Diagnosis not present

## 2016-04-16 DIAGNOSIS — R252 Cramp and spasm: Secondary | ICD-10-CM | POA: Diagnosis not present

## 2016-04-19 DIAGNOSIS — H353131 Nonexudative age-related macular degeneration, bilateral, early dry stage: Secondary | ICD-10-CM | POA: Diagnosis not present

## 2016-04-19 DIAGNOSIS — H401131 Primary open-angle glaucoma, bilateral, mild stage: Secondary | ICD-10-CM | POA: Diagnosis not present

## 2016-04-26 DIAGNOSIS — G8929 Other chronic pain: Secondary | ICD-10-CM | POA: Diagnosis not present

## 2016-04-26 DIAGNOSIS — M5442 Lumbago with sciatica, left side: Secondary | ICD-10-CM | POA: Diagnosis not present

## 2016-04-26 DIAGNOSIS — I872 Venous insufficiency (chronic) (peripheral): Secondary | ICD-10-CM | POA: Diagnosis not present

## 2016-04-26 DIAGNOSIS — R609 Edema, unspecified: Secondary | ICD-10-CM | POA: Diagnosis not present

## 2016-04-26 DIAGNOSIS — M5441 Lumbago with sciatica, right side: Secondary | ICD-10-CM | POA: Diagnosis not present

## 2016-04-26 DIAGNOSIS — I10 Essential (primary) hypertension: Secondary | ICD-10-CM | POA: Diagnosis not present

## 2016-05-06 DIAGNOSIS — R262 Difficulty in walking, not elsewhere classified: Secondary | ICD-10-CM | POA: Diagnosis not present

## 2016-05-06 DIAGNOSIS — N3941 Urge incontinence: Secondary | ICD-10-CM | POA: Diagnosis not present

## 2016-05-06 DIAGNOSIS — M545 Low back pain: Secondary | ICD-10-CM | POA: Diagnosis not present

## 2016-05-06 DIAGNOSIS — M6281 Muscle weakness (generalized): Secondary | ICD-10-CM | POA: Diagnosis not present

## 2016-05-10 ENCOUNTER — Other Ambulatory Visit: Payer: Self-pay | Admitting: Allergy and Immunology

## 2016-05-10 DIAGNOSIS — R252 Cramp and spasm: Secondary | ICD-10-CM | POA: Diagnosis not present

## 2016-05-11 ENCOUNTER — Other Ambulatory Visit: Payer: Self-pay | Admitting: *Deleted

## 2016-05-11 ENCOUNTER — Telehealth: Payer: Self-pay | Admitting: Allergy and Immunology

## 2016-05-11 ENCOUNTER — Other Ambulatory Visit: Payer: Self-pay | Admitting: Allergy and Immunology

## 2016-05-11 NOTE — Telephone Encounter (Signed)
patient is OUT of symbicort - needs a refill called into [facility] - pharmacy sent a request yesterday??

## 2016-05-11 NOTE — Telephone Encounter (Signed)
Will fill once - needs OV.

## 2016-06-30 ENCOUNTER — Other Ambulatory Visit: Payer: Self-pay | Admitting: Allergy and Immunology

## 2016-06-30 MED ORDER — BUDESONIDE-FORMOTEROL FUMARATE 160-4.5 MCG/ACT IN AERO: INHALATION_SPRAY | RESPIRATORY_TRACT | 0 refills | Status: DC

## 2016-06-30 NOTE — Telephone Encounter (Signed)
patient needs refill on SYMBICORT called into the [facility] on [address]  Patient has upcoming appt and needs a one time courtesy refill until he is seen - please

## 2016-06-30 NOTE — Telephone Encounter (Signed)
Called patient. I informed him that I will send 1 in to get him to his appointment. I also informed him that we would not be able to give any further refills until he is seen.

## 2016-07-05 ENCOUNTER — Encounter: Payer: Self-pay | Admitting: Allergy and Immunology

## 2016-07-05 ENCOUNTER — Ambulatory Visit (INDEPENDENT_AMBULATORY_CARE_PROVIDER_SITE_OTHER): Payer: PPO | Admitting: Allergy and Immunology

## 2016-07-05 VITALS — BP 128/82 | HR 90 | Temp 98.3°F | Resp 20 | Wt 183.2 lb

## 2016-07-05 DIAGNOSIS — J4540 Moderate persistent asthma, uncomplicated: Secondary | ICD-10-CM | POA: Diagnosis not present

## 2016-07-05 DIAGNOSIS — J3089 Other allergic rhinitis: Secondary | ICD-10-CM | POA: Diagnosis not present

## 2016-07-05 DIAGNOSIS — J441 Chronic obstructive pulmonary disease with (acute) exacerbation: Secondary | ICD-10-CM

## 2016-07-05 MED ORDER — BUDESONIDE-FORMOTEROL FUMARATE 160-4.5 MCG/ACT IN AERO: 2.0000 | INHALATION_SPRAY | Freq: Two times a day (BID) | RESPIRATORY_TRACT | 5 refills | Status: DC

## 2016-07-05 NOTE — Patient Instructions (Signed)
Asthma/COPD The patient's symptoms are well controlled and his spirometry is stable on the current treatment plan.  For now, continue Symbicort 160-4.5 g, 2 inhalations via spacer device twice a day, montelukast 10 mg daily at bedtime, and albuterol HFA, 1-2 inhalations every 4-6 hours as needed.  He has a sample of Breo Ellipta from a previous visit which has not expired, he may use this medication, one inhalation daily, and place of Symbicort.  Once this sample runs out, he will resume Symbicort (as above).  Subjective and objective measures of pulmonary function will be followed and the treatment plan will be adjusted accordingly.  Allergic rhinitis with a nonallergic component Stable.  Continue appropriate allergen avoidance measures, fluticasone nasal spray as needed, as well as nasal saline spray/rinse prior to fluticasone and as needed.   Return in about 5 months (around 12/05/2016), or if symptoms worsen or fail to improve.

## 2016-07-05 NOTE — Assessment & Plan Note (Signed)
The patient's symptoms are well controlled and his spirometry is stable on the current treatment plan.  For now, continue Symbicort 160-4.5 g, 2 inhalations via spacer device twice a day, montelukast 10 mg daily at bedtime, and albuterol HFA, 1-2 inhalations every 4-6 hours as needed.  He has a sample of Breo Ellipta from a previous visit which has not expired, he may use this medication, one inhalation daily, and place of Symbicort.  Once this sample runs out, he will resume Symbicort (as above).  Subjective and objective measures of pulmonary function will be followed and the treatment plan will be adjusted accordingly.

## 2016-07-05 NOTE — Progress Notes (Signed)
Follow-up Note  RE: [name] MRN: [id number] DOB: Jul 07, 1931 Date of Office Visit: 07/05/2016  Primary care provider: Chesley Noon, MD Referring provider: Chesley Noon, MD  History of present illness: Ryan [name] is a 83 y.o. male with asthma/COPD and mixed rhinitis presenting today for follow up.  He was last seen in this clinic in July 2017.  He reports that in the interval since his previous visit his upper and lower rest or symptoms have been relatively well-controlled.  However, last week he ran out of Simcor for several days and experienced some dyspnea and chest tightness.  These lower respiratory symptoms improved once he acquired his Symbicort refill and restarted this medication.  He currently takes 2 inhalations via spacer device twice a day. He has a sample of [employer] and is wondering if he is able to use this medication to offset his co-pay cost for Symbicort.   Most recent chest xray was July 2017 revealing bibasilar subsegmental atelectasis and/or scarring particularly prominent on the left, stable as compared with multiple previous studies. He has no complaints today with the exception of occasional sinus headaches with pollen exposure.   Assessment and plan: Asthma/COPD The patient's symptoms are well controlled and his spirometry is stable on the current treatment plan.  For now, continue Symbicort 160-4.5 g, 2 inhalations via spacer device twice a day, montelukast 10 mg daily at bedtime, and albuterol HFA, 1-2 inhalations every 4-6 hours as needed.  He has a sample of Breo Ellipta from a previous visit which has not expired, he may use this medication, one inhalation daily, and place of Symbicort.  Once this sample runs out, he will resume Symbicort (as above).  Subjective and objective measures of pulmonary function will be followed and the treatment plan will be adjusted accordingly.  Allergic rhinitis with a nonallergic  component Stable.  Continue appropriate allergen avoidance measures, fluticasone nasal spray as needed, as well as nasal saline spray/rinse prior to fluticasone and as needed.   Meds ordered this encounter  Medications  . budesonide-formoterol (SYMBICORT) 160-4.5 MCG/ACT inhaler    Sig: Inhale 2 puffs into the lungs 2 (two) times daily.    Dispense:  1 Inhaler    Refill:  5    Diagnostics: Spirometry reveals an FVC of 1.88 L and an FEV1 of 1.47 L.  Moderate restriction.  FEV1 is slightly improved compared with last year's study.  Please see scanned spirometry results for details.    Physical examination: Blood pressure 128/82, pulse 90, temperature 98.3 F (36.8 C), temperature source Oral, resp. rate 20, weight 183 lb 3.2 oz (83.1 kg), SpO2 96 %.  General: Alert, interactive, in no acute distress. HEENT: TMs pearly gray, turbinates mildly edematous without discharge, post-pharynx unremarkable. Neck: Supple without lymphadenopathy. Lungs: Mildly decreased breath sounds with faint bi-basilar crackles. CV: Normal S1, S2 without murmurs. Skin: Warm and dry, without lesions or rashes.  The following portions of the patient's history were reviewed and updated as appropriate: allergies, current medications, past family history, past medical history, past social history, past surgical history and problem list.  Allergies as of 07/05/2016      Reactions   Pollen Extract-tree Extract Other (See Comments)   HEADACHES, TIRED , DRAINAGE FROM SINUSES   Molds & Smuts Other (See Comments)   Also dust mites causes sinus infections, h/a etc.      Medication List       Accurate as of 07/05/16  1:29 PM.  Always use your most recent med list.          ALBUTEROL SULFATE HFA IN Inhale into the lungs as needed.   ALPRAZolam 0.25 MG tablet Commonly known as:  XANAX Take 0.125-0.25 mg by mouth 2 (two) times daily as needed for anxiety. Reported on 08/19/2015   atorvastatin 20 MG  tablet Commonly known as:  LIPITOR Take 20 mg by mouth daily.   budesonide-formoterol 160-4.5 MCG/ACT inhaler Commonly known as:  SYMBICORT Inhale 2 puffs into the lungs 2 (two) times daily.   carvedilol 6.25 MG tablet Commonly known as:  COREG Take 6.25 mg by mouth 2 (two) times daily with a meal.   CHOLECALCIFEROL PO Take 800 Units by mouth daily. Reported on 08/15/2015   EPINEPHrine 0.15 MG/0.15ML injection Commonly known as:  EPIPEN JR Inject into the muscle.   finasteride 5 MG tablet Commonly known as:  PROSCAR Take 1 tablet (5 mg total) by mouth daily.   fluticasone 220 MCG/ACT inhaler Commonly known as:  FLOVENT HFA Inhale 2 puffs into the lungs 2 (two) times daily.   furosemide 20 MG tablet Commonly known as:  LASIX Take 20 mg by mouth 2 (two) times daily.   furosemide 40 MG tablet Commonly known as:  LASIX Take 1 tablet by mouth 2 (two) times daily.   Lutein-Zeaxanthin 6-0.24 MG Caps Take by mouth.   montelukast 10 MG tablet Commonly known as:  SINGULAIR take 1 tablet by mouth at bedtime   omeprazole 20 MG capsule Commonly known as:  PRILOSEC Take 20 mg by mouth daily.   potassium chloride SA 20 MEQ tablet Commonly known as:  K-DUR,KLOR-CON Take 20 mEq by mouth 2 (two) times daily.   rOPINIRole 1 MG tablet Commonly known as:  REQUIP Take 1 tablet (1 mg total) by mouth at bedtime.   sodium chloride 0.65 % Soln nasal spray Commonly known as:  OCEAN Place 1 spray into both nostrils daily.   tamsulosin 0.4 MG Caps capsule Commonly known as:  FLOMAX   traMADol 50 MG tablet Commonly known as:  ULTRAM Take 50 mg by mouth as needed.   travoprost (benzalkonium) 0.004 % ophthalmic solution Commonly known as:  TRAVATAN 1 drop at bedtime.   traZODone 100 MG tablet Commonly known as:  DESYREL Take 100 mg by mouth at bedtime.   triazolam 0.25 MG tablet Commonly known as:  HALCION Take 1 tablet by mouth at bedtime as needed.       Allergies   Allergen Reactions  . Pollen Extract-Tree Extract Other (See Comments)    HEADACHES, TIRED , DRAINAGE FROM SINUSES  . Molds & Smuts Other (See Comments)    Also dust mites causes sinus infections, h/a etc.    I appreciate the opportunity to take part in Travin's care. Please do not hesitate to contact me with questions.  Sincerely,   R. Edgar Frisk, MD

## 2016-07-05 NOTE — Assessment & Plan Note (Addendum)
Stable.  Continue appropriate allergen avoidance measures, fluticasone nasal spray as needed, as well as nasal saline spray/rinse prior to fluticasone and as needed.

## 2016-07-06 DIAGNOSIS — M48061 Spinal stenosis, lumbar region without neurogenic claudication: Secondary | ICD-10-CM | POA: Diagnosis not present

## 2016-07-06 DIAGNOSIS — M5136 Other intervertebral disc degeneration, lumbar region: Secondary | ICD-10-CM | POA: Diagnosis not present

## 2016-07-06 DIAGNOSIS — M419 Scoliosis, unspecified: Secondary | ICD-10-CM | POA: Diagnosis not present

## 2016-07-06 DIAGNOSIS — M4726 Other spondylosis with radiculopathy, lumbar region: Secondary | ICD-10-CM | POA: Diagnosis not present

## 2016-07-19 DIAGNOSIS — G8929 Other chronic pain: Secondary | ICD-10-CM | POA: Diagnosis not present

## 2016-07-19 DIAGNOSIS — M545 Low back pain: Secondary | ICD-10-CM | POA: Diagnosis not present

## 2016-07-19 DIAGNOSIS — I10 Essential (primary) hypertension: Secondary | ICD-10-CM | POA: Diagnosis not present

## 2016-07-19 DIAGNOSIS — R682 Dry mouth, unspecified: Secondary | ICD-10-CM | POA: Diagnosis not present

## 2016-07-19 DIAGNOSIS — G2581 Restless legs syndrome: Secondary | ICD-10-CM | POA: Diagnosis not present

## 2016-07-19 DIAGNOSIS — R609 Edema, unspecified: Secondary | ICD-10-CM | POA: Diagnosis not present

## 2016-08-09 DIAGNOSIS — H401131 Primary open-angle glaucoma, bilateral, mild stage: Secondary | ICD-10-CM | POA: Diagnosis not present

## 2016-08-30 ENCOUNTER — Ambulatory Visit: Payer: PPO | Admitting: Oncology

## 2016-10-20 DIAGNOSIS — Z23 Encounter for immunization: Secondary | ICD-10-CM | POA: Diagnosis not present

## 2016-10-20 DIAGNOSIS — I10 Essential (primary) hypertension: Secondary | ICD-10-CM | POA: Diagnosis not present

## 2016-10-20 DIAGNOSIS — R609 Edema, unspecified: Secondary | ICD-10-CM | POA: Diagnosis not present

## 2016-10-20 DIAGNOSIS — E876 Hypokalemia: Secondary | ICD-10-CM | POA: Diagnosis not present

## 2016-10-20 DIAGNOSIS — G2581 Restless legs syndrome: Secondary | ICD-10-CM | POA: Diagnosis not present

## 2016-10-22 ENCOUNTER — Telehealth: Payer: Self-pay | Admitting: Allergy and Immunology

## 2016-10-22 DIAGNOSIS — M5416 Radiculopathy, lumbar region: Secondary | ICD-10-CM | POA: Diagnosis not present

## 2016-10-22 DIAGNOSIS — M48061 Spinal stenosis, lumbar region without neurogenic claudication: Secondary | ICD-10-CM | POA: Diagnosis not present

## 2016-10-22 DIAGNOSIS — M4726 Other spondylosis with radiculopathy, lumbar region: Secondary | ICD-10-CM | POA: Diagnosis not present

## 2016-10-22 DIAGNOSIS — M5116 Intervertebral disc disorders with radiculopathy, lumbar region: Secondary | ICD-10-CM | POA: Diagnosis not present

## 2016-10-22 NOTE — Telephone Encounter (Signed)
I called the patients home number. Patient answered. I informed him that per Dr. Nelva Bush he needs to go to the emergency room and be seen. He informed me that he had his pre-op appointment this morning with Dr. Roselee Culver.  He said that his oxygen sat level was 88 at that time. He told me that they did express some concern and advised him to get that checked out. I informed him that he definitely needed to go to ED for evaluation, especially given the increase fatigue and increased confusion. He checked his oxygen sat with his own pulseox and told me the reading was 87 with a HR of 91. I stressed the importance that he go to ED. I explained to him that I understand the reluctance but we are advising hm and strongly suggesting he get evaluation and treatment at the nearest ED tonight. He said okay and thanked me for the information.

## 2016-10-22 NOTE — Telephone Encounter (Signed)
Pt's wife called the [place] office. She was concerned of the ER recommendation and wanted to speak with a CMA. I stated to the pt's wife that he is being recommended to go to the ER because of his low 02 sats with his COPD and asthma and his new sx's of fatigue and confusion. I advised to her that the lack of 02 perfusion may be the cause of his fatigue and confusion. I advised to her that he needs to be assessed by a doctor before he gets any worse. I also inquired if the confusion was abnormal from his baseline and she stated yes. She stated that he is "not incoherent, but is different than usual." She stated that she did not like going to the ER due to physicians "who do not know him" caring for him. The pt's wife is against going to the ER, unless she has to call 911. She states that she will "keep an eye on him throughout the weekend" and will call for help if needed. She primarily wants to see Dr. Verlin Fester for testing, lab work, consultation, etc. I scheduled an appt for the pt on 10/26/16 with Dr. Verlin Fester but I made sure that the pt's wife knew that the recommendation was to take the pt somewhere to be assessed before then. I also advised that if the ER was out of the question, the pt could also go to an urgent care to be looked at. I advised the pt's wife that she can keep Dr. Mariane Masters appointment on Tuesday after being seen somewhere this weekend so that he can address the current needs of the patient regarding his health status. She states that she will talk to the pt about going to the urgent care and that she appreciates the concern.   FYI Dr. Verlin Fester. Pt is on schedule for Tuesday 10/26/16.

## 2016-10-22 NOTE — Telephone Encounter (Signed)
Patient went for steroid injection in the back Oxygen level was low - and they questioned his sedation because it was so low Patient does have COPD Do they need an appt?? Does the patient need to be on oxygen?? Does he need to monitor his oxygen level for a few weeks?? Please call wife at [phone] to answer questions   FYI:: Patient has more fatigue and confusion at a noticeable level.

## 2016-10-22 NOTE — Telephone Encounter (Signed)
Called wife back. Phone line was busy. We need to know what the O2 sat was.

## 2016-10-23 ENCOUNTER — Ambulatory Visit (HOSPITAL_COMMUNITY)
Admission: EM | Admit: 2016-10-23 | Discharge: 2016-10-23 | Disposition: A | Discharge status: Home / Self Care | Payer: PPO | Attending: Family Medicine | Admitting: Family Medicine

## 2016-10-23 ENCOUNTER — Encounter (HOSPITAL_COMMUNITY): Payer: Self-pay | Admitting: Emergency Medicine

## 2016-10-23 DIAGNOSIS — J449 Chronic obstructive pulmonary disease, unspecified: Secondary | ICD-10-CM

## 2016-10-23 NOTE — ED Triage Notes (Signed)
The patient presented to the [facility] with a complaint of some shortness of breath and a decreased Sp02 over the last 2 days. The patient reported that the RN at his pulmonologist told the patient to go to an urgent care. The patient denied any pain or respiratory distress at triage.

## 2016-10-23 NOTE — Discharge Instructions (Signed)
Temp Pulse Rate BP Resp SpO2  1258 98.4 F (36.9 C) 95 128/80 18 99 %   Please keep scheduled follow up appointments.

## 2016-10-23 NOTE — ED Provider Notes (Signed)
[name]   [id number] 10/23/16 Arrival Time: 1207  ASSESSMENT & PLAN:  1. Chronic obstructive pulmonary disease, unspecified COPD type ([place])    No hypoxia today. Will keep scheduled f/u appointments with his doctors. May f/u here as needed. Reviewed expectations re: course of current medical issues. Questions answered. Outlined signs and symptoms indicating need for more acute intervention. Patient verbalized understanding. After Visit Summary given.   SUBJECTIVE:  Ryan [name] is a 83 y.o. male who presents with complaint of possible hypoxia. Has pulse ox at home and noticed it was reading 88 last evening. No SOB over his baseline. Called RN at pulmonologist who referred him here for evaluation. Feeling well today. No current respiratory difficulties. H/O COPD.  ROS: As per HPI.   OBJECTIVE:  Vitals:   10/23/16 1258  BP: 128/80  Pulse: 95  Resp: 18  Temp: 98.4 F (36.9 C)  TempSrc: Oral  SpO2: 99%    General appearance: alert; no distress Lungs: clear to auscultation bilaterally; normal respirations Heart: regular rate and rhythm Skin: warm and dry Psychological: alert and cooperative; normal mood and affect   Allergies  Allergen Reactions  . Pollen Extract-Tree Extract Other (See Comments)    HEADACHES, TIRED , DRAINAGE FROM SINUSES  . Molds & Smuts Other (See Comments)    Also dust mites causes sinus infections, h/a etc.    Past Medical History:  Diagnosis Date  . Abdominal pain    Bloating and gas  . Asthma   . Atelectasis, bilateral 02/10/11   Noted On CT Scan - Mild Dependent Atelectasis at the Lung Bases  . Barrett's esophagus   . Benign prostatic hypertrophy   . Bilateral lower extremity edema   . Chronic fatigue   . Congestion of throat    mouth breath at night, dry mouth  . Diverticulosis of colon (without mention of hemorrhage)   . Dysrhythmia    SKIPPED HEART BEAT SOMETIMES  . Environmental allergies   . Esophageal reflux     . Gastritis   . Gluten intolerance   . Hiatal hernia 02/10/11   Noted on CT Scan - Moderate Hiatal Hernia  . Hyperlipemia   . Hypertension   . Hypertension   . Lactose intolerance   . 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 Melanoma ([place])    lymphoma  . Microcytic anemia 04/20/2011   . 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 Osteoarthritis    hands/feet,knees, NECK, BACK  . Pain    LOWER BACK PAIN - DDD, SCOLIOSIS, BONE SPUR.  FINISHED THE 3RD EPIDURAL STEROID INJECTION 7/15 - DONE BY DR. Nelva Bush. - STILL HAVING BACK PAIN  . Periaortic lymphadenopathy 02/16/2011  . Personal history of colonic polyps 2004   hyperplastic Dr. Collene Mares  . Renal cyst 02/10/11    Noted on CT Scan - Bilateral Renal Cysts  . S/P radiation therapy 03/15/11 - 04/09/11   Abdominal/ Pelvic Tumor, 3600 cGy/20 Fractions  . Sleep disorder    DOES NOT SLEEP WELL   Social History   Social History  . Marital status: Married    Spouse name: N/A  . Number of children: 2  . Years of education: N/A   Occupational History  . 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 Smoking status: Former Smoker    Packs/day: 1.00  Years: 35.00    Types: Pipe, Cigarettes    Quit date: 02/23/1992  . Smokeless tobacco: Never Used     Comment: quit 20 years ago  . Alcohol use Yes     Comment: 2 drinks daily scotch  AND WINE WITH SUPPER  . Drug use: No  . Sexual activity: Not on file   Other Topics Concern  . Not on file   Social History Narrative   Married - second marriage   He has two children   Retired Horticulturist, commercial Professor   Currently teaches pottery making, 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[facility]   Former Smoker quit 12 years ago- smoked for 35 years   Alcohol use- yes   Family History  Problem Relation Age of Onset  . Heart disease Father        MI 30  . Urticaria Father   .  Prostate cancer Brother   . Prostate cancer Paternal Uncle   . Prostate cancer Paternal Uncle   . Prostate cancer Paternal Uncle   . Hyperlipidemia Unknown   . Stroke Unknown   . Hypertension Unknown   . ADD / ADHD Unknown   . Colon cancer Neg Hx   . Allergic rhinitis Neg Hx   . Asthma Neg Hx   . Eczema Neg Hx    Past Surgical History:  Procedure Laterality Date  . ARTHROSCOPIC REPAIR ACL     right  . BONE MARROW ASPIRATION  02/25/11   Bone Marrow, Aspirate, Clot, and Bilateral Bx, Right PIC  . CATARACT EXTRACTION, BILATERAL    . EYE SURGERY    . HERNIA REPAIR     LEFT INGUINAL   . INSERTION OF MESH N/A 08/23/2012   Procedure: INSERTION OF MESH;  Surgeon: Madilyn Hook, DO;  Location: [facility];  Service: General;  Laterality: N/A;  . LUMBAR LAMINECTOMY/DECOMPRESSION MICRODISCECTOMY Right 12/31/2013   Procedure: RIGHT L4-5 L5-S1 LAMINECTOMY;  Surgeon: Kristeen Miss, MD;  Location: [facility];  Service: Neurosurgery;  Laterality: Right;  RIGHT L4-5 L5-S1 LAMINECTOMY  . ROTATOR CUFF REPAIR     right  . TONSILLECTOMY    . TONSILLECTOMY    . VENTRAL HERNIA REPAIR N/A 08/23/2012   Procedure: HERNIA REPAIR VENTRAL ADULT;  Surgeon: Madilyn Hook, DO;  Location: [facility];  Service: General;  Laterality: Ginette Pitman, MD 10/23/16 1315

## 2016-10-25 NOTE — Telephone Encounter (Signed)
Noted.

## 2016-10-26 ENCOUNTER — Encounter: Payer: Self-pay | Admitting: Allergy and Immunology

## 2016-10-26 ENCOUNTER — Ambulatory Visit
Admission: RE | Admit: 2016-10-26 | Discharge: 2016-10-26 | Disposition: A | Discharge status: Home / Self Care | Payer: PPO | Source: Ambulatory Visit | Attending: Allergy and Immunology | Admitting: Allergy and Immunology

## 2016-10-26 ENCOUNTER — Ambulatory Visit (INDEPENDENT_AMBULATORY_CARE_PROVIDER_SITE_OTHER): Payer: PPO | Admitting: Allergy and Immunology

## 2016-10-26 VITALS — BP 122/74 | HR 88 | Resp 18

## 2016-10-26 DIAGNOSIS — J449 Chronic obstructive pulmonary disease, unspecified: Secondary | ICD-10-CM | POA: Diagnosis not present

## 2016-10-26 DIAGNOSIS — J4541 Moderate persistent asthma with (acute) exacerbation: Secondary | ICD-10-CM | POA: Diagnosis not present

## 2016-10-26 DIAGNOSIS — R0902 Hypoxemia: Secondary | ICD-10-CM | POA: Diagnosis not present

## 2016-10-26 DIAGNOSIS — J441 Chronic obstructive pulmonary disease with (acute) exacerbation: Secondary | ICD-10-CM

## 2016-10-26 DIAGNOSIS — H538 Other visual disturbances: Secondary | ICD-10-CM | POA: Diagnosis not present

## 2016-10-26 DIAGNOSIS — J3089 Other allergic rhinitis: Secondary | ICD-10-CM

## 2016-10-26 MED ORDER — FLUTICASONE-UMECLIDIN-VILANT 100-62.5-25 MCG/INH IN AEPB: 1.0000 | INHALATION_SPRAY | Freq: Every day | RESPIRATORY_TRACT | 3 refills | Status: DC

## 2016-10-26 NOTE — Assessment & Plan Note (Addendum)
Pulse ox 83% with ambulation on room air today.   An order will be provided for supplemental oxygen for ambulation, exertion, and nocturnal use.  A referral will be made to pulmonology for further evaluation.

## 2016-10-26 NOTE — Assessment & Plan Note (Signed)
Stable.  Continue appropriate allergen avoidance measures, fluticasone nasal spray as needed, as well as nasal saline spray/rinse as needed.

## 2016-10-26 NOTE — Progress Notes (Signed)
Follow-up Note  RE: Ryan [name] MRN: [id number] DOB: [birth year] Date of Office Visit: 10/26/2016  Primary care provider: Chesley Noon, MD Referring provider: Chesley Noon, MD  History of present illness: Ryan [name] is a 83 y.o. male with asthma/COPD and allergic rhinitis presenting today for a sick visit.  He is accompanied today by his wife who assists with the history.  Apparently, last week he went in for an epidural and incidentally his O2 sats were found to be in the 80s.  He was given supplemental oxygen prior to the epidural and told to follow up with his primary care physician.  Over the next day or 2, he began to experience fatigue and possible confusion.  He went to the urgent care for assessment and his O2 saturation was 99% while at rest.  He brought a pulse oximeter for home use and has been monitoring his O2 sats which have ranged between 93-95 at rest and down to 88 or 89 with exertion.  Today, his O2 sat was 94% at rest and dropped down to 83% with a short, slow walk on level surface.  He has experienced some mild chest tightness over the past week, however denies overt wheezing.  He has no nasal or sinus symptom complaints today.  He also complains of occasional blurry vision.The blurred vision does not seem to correlate with low oxygen saturation.  He also notes that for many years he has had an irregular heartbeat which has been "checked out many times" and he states that he has been assured that this is not atrial fibrillation.   Assessment and plan: Asthma/COPD  A samples and a prescription have been provided for Trelegy Ellipta, one inhalation daily.    Discontinue Symbicort.  Continue albuterol HFA, 1 - 2 inhalations every 4-6 hours as needed.  Hypoxemia Pulse ox 83% with ambulation on room air today.   An order will be provided for supplemental oxygen for ambulation, exertion, and nocturnal use.  A referral will be made to pulmonology for further  evaluation.  Allergic rhinitis with a nonallergic component Stable.  Continue appropriate allergen avoidance measures, fluticasone nasal spray as needed, as well as nasal saline spray/rinse as needed.  Blurry vision, bilateral  The patient is scheduled for follow up with his ophthalmologist in the near future.   Meds ordered this encounter  Medications  . Fluticasone-Umeclidin-Vilant (TRELEGY ELLIPTA) 100-62.5-25 MCG/INH AEPB    Sig: Inhale 1 puff into the lungs daily.    Dispense:  28 each    Refill:  3    Diagnostics: Spirometry reveals an FVC of 2.34 L (see 79% predicted) and an FEV1 of 1.35 L (60% predicted) with an FEV1 ratio of 73%.  There was 100 mL (8%) postbronchodilator improvement.  This study was performed while the patient was asymptomatic.  Please see scanned spirometry results for details. Chest x-ray revealed bibasilar atelectasis without acute changes.    Physical examination: Blood pressure 122/74, pulse 88, resp. rate 18, SpO2 (!) 83 %.  General: Alert, interactive, in no acute distress. HEENT: TMs pearly gray, turbinates mildly edematous without discharge, post-pharynx mildly erythematous. Neck: Supple without lymphadenopathy. Lungs: Mildly decreased breath sounds bilaterally without wheezing, rhonchi or rales. CV: Irregular without murmurs. Skin: Warm and dry, without lesions or rashes.  The following portions of the patient's history were reviewed and updated as appropriate: allergies, current medications, past family history, past medical history, past social history, past surgical history and problem list.  Allergies  as of 10/26/2016      Reactions   Pollen Extract-tree Extract Other (See Comments)   HEADACHES, TIRED , DRAINAGE FROM SINUSES   Molds & Smuts Other (See Comments)   Also dust mites causes sinus infections, h/a etc.      Medication List       Accurate as of 10/26/16  6:32 PM. Always use your most recent med list.            ALBUTEROL SULFATE HFA IN Inhale into the lungs as needed.   ALPRAZolam 0.25 MG tablet Commonly known as:  XANAX Take 0.125-0.25 mg by mouth 2 (two) times daily as needed for anxiety. Reported on 08/19/2015   atorvastatin 20 MG tablet Commonly known as:  LIPITOR Take 20 mg by mouth daily.   budesonide-formoterol 160-4.5 MCG/ACT inhaler Commonly known as:  SYMBICORT Inhale 2 puffs into the lungs 2 (two) times daily.   carvedilol 6.25 MG tablet Commonly known as:  COREG Take 6.25 mg by mouth 2 (two) times daily with a meal.   CHOLECALCIFEROL PO Take 800 Units by mouth daily. Reported on 08/15/2015   EPINEPHrine 0.15 MG/0.15ML injection Commonly known as:  EPIPEN JR Inject into the muscle.   finasteride 5 MG tablet Commonly known as:  PROSCAR Take 1 tablet (5 mg total) by mouth daily.   Fluticasone-Umeclidin-Vilant 100-62.5-25 MCG/INH Aepb Commonly known as:  TRELEGY ELLIPTA Inhale 1 puff into the lungs daily.   furosemide 20 MG tablet Commonly known as:  LASIX Take 40 mg by mouth 2 (two) times daily.   Lutein-Zeaxanthin 6-0.24 MG Caps Take by mouth.   montelukast 10 MG tablet Commonly known as:  SINGULAIR take 1 tablet by mouth at bedtime   omeprazole 20 MG capsule Commonly known as:  PRILOSEC Take 20 mg by mouth daily.   potassium chloride SA 20 MEQ tablet Commonly known as:  K-DUR,KLOR-CON Take 20 mEq by mouth 2 (two) times daily.   rOPINIRole 1 MG tablet Commonly known as:  REQUIP Take 1 tablet (1 mg total) by mouth at bedtime.   sodium chloride 0.65 % Soln nasal spray Commonly known as:  OCEAN Place 1 spray into both nostrils daily.   tamsulosin 0.4 MG Caps capsule Commonly known as:  FLOMAX   traMADol 50 MG tablet Commonly known as:  ULTRAM Take 50 mg by mouth as needed.   travoprost (benzalkonium) 0.004 % ophthalmic solution Commonly known as:  TRAVATAN 1 drop at bedtime.   traZODone 100 MG tablet Commonly known as:  DESYREL Take 100 mg by  mouth at bedtime.   triazolam 0.25 MG tablet Commonly known as:  HALCION Take 1 tablet by mouth at bedtime as needed.            Discharge Care Instructions        Start     Ordered   10/26/16 0000  Spirometry with Graph    Question Answer Comment  Where should this test be performed? Other   Basic spirometry No   Spirometry pre & post bronchodilator Yes      10/26/16 1315   10/26/16 0000  Fluticasone-Umeclidin-Vilant (TRELEGY ELLIPTA) 100-62.5-25 MCG/INH AEPB  Daily     10/26/16 1315   10/26/16 0000  DG Chest 2 View    Question Answer Comment  Reason for Exam (SYMPTOM  OR DIAGNOSIS REQUIRED) COPD and hypoxia   Preferred imaging location? [facility]   Radiology Contrast Protocol - do NOT remove file path [email or web address]  10/26/16 1315      Allergies  Allergen Reactions  . Pollen Extract-Tree Extract Other (See Comments)    HEADACHES, TIRED , DRAINAGE FROM SINUSES  . Molds & Smuts Other (See Comments)    Also dust mites causes sinus infections, h/a etc.   Review of systems: Review of systems negative except as noted in HPI / PMHx or noted below: Constitutional: Negative.  HENT: Negative.   Eyes: Negative.  Respiratory: Negative.   Cardiovascular: Negative.  Gastrointestinal: Negative.  Genitourinary: Negative.  Musculoskeletal: Negative.  Neurological: Negative.  Endo/Heme/Allergies: Negative.  Cutaneous: Negative.  Past Medical History:  Diagnosis Date  . Abdominal pain    Bloating and gas  . Asthma   . Atelectasis, bilateral 02/10/11   Noted On CT Scan - Mild Dependent Atelectasis at the Lung Bases  . Barrett's esophagus   . Benign prostatic hypertrophy   . Bilateral lower extremity edema   . Chronic fatigue   . Congestion of throat    mouth breath at night, dry mouth  . Diverticulosis of colon (without mention of hemorrhage)   . Dysrhythmia    SKIPPED HEART BEAT SOMETIMES  . Environmental  allergies   . Esophageal reflux   . Gastritis   . Gluten intolerance   . Hiatal hernia 02/10/11   Noted on CT Scan - Moderate Hiatal Hernia  . Hyperlipemia   . Hypertension   . Hypertension   . Lactose intolerance   . Lymphoma of lymph nodes in pelvis ([place]) 03/03/2011    Large Right Retroperitoneal Mass  . Melanoma ([place])    lymphoma  . Microcytic anemia 04/20/2011   . 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 Osteoarthritis    hands/feet,knees, NECK, BACK  . Pain    LOWER BACK PAIN - DDD, SCOLIOSIS, BONE SPUR.  FINISHED THE 3RD EPIDURAL STEROID INJECTION 7/15 - DONE BY DR. Nelva Bush. - STILL HAVING BACK PAIN  . Periaortic lymphadenopathy 02/16/2011  . Personal history of colonic polyps 2004   hyperplastic Dr. Collene Mares  . Renal cyst 02/10/11    Noted on CT Scan - Bilateral Renal Cysts  . S/P radiation therapy 03/15/11 - 04/09/11   Abdominal/ Pelvic Tumor, 3600 cGy/20 Fractions  . Sleep disorder    DOES NOT SLEEP WELL    Family History  Problem Relation Age of Onset  . Heart disease Father        MI 33  . Urticaria Father   . Prostate cancer Brother   . Prostate cancer Paternal Uncle   . Prostate cancer Paternal Uncle   . Prostate cancer Paternal Uncle   . Hyperlipidemia Unknown   . Stroke Unknown   . Hypertension Unknown   . ADD / ADHD Unknown   . Colon cancer Neg Hx   . Allergic rhinitis Neg Hx   . Asthma Neg Hx   . Eczema Neg Hx     Social History   Social History  . Marital status: Married    Spouse name: N/A  . Number of children: 2  . Years of education: N/A   Occupational History  . 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 Smoking status: Former Smoker    Packs/day: 1.00    Years: 35.00    Types: Pipe, Cigarettes    Quit date: 02/23/1992  . Smokeless tobacco: Never Used     Comment: quit 20 years ago  .  Alcohol use Yes     Comment: 2 drinks daily scotch  AND WINE WITH  SUPPER  . Drug use: No  . Sexual activity: Not on file   Other Topics Concern  . Not on file   Social History Narrative   Married - second marriage   He has two children   Retired Horticulturist, commercial Professor   Currently teaches pottery making, 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[facility]   Former Smoker quit 12 years ago- smoked for 35 years   Alcohol use- yes    I appreciate the opportunity to take part in [place] care. Please do not hesitate to contact me with questions.  Sincerely,   R. Edgar Frisk, MD

## 2016-10-26 NOTE — Assessment & Plan Note (Addendum)
   A samples and a prescription have been provided for Trelegy Ellipta, one inhalation daily.    Discontinue Symbicort.  Continue albuterol HFA, 1 - 2 inhalations every 4-6 hours as needed.

## 2016-10-26 NOTE — Patient Instructions (Addendum)
Asthma/COPD  A samples and a prescription have been provided for Trelegy Ellipta, one inhalation daily.    Discontinue Symbicort.  Continue albuterol HFA, 1 - 2 inhalations every 4-6 hours as needed.  Hypoxemia Pulse ox 83% with ambulation on room air today.   An order will be provided for supplemental oxygen for ambulation, exertion, and nocturnal use.  A referral will be made to pulmonology for further evaluation.  Allergic rhinitis with a nonallergic component Stable.  Continue appropriate allergen avoidance measures, fluticasone nasal spray as needed, as well as nasal saline spray/rinse as needed.  Blurry vision, bilateral  The patient is scheduled for follow up with his ophthalmologist in the near future.

## 2016-10-26 NOTE — Assessment & Plan Note (Signed)
   The patient is scheduled for follow up with his ophthalmologist in the near future. 

## 2016-10-26 NOTE — Assessment & Plan Note (Deleted)
   The patient is scheduled for follow up with his ophthalmologist in the near future.

## 2016-10-26 NOTE — Assessment & Plan Note (Deleted)
Stable.  Continue appropriate allergen avoidance measures, fluticasone nasal spray as needed, as well as nasal saline spray/rinse as needed.

## 2016-10-26 NOTE — Assessment & Plan Note (Deleted)
   The patient is scheduled for follow up with his ophthalmologist in the near future. 

## 2016-10-27 ENCOUNTER — Telehealth: Payer: Self-pay | Admitting: Allergy and Immunology

## 2016-10-27 NOTE — Telephone Encounter (Signed)
-----  Message from Adelina Mings, MD sent at 10/26/2016  3:13 PM EDT ----- Patient needs a referral for pulmonology but please contact his PCP (Dr. Melford Aase) to find out who Dr. Melford Aase would like for him to see. Thanks

## 2016-10-27 NOTE — Telephone Encounter (Signed)
Spoke to the patient - does not have a current pulmonary doctor Spoke to PCP office - they refer to [place] - set up an appt for the patient 10/29/2016 @ 1:30 with Dr Christinia Gully for Hypoxemia Spoke to the patient and advised of the appt

## 2016-10-27 NOTE — Telephone Encounter (Signed)
Noted.

## 2016-10-29 ENCOUNTER — Institutional Professional Consult (permissible substitution): Payer: PPO | Admitting: Internal Medicine

## 2016-10-29 DIAGNOSIS — J449 Chronic obstructive pulmonary disease, unspecified: Secondary | ICD-10-CM | POA: Diagnosis not present

## 2016-11-01 ENCOUNTER — Ambulatory Visit (INDEPENDENT_AMBULATORY_CARE_PROVIDER_SITE_OTHER): Payer: PPO | Admitting: Pulmonary Disease

## 2016-11-01 ENCOUNTER — Encounter: Payer: Self-pay | Admitting: Pulmonary Disease

## 2016-11-01 DIAGNOSIS — R609 Edema, unspecified: Secondary | ICD-10-CM

## 2016-11-01 DIAGNOSIS — R0902 Hypoxemia: Secondary | ICD-10-CM

## 2016-11-01 DIAGNOSIS — J441 Chronic obstructive pulmonary disease with (acute) exacerbation: Secondary | ICD-10-CM | POA: Diagnosis not present

## 2016-11-01 DIAGNOSIS — R0602 Shortness of breath: Secondary | ICD-10-CM

## 2016-11-01 NOTE — Assessment & Plan Note (Signed)
Stay on Lasix 40 mg twice daily Weight yourself at least 3 times a week

## 2016-11-01 NOTE — Patient Instructions (Signed)
  We will advise you about use of oxygen.  you have moderate COPD-lung capacity is around 65%  Check with her insurance if Trelegy is covered and we can call in a prescription if needed  Schedule high-resolution CT scan of the chest to look for scar tissue in the lungs Stay on Lasix 40 mg twice daily Weight yourself at least 3 times a week

## 2016-11-01 NOTE — Assessment & Plan Note (Signed)
He does seem to have moderate COPD based on his spirometry from 10/2016 with moderate airway obstruction.  Check with insurance if Trelegy is covered and we can call in a prescription if needed If not he can continue with his Symbicort  Not clear why this has worsened over the past couple of weeks to the point of requiring oxygen, lung function does not correlate. Very likely related to second issue which may be fluid overload or perhaps a clinical aspiration related large hiatal hernia-although he was asymptomatic within the last week  Schedule high-resolution CT scan of the chest to look for poorly fibrosis given bibasal crackles on exam

## 2016-11-01 NOTE — Progress Notes (Signed)
Subjective:    Patient ID: Ryan [name], male    DOB: Jun 26, 1931, 83 y.o.   MRN: [id number]  HPI  Chief Complaint  Patient presents with  . Pulm Consult    Referred by Dr. Verlin Fester for low oxygen levels. States O2 when he checks at home is between 90-93%. Denies any SOB or chest tightness.      83 year old remote smoker presents for evaluation of hypoxia . He smoked about half pack per day starting with a pipe and then moved on to cigarettes, less than 30-pack-years before he quit in 1994 He was told by his allergist more than 2 years ago that he had COPD. He has been maintained on Symbicort. He was noted to have low oxygen saturations around 88% when he went into his epidural. Due to this he had a visit with urgent care and was noted to have normal oxygen saturation. He then had a visit with his allergist and is found to desaturate to 83% on walking. Oxygen was prescribed, delivered by [facility] and he has used this for the last 48 hours. He was then referred to [place] for further evaluation.  He was also given a sample of trelegy. He denies having a chest cold around this time. He has been having increasing bipedal edema for the last 2 months, and has recently increased taking his Lasix to 40 twice a day. His main complaint is lack of energy . He had an extensive cardiac workup which was nonrevealing. He has a large hiatal hernia noted on his prior imaging studies. He has a lymphoma survivor  He now lives in [place] and his wife lives independently. He denies frequent chest colds or wheezing. His immunizations are up-to-date  He walked 3 left from the office and desaturated to 89% and his heart rate remained in the 90s and recovered quickly  Significant tests/ events reviewed  Spirometry 10/2011 showed moderate airway obstruction with a ratio of 67 and FEV1 of 64% Reviewed all his other prior spirometry is which showed poor effort and show restriction rather than  obstruction CT chest 01/2015 large hiatal hernia and small focus of consolidation right lower lobe   Gated EF 09/2015 normal     Past Medical History:  Diagnosis Date  . Abdominal pain    Bloating and gas  . Asthma   . Atelectasis, bilateral 02/10/11   Noted On CT Scan - Mild Dependent Atelectasis at the Lung Bases  . Barrett's esophagus   . Benign prostatic hypertrophy   . Bilateral lower extremity edema   . Chronic fatigue   . Congestion of throat    mouth breath at night, dry mouth  . Diverticulosis of colon (without mention of hemorrhage)   . Dysrhythmia    SKIPPED HEART BEAT SOMETIMES  . Environmental allergies   . Esophageal reflux   . Gastritis   . Gluten intolerance   . Hiatal hernia 02/10/11   Noted on CT Scan - Moderate Hiatal Hernia  . Hyperlipemia   . Hypertension   . Hypertension   . Lactose intolerance   . 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 Melanoma ([place])    lymphoma  . Microcytic anemia 04/20/2011   . 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 Osteoarthritis    hands/feet,knees, NECK, BACK  . Pain    LOWER BACK PAIN - DDD, SCOLIOSIS, BONE  SPUR.  FINISHED THE 3RD EPIDURAL STEROID INJECTION 7/15 - DONE BY DR. Nelva Bush. - STILL HAVING BACK PAIN  . Periaortic lymphadenopathy 02/16/2011  . Personal history of colonic polyps 2004   hyperplastic Dr. Collene Mares  . Renal cyst 02/10/11    Noted on CT Scan - Bilateral Renal Cysts  . S/P radiation therapy 03/15/11 - 04/09/11   Abdominal/ Pelvic Tumor, 3600 cGy/20 Fractions  . Sleep disorder    DOES NOT SLEEP WELL   Past Surgical History:  Procedure Laterality Date  . ARTHROSCOPIC REPAIR ACL     right  . BONE MARROW ASPIRATION  02/25/11   Bone Marrow, Aspirate, Clot, and Bilateral Bx, Right PIC  . CATARACT EXTRACTION, BILATERAL    . EYE SURGERY    . HERNIA REPAIR     LEFT INGUINAL   . INSERTION OF MESH N/A  08/23/2012   Procedure: INSERTION OF MESH;  Surgeon: Madilyn Hook, DO;  Location: [facility];  Service: General;  Laterality: N/A;  . LUMBAR LAMINECTOMY/DECOMPRESSION MICRODISCECTOMY Right 12/31/2013   Procedure: RIGHT L4-5 L5-S1 LAMINECTOMY;  Surgeon: Kristeen Miss, MD;  Location: [facility];  Service: Neurosurgery;  Laterality: Right;  RIGHT L4-5 L5-S1 LAMINECTOMY  . ROTATOR CUFF REPAIR     right  . TONSILLECTOMY    . TONSILLECTOMY    . VENTRAL HERNIA REPAIR N/A 08/23/2012   Procedure: HERNIA REPAIR VENTRAL ADULT;  Surgeon: Madilyn Hook, DO;  Location: [facility];  Service: General;  Laterality: N/A;    Allergies  Allergen Reactions  . Pollen Extract-Tree Extract Other (See Comments)    HEADACHES, TIRED , DRAINAGE FROM SINUSES  . Molds & Smuts Other (See Comments)    Also dust mites causes sinus infections, h/a etc.       Social History   Social History  . Marital status: Married    Spouse name: N/A  . Number of children: 2  . Years of education: N/A   Occupational History  . 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 Smoking status: Former Smoker    Packs/day: 1.00    Years: 35.00    Types: Pipe, Cigarettes    Quit date: 02/23/1992  . Smokeless tobacco: Never Used     Comment: quit 20 years ago  . Alcohol use Yes     Comment: 2 drinks daily scotch  AND WINE WITH SUPPER  . Drug use: No  . Sexual activity: Not on file   Other Topics Concern  . Not on file   Social History Narrative   Married - second marriage   He has two children   Retired Horticulturist, commercial Professor   Currently teaches pottery making, 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[facility]   Former Smoker quit 12 years ago- smoked for 35 years   Alcohol use- yes       Family History  Problem Relation Age of Onset  . Heart disease Father        MI 89  . Urticaria Father   . Prostate cancer Brother   . Prostate cancer Paternal Uncle   . Prostate cancer Paternal Uncle   . Prostate  cancer Paternal Uncle   . Hyperlipidemia Unknown   . Stroke Unknown   . Hypertension Unknown   . ADD / ADHD Unknown   . Colon cancer Neg Hx   . Allergic rhinitis Neg Hx   . Asthma Neg Hx   . Eczema Neg Hx  Review of Systems Positive for bilateral lower extremity edema, chronic lack of energy and dyspnea and exertion and poor sleep habits  Constitutional: negative for anorexia, fevers and sweats  Eyes: negative for irritation, redness and visual disturbance  Ears, nose, mouth, throat, and face: negative for earaches, epistaxis, nasal congestion and sore throat  Respiratory: negative for cough, dyspnea on exertion, sputum and wheezing  Cardiovascular: negative for chest pain, dyspnea, \ orthopnea, palpitations and syncope  Gastrointestinal: negative for abdominal pain, constipation, diarrhea, melena, nausea and vomiting  Genitourinary:negative for dysuria, frequency and hematuria  Hematologic/lymphatic: negative for bleeding, easy bruising and lymphadenopathy  Musculoskeletal:negative for arthralgias, muscle weakness and stiff joints  Neurological: negative for coordination problems, gait problems, headaches and weakness  Endocrine: negative for diabetic symptoms including polydipsia, polyuria and weight loss     Objective:   Physical Exam   Gen. Pleasant, well-nourished, in no distress, normal affect, ambulates with a cane ENT - no lesions, no post nasal drip Neck: No JVD, no thyromegaly, no carotid bruits Lungs: no use of accessory muscles, no dullness to percussion, bibasal crackles,no rhonchi  Cardiovascular: Rhythm regular, heart sounds  normal, no murmurs or gallops, 2+ peripheral edema Abdomen: soft and non-tender, no hepatosplenomegaly, BS normal. Musculoskeletal: No deformities, no cyanosis or clubbing Neuro:  alert, non focal        Assessment & Plan:

## 2016-11-01 NOTE — Assessment & Plan Note (Signed)
He does desaturate on exertion but not to the point where oxygen would help. I have asked him to continue using nocturnal oxygen and uses oxygen on exertion only we'll give this a trial for a few weeks and then reassess  Meanwhile we will diurese him and see if his condition improves

## 2016-11-02 DIAGNOSIS — H401131 Primary open-angle glaucoma, bilateral, mild stage: Secondary | ICD-10-CM | POA: Diagnosis not present

## 2016-11-04 DIAGNOSIS — R252 Cramp and spasm: Secondary | ICD-10-CM | POA: Diagnosis not present

## 2016-11-04 DIAGNOSIS — R609 Edema, unspecified: Secondary | ICD-10-CM | POA: Diagnosis not present

## 2016-11-16 ENCOUNTER — Ambulatory Visit (INDEPENDENT_AMBULATORY_CARE_PROVIDER_SITE_OTHER)
Admission: RE | Admit: 2016-11-16 | Discharge: 2016-11-16 | Disposition: A | Discharge status: Home / Self Care | Payer: PPO | Source: Ambulatory Visit | Attending: Pulmonary Disease | Admitting: Pulmonary Disease

## 2016-11-16 DIAGNOSIS — R0602 Shortness of breath: Secondary | ICD-10-CM

## 2016-11-16 DIAGNOSIS — J8410 Pulmonary fibrosis, unspecified: Secondary | ICD-10-CM | POA: Diagnosis not present

## 2016-11-22 ENCOUNTER — Encounter: Payer: Self-pay | Admitting: Adult Health

## 2016-11-22 ENCOUNTER — Ambulatory Visit (INDEPENDENT_AMBULATORY_CARE_PROVIDER_SITE_OTHER): Payer: PPO | Admitting: Adult Health

## 2016-11-22 DIAGNOSIS — J8410 Pulmonary fibrosis, unspecified: Secondary | ICD-10-CM

## 2016-11-22 DIAGNOSIS — J441 Chronic obstructive pulmonary disease with (acute) exacerbation: Secondary | ICD-10-CM

## 2016-11-22 MED ORDER — FLUTICASONE-UMECLIDIN-VILANT 100-62.5-25 MCG/INH IN AEPB: 1.0000 | INHALATION_SPRAY | Freq: Every day | RESPIRATORY_TRACT | 0 refills | Status: DC

## 2016-11-22 NOTE — Addendum Note (Signed)
Addended by: Parke Poisson E on: 11/22/2016 12:43 PM   Modules accepted: Orders

## 2016-11-22 NOTE — Patient Instructions (Addendum)
Continue on TRELEGY 1 puff daily. Rinse after.  Activity as tolerated.  Check to see if you have gotten Prevnar 13 Vaccine .  Follow up with Dr. Elsworth Soho  In 3 months and As needed

## 2016-11-22 NOTE — Assessment & Plan Note (Signed)
Improved symptom control on TRELEGY  Check on prevnar vaccine    Check PFT on return   Plan  Patient Instructions  Continue on TRELEGY 1 puff daily. Rinse after.  Activity as tolerated.  Check to see if you have gotten Prevnar 13 Vaccine .  Follow up with Dr. Elsworth Soho  In 3 months and As needed

## 2016-11-22 NOTE — Assessment & Plan Note (Signed)
Very mild fibrosis in lung bases R>L w/ minimal progression since 2016. Has not significant cough . Mild desats with ambulation but no severe enough for O2. He does have some Pulmonary HTN on prev. Echo . Says previous sleep study neg for OSA . Unable to find results.  For now continue to montior  Check PFT with [facility] on return  prevnar vaccine if not had  - he will check with PCP .

## 2016-11-22 NOTE — Progress Notes (Signed)
_0  ID: Ryan [name], male    DOB: 01-21-1932, 83 y.o.   MRN: [id number]  Chief Complaint  Patient presents with  . 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    TEST /Events   Spirometry 10/2011 showed moderate airway obstruction with a ratio of 67 and FEV1 of 64% Reviewed all his other prior spirometry is which showed poor effort and show restriction rather than obstruction CT chest 01/2015 large hiatal hernia and small focus of consolidation right lower lobe Spirometry 10/26/2016 FEV1 at 60%, ratio 57 , FVC 79%.   Gated EF 09/2015 normal  08/2015 Echo GR 1 DD , PAP 75m Hg , EF nml   11/22/2016 Follow up : COPD  Patient returns for a two-week follow-up. Patient was seen 2 weeks ago for pulmonary consult. Patient had been noted to have some hypoxemia with walking. He also carried a diagnosis of COPD. Patient was recommended to begin TRELEGY . He says he does think that this has helped his breathing and feels like he may have some more energy. Patient's main complaint is fatigue and lack of energy. Pt was set up for  HRCT chest that showed very mild fibrosis in the lung bases, especially in the right lower lobe felt to have minimal progression since 2016. Large hiatal hernia.  Patient denies any cough, shortness of breath, chest pain. Patient does have chronic lower extremity swelling.       Allergies  Allergen Reactions  . Pollen Extract-Tree Extract Other (See Comments)    HEADACHES, TIRED , DRAINAGE FROM SINUSES  . Molds & Smuts Other (See Comments)    Also dust mites causes sinus infections, h/a etc.    Immunization History  Administered Date(s) Administered  . Influenza Whole 12/19/2006, 11/17/2007, 01/13/2009  . Influenza, High Dose Seasonal PF 10/20/2016  . Influenza-Unspecified 10/22/2011, 11/27/2012  . Pneumococcal  Polysaccharide-23 01/12/2007  . Td 05/20/2008  . Zoster 06/20/2007    Past Medical History:  Diagnosis Date  . Abdominal pain    Bloating and gas  . Asthma   . Atelectasis, bilateral 02/10/11   Noted On CT Scan - Mild Dependent Atelectasis at the Lung Bases  . Barrett's esophagus   . Benign prostatic hypertrophy   . Bilateral lower extremity edema   . Chronic fatigue   . Congestion of throat    mouth breath at night, dry mouth  . Diverticulosis of colon (without mention of hemorrhage)   . Dysrhythmia    SKIPPED HEART BEAT SOMETIMES  . Environmental allergies   . Esophageal reflux   . Gastritis   . Gluten intolerance   . Hiatal hernia 02/10/11   Noted on CT Scan - Moderate Hiatal Hernia  . Hyperlipemia   . Hypertension   . Hypertension   . Lactose intolerance   . Lymphoma of lymph nodes in pelvis ([facility] 03/03/2011    Large Right Retroperitoneal Mass  . Melanoma ([facility]    lymphoma  . Microcytic anemia 04/20/2011   . 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 Osteoarthritis    hands/feet,knees, NECK, BACK  . Pain    LOWER BACK PAIN - DDD, SCOLIOSIS, BONE SPUR.  FINISHED THE 3RD EPIDURAL STEROID INJECTION 7/15 - DONE BY DR. RNelva Bush - STILL HAVING BACK PAIN  . Periaortic lymphadenopathy 02/16/2011  . Personal history of  colonic polyps 2004   hyperplastic Dr. Collene Mares  . Renal cyst 02/10/11    Noted on CT Scan - Bilateral Renal Cysts  . S/P radiation therapy 03/15/11 - 04/09/11   Abdominal/ Pelvic Tumor, 3600 cGy/20 Fractions  . Sleep disorder    DOES NOT SLEEP WELL    Tobacco History: History  Smoking Status  . Former Smoker  . Packs/day: 1.00  . Years: 35.00  . Types: Pipe, Cigarettes  . Quit date: 02/23/1992  Smokeless Tobacco  . Never Used    Comment: quit 20 years ago   Counseling given: Not Answered   Outpatient Encounter Prescriptions as of 11/22/2016  Medication Sig  . ALBUTEROL SULFATE HFA IN Inhale into the  lungs as needed.  . ALPRAZolam (XANAX) 0.25 MG tablet Take 0.125-0.25 mg by mouth 2 (two) times daily as needed for anxiety. Reported on 08/19/2015  . budesonide-formoterol (SYMBICORT) 160-4.5 MCG/ACT inhaler Inhale 2 puffs into the lungs 2 (two) times daily.  . carvedilol (COREG) 6.25 MG tablet Take 6.25 mg by mouth 2 (two) times daily with a meal.   . CHOLECALCIFEROL PO Take 800 Units by mouth daily. Reported on 08/15/2015  . EPINEPHrine 0.15 MG/0.15ML IJ injection Inject into the muscle.  . finasteride (PROSCAR) 5 MG tablet Take 1 tablet (5 mg total) by mouth daily.  . furosemide (LASIX) 20 MG tablet Take 40 mg by mouth 2 (two) times daily.   . Lutein-Zeaxanthin 6-0.24 MG CAPS Take by mouth.  . montelukast (SINGULAIR) 10 MG tablet take 1 tablet by mouth at bedtime  . omeprazole (PRILOSEC) 20 MG capsule Take 20 mg by mouth daily.  . potassium chloride SA (K-DUR,KLOR-CON) 20 MEQ tablet Take 20 mEq by mouth 2 (two) times daily.  Marland Kitchen rOPINIRole (REQUIP) 1 MG tablet Take 1 tablet (1 mg total) by mouth at bedtime.  . sodium chloride (OCEAN) 0.65 % SOLN nasal spray Place 1 spray into both nostrils daily.  . tamsulosin (FLOMAX) 0.4 MG CAPS capsule   . traMADol (ULTRAM) 50 MG tablet Take 50 mg by mouth as needed.  . travoprost, benzalkonium, (TRAVATAN) 0.004 % ophthalmic solution 1 drop at bedtime.  . traZODone (DESYREL) 100 MG tablet Take 100 mg by mouth at bedtime.  . triazolam (HALCION) 0.25 MG tablet Take 1 tablet by mouth at bedtime as needed.  Marland Kitchen atorvastatin (LIPITOR) 20 MG tablet Take 20 mg by mouth daily.  . Fluticasone-Umeclidin-Vilant (TRELEGY ELLIPTA) 100-62.5-25 MCG/INH AEPB Inhale 1 puff into the lungs daily. (Patient not taking: Reported on 11/22/2016)   No facility-administered encounter medications on file as of 11/22/2016.      Review of Systems  Constitutional:   No  weight loss, night sweats,  Fevers, chills, +fatigue, or  lassitude.  HEENT:   No headaches,  Difficulty  swallowing,  Tooth/dental problems, or  Sore throat,                No sneezing, itching, ear ache, nasal congestion, post nasal drip,   CV:  No chest pain,  Orthopnea, PND, s , anasarca, dizziness, papitations, syncope.   GI  No heartburn, indigestion, abdominal pain, nausea, vomiting, diarrhea, change in bowel habits, loss of appetite, bloody stools.   Resp:    No chest wall deformity  Skin: no rash or lesions.  GU: no dysuria, change in color of urine, no urgency or frequency.  No flank pain, no hematuria   MS:  No joint pain or swelling.  No decreased range of motion.  No back  pain.    Physical Exam  BP 106/66 (BP Location: Left Arm, Cuff Size: Normal)   Pulse 86   Ht 5' 6.75" (1.695 m)   Wt 191 lb 6.4 oz (86.8 kg)   SpO2 92%   BMI 30.20 kg/m   GEN: A/Ox3; pleasant , NAD, elderly walks with cane    HEENT:  [place]/AT,  EACs-clear, TMs-wnl, NOSE-clear, THROAT-clear, no lesions, no postnasal drip or exudate noted.   NECK:  Supple w/ fair ROM; no JVD; normal carotid impulses w/o bruits; no thyromegaly or nodules palpated; no lymphadenopathy.    RESP  BB crackles ,  no accessory muscle use, no dullness to percussion  CARD:  RRR, no m/r/g, 1+  peripheral edema, pulses intact, no cyanosis or clubbing.  GI:   Soft & nt; nml bowel sounds; no organomegaly or masses detected.   Musco: Warm bil, no deformities or joint swelling noted.   Neuro: alert, no focal deficits noted.    Skin: Warm, no lesions or rashes    Lab Results:    BNP No results found for: BNP    Imaging: Dg Chest 2 View  Result Date: 10/26/2016 CLINICAL DATA:  Chronic obstructive pulmonary disease, hypoxia. EXAM: CHEST  2 VIEW COMPARISON:  Radiographs of August 21, 2015. FINDINGS: Stable cardiomediastinal silhouette is noted. No pneumothorax or pleural effusion is noted. Right lung is clear. Large hiatal hernia is again noted. Mild left basilar subsegmental atelectasis is noted and stable. Bony thorax is  unremarkable. IMPRESSION: Stable large hiatal hernia. Mild left basilar subsegmental atelectasis. Electronically Signed   By: Marijo Conception, M.D.   On: 10/26/2016 14:53   Ct Chest High Resolution  Result Date: 11/16/2016 CLINICAL DATA:  83 year old male with history of decreasing oxygen saturations with exertion than and worsening shortness of breath with exertion for the past year. On oxygen for 1 week. Evaluate for interstitial lung disease. EXAM: CT CHEST WITHOUT CONTRAST TECHNIQUE: Multidetector CT imaging of the chest was performed following the standard protocol without intravenous contrast. High resolution imaging of the lungs, as well as inspiratory and expiratory imaging, was performed. COMPARISON:  Chest CT 12/24/2014. FINDINGS: Cardiovascular: Heart size is normal. There is no significant pericardial fluid, thickening or pericardial calcification. There is aortic atherosclerosis, as well as atherosclerosis of the great vessels of the mediastinum and the coronary arteries, including calcified atherosclerotic plaque in the left main, left anterior descending and left circumflex coronary arteries. Mediastinum/Nodes: No pathologically enlarged mediastinal or hilar lymph nodes. Please note that accurate exclusion of hilar adenopathy is limited on noncontrast CT scans. Large hiatal hernia with predominantly intrathoracic stomach. No axillary lymphadenopathy. Lungs/Pleura: High-resolution images demonstrate some patchy basal predominant areas of ground-glass attenuation, septal thickening, thickening of the peribronchovascular interstitium and mild peripheral bronchiolectasis, most evident in the right lower lobe. Findings appear minimally progressed compared to prior study from 2016. No honeycombing. Inspiratory and expiratory imaging demonstrates very mild air trapping, indicative of mild small airways disease. No acute consolidative airspace disease. No pleural effusions. Several small subpleural  nodules associated with the minor fissure, considered to be benign subpleural lymph nodes. No other suspicious appearing pulmonary nodules or masses are noted. Azygos lobe (normal anatomical variant) incidentally noted. Upper Abdomen: Mild diffuse low attenuation throughout the visualized hepatic parenchyma, indicative of mild hepatic steatosis. Subcentimeter low-attenuation lesion in segment 4A of the liver is incompletely characterized on today's noncontrast CT examination, but similar to the prior study, likely a cyst. Aortic atherosclerosis. Low-attenuation lesions in the right kidney,  previously characterized as simple cysts, measuring up to 2.6 cm in the upper pole. Colonic diverticulosis. Musculoskeletal: There are no aggressive appearing lytic or blastic lesions noted in the visualized portions of the skeleton. IMPRESSION: 1. There are some areas of very mild fibrosis in the lung bases, particularly in the right lower lobe. 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 Repeat high-resolution chest CT is suggested in 12 months assess for temporal changes in the appearance of the lung parenchyma. 2. Aortic atherosclerosis, in addition to left main and 2 vessel coronary artery disease. 3. Large hiatal hernia. 4. Mild hepatic steatosis. 5. Colonic diverticulosis. Aortic Atherosclerosis (ICD10-I70.0). Electronically Signed   By: Vinnie Langton M.D.   On: 11/16/2016 16:04     Assessment & Plan:   COPD ([place]) Improved symptom control on TRELEGY  Check on prevnar vaccine    Check PFT on return   Plan  Patient Instructions  Continue on TRELEGY 1 puff daily. Rinse after.  Activity as tolerated.  Check to see if you have gotten Prevnar 13 Vaccine .  Follow up with Dr. Elsworth Soho  In 3 months and As needed       Pulmonary fibrosis ([place]) Very mild fibrosis in lung bases  R>L w/ minimal progression since 2016. Has not significant cough . Mild desats with ambulation but no severe enough for O2. He does have some Pulmonary HTN on prev. Echo . Says previous sleep study neg for OSA . Unable to find results.  For now continue to montior  Check PFT with [facility] on return  prevnar vaccine if not had  - he will check with PCP .      Rexene Edison, NP 11/22/2016

## 2016-11-25 NOTE — Progress Notes (Signed)
Reviewed & agree with plan  

## 2016-11-28 DIAGNOSIS — J449 Chronic obstructive pulmonary disease, unspecified: Secondary | ICD-10-CM | POA: Diagnosis not present

## 2016-12-16 DIAGNOSIS — G8929 Other chronic pain: Secondary | ICD-10-CM | POA: Diagnosis not present

## 2016-12-16 DIAGNOSIS — M545 Low back pain: Secondary | ICD-10-CM | POA: Diagnosis not present

## 2016-12-16 DIAGNOSIS — R609 Edema, unspecified: Secondary | ICD-10-CM | POA: Diagnosis not present

## 2016-12-16 DIAGNOSIS — E876 Hypokalemia: Secondary | ICD-10-CM | POA: Diagnosis not present

## 2016-12-29 DIAGNOSIS — H16223 Keratoconjunctivitis sicca, not specified as Sjogren's, bilateral: Secondary | ICD-10-CM | POA: Diagnosis not present

## 2016-12-29 DIAGNOSIS — J449 Chronic obstructive pulmonary disease, unspecified: Secondary | ICD-10-CM | POA: Diagnosis not present

## 2017-01-03 ENCOUNTER — Ambulatory Visit: Payer: PPO | Admitting: Allergy and Immunology

## 2017-01-03 DIAGNOSIS — J309 Allergic rhinitis, unspecified: Secondary | ICD-10-CM

## 2017-01-05 DIAGNOSIS — R945 Abnormal results of liver function studies: Secondary | ICD-10-CM | POA: Diagnosis not present

## 2017-01-10 ENCOUNTER — Ambulatory Visit: Payer: PPO | Admitting: Allergy and Immunology

## 2017-01-13 DIAGNOSIS — I5032 Chronic diastolic (congestive) heart failure: Secondary | ICD-10-CM | POA: Diagnosis not present

## 2017-01-13 DIAGNOSIS — R945 Abnormal results of liver function studies: Secondary | ICD-10-CM | POA: Diagnosis not present

## 2017-01-13 DIAGNOSIS — R531 Weakness: Secondary | ICD-10-CM | POA: Diagnosis not present

## 2017-01-13 DIAGNOSIS — R609 Edema, unspecified: Secondary | ICD-10-CM | POA: Diagnosis not present

## 2017-01-13 DIAGNOSIS — J449 Chronic obstructive pulmonary disease, unspecified: Secondary | ICD-10-CM | POA: Diagnosis not present

## 2017-01-13 DIAGNOSIS — E876 Hypokalemia: Secondary | ICD-10-CM | POA: Diagnosis not present

## 2017-01-28 DIAGNOSIS — J449 Chronic obstructive pulmonary disease, unspecified: Secondary | ICD-10-CM | POA: Diagnosis not present

## 2017-02-02 ENCOUNTER — Encounter: Payer: Self-pay | Admitting: Cardiovascular Disease

## 2017-02-02 ENCOUNTER — Ambulatory Visit: Payer: Medicare HMO | Admitting: Cardiovascular Disease

## 2017-02-02 VITALS — BP 122/78 | HR 93 | Ht 66.75 in | Wt 195.0 lb

## 2017-02-02 DIAGNOSIS — I5033 Acute on chronic diastolic (congestive) heart failure: Secondary | ICD-10-CM

## 2017-02-02 DIAGNOSIS — I10 Essential (primary) hypertension: Secondary | ICD-10-CM

## 2017-02-02 DIAGNOSIS — R600 Localized edema: Secondary | ICD-10-CM | POA: Diagnosis not present

## 2017-02-02 MED ORDER — METOLAZONE 2.5 MG PO TABS: 2.5000 mg | ORAL_TABLET | ORAL | 3 refills | Status: DC

## 2017-02-02 NOTE — Assessment & Plan Note (Addendum)
History of essential hypertension blood pressure measured 122/78. He currently is on amlodipine. Major complaint is of lower extremity edema. I am going to stop his amlodipine. I recommended her to keep a blood pressure log on a daily basis. He will see Erasmo Downer back 1 month to review his blood pressures and lab work.

## 2017-02-02 NOTE — Progress Notes (Signed)
02/02/2017 Ryan [name]   1931-11-02  [id number]  Primary Physician Chesley Noon, MD Primary Cardiologist: Lorretta Harp MD [name], [place]  HPI:  Ryan [name] is a 84 y.o.  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 He is accompanied by his wife Ryan [name] today. He was referred by Dr. Melford Aase for cardiovascular evaluation because of lower extremity edema. I last saw him in the office  04/02/16.His cardiac resector profiles are notable for treated hypertension. He smoked tobacco remotely having stopped 25-30 years ago. He's never had a heart attack or stroke and denies chest pain or shortness of breath. He did have back surgery performed by Dr. Ellene Route December of last year after which she had urinary obstruction and lower extremity edema which resolved with relief of the obstruction and additional diuretics. He is aware of salt restriction but does admit to dietary indiscretion. His edema had significantly improved with the addition of low-dose diuretics and these were recently increased by his primary care provider. A 2-D echo was performed showed normal LV function with grade 1 diastolic dysfunction and moderate pulmonary hypertension. He does admit to orthopnea as well. A Myoview stress test performed 09/12/15 was low risk. A 2-D echo revealed normal LV systolic function with moderate pulmonary hypertension and a 48 hour monitor showed sinus rhythm with PACs. Since I saw him in the office 9 months ago he's done well although he has had progressive dyspnea on exertion or lower extremity edema. Apparently he was on Zaroxolyn which was recently discontinued because of hypokalemia. His major complaints are of progressive bilateral lower extremity edema with weight gain.     Current Meds  Medication Sig  . ALBUTEROL SULFATE HFA IN Inhale into the lungs as needed.  . ALPRAZolam (XANAX) 0.25  MG tablet Take 0.125-0.25 mg by mouth 2 (two) times daily as needed for anxiety. Reported on 08/19/2015  . budesonide-formoterol (SYMBICORT) 160-4.5 MCG/ACT inhaler Inhale 2 puffs into the lungs 2 (two) times daily.  . finasteride (PROSCAR) 5 MG tablet Take 1 tablet (5 mg total) by mouth daily.  . furosemide (LASIX) 20 MG tablet Take 40 mg by mouth 2 (two) times daily.   . Lutein-Zeaxanthin 6-0.24 MG CAPS Take by mouth.  Marland Kitchen omeprazole (PRILOSEC) 20 MG capsule Take 20 mg by mouth daily.  . potassium chloride SA (K-DUR,KLOR-CON) 20 MEQ tablet Take 20 mEq by mouth 2 (two) times daily.  Marland Kitchen rOPINIRole (REQUIP) 1 MG tablet Take 1 tablet (1 mg total) by mouth at bedtime.  . sodium chloride (OCEAN) 0.65 % SOLN nasal spray Place 1 spray into both nostrils daily.  . tamsulosin (FLOMAX) 0.4 MG CAPS capsule   . traMADol (ULTRAM) 50 MG tablet Take 50 mg by mouth as needed.  . travoprost, benzalkonium, (TRAVATAN) 0.004 % ophthalmic solution 1 drop at bedtime.  . traZODone (DESYREL) 100 MG tablet Take 100 mg by mouth at bedtime.  . triazolam (HALCION) 0.25 MG tablet Take 1 tablet by mouth at bedtime as needed.     Allergies  Allergen Reactions  . Pollen Extract-Tree Extract Other (See Comments)    HEADACHES, TIRED , DRAINAGE FROM SINUSES  . Molds & Smuts Other (See Comments)    Also dust mites causes sinus infections, h/a etc.    Social History   Socioeconomic History  . Marital status: Married    Spouse name: Not on file  .  Number of children: 2  . Years of education: Not on file  . Highest education level: Not on file  Social Needs  . Financial resource strain: Not on file  . Food insecurity - worry: Not on file  . Food insecurity - inability: Not on file  . Transportation needs - medical: Not on file  . Transportation needs - non-medical: Not on file  Occupational History  . 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 Smoking status: Former Smoker     Packs/day: 1.00    Years: 35.00    Pack years: 35.00    Types: Pipe, Cigarettes    Last attempt to quit: 02/23/1992    Years since quitting: 24.9  . Smokeless tobacco: Never Used  . Tobacco comment: quit 20 years ago  Substance and Sexual Activity  . Alcohol use: Yes    Comment: 2 drinks daily scotch  AND WINE WITH SUPPER  . Drug use: No  . Sexual activity: Not on file  Other Topics Concern  . Not on file  Social History Narrative   Married - second marriage   He has two children   Retired Horticulturist, commercial Professor   Currently teaches pottery making, 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[facility]   Former Smoker quit 12 years ago- smoked for 35 years   Alcohol use- yes     Review of Systems: General: negative for chills, fever, night sweats or weight changes.  Cardiovascular: negative for chest pain, dyspnea on exertion, edema, orthopnea, palpitations, paroxysmal nocturnal dyspnea or shortness of breath Dermatological: negative for rash Respiratory: negative for cough or wheezing Urologic: negative for hematuria Abdominal: negative for nausea, vomiting, diarrhea, bright red blood per rectum, melena, or hematemesis Neurologic: negative for visual changes, syncope, or dizziness All other systems reviewed and are otherwise negative except as noted above.    Blood pressure 122/78, pulse 93, height 5' 6.75" (1.695 m), weight 195 lb (88.5 kg).  General appearance: alert and no distress Neck: no adenopathy, no carotid bruit, no JVD, supple, symmetrical, trachea midline and thyroid not enlarged, symmetric, no tenderness/mass/nodules Lungs: clear to auscultation bilaterally Heart: regular rate and rhythm, S1, S2 normal, no murmur, click, rub or gallop Extremities: 2-3+ pitting edema bilaterally Pulses: 2+ and symmetric Skin: Skin color, texture, turgor normal. No rashes or lesions Neurologic: Alert and oriented X 3, normal strength and tone. Normal symmetric reflexes. Normal coordination and  gait  EKG sinus rhythm at 93 with right axis deviation, incomplete right bundle branch block with PVCs. I personally reviewed this EKG.  ASSESSMENT AND PLAN:   HYPERLIPIDEMIA History of hyperlipidemia not on statin therapy followed by his PCP  HYPERTENSION History of essential hypertension blood pressure measured 122/78. He currently is on amlodipine. Major complaint is of lower extremity edema. I am going to stop his amlodipine. I recommended her to keep a blood pressure log on a daily basis. He will see Erasmo Downer back 1 month to review his blood pressures and lab work.  Bilateral lower extremity edema Mr. Hoselton has 2-3+ pitting bilateral lower extremity edema. He does admit to some dietary indiscretion with regard to salt. He is on amlodipine 10 mg a day, as well as furosemide. I'm going to add Zaroxolyn 2.5 mg every other morning half-hour before his morning furosemide dose and we'll check a basic metabolic panel in 1-75 days. Discontinue his amlodipine. He will see Erasmo Downer back in the office in one month to review his labs  and blood pressure.      Lorretta Harp MD FACP,FACC,FAHA, [facility] 02/02/2017 12:03 PM

## 2017-02-02 NOTE — Assessment & Plan Note (Signed)
History of hyperlipidemia not on statin therapy followed by his PCP

## 2017-02-02 NOTE — Assessment & Plan Note (Signed)
Ryan [name] has 2-3+ pitting bilateral lower extremity edema. He does admit to some dietary indiscretion with regard to salt. He is on amlodipine 10 mg a day, as well as furosemide. I'm going to add Zaroxolyn 2.5 mg every other morning half-hour before his morning furosemide dose and we'll check a basic metabolic panel in 7-82 days. Discontinue his amlodipine. He will see Erasmo Downer back in the office in one month to review his labs and blood pressure.

## 2017-02-02 NOTE — Patient Instructions (Signed)
Medication Instructions: STOP Amlodipine  START Zaroxolyn (Metolazone) 2.5 mg every other day, 30 minutes prior to your morning Lasix (Furosemide) dose.  Labwork: Your physician recommends that you return for lab work in: 7-10 days--BMET   Follow-Up: Your physician recommends that you schedule a follow-up appointment in: 1 month with PharmD for BP med and check. Your physician has requested that you regularly monitor and record your blood pressure readings at home. Please use the same machine at the same time of day to check your readings and record them to bring to your follow-up visit.  We request that you follow-up in: 6 weeks with Kerin Ransom, PA and in 3 months with Dr Andria Rhein will receive a reminder letter in the mail two months in advance. If you don't receive a letter, please call our office to schedule the follow-up appointment.  If you need a refill on your cardiac medications before your next appointment, please call your pharmacy.

## 2017-02-03 ENCOUNTER — Encounter: Payer: Self-pay | Admitting: Cardiovascular Disease

## 2017-02-06 ENCOUNTER — Telehealth: Payer: Self-pay | Admitting: Nurse Practitioner

## 2017-02-06 NOTE — Telephone Encounter (Signed)
   Patient was recently seen in clinic in the setting of significant lower extremity swelling.  He was placed on metolazone 2.5 mg every other morning.  Since starting this, he is down 15 pounds.  His lower extremity swelling has almost completely resolved.  Over the past 2 days, he has had some generalized malaise and mild abdominal discomfort with constipation.  No diarrhea or fevers.  No abdominal tenderness.  Today, he was using his pulse oximeter and noted variable heart rates between the 90s and periodically down to the 40s.  He was asymptomatic.  I reviewed his ECG from the office and noted that he had PACs and PVC.  We discussed how in the absence of an ECG, it was not clear what was occurring when he noted bradycardia on the pulse oximeter, but in the setting of PACs and PVCs, it was possible that these were not perfusing a pulse which would account for what he was seeing on the device.  He is not interested in presenting to an urgent care for an ECG and so long as he is stable, I feel that it is feasible for him to stay home.  I did recommend that he have a follow-up basic metabolic panel tomorrow instead of on Wednesday in the setting of significant weight loss.  Caller verbalized understanding and was grateful for the call back.  Murray Hodgkins, NP 02/06/2017, 2:16 PM

## 2017-02-07 ENCOUNTER — Other Ambulatory Visit: Payer: Medicare HMO | Admitting: *Deleted

## 2017-02-08 ENCOUNTER — Ambulatory Visit (INDEPENDENT_AMBULATORY_CARE_PROVIDER_SITE_OTHER): Payer: Medicare HMO | Admitting: Allergy and Immunology

## 2017-02-08 ENCOUNTER — Encounter: Payer: Self-pay | Admitting: Allergy and Immunology

## 2017-02-08 VITALS — BP 130/88 | HR 56 | Ht 66.75 in | Wt 180.0 lb

## 2017-02-08 DIAGNOSIS — J441 Chronic obstructive pulmonary disease with (acute) exacerbation: Secondary | ICD-10-CM

## 2017-02-08 DIAGNOSIS — J3089 Other allergic rhinitis: Secondary | ICD-10-CM

## 2017-02-08 DIAGNOSIS — J4540 Moderate persistent asthma, uncomplicated: Secondary | ICD-10-CM | POA: Diagnosis not present

## 2017-02-08 DIAGNOSIS — J8410 Pulmonary fibrosis, unspecified: Secondary | ICD-10-CM | POA: Diagnosis not present

## 2017-02-08 LAB — BASIC METABOLIC PANEL
BUN / CREAT RATIO: 19 (ref 10–24)
BUN: 29 mg/dL — AB (ref 8–27)
CALCIUM: 9.2 mg/dL (ref 8.6–10.2)
CO2: 34 mmol/L — ABNORMAL HIGH (ref 20–29)
CREATININE: 1.53 mg/dL — AB (ref 0.76–1.27)
Chloride: 86 mmol/L — ABNORMAL LOW (ref 96–106)
GFR, EST AFRICAN AMERICAN: 48 mL/min/{1.73_m2} — AB (ref >59)
GFR, EST NON AFRICAN AMERICAN: 41 mL/min/{1.73_m2} — AB (ref >59)
Glucose: 108 mg/dL — ABNORMAL HIGH (ref 65–99)
Potassium: 2.8 mmol/L — ABNORMAL LOW (ref 3.5–5.2)
Sodium: 143 mmol/L (ref 134–144)

## 2017-02-08 MED ORDER — BUDESONIDE-FORMOTEROL FUMARATE 160-4.5 MCG/ACT IN AERO: 2.0000 | INHALATION_SPRAY | Freq: Two times a day (BID) | RESPIRATORY_TRACT | 5 refills | Status: DC

## 2017-02-08 NOTE — Assessment & Plan Note (Addendum)
Stable.  Continue appropriate allergen avoidance measures, fluticasone nasal spray as needed, as well as nasal saline spray/rinse if needed.

## 2017-02-08 NOTE — Patient Instructions (Signed)
Asthma/COPD  Continue Symbicort 160-4.5 g, 2 inhalations via spacer device twice daily, supplemental oxygen at bedtime, and albuterol HFA, 1 - 2 inhalations every 4-6 hours as needed.  Subjective and objective measures of pulmonary function will be followed and the treatment plan will be adjusted accordingly.  Allergic rhinitis with a nonallergic component Stable.  Continue appropriate allergen avoidance measures, fluticasone nasal spray as needed, as well as nasal saline spray/rinse if needed.   Return in about 5 months (around 07/09/2017), or if symptoms worsen or fail to improve.

## 2017-02-08 NOTE — Assessment & Plan Note (Signed)
Continue Symbicort 160-4.5 g, 2 inhalations via spacer device twice daily, supplemental oxygen at bedtime, and albuterol HFA, 1 - 2 inhalations every 4-6 hours as needed.  Subjective and objective measures of pulmonary function will be followed and the treatment plan will be adjusted accordingly.

## 2017-02-08 NOTE — Progress Notes (Signed)
Follow-up Note  RE: Ryan [name] MRN: [id number] DOB: 1931-10-30 Date of Office Visit: 02/08/2017  Primary care provider: Chesley Noon, MD Referring provider: Chesley Noon, MD  History of present illness: Ryan [name] is a 84 y.o. male with asthma/COPD and allergic rhinitis presenting today for follow-up.  He was last seen in this clinic in September 2018.  He reports that he could not tell any difference while using Trelegy Ellipta so he went back to Symbicort 160-4.5 g, 2 inhalations via spacer device twice daily.  He reports that his O2 sats have been stable.  He is using oxygen throughout the night and believes that this has been beneficial.  He was evaluated by a pulmonologist, Dr. Kara Mead in October 2018. He had a high resolution chest CT revealing mild fibrosis in the lung bases, particularly the right lower lobe and it was recommended that the study be repeated in 1 year.  He has no nasal allergy symptom complaints today.   Assessment and plan: Asthma/COPD  Continue Symbicort 160-4.5 g, 2 inhalations via spacer device twice daily, supplemental oxygen at bedtime, and albuterol HFA, 1 - 2 inhalations every 4-6 hours as needed.  Subjective and objective measures of pulmonary function will be followed and the treatment plan will be adjusted accordingly.  Allergic rhinitis with a nonallergic component Stable.  Continue appropriate allergen avoidance measures, fluticasone nasal spray as needed, as well as nasal saline spray/rinse if needed.   Meds ordered this encounter  Medications  . budesonide-formoterol (SYMBICORT) 160-4.5 MCG/ACT inhaler    Sig: Inhale 2 puffs into the lungs 2 (two) times daily.    Dispense:  1 Inhaler    Refill:  5    Diagnostics: Spirometry reveals an FVC of 1.81 L and an FEV1 of 1.51 L (64% predicted) with an FEV1 ratio of 115%.  FEV1 is improved today compared with his previous visit.  Please see scanned spirometry results for  details.    Physical examination: Blood pressure 130/88, pulse (!) 56, height 5' 6.75" (1.695 m), weight 180 lb (81.6 kg), SpO2 94 %.  General: Alert, interactive, in no acute distress. HEENT: TMs pearly gray, turbinates minimally edematous without discharge, post-pharynx unremarkable. Neck: Supple without lymphadenopathy. Lungs: Mildly decreased breath sounds bilaterally without wheezing or rhonchi. Faint bibasilar rales auscultated. CV: Normal S1, S2 without murmurs. Skin: Warm and dry, without lesions or rashes.  The following portions of the patient's history were reviewed and updated as appropriate: allergies, current medications, past family history, past medical history, past social history, past surgical history and problem list.  Allergies as of 02/08/2017      Reactions   Pollen Extract-tree Extract Other (See Comments)   HEADACHES, TIRED , DRAINAGE FROM SINUSES   Molds & Smuts Other (See Comments)   Also dust mites causes sinus infections, h/a etc.      Medication List        Accurate as of 02/08/17  1:02 PM. Always use your most recent med list.          ALBUTEROL SULFATE HFA IN Inhale into the lungs as needed.   ALPRAZolam 0.25 MG tablet Commonly known as:  XANAX Take 0.125-0.25 mg by mouth 2 (two) times daily as needed for anxiety. Reported on 08/19/2015   budesonide-formoterol 160-4.5 MCG/ACT inhaler Commonly known as:  SYMBICORT Inhale 2 puffs into the lungs 2 (two) times daily.   finasteride 5 MG tablet Commonly known as:  PROSCAR Take 1 tablet (5 mg  total) by mouth daily.   furosemide 40 MG tablet Commonly known as:  LASIX 40 mg. Take 2 tablets by mouth every morning and 1 tablet in the afternoon.   Lutein-Zeaxanthin 6-0.24 MG Caps Take by mouth.   metolazone 2.5 MG tablet Commonly known as:  ZAROXOLYN Take 1 tablet (2.5 mg total) by mouth every other day. 30 minutes prior to morning dose of Lasix.   omeprazole 20 MG capsule Commonly known as:   PRILOSEC Take 20 mg by mouth daily.   potassium chloride SA 20 MEQ tablet Commonly known as:  K-DUR,KLOR-CON Take 40 mEq by mouth 2 (two) times daily.   rOPINIRole 1 MG tablet Commonly known as:  REQUIP Take 1 tablet (1 mg total) by mouth at bedtime.   sodium chloride 0.65 % Soln nasal spray Commonly known as:  OCEAN Place 1 spray into both nostrils daily.   tamsulosin 0.4 MG Caps capsule Commonly known as:  FLOMAX   traMADol 50 MG tablet Commonly known as:  ULTRAM Take 50 mg by mouth as needed.   travoprost (benzalkonium) 0.004 % ophthalmic solution Commonly known as:  TRAVATAN 1 drop at bedtime.   triazolam 0.25 MG tablet Commonly known as:  HALCION Take 1 tablet by mouth at bedtime as needed.       Allergies  Allergen Reactions  . Pollen Extract-Tree Extract Other (See Comments)    HEADACHES, TIRED , DRAINAGE FROM SINUSES  . Molds & Smuts Other (See Comments)    Also dust mites causes sinus infections, h/a etc.    I appreciate the opportunity to take part in Ryan [name]'s care. Please do not hesitate to contact me with questions.  Sincerely,   R. Edgar Frisk, MD

## 2017-02-11 ENCOUNTER — Telehealth: Payer: Self-pay | Admitting: Cardiovascular Disease

## 2017-02-11 NOTE — Telephone Encounter (Signed)
Closed Encounter

## 2017-02-11 NOTE — Telephone Encounter (Signed)
Returned the call to the patient. He stated that with the addition of the Metolazone 2.5 mg every other day he has lost 12 pounds and his blood pressure has started to decline. The patient also takes Furosemide 80 mg bid.  Today: 84/60 HR 64 Yesterday: 97/71 HR 63 1/9 109/67 HR 66 1/8 128/80 HR 80 1/7 129/85 HR 105  Per Dr. Gwenlyn Found, the patient should discontinue the Metolazone and get an appointment with pharmd for next week. The patient has verbalized his understanding.

## 2017-02-11 NOTE — Telephone Encounter (Signed)
New Message   Pt c/o BP issue:  1. What are your last 5 BP readings? 129/85, 128/80, 109/67, 97/71, 84/60 2. Are you having any other symptoms (ex. Dizziness, headache, blurred vision, passed out) blurred vision, abdominal pain, headache, dizziness nausea  3. What is your medication issue? None  Patient states that his bp has been declining since last week. Patient recently had bloodwork but has not gotten the results. Please call.

## 2017-02-16 ENCOUNTER — Other Ambulatory Visit: Payer: Self-pay | Admitting: Adult Health

## 2017-02-16 DIAGNOSIS — J441 Chronic obstructive pulmonary disease with (acute) exacerbation: Secondary | ICD-10-CM

## 2017-02-17 ENCOUNTER — Ambulatory Visit (INDEPENDENT_AMBULATORY_CARE_PROVIDER_SITE_OTHER): Payer: Medicare HMO | Admitting: Pulmonary Disease

## 2017-02-17 ENCOUNTER — Encounter: Payer: Self-pay | Admitting: Pulmonary Disease

## 2017-02-17 ENCOUNTER — Other Ambulatory Visit: Payer: Medicare HMO

## 2017-02-17 DIAGNOSIS — J8410 Pulmonary fibrosis, unspecified: Secondary | ICD-10-CM

## 2017-02-17 DIAGNOSIS — J441 Chronic obstructive pulmonary disease with (acute) exacerbation: Secondary | ICD-10-CM | POA: Diagnosis not present

## 2017-02-17 DIAGNOSIS — R0902 Hypoxemia: Secondary | ICD-10-CM

## 2017-02-17 LAB — PULMONARY FUNCTION TEST
DL/VA % pred: 98 %
DL/VA: 4.25 ml/min/mmHg/L
DLCO COR % PRED: 53 %
DLCO COR: 14.51 ml/min/mmHg
DLCO UNC: 14 ml/min/mmHg
DLCO unc % pred: 51 %
FEF 25-75 POST: 1.66 L/s
FEF 25-75 PRE: 1.4 L/s
FEF2575-%Change-Post: 18 %
FEF2575-%PRED-PRE: 96 %
FEF2575-%Pred-Post: 114 %
FEV1-%Change-Post: 3 %
FEV1-%PRED-PRE: 65 %
FEV1-%Pred-Post: 68 %
FEV1-POST: 1.53 L
FEV1-Pre: 1.47 L
FEV1FVC-%Change-Post: 2 %
FEV1FVC-%PRED-PRE: 113 %
FEV6-%CHANGE-POST: 0 %
FEV6-%PRED-POST: 61 %
FEV6-%PRED-PRE: 61 %
FEV6-POST: 1.85 L
FEV6-Pre: 1.83 L
FEV6FVC-%CHANGE-POST: 0 %
FEV6FVC-%PRED-POST: 108 %
FEV6FVC-%Pred-Pre: 108 %
FVC-%Change-Post: 1 %
FVC-%PRED-POST: 57 %
FVC-%Pred-Pre: 56 %
FVC-POST: 1.85 L
FVC-PRE: 1.83 L
POST FEV6/FVC RATIO: 100 %
PRE FEV1/FVC RATIO: 80 %
Post FEV1/FVC ratio: 82 %
Pre FEV6/FVC Ratio: 100 %
RV % pred: 71 %
RV: 1.8 L
TLC % PRED: 62 %
TLC: 3.93 L

## 2017-02-17 NOTE — Assessment & Plan Note (Signed)
Your oxygen levels are borderline and if you get worse we may consider portable oxygen in the daytime.  Meanwhile continue oxygen  during sleep

## 2017-02-17 NOTE — Patient Instructions (Signed)
  You have early fibrosis in your lungs which may be due to inflammation in the lungs.  Your oxygen levels are borderline and if you get worse we may consider portable oxygen in the daytime.  Meanwhile continue oxygen  during sleep  Referral to pulmonary rehab program  call us as needed

## 2017-02-17 NOTE — Assessment & Plan Note (Signed)
We will do basic collagen vascular workup Would defer treatment unless clinically worsening  Referral to pulmonary rehab program  call us as needed

## 2017-02-17 NOTE — Progress Notes (Signed)
Subjective:    Patient ID: Ryan [name], male    DOB: 03-17-1931, 84 y.o.   MRN: [id number]  HPI  84 year old male former smoker  For FU of ILD & hypoxia   He smoked about half pack per day starting with a pipe and then moved on to cigarettes, less than 30-pack-years before he quit in 1994  He has a history of lymphoma and is a large hiatal hernia, he is having more abdominal symptoms and wonders if lymphoma is coming back.  He was evaluated for mild hypoxia on exertion and is on nocturnal oxygen but has decided to hold off on daytime oxygen.  Over the last 3 months his oxygen saturations occasionally have been in the low 80s but lately have been more in the low 90s.  He is not very active and ambulates with a cane, now resides in independent living habits would. He denies cough or sputum production.  PFTs were reviewed today which show moderate restriction with FVC of 56%, ratio 80, TLC of 62% with DLCO of 51% that corrects for alveolar volume  Imaging studies were reviewed which show mild ILD dating back to 2015   Significant tests/ events reviewed   Spirometry 10/2011 showed moderate airway obstruction with a ratio of 67 and FEV1 of 64% Reviewed all his other prior spirometry is which showed poor effort and show restriction rather than obstruction CT chest 01/2015 large hiatal hernia and small focus of consolidation right lower lobe Spirometry 10/26/2016 FEV1 at 60%, ratio 57 , FVC 79%.   11/2016 HRCT >> very mild fibrosis in the lung bases,particularly in RLL " indeterminate ", favor fibrotic NSIP  Gated EF 09/2015 normal  08/2015 Echo GR 1 DD , PAP 88m Hg , EF nml   Past Medical History:  Diagnosis Date  . Abdominal pain    Bloating and gas  . Asthma   . Atelectasis, bilateral 02/10/11   Noted On CT Scan - Mild Dependent Atelectasis at the Lung Bases  . Barrett's esophagus   . Benign prostatic hypertrophy   . Bilateral lower extremity edema   . Chronic fatigue    . Congestion of throat    mouth breath at night, dry mouth  . Diverticulosis of colon (without mention of hemorrhage)   . Dysrhythmia    SKIPPED HEART BEAT SOMETIMES  . Environmental allergies   . Esophageal reflux   . Gastritis   . Gluten intolerance   . Hiatal hernia 02/10/11   Noted on CT Scan - Moderate Hiatal Hernia  . Hyperlipemia   . Hypertension   . Hypertension   . Lactose intolerance   . 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 Melanoma ([facility]    lymphoma  . Microcytic anemia 04/20/2011   . 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 Osteoarthritis    hands/feet,knees, NECK, BACK  . Pain    LOWER BACK PAIN - DDD, SCOLIOSIS, BONE SPUR.  FINISHED THE 3RD EPIDURAL STEROID INJECTION 7/15 - DONE BY DR. RNelva Bush - STILL HAVING BACK PAIN  . Periaortic lymphadenopathy 02/16/2011  . Personal history of colonic polyps 2004   hyperplastic Dr. MCollene Mares . Renal cyst 02/10/11    Noted on CT Scan - Bilateral Renal Cysts  . S/P radiation therapy 03/15/11 - 04/09/11   Abdominal/ Pelvic Tumor, 3600 cGy/20 Fractions  . Sleep disorder    DOES NOT  SLEEP WELL     Review of Systems neg for any significant sore throat, dysphagia, itching, sneezing, nasal congestion or excess/ purulent secretions, fever, chills, sweats, unintended wt loss, pleuritic or exertional cp, hempoptysis, orthopnea pnd or change in chronic leg swelling. Also denies presyncope, palpitations, heartburn, abdominal pain, nausea, vomiting, diarrhea or change in bowel or urinary habits, dysuria,hematuria, rash, arthralgias, visual complaints, headache, numbness weakness or ataxia.     Objective:   Physical Exam  Gen. Pleasant, well-nourished, in no distress, normal affect ENT - no lesions, no post nasal drip Neck: No JVD, no thyromegaly, no carotid bruits Lungs: no use of accessory muscles, no dullness to percussion, Rt  infrascap rales, no rhonchi  Cardiovascular: Rhythm regular, heart sounds  normal, no murmurs or gallops, no peripheral edema Abdomen: soft and non-tender, no hepatosplenomegaly, BS normal. Musculoskeletal: No deformities, no cyanosis or clubbing Neuro:  alert, non focal       Assessment & Plan:

## 2017-02-17 NOTE — Progress Notes (Signed)
PFT done today. 

## 2017-02-18 LAB — CYCLIC CITRUL PEPTIDE ANTIBODY, IGG: Cyclic Citrullin Peptide Ab: <16 UNITS

## 2017-02-21 LAB — ANA+ENA+DNA/DS+SCL 70+SJOSSA/B
ANA TITER 1: NEGATIVE
ENA RNP Ab: <0.2 AI (ref 0.0–0.9)
ENA SSA (RO) Ab: <0.2 AI (ref 0.0–0.9)
ENA SSB (LA) Ab: 0.2 AI (ref 0.0–0.9)
dsDNA Ab: <1 IU/mL (ref 0–9)

## 2017-02-22 ENCOUNTER — Ambulatory Visit (INDEPENDENT_AMBULATORY_CARE_PROVIDER_SITE_OTHER): Payer: Medicare HMO | Admitting: Pharmacist Clinician (PhC)/ Clinical Pharmacy Specialist

## 2017-02-22 VITALS — BP 118/80 | HR 76

## 2017-02-22 DIAGNOSIS — I5032 Chronic diastolic (congestive) heart failure: Secondary | ICD-10-CM | POA: Diagnosis not present

## 2017-02-22 LAB — BASIC METABOLIC PANEL
BUN/Creatinine Ratio: 14 (ref 10–24)
BUN: 15 mg/dL (ref 8–27)
CALCIUM: 8.9 mg/dL (ref 8.6–10.2)
CO2: 29 mmol/L (ref 20–29)
CREATININE: 1.09 mg/dL (ref 0.76–1.27)
Chloride: 100 mmol/L (ref 96–106)
GFR calc Af Amer: 72 mL/min/{1.73_m2} (ref >59)
GFR, EST NON AFRICAN AMERICAN: 62 mL/min/{1.73_m2} (ref >59)
GLUCOSE: 121 mg/dL — AB (ref 65–99)
Potassium: 4.4 mmol/L (ref 3.5–5.2)
Sodium: 144 mmol/L (ref 134–144)

## 2017-02-22 NOTE — Assessment & Plan Note (Signed)
Patient currently doing well, off amlodipine.  He had good results with metolazone, losing 12 pounds, but unfortunately also developed hypokalemia.  It was stopped and we will repeat BMET today after this visit.  He was advised to continue with the furosemide 80 mg twice daily and potassium 40 mEq bid and keep appointment with Lurena Joiner in about 3 weeks

## 2017-02-22 NOTE — Patient Instructions (Signed)
  Your blood pressure today is 118/82  Check your blood pressure at home several times each week and keep record of the readings.  Take your BP meds as follows:  Continue with furosemide 80 mg twice daily and potassium 40 mEq twice daily  We will call you in the next few days if there are any concerns with your labs.  Bring all of your meds, your BP cuff and your record of home blood pressures to your next appointment.  Exercise as you're able, try to walk approximately 30 minutes per day.  Keep salt intake to a minimum, especially watch canned and prepared boxed foods.  Eat more fresh fruits and vegetables and fewer canned items.  Avoid eating in fast food restaurants.    HOW TO TAKE YOUR BLOOD PRESSURE: . Rest 5 minutes before taking your blood pressure. .  Don't smoke or drink caffeinated beverages for at least 30 minutes before. . Take your blood pressure before (not after) you eat. . Sit comfortably with your back supported and both feet on the floor (don't cross your legs). . Elevate your arm to heart level on a table or a desk. . Use the proper sized cuff. It should fit smoothly and snugly around your bare upper arm. There should be enough room to slip a fingertip under the cuff. The bottom edge of the cuff should be 1 inch above the crease of the elbow. . Ideally, take 3 measurements at one sitting and record the average.

## 2017-02-22 NOTE — Progress Notes (Signed)
02/22/2017 Ryan [name] 1932/01/04 [id number]   HPI:  Ryan [name] is a 84 y.o. male patient of Dr Gwenlyn Found, with a PMH below who presents today for hypertension clinic evaluation.  He currently has well controlled blood pressure, without medication.  At his last visit with Dr. Gwenlyn Found on Jan 2, his amlodipine was stopped because of lower extremity edema.  He was already on furosemide 80 mg twice daily and was given metolazone 2.5 mg to take every other day.  Labs were drawn after 5 days and showed a drop in his potassium to 2.3.  He was told to increase his supplement to 40 mEq bid and return today for follow up.   He then called after a few more days to state he had lost 12 pounds.  The metolazone was held.  He returns today for follow up.Marland Kitchen    He saw his pulmonologist this past week and was told he had pulmonary fibrosis.  He went home and read about this on the Internet.  Apparently whatever he read suggested that exercise would be beneficial, so he has started walking in the halls of [place] daily.  He also notes chronic back and abdominal pain, states it is similar to what he felt when he had lymphoma - has an appointment with his oncologist in the next week.  Blood Pressure Goal:  130/80  Current Medications:  Furosemide 80 mg bid  Metolazone 2.5 mg - stopped on 02/11/17  No current meds for hypertension - was on amlodipine  Family Hx:  Father died from ruptured vessel at 75  Mother died at 65-97 - old age  Siblings passed - ALS and parkinsons,   Children all fine  Social Hx:  Quit 30 years ago; scotch daily; 1 cup of tea per day  Diet:  Lives in retirement facility - 5 meals/week there, makes own foods otherwise; some added salt, does avoid saltier foods in dining hall;  Exercise:  Lives at [employer] - jokes his apartment is furthest from front of facility  Home BP readings:   Has been as low as 84/60, but that was only one occasion, felt fine, other home readings  were all < 283 systolic   Estimated Creatinine Clearance: 53.1 mL/min (by C-G formula based on SCr of 1.09 mg/dL).  Wt Readings from Last 3 Encounters:  02/17/17 192 lb (87.1 kg)  02/08/17 180 lb (81.6 kg)  02/02/17 195 lb (88.5 kg)   BP Readings from Last 3 Encounters:  02/22/17 118/80  02/17/17 122/68  02/08/17 130/88   Pulse Readings from Last 3 Encounters:  02/22/17 76  02/17/17 94  02/08/17 (!) 56    Current Outpatient Medications  Medication Sig Dispense Refill  . ALBUTEROL SULFATE HFA IN Inhale into the lungs as needed.    . ALPRAZolam (XANAX) 0.25 MG tablet Take 0.125-0.25 mg by mouth 2 (two) times daily as needed for anxiety. Reported on 08/19/2015    . budesonide-formoterol (SYMBICORT) 160-4.5 MCG/ACT inhaler Inhale 2 puffs into the lungs 2 (two) times daily. 1 Inhaler 5  . finasteride (PROSCAR) 5 MG tablet Take 1 tablet (5 mg total) by mouth daily. 30 tablet 5  . furosemide (LASIX) 40 MG tablet 40 mg. Take 2 tablets by mouth every morning and 1 tablet in the afternoon.    . Lutein-Zeaxanthin 6-0.24 MG CAPS Take by mouth.    Marland Kitchen omeprazole (PRILOSEC) 20 MG capsule Take 20 mg by mouth daily.    Marland Kitchen  potassium chloride SA (K-DUR,KLOR-CON) 20 MEQ tablet Take 40 mEq by mouth 2 (two) times daily.     Marland Kitchen rOPINIRole (REQUIP) 1 MG tablet Take 1 tablet (1 mg total) by mouth at bedtime. 90 tablet 3  . sodium chloride (OCEAN) 0.65 % SOLN nasal spray Place 1 spray into both nostrils daily.    . tamsulosin (FLOMAX) 0.4 MG CAPS capsule   0  . traMADol (ULTRAM) 50 MG tablet Take 50 mg by mouth as needed.    . travoprost, benzalkonium, (TRAVATAN) 0.004 % ophthalmic solution 1 drop at bedtime.    . triazolam (HALCION) 0.25 MG tablet Take 1 tablet by mouth at bedtime as needed.     No current facility-administered medications for this visit.     Allergies  Allergen Reactions  . Pollen Extract-Tree Extract Other (See Comments)    HEADACHES, TIRED , DRAINAGE FROM SINUSES  . Molds & Smuts  Other (See Comments)    Also dust mites causes sinus infections, h/a etc.    Past Medical History:  Diagnosis Date  . Abdominal pain    Bloating and gas  . Asthma   . Atelectasis, bilateral 02/10/11   Noted On CT Scan - Mild Dependent Atelectasis at the Lung Bases  . Barrett's esophagus   . Benign prostatic hypertrophy   . Bilateral lower extremity edema   . Chronic fatigue   . Congestion of throat    mouth breath at night, dry mouth  . Diverticulosis of colon (without mention of hemorrhage)   . Dysrhythmia    SKIPPED HEART BEAT SOMETIMES  . Environmental allergies   . Esophageal reflux   . Gastritis   . Gluten intolerance   . Hiatal hernia 02/10/11   Noted on CT Scan - Moderate Hiatal Hernia  . Hyperlipemia   . Hypertension   . Hypertension   . Lactose intolerance   . 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 Melanoma ([place])    lymphoma  . Microcytic anemia 04/20/2011   . 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 Osteoarthritis    hands/feet,knees, NECK, BACK  . Pain    LOWER BACK PAIN - DDD, SCOLIOSIS, BONE SPUR.  FINISHED THE 3RD EPIDURAL STEROID INJECTION 7/15 - DONE BY DR. Nelva Bush. - STILL HAVING BACK PAIN  . Periaortic lymphadenopathy 02/16/2011  . Personal history of colonic polyps 2004   hyperplastic Dr. Collene Mares  . Renal cyst 02/10/11    Noted on CT Scan - Bilateral Renal Cysts  . S/P radiation therapy 03/15/11 - 04/09/11   Abdominal/ Pelvic Tumor, 3600 cGy/20 Fractions  . Sleep disorder    DOES NOT SLEEP WELL    Blood pressure 118/80, pulse 76.  HYPERTENSION Patient currently doing well, off amlodipine.  He had good results with metolazone, losing 12 pounds, but unfortunately also developed hypokalemia.  It was stopped and we will repeat BMET today after this visit.  He was advised to continue with the furosemide 80 mg twice daily and potassium 40 mEq bid and keep  appointment with Lurena Joiner in about 3 weeks   Tommy Medal PharmD CPP [facility] [address] [place], [place] [address] [phone]

## 2017-02-23 ENCOUNTER — Telehealth (HOSPITAL_COMMUNITY): Payer: Self-pay

## 2017-02-23 NOTE — Telephone Encounter (Signed)
Patients insurance is active and benefits verified through [facility] - $30.00 co-pay, no deductible, out of pocket amount of $4,200/$431.99 has been met, no co-insurance, and no pre-authorization is required. Spoke with Holland Falling - reference [id number]  Patient will be contacted and scheduled.

## 2017-03-03 ENCOUNTER — Inpatient Hospital Stay: Payer: Medicare HMO | Attending: Oncology | Admitting: Oncology

## 2017-03-03 VITALS — BP 154/75 | HR 71 | Temp 97.6°F | Resp 18 | Ht 66.0 in | Wt 189.7 lb

## 2017-03-03 DIAGNOSIS — C8599 Non-Hodgkin lymphoma, unspecified, extranodal and solid organ sites: Secondary | ICD-10-CM

## 2017-03-03 DIAGNOSIS — Z923 Personal history of irradiation: Secondary | ICD-10-CM

## 2017-03-03 DIAGNOSIS — C8590 Non-Hodgkin lymphoma, unspecified, unspecified site: Secondary | ICD-10-CM | POA: Diagnosis present

## 2017-03-03 DIAGNOSIS — J8410 Pulmonary fibrosis, unspecified: Secondary | ICD-10-CM | POA: Diagnosis not present

## 2017-03-03 NOTE — Progress Notes (Signed)
  [facility] OFFICE PROGRESS NOTE   Diagnosis: Non-Hodgkin's lymphoma  INTERVAL HISTORY:   Ryan [name] was last seen at the cancer center in August 2017.  He reports intermittent episodes of abdominal pain for the past several months.  The pain lasts for 2-3 weeks.  No nausea/vomiting.  No difficulty with bowel function.  No pain at other sites. He has been diagnosed with pulmonary fibrosis.  Objective:  Vital signs in last 24 hours:  Blood pressure (!) 154/75, pulse 71, temperature 97.6 F (36.4 C), temperature source Oral, resp. rate 18, height 5' 6" (1.676 m), weight 189 lb 11.2 oz (86 kg), SpO2 99 %.    HEENT: Neck without mass Lymphatics: No cervical, supraclavicular, axillary, or inguinal nodes Resp: Inspiratory rales at the lower posterior chest bilaterally, no respiratory distress Cardio: Irregular GI: No mass, no hepatosplenomegaly, nontender Vascular: Trace lower leg edema bilaterally  Lab Results:  Lab Results  Component Value Date   WBC 7.9 08/15/2015   HGB 14.5 08/15/2015   HCT 46.4 08/15/2015   MCV 99.8 08/15/2015   PLT 274 08/15/2015   NEUTROABS 3.9 06/26/2015    CMP     Component Value Date/Time   NA 144 02/22/2017 0956   NA 143 06/26/2015 0916   K 4.4 02/22/2017 0956   K 3.8 06/26/2015 0916   CL 100 02/22/2017 0956   CL 105 04/07/2012 0925   CO2 29 02/22/2017 0956   CO2 32 (H) 06/26/2015 0916   GLUCOSE 121 (H) 02/22/2017 0956   GLUCOSE 112 (H) 08/15/2015 1230   GLUCOSE 102 06/26/2015 0916   GLUCOSE 86 04/07/2012 0925   BUN 15 02/22/2017 0956   BUN 14.0 06/26/2015 0916   CREATININE 1.09 02/22/2017 0956   CREATININE 1.0 06/26/2015 0916   CALCIUM 8.9 02/22/2017 0956   CALCIUM 8.9 06/26/2015 0916   PROT 6.2 (L) 06/26/2015 0916   ALBUMIN 3.5 06/26/2015 0916   AST 17 06/26/2015 0916   ALT 15 06/26/2015 0916   ALKPHOS 87 06/26/2015 0916   BILITOT 0.50 06/26/2015 0916   GFRNONAA 62 02/22/2017 0956   GFRAA 72 02/22/2017 0956      Medications: I have reviewed the patient's current medications.   Assessment/Plan:  1. Non-Hodgkin's,, low-grade B cell diagnosed in January 2013, disease localized to the pelvis, status post involved field radiation to 3600cGy completed in March of 2013, no evidence of recurrent lymphoma on a CT 04/17/2013  No evidence of recurrent lymphoma on CTs of the chest, abdomen, and pelvis 12/24/2014  2. Intermittent abdominal pain  3. Chronic back pain-followed by Dr.Elsner status post epidural steroid injections and decompression surgery 12/31/2013  4. Anemia-history of iron deficiency, normal ferritin in August 2015 and November 2015    Disposition: Ryan [name] remains in clinical remission from non-Hodgkin's lymphoma.  The etiology of the abdominal pain is unclear.  He says the pain is similar to pain he had when he was diagnosed with lymphoma in 2013.  He would like to undergo a restaging CT.  He will be scheduled for a CT of the abdomen and pelvis.  He will be scheduled for a follow-up visit in 6 months.  We will see him sooner if the CT reveals evidence of lymphoma.  I recommended he follow-up with his primary physician or gastroenterologist to evaluate the abdominal pain if the CT is negative.    Betsy Coder, MD  03/03/2017  4:05 PM

## 2017-03-08 ENCOUNTER — Ambulatory Visit (HOSPITAL_COMMUNITY)
Admission: RE | Admit: 2017-03-08 | Discharge: 2017-03-08 | Disposition: A | Discharge status: Home / Self Care | Payer: Medicare HMO | Source: Ambulatory Visit | Attending: Oncology | Admitting: Oncology

## 2017-03-08 ENCOUNTER — Ambulatory Visit: Payer: Medicare HMO

## 2017-03-08 ENCOUNTER — Encounter (HOSPITAL_COMMUNITY): Payer: Self-pay

## 2017-03-08 DIAGNOSIS — C8599 Non-Hodgkin lymphoma, unspecified, extranodal and solid organ sites: Secondary | ICD-10-CM | POA: Diagnosis present

## 2017-03-08 DIAGNOSIS — K760 Fatty (change of) liver, not elsewhere classified: Secondary | ICD-10-CM | POA: Diagnosis not present

## 2017-03-08 DIAGNOSIS — I700 Atherosclerosis of aorta: Secondary | ICD-10-CM | POA: Insufficient documentation

## 2017-03-08 DIAGNOSIS — K4090 Unilateral inguinal hernia, without obstruction or gangrene, not specified as recurrent: Secondary | ICD-10-CM | POA: Insufficient documentation

## 2017-03-08 DIAGNOSIS — K449 Diaphragmatic hernia without obstruction or gangrene: Secondary | ICD-10-CM | POA: Insufficient documentation

## 2017-03-08 MED ORDER — IOPAMIDOL (ISOVUE-300) INJECTION 61%
INTRAVENOUS | Status: AC
  Filled 2017-03-08: qty 100

## 2017-03-08 MED ORDER — IOPAMIDOL (ISOVUE-300) INJECTION 61%
100.0000 mL | Freq: Once | INTRAVENOUS | Status: AC | PRN
  Administered 2017-03-08: 100 mL via INTRAVENOUS

## 2017-03-09 ENCOUNTER — Telehealth: Payer: Self-pay | Admitting: *Deleted

## 2017-03-09 NOTE — Telephone Encounter (Signed)
-----  Message from Ladell Pier, MD sent at 03/09/2017  3:22 PM EST ----- Please call patient, no evidence of recurrent lymphoma, has right inguinal hernia and a large hiatal hernia, it is possible the hiatal hernia is causing his abdominal symptoms.  He should follow-up with his primary physician or a surgeon Let us know if he does not have a surgeon and we can make a referral Follow-up here as scheduled

## 2017-03-09 NOTE — Telephone Encounter (Signed)
Called pt with CT result per MD note below. Pt does not have a surgeon but stated he will discuss this with pulmonologist and request referral then if needed. He will follow up here as scheduled.

## 2017-03-10 ENCOUNTER — Telehealth (HOSPITAL_COMMUNITY): Payer: Self-pay

## 2017-03-10 NOTE — Telephone Encounter (Signed)
Called patient to follow up about Pulmonary rehab - scheduled orientation on 03/21/2017 at 9:30am. Patient will attend the 10:30am exc class.

## 2017-03-16 ENCOUNTER — Ambulatory Visit: Payer: Medicare HMO | Admitting: Cardiology

## 2017-03-16 ENCOUNTER — Encounter: Payer: Self-pay | Admitting: Cardiology

## 2017-03-16 VITALS — BP 120/82 | HR 84 | Ht 66.0 in | Wt 190.0 lb

## 2017-03-16 DIAGNOSIS — C8580 Other specified types of non-Hodgkin lymphoma, unspecified site: Secondary | ICD-10-CM | POA: Diagnosis not present

## 2017-03-16 DIAGNOSIS — J8410 Pulmonary fibrosis, unspecified: Secondary | ICD-10-CM | POA: Diagnosis not present

## 2017-03-16 DIAGNOSIS — I10 Essential (primary) hypertension: Secondary | ICD-10-CM | POA: Diagnosis not present

## 2017-03-16 DIAGNOSIS — I5031 Acute diastolic (congestive) heart failure: Secondary | ICD-10-CM

## 2017-03-16 NOTE — Assessment & Plan Note (Signed)
Mildly progressive since 2015-Dr [facility] follows

## 2017-03-16 NOTE — Assessment & Plan Note (Signed)
Controlled

## 2017-03-16 NOTE — Patient Instructions (Signed)
Medication Instructions: Your physician recommends that you continue on your current medications as directed. Please refer to the Current Medication list given to you today.  Labwork: Your physician recommends that you return for lab work in: BMET--today   Follow-Up: Keep follow-up appointments currently scheduled, unless doing better--then ok to push them back.

## 2017-03-16 NOTE — Progress Notes (Signed)
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 He was seen by Dr Gwenlyn Found in 2016 for LE edema. He was though to have AF in 2017 but review of a Holter monitor showed NSR with PACs. Echo done 08/18/15 showed pulmonary HTN with a PA pressure of 58 mmHg, grade 1 DD, and normal LVF. He has been seen by Dr Elsworth Soho and was given the diagnosis of pulmonary fibrosis. Dr Benay Spice follows him for Non Hodgkin Lymphoma. A recent abdominal CT was unremarkable and Dr Benay Spice feels he is in remission.   He was seen by Dr Gwenlyn Found 02/02/17 and was felt to be volume overloaded. Zaroxolyn was added and this improved, he eventually lost 12 lbs. He did have hypokalemia and this was replaced. He is in the office today with his wife for follow up. He continues to do well, no further significant edema.    Current Outpatient Medications  Medication Sig Dispense Refill  . ALBUTEROL SULFATE HFA IN Inhale into the lungs as needed.    . ALPRAZolam (XANAX) 0.25 MG tablet Take 0.125-0.25 mg by mouth 2 (two) times daily as needed for anxiety. Reported on 08/19/2015    . budesonide-formoterol (SYMBICORT) 160-4.5 MCG/ACT inhaler Inhale 2 puffs into the lungs 2 (two) times daily. 1 Inhaler 5  . finasteride (PROSCAR) 5 MG tablet Take 1 tablet (5 mg total) by mouth daily. 30 tablet 5  . furosemide (LASIX) 40 MG tablet Take 80 mg by mouth 2 (two) times daily.    Marland Kitchen latanoprost (XALATAN) 0.005 % ophthalmic solution 1 drop 2 (two) times daily.    . Lutein-Zeaxanthin 6-0.24 MG CAPS Take by mouth.    Marland Kitchen omeprazole (PRILOSEC) 20 MG capsule Take 20 mg by mouth daily.    . potassium chloride SA (K-DUR,KLOR-CON) 20 MEQ tablet Take 40 mEq by mouth 2 (two) times daily.     . pramipexole (MIRAPEX) 0.75 MG tablet Take 0.75 mg by mouth at bedtime.    Marland Kitchen rOPINIRole (REQUIP) 1 MG  tablet Take 1 tablet (1 mg total) by mouth at bedtime. 90 tablet 3  . sodium chloride (OCEAN) 0.65 % SOLN nasal spray Place 1 spray into both nostrils daily.    . tamsulosin (FLOMAX) 0.4 MG CAPS capsule   0  . traMADol (ULTRAM) 50 MG tablet Take 50 mg by mouth as needed.    . travoprost, benzalkonium, (TRAVATAN) 0.004 % ophthalmic solution 1 drop at bedtime.    . triazolam (HALCION) 0.25 MG tablet Take 1 tablet by mouth at bedtime as needed.     No current facility-administered medications for this visit.     Allergies  Allergen Reactions  . Pollen Extract-Tree Extract Other (See Comments)    HEADACHES, TIRED , DRAINAGE FROM SINUSES  . Molds & Smuts Other (See Comments)    Also dust mites causes sinus infections, h/a etc.    Past Medical History:  Diagnosis Date  . Abdominal pain    Bloating and gas  . Asthma   . Atelectasis, bilateral 02/10/11   Noted On CT Scan - Mild Dependent Atelectasis at the Lung Bases  . Barrett's esophagus   . Benign prostatic hypertrophy   . Bilateral lower extremity edema   . Chronic fatigue   . Congestion of throat    mouth breath at night, dry mouth  . Diverticulosis  of colon (without mention of hemorrhage)   . Dysrhythmia    SKIPPED HEART BEAT SOMETIMES  . Environmental allergies   . Esophageal reflux   . Gastritis   . Gluten intolerance   . Hiatal hernia 02/10/11   Noted on CT Scan - Moderate Hiatal Hernia  . Hyperlipemia   . Hypertension   . Hypertension   . Lactose intolerance   . Lymphoma of lymph nodes in pelvis ([name]) 03/03/2011    Large Right Retroperitoneal Mass  . Melanoma ([name])    lymphoma  . Microcytic anemia 04/20/2011   . 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 Osteoarthritis    hands/feet,knees, NECK, BACK  . Pain    LOWER BACK PAIN - DDD, SCOLIOSIS, BONE SPUR.  FINISHED THE 3RD EPIDURAL STEROID INJECTION 7/15 - DONE BY DR. Nelva Bush. - STILL HAVING BACK PAIN  . Periaortic  lymphadenopathy 02/16/2011  . Personal history of colonic polyps 2004   hyperplastic Dr. Collene Mares  . Renal cyst 02/10/11    Noted on CT Scan - Bilateral Renal Cysts  . S/P radiation therapy 03/15/11 - 04/09/11   Abdominal/ Pelvic Tumor, 3600 cGy/20 Fractions  . Sleep disorder    DOES NOT SLEEP WELL    Social History   Socioeconomic History  . Marital status: Married    Spouse name: Not on file  . Number of children: 2  . Years of education: Not on file  . Highest education level: Not on file  Social Needs  . Financial resource strain: Not on file  . Food insecurity - worry: Not on file  . Food insecurity - inability: Not on file  . Transportation needs - medical: Not on file  . Transportation needs - non-medical: Not on file  Occupational History  . Occupation: retired    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  Tobacco Use  . Smoking status: Former Smoker    Packs/day: 1.00    Years: 35.00    Pack years: 35.00    Types: Pipe, Cigarettes    Last attempt to quit: 02/23/1992    Years since quitting: 25.0  . Smokeless tobacco: Never Used  . Tobacco comment: quit 20 years ago  Substance and Sexual Activity  . Alcohol use: Yes    Comment: 2 drinks daily scotch  AND WINE WITH SUPPER  . Drug use: No  . Sexual activity: Not on file  Other Topics Concern  . 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 Heart disease Father        MI 33  . Urticaria Father   . Prostate cancer Brother   . Prostate cancer Paternal Uncle   . Prostate cancer Paternal Uncle   . Prostate cancer Paternal Uncle   . Hyperlipidemia Unknown   . Stroke Unknown   . Hypertension Unknown   . ADD / ADHD Unknown   . Colon cancer Neg Hx     . Allergic rhinitis Neg Hx   . Asthma Neg Hx   . Eczema Neg Hx      Review of Systems: General: negative for chills, fever, night  sweats or weight changes.  Cardiovascular: negative for chest pain, dyspnea on exertion, edema, orthopnea, palpitations, paroxysmal nocturnal dyspnea or shortness of breath Dermatological: negative for rash Respiratory: negative for cough or wheezing Urologic: negative for hematuria Abdominal: negative for nausea, vomiting, diarrhea, bright red blood per rectum, melena, or hematemesis Neurologic: negative for visual changes, syncope, or dizziness All other systems reviewed and are otherwise negative except as noted above.    Blood pressure 120/82, pulse 84, height _0  (1.676 m), weight 190 lb (86.2 kg), SpO2 91 %.  General appearance: alert, cooperative, appears stated age and no distress Lungs: fine crackles Heart: regular rate and rhythm Extremities: trace edema Skin: Skin color, texture, turgor normal. No rashes or lesions Neurologic: Grossly normal   ASSESSMENT AND PLAN:   Acute diastolic CHF (congestive heart failure) (HCC) Improved after recent adjustment in his diuretics  Pulmonary fibrosis ([place]) Mildly progressive since 2015-Dr Alva follows  Non Hodgkin's lymphoma ([place]) In remission- followed by Dr Benay Spice  Essential hypertension Controlled   PLAN  Same Rx for now. He tells me his PCP handles his diuretics and we discussed weighing daily. We discussed exercise. Check BMP today. He has an appointment with Dr Gwenlyn Found in April. I told them if he was doing well then he could push this back 3 months.   Kerin Ransom PA-C 03/16/2017 12:57 PM

## 2017-03-16 NOTE — Assessment & Plan Note (Addendum)
Improved after recent adjustment in his diuretics

## 2017-03-16 NOTE — Assessment & Plan Note (Signed)
In remission- followed by Dr Benay Spice

## 2017-03-17 LAB — BASIC METABOLIC PANEL WITH GFR
BUN/Creatinine Ratio: 17 (ref 10–24)
BUN: 19 mg/dL (ref 8–27)
CO2: 27 mmol/L (ref 20–29)
Calcium: 9 mg/dL (ref 8.6–10.2)
Chloride: 100 mmol/L (ref 96–106)
Creatinine, Ser: 1.1 mg/dL (ref 0.76–1.27)
GFR calc Af Amer: 71 mL/min/{1.73_m2}
GFR calc non Af Amer: 62 mL/min/{1.73_m2}
Glucose: 81 mg/dL (ref 65–99)
Potassium: 4.4 mmol/L (ref 3.5–5.2)
Sodium: 144 mmol/L (ref 134–144)

## 2017-03-21 ENCOUNTER — Encounter (HOSPITAL_COMMUNITY)
Admission: RE | Admit: 2017-03-21 | Discharge: 2017-03-21 | Disposition: A | Discharge status: Home / Self Care | Payer: Medicare HMO | Source: Ambulatory Visit | Attending: Pulmonary Disease | Admitting: Pulmonary Disease

## 2017-03-21 ENCOUNTER — Encounter (HOSPITAL_COMMUNITY): Payer: Self-pay

## 2017-03-21 VITALS — BP 134/82 | HR 81 | Ht 67.0 in | Wt 190.0 lb

## 2017-03-21 DIAGNOSIS — Z87891 Personal history of nicotine dependence: Secondary | ICD-10-CM | POA: Insufficient documentation

## 2017-03-21 DIAGNOSIS — J8410 Pulmonary fibrosis, unspecified: Secondary | ICD-10-CM

## 2017-03-21 NOTE — Progress Notes (Signed)
[name] 84 y.o. male Pulmonary Rehab Orientation Note Patient arrived today in Cardiac and Pulmonary Rehab for orientation to Pulmonary Rehab. He was transported from [employer] via wheel chair. He does not carry portable oxygen. Per pt, he uses oxygen at night when sleeping at night. Color good, skin warm and dry. Patient is oriented to time and place. Patient's medical history, psychosocial health, and medications reviewed. Psychosocial assessment reveals pt lives [facility] at [facility], an assisted living facility. 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 Pt hobbies include writing, computer chess, bingo, and poker. Pt reports his stress level is moderate. Areas of stress/anxiety include past and present relationships with his ex-wife and girlfriend.  Pt does not exhibit signs of depression.  PHQ2/9 score 0/1. Pt shows good  coping skills with positive outlook .  Will continue to monitor and evaluate progress toward psychosocial goal(s) of no barriers to participation in pulmonary rehab. Physical assessment reveals heart rate is normal, breath sounds clear to auscultation, no wheezes, rales, or rhonchi. Grip strength equal, strong. Distal pulses 2+ bilateral posterior tibial pulses present with 2+ ankle edema on left and 1+ ankle edema on the right. Patient reports he does take medications as prescribed. Patient states he follows a Low Sodium diet. The patient reports no specific efforts to gain or lose weight.. Patient's weight will be monitored closely. Demonstration and practice of PLB using pulse oximeter. Patient able to return demonstration satisfactorily. Safety and hand hygiene in the exercise area reviewed with patient. Patient voices understanding of the information reviewed. Department expectations discussed with patient and achievable goals were set. The patient shows enthusiasm  about attending the program and we look forward to working with this nice gentleman. The patient is scheduled for a 6 min walk test on Tuesday, March 29, 2017 @ 3:30 pm and to begin exercise on Tuesday, April 05, 2017 in the 10:30 am class.   [id number]

## 2017-03-29 ENCOUNTER — Encounter (HOSPITAL_COMMUNITY)
Admission: RE | Admit: 2017-03-29 | Discharge: 2017-03-29 | Disposition: A | Discharge status: Home / Self Care | Payer: Medicare HMO | Source: Ambulatory Visit | Attending: Pulmonary Disease | Admitting: Pulmonary Disease

## 2017-03-29 DIAGNOSIS — J8410 Pulmonary fibrosis, unspecified: Secondary | ICD-10-CM

## 2017-03-31 NOTE — Progress Notes (Signed)
Pulmonary Individual Treatment Plan  Patient Details  Name: Ryan [name] MRN: [id number] Date of Birth: 01/18/1932 Referring Provider:     Pulmonary Rehab Walk Test from 03/29/2017 in [place]  Referring Provider  Dr. Elsworth Soho      Initial Encounter Date:    Pulmonary Rehab Walk Test from 03/29/2017 in [place]  Date  03/31/17  Referring Provider  Dr. Elsworth Soho      Visit Diagnosis: Pulmonary fibrosis ([place])  Patient's Home Medications on Admission:   Current Outpatient Medications:  .  ALBUTEROL SULFATE HFA IN, Inhale into the lungs as needed., Disp: , Rfl:  .  ALPRAZolam (XANAX) 0.25 MG tablet, Take 0.125-0.25 mg by mouth 2 (two) times daily as needed for anxiety. Reported on 08/19/2015, Disp: , Rfl:  .  budesonide-formoterol (SYMBICORT) 160-4.5 MCG/ACT inhaler, Inhale 2 puffs into the lungs 2 (two) times daily. (Patient not taking: Reported on 03/21/2017), Disp: 1 Inhaler, Rfl: 5 .  finasteride (PROSCAR) 5 MG tablet, Take 1 tablet (5 mg total) by mouth daily., Disp: 30 tablet, Rfl: 5 .  furosemide (LASIX) 40 MG tablet, Take 80 mg by mouth 2 (two) times daily., Disp: , Rfl:  .  latanoprost (XALATAN) 0.005 % ophthalmic solution, 1 drop 2 (two) times daily., Disp: , Rfl:  .  Lutein-Zeaxanthin 6-0.24 MG CAPS, Take by mouth., Disp: , Rfl:  .  omeprazole (PRILOSEC) 20 MG capsule, Take 20 mg by mouth daily., Disp: , Rfl:  .  potassium chloride SA (K-DUR,KLOR-CON) 20 MEQ tablet, Take 40 mEq by mouth 2 (two) times daily. , Disp: , Rfl:  .  pramipexole (MIRAPEX) 0.75 MG tablet, Take 0.75 mg by mouth at bedtime., Disp: , Rfl:  .  rOPINIRole (REQUIP) 1 MG tablet, Take 1 tablet (1 mg total) by mouth at bedtime. (Patient not taking: Reported on 03/21/2017), Disp: 90 tablet, Rfl: 3 .  sodium chloride (OCEAN) 0.65 % SOLN nasal spray, Place 1 spray into both nostrils daily., Disp: , Rfl:  .  tamsulosin (FLOMAX) 0.4 MG CAPS capsule, , Disp:  , Rfl: 0 .  traMADol (ULTRAM) 50 MG tablet, Take 50 mg by mouth as needed., Disp: , Rfl:  .  travoprost, benzalkonium, (TRAVATAN) 0.004 % ophthalmic solution, 1 drop at bedtime., Disp: , Rfl:  .  triazolam (HALCION) 0.25 MG tablet, Take 1 tablet by mouth at bedtime as needed., Disp: , Rfl:   Past Medical History: Past Medical History:  Diagnosis Date  . Abdominal pain    Bloating and gas  . Asthma   . Atelectasis, bilateral 02/10/11   Noted On CT Scan - Mild Dependent Atelectasis at the Lung Bases  . Barrett's esophagus   . Benign prostatic hypertrophy   . Bilateral lower extremity edema   . Chronic fatigue   . Congestion of throat    mouth breath at night, dry mouth  . Diverticulosis of colon (without mention of hemorrhage)   . Dysrhythmia    SKIPPED HEART BEAT SOMETIMES  . Environmental allergies   . Esophageal reflux   . Gastritis   . Gluten intolerance   . Hiatal hernia 02/10/11   Noted on CT Scan - Moderate Hiatal Hernia  . Hyperlipemia   . Hypertension   . Hypertension   . Lactose intolerance   . 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 Melanoma ([place])    lymphoma  . Microcytic anemia 04/20/2011   .  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 Osteoarthritis    hands/feet,knees, NECK, BACK  . Pain    LOWER BACK PAIN - DDD, SCOLIOSIS, BONE SPUR.  FINISHED THE 3RD EPIDURAL STEROID INJECTION 7/15 - DONE BY DR. Nelva Bush. - STILL HAVING BACK PAIN  . Periaortic lymphadenopathy 02/16/2011  . Personal history of colonic polyps 2004   hyperplastic Dr. Collene Mares  . Renal cyst 02/10/11    Noted on CT Scan - Bilateral Renal Cysts  . S/P radiation therapy 03/15/11 - 04/09/11   Abdominal/ Pelvic Tumor, 3600 cGy/20 Fractions  . Sleep disorder    DOES NOT SLEEP WELL    Tobacco Use: Social History   Tobacco Use  Smoking Status Former Smoker  . Packs/day: 1.00  . Years: 35.00  . Pack  years: 35.00  . Types: Pipe, Cigarettes  . Last attempt to quit: 02/23/1992  . Years since quitting: 25.1  Smokeless Tobacco Never Used  Tobacco Comment   quit 20 years ago    Labs: Recent Review Flowsheet Data    Labs for ITP Cardiac and Pulmonary Rehab Latest Ref Rng & Units 01/12/2007 08/09/2007 01/23/2008 12/23/2008 04/01/2014   Cholestrol 0 - 200 mg/dL 185 170 171 235(H) 188   LDLCALC 0 - 99 mg/dL 105(H) 98 102(H) - 103(H)   LDLDIRECT mg/dL - - - 165.7 -   HDL >=40 mg/dL 58.2 45.9 41.8 49.30 52   Trlycerides <150 mg/dL 108 131 135 126.0 166(H)      Capillary Blood Glucose: Lab Results  Component Value Date   GLUCAP 91 08/17/2011   GLUCAP 81 03/01/2011     Pulmonary Assessment Scores: Pulmonary Assessment Scores    Row Name 03/31/17 0743         ADL UCSD   ADL Phase  Entry       mMRC Score   mMRC Score  2        Pulmonary Function Assessment: Pulmonary Function Assessment - 03/21/17 1031      Breath   Bilateral Breath Sounds  Clear    Shortness of Breath  Yes;Limiting activity       Exercise Target Goals: Date: 03/31/17  Exercise Program Goal: Individual exercise prescription set using results from initial 6 min walk test and THRR while considering  patient's activity barriers and safety.    Exercise Prescription Goal: Initial exercise prescription builds to 30-45 minutes a day of aerobic activity, 2-3 days per week.  Home exercise guidelines will be given to patient during program as part of exercise prescription that the participant will acknowledge.  Activity Barriers & Risk Stratification: Activity Barriers & Cardiac Risk Stratification - 03/21/17 1042      Activity Barriers & Cardiac Risk Stratification   Activity Barriers  Arthritis;Back Problems       6 Minute Walk: 6 Minute Walk    Row Name 03/31/17 0741         6 Minute Walk   Phase  Initial     Distance  1200 feet     Walk Time  6 minutes     # of Rest Breaks  0     MPH  2.27      METS  2.68     RPE  12     Perceived Dyspnea   0     Symptoms  No     Resting HR  85 bpm     Resting BP  114/80  Resting Oxygen Saturation   92 %     Exercise Oxygen Saturation  during 6 min walk  87 %     Max Ex. HR  110 bpm     Max Ex. BP  159/115     2 Minute Post BP  133/91       Interval HR   1 Minute HR  93     2 Minute HR  98     3 Minute HR  86     4 Minute HR  90     5 Minute HR  110     6 Minute HR  104     Interval Heart Rate?  Yes       Interval Oxygen   Interval Oxygen?  Yes     Baseline Oxygen Saturation %  92 %     1 Minute Oxygen Saturation %  86 %     1 Minute Liters of Oxygen  0 L     2 Minute Oxygen Saturation %  88 %     2 Minute Liters of Oxygen  0 L     3 Minute Oxygen Saturation %  88 %     3 Minute Liters of Oxygen  0 L     4 Minute Oxygen Saturation %  88 %     4 Minute Liters of Oxygen  0 L     5 Minute Oxygen Saturation %  87 %     5 Minute Liters of Oxygen  0 L     6 Minute Oxygen Saturation %  87 %     6 Minute Liters of Oxygen  0 L        Oxygen Initial Assessment: Oxygen Initial Assessment - 03/21/17 1037      Home Oxygen   Home Oxygen Device  Home Concentrator    Sleep Oxygen Prescription  Continuous    Home Exercise Oxygen Prescription  None    Home at Rest Exercise Oxygen Prescription  None    Compliance with Home Oxygen Use  Yes      Intervention   Short Term Goals  To learn and exhibit compliance with exercise, home and travel O2 prescription    Long  Term Goals  Exhibits compliance with exercise, home and travel O2 prescription       Oxygen Re-Evaluation:   Oxygen Discharge (Final Oxygen Re-Evaluation):   Initial Exercise Prescription: Initial Exercise Prescription - 03/31/17 0700      Date of Initial Exercise RX and Referring Provider   Date  03/31/17    Referring Provider  Dr. Freddi Starr   Level  2    Watts  10    Minutes  17      NuStep   Level  2    SPM  80    Minutes  17    METs  1.5       Track   Laps  8    Minutes  17      Prescription Details   Frequency (times per week)  2    Duration  Progress to 45 minutes of aerobic exercise without signs/symptoms of physical distress      Intensity   THRR 40-80% of Max Heartrate  55-110    Ratings of Perceived Exertion  11-13    Perceived Dyspnea  0-4      Progression   Progression  Continue progressive overload as per policy  without signs/symptoms or physical distress.      Resistance Training   Training Prescription  Yes    Weight  orange    Reps  10-15       Perform Capillary Blood Glucose checks as needed.  Exercise Prescription Changes:   Exercise Comments:   Exercise Goals and Review:   Exercise Goals Re-Evaluation :   Discharge Exercise Prescription (Final Exercise Prescription Changes):   Nutrition:  Target Goals: Understanding of nutrition guidelines, daily intake of sodium <1529m, cholesterol <2072m calories 30% from fat and 7% or less from saturated fats, daily to have 5 or more servings of fruits and vegetables.  Biometrics: Pre Biometrics - 03/21/17 1044      Pre Biometrics   Grip Strength  26 kg        Nutrition Therapy Plan and Nutrition Goals:   Nutrition Assessments:   Nutrition Goals Re-Evaluation:   Nutrition Goals Discharge (Final Nutrition Goals Re-Evaluation):   Psychosocial: Target Goals: Acknowledge presence or absence of significant depression and/or stress, maximize coping skills, provide positive support system. Participant is able to verbalize types and ability to use techniques and skills needed for reducing stress and depression.  Initial Review & Psychosocial Screening: Initial Psych Review & Screening - 03/21/17 1049      Initial Review   Current issues with  None Identified      Family Dynamics   Good Support System?  Yes      Barriers   Psychosocial barriers to participate in program  There are no identifiable barriers or psychosocial needs.       Screening Interventions   Interventions  Encouraged to exercise       Quality of Life Scores:  Scores of 19 and below usually indicate a poorer quality of life in these areas.  A difference of  2-3 points is a clinically meaningful difference.  A difference of 2-3 points in the total score of the Quality of Life Index has been associated with significant improvement in overall quality of life, self-image, physical symptoms, and general health in studies assessing change in quality of life.   PHQ-9: Recent Review Flowsheet Data    [facility]   Decreased Interest 0   Down, Depressed, Hopeless 1   PHQ - 2 Score 1     Interpretation of Total Score  Total Score Depression Severity:  1-4 = Minimal depression, 5-9 = Mild depression, 10-14 = Moderate depression, 15-19 = Moderately severe depression, 20-27 = Severe depression   Psychosocial Evaluation and Intervention: Psychosocial Evaluation - 03/21/17 1051      Psychosocial Evaluation & Interventions   Interventions  Encouraged to exercise with the program and follow exercise prescription    Expected Outcomes  no barriers to participation in pulmonary rehab    Continue Psychosocial Services   No Follow up required       Psychosocial Re-Evaluation:   Psychosocial Discharge (Final Psychosocial Re-Evaluation):   Education: Education Goals: Education classes will be provided on a weekly basis, covering required topics. Participant will state understanding/return demonstration of topics presented.  Learning Barriers/Preferences: Learning Barriers/Preferences - 03/21/17 1029      Learning Barriers/Preferences   Learning Barriers  None    Learning Preferences  Computer/Internet;Skilled Demonstration;Verbal Instruction;Video;Written Material       Education Topics: Risk Factor Reduction:  -Group instruction that is supported by a PowerPoint presentation. Instructor discusses the definition of a risk  factor, different risk factors for pulmonary disease, and how the  heart and lungs work together.     Nutrition for Pulmonary Patient:  -Group instruction provided by PowerPoint slides, verbal discussion, and written materials to support subject matter. The instructor gives an explanation and review of healthy diet recommendations, which includes a discussion on weight management, recommendations for fruit and vegetable consumption, as well as protein, fluid, caffeine, fiber, sodium, sugar, and alcohol. Tips for eating when patients are short of breath are discussed.   Pursed Lip Breathing:  -Group instruction that is supported by demonstration and informational handouts. Instructor discusses the benefits of pursed lip and diaphragmatic breathing and detailed demonstration on how to preform both.     Oxygen Safety:  -Group instruction provided by PowerPoint, verbal discussion, and written material to support subject matter. There is an overview of "What is Oxygen" and "Why do we need it".  Instructor also reviews how to create a safe environment for oxygen use, the importance of using oxygen as prescribed, and the risks of noncompliance. There is a brief discussion on traveling with oxygen and resources the patient may utilize.   Oxygen Equipment:  -Group instruction provided by [facility] Staff utilizing handouts, written materials, and equipment demonstrations.   Signs and Symptoms:  -Group instruction provided by written material and verbal discussion to support subject matter. Warning signs and symptoms of infection, stroke, and heart attack are reviewed and when to call the physician/911 reinforced. Tips for preventing the spread of infection discussed.   Advanced Directives:  -Group instruction provided by verbal instruction and written material to support subject matter. Instructor reviews Advanced Directive laws and proper instruction for filling out document.   Pulmonary Video:   -Group video education that reviews the importance of medication and oxygen compliance, exercise, good nutrition, pulmonary hygiene, and pursed lip and diaphragmatic breathing for the pulmonary patient.   Exercise for the Pulmonary Patient:  -Group instruction that is supported by a PowerPoint presentation. Instructor discusses benefits of exercise, core components of exercise, frequency, duration, and intensity of an exercise routine, importance of utilizing pulse oximetry during exercise, safety while exercising, and options of places to exercise outside of rehab.     Pulmonary Medications:  -Verbally interactive group education provided by instructor with focus on inhaled medications and proper administration.   Anatomy and Physiology of the Respiratory System and Intimacy:  -Group instruction provided by PowerPoint, verbal discussion, and written material to support subject matter. Instructor reviews respiratory cycle and anatomical components of the respiratory system and their functions. Instructor also reviews differences in obstructive and restrictive respiratory diseases with examples of each. Intimacy, Sex, and Sexuality differences are reviewed with a discussion on how relationships can change when diagnosed with pulmonary disease. Common sexual concerns are reviewed.   MD DAY -A group question and answer session with a medical doctor that allows participants to ask questions that relate to their pulmonary disease state.   OTHER EDUCATION -Group or individual verbal, written, or video instructions that support the educational goals of the pulmonary rehab program.   Holiday Eating Survival Tips:  -Group instruction provided by PowerPoint slides, verbal discussion, and written materials to support subject matter. The instructor gives patients tips, tricks, and techniques to help them not only survive but enjoy the holidays despite the onslaught of food that accompanies the  holidays.   Knowledge Questionnaire Score:   Core Components/Risk Factors/Patient Goals at Admission: Personal Goals and Risk Factors at Admission - 03/21/17 1047      Core Components/Risk Factors/Patient Goals on Admission  Improve shortness of breath with ADL's  Yes    Intervention  Provide education, individualized exercise plan and daily activity instruction to help decrease symptoms of SOB with activities of daily living.    Expected Outcomes  Short Term: Improve cardiorespiratory fitness to achieve a reduction of symptoms when performing ADLs;Long Term: Be able to perform more ADLs without symptoms or delay the onset of symptoms       Core Components/Risk Factors/Patient Goals Review:    Core Components/Risk Factors/Patient Goals at Discharge (Final Review):    ITP Comments:   Comments:

## 2017-04-05 ENCOUNTER — Encounter (HOSPITAL_COMMUNITY)
Admission: RE | Admit: 2017-04-05 | Discharge: 2017-04-05 | Disposition: A | Discharge status: Home / Self Care | Payer: Medicare HMO | Source: Ambulatory Visit | Attending: Pulmonary Disease | Admitting: Pulmonary Disease

## 2017-04-05 VITALS — Wt 191.4 lb

## 2017-04-05 DIAGNOSIS — J8410 Pulmonary fibrosis, unspecified: Secondary | ICD-10-CM | POA: Insufficient documentation

## 2017-04-05 DIAGNOSIS — Z87891 Personal history of nicotine dependence: Secondary | ICD-10-CM | POA: Diagnosis not present

## 2017-04-05 NOTE — Progress Notes (Signed)
Daily Session Note  Patient Details  Name: Ryan [name] MRN: [id number] Date of Birth: 1931-08-29 Referring Provider:     Pulmonary Rehab Walk Test from 03/29/2017 in [place]  Referring Provider  Dr. Elsworth Soho      Encounter Date: 04/05/2017  Check In: Session Check In - 04/05/17 1030      Check-In   Location  [facility]    Staff Present  Rosebud Poles, RN, BSN;Laikyn Gewirtz, MS, ACSM RCEP, Exercise Physiologist;Lisa Ysidro Evert, RN;Portia Rollene Rotunda, RN, BSN    Supervising physician immediately available to respond to emergencies  [facility] immediately available    Physician(s)  Dr. Eliseo Squires    Medication changes reported      No    Fall or balance concerns reported     No    Tobacco Cessation  No Change    Warm-up and Cool-down  Performed as group-led instruction    Resistance Training Performed  Yes    VAD Patient?  No      Pain Assessment   Currently in Pain?  No/denies    Multiple Pain Sites  No       Capillary Blood Glucose: No results found for this or any previous visit (from the past 24 hour(s)).  Exercise Prescription Changes - 04/05/17 1200      Response to Exercise   Blood Pressure (Admit)  134/84    Blood Pressure (Exercise)  142/94    Blood Pressure (Exit)  140/80    Heart Rate (Admit)  74 bpm    Heart Rate (Exercise)  88 bpm    Heart Rate (Exit)  83 bpm    Oxygen Saturation (Admit)  91 %    Oxygen Saturation (Exercise)  88 %    Oxygen Saturation (Exit)  94 %    Rating of Perceived Exertion (Exercise)  13    Perceived Dyspnea (Exercise)  0    Duration  Continue with 45 min of aerobic exercise without signs/symptoms of physical distress.    Intensity  THRR unchanged      Resistance Training   Training Prescription  Yes    Weight  orange    Reps  10-15      NuStep   Level  --    SPM  --    Minutes  --    METs  --      Arm Ergometer   Level  2    Minutes  17      Track   Laps  9    Minutes   17       Social History   Tobacco Use  Smoking Status Former Smoker  . Packs/day: 1.00  . Years: 35.00  . Pack years: 35.00  . Types: Pipe, Cigarettes  . Last attempt to quit: 02/23/1992  . Years since quitting: 25.1  Smokeless Tobacco Never Used  Tobacco Comment   quit 20 years ago    Goals Met:  Exercise tolerated well No report of cardiac concerns or symptoms Strength training completed today  Goals Unmet:  Not Applicable  Comments: Service time is from 10:30a to 11:50p Patient left early because he stated he had "done enough" exercise.    Dr. Rush Farmer is Medical Director for Pulmonary Rehab at [facility].

## 2017-04-07 ENCOUNTER — Encounter (HOSPITAL_COMMUNITY)
Admission: RE | Admit: 2017-04-07 | Discharge: 2017-04-07 | Disposition: A | Discharge status: Home / Self Care | Payer: Medicare HMO | Source: Ambulatory Visit | Attending: Pulmonary Disease | Admitting: Pulmonary Disease

## 2017-04-07 VITALS — Wt 190.5 lb

## 2017-04-07 DIAGNOSIS — J8410 Pulmonary fibrosis, unspecified: Secondary | ICD-10-CM

## 2017-04-07 NOTE — Progress Notes (Signed)
Daily Session Note  Patient Details  Name: Ryan [name] MRN: [id number] Date of Birth: January 11, 1932 Referring Provider:     Pulmonary Rehab Walk Test from 03/29/2017 in [place]  Referring Provider  Dr. Elsworth Soho      Encounter Date: 04/07/2017  Check In: Session Check In - 04/07/17 1030      Check-In   Location  [facility]    Staff Present  Rosebud Poles, RN, BSN;Molly diVincenzo, MS, ACSM RCEP, Exercise Physiologist;Ceira Hoeschen Ysidro Evert, RN;Portia Rollene Rotunda, RN, BSN    Supervising physician immediately available to respond to emergencies  [facility] immediately available    Physician(s)  Dr. Horris Latino    Medication changes reported      No    Fall or balance concerns reported     No    Tobacco Cessation  No Change    Warm-up and Cool-down  Performed as group-led instruction    Resistance Training Performed  Yes    VAD Patient?  No      Pain Assessment   Currently in Pain?  No/denies    Multiple Pain Sites  No       Capillary Blood Glucose: No results found for this or any previous visit (from the past 24 hour(s)).    Social History   Tobacco Use  Smoking Status Former Smoker  . Packs/day: 1.00  . Years: 35.00  . Pack years: 35.00  . Types: Pipe, Cigarettes  . Last attempt to quit: 02/23/1992  . Years since quitting: 25.1  Smokeless Tobacco Never Used  Tobacco Comment   quit 20 years ago    Goals Met:  Exercise tolerated well No report of cardiac concerns or symptoms Strength training completed today  Goals Unmet:  Not Applicable  Comments: Service time is from 1030 to 1215    Dr. Rush Farmer is Medical Director for Pulmonary Rehab at [facility].

## 2017-04-09 ENCOUNTER — Ambulatory Visit (INDEPENDENT_AMBULATORY_CARE_PROVIDER_SITE_OTHER): Payer: Medicare HMO

## 2017-04-09 ENCOUNTER — Ambulatory Visit (HOSPITAL_COMMUNITY)
Admission: EM | Admit: 2017-04-09 | Discharge: 2017-04-09 | Disposition: A | Discharge status: Home / Self Care | Payer: Medicare HMO | Attending: Family Medicine | Admitting: Family Medicine

## 2017-04-09 ENCOUNTER — Encounter (HOSPITAL_COMMUNITY): Payer: Self-pay | Admitting: *Deleted

## 2017-04-09 DIAGNOSIS — M19071 Primary osteoarthritis, right ankle and foot: Secondary | ICD-10-CM

## 2017-04-09 MED ORDER — PREDNISONE 20 MG PO TABS
ORAL_TABLET | ORAL | Status: AC
  Filled 2017-04-09: qty 3

## 2017-04-09 MED ORDER — PREDNISONE 10 MG (21) PO TBPK: ORAL_TABLET | Freq: Every day | ORAL | 0 refills | Status: DC

## 2017-04-09 MED ORDER — PREDNISONE 20 MG PO TABS
60.0000 mg | ORAL_TABLET | ORAL | Status: AC
  Administered 2017-04-09: 60 mg via ORAL

## 2017-04-09 NOTE — Discharge Instructions (Signed)
We will treat this inflammatory process with prednisone at this time. May start course tomorrow as we have given you first dose here today. Ice and elevation. Activity as tolerated. Please follow up with your primary care doctor and/or your podiatrist next week for recheck. If worsening of pain, fevers, redness, swelling develop please return to be seen or go to Er.

## 2017-04-09 NOTE — ED Provider Notes (Signed)
[place]    CSN: [id number] Arrival date & time: 04/09/17  1204     History   Chief Complaint No chief complaint on file.   HPI Ryan [name] is a 84 y.o. male.   Ryan [name] presents with complaints of right foot pain, primarily to great toe, which started two days ago and has been worsening. Denies any previous similar. No known injury or trauma. No open skin or lesions. Pain is severe in nature, is constant but worse with weight bearing. Has been soaking the toe in epsom salt which has not helped. He states he has a history of neuropathy but without any numbness or tingling to foot. Without fevers. History of pedal edema, takes lasix for this. Hx of asthma, bph, htn, pulmonary fibrosis, pedal edema, oa. Without history of DM.    ROS per HPI.       Past Medical History:  Diagnosis Date  . Abdominal pain    Bloating and gas  . Asthma   . Atelectasis, bilateral 02/10/11   Noted On CT Scan - Mild Dependent Atelectasis at the Lung Bases  . Barrett's esophagus   . Benign prostatic hypertrophy   . Bilateral lower extremity edema   . Chronic fatigue   . Congestion of throat    mouth breath at night, dry mouth  . Diverticulosis of colon (without mention of hemorrhage)   . Dysrhythmia    SKIPPED HEART BEAT SOMETIMES  . Environmental allergies   . Esophageal reflux   . Gastritis   . Gluten intolerance   . Hiatal hernia 02/10/11   Noted on CT Scan - Moderate Hiatal Hernia  . Hyperlipemia   . Hypertension   . Hypertension   . Lactose intolerance   . 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 Melanoma ([place])    lymphoma  . Microcytic anemia 04/20/2011   . 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 Osteoarthritis    hands/feet,knees, NECK, BACK  . Pain    LOWER BACK PAIN - DDD, SCOLIOSIS, BONE SPUR.  FINISHED THE 3RD EPIDURAL STEROID INJECTION 7/15 - DONE BY DR.  Nelva Bush. - STILL HAVING BACK PAIN  . Periaortic lymphadenopathy 02/16/2011  . Personal history of colonic polyps 2004   hyperplastic Dr. Collene Mares  . Renal cyst 02/10/11    Noted on CT Scan - Bilateral Renal Cysts  . S/P radiation therapy 03/15/11 - 04/09/11   Abdominal/ Pelvic Tumor, 3600 cGy/20 Fractions  . Sleep disorder    DOES NOT SLEEP WELL    Patient Active Problem List   Diagnosis Date Noted  . Bilateral lower extremity edema 02/02/2017  . Pulmonary fibrosis ([place]) 11/22/2016  . Blurry vision, bilateral 10/26/2016  . COPD ([place]) 07/05/2016  . Exertional dyspnea 09/10/2015  . Fatigue/malaise 08/19/2015  . Asthma/COPD 02/12/2015  . 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 Acute diastolic CHF (congestive heart failure) ([place]) 01/06/2014  . Hypoxemia 01/05/2014  . Essential hypertension 01/05/2014  . Hypokalemia 01/05/2014  . Urinary retention 01/05/2014  . Hypoxia   . Lumbar radiculopathy 12/31/2013  . RLS (restless legs syndrome) 09/21/2012  . Chronic lower back pain 06/15/2012  . Abdominal pain, unspecified site 05/19/2012  . Unspecified deficiency anemia 04/07/2012  . Melanoma ([place])   . Non Hodgkin's lymphoma ([place])   . Periaortic lymphadenopathy 02/16/2011  . Barrett's esophagus 07/09/2010  . Personal history of colonic polyps 05/28/2010  .  History of IBS 05/28/2010  . Diverticulosis of colon (without mention of hemorrhage) 05/28/2010  . Arthritis involving multiple sites 05/28/2010  . Wheat intolerance 05/28/2010  . Chronic venous insufficiency 08/19/2009  . PEDAL EDEMA 08/19/2009  . INSOMNIA, CHRONIC 12/24/2008  . EUSTACHIAN TUBE DYSFUNCTION, BILATERAL 12/23/2008  . ERECTILE DYSFUNCTION, ORGANIC 08/01/2008  . Allergic rhinitis with a nonallergic component 05/20/2008  . VENTRAL HERNIA 02/12/2008  . PALPITATIONS 12/25/2007  . LACTOSE INTOLERANCE 10/17/2007  . HYPERLIPIDEMIA 09/14/2006  . HYPERTENSION 09/14/2006  . BPH (benign prostatic hyperplasia) 09/14/2006  .  OSTEOARTHRITIS 09/14/2006  . HEART MURMUR, HX OF 09/14/2006    Past Surgical History:  Procedure Laterality Date  . ARTHROSCOPIC REPAIR ACL     right  . BONE MARROW ASPIRATION  02/25/11   Bone Marrow, Aspirate, Clot, and Bilateral Bx, Right PIC  . CATARACT EXTRACTION, BILATERAL    . EYE SURGERY    . HERNIA REPAIR     LEFT INGUINAL   . INSERTION OF MESH N/A 08/23/2012   Procedure: INSERTION OF MESH;  Surgeon: Madilyn Hook, DO;  Location: [facility];  Service: General;  Laterality: N/A;  . LUMBAR LAMINECTOMY/DECOMPRESSION MICRODISCECTOMY Right 12/31/2013   Procedure: RIGHT L4-5 L5-S1 LAMINECTOMY;  Surgeon: Kristeen Miss, MD;  Location: [facility];  Service: Neurosurgery;  Laterality: Right;  RIGHT L4-5 L5-S1 LAMINECTOMY  . ROTATOR CUFF REPAIR     right  . TONSILLECTOMY    . TONSILLECTOMY    . VENTRAL HERNIA REPAIR N/A 08/23/2012   Procedure: HERNIA REPAIR VENTRAL ADULT;  Surgeon: Madilyn Hook, DO;  Location: [facility];  Service: General;  Laterality: N/A;       Home Medications    Prior to Admission medications   Medication Sig Start Date End Date Taking? Authorizing Provider  ALBUTEROL SULFATE HFA IN Inhale into the lungs as needed.    [provider]  ALPRAZolam Duanne Moron) 0.25 MG tablet Take 0.125-0.25 mg by mouth 2 (two) times daily as needed for anxiety. Reported on 08/19/2015    [provider]  budesonide-formoterol (SYMBICORT) 160-4.5 MCG/ACT inhaler Inhale 2 puffs into the lungs 2 (two) times daily. Patient not taking: Reported on 03/21/2017 02/08/17   Bobbitt, Sed[place] Muta, MD  finasteride (PROSCAR) 5 MG tablet Take 1 tablet (5 mg total) by mouth daily. 12/25/12   Norins, Heinz Knuckles, MD  furosemide (LASIX) 40 MG tablet Take 80 mg by mouth 2 (two) times daily.    [provider]  latanoprost (XALATAN) 0.005 % ophthalmic solution 1 drop 2 (two) times daily.    [provider]  Lutein-Zeaxanthin 6-0.24 MG CAPS Take by mouth.    [provider]    omeprazole (PRILOSEC) 20 MG capsule Take 20 mg by mouth daily.    [provider]  potassium chloride SA (K-DUR,KLOR-CON) 20 MEQ tablet Take 40 mEq by mouth 2 (two) times daily.     [provider]  pramipexole (MIRAPEX) 0.75 MG tablet Take 0.75 mg by mouth at bedtime.    [provider]  predniSONE (STERAPRED UNI-PAK 21 TAB) 10 MG (21) TBPK tablet Take by mouth daily. Take 6 tabs by mouth daily  for 2 days, then 5 tabs for 2 days, then 4 tabs for 2 days, then 3 tabs for 2 days, 2 tabs for 2 days, then 1 tab by mouth daily for 2 days 04/09/17   Augusto Gamble B, NP  rOPINIRole (REQUIP) 1 MG tablet Take 1 tablet (1 mg total) by mouth at bedtime. Patient not  taking: Reported on 03/21/2017 02/12/13   Norins, Heinz Knuckles, MD  sodium chloride (OCEAN) 0.65 % SOLN nasal spray Place 1 spray into both nostrils daily.    [provider]  tamsulosin (FLOMAX) 0.4 MG CAPS capsule  08/22/15   [provider]  traMADol (ULTRAM) 50 MG tablet Take 50 mg by mouth as needed.    [provider]  travoprost, benzalkonium, (TRAVATAN) 0.004 % ophthalmic solution 1 drop at bedtime.    [provider]  triazolam (HALCION) 0.25 MG tablet Take 1 tablet by mouth at bedtime as needed. 02/25/14   [provider]    Family History Family History  Problem Relation Age of Onset  . Heart disease Father        MI 25  . Urticaria Father   . Prostate cancer Brother   . Prostate cancer Paternal Uncle   . Prostate cancer Paternal Uncle   . Prostate cancer Paternal Uncle   . Hyperlipidemia Unknown   . Stroke Unknown   . Hypertension Unknown   . ADD / ADHD Unknown   . Colon cancer Neg Hx   . Allergic rhinitis Neg Hx   . Asthma Neg Hx   . Eczema Neg Hx     Social History Social History   Tobacco Use  . Smoking status: Former Smoker    Packs/day: 1.00    Years: 35.00    Pack years: 35.00    Types: Pipe, Cigarettes    Last attempt to quit: 02/23/1992     Years since quitting: 25.1  . Smokeless tobacco: Never Used  . Tobacco comment: quit 20 years ago  Substance Use Topics  . Alcohol use: Yes    Comment: 2 drinks daily scotch  AND WINE WITH SUPPER  . Drug use: No     Allergies   Pollen extract-tree extract and Molds & smuts   Review of Systems Review of Systems   Physical Exam Triage Vital Signs ED Triage Vitals  Enc Vitals Group     BP 04/09/17 1231 (!) 158/93     Pulse Rate 04/09/17 1231 93     Resp 04/09/17 1231 20     Temp 04/09/17 1231 98.3 F (36.8 C)     Temp Source 04/09/17 1231 Oral     SpO2 04/09/17 1231 93 %     Weight --      Height --      Head Circumference --      Peak Flow --      Pain Score 04/09/17 1243 8     Pain Loc --      Pain Edu? --      Excl. in [place]? --    No data found.  Updated Vital Signs BP (!) 158/93 (BP Location: Right Arm) Comment: [place] in room  Pulse 93   Temp 98.3 F (36.8 C) (Oral)   Resp 20   SpO2 93%   Visual Acuity Right Eye Distance:   Left Eye Distance:   Bilateral Distance:    Right Eye Near:   Left Eye Near:    Bilateral Near:     Physical Exam  Constitutional: He is oriented to person, place, and time. He appears well-developed and well-nourished.  Cardiovascular: Normal rate and regular rhythm.  Pulmonary/Chest: Effort normal and breath sounds normal.  Musculoskeletal:       Right foot: There is decreased range of motion, tenderness, bony tenderness and swelling. There is normal capillary refill, no crepitus, no deformity  and no laceration.       Feet:  Tenderness primarily to right great toe MTP joint; red; without warm; see photo; bilateral lower extremity edema approximately 1+ to ankles; sensation intact and strong palpable pedal pulse; without skin breakdown or open lesions to foot   Neurological: He is alert and oriented to person, place, and time.  Skin: Skin is warm and dry.       UC Treatments / Results  Labs (all labs ordered are listed,  but only abnormal results are displayed) Labs Reviewed - No data to display  EKG  EKG Interpretation None       Radiology Dg Foot Complete Right  Result Date: 04/09/2017 CLINICAL DATA:  Swelling and pain and right foot for 3 days. No known injury. EXAM: RIGHT FOOT COMPLETE - 3+ VIEW COMPARISON:  None. FINDINGS: Advanced degenerative change at the first MTP joint, degenerative osteoarthritis versus inflammatory osteoarthritis. No acute or suspicious osseous lesion. No fracture line or displaced fracture fragment. No destructive change to suggest osteomyelitis. Soft tissue swelling, particularly prominent over the dorsum of the metatarsal bones. IMPRESSION: 1. Soft tissue swelling.  No soft tissue gas. 2. Degenerative changes at the first MTP joint, at least moderate in degree, appearance compatible with degenerative osteoarthritis versus inflammatory arthritis, possibly chronic erosive osteoarthritis. Electronically Signed   By: Franki Cabot M.D.   On: 04/09/2017 13:21    Procedures Procedures (including critical care time)  Medications Ordered in UC Medications  predniSONE (DELTASONE) tablet 60 mg (not administered)     Initial Impression / Assessment and Plan / UC Course  I have reviewed the triage vital signs and the nursing notes.  Pertinent labs & imaging results that were available during my care of the patient were reviewed by me and considered in my medical decision making (see chart for details).     Obvious OA on xray at primary area of discomfort. Prednisone course provided at this time. Without open skin, warm, fevers or other sources of infection at this time. Gout discussed as well, without history of gout. Return precautions provided. If symptoms worsen or do not improve in the next week to return to be seen or to follow up with PCP.  Patient verbalized understanding and agreeable to plan.  Use of walker to ambulate out of UC.   Final Clinical Impressions(s) / UC  Diagnoses   Final diagnoses:  Osteoarthritis of right foot, unspecified osteoarthritis type    ED Discharge Orders        Ordered    predniSONE (STERAPRED UNI-PAK 21 TAB) 10 MG (21) TBPK tablet  Daily     04/09/17 1327       Controlled Substance Prescriptions [place] Controlled Substance Registry consulted? Not Applicable   Zigmund Gottron, NP 04/09/17 1334

## 2017-04-11 NOTE — Progress Notes (Signed)
Pulmonary Individual Treatment Plan  Patient Details  Name: Ryan [name] MRN: [id number] Date of Birth: Apr 17, 1931 Referring Provider:     Pulmonary Rehab Walk Test from 03/29/2017 in [place]  Referring Provider  Dr. Elsworth Soho      Initial Encounter Date:    Pulmonary Rehab Walk Test from 03/29/2017 in [place]  Date  03/31/17  Referring Provider  Dr. Elsworth Soho      Visit Diagnosis: Pulmonary fibrosis ([place])  Patient's Home Medications on Admission:   Current Outpatient Medications:  .  ALBUTEROL SULFATE HFA IN, Inhale into the lungs as needed., Disp: , Rfl:  .  ALPRAZolam (XANAX) 0.25 MG tablet, Take 0.125-0.25 mg by mouth 2 (two) times daily as needed for anxiety. Reported on 08/19/2015, Disp: , Rfl:  .  budesonide-formoterol (SYMBICORT) 160-4.5 MCG/ACT inhaler, Inhale 2 puffs into the lungs 2 (two) times daily. (Patient not taking: Reported on 03/21/2017), Disp: 1 Inhaler, Rfl: 5 .  finasteride (PROSCAR) 5 MG tablet, Take 1 tablet (5 mg total) by mouth daily., Disp: 30 tablet, Rfl: 5 .  furosemide (LASIX) 40 MG tablet, Take 80 mg by mouth 2 (two) times daily., Disp: , Rfl:  .  latanoprost (XALATAN) 0.005 % ophthalmic solution, 1 drop 2 (two) times daily., Disp: , Rfl:  .  Lutein-Zeaxanthin 6-0.24 MG CAPS, Take by mouth., Disp: , Rfl:  .  omeprazole (PRILOSEC) 20 MG capsule, Take 20 mg by mouth daily., Disp: , Rfl:  .  potassium chloride SA (K-DUR,KLOR-CON) 20 MEQ tablet, Take 40 mEq by mouth 2 (two) times daily. , Disp: , Rfl:  .  pramipexole (MIRAPEX) 0.75 MG tablet, Take 0.75 mg by mouth at bedtime., Disp: , Rfl:  .  predniSONE (STERAPRED UNI-PAK 21 TAB) 10 MG (21) TBPK tablet, Take by mouth daily. Take 6 tabs by mouth daily  for 2 days, then 5 tabs for 2 days, then 4 tabs for 2 days, then 3 tabs for 2 days, 2 tabs for 2 days, then 1 tab by mouth daily for 2 days, Disp: 42 tablet, Rfl: 0 .  rOPINIRole (REQUIP) 1 MG  tablet, Take 1 tablet (1 mg total) by mouth at bedtime. (Patient not taking: Reported on 03/21/2017), Disp: 90 tablet, Rfl: 3 .  sodium chloride (OCEAN) 0.65 % SOLN nasal spray, Place 1 spray into both nostrils daily., Disp: , Rfl:  .  tamsulosin (FLOMAX) 0.4 MG CAPS capsule, , Disp: , Rfl: 0 .  traMADol (ULTRAM) 50 MG tablet, Take 50 mg by mouth as needed., Disp: , Rfl:  .  travoprost, benzalkonium, (TRAVATAN) 0.004 % ophthalmic solution, 1 drop at bedtime., Disp: , Rfl:  .  triazolam (HALCION) 0.25 MG tablet, Take 1 tablet by mouth at bedtime as needed., Disp: , Rfl:   Past Medical History: Past Medical History:  Diagnosis Date  . Abdominal pain    Bloating and gas  . Asthma   . Atelectasis, bilateral 02/10/11   Noted On CT Scan - Mild Dependent Atelectasis at the Lung Bases  . Barrett's esophagus   . Benign prostatic hypertrophy   . Bilateral lower extremity edema   . Chronic fatigue   . Congestion of throat    mouth breath at night, dry mouth  . Diverticulosis of colon (without mention of hemorrhage)   . Dysrhythmia    SKIPPED HEART BEAT SOMETIMES  . Environmental allergies   . Esophageal reflux   . Gastritis   . Gluten intolerance   .  Hiatal hernia 02/10/11   Noted on CT Scan - Moderate Hiatal Hernia  . Hyperlipemia   . Hypertension   . Hypertension   . Lactose intolerance   . 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 Melanoma ([place])    lymphoma  . Microcytic anemia 04/20/2011   . 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 Osteoarthritis    hands/feet,knees, NECK, BACK  . Pain    LOWER BACK PAIN - DDD, SCOLIOSIS, BONE SPUR.  FINISHED THE 3RD EPIDURAL STEROID INJECTION 7/15 - DONE BY DR. Nelva Bush. - STILL HAVING BACK PAIN  . Periaortic lymphadenopathy 02/16/2011  . Personal history of colonic polyps 2004   hyperplastic Dr. Collene Mares  . Renal cyst 02/10/11    Noted on CT Scan -  Bilateral Renal Cysts  . S/P radiation therapy 03/15/11 - 04/09/11   Abdominal/ Pelvic Tumor, 3600 cGy/20 Fractions  . Sleep disorder    DOES NOT SLEEP WELL    Tobacco Use: Social History   Tobacco Use  Smoking Status Former Smoker  . Packs/day: 1.00  . Years: 35.00  . Pack years: 35.00  . Types: Pipe, Cigarettes  . Last attempt to quit: 02/23/1992  . Years since quitting: 25.1  Smokeless Tobacco Never Used  Tobacco Comment   quit 20 years ago    Labs: Recent Review Flowsheet Data    Labs for ITP Cardiac and Pulmonary Rehab Latest Ref Rng & Units 01/12/2007 08/09/2007 01/23/2008 12/23/2008 04/01/2014   Cholestrol 0 - 200 mg/dL 185 170 171 235(H) 188   LDLCALC 0 - 99 mg/dL 105(H) 98 102(H) - 103(H)   LDLDIRECT mg/dL - - - 165.7 -   HDL >=40 mg/dL 58.2 45.9 41.8 49.30 52   Trlycerides <150 mg/dL 108 131 135 126.0 166(H)      Capillary Blood Glucose: Lab Results  Component Value Date   GLUCAP 91 08/17/2011   GLUCAP 81 03/01/2011     Pulmonary Assessment Scores: Pulmonary Assessment Scores    Row Name 03/31/17 0743         ADL UCSD   ADL Phase  Entry       mMRC Score   mMRC Score  2        Pulmonary Function Assessment: Pulmonary Function Assessment - 03/21/17 1031      Breath   Bilateral Breath Sounds  Clear    Shortness of Breath  Yes;Limiting activity       Exercise Target Goals:    Exercise Program Goal: Individual exercise prescription set using results from initial 6 min walk test and THRR while considering  patient's activity barriers and safety.    Exercise Prescription Goal: Initial exercise prescription builds to 30-45 minutes a day of aerobic activity, 2-3 days per week.  Home exercise guidelines will be given to patient during program as part of exercise prescription that the participant will acknowledge.  Activity Barriers & Risk Stratification: Activity Barriers & Cardiac Risk Stratification - 03/21/17 1042      Activity Barriers &  Cardiac Risk Stratification   Activity Barriers  Arthritis;Back Problems       6 Minute Walk: 6 Minute Walk    Row Name 03/31/17 0741         6 Minute Walk   Phase  Initial     Distance  1200 feet     Walk Time  6 minutes     #  of Rest Breaks  0     MPH  2.27     METS  2.68     RPE  12     Perceived Dyspnea   0     Symptoms  No     Resting HR  85 bpm     Resting BP  114/80     Resting Oxygen Saturation   92 %     Exercise Oxygen Saturation  during 6 min walk  87 %     Max Ex. HR  110 bpm     Max Ex. BP  159/115     2 Minute Post BP  133/91       Interval HR   1 Minute HR  93     2 Minute HR  98     3 Minute HR  86     4 Minute HR  90     5 Minute HR  110     6 Minute HR  104     Interval Heart Rate?  Yes       Interval Oxygen   Interval Oxygen?  Yes     Baseline Oxygen Saturation %  92 %     1 Minute Oxygen Saturation %  86 %     1 Minute Liters of Oxygen  0 L     2 Minute Oxygen Saturation %  88 %     2 Minute Liters of Oxygen  0 L     3 Minute Oxygen Saturation %  88 %     3 Minute Liters of Oxygen  0 L     4 Minute Oxygen Saturation %  88 %     4 Minute Liters of Oxygen  0 L     5 Minute Oxygen Saturation %  87 %     5 Minute Liters of Oxygen  0 L     6 Minute Oxygen Saturation %  87 %     6 Minute Liters of Oxygen  0 L        Oxygen Initial Assessment: Oxygen Initial Assessment - 03/31/17 0802      Initial 6 min Walk   Oxygen Used  None      Program Oxygen Prescription   Program Oxygen Prescription  None       Oxygen Re-Evaluation: Oxygen Re-Evaluation    Row Name 04/11/17 1201             Program Oxygen Prescription   Program Oxygen Prescription  None         Home Oxygen   Home Oxygen Device  Home Concentrator       Sleep Oxygen Prescription  Continuous       Home Exercise Oxygen Prescription  None       Home at Rest Exercise Oxygen Prescription  None       Compliance with Home Oxygen Use  Yes         Goals/Expected Outcomes    Short Term Goals  To learn and exhibit compliance with exercise, home and travel O2 prescription       Long  Term Goals  Exhibits compliance with exercise, home and travel O2 prescription          Oxygen Discharge (Final Oxygen Re-Evaluation): Oxygen Re-Evaluation - 04/11/17 1201      Program Oxygen Prescription   Program Oxygen Prescription  None      Home Oxygen   Home Oxygen Device  Home Concentrator    Sleep Oxygen Prescription  Continuous    Home Exercise Oxygen Prescription  None    Home at Rest Exercise Oxygen Prescription  None    Compliance with Home Oxygen Use  Yes      Goals/Expected Outcomes   Short Term Goals  To learn and exhibit compliance with exercise, home and travel O2 prescription    Long  Term Goals  Exhibits compliance with exercise, home and travel O2 prescription       Initial Exercise Prescription: Initial Exercise Prescription - 03/31/17 0700      Date of Initial Exercise RX and Referring Provider   Date  03/31/17    Referring Provider  Dr. Freddi Starr   Level  2    Watts  10    Minutes  17      NuStep   Level  2    SPM  80    Minutes  17    METs  1.5      Track   Laps  8    Minutes  17      Prescription Details   Frequency (times per week)  2    Duration  Progress to 45 minutes of aerobic exercise without signs/symptoms of physical distress      Intensity   THRR 40-80% of Max Heartrate  55-110    Ratings of Perceived Exertion  11-13    Perceived Dyspnea  0-4      Progression   Progression  Continue progressive overload as per policy without signs/symptoms or physical distress.      Resistance Training   Training Prescription  Yes    Weight  orange    Reps  10-15       Perform Capillary Blood Glucose checks as needed.  Exercise Prescription Changes: Exercise Prescription Changes    Row Name 04/05/17 1200             Response to Exercise   Blood Pressure (Admit)  134/84       Blood Pressure (Exercise)  142/94        Blood Pressure (Exit)  140/80       Heart Rate (Admit)  74 bpm       Heart Rate (Exercise)  88 bpm       Heart Rate (Exit)  83 bpm       Oxygen Saturation (Admit)  91 %       Oxygen Saturation (Exercise)  88 %       Oxygen Saturation (Exit)  94 %       Rating of Perceived Exertion (Exercise)  13       Perceived Dyspnea (Exercise)  0       Duration  Continue with 45 min of aerobic exercise without signs/symptoms of physical distress.       Intensity  THRR unchanged         Resistance Training   Training Prescription  Yes       Weight  orange       Reps  10-15         NuStep   Level  -       SPM  -       Minutes  -       METs  -         Arm Ergometer   Level  2       Minutes  17  Track   Laps  9       Minutes  17          Exercise Comments:   Exercise Goals and Review:   Exercise Goals Re-Evaluation : Exercise Goals Re-Evaluation    [place] Name 04/11/17 1201             Exercise Goal Re-Evaluation   Exercise Goals Review  Increase Strength and Stamina;Able to understand and use Dyspnea scale;Increase Physical Activity;Able to understand and use rate of perceived exertion (RPE) scale;Knowledge and understanding of Target Heart Rate Range (THRR);Understanding of Exercise Prescription       Comments  Patient has only attended two rehab sessions. Will cont. to monitor and motivate as tolerated.       Expected Outcomes  Through exercise at home and at rehab, patient will increase strength and stamina and ADL's will be easier to perform.           Discharge Exercise Prescription (Final Exercise Prescription Changes): Exercise Prescription Changes - 04/05/17 1200      Response to Exercise   Blood Pressure (Admit)  134/84    Blood Pressure (Exercise)  142/94    Blood Pressure (Exit)  140/80    Heart Rate (Admit)  74 bpm    Heart Rate (Exercise)  88 bpm    Heart Rate (Exit)  83 bpm    Oxygen Saturation (Admit)  91 %    Oxygen Saturation (Exercise)  88 %     Oxygen Saturation (Exit)  94 %    Rating of Perceived Exertion (Exercise)  13    Perceived Dyspnea (Exercise)  0    Duration  Continue with 45 min of aerobic exercise without signs/symptoms of physical distress.    Intensity  THRR unchanged      Resistance Training   Training Prescription  Yes    Weight  orange    Reps  10-15      NuStep   Level  --    SPM  --    Minutes  --    METs  --      Arm Ergometer   Level  2    Minutes  17      Track   Laps  9    Minutes  17       Nutrition:  Target Goals: Understanding of nutrition guidelines, daily intake of sodium <1573m, cholesterol <2071m calories 30% from fat and 7% or less from saturated fats, daily to have 5 or more servings of fruits and vegetables.  Biometrics: Pre Biometrics - 03/21/17 1044      Pre Biometrics   Grip Strength  26 kg        Nutrition Therapy Plan and Nutrition Goals:   Nutrition Assessments:   Nutrition Goals Re-Evaluation:   Nutrition Goals Discharge (Final Nutrition Goals Re-Evaluation):   Psychosocial: Target Goals: Acknowledge presence or absence of significant depression and/or stress, maximize coping skills, provide positive support system. Participant is able to verbalize types and ability to use techniques and skills needed for reducing stress and depression.  Initial Review & Psychosocial Screening: Initial Psych Review & Screening - 03/21/17 1049      Initial Review   Current issues with  None Identified      Family Dynamics   Good Support System?  Yes      Barriers   Psychosocial barriers to participate in program  There are no identifiable barriers or psychosocial needs.  Screening Interventions   Interventions  Encouraged to exercise       Quality of Life Scores:  Scores of 19 and below usually indicate a poorer quality of life in these areas.  A difference of  2-3 points is a clinically meaningful difference.  A difference of 2-3 points in the total  score of the Quality of Life Index has been associated with significant improvement in overall quality of life, self-image, physical symptoms, and general health in studies assessing change in quality of life.   PHQ-9: Recent Review Flowsheet Data    Depression screen [facility] 2/9 03/21/2017   Decreased Interest 0   Down, Depressed, Hopeless 1   PHQ - 2 Score 1     Interpretation of Total Score  Total Score Depression Severity:  1-4 = Minimal depression, 5-9 = Mild depression, 10-14 = Moderate depression, 15-19 = Moderately severe depression, 20-27 = Severe depression   Psychosocial Evaluation and Intervention: Psychosocial Evaluation - 03/21/17 1051      Psychosocial Evaluation & Interventions   Interventions  Encouraged to exercise with the program and follow exercise prescription    Expected Outcomes  no barriers to participation in pulmonary rehab    Continue Psychosocial Services   No Follow up required       Psychosocial Re-Evaluation: Psychosocial Re-Evaluation    [place] Name 04/11/17 0929             Psychosocial Re-Evaluation   Comments  there may be unidentified psychosocial issues. patient seems unwilling to follow class "schedule". at both exercise sessions he has decided that he has "had enough for one day" and requests to be discharged home.  although treatment plans are individualized patient re-educated that this is group exercise and class is scheduled as a beginning and an end. patient seems unwilling to "hear" or understand when explanation is give. will continue to monitor. patient also arrived for an exercise session on Wednesday although pulmonary rehab is only scheduled on Tuesdays and Thursdays.       Expected Outcomes  patient will remain free from psychosocial barriers to participation in pulmonary rehab       Interventions  Encouraged to attend Pulmonary Rehabilitation for the exercise       Continue Psychosocial Services   Follow up required by staff           Psychosocial Discharge (Final Psychosocial Re-Evaluation): Psychosocial Re-Evaluation - 04/11/17 0929      Psychosocial Re-Evaluation   Comments  there may be unidentified psychosocial issues. patient seems unwilling to follow class "schedule". at both exercise sessions he has decided that he has "had enough for one day" and requests to be discharged home.  although treatment plans are individualized patient re-educated that this is group exercise and class is scheduled as a beginning and an end. patient seems unwilling to "hear" or understand when explanation is give. will continue to monitor. patient also arrived for an exercise session on Wednesday although pulmonary rehab is only scheduled on Tuesdays and Thursdays.    Expected Outcomes  patient will remain free from psychosocial barriers to participation in pulmonary rehab    Interventions  Encouraged to attend Pulmonary Rehabilitation for the exercise    Continue Psychosocial Services   Follow up required by staff       Education: Education Goals: Education classes will be provided on a weekly basis, covering required topics. Participant will state understanding/return demonstration of topics presented.  Learning Barriers/Preferences: Learning Barriers/Preferences - 03/21/17 1029  Learning Barriers/Preferences   Learning Barriers  None    Learning Preferences  Computer/Internet;Skilled Demonstration;Verbal Instruction;Video;Written Material       Education Topics: Risk Factor Reduction:  -Group instruction that is supported by a PowerPoint presentation. Instructor discusses the definition of a risk factor, different risk factors for pulmonary disease, and how the heart and lungs work together.     Nutrition for Pulmonary Patient:  -Group instruction provided by PowerPoint slides, verbal discussion, and written materials to support subject matter. The instructor gives an explanation and review of healthy diet  recommendations, which includes a discussion on weight management, recommendations for fruit and vegetable consumption, as well as protein, fluid, caffeine, fiber, sodium, sugar, and alcohol. Tips for eating when patients are short of breath are discussed.   Pursed Lip Breathing:  -Group instruction that is supported by demonstration and informational handouts. Instructor discusses the benefits of pursed lip and diaphragmatic breathing and detailed demonstration on how to preform both.     Oxygen Safety:  -Group instruction provided by PowerPoint, verbal discussion, and written material to support subject matter. There is an overview of "What is Oxygen" and "Why do we need it".  Instructor also reviews how to create a safe environment for oxygen use, the importance of using oxygen as prescribed, and the risks of noncompliance. There is a brief discussion on traveling with oxygen and resources the patient may utilize.   Oxygen Equipment:  -Group instruction provided by [facility] Staff utilizing handouts, written materials, and equipment demonstrations.   Signs and Symptoms:  -Group instruction provided by written material and verbal discussion to support subject matter. Warning signs and symptoms of infection, stroke, and heart attack are reviewed and when to call the physician/911 reinforced. Tips for preventing the spread of infection discussed.   Advanced Directives:  -Group instruction provided by verbal instruction and written material to support subject matter. Instructor reviews Advanced Directive laws and proper instruction for filling out document.   Pulmonary Video:  -Group video education that reviews the importance of medication and oxygen compliance, exercise, good nutrition, pulmonary hygiene, and pursed lip and diaphragmatic breathing for the pulmonary patient.   Exercise for the Pulmonary Patient:  -Group instruction that is supported by a PowerPoint presentation.  Instructor discusses benefits of exercise, core components of exercise, frequency, duration, and intensity of an exercise routine, importance of utilizing pulse oximetry during exercise, safety while exercising, and options of places to exercise outside of rehab.     Pulmonary Medications:  -Verbally interactive group education provided by instructor with focus on inhaled medications and proper administration.   Anatomy and Physiology of the Respiratory System and Intimacy:  -Group instruction provided by PowerPoint, verbal discussion, and written material to support subject matter. Instructor reviews respiratory cycle and anatomical components of the respiratory system and their functions. Instructor also reviews differences in obstructive and restrictive respiratory diseases with examples of each. Intimacy, Sex, and Sexuality differences are reviewed with a discussion on how relationships can change when diagnosed with pulmonary disease. Common sexual concerns are reviewed.   MD DAY -A group question and answer session with a medical doctor that allows participants to ask questions that relate to their pulmonary disease state.   OTHER EDUCATION -Group or individual verbal, written, or video instructions that support the educational goals of the pulmonary rehab program.   Holiday Eating Survival Tips:  -Group instruction provided by PowerPoint slides, verbal discussion, and written materials to support subject matter. The instructor gives patients tips, tricks,  and techniques to help them not only survive but enjoy the holidays despite the onslaught of food that accompanies the holidays.   Knowledge Questionnaire Score:   Core Components/Risk Factors/Patient Goals at Admission: Personal Goals and Risk Factors at Admission - 03/21/17 1047      Core Components/Risk Factors/Patient Goals on Admission   Improve shortness of breath with ADL's  Yes    Intervention  Provide education,  individualized exercise plan and daily activity instruction to help decrease symptoms of SOB with activities of daily living.    Expected Outcomes  Short Term: Improve cardiorespiratory fitness to achieve a reduction of symptoms when performing ADLs;Long Term: Be able to perform more ADLs without symptoms or delay the onset of symptoms       Core Components/Risk Factors/Patient Goals Review:  Goals and Risk Factor Review    Row Name 04/11/17 0928             Core Components/Risk Factors/Patient Goals Review   Personal Goals Review  Improve shortness of breath with ADL's;Develop more efficient breathing techniques such as purse lipped breathing and diaphragmatic breathing and practicing self-pacing with activity.;Increase knowledge of respiratory medications and ability to use respiratory devices properly.       Review  patient has only attended 2 sessions since admission and it is too soon to evaluate progression towards pulmonary rehab goals       Expected Outcomes  see admission expected outcomes          Core Components/Risk Factors/Patient Goals at Discharge (Final Review):  Goals and Risk Factor Review - 04/11/17 0928      Core Components/Risk Factors/Patient Goals Review   Personal Goals Review  Improve shortness of breath with ADL's;Develop more efficient breathing techniques such as purse lipped breathing and diaphragmatic breathing and practicing self-pacing with activity.;Increase knowledge of respiratory medications and ability to use respiratory devices properly.    Review  patient has only attended 2 sessions since admission and it is too soon to evaluate progression towards pulmonary rehab goals    Expected Outcomes  see admission expected outcomes       ITP Comments:   Comments: patient has attended 2 pulmonary rehab sessions since admission

## 2017-04-12 ENCOUNTER — Encounter (HOSPITAL_COMMUNITY)
Admission: RE | Admit: 2017-04-12 | Discharge: 2017-04-12 | Disposition: A | Discharge status: Home / Self Care | Payer: Medicare HMO | Source: Ambulatory Visit | Attending: Pulmonary Disease | Admitting: Pulmonary Disease

## 2017-04-12 ENCOUNTER — Telehealth (HOSPITAL_COMMUNITY): Payer: Self-pay | Admitting: Family Medicine

## 2017-04-12 DIAGNOSIS — J8410 Pulmonary fibrosis, unspecified: Secondary | ICD-10-CM

## 2017-04-14 ENCOUNTER — Encounter (HOSPITAL_COMMUNITY): Payer: Medicare HMO

## 2017-04-19 ENCOUNTER — Encounter (HOSPITAL_COMMUNITY): Admission: RE | Admit: 2017-04-19 | Payer: Medicare HMO | Source: Ambulatory Visit

## 2017-04-19 ENCOUNTER — Telehealth (HOSPITAL_COMMUNITY): Payer: Self-pay

## 2017-04-20 ENCOUNTER — Encounter (HOSPITAL_COMMUNITY): Payer: Self-pay | Admitting: *Deleted

## 2017-04-21 ENCOUNTER — Encounter (HOSPITAL_COMMUNITY): Payer: Medicare HMO

## 2017-04-26 ENCOUNTER — Encounter (HOSPITAL_COMMUNITY): Payer: Medicare HMO

## 2017-04-28 ENCOUNTER — Encounter (HOSPITAL_COMMUNITY): Payer: Medicare HMO

## 2017-04-28 ENCOUNTER — Telehealth (HOSPITAL_COMMUNITY): Payer: Self-pay

## 2017-04-29 ENCOUNTER — Encounter (HOSPITAL_COMMUNITY): Payer: Self-pay

## 2017-04-29 NOTE — Addendum Note (Signed)
Encounter addended by: Gaspar Bidding on: 04/29/2017 4:40 PM  Actions taken: Visit Navigator Flowsheet section accepted

## 2017-05-02 NOTE — Addendum Note (Signed)
Encounter addended by: Jewel Baize, RD on: 05/02/2017 9:22 AM  Actions taken: Visit Navigator Flowsheet section accepted

## 2017-05-03 ENCOUNTER — Encounter (HOSPITAL_COMMUNITY): Payer: Medicare HMO

## 2017-05-03 NOTE — Progress Notes (Addendum)
Discharge Progress Report  Patient Details  Name: Ryan [name] MRN: [id number] Date of Birth: 07-May-1931 Referring Provider:     Pulmonary Rehab Walk Test from 03/29/2017 in [place]  Referring Provider  Dr. Elsworth Soho       Number of Visits: 2  Reason for Discharge:  Early Exit:  New onset of gout in foot requiring patient to use walker and partial weight bearing. Patient request discharge instead of medical hold.  Smoking History:  Social History   Tobacco Use  Smoking Status Former Smoker  . Packs/day: 1.00  . Years: 35.00  . Pack years: 35.00  . Types: Pipe, Cigarettes  . Last attempt to quit: 02/23/1992  . Years since quitting: 25.2  Smokeless Tobacco Never Used  Tobacco Comment   quit 20 years ago    Diagnosis:  No diagnosis found.  ADL UCSD: Pulmonary Assessment Scores    Row Name 03/31/17 [phone] 04/20/17 1210       ADL UCSD   ADL Phase  Entry  Entry    SOB Score total  -  0 He states that is unable to do these activities due to back pain      CAT Score   CAT Score  -  7 Entry      mMRC Score   mMRC Score  2  -       Initial Exercise Prescription: Initial Exercise Prescription - 03/31/17 0700      Date of Initial Exercise RX and Referring Provider   Date  03/31/17    Referring Provider  Dr. Freddi Starr   Level  2    Watts  10    Minutes  17      NuStep   Level  2    SPM  80    Minutes  17    METs  1.5      Track   Laps  8    Minutes  17      Prescription Details   Frequency (times per week)  2    Duration  Progress to 45 minutes of aerobic exercise without signs/symptoms of physical distress      Intensity   THRR 40-80% of Max Heartrate  55-110    Ratings of Perceived Exertion  11-13    Perceived Dyspnea  0-4      Progression   Progression  Continue progressive overload as per policy without signs/symptoms or physical distress.      Resistance Training   Training Prescription  Yes    Weight   orange    Reps  10-15       Discharge Exercise Prescription (Final Exercise Prescription Changes): Exercise Prescription Changes - 04/21/17 1100      Response to Exercise   Blood Pressure (Admit)  114/80    Blood Pressure (Exercise)  146/90    Blood Pressure (Exit)  140/80    Heart Rate (Admit)  86 bpm    Heart Rate (Exercise)  88 bpm    Heart Rate (Exit)  79 bpm    Oxygen Saturation (Admit)  91 %    Oxygen Saturation (Exercise)  94 %    Oxygen Saturation (Exit)  92 %    Rating of Perceived Exertion (Exercise)  17    Perceived Dyspnea (Exercise)  2    Duration  Continue with 45 min of aerobic exercise without signs/symptoms of physical distress.    Intensity  THRR  unchanged      Progression   Progression  Continue to progress workloads to maintain intensity without signs/symptoms of physical distress.      Resistance Training   Training Prescription  Yes    Weight  orange    Reps  10-15    Time  10 Minutes      NuStep   Level  2    SPM  80    Minutes  17    METs  1.7      Arm Ergometer   Level  2    Minutes  17       Functional Capacity: 6 Minute Walk    Row Name 03/31/17 0741         6 Minute Walk   Phase  Initial     Distance  1200 feet     Walk Time  6 minutes     # of Rest Breaks  0     MPH  2.27     METS  2.68     RPE  12     Perceived Dyspnea   0     Symptoms  No     Resting HR  85 bpm     Resting BP  114/80     Resting Oxygen Saturation   92 %     Exercise Oxygen Saturation  during 6 min walk  87 %     Max Ex. HR  110 bpm     Max Ex. BP  159/115     2 Minute Post BP  133/91       Interval HR   1 Minute HR  93     2 Minute HR  98     3 Minute HR  86     4 Minute HR  90     5 Minute HR  110     6 Minute HR  104     Interval Heart Rate?  Yes       Interval Oxygen   Interval Oxygen?  Yes     Baseline Oxygen Saturation %  92 %     1 Minute Oxygen Saturation %  86 %     1 Minute Liters of Oxygen  0 L     2 Minute Oxygen Saturation %   88 %     2 Minute Liters of Oxygen  0 L     3 Minute Oxygen Saturation %  88 %     3 Minute Liters of Oxygen  0 L     4 Minute Oxygen Saturation %  88 %     4 Minute Liters of Oxygen  0 L     5 Minute Oxygen Saturation %  87 %     5 Minute Liters of Oxygen  0 L     6 Minute Oxygen Saturation %  87 %     6 Minute Liters of Oxygen  0 L        Psychological, QOL, Others - Outcomes: PHQ 2/9: Depression screen PHQ 2/9 03/21/2017  Decreased Interest 0  Down, Depressed, Hopeless 1  PHQ - 2 Score 1  Some recent data might be hidden    Quality of Life:   Personal Goals: Goals established at orientation with interventions provided to work toward goal. Personal Goals and Risk Factors at Admission - 03/21/17 1047      Core Components/Risk Factors/Patient Goals on Admission   Improve shortness of breath  with ADL's  Yes    Intervention  Provide education, individualized exercise plan and daily activity instruction to help decrease symptoms of SOB with activities of daily living.    Expected Outcomes  Short Term: Improve cardiorespiratory fitness to achieve a reduction of symptoms when performing ADLs;Long Term: Be able to perform more ADLs without symptoms or delay the onset of symptoms        Personal Goals Discharge: Goals and Risk Factor Review    Row Name 04/11/17 0928             Core Components/Risk Factors/Patient Goals Review   Personal Goals Review  Improve shortness of breath with ADL's;Develop more efficient breathing techniques such as purse lipped breathing and diaphragmatic breathing and practicing self-pacing with activity.;Increase knowledge of respiratory medications and ability to use respiratory devices properly.       Review  patient has only attended 2 sessions since admission and it is too soon to evaluate progression towards pulmonary rehab goals       Expected Outcomes  see admission expected outcomes          Exercise Goals and Review:   Nutrition &  Weight - Outcomes: Pre Biometrics - 03/21/17 1044      Pre Biometrics   Grip Strength  26 kg        Nutrition: Nutrition Therapy & Goals - 05/02/17 0922      Nutrition Therapy   Diet  Heart Healthy      Intervention Plan   Intervention  Prescribe, educate and counsel regarding individualized specific dietary modifications aiming towards targeted core components such as weight, hypertension, lipid management, diabetes, heart failure and other comorbidities.    Expected Outcomes  Short Term Goal: Understand basic principles of dietary content, such as calories, fat, sodium, cholesterol and nutrients.;Long Term Goal: Adherence to prescribed nutrition plan.       Nutrition Discharge: Nutrition Assessments - 05/02/17 0921      Rate Your Plate Scores   Pre Score  52       Education Questionnaire Score: Knowledge Questionnaire Score - 04/20/17 1210      Knowledge Questionnaire Score   Pre Score  13/18

## 2017-05-05 ENCOUNTER — Encounter (HOSPITAL_COMMUNITY): Payer: Medicare HMO

## 2017-05-10 ENCOUNTER — Ambulatory Visit: Payer: Medicare HMO | Admitting: Cardiovascular Disease

## 2017-05-10 ENCOUNTER — Encounter: Payer: Self-pay | Admitting: Cardiovascular Disease

## 2017-05-10 ENCOUNTER — Encounter (HOSPITAL_COMMUNITY): Payer: Medicare HMO

## 2017-05-10 DIAGNOSIS — I10 Essential (primary) hypertension: Secondary | ICD-10-CM | POA: Diagnosis not present

## 2017-05-10 DIAGNOSIS — R600 Localized edema: Secondary | ICD-10-CM | POA: Diagnosis not present

## 2017-05-10 NOTE — Patient Instructions (Signed)
Medication Instructions:  Your physician recommends that you continue on your current medications as directed. Please refer to the Current Medication list given to you today.   Labwork: none  Testing/Procedures: none  Follow-Up: We request that you follow-up in: 3 months with an Kerin Ransom, [place] and in 12 months with Dr Andria Rhein will receive a reminder letter in the mail two months in advance. If you don't receive a letter, please call our office to schedule the follow-up appointment.    Any Other Special Instructions Will Be Listed Below (If Applicable).     If you need a refill on your cardiac medications before your next appointment, please call your pharmacy.

## 2017-05-10 NOTE — Assessment & Plan Note (Signed)
History of essential hypertension blood pressure measured 136/80. He is not on antihypertensive medications

## 2017-05-10 NOTE — Assessment & Plan Note (Signed)
History of hyperlipidemia not on statin therapy followed by his PCP

## 2017-05-10 NOTE — Progress Notes (Signed)
05/10/2017 Ryan [name]   07-17-1931  [id number]  Primary Physician Chesley Noon, MD Primary Cardiologist: Lorretta Harp MD Ryan [name], [place]  HPI:  Ryan [name] is a 84 y.o.  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  He was referred by Dr. Melford Aase for cardiovascular evaluation because of lower extremity edema. I last saw him in the [place] /2/19. His cardiac resector profiles are notable for treated hypertension. He smoked tobacco remotely having stopped 25-30 years ago. He's never had a heart attack or stroke and denies chest pain or shortness of breath. He did have back surgery performed by Dr. Ellene Route December of last year after which she had urinary obstruction and lower extremity edema which resolved with relief of the obstruction and additional diuretics. He is aware of salt restriction but does admit to dietary indiscretion. His edema had significantly improved with the addition of low-dose diuretics and these were recently increased by his primary care provider. A 2-D echo was performed showed normal LV function with grade 1 diastolic dysfunction and moderate pulmonary hypertension. He does admit to orthopnea as well.A Myoview stress test performed 09/12/15 was low risk. A 2-D echo revealed normal LV systolic function with moderate pulmonary hypertension and a 48 hour monitor showed sinus rhythm with PACs. Since I saw him in the office 9 months ago he's done well although he has had progressive dyspnea on exertion or lower extremity edema. Apparently he was on Zaroxolyn which was recently discontinued because of hypokalemia. His major complaints are of progressive bilateral lower extremity edema with weight gain. He was recently diagnosed with gout and was placed on oral prednisone. Unfortunately, heran out of his potassium several days ago and stopped his Lasix at the same time  and has since gained 5 pounds.     Current Meds  Medication Sig  . ALBUTEROL SULFATE HFA IN Inhale into the lungs as needed.  . ALPRAZolam (XANAX) 0.25 MG tablet Take 0.125-0.25 mg by mouth 2 (two) times daily as needed for anxiety. Reported on 08/19/2015  . budesonide-formoterol (SYMBICORT) 160-4.5 MCG/ACT inhaler Inhale 2 puffs into the lungs 2 (two) times daily.  . finasteride (PROSCAR) 5 MG tablet Take 1 tablet (5 mg total) by mouth daily.  . furosemide (LASIX) 40 MG tablet Take 80 mg by mouth 2 (two) times daily.  Marland Kitchen latanoprost (XALATAN) 0.005 % ophthalmic solution 1 drop 2 (two) times daily.  . Lutein-Zeaxanthin 6-0.24 MG CAPS Take by mouth.  . montelukast (SINGULAIR) 10 MG tablet Take 10 mg by mouth at bedtime.  Marland Kitchen omeprazole (PRILOSEC) 20 MG capsule Take 20 mg by mouth daily.  . potassium chloride SA (K-DUR,KLOR-CON) 20 MEQ tablet Take 40 mEq by mouth 2 (two) times daily.   . pramipexole (MIRAPEX) 0.75 MG tablet Take 0.75 mg by mouth at bedtime.  . predniSONE (STERAPRED UNI-PAK 21 TAB) 10 MG (21) TBPK tablet Take by mouth daily. Take 6 tabs by mouth daily  for 2 days, then 5 tabs for 2 days, then 4 tabs for 2 days, then 3 tabs for 2 days, 2 tabs for 2 days, then 1 tab by mouth daily for 2 days  . sodium chloride (OCEAN) 0.65 % SOLN nasal spray Place 1 spray into both nostrils daily.  . tamsulosin (FLOMAX) 0.4 MG CAPS capsule   . traMADol (ULTRAM) 50 MG tablet Take 50 mg by mouth as needed.  Marland Kitchen  travoprost, benzalkonium, (TRAVATAN) 0.004 % ophthalmic solution 1 drop at bedtime.  . triazolam (HALCION) 0.25 MG tablet Take 1 tablet by mouth at bedtime as needed.  . [DISCONTINUED] rOPINIRole (REQUIP) 1 MG tablet Take 1 tablet (1 mg total) by mouth at bedtime.     Allergies  Allergen Reactions  . Pollen Extract-Tree Extract Other (See Comments)    HEADACHES, TIRED , DRAINAGE FROM SINUSES  . Molds & Smuts Other (See Comments)    Also dust mites causes sinus infections, h/a etc.     Social History   Socioeconomic History  . Marital status: Married    Spouse name: Not on file  . Number of children: 2  . Years of education: Not on file  . Highest education level: Not on file  Occupational History  . 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 Financial resource strain: Not on file  . Food insecurity:    Worry: Not on file    Inability: Not on file  . Transportation needs:    Medical: Not on file    Non-medical: Not on file  Tobacco Use  . Smoking status: Former Smoker    Packs/day: 1.00    Years: 35.00    Pack years: 35.00    Types: Pipe, Cigarettes    Last attempt to quit: 02/23/1992    Years since quitting: 25.2  . Smokeless tobacco: Never Used  . Tobacco comment: quit 20 years ago  Substance and Sexual Activity  . Alcohol use: Yes    Comment: 2 drinks daily scotch  AND WINE WITH SUPPER  . Drug use: No  . Sexual activity: Not on file  Lifestyle  . Physical activity:    Days per week: Not on file    Minutes per session: Not on file  . Stress: Not on file  Relationships  . Social connections:    Talks on phone: Not on file    Gets together: Not on file    Attends religious service: Not on file    Active member of club or organization: Not on file    Attends meetings of clubs or organizations: Not on file    Relationship status: Not on file  . Intimate partner violence:    Fear of current or ex partner: Not on file    Emotionally abused: Not on file    Physically abused: Not on file    Forced sexual activity: Not on file  Other Topics Concern  . 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   Cardiovascular: negative for chest pain, dyspnea on exertion, edema, orthopnea, palpitations, paroxysmal nocturnal dyspnea or shortness of breath Dermatological: negative for rash Respiratory: negative for cough or wheezing Urologic: negative for hematuria Abdominal: negative for nausea, vomiting, diarrhea, bright red blood per rectum, melena, or hematemesis Neurologic: negative for visual changes, syncope, or dizziness All other systems reviewed and are otherwise negative except as noted above.    Blood pressure 136/80, pulse 99, height _0  (1.702 m), weight 195 lb (88.5 kg).  General appearance: alert and no distress Neck: no adenopathy,  no carotid bruit, no JVD, supple, symmetrical, trachea midline and thyroid not enlarged, symmetric, no tenderness/mass/nodules Lungs: clear to auscultation bilaterally Heart: regular rate and rhythm, S1, S2 normal, no murmur, click, rub or gallop Extremities: 1-2+ pitting edema right greater than left Pulses: 2+ and symmetric Skin: Skin color, texture, turgor normal. No rashes or lesions Neurologic: Alert and oriented X 3, normal strength and tone. Normal symmetric reflexes. Normal coordination and gait  EKG not performed today  ASSESSMENT AND PLAN:   HYPERLIPIDEMIA History of hyperlipidemia not on statin therapy followed by his PCP  Essential hypertension History of essential hypertension blood pressure measured 136/80. He is not on antihypertensive medications  Bilateral lower extremity edema History of bilateral lower extremity edema improved with oral diuretics requiring potassium repletion. He has had recent diagnosis of gout on prednisone with 5 pound weight gain 30 stop taking his Lasix 5 days ago because he ran out of his potassium and his weight increased 5 pounds.      Lorretta Harp MD FACP,FACC,FAHA, [facility] 05/10/2017 11:27 AM

## 2017-05-10 NOTE — Assessment & Plan Note (Signed)
History of bilateral lower extremity edema improved with oral diuretics requiring potassium repletion. He has had recent diagnosis of gout on prednisone with 5 pound weight gain 30 stop taking his Lasix 5 days ago because he ran out of his potassium and his weight increased 5 pounds.

## 2017-05-12 ENCOUNTER — Encounter (HOSPITAL_COMMUNITY): Payer: Medicare HMO

## 2017-05-17 ENCOUNTER — Encounter (HOSPITAL_COMMUNITY): Payer: Medicare HMO

## 2017-05-19 ENCOUNTER — Encounter (HOSPITAL_COMMUNITY): Payer: Medicare HMO

## 2017-05-20 ENCOUNTER — Ambulatory Visit: Payer: Medicare HMO | Admitting: Adult Health

## 2017-05-24 ENCOUNTER — Encounter (HOSPITAL_COMMUNITY): Payer: Medicare HMO

## 2017-05-26 ENCOUNTER — Encounter (HOSPITAL_COMMUNITY): Payer: Medicare HMO

## 2017-05-31 ENCOUNTER — Encounter (HOSPITAL_COMMUNITY): Payer: Medicare HMO

## 2017-06-02 ENCOUNTER — Encounter (HOSPITAL_COMMUNITY): Payer: Medicare HMO

## 2017-06-07 ENCOUNTER — Encounter (HOSPITAL_COMMUNITY): Payer: Medicare HMO

## 2017-06-09 ENCOUNTER — Encounter (HOSPITAL_COMMUNITY): Payer: Medicare HMO

## 2017-06-14 ENCOUNTER — Encounter (HOSPITAL_COMMUNITY): Payer: Medicare HMO

## 2017-06-16 ENCOUNTER — Encounter (HOSPITAL_COMMUNITY): Payer: Medicare HMO

## 2017-06-21 ENCOUNTER — Encounter (HOSPITAL_COMMUNITY): Payer: Medicare HMO

## 2017-06-23 ENCOUNTER — Encounter (HOSPITAL_COMMUNITY): Payer: Medicare HMO

## 2017-06-28 ENCOUNTER — Encounter (HOSPITAL_COMMUNITY): Payer: Medicare HMO

## 2017-06-30 ENCOUNTER — Encounter (HOSPITAL_COMMUNITY): Payer: Medicare HMO

## 2017-07-05 ENCOUNTER — Encounter (HOSPITAL_COMMUNITY): Payer: Medicare HMO

## 2017-07-07 ENCOUNTER — Encounter (HOSPITAL_COMMUNITY): Payer: Medicare HMO

## 2017-07-28 ENCOUNTER — Encounter: Payer: Self-pay | Admitting: Cardiology

## 2017-07-28 ENCOUNTER — Ambulatory Visit: Payer: Medicare HMO | Admitting: Cardiology

## 2017-07-28 VITALS — BP 110/76 | HR 90 | Ht 66.0 in | Wt 192.2 lb

## 2017-07-28 DIAGNOSIS — J8410 Pulmonary fibrosis, unspecified: Secondary | ICD-10-CM

## 2017-07-28 DIAGNOSIS — I5032 Chronic diastolic (congestive) heart failure: Secondary | ICD-10-CM

## 2017-07-28 DIAGNOSIS — I872 Venous insufficiency (chronic) (peripheral): Secondary | ICD-10-CM

## 2017-07-28 DIAGNOSIS — C8580 Other specified types of non-Hodgkin lymphoma, unspecified site: Secondary | ICD-10-CM

## 2017-07-28 DIAGNOSIS — I10 Essential (primary) hypertension: Secondary | ICD-10-CM

## 2017-07-28 NOTE — Progress Notes (Signed)
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 He was seen by Dr Gwenlyn Found in 2016 for LE edema. He was though to have AF in 2017 but review of a Holter monitor showed NSR with PACs. Echo done 08/18/15 showed pulmonary HTN with a PA pressure of 58 mmHg, grade 1 DD, and normal LVF. He has been seen by Dr Elsworth Soho and was given the diagnosis of pulmonary fibrosis. He now sees Dr Michela Pitcher in [place]. 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    He is in the office today for routine follow up. He gets around using a cane. He has LE edema and increases his Lasix based on this and his home weight. A recent BMP done by dr Melford Aase shows his renal function is stable- BUN 14/ SCr 0.94. His HR is irregular but he is unaware of this.    Current Outpatient Medications  Medication Sig Dispense Refill  . ALBUTEROL SULFATE HFA IN Inhale into the lungs as needed.    . ALPRAZolam (XANAX) 0.25 MG tablet Take 0.125-0.25 mg by mouth 2 (two) times daily as needed for anxiety. Reported on 08/19/2015    . finasteride (PROSCAR) 5 MG tablet Take 1 tablet (5 mg total) by mouth daily. 30 tablet 5  . furosemide (LASIX) 40 MG tablet Take 80 mg by mouth 2 (two) times daily.    Marland Kitchen latanoprost (XALATAN) 0.005 % ophthalmic solution 1 drop 2 (two) times daily.    . Lutein-Zeaxanthin 6-0.24 MG CAPS Take by mouth.    . montelukast (SINGULAIR) 10 MG tablet Take 10 mg by mouth at bedtime.    Marland Kitchen omeprazole (PRILOSEC) 20 MG capsule Take 20 mg by mouth daily.    . potassium chloride SA (K-DUR,KLOR-CON) 20 MEQ tablet Take 40 mEq by mouth 2 (two) times daily.     . pramipexole (MIRAPEX) 0.75 MG tablet Take 0.75 mg by mouth at bedtime.    . sodium chloride (OCEAN) 0.65 % SOLN nasal spray Place 1  spray into both nostrils daily.    . tamsulosin (FLOMAX) 0.4 MG CAPS capsule   0  . traMADol (ULTRAM) 50 MG tablet Take 50 mg by mouth as needed.    . travoprost, benzalkonium, (TRAVATAN) 0.004 % ophthalmic solution 1 drop at bedtime.    . triazolam (HALCION) 0.25 MG tablet Take 1 tablet by mouth at bedtime as needed.     No current facility-administered medications for this visit.     Allergies  Allergen Reactions  . Pollen Extract-Tree Extract Other (See Comments)    HEADACHES, TIRED , DRAINAGE FROM SINUSES  . Molds & Smuts Other (See Comments)    Also dust mites causes sinus infections, h/a etc.    Past Medical History:  Diagnosis Date  . Abdominal pain    Bloating and gas  . Asthma   . Atelectasis, bilateral 02/10/11   Noted On CT Scan - Mild Dependent Atelectasis at the Lung Bases  . Barrett's esophagus   . Benign prostatic hypertrophy   . Bilateral lower extremity edema   . Chronic fatigue   . Congestion of throat    mouth breath at night, dry mouth  . Diverticulosis of colon (without mention of hemorrhage)   . Dysrhythmia    SKIPPED  HEART BEAT SOMETIMES  . Environmental allergies   . Esophageal reflux   . Gastritis   . Gluten intolerance   . Hiatal hernia 02/10/11   Noted on CT Scan - Moderate Hiatal Hernia  . Hyperlipemia   . Hypertension   . Hypertension   . Lactose intolerance   . 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 Melanoma ([place])    lymphoma  . Microcytic anemia 04/20/2011   . 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 Osteoarthritis    hands/feet,knees, NECK, BACK  . Pain    LOWER BACK PAIN - DDD, SCOLIOSIS, BONE SPUR.  FINISHED THE 3RD EPIDURAL STEROID INJECTION 7/15 - DONE BY DR. Nelva Bush. - STILL HAVING BACK PAIN  . Periaortic lymphadenopathy 02/16/2011  . Personal history of colonic polyps 2004   hyperplastic Dr. Collene Mares  . Renal cyst 02/10/11    Noted  on CT Scan - Bilateral Renal Cysts  . S/P radiation therapy 03/15/11 - 04/09/11   Abdominal/ Pelvic Tumor, 3600 cGy/20 Fractions  . Sleep disorder    DOES NOT SLEEP WELL    Social History   Socioeconomic History  . Marital status: Married    Spouse name: Not on file  . Number of children: 2  . Years of education: Not on file  . Highest education level: Not on file  Occupational History  . 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 Financial resource strain: Not on file  . Food insecurity:    Worry: Not on file    Inability: Not on file  . Transportation needs:    Medical: Not on file    Non-medical: Not on file  Tobacco Use  . Smoking status: Former Smoker    Packs/day: 1.00    Years: 35.00    Pack years: 35.00    Types: Pipe, Cigarettes    Last attempt to quit: 02/23/1992    Years since quitting: 25.4  . Smokeless tobacco: Never Used  . Tobacco comment: quit 20 years ago  Substance and Sexual Activity  . Alcohol use: Yes    Comment: 2 drinks daily scotch  AND WINE WITH SUPPER  . Drug use: No  . Sexual activity: Not on file  Lifestyle  . Physical activity:    Days per week: Not on file    Minutes per session: Not on file  . Stress: Not on file  Relationships  . Social connections:    Talks on phone: Not on file    Gets together: Not on file    Attends religious service: Not on file    Active member of club or organization: Not on file    Attends meetings of clubs or organizations: Not on file    Relationship status: Not on file  . Intimate partner violence:    Fear of current or ex partner: Not on file    Emotionally abused: Not on file    Physically abused: Not on file    Forced sexual activity: Not on file  Other Topics Concern  . Not on file  Social History Narrative   Married - second marriage   He has two children   Retired Horticulturist, commercial Professor   Currently teaches pottery making, 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[facility]     Former Smoker quit 12 years ago- smoked for 35 years  Alcohol use- yes     Family History  Problem Relation Age of Onset  . Heart disease Father        MI 50  . Urticaria Father   . Prostate cancer Brother   . Prostate cancer Paternal Uncle   . Prostate cancer Paternal Uncle   . Prostate cancer Paternal Uncle   . Hyperlipidemia Unknown   . Stroke Unknown   . Hypertension Unknown   . ADD / ADHD Unknown   . Colon cancer Neg Hx   . Allergic rhinitis Neg Hx   . Asthma Neg Hx   . Eczema Neg Hx      Review of Systems: General: negative for chills, fever, night sweats or weight changes.  Cardiovascular: negative for chest pain, dyspnea on exertion, orthopnea, palpitations, paroxysmal nocturnal dyspnea or shortness of breath Dermatological: negative for rash Respiratory: negative for cough or wheezing Urologic: negative for hematuria Abdominal: negative for nausea, vomiting, diarrhea, bright red blood per rectum, melena, or hematemesis Neurologic: negative for visual changes, syncope, or dizziness All other systems reviewed and are otherwise negative except as noted above.    Blood pressure 110/76, pulse 90, height _0  (1.676 m), weight 192 lb 3.2 oz (87.2 kg), SpO2 91 %.  General appearance: alert, cooperative and no distress Neck: no carotid bruit and no JVD Lungs: bi basilar velcro crackles Heart: regular rate and rhythm and frequent extra systole Extremities: trace-1+ LE edema Skin: Skin color, texture, turgor normal. No rashes or lesions Neurologic: Grossly normal  EKG NSR- frequent PVCs  ASSESSMENT AND PLAN:   Chronic diastolic CHF He currently appears stable- no change in Rx  Pulmonary fibrosis (HCC) Post radiation for NHL. PA pressure 58 mmHg in 2017. RV function was normal then  Non Hodgkin's lymphoma ([place]) In remission- followed by Dr Benay Spice  Essential hypertension Controlled   PLAN  Same cardiac Rx. He self adjusts his Lasix appropriatley and  renal function was stable on 07/19/17. F/U Dr Gwenlyn Found in 6 months.   Kerin Ransom PA-C 07/28/2017 11:41 AM

## 2017-07-28 NOTE — Patient Instructions (Signed)
Your physician recommends that you schedule a follow-up appointment in: [place]  If you need a refill on your cardiac medications before your next appointment, please call your pharmacy.

## 2017-08-26 ENCOUNTER — Inpatient Hospital Stay: Payer: Medicare HMO | Admitting: Oncology

## 2017-08-26 ENCOUNTER — Telehealth: Payer: Self-pay | Admitting: Oncology

## 2017-08-26 NOTE — Telephone Encounter (Signed)
R/S Appointment from 7/26 to 8/1 pt overslept

## 2017-08-29 ENCOUNTER — Telehealth: Payer: Self-pay | Admitting: Oncology

## 2017-08-29 NOTE — Telephone Encounter (Signed)
Returned Advertising account executive.  Patient needed to reschedule appointment from 8/1.

## 2017-08-30 ENCOUNTER — Inpatient Hospital Stay: Payer: Medicare HMO | Attending: Oncology | Admitting: Oncology

## 2017-08-30 ENCOUNTER — Telehealth: Payer: Self-pay | Admitting: Oncology

## 2017-08-30 VITALS — BP 170/99 | HR 90 | Temp 98.9°F | Resp 18 | Ht 66.0 in | Wt 193.9 lb

## 2017-08-30 DIAGNOSIS — G8929 Other chronic pain: Secondary | ICD-10-CM | POA: Diagnosis not present

## 2017-08-30 DIAGNOSIS — C8599 Non-Hodgkin lymphoma, unspecified, extranodal and solid organ sites: Secondary | ICD-10-CM

## 2017-08-30 DIAGNOSIS — C8593 Non-Hodgkin lymphoma, unspecified, intra-abdominal lymph nodes: Secondary | ICD-10-CM | POA: Insufficient documentation

## 2017-08-30 DIAGNOSIS — M545 Low back pain: Secondary | ICD-10-CM | POA: Diagnosis not present

## 2017-08-30 NOTE — Telephone Encounter (Signed)
Appointments scheduled AVS/Calendar declined per 7/30 los

## 2017-08-30 NOTE — Progress Notes (Signed)
  [facility] OFFICE PROGRESS NOTE   Diagnosis: Non-Hodgkin's lymphoma  INTERVAL HISTORY:   Mr. Bushong returns as scheduled.  He reports malaise.  No fever or night sweats.  Good appetite.  Objective:  Vital signs in last 24 hours:  There were no vitals taken for this visit.    HEENT: Neck without mass Lymphatics: No cervical, supraclavicular, axillary, or inguinal nodes Resp: Scattered end inspiratory rhonchi, no respiratory distress Cardio: Regular rate and rhythm with premature beats GI: No mass, no hepatosplenomegaly I am a reducible right inguinal hernia Vascular: Ace edema at the right greater than left lower leg  Lab Results:  Lab Results  Component Value Date   WBC 7.9 08/15/2015   HGB 14.5 08/15/2015   HCT 46.4 08/15/2015   MCV 99.8 08/15/2015   PLT 274 08/15/2015   NEUTROABS 3.9 06/26/2015    CMP  Lab Results  Component Value Date   NA 144 03/16/2017   K 4.4 03/16/2017   CL 100 03/16/2017   CO2 27 03/16/2017   GLUCOSE 81 03/16/2017   BUN 19 03/16/2017   CREATININE 1.10 03/16/2017   CALCIUM 9.0 03/16/2017   PROT 6.2 (L) 06/26/2015   ALBUMIN 3.5 06/26/2015   AST 17 06/26/2015   ALT 15 06/26/2015   ALKPHOS 87 06/26/2015   BILITOT 0.50 06/26/2015   GFRNONAA 62 03/16/2017   GFRAA 71 03/16/2017   Medications: I have reviewed the patient's current medications.   Assessment/Plan: 1.Non-Hodgkin's,, low-grade B cell diagnosed in January 2013, disease localized to the pelvis, status post involved field radiation to 3600cGy completed in March of 2013, no evidence of recurrent lymphoma on a CT 04/17/2013  No evidence of recurrent lymphoma on CTs of the chest, abdomen, and pelvis 12/24/2014  CT abdomen/pelvis 03/08/2017-no adenopathy  2. Intermittent abdominal pain  3. Chronic back pain-followed by Dr.Elsner status post epidural steroid injections and decompression surgery 12/31/2013  4. Anemia-history of iron deficiency, normal  ferritin in August 2015 and November 2015   Disposition: Mr. Christiana remains in clinical remission from non-Hodgkin's lymphoma.  He will return for an office visit in 1 year.  15 minutes were spent with the patient today.  The majority of the time was used for counseling and coordination of care.  Betsy Coder, MD  08/30/2017  3:15 PM

## 2017-09-01 ENCOUNTER — Ambulatory Visit: Payer: Medicare HMO | Admitting: Oncology

## 2018-01-20 ENCOUNTER — Ambulatory Visit: Payer: Medicare HMO | Admitting: Cardiovascular Disease

## 2018-01-27 ENCOUNTER — Encounter: Payer: Self-pay | Admitting: *Deleted

## 2018-03-08 ENCOUNTER — Emergency Department (HOSPITAL_COMMUNITY): Payer: Medicare HMO

## 2018-03-08 ENCOUNTER — Encounter (HOSPITAL_COMMUNITY): Payer: Self-pay

## 2018-03-08 ENCOUNTER — Emergency Department (HOSPITAL_COMMUNITY)
Admission: EM | Admit: 2018-03-08 | Discharge: 2018-03-08 | Disposition: A | Discharge status: Home / Self Care | Payer: Medicare HMO | Attending: Emergency Medicine | Admitting: Emergency Medicine

## 2018-03-08 ENCOUNTER — Other Ambulatory Visit: Payer: Self-pay

## 2018-03-08 DIAGNOSIS — R5383 Other fatigue: Secondary | ICD-10-CM

## 2018-03-08 DIAGNOSIS — Z79899 Other long term (current) drug therapy: Secondary | ICD-10-CM | POA: Insufficient documentation

## 2018-03-08 DIAGNOSIS — Z87891 Personal history of nicotine dependence: Secondary | ICD-10-CM | POA: Diagnosis not present

## 2018-03-08 DIAGNOSIS — I10 Essential (primary) hypertension: Secondary | ICD-10-CM | POA: Insufficient documentation

## 2018-03-08 DIAGNOSIS — J449 Chronic obstructive pulmonary disease, unspecified: Secondary | ICD-10-CM | POA: Diagnosis not present

## 2018-03-08 DIAGNOSIS — N179 Acute kidney failure, unspecified: Secondary | ICD-10-CM | POA: Diagnosis not present

## 2018-03-08 DIAGNOSIS — R300 Dysuria: Secondary | ICD-10-CM | POA: Diagnosis present

## 2018-03-08 LAB — I-STAT TROPONIN, ED: TROPONIN I, POC: 0.01 ng/mL (ref 0.00–0.08)

## 2018-03-08 LAB — CBC WITH DIFFERENTIAL/PLATELET
ABS IMMATURE GRANULOCYTES: 0.05 10*3/uL (ref 0.00–0.07)
BASOS PCT: 0 %
Basophils Absolute: 0 10*3/uL (ref 0.0–0.1)
Eosinophils Absolute: 0.1 10*3/uL (ref 0.0–0.5)
Eosinophils Relative: 1 %
HCT: 51.9 % (ref 39.0–52.0)
Hemoglobin: 15.9 g/dL (ref 13.0–17.0)
Immature Granulocytes: 1 %
LYMPHS PCT: 20 %
Lymphs Abs: 1.7 10*3/uL (ref 0.7–4.0)
MCH: 28.1 pg (ref 26.0–34.0)
MCHC: 30.6 g/dL (ref 30.0–36.0)
MCV: 91.9 fL (ref 80.0–100.0)
MONO ABS: 0.8 10*3/uL (ref 0.1–1.0)
MONOS PCT: 9 %
NEUTROS ABS: 5.8 10*3/uL (ref 1.7–7.7)
NEUTROS PCT: 69 %
PLATELETS: 214 10*3/uL (ref 150–400)
RBC: 5.65 MIL/uL (ref 4.22–5.81)
RDW: 16.3 % — ABNORMAL HIGH (ref 11.5–15.5)
WBC: 8.5 10*3/uL (ref 4.0–10.5)
nRBC: 0 % (ref 0.0–0.2)

## 2018-03-08 LAB — BASIC METABOLIC PANEL
ANION GAP: 9 (ref 5–15)
BUN: 63 mg/dL — AB (ref 8–23)
CO2: 39 mmol/L — ABNORMAL HIGH (ref 22–32)
Calcium: 8.7 mg/dL — ABNORMAL LOW (ref 8.9–10.3)
Chloride: 90 mmol/L — ABNORMAL LOW (ref 98–111)
Creatinine, Ser: 1.99 mg/dL — ABNORMAL HIGH (ref 0.61–1.24)
GFR calc Af Amer: 35 mL/min — ABNORMAL LOW (ref >60)
GFR, EST NON AFRICAN AMERICAN: 30 mL/min — AB (ref >60)
GLUCOSE: 168 mg/dL — AB (ref 70–99)
POTASSIUM: 3.6 mmol/L (ref 3.5–5.1)
Sodium: 138 mmol/L (ref 135–145)

## 2018-03-08 LAB — HEPATIC FUNCTION PANEL
ALBUMIN: 3.7 g/dL (ref 3.5–5.0)
ALT: 34 U/L (ref 0–44)
AST: 35 U/L (ref 15–41)
Alkaline Phosphatase: 98 U/L (ref 38–126)
Bilirubin, Direct: 0.1 mg/dL (ref 0.0–0.2)
Indirect Bilirubin: 0.5 mg/dL (ref 0.3–0.9)
Total Bilirubin: 0.6 mg/dL (ref 0.3–1.2)
Total Protein: 6.8 g/dL (ref 6.5–8.1)

## 2018-03-08 LAB — URINALYSIS, ROUTINE W REFLEX MICROSCOPIC
Bacteria, UA: NONE SEEN
Bilirubin Urine: NEGATIVE
GLUCOSE, UA: NEGATIVE mg/dL
HGB URINE DIPSTICK: NEGATIVE
Ketones, ur: NEGATIVE mg/dL
LEUKOCYTES UA: NEGATIVE
NITRITE: NEGATIVE
PROTEIN: NEGATIVE mg/dL
SPECIFIC GRAVITY, URINE: 1.009 (ref 1.005–1.030)
pH: 7 (ref 5.0–8.0)

## 2018-03-08 LAB — AMMONIA: Ammonia: 26 umol/L (ref 9–35)

## 2018-03-08 LAB — BRAIN NATRIURETIC PEPTIDE: B NATRIURETIC PEPTIDE 5: 40.3 pg/mL (ref 0.0–100.0)

## 2018-03-08 LAB — LIPASE, BLOOD: LIPASE: 47 U/L (ref 11–51)

## 2018-03-08 NOTE — Discharge Instructions (Signed)
Your work-up today showed slight worsening of your kidney function however your BUN was improved to the values you reported.  Your other work-up is reassuring.  Do not appear to be fluid overloaded.  Please drink fluids tonight and call your [facility] team tomorrow for further management.  Based on your reassuring vital signs and well appearance, we feel you are safe for discharge home.  If any symptoms change or worsen, please return to the nearest emergency department.

## 2018-03-08 NOTE — ED Provider Notes (Signed)
[facility] EMERGENCY DEPARTMENT Provider Note   CSN: [id number] Arrival date & time: 03/08/18  1544     History   Chief Complaint Chief Complaint  Patient presents with  . Dysuria    HPI Ryan [name] is a 85 y.o. male with a past medical history of BPH, non-Hodgkin's lymphoma, CHF, hypertension, hyperlipidemia, who presents to ED for 3-day history of urinary frequency and sensation of incomplete voiding.  He states that for the past several years he has had to deal with edema in bilateral lower extremities, abdomen which is intermittently improved with his diuretics.  He comes in today feeling that he is not completely voiding his bladder.  He notes history of similar symptoms "when my prostate was acting up."  He states that this was several years ago.  Denies any dysuria, abdominal pain, back pain.  States that he wears 2.75L of oxygen at all times and is trying to wean himself down to 2 L.  Denies shortness of breath, chest pain or increased abdominal distention.  HPI  Past Medical History:  Diagnosis Date  . Abdominal pain    Bloating and gas  . Asthma   . Atelectasis, bilateral 02/10/11   Noted On CT Scan - Mild Dependent Atelectasis at the Lung Bases  . Barrett's esophagus   . Benign prostatic hypertrophy   . Bilateral lower extremity edema   . Chronic fatigue   . Congestion of throat    mouth breath at night, dry mouth  . Diverticulosis of colon (without mention of hemorrhage)   . Dysrhythmia    SKIPPED HEART BEAT SOMETIMES  . Environmental allergies   . Esophageal reflux   . Gastritis   . Gluten intolerance   . Hiatal hernia 02/10/11   Noted on CT Scan - Moderate Hiatal Hernia  . Hyperlipemia   . Hypertension   . Hypertension   . Lactose intolerance   . Lymphoma of lymph nodes in pelvis ([place]) 03/03/2011    Large Right Retroperitoneal Mass  . Melanoma ([place])    lymphoma  . Microcytic anemia 04/20/2011   . 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 Osteoarthritis    hands/feet,knees, NECK, BACK  . Pain    LOWER BACK PAIN - DDD, SCOLIOSIS, BONE SPUR.  FINISHED THE 3RD EPIDURAL STEROID INJECTION 7/15 - DONE BY DR. Nelva Bush. - STILL HAVING BACK PAIN  . Periaortic lymphadenopathy 02/16/2011  . Personal history of colonic polyps 2004   hyperplastic Dr. Collene Mares  . Renal cyst 02/10/11    Noted on CT Scan - Bilateral Renal Cysts  . S/P radiation therapy 03/15/11 - 04/09/11   Abdominal/ Pelvic Tumor, 3600 cGy/20 Fractions  . Sleep disorder    DOES NOT SLEEP WELL    Patient Active Problem List   Diagnosis Date Noted  . Bilateral lower extremity edema 02/02/2017  . Pulmonary fibrosis ([place]) 11/22/2016  . Blurry vision, bilateral 10/26/2016  . COPD ([place]) 07/05/2016  . Exertional dyspnea 09/10/2015  . Fatigue/malaise 08/19/2015  . Asthma/COPD 02/12/2015  . 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 Chronic diastolic heart failure ([place]) 01/06/2014  . Hypoxemia 01/05/2014  . Essential hypertension 01/05/2014  . Hypokalemia 01/05/2014  . Urinary retention 01/05/2014  . Hypoxia   . Lumbar radiculopathy 12/31/2013  . RLS (restless legs syndrome) 09/21/2012  . Chronic lower back pain 06/15/2012  . Abdominal pain, unspecified site 05/19/2012  . Unspecified deficiency anemia 04/07/2012  . Melanoma (  [place])   . Non Hodgkin's lymphoma ([place])   . Periaortic lymphadenopathy 02/16/2011  . Barrett's esophagus 07/09/2010  . Personal history of colonic polyps 05/28/2010  . History of IBS 05/28/2010  . Diverticulosis of colon (without mention of hemorrhage) 05/28/2010  . Arthritis involving multiple sites 05/28/2010  . Wheat intolerance 05/28/2010  . Chronic venous insufficiency 08/19/2009  . PEDAL EDEMA 08/19/2009  . INSOMNIA, CHRONIC 12/24/2008  . EUSTACHIAN TUBE DYSFUNCTION, BILATERAL 12/23/2008  . ERECTILE DYSFUNCTION, ORGANIC 08/01/2008  . Allergic rhinitis with a nonallergic component  05/20/2008  . VENTRAL HERNIA 02/12/2008  . PALPITATIONS 12/25/2007  . LACTOSE INTOLERANCE 10/17/2007  . HYPERLIPIDEMIA 09/14/2006  . HYPERTENSION 09/14/2006  . BPH (benign prostatic hyperplasia) 09/14/2006  . OSTEOARTHRITIS 09/14/2006  . HEART MURMUR, HX OF 09/14/2006    Past Surgical History:  Procedure Laterality Date  . ARTHROSCOPIC REPAIR ACL     right  . BONE MARROW ASPIRATION  02/25/11   Bone Marrow, Aspirate, Clot, and Bilateral Bx, Right PIC  . CATARACT EXTRACTION, BILATERAL    . EYE SURGERY    . HERNIA REPAIR     LEFT INGUINAL   . INSERTION OF MESH N/A 08/23/2012   Procedure: INSERTION OF MESH;  Surgeon: Madilyn Hook, DO;  Location: [facility];  Service: General;  Laterality: N/A;  . LUMBAR LAMINECTOMY/DECOMPRESSION MICRODISCECTOMY Right 12/31/2013   Procedure: RIGHT L4-5 L5-S1 LAMINECTOMY;  Surgeon: Kristeen Miss, MD;  Location: [facility];  Service: Neurosurgery;  Laterality: Right;  RIGHT L4-5 L5-S1 LAMINECTOMY  . ROTATOR CUFF REPAIR     right  . TONSILLECTOMY    . TONSILLECTOMY    . VENTRAL HERNIA REPAIR N/A 08/23/2012   Procedure: HERNIA REPAIR VENTRAL ADULT;  Surgeon: Madilyn Hook, DO;  Location: [facility];  Service: General;  Laterality: N/A;        Home Medications    Prior to Admission medications   Medication Sig Start Date End Date Taking? Authorizing Provider  ALBUTEROL SULFATE HFA IN Inhale into the lungs as needed.    [provider]  ALPRAZolam Duanne Moron) 0.25 MG tablet Take 0.125-0.25 mg by mouth 2 (two) times daily as needed for anxiety. Reported on 08/19/2015    [provider]  finasteride (PROSCAR) 5 MG tablet Take 1 tablet (5 mg total) by mouth daily. 12/25/12   Norins, Heinz Knuckles, MD  furosemide (LASIX) 40 MG tablet Take 80 mg by mouth 2 (two) times daily.    [provider]  latanoprost (XALATAN) 0.005 % ophthalmic solution 1 drop 2 (two) times daily.    [provider]  Lutein-Zeaxanthin 6-0.24 MG CAPS Take by mouth.     [provider]  montelukast (SINGULAIR) 10 MG tablet Take 10 mg by mouth at bedtime.    [provider]  omeprazole (PRILOSEC) 20 MG capsule Take 20 mg by mouth daily.    [provider]  potassium chloride SA (K-DUR,KLOR-CON) 20 MEQ tablet Take 40 mEq by mouth 2 (two) times daily.     [provider]  pramipexole (MIRAPEX) 0.75 MG tablet Take 0.75 mg by mouth at bedtime.    [provider]  sodium chloride (OCEAN) 0.65 % SOLN nasal spray Place 1 spray into both nostrils daily.    [provider]  tamsulosin (FLOMAX) 0.4 MG CAPS capsule  08/22/15   [provider]  traMADol (ULTRAM) 50 MG tablet Take 50 mg by mouth as needed.    [provider]  travoprost, benzalkonium, (TRAVATAN) 0.004 % ophthalmic solution  1 drop at bedtime.    [provider]  triazolam (HALCION) 0.25 MG tablet Take 1 tablet by mouth at bedtime as needed. 02/25/14   [provider]    Family History Family History  Problem Relation Age of Onset  . Heart disease Father        MI 47  . Urticaria Father   . Prostate cancer Brother   . Prostate cancer Paternal Uncle   . Prostate cancer Paternal Uncle   . Prostate cancer Paternal Uncle   . Hyperlipidemia Unknown   . Stroke Unknown   . Hypertension Unknown   . ADD / ADHD Unknown   . Colon cancer Neg Hx   . Allergic rhinitis Neg Hx   . Asthma Neg Hx   . Eczema Neg Hx     Social History Social History   Tobacco Use  . Smoking status: Former Smoker    Packs/day: 1.00    Years: 35.00    Pack years: 35.00    Types: Pipe, Cigarettes    Last attempt to quit: 02/23/1992    Years since quitting: 26.0  . Smokeless tobacco: Never Used  . Tobacco comment: quit 20 years ago  Substance Use Topics  . Alcohol use: Yes    Comment: 2 drinks daily scotch  AND WINE WITH SUPPER  . Drug use: No     Allergies   Pollen extract-tree extract and Molds & smuts   Review of  Systems Review of Systems  Constitutional: Negative for appetite change, chills and fever.  HENT: Negative for ear pain, rhinorrhea, sneezing and sore throat.   Eyes: Negative for photophobia and visual disturbance.  Respiratory: Negative for cough, chest tightness, shortness of breath and wheezing.   Cardiovascular: Negative for chest pain and palpitations.  Gastrointestinal: Negative for abdominal pain, blood in stool, constipation, diarrhea, nausea and vomiting.  Genitourinary: Positive for difficulty urinating and frequency. Negative for dysuria, hematuria and urgency.  Musculoskeletal: Negative for myalgias.  Skin: Negative for rash.  Neurological: Negative for dizziness, weakness and light-headedness.     Physical Exam Updated Vital Signs BP (!) 134/100   Pulse 78   Temp 98.5 F (36.9 C) (Oral)   Ht _0  (1.676 m)   Wt 89.8 kg   SpO2 97%   BMI 31.96 kg/m   Physical Exam Vitals signs and nursing note reviewed.  Constitutional:      General: He is not in acute distress.    Appearance: He is well-developed.     Comments: 2 L of oxygen being delivered via nasal cannula.  Normal work of breathing.  HENT:     Head: Normocephalic and atraumatic.     Nose: Nose normal.  Eyes:     General: No scleral icterus.       Right eye: No discharge.        Left eye: No discharge.     Conjunctiva/sclera: Conjunctivae normal.  Neck:     Musculoskeletal: Normal range of motion and neck supple.  Cardiovascular:     Rate and Rhythm: Normal rate and regular rhythm.     Heart sounds: Normal heart sounds. No murmur. No friction rub. No gallop.   Pulmonary:     Effort: Pulmonary effort is normal. No respiratory distress.     Breath sounds: Normal breath sounds.  Abdominal:     General: Bowel sounds are normal. There is no distension.     Palpations: Abdomen is soft.     Tenderness: There is  no abdominal tenderness. There is no guarding.  Musculoskeletal: Normal range of motion.   Skin:    General: Skin is warm and dry.     Findings: No rash.  Neurological:     Mental Status: He is alert.     Motor: No abnormal muscle tone.     Coordination: Coordination normal.      ED Treatments / Results  Labs (all labs ordered are listed, but only abnormal results are displayed) Labs Reviewed  URINALYSIS, ROUTINE W REFLEX MICROSCOPIC - Abnormal; Notable for the following components:      Result Value   Color, Urine STRAW (*)    All other components within normal limits  BASIC METABOLIC PANEL - Abnormal; Notable for the following components:   Chloride 90 (*)    CO2 39 (*)    Glucose, Bld 168 (*)    BUN 63 (*)    Creatinine, Ser 1.99 (*)    Calcium 8.7 (*)    GFR calc non Af Amer 30 (*)    GFR calc Af Amer 35 (*)    All other components within normal limits  CBC WITH DIFFERENTIAL/PLATELET - Abnormal; Notable for the following components:   RDW 16.3 (*)    All other components within normal limits  URINE CULTURE  HEPATIC FUNCTION PANEL  AMMONIA  LIPASE, BLOOD  BRAIN NATRIURETIC PEPTIDE  I-STAT TROPONIN, ED    EKG EKG Interpretation  Date/Time:  Wednesday March 08 2018 18:52:45 EST Ventricular Rate:  82 PR Interval:    QRS Duration: 134 QT Interval:  417 QTC Calculation: 502 R Axis:   110 Text Interpretation:  Sinus or ectopic atrial rhythm Atrial premature complexes Short PR interval Right bundle branch block Borderline ST depression, lateral leads Minimal ST elevation, anterior leads Baseline wander in lead(s) II III aVR aVL aVF V1 V2 V3 V5 V6 When compared to prior, significant artifact.  No STEMI Confirmed by Antony Blackbird [phone]) on 03/08/2018 7:06:43 PM   Radiology Dg Chest 2 View  Result Date: 03/08/2018 CLINICAL DATA:  Urinary urgency and frequency. EXAM: CHEST - 2 VIEW COMPARISON:  CT chest November 16, 2016 FINDINGS: Cardiac silhouette is mildly enlarged, superimposed large hiatal hernia better seen on prior CT. LEFT lung base strandy  densities. No pleural effusion or focal consolidation. Azygos fissure. No pneumothorax. Soft tissue planes and included osseous structures are non suspicious. IMPRESSION: 1. Mild cardiomegaly. 2. Large hiatal hernia with LEFT lung base atelectasis versus scarring. Electronically Signed   By: Elon Alas M.D.   On: 03/08/2018 19:24    Procedures Procedures (including critical care time)  Medications Ordered in ED Medications - No data to display   Initial Impression / Assessment and Plan / ED Course  I have reviewed the triage vital signs and the nursing notes.  Pertinent labs & imaging results that were available during my care of the patient were reviewed by me and considered in my medical decision making (see chart for details).     85 year old male presents to ED for urinary frequency and hesitation.  On my initial evaluation states that this is his only symptom.  However, Dr. Sherry Ruffing was able to obtain more of a history from his wife who is now at the bedside.  She is concerned for worsening kidney function, shortness of breath, increased fatigue.  His initial urinalysis is unremarkable.  His BMP shows a BUN of 63, increasing creatinine to 1.99.,  Lipase unremarkable.  LFTs are normal.  BNP is pending  but anticipate admission for further work-up. Please see Dr. Doreene Burke note for further detail.    Portions of this note were generated with [employer]. Dictation errors may occur despite best attempts at proofreading.   Final Clinical Impressions(s) / ED Diagnoses   Final diagnoses:  AKI (acute kidney injury) [facility])    ED Discharge Orders    None       Delia Heady, PA-C 03/08/18 1959    Tegeler, Gwenyth Allegra, MD 03/09/18 [phone]

## 2018-03-08 NOTE — ED Notes (Signed)
Bladder scanned: 69m

## 2018-03-08 NOTE — ED Triage Notes (Signed)
Per ems pt havinf urinary urgency and frequency. Was told by pcp to come in. Lives at [facility]. Denies pain with urination. Pt A&OX4 but has confusion at baseline

## 2018-03-09 LAB — URINE CULTURE: Culture: NO GROWTH

## 2018-07-19 ENCOUNTER — Emergency Department (HOSPITAL_COMMUNITY): Payer: Medicare HMO

## 2018-07-19 ENCOUNTER — Other Ambulatory Visit: Payer: Self-pay

## 2018-07-19 ENCOUNTER — Inpatient Hospital Stay (HOSPITAL_COMMUNITY)
Admission: EM | Admit: 2018-07-19 | Discharge: 2018-07-26 | DRG: 286 | Disposition: A | Discharge status: Home Health | Payer: Medicare HMO | Attending: Internal Medicine | Admitting: Internal Medicine

## 2018-07-19 DIAGNOSIS — N183 Chronic kidney disease, stage 3 (moderate): Secondary | ICD-10-CM | POA: Diagnosis present

## 2018-07-19 DIAGNOSIS — N401 Enlarged prostate with lower urinary tract symptoms: Secondary | ICD-10-CM | POA: Diagnosis present

## 2018-07-19 DIAGNOSIS — K449 Diaphragmatic hernia without obstruction or gangrene: Secondary | ICD-10-CM | POA: Diagnosis present

## 2018-07-19 DIAGNOSIS — K219 Gastro-esophageal reflux disease without esophagitis: Secondary | ICD-10-CM | POA: Diagnosis present

## 2018-07-19 DIAGNOSIS — I872 Venous insufficiency (chronic) (peripheral): Secondary | ICD-10-CM | POA: Diagnosis present

## 2018-07-19 DIAGNOSIS — I2721 Secondary pulmonary arterial hypertension: Secondary | ICD-10-CM | POA: Diagnosis present

## 2018-07-19 DIAGNOSIS — R609 Edema, unspecified: Secondary | ICD-10-CM | POA: Diagnosis not present

## 2018-07-19 DIAGNOSIS — Z79891 Long term (current) use of opiate analgesic: Secondary | ICD-10-CM

## 2018-07-19 DIAGNOSIS — Z87891 Personal history of nicotine dependence: Secondary | ICD-10-CM

## 2018-07-19 DIAGNOSIS — Z923 Personal history of irradiation: Secondary | ICD-10-CM

## 2018-07-19 DIAGNOSIS — G2581 Restless legs syndrome: Secondary | ICD-10-CM | POA: Diagnosis present

## 2018-07-19 DIAGNOSIS — Z9981 Dependence on supplemental oxygen: Secondary | ICD-10-CM

## 2018-07-19 DIAGNOSIS — Z20828 Contact with and (suspected) exposure to other viral communicable diseases: Secondary | ICD-10-CM | POA: Diagnosis present

## 2018-07-19 DIAGNOSIS — R339 Retention of urine, unspecified: Secondary | ICD-10-CM | POA: Diagnosis present

## 2018-07-19 DIAGNOSIS — N179 Acute kidney failure, unspecified: Secondary | ICD-10-CM | POA: Diagnosis not present

## 2018-07-19 DIAGNOSIS — C8590 Non-Hodgkin lymphoma, unspecified, unspecified site: Secondary | ICD-10-CM | POA: Diagnosis present

## 2018-07-19 DIAGNOSIS — J84112 Idiopathic pulmonary fibrosis: Secondary | ICD-10-CM | POA: Diagnosis present

## 2018-07-19 DIAGNOSIS — Z85820 Personal history of malignant melanoma of skin: Secondary | ICD-10-CM

## 2018-07-19 DIAGNOSIS — R338 Other retention of urine: Secondary | ICD-10-CM | POA: Diagnosis present

## 2018-07-19 DIAGNOSIS — E875 Hyperkalemia: Secondary | ICD-10-CM | POA: Diagnosis present

## 2018-07-19 DIAGNOSIS — K2270 Barrett's esophagus without dysplasia: Secondary | ICD-10-CM | POA: Diagnosis present

## 2018-07-19 DIAGNOSIS — E785 Hyperlipidemia, unspecified: Secondary | ICD-10-CM | POA: Diagnosis present

## 2018-07-19 DIAGNOSIS — I50813 Acute on chronic right heart failure: Secondary | ICD-10-CM | POA: Diagnosis not present

## 2018-07-19 DIAGNOSIS — K9041 Non-celiac gluten sensitivity: Secondary | ICD-10-CM | POA: Diagnosis present

## 2018-07-19 DIAGNOSIS — J441 Chronic obstructive pulmonary disease with (acute) exacerbation: Secondary | ICD-10-CM | POA: Diagnosis present

## 2018-07-19 DIAGNOSIS — E7800 Pure hypercholesterolemia, unspecified: Secondary | ICD-10-CM | POA: Diagnosis not present

## 2018-07-19 DIAGNOSIS — I5032 Chronic diastolic (congestive) heart failure: Secondary | ICD-10-CM | POA: Diagnosis not present

## 2018-07-19 DIAGNOSIS — Z8249 Family history of ischemic heart disease and other diseases of the circulatory system: Secondary | ICD-10-CM | POA: Diagnosis not present

## 2018-07-19 DIAGNOSIS — H9190 Unspecified hearing loss, unspecified ear: Secondary | ICD-10-CM | POA: Diagnosis present

## 2018-07-19 DIAGNOSIS — G479 Sleep disorder, unspecified: Secondary | ICD-10-CM | POA: Diagnosis present

## 2018-07-19 DIAGNOSIS — J309 Allergic rhinitis, unspecified: Secondary | ICD-10-CM | POA: Diagnosis present

## 2018-07-19 DIAGNOSIS — R600 Localized edema: Secondary | ICD-10-CM | POA: Diagnosis present

## 2018-07-19 DIAGNOSIS — Z79899 Other long term (current) drug therapy: Secondary | ICD-10-CM

## 2018-07-19 DIAGNOSIS — I5033 Acute on chronic diastolic (congestive) heart failure: Secondary | ICD-10-CM | POA: Diagnosis present

## 2018-07-19 DIAGNOSIS — I130 Hypertensive heart and chronic kidney disease with heart failure and stage 1 through stage 4 chronic kidney disease, or unspecified chronic kidney disease: Principal | ICD-10-CM | POA: Diagnosis present

## 2018-07-19 DIAGNOSIS — N400 Enlarged prostate without lower urinary tract symptoms: Secondary | ICD-10-CM | POA: Diagnosis present

## 2018-07-19 DIAGNOSIS — J449 Chronic obstructive pulmonary disease, unspecified: Secondary | ICD-10-CM | POA: Diagnosis present

## 2018-07-19 DIAGNOSIS — I10 Essential (primary) hypertension: Secondary | ICD-10-CM | POA: Diagnosis not present

## 2018-07-19 DIAGNOSIS — I361 Nonrheumatic tricuspid (valve) insufficiency: Secondary | ICD-10-CM | POA: Diagnosis not present

## 2018-07-19 DIAGNOSIS — K22719 Barrett's esophagus with dysplasia, unspecified: Secondary | ICD-10-CM

## 2018-07-19 DIAGNOSIS — I509 Heart failure, unspecified: Secondary | ICD-10-CM | POA: Diagnosis not present

## 2018-07-19 DIAGNOSIS — I272 Pulmonary hypertension, unspecified: Secondary | ICD-10-CM | POA: Diagnosis not present

## 2018-07-19 LAB — CBC
HCT: 49.5 % (ref 39.0–52.0)
Hemoglobin: 15 g/dL (ref 13.0–17.0)
MCH: 28.8 pg (ref 26.0–34.0)
MCHC: 30.3 g/dL (ref 30.0–36.0)
MCV: 95 fL (ref 80.0–100.0)
Platelets: 231 10*3/uL (ref 150–400)
RBC: 5.21 MIL/uL (ref 4.22–5.81)
RDW: 14.9 % (ref 11.5–15.5)
WBC: 8.9 10*3/uL (ref 4.0–10.5)
nRBC: 0 % (ref 0.0–0.2)

## 2018-07-19 LAB — BASIC METABOLIC PANEL
Anion gap: 12 (ref 5–15)
BUN: 29 mg/dL — ABNORMAL HIGH (ref 8–23)
CO2: 32 mmol/L (ref 22–32)
Calcium: 8.8 mg/dL — ABNORMAL LOW (ref 8.9–10.3)
Chloride: 93 mmol/L — ABNORMAL LOW (ref 98–111)
Creatinine, Ser: 1.51 mg/dL — ABNORMAL HIGH (ref 0.61–1.24)
GFR calc Af Amer: 48 mL/min — ABNORMAL LOW (ref >60)
GFR calc non Af Amer: 42 mL/min — ABNORMAL LOW (ref >60)
Glucose, Bld: 133 mg/dL — ABNORMAL HIGH (ref 70–99)
Potassium: 4.7 mmol/L (ref 3.5–5.1)
Sodium: 137 mmol/L (ref 135–145)

## 2018-07-19 LAB — URINALYSIS, ROUTINE W REFLEX MICROSCOPIC
Bacteria, UA: NONE SEEN
Bilirubin Urine: NEGATIVE
Glucose, UA: NEGATIVE mg/dL
Ketones, ur: NEGATIVE mg/dL
Leukocytes,Ua: NEGATIVE
Nitrite: NEGATIVE
Protein, ur: NEGATIVE mg/dL
Specific Gravity, Urine: 1.008 (ref 1.005–1.030)
pH: 7 (ref 5.0–8.0)

## 2018-07-19 LAB — TROPONIN I: Troponin I: <0.03 ng/mL (ref <0.03)

## 2018-07-19 LAB — BRAIN NATRIURETIC PEPTIDE: B Natriuretic Peptide: 36.1 pg/mL (ref 0.0–100.0)

## 2018-07-19 MED ORDER — ENOXAPARIN SODIUM 40 MG/0.4ML ~~LOC~~ SOLN
40.0000 mg | SUBCUTANEOUS | Status: DC
  Administered 2018-07-19 – 2018-07-25 (×7): 40 mg via SUBCUTANEOUS
  Filled 2018-07-19 (×8): qty 0.4

## 2018-07-19 MED ORDER — PRAMIPEXOLE DIHYDROCHLORIDE 0.25 MG PO TABS
0.7500 mg | ORAL_TABLET | Freq: Every day | ORAL | Status: DC
  Administered 2018-07-19 – 2018-07-25 (×7): 0.75 mg via ORAL
  Filled 2018-07-19 (×8): qty 3

## 2018-07-19 MED ORDER — SODIUM CHLORIDE 0.9% FLUSH
3.0000 mL | Freq: Once | INTRAVENOUS | Status: AC
  Administered 2018-07-19: 18:00:00 3 mL via INTRAVENOUS

## 2018-07-19 MED ORDER — FUROSEMIDE 10 MG/ML IJ SOLN
60.0000 mg | Freq: Two times a day (BID) | INTRAMUSCULAR | Status: DC
  Administered 2018-07-20 – 2018-07-23 (×7): 60 mg via INTRAVENOUS
  Filled 2018-07-19 (×7): qty 6

## 2018-07-19 MED ORDER — SODIUM CHLORIDE 0.9% FLUSH
3.0000 mL | Freq: Two times a day (BID) | INTRAVENOUS | Status: DC
  Administered 2018-07-19 – 2018-07-25 (×10): 3 mL via INTRAVENOUS

## 2018-07-19 MED ORDER — SPIRONOLACTONE 25 MG PO TABS
25.0000 mg | ORAL_TABLET | Freq: Every day | ORAL | Status: DC
  Administered 2018-07-20 – 2018-07-25 (×6): 25 mg via ORAL
  Filled 2018-07-19 (×6): qty 1

## 2018-07-19 MED ORDER — ONDANSETRON HCL 4 MG/2ML IJ SOLN: 4.0000 mg | Freq: Four times a day (QID) | INTRAMUSCULAR | Status: DC | PRN

## 2018-07-19 MED ORDER — ACETAMINOPHEN 325 MG PO TABS: 650.0000 mg | ORAL_TABLET | ORAL | Status: DC | PRN

## 2018-07-19 MED ORDER — FINASTERIDE 5 MG PO TABS
5.0000 mg | ORAL_TABLET | Freq: Every day | ORAL | Status: DC
  Administered 2018-07-19 – 2018-07-26 (×8): 5 mg via ORAL
  Filled 2018-07-19 (×8): qty 1

## 2018-07-19 MED ORDER — FUROSEMIDE 10 MG/ML IJ SOLN
80.0000 mg | Freq: Once | INTRAMUSCULAR | Status: AC
  Administered 2018-07-19: 18:00:00 80 mg via INTRAVENOUS
  Filled 2018-07-19: qty 8

## 2018-07-19 MED ORDER — TRIAZOLAM 0.125 MG PO TABS
0.2500 mg | ORAL_TABLET | Freq: Every evening | ORAL | Status: DC | PRN
  Administered 2018-07-19 – 2018-07-25 (×7): 0.25 mg via ORAL
  Filled 2018-07-19 (×7): qty 2

## 2018-07-19 MED ORDER — POTASSIUM CHLORIDE CRYS ER 20 MEQ PO TBCR
20.0000 meq | EXTENDED_RELEASE_TABLET | Freq: Two times a day (BID) | ORAL | Status: DC
  Administered 2018-07-19 – 2018-07-25 (×12): 20 meq via ORAL
  Filled 2018-07-19 (×12): qty 1

## 2018-07-19 MED ORDER — NYSTATIN 100000 UNIT/ML MT SUSP
5.0000 mL | Freq: Four times a day (QID) | OROMUCOSAL | Status: DC
  Administered 2018-07-19 – 2018-07-26 (×26): 500000 [IU] via OROMUCOSAL
  Filled 2018-07-19 (×26): qty 5

## 2018-07-19 MED ORDER — TRAZODONE HCL 100 MG PO TABS
100.0000 mg | ORAL_TABLET | Freq: Every day | ORAL | Status: DC
  Filled 2018-07-19: qty 1

## 2018-07-19 MED ORDER — SODIUM CHLORIDE 0.9% FLUSH: 3.0000 mL | INTRAVENOUS | Status: DC | PRN

## 2018-07-19 MED ORDER — TRAMADOL HCL 50 MG PO TABS: 50.0000 mg | ORAL_TABLET | ORAL | Status: DC | PRN

## 2018-07-19 MED ORDER — SODIUM CHLORIDE 0.9 % IV SOLN: 250.0000 mL | INTRAVENOUS | Status: DC | PRN

## 2018-07-19 MED ORDER — FLUOXETINE HCL 10 MG PO CAPS
10.0000 mg | ORAL_CAPSULE | Freq: Every day | ORAL | Status: DC
  Administered 2018-07-20 – 2018-07-26 (×7): 10 mg via ORAL
  Filled 2018-07-19 (×7): qty 1

## 2018-07-19 MED ORDER — TRAMADOL HCL 50 MG PO TABS
50.0000 mg | ORAL_TABLET | ORAL | Status: DC | PRN
  Administered 2018-07-19 – 2018-07-20 (×3): 50 mg via ORAL
  Filled 2018-07-19 (×3): qty 1

## 2018-07-19 MED ORDER — PANTOPRAZOLE SODIUM 40 MG PO TBEC
40.0000 mg | DELAYED_RELEASE_TABLET | Freq: Every day | ORAL | Status: DC
  Administered 2018-07-20 – 2018-07-26 (×7): 40 mg via ORAL
  Filled 2018-07-19 (×7): qty 1

## 2018-07-19 MED ORDER — ALLOPURINOL 100 MG PO TABS
50.0000 mg | ORAL_TABLET | Freq: Every day | ORAL | Status: DC
  Administered 2018-07-19 – 2018-07-26 (×8): 50 mg via ORAL
  Filled 2018-07-19 (×8): qty 1

## 2018-07-19 NOTE — H&P (Signed)
History and Physical   Ryan [name] MWN:[id number] DOB: 02-27-1931 DOA: 07/19/2018  Referring MD/NP/PA: Dr. Eulis [name]  PCP: Ryan Noon, MD   Outpatient Specialists: [facility]  Patient coming from: Home  Chief Complaint: Worsening lower extremity edema  HPI: Ryan [name] is a 85 y.o. male with medical history significant of diastolic CHF, chronic lower extremity edema with venous insufficiency, Barrett's esophagus, BPH, gluten intolerance, hypertension, history of lymphoma, hyperlipidemia among other things who presents to the ER with worsening lower extremity edema including weeping of the feet.  He has been on diuretics.He is on torsemide and has been compliant.  Patient however appears to have anasarca at the moment.  He has exertional dyspnea.  Denied any cough.  Denied any chest pain.  Patient was seen and evaluated and found to have worsening edema.  ER has contacted his cardiologist at [facility] who recommended IV diuretics and return to Lasix instead of torsemide at discharge.  He is being admitted for treatment..  ED Course: Temperature is 99 blood pressure is 113/67 pulse 92 respiratory 28 oxygen sat 97% room air.  CBC appears to be entirely within normal.  Chemistry showed a chloride 93 otherwise glucose 133 BUN 29 creatinine 1.51.  Calcium is 8.8.  Troponin less than 0.03 BNP of 36.  Coronavirus COVID-19 screen is negative.  Chest x-ray showed large hiatal hernia with mild atelectasis.  No obvious fluids.  Patient has received 80 mg of Lasix in the ER Foley catheter inserted and he is being admitted for treatment.  Review of Systems: As per HPI otherwise 10 point review of systems negative.    Past Medical History:  Diagnosis Date  . Abdominal pain    Bloating and gas  . Asthma   . Atelectasis, bilateral 02/10/11   Noted On CT Scan - Mild Dependent Atelectasis at the Lung Bases  . Barrett's esophagus   . Benign prostatic hypertrophy   . Bilateral lower extremity edema    . Chronic fatigue   . Congestion of throat    mouth breath at night, dry mouth  . Diverticulosis of colon (without mention of hemorrhage)   . Dysrhythmia    SKIPPED HEART BEAT SOMETIMES  . Environmental allergies   . Esophageal reflux   . Gastritis   . Gluten intolerance   . Hiatal hernia 02/10/11   Noted on CT Scan - Moderate Hiatal Hernia  . Hyperlipemia   . Hypertension   . Hypertension   . Lactose intolerance   . 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 Melanoma ([place])    lymphoma  . Microcytic anemia 04/20/2011   . 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 Osteoarthritis    hands/feet,knees, NECK, BACK  . Pain    LOWER BACK PAIN - DDD, SCOLIOSIS, BONE SPUR.  FINISHED THE 3RD EPIDURAL STEROID INJECTION 7/15 - DONE BY DR. Nelva Bush. - STILL HAVING BACK PAIN  . Periaortic lymphadenopathy 02/16/2011  . Personal history of colonic polyps 2004   hyperplastic Dr. Collene Mares  . Renal cyst 02/10/11    Noted on CT Scan - Bilateral Renal Cysts  . S/P radiation therapy 03/15/11 - 04/09/11   Abdominal/ Pelvic Tumor, 3600 cGy/20 Fractions  . Sleep disorder    DOES NOT SLEEP WELL    Past Surgical History:  Procedure Laterality Date  . ARTHROSCOPIC REPAIR ACL     right  .  BONE MARROW ASPIRATION  02/25/11   Bone Marrow, Aspirate, Clot, and Bilateral Bx, Right PIC  . CATARACT EXTRACTION, BILATERAL    . EYE SURGERY    . HERNIA REPAIR     LEFT INGUINAL   . INSERTION OF MESH N/A 08/23/2012   Procedure: INSERTION OF MESH;  Surgeon: Madilyn Hook, DO;  Location: [facility];  Service: General;  Laterality: N/A;  . LUMBAR LAMINECTOMY/DECOMPRESSION MICRODISCECTOMY Right 12/31/2013   Procedure: RIGHT L4-5 L5-S1 LAMINECTOMY;  Surgeon: Kristeen Miss, MD;  Location: [facility];  Service: Neurosurgery;  Laterality: Right;  RIGHT L4-5 L5-S1 LAMINECTOMY  . ROTATOR CUFF REPAIR     right  . TONSILLECTOMY    .  TONSILLECTOMY    . VENTRAL HERNIA REPAIR N/A 08/23/2012   Procedure: HERNIA REPAIR VENTRAL ADULT;  Surgeon: Madilyn Hook, DO;  Location: [facility];  Service: General;  Laterality: N/A;     reports that he quit smoking about 26 years ago. His smoking use included pipe and cigarettes. He has a 35.00 pack-year smoking history. He has never used smokeless tobacco. He reports current alcohol use. He reports that he does not use drugs.  Allergies  Allergen Reactions  . Pollen Extract-Tree Extract Other (See Comments)    HEADACHES, TIRED , DRAINAGE FROM SINUSES  . Molds & Smuts Other (See Comments)    Also dust mites causes sinus infections, h/a etc.    Family History  Problem Relation Age of Onset  . Heart disease Father        MI 57  . Urticaria Father   . Prostate cancer Brother   . Prostate cancer Paternal Uncle   . Prostate cancer Paternal Uncle   . Prostate cancer Paternal Uncle   . Hyperlipidemia Unknown   . Stroke Unknown   . Hypertension Unknown   . ADD / ADHD Unknown   . Colon cancer Neg Hx   . Allergic rhinitis Neg Hx   . Asthma Neg Hx   . Eczema Neg Hx      Prior to Admission medications   Medication Sig Start Date End Date Taking? Authorizing Provider  allopurinol (ZYLOPRIM) 100 MG tablet Take 50 mg by mouth daily. 12/13/17  Yes [provider]  finasteride (PROSCAR) 5 MG tablet Take 1 tablet (5 mg total) by mouth daily. 12/25/12  Yes Norins, Heinz Knuckles, MD  FLUoxetine (PROZAC) 10 MG capsule Take 10 mg by mouth daily. 07/06/18  Yes [provider]  nystatin (MYCOSTATIN) 100000 UNIT/ML suspension Use as directed 5 mLs in the mouth or throat 4 (four) times daily. Swish and swallow 07/06/18  Yes [provider]  omeprazole (PRILOSEC) 20 MG capsule Take 20 mg by mouth daily.   Yes [provider]  potassium chloride SA (K-DUR,KLOR-CON) 20 MEQ tablet Take 20 mEq by mouth 2 (two) times daily.    Yes [provider]  pramipexole (MIRAPEX)  0.75 MG tablet Take 0.75 mg by mouth at bedtime.   Yes [provider]  spironolactone (ALDACTONE) 25 MG tablet Take 25 mg by mouth daily. 11/29/17  Yes [provider]  torsemide (DEMADEX) 20 MG tablet Take 40 mg by mouth 2 (two) times a day. 02/16/18 04/24/19 Yes [provider]  traMADol (ULTRAM) 50 MG tablet Take 50 mg by mouth as needed for moderate pain.    Yes [provider]  traZODone (DESYREL) 100 MG tablet Take 100 mg by mouth at bedtime. 12/13/17  Yes [provider]  triazolam (HALCION) 0.25 MG  tablet Take 1 tablet by mouth at bedtime as needed for sleep.  02/25/14  Yes [provider]    Physical Exam: Vitals:   07/19/18 1730 07/19/18 1745 07/19/18 1800 07/19/18 1815  BP: 126/77 113/67 116/80 (!) 137/96  Pulse: 81 86 85 86  Resp:      Temp:      TempSrc:      SpO2: 100% 98% 97% 97%  Weight:      Height:          Constitutional: Poor eyesight and hard of hearing, Vitals:   07/19/18 1730 07/19/18 1745 07/19/18 1800 07/19/18 1815  BP: 126/77 113/67 116/80 (!) 137/96  Pulse: 81 86 85 86  Resp:      Temp:      TempSrc:      SpO2: 100% 98% 97% 97%  Weight:      Height:       Eyes: PERRL, lids and conjunctivae normal, decreased vision ENMT: Mucous membranes are moist. Posterior pharynx clear of any exudate or lesions.Normal dentition.  Neck: normal, supple, no masses, no thyromegaly Respiratory: Coarse breath sounds with crepitus bilaterally, no wheezing, no crackles. Normal respiratory effort. No accessory muscle use.  Cardiovascular: Regular rate and rhythm, no murmurs / rubs / gallops.  4+ pedal edema, red, oozing fluids. 2+ pedal pulses. No carotid bruits.  Abdomen: no tenderness, no masses palpated. No hepatosplenomegaly. Bowel sounds positive.  Musculoskeletal: no clubbing / cyanosis. No joint deformity upper and lower extremities. Good ROM, no contractures. Normal muscle tone.  Skin: no rashes, lesions, ulcers.  No induration Neurologic: CN 2-12 grossly intact. Sensation intact, DTR normal. Strength 5/5 in all 4.  Psychiatric: Normal judgment and insight. Alert and oriented x 3. Normal mood.     Labs on Admission: I have personally reviewed following labs and imaging studies  CBC: Recent Labs  Lab 07/19/18 1452  WBC 8.9  HGB 15.0  HCT 49.5  MCV 95.0  PLT 063   Basic Metabolic Panel: Recent Labs  Lab 07/19/18 1452  NA 137  K 4.7  CL 93*  CO2 32  GLUCOSE 133*  BUN 29*  CREATININE 1.51*  CALCIUM 8.8*   GFR: Estimated Creatinine Clearance: 40.9 mL/min (A) (by C-G formula based on SCr of 1.51 mg/dL (H)). Liver Function Tests: No results for input(s): AST, ALT, ALKPHOS, BILITOT, PROT, ALBUMIN in the last 168 hours. No results for input(s): LIPASE, AMYLASE in the last 168 hours. No results for input(s): AMMONIA in the last 168 hours. Coagulation Profile: No results for input(s): INR, PROTIME in the last 168 hours. Cardiac Enzymes: Recent Labs  Lab 07/19/18 1452  TROPONINI <0.03   BNP (last 3 results) No results for input(s): PROBNP in the last 8760 hours. HbA1C: No results for input(s): HGBA1C in the last 72 hours. CBG: No results for input(s): GLUCAP in the last 168 hours. Lipid Profile: No results for input(s): CHOL, HDL, LDLCALC, TRIG, CHOLHDL, LDLDIRECT in the last 72 hours. Thyroid Function Tests: No results for input(s): TSH, T4TOTAL, FREET4, T3FREE, THYROIDAB in the last 72 hours. Anemia Panel: No results for input(s): VITAMINB12, FOLATE, FERRITIN, TIBC, IRON, RETICCTPCT in the last 72 hours. Urine analysis:    Component Value Date/Time   COLORURINE STRAW (A) 03/08/2018 1558   APPEARANCEUR CLEAR 03/08/2018 1558   LABSPEC 1.009 03/08/2018 1558   LABSPEC 1.025 06/28/2013 1321   PHURINE 7.0 03/08/2018 1558   GLUCOSEU NEGATIVE 03/08/2018 1558   GLUCOSEU Negative 06/28/2013 1321   HGBUR NEGATIVE 03/08/2018  [place] 03/08/2018 1558    BILIRUBINUR Negative 06/28/2013 1321   [place] 03/08/2018 1558   PROTEINUR NEGATIVE 03/08/2018 1558   UROBILINOGEN 1.0 01/04/2014 2156   UROBILINOGEN 0.2 06/28/2013 1321   NITRITE NEGATIVE 03/08/2018 1558   LEUKOCYTESUR NEGATIVE 03/08/2018 1558   LEUKOCYTESUR Negative 06/28/2013 1321   Sepsis Labs: _0 (procalcitonin:4,lacticidven:4) )No results found for this or any previous visit (from the past 240 hour(s)).   Radiological Exams on Admission: Dg Chest 2 View  Result Date: 07/19/2018 CLINICAL DATA:  Swelling in legs. EXAM: CHEST - 2 VIEW COMPARISON:  March 08, 2018 FINDINGS: There is mild opacity in left base, likely atelectasis. There is a large hiatal hernia behind the left side of the heart. Stable cardiomediastinal silhouette. No other acute abnormalities. IMPRESSION: Large hiatal hernia. Mild atelectasis adjacent to the left hemidiaphragm. Electronically Signed   By: Dorise Bullion III M.D   On: 07/19/2018 16:02    EKG: Independently reviewed.  EKG shows sinus rhythm with occasional PVCs.  Rate is 84.  Normal intervals.  Flattened T waves in the lateral leads but appears to be unchanged from previous EKG in June.  Assessment/Plan Principal Problem:   CHF exacerbation (HCC) Active Problems:   Hyperlipidemia   BPH (benign prostatic hyperplasia)   Chronic venous insufficiency   PEDAL EDEMA   Barrett's esophagus   Non Hodgkin's lymphoma (HCC)   Essential hypertension   Chronic diastolic heart failure (HCC)   COPD (HCC)   Bilateral lower extremity edema     #1 acute exacerbation of CHF: Patient appears to have right-sided failure.  He has a history of chronic lung disease apparently for which he has been seen at [facility].  Pulmonary fibrosis appears to be the underlying diagnosis.  He also has chronic lower extremity edema.  We will follow recommendations of his cardiologist at [facility] with IV Lasix twice daily followed by oral Lasix at discharge.  Continue other  cardiac medications.  I will obtain echocardiogram mainly looking at the right heart.  #2 hypertension: Continue home regimen at this point.  Titrate as needed.  #3 chronic venous insufficiency: Patient may benefit from compression stockings but at this point the size of his feet may not fit.  We will try that prior to discharge.  #4 GERD with Barrett's esophagus: Continue PPIs.  #5 chronic kidney disease stage III: Probably with some acute exacerbation.  We will monitor renal function especially with diuresis.  #6 difficulty hearing: Patient has reduced hearing and sight.Marland Kitchen  He is however mentally stable and fully aware of what is going on around him.  #7 hyperlipidemia: Continue statin.   DVT prophylaxis: Lovenox Code Status: Full code Family Communication: Care is discussed with patient and full Disposition Plan: To be determined Consults called: Telephone consult with patient's cardiologist at [facility] Admission status: Inpatient  Severity of Illness: The appropriate patient status for this patient is INPATIENT. Inpatient status is judged to be reasonable and necessary in order to provide the required intensity of service to ensure the patient's safety. The patient's presenting symptoms, physical exam findings, and initial radiographic and laboratory data in the context of their chronic comorbidities is felt to place them at high risk for further clinical deterioration. Furthermore, it is not anticipated that the patient will be medically stable for discharge from the hospital within 2 midnights of admission. The following factors support the patient status of inpatient.   " The patient's presenting symptoms include bilateral lower extremity edema. "  The worrisome physical exam findings include massive lower extremity edema. " The initial radiographic and laboratory data are worrisome because of acute kidney injury. " The chronic co-morbidities include history of diastolic CHF.    * I certify that at the point of admission it is my clinical judgment that the patient will require inpatient hospital care spanning beyond 2 midnights from the point of admission due to high intensity of service, high risk for further deterioration and high frequency of surveillance required.Barbette Merino MD [facility] Pager [phone]  If 7PM-7AM, please contact night-coverage [email or web address] Password [facility]  07/19/2018, 6:51 PM

## 2018-07-19 NOTE — ED Notes (Signed)
ED TO INPATIENT HANDOFF REPORT  ED Nurse Name and Phone #: Celene Squibb RN  S Name/Age/Gender Ryan [name] 85 y.o. male Room/Bed: 038C/038C  Code Status   Code Status: Prior  Home/SNF/Other Home Patient oriented to: self Is this baseline? Yes   Triage Complete: Triage complete  Chief Complaint edema/hx of chf  Triage Note Pt arrives from independent living with c/o of increase swelling in legs and abd and up 20lb per patient.    Allergies Allergies  Allergen Reactions  . Pollen Extract-Tree Extract Other (See Comments)    HEADACHES, TIRED , DRAINAGE FROM SINUSES  . Molds & Smuts Other (See Comments)    Also dust mites causes sinus infections, h/a etc.    Level of Care/Admitting Diagnosis ED Disposition    ED Disposition Condition Comment   Admit  Hospital Area: [facility] [[id number]]  Level of Care: Telemetry Cardiac [103]  Covid Evaluation: N/A  Diagnosis: CHF exacerbation [facility]) [[id number]]  Admitting Physician: Elwyn Reach [[id number]]  Attending Physician: Elwyn Reach [[id number]]  Estimated length of stay: past midnight tomorrow  Certification:: I certify this patient will need inpatient services for at least 2 midnights  PT Class (Do Not Modify): Inpatient [101]  PT Acc Code (Do Not Modify): Private [1]       B Medical/Surgery History Past Medical History:  Diagnosis Date  . Abdominal pain    Bloating and gas  . Asthma   . Atelectasis, bilateral 02/10/11   Noted On CT Scan - Mild Dependent Atelectasis at the Lung Bases  . Barrett's esophagus   . Benign prostatic hypertrophy   . Bilateral lower extremity edema   . Chronic fatigue   . Congestion of throat    mouth breath at night, dry mouth  . Diverticulosis of colon (without mention of hemorrhage)   . Dysrhythmia    SKIPPED HEART BEAT SOMETIMES  . Environmental allergies   . Esophageal reflux   . Gastritis   . Gluten intolerance   . Hiatal hernia 02/10/11   Noted on CT Scan -  Moderate Hiatal Hernia  . Hyperlipemia   . Hypertension   . Hypertension   . Lactose intolerance   . 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 Melanoma ([place])    lymphoma  . Microcytic anemia 04/20/2011   . 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 Osteoarthritis    hands/feet,knees, NECK, BACK  . Pain    LOWER BACK PAIN - DDD, SCOLIOSIS, BONE SPUR.  FINISHED THE 3RD EPIDURAL STEROID INJECTION 7/15 - DONE BY DR. Nelva Bush. - STILL HAVING BACK PAIN  . Periaortic lymphadenopathy 02/16/2011  . Personal history of colonic polyps 2004   hyperplastic Dr. Collene Mares  . Renal cyst 02/10/11    Noted on CT Scan - Bilateral Renal Cysts  . S/P radiation therapy 03/15/11 - 04/09/11   Abdominal/ Pelvic Tumor, 3600 cGy/20 Fractions  . Sleep disorder    DOES NOT SLEEP WELL   Past Surgical History:  Procedure Laterality Date  . ARTHROSCOPIC REPAIR ACL     right  . BONE MARROW ASPIRATION  02/25/11   Bone Marrow, Aspirate, Clot, and Bilateral Bx, Right PIC  . CATARACT EXTRACTION, BILATERAL    . EYE SURGERY    . HERNIA REPAIR     LEFT INGUINAL   . INSERTION OF MESH N/A 08/23/2012   Procedure: INSERTION OF  MESH;  Surgeon: Madilyn Hook, DO;  [facility];  Service: General;  Laterality: N/A;  . LUMBAR LAMINECTOMY/DECOMPRESSION MICRODISCECTOMY Right 12/31/2013   Procedure: RIGHT L4-5 L5-S1 LAMINECTOMY;  Surgeon: Kristeen Miss, MD;  Location: [place] [facility];  Service: Neurosurgery;  Laterality: Right;  RIGHT L4-5 L5-S1 LAMINECTOMY  . ROTATOR CUFF REPAIR     right  . TONSILLECTOMY    . TONSILLECTOMY    . VENTRAL HERNIA REPAIR N/A 08/23/2012   Procedure: HERNIA REPAIR VENTRAL ADULT;  Surgeon: Madilyn Hook, DO;  Location: [facility];  Service: General;  Laterality: N/A;     A IV Location/Drains/Wounds Patient Lines/Drains/Airways Status   Active Line/Drains/Airways    Name:   Placement date:   Placement time:    Site:   Days:   Peripheral IV 07/19/18 Left Antecubital   07/19/18    1436    Antecubital   less than 1   Urethral Catheter Myna Bright Non-latex;Straight-tip 16 Fr.   07/19/18    1905    Non-latex;Straight-tip   less than 1          Intake/Output Last 24 hours  Intake/Output Summary (Last 24 hours) at 07/19/2018 1921 Last data filed at 07/19/2018 1906 Gross per 24 hour  Intake -  Output 650 ml  Net -650 ml    Labs/Imaging Results for orders placed or performed during the hospital encounter of 07/19/18 (from the past 48 hour(s))  Basic metabolic panel     Status: Abnormal   Collection Time: 07/19/18  2:52 PM  Result Value Ref Range   Sodium 137 135 - 145 mmol/L   Potassium 4.7 3.5 - 5.1 mmol/L   Chloride 93 (L) 98 - 111 mmol/L   CO2 32 22 - 32 mmol/L   Glucose, Bld 133 (H) 70 - 99 mg/dL   BUN 29 (H) 8 - 23 mg/dL   Creatinine, Ser 1.51 (H) 0.61 - 1.24 mg/dL   Calcium 8.8 (L) 8.9 - 10.3 mg/dL   GFR calc non Af Amer 42 (L) >60 mL/min   GFR calc Af Amer 48 (L) >60 mL/min   Anion gap 12 5 - 15    Comment: Performed at [facility], [address] [place], [place] [address]  CBC     Status: None   Collection Time: 07/19/18  2:52 PM  Result Value Ref Range   WBC 8.9 4.0 - 10.5 K/uL   RBC 5.21 4.22 - 5.81 MIL/uL   Hemoglobin 15.0 13.0 - 17.0 g/dL   HCT 49.5 39.0 - 52.0 %   MCV 95.0 80.0 - 100.0 fL   MCH 28.8 26.0 - 34.0 pg   MCHC 30.3 30.0 - 36.0 g/dL   RDW 14.9 11.5 - 15.5 %   Platelets 231 150 - 400 K/uL   nRBC 0.0 0.0 - 0.2 %    Comment: Performed at [facility], [place] [address], [place], [place] [address]  Troponin I - ONCE - STAT     Status: None   Collection Time: 07/19/18  2:52 PM  Result Value Ref Range   Troponin I <0.03 <0.03 ng/mL    Comment: Performed at [facility], [place] [address] [place], [place] [address]  Brain natriuretic peptide     Status: None   Collection Time: 07/19/18  5:50 PM  Result Value Ref Range   B Natriuretic Peptide  36.1 0.0 - 100.0 pg/mL    Comment: Performed at [facility]  [address] [place], [place] [address]   Dg Chest 2 View  Result Date: 07/19/2018 CLINICAL DATA:  Swelling in legs. EXAM: CHEST - 2 VIEW COMPARISON:  March 08, 2018 FINDINGS: There is mild opacity in left base, likely atelectasis. There is a large hiatal hernia behind the left side of the heart. Stable cardiomediastinal silhouette. No other acute abnormalities. IMPRESSION: Large hiatal hernia. Mild atelectasis adjacent to the left hemidiaphragm. Electronically Signed   By: Dorise Bullion III M.D   On: 07/19/2018 16:02    Pending Labs Unresulted Labs (From admission, onward)    Start     Ordered   07/19/18 1856  Urinalysis, Routine w reflex microscopic  (Insert Foley)  ONCE - STAT,   STAT     07/19/18 1855   07/19/18 1750  Novel Coronavirus,NAA,(SEND-OUT TO REF LAB - TAT 24-48 hrs); Hosp Order  (Asymptomatic Patients Labs)  Once,   STAT    Question:  Rule Out  Answer:  Yes   07/19/18 1749   Signed and Held  CBC  (enoxaparin (LOVENOX)    CrCl >/= 30 ml/min)  Once,   R    Comments: Baseline for enoxaparin therapy IF NOT ALREADY DRAWN.  Notify MD if PLT < 100 K.    Signed and Held   Signed and Held  Creatinine, serum  (enoxaparin (LOVENOX)    CrCl >/= 30 ml/min)  Once,   R    Comments: Baseline for enoxaparin therapy IF NOT ALREADY DRAWN.    Signed and Held   Signed and Held  Creatinine, serum  (enoxaparin (LOVENOX)    CrCl >/= 30 ml/min)  Weekly,   R    Comments: while on enoxaparin therapy    Signed and Held   Signed and Held  CBC WITH DIFFERENTIAL  Tomorrow morning,   R     Signed and Held   Signed and Held  Basic metabolic panel  Daily,   R     Signed and Held          Vitals/Pain Today's Vitals   07/19/18 1745 07/19/18 1800 07/19/18 1815 07/19/18 1907  BP: 113/67 116/80 (!) 137/96 139/84  Pulse: 86 85 86 84  Resp:    18  Temp:      TempSrc:      SpO2: 98% 97% 97% 99%  Weight:      Height:       PainSc:        Isolation Precautions No active isolations  Medications Medications  sodium chloride flush (NS) 0.9 % injection 3 mL (3 mLs Intravenous Given 07/19/18 1816)  furosemide (LASIX) injection 80 mg (80 mg Intravenous Given 07/19/18 1813)    Mobility walks Low fall risk   Focused Assessments Cardiac Assessment Handoff:  Cardiac Rhythm: Normal sinus rhythm Lab Results  Component Value Date   TROPONINI <0.03 07/19/2018   Lab Results  Component Value Date   DDIMER 0.97 (H) 01/05/2014   Does the Patient currently have chest pain? no    R Recommendations: See Admitting Provider Note  Report given to:   Additional Notes:

## 2018-07-19 NOTE — ED Triage Notes (Signed)
Pt arrives from independent living with c/o of increase swelling in legs and abd and up 20lb per patient.

## 2018-07-19 NOTE — ED Provider Notes (Addendum)
[facility] EMERGENCY DEPARTMENT Provider Note   CSN: [id number] Arrival date & time: 07/19/18  1432    History   Chief Complaint Chief Complaint  Patient presents with   Congestive Heart Failure   Leg Swelling    HPI Ryan [name] is a 85 y.o. male.     HPI   He is here for evaluation of swelling in legs and 20 pound weight gain.  He lives in a retirement home, independently.  He states he is taking his usual prescribed medications.  He complains of shortness of breath on exertion, but does not describe orthopnea.  He denies headache, neck pain, chest pain, weakness or dizziness.  No recent similar problems.  States he has a history of pulmonary hypertension.  There are no other known modifying factors.   Past Medical History:  Diagnosis Date   Abdominal pain    Bloating and gas   Asthma    Atelectasis, bilateral 02/10/11   Noted On CT Scan - Mild Dependent Atelectasis at the Lung Bases   Barrett's esophagus    Benign prostatic hypertrophy    Bilateral lower extremity edema    Chronic fatigue    Congestion of throat    mouth breath at night, dry mouth   Diverticulosis of colon (without mention of hemorrhage)    Dysrhythmia    SKIPPED HEART BEAT SOMETIMES   Environmental allergies    Esophageal reflux    Gastritis    Gluten intolerance    Hiatal hernia 02/10/11   Noted on CT Scan - Moderate Hiatal Hernia   Hyperlipemia    Hypertension    Hypertension    Lactose intolerance    Lymphoma of lymph nodes in pelvis ([place]) 03/03/2011    Large Right Retroperitoneal Mass   Melanoma ([place])    lymphoma   Microcytic anemia 04/20/2011    NHL (non-Hodgkin's lymphoma) (HCC)    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  FINISHED THE 3RD EPIDURAL STEROID INJECTION 7/15 - DONE BY DR. Nelva Bush. - STILL HAVING BACK PAIN    Periaortic lymphadenopathy 02/16/2011   Personal history of colonic polyps 2004   hyperplastic Dr. 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   Chronic diastolic heart failure ([place]) 01/06/2014   Hypoxemia 01/05/2014   Essential hypertension 01/05/2014   Hypokalemia 01/05/2014   Urinary retention 01/05/2014   Hypoxia    Lumbar radiculopathy 12/31/2013   RLS (restless legs syndrome) 09/21/2012   Chronic lower back pain 06/15/2012   Abdominal pain, unspecified site 05/19/2012   Unspecified deficiency anemia 04/07/2012   Melanoma ([place])    Non Hodgkin's lymphoma ([place])    Periaortic lymphadenopathy 02/16/2011   Barrett's esophagus 07/09/2010   Personal history of colonic polyps 05/28/2010   History of IBS 05/28/2010  Diverticulosis of colon (without mention of hemorrhage) 05/28/2010   Arthritis involving multiple sites 05/28/2010   Wheat intolerance 05/28/2010   Chronic venous insufficiency 08/19/2009   PEDAL EDEMA 08/19/2009   INSOMNIA, CHRONIC 12/24/2008   EUSTACHIAN TUBE DYSFUNCTION, BILATERAL 12/23/2008   ERECTILE DYSFUNCTION, ORGANIC 08/01/2008   Allergic rhinitis with a nonallergic component 05/20/2008   VENTRAL HERNIA 02/12/2008   PALPITATIONS 12/25/2007   LACTOSE INTOLERANCE 10/17/2007   Hyperlipidemia 09/14/2006   HYPERTENSION 09/14/2006   BPH (benign prostatic hyperplasia) 09/14/2006   OSTEOARTHRITIS  09/14/2006   HEART MURMUR, HX OF 09/14/2006    Past Surgical History:  Procedure Laterality Date   ARTHROSCOPIC REPAIR ACL     right   BONE MARROW ASPIRATION  02/25/11   Bone Marrow, Aspirate, Clot, and Bilateral Bx, Right PIC   CATARACT EXTRACTION, BILATERAL     EYE SURGERY     HERNIA REPAIR     LEFT INGUINAL    INSERTION OF MESH N/A 08/23/2012   Procedure: INSERTION OF MESH;  Surgeon: Madilyn Hook, DO;  Location: [facility];  Service: General;  Laterality: N/A;   LUMBAR LAMINECTOMY/DECOMPRESSION MICRODISCECTOMY Right 12/31/2013   Procedure: RIGHT L4-5 L5-S1 LAMINECTOMY;  Surgeon: Kristeen Miss, MD;  Location: [facility];  Service: Neurosurgery;  Laterality: Right;  RIGHT L4-5 L5-S1 LAMINECTOMY   ROTATOR CUFF REPAIR     right   TONSILLECTOMY     TONSILLECTOMY     VENTRAL HERNIA REPAIR N/A 08/23/2012   Procedure: HERNIA REPAIR VENTRAL ADULT;  Surgeon: Madilyn Hook, DO;  Location: [facility];  Service: General;  Laterality: N/A;        Home Medications    Prior to Admission medications   Medication Sig Start Date End Date Taking? Authorizing Provider  allopurinol (ZYLOPRIM) 100 MG tablet Take 50 mg by mouth daily. 12/13/17  Yes [provider]  finasteride (PROSCAR) 5 MG tablet Take 1 tablet (5 mg total) by mouth daily. 12/25/12  Yes Norins, Heinz Knuckles, MD  FLUoxetine (PROZAC) 10 MG capsule Take 10 mg by mouth daily. 07/06/18  Yes [provider]  nystatin (MYCOSTATIN) 100000 UNIT/ML suspension Use as directed 5 mLs in the mouth or throat 4 (four) times daily. Swish and swallow 07/06/18  Yes [provider]  omeprazole (PRILOSEC) 20 MG capsule Take 20 mg by mouth daily.   Yes [provider]  potassium chloride SA (K-DUR,KLOR-CON) 20 MEQ tablet Take 20 mEq by mouth 2 (two) times daily.    Yes [provider]  pramipexole (MIRAPEX) 0.75 MG tablet Take 0.75 mg by mouth at bedtime.   Yes [provider]  spironolactone (ALDACTONE)  25 MG tablet Take 25 mg by mouth daily. 11/29/17  Yes [provider]  torsemide (DEMADEX) 20 MG tablet Take 40 mg by mouth 2 (two) times a day. 02/16/18 04/24/19 Yes [provider]  traMADol (ULTRAM) 50 MG tablet Take 50 mg by mouth as needed for moderate pain.    Yes [provider]  traZODone (DESYREL) 100 MG tablet Take 100 mg by mouth at bedtime. 12/13/17  Yes [provider]  triazolam (HALCION) 0.25 MG tablet Take 1 tablet by mouth at bedtime as needed for sleep.  02/25/14  Yes [provider]    Family History Family History  Problem Relation Age of Onset   Heart disease Father        MI 48   Urticaria Father    Prostate cancer Brother    Prostate cancer Paternal Uncle  Prostate cancer Paternal Uncle    Prostate cancer Paternal Uncle    Hyperlipidemia Unknown    Stroke Unknown    Hypertension Unknown    ADD / ADHD Unknown    Colon cancer Neg Hx    Allergic rhinitis Neg Hx    Asthma Neg Hx    Eczema Neg Hx     Social History Social History   Tobacco Use   Smoking status: Former Smoker    Packs/day: 1.00    Years: 35.00    Pack years: 35.00    Types: Pipe, Cigarettes    Quit date: 02/23/1992    Years since quitting: 26.4   Smokeless tobacco: Never Used   Tobacco comment: quit 20 years ago  Substance Use Topics   Alcohol use: Yes    Comment: 2 drinks daily scotch  AND WINE WITH SUPPER   Drug use: No     Allergies   Pollen extract-tree extract and Molds & smuts   Review of Systems Review of Systems  All other systems reviewed and are negative.    Physical Exam Updated Vital Signs BP (!) 137/96    Pulse 86    Temp 99 F (37.2 C) (Oral)    Resp (!) 28    Ht _0  (1.702 m)    Wt 99.3 kg    SpO2 97%    BMI 34.30 kg/m   Physical Exam Vitals signs and nursing note reviewed.  Constitutional:      General: He is not in acute distress.    Appearance: He is well-developed. He is obese. He is  not ill-appearing, toxic-appearing or diaphoretic.  HENT:     Head: Normocephalic and atraumatic.     Right Ear: External ear normal.     Left Ear: External ear normal.     Mouth/Throat:     Pharynx: No oropharyngeal exudate or posterior oropharyngeal erythema.  Eyes:     Conjunctiva/sclera: Conjunctivae normal.     Pupils: Pupils are equal, round, and reactive to light.  Neck:     Musculoskeletal: Normal range of motion and neck supple.     Trachea: Phonation normal.  Cardiovascular:     Rate and Rhythm: Normal rate and regular rhythm.     Heart sounds: Normal heart sounds.     Comments: No JVD Pulmonary:     Effort: Pulmonary effort is normal.     Comments: Fair air movement.  No rhonchi.  No increased work of breathing. Abdominal:     Palpations: Abdomen is soft.     Tenderness: There is no abdominal tenderness.  Musculoskeletal: Normal range of motion.     Comments: 4+ lower extremity edema bilaterally with mild erythema but no drainage or bleeding.  Skin:    General: Skin is warm and dry.  Neurological:     Mental Status: He is alert and oriented to person, place, and time.     Cranial Nerves: No cranial nerve deficit.     Sensory: No sensory deficit.     Motor: No abnormal muscle tone.     Coordination: Coordination normal.  Psychiatric:        Mood and Affect: Mood normal.        Behavior: Behavior normal.        Thought Content: Thought content normal.        Judgment: Judgment normal.      ED Treatments / Results  Labs (all labs ordered are listed, but only abnormal results are displayed)  Labs Reviewed  BASIC METABOLIC PANEL - Abnormal; Notable for the following components:      Result Value   Chloride 93 (*)    Glucose, Bld 133 (*)    BUN 29 (*)    Creatinine, Ser 1.51 (*)    Calcium 8.8 (*)    GFR calc non Af Amer 42 (*)    GFR calc Af Amer 48 (*)    All other components within normal limits  NOVEL CORONAVIRUS, NAA (HOSPITAL ORDER, SEND-OUT TO REF  LAB)  CBC  TROPONIN I  BRAIN NATRIURETIC PEPTIDE  URINALYSIS, ROUTINE W REFLEX MICROSCOPIC    EKG EKG Interpretation  Date/Time:  Wednesday July 19 2018 14:42:47 EDT Ventricular Rate:  84 PR Interval:  182 QRS Duration: 102 QT Interval:  370 QTC Calculation: 437 R Axis:   100 Text Interpretation:  Sinus rhythm with occasional Premature ventricular complexes and Premature atrial complexes Incomplete right bundle branch block Possible Right ventricular hypertrophy Abnormal ECG since last tracing no significant change Confirmed by Daleen Bo [phone]) on 07/19/2018 4:44:38 PM   Radiology Dg Chest 2 View  Result Date: 07/19/2018 CLINICAL DATA:  Swelling in legs. EXAM: CHEST - 2 VIEW COMPARISON:  March 08, 2018 FINDINGS: There is mild opacity in left base, likely atelectasis. There is a large hiatal hernia behind the left side of the heart. Stable cardiomediastinal silhouette. No other acute abnormalities. IMPRESSION: Large hiatal hernia. Mild atelectasis adjacent to the left hemidiaphragm. Electronically Signed   By: Dorise Bullion III M.D   On: 07/19/2018 16:02    Procedures .Critical Care Performed by: Daleen Bo, MD Authorized by: Daleen Bo, MD   Critical care provider statement:    Critical care time (minutes):  35   Critical care start time:  07/19/2018 2:50 PM   Critical care end time:  07/19/2018 5:51 PM   Critical care time was exclusive of:  Separately billable procedures and treating other patients   Critical care was necessary to treat or prevent imminent or life-threatening deterioration of the following conditions:  Circulatory failure   Critical care was time spent personally by me on the following activities:  Blood draw for specimens, development of treatment plan with patient or surrogate, discussions with consultants, evaluation of patient's response to treatment, examination of patient, obtaining history from patient or surrogate, ordering and performing  treatments and interventions, ordering and review of laboratory studies, pulse oximetry, re-evaluation of patient's condition, review of old charts and ordering and review of radiographic studies   (including critical care time)  Medications Ordered in ED Medications  sodium chloride flush (NS) 0.9 % injection 3 mL (3 mLs Intravenous Given 07/19/18 1816)  furosemide (LASIX) injection 80 mg (80 mg Intravenous Given 07/19/18 1813)     Initial Impression / Assessment and Plan / ED Course  I have reviewed the triage vital signs and the nursing notes.  Pertinent labs & imaging results that were available during my care of the patient were reviewed by me and considered in my medical decision making (see chart for details).  Clinical Course as of Jul 18 1899  Wed Jul 19, 2018  1642 Normal  Troponin I - ONCE - STAT [EW]  1643 Normal  CBC [EW]  1643 Normal except chloride low, glucose high, BUN high, creatinine high, calcium low, GFR low  Basic metabolic panel(!) [EW]  9675 Infiltrate or CHF, hiatal hernia is present, images viewed by me  DG Chest 2 View [EW]  1710 I discussed the case  with the patient's wife, [name].  She states that he completed a 5-day increase of torsemide to 60 twice daily, last week.  He diuresed couple pounds but has regained it since then.  She states that his pulmonologist, recommends inpatient diuresis with Lasix, at this time to improve his status.   [EW]  1747 I was able to reach the physician on-call for the pulmonary vascular disease clinic at [facility].  He recommends hospitalization for this patient for IV Lasix, 80 mg twice daily, to improve his diuresis and decrease his weight gain.  He feels that the patient may have gastrointestinal poor absorption, relative to his significant edema.   [EW]    Clinical Course User Index [EW] Daleen Bo, MD        Patient Vitals for the past 24 hrs:  BP Temp Temp src Pulse Resp SpO2 Height Weight  07/19/18 1815  (!) 137/96 -- -- 86 -- 97 % -- --  07/19/18 1800 116/80 -- -- 85 -- 97 % -- --  07/19/18 1745 113/67 -- -- 86 -- 98 % -- --  07/19/18 1730 126/77 -- -- 81 -- 100 % -- --  07/19/18 1715 (!) 136/96 -- -- 82 -- 97 % -- --  07/19/18 1700 (!) 136/99 -- -- 92 (!) 28 98 % -- --  07/19/18 1645 121/75 -- -- 83 20 99 % -- --  07/19/18 1630 134/78 -- -- 82 19 98 % -- --  07/19/18 1615 128/80 -- -- 82 20 98 % -- --  07/19/18 1600 132/78 -- -- 78 17 100 % -- --  07/19/18 1515 -- -- -- -- -- -- _0  (1.702 m) 99.3 kg  07/19/18 1440 126/85 99 F (37.2 C) Oral 78 16 100 % -- --  07/19/18 1438 -- -- -- -- -- -- -- 99.3 kg    6:10 PM Reevaluation with update and discussion. After initial assessment and treatment, an updated evaluation reveals he is fairly comfortable at this time with oxygen saturation 99% on 3-1/2 L per nasal cannula.  Findings and plan discussed with patient's wife on phone. Daleen Bo   Medical Decision Making: Patient with leg swelling, weight gain, and dyspnea upon exertion.  No prior history of severe heart failure.  He does have mild diastolic dysfunction with ejection fraction 55 to 60%, at cardiac echo, done 3 years ago.  I doubt ACS, PE or pneumonia.  CRITICAL CARE- yes Performed by: Daleen Bo  Nursing Notes Reviewed/ Care Coordinated Applicable Imaging Reviewed Interpretation of Laboratory Data incorporated into ED treatment  5:51 PM-Consult complete with hospitalist. Patient case explained and discussed.  He agrees to admit patient for further evaluation and treatment. Call ended at 6:15 PM  Plan: Admit  Final Clinical Impressions(s) / ED Diagnoses   Final diagnoses:  Peripheral edema  Urinary retention    ED Discharge Orders    None       Daleen Bo, MD 07/19/18 1818    Daleen Bo, MD 07/19/18 1901

## 2018-07-20 ENCOUNTER — Inpatient Hospital Stay (HOSPITAL_COMMUNITY): Payer: Medicare HMO

## 2018-07-20 ENCOUNTER — Encounter (HOSPITAL_COMMUNITY): Payer: Self-pay

## 2018-07-20 DIAGNOSIS — E7800 Pure hypercholesterolemia, unspecified: Secondary | ICD-10-CM

## 2018-07-20 DIAGNOSIS — R600 Localized edema: Secondary | ICD-10-CM

## 2018-07-20 DIAGNOSIS — I5033 Acute on chronic diastolic (congestive) heart failure: Secondary | ICD-10-CM

## 2018-07-20 DIAGNOSIS — R609 Edema, unspecified: Secondary | ICD-10-CM

## 2018-07-20 DIAGNOSIS — I10 Essential (primary) hypertension: Secondary | ICD-10-CM

## 2018-07-20 DIAGNOSIS — I361 Nonrheumatic tricuspid (valve) insufficiency: Secondary | ICD-10-CM

## 2018-07-20 DIAGNOSIS — I5032 Chronic diastolic (congestive) heart failure: Secondary | ICD-10-CM

## 2018-07-20 LAB — CBC WITH DIFFERENTIAL/PLATELET
Abs Immature Granulocytes: 0.04 10*3/uL (ref 0.00–0.07)
Basophils Absolute: 0 10*3/uL (ref 0.0–0.1)
Basophils Relative: 0 %
Eosinophils Absolute: 0.1 10*3/uL (ref 0.0–0.5)
Eosinophils Relative: 2 %
HCT: 50.9 % (ref 39.0–52.0)
Hemoglobin: 15.5 g/dL (ref 13.0–17.0)
Immature Granulocytes: 1 %
Lymphocytes Relative: 18 %
Lymphs Abs: 1.5 10*3/uL (ref 0.7–4.0)
MCH: 28.9 pg (ref 26.0–34.0)
MCHC: 30.5 g/dL (ref 30.0–36.0)
MCV: 95 fL (ref 80.0–100.0)
Monocytes Absolute: 0.6 10*3/uL (ref 0.1–1.0)
Monocytes Relative: 7 %
Neutro Abs: 6 10*3/uL (ref 1.7–7.7)
Neutrophils Relative %: 72 %
Platelets: 258 10*3/uL (ref 150–400)
RBC: 5.36 MIL/uL (ref 4.22–5.81)
RDW: 15.2 % (ref 11.5–15.5)
WBC: 8.2 10*3/uL (ref 4.0–10.5)
nRBC: 0 % (ref 0.0–0.2)

## 2018-07-20 LAB — BASIC METABOLIC PANEL
Anion gap: 11 (ref 5–15)
BUN: 25 mg/dL — ABNORMAL HIGH (ref 8–23)
CO2: 35 mmol/L — ABNORMAL HIGH (ref 22–32)
Calcium: 8.7 mg/dL — ABNORMAL LOW (ref 8.9–10.3)
Chloride: 91 mmol/L — ABNORMAL LOW (ref 98–111)
Creatinine, Ser: 1.41 mg/dL — ABNORMAL HIGH (ref 0.61–1.24)
GFR calc Af Amer: 53 mL/min — ABNORMAL LOW (ref >60)
GFR calc non Af Amer: 45 mL/min — ABNORMAL LOW (ref >60)
Glucose, Bld: 141 mg/dL — ABNORMAL HIGH (ref 70–99)
Potassium: 4.4 mmol/L (ref 3.5–5.1)
Sodium: 137 mmol/L (ref 135–145)

## 2018-07-20 LAB — NOVEL CORONAVIRUS, NAA (HOSP ORDER, SEND-OUT TO REF LAB; TAT 18-24 HRS): SARS-CoV-2, NAA: NOT DETECTED

## 2018-07-20 LAB — ECHOCARDIOGRAM COMPLETE
Height: 67 in
Weight: 3414.4 oz

## 2018-07-20 MED ORDER — TRAMADOL HCL 50 MG PO TABS
50.0000 mg | ORAL_TABLET | Freq: Four times a day (QID) | ORAL | Status: DC | PRN
  Administered 2018-07-21 – 2018-07-26 (×15): 50 mg via ORAL
  Filled 2018-07-20 (×15): qty 1

## 2018-07-20 MED ORDER — TRAMADOL HCL 50 MG PO TABS
50.0000 mg | ORAL_TABLET | Freq: Once | ORAL | Status: AC
  Administered 2018-07-20: 50 mg via ORAL
  Filled 2018-07-20: qty 1

## 2018-07-20 MED ORDER — TRAZODONE HCL 50 MG PO TABS
50.0000 mg | ORAL_TABLET | Freq: Every day | ORAL | Status: DC
  Administered 2018-07-21 – 2018-07-25 (×6): 50 mg via ORAL
  Filled 2018-07-20 (×6): qty 1

## 2018-07-20 NOTE — Progress Notes (Signed)
PROGRESS NOTE    Ryan [name]  RFX:[id number] DOB: 02-17-1931 DOA: 07/19/2018 PCP: Chesley Noon, MD    Brief Narrative: 85 y.o. male with medical history significant of diastolic CHF, chronic lower extremity edema with venous insufficiency, Barrett's esophagus, BPH, gluten intolerance, hypertension, history of lymphoma, hyperlipidemia among other things who presents to the ER with worsening lower extremity edema including weeping of the feet.  He has been on diuretics.He is on torsemide and has been compliant.  Patient however appears to have anasarca at the moment.  He has exertional dyspnea.  Denied any cough.  Denied any chest pain.  Patient was seen and evaluated and found to have worsening edema.  ER has contacted his cardiologist at [facility] who recommended IV diuretics and return to Lasix instead of torsemide at discharge.  He is being admitted for treatment..  ED Course: Temperature is 99 blood pressure is 113/67 pulse 92 respiratory 28 oxygen sat 97% room air.  CBC appears to be entirely within normal.  Chemistry showed a chloride 93 otherwise glucose 133 BUN 29 creatinine 1.51.  Calcium is 8.8.  Troponin less than 0.03 BNP of 36.  Coronavirus COVID-19 screen is negative.  Chest x-ray showed large hiatal hernia with mild atelectasis.  No obvious fluids.  Patient has received 80 mg of Lasix in the ER Foley catheter inserted and he is being admitted for treatment.  Assessment & Plan:   Principal Problem:   CHF exacerbation ([place]) Active Problems:   Hyperlipidemia   BPH (benign prostatic hyperplasia)   Chronic venous insufficiency   PEDAL EDEMA   Barrett's esophagus   Non Hodgkin's lymphoma (HCC)   Essential hypertension   Chronic diastolic heart failure (HCC)   COPD (HCC)   Bilateral lower extremity edema   #1 anasarca patient reports gaining at least 20 pounds in the last few weeks.  This is more of fluid weight gain.  He reports he has not been eating heavily.  He has a  history of diastolic heart failure on torsemide followed by a [facility] cardiologist Dr. Christa See.  His last echo 3 years ago ejection fraction was 55 to 60%. B NP was only 40 troponin less than 0.03 creatinine 1.41 improved since admission.  He has been compliant with his diet and medications as much as he could possibly do.  However he has been gaining fluid weight for the last few weeks.  In addition to this he has a history of pulmonary hypertension pulmonary fibrosis chronic venous insufficiency he is on 2 L of oxygen 24/7.  Follow-up echo.  I will order bilateral lower extremity Doppler.  He is currently on Lasix 60 mg IV twice a day with Aldactone 25 mg daily.  He is negative by 1.8 L since admit.  -Check LFTs.  -Patient continues with severe dyspnea on exertion and paroxysmal nocturnal dyspnea with fluid overload.  He needs to continue IV diuresis. -Cardiogram ordered not done yet  He lives in an AL.  Cardiology consulted. -Fluid restriction -Doppler of the lower extremities   #2 CKD stage III creatinine improved with IV diuresis secondary to volume overload  #3 chronic venous insufficiency with bilateral lower extremity edema worse than baseline.  They are warm to touch monitor closely.  #4 history of pulmonary fibrosis O2 dependent 2 L 24/7   #5 hypertension stable on meds  #6 GERD with Barrett's esophagus continue PPI  #7 hyperlipidemia on statins  #8 bilateral lower extremity edema erythema-I have not started him on any  antibiotics at this time hopefully this will get better with IV diuresis will follow closely.  DVT prophylaxis: Lovenox Code Status: Full code Family Communication: None patient did not wanted me to speak to anyone else he lives alone in an assisted living facility. Disposition Plan:  Pending clinical improvement  consults called: Cardiology  Estimated body mass index is 33.42 kg/m as calculated from the following:   Height as of this encounter: _0  (1.702 m).    Weight as of this encounter: 96.8 kg.   Subjective:  Patient resting in bed awake alert oriented hard of hearing reports dyspnea on exertion with any minimal activity Objective: Vitals:   07/19/18 2017 07/20/18 0018 07/20/18 0328 07/20/18 0815  BP: (!) 145/95 108/68 (!) 139/97 114/77  Pulse: 80 63 89 94  Resp: _1 Temp: (!) 97.5 F (36.4 C) 98 F (36.7 C) 97.8 F (36.6 C) 97.8 F (36.6 C)  TempSrc: Oral  Oral Oral  SpO2: 98% 98% 97% 96%  Weight: 97.5 kg  96.8 kg   Height: _2  (1.702 m)       Intake/Output Summary (Last 24 hours) at 07/20/2018 1316 Last data filed at 07/20/2018 0800 Gross per 24 hour  Intake 240 ml  Output 2050 ml  Net -1810 ml   Filed Weights   07/19/18 1515 07/19/18 2017 07/20/18 0328  Weight: 99.3 kg 97.5 kg 96.8 kg    Examination:  General exam: Appears calm and comfortable  Respiratory system: Diminished breath sounds at the bases to auscultation. Respiratory effort normal. Cardiovascular system: S1 & S2 heard, RRR. No JVD, murmurs, rubs, gallops or clicks. No pedal edema. Gastrointestinal system: Abdomen is nondistended, soft and nontender. No organomegaly or masses felt. Normal bowel sounds heard. Central nervous system: Alert and oriented. No focal neurological deficits. Extremities: 3+ pitting edema erythema warm to touch skin: No rashes, lesions or ulcers Psychiatry: Judgement and insight appear normal. Mood & affect appropriate.     Data Reviewed: I have personally reviewed following labs and imaging studies  CBC: Recent Labs  Lab 07/19/18 1452 07/20/18 0408  WBC 8.9 8.2  NEUTROABS  --  6.0  HGB 15.0 15.5  HCT 49.5 50.9  MCV 95.0 95.0  PLT 231 779   Basic Metabolic Panel: Recent Labs  Lab 07/19/18 1452 07/20/18 0408  NA 137 137  K 4.7 4.4  CL 93* 91*  CO2 32 35*  GLUCOSE 133* 141*  BUN 29* 25*  CREATININE 1.51* 1.41*  CALCIUM 8.8* 8.7*   GFR: Estimated Creatinine Clearance: 43.2 mL/min (A) (by C-G formula  based on SCr of 1.41 mg/dL (H)). Liver Function Tests: No results for input(s): AST, ALT, ALKPHOS, BILITOT, PROT, ALBUMIN in the last 168 hours. No results for input(s): LIPASE, AMYLASE in the last 168 hours. No results for input(s): AMMONIA in the last 168 hours. Coagulation Profile: No results for input(s): INR, PROTIME in the last 168 hours. Cardiac Enzymes: Recent Labs  Lab 07/19/18 1452  TROPONINI <0.03   BNP (last 3 results) No results for input(s): PROBNP in the last 8760 hours. HbA1C: No results for input(s): HGBA1C in the last 72 hours. CBG: No results for input(s): GLUCAP in the last 168 hours. Lipid Profile: No results for input(s): CHOL, HDL, LDLCALC, TRIG, CHOLHDL, LDLDIRECT in the last 72 hours. Thyroid Function Tests: No results for input(s): TSH, T4TOTAL, FREET4, T3FREE, THYROIDAB in the last 72 hours. Anemia Panel: No results for input(s): VITAMINB12, FOLATE, FERRITIN, TIBC, IRON, RETICCTPCT in  the last 72 hours. Sepsis Labs: No results for input(s): PROCALCITON, LATICACIDVEN in the last 168 hours.  No results found for this or any previous visit (from the past 240 hour(s)).       Radiology Studies: Dg Chest 2 View  Result Date: 07/19/2018 CLINICAL DATA:  Swelling in legs. EXAM: CHEST - 2 VIEW COMPARISON:  March 08, 2018 FINDINGS: There is mild opacity in left base, likely atelectasis. There is a large hiatal hernia behind the left side of the heart. Stable cardiomediastinal silhouette. No other acute abnormalities. IMPRESSION: Large hiatal hernia. Mild atelectasis adjacent to the left hemidiaphragm. Electronically Signed   By: Dorise Bullion III M.D   On: 07/19/2018 16:02        Scheduled Meds: . allopurinol  50 mg Oral Daily  . enoxaparin (LOVENOX) injection  40 mg Subcutaneous Q24H  . finasteride  5 mg Oral Daily  . FLUoxetine  10 mg Oral Daily  . furosemide  60 mg Intravenous BID  . nystatin  5 mL Mouth/Throat QID  . pantoprazole  40 mg Oral  Daily  . potassium chloride SA  20 mEq Oral BID  . pramipexole  0.75 mg Oral QHS  . sodium chloride flush  3 mL Intravenous Q12H  . spironolactone  25 mg Oral Daily  . traZODone  100 mg Oral QHS   Continuous Infusions: . sodium chloride       LOS: 1 day      Georgette Shell, MD [facility]   If 7PM-7AM, please contact night-coverage [email or web address] Password [email or web address] 07/20/2018, 1:16 PM

## 2018-07-20 NOTE — Plan of Care (Signed)
  Problem: Education: Goal: Knowledge of General Education information will improve Description: Including pain rating scale, medication(s)/side effects and non-pharmacologic comfort measures Outcome: Progressing   Problem: Health Behavior/Discharge Planning: Goal: Ability to manage health-related needs will improve Outcome: Progressing   Problem: Clinical Measurements: Goal: Ability to maintain clinical measurements within normal limits will improve Outcome: Progressing   Problem: Activity: Goal: Risk for activity intolerance will decrease Outcome: Progressing   Problem: Pain Managment: Goal: General experience of comfort will improve Outcome: Progressing   Problem: Safety: Goal: Ability to remain free from injury will improve Outcome: Progressing   Problem: Education: Goal: Ability to demonstrate management of disease process will improve Outcome: Progressing   Problem: Activity: Goal: Capacity to carry out activities will improve Outcome: Progressing   Problem: Cardiac: Goal: Ability to achieve and maintain adequate cardiopulmonary perfusion will improve Outcome: Progressing

## 2018-07-20 NOTE — Progress Notes (Signed)
Lower extremity venous has been completed.   Preliminary results in CV Proc.   Abram Sander 07/20/2018 5:06 PM

## 2018-07-20 NOTE — Progress Notes (Signed)
2D Echocardiogram has been performed.  Ryan [name] 07/20/2018, 3:04 PM

## 2018-07-20 NOTE — Consult Note (Signed)
Cardiology Consultation:   Patient ID: Ryan [name]; [id number]; [birth year]   Admit date: 07/19/2018 Date of Consult: 07/20/2018  Primary Care Provider: Chesley Noon, MD Primary Cardiologist: Dr. Christa See, [facility] Primary Electrophysiologist:  None    Patient Profile:   Ryan [name] is a 85 y.o. male with a PMH of chronic diastolic CHF, pulmonary HTN, HTN, pulmonary fibrosis, and barrett's esophagus, who is being seen today for the evaluation of CHF at the request of Dr. Rodena Piety.  History of Present Illness:   Mr. Cangelosi resides at an independent living facility. He presented to the ED 07/19/2018 with complaints of 20lb weight gain and worsening LE edema despite compliance with home torsemide 54m BID. He had complaints of DOE but no SOB at rest, orthopnea, chest pain, dizziness, lightheadedness, or syncope.   He was last evaluated by cardiology at an outpatient visit with Dr. PChrista See1/2020. He underwent a RHC 03/2018 which showed RV systolic pressure of 60 mmHg and mean pulm artery pressure 42mg; full report not available for review.   Hospital course: BP intermittently elevated, intermittently tachypneic, otherwise VSS. Labs notable for electrolytes wnl, Cr 1.5>1.4, CBC wnl, BNP 36, Trop negative x1. COVID 19 negative. CXR without acute findings. EKG with sinus rhythm with chronic incomplete RBBB, PVC, no STE/D, no TWI. Echo today with EF 60-65%, mild LVH, G1DD, normal RV function/size, and moderate mitral annular calcifications.Patient was given IV lasix 8073mn the ED and admitted to medicine. IV lasix 1m58mD continued. Cardiology asked to evaluate for CHF.   Past Medical History:  Diagnosis Date  . Abdominal pain    Bloating and gas  . Asthma   . Atelectasis, bilateral 02/10/11   Noted On CT Scan - Mild Dependent Atelectasis at the Lung Bases  . Barrett's esophagus   . Benign prostatic hypertrophy   . Bilateral lower extremity edema   . Chronic fatigue   .  Congestion of throat    mouth breath at night, dry mouth  . Diverticulosis of colon (without mention of hemorrhage)   . Dysrhythmia    SKIPPED HEART BEAT SOMETIMES  . Environmental allergies   . Esophageal reflux   . Gastritis   . Gluten intolerance   . Hiatal hernia 02/10/11   Noted on CT Scan - Moderate Hiatal Hernia  . Hyperlipemia   . Hypertension   . Hypertension   . Lactose intolerance   . 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 Melanoma (HCC)[place] lymphoma  . Microcytic anemia 04/20/2011   . 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 Osteoarthritis    hands/feet,knees, NECK, BACK  . Pain    LOWER BACK PAIN - DDD, SCOLIOSIS, BONE SPUR.  FINISHED THE 3RD EPIDURAL STEROID INJECTION 7/15 - DONE BY DR. RAMONelva BushSTILL HAVING BACK PAIN  . Periaortic lymphadenopathy 02/16/2011  . Personal history of colonic polyps 2004   hyperplastic Dr. MannCollene MaresRenal cyst 02/10/11    Noted on CT Scan - Bilateral Renal Cysts  . S/P radiation therapy 03/15/11 - 04/09/11   Abdominal/ Pelvic Tumor, 3600 cGy/20 Fractions  . Sleep disorder    DOES NOT SLEEP WELL    Past Surgical History:  Procedure Laterality Date  . ARTHROSCOPIC REPAIR ACL     right  . BONE MARROW ASPIRATION  02/25/11   Bone Marrow, Aspirate, Clot, and Bilateral Bx,  Right PIC  . CATARACT EXTRACTION, BILATERAL    . EYE SURGERY    . HERNIA REPAIR     LEFT INGUINAL   . INSERTION OF MESH N/A 08/23/2012   Procedure: INSERTION OF MESH;  Surgeon: Madilyn Hook, DO;  Location: [facility];  Service: General;  Laterality: N/A;  . LUMBAR LAMINECTOMY/DECOMPRESSION MICRODISCECTOMY Right 12/31/2013   Procedure: RIGHT L4-5 L5-S1 LAMINECTOMY;  Surgeon: Kristeen Miss, MD;  Location: [facility];  Service: Neurosurgery;  Laterality: Right;  RIGHT L4-5 L5-S1 LAMINECTOMY  . ROTATOR CUFF REPAIR     right  . TONSILLECTOMY    . TONSILLECTOMY    . VENTRAL  HERNIA REPAIR N/A 08/23/2012   Procedure: HERNIA REPAIR VENTRAL ADULT;  Surgeon: Madilyn Hook, DO;  Location: [facility];  Service: General;  Laterality: N/A;     Home Medications:  Prior to Admission medications   Medication Sig Start Date End Date Taking? Authorizing Provider  allopurinol (ZYLOPRIM) 100 MG tablet Take 50 mg by mouth daily. 12/13/17  Yes [provider]  finasteride (PROSCAR) 5 MG tablet Take 1 tablet (5 mg total) by mouth daily. 12/25/12  Yes Norins, Heinz Knuckles, MD  FLUoxetine (PROZAC) 10 MG capsule Take 10 mg by mouth daily. 07/06/18  Yes [provider]  nystatin (MYCOSTATIN) 100000 UNIT/ML suspension Use as directed 5 mLs in the mouth or throat 4 (four) times daily. Swish and swallow 07/06/18  Yes [provider]  omeprazole (PRILOSEC) 20 MG capsule Take 20 mg by mouth daily.   Yes [provider]  potassium chloride SA (K-DUR,KLOR-CON) 20 MEQ tablet Take 20 mEq by mouth 2 (two) times daily.    Yes [provider]  pramipexole (MIRAPEX) 0.75 MG tablet Take 0.75 mg by mouth at bedtime.   Yes [provider]  spironolactone (ALDACTONE) 25 MG tablet Take 25 mg by mouth daily. 11/29/17  Yes [provider]  torsemide (DEMADEX) 20 MG tablet Take 40 mg by mouth 2 (two) times a day. 02/16/18 04/24/19 Yes [provider]  traMADol (ULTRAM) 50 MG tablet Take 50 mg by mouth as needed for moderate pain.    Yes [provider]  traZODone (DESYREL) 100 MG tablet Take 100 mg by mouth at bedtime. 12/13/17  Yes [provider]  triazolam (HALCION) 0.25 MG tablet Take 1 tablet by mouth at bedtime as needed for sleep.  02/25/14  Yes [provider]    Inpatient Medications: Scheduled Meds: . allopurinol  50 mg Oral Daily  . enoxaparin (LOVENOX) injection  40 mg Subcutaneous Q24H  . finasteride  5 mg Oral Daily  . FLUoxetine  10 mg Oral Daily  . furosemide  60 mg Intravenous BID  . nystatin  5 mL  Mouth/Throat QID  . pantoprazole  40 mg Oral Daily  . potassium chloride SA  20 mEq Oral BID  . pramipexole  0.75 mg Oral QHS  . sodium chloride flush  3 mL Intravenous Q12H  . spironolactone  25 mg Oral Daily  . traZODone  50 mg Oral QHS   Continuous Infusions: . sodium chloride     PRN Meds: sodium chloride, acetaminophen, ondansetron (ZOFRAN) IV, sodium chloride flush, traMADol, triazolam  Allergies:    Allergies  Allergen Reactions  . Pollen Extract-Tree Extract Other (See Comments)    HEADACHES, TIRED , DRAINAGE FROM SINUSES  . Molds & Smuts Other (See Comments)    Also dust mites causes sinus infections, h/a etc.    Social History:  Social History   Socioeconomic History  . Marital status: Married    Spouse name: Not on file  . Number of children: 2  . Years of education: Not on file  . Highest education level: Not on file  Occupational History  . 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 Financial resource strain: Not on file  . Food insecurity    Worry: Not on file    Inability: Not on file  . Transportation needs    Medical: Not on file    Non-medical: Not on file  Tobacco Use  . Smoking status: Former Smoker    Packs/day: 1.00    Years: 35.00    Pack years: 35.00    Types: Pipe, Cigarettes    Quit date: 02/23/1992    Years since quitting: 26.4  . Smokeless tobacco: Never Used  . Tobacco comment: quit 20 years ago  Substance and Sexual Activity  . Alcohol use: Yes    Comment: 2 drinks daily scotch  AND WINE WITH SUPPER  . Drug use: No  . Sexual activity: Not on file  Lifestyle  . Physical activity    Days per week: Not on file    Minutes per session: Not on file  . Stress: Not on file  Relationships  . Social Herbalist on phone: Not on file    Gets together: Not on file    Attends religious service: Not on file    Active member of club or organization: Not on file    Attends meetings of clubs  or organizations: Not on file    Relationship status: Not on file  . Intimate partner violence    Fear of current or ex partner: Not on file    Emotionally abused: Not on file    Physically abused: Not on file    Forced sexual activity: Not on file  Other Topics Concern  . Not on file  Social History Narrative   Married - second marriage   He has two children   Retired Horticulturist, commercial Professor   Currently teaches pottery making, 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[facility]   Former Smoker quit 12 years ago- smoked for 35 years   Alcohol use- yes    Family History:    Family History  Problem Relation Age of Onset  . Heart disease Father        MI 69  . Urticaria Father   . Prostate cancer Brother   . Prostate cancer Paternal Uncle   . Prostate cancer Paternal Uncle   . Prostate cancer Paternal Uncle   . Hyperlipidemia Other   . Stroke Other   . Hypertension Other   . ADD / ADHD Other   . Colon cancer Neg Hx   . Allergic rhinitis Neg Hx   . Asthma Neg Hx   . Eczema Neg Hx      ROS:  Please see the history of present illness.   All other ROS reviewed and negative.     Physical Exam/Data:   Vitals:   07/19/18 2017 07/20/18 0018 07/20/18 0328 07/20/18 0815  BP: (!) 145/95 108/68 (!) 139/97 114/77  Pulse: 80 63 89 94  Resp: _0 Temp: (!) 97.5 F (36.4 C) 98 F (36.7 C) 97.8 F (36.6 C) 97.8 F (36.6 C)  TempSrc: Oral  Oral Oral  SpO2: 98% 98% 97% 96%  Weight: 97.5 kg  96.8  kg   Height: 5' 7" (1.702 m)       Intake/Output Summary (Last 24 hours) at 07/20/2018 1548 Last data filed at 07/20/2018 0800 Gross per 24 hour  Intake 240 ml  Output 2050 ml  Net -1810 ml   Filed Weights   07/19/18 1515 07/19/18 2017 07/20/18 0328  Weight: 99.3 kg 97.5 kg 96.8 kg   Body mass index is 33.42 kg/m.  Physical exam per MD below  EKG:  The EKG was personally reviewed and demonstrates:  Sinus rhythm with PVC, incomplete RBBB, no STE/D, no TWI.    Relevant CV Studies:  Echocardiogram 07/20/2018: IMPRESSIONS    1. The left ventricle has normal systolic function with an ejection fraction of 60-65%. The cavity size was normal. There is mildly increased left ventricular wall thickness. Left ventricular diastolic Doppler parameters are consistent with impaired  relaxation.  2. The right ventricle has normal systolic function. The cavity was normal. There is no increase in right ventricular wall thickness.  3. There is moderate mitral annular calcification present.  Laboratory Data:  Chemistry Recent Labs  Lab 07/19/18 1452 07/20/18 0408  NA 137 137  K 4.7 4.4  CL 93* 91*  CO2 32 35*  GLUCOSE 133* 141*  BUN 29* 25*  CREATININE 1.51* 1.41*  CALCIUM 8.8* 8.7*  GFRNONAA 42* 45*  GFRAA 48* 53*  ANIONGAP 12 11    No results for input(s): PROT, ALBUMIN, AST, ALT, ALKPHOS, BILITOT in the last 168 hours. Hematology Recent Labs  Lab 07/19/18 1452 07/20/18 0408  WBC 8.9 8.2  RBC 5.21 5.36  HGB 15.0 15.5  HCT 49.5 50.9  MCV 95.0 95.0  MCH 28.8 28.9  MCHC 30.3 30.5  RDW 14.9 15.2  PLT 231 258   Cardiac Enzymes Recent Labs  Lab 07/19/18 1452  TROPONINI <0.03   No results for input(s): TROPIPOC in the last 168 hours.  BNP Recent Labs  Lab 07/19/18 1750  BNP 36.1    DDimer No results for input(s): DDIMER in the last 168 hours.  Radiology/Studies:  Dg Chest 2 View  Result Date: 07/19/2018 CLINICAL DATA:  Swelling in legs. EXAM: CHEST - 2 VIEW COMPARISON:  March 08, 2018 FINDINGS: There is mild opacity in left base, likely atelectasis. There is a large hiatal hernia behind the left side of the heart. Stable cardiomediastinal silhouette. No other acute abnormalities. IMPRESSION: Large hiatal hernia. Mild atelectasis adjacent to the left hemidiaphragm. Electronically Signed   By: Dorise Bullion III M.D   On: 07/19/2018 16:02    Assessment and Plan:   1. Acute on chronic diastolic CHF/Anasarca: patient presented with weight gain of  20lbs and LE edema c/b weeping of the feet. Also with DOE. He reports compliance with his torsemide. He was referred to the ED for IV diuresis by his outpatient pulmonologist after a telemedicine visit 07/19/2018.  BNP 36.1.  Chest x-ray without pulmonary edema. Echo today with EF 60-65%, mild LVH, G1DD, normal RV function/size, and moderate mitral annular calcifications. He was started on IV Lasix 71m BID with urine output net -910 mL. Weight is down 4lbs from yesterday to - 213lb today. [facility] - Continue IV lasix - could consider increasing dose to 821mBID (on po torsemide 4067mID prior to admission) - Continue spironolactone - Continue to monitor strict I&Os and daily weights.  2. [facility]N: [facility]. HTN:  BP intermittently elevated but overall stable - Continue lasix and spironolactone - Further recommendations pending response to diuresis  4. Pulmonary fibrosis: CXR without acute findings. No change in O2 requirements (2L) - Continue management per primary team  5. CKD stage 3: Cr 1.5 on admission, improved to 1.4 with IV diuresis.  - Continue to monitor closely with diuresis.   6. Chronic venous insufficiency: LE edema worse than baseline. Mildly erythematous but no evidence of infection at this time. Afebrile and no leukocytosis. LE dopplers pending to r/o DVT - Continue management per primary team.   Dr. Radford Pax to see for further recommendations.    For questions or updates, please contact [facility] Please consult [email or web address] for contact info under Cardiology/STEMI.   Signed, Abigail Butts, PA-C  07/20/2018 3:48 PM [phone]

## 2018-07-21 LAB — CBC WITH DIFFERENTIAL/PLATELET
Abs Immature Granulocytes: 0.04 10*3/uL (ref 0.00–0.07)
Basophils Absolute: 0 10*3/uL (ref 0.0–0.1)
Basophils Relative: 0 %
Eosinophils Absolute: 0.1 10*3/uL (ref 0.0–0.5)
Eosinophils Relative: 1 %
HCT: 49.3 % (ref 39.0–52.0)
Hemoglobin: 15.2 g/dL (ref 13.0–17.0)
Immature Granulocytes: 1 %
Lymphocytes Relative: 19 %
Lymphs Abs: 1.5 10*3/uL (ref 0.7–4.0)
MCH: 29.1 pg (ref 26.0–34.0)
MCHC: 30.8 g/dL (ref 30.0–36.0)
MCV: 94.3 fL (ref 80.0–100.0)
Monocytes Absolute: 0.6 10*3/uL (ref 0.1–1.0)
Monocytes Relative: 8 %
Neutro Abs: 5.6 10*3/uL (ref 1.7–7.7)
Neutrophils Relative %: 71 %
Platelets: 220 10*3/uL (ref 150–400)
RBC: 5.23 MIL/uL (ref 4.22–5.81)
RDW: 15.1 % (ref 11.5–15.5)
WBC: 7.9 10*3/uL (ref 4.0–10.5)
nRBC: 0 % (ref 0.0–0.2)

## 2018-07-21 LAB — COMPREHENSIVE METABOLIC PANEL
ALT: 27 U/L (ref 0–44)
AST: 25 U/L (ref 15–41)
Albumin: 3.3 g/dL — ABNORMAL LOW (ref 3.5–5.0)
Alkaline Phosphatase: 83 U/L (ref 38–126)
Anion gap: 11 (ref 5–15)
BUN: 27 mg/dL — ABNORMAL HIGH (ref 8–23)
CO2: 30 mmol/L (ref 22–32)
Calcium: 8.4 mg/dL — ABNORMAL LOW (ref 8.9–10.3)
Chloride: 95 mmol/L — ABNORMAL LOW (ref 98–111)
Creatinine, Ser: 1.54 mg/dL — ABNORMAL HIGH (ref 0.61–1.24)
GFR calc Af Amer: 47 mL/min — ABNORMAL LOW (ref >60)
GFR calc non Af Amer: 41 mL/min — ABNORMAL LOW (ref >60)
Glucose, Bld: 125 mg/dL — ABNORMAL HIGH (ref 70–99)
Potassium: 4.3 mmol/L (ref 3.5–5.1)
Sodium: 136 mmol/L (ref 135–145)
Total Bilirubin: 0.7 mg/dL (ref 0.3–1.2)
Total Protein: 6.1 g/dL — ABNORMAL LOW (ref 6.5–8.1)

## 2018-07-21 MED ORDER — FLUTICASONE PROPIONATE 50 MCG/ACT NA SUSP
1.0000 | Freq: Every day | NASAL | Status: DC
  Administered 2018-07-21 – 2018-07-26 (×6): 1 via NASAL
  Filled 2018-07-21: qty 16

## 2018-07-21 NOTE — Care Management Important Message (Signed)
Important Message  Patient Details  Name: Ryan [name] MRN: [id number] Date of Birth: 08-Nov-1931   Medicare Important Message Given:  Yes     Shelda Altes 07/21/2018, 11:55 AM

## 2018-07-21 NOTE — Progress Notes (Signed)
PROGRESS NOTE    Ryan [name]  SHF:[id number] DOB: 17-Aug-1931 DOA: 07/19/2018 PCP: Chesley Noon, MD  Brief Narrative: 85 y.o.malewith medical history significant ofdiastolic CHF, chronic lower extremity edema with venous insufficiency, Barrett's esophagus, BPH, gluten intolerance, hypertension, history of lymphoma, hyperlipidemia among other things who presents to the ER with worsening lower extremity edema including weeping of the feet. He has been on diuretics.He is on torsemide and has been compliant.Patient however appears to have anasarca at the moment. He has exertional dyspnea. Denied any cough. Denied any chest pain. Patient was seen and evaluated and found to have worsening edema. ER has contacted his cardiologist at [facility] who recommended IV diuretics and return to Lasix instead of torsemide at discharge. He is being admitted for treatment..  ED Course:Temperature is 99 blood pressure is 113/67 pulse 92 respiratory 28 oxygen sat 97% room air. CBC appears to be entirely within normal. Chemistry showed a chloride 93 otherwise glucose 133 BUN 29 creatinine 1.51. Calcium is 8.8. Troponin less than 0.03 BNP of 36. Coronavirus COVID-19 screen is negative. Chest x-ray showed large hiatal hernia with mild atelectasis. No obvious fluids. Patient has received 80 mg of Lasix in the ER Foley catheter inserted and he is being admitted for treatment.  Assessment & Plan:   Principal Problem:   CHF exacerbation ([place]) Active Problems:   Hyperlipidemia   BPH (benign prostatic hyperplasia)   Chronic venous insufficiency   PEDAL EDEMA   Barrett's esophagus   Non Hodgkin's lymphoma (HCC)   Essential hypertension   Chronic diastolic heart failure (HCC)   COPD (HCC)   Bilateral lower extremity edema   Acute on chronic diastolic heart failure ([place])   #1 anasarca  unclear etiology patient reports he was taking all his medications as prescribed and was monitoring his diet  except during COVID he had to get fluid food from outside.  Followed at [facility] by [facility] cardiologist Dr. Christa See.  He has been on IV Lasix since admission.  He is negative by 3.6 L since admission.Marland Kitchen  He does have a Foley catheter in place.  His creatinine also trended up.  Cardiology has consulted heart failure team.  Echocardiogram this admission shows normal ejection fraction with grade 1 diastolic dysfunction.  His LFTs have been normal albumin is 3.3.Marland Kitchen  -Patient continues with severe dyspnea on exertion and paroxysmal nocturnal dyspnea with fluid overload.  He needs to continue IV diuresis. -Doppler of the lower extremities done results pending.   #2 CKD stage III creatinine monitor on IV diuresis creatinine trending up.  #3 chronic venous insufficiency with bilateral lower extremity edema worse than baseline.  They are warm to touch monitor closely.  Have not started any antibiotics at this time.  #4 history of pulmonary fibrosis O2 dependent 2 L 24/7   #5 hypertension stable on meds  #6 GERD with Barrett's esophagus continue PPI  #7 hyperlipidemia on statins  #8 bilateral lower extremity edema erythema-slightly better than yesterday with ongoing diuresis.    DVT prophylaxis:Lovenox Code Status:Full code Family Communication: None patient did not wanted me to speak to anyone else he lives alone in an assisted living facility. Disposition Plan: Pending clinical improvement  consults called: Cardiology   Estimated body mass index is 33.56 kg/m as calculated from the following:   Height as of this encounter: _0  (1.702 m).   Weight as of this encounter: 97.2 kg.    Subjective: Patient resting in bed wanting to eat breakfast standing up he is  not sure about his breathing whether is better or not  Objective: Vitals:   07/20/18 2020 07/21/18 0008 07/21/18 0543 07/21/18 0850  BP:  132/81 105/76 128/84  Pulse:  91 87 85  Resp:  _0 Temp: (!) 97.5 F (36.4 C) (!)  97.5 F (36.4 C) 97.7 F (36.5 C) (!) 97.4 F (36.3 C)  TempSrc: Oral  Oral Oral  SpO2:  96% 96% 97%  Weight:   97.2 kg   Height:        Intake/Output Summary (Last 24 hours) at 07/21/2018 1143 Last data filed at 07/21/2018 0830 Gross per 24 hour  Intake 603 ml  Output 2450 ml  Net -1847 ml   Filed Weights   07/19/18 2017 07/20/18 0328 07/21/18 0543  Weight: 97.5 kg 96.8 kg 97.2 kg    Examination:  General exam: Appears calm and comfortable  Respiratory system: Crackles bilateral bases to auscultation. Respiratory effort normal. Cardiovascular system: S1 & S2 heard, RRR. No JVD, murmurs, rubs, gallops or clicks. No pedal edema. Gastrointestinal system: Abdomen is nondistended, soft and nontender. No organomegaly or masses felt. Normal bowel sounds heard. Central nervous system: Alert and oriented. No focal neurological deficits. Extremities: 3+ pitting edema  skin: No rashes, lesions or ulcers Psychiatry: Judgement and insight appear normal. Mood & affect appropriate.     Data Reviewed: I have personally reviewed following labs and imaging studies  CBC: Recent Labs  Lab 07/19/18 1452 07/20/18 0408 07/21/18 0607  WBC 8.9 8.2 7.9  NEUTROABS  --  6.0 5.6  HGB 15.0 15.5 15.2  HCT 49.5 50.9 49.3  MCV 95.0 95.0 94.3  PLT 231 258 284   Basic Metabolic Panel: Recent Labs  Lab 07/19/18 1452 07/20/18 0408 07/21/18 0607  NA 137 137 136  K 4.7 4.4 4.3  CL 93* 91* 95*  CO2 32 35* 30  GLUCOSE 133* 141* 125*  BUN 29* 25* 27*  CREATININE 1.51* 1.41* 1.54*  CALCIUM 8.8* 8.7* 8.4*   GFR: Estimated Creatinine Clearance: 39.6 mL/min (A) (by C-G formula based on SCr of 1.54 mg/dL (H)). Liver Function Tests: Recent Labs  Lab 07/21/18 0607  AST 25  ALT 27  ALKPHOS 83  BILITOT 0.7  PROT 6.1*  ALBUMIN 3.3*   No results for input(s): LIPASE, AMYLASE in the last 168 hours. No results for input(s): AMMONIA in the last 168 hours. Coagulation Profile: No results for  input(s): INR, PROTIME in the last 168 hours. Cardiac Enzymes: Recent Labs  Lab 07/19/18 1452  TROPONINI <0.03   BNP (last 3 results) No results for input(s): PROBNP in the last 8760 hours. HbA1C: No results for input(s): HGBA1C in the last 72 hours. CBG: No results for input(s): GLUCAP in the last 168 hours. Lipid Profile: No results for input(s): CHOL, HDL, LDLCALC, TRIG, CHOLHDL, LDLDIRECT in the last 72 hours. Thyroid Function Tests: No results for input(s): TSH, T4TOTAL, FREET4, T3FREE, THYROIDAB in the last 72 hours. Anemia Panel: No results for input(s): VITAMINB12, FOLATE, FERRITIN, TIBC, IRON, RETICCTPCT in the last 72 hours. Sepsis Labs: No results for input(s): PROCALCITON, LATICACIDVEN in the last 168 hours.  Recent Results (from the past 240 hour(s))  Novel Coronavirus,NAA,(SEND-OUT TO REF LAB - TAT 24-48 hrs); Hosp Order     Status: None   Collection Time: 07/19/18  5:50 PM   Specimen: Nasopharyngeal Swab; Respiratory  Result Value Ref Range Status   SARS-CoV-2, NAA NOT DETECTED NOT DETECTED Final    Comment: (NOTE) This test  was developed and its performance characteristics determined by [employer]. This test has not been FDA cleared or approved. This test has been authorized by FDA under an Emergency Use Authorization (EUA). This test is only authorized for the duration of time the declaration that circumstances exist justifying the authorization of the emergency use of in vitro diagnostic tests for detection of SARS-CoV-2 virus and/or diagnosis of COVID-19 infection under section 564(b)(1) of the Act, 21 U.S.C. 606TKZ-6(W)(1), unless the authorization is terminated or revoked sooner. When diagnostic testing is negative, the possibility of a false negative result should be considered in the context of a patient's recent exposures and the presence of clinical signs and symptoms consistent with COVID-19. An individual without symptoms of COVID-19 and  who is not shedding SARS-CoV-2 virus would expect to have a negative (not detected) result in this assay. Performed  At: [facility] [address] [place], [place] [id number] Rush Farmer MD UK:[id number]    [place]  Final    Comment: Performed at [facility], [place] [address] [place], [place] [address]         Radiology Studies: Dg Chest 2 View  Result Date: 07/19/2018 CLINICAL DATA:  Swelling in legs. EXAM: CHEST - 2 VIEW COMPARISON:  March 08, 2018 FINDINGS: There is mild opacity in left base, likely atelectasis. There is a large hiatal hernia behind the left side of the heart. Stable cardiomediastinal silhouette. No other acute abnormalities. IMPRESSION: Large hiatal hernia. Mild atelectasis adjacent to the left hemidiaphragm. Electronically Signed   By: Dorise Bullion III M.D   On: 07/19/2018 16:02   Vas [place] Lower Extremity Venous (dvt)  Result Date: 07/20/2018  Lower Venous Study Indications: Edema.  Limitations: Body habitus and poor ultrasound/tissue interface. Performing Technologist: Abram Sander RVS  Examination Guidelines: A complete evaluation includes B-mode imaging, spectral Doppler, color Doppler, and power Doppler as needed of all accessible portions of each vessel. Bilateral testing is considered an integral part of a complete examination. Limited examinations for reoccurring indications may be performed as noted.  +---------+---------------+---------+-----------+----------+--------------+ RIGHT    CompressibilityPhasicitySpontaneityPropertiesSummary        +---------+---------------+---------+-----------+----------+--------------+ CFV      Full           Yes      Yes                                 +---------+---------------+---------+-----------+----------+--------------+ SFJ      Full                                                         +---------+---------------+---------+-----------+----------+--------------+ FV Prox  Full                                                        +---------+---------------+---------+-----------+----------+--------------+ FV Mid   Full                                                        +---------+---------------+---------+-----------+----------+--------------+  FV DistalFull                                                        +---------+---------------+---------+-----------+----------+--------------+ PFV      Full                                                        +---------+---------------+---------+-----------+----------+--------------+ POP      Full           Yes      Yes                                 +---------+---------------+---------+-----------+----------+--------------+ PTV                                                   Not visualized +---------+---------------+---------+-----------+----------+--------------+ PERO                                                  Not visualized +---------+---------------+---------+-----------+----------+--------------+   +---------+---------------+---------+-----------+----------+--------------+ LEFT     CompressibilityPhasicitySpontaneityPropertiesSummary        +---------+---------------+---------+-----------+----------+--------------+ CFV      Full           Yes      Yes                                 +---------+---------------+---------+-----------+----------+--------------+ SFJ      Full                                                        +---------+---------------+---------+-----------+----------+--------------+ FV Prox  Full                                                        +---------+---------------+---------+-----------+----------+--------------+ FV Mid   Full                                                         +---------+---------------+---------+-----------+----------+--------------+ FV DistalFull                                                        +---------+---------------+---------+-----------+----------+--------------+  PFV      Full                                                        +---------+---------------+---------+-----------+----------+--------------+ POP      Full           Yes      Yes                                 +---------+---------------+---------+-----------+----------+--------------+ PTV                                                   Not visualized +---------+---------------+---------+-----------+----------+--------------+ PERO                                                  Not visualized +---------+---------------+---------+-----------+----------+--------------+     Summary: Right: There is no evidence of deep vein thrombosis in the lower extremity. However, portions of this examination were limited- see technologist comments above. No cystic structure found in the popliteal fossa. Left: There is no evidence of deep vein thrombosis in the lower extremity. However, portions of this examination were limited- see technologist comments above. No cystic structure found in the popliteal fossa.  *See table(s) above for measurements and observations.    Preliminary         Scheduled Meds: . allopurinol  50 mg Oral Daily  . enoxaparin (LOVENOX) injection  40 mg Subcutaneous Q24H  . finasteride  5 mg Oral Daily  . FLUoxetine  10 mg Oral Daily  . furosemide  60 mg Intravenous BID  . nystatin  5 mL Mouth/Throat QID  . pantoprazole  40 mg Oral Daily  . potassium chloride SA  20 mEq Oral BID  . pramipexole  0.75 mg Oral QHS  . sodium chloride flush  3 mL Intravenous Q12H  . spironolactone  25 mg Oral Daily  . traZODone  50 mg Oral QHS   Continuous Infusions: . sodium chloride       LOS: 2 days      Georgette Shell, MD [facility]  If 7PM-7AM, please contact night-coverage [email or web address] Password [facility] 07/21/2018, 11:43 AM

## 2018-07-22 LAB — BASIC METABOLIC PANEL
Anion gap: 8 (ref 5–15)
BUN: 25 mg/dL — ABNORMAL HIGH (ref 8–23)
CO2: 35 mmol/L — ABNORMAL HIGH (ref 22–32)
Calcium: 8.2 mg/dL — ABNORMAL LOW (ref 8.9–10.3)
Chloride: 95 mmol/L — ABNORMAL LOW (ref 98–111)
Creatinine, Ser: 1.57 mg/dL — ABNORMAL HIGH (ref 0.61–1.24)
GFR calc Af Amer: 46 mL/min — ABNORMAL LOW (ref >60)
GFR calc non Af Amer: 40 mL/min — ABNORMAL LOW (ref >60)
Glucose, Bld: 115 mg/dL — ABNORMAL HIGH (ref 70–99)
Potassium: 4.2 mmol/L (ref 3.5–5.1)
Sodium: 138 mmol/L (ref 135–145)

## 2018-07-22 NOTE — Progress Notes (Signed)
Foley removed. Patient said it felt kinked, RN checked and the catheter was half way out.

## 2018-07-22 NOTE — Progress Notes (Signed)
Progress Note  Patient Name: Ryan [name] Date of Encounter: 07/22/2018  Primary Cardiologist: [name], MD   Subjective   "My swelling is better."  Inpatient Medications    Scheduled Meds:  allopurinol  50 mg Oral Daily   enoxaparin (LOVENOX) injection  40 mg Subcutaneous Q24H   finasteride  5 mg Oral Daily   FLUoxetine  10 mg Oral Daily   fluticasone  1 spray Each Nare Daily   furosemide  60 mg Intravenous BID   nystatin  5 mL Mouth/Throat QID   pantoprazole  40 mg Oral Daily   potassium chloride SA  20 mEq Oral BID   pramipexole  0.75 mg Oral QHS   sodium chloride flush  3 mL Intravenous Q12H   spironolactone  25 mg Oral Daily   traZODone  50 mg Oral QHS   Continuous Infusions:  sodium chloride     PRN Meds: sodium chloride, acetaminophen, ondansetron (ZOFRAN) IV, sodium chloride flush, traMADol, triazolam   Vital Signs    Vitals:   07/21/18 1641 07/21/18 2030 07/22/18 0500 07/22/18 0921  BP: 120/78 105/68 105/63 99/75  Pulse: 74 74 76 83  Resp: _0 Temp:  97.6 F (36.4 C) 97.8 F (36.6 C)   TempSrc:  Oral Oral   SpO2: 94% 96% 96% 94%  Weight:   96.1 kg   Height:        Intake/Output Summary (Last 24 hours) at 07/22/2018 1329 Last data filed at 07/22/2018 0900 Gross per 24 hour  Intake 490 ml  Output 1950 ml  Net -1460 ml   Filed Weights   07/20/18 0328 07/21/18 0543 07/22/18 0500  Weight: 96.8 kg 97.2 kg 96.1 kg    Telemetry    nsr  - Personally Reviewed  ECG    none - Personally Reviewed  Physical Exam   GEN: No acute distress.   Neck: 7 cm JVD Cardiac: RRR Respiratory: Clear to auscultation bilaterally. GI: Soft, nontender, non-distended  MS: 2+ edema; No deformity. Neuro:  Nonfocal  Psych: Normal affect   Labs    Chemistry Recent Labs  Lab 07/20/18 0408 07/21/18 0607 07/22/18 0436  NA 137 136 138  K 4.4 4.3 4.2  CL 91* 95* 95*  CO2 35* 30 35*  GLUCOSE 141* 125* 115*  BUN 25* 27* 25*    CREATININE 1.41* 1.54* 1.57*  CALCIUM 8.7* 8.4* 8.2*  PROT  --  6.1*  --   ALBUMIN  --  3.3*  --   AST  --  25  --   ALT  --  27  --   ALKPHOS  --  83  --   BILITOT  --  0.7  --   GFRNONAA 45* 41* 40*  GFRAA 53* 47* 46*  ANIONGAP _1 Hematology Recent Labs  Lab 07/19/18 1452 07/20/18 0408 07/21/18 0607  WBC 8.9 8.2 7.9  RBC 5.21 5.36 5.23  HGB 15.0 15.5 15.2  HCT 49.5 50.9 49.3  MCV 95.0 95.0 94.3  MCH 28.8 28.9 29.1  MCHC 30.3 30.5 30.8  RDW 14.9 15.2 15.1  PLT 231 258 220    Cardiac Enzymes Recent Labs  Lab 07/19/18 1452  TROPONINI <0.03   No results for input(s): TROPIPOC in the last 168 hours.   BNP Recent Labs  Lab 07/19/18 1750  BNP 36.1     DDimer No results for input(s): DDIMER in the last 168 hours.   Radiology  Vas [place] Lower Extremity Venous (dvt)  Result Date: 07/21/2018  Lower Venous Study Indications: Edema.  Limitations: Body habitus and poor ultrasound/tissue interface. Performing Technologist: Abram Sander RVS  Examination Guidelines: A complete evaluation includes B-mode imaging, spectral Doppler, color Doppler, and power Doppler as needed of all accessible portions of each vessel. Bilateral testing is considered an integral part of a complete examination. Limited examinations for reoccurring indications may be performed as noted.  +---------+---------------+---------+-----------+----------+--------------+  RIGHT     Compressibility Phasicity Spontaneity Properties Summary         +---------+---------------+---------+-----------+----------+--------------+  CFV       Full            Yes       Yes                                    +---------+---------------+---------+-----------+----------+--------------+  SFJ       Full                                                             +---------+---------------+---------+-----------+----------+--------------+  FV Prox   Full                                                              +---------+---------------+---------+-----------+----------+--------------+  FV Mid    Full                                                             +---------+---------------+---------+-----------+----------+--------------+  FV Distal Full                                                             +---------+---------------+---------+-----------+----------+--------------+  PFV       Full                                                             +---------+---------------+---------+-----------+----------+--------------+  POP       Full            Yes       Yes                                    +---------+---------------+---------+-----------+----------+--------------+  PTV  Not visualized  +---------+---------------+---------+-----------+----------+--------------+  PERO                                                       Not visualized  +---------+---------------+---------+-----------+----------+--------------+   +---------+---------------+---------+-----------+----------+--------------+  LEFT      Compressibility Phasicity Spontaneity Properties Summary         +---------+---------------+---------+-----------+----------+--------------+  CFV       Full            Yes       Yes                                    +---------+---------------+---------+-----------+----------+--------------+  SFJ       Full                                                             +---------+---------------+---------+-----------+----------+--------------+  FV Prox   Full                                                             +---------+---------------+---------+-----------+----------+--------------+  FV Mid    Full                                                             +---------+---------------+---------+-----------+----------+--------------+  FV Distal Full                                                              +---------+---------------+---------+-----------+----------+--------------+  PFV       Full                                                             +---------+---------------+---------+-----------+----------+--------------+  POP       Full            Yes       Yes                                    +---------+---------------+---------+-----------+----------+--------------+  PTV  Not visualized  +---------+---------------+---------+-----------+----------+--------------+  PERO                                                       Not visualized  +---------+---------------+---------+-----------+----------+--------------+     Summary: Right: There is no evidence of deep vein thrombosis in the lower extremity. However, portions of this examination were limited- see technologist comments above. No cystic structure found in the popliteal fossa. Left: There is no evidence of deep vein thrombosis in the lower extremity. However, portions of this examination were limited- see technologist comments above. No cystic structure found in the popliteal fossa.  *See table(s) above for measurements and observations. Electronically signed by Monica Martinez MD on 07/21/2018 at 4:50:07 PM.    Final     Cardiac Studies   2D echo - normal LV function  Patient Profile     85 y.o. male admitted with peripheral edema and sob.  Assessment & Plan    1. Acute on chronic diastolic heart failure- his weight is down but not yet back to baseline. He will continue iv diuresis 2. Chronic renal insufficiency - his creatinine is stable, class 2 CKD      For questions or updates, please contact [place] Please consult [email or web address] for contact info under Cardiology/STEMI.      Signed, Cristopher Peru, MD  07/22/2018, 1:29 PM  Patient ID: [name], male   DOB: 1931/04/07, 85 y.o.   MRN: [id number]

## 2018-07-22 NOTE — Progress Notes (Signed)
PROGRESS NOTE    SIDDH VANDEVENTER  KYH:[id number] DOB: 14-Nov-1931 DOA: 07/19/2018 PCP: Chesley Noon, MD   Brief Narrative: 85 y.o.malewith medical history significant ofdiastolic CHF, chronic lower extremity edema with venous insufficiency, Barrett's esophagus, BPH, gluten intolerance, hypertension, history of lymphoma, hyperlipidemia among other things who presents to the ER with worsening lower extremity edema including weeping of the feet. He has been on diuretics.He is on torsemide and has been compliant.Patient however appears to have anasarca at the moment. He has exertional dyspnea. Denied any cough. Denied any chest pain. Patient was seen and evaluated and found to have worsening edema. ER has contacted his cardiologist at [facility] who recommended IV diuretics and return to Lasix instead of torsemide at discharge. He is being admitted for treatment..  ED Course:Temperature is 99 blood pressure is 113/67 pulse 92 respiratory 28 oxygen sat 97% room air. CBC appears to be entirely within normal. Chemistry showed a chloride 93 otherwise glucose 133 BUN 29 creatinine 1.51. Calcium is 8.8. Troponin less than 0.03 BNP of 36. Coronavirus COVID-19 screen is negative. Chest x-ray showed large hiatal hernia with mild atelectasis. No obvious fluids. Patient has received 80 mg of Lasix in the ER Foley catheter inserted and he is being admitted for treatment.   Assessment & Plan:   Principal Problem:   CHF exacerbation ([place]) Active Problems:   Hyperlipidemia   BPH (benign prostatic hyperplasia)   Chronic venous insufficiency   PEDAL EDEMA   Barrett's esophagus   Non Hodgkin's lymphoma (HCC)   Essential hypertension   Chronic diastolic heart failure (HCC)   COPD (HCC)   Bilateral lower extremity edema   Acute on chronic diastolic heart failure ([place])  #1 anasarca unclear etiology patient reports he was taking all his medications as prescribed and was monitoring his diet  except during COVID he had to get fluid food from outside.  Followed at [facility] by [facility] cardiologist Dr. Christa See.  He has been on IV Lasix since admission.  He is negative by 5.8 L since admission.Marland Kitchen  He does have a Foley catheter in place.  His creatinine also trended up.  Cardiology has consulted heart failure team.  Echocardiogram this admission shows normal ejection fraction with grade 1 diastolic dysfunction.  His LFTs have been normal albumin is 3.3.Marland Kitchen -Patient continues with severe dyspnea on exertion and paroxysmal nocturnal dyspnea with fluid overload. He needs to continue IV diuresis. -Doppler of the lower extremities - no dvt - still volume overloaded needs iv diuresis  #2 CKD stage III creatinine monitor on IV diuresis creatinine trending up.  #3 chronic venous insufficiency with bilateral lower extremity edema worse than baseline. They are warm to touch monitor closely.  Have not started any antibiotics at this time.  #4 history of pulmonary fibrosis O2 dependent 2 L 24/7   #5 hypertension stable on meds  #6 GERD with Barrett's esophagus continue PPI  #7 hyperlipidemia on statins  #8 bilateral lower extremity edema erythema-slightly better than yesterday with ongoing diuresis.     DVT prophylaxis:Lovenox Code Status:Full code Family Communication:None patient did not wanted me to speak to anyone else he lives alone in an assisted living facility. Disposition Plan:Pending clinical improvement  consults called:Cardiology   Estimated body mass index is 33.17 kg/m as calculated from the following:   Height as of this encounter: _0  (1.702 m).   Weight as of this encounter: 96.1 kg.   Subjective: Feels better with fluid coming off Objective: Vitals:   07/21/18 1641  07/21/18 2030 07/22/18 0500 07/22/18 0921  BP: 120/78 105/68 105/63 99/75  Pulse: 74 74 76 83  Resp: _0 Temp:  97.6 F (36.4 C) 97.8 F (36.6 C)   TempSrc:  Oral Oral   SpO2: 94%  96% 96% 94%  Weight:   96.1 kg   Height:        Intake/Output Summary (Last 24 hours) at 07/22/2018 1234 Last data filed at 07/22/2018 0900 Gross per 24 hour  Intake 712 ml  Output 2925 ml  Net -2213 ml   Filed Weights   07/20/18 0328 07/21/18 0543 07/22/18 0500  Weight: 96.8 kg 97.2 kg 96.1 kg    Examination:  General exam: Appears calm and comfortable  Respiratory system: diminished breath sounds  to auscultation. Respiratory effort normal. Cardiovascular system: S1 & S2 heard, RRR. No JVD, murmurs, rubs, gallops or clicks. No pedal edema. Gastrointestinal system: Abdomen is nondistended, soft and nontender. No organomegaly or masses felt. Normal bowel sounds heard. Central nervous system: Alert and oriented. No focal neurological deficits. Extremities: decreasing edema Skin: No rashes, lesions or ulcers Psychiatry: Judgement and insight appear normal. Mood & affect appropriate.     Data Reviewed: I have personally reviewed following labs and imaging studies  CBC: Recent Labs  Lab 07/19/18 1452 07/20/18 0408 07/21/18 0607  WBC 8.9 8.2 7.9  NEUTROABS  --  6.0 5.6  HGB 15.0 15.5 15.2  HCT 49.5 50.9 49.3  MCV 95.0 95.0 94.3  PLT 231 258 579   Basic Metabolic Panel: Recent Labs  Lab 07/19/18 1452 07/20/18 0408 07/21/18 0607 07/22/18 0436  NA 137 137 136 138  K 4.7 4.4 4.3 4.2  CL 93* 91* 95* 95*  CO2 32 35* 30 35*  GLUCOSE 133* 141* 125* 115*  BUN 29* 25* 27* 25*  CREATININE 1.51* 1.41* 1.54* 1.57*  CALCIUM 8.8* 8.7* 8.4* 8.2*   GFR: Estimated Creatinine Clearance: 38.7 mL/min (A) (by C-G formula based on SCr of 1.57 mg/dL (H)). Liver Function Tests: Recent Labs  Lab 07/21/18 0607  AST 25  ALT 27  ALKPHOS 83  BILITOT 0.7  PROT 6.1*  ALBUMIN 3.3*   No results for input(s): LIPASE, AMYLASE in the last 168 hours. No results for input(s): AMMONIA in the last 168 hours. Coagulation Profile: No results for input(s): INR, PROTIME in the last 168  hours. Cardiac Enzymes: Recent Labs  Lab 07/19/18 1452  TROPONINI <0.03   BNP (last 3 results) No results for input(s): PROBNP in the last 8760 hours. HbA1C: No results for input(s): HGBA1C in the last 72 hours. CBG: No results for input(s): GLUCAP in the last 168 hours. Lipid Profile: No results for input(s): CHOL, HDL, LDLCALC, TRIG, CHOLHDL, LDLDIRECT in the last 72 hours. Thyroid Function Tests: No results for input(s): TSH, T4TOTAL, FREET4, T3FREE, THYROIDAB in the last 72 hours. Anemia Panel: No results for input(s): VITAMINB12, FOLATE, FERRITIN, TIBC, IRON, RETICCTPCT in the last 72 hours. Sepsis Labs: No results for input(s): PROCALCITON, LATICACIDVEN in the last 168 hours.  Recent Results (from the past 240 hour(s))  Novel Coronavirus,NAA,(SEND-OUT TO REF LAB - TAT 24-48 hrs); Hosp Order     Status: None   Collection Time: 07/19/18  5:50 PM   Specimen: Nasopharyngeal Swab; Respiratory  Result Value Ref Range Status   SARS-CoV-2, NAA NOT DETECTED NOT DETECTED Final    Comment: (NOTE) This test was developed and its performance characteristics determined by [employer]. This test has not been FDA cleared  or approved. This test has been authorized by FDA under an Emergency Use Authorization (EUA). This test is only authorized for the duration of time the declaration that circumstances exist justifying the authorization of the emergency use of in vitro diagnostic tests for detection of SARS-CoV-2 virus and/or diagnosis of COVID-19 infection under section 564(b)(1) of the Act, 21 U.S.C. 161WRU-0(A)(5), unless the authorization is terminated or revoked sooner. When diagnostic testing is negative, the possibility of a false negative result should be considered in the context of a patient's recent exposures and the presence of clinical signs and symptoms consistent with COVID-19. An individual without symptoms of COVID-19 and who is not shedding SARS-CoV-2 virus  would expect to have a negative (not detected) result in this assay. Performed  At: [facility] [address] [place], [place] [id number] Rush Farmer MD NW:[id number]    [place]  Final    Comment: Performed at [facility], [place] [address] [place], [place] [address]         Radiology Studies: Vas [place] Lower Extremity Venous (dvt)  Result Date: 07/21/2018  Lower Venous Study Indications: Edema.  Limitations: Body habitus and poor ultrasound/tissue interface. Performing Technologist: Abram Sander RVS  Examination Guidelines: A complete evaluation includes B-mode imaging, spectral Doppler, color Doppler, and power Doppler as needed of all accessible portions of each vessel. Bilateral testing is considered an integral part of a complete examination. Limited examinations for reoccurring indications may be performed as noted.  +---------+---------------+---------+-----------+----------+--------------+  RIGHT     Compressibility Phasicity Spontaneity Properties Summary         +---------+---------------+---------+-----------+----------+--------------+  CFV       Full            Yes       Yes                                    +---------+---------------+---------+-----------+----------+--------------+  SFJ       Full                                                             +---------+---------------+---------+-----------+----------+--------------+  FV Prox   Full                                                             +---------+---------------+---------+-----------+----------+--------------+  FV Mid    Full                                                             +---------+---------------+---------+-----------+----------+--------------+  FV Distal Full                                                             +---------+---------------+---------+-----------+----------+--------------+  PFV       Full                                                              +---------+---------------+---------+-----------+----------+--------------+  POP       Full            Yes       Yes                                    +---------+---------------+---------+-----------+----------+--------------+  PTV                                                        Not visualized  +---------+---------------+---------+-----------+----------+--------------+  PERO                                                       Not visualized  +---------+---------------+---------+-----------+----------+--------------+   +---------+---------------+---------+-----------+----------+--------------+  LEFT      Compressibility Phasicity Spontaneity Properties Summary         +---------+---------------+---------+-----------+----------+--------------+  CFV       Full            Yes       Yes                                    +---------+---------------+---------+-----------+----------+--------------+  SFJ       Full                                                             +---------+---------------+---------+-----------+----------+--------------+  FV Prox   Full                                                             +---------+---------------+---------+-----------+----------+--------------+  FV Mid    Full                                                             +---------+---------------+---------+-----------+----------+--------------+  FV Distal Full                                                             +---------+---------------+---------+-----------+----------+--------------+  PFV       Full                                                             +---------+---------------+---------+-----------+----------+--------------+  POP       Full            Yes       Yes                                    +---------+---------------+---------+-----------+----------+--------------+  PTV                                                        Not visualized   +---------+---------------+---------+-----------+----------+--------------+  PERO                                                       Not visualized  +---------+---------------+---------+-----------+----------+--------------+     Summary: Right: There is no evidence of deep vein thrombosis in the lower extremity. However, portions of this examination were limited- see technologist comments above. No cystic structure found in the popliteal fossa. Left: There is no evidence of deep vein thrombosis in the lower extremity. However, portions of this examination were limited- see technologist comments above. No cystic structure found in the popliteal fossa.  *See table(s) above for measurements and observations. Electronically signed by Monica Martinez MD on 07/21/2018 at 4:50:07 PM.    Final         Scheduled Meds:  allopurinol  50 mg Oral Daily   enoxaparin (LOVENOX) injection  40 mg Subcutaneous Q24H   finasteride  5 mg Oral Daily   FLUoxetine  10 mg Oral Daily   fluticasone  1 spray Each Nare Daily   furosemide  60 mg Intravenous BID   nystatin  5 mL Mouth/Throat QID   pantoprazole  40 mg Oral Daily   potassium chloride SA  20 mEq Oral BID   pramipexole  0.75 mg Oral QHS   sodium chloride flush  3 mL Intravenous Q12H   spironolactone  25 mg Oral Daily   traZODone  50 mg Oral QHS   Continuous Infusions:  sodium chloride       LOS: 3 days     Georgette Shell, MD [facility]   If 7PM-7AM, please contact night-coverage [email or web address] Password [facility] 07/22/2018, 12:34 PM

## 2018-07-23 LAB — BASIC METABOLIC PANEL
Anion gap: 9 (ref 5–15)
BUN: 25 mg/dL — ABNORMAL HIGH (ref 8–23)
CO2: 31 mmol/L (ref 22–32)
Calcium: 8.3 mg/dL — ABNORMAL LOW (ref 8.9–10.3)
Chloride: 95 mmol/L — ABNORMAL LOW (ref 98–111)
Creatinine, Ser: 1.44 mg/dL — ABNORMAL HIGH (ref 0.61–1.24)
GFR calc Af Amer: 51 mL/min — ABNORMAL LOW (ref >60)
GFR calc non Af Amer: 44 mL/min — ABNORMAL LOW (ref >60)
Glucose, Bld: 151 mg/dL — ABNORMAL HIGH (ref 70–99)
Potassium: 4.7 mmol/L (ref 3.5–5.1)
Sodium: 135 mmol/L (ref 135–145)

## 2018-07-23 MED ORDER — FUROSEMIDE 10 MG/ML IJ SOLN
80.0000 mg | Freq: Two times a day (BID) | INTRAMUSCULAR | Status: DC
  Administered 2018-07-23 – 2018-07-24 (×3): 80 mg via INTRAVENOUS
  Filled 2018-07-23 (×4): qty 8

## 2018-07-23 MED ORDER — MAGNESIUM HYDROXIDE 400 MG/5ML PO SUSP
5.0000 mL | Freq: Every day | ORAL | Status: DC | PRN
  Administered 2018-07-23: 5 mL via ORAL
  Filled 2018-07-23: qty 30

## 2018-07-23 NOTE — Progress Notes (Signed)
PROGRESS NOTE    Ryan [name]  MHD:[id number] DOB: 06-29-1931 DOA: 07/19/2018 PCP: Chesley Noon, MD   Brief Narrative: 85 y.o.malewith medical history significant ofdiastolic CHF, chronic lower extremity edema with venous insufficiency, Barrett's esophagus, BPH, gluten intolerance, hypertension, history of lymphoma, hyperlipidemia among other things who presents to the ER with worsening lower extremity edema including weeping of the feet. He has been on diuretics.He is on torsemide and has been compliant.Patient however appears to have anasarca at the moment. He has exertional dyspnea. Denied any cough. Denied any chest pain. Patient was seen and evaluated and found to have worsening edema. ER has contacted his cardiologist at [facility] who recommended IV diuretics and return to Lasix instead of torsemide at discharge. He is being admitted for treatment..  ED Course:Temperature is 99 blood pressure is 113/67 pulse 92 respiratory 28 oxygen sat 97% room air. CBC appears to be entirely within normal. Chemistry showed a chloride 93 otherwise glucose 133 BUN 29 creatinine 1.51. Calcium is 8.8. Troponin less than 0.03 BNP of 36. Coronavirus COVID-19 screen is negative. Chest x-ray showed large hiatal hernia with mild atelectasis. No obvious fluids. Patient has received 80 mg of Lasix in the ER Foley catheter inserted and he is being admitted for treatment.   Assessment & Plan:   Principal Problem:   CHF exacerbation ([place]) Active Problems:   Hyperlipidemia   BPH (benign prostatic hyperplasia)   Chronic venous insufficiency   PEDAL EDEMA   Barrett's esophagus   Non Hodgkin's lymphoma (HCC)   Essential hypertension   Chronic diastolic heart failure (HCC)   COPD (HCC)   Bilateral lower extremity edema   Acute on chronic diastolic heart failure ([place])   #1 anasarcaunclear etiology patient reports he was taking all his medications as prescribed and was monitoring his diet  except during COVID he had to get fluid food from outside. Followed at [facility] by [facility] cardiologist Dr. Christa See. He has been on IV Lasix since admission. He is negative by 7 L since admission. His creatinine is trending down with further diuresis. Echocardiogram this admission shows normal ejection fraction with grade 1 diastolic dysfunction.His LFTs have been normal albumin is3.3.Marland Kitchen -Patient continues with severe dyspnea on exertion and paroxysmal nocturnal dyspnea with fluid overload. He needs to continue IV diuresis. -Doppler of the lower extremities- no dvt - still volume overloaded needs iv diuresis  #2 CKD stage III creatininemonitor on IV diuresis creatinine trending down  #3 chronic venous insufficiency with bilateral lower extremity edema worse than baseline. They are warm to touch monitor closely.Have not started any antibiotics at this time.  #4 history of pulmonary fibrosis O2 dependent 2 L 24/7   #5 hypertension stable on meds  #6 GERD with Barrett's esophagus continue PPI  #7 hyperlipidemia on statins  #8 bilateral lower extremity edema erythema-slightly better than yesterday with ongoing diuresis.  DVT prophylaxis:Lovenox Code Status:Full code Family Communication:None patient did not wanted me to speak to anyone else he lives alone in an assisted living facility. Disposition Plan:Pending clinical improvement  consults called:Cardiology    Estimated body mass index is 32.97 kg/m as calculated from the following:   Height as of this encounter: _0  (1.702 m).   Weight as of this encounter: 95.5 kg.   Subjective:  Sitting up in chair decrease swelling in lower extremities Foley came out yesterday able to pee now. Objective: Vitals:   07/22/18 1354 07/22/18 1626 07/22/18 1929 07/23/18 0358  BP: 116/73 110/68 123/78 130/82  Pulse: 82  81 79 73  Resp: _0 Temp: 98.7 F (37.1 C) 98.5 F (36.9 C) 98.3 F (36.8 C) 98.1 F (36.7  C)  TempSrc: Oral Oral Oral Oral  SpO2: 99% 96% 98% 96%  Weight:    95.5 kg  Height:        Intake/Output Summary (Last 24 hours) at 07/23/2018 1224 Last data filed at 07/23/2018 0954 Gross per 24 hour  Intake 1163 ml  Output 1975 ml  Net -812 ml   Filed Weights   07/21/18 0543 07/22/18 0500 07/23/18 0358  Weight: 97.2 kg 96.1 kg 95.5 kg    Examination:  General exam: Appears calm and comfortable  Respiratory system: Diminished breath sounds at the bases to auscultation. Respiratory effort normal. Cardiovascular system: S1 & S2 heard, RRR. No JVD, murmurs, rubs, gallops or clicks. No pedal edema. Gastrointestinal system: Abdomen is nondistended, soft and nontender. No organomegaly or masses felt. Normal bowel sounds heard. Central nervous system: Alert and oriented. No focal neurological deficits. Extremities: Decreasing edema 2+  skin: No rashes, lesions or ulcers Psychiatry: Judgement and insight appear normal. Mood & affect appropriate.     Data Reviewed: I have personally reviewed following labs and imaging studies  CBC: Recent Labs  Lab 07/19/18 1452 07/20/18 0408 07/21/18 0607  WBC 8.9 8.2 7.9  NEUTROABS  --  6.0 5.6  HGB 15.0 15.5 15.2  HCT 49.5 50.9 49.3  MCV 95.0 95.0 94.3  PLT 231 258 628   Basic Metabolic Panel: Recent Labs  Lab 07/19/18 1452 07/20/18 0408 07/21/18 0607 07/22/18 0436 07/23/18 0845  NA 137 137 136 138 135  K 4.7 4.4 4.3 4.2 4.7  CL 93* 91* 95* 95* 95*  CO2 32 35* 30 35* 31  GLUCOSE 133* 141* 125* 115* 151*  BUN 29* 25* 27* 25* 25*  CREATININE 1.51* 1.41* 1.54* 1.57* 1.44*  CALCIUM 8.8* 8.7* 8.4* 8.2* 8.3*   GFR: Estimated Creatinine Clearance: 42.1 mL/min (A) (by C-G formula based on SCr of 1.44 mg/dL (H)). Liver Function Tests: Recent Labs  Lab 07/21/18 0607  AST 25  ALT 27  ALKPHOS 83  BILITOT 0.7  PROT 6.1*  ALBUMIN 3.3*   No results for input(s): LIPASE, AMYLASE in the last 168 hours. No results for input(s):  AMMONIA in the last 168 hours. Coagulation Profile: No results for input(s): INR, PROTIME in the last 168 hours. Cardiac Enzymes: Recent Labs  Lab 07/19/18 1452  TROPONINI <0.03   BNP (last 3 results) No results for input(s): PROBNP in the last 8760 hours. HbA1C: No results for input(s): HGBA1C in the last 72 hours. CBG: No results for input(s): GLUCAP in the last 168 hours. Lipid Profile: No results for input(s): CHOL, HDL, LDLCALC, TRIG, CHOLHDL, LDLDIRECT in the last 72 hours. Thyroid Function Tests: No results for input(s): TSH, T4TOTAL, FREET4, T3FREE, THYROIDAB in the last 72 hours. Anemia Panel: No results for input(s): VITAMINB12, FOLATE, FERRITIN, TIBC, IRON, RETICCTPCT in the last 72 hours. Sepsis Labs: No results for input(s): PROCALCITON, LATICACIDVEN in the last 168 hours.  Recent Results (from the past 240 hour(s))  Novel Coronavirus,NAA,(SEND-OUT TO REF LAB - TAT 24-48 hrs); Hosp Order     Status: None   Collection Time: 07/19/18  5:50 PM   Specimen: Nasopharyngeal Swab; Respiratory  Result Value Ref Range Status   SARS-CoV-2, NAA NOT DETECTED NOT DETECTED Final    Comment: (NOTE) This test was developed and its performance characteristics determined by [employer]. This test  has not been FDA cleared or approved. This test has been authorized by FDA under an Emergency Use Authorization (EUA). This test is only authorized for the duration of time the declaration that circumstances exist justifying the authorization of the emergency use of in vitro diagnostic tests for detection of SARS-CoV-2 virus and/or diagnosis of COVID-19 infection under section 564(b)(1) of the Act, 21 U.S.C. 023XID-5(W)(8), unless the authorization is terminated or revoked sooner. When diagnostic testing is negative, the possibility of a false negative result should be considered in the context of a patient's recent exposures and the presence of clinical signs and symptoms  consistent with COVID-19. An individual without symptoms of COVID-19 and who is not shedding SARS-CoV-2 virus would expect to have a negative (not detected) result in this assay. Performed  At: [facility] [address] [place], [place] [id number] Rush Farmer MD SX:[id number]    [place]  Final    Comment: Performed at [facility], [place] [address], [place], [place] [address]         Radiology Studies: No results found.      Scheduled Meds: . allopurinol  50 mg Oral Daily  . enoxaparin (LOVENOX) injection  40 mg Subcutaneous Q24H  . finasteride  5 mg Oral Daily  . FLUoxetine  10 mg Oral Daily  . fluticasone  1 spray Each Nare Daily  . furosemide  80 mg Intravenous BID  . nystatin  5 mL Mouth/Throat QID  . pantoprazole  40 mg Oral Daily  . potassium chloride SA  20 mEq Oral BID  . pramipexole  0.75 mg Oral QHS  . sodium chloride flush  3 mL Intravenous Q12H  . spironolactone  25 mg Oral Daily  . traZODone  50 mg Oral QHS   Continuous Infusions: . sodium chloride       LOS: 4 days     Georgette Shell, MD [facility]  If 7PM-7AM, please contact night-coverage [email or web address] Password [facility] 07/23/2018, 12:24 PM

## 2018-07-23 NOTE — Progress Notes (Signed)
Progress Note  Patient Name: Ryan [name] Date of Encounter: 07/23/2018  Primary Cardiologist: [name], MD   Subjective   No chest pain or sob. Feels like he is back to baseline almost.   Inpatient Medications    Scheduled Meds: . allopurinol  50 mg Oral Daily  . enoxaparin (LOVENOX) injection  40 mg Subcutaneous Q24H  . finasteride  5 mg Oral Daily  . FLUoxetine  10 mg Oral Daily  . fluticasone  1 spray Each Nare Daily  . furosemide  60 mg Intravenous BID  . nystatin  5 mL Mouth/Throat QID  . pantoprazole  40 mg Oral Daily  . potassium chloride SA  20 mEq Oral BID  . pramipexole  0.75 mg Oral QHS  . sodium chloride flush  3 mL Intravenous Q12H  . spironolactone  25 mg Oral Daily  . traZODone  50 mg Oral QHS   Continuous Infusions: . sodium chloride     PRN Meds: sodium chloride, acetaminophen, ondansetron (ZOFRAN) IV, sodium chloride flush, traMADol, triazolam   Vital Signs    Vitals:   07/22/18 1354 07/22/18 1626 07/22/18 1929 07/23/18 0358  BP: 116/73 110/68 123/78 130/82  Pulse: 82 81 79 73  Resp: _0 Temp: 98.7 F (37.1 C) 98.5 F (36.9 C) 98.3 F (36.8 C) 98.1 F (36.7 C)  TempSrc: Oral Oral Oral Oral  SpO2: 99% 96% 98% 96%  Weight:    95.5 kg  Height:        Intake/Output Summary (Last 24 hours) at 07/23/2018 1118 Last data filed at 07/23/2018 0954 Gross per 24 hour  Intake 1163 ml  Output 1975 ml  Net -812 ml   Filed Weights   07/21/18 0543 07/22/18 0500 07/23/18 0358  Weight: 97.2 kg 96.1 kg 95.5 kg    Telemetry    nsr - Personally Reviewed  ECG    none - Personally Reviewed  Physical Exam   GEN: No acute distress.   Neck: No JVD Cardiac: RRR Respiratory: No increased work of breathing GI: Soft, nontender, non-distended  MS: No edema; No deformity. Neuro:  Nonfocal  Psych: Normal affect   Labs    Chemistry Recent Labs  Lab 07/21/18 0607 07/22/18 0436 07/23/18 0845  NA 136 138 135  K 4.3 4.2 4.7   CL 95* 95* 95*  CO2 30 35* 31  GLUCOSE 125* 115* 151*  BUN 27* 25* 25*  CREATININE 1.54* 1.57* 1.44*  CALCIUM 8.4* 8.2* 8.3*  PROT 6.1*  --   --   ALBUMIN 3.3*  --   --   AST 25  --   --   ALT 27  --   --   ALKPHOS 83  --   --   BILITOT 0.7  --   --   GFRNONAA 41* 40* 44*  GFRAA 47* 46* 51*  ANIONGAP _1 Hematology Recent Labs  Lab 07/19/18 1452 07/20/18 0408 07/21/18 0607  WBC 8.9 8.2 7.9  RBC 5.21 5.36 5.23  HGB 15.0 15.5 15.2  HCT 49.5 50.9 49.3  MCV 95.0 95.0 94.3  MCH 28.8 28.9 29.1  MCHC 30.3 30.5 30.8  RDW 14.9 15.2 15.1  PLT 231 258 220    Cardiac Enzymes Recent Labs  Lab 07/19/18 1452  TROPONINI <0.03   No results for input(s): TROPIPOC in the last 168 hours.   BNP Recent Labs  Lab 07/19/18 1750  BNP 36.1  DDimer No results for input(s): DDIMER in the last 168 hours.   Radiology    No results found.  Cardiac Studies   2D echo with preserved LV function and moderate pulmonary HTN  Patient Profile     85 y.o. male admitted with worsening fluid overload in the setting of pulmonary HTN.  Assessment & Plan    1. Acute on chronic diastolic heart failure - his weight is down 2 more lbs, and 9 total from admit. He will be diuresed until his creatinine bumps.  2. Chronic stage 2 renal insufficiency - his creatinine is stable. We will follow.     For questions or updates, please contact [place] Please consult [email or web address] for contact info under Cardiology/STEMI.   Signed, Cristopher Peru, MD  07/23/2018, 11:18 AM  Patient ID: Ryan [name], male   DOB: Jul 21, 1931, 85 y.o.   MRN: [id number]

## 2018-07-23 NOTE — Plan of Care (Signed)
  Problem: Education: Goal: Knowledge of General Education information will improve Description: Including pain rating scale, medication(s)/side effects and non-pharmacologic comfort measures Outcome: Progressing   Problem: Health Behavior/Discharge Planning: Goal: Ability to manage health-related needs will improve Outcome: Progressing   Problem: Clinical Measurements: Goal: Respiratory complications will improve Outcome: Progressing

## 2018-07-24 DIAGNOSIS — I272 Pulmonary hypertension, unspecified: Secondary | ICD-10-CM

## 2018-07-24 LAB — BASIC METABOLIC PANEL
Anion gap: 8 (ref 5–15)
BUN: 25 mg/dL — ABNORMAL HIGH (ref 8–23)
CO2: 33 mmol/L — ABNORMAL HIGH (ref 22–32)
Calcium: 8.2 mg/dL — ABNORMAL LOW (ref 8.9–10.3)
Chloride: 96 mmol/L — ABNORMAL LOW (ref 98–111)
Creatinine, Ser: 1.49 mg/dL — ABNORMAL HIGH (ref 0.61–1.24)
GFR calc Af Amer: 49 mL/min — ABNORMAL LOW (ref >60)
GFR calc non Af Amer: 42 mL/min — ABNORMAL LOW (ref >60)
Glucose, Bld: 114 mg/dL — ABNORMAL HIGH (ref 70–99)
Potassium: 4.2 mmol/L (ref 3.5–5.1)
Sodium: 137 mmol/L (ref 135–145)

## 2018-07-24 MED ORDER — SODIUM CHLORIDE 0.9% FLUSH: 3.0000 mL | INTRAVENOUS | Status: DC | PRN

## 2018-07-24 MED ORDER — SODIUM CHLORIDE 0.9 % IV SOLN: 250.0000 mL | INTRAVENOUS | Status: DC | PRN

## 2018-07-24 MED ORDER — METOLAZONE 2.5 MG PO TABS
2.5000 mg | ORAL_TABLET | Freq: Once | ORAL | Status: AC
  Administered 2018-07-24: 2.5 mg via ORAL
  Filled 2018-07-24: qty 1

## 2018-07-24 MED ORDER — SODIUM CHLORIDE 0.9% FLUSH
3.0000 mL | Freq: Two times a day (BID) | INTRAVENOUS | Status: DC
  Administered 2018-07-24 – 2018-07-26 (×4): 3 mL via INTRAVENOUS

## 2018-07-24 MED ORDER — SODIUM CHLORIDE 0.9 % IV SOLN
INTRAVENOUS | Status: DC
  Administered 2018-07-25: 05:00:00 via INTRAVENOUS

## 2018-07-24 NOTE — Consult Note (Signed)
Advanced Heart Failure Team Consult Note   Primary Physician: Chesley Noon, MD PCP-Cardiologist:  [name], MD  Reason for Consultation: CHF  HPI:    Ryan [name] is seen today for evaluation of CHF/pulmonary hypertension at the request of Dr. Radford Pax.   85 yo with history of interstitial lung disease (?NSIP vs UIP) on home oxygen, pulmonary hypertension, diastolic CHF, and prior non-Hodgkins lymphoma (pelvic mass, s/p XRT) was admitted for CHF exacerbation.  Patient has history of reportedly mild ILD (NSIP vs UIP) followed by a pulmonologist at [facility] and pulmonary hypertension thought to be out of proportion lung disease followed by a physician at [facility].  Last CT chest was in 11/19, showing stable ILD (?UIP vs NSIP).  He had RHC in 2/20 with moderate PAH, PVR 5.1 WU.  He was tried on Adcirca but developed cognitive problems and worsening of peripheral edema to this was stopped.  There was plan to try him on ambrisentan in the future. He has been on home oxygen, uses at night and with exertion.   He was admitted with worsening dyspnea and peripheral edema, weight up despite compliance with torsemide 40 mg bid at home.  He has CKD stage 3 and torsemide was decreased in the spring. He has had Lasix 80 mg IV bid here, weight is down about 11 lbs. Still short of breath with exertion and noted to be somewhat dyspneic with conversation.   Review of Systems: All systems reviewed and negative except as per HPI.   Home Medications Prior to Admission medications   Medication Sig Start Date End Date Taking? Authorizing Provider  allopurinol (ZYLOPRIM) 100 MG tablet Take 50 mg by mouth daily. 12/13/17  Yes [provider]  finasteride (PROSCAR) 5 MG tablet Take 1 tablet (5 mg total) by mouth daily. 12/25/12  Yes Norins, Heinz Knuckles, MD  FLUoxetine (PROZAC) 10 MG capsule Take 10 mg by mouth daily. 07/06/18  Yes [provider]  nystatin (MYCOSTATIN) 100000 UNIT/ML  suspension Use as directed 5 mLs in the mouth or throat 4 (four) times daily. Swish and swallow 07/06/18  Yes [provider]  omeprazole (PRILOSEC) 20 MG capsule Take 20 mg by mouth daily.   Yes [provider]  potassium chloride SA (K-DUR,KLOR-CON) 20 MEQ tablet Take 20 mEq by mouth 2 (two) times daily.    Yes [provider]  pramipexole (MIRAPEX) 0.75 MG tablet Take 0.75 mg by mouth at bedtime.   Yes [provider]  spironolactone (ALDACTONE) 25 MG tablet Take 25 mg by mouth daily. 11/29/17  Yes [provider]  torsemide (DEMADEX) 20 MG tablet Take 40 mg by mouth 2 (two) times a day. 02/16/18 04/24/19 Yes [provider]  traMADol (ULTRAM) 50 MG tablet Take 50 mg by mouth as needed for moderate pain.    Yes [provider]  traZODone (DESYREL) 100 MG tablet Take 100 mg by mouth at bedtime. 12/13/17  Yes [provider]  triazolam (HALCION) 0.25 MG tablet Take 1 tablet by mouth at bedtime as needed for sleep.  02/25/14  Yes [provider]    Past Medical History: 1. Non-Hodgkin's lymphoma: Pelvic involvement.  S/p radiation.  Thought to be in remission (Dr. Benay Spice).  2. HTN 3. PACs/PVCs 4. GERD: Barrett's esophagus.  5. 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  - High resolution CT chest 11/19: ILD, ?UIP pattern.  6. Pulmonary hypertension: Thought to be out of proportion to underlying lung disease (ILD, ?NSIP  vs UIP).   - RHC (2/20): mean RA 10, PASP 60 with mean 42, mean PCWP 11, CI 3, PVR 5.1 WU.  - Trial of Adcirca => failed due to ?memory worsening/cognitive affects and peripheral edema.  - Echo (6/20): EF 60-65%, mild LVH, RV reportedly normal, PASP 59 mmHg.  7. CKD: Stage 3.   Past Surgical History: Past Surgical History:  Procedure Laterality Date  . ARTHROSCOPIC REPAIR ACL     right  . BONE MARROW ASPIRATION  02/25/11   Bone Marrow, Aspirate, Clot, and Bilateral Bx, Right PIC  . CATARACT EXTRACTION,  BILATERAL    . EYE SURGERY    . HERNIA REPAIR     LEFT INGUINAL   . INSERTION OF MESH N/A 08/23/2012   Procedure: INSERTION OF MESH;  Surgeon: Madilyn Hook, DO;  Location: [facility];  Service: General;  Laterality: N/A;  . LUMBAR LAMINECTOMY/DECOMPRESSION MICRODISCECTOMY Right 12/31/2013   Procedure: RIGHT L4-5 L5-S1 LAMINECTOMY;  Surgeon: Kristeen Miss, MD;  Location: [facility];  Service: Neurosurgery;  Laterality: Right;  RIGHT L4-5 L5-S1 LAMINECTOMY  . ROTATOR CUFF REPAIR     right  . TONSILLECTOMY    . TONSILLECTOMY    . VENTRAL HERNIA REPAIR N/A 08/23/2012   Procedure: HERNIA REPAIR VENTRAL ADULT;  Surgeon: Madilyn Hook, DO;  Location: [facility];  Service: General;  Laterality: N/A;    Family History: Family History  Problem Relation Age of Onset  . Heart disease Father        MI 65  . Urticaria Father   . Prostate cancer Brother   . Prostate cancer Paternal Uncle   . Prostate cancer Paternal Uncle   . Prostate cancer Paternal Uncle   . Hyperlipidemia Other   . Stroke Other   . Hypertension Other   . ADD / ADHD Other   . Colon cancer Neg Hx   . Allergic rhinitis Neg Hx   . Asthma Neg Hx   . Eczema Neg Hx     Social History: Social History   Socioeconomic History  . Marital status: Married    Spouse name: Not on file  . Number of children: 2  . Years of education: Not on file  . Highest education level: Not on file  Occupational History  . 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 Financial resource strain: Not on file  . Food insecurity    Worry: Not on file    Inability: Not on file  . Transportation needs    Medical: Not on file    Non-medical: Not on file  Tobacco Use  . Smoking status: Former Smoker    Packs/day: 1.00    Years: 35.00    Pack years: 35.00    Types: Pipe, Cigarettes    Quit date: 02/23/1992    Years since quitting: 26.4  . Smokeless tobacco: Never Used  . Tobacco comment: quit 20 years ago   Substance and Sexual Activity  . Alcohol use: Yes    Comment: 2 drinks daily scotch  AND WINE WITH SUPPER  . Drug use: No  . Sexual activity: Not on file  Lifestyle  . Physical activity    Days per week: Not on file    Minutes per session: Not on file  . Stress: Not on file  Relationships  . Social Herbalist on phone: Not on file    Gets together: Not on file    Attends  religious service: Not on file    Active member of club or organization: Not on file    Attends meetings of clubs or organizations: Not on file    Relationship status: Not on file  Other Topics Concern  . Not on file  Social History Narrative   Married - second marriage   He has two children   Retired Horticulturist, commercial Professor   Currently teaches pottery making, 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[facility]   Former Smoker quit 12 years ago- smoked for 35 years   Alcohol use- yes    Allergies:  Allergies  Allergen Reactions  . Pollen Extract-Tree Extract Other (See Comments)    HEADACHES, TIRED , DRAINAGE FROM SINUSES  . Molds & Smuts Other (See Comments)    Also dust mites causes sinus infections, h/a etc.    Objective:    Vital Signs:   Temp:  [97.6 F (36.4 C)-98.2 F (36.8 C)] 97.6 F (36.4 C) (06/22 0404) Pulse Rate:  [72-84] 72 (06/22 0839) Resp:  [18-20] 20 (06/22 0404) BP: (113-129)/(60-85) 114/75 (06/22 0839) SpO2:  [94 %-99 %] 99 % (06/21 2037) Weight:  [94.4 kg] 94.4 kg (06/22 0406) Last BM Date: 07/23/18  Weight change: Filed Weights   07/22/18 0500 07/23/18 0358 07/24/18 0406  Weight: 96.1 kg 95.5 kg 94.4 kg    Intake/Output:   Intake/Output Summary (Last 24 hours) at 07/24/2018 0932 Last data filed at 07/24/2018 0800 Gross per 24 hour  Intake 1043 ml  Output 2425 ml  Net -1382 ml      Physical Exam    General:  Well appearing. No resp difficulty HEENT: normal Neck: supple. JVP 10-12 cm. Carotids 2+ bilat; no bruits. No lymphadenopathy or thyromegaly appreciated. Cor:  PMI nondisplaced. Regular rate & rhythm. No rubs, gallops or murmurs. Lungs: Mild crackles at bases bilaterally.  Abdomen: soft, nontender, nondistended. No hepatosplenomegaly. No bruits or masses. Good bowel sounds. Extremities: no cyanosis, clubbing, rash, edema Neuro: alert & orientedx3, cranial nerves grossly intact. moves all 4 extremities w/o difficulty. Affect pleasant   Telemetry   NSR  EKG    NSR, iRBBB, PVC (personally reviewed)  Labs   Basic Metabolic Panel: Recent Labs  Lab 07/20/18 0408 07/21/18 0607 07/22/18 0436 07/23/18 0845 07/24/18 0536  NA 137 136 138 135 137  K 4.4 4.3 4.2 4.7 4.2  CL 91* 95* 95* 95* 96*  CO2 35* 30 35* 31 33*  GLUCOSE 141* 125* 115* 151* 114*  BUN 25* 27* 25* 25* 25*  CREATININE 1.41* 1.54* 1.57* 1.44* 1.49*  CALCIUM 8.7* 8.4* 8.2* 8.3* 8.2*    Liver Function Tests: Recent Labs  Lab 07/21/18 0607  AST 25  ALT 27  ALKPHOS 83  BILITOT 0.7  PROT 6.1*  ALBUMIN 3.3*   No results for input(s): LIPASE, AMYLASE in the last 168 hours. No results for input(s): AMMONIA in the last 168 hours.  CBC: Recent Labs  Lab 07/19/18 1452 07/20/18 0408 07/21/18 0607  WBC 8.9 8.2 7.9  NEUTROABS  --  6.0 5.6  HGB 15.0 15.5 15.2  HCT 49.5 50.9 49.3  MCV 95.0 95.0 94.3  PLT 231 258 220    Cardiac Enzymes: Recent Labs  Lab 07/19/18 1452  TROPONINI <0.03    BNP: BNP (last 3 results) Recent Labs    03/08/18 1859 07/19/18 1750  BNP 40.3 36.1    ProBNP (last 3 results) No results for input(s): PROBNP in the last 8760 hours.   CBG: No results  for input(s): GLUCAP in the last 168 hours.  Coagulation Studies: No results for input(s): LABPROT, INR in the last 72 hours.   Imaging    No results found.   Medications:     Current Medications: . allopurinol  50 mg Oral Daily  . enoxaparin (LOVENOX) injection  40 mg Subcutaneous Q24H  . finasteride  5 mg Oral Daily  . FLUoxetine  10 mg Oral Daily  . fluticasone  1  spray Each Nare Daily  . furosemide  80 mg Intravenous BID  . metolazone  2.5 mg Oral Once  . nystatin  5 mL Mouth/Throat QID  . pantoprazole  40 mg Oral Daily  . potassium chloride SA  20 mEq Oral BID  . pramipexole  0.75 mg Oral QHS  . sodium chloride flush  3 mL Intravenous Q12H  . sodium chloride flush  3 mL Intravenous Q12H  . spironolactone  25 mg Oral Daily  . traZODone  50 mg Oral QHS     Infusions: . sodium chloride         Assessment/Plan   1. Acute on chronic diastolic CHF: Suspect with significant component of RV failure from pulmonary hypertension.  Echo with EF 60-65%, RV reportedly not markedly abnormal but PASP 59 mmHg.  On exam today, he still looks volume overloaded though he has had reasonable diuresis so far in hospital.  Still dyspnea with exertion and with talking.   - Lasix 80 mg IV bid today, will give 1 dose of metolazone.  - Continue spironolactone 25 mg daily.  - With pulmonary hypertension, CHF, and CKD, will plan RHC tomorrow after further diuresis to make sure that he is optimized in terms of filling pressures and also to assess pulmonary pressure. Discussed risks/benefits with patient and he agrees to procedure.  2. CKD: Stage 3.  Creatinine stable with diuresis so far.  3. Pulmonary hypertnesion: Patient thought to have [place] out of proportion to his lung disease (ILD, NSIP versus UIP).  Last RHC in 2/20 showed mean PA pressure 41, PVR 5.1 WU, CI 3. Echo this admission with RV reportedly ok => I reviewed and do not think that RV was well-visualized.  Patient did not tolerate Adcirca.  - RHC as above.  - Would consider ambrisentan in future based on RHC findings (was planned by his [facility] physician).  - Will send serological workup as cannot see where this was done.  4. H/o PVCs: Follow on telemetry.   Length of Stay: [place], MD  07/24/2018, 9:32 AM  Advanced Heart Failure Team Pager [phone] (M-F; 7a - 4p)  Please contact [facility] for  night-coverage after hours (4p -7a ) and weekends on [email or web address]

## 2018-07-24 NOTE — Progress Notes (Signed)
PROGRESS NOTE    Ryan [name]  GLO:[id number] DOB: December 17, 1931 DOA: 07/19/2018 PCP: Chesley Noon, MD    Brief Narrative:85 y.o.malewith medical history significant ofdiastolic CHF, chronic lower extremity edema with venous insufficiency, Barrett's esophagus, BPH, gluten intolerance, hypertension, history of lymphoma, hyperlipidemia among other things who presents to the ER with worsening lower extremity edema including weeping of the feet. He has been on diuretics.He is on torsemide and has been compliant.Patient however appears to have anasarca at the moment. He has exertional dyspnea. Denied any cough. Denied any chest pain. Patient was seen and evaluated and found to have worsening edema. ER has contacted his cardiologist at [facility] who recommended IV diuretics and return to Lasix instead of torsemide at discharge. He is being admitted for treatment..  ED Course:Temperature is 99 blood pressure is 113/67 pulse 92 respiratory 28 oxygen sat 97% room air. CBC appears to be entirely within normal. Chemistry showed a chloride 93 otherwise glucose 133 BUN 29 creatinine 1.51. Calcium is 8.8. Troponin less than 0.03 BNP of 36. Coronavirus COVID-19 screen is negative. Chest x-ray showed large hiatal hernia with mild atelectasis. No obvious fluids. Patient has received 80 mg of Lasix in the ER Foley catheter inserted and he is being admitted for treatment   Briefly patient admitted with anasarca thought to have diastolic CHF exacerbation in spite of compliance with his medications and most likely his diet too.  Cardiology following planning for right heart cath 07/25/2018.  Assessment & Plan:   Principal Problem:   CHF exacerbation ([place]) Active Problems:   Hyperlipidemia   BPH (benign prostatic hyperplasia)   Chronic venous insufficiency   PEDAL EDEMA   Barrett's esophagus   Non Hodgkin's lymphoma (HCC)   Essential hypertension   Chronic diastolic heart failure (HCC)  COPD (HCC)   Bilateral lower extremity edema   Acute on chronic diastolic heart failure ([place])  #1 anasarca/acute on chronic diastolic CHF exacerbation -patient reports he was taking all his medications as prescribed and was monitoring his diet except during COVID he had to get fluid food from outside. Followed at [facility] by [facility] cardiologist Dr. Christa See. He has been on IV Lasix since admission. He is negative by 8.7L since admission. His creatinine is stable  with  diuresis. Echocardiogram this admission shows normal ejection fraction with grade 1 diastolic dysfunction.His LFTs have been normal albumin is3.3.Marland Kitchen -Patient continues with severe dyspnea on exertion and paroxysmal nocturnal dyspnea with fluid overload. He needs to continue IV diuresis. -Doppler of the lower extremities- no dvt - still volume overloaded needs iv diuresis  #2 CKD stage III creatininemonitor on IV diuresis creatinine trending down  #3 chronic venous insufficiency with bilateral lower extremity edema worse than baseline. They are warm to touch monitor closely.Have not started any antibiotics at this time.  #4 history of pulmonary fibrosis O2 dependent 2 L 24/7   #5 hypertension stable on meds  #6 GERD with Barrett's esophagus continue PPI  #7 hyperlipidemia on statins  #8 bilateral lower extremity edema erythema-slightly better than yesterday with ongoing diuresis.  DVT prophylaxis:Lovenox Code Status:Full code Family Communication:None patient did not wanted me to speak to anyone else he lives alone in an assisted living facility. Disposition Plan:Pending clinical improvement  consults called:Cardiology  Estimated body mass index is 32.61 kg/m as calculated from the following:   Height as of this encounter: _0  (1.702 m).   Weight as of this encounter: 94.4 kg.    Subjective:  Patient resting in bed feels  like the lower extremity swelling is decreasing breathing still hard  with any activity Objective: Vitals:   07/24/18 0404 07/24/18 0406 07/24/18 0839 07/24/18 1242  BP: 114/79  114/75 98/84  Pulse: 75  72 97  Resp: 20   19  Temp: 97.6 F (36.4 C)   98 F (36.7 C)  TempSrc: Oral   Oral  SpO2:    94%  Weight:  94.4 kg    Height:        Intake/Output Summary (Last 24 hours) at 07/24/2018 1306 Last data filed at 07/24/2018 1244 Gross per 24 hour  Intake 1400 ml  Output 2300 ml  Net -900 ml   Filed Weights   07/22/18 0500 07/23/18 0358 07/24/18 0406  Weight: 96.1 kg 95.5 kg 94.4 kg    Examination:  General exam: Appears calm and comfortable  Respiratory system: Diminished breath sounds at the bases to auscultation. Respiratory effort normal. Cardiovascular system: S1 & S2 heard, RRR. No JVD, murmurs, rubs, gallops or clicks. No pedal edema. Gastrointestinal system: Abdomen is nondistended, soft and nontender. No organomegaly or masses felt. Normal bowel sounds heard. Central nervous system: Alert and oriented. No focal neurological deficits. Extremities: 2+ edema decreasing Skin: No rashes, lesions or ulcers Psychiatry: Judgement and insight appear normal. Mood & affect appropriate.     Data Reviewed: I have personally reviewed following labs and imaging studies  CBC: Recent Labs  Lab 07/19/18 1452 07/20/18 0408 07/21/18 0607  WBC 8.9 8.2 7.9  NEUTROABS  --  6.0 5.6  HGB 15.0 15.5 15.2  HCT 49.5 50.9 49.3  MCV 95.0 95.0 94.3  PLT 231 258 720   Basic Metabolic Panel: Recent Labs  Lab 07/20/18 0408 07/21/18 0607 07/22/18 0436 07/23/18 0845 07/24/18 0536  NA 137 136 138 135 137  K 4.4 4.3 4.2 4.7 4.2  CL 91* 95* 95* 95* 96*  CO2 35* 30 35* 31 33*  GLUCOSE 141* 125* 115* 151* 114*  BUN 25* 27* 25* 25* 25*  CREATININE 1.41* 1.54* 1.57* 1.44* 1.49*  CALCIUM 8.7* 8.4* 8.2* 8.3* 8.2*   GFR: Estimated Creatinine Clearance: 40.4 mL/min (A) (by C-G formula based on SCr of 1.49 mg/dL (H)). Liver Function Tests: Recent Labs   Lab 07/21/18 0607  AST 25  ALT 27  ALKPHOS 83  BILITOT 0.7  PROT 6.1*  ALBUMIN 3.3*   No results for input(s): LIPASE, AMYLASE in the last 168 hours. No results for input(s): AMMONIA in the last 168 hours. Coagulation Profile: No results for input(s): INR, PROTIME in the last 168 hours. Cardiac Enzymes: Recent Labs  Lab 07/19/18 1452  TROPONINI <0.03   BNP (last 3 results) No results for input(s): PROBNP in the last 8760 hours. HbA1C: No results for input(s): HGBA1C in the last 72 hours. CBG: No results for input(s): GLUCAP in the last 168 hours. Lipid Profile: No results for input(s): CHOL, HDL, LDLCALC, TRIG, CHOLHDL, LDLDIRECT in the last 72 hours. Thyroid Function Tests: No results for input(s): TSH, T4TOTAL, FREET4, T3FREE, THYROIDAB in the last 72 hours. Anemia Panel: No results for input(s): VITAMINB12, FOLATE, FERRITIN, TIBC, IRON, RETICCTPCT in the last 72 hours. Sepsis Labs: No results for input(s): PROCALCITON, LATICACIDVEN in the last 168 hours.  Recent Results (from the past 240 hour(s))  Novel Coronavirus,NAA,(SEND-OUT TO REF LAB - TAT 24-48 hrs); Hosp Order     Status: None   Collection Time: 07/19/18  5:50 PM   Specimen: Nasopharyngeal Swab; Respiratory  Result Value Ref Range Status  SARS-CoV-2, NAA NOT DETECTED NOT DETECTED Final    Comment: (NOTE) This test was developed and its performance characteristics determined by [employer]. This test has not been FDA cleared or approved. This test has been authorized by FDA under an Emergency Use Authorization (EUA). This test is only authorized for the duration of time the declaration that circumstances exist justifying the authorization of the emergency use of in vitro diagnostic tests for detection of SARS-CoV-2 virus and/or diagnosis of COVID-19 infection under section 564(b)(1) of the Act, 21 U.S.C. 664QIH-4(V)(4), unless the authorization is terminated or revoked sooner. When diagnostic  testing is negative, the possibility of a false negative result should be considered in the context of a patient's recent exposures and the presence of clinical signs and symptoms consistent with COVID-19. An individual without symptoms of COVID-19 and who is not shedding SARS-CoV-2 virus would expect to have a negative (not detected) result in this assay. Performed  At: [facility] [address] [place], [place] [id number] Rush Farmer MD IE:[id number]    [place]  Final    Comment: Performed at [facility], [place] [address], [place], [place] [address]         Radiology Studies: No results found.      Scheduled Meds: . allopurinol  50 mg Oral Daily  . enoxaparin (LOVENOX) injection  40 mg Subcutaneous Q24H  . finasteride  5 mg Oral Daily  . FLUoxetine  10 mg Oral Daily  . fluticasone  1 spray Each Nare Daily  . furosemide  80 mg Intravenous BID  . nystatin  5 mL Mouth/Throat QID  . pantoprazole  40 mg Oral Daily  . potassium chloride SA  20 mEq Oral BID  . pramipexole  0.75 mg Oral QHS  . sodium chloride flush  3 mL Intravenous Q12H  . sodium chloride flush  3 mL Intravenous Q12H  . spironolactone  25 mg Oral Daily  . traZODone  50 mg Oral QHS   Continuous Infusions: . sodium chloride       LOS: 5 days     Georgette Shell, MD [facility]  If 7PM-7AM, please contact night-coverage [email or web address] Password [email or web address] 07/24/2018, 1:06 PM

## 2018-07-24 NOTE — H&P (View-Only) (Signed)
Advanced Heart Failure Team Consult Note   Primary Physician: Chesley Noon, MD PCP-Cardiologist:  [name], MD  Reason for Consultation: CHF  HPI:    Ryan [name] is seen today for evaluation of CHF/pulmonary hypertension at the request of Dr. Radford Pax.   85 yo with history of interstitial lung disease (?NSIP vs UIP) on home oxygen, pulmonary hypertension, diastolic CHF, and prior non-Hodgkins lymphoma (pelvic mass, s/p XRT) was admitted for CHF exacerbation.  Patient has history of reportedly mild ILD (NSIP vs UIP) followed by a pulmonologist at [facility] and pulmonary hypertension thought to be out of proportion lung disease followed by a physician at [facility].  Last CT chest was in 11/19, showing stable ILD (?UIP vs NSIP).  He had RHC in 2/20 with moderate PAH, PVR 5.1 WU.  He was tried on Adcirca but developed cognitive problems and worsening of peripheral edema to this was stopped.  There was plan to try him on ambrisentan in the future. He has been on home oxygen, uses at night and with exertion.   He was admitted with worsening dyspnea and peripheral edema, weight up despite compliance with torsemide 40 mg bid at home.  He has CKD stage 3 and torsemide was decreased in the spring. He has had Lasix 80 mg IV bid here, weight is down about 11 lbs. Still short of breath with exertion and noted to be somewhat dyspneic with conversation.   Review of Systems: All systems reviewed and negative except as per HPI.   Home Medications Prior to Admission medications   Medication Sig Start Date End Date Taking? Authorizing Provider  allopurinol (ZYLOPRIM) 100 MG tablet Take 50 mg by mouth daily. 12/13/17  Yes [provider]  finasteride (PROSCAR) 5 MG tablet Take 1 tablet (5 mg total) by mouth daily. 12/25/12  Yes Norins, Heinz Knuckles, MD  FLUoxetine (PROZAC) 10 MG capsule Take 10 mg by mouth daily. 07/06/18  Yes [provider]  nystatin (MYCOSTATIN) 100000 UNIT/ML  suspension Use as directed 5 mLs in the mouth or throat 4 (four) times daily. Swish and swallow 07/06/18  Yes [provider]  omeprazole (PRILOSEC) 20 MG capsule Take 20 mg by mouth daily.   Yes [provider]  potassium chloride SA (K-DUR,KLOR-CON) 20 MEQ tablet Take 20 mEq by mouth 2 (two) times daily.    Yes [provider]  pramipexole (MIRAPEX) 0.75 MG tablet Take 0.75 mg by mouth at bedtime.   Yes [provider]  spironolactone (ALDACTONE) 25 MG tablet Take 25 mg by mouth daily. 11/29/17  Yes [provider]  torsemide (DEMADEX) 20 MG tablet Take 40 mg by mouth 2 (two) times a day. 02/16/18 04/24/19 Yes [provider]  traMADol (ULTRAM) 50 MG tablet Take 50 mg by mouth as needed for moderate pain.    Yes [provider]  traZODone (DESYREL) 100 MG tablet Take 100 mg by mouth at bedtime. 12/13/17  Yes [provider]  triazolam (HALCION) 0.25 MG tablet Take 1 tablet by mouth at bedtime as needed for sleep.  02/25/14  Yes [provider]    Past Medical History: 1. Non-Hodgkin's lymphoma: Pelvic involvement.  S/p radiation.  Thought to be in remission (Dr. Benay Spice).  2. HTN 3. PACs/PVCs 4. GERD: Barrett's esophagus.  5. 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  - High resolution CT chest 11/19: ILD, ?UIP pattern.  6. Pulmonary hypertension: Thought to be out of proportion to underlying lung disease (ILD, ?NSIP  vs UIP).   - RHC (2/20): mean RA 10, PASP 60 with mean 42, mean PCWP 11, CI 3, PVR 5.1 WU.  - Trial of Adcirca => failed due to ?memory worsening/cognitive affects and peripheral edema.  - Echo (6/20): EF 60-65%, mild LVH, RV reportedly normal, PASP 59 mmHg.  7. CKD: Stage 3.   Past Surgical History: Past Surgical History:  Procedure Laterality Date  . ARTHROSCOPIC REPAIR ACL     right  . BONE MARROW ASPIRATION  02/25/11   Bone Marrow, Aspirate, Clot, and Bilateral Bx, Right PIC  . CATARACT EXTRACTION,  BILATERAL    . EYE SURGERY    . HERNIA REPAIR     LEFT INGUINAL   . INSERTION OF MESH N/A 08/23/2012   Procedure: INSERTION OF MESH;  Surgeon: Madilyn Hook, DO;  Location: [facility];  Service: General;  Laterality: N/A;  . LUMBAR LAMINECTOMY/DECOMPRESSION MICRODISCECTOMY Right 12/31/2013   Procedure: RIGHT L4-5 L5-S1 LAMINECTOMY;  Surgeon: Kristeen Miss, MD;  Location: [facility];  Service: Neurosurgery;  Laterality: Right;  RIGHT L4-5 L5-S1 LAMINECTOMY  . ROTATOR CUFF REPAIR     right  . TONSILLECTOMY    . TONSILLECTOMY    . VENTRAL HERNIA REPAIR N/A 08/23/2012   Procedure: HERNIA REPAIR VENTRAL ADULT;  Surgeon: Madilyn Hook, DO;  Location: [facility];  Service: General;  Laterality: N/A;    Family History: Family History  Problem Relation Age of Onset  . Heart disease Father        MI 65  . Urticaria Father   . Prostate cancer Brother   . Prostate cancer Paternal Uncle   . Prostate cancer Paternal Uncle   . Prostate cancer Paternal Uncle   . Hyperlipidemia Other   . Stroke Other   . Hypertension Other   . ADD / ADHD Other   . Colon cancer Neg Hx   . Allergic rhinitis Neg Hx   . Asthma Neg Hx   . Eczema Neg Hx     Social History: Social History   Socioeconomic History  . Marital status: Married    Spouse name: Not on file  . Number of children: 2  . Years of education: Not on file  . Highest education level: Not on file  Occupational History  . 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 Financial resource strain: Not on file  . Food insecurity    Worry: Not on file    Inability: Not on file  . Transportation needs    Medical: Not on file    Non-medical: Not on file  Tobacco Use  . Smoking status: Former Smoker    Packs/day: 1.00    Years: 35.00    Pack years: 35.00    Types: Pipe, Cigarettes    Quit date: 02/23/1992    Years since quitting: 26.4  . Smokeless tobacco: Never Used  . Tobacco comment: quit 20 years ago   Substance and Sexual Activity  . Alcohol use: Yes    Comment: 2 drinks daily scotch  AND WINE WITH SUPPER  . Drug use: No  . Sexual activity: Not on file  Lifestyle  . Physical activity    Days per week: Not on file    Minutes per session: Not on file  . Stress: Not on file  Relationships  . Social Herbalist on phone: Not on file    Gets together: Not on file    Attends  religious service: Not on file    Active member of club or organization: Not on file    Attends meetings of clubs or organizations: Not on file    Relationship status: Not on file  Other Topics Concern  . Not on file  Social History Narrative   Married - second marriage   He has two children   Retired Horticulturist, commercial Professor   Currently teaches pottery making, 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[facility]   Former Smoker quit 12 years ago- smoked for 35 years   Alcohol use- yes    Allergies:  Allergies  Allergen Reactions  . Pollen Extract-Tree Extract Other (See Comments)    HEADACHES, TIRED , DRAINAGE FROM SINUSES  . Molds & Smuts Other (See Comments)    Also dust mites causes sinus infections, h/a etc.    Objective:    Vital Signs:   Temp:  [97.6 F (36.4 C)-98.2 F (36.8 C)] 97.6 F (36.4 C) (06/22 0404) Pulse Rate:  [72-84] 72 (06/22 0839) Resp:  [18-20] 20 (06/22 0404) BP: (113-129)/(60-85) 114/75 (06/22 0839) SpO2:  [94 %-99 %] 99 % (06/21 2037) Weight:  [94.4 kg] 94.4 kg (06/22 0406) Last BM Date: 07/23/18  Weight change: Filed Weights   07/22/18 0500 07/23/18 0358 07/24/18 0406  Weight: 96.1 kg 95.5 kg 94.4 kg    Intake/Output:   Intake/Output Summary (Last 24 hours) at 07/24/2018 0932 Last data filed at 07/24/2018 0800 Gross per 24 hour  Intake 1043 ml  Output 2425 ml  Net -1382 ml      Physical Exam    General:  Well appearing. No resp difficulty HEENT: normal Neck: supple. JVP 10-12 cm. Carotids 2+ bilat; no bruits. No lymphadenopathy or thyromegaly appreciated. Cor:  PMI nondisplaced. Regular rate & rhythm. No rubs, gallops or murmurs. Lungs: Mild crackles at bases bilaterally.  Abdomen: soft, nontender, nondistended. No hepatosplenomegaly. No bruits or masses. Good bowel sounds. Extremities: no cyanosis, clubbing, rash, edema Neuro: alert & orientedx3, cranial nerves grossly intact. moves all 4 extremities w/o difficulty. Affect pleasant   Telemetry   NSR  EKG    NSR, iRBBB, PVC (personally reviewed)  Labs   Basic Metabolic Panel: Recent Labs  Lab 07/20/18 0408 07/21/18 0607 07/22/18 0436 07/23/18 0845 07/24/18 0536  NA 137 136 138 135 137  K 4.4 4.3 4.2 4.7 4.2  CL 91* 95* 95* 95* 96*  CO2 35* 30 35* 31 33*  GLUCOSE 141* 125* 115* 151* 114*  BUN 25* 27* 25* 25* 25*  CREATININE 1.41* 1.54* 1.57* 1.44* 1.49*  CALCIUM 8.7* 8.4* 8.2* 8.3* 8.2*    Liver Function Tests: Recent Labs  Lab 07/21/18 0607  AST 25  ALT 27  ALKPHOS 83  BILITOT 0.7  PROT 6.1*  ALBUMIN 3.3*   No results for input(s): LIPASE, AMYLASE in the last 168 hours. No results for input(s): AMMONIA in the last 168 hours.  CBC: Recent Labs  Lab 07/19/18 1452 07/20/18 0408 07/21/18 0607  WBC 8.9 8.2 7.9  NEUTROABS  --  6.0 5.6  HGB 15.0 15.5 15.2  HCT 49.5 50.9 49.3  MCV 95.0 95.0 94.3  PLT 231 258 220    Cardiac Enzymes: Recent Labs  Lab 07/19/18 1452  TROPONINI <0.03    BNP: BNP (last 3 results) Recent Labs    03/08/18 1859 07/19/18 1750  BNP 40.3 36.1    ProBNP (last 3 results) No results for input(s): PROBNP in the last 8760 hours.   CBG: No results  for input(s): GLUCAP in the last 168 hours.  Coagulation Studies: No results for input(s): LABPROT, INR in the last 72 hours.   Imaging    No results found.   Medications:     Current Medications: . allopurinol  50 mg Oral Daily  . enoxaparin (LOVENOX) injection  40 mg Subcutaneous Q24H  . finasteride  5 mg Oral Daily  . FLUoxetine  10 mg Oral Daily  . fluticasone  1  spray Each Nare Daily  . furosemide  80 mg Intravenous BID  . metolazone  2.5 mg Oral Once  . nystatin  5 mL Mouth/Throat QID  . pantoprazole  40 mg Oral Daily  . potassium chloride SA  20 mEq Oral BID  . pramipexole  0.75 mg Oral QHS  . sodium chloride flush  3 mL Intravenous Q12H  . sodium chloride flush  3 mL Intravenous Q12H  . spironolactone  25 mg Oral Daily  . traZODone  50 mg Oral QHS     Infusions: . sodium chloride         Assessment/Plan   1. Acute on chronic diastolic CHF: Suspect with significant component of RV failure from pulmonary hypertension.  Echo with EF 60-65%, RV reportedly not markedly abnormal but PASP 59 mmHg.  On exam today, he still looks volume overloaded though he has had reasonable diuresis so far in hospital.  Still dyspnea with exertion and with talking.   - Lasix 80 mg IV bid today, will give 1 dose of metolazone.  - Continue spironolactone 25 mg daily.  - With pulmonary hypertension, CHF, and CKD, will plan RHC tomorrow after further diuresis to make sure that he is optimized in terms of filling pressures and also to assess pulmonary pressure. Discussed risks/benefits with patient and he agrees to procedure.  2. CKD: Stage 3.  Creatinine stable with diuresis so far.  3. Pulmonary hypertnesion: Patient thought to have [place] out of proportion to his lung disease (ILD, NSIP versus UIP).  Last RHC in 2/20 showed mean PA pressure 41, PVR 5.1 WU, CI 3. Echo this admission with RV reportedly ok => I reviewed and do not think that RV was well-visualized.  Patient did not tolerate Adcirca.  - RHC as above.  - Would consider ambrisentan in future based on RHC findings (was planned by his [facility] physician).  - Will send serological workup as cannot see where this was done.  4. H/o PVCs: Follow on telemetry.   Length of Stay: [place], MD  07/24/2018, 9:32 AM  Advanced Heart Failure Team Pager [phone] (M-F; 7a - 4p)  Please contact [facility] for  night-coverage after hours (4p -7a ) and weekends on [email or web address]

## 2018-07-24 NOTE — Care Management Important Message (Signed)
Important Message  Patient Details  Name: BRYSON GAVIA MRN: [id number] Date of Birth: 1931/11/21   Medicare Important Message Given:  Yes     Shelda Altes 07/24/2018, 12:42 PM

## 2018-07-24 NOTE — Plan of Care (Signed)
  Problem: Education: Goal: Knowledge of General Education information will improve Description: Including pain rating scale, medication(s)/side effects and non-pharmacologic comfort measures Outcome: Progressing   Problem: Health Behavior/Discharge Planning: Goal: Ability to manage health-related needs will improve Outcome: Progressing   Problem: Clinical Measurements: Goal: Respiratory complications will improve Outcome: Progressing   Problem: Elimination: Goal: Will not experience complications related to bowel motility Outcome: Progressing   Problem: Pain Managment: Goal: General experience of comfort will improve Outcome: Progressing   Problem: Education: Goal: Ability to demonstrate management of disease process will improve Outcome: Progressing

## 2018-07-25 ENCOUNTER — Encounter (HOSPITAL_COMMUNITY)
Admission: EM | Disposition: A | Discharge status: Home Health | Payer: Self-pay | Source: Home / Self Care | Attending: Internal Medicine

## 2018-07-25 ENCOUNTER — Encounter (HOSPITAL_COMMUNITY): Payer: Self-pay | Admitting: Cardiology

## 2018-07-25 DIAGNOSIS — I509 Heart failure, unspecified: Secondary | ICD-10-CM

## 2018-07-25 DIAGNOSIS — I272 Pulmonary hypertension, unspecified: Secondary | ICD-10-CM

## 2018-07-25 DIAGNOSIS — N183 Chronic kidney disease, stage 3 (moderate): Secondary | ICD-10-CM

## 2018-07-25 HISTORY — PX: RIGHT HEART CATH: CATH118263

## 2018-07-25 LAB — POCT I-STAT EG7
Acid-Base Excess: 12 mmol/L — ABNORMAL HIGH (ref 0.0–2.0)
Acid-Base Excess: 10 mmol/L — ABNORMAL HIGH (ref 0.0–2.0)
Bicarbonate: 39.8 mmol/L — ABNORMAL HIGH (ref 20.0–28.0)
Bicarbonate: 38.6 mmol/L — ABNORMAL HIGH (ref 20.0–28.0)
Calcium, Ion: 1.15 mmol/L (ref 1.15–1.40)
Calcium, Ion: 1.14 mmol/L — ABNORMAL LOW (ref 1.15–1.40)
HCT: 46 % (ref 39.0–52.0)
HCT: 46 % (ref 39.0–52.0)
Hemoglobin: 15.6 g/dL (ref 13.0–17.0)
Hemoglobin: 15.6 g/dL (ref 13.0–17.0)
O2 Saturation: 63 %
O2 Saturation: 68 %
Potassium: 3.9 mmol/L (ref 3.5–5.1)
Potassium: 3.8 mmol/L (ref 3.5–5.1)
Sodium: 136 mmol/L (ref 135–145)
Sodium: 136 mmol/L (ref 135–145)
TCO2: 42 mmol/L — ABNORMAL HIGH (ref 22–32)
TCO2: 41 mmol/L — ABNORMAL HIGH (ref 22–32)
pCO2, Ven: 64.8 mmHg — ABNORMAL HIGH (ref 44.0–60.0)
pCO2, Ven: 65 mmHg — ABNORMAL HIGH (ref 44.0–60.0)
pH, Ven: 7.397 (ref 7.250–7.430)
pH, Ven: 7.382 (ref 7.250–7.430)
pO2, Ven: 34 mmHg (ref 32.0–45.0)
pO2, Ven: 38 mmHg (ref 32.0–45.0)

## 2018-07-25 LAB — BASIC METABOLIC PANEL
Anion gap: 13 (ref 5–15)
Anion gap: 11 (ref 5–15)
BUN: 29 mg/dL — ABNORMAL HIGH (ref 8–23)
BUN: 32 mg/dL — ABNORMAL HIGH (ref 8–23)
CO2: 27 mmol/L (ref 22–32)
CO2: 33 mmol/L — ABNORMAL HIGH (ref 22–32)
Calcium: 8.2 mg/dL — ABNORMAL LOW (ref 8.9–10.3)
Calcium: 8.6 mg/dL — ABNORMAL LOW (ref 8.9–10.3)
Chloride: 95 mmol/L — ABNORMAL LOW (ref 98–111)
Chloride: 93 mmol/L — ABNORMAL LOW (ref 98–111)
Creatinine, Ser: 1.49 mg/dL — ABNORMAL HIGH (ref 0.61–1.24)
Creatinine, Ser: 1.95 mg/dL — ABNORMAL HIGH (ref 0.61–1.24)
GFR calc Af Amer: 49 mL/min — ABNORMAL LOW (ref >60)
GFR calc Af Amer: 36 mL/min — ABNORMAL LOW (ref >60)
GFR calc non Af Amer: 42 mL/min — ABNORMAL LOW (ref >60)
GFR calc non Af Amer: 31 mL/min — ABNORMAL LOW (ref >60)
Glucose, Bld: 86 mg/dL (ref 70–99)
Glucose, Bld: 189 mg/dL — ABNORMAL HIGH (ref 70–99)
Potassium: 5.9 mmol/L — ABNORMAL HIGH (ref 3.5–5.1)
Potassium: 3.6 mmol/L (ref 3.5–5.1)
Sodium: 135 mmol/L (ref 135–145)
Sodium: 137 mmol/L (ref 135–145)

## 2018-07-25 LAB — ANTI-SCLERODERMA ANTIBODY: Scleroderma (Scl-70) (ENA) Antibody, IgG: <0.2 AI (ref 0.0–0.9)

## 2018-07-25 LAB — ANA: Anti Nuclear Antibody (ANA): NEGATIVE

## 2018-07-25 LAB — CENTROMERE ANTIBODIES: Centromere Ab Screen: <0.2 AI (ref 0.0–0.9)

## 2018-07-25 LAB — RHEUMATOID FACTOR: Rheumatoid fact SerPl-aCnc: <10 IU/mL (ref 0.0–13.9)

## 2018-07-25 SURGERY — RIGHT HEART CATH
Anesthesia: LOCAL

## 2018-07-25 MED ORDER — TORSEMIDE 20 MG PO TABS: 60.0000 mg | ORAL_TABLET | Freq: Two times a day (BID) | ORAL | Status: DC

## 2018-07-25 MED ORDER — TORSEMIDE 20 MG PO TABS
60.0000 mg | ORAL_TABLET | Freq: Two times a day (BID) | ORAL | Status: DC
  Administered 2018-07-25: 60 mg via ORAL
  Filled 2018-07-25: qty 3

## 2018-07-25 MED ORDER — SODIUM POLYSTYRENE SULFONATE 15 GM/60ML PO SUSP
30.0000 g | Freq: Once | ORAL | Status: AC
  Administered 2018-07-25: 30 g via ORAL
  Filled 2018-07-25: qty 120

## 2018-07-25 MED ORDER — LIDOCAINE HCL (PF) 1 % IJ SOLN
INTRAMUSCULAR | Status: AC
  Filled 2018-07-25: qty 30

## 2018-07-25 MED ORDER — SPIRONOLACTONE 25 MG PO TABS: 25.0000 mg | ORAL_TABLET | Freq: Every day | ORAL | Status: DC

## 2018-07-25 MED ORDER — HEPARIN (PORCINE) IN NACL 1000-0.9 UT/500ML-% IV SOLN
INTRAVENOUS | Status: AC
  Filled 2018-07-25: qty 500

## 2018-07-25 MED ORDER — SALINE SPRAY 0.65 % NA SOLN
1.0000 | NASAL | Status: DC | PRN
  Administered 2018-07-25: 12:00:00 1 via NASAL
  Filled 2018-07-25: qty 44

## 2018-07-25 MED ORDER — HEPARIN (PORCINE) IN NACL 1000-0.9 UT/500ML-% IV SOLN
INTRAVENOUS | Status: DC | PRN
  Administered 2018-07-25: 500 mL

## 2018-07-25 MED ORDER — AMBRISENTAN 5 MG PO TABS
5.0000 mg | ORAL_TABLET | Freq: Every day | ORAL | Status: DC
  Administered 2018-07-25 – 2018-07-26 (×2): 5 mg via ORAL
  Filled 2018-07-25 (×2): qty 1

## 2018-07-25 MED ORDER — LIDOCAINE HCL (PF) 1 % IJ SOLN
INTRAMUSCULAR | Status: DC | PRN
  Administered 2018-07-25: 2 mL

## 2018-07-25 SURGICAL SUPPLY — 6 items
CATH BALLN WEDGE 5F 110CM (CATHETERS) ×1 IMPLANT
PACK CARDIAC CATHETERIZATION (CUSTOM PROCEDURE TRAY) ×2 IMPLANT
SHEATH GLIDE SLENDER 4/5FR (SHEATH) ×1 IMPLANT
TRANSDUCER W/STOPCOCK (MISCELLANEOUS) ×2 IMPLANT
TUBING ART PRESS 72  MALE/MALE (TUBING) ×1 IMPLANT
TUBING CIL FLEX 10 FLL-RA (TUBING) ×2 IMPLANT

## 2018-07-25 NOTE — Interval H&P Note (Signed)
History and Physical Interval Note:  07/25/2018 7:49 AM  Ryan [name]  has presented today for surgery, with the diagnosis of hypertension.  The various methods of treatment have been discussed with the patient and family. After consideration of risks, benefits and other options for treatment, the patient has consented to  Procedure(s): RIGHT HEART CATH (N/A) as a surgical intervention.  The patient's history has been reviewed, patient examined, no change in status, stable for surgery.  I have reviewed the patient's chart and labs.  Questions were answered to the patient's satisfaction.     Ryan [name] [employer]

## 2018-07-25 NOTE — Plan of Care (Signed)

## 2018-07-25 NOTE — Progress Notes (Signed)
No asa needed for cath per MD

## 2018-07-25 NOTE — Progress Notes (Addendum)
Patient ID: Ryan [name], male   DOB: 1931-03-25, 85 y.o.   MRN: [id number]     Advanced Heart Failure Rounding Note  PCP-Cardiologist: [name], MD   Subjective:    Some shortness of breath overnight, he feels that this was due to sinus congestion and having to breathe through his mouth.  Weight down 3 more pounds with diuresis.  No labs yet this morning.   Rf negative  RHC done today:  RHC Procedural Findings: Hemodynamics (mmHg) RA mean 7 RV 47/9 PA 50/24, mean 34 PCWP mean 12 Oxygen saturations: PA 65% AO 97% Cardiac Output (Fick) 4.11  Cardiac Index (Fick) 2.01 PVR 5.35 WU PAPI 3.7   Objective:   Weight Range: 93.1 kg Body mass index is 32.14 kg/m.   Vital Signs:   Temp:  [97.5 F (36.4 C)-98.5 F (36.9 C)] 98.5 F (36.9 C) (06/23 0512) Pulse Rate:  [72-97] 81 (06/23 0807) Resp:  [0-20] 0 (06/23 0807) BP: (98-149)/(74-99) 149/99 (06/23 0807) SpO2:  [0 %-100 %] 0 % (06/23 0807) Weight:  [93.1 kg] 93.1 kg (06/23 0515) Last BM Date: 07/24/18  Weight change: Filed Weights   07/23/18 0358 07/24/18 0406 07/25/18 0515  Weight: 95.5 kg 94.4 kg 93.1 kg    Intake/Output:   Intake/Output Summary (Last 24 hours) at 07/25/2018 0815 Last data filed at 07/25/2018 0600 Gross per 24 hour  Intake 1448.58 ml  Output 2265 ml  Net -816.42 ml      Physical Exam    General:  Well appearing. No resp difficulty HEENT: Normal Neck: Supple. JVP not elevated. Carotids 2+ bilat; no bruits. No lymphadenopathy or thyromegaly appreciated. Cor: PMI nondisplaced. Regular rate & rhythm. No rubs, gallops or murmurs. Lungs: Clear Abdomen: Soft, nontender, nondistended. No hepatosplenomegaly. No bruits or masses. Good bowel sounds. Extremities: No cyanosis, clubbing, rash.  1+ ankle edema.  Neuro: Alert & orientedx3, cranial nerves grossly intact. moves all 4 extremities w/o difficulty. Affect pleasant   Telemetry   NSR (personally reviewed)  Labs    CBC No  results for input(s): WBC, NEUTROABS, HGB, HCT, MCV, PLT in the last 72 hours. Basic Metabolic Panel Recent Labs    07/23/18 0845 07/24/18 0536  NA 135 137  K 4.7 4.2  CL 95* 96*  CO2 31 33*  GLUCOSE 151* 114*  BUN 25* 25*  CREATININE 1.44* 1.49*  CALCIUM 8.3* 8.2*   Liver Function Tests No results for input(s): AST, ALT, ALKPHOS, BILITOT, PROT, ALBUMIN in the last 72 hours. No results for input(s): LIPASE, AMYLASE in the last 72 hours. Cardiac Enzymes No results for input(s): CKTOTAL, CKMB, CKMBINDEX, TROPONINI in the last 72 hours.  BNP: BNP (last 3 results) Recent Labs    03/08/18 1859 07/19/18 1750  BNP 40.3 36.1    ProBNP (last 3 results) No results for input(s): PROBNP in the last 8760 hours.   D-Dimer No results for input(s): DDIMER in the last 72 hours. Hemoglobin A1C No results for input(s): HGBA1C in the last 72 hours. Fasting Lipid Panel No results for input(s): CHOL, HDL, LDLCALC, TRIG, CHOLHDL, LDLDIRECT in the last 72 hours. Thyroid Function Tests No results for input(s): TSH, T4TOTAL, T3FREE, THYROIDAB in the last 72 hours.  Invalid input(s): FREET3  Other results:   Imaging     No results found.   Medications:     Scheduled Medications: . [MAR Hold] allopurinol  50 mg Oral Daily  . [MAR Hold] enoxaparin (LOVENOX) injection  40 mg Subcutaneous Q24H  . [  MAR Hold] finasteride  5 mg Oral Daily  . [MAR Hold] FLUoxetine  10 mg Oral Daily  . [MAR Hold] fluticasone  1 spray Each Nare Daily  . [MAR Hold] furosemide  80 mg Intravenous BID  . [MAR Hold] nystatin  5 mL Mouth/Throat QID  . [MAR Hold] pantoprazole  40 mg Oral Daily  . [MAR Hold] potassium chloride SA  20 mEq Oral BID  . [MAR Hold] pramipexole  0.75 mg Oral QHS  . [MAR Hold] sodium chloride flush  3 mL Intravenous Q12H  . [MAR Hold] sodium chloride flush  3 mL Intravenous Q12H  . [MAR Hold] spironolactone  25 mg Oral Daily  . [MAR Hold] traZODone  50 mg Oral QHS      Infusions: . [MAR Hold] sodium chloride    . sodium chloride    . sodium chloride 10 mL/hr at 07/25/18 0508     PRN Medications:  [MAR Hold] sodium chloride, sodium chloride, [MAR Hold] acetaminophen, [MAR Hold] magnesium hydroxide, [MAR Hold] ondansetron (ZOFRAN) IV, [MAR Hold] sodium chloride flush, sodium chloride flush, [MAR Hold] traMADol, [MAR Hold] triazolam    Assessment/Plan   1. Acute on chronic diastolic CHF: Suspect with significant component of RV failure from pulmonary hypertension.  Echo with EF 60-65%, RV not well-visualized but PASP 59 mmHg.  He diuresed well again, weight down.  On RHC today, filling pressures look optimized.  - Transition back to po diuretic, will give torsemide 60 mg po bid (was on 40 mg po bid at home).   - Continue spironolactone 25 mg daily.  - Ted hose.  2. CKD: Stage 3.  Creatinine stable with diuresis so far. Needs BMET today.  3. Pulmonary hypertnesion: Patient thought to have [place] out of proportion to his lung disease (ILD, NSIP versus UIP). RHC in 2/20 showed mean PA pressure 41, PVR 5.1 WU, CI 3. Echo this admission with RV reportedly ok => I reviewed and do not think that RV was well-visualized.  Patient did not tolerate Adcirca due to edema/?cognitive effects.  RHC was done today, showed moderate pulmonary arterial hypertension with PVR 5.35 (comparable to prior).  - He has ambrisentan at home, think he can start this at 5 mg daily.  Will start here to see if he tolerates.  - Sent serological workup as cannot see where this was done.  Rf negative. - had remote sleep study, likely should have repeat => can do home sleep study.   4. H/o PVCs: Follow on telemetry.  5. Disposition: Should be ready for home tomorrow from my standpoint, will give a dose of Letairis today.  We discussed followup, we will see him in CHF clinic given difficulty getting to [facility].   Length of Stay: 6  Loralie Champagne, MD  07/25/2018, 8:15 AM  Advanced Heart Failure  Team Pager [phone] (M-F; [place])  Please contact [facility] for night-coverage after hours (4p -7a ) and weekends on [email or web address]

## 2018-07-25 NOTE — Progress Notes (Addendum)
PROGRESS NOTE    Ryan [name]  WGY:[id number] DOB: 21-Jun-1931 DOA: 07/19/2018 PCP: Chesley Noon, MD   Brief Narrative:86 y.o.malewith medical history significant ofdiastolic CHF, chronic lower extremity edema with venous insufficiency, Barrett's esophagus, BPH, gluten intolerance, hypertension, history of lymphoma, hyperlipidemia among other things who presents to the ER with worsening lower extremity edema including weeping of the feet. He has been on diuretics.He is on torsemide and has been compliant.Patient however appears to have anasarca at the moment. He has exertional dyspnea. Denied any cough. Denied any chest pain. Patient was seen and evaluated and found to have worsening edema. ER has contacted his cardiologist at [facility] who recommended IV diuretics and return to Lasix instead of torsemide at discharge. He is being admitted for treatment..  ED Course:Temperature is 99 blood pressure is 113/67 pulse 92 respiratory 28 oxygen sat 97% room air. CBC appears to be entirely within normal. Chemistry showed a chloride 93 otherwise glucose 133 BUN 29 creatinine 1.51. Calcium is 8.8. Troponin less than 0.03 BNP of 36. Coronavirus COVID-19 screen is negative. Chest x-ray showed large hiatal hernia with mild atelectasis. No obvious fluids. Patient has received 80 mg of Lasix in the ER Foley catheter inserted and he is being admitted for treatment   Briefly patient admitted with anasarca thought to have diastolic CHF exacerbation in spite of compliance with his medications and most likely his diet too.  Cardiology following planning for right heart cath 07/25/2018.  Assessment & Plan:   Principal Problem:   CHF exacerbation ([place]) Active Problems:   Hyperlipidemia   BPH (benign prostatic hyperplasia)   Chronic venous insufficiency   PEDAL EDEMA   Barrett's esophagus   Non Hodgkin's lymphoma (HCC)   Essential hypertension   Chronic diastolic heart failure (HCC)  COPD (HCC)   Bilateral lower extremity edema   Acute on chronic diastolic heart failure ([place])   #1 anasarca/acute on chronic diastolic CHF exacerbation -patient reports he was taking all his medications as prescribed and was monitoring his diet except during COVID he had to get fluid food from outside. Followed at [facility] by [facility] cardiologist Dr. Christa See. He has been on IV Lasix since admission. He is negative by 9.9L since admission. His creatinineis stable  with  diuresis. Echocardiogram this admission shows normal ejection fraction with grade 1 diastolic dysfunction. Cardiology starting torsemide 60 mg twice a day.  Prior to admission he was on torsemide 40 mg twice a day at home.  Lasix has been stopped. -Doppler of the lower extremities- no dvt -Right heart cath 07/25/2018 per cardiology filling pressures look optimized. -TED hose ordered. -Patient to get a dose of ambrisentan today -Follow-up with CHF clinic upon discharge - will need sleep study as an outpatient  #2 CKD stage III creatininemonitor on IV diuresis creatinine stable.  Hyperkalemic today will give Kayexalate patient is also on Aldactone will defer to cardiology  #3 chronic venous insufficiency with bilateral lower extremity edema worse than baseline. Improving with diuresis  #4 history of pulmonary fibrosis O2 dependent 2 L 24/7   #5 hypertension stable on meds  #6 GERD with Barrett's esophagus continue PPI  #7 hyperlipidemia on statins  #8 hyperkalemia patient is on Aldactone.  Will treat with Kayexalate recheck levels tomorrow.  DVT prophylaxis:Lovenox Code Status:Full code Family Communication:None patient did not wanted me to speak to anyone else he lives alone in an assisted living facility. Disposition Plan:Possible discharge tomorrow if he remains stable consults called:Cardiology   Estimated body mass index  is 32.14 kg/m as calculated from the following:   Height as of this encounter:  _0  (1.702 m).   Weight as of this encounter: 93.1 kg.   Subjective:  Seen on his way down to heart cath feels the nose is blocked and not able to breathe through the nose Objective: Vitals:   07/25/18 0802 07/25/18 0807 07/25/18 0855 07/25/18 0904  BP: (!) 143/93 (!) 149/99 99/72 107/70  Pulse: 92 81 84 90  Resp: 14 (!) 0 16   Temp:   97.9 F (36.6 C)   TempSrc:   Oral   SpO2: 100% (!) 0% 92%   Weight:      Height:        Intake/Output Summary (Last 24 hours) at 07/25/2018 1113 Last data filed at 07/25/2018 0908 Gross per 24 hour  Intake 1208.58 ml  Output 2190 ml  Net -981.42 ml   Filed Weights   07/23/18 0358 07/24/18 0406 07/25/18 0515  Weight: 95.5 kg 94.4 kg 93.1 kg    Examination:  General exam: Appears calm and comfortable  Respiratory system: Diminished breath sounds at the bases to auscultation. Respiratory effort normal. Cardiovascular system: S1 & S2 heard, RRR. No JVD, murmurs, rubs, gallops or clicks. No pedal edema. Gastrointestinal system: Abdomen is nondistended, soft and nontender. No organomegaly or masses felt. Normal bowel sounds heard. Central nervous system: Alert and oriented. No focal neurological deficits. Extremities: 1+ pitting edema decreasing Skin: No rashes, lesions or ulcers Psychiatry: Judgement and insight appear normal. Mood & affect appropriate.     Data Reviewed: I have personally reviewed following labs and imaging studies  CBC: Recent Labs  Lab 07/19/18 1452 07/20/18 0408 07/21/18 0607 07/25/18 0801  WBC 8.9 8.2 7.9  --   NEUTROABS  --  6.0 5.6  --   HGB 15.0 15.5 15.2 15.6  HCT 49.5 50.9 49.3 46.0  MCV 95.0 95.0 94.3  --   PLT 231 258 220  --    Basic Metabolic Panel: Recent Labs  Lab 07/21/18 0607 07/22/18 0436 07/23/18 0845 07/24/18 0536 07/25/18 0801 07/25/18 0843  NA 136 138 135 137 136 135  K 4.3 4.2 4.7 4.2 3.9 5.9*  CL 95* 95* 95* 96*  --  95*  CO2 30 35* 31 33*  --  27  GLUCOSE 125* 115* 151*  114*  --  86  BUN 27* 25* 25* 25*  --  29*  CREATININE 1.54* 1.57* 1.44* 1.49*  --  1.49*  CALCIUM 8.4* 8.2* 8.3* 8.2*  --  8.2*   GFR: Estimated Creatinine Clearance: 40.1 mL/min (A) (by C-G formula based on SCr of 1.49 mg/dL (H)). Liver Function Tests: Recent Labs  Lab 07/21/18 0607  AST 25  ALT 27  ALKPHOS 83  BILITOT 0.7  PROT 6.1*  ALBUMIN 3.3*   No results for input(s): LIPASE, AMYLASE in the last 168 hours. No results for input(s): AMMONIA in the last 168 hours. Coagulation Profile: No results for input(s): INR, PROTIME in the last 168 hours. Cardiac Enzymes: Recent Labs  Lab 07/19/18 1452  TROPONINI <0.03   BNP (last 3 results) No results for input(s): PROBNP in the last 8760 hours. HbA1C: No results for input(s): HGBA1C in the last 72 hours. CBG: No results for input(s): GLUCAP in the last 168 hours. Lipid Profile: No results for input(s): CHOL, HDL, LDLCALC, TRIG, CHOLHDL, LDLDIRECT in the last 72 hours. Thyroid Function Tests: No results for input(s): TSH, T4TOTAL, FREET4, T3FREE, THYROIDAB in the last  72 hours. Anemia Panel: No results for input(s): VITAMINB12, FOLATE, FERRITIN, TIBC, IRON, RETICCTPCT in the last 72 hours. Sepsis Labs: No results for input(s): PROCALCITON, LATICACIDVEN in the last 168 hours.  Recent Results (from the past 240 hour(s))  Novel Coronavirus,NAA,(SEND-OUT TO REF LAB - TAT 24-48 hrs); Hosp Order     Status: None   Collection Time: 07/19/18  5:50 PM   Specimen: Nasopharyngeal Swab; Respiratory  Result Value Ref Range Status   SARS-CoV-2, NAA NOT DETECTED NOT DETECTED Final    Comment: (NOTE) This test was developed and its performance characteristics determined by [employer]. This test has not been FDA cleared or approved. This test has been authorized by FDA under an Emergency Use Authorization (EUA). This test is only authorized for the duration of time the declaration that circumstances exist justifying the  authorization of the emergency use of in vitro diagnostic tests for detection of SARS-CoV-2 virus and/or diagnosis of COVID-19 infection under section 564(b)(1) of the Act, 21 U.S.C. 612AES-9(P)(5), unless the authorization is terminated or revoked sooner. When diagnostic testing is negative, the possibility of a false negative result should be considered in the context of a patient's recent exposures and the presence of clinical signs and symptoms consistent with COVID-19. An individual without symptoms of COVID-19 and who is not shedding SARS-CoV-2 virus would expect to have a negative (not detected) result in this assay. Performed  At: [facility] [address] [place], [place] [id number] Rush Farmer MD RZ:[id number]    [place]  Final    Comment: Performed at [facility], [place] [address], [place], [place] [address]         Radiology Studies: No results found.      Scheduled Meds: . allopurinol  50 mg Oral Daily  . ambrisentan  5 mg Oral Daily  . enoxaparin (LOVENOX) injection  40 mg Subcutaneous Q24H  . finasteride  5 mg Oral Daily  . FLUoxetine  10 mg Oral Daily  . fluticasone  1 spray Each Nare Daily  . nystatin  5 mL Mouth/Throat QID  . pantoprazole  40 mg Oral Daily  . potassium chloride SA  20 mEq Oral BID  . pramipexole  0.75 mg Oral QHS  . sodium chloride flush  3 mL Intravenous Q12H  . sodium chloride flush  3 mL Intravenous Q12H  . spironolactone  25 mg Oral Daily  . torsemide  60 mg Oral BID  . traZODone  50 mg Oral QHS   Continuous Infusions: . sodium chloride       LOS: 6 days   Georgette Shell, MD [facility]  If 7PM-7AM, please contact night-coverage [email or web address] Password [email or web address] 07/25/2018, 11:13 AM

## 2018-07-25 NOTE — Plan of Care (Signed)
  Problem: Education: Goal: Knowledge of General Education information will improve Description: Including pain rating scale, medication(s)/side effects and non-pharmacologic comfort measures Outcome: Progressing   Problem: Health Behavior/Discharge Planning: Goal: Ability to manage health-related needs will improve Outcome: Progressing   Problem: Clinical Measurements: Goal: Ability to maintain clinical measurements within normal limits will improve Outcome: Progressing Goal: Will remain free from infection Outcome: Progressing Goal: Diagnostic test results will improve Outcome: Progressing Goal: Respiratory complications will improve Outcome: Progressing Goal: Cardiovascular complication will be avoided Outcome: Progressing   Problem: Nutrition: Goal: Adequate nutrition will be maintained Outcome: Progressing   Problem: Elimination: Goal: Will not experience complications related to bowel motility Outcome: Progressing Goal: Will not experience complications related to urinary retention Outcome: Progressing   Problem: Pain Managment: Goal: General experience of comfort will improve Outcome: Progressing   Problem: Safety: Goal: Ability to remain free from injury will improve Outcome: Progressing   Problem: Skin Integrity: Goal: Risk for impaired skin integrity will decrease Outcome: Progressing   Problem: Education: Goal: Ability to demonstrate management of disease process will improve Outcome: Progressing Goal: Ability to verbalize understanding of medication therapies will improve Outcome: Progressing Goal: Individualized Educational Video(s) Outcome: Progressing   Problem: Activity: Goal: Capacity to carry out activities will improve Outcome: Progressing   Problem: Cardiac: Goal: Ability to achieve and maintain adequate cardiopulmonary perfusion will improve Outcome: Progressing

## 2018-07-26 DIAGNOSIS — E875 Hyperkalemia: Secondary | ICD-10-CM | POA: Diagnosis present

## 2018-07-26 LAB — BASIC METABOLIC PANEL
Anion gap: 12 (ref 5–15)
BUN: 34 mg/dL — ABNORMAL HIGH (ref 8–23)
CO2: 35 mmol/L — ABNORMAL HIGH (ref 22–32)
Calcium: 8.3 mg/dL — ABNORMAL LOW (ref 8.9–10.3)
Chloride: 90 mmol/L — ABNORMAL LOW (ref 98–111)
Creatinine, Ser: 1.89 mg/dL — ABNORMAL HIGH (ref 0.61–1.24)
GFR calc Af Amer: 37 mL/min — ABNORMAL LOW (ref >60)
GFR calc non Af Amer: 32 mL/min — ABNORMAL LOW (ref >60)
Glucose, Bld: 107 mg/dL — ABNORMAL HIGH (ref 70–99)
Potassium: 3.4 mmol/L — ABNORMAL LOW (ref 3.5–5.1)
Sodium: 137 mmol/L (ref 135–145)

## 2018-07-26 MED ORDER — POTASSIUM CHLORIDE CRYS ER 20 MEQ PO TBCR
40.0000 meq | EXTENDED_RELEASE_TABLET | Freq: Once | ORAL | Status: AC
  Administered 2018-07-26: 40 meq via ORAL
  Filled 2018-07-26: qty 2

## 2018-07-26 MED ORDER — POTASSIUM CHLORIDE CRYS ER 20 MEQ PO TBCR: 20.0000 meq | EXTENDED_RELEASE_TABLET | Freq: Every day | ORAL | 0 refills | Status: DC

## 2018-07-26 MED ORDER — SPIRONOLACTONE 25 MG PO TABS: 12.5000 mg | ORAL_TABLET | Freq: Every day | ORAL | 0 refills | Status: DC

## 2018-07-26 MED ORDER — TORSEMIDE 20 MG PO TABS: 60.0000 mg | ORAL_TABLET | Freq: Every day | ORAL | Status: DC

## 2018-07-26 MED ORDER — AMBRISENTAN 5 MG PO TABS: 5.0000 mg | ORAL_TABLET | Freq: Every day | ORAL | 0 refills | Status: DC

## 2018-07-26 MED ORDER — TORSEMIDE 20 MG PO TABS: 20.0000 mg | ORAL_TABLET | Freq: Two times a day (BID) | ORAL | 14 refills | Status: DC

## 2018-07-26 MED ORDER — TORSEMIDE 20 MG PO TABS: 40.0000 mg | ORAL_TABLET | Freq: Every evening | ORAL | Status: DC

## 2018-07-26 MED ORDER — SPIRONOLACTONE 12.5 MG HALF TABLET
12.5000 mg | ORAL_TABLET | Freq: Every day | ORAL | Status: DC
  Administered 2018-07-26: 12.5 mg via ORAL
  Filled 2018-07-26: qty 1

## 2018-07-26 NOTE — TOC Transition Note (Addendum)
Transition of Care [facility]) - CM/SW Discharge Note   Patient Details  Name: Ryan [name] MRN: [id number] Date of Birth: 1932-01-27  Transition of Care [facility]) CM/SW Contact:  Zenon Mayo, RN Phone Number: 07/26/2018, 11:17 AM   Clinical Narrative:    He is from [place], he has services with [facility].  NCM contacted [place] and left message for Otila Kluver to call NCM back regarding [place].   He has home oxgyen also but not sure of the company name.  He is separated from wife but has given NCM permission to speak with her.  She will be transporting him home today. He has no problems with getting medications.  NCM spoke with wife, she states she could not remember the name of the oxygen company but he only lives 10 mins away and that he will be fine without it til he gets to [place], NCM informed her that we could get oxygen tank for him but she states he will be ok til he gets home.  12:30 Tomi Bamberger RN, BSN- NCM spoke with Otila Kluver at [facility], she states that they use [facility] for Nursing for the patient's.  NCM made referral to [place] at [facility] for [facility], she was able to take referral.       Final next level of care: Home w Home Health Services Barriers to Discharge: No Barriers Identified   Patient Goals and CMS Choice Patient states their goals for this hospitalization and ongoing recovery are:: to stay away from salt, keep breathing CMS Medicare.gov Compare Post Acute Care list provided to:: Patient Choice offered to / list presented to : Patient  Discharge Placement                       Discharge Plan and Services In-house Referral: NA Discharge Planning Services: CM Consult Post Acute Care Choice: Home Health          DME Arranged: (NA)         HH Arranged: RN [place] Agency: Other - See comment([facility]) Date [place]: 07/26/18 Time [place]: 56 Representative spoke with at [place]: [place] ([place]) Interventions     Readmission Risk Interventions Readmission Risk Prevention Plan 07/26/2018  Transportation Screening Complete  PCP or Specialist Appt within 3-5 Days Complete  HRI or [place] Complete  Social Work Consult for [place] Planning/Counseling Complete  Palliative Care Screening Not Applicable  Medication Review Press photographer) Complete  Some recent data might be hidden

## 2018-07-26 NOTE — Care Management (Signed)
07-26-18 CM did speak with patient's wife regarding oxygen for travel home. Patient has oxygen at [facility] and wife states that patient will be perfectly fine for travel home without oxygen. CM did offer to call DME agency to get a tank delivered to hospital. Wife and patient unable to state the name of DME agency. Wife states patient has a ten minute ride back to facility and he will be fine. No further needs from CM at this time. Bethena Roys, RN,BSN Case Manager [phone]

## 2018-07-26 NOTE — TOC Initial Note (Signed)
Transition of Care [facility]) - Initial/Assessment Note    Patient Details  Name: Ryan [name] MRN: [id number] Date of Birth: [birth year]  Transition of Care [facility]) CM/SW Contact:    Zenon Mayo, RN Phone Number: 07/26/2018, 11:13 AM  Clinical Narrative:                 He is from [place], he has services with [facility]. He has home oxgyen also but not sure of the company name.  He is separated from wife but has given NCM permission to speak with her.  She will be transporting him home today. He has no problems with getting medications.  Expected Discharge Plan: [place] Barriers to Discharge: No Barriers Identified   Patient Goals and CMS Choice Patient states their goals for this hospitalization and ongoing recovery are:: stat awat from salt, keep breathing CMS Medicare.gov Compare Post Acute Care list provided to:: Patient Choice offered to / list presented to : Patient  Expected Discharge Plan and Services Expected Discharge Plan: [place] In-house Referral: NA Discharge Planning Services: CM Consult Post Acute Care Choice: [place] arrangements for the past 2 months: [place] Expected Discharge Date: 07/26/18               DME Arranged: (NA)         HH Arranged: RN HH Agency: Other - See comment(he has services with [facility].) Date [place]: 07/26/18 Time [place]: 1112 Representative spoke with at [place]: [place] rep  Prior Living Arrangements/Services Living arrangements for the past 2 months: [place] Lives with:: Self Patient language and need for interpreter reviewed:: Yes Do you feel safe going back to the place where you live?: Yes      Need for Family Participation in Patient Care: Yes (Comment) Care giver support system in place?: Yes (comment) Current home services: Other (comment)([facility]  services) Criminal Activity/Legal Involvement Pertinent to Current Situation/Hospitalization: No - Comment as needed  Activities of Daily Living Home Assistive Devices/Equipment: Gilford Rile (specify type) ADL Screening (condition at time of admission) Patient's cognitive ability adequate to safely complete daily activities?: Yes Is the patient deaf or have difficulty hearing?: Yes Does the patient have difficulty seeing, even when wearing glasses/contacts?: No Does the patient have difficulty concentrating, remembering, or making decisions?: Yes Patient able to express need for assistance with ADLs?: Yes Does the patient have difficulty dressing or bathing?: No Independently performs ADLs?: No Communication: Independent Does the patient have difficulty walking or climbing stairs?: Yes Weakness of Legs: Both Weakness of Arms/Hands: None  Permission Sought/Granted Permission sought to share information with : Family Supports, Chartered certified accountant granted to share information with : Yes, Verbal Permission Granted              Emotional Assessment Appearance:: Appears stated age Attitude/Demeanor/Rapport: Engaged Affect (typically observed): Appropriate Orientation: : Oriented to Self, Oriented to Place, Oriented to  Time, Oriented to Situation Alcohol / Substance Use: Alcohol Use Psych Involvement: No (comment)  Admission diagnosis:  Urinary retention [R33.9] Peripheral edema [R60.9] Patient Active Problem List   Diagnosis Date Noted  . Hyperkalemia 07/26/2018  . Acute on chronic diastolic heart failure ([place])   . Bilateral lower extremity edema 02/02/2017  . Pulmonary fibrosis ([place]) 11/22/2016  . Blurry vision, bilateral 10/26/2016  . COPD ([place]) 07/05/2016  . Exertional dyspnea 09/10/2015  . Fatigue/malaise  08/19/2015  . Asthma/COPD 02/12/2015  . 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 Hypoxemia 01/05/2014  . Essential hypertension 01/05/2014   . Hypokalemia 01/05/2014  . Urinary retention 01/05/2014  . Hypoxia   . Lumbar radiculopathy 12/31/2013  . RLS (restless legs syndrome) 09/21/2012  . Chronic lower back pain 06/15/2012  . Abdominal pain, unspecified site 05/19/2012  . Unspecified deficiency anemia 04/07/2012  . Melanoma ([place])   . Non Hodgkin's lymphoma ([place])   . Periaortic lymphadenopathy 02/16/2011  . Barrett's esophagus 07/09/2010  . Personal history of colonic polyps 05/28/2010  . History of IBS 05/28/2010  . Diverticulosis of colon (without mention of hemorrhage) 05/28/2010  . Arthritis involving multiple sites 05/28/2010  . Wheat intolerance 05/28/2010  . Chronic venous insufficiency 08/19/2009  . PEDAL EDEMA 08/19/2009  . INSOMNIA, CHRONIC 12/24/2008  . EUSTACHIAN TUBE DYSFUNCTION, BILATERAL 12/23/2008  . ERECTILE DYSFUNCTION, ORGANIC 08/01/2008  . Allergic rhinitis with a nonallergic component 05/20/2008  . VENTRAL HERNIA 02/12/2008  . PALPITATIONS 12/25/2007  . LACTOSE INTOLERANCE 10/17/2007  . Hyperlipidemia 09/14/2006  . HYPERTENSION 09/14/2006  . BPH (benign prostatic hyperplasia) 09/14/2006  . OSTEOARTHRITIS 09/14/2006  . HEART MURMUR, HX OF 09/14/2006   PCP:  Chesley Noon, MD Pharmacy:   [facility], [place] [facility] [place] [address] Phone: [phone] Fax: [place], [place] - [address] [place] [address] Phone: [phone] Fax: [phone]     Social Determinants of Health (SDOH) Interventions    Readmission Risk Interventions Readmission Risk Prevention Plan 07/26/2018  Transportation Screening Complete  PCP or Specialist Appt within 3-5 Days Complete  HRI or [place] Complete  Social Work Consult for [place] Planning/Counseling Complete  Palliative Care Screening Not Applicable   Medication Review Press photographer) Complete  Some recent data might be hidden

## 2018-07-26 NOTE — Discharge Instructions (Signed)
Heart Failure Heart failure is a condition in which the heart has trouble pumping blood because it has become weak or stiff. This means that the heart does not pump blood efficiently for the body to work well. For some people with heart failure, fluid may back up into the lungs and there may be swelling (edema) in the lower legs. Heart failure is usually a long-term (chronic) condition. It is important for you to take good care of yourself and follow the treatment plan from your health care provider. What are the causes? This condition is caused by some health problems, including:  High blood pressure (hypertension). Hypertension causes the heart muscle to work harder than normal. High blood pressure eventually causes the heart to become stiff and weak.  Coronary artery disease (CAD). CAD is the buildup of cholesterol and fat (plaques) in the arteries of the heart.  Heart attack (myocardial infarction). Injured tissue, which is caused by the heart attack, does not contract as well and the heart's ability to pump blood is weakened.  Abnormal heart valves. When the heart valves do not open and close properly, the heart muscle must pump harder to keep the blood flowing.  Heart muscle disease (cardiomyopathy or myocarditis). Heart muscle disease is damage to the heart muscle from a variety of causes, such as drug or alcohol abuse, infections, or unknown causes. These can increase the risk of heart failure.  Lung disease. When the lungs do not work properly, the heart must work harder. What increases the risk? Risk of heart failure increases as a person ages. This condition is also more likely to develop in people who:  Are overweight.  Are male.  Smoke or chew tobacco.  Abuse alcohol or illegal drugs.  Have taken medicines that can damage the heart, such as chemotherapy drugs.  Have diabetes. ? High blood sugar (glucose) is associated with high fat (lipid) levels in the blood. ? Diabetes  can also damage tiny blood vessels that carry nutrients to the heart muscle.  Have abnormal heart rhythms.  Have thyroid problems.  Have low blood counts (anemia). What are the signs or symptoms? Symptoms of this condition include:  Shortness of breath with activity, such as when climbing stairs.  Persistent cough.  Swelling of the feet, ankles, legs, or abdomen.  Unexplained weight gain.  Difficulty breathing when lying flat (orthopnea).  Waking from sleep because of the need to sit up and get more air.  Rapid heartbeat.  Fatigue and loss of energy.  Feeling light-headed, dizzy, or close to fainting.  Loss of appetite.  Nausea.  Increased urination during the night (nocturia).  Confusion. How is this diagnosed? This condition is diagnosed based on:  Medical history, symptoms, and a physical exam.  Diagnostic tests, which may include: ? Echocardiogram. ? Electrocardiogram (ECG). ? Chest X-ray. ? Blood tests. ? Exercise stress test. ? Radionuclide scans. ? Cardiac catheterization and angiogram. How is this treated? Treatment for this condition is aimed at managing the symptoms of heart failure. Medicines, behavioral changes, or other treatments may be necessary to treat heart failure. Medicines These may include:  Angiotensin-converting enzyme (ACE) inhibitors. This type of medicine blocks the effects of a blood protein called angiotensin-converting enzyme. ACE inhibitors relax (dilate) the blood vessels and help to lower blood pressure.  Angiotensin receptor blockers (ARBs). This type of medicine blocks the actions of a blood protein called angiotensin. ARBs dilate the blood vessels and help to lower blood pressure.  Water pills (diuretics). Diuretics cause  the kidneys to remove salt and water from the blood. The extra fluid is removed through urination, leaving a lower volume of blood that the heart has to pump.  Beta blockers. These improve heart muscle  strength and they prevent the heart from beating too quickly.  Digoxin. This increases the force of the heartbeat. Healthy behavior changes These may include:  Reaching and maintaining a healthy weight.  Stopping smoking or chewing tobacco.  Eating heart-healthy foods.  Limiting or avoiding alcohol.  Stopping use of street drugs (illegal drugs).  Physical activity. Other treatments These may include:  Surgery to open blocked coronary arteries or repair damaged heart valves.  Placement of a biventricular pacemaker to improve heart muscle function (cardiac resynchronization therapy). This device paces both the right ventricle and left ventricle.  Placement of a device to treat serious abnormal heart rhythms (implantable cardioverter defibrillator, or ICD).  Placement of a device to improve the pumping ability of the heart (left ventricular assist device, or LVAD).  Heart transplant. This can cure heart failure, and it is considered for certain patients who do not improve with other therapies. Follow these instructions at home: Medicines  Take over-the-counter and prescription medicines only as told by your health care provider. Medicines are important in reducing the workload of your heart, slowing the progression of heart failure, and improving your symptoms. ? Do not stop taking your medicine unless your health care provider told you to do that. ? Do not skip any dose of medicine. ? Refill your prescriptions before you run out of medicine. You need your medicines every day. Eating and drinking   Eat heart-healthy foods. Talk with a dietitian to make an eating plan that is right for you. ? Choose foods that contain no trans fat and are low in saturated fat and cholesterol. Healthy choices include fresh or frozen fruits and vegetables, fish, lean meats, legumes, fat-free or low-fat dairy products, and whole-grain or high-fiber foods. ? Limit salt (sodium) if directed by your  health care provider. Sodium restriction may reduce symptoms of heart failure. Ask a dietitian to recommend heart-healthy seasonings. ? Use healthy cooking methods instead of frying. Healthy methods include roasting, grilling, broiling, baking, poaching, steaming, and stir-frying.  Limit your fluid intake if directed by your health care provider. Fluid restriction may reduce symptoms of heart failure. Lifestyle   Stop smoking or using chewing tobacco. Nicotine and tobacco can damage your heart and your blood vessels. Do not use nicotine gum or patches before talking to your health care provider.  Limit alcohol intake to no more than 1 drink per day for non-pregnant women and 2 drinks per day for men. One drink equals 12 oz of beer, 5 oz of wine, or 1 oz of hard liquor. ? Drinking more than that is harmful to your heart. Tell your health care provider if you drink alcohol several times a week. ? Talk with your health care provider about whether any level of alcohol use is safe for you. ? If your heart has already been damaged by alcohol or you have severe heart failure, drinking alcohol should be stopped completely.  Stop use of illegal drugs.  Lose weight if directed by your health care provider. Weight loss may reduce symptoms of heart failure.  Do moderate physical activity if directed by your health care provider. People who are elderly and people with severe heart failure should consult with a health care provider for physical activity recommendations. Monitor important information   Weigh  yourself every day. Keeping track of your weight daily helps you to notice excess fluid sooner. ? Weigh yourself every morning after you urinate and before you eat breakfast. ? Wear the same amount of clothing each time you weigh yourself. ? Record your daily weight. Provide your health care provider with your weight record.  Monitor and record your blood pressure as told by your health care  provider.  Check your pulse as told by your health care provider. Dealing with extreme temperatures  If the weather is extremely hot: ? Avoid vigorous physical activity. ? Use air conditioning or fans or seek a cooler location. ? Avoid caffeine and alcohol. ? Wear loose-fitting, lightweight, and light-colored clothing.  If the weather is extremely cold: ? Avoid vigorous physical activity. ? Layer your clothes. ? Wear mittens or gloves, a hat, and a scarf when you go outside. ? Avoid alcohol. General instructions  Manage other health conditions such as hypertension, diabetes, thyroid disease, or abnormal heart rhythms as told by your health care provider.  Learn to manage stress. If you need help to do this, ask your health care provider.  Plan rest periods when fatigued.  Get ongoing education and support as needed.  Participate in or seek rehabilitation as needed to maintain or improve independence and quality of life.  Stay up to date with immunizations. Keeping current on pneumococcal and influenza immunizations is especially important to prevent respiratory infections.  Keep all follow-up visits as told by your health care provider. This is important. Contact a health care provider if:  You have a rapid weight gain.  You have increasing shortness of breath that is unusual for you.  You are unable to participate in your usual physical activities.  You tire easily.  You cough more than normal, especially with physical activity.  You have any swelling or more swelling in areas such as your hands, feet, ankles, or abdomen.  You are unable to sleep because it is hard to breathe.  You feel like your heart is beating quickly (palpitations).  You become dizzy or light-headed when you stand up. Get help right away if:  You have difficulty breathing.  You notice or your family notices a change in your awareness, such as having trouble staying awake or having difficulty  with concentration.  You have pain or discomfort in your chest.  You have an episode of fainting (syncope). This information is not intended to replace advice given to you by your health care provider. Make sure you discuss any questions you have with your health care provider. Document Released: 01/18/2005 Document Revised: 12/17/2016 Document Reviewed: 08/13/2015 Elsevier Interactive Patient Education  [employer].

## 2018-07-26 NOTE — Discharge Summary (Signed)
Physician Discharge Summary  Ryan [name] GYI:[id number] DOB: August 23, 1931 DOA: 07/19/2018  PCP: Chesley Noon, MD  Admit date: 07/19/2018 Discharge date: 07/26/2018  Admitted From: Home (independent living) Disposition: Home  Recommendations for Outpatient Follow-up:  1. Follow up with heart failure clinic in 2 weeks.  Check be met in 1 week. 2. Please arrange for outpatient sleep study.  Home Health: RN Equipment/Devices: Chronic 2 L nasal cannula on ambulation and at night  Discharge Condition: Fair CODE STATUS: Full code Diet recommendation: Heart healthy   Discharge Diagnoses:  Principal problem Acute on chronic diastolic CHF ([place])  Active Problems:   Hyperlipidemia   BPH (benign prostatic hyperplasia)   Chronic venous insufficiency   PEDAL EDEMA   Barrett's esophagus   Non Hodgkin's lymphoma ([place])   Essential hypertension Interstitial lung disease   Bilateral lower extremity edema   Hyperkalemia  Brief Narrative/history of presenting illness Please refer to admission H&P for details, in brief, 85 year old male with history of diastolic CHF, chronic lower extremity edema with venous insufficiency, BPH, hypertension, interstitial lung disease on chronic home O2 (2 L at night), history of lymphoma and hyperlipidemia, BPH presented to the ED with worsening leg swelling.  Patient was found to have anasarca.  Patient follows with cardiologist at [facility]. Patient was hospitalized and placed on IV diuresis.  Tested negative for COVID-19.  Hospital course Acute on chronic diastolic CHF ([place]) Diuresed with IV Lasix.  Negative balance of almost 11 L since admission.  Suspect this is due to RV failure from pulmonary hypertension.  2D echo with EF of 60-65% PASP of 59 mmHg with filling pressure appearing optimized on right heart catheterization done on 6/23.  RHC showed moderate pulmonary artery hypertension compared to prior. Ambrisentan resumed.  Serological work-up  sent. Diuretic adjusted and patient will be discharged on torsemide 60 mg in a.m. and 40 mg p.m. (was on 40 mg twice daily at home). Aldactone dose reduced to 12.5 mg daily due to hyperkalemia.   Patient can be discharged back to assisted living.  He will follow-up in the CHF clinic here in [place] as he has difficulty in going to [place]. He will follow-up in the CHF clinic in 2 weeks.  Home health RN arranged.  Needs labs in 1 week. Recommend sleep study as outpatient.  Pulmonary artery hypertension As outlined above.  Chronic kidney disease stage III Stable with diuresis.  History of pulmonary fibrosis On 2 L O2.  Essential hypertension Stable.  GERD with Barrett's esophagus Continue PPI  Hyperlipidemia Continue statin.  Hyperkalemia Aldactone was held and given Kayexalate.  Low K this morning.  Resumed home dose potassium (will reduce dose to 20 mEq daily).  Reduce Aldactone dose to 12.5 mg daily.   Consults: Heart failure (Dr. Aundra Dubin) Procedure: Right heart catheterization Family communication: None at bedside Disposition: Return to independent living.  Discharge Instructions   Allergies as of 07/26/2018      Reactions   Pollen Extract-tree Extract Other (See Comments)   HEADACHES, TIRED , DRAINAGE FROM SINUSES   Molds & Smuts Other (See Comments)   Also dust mites causes sinus infections, h/a etc.      Medication List    TAKE these medications   allopurinol 100 MG tablet Commonly known as: ZYLOPRIM Take 50 mg by mouth daily.   ambrisentan 5 MG tablet Commonly known as: LETAIRIS Take 1 tablet (5 mg total) by mouth daily. Start taking on: July 27, 2018   finasteride 5 MG tablet Commonly  known as: PROSCAR Take 1 tablet (5 mg total) by mouth daily.   FLUoxetine 10 MG capsule Commonly known as: PROZAC Take 10 mg by mouth daily.   nystatin 100000 UNIT/ML suspension Commonly known as: MYCOSTATIN Use as directed 5 mLs in the mouth or throat 4 (four)  times daily. Swish and swallow   omeprazole 20 MG capsule Commonly known as: PRILOSEC Take 20 mg by mouth daily.   potassium chloride SA 20 MEQ tablet Commonly known as: K-DUR Take 1 tablet (20 mEq total) by mouth daily. What changed: when to take this   pramipexole 0.75 MG tablet Commonly known as: MIRAPEX Take 0.75 mg by mouth at bedtime.   spironolactone 25 MG tablet Commonly known as: ALDACTONE Take 0.5 tablets (12.5 mg total) by mouth daily. What changed: how much to take   torsemide 20 MG tablet Commonly known as: DEMADEX Take 1 tablet (20 mg total) by mouth 2 (two) times a day. Take 60 mg (3 tablets) in the morning  and 40 mg ( 2 tablets) in the evening. What changed:   how much to take  additional instructions   traMADol 50 MG tablet Commonly known as: ULTRAM Take 50 mg by mouth as needed for moderate pain.   traZODone 100 MG tablet Commonly known as: DESYREL Take 100 mg by mouth at bedtime.   triazolam 0.25 MG tablet Commonly known as: HALCION Take 1 tablet by mouth at bedtime as needed for sleep.      Follow-up Information    Larey Dresser, MD. Go on 08/10/2018.   Specialty: Cardiology Why: 9:20 AM, parking code [address] information: [place] [address] [phone]        Lorretta Harp, MD .   Specialties: Cardiology, Radiology Contact information: [address] [place] [address] [place] [address] [phone]          Allergies  Allergen Reactions  . Pollen Extract-Tree Extract Other (See Comments)    HEADACHES, TIRED , DRAINAGE FROM SINUSES  . Molds & Smuts Other (See Comments)    Also dust mites causes sinus infections, h/a etc.      Procedures/Studies: Dg Chest 2 View  Result Date: 07/19/2018 CLINICAL DATA:  Swelling in legs. EXAM: CHEST - 2 VIEW COMPARISON:  March 08, 2018 FINDINGS: There is mild opacity in left base, likely atelectasis. There is a large hiatal hernia behind the left side of  the heart. Stable cardiomediastinal silhouette. No other acute abnormalities. IMPRESSION: Large hiatal hernia. Mild atelectasis adjacent to the left hemidiaphragm. Electronically Signed   By: Dorise Bullion III M.D   On: 07/19/2018 16:02   Vas [place] Lower Extremity Venous (dvt)  Result Date: 07/21/2018  Lower Venous Study Indications: Edema.  Limitations: Body habitus and poor ultrasound/tissue interface. Performing Technologist: Abram Sander RVS  Examination Guidelines: A complete evaluation includes B-mode imaging, spectral Doppler, color Doppler, and power Doppler as needed of all accessible portions of each vessel. Bilateral testing is considered an integral part of a complete examination. Limited examinations for reoccurring indications may be performed as noted.  +---------+---------------+---------+-----------+----------+--------------+ RIGHT    CompressibilityPhasicitySpontaneityPropertiesSummary        +---------+---------------+---------+-----------+----------+--------------+ CFV      Full           Yes      Yes                                 +---------+---------------+---------+-----------+----------+--------------+  SFJ      Full                                                        +---------+---------------+---------+-----------+----------+--------------+ FV Prox  Full                                                        +---------+---------------+---------+-----------+----------+--------------+ FV Mid   Full                                                        +---------+---------------+---------+-----------+----------+--------------+ FV DistalFull                                                        +---------+---------------+---------+-----------+----------+--------------+ PFV      Full                                                        +---------+---------------+---------+-----------+----------+--------------+ POP      Full            Yes      Yes                                 +---------+---------------+---------+-----------+----------+--------------+ PTV                                                   Not visualized +---------+---------------+---------+-----------+----------+--------------+ PERO                                                  Not visualized +---------+---------------+---------+-----------+----------+--------------+   +---------+---------------+---------+-----------+----------+--------------+ LEFT     CompressibilityPhasicitySpontaneityPropertiesSummary        +---------+---------------+---------+-----------+----------+--------------+ CFV      Full           Yes      Yes                                 +---------+---------------+---------+-----------+----------+--------------+ SFJ      Full                                                        +---------+---------------+---------+-----------+----------+--------------+  FV Prox  Full                                                        +---------+---------------+---------+-----------+----------+--------------+ FV Mid   Full                                                        +---------+---------------+---------+-----------+----------+--------------+ FV DistalFull                                                        +---------+---------------+---------+-----------+----------+--------------+ PFV      Full                                                        +---------+---------------+---------+-----------+----------+--------------+ POP      Full           Yes      Yes                                 +---------+---------------+---------+-----------+----------+--------------+ PTV                                                   Not visualized +---------+---------------+---------+-----------+----------+--------------+ PERO                                                  Not  visualized +---------+---------------+---------+-----------+----------+--------------+     Summary: Right: There is no evidence of deep vein thrombosis in the lower extremity. However, portions of this examination were limited- see technologist comments above. No cystic structure found in the popliteal fossa. Left: There is no evidence of deep vein thrombosis in the lower extremity. However, portions of this examination were limited- see technologist comments above. No cystic structure found in the popliteal fossa.  *See table(s) above for measurements and observations. Electronically signed by Monica Martinez MD on 07/21/2018 at 4:50:07 PM.    Final        Subjective: Patient reports that his breathing is much better today and leg swellings have gone down.  Discharge Exam: Vitals:   07/26/18 0404 07/26/18 0848  BP: 115/67 (!) 96/55  Pulse: 83 (!) 56  Resp: 20 20  Temp: 98.4 F (36.9 C)   SpO2: 96% 93%   Vitals:   07/25/18 2100 07/26/18 0404 07/26/18 0700 07/26/18 0848  BP: 112/76 115/67  (!) 96/55  Pulse: 82 83  (!) 56  Resp: _0 Temp: 98.5 F (36.9 C) 98.4 F (36.9 C)  TempSrc: Oral Oral    SpO2: 97% 96%  93%  Weight:   91.8 kg   Height:        General: Elderly male not in distress HEENT: Moist mucosa, supple neck Chest: Clear bilaterally CVS: Normal S1 and S2, no murmurs GI: Soft, nondistended, nontender Musculoskeletal: Warm, trace pedal edema bilaterally    The results of significant diagnostics from this hospitalization (including imaging, microbiology, ancillary and laboratory) are listed below for reference.     Microbiology: Recent Results (from the past 240 hour(s))  Novel Coronavirus,NAA,(SEND-OUT TO REF LAB - TAT 24-48 hrs); Hosp Order     Status: None   Collection Time: 07/19/18  5:50 PM   Specimen: Nasopharyngeal Swab; Respiratory  Result Value Ref Range Status   SARS-CoV-2, NAA NOT DETECTED NOT DETECTED Final    Comment: (NOTE) This test  was developed and its performance characteristics determined by [employer]. This test has not been FDA cleared or approved. This test has been authorized by FDA under an Emergency Use Authorization (EUA). This test is only authorized for the duration of time the declaration that circumstances exist justifying the authorization of the emergency use of in vitro diagnostic tests for detection of SARS-CoV-2 virus and/or diagnosis of COVID-19 infection under section 564(b)(1) of the Act, 21 U.S.C. 977SFS-2(L)(9), unless the authorization is terminated or revoked sooner. When diagnostic testing is negative, the possibility of a false negative result should be considered in the context of a patient's recent exposures and the presence of clinical signs and symptoms consistent with COVID-19. An individual without symptoms of COVID-19 and who is not shedding SARS-CoV-2 virus would expect to have a negative (not detected) result in this assay. Performed  At: [facility] [address] [place], [place] [id number] Rush Farmer MD HW:[id number]    [place]  Final    Comment: Performed at [facility], [place] [address], [place], [place] [address]     Labs: BNP (last 3 results) Recent Labs    03/08/18 1859 07/19/18 1750  BNP 40.3 52.0   Basic Metabolic Panel: Recent Labs  Lab 07/23/18 0845 07/24/18 0536 07/25/18 0801 07/25/18 0843 07/25/18 1420 07/26/18 0530  NA 135 137 136  136 135 137 137  K 4.7 4.2 3.8  3.9 5.9* 3.6 3.4*  CL 95* 96*  --  95* 93* 90*  CO2 31 33*  --  27 33* 35*  GLUCOSE 151* 114*  --  86 189* 107*  BUN 25* 25*  --  29* 32* 34*  CREATININE 1.44* 1.49*  --  1.49* 1.95* 1.89*  CALCIUM 8.3* 8.2*  --  8.2* 8.6* 8.3*   Liver Function Tests: Recent Labs  Lab 07/21/18 0607  AST 25  ALT 27  ALKPHOS 83  BILITOT 0.7  PROT 6.1*  ALBUMIN 3.3*   No results for input(s): LIPASE, AMYLASE in the last 168 hours. No  results for input(s): AMMONIA in the last 168 hours. CBC: Recent Labs  Lab 07/19/18 1452 07/20/18 0408 07/21/18 0607 07/25/18 0801  WBC 8.9 8.2 7.9  --   NEUTROABS  --  6.0 5.6  --   HGB 15.0 15.5 15.2 15.6  15.6  HCT 49.5 50.9 49.3 46.0  46.0  MCV 95.0 95.0 94.3  --   PLT 231 258 220  --    Cardiac Enzymes: Recent Labs  Lab 07/19/18 1452  TROPONINI <0.03   BNP: Invalid input(s): POCBNP CBG: No results for input(s): GLUCAP in the last  168 hours. D-Dimer No results for input(s): DDIMER in the last 72 hours. Hgb A1c No results for input(s): HGBA1C in the last 72 hours. Lipid Profile No results for input(s): CHOL, HDL, LDLCALC, TRIG, CHOLHDL, LDLDIRECT in the last 72 hours. Thyroid function studies No results for input(s): TSH, T4TOTAL, T3FREE, THYROIDAB in the last 72 hours.  Invalid input(s): FREET3 Anemia work up No results for input(s): VITAMINB12, FOLATE, FERRITIN, TIBC, IRON, RETICCTPCT in the last 72 hours. Urinalysis    Component Value Date/Time   COLORURINE STRAW (A) 07/19/2018 2030   APPEARANCEUR CLEAR 07/19/2018 2030   LABSPEC 1.008 07/19/2018 2030   LABSPEC 1.025 06/28/2013 1321   PHURINE 7.0 07/19/2018 2030   GLUCOSEU NEGATIVE 07/19/2018 2030   GLUCOSEU Negative 06/28/2013 1321   HGBUR MODERATE (A) 07/19/2018 2030   [place] NEGATIVE 07/19/2018 2030   BILIRUBINUR Negative 06/28/2013 1321   KETONESUR NEGATIVE 07/19/2018 2030   PROTEINUR NEGATIVE 07/19/2018 2030   UROBILINOGEN 1.0 01/04/2014 2156   UROBILINOGEN 0.2 06/28/2013 1321   NITRITE NEGATIVE 07/19/2018 2030   LEUKOCYTESUR NEGATIVE 07/19/2018 2030   LEUKOCYTESUR Negative 06/28/2013 1321   Sepsis Labs Invalid input(s): PROCALCITONIN,  WBC,  LACTICIDVEN Microbiology Recent Results (from the past 240 hour(s))  Novel Coronavirus,NAA,(SEND-OUT TO REF LAB - TAT 24-48 hrs); Hosp Order     Status: None   Collection Time: 07/19/18  5:50 PM   Specimen: Nasopharyngeal Swab; Respiratory  Result  Value Ref Range Status   SARS-CoV-2, NAA NOT DETECTED NOT DETECTED Final    Comment: (NOTE) This test was developed and its performance characteristics determined by [employer]. This test has not been FDA cleared or approved. This test has been authorized by FDA under an Emergency Use Authorization (EUA). This test is only authorized for the duration of time the declaration that circumstances exist justifying the authorization of the emergency use of in vitro diagnostic tests for detection of SARS-CoV-2 virus and/or diagnosis of COVID-19 infection under section 564(b)(1) of the Act, 21 U.S.C. 973ZHG-9(J)(2), unless the authorization is terminated or revoked sooner. When diagnostic testing is negative, the possibility of a false negative result should be considered in the context of a patient's recent exposures and the presence of clinical signs and symptoms consistent with COVID-19. An individual without symptoms of COVID-19 and who is not shedding SARS-CoV-2 virus would expect to have a negative (not detected) result in this assay. Performed  At: [facility] [address] [place], [place] [id number] Rush Farmer MD QI:[id number]    [place]  Final    Comment: Performed at [facility], [place] [address], [place], [place] [address]     Time coordinating discharge: 35 minutes  SIGNED:   Louellen Molder, MD  [facility] 07/26/2018, 10:31 AM Pager   If 7PM-7AM, please contact night-coverage [email or web address] Password [email or web address]

## 2018-07-26 NOTE — TOC Transition Note (Signed)
Transition of Care [facility]) - CM/SW Discharge Note   Patient Details  Name: Ryan [name] MRN: [id number] Date of Birth: 20-Feb-1931  Transition of Care [facility]) CM/SW Contact:  Zenon Mayo, RN Phone Number: 07/26/2018, 12:36 PM   Clinical Narrative:     He is from [place] [facility], he has services with [facility].  NCM contacted [facility] and left message for Otila Kluver to call NCM back regarding [place].   He has home oxgyen also but not sure of the company name.  He is separated from wife but has given NCM permission to speak with her.  She will be transporting him home today. He has no problems with getting medications.  NCM spoke with wife, she states she could not remember the name of the oxygen company but he only lives 10 mins away and that he will be fine without it til he gets to [place], NCM informed her that we could get oxygen tank for him but she states he will be ok til he gets home.  12:30 Tomi Bamberger RN, BSN- NCM spoke with Otila Kluver at [place], she states that they use [facility] for the patient's.  NCM made referral to [place] at [facility] for [facility], she was able to take referral.      Final next level of care: Home w Home Health Services Barriers to Discharge: No Barriers Identified   Patient Goals and CMS Choice Patient states their goals for this hospitalization and ongoing recovery are:: to stay away from [place] , keep breathing CMS Medicare.gov Compare Post Acute Care list provided to:: Patient(facility) Choice offered to / list presented to : Patient  Discharge Placement                       Discharge Plan and Services In-house Referral: NA Discharge Planning Services: CM Consult Post Acute Care Choice: Home Health          DME Arranged: (NA)         HH Arranged: RN [place] Agency: [facility] (formerly [employer]) Date [place]: 07/26/18 Time [place]: 81 Representative spoke with at [place]:  [place] ([place]) Interventions     Readmission Risk Interventions Readmission Risk Prevention Plan 07/26/2018  Transportation Screening Complete  PCP or Specialist Appt within 3-5 Days Complete  HRI or [place] Complete  Social Work Consult for [place] Planning/Counseling [place] Not Applicable  Medication Review Press photographer) Complete  Some recent data might be hidden

## 2018-07-26 NOTE — Progress Notes (Addendum)
Patient ID: Ryan [name], male   DOB: 04-17-1931, 85 y.o.   MRN: [id number]     Advanced Heart Failure Rounding Note  PCP-Cardiologist: [name], MD   Subjective:    Breathing well this morning, no complaints, weight down a total of about 17 lbs.  Spironolactone and torsemide on hold with elevated K, K and creatinine both lower today.    RF, ANA, anti-SCL 70, anti-centromere Ab negative  RHC Procedural Findings: Hemodynamics (mmHg) RA mean 7 RV 47/9 PA 50/24, mean 34 PCWP mean 12 Oxygen saturations: PA 65% AO 97% Cardiac Output (Fick) 4.11  Cardiac Index (Fick) 2.01 PVR 5.35 WU PAPI 3.7   Objective:   Weight Range: 91.8 kg Body mass index is 31.68 kg/m.   Vital Signs:   Temp:  [97.9 F (36.6 C)-98.5 F (36.9 C)] 98.4 F (36.9 C) (06/24 0404) Pulse Rate:  [82-90] 83 (06/24 0404) Resp:  [16-20] 20 (06/24 0404) BP: (99-117)/(67-85) 115/67 (06/24 0404) SpO2:  [92 %-97 %] 96 % (06/24 0404) Weight:  [91.8 kg] 91.8 kg (06/24 0700) Last BM Date: 07/24/18  Weight change: Filed Weights   07/24/18 0406 07/25/18 0515 07/26/18 0700  Weight: 94.4 kg 93.1 kg 91.8 kg    Intake/Output:   Intake/Output Summary (Last 24 hours) at 07/26/2018 2111 Last data filed at 07/26/2018 0400 Gross per 24 hour  Intake 720 ml  Output 1650 ml  Net -930 ml      Physical Exam    General: NAD Neck: No JVD, no thyromegaly or thyroid nodule.  Lungs: Clear to auscultation bilaterally with normal respiratory effort. CV: Nondisplaced PMI.  Heart regular S1/S2, no S3/S4, no murmur.  No peripheral edema.  Abdomen: Soft, nontender, no hepatosplenomegaly, no distention.  Skin: Intact without lesions or rashes.  Neurologic: Alert and oriented x 3.  Psych: Normal affect. Extremities: No clubbing or cyanosis.  HEENT: Normal.    Telemetry   NSR with occasional PACs and PVCs (personally reviewed)  Labs    CBC Recent Labs    07/25/18 0801  HGB 15.6  15.6  HCT 46.0  55.2   Basic Metabolic Panel Recent Labs    07/25/18 1420 07/26/18 0530  NA 137 137  K 3.6 3.4*  CL 93* 90*  CO2 33* 35*  GLUCOSE 189* 107*  BUN 32* 34*  CREATININE 1.95* 1.89*  CALCIUM 8.6* 8.3*   Liver Function Tests No results for input(s): AST, ALT, ALKPHOS, BILITOT, PROT, ALBUMIN in the last 72 hours. No results for input(s): LIPASE, AMYLASE in the last 72 hours. Cardiac Enzymes No results for input(s): CKTOTAL, CKMB, CKMBINDEX, TROPONINI in the last 72 hours.  BNP: BNP (last 3 results) Recent Labs    03/08/18 1859 07/19/18 1750  BNP 40.3 36.1    ProBNP (last 3 results) No results for input(s): PROBNP in the last 8760 hours.   D-Dimer No results for input(s): DDIMER in the last 72 hours. Hemoglobin A1C No results for input(s): HGBA1C in the last 72 hours. Fasting Lipid Panel No results for input(s): CHOL, HDL, LDLCALC, TRIG, CHOLHDL, LDLDIRECT in the last 72 hours. Thyroid Function Tests No results for input(s): TSH, T4TOTAL, T3FREE, THYROIDAB in the last 72 hours.  Invalid input(s): FREET3  Other results:   Imaging    No results found.   Medications:     Scheduled Medications: . allopurinol  50 mg Oral Daily  . ambrisentan  5 mg Oral Daily  . enoxaparin (LOVENOX) injection  40 mg Subcutaneous Q24H  .  finasteride  5 mg Oral Daily  . FLUoxetine  10 mg Oral Daily  . fluticasone  1 spray Each Nare Daily  . nystatin  5 mL Mouth/Throat QID  . pantoprazole  40 mg Oral Daily  . potassium chloride  40 mEq Oral Once  . pramipexole  0.75 mg Oral QHS  . sodium chloride flush  3 mL Intravenous Q12H  . sodium chloride flush  3 mL Intravenous Q12H  . spironolactone  25 mg Oral Daily  . traZODone  50 mg Oral QHS    Infusions: . sodium chloride      PRN Medications: sodium chloride, acetaminophen, magnesium hydroxide, ondansetron (ZOFRAN) IV, sodium chloride, sodium chloride flush, traMADol, triazolam    Assessment/Plan   1. Acute on chronic  diastolic CHF: Suspect with significant component of RV failure from pulmonary hypertension.  Echo with EF 60-65%, RV not well-visualized but PASP 59 mmHg.  RHC yesterday showed filling pressures optimized. Torsemide held yesterday pm with rise in creatinine, creatinine down to 1.89 today. Volume status ok on exam today.  - He was on torsemide 40 mg bid at home.  Will send him home on torsemide 60 qam/40 qpm.  Will hold this morning's dose and start this evening.  - With elevation in K yesterday, decrease spironolactone to 12.5 mg daily.  Suspect rise was due to combination of K replacement and spironolactone.  - Ted hose.  2. AKI on CKD Stage 3:  Creatinine elevated yesterday but trending down.  As above, hold morning torsemide dose.  3. Pulmonary hypertnesion: Patient thought to have [place] out of proportion to his lung disease (ILD, NSIP versus UIP). RHC in 2/20 showed mean PA pressure 41, PVR 5.1 WU, CI 3. Echo this admission with RV reportedly ok => I reviewed and do not think that RV was well-visualized.  Patient did not tolerate Adcirca due to edema/?cognitive effects.  RHC was done, showed moderate pulmonary arterial hypertension with PVR 5.35 (comparable to prior).  - He has ambrisentan at home, think he can start this at 5 mg daily => started yesterday, tolerating so far. Can continue at home.  - Sent serological workup as cannot see where this was done => negative ANA, RF, SCL70, anti-centromere.  - had remote sleep study, likely should have repeat => can do home sleep study.   4. H/o PVCs: Occasional on telemetry.   5. Disposition: Should be ready for home today from my standpoint. Should get BMET next week.  We will see him in followup in 2 wks or so in the CHF clinic.  Cardiac meds for home: torsemide 60 qam/40 qpm (start with pm dose today), spironolactone 12.5 daily, Letairis 5 mg daily (should have at home already).   Length of Stay: 7  Loralie Champagne, MD  07/26/2018, 8:22 AM  Advanced  Heart Failure Team Pager [phone] (M-F; 7a - 4p)  Please contact [facility] for night-coverage after hours (4p -7a ) and weekends on [email or web address]

## 2018-07-29 ENCOUNTER — Telehealth: Payer: Self-pay | Admitting: Medical

## 2018-07-29 NOTE — Telephone Encounter (Signed)
Patient called the answering service with complaints of weight gain. He reported weight being up 3.5lbs from yesterday. He states he had a chinese style meal for dinner last night in his dining hall. No complaints of chest pain, SOB, or LE edema. He took his morning medications including torsemide 38m just prior to my call. He reports compliance with his diuretics since discharge 07/26/2018. Patient instructed to take an extra 282mof torsemide at this time, then resume normal 40103mPM and 65m71mM dosing. Instructed patient to call our office back if he does not notice improvement in his weight tomorrow morning. Encouraged avoidance of salty foods. Patient in agreement with plan.   KrisAbigail Butts-C 07/29/18; 10:46 AM

## 2018-07-30 ENCOUNTER — Telehealth: Payer: Self-pay | Admitting: Medical

## 2018-07-30 DIAGNOSIS — I5033 Acute on chronic diastolic (congestive) heart failure: Secondary | ICD-10-CM

## 2018-07-30 NOTE — Telephone Encounter (Signed)
Patient called the answering service again with complaints of continued weight gain. He reports weights over the past 3 days are 206.4lb (6/26) > 209.8lb (6/27) > 211.0lb (6/28). Notably, weight was 201.9lbs on discharge 6/22. He has LE edema, though this is significantly improved from his recent admission. No complaints of SOB or chest pain. He increased his torsemide to 71m yesterday morning and resumed 40 mg qPM and 60 mg this morning. He states he is trying to limit his salt intake. We discussed the importance of keeping Na intake under 2g per day and fluids <2L per day. Will increase his torsemide to 869mqAM and 6028mPM x3 days, then resume 60/40 dosing. Will check BMET 6/30 for close monitoring of his kidney function. He is scheduled to see Advanced Heart Failure 08/10/2018. Patient in agreement with plan and will contact the office again if weight gain continues or new symptoms arise.   [name] 07/30/18; 1:28 PM

## 2018-08-01 ENCOUNTER — Telehealth (HOSPITAL_COMMUNITY): Payer: Self-pay

## 2018-08-01 NOTE — Telephone Encounter (Signed)
Received call on triage line stating that he has had no weight gain over night and that he had lab work done at dentist. Hulen Skains pt back to check in on pt. No answer "line busy".

## 2018-08-02 NOTE — Telephone Encounter (Signed)
Received call from [employer] RN to make [place] aware that pt weight has gone up from hospital to 211lbs on Sunday. On Sunday the pt called an on call provider and the pt was instructed to take 164m of Torsemide every morning and 868mof Torsemide every evening. The patient has been taking that dosage since Sunday. Went to PCP on Tuesday and had lab work drawn.This RN called his PCP and received his labs and last office note. Spoke with pt to see how he was feeling. Pt states he "feels great" but has swelling in his legs and feet but it isnt as bad it was prior to admission. Reviewed by Amy NP-C. Per Amy pt is to continue on Torsemide 10079mAM and 75m66mM until follow up visit 7/9.

## 2018-08-08 ENCOUNTER — Other Ambulatory Visit (HOSPITAL_COMMUNITY): Payer: Self-pay | Admitting: Cardiology

## 2018-08-10 ENCOUNTER — Encounter (HOSPITAL_COMMUNITY): Payer: Medicare HMO | Admitting: Cardiology

## 2018-08-11 ENCOUNTER — Other Ambulatory Visit: Payer: Self-pay

## 2018-08-11 ENCOUNTER — Ambulatory Visit (HOSPITAL_COMMUNITY)
Admission: RE | Admit: 2018-08-11 | Discharge: 2018-08-11 | Disposition: A | Discharge status: Home / Self Care | Payer: Medicare HMO | Source: Ambulatory Visit | Attending: Cardiology | Admitting: Cardiology

## 2018-08-11 VITALS — BP 124/86 | HR 98 | Wt 219.2 lb

## 2018-08-11 DIAGNOSIS — Z923 Personal history of irradiation: Secondary | ICD-10-CM | POA: Insufficient documentation

## 2018-08-11 DIAGNOSIS — I130 Hypertensive heart and chronic kidney disease with heart failure and stage 1 through stage 4 chronic kidney disease, or unspecified chronic kidney disease: Secondary | ICD-10-CM | POA: Diagnosis not present

## 2018-08-11 DIAGNOSIS — Z8249 Family history of ischemic heart disease and other diseases of the circulatory system: Secondary | ICD-10-CM | POA: Diagnosis not present

## 2018-08-11 DIAGNOSIS — K219 Gastro-esophageal reflux disease without esophagitis: Secondary | ICD-10-CM | POA: Diagnosis not present

## 2018-08-11 DIAGNOSIS — Z9981 Dependence on supplemental oxygen: Secondary | ICD-10-CM | POA: Diagnosis not present

## 2018-08-11 DIAGNOSIS — Z8572 Personal history of non-Hodgkin lymphomas: Secondary | ICD-10-CM | POA: Insufficient documentation

## 2018-08-11 DIAGNOSIS — I5032 Chronic diastolic (congestive) heart failure: Secondary | ICD-10-CM | POA: Insufficient documentation

## 2018-08-11 DIAGNOSIS — I272 Pulmonary hypertension, unspecified: Secondary | ICD-10-CM | POA: Diagnosis not present

## 2018-08-11 DIAGNOSIS — Z87891 Personal history of nicotine dependence: Secondary | ICD-10-CM | POA: Insufficient documentation

## 2018-08-11 DIAGNOSIS — N183 Chronic kidney disease, stage 3 (moderate): Secondary | ICD-10-CM | POA: Insufficient documentation

## 2018-08-11 DIAGNOSIS — R0683 Snoring: Secondary | ICD-10-CM

## 2018-08-11 DIAGNOSIS — Z79899 Other long term (current) drug therapy: Secondary | ICD-10-CM | POA: Insufficient documentation

## 2018-08-11 DIAGNOSIS — J8410 Pulmonary fibrosis, unspecified: Secondary | ICD-10-CM | POA: Insufficient documentation

## 2018-08-11 LAB — BASIC METABOLIC PANEL
Anion gap: 10 (ref 5–15)
BUN: 33 mg/dL — ABNORMAL HIGH (ref 8–23)
CO2: 33 mmol/L — ABNORMAL HIGH (ref 22–32)
Calcium: 8.3 mg/dL — ABNORMAL LOW (ref 8.9–10.3)
Chloride: 95 mmol/L — ABNORMAL LOW (ref 98–111)
Creatinine, Ser: 1.43 mg/dL — ABNORMAL HIGH (ref 0.61–1.24)
GFR calc Af Amer: 52 mL/min — ABNORMAL LOW (ref >60)
GFR calc non Af Amer: 45 mL/min — ABNORMAL LOW (ref >60)
Glucose, Bld: 185 mg/dL — ABNORMAL HIGH (ref 70–99)
Potassium: 4.1 mmol/L (ref 3.5–5.1)
Sodium: 138 mmol/L (ref 135–145)

## 2018-08-11 MED ORDER — METOLAZONE 2.5 MG PO TABS: ORAL_TABLET | ORAL | 6 refills | Status: DC

## 2018-08-11 MED ORDER — POTASSIUM CHLORIDE CRYS ER 20 MEQ PO TBCR: 20.0000 meq | EXTENDED_RELEASE_TABLET | Freq: Every day | ORAL | 6 refills | Status: DC

## 2018-08-11 NOTE — Progress Notes (Signed)
Called [employer] as patient requesting face mask for oxygen.  They will contact exwife to arrange delivery.

## 2018-08-11 NOTE — Progress Notes (Signed)
Patient Name: Ryan [name]        DOB: Apr 17, 2031      Height: 219 lb    Weight:  Office Name: Advanced Heart Failure         Referring Provider: Loralie Champagne MD  Today's Date: 08/11/18  Date:   STOP BANG RISK ASSESSMENT S (snore) Have you been told that you snore?     NO   T (tired) Are you often tired, fatigued, or sleepy during the day?   YES  O (obstruction) Do you stop breathing, choke, or gasp during sleep? NO   P (pressure) Do you have or are you being treated for high blood pressure? YES   B (BMI) Is your body index greater than 35 kg/m? NO   A (age) Are you 83 years old or older? NO   N (neck) Do you have a neck circumference greater than 16 inches?   NA   G (gender) Are you a male? YES   TOTAL STOP/BANG "YES" ANSWERS 3                                                                       For Office Use Only              Procedure Order Form    YES to 3+ Stop Bang questions OR two clinical symptoms - patient qualifies for WatchPAT (CPT 95800)     Submit: This Form + Patient Face Sheet + Clinical Note via CloudPAT or Fax: [phone]         Clinical Notes: Will consult Sleep Specialist and refer for management of therapy due to patient increased risk of Sleep Apnea. Ordering a sleep study due to the following two clinical symptoms: Excessive daytime sleepiness G47.10 / Gastroesophageal reflux K21.9 / Nocturia R35.1 / Morning Headaches G44.221 / Difficulty concentrating R41.840 / Memory problems or poor judgment G31.84 / Personality changes or irritability R45.4 / Loud snoring R06.83 / Depression F32.9 / Unrefreshed by sleep G47.8 / Impotence N52.9 / History of high blood pressure R03.0 / Insomnia G47.00    I understand that I am proceeding with a home sleep apnea test as ordered by my treating physician. I understand that untreated sleep apnea is a serious cardiovascular risk factor and it is my responsibility to perform the test and seek management for sleep apnea.  I will be contacted with the results and be managed for sleep apnea by a local sleep physician. I will be receiving equipment and further instructions from [facility]. I shall promptly ship back the equipment via the included mailing label. I understand my insurance will be billed for the test and as the patient I am responsible for any insurance related out-of-pocket costs incurred. I have been provided with written instructions and can call for additional video or telephonic instruction, with 24-hour availability of qualified personnel to answer any questions: Patient Help Desk [phone].  Patient Signature ______________________________________________________   Date______________________ Patient Telemedicine Verbal Consent

## 2018-08-11 NOTE — Patient Instructions (Addendum)
TAKE Metolazone 2.69m (1 tab) Saturday 08/12/18 and Sunday 08/13/18 with morning Torsemide THEN every Saturday going forward  TAKE Potassium 40 (2 tabs) on the days you take Metolazone (THIS WEEKEND and EVERY Saturday after that)  STOP Ambersitan/Letairis  Your physician requested that you have a VQ scan of your lungs. You will get a phone call to schedule this appointment.   Your provider has recommended that you have a home sleep study.  [employer]rumble[employer] the company that provides these and will send the equipment right to your home with instructions on how to set it up.  Once you have completed the test you just dispose of the equipment, the information is automatically uploaded to [employer]  IF you have any questions or issues with the equipment please call the company.  If your test was positive and you need a home CPAP machine you will be contacted by Dr TTheodosia Blenderoffice ([facility] to set this up.  Your physician recommends that you schedule a follow-up appointment in: 1 week with Dr MAundra Dubin

## 2018-08-14 NOTE — Progress Notes (Signed)
PCP: Chesley Noon, MD Cardiology: Dr. Aundra Dubin  85 yo with history of interstitial lung disease (?NSIP vs UIP) on home oxygen, pulmonary hypertension, diastolic CHF, and prior non-Hodgkins lymphoma (pelvic mass, s/p XRT) presents for followup of CHF and pulmonary hypertension.  Patient has history of reportedly mild ILD (NSIP vs UIP) followed by a pulmonologist at [facility] and pulmonary hypertension thought to be out of proportion lung disease followed by a physician at [facility].  Last CT chest was in 11/19, showing stable ILD (?UIP vs NSIP).  He had RHC in 2/20 with moderate PAH, PVR 5.1 WU.  He was tried on Adcirca but developed cognitive problems and worsening of peripheral edema so this was stopped.  He has been on home oxygen, uses at night and with exertion.   He was admitted in 6/20 with worsening dyspnea and peripheral edema, weight up despite compliance with torsemide 40 mg bid at home.  He has CKD stage 3 and torsemide was decreased in the spring. He was diuresed aggressively with fall in weight.  Echo showed normal LV size and systolic function, RV reportedly normal.  6/20 RHC showed normal filling pressures with moderate pulmonary hypertension, PVR 5.35 WU.   Patient had ambrisentan waiting for him when he got home from the hospital, now taking 5 mg daily.  Since discharge from hospital, his weight has steadily increased.  He is up about 12 lbs.  Worsening LE edema.  He is now using oxygen all the time.  He is short of breath walking around his house and orthopneic. He has significant chronic low back pain.  He sleeps frequently during the day and snores.  He is actually taking more torsemide than we had discharged him on, 100 qam/80 qpm.   Labs (6/20): K 4.7, creatinine 1.46  PMH: 1. Non-Hodgkin's lymphoma: Pelvic involvement.  S/p radiation.  Thought to be in remission (Dr. Benay Spice).  2. HTN 3. PACs/PVCs 4. GERD: Barrett's esophagus.  5. 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  - High  resolution CT chest 11/19: ILD, ?UIP pattern.  6. Pulmonary hypertension: Thought to be out of proportion to underlying lung disease (ILD, ?NSIP vs UIP).   - RHC (2/20): mean RA 10, PASP 60 with mean 42, mean PCWP 11, CI 3, PVR 5.1 WU.  - Trial of Adcirca => failed due to ?memory worsening/cognitive affects and peripheral edema.  - Echo (6/20): EF 60-65%, mild LVH, RV reportedly normal, PASP 59 mmHg.  - RHC (6/20): mean RA 7, PA 50/24 mean 34, mean PCWP 12, CI 2.01, PAPI 3.7, PVR 5.35 WU - RF negative, ANA negative, anti-SCL70 negative, anti-centromere negative.  7. CKD: Stage 3.   Social History   Socioeconomic History  . Marital status: Married    Spouse name: Not on file  . Number of children: 2  . Years of education: Not on file  . Highest education level: Not on file  Occupational History  . 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 Financial resource strain: Not on file  . Food insecurity    Worry: Not on file    Inability: Not on file  . Transportation needs    Medical: Not on file    Non-medical: Not on file  Tobacco Use  . Smoking status: Former Smoker    Packs/day: 1.00    Years: 35.00    Pack years: 35.00    Types: Pipe, Cigarettes    Quit date: 02/23/1992  Years since quitting: 26.4  . Smokeless tobacco: Never Used  . Tobacco comment: quit 20 years ago  Substance and Sexual Activity  . Alcohol use: Yes    Comment: 2 drinks daily scotch  AND WINE WITH SUPPER  . Drug use: No  . Sexual activity: Not on file  Lifestyle  . Physical activity    Days per week: Not on file    Minutes per session: Not on file  . Stress: Not on file  Relationships  . Social Herbalist on phone: Not on file    Gets together: Not on file    Attends religious service: Not on file    Active member of club or organization: Not on file    Attends meetings of clubs or organizations: Not on file    Relationship status: Not on file  .  Intimate partner violence    Fear of current or ex partner: Not on file    Emotionally abused: Not on file    Physically abused: Not on file    Forced sexual activity: Not on file  Other Topics Concern  . Not on file  Social History Narrative   Married - second marriage   He has two children   Retired Horticulturist, commercial Professor   Currently teaches pottery making, 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[facility]   Former Smoker quit 12 years ago- smoked for 35 years   Alcohol use- yes   Family History  Problem Relation Age of Onset  . Heart disease Father        MI 81  . Urticaria Father   . Prostate cancer Brother   . Prostate cancer Paternal Uncle   . Prostate cancer Paternal Uncle   . Prostate cancer Paternal Uncle   . Hyperlipidemia Other   . Stroke Other   . Hypertension Other   . ADD / ADHD Other   . Colon cancer Neg Hx   . Allergic rhinitis Neg Hx   . Asthma Neg Hx   . Eczema Neg Hx    ROS: All systems reviewed and negative except as per HPI.   Current Outpatient Medications  Medication Sig Dispense Refill  . allopurinol (ZYLOPRIM) 100 MG tablet Take 50 mg by mouth daily.    . finasteride (PROSCAR) 5 MG tablet Take 1 tablet (5 mg total) by mouth daily. 30 tablet 5  . FLUoxetine (PROZAC) 10 MG capsule Take 10 mg by mouth daily.    Marland Kitchen LORazepam (ATIVAN) 0.5 MG tablet     . nystatin (MYCOSTATIN) 100000 UNIT/ML suspension Use as directed 5 mLs in the mouth or throat 4 (four) times daily. Swish and swallow    . omeprazole (PRILOSEC) 20 MG capsule Take 20 mg by mouth daily.    . potassium chloride SA (K-DUR) 20 MEQ tablet Take 1 tablet (20 mEq total) by mouth daily. And take 40 meq on Saturdays with Metolazone 40 tablet 6  . pramipexole (MIRAPEX) 0.75 MG tablet Take 0.75 mg by mouth at bedtime.    . RESTASIS 0.05 % ophthalmic emulsion     . spironolactone (ALDACTONE) 25 MG tablet Take 0.5 tablets (12.5 mg total) by mouth daily. 10 tablet 0  . torsemide (DEMADEX) 20 MG tablet Take 5 tabs in  the morning and 4 tabs in the evening    . traMADol (ULTRAM) 50 MG tablet Take 50 mg by mouth as needed for moderate pain.     . traZODone (DESYREL) 100 MG tablet Take  100 mg by mouth at bedtime.    . triazolam (HALCION) 0.25 MG tablet Take 1 tablet by mouth at bedtime as needed for sleep.     . metolazone (ZAROXOLYN) 2.5 MG tablet Take 1 tablet every Saturday and as directed by Heart Failure clinic 12 tablet 6   No current facility-administered medications for this encounter.    BP 124/86   Pulse 98   Wt 99.4 kg (219 lb 3.2 oz)   SpO2 94% Comment: currently on 2L of oxygen  BMI 34.33 kg/m  General: NAD Neck: JVP 10-12 cm with HJR, no thyromegaly or thyroid nodule.  Lungs: Clear to auscultation bilaterally with normal respiratory effort. CV: Nondisplaced PMI.  Heart regular S1/S2, no S3/S4, 2/6 SEM RUSB.  1+ edema to knees bilaterally.  No carotid bruit.  Difficult to palpate pedal pulses with edema.   Abdomen: Soft, nontender, no hepatosplenomegaly, no distention.  Skin: Intact without lesions or rashes.  Neurologic: Alert and oriented x 3.  Psych: Normal affect. Extremities: No clubbing or cyanosis.  HEENT: Normal.   Assessment/Plan: 1. Chronic diastolic CHF: Suspect with significant component of RV failure from pulmonary hypertension. Echo in 6/20 with EF 60-65%, RV not well-visualized but PASP 59 mmHg.  RHC in 6/20 showed filling pressures optimized, but since hospital discharge he had gained significant weight.  He is very volume overloaded on exam.  - Continue torsemide 100 qam/80 qpm.  - I will have him take metolazone 2.5 mg on Saturday and Sunday morning this week (x 2), after that, he will take metolazone 2.5 once weekly.  Increase KCl to 40 daily on metolazone days. BMET today and in 10 days.  - Continue spironolactone 12.5 daily.  2. CKD Stage 3: BMET today.  3. Pulmonary hypertnesion: Patient thought to have [place] out of proportion to his lung disease (ILD, NSIP versus  UIP). RHC in 2/20 showed mean PA pressure 41, PVR 5.1 WU, CI 3. Echo in 6/20 with RV reportedly ok =>I reviewed and do not think that RV was well-visualized. Patient did not tolerate Adcirca due to edema/?cognitive effects.  RHC was done again in 6/20, showed moderate pulmonary arterial hypertension with PVR 5.35 (comparable to prior).  Serological workup for PH was negative.  Patient is now on Letairis 5 mg and has developed significant edema/volume overload despite a high dose of torsemide.  I am concerned that ambrisentan caused the peripheral edema/volume overload.  - Stop ambrisentan as I am concerned that it may have helped trigger peripheral edema/CHF.  - V/Q scan to rule out chronic PEs.   - Arrange for home sleep study.  - When he is better diuresed, will need to consider Uptravi (intolerant of Adcirca, Letairis).   Work in with me in 1 week for followup of CHF.   Loralie Champagne 08/14/2018

## 2018-08-15 ENCOUNTER — Telehealth (HOSPITAL_COMMUNITY): Payer: Self-pay

## 2018-08-15 NOTE — Telephone Encounter (Signed)
Order, OV note, stop bang and demographics all faxed to [facility] via epic

## 2018-08-18 ENCOUNTER — Other Ambulatory Visit: Payer: Self-pay

## 2018-08-18 ENCOUNTER — Ambulatory Visit (HOSPITAL_COMMUNITY)
Admission: RE | Admit: 2018-08-18 | Discharge: 2018-08-18 | Disposition: A | Discharge status: Home / Self Care | Payer: Medicare HMO | Source: Ambulatory Visit | Attending: Cardiology | Admitting: Cardiology

## 2018-08-18 ENCOUNTER — Encounter (HOSPITAL_COMMUNITY): Payer: Self-pay | Admitting: Cardiology

## 2018-08-18 VITALS — BP 132/82 | HR 70 | Wt 215.2 lb

## 2018-08-18 DIAGNOSIS — Z79899 Other long term (current) drug therapy: Secondary | ICD-10-CM | POA: Insufficient documentation

## 2018-08-18 DIAGNOSIS — Z9981 Dependence on supplemental oxygen: Secondary | ICD-10-CM | POA: Insufficient documentation

## 2018-08-18 DIAGNOSIS — Z87891 Personal history of nicotine dependence: Secondary | ICD-10-CM | POA: Insufficient documentation

## 2018-08-18 DIAGNOSIS — I493 Ventricular premature depolarization: Secondary | ICD-10-CM | POA: Diagnosis not present

## 2018-08-18 DIAGNOSIS — I4510 Unspecified right bundle-branch block: Secondary | ICD-10-CM | POA: Diagnosis not present

## 2018-08-18 DIAGNOSIS — Z8042 Family history of malignant neoplasm of prostate: Secondary | ICD-10-CM | POA: Insufficient documentation

## 2018-08-18 DIAGNOSIS — Z8572 Personal history of non-Hodgkin lymphomas: Secondary | ICD-10-CM | POA: Insufficient documentation

## 2018-08-18 DIAGNOSIS — I272 Pulmonary hypertension, unspecified: Secondary | ICD-10-CM | POA: Diagnosis not present

## 2018-08-18 DIAGNOSIS — Z8249 Family history of ischemic heart disease and other diseases of the circulatory system: Secondary | ICD-10-CM | POA: Diagnosis not present

## 2018-08-18 DIAGNOSIS — I491 Atrial premature depolarization: Secondary | ICD-10-CM | POA: Diagnosis not present

## 2018-08-18 DIAGNOSIS — R9431 Abnormal electrocardiogram [ECG] [EKG]: Secondary | ICD-10-CM | POA: Insufficient documentation

## 2018-08-18 DIAGNOSIS — N183 Chronic kidney disease, stage 3 (moderate): Secondary | ICD-10-CM | POA: Diagnosis not present

## 2018-08-18 DIAGNOSIS — K219 Gastro-esophageal reflux disease without esophagitis: Secondary | ICD-10-CM | POA: Diagnosis not present

## 2018-08-18 DIAGNOSIS — I130 Hypertensive heart and chronic kidney disease with heart failure and stage 1 through stage 4 chronic kidney disease, or unspecified chronic kidney disease: Secondary | ICD-10-CM | POA: Diagnosis present

## 2018-08-18 DIAGNOSIS — I5032 Chronic diastolic (congestive) heart failure: Secondary | ICD-10-CM

## 2018-08-18 LAB — BASIC METABOLIC PANEL
Anion gap: 12 (ref 5–15)
BUN: 53 mg/dL — ABNORMAL HIGH (ref 8–23)
CO2: 38 mmol/L — ABNORMAL HIGH (ref 22–32)
Calcium: 8.5 mg/dL — ABNORMAL LOW (ref 8.9–10.3)
Chloride: 86 mmol/L — ABNORMAL LOW (ref 98–111)
Creatinine, Ser: 1.67 mg/dL — ABNORMAL HIGH (ref 0.61–1.24)
GFR calc Af Amer: 43 mL/min — ABNORMAL LOW (ref >60)
GFR calc non Af Amer: 37 mL/min — ABNORMAL LOW (ref >60)
Glucose, Bld: 140 mg/dL — ABNORMAL HIGH (ref 70–99)
Potassium: 3.1 mmol/L — ABNORMAL LOW (ref 3.5–5.1)
Sodium: 136 mmol/L (ref 135–145)

## 2018-08-18 MED ORDER — METOLAZONE 2.5 MG PO TABS: 2.5000 mg | ORAL_TABLET | ORAL | 6 refills | Status: DC

## 2018-08-18 MED ORDER — TORSEMIDE 100 MG PO TABS: 100.0000 mg | ORAL_TABLET | Freq: Two times a day (BID) | ORAL | 3 refills | Status: DC

## 2018-08-18 MED ORDER — POTASSIUM CHLORIDE CRYS ER 20 MEQ PO TBCR: EXTENDED_RELEASE_TABLET | ORAL | 3 refills | Status: DC

## 2018-08-18 NOTE — Patient Instructions (Signed)
Labs were done today. We will call you with any ABNORMAL results. No news is good news!  INCREASE Torsemide to 100 mg (1 tab) twice a day.  INCREASE Potassium to 40 mEq (2 tabs) in the AM and 20 mEq (1 tab) in the PM.  INCREASE Metolazone to 2.5 mg (1 tab) every Saturday, Monday, & Wednesday.  TAKE AN EXTRA 40 mEq (2 tabs) OF POTASSIUM EACH TIME YOU TAKE A METOLAZONE.   You have been given a prescription for compression socks.   Your physician recommends that you schedule a follow-up appointment next week with AMY.  At the [facility], you and your health needs are our priority. As part of our continuing mission to provide you with exceptional heart care, we have created designated Provider Care Teams. These Care Teams include your primary Cardiologist (physician) and Advanced Practice Providers (APPs- Physician Assistants and Nurse Practitioners) who all work together to provide you with the care you need, when you need it.   You may see any of the following providers on your designated Care Team at your next follow up: Marland Kitchen Dr Glori Bickers . Dr Loralie Champagne . Darrick Grinder, NP

## 2018-08-18 NOTE — Progress Notes (Signed)
PCP: Chesley Noon, MD Cardiology: Dr. Aundra Dubin  85 yo with history of interstitial lung disease (?NSIP vs UIP) on home oxygen, pulmonary hypertension, diastolic CHF, and prior non-Hodgkins lymphoma (pelvic mass, s/p XRT) presents for followup of CHF and pulmonary hypertension.  Patient has history of reportedly mild ILD (NSIP vs UIP) followed by a pulmonologist at [facility] and pulmonary hypertension thought to be out of proportion lung disease followed by a physician at [facility].  Last CT chest was in 11/19, showing stable ILD (?UIP vs NSIP).  He had RHC in 2/20 with moderate PAH, PVR 5.1 WU.  He was tried on Adcirca but developed cognitive problems and worsening of peripheral edema so this was stopped.  He has been on home oxygen, uses at night and with exertion.   He was admitted in 6/20 with worsening dyspnea and peripheral edema, weight up despite compliance with torsemide 40 mg bid at home.  He has CKD stage 3 and torsemide was decreased in the spring. He was diuresed aggressively with fall in weight.  Echo showed normal LV size and systolic function, RV reportedly normal.  6/20 RHC showed normal filling pressures with moderate pulmonary hypertension, PVR 5.35 WU.   After discharge from hospital, patient started on ambrisentan.  His weight steadily began to increase and he developed worsening lower extremity edema.  I saw him last week and stopped the ambrisentan and added metolazone weekly.  His weight is down 4 lbs but he is still very swollen. He is now using oxygen at all times.  He is short of breath walking around his room at assisted living and sleeps sitting in a chair.    REDS clip 34%  ECG (personally reviewed): NSR, RBBB, PAC   Labs (6/20): K 4.7, creatinine 1.46 Labs (7/20): K 3.1, creatinine 1.67  PMH: 1. Non-Hodgkin's lymphoma: Pelvic involvement.  S/p radiation.  Thought to be in remission (Dr. Benay Spice).  2. HTN 3. PACs/PVCs 4. GERD: Barrett's esophagus.  5. Pulmonary  fibrosis: NSIP vs UIP.  - High resolution CT chest 11/19: ILD, ?UIP pattern.  6. Pulmonary hypertension: Thought to be out of proportion to underlying lung disease (ILD, ?NSIP vs UIP).   - RHC (2/20): mean RA 10, PASP 60 with mean 42, mean PCWP 11, CI 3, PVR 5.1 WU.  - Trial of Adcirca => failed due to ?memory worsening/cognitive affects and peripheral edema.  - Echo (6/20): EF 60-65%, mild LVH, RV reportedly normal, PASP 59 mmHg.  - RHC (6/20): mean RA 7, PA 50/24 mean 34, mean PCWP 12, CI 2.01, PAPI 3.7, PVR 5.35 WU - RF negative, ANA negative, anti-SCL70 negative, anti-centromere negative.  7. CKD: Stage 3.   Social History   Socioeconomic History  . Marital status: Married    Spouse name: Not on file  . Number of children: 2  . Years of education: Not on file  . Highest education level: Not on file  Occupational History  . 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 Financial resource strain: Not on file  . Food insecurity    Worry: Not on file    Inability: Not on file  . Transportation needs    Medical: Not on file    Non-medical: Not on file  Tobacco Use  . Smoking status: Former Smoker    Packs/day: 1.00    Years: 35.00    Pack years: 35.00    Types: Pipe, Cigarettes    Quit date:  02/23/1992    Years since quitting: 26.5  . Smokeless tobacco: Never Used  . Tobacco comment: quit 20 years ago  Substance and Sexual Activity  . Alcohol use: Yes    Comment: 2 drinks daily scotch  AND WINE WITH SUPPER  . Drug use: No  . Sexual activity: Not on file  Lifestyle  . Physical activity    Days per week: Not on file    Minutes per session: Not on file  . Stress: Not on file  Relationships  . Social Herbalist on phone: Not on file    Gets together: Not on file    Attends religious service: Not on file    Active member of club or organization: Not on file    Attends meetings of clubs or organizations: Not on file     Relationship status: Not on file  . Intimate partner violence    Fear of current or ex partner: Not on file    Emotionally abused: Not on file    Physically abused: Not on file    Forced sexual activity: Not on file  Other Topics Concern  . Not on file  Social History Narrative   Married - second marriage   He has two children   Retired Horticulturist, commercial Professor   Currently teaches pottery making, 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[facility]   Former Smoker quit 12 years ago- smoked for 35 years   Alcohol use- yes   Family History  Problem Relation Age of Onset  . Heart disease Father        MI 38  . Urticaria Father   . Prostate cancer Brother   . Prostate cancer Paternal Uncle   . Prostate cancer Paternal Uncle   . Prostate cancer Paternal Uncle   . Hyperlipidemia Other   . Stroke Other   . Hypertension Other   . ADD / ADHD Other   . Colon cancer Neg Hx   . Allergic rhinitis Neg Hx   . Asthma Neg Hx   . Eczema Neg Hx    ROS: All systems reviewed and negative except as per HPI.   Current Outpatient Medications  Medication Sig Dispense Refill  . allopurinol (ZYLOPRIM) 100 MG tablet Take 50 mg by mouth daily.    . finasteride (PROSCAR) 5 MG tablet Take 1 tablet (5 mg total) by mouth daily. 30 tablet 5  . FLUoxetine (PROZAC) 10 MG capsule Take 10 mg by mouth daily.    Marland Kitchen LORazepam (ATIVAN) 0.5 MG tablet     . metolazone (ZAROXOLYN) 2.5 MG tablet Take 1 tablet (2.5 mg total) by mouth 3 (three) times a week. Take 1 tablet every Saturday/Monday/Wednesday 15 tablet 6  . nystatin (MYCOSTATIN) 100000 UNIT/ML suspension Use as directed 5 mLs in the mouth or throat 4 (four) times daily. Swish and swallow    . omeprazole (PRILOSEC) 20 MG capsule Take 20 mg by mouth daily.    . potassium chloride SA (K-DUR) 20 MEQ tablet Take 2 tablets (40 mEq total) by mouth every morning AND 1 tablet (20 mEq total) at bedtime. And take 40 meq on Saturdays with Metolazone. 270 tablet 3  . pramipexole (MIRAPEX)  0.75 MG tablet Take 0.75 mg by mouth at bedtime.    . RESTASIS 0.05 % ophthalmic emulsion     . spironolactone (ALDACTONE) 25 MG tablet Take 0.5 tablets (12.5 mg total) by mouth daily. 10 tablet 0  . torsemide (DEMADEX) 100 MG tablet Take  1 tablet (100 mg total) by mouth 2 (two) times a day. 180 tablet 3  . traMADol (ULTRAM) 50 MG tablet Take 50 mg by mouth as needed for moderate pain.     . traZODone (DESYREL) 100 MG tablet Take 100 mg by mouth at bedtime.    . triazolam (HALCION) 0.25 MG tablet Take 1 tablet by mouth at bedtime as needed for sleep.      No current facility-administered medications for this encounter.    BP 132/82   Pulse 70   Wt 97.6 kg (215 lb 3.2 oz)   SpO2 97%   BMI 33.71 kg/m  General: NAD Neck: JVP difficult, appears elevated 8-9 cm, no thyromegaly or thyroid nodule.  Lungs: Clear to auscultation bilaterally with normal respiratory effort. CV: Nondisplaced PMI.  Heart regular S1/S2, no S3/S4, no murmur.  2+ edema to thighs.  No carotid bruit.  Normal pedal pulses.  Abdomen: Soft, nontender, no hepatosplenomegaly, mild-moderate distention.  Skin: Intact without lesions or rashes.  Neurologic: Alert and oriented x 3.  Psych: Normal affect. Extremities: No clubbing or cyanosis.  HEENT: Normal.    Assessment/Plan: 1. Chronic diastolic CHF: Suspect with significant component of RV failure from pulmonary hypertension. Echo in 6/20 with EF 60-65%, RV not well-visualized but PASP 59 mmHg.  RHC in 6/20 showed filling pressures optimized, but since hospital discharge he had gained significant weight.  I increased his diuretics last week and he has lost 4 lbs, but he remains very volume overloaded on exam with NYHA class IIIb symptoms (surprisingly, REDS clip is not significantly elevated, ?because of RV failure without significant pulmonary edema).  - Increase torsemide to 100 mg bid and increase KCl to 40 qam/20 qpm.   - Increase metolazone to 2.5 mg every  Saturday/Monday/Wednesday (three days/week).  Take an extra 20 KCl on metolazone days.   - Continue spironolactone 12.5 daily.  2. CKD Stage 3: BMET today and 1 week.  3. Pulmonary hypertnesion: Patient thought to have [place] out of proportion to his lung disease (ILD, NSIP versus UIP). RHC in 2/20 showed mean PA pressure 41, PVR 5.1 WU, CI 3. Echo in 6/20 with RV reportedly ok =>I reviewed and do not think that RV was well-visualized. Patient did not tolerate Adcirca due to edema/?cognitive effects.  RHC was done again in 6/20, showed moderate pulmonary arterial hypertension with PVR 5.35 (comparable to prior).  Serological workup for PH was negative. Patient started ambrisentan 5 mg and developed significant edema/volume overload despite a high dose of torsemide.  I am concerned that ambrisentan may have been part of the reason for the peripheral edema/volume overload.  - he will stay off ambrisentan (?trigger for volume overload).  - V/Q scan to rule out chronic PEs pending.    - Home sleep study ordered.  - When he is better diuresed, will need to consider Uptravi (intolerant of Adcirca, Letairis).   Work in with NP next week.  If he is getting worse or not improving, may need admission.   Loralie Champagne 08/18/2018

## 2018-08-18 NOTE — Progress Notes (Signed)
ReDS Vest - 08/18/18 1048      ReDS Vest   Estimated volume prior to reading  Med    Fitting Posture  Sitting    Height Marker  Tall    Ruler Value  35    Center Strip  Aligned    ReDS Value  34    Anatomical Comments  station D

## 2018-08-25 ENCOUNTER — Encounter (HOSPITAL_COMMUNITY): Payer: Self-pay

## 2018-08-25 ENCOUNTER — Other Ambulatory Visit: Payer: Self-pay

## 2018-08-25 ENCOUNTER — Ambulatory Visit (HOSPITAL_COMMUNITY)
Admission: RE | Admit: 2018-08-25 | Discharge: 2018-08-25 | Disposition: A | Discharge status: Home / Self Care | Payer: Medicare HMO | Source: Ambulatory Visit | Attending: Internal Medicine | Admitting: Internal Medicine

## 2018-08-25 VITALS — BP 128/78 | HR 87 | Wt 210.8 lb

## 2018-08-25 DIAGNOSIS — I10 Essential (primary) hypertension: Secondary | ICD-10-CM

## 2018-08-25 DIAGNOSIS — Z79899 Other long term (current) drug therapy: Secondary | ICD-10-CM | POA: Insufficient documentation

## 2018-08-25 DIAGNOSIS — Z87891 Personal history of nicotine dependence: Secondary | ICD-10-CM | POA: Diagnosis not present

## 2018-08-25 DIAGNOSIS — Z8249 Family history of ischemic heart disease and other diseases of the circulatory system: Secondary | ICD-10-CM | POA: Insufficient documentation

## 2018-08-25 DIAGNOSIS — I130 Hypertensive heart and chronic kidney disease with heart failure and stage 1 through stage 4 chronic kidney disease, or unspecified chronic kidney disease: Secondary | ICD-10-CM | POA: Insufficient documentation

## 2018-08-25 DIAGNOSIS — I2721 Secondary pulmonary arterial hypertension: Secondary | ICD-10-CM | POA: Insufficient documentation

## 2018-08-25 DIAGNOSIS — K219 Gastro-esophageal reflux disease without esophagitis: Secondary | ICD-10-CM | POA: Insufficient documentation

## 2018-08-25 DIAGNOSIS — N183 Chronic kidney disease, stage 3 (moderate): Secondary | ICD-10-CM | POA: Diagnosis not present

## 2018-08-25 DIAGNOSIS — I5032 Chronic diastolic (congestive) heart failure: Secondary | ICD-10-CM | POA: Insufficient documentation

## 2018-08-25 DIAGNOSIS — I272 Pulmonary hypertension, unspecified: Secondary | ICD-10-CM

## 2018-08-25 DIAGNOSIS — R0683 Snoring: Secondary | ICD-10-CM

## 2018-08-25 DIAGNOSIS — Z8572 Personal history of non-Hodgkin lymphomas: Secondary | ICD-10-CM | POA: Insufficient documentation

## 2018-08-25 LAB — BASIC METABOLIC PANEL
Anion gap: 14 (ref 5–15)
BUN: 59 mg/dL — ABNORMAL HIGH (ref 8–23)
CO2: 37 mmol/L — ABNORMAL HIGH (ref 22–32)
Calcium: 8.8 mg/dL — ABNORMAL LOW (ref 8.9–10.3)
Chloride: 84 mmol/L — ABNORMAL LOW (ref 98–111)
Creatinine, Ser: 1.86 mg/dL — ABNORMAL HIGH (ref 0.61–1.24)
GFR calc Af Amer: 38 mL/min — ABNORMAL LOW (ref >60)
GFR calc non Af Amer: 32 mL/min — ABNORMAL LOW (ref >60)
Glucose, Bld: 167 mg/dL — ABNORMAL HIGH (ref 70–99)
Potassium: 3.9 mmol/L (ref 3.5–5.1)
Sodium: 135 mmol/L (ref 135–145)

## 2018-08-25 NOTE — Patient Instructions (Signed)
Lab work done today. We will notify you of any abnormal lab work at [phone]. However no news is good news.  Please follow up with the [facility] in 2-3 months.  At the [facility], you and your health needs are our priority. As part of our continuing mission to provide you with exceptional heart care, we have created designated Provider Care Teams. These Care Teams include your primary Cardiologist (physician) and Advanced Practice Providers (APPs- Physician Assistants and Nurse Practitioners) who all work together to provide you with the care you need, when you need it.   You may see any of the following providers on your designated Care Team at your next follow up: Marland Kitchen Dr Glori Bickers . Dr Loralie Champagne . Darrick Grinder, NP

## 2018-08-25 NOTE — Progress Notes (Signed)
PCP: Chesley Noon, MD Cardiology: Dr. Aundra Dubin  85 yo with history of interstitial lung disease (?NSIP vs UIP) on home oxygen, pulmonary hypertension, diastolic CHF, and prior non-Hodgkins lymphoma (pelvic mass, s/p XRT) presents for followup of CHF and pulmonary hypertension.  Patient has history of reportedly mild ILD (NSIP vs UIP) followed by a pulmonologist at [facility] and pulmonary hypertension thought to be out of proportion lung disease followed by a physician at [facility].  Last CT chest was in 11/19, showing stable ILD (?UIP vs NSIP).  He had RHC in 2/20 with moderate PAH, PVR 5.1 WU.  He was tried on Adcirca but developed cognitive problems and worsening of peripheral edema so this was stopped.  He has been on home oxygen, uses at night and with exertion.   He was admitted in 6/20 with worsening dyspnea and peripheral edema, weight up despite compliance with torsemide 40 mg bid at home.  He has CKD stage 3 and torsemide was decreased in the spring. He was diuresed aggressively with fall in weight.  Echo showed normal LV size and systolic function, RV reportedly normal.  6/20 RHC showed normal filling pressures with moderate pulmonary hypertension, PVR 5.35 WU.   After discharge from hospital, patient started on ambrisentan but later stopped due to increased lower extremity edema.   Today he returns for HF follow up with his wife. Last week he was seen by Dr Aundra Dubin and torsemide was increased to 100/100 and metolazone three times a week. Overall feeling a little better. Ongoing SOB with exertion but says its a little better. Limited to his apartment. Not walking much. Remains on 2 liters oxygen. Denies dizziness/syncope. Denies PND/Orthopnea. Appetite ok. No fever or chills. Weight at home  Has gone down form 213--->209 pounds. Taking all medications. Lives at [place].   Labs (6/20): K 4.7, creatinine 1.46 Labs (7/20): K 3.1, creatinine 1.67  PMH: 1. Non-Hodgkin's  lymphoma: Pelvic involvement.  S/p radiation.  Thought to be in remission (Dr. Benay Spice).  2. HTN 3. PACs/PVCs 4. GERD: Barrett's esophagus.  5. 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  - High resolution CT chest 11/19: ILD, ?UIP pattern.  6. Pulmonary hypertension: Thought to be out of proportion to underlying lung disease (ILD, ?NSIP vs UIP).   - RHC (2/20): mean RA 10, PASP 60 with mean 42, mean PCWP 11, CI 3, PVR 5.1 WU.  - Trial of Adcirca => failed due to ?memory worsening/cognitive affects and peripheral edema.  - Echo (6/20): EF 60-65%, mild LVH, RV reportedly normal, PASP 59 mmHg.  - RHC (6/20): mean RA 7, PA 50/24 mean 34, mean PCWP 12, CI 2.01, PAPI 3.7, PVR 5.35 WU - RF negative, ANA negative, anti-SCL70 negative, anti-centromere negative.  7. CKD: Stage 3.   Social History   Socioeconomic History  . Marital status: Married    Spouse name: Not on file  . Number of children: 2  . Years of education: Not on file  . Highest education level: Not on file  Occupational History  . 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 Financial resource strain: Not on file  . Food insecurity    Worry: Not on file    Inability: Not on file  . Transportation needs    Medical: Not on file    Non-medical: Not on file  Tobacco Use  . Smoking status: Former Smoker    Packs/day: 1.00    Years: 35.00  Pack years: 35.00    Types: Pipe, Cigarettes    Quit date: 02/23/1992    Years since quitting: 26.5  . Smokeless tobacco: Never Used  . Tobacco comment: quit 20 years ago  Substance and Sexual Activity  . Alcohol use: Yes    Comment: 2 drinks daily scotch  AND WINE WITH SUPPER  . Drug use: No  . Sexual activity: Not on file  Lifestyle  . Physical activity    Days per week: Not on file    Minutes per session: Not on file  . Stress: Not on file  Relationships  . Social Herbalist on phone: Not on file    Gets together: Not on file     Attends religious service: Not on file    Active member of club or organization: Not on file    Attends meetings of clubs or organizations: Not on file    Relationship status: Not on file  . Intimate partner violence    Fear of current or ex partner: Not on file    Emotionally abused: Not on file    Physically abused: Not on file    Forced sexual activity: Not on file  Other Topics Concern  . Not on file  Social History Narrative   Married - second marriage   He has two children   Retired Horticulturist, commercial Professor   Currently teaches pottery making, 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[facility]   Former Smoker quit 12 years ago- smoked for 35 years   Alcohol use- yes   Family History  Problem Relation Age of Onset  . Heart disease Father        MI 26  . Urticaria Father   . Prostate cancer Brother   . Prostate cancer Paternal Uncle   . Prostate cancer Paternal Uncle   . Prostate cancer Paternal Uncle   . Hyperlipidemia Other   . Stroke Other   . Hypertension Other   . ADD / ADHD Other   . Colon cancer Neg Hx   . Allergic rhinitis Neg Hx   . Asthma Neg Hx   . Eczema Neg Hx    ROS: All systems reviewed and negative except as per HPI.   Current Outpatient Medications  Medication Sig Dispense Refill  . allopurinol (ZYLOPRIM) 100 MG tablet Take 50 mg by mouth daily.    . finasteride (PROSCAR) 5 MG tablet Take 1 tablet (5 mg total) by mouth daily. 30 tablet 5  . FLUoxetine (PROZAC) 10 MG capsule Take 10 mg by mouth daily.    Marland Kitchen LORazepam (ATIVAN) 0.5 MG tablet     . metolazone (ZAROXOLYN) 2.5 MG tablet Take 1 tablet (2.5 mg total) by mouth 3 (three) times a week. Take 1 tablet every Saturday/Monday/Wednesday 15 tablet 6  . nystatin (MYCOSTATIN) 100000 UNIT/ML suspension Use as directed 5 mLs in the mouth or throat 4 (four) times daily. Swish and swallow    . omeprazole (PRILOSEC) 20 MG capsule Take 20 mg by mouth daily.    . potassium chloride SA (K-DUR) 20 MEQ tablet Take 2 tablets (40 mEq  total) by mouth every morning AND 1 tablet (20 mEq total) at bedtime. And take 40 meq on Saturdays with Metolazone. 270 tablet 3  . pramipexole (MIRAPEX) 0.75 MG tablet Take 0.75 mg by mouth at bedtime.    . RESTASIS 0.05 % ophthalmic emulsion     . spironolactone (ALDACTONE) 25 MG tablet Take 0.5 tablets (12.5 mg total)  by mouth daily. 10 tablet 0  . torsemide (DEMADEX) 20 MG tablet Take 20 mg by mouth daily. 100 mg in the AM and 80 mg in the PM    . traMADol (ULTRAM) 50 MG tablet Take 50 mg by mouth as needed for moderate pain.     . traZODone (DESYREL) 100 MG tablet Take 100 mg by mouth at bedtime.    . triazolam (HALCION) 0.25 MG tablet Take 1 tablet by mouth at bedtime as needed for sleep.      No current facility-administered medications for this encounter.    BP 128/78   Pulse 87   Wt 95.6 kg (210 lb 12.8 oz)   SpO2 91% Comment: on 2L  BMI 33.02 kg/m   Wt Readings from Last 3 Encounters:  08/25/18 95.6 kg (210 lb 12.8 oz)  08/18/18 97.6 kg (215 lb 3.2 oz)  08/11/18 99.4 kg (219 lb 3.2 oz)   Reds Clip 31%.  General: Appears chronically ill.  No resp difficulty. Arrived in wheel chair  HEENT: normal Neck: supple. JVP 6-7  Carotids 2+ bilat; no bruits. No lymphadenopathy or thryomegaly appreciated. Cor: PMI nondisplaced. Regular rate & rhythm. No rubs, gallops or murmurs. Lungs: RLL crackles on 2 liters.  Abdomen: soft, nontender, nondistended. No hepatosplenomegaly. No bruits or masses. Good bowel sounds. Extremities: no cyanosis, clubbing, rash, R andLLE trace -1+ edema Neuro: alert & orientedx3, cranial nerves grossly intact. moves all 4 extremities w/o difficulty. Affect pleasant    Assessment/Plan: 1. Chronic diastolic CHF: Suspect with significant component of RV failure from pulmonary hypertension. Echo in 6/20 with EF 60-65%, RV not well-visualized but PASP 59 mmHg.  RHC in 6/20 showed filling pressures optimized, but since hospital discharge he had gained significant  weight.   NYHA IIIb. Volume status improved.  -Continue torsemide to 100 mg bid and  KCl to 40 qam/20 qpm.   - Continue metolazone to 2.5 mg every Saturday/Monday/Wednesday (three days/week).  Take an extra 20 KCl on metolazone days.   - Continue spironolactone 12.5 daily. If BMEt stable will increase spironolactone to 25 mg daily.   2. CKD Stage 3: Check BMEt today.   3. Pulmonary hypertnesion: Patient thought to have [place] out of proportion to his lung disease (ILD, NSIP versus UIP). RHC in 2/20 showed mean PA pressure 41, PVR 5.1 WU, CI 3. Echo in 6/20 with RV reportedly ok =>I reviewed and do not think that RV was well-visualized. Patient did not tolerate Adcirca due to edema/?cognitive effects.  RHC was done again in 6/20, showed moderate pulmonary arterial hypertension with PVR 5.35 (comparable to prior).  Serological workup for PH was negative. Patient started ambrisentan 5 mg and developed significant edema/volume overload despite a high dose of torsemide.  - he will stay off ambrisentan (?trigger for volume overload).  - V/Q scan to rule out chronic PEs pending.    -Home sleep study completed.  Results pending.  - Consider Uptravi (intolerant of Adcirca, Letairis).   Check BMEt today   Follow up with Dr Aundra Dubin 3-4 weeks.   Luisfernando Brightwell NP-C  08/25/2018

## 2018-08-25 NOTE — Progress Notes (Signed)
ReDS Vest - 08/25/18 1200      ReDS Vest   Fitting Posture  Sitting    Height Marker  Tall    Ruler Value  35    Center Strip  Aligned    ReDS Value  31

## 2018-08-31 ENCOUNTER — Inpatient Hospital Stay: Payer: Medicare HMO | Admitting: Oncology

## 2018-09-07 ENCOUNTER — Emergency Department (HOSPITAL_COMMUNITY): Payer: Medicare HMO

## 2018-09-07 ENCOUNTER — Other Ambulatory Visit: Payer: Self-pay

## 2018-09-07 ENCOUNTER — Telehealth (HOSPITAL_COMMUNITY): Payer: Self-pay | Admitting: Cardiology

## 2018-09-07 ENCOUNTER — Inpatient Hospital Stay (HOSPITAL_COMMUNITY)
Admission: EM | Admit: 2018-09-07 | Discharge: 2018-09-12 | DRG: 291 | Disposition: A | Discharge status: Home Health | Payer: Medicare HMO | Attending: Internal Medicine | Admitting: Internal Medicine

## 2018-09-07 ENCOUNTER — Encounter (HOSPITAL_COMMUNITY): Payer: Self-pay | Admitting: Internal Medicine

## 2018-09-07 DIAGNOSIS — N400 Enlarged prostate without lower urinary tract symptoms: Secondary | ICD-10-CM | POA: Diagnosis present

## 2018-09-07 DIAGNOSIS — R269 Unspecified abnormalities of gait and mobility: Secondary | ICD-10-CM | POA: Diagnosis present

## 2018-09-07 DIAGNOSIS — G2581 Restless legs syndrome: Secondary | ICD-10-CM | POA: Diagnosis present

## 2018-09-07 DIAGNOSIS — E871 Hypo-osmolality and hyponatremia: Secondary | ICD-10-CM | POA: Diagnosis present

## 2018-09-07 DIAGNOSIS — K449 Diaphragmatic hernia without obstruction or gangrene: Secondary | ICD-10-CM | POA: Diagnosis present

## 2018-09-07 DIAGNOSIS — J8410 Pulmonary fibrosis, unspecified: Secondary | ICD-10-CM | POA: Diagnosis present

## 2018-09-07 DIAGNOSIS — I272 Pulmonary hypertension, unspecified: Secondary | ICD-10-CM | POA: Diagnosis not present

## 2018-09-07 DIAGNOSIS — R0602 Shortness of breath: Secondary | ICD-10-CM

## 2018-09-07 DIAGNOSIS — N179 Acute kidney failure, unspecified: Secondary | ICD-10-CM | POA: Diagnosis present

## 2018-09-07 DIAGNOSIS — E1165 Type 2 diabetes mellitus with hyperglycemia: Secondary | ICD-10-CM | POA: Diagnosis present

## 2018-09-07 DIAGNOSIS — W19XXXA Unspecified fall, initial encounter: Secondary | ICD-10-CM | POA: Diagnosis present

## 2018-09-07 DIAGNOSIS — I471 Supraventricular tachycardia: Secondary | ICD-10-CM | POA: Diagnosis not present

## 2018-09-07 DIAGNOSIS — R739 Hyperglycemia, unspecified: Secondary | ICD-10-CM

## 2018-09-07 DIAGNOSIS — J309 Allergic rhinitis, unspecified: Secondary | ICD-10-CM | POA: Diagnosis present

## 2018-09-07 DIAGNOSIS — K9041 Non-celiac gluten sensitivity: Secondary | ICD-10-CM | POA: Diagnosis present

## 2018-09-07 DIAGNOSIS — E1122 Type 2 diabetes mellitus with diabetic chronic kidney disease: Secondary | ICD-10-CM | POA: Diagnosis present

## 2018-09-07 DIAGNOSIS — Z66 Do not resuscitate: Secondary | ICD-10-CM | POA: Diagnosis present

## 2018-09-07 DIAGNOSIS — E785 Hyperlipidemia, unspecified: Secondary | ICD-10-CM | POA: Diagnosis present

## 2018-09-07 DIAGNOSIS — E739 Lactose intolerance, unspecified: Secondary | ICD-10-CM | POA: Diagnosis present

## 2018-09-07 DIAGNOSIS — Z888 Allergy status to other drugs, medicaments and biological substances status: Secondary | ICD-10-CM

## 2018-09-07 DIAGNOSIS — I5033 Acute on chronic diastolic (congestive) heart failure: Secondary | ICD-10-CM | POA: Diagnosis present

## 2018-09-07 DIAGNOSIS — I130 Hypertensive heart and chronic kidney disease with heart failure and stage 1 through stage 4 chronic kidney disease, or unspecified chronic kidney disease: Secondary | ICD-10-CM | POA: Diagnosis present

## 2018-09-07 DIAGNOSIS — J9610 Chronic respiratory failure, unspecified whether with hypoxia or hypercapnia: Secondary | ICD-10-CM | POA: Diagnosis present

## 2018-09-07 DIAGNOSIS — J449 Chronic obstructive pulmonary disease, unspecified: Secondary | ICD-10-CM | POA: Diagnosis present

## 2018-09-07 DIAGNOSIS — Z8572 Personal history of non-Hodgkin lymphomas: Secondary | ICD-10-CM

## 2018-09-07 DIAGNOSIS — M545 Low back pain: Secondary | ICD-10-CM | POA: Diagnosis present

## 2018-09-07 DIAGNOSIS — N183 Chronic kidney disease, stage 3 (moderate): Secondary | ICD-10-CM | POA: Diagnosis present

## 2018-09-07 DIAGNOSIS — I5082 Biventricular heart failure: Secondary | ICD-10-CM | POA: Diagnosis present

## 2018-09-07 DIAGNOSIS — Z7289 Other problems related to lifestyle: Secondary | ICD-10-CM

## 2018-09-07 DIAGNOSIS — I2721 Secondary pulmonary arterial hypertension: Secondary | ICD-10-CM | POA: Diagnosis present

## 2018-09-07 DIAGNOSIS — M79606 Pain in leg, unspecified: Secondary | ICD-10-CM | POA: Diagnosis present

## 2018-09-07 DIAGNOSIS — Z9981 Dependence on supplemental oxygen: Secondary | ICD-10-CM

## 2018-09-07 DIAGNOSIS — Z79891 Long term (current) use of opiate analgesic: Secondary | ICD-10-CM

## 2018-09-07 DIAGNOSIS — Z8249 Family history of ischemic heart disease and other diseases of the circulatory system: Secondary | ICD-10-CM

## 2018-09-07 DIAGNOSIS — F5104 Psychophysiologic insomnia: Secondary | ICD-10-CM | POA: Diagnosis present

## 2018-09-07 DIAGNOSIS — Z87891 Personal history of nicotine dependence: Secondary | ICD-10-CM

## 2018-09-07 DIAGNOSIS — G8929 Other chronic pain: Secondary | ICD-10-CM | POA: Diagnosis present

## 2018-09-07 DIAGNOSIS — Z8719 Personal history of other diseases of the digestive system: Secondary | ICD-10-CM

## 2018-09-07 DIAGNOSIS — Z20828 Contact with and (suspected) exposure to other viral communicable diseases: Secondary | ICD-10-CM | POA: Diagnosis present

## 2018-09-07 DIAGNOSIS — Z923 Personal history of irradiation: Secondary | ICD-10-CM

## 2018-09-07 DIAGNOSIS — E876 Hypokalemia: Secondary | ICD-10-CM | POA: Diagnosis present

## 2018-09-07 DIAGNOSIS — I5032 Chronic diastolic (congestive) heart failure: Secondary | ICD-10-CM

## 2018-09-07 DIAGNOSIS — K219 Gastro-esophageal reflux disease without esophagitis: Secondary | ICD-10-CM | POA: Diagnosis present

## 2018-09-07 DIAGNOSIS — N189 Chronic kidney disease, unspecified: Secondary | ICD-10-CM | POA: Diagnosis not present

## 2018-09-07 DIAGNOSIS — Z79899 Other long term (current) drug therapy: Secondary | ICD-10-CM

## 2018-09-07 DIAGNOSIS — I50813 Acute on chronic right heart failure: Secondary | ICD-10-CM

## 2018-09-07 DIAGNOSIS — I10 Essential (primary) hypertension: Secondary | ICD-10-CM | POA: Diagnosis present

## 2018-09-07 DIAGNOSIS — I4891 Unspecified atrial fibrillation: Secondary | ICD-10-CM | POA: Diagnosis not present

## 2018-09-07 DIAGNOSIS — I509 Heart failure, unspecified: Secondary | ICD-10-CM | POA: Insufficient documentation

## 2018-09-07 DIAGNOSIS — J441 Chronic obstructive pulmonary disease with (acute) exacerbation: Secondary | ICD-10-CM | POA: Diagnosis present

## 2018-09-07 DIAGNOSIS — Z85820 Personal history of malignant melanoma of skin: Secondary | ICD-10-CM

## 2018-09-07 DIAGNOSIS — E860 Dehydration: Secondary | ICD-10-CM | POA: Diagnosis present

## 2018-09-07 DIAGNOSIS — K5900 Constipation, unspecified: Secondary | ICD-10-CM | POA: Diagnosis present

## 2018-09-07 HISTORY — DX: Heart failure, unspecified: I50.9

## 2018-09-07 HISTORY — DX: Chronic obstructive pulmonary disease, unspecified: J44.9

## 2018-09-07 LAB — CBC WITH DIFFERENTIAL/PLATELET
Abs Immature Granulocytes: 0.24 10*3/uL — ABNORMAL HIGH (ref 0.00–0.07)
Basophils Absolute: 0 10*3/uL (ref 0.0–0.1)
Basophils Relative: 0 %
Eosinophils Absolute: 0.1 10*3/uL (ref 0.0–0.5)
Eosinophils Relative: 0 %
HCT: 50.2 % (ref 39.0–52.0)
Hemoglobin: 16.2 g/dL (ref 13.0–17.0)
Immature Granulocytes: 1 %
Lymphocytes Relative: 8 %
Lymphs Abs: 1.3 10*3/uL (ref 0.7–4.0)
MCH: 28.4 pg (ref 26.0–34.0)
MCHC: 32.3 g/dL (ref 30.0–36.0)
MCV: 87.9 fL (ref 80.0–100.0)
Monocytes Absolute: 1.1 10*3/uL — ABNORMAL HIGH (ref 0.1–1.0)
Monocytes Relative: 7 %
Neutro Abs: 13.9 10*3/uL — ABNORMAL HIGH (ref 1.7–7.7)
Neutrophils Relative %: 84 %
Platelets: 240 10*3/uL (ref 150–400)
RBC: 5.71 MIL/uL (ref 4.22–5.81)
RDW: 15.3 % (ref 11.5–15.5)
WBC: 16.6 10*3/uL — ABNORMAL HIGH (ref 4.0–10.5)
nRBC: 0 % (ref 0.0–0.2)

## 2018-09-07 LAB — COMPREHENSIVE METABOLIC PANEL
ALT: 27 U/L (ref 0–44)
AST: 28 U/L (ref 15–41)
Albumin: 3.5 g/dL (ref 3.5–5.0)
Alkaline Phosphatase: 125 U/L (ref 38–126)
Anion gap: 17 — ABNORMAL HIGH (ref 5–15)
BUN: 104 mg/dL — ABNORMAL HIGH (ref 8–23)
CO2: 32 mmol/L (ref 22–32)
Calcium: 8.4 mg/dL — ABNORMAL LOW (ref 8.9–10.3)
Chloride: 74 mmol/L — ABNORMAL LOW (ref 98–111)
Creatinine, Ser: 2.26 mg/dL — ABNORMAL HIGH (ref 0.61–1.24)
GFR calc Af Amer: 30 mL/min — ABNORMAL LOW (ref >60)
GFR calc non Af Amer: 26 mL/min — ABNORMAL LOW (ref >60)
Glucose, Bld: 403 mg/dL — ABNORMAL HIGH (ref 70–99)
Potassium: 3.6 mmol/L (ref 3.5–5.1)
Sodium: 123 mmol/L — ABNORMAL LOW (ref 135–145)
Total Bilirubin: 0.4 mg/dL (ref 0.3–1.2)
Total Protein: 6.8 g/dL (ref 6.5–8.1)

## 2018-09-07 LAB — URINALYSIS, ROUTINE W REFLEX MICROSCOPIC
Bilirubin Urine: NEGATIVE
Glucose, UA: 50 mg/dL — AB
Hgb urine dipstick: NEGATIVE
Ketones, ur: NEGATIVE mg/dL
Leukocytes,Ua: NEGATIVE
Nitrite: NEGATIVE
Protein, ur: NEGATIVE mg/dL
Specific Gravity, Urine: 1.009 (ref 1.005–1.030)
pH: 6 (ref 5.0–8.0)

## 2018-09-07 LAB — SARS CORONAVIRUS 2 BY RT PCR (HOSPITAL ORDER, PERFORMED IN ~~LOC~~ HOSPITAL LAB): SARS Coronavirus 2: NEGATIVE

## 2018-09-07 LAB — BRAIN NATRIURETIC PEPTIDE: B Natriuretic Peptide: 40.6 pg/mL (ref 0.0–100.0)

## 2018-09-07 LAB — GLUCOSE, CAPILLARY: Glucose-Capillary: 269 mg/dL — ABNORMAL HIGH (ref 70–99)

## 2018-09-07 LAB — CBG MONITORING, ED: Glucose-Capillary: 370 mg/dL — ABNORMAL HIGH (ref 70–99)

## 2018-09-07 MED ORDER — PRAMIPEXOLE DIHYDROCHLORIDE 0.25 MG PO TABS
0.7500 mg | ORAL_TABLET | Freq: Every day | ORAL | Status: DC
  Administered 2018-09-07 – 2018-09-11 (×5): 0.75 mg via ORAL
  Filled 2018-09-07 (×6): qty 3

## 2018-09-07 MED ORDER — DILTIAZEM HCL 25 MG/5ML IV SOLN
10.0000 mg | Freq: Once | INTRAVENOUS | Status: AC
  Administered 2018-09-07: 10 mg via INTRAVENOUS
  Filled 2018-09-07: qty 5

## 2018-09-07 MED ORDER — TRAMADOL HCL 50 MG PO TABS
50.0000 mg | ORAL_TABLET | Freq: Two times a day (BID) | ORAL | Status: DC | PRN
  Administered 2018-09-07 – 2018-09-08 (×3): 50 mg via ORAL
  Filled 2018-09-07 (×3): qty 1

## 2018-09-07 MED ORDER — INSULIN ASPART 100 UNIT/ML ~~LOC~~ SOLN
0.0000 [IU] | Freq: Three times a day (TID) | SUBCUTANEOUS | Status: DC
  Administered 2018-09-08: 3 [IU] via SUBCUTANEOUS
  Administered 2018-09-08: 17:00:00 8 [IU] via SUBCUTANEOUS
  Administered 2018-09-08 – 2018-09-09 (×2): 15 [IU] via SUBCUTANEOUS
  Administered 2018-09-09 – 2018-09-10 (×4): 3 [IU] via SUBCUTANEOUS
  Administered 2018-09-10: 8 [IU] via SUBCUTANEOUS
  Administered 2018-09-11: 3 [IU] via SUBCUTANEOUS
  Administered 2018-09-11: 8 [IU] via SUBCUTANEOUS
  Administered 2018-09-11: 3 [IU] via SUBCUTANEOUS
  Administered 2018-09-12: 11 [IU] via SUBCUTANEOUS
  Administered 2018-09-12: 8 [IU] via SUBCUTANEOUS
  Administered 2018-09-12: 3 [IU] via SUBCUTANEOUS

## 2018-09-07 MED ORDER — ONDANSETRON HCL 4 MG/2ML IJ SOLN: 4.0000 mg | Freq: Four times a day (QID) | INTRAMUSCULAR | Status: DC | PRN

## 2018-09-07 MED ORDER — ACETAMINOPHEN 325 MG PO TABS: 650.0000 mg | ORAL_TABLET | ORAL | Status: DC | PRN

## 2018-09-07 MED ORDER — SPIRONOLACTONE 12.5 MG HALF TABLET
12.5000 mg | ORAL_TABLET | Freq: Every day | ORAL | Status: DC
  Administered 2018-09-08 – 2018-09-12 (×5): 12.5 mg via ORAL
  Filled 2018-09-07 (×5): qty 1

## 2018-09-07 MED ORDER — ENOXAPARIN SODIUM 30 MG/0.3ML ~~LOC~~ SOLN
30.0000 mg | SUBCUTANEOUS | Status: DC
  Administered 2018-09-07 – 2018-09-09 (×3): 30 mg via SUBCUTANEOUS
  Filled 2018-09-07 (×3): qty 0.3

## 2018-09-07 MED ORDER — MORPHINE SULFATE (PF) 2 MG/ML IV SOLN: 2.0000 mg | INTRAVENOUS | Status: DC | PRN

## 2018-09-07 MED ORDER — INSULIN ASPART 100 UNIT/ML ~~LOC~~ SOLN
10.0000 [IU] | Freq: Once | SUBCUTANEOUS | Status: AC
  Administered 2018-09-07: 10 [IU] via SUBCUTANEOUS

## 2018-09-07 MED ORDER — TRIAZOLAM 0.125 MG PO TABS
0.2500 mg | ORAL_TABLET | Freq: Every evening | ORAL | Status: DC | PRN
  Administered 2018-09-07: 23:00:00 0.25 mg via ORAL
  Administered 2018-09-08: 0.125 mg via ORAL
  Administered 2018-09-09 – 2018-09-12 (×3): 0.25 mg via ORAL
  Filled 2018-09-07 (×5): qty 2

## 2018-09-07 MED ORDER — FUROSEMIDE 10 MG/ML IJ SOLN
80.0000 mg | Freq: Once | INTRAMUSCULAR | Status: AC
  Administered 2018-09-07: 15:00:00 80 mg via INTRAVENOUS
  Filled 2018-09-07: qty 8

## 2018-09-07 MED ORDER — SODIUM CHLORIDE 0.9 % IV BOLUS: 500.0000 mL | Freq: Once | INTRAVENOUS | Status: DC

## 2018-09-07 MED ORDER — FINASTERIDE 5 MG PO TABS
5.0000 mg | ORAL_TABLET | Freq: Every day | ORAL | Status: DC
  Administered 2018-09-07 – 2018-09-11 (×5): 5 mg via ORAL
  Filled 2018-09-07 (×6): qty 1

## 2018-09-07 MED ORDER — ALLOPURINOL 100 MG PO TABS
50.0000 mg | ORAL_TABLET | Freq: Every day | ORAL | Status: DC
  Administered 2018-09-08 – 2018-09-12 (×5): 50 mg via ORAL
  Filled 2018-09-07 (×5): qty 1

## 2018-09-07 MED ORDER — FLUOXETINE HCL 10 MG PO CAPS
10.0000 mg | ORAL_CAPSULE | Freq: Every day | ORAL | Status: DC
  Administered 2018-09-08 – 2018-09-12 (×5): 10 mg via ORAL
  Filled 2018-09-07 (×5): qty 1

## 2018-09-07 MED ORDER — DILTIAZEM HCL-DEXTROSE 100-5 MG/100ML-% IV SOLN (PREMIX)
5.0000 mg/h | INTRAVENOUS | Status: DC
  Administered 2018-09-07: 5 mg/h via INTRAVENOUS
  Administered 2018-09-08: 10 mg/h via INTRAVENOUS
  Administered 2018-09-08: 15 mg/h via INTRAVENOUS
  Filled 2018-09-07 (×5): qty 100

## 2018-09-07 MED ORDER — TRAZODONE HCL 100 MG PO TABS
100.0000 mg | ORAL_TABLET | Freq: Every day | ORAL | Status: DC
  Administered 2018-09-07 – 2018-09-11 (×5): 100 mg via ORAL
  Filled 2018-09-07 (×5): qty 1

## 2018-09-07 MED ORDER — INSULIN ASPART 100 UNIT/ML ~~LOC~~ SOLN
0.0000 [IU] | Freq: Every day | SUBCUTANEOUS | Status: DC
  Administered 2018-09-07: 3 [IU] via SUBCUTANEOUS
  Administered 2018-09-08: 5 [IU] via SUBCUTANEOUS
  Administered 2018-09-09 – 2018-09-11 (×3): 2 [IU] via SUBCUTANEOUS

## 2018-09-07 MED ORDER — PANTOPRAZOLE SODIUM 40 MG PO TBEC
40.0000 mg | DELAYED_RELEASE_TABLET | Freq: Every day | ORAL | Status: DC
  Administered 2018-09-08 – 2018-09-12 (×5): 40 mg via ORAL
  Filled 2018-09-07 (×5): qty 1

## 2018-09-07 NOTE — ED Notes (Signed)
ED TO INPATIENT HANDOFF REPORT  ED Nurse Name and Phone #: [phone]  S Name/Age/Gender Ryan [name] 85 y.o. male Room/Bed: 040C/040C  Code Status   Code Status: DNR  Home/SNF/Other Nursing Home Patient oriented to: self, place, time and situation Is this baseline? Yes   Triage Complete: Triage complete  Chief Complaint SOB  Triage Note Pt from [facility] c/o sob and feet sweeling x 2 days pt is o2 at 2 lnc having cramps in back and legs   Allergies Allergies  Allergen Reactions  . Pollen Extract-Tree Extract Other (See Comments)    HEADACHES, TIRED , DRAINAGE FROM SINUSES  . Molds & Smuts Other (See Comments)    Also dust mites causes sinus infections, h/a etc.    Level of Care/Admitting Diagnosis ED Disposition    ED Disposition Condition Comment   Admit  Hospital Area: [place] [[id number]]  Level of Care: Progressive [102]  Covid Evaluation: Confirmed COVID Negative  Diagnosis: Atrial fibrillation with RVR [facility]) [[id number]]  Admitting Physician: Karmen Bongo [[id number]]  Attending Physician: Karmen Bongo [[id number]]  Estimated length of stay: 3 - 4 days  Certification:: I certify this patient will need inpatient services for at least 2 midnights  PT Class (Do Not Modify): Inpatient [101]  PT Acc Code (Do Not Modify): Private [1]       B Medical/Surgery History Past Medical History:  Diagnosis Date  . Asthma   . Barrett's esophagus   . Benign prostatic hypertrophy   . Bilateral lower extremity edema   . Chronic fatigue   . Diverticulosis of colon (without mention of hemorrhage)   . Esophageal reflux   . Gastritis   . Gluten intolerance   . Hiatal hernia 02/10/11   Noted on CT Scan - Moderate Hiatal Hernia  . Hyperlipemia   . Hypertension   . 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 Melanoma ([place])    lymphoma  . Microcytic anemia 04/20/2011   . 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 Osteoarthritis    hands/feet,knees, NECK, BACK  . Periaortic lymphadenopathy 02/16/2011  . Personal history of colonic polyps 2004   hyperplastic Dr. Collene Mares  . Renal cyst 02/10/11    Noted on CT Scan - Bilateral Renal Cysts  . S/P radiation therapy 03/15/11 - 04/09/11   Abdominal/ Pelvic Tumor, 3600 cGy/20 Fractions   Past Surgical History:  Procedure Laterality Date  . ARTHROSCOPIC REPAIR ACL     right  . BONE MARROW ASPIRATION  02/25/11   Bone Marrow, Aspirate, Clot, and Bilateral Bx, Right PIC  . CATARACT EXTRACTION, BILATERAL    . EYE SURGERY    . HERNIA REPAIR     LEFT INGUINAL   . INSERTION OF MESH N/A 08/23/2012   Procedure: INSERTION OF MESH;  Surgeon: Madilyn Hook, DO;  Location: [facility];  Service: General;  Laterality: N/A;  . LUMBAR LAMINECTOMY/DECOMPRESSION MICRODISCECTOMY Right 12/31/2013   Procedure: RIGHT L4-5 L5-S1 LAMINECTOMY;  Surgeon: Kristeen Miss, MD;  Location: [facility];  Service: Neurosurgery;  Laterality: Right;  RIGHT L4-5 L5-S1 LAMINECTOMY  . RIGHT HEART CATH N/A 07/25/2018   Procedure: RIGHT HEART CATH;  Surgeon: Larey Dresser, MD;  Location: [place];  Service: Cardiovascular;  Laterality: N/A;  . ROTATOR CUFF REPAIR     right  . TONSILLECTOMY    . TONSILLECTOMY    .  VENTRAL HERNIA REPAIR N/A 08/23/2012   Procedure: HERNIA REPAIR VENTRAL ADULT;  Surgeon: Madilyn Hook, DO;  Location: [facility];  Service: General;  Laterality: N/A;     A IV Location/Drains/Wounds Patient Lines/Drains/Airways Status   Active Line/Drains/Airways    Name:   Placement date:   Placement time:   Site:   Days:   Peripheral IV 09/07/18 Left Antecubital   09/07/18    1228    Antecubital   less than 1          Intake/Output Last 24 hours No intake or output data in the 24 hours ending 09/07/18 1903  Labs/Imaging Results for orders placed or performed during the hospital encounter of 09/07/18 (from  the past 48 hour(s))  Comprehensive metabolic panel     Status: Abnormal   Collection Time: 09/07/18 12:41 PM  Result Value Ref Range   Sodium 123 (L) 135 - 145 mmol/L   Potassium 3.6 3.5 - 5.1 mmol/L   Chloride 74 (L) 98 - 111 mmol/L   CO2 32 22 - 32 mmol/L   Glucose, Bld 403 (H) 70 - 99 mg/dL   BUN 104 (H) 8 - 23 mg/dL   Creatinine, Ser 2.26 (H) 0.61 - 1.24 mg/dL   Calcium 8.4 (L) 8.9 - 10.3 mg/dL   Total Protein 6.8 6.5 - 8.1 g/dL   Albumin 3.5 3.5 - 5.0 g/dL   AST 28 15 - 41 U/L   ALT 27 0 - 44 U/L   Alkaline Phosphatase 125 38 - 126 U/L   Total Bilirubin 0.4 0.3 - 1.2 mg/dL   GFR calc non Af Amer 26 (L) >60 mL/min   GFR calc Af Amer 30 (L) >60 mL/min   Anion gap 17 (H) 5 - 15    Comment: Performed at [facility], [address], [place], [place]  [address]  CBC with Differential     Status: Abnormal   Collection Time: 09/07/18 12:41 PM  Result Value Ref Range   WBC 16.6 (H) 4.0 - 10.5 K/uL   RBC 5.71 4.22 - 5.81 MIL/uL   Hemoglobin 16.2 13.0 - 17.0 g/dL   HCT 50.2 39.0 - 52.0 %   MCV 87.9 80.0 - 100.0 fL   MCH 28.4 26.0 - 34.0 pg   MCHC 32.3 30.0 - 36.0 g/dL   RDW 15.3 11.5 - 15.5 %   Platelets 240 150 - 400 K/uL   nRBC 0.0 0.0 - 0.2 %   Neutrophils Relative % 84 %   Neutro Abs 13.9 (H) 1.7 - 7.7 K/uL   Lymphocytes Relative 8 %   Lymphs Abs 1.3 0.7 - 4.0 K/uL   Monocytes Relative 7 %   Monocytes Absolute 1.1 (H) 0.1 - 1.0 K/uL   Eosinophils Relative 0 %   Eosinophils Absolute 0.1 0.0 - 0.5 K/uL   Basophils Relative 0 %   Basophils Absolute 0.0 0.0 - 0.1 K/uL   Immature Granulocytes 1 %   Abs Immature Granulocytes 0.24 (H) 0.00 - 0.07 K/uL    Comment: Performed at [facility] [address] [place], [place] [address]  Brain natriuretic peptide     Status: None   Collection Time: 09/07/18 12:41 PM  Result Value Ref Range   B Natriuretic Peptide 40.6 0.0 - 100.0 pg/mL    Comment: Performed at [facility] [address] [place], [place]  [address]  Urinalysis, Routine w reflex microscopic     Status: Abnormal   Collection Time:  09/07/18 12:41 PM  Result Value Ref Range   Color, Urine STRAW (A) YELLOW   APPearance CLEAR CLEAR   Specific Gravity, Urine 1.009 1.005 - 1.030   pH 6.0 5.0 - 8.0   Glucose, UA 50 (A) NEGATIVE mg/dL   Hgb urine dipstick NEGATIVE NEGATIVE   Bilirubin Urine NEGATIVE NEGATIVE   Ketones, ur NEGATIVE NEGATIVE mg/dL   Protein, ur NEGATIVE NEGATIVE mg/dL   Nitrite NEGATIVE NEGATIVE   Leukocytes,Ua NEGATIVE NEGATIVE    Comment: Performed at [facility] [address], [place], [place] [address]  SARS Coronavirus 2 [facility] order, Performed in [facility] hospital lab) Nasopharyngeal Nasopharyngeal Swab     Status: None   Collection Time: 09/07/18 12:42 PM   Specimen: Nasopharyngeal Swab  Result Value Ref Range   SARS Coronavirus 2 NEGATIVE NEGATIVE    Comment: (NOTE) If result is NEGATIVE SARS-CoV-2 target nucleic acids are NOT DETECTED. The SARS-CoV-2 RNA is generally detectable in upper and lower  respiratory specimens during the acute phase of infection. The lowest  concentration of SARS-CoV-2 viral copies this assay can detect is 250  copies / mL. A negative result does not preclude SARS-CoV-2 infection  and should not be used as the sole basis for treatment or other  patient management decisions.  A negative result may occur with  improper specimen collection / handling, submission of specimen other  than nasopharyngeal swab, presence of viral mutation(s) within the  areas targeted by this assay, and inadequate number of viral copies  (<250 copies / mL). A negative result must be combined with clinical  observations, patient history, and epidemiological information. If result is POSITIVE SARS-CoV-2 target nucleic acids are DETECTED. The SARS-CoV-2 RNA is generally detectable in upper and lower  respiratory specimens dur ing the acute phase of infection.  Positive  results are  indicative of active infection with SARS-CoV-2.  Clinical  correlation with patient history and other diagnostic information is  necessary to determine patient infection status.  Positive results do  not rule out bacterial infection or co-infection with other viruses. If result is PRESUMPTIVE POSTIVE SARS-CoV-2 nucleic acids MAY BE PRESENT.   A presumptive positive result was obtained on the submitted specimen  and confirmed on repeat testing.  While 2019 novel coronavirus  (SARS-CoV-2) nucleic acids may be present in the submitted sample  additional confirmatory testing may be necessary for epidemiological  and / or clinical management purposes  to differentiate between  SARS-CoV-2 and other Sarbecovirus currently known to infect humans.  If clinically indicated additional testing with an alternate test  methodology [phone]) is advised. The SARS-CoV-2 RNA is generally  detectable in upper and lower respiratory sp ecimens during the acute  phase of infection. The expected result is Negative. Fact Sheet for Patients:  StrictlyIdeas.no Fact Sheet for Healthcare Providers: BankingDealers.co.za This test is not yet approved or cleared by the [place] FDA and has been authorized for detection and/or diagnosis of SARS-CoV-2 by FDA under an Emergency Use Authorization (EUA).  This EUA will remain in effect (meaning this test can be used) for the duration of the COVID-19 declaration under Section 564(b)(1) of the Act, 21 U.S.C. section 360bbb-3(b)(1), unless the authorization is terminated or revoked sooner. Performed at [facility], [place] [address] [place], [place] [address]   CBG monitoring, ED     Status: Abnormal   Collection Time: 09/07/18  3:16 PM  Result Value Ref Range   Glucose-Capillary 370 (H) 70 - 99 mg/dL  Dg Chest Port 1 View  Result Date: 09/07/2018 CLINICAL DATA:  Shortness of breath EXAM: PORTABLE CHEST 1 VIEW  COMPARISON:  July 19, 2018. FINDINGS: Again noted is cardiomegaly with a hiatal hernia. There is streaky subsegmental atelectasis seen at the left lung base as on the prior exam. No new airspace consolidation. No acute osseous abnormality. IMPRESSION: No acute cardiopulmonary process. Hiatal hernia Mild cardiomegaly Electronically Signed   By: Prudencio Pair M.D.   On: 09/07/2018 13:30    Pending Labs Unresulted Labs (From admission, onward)    Start     Ordered   09/07/18 1734  Hemoglobin A1c  Once,   STAT    Comments: To assess prior glycemic control    09/07/18 1738   09/07/18 1732  Uric acid  Add-on,   AD     09/07/18 1738          Vitals/Pain Today's Vitals   09/07/18 1821 09/07/18 1830 09/07/18 1845 09/07/18 1900  BP:  125/65 119/67 119/87  Pulse: (!) 50 (!) 54 69 75  Resp:    16  Temp:      TempSrc:      SpO2: 96% 95% 95% 96%    Isolation Precautions No active isolations  Medications Medications  diltiazem (CARDIZEM) 100 mg in dextrose 5% 11m (1 mg/mL) infusion (10 mg/hr Intravenous Rate/Dose Change 09/07/18 1842)  allopurinol (ZYLOPRIM) tablet 50 mg (has no administration in time range)  traMADol (ULTRAM) tablet 50 mg (has no administration in time range)  spironolactone (ALDACTONE) tablet 12.5 mg (has no administration in time range)  FLUoxetine (PROZAC) capsule 10 mg (has no administration in time range)  traZODone (DESYREL) tablet 100 mg (has no administration in time range)  triazolam (HALCION) tablet 0.25 mg (has no administration in time range)  pantoprazole (PROTONIX) EC tablet 40 mg (has no administration in time range)  finasteride (PROSCAR) tablet 5 mg (has no administration in time range)  pramipexole (MIRAPEX) tablet 0.75 mg (has no administration in time range)  acetaminophen (TYLENOL) tablet 650 mg (has no administration in time range)  ondansetron (ZOFRAN) injection 4 mg (has no administration in time range)  insulin aspart (novoLOG) injection 0-15  Units (has no administration in time range)  enoxaparin (LOVENOX) injection 30 mg (has no administration in time range)  insulin aspart (novoLOG) injection 0-5 Units (has no administration in time range)  morphine 2 MG/ML injection 2 mg (has no administration in time range)  insulin aspart (novoLOG) injection 10 Units (10 Units Subcutaneous Given 09/07/18 1517)  furosemide (LASIX) injection 80 mg (80 mg Intravenous Given 09/07/18 1507)  diltiazem (CARDIZEM) injection 10 mg (10 mg Intravenous Given 09/07/18 1503)    Mobility walks with device Low fall risk   Focused Assessments Cardiac Assessment Handoff:  Cardiac Rhythm: Atrial fibrillation Lab Results  Component Value Date   TROPONINI <0.03 07/19/2018   Lab Results  Component Value Date   DDIMER 0.97 (H) 01/05/2014   Does the Patient currently have chest pain? No     R Recommendations: See Admitting Provider Note  Report given to:   Additional Notes: .

## 2018-09-07 NOTE — ED Notes (Signed)
Debra RN informed pt ready for transport upstairs

## 2018-09-07 NOTE — ED Provider Notes (Signed)
[facility] Provider Note   CSN: [id number] Arrival date & time: 09/07/18  1226    History   Chief Complaint Chief Complaint  Patient presents with   Congestive Heart Failure    HPI RANCE SMITHSON is a 85 y.o. male.     Patient is a 85 year old male with a history of COPD, CHF, pulmonary hypertension, on chronic oxygen at 2 L/min who resides at [facility] facility.  He reports a 4 to 5-day history of increased fatigue.  He does not have any energy.  He states he is aching all over and has muscles and joints.  He is noted a little bit of increased shortness of breath over the last couple days and leg swelling.  He has a little bit of a cough.  No vomiting or diarrhea.  No known fevers.  No associated chest pain.     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  FINISHED THE 3RD EPIDURAL STEROID INJECTION 7/15 - DONE BY DR. Nelva Bush. - STILL HAVING BACK PAIN   Periaortic lymphadenopathy 02/16/2011   Personal history of colonic polyps 2004   hyperplastic Dr. 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   Hypoxemia 01/05/2014   Essential hypertension 01/05/2014   Hypokalemia 01/05/2014   Urinary retention 01/05/2014   Hypoxia    Lumbar radiculopathy 12/31/2013   RLS (restless legs syndrome) 09/21/2012   Chronic lower back pain 06/15/2012   Abdominal pain, unspecified site 05/19/2012   Unspecified deficiency anemia 04/07/2012   Melanoma ([place])    Non Hodgkin's lymphoma ([place])    Periaortic lymphadenopathy 02/16/2011   Barrett's esophagus 07/09/2010   Personal history of colonic polyps 05/28/2010   History of IBS 05/28/2010   Diverticulosis of colon (without mention of hemorrhage) 05/28/2010   Arthritis involving multiple sites 05/28/2010   Wheat intolerance 05/28/2010   Chronic venous insufficiency 08/19/2009   PEDAL EDEMA 08/19/2009   INSOMNIA, CHRONIC 12/24/2008   EUSTACHIAN TUBE  DYSFUNCTION, BILATERAL 12/23/2008   ERECTILE DYSFUNCTION, ORGANIC 08/01/2008   Allergic rhinitis with a nonallergic component 05/20/2008   VENTRAL HERNIA 02/12/2008   PALPITATIONS 12/25/2007   LACTOSE INTOLERANCE 10/17/2007   Hyperlipidemia 09/14/2006   HYPERTENSION 09/14/2006   BPH (benign prostatic hyperplasia) 09/14/2006   OSTEOARTHRITIS 09/14/2006   HEART MURMUR, HX OF 09/14/2006    Past Surgical History:  Procedure Laterality Date   ARTHROSCOPIC REPAIR ACL     right   BONE MARROW ASPIRATION  02/25/11   Bone Marrow, Aspirate, Clot, and Bilateral Bx, Right PIC     CATARACT EXTRACTION, BILATERAL     EYE SURGERY     HERNIA REPAIR     LEFT INGUINAL    INSERTION OF MESH N/A 08/23/2012   Procedure: INSERTION OF MESH;  Surgeon: Madilyn Hook, DO;  Location: [facility];  Service: General;  Laterality: N/A;   LUMBAR LAMINECTOMY/DECOMPRESSION MICRODISCECTOMY Right 12/31/2013   Procedure: RIGHT L4-5 L5-S1 LAMINECTOMY;  Surgeon: Kristeen Miss, MD;  Location: [facility];  Service: Neurosurgery;  Laterality: Right;  RIGHT L4-5 L5-S1 LAMINECTOMY   RIGHT HEART CATH N/A 07/25/2018   Procedure: RIGHT HEART CATH;  Surgeon: Larey Dresser, MD;  Location: [facility];  Service: Cardiovascular;  Laterality: N/A;   ROTATOR CUFF REPAIR     right   TONSILLECTOMY     TONSILLECTOMY     VENTRAL HERNIA REPAIR N/A 08/23/2012   Procedure: HERNIA REPAIR VENTRAL ADULT;  Surgeon: Madilyn Hook, DO;  Location: [facility];  Service: General;  Laterality: N/A;        Home Medications    Prior to Admission medications   Medication Sig Start Date End Date Taking? Authorizing Provider  allopurinol (ZYLOPRIM) 100 MG tablet Take 50 mg by mouth daily. 12/13/17  Yes [provider]  finasteride (PROSCAR) 5 MG tablet Take 1 tablet (5 mg total) by mouth daily. Patient taking differently: Take 5 mg by mouth at bedtime.  12/25/12  Yes Norins, Heinz Knuckles, MD  FLUoxetine (PROZAC) 10 MG capsule Take 10 mg by mouth daily. 07/06/18  Yes [provider]  omeprazole (PRILOSEC) 20 MG capsule Take 20 mg by mouth daily.   Yes [provider]  potassium chloride SA (K-DUR) 20 MEQ tablet Take 2 tablets (40 mEq total) by mouth every morning AND 1 tablet (20 mEq total) at bedtime. And take 40 meq on Saturdays with Metolazone. 08/18/18  Yes Larey Dresser, MD  pramipexole (MIRAPEX) 0.75 MG tablet Take 0.75 mg by mouth at bedtime.   Yes [provider]  spironolactone (ALDACTONE) 25 MG tablet Take 0.5 tablets (12.5 mg total) by mouth daily. 07/26/18  Yes Dhungel,  Nishant, MD  torsemide (DEMADEX) 20 MG tablet Take 100 mg by mouth 2 (two) times daily.    Yes [provider]  traMADol (ULTRAM) 50 MG tablet Take 50 mg by mouth as needed for moderate pain.    Yes [provider]  traZODone (DESYREL) 100 MG tablet Take 100 mg by mouth at bedtime. 12/13/17  Yes [provider]  triazolam (HALCION) 0.25 MG tablet Take 1 tablet by mouth at bedtime as needed for sleep.  02/25/14  Yes [provider]  metolazone (ZAROXOLYN) 2.5 MG tablet Take 1 tablet (2.5 mg total) by mouth 3 (three) times a week. Take 1 tablet every Saturday/Monday/Wednesday Patient not taking: Reported on 09/07/2018 08/18/18   Larey Dresser, MD    Family History Family History  Problem Relation Age of Onset  Heart disease Father        MI 50   Urticaria Father    Prostate cancer Brother    Prostate cancer Paternal Uncle    Prostate cancer Paternal Uncle    Prostate cancer Paternal Uncle    Hyperlipidemia Other    Stroke Other    Hypertension Other    ADD / ADHD Other    Colon cancer Neg Hx    Allergic rhinitis Neg Hx    Asthma Neg Hx    Eczema Neg Hx     Social History Social History   Tobacco Use   Smoking status: Former Smoker    Packs/day: 1.00    Years: 35.00    Pack years: 35.00    Types: Pipe, Cigarettes    Quit date: 02/23/1992    Years since quitting: 26.5   Smokeless tobacco: Never Used   Tobacco comment: quit 20 years ago  Substance Use Topics   Alcohol use: Yes    Comment: 2 drinks daily scotch  AND WINE WITH SUPPER   Drug use: No     Allergies   Pollen extract-tree extract and Molds & smuts   Review of Systems Review of Systems  Constitutional: Positive for fatigue. Negative for chills, diaphoresis and fever.  HENT: Negative for congestion, rhinorrhea and sneezing.   Eyes: Negative.   Respiratory: Positive for cough and shortness of breath. Negative for chest tightness.   Cardiovascular:  Positive for leg swelling. Negative for chest pain.  Gastrointestinal: Negative for abdominal pain, blood in stool, diarrhea, nausea and vomiting.  Genitourinary: Negative for difficulty urinating, flank pain, frequency and hematuria.  Musculoskeletal: Positive for arthralgias and myalgias. Negative for back pain.  Skin: Negative for rash.  Neurological: Negative for dizziness, speech difficulty, weakness, numbness and headaches.     Physical Exam Updated Vital Signs BP 114/86    Pulse (!) 59    Temp 98.4 F (36.9 C) (Oral)    Resp (!) 28    SpO2 94%   Physical Exam Constitutional:      Appearance: He is well-developed.  HENT:     Head: Normocephalic and atraumatic.  Eyes:     Pupils: Pupils are equal, round, and reactive to light.  Neck:     Musculoskeletal: Normal range of motion and neck supple.  Cardiovascular:     Rate and Rhythm: Normal rate and regular rhythm.     Heart sounds: Normal heart sounds.  Pulmonary:     Effort: Pulmonary effort is normal. No respiratory distress.     Breath sounds: Normal breath sounds. No wheezing or rales.  Chest:     Chest wall: No tenderness.  Abdominal:     General: Bowel sounds are normal.     Palpations: Abdomen is soft.     Tenderness: There is no abdominal tenderness. There is no guarding or rebound.  Musculoskeletal: Normal range of motion.        General: Swelling present.     Right lower leg: Edema present.     Left lower leg: Edema present.  Lymphadenopathy:     Cervical: No cervical adenopathy.  Skin:    General: Skin is warm and dry.     Findings: No rash.  Neurological:     Mental Status: He is alert and oriented to person, place, and time.      ED Treatments / Results  Labs (all labs ordered are listed, but only abnormal results are displayed) Labs Reviewed  COMPREHENSIVE METABOLIC PANEL -  Abnormal; Notable for the following components:      Result Value   Sodium 123 (*)    Chloride 74 (*)    Glucose, Bld  403 (*)    BUN 104 (*)    Creatinine, Ser 2.26 (*)    Calcium 8.4 (*)    GFR calc non Af Amer 26 (*)    GFR calc Af Amer 30 (*)    Anion gap 17 (*)    All other components within normal limits  CBC WITH DIFFERENTIAL/PLATELET - Abnormal; Notable for the following components:   WBC 16.6 (*)    Neutro Abs 13.9 (*)    Monocytes Absolute 1.1 (*)    Abs Immature Granulocytes 0.24 (*)    All other components within normal limits  URINALYSIS, ROUTINE W REFLEX MICROSCOPIC - Abnormal; Notable for the following components:   Color, Urine STRAW (*)    Glucose, UA 50 (*)    All other components within normal limits  CBG MONITORING, ED - Abnormal; Notable for the following components:   Glucose-Capillary 370 (*)    All other components within normal limits  SARS CORONAVIRUS 2 (HOSPITAL ORDER, [facility])  BRAIN NATRIURETIC PEPTIDE    EKG EKG Interpretation  Date/Time:  Thursday September 07 2018 13:43:47 EDT Ventricular Rate:  136 PR Interval:    QRS Duration: 114 QT Interval:  324 QTC Calculation: 488 R Axis:   111 Text Interpretation:  Atrial fibrillation Incomplete right bundle branch block Borderline prolonged QT interval Confirmed by Malvin Johns [phone]) on 09/07/2018 1:56:04 PM   Radiology Dg Chest Port 1 View  Result Date: 09/07/2018 CLINICAL DATA:  Shortness of breath EXAM: PORTABLE CHEST 1 VIEW COMPARISON:  July 19, 2018. FINDINGS: Again noted is cardiomegaly with a hiatal hernia. There is streaky subsegmental atelectasis seen at the left lung base as on the prior exam. No new airspace consolidation. No acute osseous abnormality. IMPRESSION: No acute cardiopulmonary process. Hiatal hernia Mild cardiomegaly Electronically Signed   By: Prudencio Pair M.D.   On: 09/07/2018 13:30    Procedures Procedures (including critical care time)  Medications Ordered in ED Medications  diltiazem (CARDIZEM) 100 mg in dextrose 5% 184m (1 mg/mL) infusion (has no  administration in time range)  insulin aspart (novoLOG) injection 10 Units (10 Units Subcutaneous Given 09/07/18 1517)  furosemide (LASIX) injection 80 mg (80 mg Intravenous Given 09/07/18 1507)  diltiazem (CARDIZEM) injection 10 mg (10 mg Intravenous Given 09/07/18 1503)     Initial Impression / Assessment and Plan / ED Course  I have reviewed the triage vital signs and the nursing notes.  Pertinent labs & imaging results that were available during my care of the patient were reviewed by me and considered in my medical decision making (see chart for details).        Patient is 85year old male who presents with shortness of breath.  He states he has had about a 3 to 4 pound weight gain over the last couple days and has had some increased swelling in his lower extremities.  He is also been more short of breath.  Chest x-ray is clear without evidence of fluid or pneumonia.  His Covid test is negative.  His labs show an acute kidney injury as well as significant hyperglycemia.  His BMP was normal.  However on lab review, he was recently admitted in June for similar episode and his BNP was normal at that time.  He seems to present with more  of a right-sided heart failure.  He improved after diuresis on his last hospitalization.  It was recommended to give him Lasix.  We will go ahead and give him some Lasix here.  This also should improve his hyponatremia which is likely from fluid overload.  He was given some insulin for his hyperglycemia.  He did go into atrial fibrillation while in the ED with a heart rate in the 120s to 140s.  He was given some Cardizem for rate control. He was started on a cardizem drip.  Is unclear whether he has a history of this.  I did see and noted on his nursing home paperwork of a history of atrial fibrillation although I do not see that he is on any medications for this and I did not see any mention of it and the last cardiology notes.  The patient denies any history of atrial  fibrillation.  Given his significant above issues, I will consult unassigned medicine for admission.  His PCP is Dr. Melford Aase at [facility] in [place].  I spoke with Dr. Lorin Mercy with unassigned medicine who will admit the pt.  I did speak with the cards master to have cardiology see him as well.   CRITICAL CARE Performed by: Malvin Johns Total critical care time: 60 minutes Critical care time was exclusive of separately billable procedures and treating other patients. Critical care was necessary to treat or prevent imminent or life-threatening deterioration. Critical care was time spent personally by me on the following activities: development of treatment plan with patient and/or surrogate as well as nursing, discussions with consultants, evaluation of patient's response to treatment, examination of patient, obtaining history from patient or surrogate, ordering and performing treatments and interventions, ordering and review of laboratory studies, ordering and review of radiographic studies, pulse oximetry and re-evaluation of patient's condition.    Final Clinical Impressions(s) / ED Diagnoses   Final diagnoses:  Acute on chronic right-sided congestive heart failure (HCC)  Hyperglycemia  AKI (acute kidney injury) ([place])  Hyponatremia  Atrial fibrillation, unspecified type [facility])    ED Discharge Orders    None       Malvin Johns, MD 09/07/18 1626

## 2018-09-07 NOTE — Consult Note (Addendum)
Cardiology Consultation:   Patient ID: Ryan [name]; [id number]; Jun 16, 1931   Admit date: 09/07/2018 Date of Consult: 09/07/2018  Primary Care Provider: Chesley Noon, MD Primary Cardiologist: [name], MD  Primary Electrophysiologist:  None  Advanced Heart Failure: Loralie Champagne, MD    Patient Profile:   Ryan [name] is a 85 y.o. 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 fib,?CHF at the request of Dr Lorin Mercy.  History of Present Illness:   Ryan [name] lives at [facility].  He is in independent living, but has nursing help with medications and other things.  I spoke to the nurse on the phone.  She reports a steady decline recently.  He has had poor appetite, decreased p.o. intake.  He has been getting weaker.  He fell recently, but no acute injuries.  Last week, he had 3 molars removed.  After the dental surgery, he was on soft foods.  Over the weekend, he had some nausea, vomiting, and diarrhea.  He has been more short of breath with exertion recently.  His lower extremity edema is better.  He denies orthopnea, or PND.  The nurse is not aware of any PND.  He has not had fevers or chills reported.  Ryan [name] is not sure what day it is.  He complains of severe cramping pain in his calves and legs.  This is been going on a while, but it is worse today.  From the [facility] with paperwork, it is not clear, but that may be why he came in.  He denies chest pain.  His heart rate is extremely high with spikes up into the 160s.  He has no palpitations and is not aware of that.  He reports weakness, but denies presyncope.  He has fallen once (per the nurse) and twice (per the patient).  He states that he would get weak, and his vision would decrease.  He would reach for something to steady himself and since he could not see  very well, he would miss it and fall.  He thinks the fall might be due to his neuropathy.  This is a chronic problem, but he feels it is a little bit worse recently.  His feet will feel cold and numb.  He has no sensation in his feet except for cold.   Past Medical History:  Diagnosis Date  . Asthma   . Barrett's esophagus   . Benign prostatic hypertrophy   . Bilateral lower extremity edema   . Chronic fatigue   . Diverticulosis of colon (without mention of hemorrhage)   . Esophageal reflux   . Gastritis   . Gluten intolerance   . Hiatal hernia 02/10/11   Noted on CT Scan - Moderate Hiatal Hernia  . Hyperlipemia   . Hypertension   . Lymphoma of lymph nodes in pelvis ([place]) 03/03/2011    Large Right Retroperitoneal Mass  . Melanoma ([place])    lymphoma  . Microcytic anemia 04/20/2011   . 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 Osteoarthritis    hands/feet,knees, NECK, BACK  . Periaortic lymphadenopathy 02/16/2011  . Personal history of colonic polyps 2004   hyperplastic Dr. Collene Mares  . Renal cyst 02/10/11    Noted on CT Scan - Bilateral Renal Cysts  .  S/P radiation therapy 03/15/11 - 04/09/11   Abdominal/ Pelvic Tumor, 3600 cGy/20 Fractions    Past Surgical History:  Procedure Laterality Date  . ARTHROSCOPIC REPAIR ACL     right  . BONE MARROW ASPIRATION  02/25/11   Bone Marrow, Aspirate, Clot, and Bilateral Bx, Right PIC  . CATARACT EXTRACTION, BILATERAL    . EYE SURGERY    . HERNIA REPAIR     LEFT INGUINAL   . INSERTION OF MESH N/A 08/23/2012   Procedure: INSERTION OF MESH;  Surgeon: Madilyn Hook, DO;  Location: [facility];  Service: General;  Laterality: N/A;  . LUMBAR LAMINECTOMY/DECOMPRESSION MICRODISCECTOMY Right 12/31/2013   Procedure: RIGHT L4-5 L5-S1 LAMINECTOMY;  Surgeon: Kristeen Miss, MD;  Location: [place] [facility];  Service: Neurosurgery;  Laterality: Right;  RIGHT L4-5 L5-S1 LAMINECTOMY  . RIGHT HEART CATH N/A 07/25/2018    Procedure: RIGHT HEART CATH;  Surgeon: Larey Dresser, MD;  Location: [place] [facility];  Service: Cardiovascular;  Laterality: N/A;  . ROTATOR CUFF REPAIR     right  . TONSILLECTOMY    . TONSILLECTOMY    . VENTRAL HERNIA REPAIR N/A 08/23/2012   Procedure: HERNIA REPAIR VENTRAL ADULT;  Surgeon: Madilyn Hook, DO;  Location: [facility];  Service: General;  Laterality: N/A;     Prior to Admission medications   Medication Sig Start Date End Date Taking? Authorizing Provider  allopurinol (ZYLOPRIM) 100 MG tablet Take 50 mg by mouth daily. 12/13/17  Yes [provider]  finasteride (PROSCAR) 5 MG tablet Take 1 tablet (5 mg total) by mouth daily. Patient taking differently: Take 5 mg by mouth at bedtime.  12/25/12  Yes Norins, Heinz Knuckles, MD  FLUoxetine (PROZAC) 10 MG capsule Take 10 mg by mouth daily. 07/06/18  Yes [provider]  omeprazole (PRILOSEC) 20 MG capsule Take 20 mg by mouth daily.   Yes [provider]  potassium chloride SA (K-DUR) 20 MEQ tablet Take 2 tablets (40 mEq total) by mouth every morning AND 1 tablet (20 mEq total) at bedtime. And take 40 meq on Saturdays with Metolazone. 08/18/18  Yes Larey Dresser, MD  pramipexole (MIRAPEX) 0.75 MG tablet Take 0.75 mg by mouth at bedtime.   Yes [provider]  spironolactone (ALDACTONE) 25 MG tablet Take 0.5 tablets (12.5 mg total) by mouth daily. 07/26/18  Yes Dhungel, Nishant, MD  torsemide (DEMADEX) 20 MG tablet Take 100 mg by mouth 2 (two) times daily.    Yes [provider]  traMADol (ULTRAM) 50 MG tablet Take 50 mg by mouth as needed for moderate pain.    Yes [provider]  traZODone (DESYREL) 100 MG tablet Take 100 mg by mouth at bedtime. 12/13/17  Yes [provider]  triazolam (HALCION) 0.25 MG tablet Take 1 tablet by mouth at bedtime as needed for sleep.  02/25/14  Yes [provider]    Inpatient Medications: Scheduled Meds: . [START ON 09/08/2018]  allopurinol  50 mg Oral Daily  . enoxaparin (LOVENOX) injection  30 mg Subcutaneous Q24H  . finasteride  5 mg Oral QHS  . [START ON 09/08/2018] FLUoxetine  10 mg Oral Daily  . [START ON 09/08/2018] insulin aspart  0-15 Units Subcutaneous TID WC  . insulin aspart  0-5 Units Subcutaneous QHS  . [START ON 09/08/2018] pantoprazole  40 mg Oral Daily  . pramipexole  0.75 mg Oral QHS  . [START ON 09/08/2018] spironolactone  12.5 mg Oral Daily  . traZODone  100 mg  Oral QHS   Continuous Infusions: . diltiazem (CARDIZEM) infusion 10 mg/hr (09/07/18 1842)   PRN Meds: acetaminophen, morphine injection, ondansetron (ZOFRAN) IV, traMADol, triazolam  Allergies:    Allergies  Allergen Reactions  . Pollen Extract-Tree Extract Other (See Comments)    HEADACHES, TIRED , DRAINAGE FROM SINUSES  . Molds & Smuts Other (See Comments)    Also dust mites causes sinus infections, h/a etc.    Social History:   Social History   Socioeconomic History  . Marital status: Married    Spouse name: Not on file  . Number of children: 2  . Years of education: Not on file  . Highest education level: Not on file  Occupational History  . 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 Financial resource strain: Not on file  . Food insecurity    Worry: Not on file    Inability: Not on file  . Transportation needs    Medical: Not on file    Non-medical: Not on file  Tobacco Use  . Smoking status: Former Smoker    Packs/day: 1.00    Years: 35.00    Pack years: 35.00    Types: Pipe, Cigarettes    Quit date: 02/23/1992    Years since quitting: 26.5  . Smokeless tobacco: Never Used  . Tobacco comment: quit 20 years ago  Substance and Sexual Activity  . Alcohol use: Yes    Comment: 2 drinks daily scotch  AND WINE WITH SUPPER  . Drug use: No  . Sexual activity: Not on file  Lifestyle  . Physical activity    Days per week: Not on file    Minutes per session: Not on file  .  Stress: Not on file  Relationships  . Social Herbalist on phone: Not on file    Gets together: Not on file    Attends religious service: Not on file    Active member of club or organization: Not on file    Attends meetings of clubs or organizations: Not on file    Relationship status: Not on file  . Intimate partner violence    Fear of current or ex partner: Not on file    Emotionally abused: Not on file    Physically abused: Not on file    Forced sexual activity: Not on file  Other Topics Concern  . Not on file  Social History Narrative   Married - second marriage   He has two children   Retired Horticulturist, commercial Professor   Currently teaches pottery making, 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[facility]   Former Smoker quit 12 years ago- smoked for 35 years   Alcohol use- yes    Family History:   Family History  Problem Relation Age of Onset  . Heart disease Father        MI 75  . Urticaria Father   . Prostate cancer Brother   . Prostate cancer Paternal Uncle   . Prostate cancer Paternal Uncle   . Prostate cancer Paternal Uncle   . Hyperlipidemia Other   . Stroke Other   . Hypertension Other   . ADD / ADHD Other   . Colon cancer Neg Hx   . Allergic rhinitis Neg Hx   . Asthma Neg Hx   . Eczema Neg Hx    Family Status:  Family Status  Relation Name Status  . Father  Deceased at age 40  Ruptured Aneurysm  . Brother  Deceased  . Mother  Deceased at age 93  . Sister  Deceased  . Sister  Deceased  . Sister  Deceased  . Annamarie Major  (Not Specified)  . Annamarie Major  (Not Specified)  . Annamarie Major  (Not Specified)  . Other  (Not Specified)  . Neg Hx  (Not Specified)    ROS:  Please see the history of present illness.  All other ROS reviewed and negative.     Physical Exam/Data:   Vitals:   09/07/18 1821 09/07/18 1830 09/07/18 1845 09/07/18 1900  BP:  125/65 119/67 119/87  Pulse: (!) 50 (!) 54 69 75  Resp:    16  Temp:      TempSrc:      SpO2: 96% 95% 95% 96%    No intake or output data in the 24 hours ending 09/07/18 1927 There were no vitals filed for this visit. There is no height or weight on file to calculate BMI.  General:  Well nourished, elderly obese male, in no acute distress HEENT: normal Lymph: no adenopathy Neck: no JVD seen, but has positive hepatojugular reflux Endocrine:  No thryomegaly Vascular: No carotid bruits; upper extremity pulses 2+, feet are cool and lower extremity pulses are decreased, capillary refill in both feet is decreased Cardiac:  normal S1, S2; RRR; no murmur  Lungs: Rales bases bilaterally, no wheezing, rhonchi   Abd: soft, tender right upper quadrant, no hepatomegaly  Ext: no edema Musculoskeletal:  No deformities, BUE and BLE strength weak but equal Skin: warm and dry  Neuro:  CNs 2-12 intact, no focal abnormalities noted, alert and oriented x2 Psych:  Normal affect   EKG:  The EKG was personally reviewed and demonstrates: MAT with heart rate 136 Telemetry:  Telemetry was personally reviewed and demonstrates: MAT with periods of narrow complex tachycardia  Relevant CV Studies:  ECHO: 07/20/2018  1. The left ventricle has normal systolic function with an ejection fraction of 60-65%. The cavity size was normal. There is mildly increased left ventricular wall thickness. Left ventricular diastolic Doppler parameters are consistent with impaired  relaxation.  2. The right ventricle has normal systolic function. The cavity was normal. There is no increase in right ventricular wall thickness.  3. There is moderate mitral annular calcification present.  CATH: 07/25/2018 1. Optimized right and left heart filling pressures.  2. Moderate pulmonary arterial hypertension with PVR 5.35 WU, comparable to prior RHC.  3. Low but not markedly low cardiac output.  4. mean RA 10, PASP 60 with mean 42, mean PCWP 11, CI 3, PVR 5.1 WU.   Laboratory Data:  Chemistry Recent Labs  Lab 09/07/18 1241  NA 123*  K 3.6   CL 74*  CO2 32  GLUCOSE 403*  BUN 104*  CREATININE 2.26*  CALCIUM 8.4*  GFRNONAA 26*  GFRAA 30*  ANIONGAP 17*    Lab Results  Component Value Date   ALT 27 09/07/2018   AST 28 09/07/2018   ALKPHOS 125 09/07/2018   BILITOT 0.4 09/07/2018   Hematology Recent Labs  Lab 09/07/18 1241  WBC 16.6*  RBC 5.71  HGB 16.2  HCT 50.2  MCV 87.9  MCH 28.4  MCHC 32.3  RDW 15.3  PLT 240   Cardiac Enzymes High Sensitivity Troponin:  No results for input(s): TROPONINIHS in the last 720 hours.     BNP Recent Labs  Lab 09/07/18 1241  BNP 40.6    TSH:  Lab  Results  Component Value Date   TSH 1.444 06/11/2011   Lipids: Lab Results  Component Value Date   CHOL 188 04/01/2014   HDL 52 04/01/2014   LDLCALC 103 (H) 04/01/2014   LDLDIRECT 165.7 12/23/2008   TRIG 166 (H) 04/01/2014   CHOLHDL 3.6 04/01/2014   Magnesium: No results found for: MG   Radiology/Studies:  Dg Chest Port 1 View  Result Date: 09/07/2018 CLINICAL DATA:  Shortness of breath EXAM: PORTABLE CHEST 1 VIEW COMPARISON:  July 19, 2018. FINDINGS: Again noted is cardiomegaly with a hiatal hernia. There is streaky subsegmental atelectasis seen at the left lung base as on the prior exam. No new airspace consolidation. No acute osseous abnormality. IMPRESSION: No acute cardiopulmonary process. Hiatal hernia Mild cardiomegaly Electronically Signed   By: Prudencio Pair M.D.   On: 09/07/2018 13:30    Assessment and Plan:   1.  Tachycardia: - Dr. Percival Spanish reviewed the ECG and believes it is multifocal atrial tachycardia - Elevated heart rate is likely secondary to intravascular volume depletion, defer decision on hydration to MD - he was not on a beta-blocker prior to admission - No indication for systemic anticoagulation  2.  Chronic diastolic CHF: - He has a few rales in his bases, but his volume status is generally good -BNP is within normal limits, which it generally is - Upon review of the records from [facility], he has lost 14 pounds in the last 2-3 weeks -His p.o. intake is reported to have been poor -His nurse has been giving him his designated medications of torsemide 100 mg twice daily and metolazone 2.5 mg on Monday Wednesday and Friday - Hold diuretic therapy for now -Dr. Claris Gladden team to see in a.m.  3.  Acute on chronic CKD: -BUN of 104 is approximately twice what it normally is. -Creatinine 2.26 is approximately a half point above what it usually is. - Defer hydration to MD -Follow closely - Because of the dehydration, he could have had orthostatic weakness leading to falls -Check orthostatic vital signs  Otherwise, per IM Principal Problem:   Atrial fibrillation with RVR ([place]) Active Problems:   Hyperlipidemia   Essential hypertension   COPD ([place])   Leg pain   For questions or updates, please contact [facility] Please consult [email or web address] for contact info under Cardiology/STEMI.   Signed, Rosaria Ferries, PA-C  09/07/2018 7:27 PM  History and all data above reviewed.  Patient examined.  I agree with the findings as above.  The patient presents with essentially diffuse body aches, weakness, falls and decreased PO intake.  In late July, after review of the HF notes, he had his Torsemide increased.  He called today with a report of a 12 lb weight gain.  However, he reports that his moderate lower extremity edema is much better than previous.  He is found to have increased creatinine.   The patient exam reveals XAJ:OINOMVEHM, distant heart sounds  ,  Lungs:  No crackles  ,  Abd: distended.  Non tender.  No rebound no guarding, Ext Moderate bilateral edema  .  All available labs, radiology testing, previous records reviewed. Agree with documented assessment and plan. ACUTE DIASTOLIC HF:  This is a very difficult situation.  The patient has been evaluated at [facility] and had recent cath per Dr. Aundra Dubin with results as above.  At this point he I think he has decreased intravascular  volume.  His predominant complaints are not dyspnea or edema but rather weakness, anorexia,  confusion.  Increased creat would support intravascular volume depletion.  I would suggest holding Torsemide.  Arrhythmia:  This does not appear to be atrial fib.  Rather this is sinus tach with MAT.  He was started on IV Cardizem.   However, I would not continue this.  This should improve as his intravascular volume improves.    Ryan [name]  8:41 PM  09/07/2018

## 2018-09-07 NOTE — Telephone Encounter (Signed)
Patient called triage to report  increase in SOB  decrease in B/P (no readings to review at the time of call)  increase in edema (BLE)  up 12 lbs overnight (weight 8/5 203)  decreased UOP (reports to not making urine in the past 48 hrs ~ 1 cup)  increase in weakness and leg cramps that caused him to fall overnight (w/o injury)  *note patient is noticeably confused during phone call with SOB* No missed doses of medications   Reviewed with Amy Clegg,NP Patient should report to ER for further evaluation     Detailed message left for wife @ 1122  Spoke with patient and reports he will call the ambulance for transport to ER

## 2018-09-07 NOTE — ED Notes (Signed)
Report given by previous RN. Environmental to clean room. Floor to call when bed ready.

## 2018-09-07 NOTE — ED Notes (Signed)
[place], pts daughter please update

## 2018-09-07 NOTE — ED Triage Notes (Signed)
Pt from [facility] home c/o sob and feet sweeling x 2 days pt is o2 at 2 lnc having cramps in back and legs

## 2018-09-07 NOTE — H&P (Signed)
History and Physical    Ryan [name] MWN:[id number] DOB: [birth year] DOA: 09/07/2018  PCP: Chesley Noon, MD Consultants:  [place] - cardiology; Christa See - pulmonary HTN Patient coming from: [place]; NOK: Wife  Chief Complaint: SOB  HPI: Ryan [name] is a 85 y.o. male with medical history significant of NHL  S/p radiation therapy; HTN; COPD/pulmonary fibrosis on home O2; and HLD presenting with SOB.  He reports that he has been in extreme pain for about 3 days, especially in his ankles.  He got very weak and fell down in the night.  He is always SOB with exertion and this was unchanged.  His pain was so severe that he may have had increased SOB.  No fever.  +dry mouth, thrush.  He hasn't been able to eat for several days, living mostly on juice and fruit.  He hasn't been able to pee in the last 2 days.  No chest pain, has not noticed palpitations or a rapiid heart rate.  He has chronic LBP but the last few days the pain has been all over his body.  He was wearing thigh-high compression socks and this became extraordinarily painful and so he didn't put them on today.  When he was admitted in June, he wasn't nearly as uncomfortable.  He fell 2 days ago and was unable to get up.  He didn't think he needed to come to the ER but he didn't realize how weak he was.  He had 3 teeth removed a week ago and he is taking an antibiotic for this (?PCN).   ED Course:  CHF with h/o the same.  Went into afib while in the ER with some RVR - given 10 mg Cardizem with some improvement for 110s.  Likely needs drip.  Low BNP but similar to prior admission in June.  Will consult cardiology.  Review of Systems: As per HPI; otherwise review of systems reviewed and negative.   Ambulatory Status:  Ambulated without assistance until 2 days ago  Past Medical History:  Diagnosis Date  . Asthma   . Barrett's esophagus   . Benign prostatic hypertrophy   . Bilateral lower extremity edema   .  Chronic fatigue   . Diverticulosis of colon (without mention of hemorrhage)   . Environmental allergies   . Esophageal reflux   . Gastritis   . Gluten intolerance   . Hiatal hernia 02/10/11   Noted on CT Scan - Moderate Hiatal Hernia  . Hyperlipemia   . Hypertension   . Lactose intolerance   . Lymphoma of lymph nodes in pelvis ([place]) 03/03/2011    Large Right Retroperitoneal Mass  . Melanoma ([place])    lymphoma  . Microcytic anemia 04/20/2011   . 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 Osteoarthritis    hands/feet,knees, NECK, BACK  . Pain    LOWER BACK PAIN - DDD, SCOLIOSIS, BONE SPUR.  FINISHED THE 3RD EPIDURAL STEROID INJECTION 7/15 - DONE BY DR. Nelva Bush. - STILL HAVING BACK PAIN  . Periaortic lymphadenopathy 02/16/2011  . Personal history of colonic polyps 2004   hyperplastic Dr. Collene Mares  . Renal cyst 02/10/11    Noted on CT Scan - Bilateral Renal Cysts  . S/P radiation therapy 03/15/11 - 04/09/11   Abdominal/ Pelvic Tumor, 3600 cGy/20 Fractions  . Sleep disorder    DOES NOT SLEEP WELL    Past Surgical History:  Procedure Laterality Date  .  ARTHROSCOPIC REPAIR ACL     right  . BONE MARROW ASPIRATION  02/25/11   Bone Marrow, Aspirate, Clot, and Bilateral Bx, Right PIC  . CATARACT EXTRACTION, BILATERAL    . EYE SURGERY    . HERNIA REPAIR     LEFT INGUINAL   . INSERTION OF MESH N/A 08/23/2012   Procedure: INSERTION OF MESH;  Surgeon: Madilyn Hook, DO;  Location: [facility];  Service: General;  Laterality: N/A;  . LUMBAR LAMINECTOMY/DECOMPRESSION MICRODISCECTOMY Right 12/31/2013   Procedure: RIGHT L4-5 L5-S1 LAMINECTOMY;  Surgeon: Kristeen Miss, MD;  Location: [facility];  Service: Neurosurgery;  Laterality: Right;  RIGHT L4-5 L5-S1 LAMINECTOMY  . RIGHT HEART CATH N/A 07/25/2018   Procedure: RIGHT HEART CATH;  Surgeon: Larey Dresser, MD;  Location: [facility];  Service: Cardiovascular;  Laterality: N/A;  . ROTATOR CUFF  REPAIR     right  . TONSILLECTOMY    . TONSILLECTOMY    . VENTRAL HERNIA REPAIR N/A 08/23/2012   Procedure: HERNIA REPAIR VENTRAL ADULT;  Surgeon: Madilyn Hook, DO;  Location: [facility];  Service: General;  Laterality: N/A;    Social History   Socioeconomic History  . Marital status: Married    Spouse name: Not on file  . Number of children: 2  . Years of education: Not on file  . Highest education level: Not on file  Occupational History  . 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 Financial resource strain: Not on file  . Food insecurity    Worry: Not on file    Inability: Not on file  . Transportation needs    Medical: Not on file    Non-medical: Not on file  Tobacco Use  . Smoking status: Former Smoker    Packs/day: 1.00    Years: 35.00    Pack years: 35.00    Types: Pipe, Cigarettes    Quit date: 02/23/1992    Years since quitting: 26.5  . Smokeless tobacco: Never Used  . Tobacco comment: quit 20 years ago  Substance and Sexual Activity  . Alcohol use: Yes    Comment: 2 drinks daily scotch  AND WINE WITH SUPPER  . Drug use: No  . Sexual activity: Not on file  Lifestyle  . Physical activity    Days per week: Not on file    Minutes per session: Not on file  . Stress: Not on file  Relationships  . Social Herbalist on phone: Not on file    Gets together: Not on file    Attends religious service: Not on file    Active member of club or organization: Not on file    Attends meetings of clubs or organizations: Not on file    Relationship status: Not on file  . Intimate partner violence    Fear of current or ex partner: Not on file    Emotionally abused: Not on file    Physically abused: Not on file    Forced sexual activity: Not on file  Other Topics Concern  . Not on file  Social History Narrative   Married - second marriage   He has two children   Retired Horticulturist, commercial Professor   Currently teaches pottery  making, 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[facility]   Former Smoker quit 12 years ago- smoked for 35 years   Alcohol use- yes    Allergies  Allergen Reactions  . Pollen  Extract-Tree Extract Other (See Comments)    HEADACHES, TIRED , DRAINAGE FROM SINUSES  . Molds & Smuts Other (See Comments)    Also dust mites causes sinus infections, h/a etc.    Family History  Problem Relation Age of Onset  . Heart disease Father        MI 34  . Urticaria Father   . Prostate cancer Brother   . Prostate cancer Paternal Uncle   . Prostate cancer Paternal Uncle   . Prostate cancer Paternal Uncle   . Hyperlipidemia Other   . Stroke Other   . Hypertension Other   . ADD / ADHD Other   . Colon cancer Neg Hx   . Allergic rhinitis Neg Hx   . Asthma Neg Hx   . Eczema Neg Hx     Prior to Admission medications   Medication Sig Start Date End Date Taking? Authorizing Provider  allopurinol (ZYLOPRIM) 100 MG tablet Take 50 mg by mouth daily. 12/13/17  Yes [provider]  finasteride (PROSCAR) 5 MG tablet Take 1 tablet (5 mg total) by mouth daily. Patient taking differently: Take 5 mg by mouth at bedtime.  12/25/12  Yes Norins, Heinz Knuckles, MD  FLUoxetine (PROZAC) 10 MG capsule Take 10 mg by mouth daily. 07/06/18  Yes [provider]  omeprazole (PRILOSEC) 20 MG capsule Take 20 mg by mouth daily.   Yes [provider]  potassium chloride SA (K-DUR) 20 MEQ tablet Take 2 tablets (40 mEq total) by mouth every morning AND 1 tablet (20 mEq total) at bedtime. And take 40 meq on Saturdays with Metolazone. 08/18/18  Yes Larey Dresser, MD  pramipexole (MIRAPEX) 0.75 MG tablet Take 0.75 mg by mouth at bedtime.   Yes [provider]  spironolactone (ALDACTONE) 25 MG tablet Take 0.5 tablets (12.5 mg total) by mouth daily. 07/26/18  Yes Dhungel, Nishant, MD  torsemide (DEMADEX) 20 MG tablet Take 100 mg by mouth 2 (two) times daily.    Yes [provider]  traMADol (ULTRAM) 50 MG tablet  Take 50 mg by mouth as needed for moderate pain.    Yes [provider]  traZODone (DESYREL) 100 MG tablet Take 100 mg by mouth at bedtime. 12/13/17  Yes [provider]  triazolam (HALCION) 0.25 MG tablet Take 1 tablet by mouth at bedtime as needed for sleep.  02/25/14  Yes [provider]  metolazone (ZAROXOLYN) 2.5 MG tablet Take 1 tablet (2.5 mg total) by mouth 3 (three) times a week. Take 1 tablet every Saturday/Monday/Wednesday Patient not taking: Reported on 09/07/2018 08/18/18   Larey Dresser, MD    Physical Exam: Vitals:   09/07/18 1815 09/07/18 1818 09/07/18 1821 09/07/18 1830  BP: (!) 111/91   125/65  Pulse:  78 (!) 50 (!) 54  Resp:      Temp:      TempSrc:      SpO2:  92% 96% 95%     . General:  Appears calm and comfortable and is NAD, reports significant pain . Eyes:  PERRL, EOMI, normal lids, iris . ENT:  grossly normal hearing, lips & tongue, mmm . Neck:  no LAD, masses or thyromegaly . Cardiovascular:  Afib with RVR to 160s, no m/r/g. 2+ LE edema which he reports is significantly better than prior. Marland Kitchen Respiratory:   CTA bilaterally with no wheezes/rales/rhonchi.  Normal respiratory effort. . Abdomen:  soft, NT, ND, NABS . Skin:  no rash or induration seen on limited exam .  Musculoskeletal:  grossly normal tone BUE/BLE, good ROM, no bony abnormality; superficial TTP of B LE . Lower extremity:  No LE edema.  Limited foot exam with no ulcerations.  2+ distal pulses. Marland Kitchen Psychiatric: blunted mood and affect, speech fluent and appropriate, AOx3 . Neurologic:  CN 2-12 grossly intact, moves all extremities in coordinated fashion, sensation decreased in B LE    Radiological Exams on Admission: Dg Chest Port 1 View  Result Date: 09/07/2018 CLINICAL DATA:  Shortness of breath EXAM: PORTABLE CHEST 1 VIEW COMPARISON:  July 19, 2018. FINDINGS: Again noted is cardiomegaly with a hiatal hernia. There is streaky subsegmental atelectasis seen at the left lung  base as on the prior exam. No new airspace consolidation. No acute osseous abnormality. IMPRESSION: No acute cardiopulmonary process. Hiatal hernia Mild cardiomegaly Electronically Signed   By: Prudencio Pair M.D.   On: 09/07/2018 13:30    EKG: Independently reviewed.  Sinus tachycardia with irregular rhythm vs. afib with rate 112; nonspecific ST changes that do not appear significantly different from last tracing  Labs on Admission: I have personally reviewed the available labs and imaging studies at the time of the admission.  Pertinent labs:   Na++ 123 Glucose 403 BUN 104/Creatinine 2.26/GFR 26; prior 59/1.86/32 on 7/24; 53/1.67/37 on 7/17 Anion gap 17 BNP 40.6 WBC 16.6 UA: 50 glucose COVID negative  Assessment/Plan Principal Problem:   Atrial fibrillation with RVR (HCC) Active Problems:   Hyperlipidemia   Essential hypertension   COPD (HCC)   Leg pain   Afib (or other aberrant rhythm) with RVR   -Patient reports fatigue and falls, possibly related to RVR -He reports h/o dysrhythmia before but is clear that this was not thought to be related to afib -In review of today's EKG as well as priors, there do appear to be p waves present although not clearly of the same morphology and not even clearly always present -He is quite clearly in RVR with sustained HR 120-140 throughout my evaluation and transient increases up to 160s -He was started on 5 mg cardizem drip just prior to my evaluation so it has not had time to clearly work -His BP is borderline low and so this is also a consideration -I have personally discussed with Calton Golds from cardiology and they are seeing the patient now for further consideration  -If medication is not sufficient, electrical cardioversion may need to be considered - but he is not on [facility]. -Will admit to SDU for Diltiazem drip as per protocol with plan to transition to PO Diltiazem once heart rate is controlled.   -Patient is not on any anti-coagulants  at home. Patient has high risk for fall and therefore is not a good candidate for anticoagulation.   B leg pain -Patient actually presented with concern for myalgias and leg pains -He has been taking allopurinol and his pain may be c/w systemic gout -Uric acid level is pending -Resume home tramadol -Will add prn morphine -Continue Mirapex -He reports such significant pain that he would consider palliative care if we are unable to help him feel better  Chronic respiratory failure from COPD/pulmonary fibrosis -He is on home O2 and does not report increased SOB, cough, or O2 requirement -He has a negative CXR and normal BNP -I am not suspicious for CHF exacerbation at this time -Continue home O2  Chronic diastolic CHF -As noted above, edema actually improved from prior with normal BNP and CXR -Appears to be compensated to a little dry -Continue  Aldactone but will hold demadex and metolazone for now; doses were tweaked at last appointment on 7/24 and this may have led to mild overdiuresis -Last echo was on 6/18 and does not need to be repeated currently  HTN -Diuretics as above -Cardizem as above -He does not appear to be taking other anti-HTN medications at this time   HLD -Not taking statin therapy or other apparent medications for this issue    Note: This patient has been tested and is negative for the novel coronavirus COVID-19.  DVT prophylaxis:  Lovenox  Code Status:  DNR - confirmed with patient Family Communication: None present Disposition Plan:  Home once clinically improved Consults called: Cardiology  Admission status: Admit - It is my clinical opinion that admission to INPATIENT is reasonable and necessary because of the expectation that this patient will require hospital care that crosses at least 2 midnights to treat this condition based on the medical complexity of the problems presented.  Given the aforementioned information, the predictability of an adverse  outcome is felt to be significant.    Karmen Bongo MD [facility]   How to contact the [facility] Attending or Consulting provider [place] or covering provider during after hours [place], for this patient?  1. Check the care team in [facility] and look for a) attending/consulting [facility] provider listed and b) the [facility] team listed 2. Log into [email or web address] and use [facility]'s universal password to access. If you do not have the password, please contact the hospital operator. 3. Locate the [facility] provider you are looking for under [facility] and page to a number that you can be directly reached. 4. If you still have difficulty reaching the provider, please page the [facility] (Director on Call) for the Hospitalists listed on amion for assistance.   09/07/2018, 6:44 PM

## 2018-09-08 ENCOUNTER — Encounter (HOSPITAL_COMMUNITY): Payer: Self-pay | Admitting: *Deleted

## 2018-09-08 LAB — GLUCOSE, CAPILLARY
Glucose-Capillary: 365 mg/dL — ABNORMAL HIGH (ref 70–99)
Glucose-Capillary: 198 mg/dL — ABNORMAL HIGH (ref 70–99)
Glucose-Capillary: 263 mg/dL — ABNORMAL HIGH (ref 70–99)
Glucose-Capillary: 366 mg/dL — ABNORMAL HIGH (ref 70–99)

## 2018-09-08 LAB — HEMOGLOBIN A1C
Hgb A1c MFr Bld: 8.6 % — ABNORMAL HIGH (ref 4.8–5.6)
Mean Plasma Glucose: 200.12 mg/dL

## 2018-09-08 LAB — CBC WITH DIFFERENTIAL/PLATELET
Abs Immature Granulocytes: 0.17 10*3/uL — ABNORMAL HIGH (ref 0.00–0.07)
Basophils Absolute: 0 10*3/uL (ref 0.0–0.1)
Basophils Relative: 0 %
Eosinophils Absolute: 0.1 10*3/uL (ref 0.0–0.5)
Eosinophils Relative: 0 %
HCT: 48.8 % (ref 39.0–52.0)
Hemoglobin: 16 g/dL (ref 13.0–17.0)
Immature Granulocytes: 1 %
Lymphocytes Relative: 8 %
Lymphs Abs: 1.2 10*3/uL (ref 0.7–4.0)
MCH: 29 pg (ref 26.0–34.0)
MCHC: 32.8 g/dL (ref 30.0–36.0)
MCV: 88.4 fL (ref 80.0–100.0)
Monocytes Absolute: 1 10*3/uL (ref 0.1–1.0)
Monocytes Relative: 7 %
Neutro Abs: 12 10*3/uL — ABNORMAL HIGH (ref 1.7–7.7)
Neutrophils Relative %: 84 %
Platelets: 254 10*3/uL (ref 150–400)
RBC: 5.52 MIL/uL (ref 4.22–5.81)
RDW: 15.2 % (ref 11.5–15.5)
WBC: 14.5 10*3/uL — ABNORMAL HIGH (ref 4.0–10.5)
nRBC: 0 % (ref 0.0–0.2)

## 2018-09-08 LAB — BASIC METABOLIC PANEL
Anion gap: 16 — ABNORMAL HIGH (ref 5–15)
BUN: 93 mg/dL — ABNORMAL HIGH (ref 8–23)
CO2: 38 mmol/L — ABNORMAL HIGH (ref 22–32)
Calcium: 8.1 mg/dL — ABNORMAL LOW (ref 8.9–10.3)
Chloride: 73 mmol/L — ABNORMAL LOW (ref 98–111)
Creatinine, Ser: 1.96 mg/dL — ABNORMAL HIGH (ref 0.61–1.24)
GFR calc Af Amer: 35 mL/min — ABNORMAL LOW (ref >60)
GFR calc non Af Amer: 30 mL/min — ABNORMAL LOW (ref >60)
Glucose, Bld: 350 mg/dL — ABNORMAL HIGH (ref 70–99)
Potassium: 3.3 mmol/L — ABNORMAL LOW (ref 3.5–5.1)
Sodium: 127 mmol/L — ABNORMAL LOW (ref 135–145)

## 2018-09-08 LAB — MAGNESIUM: Magnesium: 2.5 mg/dL — ABNORMAL HIGH (ref 1.7–2.4)

## 2018-09-08 LAB — URIC ACID: Uric Acid, Serum: 13.2 mg/dL — ABNORMAL HIGH (ref 3.7–8.6)

## 2018-09-08 LAB — MRSA PCR SCREENING: MRSA by PCR: NEGATIVE

## 2018-09-08 MED ORDER — SODIUM CHLORIDE 0.9 % IV SOLN
INTRAVENOUS | Status: DC
  Administered 2018-09-08: 08:00:00 via INTRAVENOUS

## 2018-09-08 MED ORDER — POTASSIUM CHLORIDE CRYS ER 20 MEQ PO TBCR
40.0000 meq | EXTENDED_RELEASE_TABLET | Freq: Once | ORAL | Status: AC
  Administered 2018-09-08: 40 meq via ORAL
  Filled 2018-09-08: qty 2

## 2018-09-08 MED ORDER — LIVING WELL WITH DIABETES BOOK
Freq: Once | Status: AC
  Administered 2018-09-08: 12:00:00
  Filled 2018-09-08: qty 1

## 2018-09-08 MED ORDER — DILTIAZEM HCL ER COATED BEADS 240 MG PO CP24
240.0000 mg | ORAL_CAPSULE | Freq: Every day | ORAL | Status: DC
  Administered 2018-09-08 – 2018-09-09 (×2): 240 mg via ORAL
  Filled 2018-09-08 (×2): qty 1

## 2018-09-08 MED ORDER — SODIUM CHLORIDE 0.9 % IV SOLN
INTRAVENOUS | Status: DC
  Administered 2018-09-08 (×2): via INTRAVENOUS

## 2018-09-08 NOTE — Progress Notes (Signed)
Patient ID: Ryan [name], male   DOB: March 01, 1931, 85 y.o.   MRN: [id number]     Advanced Heart Failure Rounding Note  PCP-Cardiologist: [name], MD   Subjective:    BUN/creatinine trending down, getting NS at 100 cc/hr.    HR around 100, I agree that it appears to be MAT (not atrial flutter or fibrillation).  He is currently on diltiazem gtt.   He is feeling better, more awake.  Main complaint is right thigh cramp.    Objective:   Weight Range: 92.3 kg Body mass index is 31.87 kg/m.   Vital Signs:   Temp:  [98 F (36.7 C)-98.6 F (37 C)] 98.6 F (37 C) (08/07 0739) Pulse Rate:  [30-118] 96 (08/07 1101) Resp:  [15-29] 26 (08/07 1101) BP: (89-153)/(64-118) 111/80 (08/07 1101) SpO2:  [91 %-98 %] 91 % (08/07 1101) Weight:  [91.8 kg-92.3 kg] 92.3 kg (08/07 0500) Last BM Date: 09/06/18(per pt)  Weight change: Filed Weights   09/07/18 2219 09/08/18 0500  Weight: 91.8 kg 92.3 kg    Intake/Output:   Intake/Output Summary (Last 24 hours) at 09/08/2018 1150 Last data filed at 09/08/2018 0740 Gross per 24 hour  Intake 793.95 ml  Output 1650 ml  Net -856.05 ml      Physical Exam    General:  Well appearing. No resp difficulty HEENT: Normal Neck: Supple. JVP 7-8 cm. Carotids 2+ bilat; no bruits. No lymphadenopathy or thyromegaly appreciated. Cor: PMI nondisplaced. Mildly tachy, irregular rate & rhythm. No rubs, gallops or murmurs. Lungs: Clear Abdomen: Soft, nontender, nondistended. No hepatosplenomegaly. No bruits or masses. Good bowel sounds. Extremities: No cyanosis, clubbing, rash. Trace ankle edema.  Neuro: Alert & orientedx3, cranial nerves grossly intact. moves all 4 extremities w/o difficulty. Affect pleasant   Telemetry   Suspect multifocal atrial tachycardia with rate 100s.   Labs    CBC Recent Labs    09/07/18 1241 09/08/18 0831  WBC 16.6* 14.5*  NEUTROABS 13.9* 12.0*  HGB 16.2 16.0  HCT 50.2 48.8  MCV 87.9 88.4  PLT 240 623   Basic  Metabolic Panel Recent Labs    09/07/18 1241 09/08/18 0831  NA 123* 127*  K 3.6 3.3*  CL 74* 73*  CO2 32 38*  GLUCOSE 403* 350*  BUN 104* 93*  CREATININE 2.26* 1.96*  CALCIUM 8.4* 8.1*   Liver Function Tests Recent Labs    09/07/18 1241  AST 28  ALT 27  ALKPHOS 125  BILITOT 0.4  PROT 6.8  ALBUMIN 3.5   No results for input(s): LIPASE, AMYLASE in the last 72 hours. Cardiac Enzymes No results for input(s): CKTOTAL, CKMB, CKMBINDEX, TROPONINI in the last 72 hours.  BNP: BNP (last 3 results) Recent Labs    03/08/18 1859 07/19/18 1750 09/07/18 1241  BNP 40.3 36.1 40.6    ProBNP (last 3 results) No results for input(s): PROBNP in the last 8760 hours.   D-Dimer No results for input(s): DDIMER in the last 72 hours. Hemoglobin A1C Recent Labs    09/07/18 2308  HGBA1C 8.6*   Fasting Lipid Panel No results for input(s): CHOL, HDL, LDLCALC, TRIG, CHOLHDL, LDLDIRECT in the last 72 hours. Thyroid Function Tests No results for input(s): TSH, T4TOTAL, T3FREE, THYROIDAB in the last 72 hours.  Invalid input(s): FREET3  Other results:   Imaging    Dg Chest Port 1 View  Result Date: 09/07/2018 CLINICAL DATA:  Shortness of breath EXAM: PORTABLE CHEST 1 VIEW COMPARISON:  July 19, 2018. FINDINGS:  Again noted is cardiomegaly with a hiatal hernia. There is streaky subsegmental atelectasis seen at the left lung base as on the prior exam. No new airspace consolidation. No acute osseous abnormality. IMPRESSION: No acute cardiopulmonary process. Hiatal hernia Mild cardiomegaly Electronically Signed   By: Prudencio Pair M.D.   On: 09/07/2018 13:30      Medications:     Scheduled Medications: . allopurinol  50 mg Oral Daily  . diltiazem  240 mg Oral Daily  . enoxaparin (LOVENOX) injection  30 mg Subcutaneous Q24H  . finasteride  5 mg Oral QHS  . FLUoxetine  10 mg Oral Daily  . insulin aspart  0-15 Units Subcutaneous TID WC  . insulin aspart  0-5 Units Subcutaneous QHS   . living well with diabetes book   Does not apply Once  . pantoprazole  40 mg Oral Daily  . potassium chloride  40 mEq Oral Once  . pramipexole  0.75 mg Oral QHS  . spironolactone  12.5 mg Oral Daily  . traZODone  100 mg Oral QHS     Infusions: . sodium chloride 100 mL/hr at 09/08/18 0813     PRN Medications:  acetaminophen, morphine injection, ondansetron (ZOFRAN) IV, traMADol, triazolam   Assessment/Plan   1. AKI on CKD stage 3: He does not appear volume overloaded on exam.  He was on an aggressive diuretic regimen at home and had appeared to be diuretic resistant but creatinine had been remaining stable.  He tells me that he lost "15 lbs" in 1 day earlier this week and since then has been weak/lethargic. He was admitted with markedly elevated BUN and creatinine.  Renal function improving today.  - Diuretics have been held, continue to hold.  - Agree with gentle IV fluid today, but be careful given tendency for significant fluid retention. Will give NS 75 cc/hr x 10 hours then reassess.  He is eating his meals and drinking now.  2. Chronic diastolic CHF: Suspect with significant component of RV failure from pulmonary hypertension. Echo in 6/20 with EF 60-65%, RV not well-visualized but PASP 59 mmHg.RHC in 6/20 showedfilling pressures optimized, but after hospital discharge, he gained significant weight.  His diuretics had been increased significantly, up to torsemide 100 bid and metolazone three days/week with diuretic resistance.  This admission, he has AKI and does not appear volume overloaded (leg swelling much decreased since I last saw him).  I do not think he is significantly volume overloaded today.  - Careful IV fluid, gently replace K.  - Will eventually need to restart his diuretics but hold for now.  Appears that he will need lower dose.  -Can continue spironolactone 12.5 daily with hypokalemia.  3. Pulmonary hypertnesion: Patient thought to have [place] out of proportion  to his lung disease (ILD, NSIP versus UIP). RHC in 2/20 showed mean PA pressure 41, PVR 5.1 WU, CI 3. Echo in 6/20 with RV reportedly ok =>I reviewed and do not think that RV was well-visualized. Patient did not tolerate Adcirca (started at [facility]) due to edema/?cognitive effects. RHC was done again in 6/20, showed moderate pulmonary arterial hypertension with PVR 5.35 (comparable to prior).  Serological workup for PH was negative. Patient started ambrisentan 5 mg and developed significant edema/volume overload despite a high dose of torsemide.  I am concerned that ambrisentan may have been part of the reason for the peripheral edema/volume overload.  - he will stay off ambrisentan and Adcirca (?trigger for volume overload). - V/Q scan to rule  out chronic PEs pending.   - Home sleep study ordered.  - When he stable, can consider Uptravi (intolerant of Adcirca, Letairis).  4. Rhythm: I think that this is MAT (do not think atrial fibrillation or flutter).  Rate has slowed to 100s on diltiazem gtt.  - I will get an ECG to confirm rhythm.   Length of Stay: 1  Loralie Champagne, MD  09/08/2018, 11:50 AM  Advanced Heart Failure Team Pager [phone] (M-F; 7a - 4p)  Please contact [facility] for night-coverage after hours (4p -7a ) and weekends on [email or web address]

## 2018-09-08 NOTE — Progress Notes (Addendum)
Pt's wife called and requested for pt not to go back to [facility] this weekends because it's an independent living/ pt doesn't get a lot of help there. Oncoming RN was updated on it.

## 2018-09-08 NOTE — Plan of Care (Signed)
  Problem: Elimination: Goal: Will not experience complications related to urinary retention Outcome: Progressing Note: No s/s of urinary retention.  Adequate urine output.   Problem: Pain Managment: Goal: General experience of comfort will improve Outcome: Progressing Note: PO pain meds and heat packs effective for leg cramps.

## 2018-09-08 NOTE — Progress Notes (Signed)
Spoke with patient at the bedside about his new diagnosis of diabetes. States that he had never had high blood sugars in the past. States that he had been drinking a lot of juices and tonic water recently because he was so thirsty. Talked about the symptoms, physiology of diabetes, need to check blood sugars at home, the need for checking feet every day (states that his feet are numb), and what he needs to be eating. States that he would like to talk with a dietician and that he is on a low sodium, low gluten diet. Gets his meals from the facility where he lives. ([facility])  Will need to follow up with PCP for glucose control.  Ordered Living Well with Diabetes booklet, ordered dietician consult, will need home blood glucose meter, strips, and lancets prescription at discharge (order # [id number]). Will continue to monitor blood sugars while in the hospital.  Harvel Ricks RN BSN CDE Diabetes Coordinator Pager: [phone]  8am-5pm

## 2018-09-08 NOTE — Progress Notes (Signed)
PROGRESS NOTE    Ryan [name]  HMC:[id number] DOB: 10-10-1931 DOA: 09/07/2018 PCP: Chesley Noon, MD    Brief Narrative:  85 year old gentleman with history of non-Hodgkin's lymphoma status post radiation therapy, hypertension, COPD/pulmonary fibrosis on home oxygen, hyperlipidemia from assisted living facility presented to the emergency room with bilateral leg cramps and thigh pain.  Reported falls.  Poor appetite for last several days and living mostly on juice and fruit.  Patient also complained that he has not peed for 2 days.  Patient has chronic low back pain but he has been having more myalgia currently.  In the emergency room he was clinically dehydrated, had tachycardia initially thought to be rapid A. fib, turned out to be rapid atrial tachycardia.  Admitted for clinical dehydration, acute renal failure, electrolyte abnormalities and tachycardia.   Assessment & Plan:   Principal Problem:   Atrial fibrillation with RVR (HCC) Active Problems:   Hyperlipidemia   Essential hypertension   COPD (HCC)   Leg pain  Rapid atrial tachycardia: Initially thought to be rapid A. fib.  Rate controlled currently on Cardizem.  Followed by cardiology.  Will gradually taper off Cardizem when heart rate is persistently less than 100.  Acute kidney injury on chronic kidney disease stage III: Clinically hydrated with low sodium and low chloride.  Patient was given additional Lasix in the emergency room on admission. Gentle hydration over next 10 hours.  Careful monitoring for fluid overload. Holding all diuretics.  Chronic diastolic heart failure: Recent echocardiogram with normal ejection fraction.  Patient on significant high dose of torsemide 100 mg twice daily and metolazone. Gentle IV fluid and monitoring.  Pulmonary hypertension: On 2 L of oxygen.  Fairly on baseline.  Deconditioning: Work with PT OT.  COPD: Stable.  Hypokalemia: Replaced.  Will check magnesium.  Patient has  significant tachycardia, significant electrolyte abnormalities.  He will need continuous hospitalization, titration of medications.  DVT prophylaxis: Lovenox subcu Code Status: DNR Family Communication: Patient's daughter updated on the phone and all questions were answered Disposition Plan: Back to assisted living facility when improves.  Continue progressive level of care.   Consultants:   Cardiology  Procedures:   None  Antimicrobials:   None   Subjective: Patient seen and examined.  He says he has been weak and falling.  Main reason he came to hospital was because of leg pain and cramps.  No other overnight events.  Remains on 10 mg of Cardizem per hour and heart rate is 100-110. Denies any nausea vomiting.  Denies any chest pain or palpitations. He has not received any additional fluid overnight.  Objective: Vitals:   09/08/18 0739 09/08/18 0813 09/08/18 1101 09/08/18 1206  BP: (!) 89/74 96/72 111/80 126/69  Pulse: (!) 118  96 (!) 107  Resp: (!) 22 19 (!) 26 18  Temp: 98.6 F (37 C)     TempSrc: Oral     SpO2: 92%  91% 91%  Weight:      Height:        Intake/Output Summary (Last 24 hours) at 09/08/2018 1215 Last data filed at 09/08/2018 0740 Gross per 24 hour  Intake 793.95 ml  Output 1650 ml  Net -856.05 ml   Filed Weights   09/07/18 2219 09/08/18 0500  Weight: 91.8 kg 92.3 kg    Examination:  General exam: Appears calm and comfortable, chronically sick looking, on 2 L nasal cannula oxygen however comfortable. Respiratory system: Clear to auscultation. Respiratory effort normal.  No added  sounds. Cardiovascular system: S1 & S2 heard, RRR.  Tachycardia.  No JVD, murmurs, rubs, gallops or clicks. No pedal edema. Gastrointestinal system: Abdomen is nondistended, soft and nontender. No organomegaly or masses felt. Normal bowel sounds heard. Central nervous system: Alert and oriented. No focal neurological deficits. Extremities: Symmetric 5 x 5 power. Skin: No  rashes, lesions or ulcers Psychiatry: Judgement and insight appear normal. Mood & affect appropriate.     Data Reviewed: I have personally reviewed following labs and imaging studies  CBC: Recent Labs  Lab 09/07/18 1241 09/08/18 0831  WBC 16.6* 14.5*  NEUTROABS 13.9* 12.0*  HGB 16.2 16.0  HCT 50.2 48.8  MCV 87.9 88.4  PLT 240 902   Basic Metabolic Panel: Recent Labs  Lab 09/07/18 1241 09/08/18 0831  NA 123* 127*  K 3.6 3.3*  CL 74* 73*  CO2 32 38*  GLUCOSE 403* 350*  BUN 104* 93*  CREATININE 2.26* 1.96*  CALCIUM 8.4* 8.1*   GFR: Estimated Creatinine Clearance: 30.4 mL/min (A) (by C-G formula based on SCr of 1.96 mg/dL (H)). Liver Function Tests: Recent Labs  Lab 09/07/18 1241  AST 28  ALT 27  ALKPHOS 125  BILITOT 0.4  PROT 6.8  ALBUMIN 3.5   No results for input(s): LIPASE, AMYLASE in the last 168 hours. No results for input(s): AMMONIA in the last 168 hours. Coagulation Profile: No results for input(s): INR, PROTIME in the last 168 hours. Cardiac Enzymes: No results for input(s): CKTOTAL, CKMB, CKMBINDEX, TROPONINI in the last 168 hours. BNP (last 3 results) No results for input(s): PROBNP in the last 8760 hours. HbA1C: Recent Labs    09/07/18 2308  HGBA1C 8.6*   CBG: Recent Labs  Lab 09/07/18 1516 09/07/18 2309 09/08/18 0737 09/08/18 1136  GLUCAP 370* 269* 365* 198*   Lipid Profile: No results for input(s): CHOL, HDL, LDLCALC, TRIG, CHOLHDL, LDLDIRECT in the last 72 hours. Thyroid Function Tests: No results for input(s): TSH, T4TOTAL, FREET4, T3FREE, THYROIDAB in the last 72 hours. Anemia Panel: No results for input(s): VITAMINB12, FOLATE, FERRITIN, TIBC, IRON, RETICCTPCT in the last 72 hours. Sepsis Labs: No results for input(s): PROCALCITON, LATICACIDVEN in the last 168 hours.  Recent Results (from the past 240 hour(s))  SARS Coronavirus 2 [facility] order, Performed in [facility] hospital lab) Nasopharyngeal Nasopharyngeal Swab      Status: None   Collection Time: 09/07/18 12:42 PM   Specimen: Nasopharyngeal Swab  Result Value Ref Range Status   SARS Coronavirus 2 NEGATIVE NEGATIVE Final    Comment: (NOTE) If result is NEGATIVE SARS-CoV-2 target nucleic acids are NOT DETECTED. The SARS-CoV-2 RNA is generally detectable in upper and lower  respiratory specimens during the acute phase of infection. The lowest  concentration of SARS-CoV-2 viral copies this assay can detect is 250  copies / mL. A negative result does not preclude SARS-CoV-2 infection  and should not be used as the sole basis for treatment or other  patient management decisions.  A negative result may occur with  improper specimen collection / handling, submission of specimen other  than nasopharyngeal swab, presence of viral mutation(s) within the  areas targeted by this assay, and inadequate number of viral copies  (<250 copies / mL). A negative result must be combined with clinical  observations, patient history, and epidemiological information. If result is POSITIVE SARS-CoV-2 target nucleic acids are DETECTED. The SARS-CoV-2 RNA is generally detectable in upper and lower  respiratory specimens dur ing the acute phase of infection.  Positive  results are indicative of active infection with SARS-CoV-2.  Clinical  correlation with patient history and other diagnostic information is  necessary to determine patient infection status.  Positive results do  not rule out bacterial infection or co-infection with other viruses. If result is PRESUMPTIVE POSTIVE SARS-CoV-2 nucleic acids MAY BE PRESENT.   A presumptive positive result was obtained on the submitted specimen  and confirmed on repeat testing.  While 2019 novel coronavirus  (SARS-CoV-2) nucleic acids may be present in the submitted sample  additional confirmatory testing may be necessary for epidemiological  and / or clinical management purposes  to differentiate between  SARS-CoV-2 and other  Sarbecovirus currently known to infect humans.  If clinically indicated additional testing with an alternate test  methodology [phone]) is advised. The SARS-CoV-2 RNA is generally  detectable in upper and lower respiratory sp ecimens during the acute  phase of infection. The expected result is Negative. Fact Sheet for Patients:  StrictlyIdeas.no Fact Sheet for Healthcare Providers: BankingDealers.co.za This test is not yet approved or cleared by the [place] FDA and has been authorized for detection and/or diagnosis of SARS-CoV-2 by FDA under an Emergency Use Authorization (EUA).  This EUA will remain in effect (meaning this test can be used) for the duration of the COVID-19 declaration under Section 564(b)(1) of the Act, 21 U.S.C. section 360bbb-3(b)(1), unless the authorization is terminated or revoked sooner. Performed at [facility], [place] [address], [place], [place] [address]   MRSA PCR Screening     Status: None   Collection Time: 09/07/18 11:19 PM   Specimen: Nasal Mucosa; Nasopharyngeal  Result Value Ref Range Status   MRSA by PCR NEGATIVE NEGATIVE Final    Comment:        The GeneXpert MRSA Assay (FDA approved for NASAL specimens only), is one component of a comprehensive MRSA colonization surveillance program. It is not intended to diagnose MRSA infection nor to guide or monitor treatment for MRSA infections. Performed at [facility], [place] [address] [place], [place] [address]          Radiology Studies: Dg Chest Port 1 View  Result Date: 09/07/2018 CLINICAL DATA:  Shortness of breath EXAM: PORTABLE CHEST 1 VIEW COMPARISON:  July 19, 2018. FINDINGS: Again noted is cardiomegaly with a hiatal hernia. There is streaky subsegmental atelectasis seen at the left lung base as on the prior exam. No new airspace consolidation. No acute osseous abnormality. IMPRESSION: No acute cardiopulmonary process.  Hiatal hernia Mild cardiomegaly Electronically Signed   By: Prudencio Pair M.D.   On: 09/07/2018 13:30        Scheduled Meds: . allopurinol  50 mg Oral Daily  . diltiazem  240 mg Oral Daily  . enoxaparin (LOVENOX) injection  30 mg Subcutaneous Q24H  . finasteride  5 mg Oral QHS  . FLUoxetine  10 mg Oral Daily  . insulin aspart  0-15 Units Subcutaneous TID WC  . insulin aspart  0-5 Units Subcutaneous QHS  . pantoprazole  40 mg Oral Daily  . pramipexole  0.75 mg Oral QHS  . spironolactone  12.5 mg Oral Daily  . traZODone  100 mg Oral QHS   Continuous Infusions: . sodium chloride       LOS: 1 day    Time spent: 25 minutes    Barb Merino, MD [facility] Pager [phone]  If 7PM-7AM, please contact night-coverage [email or web address] Password [email or web address] 09/08/2018, 12:15 PM

## 2018-09-08 NOTE — Evaluation (Signed)
Physical Therapy Evaluation Patient Details Name: Ryan [name] MRN: [id number] DOB: Nov 26, 1931 Today's Date: 09/08/2018   History of Present Illness  85 y.o. male with medical history significant of NHL  S/p radiation therapy; HTN; COPD/pulmonary fibrosis on home O2; and HLD presenting with SOB. Reports bilateral ankle pain, and weakness which resulted in fall. Admitted 09/07/18 for treatment  rapid atrial tachycardia.    Clinical Impression  Pt from [facility] where he reports independence in mobility without AD in apartment until he started falling 2 days prior to admission. Pt reports facility provides assist with LE dressing, bathing and meal prep. Pt is currently limited in safe mobility by decreased cognition especially short term memory and safety awareness. Pt found half naked standing at EoB with heavy UE support of tray table and lines and leads tangled around him. Pt also has decreased strength, balance and endurance. Pt requires minA for transfers and ambulation of 28 feet with RW. PT recommending SNF level rehab prior to return to his ALF apartment. PT will continue to follow acutely.    Follow Up Recommendations SNF    Equipment Recommendations  Other (comment)(TBD at next venue)       Precautions / Restrictions Precautions Precautions: Fall Precaution Comments: reports 2 falls in last 2 days Restrictions Weight Bearing Restrictions: No      Mobility  Bed Mobility               General bed mobility comments: sitting EoB  Transfers Overall transfer level: Needs assistance Equipment used: Rolling walker (2 wheeled) Transfers: Sit to/from Stand Sit to Stand: Min assist         General transfer comment: minA for power up and steadying in RW, increased posterior lean on edge of bed to steady himself  Ambulation/Gait Ambulation/Gait assistance: Min assist Gait Distance (Feet): 28 Feet Assistive device: Rolling walker (2 wheeled) Gait Pattern/deviations:  Step-through pattern;Decreased step length - right;Decreased step length - left;Shuffle;Decreased stride length Gait velocity: slowed Gait velocity interpretation: <1.8 ft/sec, indicate of risk for recurrent falls General Gait Details: minA for steadying, very small shuffling steps with decreased knee flexion, vc for proximity to RW         Balance Overall balance assessment: Needs assistance Sitting-balance support: Feet supported;No upper extremity supported Sitting balance-Leahy Scale: Fair     Standing balance support: Bilateral upper extremity supported Standing balance-Leahy Scale: Poor Standing balance comment: requires UE support to maintain balance                             Pertinent Vitals/Pain Pain Assessment: 0-10 Pain Score: 9  Pain Location: back and bilateral LE Pain Descriptors / Indicators: Burning;Pins and needles;Aching Pain Intervention(s): Limited activity within patient's tolerance;Monitored during session;Repositioned    Home Living Family/patient expects to be discharged to:: Assisted living([facility])               Home Equipment: Walker - 4 wheels      Prior Function Level of Independence: Needs assistance   Gait / Transfers Assistance Needed: able to ambulate in apartment without AD however experienced 2 falls in last 2 days and had started to use Rollator  ADL's / Homemaking Assistance Needed: assist with LE dressing, bathing            Extremity/Trunk Assessment   Upper Extremity Assessment Upper Extremity Assessment: Overall WFL for tasks assessed    Lower Extremity Assessment Lower Extremity Assessment: RLE deficits/detail;LLE  deficits/detail RLE Deficits / Details: lacking full AROM in knees and ankles, strength grossly assessed as 3+/5 RLE Sensation: decreased light touch RLE Coordination: decreased fine motor LLE Deficits / Details: lacking full AROM in knees and ankles, strength grossly assessed as 3+/5 LLE  Sensation: decreased light touch LLE Coordination: decreased fine motor       Communication   Communication: No difficulties  Cognition Arousal/Alertness: Awake/alert Behavior During Therapy: WFL for tasks assessed/performed Overall Cognitive Status: Impaired/Different from baseline Area of Impairment: Attention;Memory;Following commands;Safety/judgement;Awareness;Problem solving                   Current Attention Level: Alternating Memory: Decreased short-term memory;Decreased recall of precautions Following Commands: Follows one step commands with increased time;Follows multi-step commands with increased time Safety/Judgement: Decreased awareness of safety Awareness: Emergent Problem Solving: Slow processing;Difficulty sequencing;Requires verbal cues;Requires tactile cues General Comments: pt refusing to wear gown, found standing EoB with T-shirt and condom catheter, and heavy UE support on tray table, when asked what he was doing he replied " I just felt like I needed to stand.", most of his conversation very lucid with ocassional nonsensical responses which he is able to self correct when reasked question       General Comments General comments (skin integrity, edema, etc.): pt with mild edema in bilateral LE, max HR with ambulation 125 bpm, pt on 2L O2 via [place] and maintain SaO2 >90%O2 throughout session, pt with c/o of altered vision with difficulty in depth perception which is transient        Assessment/Plan    PT Assessment Patient needs continued PT services  PT Problem List Decreased strength;Decreased range of motion;Decreased activity tolerance;Decreased balance;Decreased mobility;Decreased cognition;Decreased coordination;Decreased safety awareness;Decreased knowledge of use of DME;Pain;Impaired sensation       PT Treatment Interventions DME instruction;Gait training;Functional mobility training;Therapeutic activities;Therapeutic exercise;Balance  training;Cognitive remediation;Patient/family education    PT Goals (Current goals can be found in the Care Plan section)  Acute Rehab PT Goals Patient Stated Goal: have less pain  PT Goal Formulation: With patient Time For Goal Achievement: 09/22/18 Potential to Achieve Goals: Fair    Frequency Min 2X/week    AM-PAC PT "6 Clicks" Mobility  Outcome Measure Help needed turning from your back to your side while in a flat bed without using bedrails?: A Little Help needed moving from lying on your back to sitting on the side of a flat bed without using bedrails?: A Little Help needed moving to and from a bed to a chair (including a wheelchair)?: A Little Help needed standing up from a chair using your arms (e.g., wheelchair or bedside chair)?: A Little Help needed to walk in hospital room?: A Little Help needed climbing 3-5 steps with a railing? : A Lot 6 Click Score: 17    End of Session Equipment Utilized During Treatment: Gait belt;Oxygen Activity Tolerance: Patient tolerated treatment well Patient left: in chair;with call bell/phone within reach;with chair alarm set Nurse Communication: Mobility status;Other (comment)(decreased memory/safety awareness) PT Visit Diagnosis: Unsteadiness on feet (R26.81);Other abnormalities of gait and mobility (R26.89);Muscle weakness (generalized) (M62.81);History of falling (Z91.81);Difficulty in walking, not elsewhere classified (R26.2);Pain Pain - Right/Left: (bilateral LE) Pain - part of body: Leg(back)    Time: 1607-3710 PT Time Calculation (min) (ACUTE ONLY): 34 min   Charges:   PT Evaluation $PT Eval Moderate Complexity: 1 Mod PT Treatments $Gait Training: 8-22 mins        Rosaly Labarbera B. Migdalia Dk PT, DPT Acute Rehabilitation Services Pager [phone]  [phone] Office [phone]   [place] 09/08/2018, 4:43 PM

## 2018-09-09 ENCOUNTER — Inpatient Hospital Stay (HOSPITAL_COMMUNITY): Payer: Medicare HMO

## 2018-09-09 DIAGNOSIS — R0602 Shortness of breath: Secondary | ICD-10-CM

## 2018-09-09 DIAGNOSIS — I272 Pulmonary hypertension, unspecified: Secondary | ICD-10-CM

## 2018-09-09 LAB — BASIC METABOLIC PANEL
Anion gap: 12 (ref 5–15)
BUN: 82 mg/dL — ABNORMAL HIGH (ref 8–23)
CO2: 38 mmol/L — ABNORMAL HIGH (ref 22–32)
Calcium: 8.3 mg/dL — ABNORMAL LOW (ref 8.9–10.3)
Chloride: 81 mmol/L — ABNORMAL LOW (ref 98–111)
Creatinine, Ser: 1.69 mg/dL — ABNORMAL HIGH (ref 0.61–1.24)
GFR calc Af Amer: 42 mL/min — ABNORMAL LOW (ref >60)
GFR calc non Af Amer: 36 mL/min — ABNORMAL LOW (ref >60)
Glucose, Bld: 232 mg/dL — ABNORMAL HIGH (ref 70–99)
Potassium: 3.4 mmol/L — ABNORMAL LOW (ref 3.5–5.1)
Sodium: 131 mmol/L — ABNORMAL LOW (ref 135–145)

## 2018-09-09 LAB — CBC WITH DIFFERENTIAL/PLATELET
Abs Immature Granulocytes: 0.17 10*3/uL — ABNORMAL HIGH (ref 0.00–0.07)
Basophils Absolute: 0 10*3/uL (ref 0.0–0.1)
Basophils Relative: 0 %
Eosinophils Absolute: 0.1 10*3/uL (ref 0.0–0.5)
Eosinophils Relative: 1 %
HCT: 48.1 % (ref 39.0–52.0)
Hemoglobin: 15.3 g/dL (ref 13.0–17.0)
Immature Granulocytes: 2 %
Lymphocytes Relative: 11 %
Lymphs Abs: 1.2 10*3/uL (ref 0.7–4.0)
MCH: 29 pg (ref 26.0–34.0)
MCHC: 31.8 g/dL (ref 30.0–36.0)
MCV: 91.3 fL (ref 80.0–100.0)
Monocytes Absolute: 0.9 10*3/uL (ref 0.1–1.0)
Monocytes Relative: 8 %
Neutro Abs: 8.6 10*3/uL — ABNORMAL HIGH (ref 1.7–7.7)
Neutrophils Relative %: 78 %
Platelets: 226 10*3/uL (ref 150–400)
RBC: 5.27 MIL/uL (ref 4.22–5.81)
RDW: 15.3 % (ref 11.5–15.5)
WBC: 10.9 10*3/uL — ABNORMAL HIGH (ref 4.0–10.5)
nRBC: 0 % (ref 0.0–0.2)

## 2018-09-09 LAB — GLUCOSE, CAPILLARY
Glucose-Capillary: 352 mg/dL — ABNORMAL HIGH (ref 70–99)
Glucose-Capillary: 171 mg/dL — ABNORMAL HIGH (ref 70–99)
Glucose-Capillary: 160 mg/dL — ABNORMAL HIGH (ref 70–99)
Glucose-Capillary: 235 mg/dL — ABNORMAL HIGH (ref 70–99)

## 2018-09-09 MED ORDER — DOCUSATE SODIUM 100 MG PO CAPS
100.0000 mg | ORAL_CAPSULE | Freq: Every day | ORAL | Status: DC | PRN
  Administered 2018-09-11: 100 mg via ORAL
  Filled 2018-09-09: qty 1

## 2018-09-09 MED ORDER — NYSTATIN 100000 UNIT/ML MT SUSP
5.0000 mL | Freq: Four times a day (QID) | OROMUCOSAL | Status: DC
  Administered 2018-09-09 – 2018-09-12 (×13): 500000 [IU] via ORAL
  Filled 2018-09-09 (×13): qty 5

## 2018-09-09 MED ORDER — SORBITOL 70 % SOLN
30.0000 mL | Freq: Once | Status: AC
  Administered 2018-09-09: 30 mL via ORAL
  Filled 2018-09-09: qty 30

## 2018-09-09 MED ORDER — POLYETHYLENE GLYCOL 3350 17 G PO PACK
17.0000 g | PACK | Freq: Every day | ORAL | Status: DC
  Administered 2018-09-09 – 2018-09-12 (×4): 17 g via ORAL
  Filled 2018-09-09 (×4): qty 1

## 2018-09-09 MED ORDER — DILTIAZEM HCL ER COATED BEADS 180 MG PO CP24
360.0000 mg | ORAL_CAPSULE | Freq: Every day | ORAL | Status: DC
  Administered 2018-09-10 – 2018-09-12 (×3): 360 mg via ORAL
  Filled 2018-09-09 (×3): qty 2

## 2018-09-09 MED ORDER — BISACODYL 10 MG RE SUPP
10.0000 mg | Freq: Once | RECTAL | Status: AC
  Administered 2018-09-10: 10 mg via RECTAL
  Filled 2018-09-09: qty 1

## 2018-09-09 MED ORDER — POTASSIUM CHLORIDE CRYS ER 20 MEQ PO TBCR
40.0000 meq | EXTENDED_RELEASE_TABLET | Freq: Once | ORAL | Status: AC
  Administered 2018-09-09: 40 meq via ORAL
  Filled 2018-09-09: qty 2

## 2018-09-09 MED ORDER — TECHNETIUM TO 99M ALBUMIN AGGREGATED
1.5000 | Freq: Once | INTRAVENOUS | Status: AC | PRN
  Administered 2018-09-09: 1.5 via INTRAVENOUS

## 2018-09-09 MED ORDER — TRAMADOL HCL 50 MG PO TABS
50.0000 mg | ORAL_TABLET | Freq: Four times a day (QID) | ORAL | Status: DC | PRN
  Administered 2018-09-09 – 2018-09-12 (×8): 50 mg via ORAL
  Filled 2018-09-09 (×8): qty 1

## 2018-09-09 NOTE — Progress Notes (Signed)
Spoke with patient's wife. She reports she would rather patient go back to [facility] with increase in services from the facility and HHOT/PT. She will not be able to talk to person who coordinates increasing services until Monday and is not sure if they have PT/OT services there or will allow them to come in. I have told her currently that I would not feel safe with patient being up on his feet by himself and that is normally how he would get around. He does have a PCA come in 7d/wk for dressing/breakfast, 3d/wk for showers, and 2 times a day for meds. He would need 24 hour care now (really for only when he is up on his feet but not sure if he would call for A). Would consider a W/C for home if he goes back to [facility] for him to get around in propelling with his feet.  [place], OTR/L Acute Rehab Services Pager [phone] Office [phone]

## 2018-09-09 NOTE — Evaluation (Signed)
Occupational Therapy Evaluation Patient Details Name: Ryan [name] MRN: [id number] DOB: 12/10/1931 Today's Date: 09/09/2018    History of Present Illness 85 y.o. male with medical history significant of NHL  S/p radiation therapy; HTN; COPD/pulmonary fibrosis on home O2; and HLD presenting with SOB. Reports bilateral ankle pain, and weakness which resulted in fall. Admitted 09/07/18 for treatment  rapid atrial tachycardia.     Clinical Impression   This 85 yo male admitted with above presents to acute OT with decreased mobility, decreased balance, increased pain all affecting his PLOF of being able to get around by himself with rollator (as of recent, prior nothing). He will benefit from acute OT with follow up [facility].    Follow Up Recommendations  Home health OT;Supervision/Assistance - 24 hour((see my note prior to this one) v. SNF)    Equipment Recommendations  Wheelchair (measurements OT);Wheelchair cushion (measurements OT)       Precautions / Restrictions Precautions Precautions: Fall Precaution Comments: reports 2 falls in last 2 days Restrictions Weight Bearing Restrictions: No      Mobility Bed Mobility Overal bed mobility: Needs Assistance Bed Mobility: Supine to Sit;Sit to Supine     Supine to sit: Min assist Sit to supine: Mod assist      Transfers Overall transfer level: Needs assistance Equipment used: Rolling walker (2 wheeled) Transfers: Sit to/from Stand Sit to Stand: Min assist              Balance Overall balance assessment: Needs assistance Sitting-balance support: Feet supported;No upper extremity supported Sitting balance-Leahy Scale: Fair     Standing balance support: Bilateral upper extremity supported Standing balance-Leahy Scale: Poor Standing balance comment: requires UE support to maintain balance; states he feels unsteady                           ADL either performed or assessed with clinical judgement   ADL  Overall ADL's : Needs assistance/impaired Eating/Feeding: Independent;Sitting   Grooming: Set up;Sitting;Supervision/safety   Upper Body Bathing: Set up;Supervision/ safety;Sitting   Lower Body Bathing: Maximal assistance Lower Body Bathing Details (indicate cue type and reason): min A sit<>stand Upper Body Dressing : Moderate assistance;Sitting   Lower Body Dressing: Total assistance Lower Body Dressing Details (indicate cue type and reason): min A sit<>stand Toilet Transfer: Minimal assistance;Ambulation;RW Toilet Transfer Details (indicate cue type and reason): foreward 5 steps and back 5 steps (very slowly and small steps) Toileting- Clothing Manipulation and Hygiene: Total assistance Toileting - Clothing Manipulation Details (indicate cue type and reason): min A sit<>stand             Vision Baseline Vision/History: Macular Degeneration              Pertinent Vitals/Pain Pain Assessment: Faces Faces Pain Scale: Hurts whole lot Pain Location: back Pain Descriptors / Indicators: Aching;Burning Pain Intervention(s): Limited activity within patient's tolerance;Monitored during session;Premedicated before session(made him aware that RN had written on his board that he could have pain meds again at 2:42pm and that he would have to call for it)     Hand Dominance Right   Extremity/Trunk Assessment Upper Extremity Assessment Upper Extremity Assessment: Generalized weakness           Communication Communication Communication: No difficulties   Cognition Arousal/Alertness: Awake/alert Behavior During Therapy: WFL for tasks assessed/performed Overall Cognitive Status: Impaired/Different from baseline Area of Impairment: Following commands;Safety/judgement  Following Commands: Follows one step commands with increased time;Follows multi-step commands with increased time Safety/Judgement: Decreased awareness of safety                     Home Living                               Home Equipment: Walker - 4 wheels          Prior Functioning/Environment Level of Independence: Needs assistance  Gait / Transfers Assistance Needed: able to ambulate in apartment without AD however experienced 2 falls in last 2 days and had started to use Rollator ADL's / Homemaking Assistance Needed: assist with dressing, bathing, breakfast and meds. 7d/wk for dressing and breakfast, 3d/wk for showers, and 2x/day for meds. Sleeps in chair at home            OT Problem List: Decreased strength;Decreased range of motion;Impaired balance (sitting and/or standing);Pain      OT Treatment/Interventions: Self-care/ADL training;DME and/or AE instruction;Patient/family education;Balance training    OT Goals(Current goals can be found in the care plan section) Acute Rehab OT Goals Patient Stated Goal: have less pain and be able to go home OT Goal Formulation: With patient/family Time For Goal Achievement: 09/23/18 Potential to Achieve Goals: Good  OT Frequency: Min 2X/week   Barriers to D/C: Decreased caregiver support  Spoke with pateint's wife. She reports she would rather patient go back to [facility] with increase in services from the facility and HHOT/PT. She will not be able to talk to person who coordinates increasing services until Monday and is not sure if they have PT/OT services there or will allow them to come in. I have told her currently that I would not feel safe with patient being up on his feet by himself and that is normally how he would get around. He does have a PCA come in 7d/wk for dressing/breakfast, 3d/wk for showers, and 2 times a day for meds. He would need 24 hour care now (really for only when he is up on his feet but not sure if he would call for A). Would consider a W/C for home if he goes back to [facility] for him to get around in propelling with his feet.          AM-PAC OT "6 Clicks" Daily  Activity     Outcome Measure Help from another person eating meals?: None Help from another person taking care of personal grooming?: A Little Help from another person toileting, which includes using toliet, bedpan, or urinal?: A Lot Help from another person bathing (including washing, rinsing, drying)?: A Lot Help from another person to put on and taking off regular upper body clothing?: A Lot Help from another person to put on and taking off regular lower body clothing?: Total 6 Click Score: 14   End of Session Equipment Utilized During Treatment: Gait belt;Rolling walker  Activity Tolerance: Patient limited by pain Patient left: in bed;with call bell/phone within reach;with bed alarm set  OT Visit Diagnosis: Unsteadiness on feet (R26.81);Other abnormalities of gait and mobility (R26.89);Repeated falls (R29.6);History of falling (Z91.81);Pain Pain - part of body: (back)                Time: 0600-4599 OT Time Calculation (min): 29 min Charges:  OT General Charges $OT Visit: 1 Visit OT Evaluation $OT Eval Moderate Complexity: 1 Mod OT Treatments $Self Care/Home Management : 8-22 mins  [place] [address], OTR/L Acute Rehab Services Pager [phone] Office [phone]     Almon Register 09/09/2018, 2:49 PM

## 2018-09-09 NOTE — Discharge Instructions (Signed)
Heart-Healthy Consistent Carbohydrate Nutrition Therapy - Copyright Academy of Nutrition and Dietetics A heart-healthy and consistent carbohydrate diet is recommended to manage heart disease and diabetes. To follow a heart-healthy and consistent carbohydrate diet, Eat a balanced diet with whole grains, fruits and vegetables, and lean protein sources. Choose heart-healthy unsaturated fats. Limit saturated fats, trans fats, and cholesterol intake. Eat more plant-based or vegetarian meals using beans and soy foods for protein. Eat whole, unprocessed foods to limit the amount of sodium (salt) you eat. Choose a consistent amount of carbohydrate at each meal and snack. Limit refined carbohydrates especially sugar, sweets and sugar-sweetened beverages. If you drink alcohol, do so in moderation: one serving per day (women) and two servings per day (men). o One serving is equivalent to 12 ounces beer, 5 ounces wine, or 1.5 ounces distilled spirits Tips Tips for Choosing Heart-Healthy Fats Choose lean protein and low-fat dairy foods to reduce saturated fat intake. Saturated fat is usually found in animal-based protein and is associated with certain health risks. Saturated fat is the biggest contributor to raise low-density lipoprotein (LDL) cholesterol levels. Research shows that limiting saturated fat lowers unhealthy cholesterol levels. Eat no more than 7% of your total calories each day from saturated fat. Ask your RDN to help you determine how much saturated fat is right for you. There are many foods that do not contain large amounts of saturated fats. Swapping these foods to replace foods high in saturated fats will help you limit the saturated fat you eat and improve your cholesterol levels. You can also try eating more plant-based or vegetarian meals. Instead of Try: Whole milk, cheese, yogurt, and ice cream 1% or skim milk, low-fat cheese, non-fat yogurt, and low-fat ice cream Fatty, marbled beef  and pork Lean beef, pork, or venison Poultry with skin Poultry without skin Butter, stick margarine Reduced-fat, whipped, or liquid spreads Coconut oil, palm oil Liquid vegetable oils: corn, canola, olive, soybean and safflower oils Copyright Academy of Nutrition and Dietetics. This handout may be duplicated for client education. Page 2/6 Avoid foods that contain trans fats. Trans fats increase levels of LDL-cholesterol. Hydrogenated fat in processed foods is the main source of trans fats in foods. Trans fats can be found in stick margarine, shortening, processed sweets, baked goods, some fried foods, and packaged foods made with hydrogenated oils. Avoid foods with partially hydrogenated oil on the ingredient list such as: cookies, pastries, baked goods, biscuits, crackers, microwave popcorn, and frozen dinners. Choose foods with heart healthy fats. Polyunsaturated and monounsaturated fat are unsaturated fats that may help lower your blood cholesterol level when used in place of saturated fat in your diet. Ask your RDN about taking a dietary supplement with plant sterols and stanols to help lower your cholesterol level. Research shows that substituting saturated fats with unsaturated fats is beneficial to cholesterol levels. Try these easy swaps: Instead of Try: Butter, stick margarine, or solid shortening Reduced-fat, whipped, or liquid spreads Beef, pork, or poultry with skin Fish and seafood Chips, crackers, snack foods Raw or unsalted nuts and seeds or nut butters Hummus with vegetables Avocado on toast Coconut oil, palm oil Liquid vegetable oils: corn, canola, olive, soybean and safflower oils Limit the amount of cholesterol you eat to less than 200 milligrams per day. Cholesterol is a substance carried through the bloodstream via lipoproteins, which are known as transporters of fat. Some body functions need cholesterol to work properly, but too much cholesterol in the  bloodstream can damage arteries and build up  blood vessel linings (which can lead to heart attack and stroke). You should eat less than 200 milligrams cholesterol per day. People respond differently to eating cholesterol. There is no test available right now that can figure out which people will respond more to dietary cholesterol and which will respond less. For individuals with high intake of dietary cholesterol, different types of increase (none, small, moderate, large) in LDL-cholesterol levels are all possible. Food sources of cholesterol include egg yolks and organ meats such as liver, gizzards. Limit egg yolks to two to four per week and avoid organ meats like liver and gizzards to control cholesterol intake. Tips for Choosing Heart-Healthy Carbohydrates Copyright Academy of Nutrition and Dietetics. This handout may be duplicated for client education. Page 3/6 Consume a consistent amount of carbohydrate It is important to eat foods with carbohydrates in moderation because they impact your blood glucose level. Carbohydrates can be found in many foods such as: Grains (breads, crackers, rice, pasta, and cereals) Starchy Vegetables (potatoes, corn, and peas) Beans and legumes Milk, soy milk, and yogurt Fruit and fruit juice Sweets (cakes, cookies, ice cream, jam and jelly) Your RDN will help you set a goal for how many carbohydrate servings to eat at your meals and snacks. For many adults, eating 3 to 5 servings of carbohydrate foods at each meal and 1 or 2 carbohydrate servings for each snack works well. Check your blood glucose level regularly. It can tell you if you need to adjust when you eat carbohydrates. Choose foods rich in viscous (soluble) fiber Viscous, or soluble, is found in the walls of plant cells. Viscous fiber is found only in plant-based foods. Eating foods with fiber helps to lower your unhealthy cholesterol and keep your blood glucose in range Rich sources of viscous  fiber include vegetables (asparagus, Brussels sprouts, sweet potatoes, turnips) fruit (apricots, mangoes, oranges), legumes, and whole grains (barley, oats, and oat bran). As you increase your fiber intake gradually, also increase the amount of water you drink. This will help prevent constipation. If you have difficulty achieving this goal, ask your RDN about fiber laxatives. Choose fiber supplements made with viscous fibers such as psyllium seed husks or methylcellulose to help lower unhealthy cholesterol. Limit refined carbohydrates There are three types of carbohydrates: starches, sugar, and fiber. Some carbohydrates occur naturally in food, like the starches in rice or corn or the sugars in fruits and milk. Refined carbohydrates--foods with high amounts of simple sugars--can raise triglyceride levels. High triglyceride levels are associated with coronary heart disease. Some examples of refined carbohydrate foods are table sugar, sweets, and beverages sweetened with added sugar. Tips for Reducing Sodium (Salt) Although sodium is important for your body to function, too much sodium can be harmful for people with high blood pressure. As sodium and fluid buildup in your tissues and bloodstream, your blood pressure increases. High blood pressure may cause damage to other organs and increase your risk for a stroke. Even if you take a pill for blood pressure or a water pill (diuretic) to remove fluid, it is still important to have less salt in your diet. Ask your doctor and RDN what amount of sodium is right for you. Avoid processed foods. Eat more fresh foods. Fresh fruits and vegetables are naturally low in sodium, as well as frozen vegetables and fruits that have no added juices or sauces. Fresh meats are lower in sodium than processed meats, such as bacon, sausage, and hotdogs. Read the nutrition label or ask your butcher to  help you find a fresh meat that is low in sodium. Eat less salt--at  the table and when cooking. A single teaspoon of table salt has 2,300 mg of sodium. Leave the salt out of recipes for pasta, casseroles, and soups. Ask your RDN how to cook your favorite recipes without sodium Be a smart shopper. Copyright Academy of Nutrition and Dietetics. This handout may be duplicated for client education. Page 4/6 Look for food packages that say salt-free or sodium-free. These items contain less than 5 milligrams of sodium per serving. Very low-sodium products contain less than 35 milligrams of sodium per serving. Low-sodium products contain less than 140 milligrams of sodium per serving. Beware for Unsalted or No Added Salt products. These items may still be high in sodium. Check the nutrition label. Add flavors to your food without adding sodium. Try lemon juice, lime juice, fruit juice or vinegar. Dry or fresh herbs add flavor. Try basil, bay leaf, dill, rosemary, parsley, sage, dry mustard, nutmeg, thyme, and paprika. Pepper, red pepper flakes, and cayenne pepper can add spice t your meals without adding sodium. Hot sauce contains sodium, but if you use just a drop or two, it will not add up to much. Buy a sodium-free seasoning blend or make your own at home.  Additional Lifestyle Tips Achieve and maintain a healthy weight. Talk with your RDN or your doctor about what is a healthy weight for you. Set goals to reach and maintain that weight. To lose weight, reduce your calorie intake along with increasing your physical activity. A weight loss of 10 to 15 pounds could reduce LDL-cholesterol by 5 milligrams per deciliter. Participate in physical activity. Talk with your health care team to find out what types of physical activity are best for you. Set a plan to get about 30 minutes of exercise on most days. Foods Recommended Food Group Foods Recommended Grains Whole grain breads and cereals, including whole wheat, barley, rye, buckwheat, corn,  teff, quinoa, millet, amaranth, brown or wild rice, sorghum, and oats Pasta, especially whole wheat or other whole grain types [employer], quinoa or wild rice Whole grain crackers, bread, r olls, pitas Home-made bread with reduced-sodium baking soda Protein Foods Lean cuts of beef and pork (loin, leg, round, extra lean hamburger) Skinless poultry Fish Veni son and other wild game Dried beans and peas Nuts and nut butters Meat alternatives mad e with soy or textured vegetable protein Egg whites or egg substitute Cold cuts made with lean me at or soy protein Copyright Academy of Nutrition and Dietetics. This handout may be duplicated for client education. Page 5/6 Dairy Nonfat (skim), low-fat, or 1%-fat milk Nonfat or low-fat yogurt or cottage ch eese Fat-free and low-fat cheese Vegetables Fresh, frozen, or canned vegetables without added fat or salt Fruits Fresh, frozen, canned, or dried fruit Oils Unsaturated oils (corn, olive, peanut, soy, sunflower, canola) Soft or liquid margarines and vegetable oil spreads Salad dressings Seeds and nuts Avocado Foods Not Recommended Food Group Foods Not Recommended Grains Breads or crackers topped with salt Cereals (hot or cold) with more than 300 mg sodium per serving Biscuits, cornbread, and other quick breads prepared with bak ing soda Bread crumbs or stuffing mix from a store High-fat bakery products, such as doughn uts, biscuits, croissants, danish pastries, pies, cookies Instant cooking foods to which you add hot water and stir--potatoes, noodles, rice, etc. Packaged starchy foods--seasoned noodle or rice dishes, stuffing mix, macaroni and che ese dinner Snacks made with partially hydrogenated  oils, including chips, cheese puffs, snack mixes, regular c rackers, butter-flavored popcorn Protein Foods Higher-fat cuts of meats (ribs, t-bone steak, regular hamburger) Bacon, sausage, or hot dogs Cold cuts, such as salami or  bologna, deli meats, cured meats, corned beef Organ meats (liver, brains, gizzards, sweetbreads) Poultry with skin Fried or smoked meat, poultry, and fish Whole eggs and egg yolks (more than 2 -4 per week) Salted legumes, nuts, seeds, or nut/seed butters Meat alternatives with high levels of sodium (>30 0 mg per serving) or saturated fat (>5 g per serving) Dairy Whole milk,?2% fat milk, buttermilk Whole milk yogurt or ice cream Cream Half-&- half Cream che ese Sour cream Cheese Vegetables Canned or frozen vegetables with salt, fresh vegetables prepared with salt, butter, cheese, or cream sauce Fried vegetables Pickled vegetabl es such as olives, pickles, or sauerkraut Fruits Fried fruits Fruits serve d with butter or cream . This handout may be duplicated for client education. Page 6/6 Oils Butter, stick margarine, shortening Partially hydrogenated oils or trans fats Tropical oils (coconut, palm, palm kerne l oils) Other Candy, sugar sweetened soft drinks and desserts Salt, sea salt, garlic salt, and seasoning mixes con taining salt Bouillon cubes Ketchup, barbe cue sauce, Worcestershire sauce, soy sauce, teriyaki sauce Miso Salsa Pickle s, olives, relish Heart Healthy Consistent Carbohydrate Sample 1-Day Menu Breakfast 1 cup cooked oatmeal (2 carbohydrate servings) 3/4 cup blueberries (1 carbohydrate serving) 1 ounce almonds 1 cup skim milk (1 carbohydrate serving) 1 cup coffee Morning Snack 1 cup sugar-free nonfat yogurt (1 carbohydrate serving) Lunch 2 slices whole-wheat bread (2 carbohydrate servings) 2 ounces lean [place] breast 1 ounce low-fat Swiss cheese 1 teaspoon mustard 1 slice tomato 1 lettuce leaf 1 small pear (1 carbohydrate serving) 1 cup skim milk (1 carbohydrate serving) Afternoon Snack 1 ounce trail mix with unsalted nuts, seeds, and raisins (1 carbohydrate serving) Evening Meal 3 ounces salmon 2/3 cup cooked brown rice (2 carbohydrate  servings) 1 teaspoon soft margarine 1 cup cooked broccoli with 1/2 cup cooked carrots (1 carbohydrate serving Carrots, cooked, boiled, drained, without salt 1 cup lettuce 1 teaspoon olive oil with vinegar for dressing 1 small whole grain roll (1 carbohydrate serving) 1 teaspoon soft margarine 1 cup unsweetened tea Evening Snack 1 extra-small banana (1 carbohydrate serving)

## 2018-09-09 NOTE — Progress Notes (Signed)
Patient ID: Ryan [name], male   DOB: 10-24-1931, 85 y.o.   MRN: [id number]     Advanced Heart Failure Rounding Note  PCP-Cardiologist: [name], MD   Subjective:    Remains on IVF. Weight up 3 pounds. Very good urine output.  Creatinine 2.26 -> 1.96 -> 1.69  Tele and ECG show MAT. Rate improved on po diltiazem typically 90-105 but will go to 130 with minimal activity  Says he doesn't feel well. Remains constipated and having back and muscle pain.   Denies SOB.  Objective:   Weight Range: 93.7 kg Body mass index is 32.36 kg/m.   Vital Signs:   Temp:  [97.4 F (36.3 C)-97.8 F (36.6 C)] 97.4 F (36.3 C) (08/08 0623) Pulse Rate:  [62-107] 95 (08/08 0623) Resp:  [17-22] 17 (08/08 0623) BP: (117-126)/(69-91) 117/83 (08/08 0623) SpO2:  [91 %-95 %] 95 % (08/08 0623) Weight:  [93.7 kg] 93.7 kg (08/08 0623) Last BM Date: 09/06/18(per pt)  Weight change: Filed Weights   09/07/18 2219 09/08/18 0500 09/09/18 0623  Weight: 91.8 kg 92.3 kg 93.7 kg    Intake/Output:   Intake/Output Summary (Last 24 hours) at 09/09/2018 1129 Last data filed at 09/09/2018 0140 Gross per 24 hour  Intake 1747.2 ml  Output 1450 ml  Net 297.2 ml      Physical Exam    General:  Elderly male. No resp difficulty HEENT: normal Neck: supple. no JVD. Carotids 2+ bilat; no bruits. No lymphadenopathy or thryomegaly appreciated. Cor: PMI nondisplaced. Irregular rate & rhythm. No rubs, gallops or murmurs. Lungs: clear Abdomen: soft, nontender, nondistended. No hepatosplenomegaly. No bruits or masses. Good bowel sounds. Extremities: no cyanosis, clubbing, rash, trace edema Neuro: alert & orientedx3, cranial nerves grossly intact. moves all 4 extremities w/o difficulty. Affect pleasant    Telemetry   Multifocal atrial tachycardia with rate 90-100 Personally reviewed   Labs    CBC Recent Labs    09/08/18 0831 09/09/18 0352  WBC 14.5* 10.9*  NEUTROABS 12.0* 8.6*  HGB 16.0 15.3  HCT  48.8 48.1  MCV 88.4 91.3  PLT 254 191   Basic Metabolic Panel Recent Labs    09/08/18 0831 09/09/18 0352  NA 127* 131*  K 3.3* 3.4*  CL 73* 81*  CO2 38* 38*  GLUCOSE 350* 232*  BUN 93* 82*  CREATININE 1.96* 1.69*  CALCIUM 8.1* 8.3*  MG 2.5*  --    Liver Function Tests Recent Labs    09/07/18 1241  AST 28  ALT 27  ALKPHOS 125  BILITOT 0.4  PROT 6.8  ALBUMIN 3.5   No results for input(s): LIPASE, AMYLASE in the last 72 hours. Cardiac Enzymes No results for input(s): CKTOTAL, CKMB, CKMBINDEX, TROPONINI in the last 72 hours.  BNP: BNP (last 3 results) Recent Labs    03/08/18 1859 07/19/18 1750 09/07/18 1241  BNP 40.3 36.1 40.6    ProBNP (last 3 results) No results for input(s): PROBNP in the last 8760 hours.   D-Dimer No results for input(s): DDIMER in the last 72 hours. Hemoglobin A1C Recent Labs    09/07/18 2308  HGBA1C 8.6*   Fasting Lipid Panel No results for input(s): CHOL, HDL, LDLCALC, TRIG, CHOLHDL, LDLDIRECT in the last 72 hours. Thyroid Function Tests No results for input(s): TSH, T4TOTAL, T3FREE, THYROIDAB in the last 72 hours.  Invalid input(s): FREET3  Other results:   Imaging    No results found.   Medications:     Scheduled Medications: . allopurinol  50 mg Oral Daily  . diltiazem  240 mg Oral Daily  . enoxaparin (LOVENOX) injection  30 mg Subcutaneous Q24H  . finasteride  5 mg Oral QHS  . FLUoxetine  10 mg Oral Daily  . insulin aspart  0-15 Units Subcutaneous TID WC  . insulin aspart  0-5 Units Subcutaneous QHS  . nystatin  5 mL Oral QID  . pantoprazole  40 mg Oral Daily  . polyethylene glycol  17 g Oral Daily  . pramipexole  0.75 mg Oral QHS  . spironolactone  12.5 mg Oral Daily  . traZODone  100 mg Oral QHS    Infusions:   PRN Medications: acetaminophen, docusate sodium, morphine injection, ondansetron (ZOFRAN) IV, traMADol, triazolam   Assessment/Plan   1. AKI on CKD stage 3: He does not appear volume  overloaded on exam.  He was on an aggressive diuretic regimen at home and had appeared to be diuretic resistant but creatinine had been remaining stable.  He tells me that he lost "15 lbs" in 1 day earlier this week and since then has been weak/lethargic. He was admitted with markedly elevated BUN and creatinine.  Renal function improving today with IVF. Creatinine 1.69 - Creatinine nearing baseline ~1.5. But BUN still high. Can stop IVF. Continue to hold diuretics - Suspect hyperglycemia may be predisposing to volume depletion.  2. Chronic diastolic CHF: Suspect with significant component of RV failure from pulmonary hypertension. Echo in 6/20 with EF 60-65%, RV not well-visualized but PASP 59 mmHg.RHC in 6/20 showedfilling pressures optimized, but after hospital discharge, he gained significant weight.  His diuretics had been increased significantly, up to torsemide 100 bid and metolazone three days/week with diuretic resistance.  This admission, he has AKI and does not appear volume overloaded (leg swelling much decreased since I last saw him).  - Much improved with IVF. Now taking good po. Creatinine improved. Can stop IVF - Will eventually need to restart his diuretics but hold for now.  Appears that he will need lower dose.  -Can continue spironolactone 12.5 daily with hypokalemia.  3. Pulmonary hypertnesion: Patient thought to have [place] out of proportion to his lung disease (ILD, NSIP versus UIP). RHC in 2/20 showed mean PA pressure 41, PVR 5.1 WU, CI 3. Echo in 6/20 with RV reportedly ok =>I reviewed and do not think that RV was well-visualized. Patient did not tolerate Adcirca (started at [facility]) due to edema/?cognitive effects. RHC was done again in 6/20, showed moderate pulmonary arterial hypertension with PVR 5.35 (comparable to prior).  Serological workup for PH was negative. Patient started ambrisentan 5 mg and developed significant edema/volume overload despite a high dose of torsemide.  I  am concerned that ambrisentan may have been part of the reason for the peripheral edema/volume overload.  - he will stay off ambrisentan and Adcirca (?trigger for volume overload). - V/Q scan to rule out chronic PEs pending.   - Home sleep study ordered.  - When  stable, can consider Uptravi (intolerant of Adcirca, Letairis).  4. MAT   - Rate has slowed to 90-100 on po diltiazem. Will increase diltiazem to 360 daily 5. Hypokalemia - supp 6. DM2, poorly controlled  - Hgba1c 8.6 - TRH managing 7. Constipation - will give one dose sorbitol to help  Length of Stay: 2  Glori Bickers, MD  09/09/2018, 11:29 AM  Advanced Heart Failure Team Pager [phone] (M-F; 7a - 4p)  Please contact [facility] for night-coverage after hours (4p -7a ) and weekends  on [email or web address]

## 2018-09-09 NOTE — Progress Notes (Signed)
Nutrition Brief Note  RD working remotely.  RD consulted for education for new onset diabetes. Unable to reach patient via phone today. RD will attach "Heart Healthy Consistent Carbohydrate" handout provided by the Academy of [employer] and Dietetics to the discharge instructions. Per chart review, pt resides at [facility] and has a PCA come in 7d/wk that assists him with dressing/breakfast. Per OT note, wife of patient will be contacting facility on Monday to explore increasing services for husband.   Wt Readings from Last 15 Encounters:  09/09/18 93.7 kg  08/25/18 95.6 kg  08/18/18 97.6 kg  08/11/18 99.4 kg  07/26/18 91.8 kg  03/08/18 89.8 kg  08/30/17 88 kg  07/28/17 87.2 kg  05/10/17 88.5 kg  04/07/17 86.4 kg  04/05/17 86.8 kg  03/21/17 86.2 kg  03/16/17 86.2 kg  03/03/17 86 kg  02/17/17 87.1 kg    Body mass index is 32.36 kg/m. Patient meets criteria for obesity based on current BMI.   Current diet order is HH/CHO, patient is consuming approximately 100% of meals at this time. Labs and medications reviewed.   No nutrition interventions warranted at this time. If nutrition issues arise, please re-consult RD.   Lajuan Lines, [place], [place] [phone] After Hours/Weekend Pager: [phone]

## 2018-09-09 NOTE — Progress Notes (Signed)
PROGRESS NOTE    Ryan [name]  IWL:[id number] DOB: 18-Jan-1932 DOA: 09/07/2018 PCP: Chesley Noon, MD    Brief Narrative:  85 year old gentleman with history of non-Hodgkin's lymphoma status post radiation therapy, hypertension, COPD/pulmonary fibrosis on home oxygen, hyperlipidemia from assisted living facility presented to the emergency room with bilateral leg cramps and thigh pain.  Reported falls.  Poor appetite for last several days and living mostly on juice and fruit.  Patient also complained that he has not peed for 2 days.  Patient has chronic low back pain but he has been having more myalgia currently.  In the emergency room he was clinically dehydrated, had tachycardia initially thought to be rapid A. fib, turned out to be rapid atrial tachycardia.  Admitted for clinical dehydration, acute renal failure, electrolyte abnormalities and tachycardia.   Assessment & Plan:   Principal Problem:   Atrial fibrillation with RVR (HCC) Active Problems:   Hyperlipidemia   Essential hypertension   COPD (HCC)   Leg pain  Rapid atrial tachycardia: Initially thought to be rapid A. fib.  Rate controlled currently on oral Cardizem.  Followed by cardiology.    Acute kidney injury on chronic kidney disease stage III: presented dehydrated , treated with IV fluid with good response. Sodium and chloride improved. Holding all diuretics.  Chronic diastolic heart failure: Recent echocardiogram with normal ejection fraction.  Patient on significant high dose of torsemide 100 mg twice daily and metolazone.  Gently hydrated with improvement.  Pulmonary hypertension: On 2 L of oxygen.  Fairly on baseline.  Outpatient work-up pending.  Deconditioning: Work with PT OT.  COPD: Stable.  Hypokalemia: Replaced.  Improved.   DVT prophylaxis: Lovenox subcu Code Status: DNR Family Communication: Patient requested to talk to wife, she was not able to pick up the phone. Disposition Plan: Back to  assisted living facility with therapies.  Can transition to telemetry.   Consultants:   Cardiology  Procedures:   None  Antimicrobials:   None   Subjective: Patient seen and examined.  No overnight events.  He is constipated.  Still has leg pains but somehow better than before. Objective: Vitals:   09/08/18 1101 09/08/18 1206 09/08/18 2024 09/09/18 0623  BP: 111/80 126/69 (!) 121/91 117/83  Pulse: 96 (!) 107 62 95  Resp: (!) 26 18 (!) 22 17  Temp:   97.8 F (36.6 C) (!) 97.4 F (36.3 C)  TempSrc:   Oral Axillary  SpO2: 91% 91% 95% 95%  Weight:    93.7 kg  Height:        Intake/Output Summary (Last 24 hours) at 09/09/2018 1213 Last data filed at 09/09/2018 0140 Gross per 24 hour  Intake 1747.2 ml  Output 1450 ml  Net 297.2 ml   Filed Weights   09/07/18 2219 09/08/18 0500 09/09/18 0623  Weight: 91.8 kg 92.3 kg 93.7 kg    Examination:  General exam: Appears calm and comfortable, chronically sick looking, on 2 L nasal cannula oxygen however comfortable. Respiratory system: Clear to auscultation. Respiratory effort normal.  No added sounds. Cardiovascular system: S1 & S2 heard, RRR.  Tachycardia.  No JVD, murmurs, rubs, gallops or clicks. No pedal edema. Gastrointestinal system: Abdomen is nondistended, soft and nontender. No organomegaly or masses felt. Normal bowel sounds heard. Central nervous system: Alert and oriented. No focal neurological deficits. Extremities: Symmetric 5 x 5 power. Skin: No rashes, lesions or ulcers Psychiatry: Judgement and insight appear normal. Mood & affect appropriate.     Data Reviewed:  I have personally reviewed following labs and imaging studies  CBC: Recent Labs  Lab 09/07/18 1241 09/08/18 0831 09/09/18 0352  WBC 16.6* 14.5* 10.9*  NEUTROABS 13.9* 12.0* 8.6*  HGB 16.2 16.0 15.3  HCT 50.2 48.8 48.1  MCV 87.9 88.4 91.3  PLT 240 254 144   Basic Metabolic Panel: Recent Labs  Lab 09/07/18 1241 09/08/18 0831 09/09/18  0352  NA 123* 127* 131*  K 3.6 3.3* 3.4*  CL 74* 73* 81*  CO2 32 38* 38*  GLUCOSE 403* 350* 232*  BUN 104* 93* 82*  CREATININE 2.26* 1.96* 1.69*  CALCIUM 8.4* 8.1* 8.3*  MG  --  2.5*  --    GFR: Estimated Creatinine Clearance: 35.5 mL/min (A) (by C-G formula based on SCr of 1.69 mg/dL (H)). Liver Function Tests: Recent Labs  Lab 09/07/18 1241  AST 28  ALT 27  ALKPHOS 125  BILITOT 0.4  PROT 6.8  ALBUMIN 3.5   No results for input(s): LIPASE, AMYLASE in the last 168 hours. No results for input(s): AMMONIA in the last 168 hours. Coagulation Profile: No results for input(s): INR, PROTIME in the last 168 hours. Cardiac Enzymes: No results for input(s): CKTOTAL, CKMB, CKMBINDEX, TROPONINI in the last 168 hours. BNP (last 3 results) No results for input(s): PROBNP in the last 8760 hours. HbA1C: Recent Labs    09/07/18 2308  HGBA1C 8.6*   CBG: Recent Labs  Lab 09/08/18 0737 09/08/18 1136 09/08/18 1654 09/08/18 2157 09/09/18 0738  GLUCAP 365* 198* 263* 366* 352*   Lipid Profile: No results for input(s): CHOL, HDL, LDLCALC, TRIG, CHOLHDL, LDLDIRECT in the last 72 hours. Thyroid Function Tests: No results for input(s): TSH, T4TOTAL, FREET4, T3FREE, THYROIDAB in the last 72 hours. Anemia Panel: No results for input(s): VITAMINB12, FOLATE, FERRITIN, TIBC, IRON, RETICCTPCT in the last 72 hours. Sepsis Labs: No results for input(s): PROCALCITON, LATICACIDVEN in the last 168 hours.  Recent Results (from the past 240 hour(s))  SARS Coronavirus 2 [facility] order, Performed in [facility] hospital lab) Nasopharyngeal Nasopharyngeal Swab     Status: None   Collection Time: 09/07/18 12:42 PM   Specimen: Nasopharyngeal Swab  Result Value Ref Range Status   SARS Coronavirus 2 NEGATIVE NEGATIVE Final    Comment: (NOTE) If result is NEGATIVE SARS-CoV-2 target nucleic acids are NOT DETECTED. The SARS-CoV-2 RNA is generally detectable in upper and lower  respiratory specimens  during the acute phase of infection. The lowest  concentration of SARS-CoV-2 viral copies this assay can detect is 250  copies / mL. A negative result does not preclude SARS-CoV-2 infection  and should not be used as the sole basis for treatment or other  patient management decisions.  A negative result may occur with  improper specimen collection / handling, submission of specimen other  than nasopharyngeal swab, presence of viral mutation(s) within the  areas targeted by this assay, and inadequate number of viral copies  (<250 copies / mL). A negative result must be combined with clinical  observations, patient history, and epidemiological information. If result is POSITIVE SARS-CoV-2 target nucleic acids are DETECTED. The SARS-CoV-2 RNA is generally detectable in upper and lower  respiratory specimens dur ing the acute phase of infection.  Positive  results are indicative of active infection with SARS-CoV-2.  Clinical  correlation with patient history and other diagnostic information is  necessary to determine patient infection status.  Positive results do  not rule out bacterial infection or co-infection with other viruses. If result is  PRESUMPTIVE POSTIVE SARS-CoV-2 nucleic acids MAY BE PRESENT.   A presumptive positive result was obtained on the submitted specimen  and confirmed on repeat testing.  While 2019 novel coronavirus  (SARS-CoV-2) nucleic acids may be present in the submitted sample  additional confirmatory testing may be necessary for epidemiological  and / or clinical management purposes  to differentiate between  SARS-CoV-2 and other Sarbecovirus currently known to infect humans.  If clinically indicated additional testing with an alternate test  methodology [phone]) is advised. The SARS-CoV-2 RNA is generally  detectable in upper and lower respiratory sp ecimens during the acute  phase of infection. The expected result is Negative. Fact Sheet for Patients:   StrictlyIdeas.no Fact Sheet for Healthcare Providers: BankingDealers.co.za This test is not yet approved or cleared by the [place] FDA and has been authorized for detection and/or diagnosis of SARS-CoV-2 by FDA under an Emergency Use Authorization (EUA).  This EUA will remain in effect (meaning this test can be used) for the duration of the COVID-19 declaration under Section 564(b)(1) of the Act, 21 U.S.C. section 360bbb-3(b)(1), unless the authorization is terminated or revoked sooner. Performed at [facility], [place] [address] [place], [place] [address]   MRSA PCR Screening     Status: None   Collection Time: 09/07/18 11:19 PM   Specimen: Nasal Mucosa; Nasopharyngeal  Result Value Ref Range Status   MRSA by PCR NEGATIVE NEGATIVE Final    Comment:        The GeneXpert MRSA Assay (FDA approved for NASAL specimens only), is one component of a comprehensive MRSA colonization surveillance program. It is not intended to diagnose MRSA infection nor to guide or monitor treatment for MRSA infections. Performed at [facility], [place] [address] [place], [place] [address]          Radiology Studies: Dg Chest Port 1 View  Result Date: 09/07/2018 CLINICAL DATA:  Shortness of breath EXAM: PORTABLE CHEST 1 VIEW COMPARISON:  July 19, 2018. FINDINGS: Again noted is cardiomegaly with a hiatal hernia. There is streaky subsegmental atelectasis seen at the left lung base as on the prior exam. No new airspace consolidation. No acute osseous abnormality. IMPRESSION: No acute cardiopulmonary process. Hiatal hernia Mild cardiomegaly Electronically Signed   By: Prudencio Pair M.D.   On: 09/07/2018 13:30        Scheduled Meds: . allopurinol  50 mg Oral Daily  . [START ON 09/10/2018] diltiazem  360 mg Oral Daily  . enoxaparin (LOVENOX) injection  30 mg Subcutaneous Q24H  . finasteride  5 mg Oral QHS  . FLUoxetine  10 mg Oral Daily  .  insulin aspart  0-15 Units Subcutaneous TID WC  . insulin aspart  0-5 Units Subcutaneous QHS  . nystatin  5 mL Oral QID  . pantoprazole  40 mg Oral Daily  . polyethylene glycol  17 g Oral Daily  . pramipexole  0.75 mg Oral QHS  . sorbitol  30 mL Oral Once  . spironolactone  12.5 mg Oral Daily  . traZODone  100 mg Oral QHS   Continuous Infusions:    LOS: 2 days    Time spent: 25 minutes    Barb Merino, MD [facility] Pager [phone]  If 7PM-7AM, please contact night-coverage [email or web address] Password [facility] 09/09/2018, 12:13 PM

## 2018-09-09 NOTE — Plan of Care (Signed)
  Problem: Clinical Measurements: Goal: Ability to maintain clinical measurements within normal limits will improve Outcome: Progressing Note: HR improving.  VSS.

## 2018-09-10 DIAGNOSIS — I272 Pulmonary hypertension, unspecified: Secondary | ICD-10-CM

## 2018-09-10 DIAGNOSIS — I471 Supraventricular tachycardia: Secondary | ICD-10-CM

## 2018-09-10 DIAGNOSIS — N179 Acute kidney failure, unspecified: Secondary | ICD-10-CM

## 2018-09-10 LAB — BASIC METABOLIC PANEL
Anion gap: 12 (ref 5–15)
BUN: 52 mg/dL — ABNORMAL HIGH (ref 8–23)
CO2: 36 mmol/L — ABNORMAL HIGH (ref 22–32)
Calcium: 8.4 mg/dL — ABNORMAL LOW (ref 8.9–10.3)
Chloride: 83 mmol/L — ABNORMAL LOW (ref 98–111)
Creatinine, Ser: 1.4 mg/dL — ABNORMAL HIGH (ref 0.61–1.24)
GFR calc Af Amer: 53 mL/min — ABNORMAL LOW (ref >60)
GFR calc non Af Amer: 46 mL/min — ABNORMAL LOW (ref >60)
Glucose, Bld: 190 mg/dL — ABNORMAL HIGH (ref 70–99)
Potassium: 3.7 mmol/L (ref 3.5–5.1)
Sodium: 131 mmol/L — ABNORMAL LOW (ref 135–145)

## 2018-09-10 LAB — CBC WITH DIFFERENTIAL/PLATELET
Abs Immature Granulocytes: 0.16 10*3/uL — ABNORMAL HIGH (ref 0.00–0.07)
Basophils Absolute: 0 10*3/uL (ref 0.0–0.1)
Basophils Relative: 0 %
Eosinophils Absolute: 0.1 10*3/uL (ref 0.0–0.5)
Eosinophils Relative: 1 %
HCT: 48.9 % (ref 39.0–52.0)
Hemoglobin: 15.1 g/dL (ref 13.0–17.0)
Immature Granulocytes: 2 %
Lymphocytes Relative: 8 %
Lymphs Abs: 0.8 10*3/uL (ref 0.7–4.0)
MCH: 28.6 pg (ref 26.0–34.0)
MCHC: 30.9 g/dL (ref 30.0–36.0)
MCV: 92.6 fL (ref 80.0–100.0)
Monocytes Absolute: 0.9 10*3/uL (ref 0.1–1.0)
Monocytes Relative: 9 %
Neutro Abs: 7.8 10*3/uL — ABNORMAL HIGH (ref 1.7–7.7)
Neutrophils Relative %: 80 %
Platelets: 206 10*3/uL (ref 150–400)
RBC: 5.28 MIL/uL (ref 4.22–5.81)
RDW: 15.3 % (ref 11.5–15.5)
WBC: 9.7 10*3/uL (ref 4.0–10.5)
nRBC: 0 % (ref 0.0–0.2)

## 2018-09-10 LAB — GLUCOSE, CAPILLARY
Glucose-Capillary: 185 mg/dL — ABNORMAL HIGH (ref 70–99)
Glucose-Capillary: 286 mg/dL — ABNORMAL HIGH (ref 70–99)
Glucose-Capillary: 173 mg/dL — ABNORMAL HIGH (ref 70–99)
Glucose-Capillary: 203 mg/dL — ABNORMAL HIGH (ref 70–99)

## 2018-09-10 MED ORDER — POTASSIUM CHLORIDE CRYS ER 20 MEQ PO TBCR
20.0000 meq | EXTENDED_RELEASE_TABLET | Freq: Once | ORAL | Status: AC
  Administered 2018-09-10: 20 meq via ORAL
  Filled 2018-09-10: qty 1

## 2018-09-10 MED ORDER — TORSEMIDE 20 MG PO TABS
40.0000 mg | ORAL_TABLET | Freq: Two times a day (BID) | ORAL | Status: DC
  Administered 2018-09-10: 40 mg via ORAL
  Filled 2018-09-10 (×2): qty 2

## 2018-09-10 MED ORDER — MAGNESIUM HYDROXIDE 400 MG/5ML PO SUSP
30.0000 mL | Freq: Once | ORAL | Status: AC
  Administered 2018-09-10: 30 mL via ORAL
  Filled 2018-09-10: qty 30

## 2018-09-10 MED ORDER — ENOXAPARIN SODIUM 40 MG/0.4ML ~~LOC~~ SOLN
40.0000 mg | SUBCUTANEOUS | Status: DC
  Administered 2018-09-10 – 2018-09-11 (×2): 40 mg via SUBCUTANEOUS
  Filled 2018-09-10 (×2): qty 0.4

## 2018-09-10 NOTE — Progress Notes (Signed)
PROGRESS NOTE    Ryan [name]  VZC:[id number] DOB: 1931/09/01 DOA: 09/07/2018 PCP: Chesley Noon, MD    Brief Narrative:  85 year old gentleman with history of non-Hodgkin's lymphoma status post radiation therapy, hypertension, COPD/pulmonary fibrosis on home oxygen, hyperlipidemia from assisted living facility presented to the emergency room with bilateral leg cramps and thigh pain.  Reported falls.  Poor appetite for last several days and living mostly on juice and fruit.  Patient also complained that he has not peed for 2 days.  Patient has chronic low back pain but he has been having more myalgia currently. In the emergency room he was clinically dehydrated, had tachycardia initially thought to be rapid A. fib, turned out to be rapid atrial tachycardia.  Admitted for clinical dehydration, acute renal failure, electrolyte abnormalities and tachycardia.   Assessment & Plan:   Principal Problem:   Acute kidney injury superimposed on CKD ([place]) Active Problems:   Hyperlipidemia   Essential hypertension   COPD ([place])   Acute on chronic diastolic CHF (congestive heart failure) (H[facility])   Leg pain   Pulmonary hypertension ([facility])   Multifocal atrial tachycardia ([facility])  Acute kidney injury on chronic kidney disease stage III:  -Likely secondary to dehydration, questionable over-diuresis -Status post treatment IV fluid with good response.  -Sodium and chloride improved.  -Now back on essentially half dose diuretics per cardiology, certainly reasonable given resolution of symptoms as above  Chronic diastolic heart failure, not in acute exacerbation:  Recent echocardiogram with normal ejection fraction.   Patient on significant high dose of torsemide 100 mg twice daily and metolazone.   Gently hydrated with improvement. Cardiology following, appreciate insight and recommendations -restart torsemide at 40 mg p.o. twice daily  Multifocal atrial tachycardia: Initially thought to be rapid A.  Fib but cardiology indicating more consistent with multifocal atrial tachycardia Rate moderately well controlled currently on oral Cardizem.   Followed by cardiology.    Pulmonary hypertension:  On 2 L of oxygen at baseline.   Outpatient work-up pending.  Deconditioning/ambulatory dysfunction/weakness:  PT/OT following  COPD: Stable, not in acute exacerbation  Hypokalemia: Replaced.  Improved.   DVT prophylaxis: Lovenox subcu Code Status: DNR Disposition Plan: Back to assisted living facility with therapies pending clinical improvement.  Consultants:   Cardiology  Procedures:   None  Antimicrobials:   None   Subjective: No acute issues or events overnight, denies chest pain, shortness of breath, nausea, vomiting, diarrhea, constipation, headache, fever, chills.  Objective: Vitals:   09/09/18 0623 09/09/18 1509 09/09/18 1941 09/10/18 0500  BP: 117/83 129/79 (!) 122/93 (!) 147/80  Pulse: 95 84 70 91  Resp: 17 18 (!) 33 16  Temp: (!) 97.4 F (36.3 C) 97.7 F (36.5 C) 97.9 F (36.6 C) 98.2 F (36.8 C)  TempSrc: Axillary Oral Oral Oral  SpO2: 95% 98% 92% 98%  Weight: 93.7 kg   93.1 kg  Height:        Intake/Output Summary (Last 24 hours) at 09/10/2018 1332 Last data filed at 09/10/2018 0512 Gross per 24 hour  Intake 600 ml  Output 1530 ml  Net -930 ml   Filed Weights   09/08/18 0500 09/09/18 0623 09/10/18 0500  Weight: 92.3 kg 93.7 kg 93.1 kg    Examination:  General exam: Appears calm and comfortable, on 2 L nasal cannula oxygen however comfortable. Respiratory system: Clear to auscultation. Respiratory effort normal.  No added sounds. Cardiovascular system: S1 & S2 heard, RRR.  Tachycardia.  No JVD, murmurs, rubs,  gallops or clicks. No pedal edema. Gastrointestinal system: Abdomen is nondistended, soft and nontender. No organomegaly or masses felt. Normal bowel sounds heard. Central nervous system: Alert and oriented. No focal neurological  deficits. Extremities: Symmetric 5 x 5 power. Skin: No rashes, lesions or ulcers Psychiatry: Judgement and insight appear normal. Mood & affect appropriate.   Data Reviewed: I have personally reviewed following labs and imaging studies  CBC: Recent Labs  Lab 09/07/18 1241 09/08/18 0831 09/09/18 0352 09/10/18 0356  WBC 16.6* 14.5* 10.9* 9.7  NEUTROABS 13.9* 12.0* 8.6* 7.8*  HGB 16.2 16.0 15.3 15.1  HCT 50.2 48.8 48.1 48.9  MCV 87.9 88.4 91.3 92.6  PLT 240 254 226 542   Basic Metabolic Panel: Recent Labs  Lab 09/07/18 1241 09/08/18 0831 09/09/18 0352 09/10/18 0356  NA 123* 127* 131* 131*  K 3.6 3.3* 3.4* 3.7  CL 74* 73* 81* 83*  CO2 32 38* 38* 36*  GLUCOSE 403* 350* 232* 190*  BUN 104* 93* 82* 52*  CREATININE 2.26* 1.96* 1.69* 1.40*  CALCIUM 8.4* 8.1* 8.3* 8.4*  MG  --  2.5*  --   --    GFR: Estimated Creatinine Clearance: 42.7 mL/min (A) (by C-G formula based on SCr of 1.4 mg/dL (H)). Liver Function Tests: Recent Labs  Lab 09/07/18 1241  AST 28  ALT 27  ALKPHOS 125  BILITOT 0.4  PROT 6.8  ALBUMIN 3.5   No results for input(s): LIPASE, AMYLASE in the last 168 hours. No results for input(s): AMMONIA in the last 168 hours. Coagulation Profile: No results for input(s): INR, PROTIME in the last 168 hours. Cardiac Enzymes: No results for input(s): CKTOTAL, CKMB, CKMBINDEX, TROPONINI in the last 168 hours. BNP (last 3 results) No results for input(s): PROBNP in the last 8760 hours. HbA1C: Recent Labs    09/07/18 2308  HGBA1C 8.6*   CBG: Recent Labs  Lab 09/09/18 1202 09/09/18 1639 09/09/18 2119 09/10/18 0740 09/10/18 1058  GLUCAP 171* 160* 235* 185* 286*   Recent Results (from the past 240 hour(s))  SARS Coronavirus 2 [facility] order, Performed in [facility] hospital lab) Nasopharyngeal Nasopharyngeal Swab     Status: None   Collection Time: 09/07/18 12:42 PM   Specimen: Nasopharyngeal Swab  Result Value Ref Range Status   SARS Coronavirus 2  NEGATIVE NEGATIVE Final    Comment: (NOTE) If result is NEGATIVE SARS-CoV-2 target nucleic acids are NOT DETECTED. The SARS-CoV-2 RNA is generally detectable in upper and lower  respiratory specimens during the acute phase of infection. The lowest  concentration of SARS-CoV-2 viral copies this assay can detect is 250  copies / mL. A negative result does not preclude SARS-CoV-2 infection  and should not be used as the sole basis for treatment or other  patient management decisions.  A negative result may occur with  improper specimen collection / handling, submission of specimen other  than nasopharyngeal swab, presence of viral mutation(s) within the  areas targeted by this assay, and inadequate number of viral copies  (<250 copies / mL). A negative result must be combined with clinical  observations, patient history, and epidemiological information. If result is POSITIVE SARS-CoV-2 target nucleic acids are DETECTED. The SARS-CoV-2 RNA is generally detectable in upper and lower  respiratory specimens dur ing the acute phase of infection.  Positive  results are indicative of active infection with SARS-CoV-2.  Clinical  correlation with patient history and other diagnostic information is  necessary to determine patient infection status.  Positive results  do  not rule out bacterial infection or co-infection with other viruses. If result is PRESUMPTIVE POSTIVE SARS-CoV-2 nucleic acids MAY BE PRESENT.   A presumptive positive result was obtained on the submitted specimen  and confirmed on repeat testing.  While 2019 novel coronavirus  (SARS-CoV-2) nucleic acids may be present in the submitted sample  additional confirmatory testing may be necessary for epidemiological  and / or clinical management purposes  to differentiate between  SARS-CoV-2 and other Sarbecovirus currently known to infect humans.  If clinically indicated additional testing with an alternate test  methodology [phone])  is advised. The SARS-CoV-2 RNA is generally  detectable in upper and lower respiratory sp ecimens during the acute  phase of infection. The expected result is Negative. Fact Sheet for Patients:  StrictlyIdeas.no Fact Sheet for Healthcare Providers: BankingDealers.co.za This test is not yet approved or cleared by the [place] FDA and has been authorized for detection and/or diagnosis of SARS-CoV-2 by FDA under an Emergency Use Authorization (EUA).  This EUA will remain in effect (meaning this test can be used) for the duration of the COVID-19 declaration under Section 564(b)(1) of the Act, 21 U.S.C. section 360bbb-3(b)(1), unless the authorization is terminated or revoked sooner. Performed at [facility], [place] [address], [place], [place] [address]   MRSA PCR Screening     Status: None   Collection Time: 09/07/18 11:19 PM   Specimen: Nasal Mucosa; Nasopharyngeal  Result Value Ref Range Status   MRSA by PCR NEGATIVE NEGATIVE Final    Comment:        The GeneXpert MRSA Assay (FDA approved for NASAL specimens only), is one component of a comprehensive MRSA colonization surveillance program. It is not intended to diagnose MRSA infection nor to guide or monitor treatment for MRSA infections. Performed at [facility], [place] [address], [place], [place] [address]     Radiology Studies: Dg Chest 2 View  Result Date: 09/09/2018 CLINICAL DATA:  Shortness of breath. EXAM: CHEST - 2 VIEW COMPARISON:  September 07, 2018 FINDINGS: The left hemidiaphragm is elevated. The known large hiatal hernia is seen under the left hemidiaphragm. There is adjacent atelectasis. These findings are stable. The lungs, heart, hila, and mediastinum are otherwise unchanged. IMPRESSION: Elevated left hemidiaphragm with adjacent atelectasis. Large hiatal hernia. No other abnormalities. Electronically Signed   By: Dorise Bullion III M.D   On: 09/09/2018 16:28    Nm Pulmonary Perfusion  Result Date: 09/09/2018 CLINICAL DATA:  Pulmonary fibrosis.  Shortness of breath. EXAM: NUCLEAR MEDICINE PERFUSION LUNG SCAN TECHNIQUE: Perfusion images were obtained in multiple projections after intravenous injection of radiopharmaceutical. Ventilation scans intentionally deferred if perfusion scan and chest x-ray adequate for interpretation during COVID 19 epidemic. RADIOPHARMACEUTICALS:  1.5 mCi Tc-76mMAA IV COMPARISON:  Chest x-ray September 09, 2018. CT of the abdomen and pelvis March 08, 2017. FINDINGS: A defect over the left base correlates with the large hiatal hernia seen on previous CT imaging. No suspicious defects identified. IMPRESSION: No evidence of pulmonary embolus. Electronically Signed   By: DDorise BullionIII M.D   On: 09/09/2018 16:27   Scheduled Meds:  allopurinol  50 mg Oral Daily   diltiazem  360 mg Oral Daily   enoxaparin (LOVENOX) injection  40 mg Subcutaneous Q24H   finasteride  5 mg Oral QHS   FLUoxetine  10 mg Oral Daily   insulin aspart  0-15 Units Subcutaneous TID WC   insulin aspart  0-5 Units Subcutaneous QHS  nystatin  5 mL Oral QID   pantoprazole  40 mg Oral Daily   polyethylene glycol  17 g Oral Daily   pramipexole  0.75 mg Oral QHS   spironolactone  12.5 mg Oral Daily   traZODone  100 mg Oral QHS    LOS: 3 days   Time spent: 25 minutes  Little Ishikawa, DO [facility] Pager [phone]  If 7PM-7AM, please contact night-coverage [email or web address] Password [email or web address] 09/10/2018, 1:32 PM

## 2018-09-10 NOTE — Progress Notes (Signed)
Patient ID: KEMP GOMES, male   DOB: [birth year], 85 y.o.   MRN: [id number]     Advanced Heart Failure Rounding Note  PCP-Cardiologist: [name], MD   Subjective:    Resting comfortably this am. Denies SOB, orthopnea or PND. IVF stopped. Off diuretics.  Weight down 1 pound  Creatinine down to 1.4  Objective:   Weight Range: 93.1 kg Body mass index is 32.14 kg/m.   Vital Signs:   Temp:  [97.7 F (36.5 C)-98.2 F (36.8 C)] 98.2 F (36.8 C) (08/09 0500) Pulse Rate:  [70-91] 91 (08/09 0500) Resp:  [16-33] 16 (08/09 0500) BP: (122-147)/(79-93) 147/80 (08/09 0500) SpO2:  [92 %-98 %] 98 % (08/09 0500) Weight:  [93.1 kg] 93.1 kg (08/09 0500) Last BM Date: 09/06/18  Weight change: Filed Weights   09/08/18 0500 09/09/18 0623 09/10/18 0500  Weight: 92.3 kg 93.7 kg 93.1 kg    Intake/Output:   Intake/Output Summary (Last 24 hours) at 09/10/2018 0633 Last data filed at 09/10/2018 0512 Gross per 24 hour  Intake 1080 ml  Output 2080 ml  Net -1000 ml      Physical Exam    General:  Lying flat in bed  No resp difficulty HEENT: normal Neck: supple. JVP 8-9. Carotids 2+ bilat; no bruits. No lymphadenopathy or thryomegaly appreciated. Cor: PMI nondisplaced. Irregular rate & rhythm. No rubs, gallops or murmurs. Lungs: decreased throughout  Abdomen: soft, nontender, nondistended. No hepatosplenomegaly. No bruits or masses. Good bowel sounds. Extremities: no cyanosis, clubbing, rash, tr edema Neuro: alert & orientedx3, cranial nerves grossly intact. moves all 4 extremities w/o difficulty. Affect pleasant    Telemetry   Multifocal atrial tachycardia with rates in 90s Personally reviewed   Labs    CBC Recent Labs    09/09/18 0352 09/10/18 0356  WBC 10.9* 9.7  NEUTROABS 8.6* 7.8*  HGB 15.3 15.1  HCT 48.1 48.9  MCV 91.3 92.6  PLT 226 409   Basic Metabolic Panel Recent Labs    09/08/18 0831 09/09/18 0352 09/10/18 0356  NA 127* 131* 131*  K 3.3* 3.4* 3.7    CL 73* 81* 83*  CO2 38* 38* 36*  GLUCOSE 350* 232* 190*  BUN 93* 82* 52*  CREATININE 1.96* 1.69* 1.40*  CALCIUM 8.1* 8.3* 8.4*  MG 2.5*  --   --    Liver Function Tests Recent Labs    09/07/18 1241  AST 28  ALT 27  ALKPHOS 125  BILITOT 0.4  PROT 6.8  ALBUMIN 3.5   No results for input(s): LIPASE, AMYLASE in the last 72 hours. Cardiac Enzymes No results for input(s): CKTOTAL, CKMB, CKMBINDEX, TROPONINI in the last 72 hours.  BNP: BNP (last 3 results) Recent Labs    03/08/18 1859 07/19/18 1750 09/07/18 1241  BNP 40.3 36.1 40.6    ProBNP (last 3 results) No results for input(s): PROBNP in the last 8760 hours.   D-Dimer No results for input(s): DDIMER in the last 72 hours. Hemoglobin A1C Recent Labs    09/07/18 2308  HGBA1C 8.6*   Fasting Lipid Panel No results for input(s): CHOL, HDL, LDLCALC, TRIG, CHOLHDL, LDLDIRECT in the last 72 hours. Thyroid Function Tests No results for input(s): TSH, T4TOTAL, T3FREE, THYROIDAB in the last 72 hours.  Invalid input(s): FREET3  Other results:   Imaging    Dg Chest 2 View  Result Date: 09/09/2018 CLINICAL DATA:  Shortness of breath. EXAM: CHEST - 2 VIEW COMPARISON:  September 07, 2018 FINDINGS: The left hemidiaphragm is  elevated. The known large hiatal hernia is seen under the left hemidiaphragm. There is adjacent atelectasis. These findings are stable. The lungs, heart, hila, and mediastinum are otherwise unchanged. IMPRESSION: Elevated left hemidiaphragm with adjacent atelectasis. Large hiatal hernia. No other abnormalities. Electronically Signed   By: Dorise Bullion III M.D   On: 09/09/2018 16:28   Nm Pulmonary Perfusion  Result Date: 09/09/2018 CLINICAL DATA:  Pulmonary fibrosis.  Shortness of breath. EXAM: NUCLEAR MEDICINE PERFUSION LUNG SCAN TECHNIQUE: Perfusion images were obtained in multiple projections after intravenous injection of radiopharmaceutical. Ventilation scans intentionally deferred if perfusion scan  and chest x-ray adequate for interpretation during COVID 19 epidemic. RADIOPHARMACEUTICALS:  1.5 mCi Tc-7mMAA IV COMPARISON:  Chest x-ray September 09, 2018. CT of the abdomen and pelvis March 08, 2017. FINDINGS: A defect over the left base correlates with the large hiatal hernia seen on previous CT imaging. No suspicious defects identified. IMPRESSION: No evidence of pulmonary embolus. Electronically Signed   By: DDorise BullionIII M.D   On: 09/09/2018 16:27     Medications:     Scheduled Medications:  allopurinol  50 mg Oral Daily   diltiazem  360 mg Oral Daily   enoxaparin (LOVENOX) injection  30 mg Subcutaneous Q24H   finasteride  5 mg Oral QHS   FLUoxetine  10 mg Oral Daily   insulin aspart  0-15 Units Subcutaneous TID WC   insulin aspart  0-5 Units Subcutaneous QHS   nystatin  5 mL Oral QID   pantoprazole  40 mg Oral Daily   polyethylene glycol  17 g Oral Daily   pramipexole  0.75 mg Oral QHS   spironolactone  12.5 mg Oral Daily   traZODone  100 mg Oral QHS    Infusions:   PRN Medications: acetaminophen, docusate sodium, morphine injection, ondansetron (ZOFRAN) IV, traMADol, triazolam   Assessment/Plan   1. AKI on CKD stage 3: He does not appear volume overloaded on exam.  He was on an aggressive diuretic regimen at home and had appeared to be diuretic resistant but creatinine had been remaining stable.  He tells me that he lost "15 lbs" in 1 day earlier this week and since then has been weak/lethargic. He was admitted with markedly elevated BUN and creatinine.  Renal function improved with IVF and holding diuretics. Creatinine 1.40 - Creatinine baseline ~1.5. - Restart torsemide at 40 bid.(was on 100 bid PTA) - watch renal function closely. - Suspect hyperglycemia may be predisposing to volume depletion. 2. Chronic diastolic CHF: Suspect with significant component of RV failure from pulmonary hypertension. Echo in 6/20 with EF 60-65%, RV not well-visualized  but PASP 59 mmHg.RHC in 6/20 showedfilling pressures optimized, but after hospital discharge, he gained significant weight.  His diuretics had been increased significantly, up to torsemide 100 bid and metolazone three days/week with diuretic resistance.  This admission, he has AKI and does not appear volume overloaded (leg swelling much decreased since I last saw him).  - Much improved with IVF. Now taking good po. Creatinine improved. Off diuretics  - Will restart torsemide at 40 bid.(was on 100 bid PTA) - watch renal function closely. -Can continue spironolactone 12.5 daily with hypokalemia.  3. Pulmonary hypertnesion: Patient thought to have P[place] of proportion to his lung disease (ILD, NSIP versus UIP). RHC in 2/20 showed mean PA pressure 41, PVR 5.1 WU, CI 3. Echo in 6/20 with RV reportedly ok =>I reviewed and do not think that RV was well-visualized. Patient did not tolerate  Adcirca (started at [facility]) due to edema/?cognitive effects. RHC was done again in 6/20, showed moderate pulmonary arterial hypertension with PVR 5.35 (comparable to prior).  Serological workup for PH was negative. Patient started ambrisentan 5 mg and developed significant edema/volume overload despite a high dose of torsemide.  I am concerned that ambrisentan may have been part of the reason for the peripheral edema/volume overload.  - he will stay off ambrisentan and Adcirca (?trigger for volume overload). - V/Q scan to rule out chronic PEs pending.   - Home sleep study ordered.  - When  stable, can consider Uptravi (intolerant of Adcirca, Letairis).  4. MAT   - Rate in 90s on diltiazem 360  5. Hypokalemia - k 3.7 supp 6. DM2, poorly controlled  - Hgba1c 8.6 - TRH managing   Length of Stay: 3  Glori Bickers, MD  09/10/2018, 6:33 AM  Advanced Heart Failure Team Pager [phone] (M-F; 7a - 4p)  Please contact [facility] for night-coverage after hours (4p -7a ) and weekends on [email or web address]

## 2018-09-11 ENCOUNTER — Telehealth (HOSPITAL_COMMUNITY): Payer: Self-pay | Admitting: *Deleted

## 2018-09-11 ENCOUNTER — Encounter (HOSPITAL_COMMUNITY): Payer: Medicare HMO | Admitting: Cardiology

## 2018-09-11 DIAGNOSIS — N179 Acute kidney failure, unspecified: Secondary | ICD-10-CM

## 2018-09-11 DIAGNOSIS — N189 Chronic kidney disease, unspecified: Secondary | ICD-10-CM

## 2018-09-11 DIAGNOSIS — I5033 Acute on chronic diastolic (congestive) heart failure: Secondary | ICD-10-CM

## 2018-09-11 LAB — CBC WITH DIFFERENTIAL/PLATELET
Abs Immature Granulocytes: 0.13 10*3/uL — ABNORMAL HIGH (ref 0.00–0.07)
Basophils Absolute: 0 10*3/uL (ref 0.0–0.1)
Basophils Relative: 0 %
Eosinophils Absolute: 0.1 10*3/uL (ref 0.0–0.5)
Eosinophils Relative: 1 %
HCT: 48.7 % (ref 39.0–52.0)
Hemoglobin: 15.1 g/dL (ref 13.0–17.0)
Immature Granulocytes: 1 %
Lymphocytes Relative: 18 %
Lymphs Abs: 1.7 10*3/uL (ref 0.7–4.0)
MCH: 28.9 pg (ref 26.0–34.0)
MCHC: 31 g/dL (ref 30.0–36.0)
MCV: 93.1 fL (ref 80.0–100.0)
Monocytes Absolute: 0.8 10*3/uL (ref 0.1–1.0)
Monocytes Relative: 8 %
Neutro Abs: 6.8 10*3/uL (ref 1.7–7.7)
Neutrophils Relative %: 72 %
Platelets: 204 10*3/uL (ref 150–400)
RBC: 5.23 MIL/uL (ref 4.22–5.81)
RDW: 15.4 % (ref 11.5–15.5)
WBC: 9.6 10*3/uL (ref 4.0–10.5)
nRBC: 0 % (ref 0.0–0.2)

## 2018-09-11 LAB — BASIC METABOLIC PANEL
Anion gap: 15 (ref 5–15)
BUN: 45 mg/dL — ABNORMAL HIGH (ref 8–23)
CO2: 34 mmol/L — ABNORMAL HIGH (ref 22–32)
Calcium: 8.3 mg/dL — ABNORMAL LOW (ref 8.9–10.3)
Chloride: 82 mmol/L — ABNORMAL LOW (ref 98–111)
Creatinine, Ser: 1.55 mg/dL — ABNORMAL HIGH (ref 0.61–1.24)
GFR calc Af Amer: 47 mL/min — ABNORMAL LOW (ref >60)
GFR calc non Af Amer: 41 mL/min — ABNORMAL LOW (ref >60)
Glucose, Bld: 185 mg/dL — ABNORMAL HIGH (ref 70–99)
Potassium: 3.8 mmol/L (ref 3.5–5.1)
Sodium: 131 mmol/L — ABNORMAL LOW (ref 135–145)

## 2018-09-11 LAB — GLUCOSE, CAPILLARY
Glucose-Capillary: 255 mg/dL — ABNORMAL HIGH (ref 70–99)
Glucose-Capillary: 217 mg/dL — ABNORMAL HIGH (ref 70–99)
Glucose-Capillary: 184 mg/dL — ABNORMAL HIGH (ref 70–99)
Glucose-Capillary: 175 mg/dL — ABNORMAL HIGH (ref 70–99)
Glucose-Capillary: 240 mg/dL — ABNORMAL HIGH (ref 70–99)

## 2018-09-11 MED ORDER — LIVING WELL WITH DIABETES BOOK
Freq: Once | Status: AC
  Administered 2018-09-11: 22:00:00
  Filled 2018-09-11: qty 1

## 2018-09-11 MED ORDER — TORSEMIDE 20 MG PO TABS
60.0000 mg | ORAL_TABLET | Freq: Two times a day (BID) | ORAL | Status: DC
  Administered 2018-09-11 – 2018-09-12 (×4): 60 mg via ORAL
  Filled 2018-09-11 (×4): qty 3

## 2018-09-11 MED ORDER — POTASSIUM CHLORIDE CRYS ER 20 MEQ PO TBCR
20.0000 meq | EXTENDED_RELEASE_TABLET | Freq: Once | ORAL | Status: AC
  Administered 2018-09-11: 09:00:00 20 meq via ORAL
  Filled 2018-09-11: qty 1

## 2018-09-11 NOTE — Care Management Important Message (Signed)
Important Message  Patient Details  Name: Ryan [name] MRN: [id number] Date of Birth: 01/05/1932   Medicare Important Message Given:  Yes     [name] 09/11/2018, 3:12 PM

## 2018-09-11 NOTE — Telephone Encounter (Signed)
[name] called left VM requesting call directly from [place] about patients plan of care. Dr.McLean aware and will call her.

## 2018-09-11 NOTE — Progress Notes (Signed)
PROGRESS NOTE    Ryan [name]  IWP:[id number] DOB: October 12, 1931 DOA: 09/07/2018 PCP: Chesley Noon, MD    Brief Narrative:  85 year old gentleman with history of non-Hodgkin's lymphoma status post radiation therapy, hypertension, COPD/pulmonary fibrosis on home oxygen, hyperlipidemia from assisted living facility presented to the emergency room with bilateral leg cramps and thigh pain.  Reported falls.  Poor appetite for last several days and living mostly on juice and fruit.  Patient also complained that he has not peed for 2 days.  Patient has chronic low back pain but he has been having more myalgia currently. In the emergency room he was clinically dehydrated, had tachycardia initially thought to be rapid A. fib, turned out to be rapid atrial tachycardia.  Admitted for clinical dehydration, acute renal failure, electrolyte abnormalities and tachycardia.   Assessment & Plan:   Principal Problem:   Acute kidney injury superimposed on CKD ([place]) Active Problems:   Hyperlipidemia   Essential hypertension   COPD ([place])   Acute on chronic diastolic CHF (congestive heart failure) (H[facility])   Leg pain   Pulmonary hypertension ([facility])   Multifocal atrial tachycardia ([facility])    Acute kidney injury on chronic kidney disease stage III:  -Likely secondary to dehydration, questionable over-diuresis -Status post treatment IV fluid with good response.  -Sodium and chloride improved with IV fluids and decreased diuretic dosing -Now back on essentially half dose diuretics per cardiology as below, certainly reasonable given resolution of symptoms as above  Chronic diastolic heart failure, not in acute exacerbation:  -Recent echocardiogram with normal ejection fraction.   -Patient on significant high dose of torsemide 100 mg twice daily and metolazone.   -Gently hydrated with improvement. -Cardiology following, appreciate insight and recommendations -restart torsemide at 40 mg p.o. twice daily  -Lengthy discussion at bedside today about need for fluid restriction at home as patient indicates he drinks "what ever I want" and this is likely the cause of patient's marked fluctuations in volume and weight.  Multifocal atrial tachycardia: -Initially thought to be rapid A. Fib but cardiology indicating more consistent with multifocal atrial tachycardia -Rate moderately well controlled currently on oral Cardizem.   -Followed by cardiology.    Pulmonary hypertension Likely concurrent sleep apnea; previously undiagnosed -On 2 L of oxygen at baseline.   -Outpatient work-up pending for possibly concurrent sleep apnea. -Patient continues to desat here in the hospital into the 70s and 80s overnight while on 2 L nasal cannula -patient insists that he has passed multiple home sleep studies although unclear when these were or how these tests were performed given they were home studies -Patient indicates at home he wakes up at least once every 60 to 90 minutes -this has been chronic for some years apparently  Deconditioning/ambulatory dysfunction/weakness:  PT/OT following   COPD: Stable, not in acute exacerbation  Hypokalemia:  Likely in the setting of diuresis  Previously repleted.  DVT prophylaxis: Lovenox subcu Code Status: DNR Disposition Plan: Back to assisted living facility with 24-hour supervision versus skilled nursing facility.  Likely medically stable for discharge in the next 24 to 48 hours course.  Consultants:   Cardiology  Procedures:   None  Antimicrobials:   None  Subjective: Nursing indicates patient continues to desat into the 70s and 80s overnight on pulse ox.  No acute issues or events overnight per the patient, denies chest pain, shortness of breath, nausea, vomiting, diarrhea, constipation, headache, fever, chills.  Objective: Vitals:   09/10/18 0800 09/10/18 1432 09/10/18 2047 09/11/18  0615  BP:  130/78 111/78   Pulse: 78 87 81   Resp: (!) 29 (!) 21  20   Temp:  98 F (36.7 C) 98.3 F (36.8 C) 98.7 F (37.1 C)  TempSrc:  Oral Oral Oral  SpO2: 96% 98% 96%   Weight:    92 kg  Height:        Intake/Output Summary (Last 24 hours) at 09/11/2018 0811 Last data filed at 09/11/2018 0630 Gross per 24 hour  Intake 924 ml  Output 2000 ml  Net -1076 ml   Filed Weights   09/09/18 0623 09/10/18 0500 09/11/18 0615  Weight: 93.7 kg 93.1 kg 92 kg    Examination:  General exam: Appears calm and comfortable, on 2 L nasal cannula oxygen however comfortable. Respiratory system: Clear to auscultation. Respiratory effort normal.  No added sounds. Cardiovascular system: S1 & S2 heard, RRR.  Tachycardia.  No JVD, murmurs, rubs, gallops or clicks. No pedal edema. Gastrointestinal system: Abdomen is nondistended, soft and nontender. No organomegaly or masses felt. Normal bowel sounds heard. Central nervous system: Alert and oriented. No focal neurological deficits. Extremities: Symmetric 5 x 5 power. Skin: No rashes, lesions or ulcers Psychiatry: Judgement and insight appear normal. Mood & affect appropriate.   Data Reviewed: I have personally reviewed following labs and imaging studies  CBC: Recent Labs  Lab 09/07/18 1241 09/08/18 0831 09/09/18 0352 09/10/18 0356 09/11/18 0512  WBC 16.6* 14.5* 10.9* 9.7 9.6  NEUTROABS 13.9* 12.0* 8.6* 7.8* 6.8  HGB 16.2 16.0 15.3 15.1 15.1  HCT 50.2 48.8 48.1 48.9 48.7  MCV 87.9 88.4 91.3 92.6 93.1  PLT 240 254 226 206 832   Basic Metabolic Panel: Recent Labs  Lab 09/07/18 1241 09/08/18 0831 09/09/18 0352 09/10/18 0356 09/11/18 0512  NA 123* 127* 131* 131* 131*  K 3.6 3.3* 3.4* 3.7 3.8  CL 74* 73* 81* 83* 82*  CO2 32 38* 38* 36* 34*  GLUCOSE 403* 350* 232* 190* 185*  BUN 104* 93* 82* 52* 45*  CREATININE 2.26* 1.96* 1.69* 1.40* 1.55*  CALCIUM 8.4* 8.1* 8.3* 8.4* 8.3*  MG  --  2.5*  --   --   --    GFR: Estimated Creatinine Clearance: 38.4 mL/min (A) (by C-G formula based on SCr of 1.55  mg/dL (H)). Liver Function Tests: Recent Labs  Lab 09/07/18 1241  AST 28  ALT 27  ALKPHOS 125  BILITOT 0.4  PROT 6.8  ALBUMIN 3.5   CBG: Recent Labs  Lab 09/10/18 0740 09/10/18 1058 09/10/18 1646 09/10/18 2046 09/11/18 0738  GLUCAP 185* 286* 173* 203* 255*   Recent Results (from the past 240 hour(s))  SARS Coronavirus 2 [facility] order, Performed in [facility] hospital lab) Nasopharyngeal Nasopharyngeal Swab     Status: None   Collection Time: 09/07/18 12:42 PM   Specimen: Nasopharyngeal Swab  Result Value Ref Range Status   SARS Coronavirus 2 NEGATIVE NEGATIVE Final    Comment: (NOTE) If result is NEGATIVE SARS-CoV-2 target nucleic acids are NOT DETECTED. The SARS-CoV-2 RNA is generally detectable in upper and lower  respiratory specimens during the acute phase of infection. The lowest  concentration of SARS-CoV-2 viral copies this assay can detect is 250  copies / mL. A negative result does not preclude SARS-CoV-2 infection  and should not be used as the sole basis for treatment or other  patient management decisions.  A negative result may occur with  improper specimen collection / handling, submission of specimen other  than nasopharyngeal swab, presence of viral mutation(s) within the  areas targeted by this assay, and inadequate number of viral copies  (<250 copies / mL). A negative result must be combined with clinical  observations, patient history, and epidemiological information. If result is POSITIVE SARS-CoV-2 target nucleic acids are DETECTED. The SARS-CoV-2 RNA is generally detectable in upper and lower  respiratory specimens dur ing the acute phase of infection.  Positive  results are indicative of active infection with SARS-CoV-2.  Clinical  correlation with patient history and other diagnostic information is  necessary to determine patient infection status.  Positive results do  not rule out bacterial infection or co-infection with other viruses.  If result is PRESUMPTIVE POSTIVE SARS-CoV-2 nucleic acids MAY BE PRESENT.   A presumptive positive result was obtained on the submitted specimen  and confirmed on repeat testing.  While 2019 novel coronavirus  (SARS-CoV-2) nucleic acids may be present in the submitted sample  additional confirmatory testing may be necessary for epidemiological  and / or clinical management purposes  to differentiate between  SARS-CoV-2 and other Sarbecovirus currently known to infect humans.  If clinically indicated additional testing with an alternate test  methodology [phone]) is advised. The SARS-CoV-2 RNA is generally  detectable in upper and lower respiratory sp ecimens during the acute  phase of infection. The expected result is Negative. Fact Sheet for Patients:  StrictlyIdeas.no Fact Sheet for Healthcare Providers: BankingDealers.co.za This test is not yet approved or cleared by the [place] FDA and has been authorized for detection and/or diagnosis of SARS-CoV-2 by FDA under an Emergency Use Authorization (EUA).  This EUA will remain in effect (meaning this test can be used) for the duration of the COVID-19 declaration under Section 564(b)(1) of the Act, 21 U.S.C. section 360bbb-3(b)(1), unless the authorization is terminated or revoked sooner. Performed at [facility], [place] [address] [place], [place] [address]   MRSA PCR Screening     Status: None   Collection Time: 09/07/18 11:19 PM   Specimen: Nasal Mucosa; Nasopharyngeal  Result Value Ref Range Status   MRSA by PCR NEGATIVE NEGATIVE Final    Comment:        The GeneXpert MRSA Assay (FDA approved for NASAL specimens only), is one component of a comprehensive MRSA colonization surveillance program. It is not intended to diagnose MRSA infection nor to guide or monitor treatment for MRSA infections. Performed at [facility], [place] [address] [place], [place]  [address]     Radiology Studies: Dg Chest 2 View  Result Date: 09/09/2018 CLINICAL DATA:  Shortness of breath. EXAM: CHEST - 2 VIEW COMPARISON:  September 07, 2018 FINDINGS: The left hemidiaphragm is elevated. The known large hiatal hernia is seen under the left hemidiaphragm. There is adjacent atelectasis. These findings are stable. The lungs, heart, hila, and mediastinum are otherwise unchanged. IMPRESSION: Elevated left hemidiaphragm with adjacent atelectasis. Large hiatal hernia. No other abnormalities. Electronically Signed   By: Dorise Bullion III M.D   On: 09/09/2018 16:28   Nm Pulmonary Perfusion  Result Date: 09/09/2018 CLINICAL DATA:  Pulmonary fibrosis.  Shortness of breath. EXAM: NUCLEAR MEDICINE PERFUSION LUNG SCAN TECHNIQUE: Perfusion images were obtained in multiple projections after intravenous injection of radiopharmaceutical. Ventilation scans intentionally deferred if perfusion scan and chest x-ray adequate for interpretation during COVID 19 epidemic. RADIOPHARMACEUTICALS:  1.5 mCi Tc-46mMAA IV COMPARISON:  Chest x-ray September 09, 2018. CT of the abdomen and pelvis March 08, 2017. FINDINGS: A defect over  the left base correlates with the large hiatal hernia seen on previous CT imaging. No suspicious defects identified. IMPRESSION: No evidence of pulmonary embolus. Electronically Signed   By: Dorise Bullion III M.D   On: 09/09/2018 16:27   Scheduled Meds: . allopurinol  50 mg Oral Daily  . diltiazem  360 mg Oral Daily  . enoxaparin (LOVENOX) injection  40 mg Subcutaneous Q24H  . finasteride  5 mg Oral QHS  . FLUoxetine  10 mg Oral Daily  . insulin aspart  0-15 Units Subcutaneous TID WC  . insulin aspart  0-5 Units Subcutaneous QHS  . nystatin  5 mL Oral QID  . pantoprazole  40 mg Oral Daily  . polyethylene glycol  17 g Oral Daily  . pramipexole  0.75 mg Oral QHS  . spironolactone  12.5 mg Oral Daily  . torsemide  40 mg Oral BID  . traZODone  100 mg Oral QHS    LOS: 4 days    Time spent: 25 minutes  Little Ishikawa, DO [facility] Pager [phone]  If 7PM-7AM, please contact night-coverage [email or web address] Password [email or web address] 09/11/2018, 8:11 AM

## 2018-09-11 NOTE — Progress Notes (Signed)
Patient ID: Ryan [name], male   DOB: 01/23/1932, 85 y.o.   MRN: [id number]     Advanced Heart Failure Rounding Note  PCP-Cardiologist: [name], MD   Subjective:    Comfortable this morning.  Torsemide restarted yesterday, creatinine 1.55 today, BUN lower.   Denies dyspnea.   Objective:   Weight Range: 92 kg Body mass index is 31.76 kg/m.   Vital Signs:   Temp:  [98 F (36.7 C)-98.7 F (37.1 C)] 98.7 F (37.1 C) (08/10 0615) Pulse Rate:  [81-87] 81 (08/09 2047) Resp:  [20-21] 20 (08/09 2047) BP: (111-130)/(78) 111/78 (08/09 2047) SpO2:  [96 %-98 %] 96 % (08/09 2047) Weight:  [92 kg] 92 kg (08/10 0615) Last BM Date: 09/09/18  Weight change: Filed Weights   09/09/18 0623 09/10/18 0500 09/11/18 0615  Weight: 93.7 kg 93.1 kg 92 kg    Intake/Output:   Intake/Output Summary (Last 24 hours) at 09/11/2018 0852 Last data filed at 09/11/2018 0630 Gross per 24 hour  Intake 924 ml  Output 2000 ml  Net -1076 ml      Physical Exam    General: NAD Neck: JVP 8-9 cm, no thyromegaly or thyroid nodule.  Lungs: Clear to auscultation bilaterally with normal respiratory effort. CV: Nondisplaced PMI.  Heart irregular S1/S2, no S3/S4, no murmur.  1+ edema 1/2 to knees bilaterally.  No carotid bruit.  Normal pedal pulses.  Abdomen: Soft, nontender, no hepatosplenomegaly, no distention.  Skin: Intact without lesions or rashes.  Neurologic: Alert and oriented x 3.  Psych: Normal affect. Extremities: No clubbing or cyanosis.  HEENT: Normal.    Telemetry   NSR with frequent PACs. Personally reviewed   Labs    CBC Recent Labs    09/10/18 0356 09/11/18 0512  WBC 9.7 9.6  NEUTROABS 7.8* 6.8  HGB 15.1 15.1  HCT 48.9 48.7  MCV 92.6 93.1  PLT 206 347   Basic Metabolic Panel Recent Labs    09/10/18 0356 09/11/18 0512  NA 131* 131*  K 3.7 3.8  CL 83* 82*  CO2 36* 34*  GLUCOSE 190* 185*  BUN 52* 45*  CREATININE 1.40* 1.55*  CALCIUM 8.4* 8.3*   Liver  Function Tests No results for input(s): AST, ALT, ALKPHOS, BILITOT, PROT, ALBUMIN in the last 72 hours. No results for input(s): LIPASE, AMYLASE in the last 72 hours. Cardiac Enzymes No results for input(s): CKTOTAL, CKMB, CKMBINDEX, TROPONINI in the last 72 hours.  BNP: BNP (last 3 results) Recent Labs    03/08/18 1859 07/19/18 1750 09/07/18 1241  BNP 40.3 36.1 40.6    ProBNP (last 3 results) No results for input(s): PROBNP in the last 8760 hours.   D-Dimer No results for input(s): DDIMER in the last 72 hours. Hemoglobin A1C No results for input(s): HGBA1C in the last 72 hours. Fasting Lipid Panel No results for input(s): CHOL, HDL, LDLCALC, TRIG, CHOLHDL, LDLDIRECT in the last 72 hours. Thyroid Function Tests No results for input(s): TSH, T4TOTAL, T3FREE, THYROIDAB in the last 72 hours.  Invalid input(s): FREET3  Other results:   Imaging    No results found.   Medications:     Scheduled Medications: . allopurinol  50 mg Oral Daily  . diltiazem  360 mg Oral Daily  . enoxaparin (LOVENOX) injection  40 mg Subcutaneous Q24H  . finasteride  5 mg Oral QHS  . FLUoxetine  10 mg Oral Daily  . insulin aspart  0-15 Units Subcutaneous TID WC  . insulin aspart  0-5  Units Subcutaneous QHS  . nystatin  5 mL Oral QID  . pantoprazole  40 mg Oral Daily  . polyethylene glycol  17 g Oral Daily  . potassium chloride  20 mEq Oral Once  . pramipexole  0.75 mg Oral QHS  . spironolactone  12.5 mg Oral Daily  . torsemide  60 mg Oral BID  . traZODone  100 mg Oral QHS    Infusions:   PRN Medications: acetaminophen, docusate sodium, morphine injection, ondansetron (ZOFRAN) IV, traMADol, triazolam   Assessment/Plan   1. AKI on CKD stage 3: He does not appear volume overloaded on exam.  He was on an aggressive diuretic regimen at home and had appeared to be diuretic resistant but creatinine had been remaining stable.  He tells me that he lost "15 lbs" in 1 day earlier this  week and since then has been weak/lethargic. He was admitted with markedly elevated BUN and creatinine.  Renal function improved with IVF and holding diuretics. Creatinine mildly higher today at 1.55, BUN lower. Creatinine baseline ~1.5. - Increase torsemide to 60 mg bid and follow renal indices closely. - Suspect hyperglycemia may be predisposing to volume depletion. 2. Chronic diastolic CHF: Suspect with significant component of RV failure from pulmonary hypertension. Echo in 6/20 with EF 60-65%, RV not well-visualized but PASP 59 mmHg.RHC in 6/20 showedfilling pressures optimized, but after hospital discharge, he gained significant weight.  His diuretics had been increased significantly, up to torsemide 100 bid and metolazone three days/week with diuretic resistance.  He was admitted with AKI and diuretics held.  He appears to have built up some volume since then.  - Increase torsemide carefully to 60 mg bid.  If renal function stable tomorrow, can likely be his home dose for the time being.  -Can continue spironolactone 12.5 daily.  3. 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 RHC in 2/20 showed mean PA pressure 41, PVR 5.1 WU, CI 3. Echo in 6/20 with RV reportedly ok =>I reviewed and do not think that RV was well-visualized. Patient did not tolerate Adcirca (started at [facility]) due to edema/?cognitive effects. RHC was done again in 6/20, showed moderate pulmonary arterial hypertension with PVR 5.35 (comparable to prior).  Serological workup for PH was negative. Patient started ambrisentan 5 mg and developed significant edema/volume overload despite a high dose of torsemide.  I am concerned that ambrisentan may have been part of the reason for the peripheral edema/volume overload.  - he will stay off ambrisentan and Adcirca (?trigger for volume overload). - V/Q scan to rule out chronic PEs pending.   - Home sleep study  ordered => strong suspicion for OSA.  - When  stable, can consider Uptravi (intolerant of Adcirca, Letairis).  4. MAT: Now NSR with PACs.  - Continue diltiazem CD 360 daily.  5. Hypokalemia: Gentle supplementation.  6. DM2, poorly controlled. Hgba1c 8.6 - TRH managing 7. Deconditioning: PT recommend SNF.  ?If he can do this at his assisted living facility.  Would have PT re-evaluate today.   Length of Stay: [place], MD  09/11/2018, 8:52 AM  Advanced Heart Failure Team Pager [phone] (M-F; 7a - 4p)  Please contact [facility] for night-coverage after hours (4p -7a ) and weekends on [email or web address]

## 2018-09-12 LAB — CBC WITH DIFFERENTIAL/PLATELET
Abs Immature Granulocytes: 0.12 K/uL — ABNORMAL HIGH (ref 0.00–0.07)
Basophils Absolute: 0 K/uL (ref 0.0–0.1)
Basophils Relative: 0 %
Eosinophils Absolute: 0.1 K/uL (ref 0.0–0.5)
Eosinophils Relative: 1 %
HCT: 44.7 % (ref 39.0–52.0)
Hemoglobin: 14.2 g/dL (ref 13.0–17.0)
Immature Granulocytes: 1 %
Lymphocytes Relative: 10 %
Lymphs Abs: 0.9 K/uL (ref 0.7–4.0)
MCH: 28.7 pg (ref 26.0–34.0)
MCHC: 31.8 g/dL (ref 30.0–36.0)
MCV: 90.3 fL (ref 80.0–100.0)
Monocytes Absolute: 0.8 K/uL (ref 0.1–1.0)
Monocytes Relative: 9 %
Neutro Abs: 7 K/uL (ref 1.7–7.7)
Neutrophils Relative %: 79 %
Platelets: 199 K/uL (ref 150–400)
RBC: 4.95 MIL/uL (ref 4.22–5.81)
RDW: 15.4 % (ref 11.5–15.5)
WBC: 8.9 K/uL (ref 4.0–10.5)
nRBC: 0 % (ref 0.0–0.2)

## 2018-09-12 LAB — BASIC METABOLIC PANEL
Anion gap: 12 (ref 5–15)
BUN: 48 mg/dL — ABNORMAL HIGH (ref 8–23)
CO2: 35 mmol/L — ABNORMAL HIGH (ref 22–32)
Calcium: 8.1 mg/dL — ABNORMAL LOW (ref 8.9–10.3)
Chloride: 82 mmol/L — ABNORMAL LOW (ref 98–111)
Creatinine, Ser: 1.47 mg/dL — ABNORMAL HIGH (ref 0.61–1.24)
GFR calc Af Amer: 50 mL/min — ABNORMAL LOW (ref >60)
GFR calc non Af Amer: 43 mL/min — ABNORMAL LOW (ref >60)
Glucose, Bld: 170 mg/dL — ABNORMAL HIGH (ref 70–99)
Potassium: 3.6 mmol/L (ref 3.5–5.1)
Sodium: 129 mmol/L — ABNORMAL LOW (ref 135–145)

## 2018-09-12 LAB — GLUCOSE, CAPILLARY
Glucose-Capillary: 282 mg/dL — ABNORMAL HIGH (ref 70–99)
Glucose-Capillary: 326 mg/dL — ABNORMAL HIGH (ref 70–99)
Glucose-Capillary: 155 mg/dL — ABNORMAL HIGH (ref 70–99)

## 2018-09-12 MED ORDER — TORSEMIDE 20 MG PO TABS: 60.0000 mg | ORAL_TABLET | Freq: Two times a day (BID) | ORAL | 0 refills | Status: DC

## 2018-09-12 MED ORDER — POTASSIUM CHLORIDE ER 20 MEQ PO TBCR: 10.0000 meq | EXTENDED_RELEASE_TABLET | Freq: Every day | ORAL | 0 refills | Status: DC

## 2018-09-12 MED ORDER — DILTIAZEM HCL ER COATED BEADS 360 MG PO CP24: 360.0000 mg | ORAL_CAPSULE | Freq: Every day | ORAL | 0 refills | Status: DC

## 2018-09-12 NOTE — Progress Notes (Addendum)
Occupational Therapy Treatment Patient Details Name: Ryan [name] MRN: [id number] DOB: October 08, 1931 Today's Date: 09/12/2018    History of present illness 85 y.o. male with medical history significant of NHL  S/p radiation therapy; HTN; COPD/pulmonary fibrosis on home O2; and HLD presenting with SOB. Reports bilateral ankle pain, and weakness which resulted in fall. Admitted 09/07/18 for treatment  rapid atrial tachycardia.     OT comments  Pt making gradual progress towards OT goals, though continues to present with weakness, decreased standing balance, and decreased activity tolerance. Pt participating in functional transfer to [facility] prior to transition to recliner during session; requiring minA for completion of safe mobility using RW. Pt engaging in seated UB ADL at minA level and requiring max-totalA for toileting task. Given pt's continued deficits feel he will require hands on 24hr supervision/assist at time of discharge as pt is currently at a higher risk for falls. If his current ALF is not able to provide adequate 24hr supervision, recommend ST SNF services to maximize his safety and independence with ADL and mobility. Will follow.   Follow Up Recommendations  Supervision/Assistance - 24 hour;SNF    Equipment Recommendations  Wheelchair (measurements OT);Wheelchair cushion (measurements OT);3 in 1 bedside commode          Precautions / Restrictions Precautions Precautions: Fall Precaution Comments: reports 2 falls in 2 days just prior to admission Restrictions Weight Bearing Restrictions: No       Mobility Bed Mobility Overal bed mobility: Needs Assistance Bed Mobility: Supine to Sit     Supine to sit: Min assist     General bed mobility comments: for trunk elevation using HHA  Transfers Overall transfer level: Needs assistance Equipment used: Rolling walker (2 wheeled) Transfers: Sit to/from [facility] Sit to Stand: Min assist;+2  safety/equipment;+2 physical assistance Stand pivot transfers: Min assist       General transfer comment: pt requires increased boosting assist from EOB with VCs provided for safe hand placement; requires lesser assist to rise from [facility] with sturdier handrail; steadying assist provdied for transfer to [facility] and to take few steps to recliner post toileting     Balance Overall balance assessment: Needs assistance Sitting-balance support: Feet supported;No upper extremity supported Sitting balance-Leahy Scale: Fair     Standing balance support: Bilateral upper extremity supported Standing balance-Leahy Scale: Poor Standing balance comment: requires UE support to maintain balance; states he feels unsteady                           ADL either performed or assessed with clinical judgement   ADL Overall ADL's : Needs assistance/impaired     Grooming: Wash/dry face;Set up;Supervision/safety;Sitting Grooming Details (indicate cue type and reason): sitting EOB Upper Body Bathing: Set up;Min guard;Sitting Upper Body Bathing Details (indicate cue type and reason): sitting EOB     Upper Body Dressing : Minimal assistance;Sitting Upper Body Dressing Details (indicate cue type and reason): doffing/donning new gown     Toilet Transfer: Minimal assistance;+2 for safety/equipment;Stand-pivot;BSC;RW   Toileting- Clothing Manipulation and Hygiene: Total assistance;+2 for safety/equipment;Sit to/from stand Toileting - Clothing Manipulation Details (indicate cue type and reason): assist for standing balance with assist provided for peri-care after BM      Functional mobility during ADLs: Minimal assistance;Rolling walker General ADL Comments: pt continues to present with deconditioning and weakness, requires hands on assist for ADL and mobility tasks  Cognition Arousal/Alertness: Awake/alert Behavior During Therapy: WFL for tasks assessed/performed Overall  Cognitive Status: Impaired/Different from baseline Area of Impairment: Following commands;Safety/judgement;Awareness                       Following Commands: Follows one step commands with increased time;Follows multi-step commands with increased time Safety/Judgement: Decreased awareness of safety Awareness: Emergent   General Comments: pt appears to have increased awareness of current deficits, though continues to require cues for initiating/sequencing through tasks; pt also HOH which adds to need for repetition/cueing         Exercises     Shoulder Instructions       General Comments cued pt for slow, deep breathing as pt noted to have SOB but with quick, shallow breaths when attempting to perform diaphragmatic breathing; use of supplemental O2 during session    Pertinent Vitals/ Pain       Pain Assessment: No/denies pain  Home Living                                          Prior Functioning/Environment              Frequency  Min 2X/week        Progress Toward Goals  OT Goals(current goals can now be found in the care plan section)  Progress towards OT goals: Progressing toward goals  Acute Rehab OT Goals Patient Stated Goal: have less pain and be able to go home OT Goal Formulation: With patient Time For Goal Achievement: 09/23/18 Potential to Achieve Goals: Good  Plan Discharge plan needs to be updated    Co-evaluation                 AM-PAC OT "6 Clicks" Daily Activity     Outcome Measure   Help from another person eating meals?: None Help from another person taking care of personal grooming?: A Little Help from another person toileting, which includes using toliet, bedpan, or urinal?: A Lot Help from another person bathing (including washing, rinsing, drying)?: A Lot Help from another person to put on and taking off regular upper body clothing?: A Lot Help from another person to put on and taking off regular  lower body clothing?: Total 6 Click Score: 14    End of Session Equipment Utilized During Treatment: Gait belt;Rolling walker;Oxygen  OT Visit Diagnosis: Unsteadiness on feet (R26.81);Other abnormalities of gait and mobility (R26.89);Repeated falls (R29.6);History of falling (Z91.81)   Activity Tolerance Patient tolerated treatment well   Patient Left in chair;with call bell/phone within reach;with chair alarm set   Nurse Communication Mobility status        Time: 1210-1240 OT Time Calculation (min): 30 min  Charges: OT General Charges $OT Visit: 1 Visit OT Treatments $Self Care/Home Management : 23-37 mins  Lou Cal, OT Supplemental Rehabilitation Services Pager [phone] Office [phone]    Raymondo Band 09/12/2018, 2:20 PM

## 2018-09-12 NOTE — TOC Initial Note (Signed)
Transition of Care [facility]) - Initial/Assessment Note    Patient Details  Name: Ryan [name] MRN: [id number] Date of Birth: February 08, 1931  Transition of Care [facility]) CM/SW Contact:    Bethena Roys, RN Phone Number: 09/12/2018, 12:31 PM  Clinical Narrative: Pt presented for AKI and decreased appetite. PTA from [facility]-Pt has oxygen via [place] and they will deliver 02 to the room prior to transition home. CM did reach out to [place] liaison for [employer] and the patient will be in the living well at home program. Pt will have PT/OT via [facility] in the [employer]. Information faxed to the company. Wife will provide transportation home. No further needs from CM @ this time.        Expected Discharge Plan: [place]- with [place]) Barriers to Discharge: No Barriers Identified   Patient Goals and CMS Choice Patient states their goals for this hospitalization and ongoing recovery are:: " to return home to maintain independence"   Choice offered to / list presented to : NA  Expected Discharge Plan and Services Expected Discharge Plan: [place]- with [place]) In-house Referral: NA Discharge Planning Services: CM Consult Post Acute Care Choice: Home Health(will use living well program at [facility]) Living arrangements for the past 2 months: [place] Expected Discharge Date: 09/12/18                         HH Arranged: PT, OT [place] Agency: Other - See comment([facility]) Date HH Agency Contacted: 09/12/18 Time [place]: 1230 Representative spoke with at [place]: Rollene Fare  Prior Living Arrangements/Services Living arrangements for the past 2 months: Materials engineer Lives with:: Self Patient language and need for interpreter reviewed:: Yes Do you feel safe going back to the place where you live?:  Yes      Need for Family Participation in Patient Care: Yes (Comment) Care giver support system in place?: Yes (comment) Current home services: Home OT, Home PT Criminal Activity/Legal Involvement Pertinent to Current Situation/Hospitalization: No - Comment as needed  Activities of Daily Living Home Assistive Devices/Equipment: Eyeglasses, Oxygen, Blood pressure cuff, Walker (specify type), Wheelchair ADL Screening (condition at time of admission) Patient's cognitive ability adequate to safely complete daily activities?: Yes Is the patient deaf or have difficulty hearing?: No Does the patient have difficulty seeing, even when wearing glasses/contacts?: Yes Does the patient have difficulty concentrating, remembering, or making decisions?: Yes(memory impairment) Patient able to express need for assistance with ADLs?: Yes Does the patient have difficulty dressing or bathing?: Yes(Has assistance for ADLs at his [place] facility) Independently performs ADLs?: No Communication: Independent Dressing (OT): Needs assistance Is this a change from baseline?: Pre-admission baseline Grooming: Needs assistance Is this a change from baseline?: Pre-admission baseline Feeding: Independent Bathing: Needs assistance Is this a change from baseline?: Pre-admission baseline Toileting: Needs assistance Is this a change from baseline?: Pre-admission baseline In/Out Bed: Needs assistance Is this a change from baseline?: Pre-admission baseline Walks in Home: Needs assistance Is this a change from baseline?: Pre-admission baseline Does the patient have difficulty walking or climbing stairs?: Yes(uses walker or wheelchair) Weakness of Legs: Both(neuropathy) Weakness of Arms/Hands: None  Permission Sought/Granted Permission sought to share information with : Facility Sport and exercise psychologist, Family Supports Permission granted to share information with : Yes, Verbal Permission Granted        Permission granted to  share info w Relationship: Nevaan Bunton [phone]     Emotional Assessment Appearance:: Appears stated age Attitude/Demeanor/Rapport: Engaged Affect (typically observed): Accepting Orientation: : Oriented to Self, Oriented to Place, Oriented to Situation, Oriented to  Time Alcohol / Substance Use: Not Applicable Psych Involvement: No (comment)  Admission diagnosis:  Hyponatremia [E87.1] Hyperglycemia [R73.9] AKI (acute kidney injury) ([place]) [N17.9] Atrial fibrillation, unspecified type ([place]) [I48.91] Acute on chronic right-sided congestive heart failure ([place]) [I50.813] Atrial fibrillation with RVR ([place]) [I48.91] Patient Active Problem List   Diagnosis Date Noted  . Acute kidney injury superimposed on CKD ([place]) 09/10/2018  . Pulmonary hypertension ([place]) 09/10/2018  . Multifocal atrial tachycardia ([place]) 09/10/2018  . CHF exacerbation ([place]) 09/07/2018  . Leg pain 09/07/2018  . Hyperkalemia 07/26/2018  . Acute on chronic diastolic CHF (congestive heart failure) ([place])   . Bilateral lower extremity edema 02/02/2017  . Pulmonary fibrosis ([place]) 11/22/2016  . Blurry vision, bilateral 10/26/2016  . COPD ([place]) 07/05/2016  . Exertional dyspnea 09/10/2015  . Fatigue/malaise 08/19/2015  . Asthma/COPD 02/12/2015  . 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 Hypoxemia 01/05/2014  . Essential hypertension 01/05/2014  . Hypokalemia 01/05/2014  . Urinary retention 01/05/2014  . Hypoxia   . Lumbar radiculopathy 12/31/2013  . RLS (restless legs syndrome) 09/21/2012  . Chronic lower back pain 06/15/2012  . Abdominal pain, unspecified site 05/19/2012  . Unspecified deficiency anemia 04/07/2012  . Melanoma ([place])   . Non Hodgkin's lymphoma ([place])   . Periaortic lymphadenopathy 02/16/2011  . Barrett's esophagus 07/09/2010  . Personal history of colonic polyps 05/28/2010  . History of IBS 05/28/2010  . Diverticulosis of colon (without mention of hemorrhage) 05/28/2010  . Arthritis  involving multiple sites 05/28/2010  . Wheat intolerance 05/28/2010  . Chronic venous insufficiency 08/19/2009  . PEDAL EDEMA 08/19/2009  . INSOMNIA, CHRONIC 12/24/2008  . EUSTACHIAN TUBE DYSFUNCTION, BILATERAL 12/23/2008  . ERECTILE DYSFUNCTION, ORGANIC 08/01/2008  . Allergic rhinitis with a nonallergic component 05/20/2008  . VENTRAL HERNIA 02/12/2008  . PALPITATIONS 12/25/2007  . LACTOSE INTOLERANCE 10/17/2007  . Hyperlipidemia 09/14/2006  . BPH (benign prostatic hyperplasia) 09/14/2006  . OSTEOARTHRITIS 09/14/2006  . HEART MURMUR, HX OF 09/14/2006   PCP:  Chesley Noon, MD Pharmacy:   [facility] [place] [address] Phone: [phone] Fax: [phone]  [facility] [place], [place] - [address] [place] [address] Phone: [phone] Fax: [phone]     Social Determinants of Health (SDOH) Interventions    Readmission Risk Interventions Readmission Risk Prevention Plan 09/12/2018 07/26/2018  Transportation Screening Complete Complete  PCP or Specialist Appt within 3-5 Days Complete Complete  HRI or Home Care Consult Complete Complete  Social Work Consult for [place] Planning/Counseling Complete Complete  Palliative Care Screening Not Applicable Not Applicable  Medication Review Press photographer) Complete Complete  Some recent data might be hidden

## 2018-09-12 NOTE — Progress Notes (Signed)
Patient ID: Ryan [name], male   DOB: 10/30/1931, 85 y.o.   MRN: [id number]     Advanced Heart Failure Rounding Note  PCP-Cardiologist: [name], MD   Subjective:    Comfortable this morning.  He remains on torsemide po with stable creatinine at 1.47.   Denies dyspnea.   Objective:   Weight Range: 93.7 kg Body mass index is 32.34 kg/m.   Vital Signs:   Temp:  [97.5 F (36.4 C)-98 F (36.7 C)] 97.5 F (36.4 C) (08/11 0405) Pulse Rate:  [39-76] 42 (08/11 0615) Resp:  [15-20] 20 (08/11 0405) BP: (103-113)/(66-83) 113/73 (08/11 0405) SpO2:  [93 %-98 %] 93 % (08/11 0615) Weight:  [93.7 kg] 93.7 kg (08/11 0211) Last BM Date: 09/06/18  Weight change: Filed Weights   09/10/18 0500 09/11/18 0615 09/12/18 0211  Weight: 93.1 kg 92 kg 93.7 kg    Intake/Output:   Intake/Output Summary (Last 24 hours) at 09/12/2018 0836 Last data filed at 09/12/2018 0427 Gross per 24 hour  Intake 120 ml  Output 2200 ml  Net -2080 ml      Physical Exam    General: NAD Neck: JVP 8 cm, no thyromegaly or thyroid nodule.  Lungs: Clear to auscultation bilaterally with normal respiratory effort. CV: Nondisplaced PMI.  Heart irregular S1/S2, no S3/S4, no murmur.  1+ ankle edema.  No carotid bruit.  Normal pedal pulses.  Abdomen: Soft, nontender, no hepatosplenomegaly, no distention.  Skin: Intact without lesions or rashes.  Neurologic: Alert and oriented x 3.  Psych: Normal affect. Extremities: No clubbing or cyanosis.  HEENT: Normal.   Telemetry   NSR with frequent PACs. Personally reviewed   Labs    CBC Recent Labs    09/11/18 0512 09/12/18 0332  WBC 9.6 8.9  NEUTROABS 6.8 7.0  HGB 15.1 14.2  HCT 48.7 44.7  MCV 93.1 90.3  PLT 204 638   Basic Metabolic Panel Recent Labs    09/11/18 0512 09/12/18 0332  NA 131* 129*  K 3.8 3.6  CL 82* 82*  CO2 34* 35*  GLUCOSE 185* 170*  BUN 45* 48*  CREATININE 1.55* 1.47*  CALCIUM 8.3* 8.1*   Liver Function Tests No  results for input(s): AST, ALT, ALKPHOS, BILITOT, PROT, ALBUMIN in the last 72 hours. No results for input(s): LIPASE, AMYLASE in the last 72 hours. Cardiac Enzymes No results for input(s): CKTOTAL, CKMB, CKMBINDEX, TROPONINI in the last 72 hours.  BNP: BNP (last 3 results) Recent Labs    03/08/18 1859 07/19/18 1750 09/07/18 1241  BNP 40.3 36.1 40.6    ProBNP (last 3 results) No results for input(s): PROBNP in the last 8760 hours.   D-Dimer No results for input(s): DDIMER in the last 72 hours. Hemoglobin A1C No results for input(s): HGBA1C in the last 72 hours. Fasting Lipid Panel No results for input(s): CHOL, HDL, LDLCALC, TRIG, CHOLHDL, LDLDIRECT in the last 72 hours. Thyroid Function Tests No results for input(s): TSH, T4TOTAL, T3FREE, THYROIDAB in the last 72 hours.  Invalid input(s): FREET3  Other results:   Imaging    No results found.   Medications:     Scheduled Medications: . allopurinol  50 mg Oral Daily  . diltiazem  360 mg Oral Daily  . enoxaparin (LOVENOX) injection  40 mg Subcutaneous Q24H  . finasteride  5 mg Oral QHS  . FLUoxetine  10 mg Oral Daily  . insulin aspart  0-15 Units Subcutaneous TID WC  . insulin aspart  0-5 Units Subcutaneous  QHS  . nystatin  5 mL Oral QID  . pantoprazole  40 mg Oral Daily  . polyethylene glycol  17 g Oral Daily  . pramipexole  0.75 mg Oral QHS  . spironolactone  12.5 mg Oral Daily  . torsemide  60 mg Oral BID  . traZODone  100 mg Oral QHS    Infusions:   PRN Medications: acetaminophen, docusate sodium, morphine injection, ondansetron (ZOFRAN) IV, traMADol, triazolam   Assessment/Plan   1. AKI on CKD stage 3: He does not appear volume overloaded on exam.  He was on an aggressive diuretic regimen at home and had appeared to be diuretic resistant but creatinine had been remaining stable.  He tells me that he lost "15 lbs" in 1 day prior to admission and since then had been weak/lethargic. He was admitted  with markedly elevated BUN and creatinine, new diagnosis of diabetes.  Renal function improved with IVF and holding diuretics. Creatinine stable at 1.47 today. Creatinine baseline ~1.5. - Continue torsemide at 60 mg bid for home.  - Suspect hyperglycemia may have predisposed him to volume depletion. 2. Chronic diastolic CHF: Suspect with significant component of RV failure from pulmonary hypertension. Echo in 6/20 with EF 60-65%, RV not well-visualized but PASP 59 mmHg.RHC in 6/20 showedfilling pressures optimized, but after hospital discharge, he gained significant weight.  His diuretics had been increased significantly, up to torsemide 100 bid and metolazone three days/week with diuretic resistance.  He was admitted with AKI and diuretics held.  He appears to have built up some volume since then.  - Continue torsemide 60 mg bid, this can be his home dose. -Can continue spironolactone 12.5 daily.  3. Pulmonary hypertnesion: Patient thought to have Jessie out of proportion to his lung disease (ILD, NSIP versus UIP). RHC in 2/20 showed mean PA pressure 41, PVR 5.1 WU, CI 3. Echo in 6/20 with RV reportedly ok =>I reviewed and do not think that RV was well-visualized. Patient did not tolerate Adcirca (started at [facility]) due to edema/?cognitive effects. RHC was done again in 6/20, showed moderate pulmonary arterial hypertension with PVR 5.35 (comparable to prior).  Serological workup for PH was negative. Patient started ambrisentan 5 mg and developed significant edema/volume overload despite a high dose of torsemide.  I am concerned that ambrisentan may have been part of the reason for the peripheral edema/volume overload.  - he will stay off ambrisentan and Adcirca (?trigger for volume overload). - V/Q scan to rule out chronic PEs pending.   - Home sleep study ordered => strong suspicion for OSA.  - When  stable, can consider Uptravi (intolerant of Adcirca, Letairis).  4. MAT: Now NSR with PACs.  -  Continue diltiazem CD 360 daily.  5. Hypokalemia: Gentle supplementation.  6. DM2, poorly controlled. Hgba1c 8.6.  This is a new diagnosis and may have triggered this admission.  - TRH managing, will need to be closely followed by PCP.  7. Deconditioning: He can have increased PT and nursing at [facility] apparently.   From my standpoint, he can go home today.  Will arrange followup in CHF clinic. Cardiac meds: diltiazem CD 360 mg daily, torsemide 60 mg bid, spironolactone 12.5 daily, KCl 20 daily.  He needs to fluid restrict to < 1800 cc daily and sodium restrict to 1500 mg daily.    Length of Stay: [place], MD  09/12/2018, 8:36 AM  Advanced Heart Failure Team Pager [phone] (M-F; 7a - 4p)  Please contact [facility]  for night-coverage after hours (4p -7a ) and weekends on [email or web address]

## 2018-09-12 NOTE — Discharge Summary (Signed)
Physician Discharge Summary  Ryan [name] HQI:[id number] DOB: 1931/11/18 DOA: 09/07/2018  PCP: Ryan Noon, MD  Admit date: 09/07/2018 Discharge date: 09/12/2018  Admitted [facility]: [name] Disposition:  [facility] + HHPT  Recommendations for Outpatient Follow-up:  1. Follow up with PCP in 1-2 weeks; cardiology as scheduled 2. Please obtain BMP/CBC in one week  Home Health: Yes  Discharge Condition: Guarded CODE STATUS: DNR Diet recommendation: Cardiac diet with fluid restriction 1800cc/day, sodium restriction 1500 mg/day.  Brief/Interim Summary: 85 year old gentleman with history of non-Hodgkin's lymphoma status post radiation therapy, hypertension, COPD/pulmonary fibrosis on home oxygen, hyperlipidemia from assisted living facility presented to the emergency room with bilateral leg cramps and thigh pain.  Reported falls.  Poor appetite for last several days and living mostly on juice and fruit.  Patient also complained that he has not peed for 2 days.  Patient has chronic low back pain but he has been having more myalgia currently. In the emergency room he was clinically dehydrated, had tachycardia initially thought to be rapid A. fib, turned out to be rapid atrial tachycardia.  Admitted for clinical dehydration, acute renal failure, electrolyte abnormalities and tachycardia.  Patient admitted as above with weakness, poor p.o. intake, poor appetite over the past few days.  Noted to be moderately dehydrated with concern with overdiuresis given poor p.o. intake.  Patient's creatinine was moderately elevated over baseline CKD 3 but did respond quite adequately to IV fluids and holding home diuretics.  Patient also noted to have rapid heart rate on intake concerning for A. fib however further evaluation reveals this is multifocal atrial tachycardia, rate improved drastically with Cardizem. Cardiology following, appreciate insight recommendations, currently recommending frusemide 60 p.o.  twice daily, Spironolactone 12.5 daily, potassium 20 M EQ daily as well as strict dietary restrictions as above.  This time patient otherwise stable and agreeable for discharge back to assisted living facility with home health and physical therapy admitted per therapy team here.  Discharge Diagnoses:  Principal Problem:   Acute kidney injury superimposed on CKD [facility]) Active Problems:   Hyperlipidemia   Essential hypertension   COPD ([place])   Acute on chronic diastolic CHF (congestive heart failure) (HCC)   Leg pain   Pulmonary hypertension (HCC)   Multifocal atrial tachycardia (HCC)  Discharge Instructions    (HEART FAILURE PATIENTS) Call MD:  Anytime you have any of the following symptoms: 1) 3 pound weight gain in 24 hours or 5 pounds in 1 week 2) shortness of breath, with or without a dry hacking cough 3) swelling in the hands, feet or stomach 4) if you have to sleep on extra pillows at night in order to breathe.   Complete by: As directed    Call MD for:  difficulty breathing, headache or visual disturbances   Complete by: As directed    Call MD for:  extreme fatigue   Complete by: As directed    Call MD for:  persistant dizziness or light-headedness   Complete by: As directed    Diet - low sodium heart healthy   Complete by: As directed    Increase activity slowly   Complete by: As directed    Increase activity slowly   Complete by: As directed      Allergies as of 09/12/2018      Reactions   Pollen Extract-tree Extract Other (See Comments)   HEADACHES, TIRED , DRAINAGE FROM SINUSES   Molds & Smuts Other (See Comments)   Also dust mites causes  sinus infections, h/a etc.      Medication List    STOP taking these medications   potassium chloride SA 20 MEQ tablet Commonly known as: K-DUR     TAKE these medications   allopurinol 100 MG tablet Commonly known as: ZYLOPRIM Take 50 mg by mouth daily.   diltiazem 360 MG 24 hr capsule Commonly known as: CARDIZEM CD Take 1  capsule (360 mg total) by mouth daily.   finasteride 5 MG tablet Commonly known as: PROSCAR Take 1 tablet (5 mg total) by mouth daily. What changed: when to take this   FLUoxetine 10 MG capsule Commonly known as: PROZAC Take 10 mg by mouth daily.   omeprazole 20 MG capsule Commonly known as: PRILOSEC Take 20 mg by mouth daily.   Potassium Chloride ER 20 MEQ Tbcr Take 10 mEq by mouth daily.   pramipexole 0.75 MG tablet Commonly known as: MIRAPEX Take 0.75 mg by mouth at bedtime.   spironolactone 25 MG tablet Commonly known as: ALDACTONE Take 0.5 tablets (12.5 mg total) by mouth daily.   torsemide 20 MG tablet Commonly known as: DEMADEX Take 3 tablets (60 mg total) by mouth 2 (two) times daily. What changed:   how much to take  when to take this   traMADol 50 MG tablet Commonly known as: ULTRAM Take 50 mg by mouth as needed for moderate pain.   traZODone 100 MG tablet Commonly known as: DESYREL Take 100 mg by mouth at bedtime.   triazolam 0.25 MG tablet Commonly known as: HALCION Take 1 tablet by mouth at bedtime as needed for sleep.            Durable Medical Equipment  (From admission, onward)         Start     Ordered   09/12/18 1147  For home use only DME high strength lightweight manual wheelchair with seat cushion  (Wheelchairs)  Once    Comments: Patient suffers from ambulatory dysfunction, high fall risk and easily fatigues with minimal exertion which impairs their ability to perform daily activities like cooking, cleaning, general house/self care in the home.  A walker/cane will not resolve issue with performing activities of daily living. A wheelchair will allow patient to safely perform daily activities. Length of need 99 days. Accessories: elevating leg rests (ELRs), wheel locks, extensions and anti-tippers.   09/12/18 1153         Follow-up Information    [facility] Follow up on 09/20/2018.    Specialty: Cardiology Why: 1:30 Garage Code [id number]  Contact information: [address] [id number] [place] [phone]         Allergies  Allergen Reactions  . Pollen Extract-Tree Extract Other (See Comments)    HEADACHES, TIRED , DRAINAGE FROM SINUSES  . Molds & Smuts Other (See Comments)    Also dust mites causes sinus infections, h/a etc.    Consultations:  Cardiology  Procedures/Studies: Dg Chest 2 View  Result Date: 09/09/2018 CLINICAL DATA:  Shortness of breath. EXAM: CHEST - 2 VIEW COMPARISON:  September 07, 2018 FINDINGS: The left hemidiaphragm is elevated. The known large hiatal hernia is seen under the left hemidiaphragm. There is adjacent atelectasis. These findings are stable. The lungs, heart, hila, and mediastinum are otherwise unchanged. IMPRESSION: Elevated left hemidiaphragm with adjacent atelectasis. Large hiatal hernia. No other abnormalities. Electronically Signed   By: Dorise Bullion III M.D   On: 09/09/2018 16:28   Nm  Pulmonary Perfusion  Result Date: 09/09/2018 CLINICAL DATA:  Pulmonary fibrosis.  Shortness of breath. EXAM: NUCLEAR MEDICINE PERFUSION LUNG SCAN TECHNIQUE: Perfusion images were obtained in multiple projections after intravenous injection of radiopharmaceutical. Ventilation scans intentionally deferred if perfusion scan and chest x-ray adequate for interpretation during COVID 19 epidemic. RADIOPHARMACEUTICALS:  1.5 mCi Tc-39mMAA IV COMPARISON:  Chest x-ray September 09, 2018. CT of the abdomen and pelvis March 08, 2017. FINDINGS: A defect over the left base correlates with the large hiatal hernia seen on previous CT imaging. No suspicious defects identified. IMPRESSION: No evidence of pulmonary embolus. Electronically Signed   By: DDorise BullionIII M.D   On: 09/09/2018 16:27   Dg Chest Port 1 View  Result Date: 09/07/2018 CLINICAL DATA:  Shortness of breath EXAM: PORTABLE CHEST 1 VIEW COMPARISON:  July 19, 2018.  FINDINGS: Again noted is cardiomegaly with a hiatal hernia. There is streaky subsegmental atelectasis seen at the left lung base as on the prior exam. No new airspace consolidation. No acute osseous abnormality. IMPRESSION: No acute cardiopulmonary process. Hiatal hernia Mild cardiomegaly Electronically Signed   By: BPrudencio PairM.D.   On: 09/07/2018 13:30    Subjective: No acute issues or events overnight, continues to ambulate poorly but minimally improving. Otherwise denies chest pain, shortness of breath, headache, fever, chills.  Discharge Exam: Vitals:   09/12/18 0405 09/12/18 0615  BP: 113/73   Pulse: (!) 39 (!) 42  Resp: 20   Temp: (!) 97.5 F (36.4 C)   SpO2: 96% 93%   Vitals:   09/11/18 2038 09/12/18 0211 09/12/18 0405 09/12/18 0615  BP: 103/66  113/73   Pulse: 76  (!) 39 (!) 42  Resp: 15  20   Temp: (!) 97.5 F (36.4 C)  (!) 97.5 F (36.4 C)   TempSrc: Oral  Oral   SpO2: 97%  96% 93%  Weight:  93.7 kg    Height:        General:  Pleasantly resting in bed, No acute distress. HEENT:  Normocephalic atraumatic.  Sclerae nonicteric, noninjected.  Extraocular movements intact bilaterally. Neck:  Without mass or deformity.  Trachea is midline. Lungs:  Clear to auscultate bilaterally without rhonchi, wheeze, or rales. Heart:  Regular rate and rhythm.  Without murmurs, rubs, or gallops. Abdomen:  Soft, nontender, nondistended.  Without guarding or rebound. Extremities: Without cyanosis, clubbing, edema, or obvious deformity. Vascular:  Dorsalis pedis and posterior tibial pulses palpable bilaterally. Skin:  Warm and dry, no erythema, no ulcerations.   The results of significant diagnostics from this hospitalization (including imaging, microbiology, ancillary and laboratory) are listed below for reference.     Microbiology: Recent Results (from the past 240 hour(s))  [facility] Coron[facility] Nasopharyngeal Nasopharyngeal  Swab     Status: None   Collection Time: 09/07/18 12:42 PM   Specimen: Nasopharyngeal Swab  Result Value Ref Range Status   SARS Coronavirus 2 NEGATIVE NEGATIVE Final    Comment: (NOTE) If result is NEGATIVE SARS-CoV-2 target nucleic acids are NOT DETECTED. The SARS-CoV-2 RNA is generally detectable in upper and lower  respiratory specimens during the acute phase of infection. The lowest  concentration of SARS-CoV-2 viral copies this assay can detect is 250  copies / mL. A negative result does not preclude SARS-CoV-2 infection  and should not be used as the sole basis for treatment or other  patient management decisions.  A negative result may occur with  improper  specimen collection / handling, submission of specimen other  than nasopharyngeal swab, presence of viral mutation(s) within the  areas targeted by this assay, and inadequate number of viral copies  (<250 copies / mL). A negative result must be combined with clinical  observations, patient history, and epidemiological information. If result is POSITIVE SARS-CoV-2 target nucleic acids are DETECTED. The SARS-CoV-2 RNA is generally detectable in upper and lower  respiratory specimens dur ing the acute phase of infection.  Positive  results are indicative of active infection with SARS-CoV-2.  Clinical  correlation with patient history and other diagnostic information is  necessary to determine patient infection status.  Positive results do  not rule out bacterial infection or co-infection with other viruses. If result is PRESUMPTIVE POSTIVE SARS-CoV-2 nucleic acids MAY BE PRESENT.   A presumptive positive result was obtained on the submitted specimen  and confirmed on repeat testing.  While 2019 novel coronavirus  (SARS-CoV-2) nucleic acids may be present in the submitted sample  additional confirmatory testing may be necessary for epidemiological  and / or clinical management purposes  to differentiate between  SARS-CoV-2  and other Sarbecovirus currently known to infect humans.  If clinically indicated additional testing with an alternate test  methodology [phone]) is advised. The SARS-CoV-2 RNA is generally  detectable in upper and lower respiratory sp ecimens during the acute  phase of infection. The expected result is Negative. Fact Sheet for Patients:  StrictlyIdeas.no Fact Sheet for Healthcare Providers: BankingDealers.co.za This test is not yet approved or cleared by the [place] FDA and has been authorized for detection and/or diagnosis of SARS-CoV-2 by FDA under an Emergency Use Authorization (EUA).  This EUA will remain in effect (meaning this test can be used) for the duration of the COVID-19 declaration under Section 564(b)(1) of the Act, 21 U.S.C. section 360bbb-3(b)(1), unless the authorization is terminated or revoked sooner. Performed at [facility], [place] [address] [place], [place] [address]   MRSA PCR Screening     Status: None   Collection Time: 09/07/18 11:19 PM   Specimen: Nasal Mucosa; Nasopharyngeal  Result Value Ref Range Status   MRSA by PCR NEGATIVE NEGATIVE Final    Comment:        The GeneXpert MRSA Assay (FDA approved for NASAL specimens only), is one component of a comprehensive MRSA colonization surveillance program. It is not intended to diagnose MRSA infection nor to guide or monitor treatment for MRSA infections. Performed at [facility], [place] [address] [place], [place] [address]      Labs: BNP (last 3 results) Recent Labs    03/08/18 1859 07/19/18 1750 09/07/18 1241  BNP 40.3 36.1 69.4   Basic Metabolic Panel: Recent Labs  Lab 09/08/18 0831 09/09/18 0352 09/10/18 0356 09/11/18 0512 09/12/18 0332  NA 127* 131* 131* 131* 129*  K 3.3* 3.4* 3.7 3.8 3.6  CL 73* 81* 83* 82* 82*  CO2 38* 38* 36* 34* 35*  GLUCOSE 350* 232* 190* 185* 170*  BUN 93* 82* 52* 45* 48*  CREATININE 1.96*  1.69* 1.40* 1.55* 1.47*  CALCIUM 8.1* 8.3* 8.4* 8.3* 8.1*  MG 2.5*  --   --   --   --    Liver Function Tests: Recent Labs  Lab 09/07/18 1241  AST 28  ALT 27  ALKPHOS 125  BILITOT 0.4  PROT 6.8  ALBUMIN 3.5   No results for input(s): LIPASE, AMYLASE in the last 168 hours. No results for input(s): AMMONIA in  the last 168 hours. CBC: Recent Labs  Lab 09/08/18 0831 09/09/18 0352 09/10/18 0356 09/11/18 0512 09/12/18 0332  WBC 14.5* 10.9* 9.7 9.6 8.9  NEUTROABS 12.0* 8.6* 7.8* 6.8 7.0  HGB 16.0 15.3 15.1 15.1 14.2  HCT 48.8 48.1 48.9 48.7 44.7  MCV 88.4 91.3 92.6 93.1 90.3  PLT 254 226 206 204 199   Cardiac Enzymes: No results for input(s): CKTOTAL, CKMB, CKMBINDEX, TROPONINI in the last 168 hours. BNP: Invalid input(s): POCBNP CBG: Recent Labs  Lab 09/11/18 1423 09/11/18 1606 09/11/18 2120 09/12/18 0813 09/12/18 1055  GLUCAP 184* 175* 240* 282* 326*   Urinalysis    Component Value Date/Time   COLORURINE STRAW (A) 09/07/2018 1241   APPEARANCEUR CLEAR 09/07/2018 1241   LABSPEC 1.009 09/07/2018 1241   LABSPEC 1.025 06/28/2013 1321   PHURINE 6.0 09/07/2018 1241   GLUCOSEU 50 (A) 09/07/2018 1241   GLUCOSEU Negative 06/28/2013 1321   HGBUR NEGATIVE 09/07/2018 1241   [place] 09/07/2018 1241   BILIRUBINUR Negative 06/28/2013 1321   KETONESUR NEGATIVE 09/07/2018 1241   PROTEINUR NEGATIVE 09/07/2018 1241   UROBILINOGEN 1.0 01/04/2014 2156   UROBILINOGEN 0.2 06/28/2013 1321   NITRITE NEGATIVE 09/07/2018 1241   LEUKOCYTESUR NEGATIVE 09/07/2018 1241   LEUKOCYTESUR Negative 06/28/2013 1321   Microbiology Recent Results (from the past 240 hour(s))  SARS Coronavirus 2 [facility] order, Performed in [facility] hospital lab) Nasopharyngeal Nasopharyngeal Swab     Status: None   Collection Time: 09/07/18 12:42 PM   Specimen: Nasopharyngeal Swab  Result Value Ref Range Status   SARS Coronavirus 2 NEGATIVE NEGATIVE Final    Comment: (NOTE) If result is  NEGATIVE SARS-CoV-2 target nucleic acids are NOT DETECTED. The SARS-CoV-2 RNA is generally detectable in upper and lower  respiratory specimens during the acute phase of infection. The lowest  concentration of SARS-CoV-2 viral copies this assay can detect is 250  copies / mL. A negative result does not preclude SARS-CoV-2 infection  and should not be used as the sole basis for treatment or other  patient management decisions.  A negative result may occur with  improper specimen collection / handling, submission of specimen other  than nasopharyngeal swab, presence of viral mutation(s) within the  areas targeted by this assay, and inadequate number of viral copies  (<250 copies / mL). A negative result must be combined with clinical  observations, patient history, and epidemiological information. If result is POSITIVE SARS-CoV-2 target nucleic acids are DETECTED. The SARS-CoV-2 RNA is generally detectable in upper and lower  respiratory specimens dur ing the acute phase of infection.  Positive  results are indicative of active infection with SARS-CoV-2.  Clinical  correlation with patient history and other diagnostic information is  necessary to determine patient infection status.  Positive results do  not rule out bacterial infection or co-infection with other viruses. If result is PRESUMPTIVE POSTIVE SARS-CoV-2 nucleic acids MAY BE PRESENT.   A presumptive positive result was obtained on the submitted specimen  and confirmed on repeat testing.  While 2019 novel coronavirus  (SARS-CoV-2) nucleic acids may be present in the submitted sample  additional confirmatory testing may be necessary for epidemiological  and / or clinical management purposes  to differentiate between  SARS-CoV-2 and other Sarbecovirus currently known to infect humans.  If clinically indicated additional testing with an alternate test  methodology [phone]) is advised. The SARS-CoV-2 RNA is generally  detectable  in upper and lower respiratory sp ecimens during the acute  phase of infection.  The expected result is Negative. Fact Sheet for Patients:  StrictlyIdeas.no Fact Sheet for Healthcare Providers: BankingDealers.co.za This test is not yet approved or cleared by the [place] FDA and has been authorized for detection and/or diagnosis of SARS-CoV-2 by FDA under an Emergency Use Authorization (EUA).  This EUA will remain in effect (meaning this test can be used) for the duration of the COVID-19 declaration under Section 564(b)(1) of the Act, 21 U.S.C. section 360bbb-3(b)(1), unless the authorization is terminated or revoked sooner. Performed at [facility], [place] [address] [place], [place] [address]   MRSA PCR Screening     Status: None   Collection Time: 09/07/18 11:19 PM   Specimen: Nasal Mucosa; Nasopharyngeal  Result Value Ref Range Status   MRSA by PCR NEGATIVE NEGATIVE Final    Comment:        The GeneXpert MRSA Assay (FDA approved for NASAL specimens only), is one component of a comprehensive MRSA colonization surveillance program. It is not intended to diagnose MRSA infection nor to guide or monitor treatment for MRSA infections. Performed at [facility], [place] [address] [place], [place] [address]    Time coordinating discharge: Over 30 minutes  SIGNED:  Little Ishikawa, DO [facility] 09/12/2018, 11:53 AM

## 2018-09-12 NOTE — TOC Progression Note (Signed)
Transition of Care [facility]) - Progression Note    Patient Details  Name: Ryan [name] MRN: [id number] Date of Birth: April 27, 1931  Transition of Care [facility]) CM/SW Contact  Graves-Bigelow, Ocie Cornfield, RN Phone Number: 09/12/2018, 2:04 PM  Clinical Narrative:   CM reached out to [facility]- oxygen will be dispatched from the [facility]. CM spoke to Liaison and 02 will be delivered- no estimated time of arrival at this time. NO further needs from CM at this time.   Expected Discharge Plan: [place]- with [place]) Barriers to Discharge: No Barriers Identified  Expected Discharge Plan and Services Expected Discharge Plan: [place]- with [place]) In-house Referral: NA Discharge Planning Services: CM Consult Post Acute Care Choice: Home Health(will use living well program at [facility]) Living arrangements for the past 2 months: [place] Expected Discharge Date: 09/12/18                HH Arranged: PT, OT [place] Agency: Other - See comment([facility]) Date HH Agency Contacted: 09/12/18 Time [place]: 42 Representative spoke with at [place]: [place] ([place]) Interventions    Readmission Risk Interventions Readmission Risk Prevention Plan 09/12/2018 07/26/2018  Transportation Screening Complete Complete  PCP or Specialist Appt within 3-5 Days Complete Complete  HRI or [place] Complete Complete  Social Work Consult for [place] Planning/Counseling Complete Complete  Palliative Care Screening Not Applicable Not Applicable  Medication Review [employer]) Complete Complete  Some recent data might be hidden

## 2018-09-12 NOTE — Progress Notes (Signed)
Physical Therapy Treatment Patient Details Name: Ryan [name] MRN: [id number] DOB: 03-13-1931 Today's Date: 09/12/2018    History of Present Illness 85 y.o. male with medical history significant of NHL  S/p radiation therapy; HTN; COPD/pulmonary fibrosis on home O2; and HLD presenting with SOB. Reports bilateral ankle pain, and weakness which resulted in fall. Admitted 09/07/18 for treatment  rapid atrial tachycardia.      PT Comments    Pt cognition is better today, but still exhibit impulsivity with movement. Pt is min A for transfers and ambulation, however on standing pt experiences urinary urgency which he admits can cause him to move too quickly. Pt limited in safe mobility by poor safety awareness in presence of decreased strength, balance and impaired eyesight and HOH. Plan is for discharge back to [facility], which reportedly has levels of assistance provided. Given pt poor safety awareness and impulsivity with urinary urgency, PT recommends highest level of assist available with at least hourly checks. Pt plan for d/c today.     Follow Up Recommendations  SNF     Equipment Recommendations  Wheelchair (measurements PT);Wheelchair cushion (measurements PT)    Recommendations for Other Services       Precautions / Restrictions Precautions Precautions: Fall Precaution Comments: reports 2 falls in 2 days just prior to admission Restrictions Weight Bearing Restrictions: No    Mobility  Bed Mobility Overal bed mobility: Needs Assistance Bed Mobility: Supine to Sit     Supine to sit: Min assist     General bed mobility comments: OOB in recliner on entry  Transfers Overall transfer level: Needs assistance Equipment used: Rolling walker (2 wheeled) Transfers: Sit to/from [facility] Sit to Stand: Min assist Stand pivot transfers: Min assist       General transfer comment: Pt able to power up into standing but requires cuing for proper hand  placement for powerup and min A for steadying with RW,   Ambulation/Gait Ambulation/Gait assistance: Min assist Gait Distance (Feet): 35 Feet Assistive device: Rolling walker (2 wheeled) Gait Pattern/deviations: Step-through pattern;Decreased step length - right;Decreased step length - left;Shuffle;Decreased stride length Gait velocity: slowed Gait velocity interpretation: <1.8 ft/sec, indicate of risk for recurrent falls General Gait Details: minA for steadying continues to have very small shuffling steps with decreased foot clearance      Balance Overall balance assessment: Needs assistance Sitting-balance support: Feet supported;No upper extremity supported Sitting balance-Leahy Scale: Fair     Standing balance support: Bilateral upper extremity supported Standing balance-Leahy Scale: Poor Standing balance comment: requires UE support to maintain balance; states he feels unsteady                            Cognition Arousal/Alertness: Awake/alert Behavior During Therapy: WFL for tasks assessed/performed Overall Cognitive Status: Impaired/Different from baseline Area of Impairment: Following commands;Safety/judgement;Awareness                       Following Commands: Follows one step commands with increased time;Follows multi-step commands with increased time Safety/Judgement: Decreased awareness of safety Awareness: Emergent   General Comments: pt continues to have short term memory/safety awareness deficits, increased cuing needed for problem solving RW around obstacles in room          General Comments General comments (skin integrity, edema, etc.): Pt on 2L O2 via [place] SaO2 >90%O2 throughout session, pt becomes very SoB with activity encouraged slow deep breathing.  Pertinent Vitals/Pain Pain Assessment: Faces Faces Pain Scale: Hurts little more Pain Location: L back under armpit Pain Descriptors / Indicators: Cramping Pain  Intervention(s): Limited activity within patient's tolerance;Monitored during session;Repositioned;Heat applied           PT Goals (current goals can now be found in the care plan section) Acute Rehab PT Goals Patient Stated Goal: have less pain and be able to go home PT Goal Formulation: With patient Time For Goal Achievement: 09/22/18 Potential to Achieve Goals: Fair Progress towards PT goals: Progressing toward goals    Frequency    Min 2X/week      PT Plan Current plan remains appropriate       AM-PAC PT "6 Clicks" Mobility   Outcome Measure  Help needed turning from your back to your side while in a flat bed without using bedrails?: A Little Help needed moving from lying on your back to sitting on the side of a flat bed without using bedrails?: A Little Help needed moving to and from a bed to a chair (including a wheelchair)?: A Little Help needed standing up from a chair using your arms (e.g., wheelchair or bedside chair)?: A Little Help needed to walk in hospital room?: A Little Help needed climbing 3-5 steps with a railing? : A Lot 6 Click Score: 17    End of Session Equipment Utilized During Treatment: Gait belt;Oxygen Activity Tolerance: Patient tolerated treatment well Patient left: in chair;with call bell/phone within reach;with chair alarm set Nurse Communication: Mobility status PT Visit Diagnosis: Unsteadiness on feet (R26.81);Other abnormalities of gait and mobility (R26.89);Muscle weakness (generalized) (M62.81);History of falling (Z91.81);Difficulty in walking, not elsewhere classified (R26.2);Pain Pain - Right/Left: Right Pain - part of body: (side)     Time: 6751-9824 PT Time Calculation (min) (ACUTE ONLY): 27 min  Charges:  $Gait Training: 23-37 mins                     Monetta Lick B. Migdalia Dk PT, DPT Acute Rehabilitation Services Pager [phone] Office [phone]    [place] 09/12/2018, 2:36 PM

## 2018-09-12 NOTE — Progress Notes (Signed)
Inpatient Diabetes Program Recommendations  AACE/ADA: New Consensus Statement on Inpatient Glycemic Control (2015)  Target Ranges:  Prepandial:   less than 140 mg/dL      Peak postprandial:   less than 180 mg/dL (1-2 hours)      Critically ill patients:  140 - 180 mg/dL   Lab Results  Component Value Date   GLUCAP 326 (H) 09/12/2018   HGBA1C 8.6 (H) 09/07/2018    Review of Glycemic Control  RN asked Diabetes Coordinator to speak with pt about his diagnosis of diabetes and requested to call pt's wife about obtaining glucose meter to check blood sugars. Discussed eliminating juices from diet and importance of f/u with PCP for diabetes management.   Will call wife regarding glucose meter. Will instruct to check blood sugars 1-2x/day at various times.   Need diagnosis of diabetes - Type 2, for OP Diabetes Education consult.  Page MD for above.   Thank you. Lorenda Peck, RD, LDN, CDE Inpatient Diabetes Coordinator [phone]

## 2018-09-20 ENCOUNTER — Inpatient Hospital Stay (HOSPITAL_COMMUNITY)
Admission: EM | Admit: 2018-09-20 | Discharge: 2018-09-26 | DRG: 291 | Disposition: A | Discharge status: Home Health | Payer: Medicare HMO | Attending: Internal Medicine | Admitting: Internal Medicine

## 2018-09-20 ENCOUNTER — Emergency Department (HOSPITAL_COMMUNITY): Payer: Medicare HMO

## 2018-09-20 ENCOUNTER — Encounter (HOSPITAL_COMMUNITY): Payer: Medicare HMO

## 2018-09-20 ENCOUNTER — Other Ambulatory Visit: Payer: Self-pay

## 2018-09-20 DIAGNOSIS — R0689 Other abnormalities of breathing: Secondary | ICD-10-CM

## 2018-09-20 DIAGNOSIS — G8929 Other chronic pain: Secondary | ICD-10-CM | POA: Diagnosis present

## 2018-09-20 DIAGNOSIS — J9601 Acute respiratory failure with hypoxia: Secondary | ICD-10-CM | POA: Diagnosis present

## 2018-09-20 DIAGNOSIS — I130 Hypertensive heart and chronic kidney disease with heart failure and stage 1 through stage 4 chronic kidney disease, or unspecified chronic kidney disease: Secondary | ICD-10-CM | POA: Diagnosis not present

## 2018-09-20 DIAGNOSIS — I5031 Acute diastolic (congestive) heart failure: Secondary | ICD-10-CM | POA: Diagnosis not present

## 2018-09-20 DIAGNOSIS — I471 Supraventricular tachycardia: Secondary | ICD-10-CM | POA: Diagnosis present

## 2018-09-20 DIAGNOSIS — G9341 Metabolic encephalopathy: Secondary | ICD-10-CM | POA: Diagnosis present

## 2018-09-20 DIAGNOSIS — N183 Chronic kidney disease, stage 3 unspecified: Secondary | ICD-10-CM | POA: Diagnosis present

## 2018-09-20 DIAGNOSIS — J9602 Acute respiratory failure with hypercapnia: Secondary | ICD-10-CM | POA: Diagnosis not present

## 2018-09-20 DIAGNOSIS — M419 Scoliosis, unspecified: Secondary | ICD-10-CM | POA: Diagnosis present

## 2018-09-20 DIAGNOSIS — J449 Chronic obstructive pulmonary disease, unspecified: Secondary | ICD-10-CM | POA: Diagnosis present

## 2018-09-20 DIAGNOSIS — Z8042 Family history of malignant neoplasm of prostate: Secondary | ICD-10-CM

## 2018-09-20 DIAGNOSIS — I493 Ventricular premature depolarization: Secondary | ICD-10-CM | POA: Diagnosis present

## 2018-09-20 DIAGNOSIS — E876 Hypokalemia: Secondary | ICD-10-CM | POA: Diagnosis not present

## 2018-09-20 DIAGNOSIS — Z66 Do not resuscitate: Secondary | ICD-10-CM | POA: Diagnosis present

## 2018-09-20 DIAGNOSIS — Z87891 Personal history of nicotine dependence: Secondary | ICD-10-CM

## 2018-09-20 DIAGNOSIS — E785 Hyperlipidemia, unspecified: Secondary | ICD-10-CM | POA: Diagnosis present

## 2018-09-20 DIAGNOSIS — Z9841 Cataract extraction status, right eye: Secondary | ICD-10-CM

## 2018-09-20 DIAGNOSIS — I5033 Acute on chronic diastolic (congestive) heart failure: Secondary | ICD-10-CM | POA: Diagnosis present

## 2018-09-20 DIAGNOSIS — M5416 Radiculopathy, lumbar region: Secondary | ICD-10-CM | POA: Diagnosis present

## 2018-09-20 DIAGNOSIS — K219 Gastro-esophageal reflux disease without esophagitis: Secondary | ICD-10-CM | POA: Diagnosis present

## 2018-09-20 DIAGNOSIS — I5032 Chronic diastolic (congestive) heart failure: Secondary | ICD-10-CM | POA: Diagnosis not present

## 2018-09-20 DIAGNOSIS — Z923 Personal history of irradiation: Secondary | ICD-10-CM

## 2018-09-20 DIAGNOSIS — Z20828 Contact with and (suspected) exposure to other viral communicable diseases: Secondary | ICD-10-CM | POA: Diagnosis present

## 2018-09-20 DIAGNOSIS — I272 Pulmonary hypertension, unspecified: Secondary | ICD-10-CM

## 2018-09-20 DIAGNOSIS — K9041 Non-celiac gluten sensitivity: Secondary | ICD-10-CM | POA: Diagnosis present

## 2018-09-20 DIAGNOSIS — J8410 Pulmonary fibrosis, unspecified: Secondary | ICD-10-CM | POA: Diagnosis present

## 2018-09-20 DIAGNOSIS — M19072 Primary osteoarthritis, left ankle and foot: Secondary | ICD-10-CM | POA: Diagnosis present

## 2018-09-20 DIAGNOSIS — R5383 Other fatigue: Secondary | ICD-10-CM | POA: Diagnosis present

## 2018-09-20 DIAGNOSIS — N400 Enlarged prostate without lower urinary tract symptoms: Secondary | ICD-10-CM | POA: Diagnosis present

## 2018-09-20 DIAGNOSIS — J9622 Acute and chronic respiratory failure with hypercapnia: Secondary | ICD-10-CM | POA: Diagnosis present

## 2018-09-20 DIAGNOSIS — N179 Acute kidney failure, unspecified: Secondary | ICD-10-CM | POA: Diagnosis present

## 2018-09-20 DIAGNOSIS — E1122 Type 2 diabetes mellitus with diabetic chronic kidney disease: Secondary | ICD-10-CM | POA: Diagnosis present

## 2018-09-20 DIAGNOSIS — M109 Gout, unspecified: Secondary | ICD-10-CM | POA: Diagnosis present

## 2018-09-20 DIAGNOSIS — Z79899 Other long term (current) drug therapy: Secondary | ICD-10-CM

## 2018-09-20 DIAGNOSIS — K5730 Diverticulosis of large intestine without perforation or abscess without bleeding: Secondary | ICD-10-CM | POA: Diagnosis present

## 2018-09-20 DIAGNOSIS — R400 Somnolence: Secondary | ICD-10-CM

## 2018-09-20 DIAGNOSIS — F329 Major depressive disorder, single episode, unspecified: Secondary | ICD-10-CM | POA: Diagnosis present

## 2018-09-20 DIAGNOSIS — M545 Low back pain, unspecified: Secondary | ICD-10-CM

## 2018-09-20 DIAGNOSIS — M19071 Primary osteoarthritis, right ankle and foot: Secondary | ICD-10-CM | POA: Diagnosis present

## 2018-09-20 DIAGNOSIS — I509 Heart failure, unspecified: Secondary | ICD-10-CM

## 2018-09-20 DIAGNOSIS — G2581 Restless legs syndrome: Secondary | ICD-10-CM | POA: Diagnosis not present

## 2018-09-20 DIAGNOSIS — Z9981 Dependence on supplemental oxygen: Secondary | ICD-10-CM

## 2018-09-20 DIAGNOSIS — J9621 Acute and chronic respiratory failure with hypoxia: Secondary | ICD-10-CM | POA: Diagnosis present

## 2018-09-20 DIAGNOSIS — J4540 Moderate persistent asthma, uncomplicated: Secondary | ICD-10-CM | POA: Diagnosis present

## 2018-09-20 DIAGNOSIS — Z8572 Personal history of non-Hodgkin lymphomas: Secondary | ICD-10-CM

## 2018-09-20 DIAGNOSIS — Z9842 Cataract extraction status, left eye: Secondary | ICD-10-CM

## 2018-09-20 DIAGNOSIS — M19042 Primary osteoarthritis, left hand: Secondary | ICD-10-CM | POA: Diagnosis present

## 2018-09-20 DIAGNOSIS — I2723 Pulmonary hypertension due to lung diseases and hypoxia: Secondary | ICD-10-CM | POA: Diagnosis present

## 2018-09-20 DIAGNOSIS — Z8249 Family history of ischemic heart disease and other diseases of the circulatory system: Secondary | ICD-10-CM

## 2018-09-20 DIAGNOSIS — M170 Bilateral primary osteoarthritis of knee: Secondary | ICD-10-CM | POA: Diagnosis present

## 2018-09-20 DIAGNOSIS — I4891 Unspecified atrial fibrillation: Secondary | ICD-10-CM | POA: Diagnosis present

## 2018-09-20 DIAGNOSIS — M479 Spondylosis, unspecified: Secondary | ICD-10-CM | POA: Diagnosis present

## 2018-09-20 DIAGNOSIS — M19041 Primary osteoarthritis, right hand: Secondary | ICD-10-CM | POA: Diagnosis present

## 2018-09-20 DIAGNOSIS — Z85820 Personal history of malignant melanoma of skin: Secondary | ICD-10-CM

## 2018-09-20 DIAGNOSIS — K2270 Barrett's esophagus without dysplasia: Secondary | ICD-10-CM | POA: Diagnosis present

## 2018-09-20 LAB — CBC
HCT: 44.9 % (ref 39.0–52.0)
Hemoglobin: 13.6 g/dL (ref 13.0–17.0)
MCH: 28.9 pg (ref 26.0–34.0)
MCHC: 30.3 g/dL (ref 30.0–36.0)
MCV: 95.3 fL (ref 80.0–100.0)
Platelets: 202 10*3/uL (ref 150–400)
RBC: 4.71 MIL/uL (ref 4.22–5.81)
RDW: 15.8 % — ABNORMAL HIGH (ref 11.5–15.5)
WBC: 6.5 10*3/uL (ref 4.0–10.5)
nRBC: 0 % (ref 0.0–0.2)

## 2018-09-20 LAB — POCT I-STAT EG7
Acid-Base Excess: 9 mmol/L — ABNORMAL HIGH (ref 0.0–2.0)
Bicarbonate: 39.6 mmol/L — ABNORMAL HIGH (ref 20.0–28.0)
Calcium, Ion: 1.1 mmol/L — ABNORMAL LOW (ref 1.15–1.40)
HCT: 44 % (ref 39.0–52.0)
Hemoglobin: 15 g/dL (ref 13.0–17.0)
O2 Saturation: 99 %
Potassium: 4.5 mmol/L (ref 3.5–5.1)
Sodium: 135 mmol/L (ref 135–145)
TCO2: 42 mmol/L — ABNORMAL HIGH (ref 22–32)
pCO2, Ven: 81.2 mmHg (ref 44.0–60.0)
pH, Ven: 7.296 (ref 7.250–7.430)
pO2, Ven: 172 mmHg — ABNORMAL HIGH (ref 32.0–45.0)

## 2018-09-20 LAB — SARS CORONAVIRUS 2 BY RT PCR (HOSPITAL ORDER, PERFORMED IN ~~LOC~~ HOSPITAL LAB): SARS Coronavirus 2: NEGATIVE

## 2018-09-20 LAB — BRAIN NATRIURETIC PEPTIDE: B Natriuretic Peptide: 86.2 pg/mL (ref 0.0–100.0)

## 2018-09-20 LAB — BLOOD GAS, ARTERIAL
Acid-Base Excess: 9.8 mmol/L — ABNORMAL HIGH (ref 0.0–2.0)
Bicarbonate: 34.7 mmol/L — ABNORMAL HIGH (ref 20.0–28.0)
Delivery systems: POSITIVE
Drawn by: 56037
Expiratory PAP: 6
FIO2: 40
Inspiratory PAP: 12
Mode: POSITIVE
O2 Saturation: 98.1 %
Patient temperature: 98.6
RATE: 8 resp/min
pCO2 arterial: 55.6 mmHg — ABNORMAL HIGH (ref 32.0–48.0)
pH, Arterial: 7.412 (ref 7.350–7.450)
pO2, Arterial: 101 mmHg (ref 83.0–108.0)

## 2018-09-20 LAB — BASIC METABOLIC PANEL
Anion gap: 13 (ref 5–15)
BUN: 36 mg/dL — ABNORMAL HIGH (ref 8–23)
CO2: 30 mmol/L (ref 22–32)
Calcium: 8.4 mg/dL — ABNORMAL LOW (ref 8.9–10.3)
Chloride: 92 mmol/L — ABNORMAL LOW (ref 98–111)
Creatinine, Ser: 1.65 mg/dL — ABNORMAL HIGH (ref 0.61–1.24)
GFR calc Af Amer: 44 mL/min — ABNORMAL LOW (ref >60)
GFR calc non Af Amer: 38 mL/min — ABNORMAL LOW (ref >60)
Glucose, Bld: 229 mg/dL — ABNORMAL HIGH (ref 70–99)
Potassium: 4.5 mmol/L (ref 3.5–5.1)
Sodium: 135 mmol/L (ref 135–145)

## 2018-09-20 MED ORDER — ONDANSETRON HCL 4 MG PO TABS: 4.0000 mg | ORAL_TABLET | Freq: Four times a day (QID) | ORAL | Status: DC | PRN

## 2018-09-20 MED ORDER — ONDANSETRON HCL 4 MG/2ML IJ SOLN: 4.0000 mg | Freq: Four times a day (QID) | INTRAMUSCULAR | Status: DC | PRN

## 2018-09-20 MED ORDER — ACETAMINOPHEN 650 MG RE SUPP: 650.0000 mg | Freq: Four times a day (QID) | RECTAL | Status: DC | PRN

## 2018-09-20 MED ORDER — FUROSEMIDE 10 MG/ML IJ SOLN
60.0000 mg | Freq: Once | INTRAMUSCULAR | Status: AC
  Administered 2018-09-20: 60 mg via INTRAVENOUS
  Filled 2018-09-20: qty 6

## 2018-09-20 MED ORDER — ACETAMINOPHEN 325 MG PO TABS
650.0000 mg | ORAL_TABLET | Freq: Four times a day (QID) | ORAL | Status: DC | PRN
  Administered 2018-09-21 – 2018-09-25 (×7): 650 mg via ORAL
  Filled 2018-09-20 (×8): qty 2

## 2018-09-20 MED ORDER — INSULIN ASPART 100 UNIT/ML ~~LOC~~ SOLN
0.0000 [IU] | Freq: Three times a day (TID) | SUBCUTANEOUS | Status: DC
  Administered 2018-09-21: 1 [IU] via SUBCUTANEOUS
  Administered 2018-09-21 – 2018-09-22 (×2): 2 [IU] via SUBCUTANEOUS
  Administered 2018-09-22 – 2018-09-23 (×2): 1 [IU] via SUBCUTANEOUS
  Administered 2018-09-23: 2 [IU] via SUBCUTANEOUS
  Administered 2018-09-23: 1 [IU] via SUBCUTANEOUS
  Administered 2018-09-24 – 2018-09-25 (×3): 2 [IU] via SUBCUTANEOUS

## 2018-09-20 MED ORDER — POLYETHYLENE GLYCOL 3350 17 G PO PACK: 17.0000 g | PACK | Freq: Every day | ORAL | Status: DC | PRN

## 2018-09-20 MED ORDER — SPIRONOLACTONE 12.5 MG HALF TABLET
12.5000 mg | ORAL_TABLET | Freq: Every day | ORAL | Status: DC
  Administered 2018-09-21 – 2018-09-23 (×3): 12.5 mg via ORAL
  Filled 2018-09-20 (×3): qty 1

## 2018-09-20 MED ORDER — PANTOPRAZOLE SODIUM 40 MG PO TBEC
40.0000 mg | DELAYED_RELEASE_TABLET | Freq: Every day | ORAL | Status: DC
  Administered 2018-09-21 – 2018-09-26 (×6): 40 mg via ORAL
  Filled 2018-09-20 (×6): qty 1

## 2018-09-20 MED ORDER — FINASTERIDE 5 MG PO TABS
5.0000 mg | ORAL_TABLET | Freq: Every day | ORAL | Status: DC
  Administered 2018-09-21 – 2018-09-25 (×5): 5 mg via ORAL
  Filled 2018-09-20 (×6): qty 1

## 2018-09-20 MED ORDER — HEPARIN SODIUM (PORCINE) 5000 UNIT/ML IJ SOLN
5000.0000 [IU] | Freq: Three times a day (TID) | INTRAMUSCULAR | Status: DC
  Administered 2018-09-20 – 2018-09-26 (×17): 5000 [IU] via SUBCUTANEOUS
  Filled 2018-09-20 (×17): qty 1

## 2018-09-20 MED ORDER — ALLOPURINOL 100 MG PO TABS
50.0000 mg | ORAL_TABLET | Freq: Every day | ORAL | Status: DC
  Administered 2018-09-21 – 2018-09-26 (×6): 50 mg via ORAL
  Filled 2018-09-20 (×6): qty 1

## 2018-09-20 MED ORDER — PRAMIPEXOLE DIHYDROCHLORIDE 0.25 MG PO TABS
0.7500 mg | ORAL_TABLET | Freq: Every day | ORAL | Status: DC
  Administered 2018-09-21 – 2018-09-25 (×5): 0.75 mg via ORAL
  Filled 2018-09-20 (×7): qty 3

## 2018-09-20 MED ORDER — FLUOXETINE HCL 10 MG PO CAPS
10.0000 mg | ORAL_CAPSULE | Freq: Every day | ORAL | Status: DC
  Administered 2018-09-21 – 2018-09-26 (×6): 10 mg via ORAL
  Filled 2018-09-20 (×6): qty 1

## 2018-09-20 NOTE — ED Triage Notes (Signed)
Pt reports he took a tramadol for his chronic back pain this morning.

## 2018-09-20 NOTE — Progress Notes (Signed)
Transported patient from ED to 5W04 without incident.

## 2018-09-20 NOTE — ED Notes (Signed)
This RN spoke with wife [name] and updated her. Per patient's wife she is looking into possibly working with hospice for the patient. MD made aware of this by this RN.

## 2018-09-20 NOTE — ED Provider Notes (Addendum)
[facility] EMERGENCY DEPARTMENT Provider Note   CSN: [id number] Arrival date & time: 09/20/18  1230    History   Chief Complaint Chief Complaint  Patient presents with  . Shortness of Breath    HPI BONIFACE GOFFE is a 85 y.o. male who presents from [place] [facility] who states that nursing staff sent him to the emergency room because he is weak. Patient is not sure when his weakness started. Patient has had increased fluid in his legs which he says has worsened over the past few days as he has not been wearing compression socks that he usually does. Patient says that he takes his torsemide twice per day and that the nursing staff at the facility divide his pills for him and he takes them himself. Patient denies shortness of breath, chest pain, fever, chills, cough, congestion, abdominal pain, nausea, vomiting, dysuria.  Called nursing facility to obtain information, unable to reach staff, left message on machine with callback number.     HPI  Past Medical History:  Diagnosis Date  . Asthma   . Barrett's esophagus   . Benign prostatic hypertrophy   . Bilateral lower extremity edema   . CHF (congestive heart failure) ([place])   . Chronic fatigue   . COPD (chronic obstructive pulmonary disease) ([place])   . Diverticulosis of colon (without mention of hemorrhage)   . Esophageal reflux   . Gastritis   . Gluten intolerance   . Hiatal hernia 02/10/11   Noted on CT Scan - Moderate Hiatal Hernia  . Hyperlipemia   . Hypertension   . Lymphoma of lymph nodes in pelvis ([name]) 03/03/2011    Large Right Retroperitoneal Mass  . Melanoma ([place])    lymphoma  . Microcytic anemia 04/20/2011   . 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 Osteoarthritis    hands/feet,knees, NECK, BACK  . Periaortic lymphadenopathy 02/16/2011  . Personal history of colonic polyps 2004   hyperplastic Dr. Collene Mares  . Renal cyst 02/10/11   Noted on CT Scan - Bilateral Renal Cysts  . S/P radiation therapy 03/15/11 - 04/09/11   Abdominal/ Pelvic Tumor, 3600 cGy/20 Fractions    Patient Active Problem List   Diagnosis Date Noted  . Acute kidney injury superimposed on CKD ([place]) 09/10/2018  . Pulmonary hypertension ([place]) 09/10/2018  . Multifocal atrial tachycardia ([place]) 09/10/2018  . CHF exacerbation ([place]) 09/07/2018  . Leg pain 09/07/2018  . Hyperkalemia 07/26/2018  . Acute on chronic diastolic CHF (congestive heart failure) ([place])   . Bilateral lower extremity edema 02/02/2017  . Pulmonary fibrosis ([place]) 11/22/2016  . Blurry vision, bilateral 10/26/2016  . COPD ([place]) 07/05/2016  . Exertional dyspnea 09/10/2015  . Fatigue/malaise 08/19/2015  . Asthma/COPD 02/12/2015  . 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 Hypoxemia 01/05/2014  . Essential hypertension 01/05/2014  . Hypokalemia 01/05/2014  . Urinary retention 01/05/2014  . Hypoxia   . Lumbar radiculopathy 12/31/2013  . RLS (restless legs syndrome) 09/21/2012  . Chronic lower back pain 06/15/2012  . Abdominal pain, unspecified site 05/19/2012  . Unspecified deficiency anemia 04/07/2012  . Melanoma ([place])   . Non Hodgkin's lymphoma ([place])   . Periaortic lymphadenopathy 02/16/2011  . Barrett's esophagus 07/09/2010  . Personal history of colonic polyps 05/28/2010  . History of IBS 05/28/2010  . Diverticulosis of colon (without mention of hemorrhage) 05/28/2010  . Arthritis involving multiple sites 05/28/2010  . Wheat intolerance 05/28/2010  .  Chronic venous insufficiency 08/19/2009  . PEDAL EDEMA 08/19/2009  . INSOMNIA, CHRONIC 12/24/2008  . EUSTACHIAN TUBE DYSFUNCTION, BILATERAL 12/23/2008  . ERECTILE DYSFUNCTION, ORGANIC 08/01/2008  . Allergic rhinitis with a nonallergic component 05/20/2008  . VENTRAL HERNIA 02/12/2008  . PALPITATIONS 12/25/2007  . LACTOSE INTOLERANCE 10/17/2007  . Hyperlipidemia 09/14/2006  . BPH (benign prostatic hyperplasia)  09/14/2006  . OSTEOARTHRITIS 09/14/2006  . HEART MURMUR, HX OF 09/14/2006    Past Surgical History:  Procedure Laterality Date  . ARTHROSCOPIC REPAIR ACL     right  . BONE MARROW ASPIRATION  02/25/11   Bone Marrow, Aspirate, Clot, and Bilateral Bx, Right PIC  . CATARACT EXTRACTION, BILATERAL    . EYE SURGERY    . HERNIA REPAIR     LEFT INGUINAL   . INSERTION OF MESH N/A 08/23/2012   Procedure: INSERTION OF MESH;  Surgeon: Madilyn Hook, DO;  Location: [facility];  Service: General;  Laterality: N/A;  . LUMBAR LAMINECTOMY/DECOMPRESSION MICRODISCECTOMY Right 12/31/2013   Procedure: RIGHT L4-5 L5-S1 LAMINECTOMY;  Surgeon: Kristeen Miss, MD;  Location: [facility] OR[facility];  Service: Neurosurgery;  Laterality: Right;  RIGHT L4-5 L5-S1 LAMINECTOMY  . RIGHT HEART CATH N/A 07/25/2018   Procedure: RIGHT HEART CATH;  Surgeon: Larey Dresser, MD;  Location: [facility] LAB;  Service: Cardiovascular;  Laterality: N/A;  . ROTATOR CUFF REPAIR     right  . TONSILLECTOMY    . TONSILLECTOMY    . VENTRAL HERNIA REPAIR N/A 08/23/2012   Procedure: HERNIA REPAIR VENTRAL ADULT;  Surgeon: Madilyn Hook, DO;  Location: [facility];  Service: General;  Laterality: N/A;        Home Medications    Prior to Admission medications   Medication Sig Start Date End Date Taking? Authorizing Provider  allopurinol (ZYLOPRIM) 100 MG tablet Take 50 mg by mouth daily. 12/13/17   [provider]  diltiazem (CARDIZEM CD) 360 MG 24 hr capsule Take 1 capsule (360 mg total) by mouth daily. 09/12/18   Little Ishikawa, MD  finasteride (PROSCAR) 5 MG tablet Take 1 tablet (5 mg total) by mouth daily. Patient taking differently: Take 5 mg by mouth at bedtime.  12/25/12   Norins, Heinz Knuckles, MD  FLUoxetine (PROZAC) 10 MG capsule Take 10 mg by mouth daily. 07/06/18   [provider]  omeprazole (PRILOSEC) 20 MG capsule Take 20 mg by mouth daily.    [provider]  potassium chloride 20 MEQ TBCR Take 10 mEq by  mouth daily. 09/12/18   Little Ishikawa, MD  pramipexole (MIRAPEX) 0.75 MG tablet Take 0.75 mg by mouth at bedtime.    [provider]  spironolactone (ALDACTONE) 25 MG tablet Take 0.5 tablets (12.5 mg total) by mouth daily. 07/26/18   Dhungel, Flonnie Overman, MD  torsemide (DEMADEX) 20 MG tablet Take 3 tablets (60 mg total) by mouth 2 (two) times daily. 09/12/18 10/12/18  Little Ishikawa, MD  traMADol (ULTRAM) 50 MG tablet Take 50 mg by mouth as needed for moderate pain.     [provider]  traZODone (DESYREL) 100 MG tablet Take 100 mg by mouth at bedtime. 12/13/17   [provider]  triazolam (HALCION) 0.25 MG tablet Take 1 tablet by mouth at bedtime as needed for sleep.  02/25/14   [provider]    Family History Family History  Problem Relation Age of Onset  . Heart disease Father        MI 25  . Urticaria Father   .  Prostate cancer Brother   . Prostate cancer Paternal Uncle   . Prostate cancer Paternal Uncle   . Prostate cancer Paternal Uncle   . Hyperlipidemia Other   . Stroke Other   . Hypertension Other   . ADD / ADHD Other   . Colon cancer Neg Hx   . Allergic rhinitis Neg Hx   . Asthma Neg Hx   . Eczema Neg Hx     Social History Social History   Tobacco Use  . Smoking status: Former Smoker    Packs/day: 1.00    Years: 35.00    Pack years: 35.00    Types: Pipe, Cigarettes    Quit date: 02/23/1992    Years since quitting: 26.5  . Smokeless tobacco: Never Used  . Tobacco comment: quit 20 years ago  Substance Use Topics  . Alcohol use: Yes    Comment: 2 drinks daily scotch  AND WINE WITH SUPPER  . Drug use: No     Allergies   Pollen extract-tree extract and Molds & smuts   Review of Systems Review of Systems  Constitutional: Negative for chills and fatigue.  HENT: Negative for congestion.   Respiratory: Negative for cough and shortness of breath.   Cardiovascular: Negative for chest pain.  Gastrointestinal: Negative  for abdominal pain, nausea and vomiting.  Genitourinary: Negative for dysuria.  Neurological: Positive for weakness.  All other systems reviewed and are negative.    Physical Exam Updated Vital Signs BP 104/70   Pulse 72   Temp 97.9 F (36.6 C) (Oral)   Resp 16   Ht 5' 11" (1.803 m)   Wt 93 kg   SpO2 100%   BMI 28.59 kg/m   Physical Exam Constitutional:      Appearance: Normal appearance.     Comments: Patient falls asleep frequently during exam  HENT:     Head: Normocephalic and atraumatic.     Right Ear: External ear normal.     Left Ear: External ear normal.  Neck:     Musculoskeletal: Neck supple.  Cardiovascular:     Rate and Rhythm: Normal rate and regular rhythm.     Heart sounds: Normal heart sounds. No murmur. No friction rub. No gallop.   Pulmonary:     Breath sounds: Wheezing and rales present. No rhonchi.     Comments: Mild expiratory wheezes diffusely and rales in bilateral lung bases Abdominal:     General: Abdomen is flat. There is no distension.     Palpations: Abdomen is soft.     Tenderness: There is no abdominal tenderness. There is no guarding.  Musculoskeletal:        General: No swelling or tenderness.  Skin:    General: Skin is warm and dry.  Neurological:     Cranial Nerves: No cranial nerve deficit.     Motor: No weakness.     Comments: Patient can state name, location, year, and month  Psychiatric:        Mood and Affect: Mood normal.        Behavior: Behavior normal.     ED Treatments / Results  Labs (all labs ordered are listed, but only abnormal results are displayed) Labs Reviewed  CBC - Abnormal; Notable for the following components:      Result Value   RDW 15.8 (*)    All other components within normal limits  BASIC METABOLIC PANEL - Abnormal; Notable for the following components:   Chloride 92 (*)  Glucose, Bld 229 (*)    BUN 36 (*)    Creatinine, Ser 1.65 (*)    Calcium 8.4 (*)    GFR calc non Af Amer 38 (*)     GFR calc Af Amer 44 (*)    All other components within normal limits  POCT I-STAT EG7 - Abnormal; Notable for the following components:   pCO2, Ven 81.2 (*)    pO2, Ven 172.0 (*)    Bicarbonate 39.6 (*)    TCO2 42 (*)    Acid-Base Excess 9.0 (*)    Calcium, Ion 1.10 (*)    All other components within normal limits  SARS CORONAVIRUS 2 (HOSPITAL ORDER, [facility])  I-STAT VENOUS BLOOD GAS, ED    EKG EKG Interpretation  Date/Time:  Wednesday September 20 2018 12:37:39 EDT Ventricular Rate:  76 PR Interval:    QRS Duration: 111 QT Interval:  371 QTC Calculation: 418 R Axis:   112 Text Interpretation:  Sinus rhythm Atrial premature complex Probable right ventricular hypertrophy No significant change since last tracing Confirmed by Blanchie Dessert [phone]) on 09/20/2018 1:18:48 PM   Radiology Dg Chest Portable 1 View  Result Date: 09/20/2018 CLINICAL DATA:  Shortness of breath. EXAM: PORTABLE CHEST 1 VIEW COMPARISON:  Radiographs of September 09, 2018. FINDINGS: Stable cardiomegaly. No pneumothorax is noted. Stable elevated left hemidiaphragm. Mild bibasilar subsegmental atelectasis is noted. No significant pleural effusion is noted. Bony thorax is unremarkable. IMPRESSION: Stable elevated left hemidiaphragm. Mild bibasilar subsegmental atelectasis. Electronically Signed   By: Marijo Conception M.D.   On: 09/20/2018 13:20    Procedures Procedures (including critical care time)  Medications Ordered in ED Medications  furosemide (LASIX) injection 60 mg (has no administration in time range)     Initial Impression / Assessment and Plan / ED Course  I have reviewed the triage vital signs and the nursing notes.  Pertinent labs & imaging results that were available during my care of the patient were reviewed by me and considered in my medical decision making (see chart for details).   4:06 PM Called [facility] nursing facility at number on provided nursing  facility paperwork and left a message on machine with callback number.      Mr. Fogleman is an 85 year old male with PMH of diastolic heart failure who presents from nursing facility with weakness.  Patient is somnolent on exam and VBG reveals PCO2 elevated to 81.  Patient COVID negative and started on BiPAP.  Patient is volume overloaded with crackles in the lung bases and 2+ pitting edema to the knees. Patient does not complain of shortness of breath and is saturating well on room air. Patient reports taking 60 mg twice daily of torsemide and denies missing doses.  Patient given 60 mg IV Lasix in ER.  Creatinine of 1.65 is near patient's baseline.  Patient discussed with triad hospitalist for admission   Final Clinical Impressions(s) / ED Diagnoses   Final diagnoses:  None    ED Discharge Orders    None       Jeanmarie Hubert, MD 09/20/18 1626    Jeanmarie Hubert, MD 09/20/18 1627    Blanchie Dessert, MD 09/21/18 1744

## 2018-09-20 NOTE — ED Triage Notes (Addendum)
Pt presents to the ED from [place] at [facility]. Per EMS patient was increasingly lethargic this morning and appeared short of breath. EMS reports patient normally wears oxygen at baseline, per EMS patient wears 4L at baseline. Pt arrives to the ED with an SpO2 of 100% on 15L via NRB. Pt is drowsy and oriented to person, place and time. Pt reports he wears 2L of oxygen at baseline, denies any shortness of breath, chest pain or complaints. Bilateral lower extremity swelling noted, Pt does appear to have a distended abdomen and some accessory muscle use with breaths. Pt is 100% on 2L via nasal cannula at present time.

## 2018-09-20 NOTE — H&P (Signed)
[facility] History and Physical  DEREN DEGRAZIA QPR:[id number] DOB: 09/04/1931 DOA: 09/20/2018  Referring physician: Jeanmarie Hubert PCP: Chesley Noon, MD   Chief Complaint: lethargy  HPI: Ryan [name] is a 85 y.o. male with medical history significant for non-Hodgkin's lymphoma status post radiation therapy, hypertension, chronic hypoxic respiratory failure on 2 L secondary to COPD/pulmonary fibrosis on home oxygen, hyperlipidemia  who presents on 09/20/2018 from [facility] with increasing lethargy and shortness of breath of 1 day.  Unable to obtain information from nursing facility staff.  Per EMS patient was placed on 15 L nonrebreather with SPO2 100%.    Patient was recently seen admitted 8/6-8/11 for clinical dehydration related to aggressive home diuretic regimen with acute on chronic CKD and atrial tachycardia/MAT. At that time his torsemide dose was discontinued from 100 mg BID to 60 mg BID and metolazone was discontinued on discharge.  Patient reports adherence to his medication regimen, states that his lower legs seem much improved in terms of swelling, he did have a brief period of increased swelling that resolved with compression stockings at his facility.  Denies any dyspnea, orthopnea, PND, fevers, chills, cough, congestion, abdominal pain.  He states he has noticed some decrease in his urination without dysuria or hematuria.  Patient has pulmonary hypertension.  Patient was discontinued off ambrisentan and Adcirca due to concern that this may be contributing to prior episodes of volume overload on most recent admission.     ED Course: EMS placed patient on 15 L via nonrebreather mask for SPO2 of 100% during transport to ED.  In ED patient maintained normal oxygen saturation 100% on 2 L.  Was found to have CO2 of 81 on VBG, with some accessory muscle usage, lethargy and placed on BiPAP.  X-ray showed mild atelectasis and stable cardiomegaly with no  pleural effusion.  Patient was given IV Lasix 60 mg in ED.    Review of Systems:  Constitutional:  No weight loss, night sweats, Fevers, chills, fatigue.  HEENT:  No headaches, Difficulty swallowing,Tooth/dental problems,Sore throat,  No sneezing, itching, ear ache, nasal congestion, post nasal drip,  Cardio-vascular:  No chest pain, Orthopnea, PND, swelling in lower extremities, anasarca, dizziness, palpitations  GI:  No heartburn, indigestion, abdominal pain, nausea, vomiting, diarrhea, change in bowel habits, loss of appetite  Resp:  No shortness of breath with exertion or at rest. No excess mucus, no productive cough, No non-productive cough, No coughing up of blood.No change in color of mucus.No wheezing.No chest wall deformity  Skin:  no rash or lesions.  GU:  no dysuria, change in color of urine, no urgency or frequency. No flank pain.  Musculoskeletal:  No joint pain or swelling. No decreased range of motion. No back pain.  Psych:  No change in mood or affect. No depression or anxiety. No memory loss.   Past Medical History:  Diagnosis Date   Asthma    Barrett's esophagus    Benign prostatic hypertrophy    Bilateral lower extremity edema    CHF (congestive heart failure) (HCC)    Chronic fatigue    COPD (chronic obstructive pulmonary disease) (HCC)    Diverticulosis of colon (without mention of hemorrhage)    Esophageal reflux    Gastritis    Gluten intolerance    Hiatal hernia 02/10/11   Noted on CT Scan - Moderate Hiatal Hernia   Hyperlipemia    Hypertension    Lymphoma of lymph nodes in pelvis ([place]) 03/03/2011  Large Right Retroperitoneal Mass   Melanoma ([place])    lymphoma   Microcytic anemia 04/20/2011    NHL (non-Hodgkin's lymphoma) (HCC)    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 Collene Mares   Renal cyst 02/10/11    Noted on CT Scan - Bilateral Renal Cysts   S/P radiation therapy 03/15/11 - 04/09/11   Abdominal/ Pelvic Tumor, 3600 cGy/20 Fractions   Past Surgical History:  Procedure Laterality Date   ARTHROSCOPIC REPAIR ACL     right   BONE MARROW ASPIRATION  02/25/11   Bone Marrow, Aspirate, Clot, and Bilateral Bx, Right PIC   CATARACT EXTRACTION, BILATERAL     EYE SURGERY     HERNIA REPAIR     LEFT INGUINAL    INSERTION OF MESH N/A 08/23/2012   Procedure: INSERTION OF MESH;  Surgeon: Madilyn Hook, DO;  Location: [facility];  Service: General;  Laterality: N/A;   LUMBAR LAMINECTOMY/DECOMPRESSION MICRODISCECTOMY Right 12/31/2013   Procedure: RIGHT L4-5 L5-S1 LAMINECTOMY;  Surgeon: Kristeen Miss, MD;  Location: [facility];  Service: Neurosurgery;  Laterality: Right;  RIGHT L4-5 L5-S1 LAMINECTOMY   RIGHT HEART CATH N/A 07/25/2018   Procedure: RIGHT HEART CATH;  Surgeon: Larey Dresser, MD;  Location: [facility];  Service: Cardiovascular;  Laterality: N/A;   ROTATOR CUFF REPAIR     right   TONSILLECTOMY     TONSILLECTOMY     VENTRAL HERNIA REPAIR N/A 08/23/2012   Procedure: HERNIA REPAIR VENTRAL ADULT;  Surgeon: Madilyn Hook, DO;  Location: [facility];  Service: General;  Laterality: N/A;   Social History:  reports that he quit smoking about 26 years ago. His smoking use included pipe and cigarettes. He has a 35.00 pack-year smoking history. He has never used smokeless tobacco. He reports current alcohol use. He reports that he does not use drugs.  Allergies  Allergen Reactions   Pollen Extract-Tree Extract Other (See Comments)    HEADACHES, TIRED , DRAINAGE FROM SINUSES   Molds & Smuts Other (See Comments)    Also dust mites causes sinus infections, h/a etc.    Family History  Problem Relation Age of Onset   Heart disease Father        MI 69   Urticaria Father    Prostate cancer Brother    Prostate cancer Paternal Uncle     Prostate cancer Paternal Uncle    Prostate cancer Paternal Uncle    Hyperlipidemia Other    Stroke Other    Hypertension Other    ADD / ADHD Other    Colon cancer Neg Hx    Allergic rhinitis Neg Hx    Asthma Neg Hx    Eczema Neg Hx       Prior to Admission medications   Medication Sig Start Date End Date Taking? Authorizing Provider  allopurinol (ZYLOPRIM) 100 MG tablet Take 50 mg by mouth daily. 12/13/17   [provider]  diltiazem (CARDIZEM CD) 360 MG 24 hr capsule Take 1 capsule (360 mg total) by mouth daily. 09/12/18   Little Ishikawa, MD  finasteride (PROSCAR) 5 MG tablet Take 1 tablet (5 mg total) by mouth daily. Patient taking differently: Take 5 mg by mouth at bedtime.  12/25/12   Norins, Heinz Knuckles, MD  FLUoxetine (PROZAC) 10 MG capsule Take 10 mg by mouth daily.  07/06/18   [provider]  omeprazole (PRILOSEC) 20 MG capsule Take 20 mg by mouth daily.    [provider]  potassium chloride 20 MEQ TBCR Take 10 mEq by mouth daily. 09/12/18   Little Ishikawa, MD  pramipexole (MIRAPEX) 0.75 MG tablet Take 0.75 mg by mouth at bedtime.    [provider]  spironolactone (ALDACTONE) 25 MG tablet Take 0.5 tablets (12.5 mg total) by mouth daily. 07/26/18   Dhungel, Flonnie Overman, MD  torsemide (DEMADEX) 20 MG tablet Take 3 tablets (60 mg total) by mouth 2 (two) times daily. 09/12/18 10/12/18  Little Ishikawa, MD  traMADol (ULTRAM) 50 MG tablet Take 50 mg by mouth as needed for moderate pain.     [provider]  traZODone (DESYREL) 100 MG tablet Take 100 mg by mouth at bedtime. 12/13/17   [provider]  triazolam (HALCION) 0.25 MG tablet Take 1 tablet by mouth at bedtime as needed for sleep.  02/25/14   [provider]   Physical Exam: Vitals:   09/20/18 1430 09/20/18 1500 09/20/18 1530 09/20/18 1600  BP: 118/65 120/73 119/82 104/70  Pulse: 75 76 72 72  Resp: _0 Temp:      TempSrc:      SpO2:  100% 100% 100% 100%  Weight:      Height:        Wt Readings from Last 3 Encounters:  09/20/18 93 kg  09/12/18 93.7 kg  08/25/18 95.6 kg    Constitutional obese elderly male, in no acute distress Eyes: EOMI, anicteric ENMT: Oropharynx with moist mucous membranes, normal dentition Neck: FROM,  Cardiovascular: No appreciable JVD, 2+ pitting edema from ankle to mid calf, soft but distended abdomen, RRR no MRGs Respiratory: Able to speak in complete sentences on BiPAP, some scant accessory muscle usage (abdomen), no crackles, no wheezing Abdomen: Soft,non-tender, distended abdomen, normal bowel sounds Skin: No rash ulcers, or lesions. Without skin tenting  Neurologic: Grossly no focal neuro deficit. Psychiatric:Appropriate affect, and mood. Mental status AAOx3          Labs on Admission:  Basic Metabolic Panel: Recent Labs  Lab 09/20/18 1327 09/20/18 1339  NA 135 135  K 4.5 4.5  CL 92*  --   CO2 30  --   GLUCOSE 229*  --   BUN 36*  --   CREATININE 1.65*  --   CALCIUM 8.4*  --    Liver Function Tests: No results for input(s): AST, ALT, ALKPHOS, BILITOT, PROT, ALBUMIN in the last 168 hours. No results for input(s): LIPASE, AMYLASE in the last 168 hours. No results for input(s): AMMONIA in the last 168 hours. CBC: Recent Labs  Lab 09/20/18 1327 09/20/18 1339  WBC 6.5  --   HGB 13.6 15.0  HCT 44.9 44.0  MCV 95.3  --   PLT 202  --    Cardiac Enzymes: No results for input(s): CKTOTAL, CKMB, CKMBINDEX, TROPONINI in the last 168 hours.  BNP (last 3 results) Recent Labs    03/08/18 1859 07/19/18 1750 09/07/18 1241  BNP 40.3 36.1 40.6    ProBNP (last 3 results) No results for input(s): PROBNP in the last 8760 hours.  CBG: No results for input(s): GLUCAP in the last 168 hours.  Radiological Exams on Admission: Dg Chest Portable 1 View  Result Date: 09/20/2018 CLINICAL DATA:  Shortness of breath. EXAM: PORTABLE CHEST 1 VIEW COMPARISON:  Radiographs of September 09, 2018. FINDINGS: Stable cardiomegaly. No  pneumothorax is noted. Stable elevated left hemidiaphragm. Mild bibasilar subsegmental atelectasis is noted. No significant pleural effusion is noted. Bony thorax is unremarkable. IMPRESSION: Stable elevated left hemidiaphragm. Mild bibasilar subsegmental atelectasis. Electronically Signed   By: Marijo Conception M.D.   On: 09/20/2018 13:20    EKG: Independently reviewed. unchanged from previous tracings.  Assessment/Plan Active Problems:   Asthma/COPD   Acute respiratory failure with hypoxia and hypercarbia (HCC)   CKD (chronic kidney disease) stage 3, GFR 30-59 ml/min (HCC)   Lethargy   Acute metabolic encephalopathy  Acute hypercarbic respiratory failure likely related to sedative medications and patient with chronic pulmonary lung disease, chronic hypoxic respiratory failure.  Suspect CO2 retention likely due to sedating medications (opioids).  In the setting of chronic lung disease/pulmonary hypertension/CHF.  Will hold home sedatives.  Continue BiPAP and repeat ABG to monitor CO2.  Do not suspect CHF is contributing based off lack of symptoms, clear chest x-ray, will evaluate BNP, closely monitor volume status.  Acute metabolic encephalopathy, improving.  Increased lethargy/somnolence at facility likely related to leading to hypercarbia mentioned above.  Already improving on BiPAP.  Alert and oriented x4 on my exam  Chronic diastolic CHF (last TTE 6/27, EF 60-65%) do not suspect flare given lack of symptoms, stable weight(per chart) and stable peripheral edema per patient.  Has 2+ pitting edema of lower legs but states this is his typical amount of swelling without dyspnea, PND, or orthopnea and stable weight ( 93 kg on admission actually less than 93.7kg on discharge on 8/11).  No pulmonary edema to suggest fluid overload as likely etiology to CO2 retention.  Will check BNP.  Status post IV Lasix in ED, will hold off on further diuresis as I do not  think he is in acute flare, strict I's and O's, daily weights, fluid restriction 1800 cc, low-sodium diet.  May still warrant advanced heart failure consultation based on clinical status in a.m if no improvement on BiPAP, hold on home torsemide 60 mg twice daily and spironolactone for now.  Mild pulmonary hypertension, unclear etiology.  Has not tolerated Adcirca and ambrisentan due to significant volume overload in the past.  Degree of PAH seems out of proportion to his known pulmonary disease per cardiology evaluations.  Differential also includes OSA, chronic PE  Type 2 diabetes.  A1c 8.6.  No home meds.  Sliding scale, monitor CBG here  CKD, stage III.  Creatinine stable at baseline (1.4-1.9).  Avoid nephrotoxins, monitor BMP  BPH.  Continue finasteride.  Chronic back pain.  Holding home tramadol.,  Stable.  Continue PPI.  Depression, stable.  Continue Prozac.  Gout, stable.  Continue allopurinol    Consultants: none Code Status: DNR, discussed on day of admission, call patient's wife per patient's request but she did not answer.  In meantime patient confirms he is DNR, currently does not want intubation respiratory status worsens would like to discuss with his spouse DVT Prophylaxis: Heparin Family Communication: Called wife at listed number Disposition Plan: Admitted as inpatient to progressive unit to continue BiPAP for acute hypercarbia with related metabolic encephalopathy and significant respiratory distress.  Close monitoring respiratory status.  Close monitoring of volume status as patient may need diuresis if CHF is indeed contributing    Desiree Hane MD [facility]  Pager [phone]  If 7PM-7AM, please contact night-coverage [email or web address] Password [facility]  09/20/2018, 4:34 PM

## 2018-09-20 NOTE — ED Notes (Signed)
Pt resting comfortably in bed with Bipap on

## 2018-09-20 NOTE — ED Notes (Signed)
Patient's wife updated that patient is going to be admitted.

## 2018-09-20 NOTE — ED Notes (Signed)
Pt wife [name] [phone]

## 2018-09-20 NOTE — ED Notes (Signed)
Notified ED MD of I-stat result.

## 2018-09-21 ENCOUNTER — Encounter (HOSPITAL_COMMUNITY): Payer: Self-pay

## 2018-09-21 DIAGNOSIS — I5031 Acute diastolic (congestive) heart failure: Secondary | ICD-10-CM

## 2018-09-21 LAB — BASIC METABOLIC PANEL
Anion gap: 11 (ref 5–15)
BUN: 27 mg/dL — ABNORMAL HIGH (ref 8–23)
CO2: 32 mmol/L (ref 22–32)
Calcium: 8.4 mg/dL — ABNORMAL LOW (ref 8.9–10.3)
Chloride: 95 mmol/L — ABNORMAL LOW (ref 98–111)
Creatinine, Ser: 1.4 mg/dL — ABNORMAL HIGH (ref 0.61–1.24)
GFR calc Af Amer: 53 mL/min — ABNORMAL LOW (ref >60)
GFR calc non Af Amer: 46 mL/min — ABNORMAL LOW (ref >60)
Glucose, Bld: 146 mg/dL — ABNORMAL HIGH (ref 70–99)
Potassium: 4.1 mmol/L (ref 3.5–5.1)
Sodium: 138 mmol/L (ref 135–145)

## 2018-09-21 LAB — GLUCOSE, CAPILLARY
Glucose-Capillary: 131 mg/dL — ABNORMAL HIGH (ref 70–99)
Glucose-Capillary: 147 mg/dL — ABNORMAL HIGH (ref 70–99)
Glucose-Capillary: 112 mg/dL — ABNORMAL HIGH (ref 70–99)
Glucose-Capillary: 76 mg/dL (ref 70–99)

## 2018-09-21 LAB — CBC
HCT: 45.7 % (ref 39.0–52.0)
Hemoglobin: 13.7 g/dL (ref 13.0–17.0)
MCH: 28.5 pg (ref 26.0–34.0)
MCHC: 30 g/dL (ref 30.0–36.0)
MCV: 95 fL (ref 80.0–100.0)
Platelets: 212 10*3/uL (ref 150–400)
RBC: 4.81 MIL/uL (ref 4.22–5.81)
RDW: 15.9 % — ABNORMAL HIGH (ref 11.5–15.5)
WBC: 7.7 10*3/uL (ref 4.0–10.5)
nRBC: 0 % (ref 0.0–0.2)

## 2018-09-21 MED ORDER — FUROSEMIDE 10 MG/ML IJ SOLN
40.0000 mg | Freq: Two times a day (BID) | INTRAMUSCULAR | Status: DC
  Administered 2018-09-21 – 2018-09-23 (×5): 40 mg via INTRAVENOUS
  Filled 2018-09-21 (×5): qty 4

## 2018-09-21 MED ORDER — GLIPIZIDE 5 MG PO TABS
2.5000 mg | ORAL_TABLET | Freq: Every day | ORAL | Status: DC
  Administered 2018-09-21 – 2018-09-26 (×6): 2.5 mg via ORAL
  Filled 2018-09-21 (×6): qty 1

## 2018-09-21 MED ORDER — WHITE PETROLATUM EX OINT
TOPICAL_OINTMENT | CUTANEOUS | Status: AC
  Administered 2018-09-21: 0.2
  Filled 2018-09-21: qty 28.35

## 2018-09-21 NOTE — Progress Notes (Signed)
Pt transferred from ED on BiPAP to room 5W 04. Pt  Has no c/o. He is a progressive, but no progressive monitor available at this time. Telemetry box 43 initiate and continuous pulse ox

## 2018-09-21 NOTE — Progress Notes (Signed)
Patient taken off bipap and put on 4L . Patient up talking to RT. RN aware patient is off bipap. Patient is tolerating well at this time. RT will continue to monitor.

## 2018-09-21 NOTE — Social Work (Signed)
Consult for hospice; requested PMT consult for [place] and most appropriate referral. Will f/u once PMT has met with pt and family.  Westley Hummer, MSW, [place] Work [phone]

## 2018-09-21 NOTE — Progress Notes (Signed)
Inpatient Diabetes Program Recommendations  AACE/ADA: New Consensus Statement on Inpatient Glycemic Control (2015)  Target Ranges:  Prepandial:   less than 140 mg/dL      Peak postprandial:   less than 180 mg/dL (1-2 hours)      Critically ill patients:  140 - 180 mg/dL   Lab Results  Component Value Date   GLUCAP 131 (H) 09/21/2018   HGBA1C 8.6 (H) 09/07/2018    Review of Glycemic Control  Diabetes history: DM2 (newly-diagnosed last admission) Outpatient Diabetes medications: None Current orders for Inpatient glycemic control: Novolog 0-9 units tidwc  HgbA1C - 8.6%  Inpatient Diabetes Program Recommendations:     Agree with orders.  Would not send pt home on insulin since he lives alone. Maybe low dose glipizide or Amaryl.  Will follow closely.  Thank you. Lorenda Peck, RD, LDN, CDE Inpatient Diabetes Coordinator [phone]

## 2018-09-21 NOTE — Progress Notes (Signed)
PROGRESS NOTE    Ryan [name]  OBS:[id number] DOB: 20-May-1931 DOA: 09/20/2018 PCP: Chesley Noon, MD   Brief Narrative:  85 year old male with history of non-Hodgkin's lymphoma status post radiation, essential hypertension, chronic hypoxic respiratory failure on 2 L nasal cannula, COPD/pulmonary fibrosis, hyperlipidemia came to the hospital with complaints of increasing lethargy and shortness of breath.  Recently his diuretics for reduced due to clinical signs of dehydration.  Upon admission he was noted to be in hypercarbic respiratory failure requiring BiPAP.   Assessment & Plan:   Active Problems:   BPH (benign prostatic hyperplasia)   RLS (restless legs syndrome)   Lumbar radiculopathy   Asthma/COPD   Chronic diastolic CHF (congestive heart failure) (HCC)   Pulmonary hypertension (HCC)   Acute respiratory failure with hypoxia and hypercarbia (HCC)   CKD (chronic kidney disease) stage 3, GFR 30-59 ml/min (HCC)   Lethargy   Acute metabolic encephalopathy   CHF (congestive heart failure) (HCC)   Acute hypercarbic respiratory failure Acute on chronic hypoxia, currently on 4 L nasal cannula.  On 2 L at home Acute on chronic diastolic congestive heart failure with preserved ejection fraction 60 to 65%, class III -This is improved after BiPAP use overnight.  Feels clinically volume overloaded with bilateral lower extremity pitting edema.  We will continue fluid restriction.  Low-salt diet. - Lasix 40 mg IV twice daily, monitor electrolytes. -Hold AVSS volume status. -Last echocardiogram 6/20-showed ejection fraction 60 to 65%. -Wean off oxygen. -Aldactone 12.5 mg orally daily  Acute metabolic encephalopathy -This is secondary to CO2 narcosis.  This is resolved.  Mild pulmonary hypertension -This is combination of underlying cardiac disease and also his pulmonary fibrosis.  Diabetes mellitus type 2 -Not on any home medication.  Previous visit showed hemoglobin A1c 8.6.   Continue insulin sliding scale and Accu-Chek -We will consult diabetic coordinator, may need to start him on oral diabetic regimen.  Unless the patient prefers strict diet  Chronic kidney disease stage III -Creatinine around baseline of 1.8.  Today's 1.4  History of BPH -Continue Proscar  Chronic low back pain -Pain control with Tylenol  History of depression, stable -Daily Prozac  History of gout, stable -Allopurinol  DVT prophylaxis: Subcutaneous heparin Code Status: DNR Family Communication: None at bedside Disposition Plan: Maintain hospital stay for IV diuretics, maintain inpatient stay.  Consultants:   None  Procedures:   None  Antimicrobials:   None   Subjective: Patient states he feels much better this morning after using BiPAP overnight.  Tells me he has been very claustrophobic with BiPAP use due to small incident when he was a child and went into caves with his friends.  Reports of bilateral lower extremity swelling and exertional dyspnea.  Review of Systems Otherwise negative except as per HPI, including: General: Denies fever, chills, night sweats or unintended weight loss. Resp: Denies cough,  Cardiac: Denies chest pain, palpitations, orthopnea, paroxysmal nocturnal dyspnea. GI: Denies abdominal pain, nausea, vomiting, diarrhea or constipation GU: Denies dysuria, frequency, hesitancy or incontinence MS: Denies muscle aches, joint pain or swelling Neuro: Denies headache, neurologic deficits (focal weakness, numbness, tingling), abnormal gait Psych: Denies anxiety, depression, SI/HI/AVH Skin: Denies new rashes or lesions ID: Denies sick contacts, exotic exposures, travel  Objective: Vitals:   09/21/18 0018 09/21/18 0044 09/21/18 0405 09/21/18 0807  BP: 111/68  121/79   Pulse: 66  67   Resp: _0 Temp: 98.6 F (37 C)  97.7 F (36.5 C)  TempSrc:   Oral   SpO2: 100% 96% 100% 99%  Weight:      Height:        Intake/Output Summary  (Last 24 hours) at 09/21/2018 1142 Last data filed at 09/21/2018 0906 Gross per 24 hour  Intake 240 ml  Output 1000 ml  Net -760 ml   Filed Weights   09/20/18 1240  Weight: 93 kg    Examination:  General exam: Appears calm and comfortable, on 4 L nasal cannula Respiratory system: Bibasilar crackles more on the left than right Cardiovascular system: S1 & S2 heard, RRR. No JVD, murmurs, rubs, gallops or clicks.  3+ bilateral lower extremity pitting edema Gastrointestinal system: Abdomen is nondistended, soft and nontender. No organomegaly or masses felt. Normal bowel sounds heard. Central nervous system: Alert and oriented. No focal neurological deficits. Extremities: Symmetric 5 x 5 power. Skin: No rashes, lesions or ulcers Psychiatry: Judgement and insight appear normal. Mood & affect appropriate.     Data Reviewed:   CBC: Recent Labs  Lab 09/20/18 1327 09/20/18 1339 09/21/18 0215  WBC 6.5  --  7.7  HGB 13.6 15.0 13.7  HCT 44.9 44.0 45.7  MCV 95.3  --  95.0  PLT 202  --  801   Basic Metabolic Panel: Recent Labs  Lab 09/20/18 1327 09/20/18 1339 09/21/18 0215  NA 135 135 138  K 4.5 4.5 4.1  CL 92*  --  95*  CO2 30  --  32  GLUCOSE 229*  --  146*  BUN 36*  --  27*  CREATININE 1.65*  --  1.40*  CALCIUM 8.4*  --  8.4*   GFR: Estimated Creatinine Clearance: 45.8 mL/min (A) (by C-G formula based on SCr of 1.4 mg/dL (H)). Liver Function Tests: No results for input(s): AST, ALT, ALKPHOS, BILITOT, PROT, ALBUMIN in the last 168 hours. No results for input(s): LIPASE, AMYLASE in the last 168 hours. No results for input(s): AMMONIA in the last 168 hours. Coagulation Profile: No results for input(s): INR, PROTIME in the last 168 hours. Cardiac Enzymes: No results for input(s): CKTOTAL, CKMB, CKMBINDEX, TROPONINI in the last 168 hours. BNP (last 3 results) No results for input(s): PROBNP in the last 8760 hours. HbA1C: No results for input(s): HGBA1C in the last 72  hours. CBG: Recent Labs  Lab 09/21/18 0730  GLUCAP 131*   Lipid Profile: No results for input(s): CHOL, HDL, LDLCALC, TRIG, CHOLHDL, LDLDIRECT in the last 72 hours. Thyroid Function Tests: No results for input(s): TSH, T4TOTAL, FREET4, T3FREE, THYROIDAB in the last 72 hours. Anemia Panel: No results for input(s): VITAMINB12, FOLATE, FERRITIN, TIBC, IRON, RETICCTPCT in the last 72 hours. Sepsis Labs: No results for input(s): PROCALCITON, LATICACIDVEN in the last 168 hours.  Recent Results (from the past 240 hour(s))  SARS Coronavirus 2 [facility] order, Performed in [facility] hospital lab) Nasopharyngeal Nasopharyngeal Swab     Status: None   Collection Time: 09/20/18  2:32 PM   Specimen: Nasopharyngeal Swab  Result Value Ref Range Status   SARS Coronavirus 2 NEGATIVE NEGATIVE Final    Comment: (NOTE) If result is NEGATIVE SARS-CoV-2 target nucleic acids are NOT DETECTED. The SARS-CoV-2 RNA is generally detectable in upper and lower  respiratory specimens during the acute phase of infection. The lowest  concentration of SARS-CoV-2 viral copies this assay can detect is 250  copies / mL. A negative result does not preclude SARS-CoV-2 infection  and should not be used as the sole basis for treatment or  other  patient management decisions.  A negative result may occur with  improper specimen collection / handling, submission of specimen other  than nasopharyngeal swab, presence of viral mutation(s) within the  areas targeted by this assay, and inadequate number of viral copies  (<250 copies / mL). A negative result must be combined with clinical  observations, patient history, and epidemiological information. If result is POSITIVE SARS-CoV-2 target nucleic acids are DETECTED. The SARS-CoV-2 RNA is generally detectable in upper and lower  respiratory specimens dur ing the acute phase of infection.  Positive  results are indicative of active infection with SARS-CoV-2.  Clinical   correlation with patient history and other diagnostic information is  necessary to determine patient infection status.  Positive results do  not rule out bacterial infection or co-infection with other viruses. If result is PRESUMPTIVE POSTIVE SARS-CoV-2 nucleic acids MAY BE PRESENT.   A presumptive positive result was obtained on the submitted specimen  and confirmed on repeat testing.  While 2019 novel coronavirus  (SARS-CoV-2) nucleic acids may be present in the submitted sample  additional confirmatory testing may be necessary for epidemiological  and / or clinical management purposes  to differentiate between  SARS-CoV-2 and other Sarbecovirus currently known to infect humans.  If clinically indicated additional testing with an alternate test  methodology [phone]) is advised. The SARS-CoV-2 RNA is generally  detectable in upper and lower respiratory sp ecimens during the acute  phase of infection. The expected result is Negative. Fact Sheet for Patients:  StrictlyIdeas.no Fact Sheet for Healthcare Providers: BankingDealers.co.za This test is not yet approved or cleared by the [place] FDA and has been authorized for detection and/or diagnosis of SARS-CoV-2 by FDA under an Emergency Use Authorization (EUA).  This EUA will remain in effect (meaning this test can be used) for the duration of the COVID-19 declaration under Section 564(b)(1) of the Act, 21 U.S.C. section 360bbb-3(b)(1), unless the authorization is terminated or revoked sooner. Performed at [facility], [place] [address], [place], [place] [address]          Radiology Studies: Dg Chest Portable 1 View  Result Date: 09/20/2018 CLINICAL DATA:  Shortness of breath. EXAM: PORTABLE CHEST 1 VIEW COMPARISON:  Radiographs of September 09, 2018. FINDINGS: Stable cardiomegaly. No pneumothorax is noted. Stable elevated left hemidiaphragm. Mild bibasilar subsegmental  atelectasis is noted. No significant pleural effusion is noted. Bony thorax is unremarkable. IMPRESSION: Stable elevated left hemidiaphragm. Mild bibasilar subsegmental atelectasis. Electronically Signed   By: Marijo Conception M.D.   On: 09/20/2018 13:20        Scheduled Meds: . allopurinol  50 mg Oral Daily  . finasteride  5 mg Oral QHS  . FLUoxetine  10 mg Oral Daily  . furosemide  40 mg Intravenous BID  . heparin  5,000 Units Subcutaneous Q8H  . insulin aspart  0-9 Units Subcutaneous TID WC  . pantoprazole  40 mg Oral Daily  . pramipexole  0.75 mg Oral QHS  . spironolactone  12.5 mg Oral Daily   Continuous Infusions:   LOS: 1 day   Time spent= 35 mins    Ankit Arsenio Loader, MD [facility]  If 7PM-7AM, please contact night-coverage [email or web address] 09/21/2018, 11:42 AM

## 2018-09-22 DIAGNOSIS — I5033 Acute on chronic diastolic (congestive) heart failure: Secondary | ICD-10-CM

## 2018-09-22 DIAGNOSIS — J9601 Acute respiratory failure with hypoxia: Secondary | ICD-10-CM

## 2018-09-22 DIAGNOSIS — J9602 Acute respiratory failure with hypercapnia: Secondary | ICD-10-CM

## 2018-09-22 LAB — CBC
HCT: 43 % (ref 39.0–52.0)
Hemoglobin: 13.5 g/dL (ref 13.0–17.0)
MCH: 29 pg (ref 26.0–34.0)
MCHC: 31.4 g/dL (ref 30.0–36.0)
MCV: 92.3 fL (ref 80.0–100.0)
Platelets: 230 10*3/uL (ref 150–400)
RBC: 4.66 MIL/uL (ref 4.22–5.81)
RDW: 15.9 % — ABNORMAL HIGH (ref 11.5–15.5)
WBC: 7 10*3/uL (ref 4.0–10.5)
nRBC: 0 % (ref 0.0–0.2)

## 2018-09-22 LAB — GLUCOSE, CAPILLARY
Glucose-Capillary: 170 mg/dL — ABNORMAL HIGH (ref 70–99)
Glucose-Capillary: 120 mg/dL — ABNORMAL HIGH (ref 70–99)
Glucose-Capillary: 138 mg/dL — ABNORMAL HIGH (ref 70–99)
Glucose-Capillary: 183 mg/dL — ABNORMAL HIGH (ref 70–99)

## 2018-09-22 LAB — BASIC METABOLIC PANEL
Anion gap: 12 (ref 5–15)
BUN: 27 mg/dL — ABNORMAL HIGH (ref 8–23)
CO2: 32 mmol/L (ref 22–32)
Calcium: 8.4 mg/dL — ABNORMAL LOW (ref 8.9–10.3)
Chloride: 95 mmol/L — ABNORMAL LOW (ref 98–111)
Creatinine, Ser: 1.46 mg/dL — ABNORMAL HIGH (ref 0.61–1.24)
GFR calc Af Amer: 50 mL/min — ABNORMAL LOW (ref >60)
GFR calc non Af Amer: 44 mL/min — ABNORMAL LOW (ref >60)
Glucose, Bld: 131 mg/dL — ABNORMAL HIGH (ref 70–99)
Potassium: 3.9 mmol/L (ref 3.5–5.1)
Sodium: 139 mmol/L (ref 135–145)

## 2018-09-22 LAB — BRAIN NATRIURETIC PEPTIDE: B Natriuretic Peptide: 104.3 pg/mL — ABNORMAL HIGH (ref 0.0–100.0)

## 2018-09-22 LAB — MAGNESIUM: Magnesium: 2 mg/dL (ref 1.7–2.4)

## 2018-09-22 MED ORDER — POTASSIUM CHLORIDE CRYS ER 20 MEQ PO TBCR
40.0000 meq | EXTENDED_RELEASE_TABLET | Freq: Once | ORAL | Status: AC
  Administered 2018-09-22: 40 meq via ORAL
  Filled 2018-09-22: qty 2

## 2018-09-22 MED ORDER — METOLAZONE 2.5 MG PO TABS
2.5000 mg | ORAL_TABLET | Freq: Once | ORAL | Status: AC
  Administered 2018-09-22: 12:00:00 2.5 mg via ORAL
  Filled 2018-09-22: qty 1

## 2018-09-22 MED ORDER — DILTIAZEM HCL ER COATED BEADS 180 MG PO CP24
180.0000 mg | ORAL_CAPSULE | Freq: Every day | ORAL | Status: DC
  Administered 2018-09-22 – 2018-09-23 (×2): 180 mg via ORAL
  Filled 2018-09-22 (×2): qty 1

## 2018-09-22 MED ORDER — SALINE SPRAY 0.65 % NA SOLN
1.0000 | NASAL | Status: DC | PRN
  Administered 2018-09-22: 1 via NASAL
  Filled 2018-09-22: qty 44

## 2018-09-22 NOTE — Evaluation (Signed)
Physical Therapy Evaluation Patient Details Name: Ryan [name] MRN: [id number] DOB: 05/24/1931 Today's Date: 09/22/2018   History of Present Illness  85 yo with history of interstitial lung disease (?NSIP vs UIP) on home oxygen, pulmonary hypertension, diastolic CHF, and prior non-Hodgkins lymphoma (pelvic mass, s/p XRT).  Pt admitted with increasing lethargy and shortness of breath.  Clinical Impression  Pt admitted with above diagnosis.  Pt currently with functional limitations due to the deficits listed below (see PT Problem List). Pt will benefit from skilled PT to increase their independence and safety with mobility to allow discharge to the venue listed below.  Pt required some encouragement to ambulate as he c/o fatigue in legs, but was able to ambulate with RW around bed with o2 staying in the 90's.  Per chart, palliative is meeting with pt.  At this time, recommend HHPT with S, but will continue to assess during hospital stay.    Follow Up Recommendations Home health PT;Supervision for mobility/OOB;Supervision/Assistance - 24 hour    Equipment Recommendations  None recommended by PT    Recommendations for Other Services       Precautions / Restrictions Precautions Precautions: Fall Precaution Comments: Reports frequent falls in the bathroom.      Mobility  Bed Mobility               General bed mobility comments: sitting up on EOB  Transfers Overall transfer level: Needs assistance Equipment used: Rolling walker (2 wheeled) Transfers: Sit to/from Stand Sit to Stand: Min assist;Min guard         General transfer comment: MIN A to for initial push and then min/guard  Ambulation/Gait Ambulation/Gait assistance: Min guard Gait Distance (Feet): 15 Feet Assistive device: Rolling walker (2 wheeled) Gait Pattern/deviations: Step-through pattern;Decreased step length - right;Decreased step length - left;Shuffle     General Gait Details: min/guard with cues  for safety with RW and shuffled steps. O2 in 90's throughout session on o2.  Stairs            Wheelchair Mobility    Modified Rankin (Stroke Patients Only)       Balance Overall balance assessment: Needs assistance Sitting-balance support: Feet supported;No upper extremity supported Sitting balance-Leahy Scale: Good     Standing balance support: Bilateral upper extremity supported Standing balance-Leahy Scale: Poor Standing balance comment: requires UE support. C/o leg fatigue in standing.                             Pertinent Vitals/Pain Pain Assessment: No/denies pain    Home Living Family/patient expects to be discharged to:: Assisted living Living Arrangements: Alone Available Help at Discharge: Family;Available PRN/intermittently;Personal care attendant Type of Home: Independent living facility         Home Equipment: Walker - 4 wheels;Wheelchair - manual      Prior Function Level of Independence: Needs assistance   Gait / Transfers Assistance Needed: Ambulating in apartment with rollator and using w/c for longer distancex.     Comments: States he is getting a Neurosurgeon Dominance        Extremity/Trunk Assessment   Upper Extremity Assessment Upper Extremity Assessment: Defer to OT evaluation    Lower Extremity Assessment Lower Extremity Assessment: Generalized weakness(edema B'ly)       Communication   Communication: No difficulties  Cognition Arousal/Alertness: Awake/alert Behavior During Therapy: WFL for tasks assessed/performed Overall Cognitive Status: No family/caregiver present to  determine baseline cognitive functioning                       Memory: Decreased short-term memory Following Commands: Follows one step commands with increased time;Follows multi-step commands with increased time     Problem Solving: Slow processing        General Comments      Exercises     Assessment/Plan     PT Assessment Patient needs continued PT services  PT Problem List Decreased strength;Decreased activity tolerance;Decreased mobility;Decreased balance;Cardiopulmonary status limiting activity;Decreased knowledge of use of DME;Decreased safety awareness       PT Treatment Interventions DME instruction;Gait training;Functional mobility training;Therapeutic activities;Therapeutic exercise;Balance training;Cognitive remediation;Patient/family education    PT Goals (Current goals can be found in the Care Plan section)  Acute Rehab PT Goals Patient Stated Goal: go home PT Goal Formulation: With patient Time For Goal Achievement: 10/06/18 Potential to Achieve Goals: Fair    Frequency Min 3X/week   Barriers to discharge        Co-evaluation PT/OT/SLP Co-Evaluation/Treatment: Yes Reason for Co-Treatment: Complexity of the patient's impairments (multi-system involvement);For patient/therapist safety PT goals addressed during session: Mobility/safety with mobility;Balance         AM-PAC PT "6 Clicks" Mobility  Outcome Measure Help needed turning from your back to your side while in a flat bed without using bedrails?: A Little Help needed moving from lying on your back to sitting on the side of a flat bed without using bedrails?: A Little Help needed moving to and from a bed to a chair (including a wheelchair)?: A Little Help needed standing up from a chair using your arms (e.g., wheelchair or bedside chair)?: A Little Help needed to walk in hospital room?: A Little Help needed climbing 3-5 steps with a railing? : A Lot 6 Click Score: 17    End of Session Equipment Utilized During Treatment: Gait belt;Oxygen Activity Tolerance: Patient tolerated treatment well Patient left: in chair;with call bell/phone within reach;with chair alarm set Nurse Communication: Mobility status PT Visit Diagnosis: Unsteadiness on feet (R26.81);Other abnormalities of gait and mobility (R26.89);Muscle  weakness (generalized) (M62.81);History of falling (Z91.81);Difficulty in walking, not elsewhere classified (R26.2)    Time: 7414-2395 PT Time Calculation (min) (ACUTE ONLY): 27 min   Charges:   PT Evaluation $PT Eval Moderate Complexity: 1 Mod          Alanea Woolridge L. Tamala Julian, [place] Pager [phone] 09/22/2018   Galen Manila 09/22/2018, 12:25 PM

## 2018-09-22 NOTE — Consult Note (Addendum)
Advanced Heart Failure Team Consult Note   Primary Physician: Chesley Noon, MD PCP-Cardiologist:  [name], MD  HF Cardiologist: Aundra Dubin  Reason for Consultation: HF  HPI:    Ryan [name] is seen today for evaluation of CHF/pulmonary hypertension at the request of Dr. Reesa Chew   85 yo with history of interstitial lung disease (?NSIP vs UIP) on home oxygen, pulmonary hypertension, diastolic CHF, and prior non-Hodgkins lymphoma (pelvic mass, s/p XRT). Recently admitted for AKI due to overdiuresis. Renal function improved with holding diuretics and IV hydration. Discharged on 09/12/18 back to [facility] on torsemide 60 bid (was on 100 bid PTA) and spiro 12.5.  Metolazone stopped. Creatinine 1.47. Weight on d/c 206   Admitted on 8/19 with increasing lethargy and shortness of breath. Per EMS patient was placed on 15 L non-rebreather.In ED patient Was found to have CO2 of 81 on VBG, with some accessory muscle usage, lethargy and placed on BiPAP.  X-ray showed mild atelectasis and stable cardiomegaly with no pleural effusion.  Patient was given IV Lasix 60 mg in ED.  Much more alert this am but says he cannot sleep at night because he is anxious that he is going to stop breathing. Cannot get a full breath. + orthopnea and PND. + LE edema. No CP. Weight 212. BNP 40-> 104   Echo 6/20 EF 60-65% RV normal. Personally reviewed    Review of Systems: [y] = yes, _0  = no   . General: Weight gain Blue.Reese ]; Weight loss _1 ; Anorexia _2 ; Fatigue Blue.Reese ]; Fever _3 ; Chills _4 ; Weakness Blue.Reese ]  . Cardiac: Chest pain/pressure _5 ; Resting SOB [ y]; Exertional SOB [ y]; Orthopnea Blue.Reese ]; Pedal Edema [ y]; Palpitations _6 ; Syncope _7 ; Presyncope _8 ; Paroxysmal nocturnal dyspnea[y ]  . Pulmonary: Cough Blue.Reese ]; Wheezing_9 ; Hemoptysis_10 ; Sputum _11 ; Snoring _12   . GI: Vomiting_13 ; Dysphagia_14 ; Melena_15 ; Hematochezia _16 ; Heartburn_17 ; Abdominal pain _18 ; Constipation _19 ; Diarrhea _20 ; BRBPR _21   .  GU: Hematuria_22 ; Dysuria _23 ; Nocturia_24   . Vascular: Pain in legs with walking _25 ; Pain in feet with lying flat _26 ; Non-healing sores _27 ; Stroke _28 ; TIA _29 ; Slurred speech _30 ;  . Neuro: Headaches[y ]; Vertigo_31 ; Seizures_32 ; Paresthesias_33 ;Blurred vision _34 ; Diplopia _35 ; Vision changes _36   . Ortho/Skin: Arthritis Blue.Reese ]; Joint pain [ y]; Muscle pain [ y]; Joint swelling _37 ; Back Pain Blue.Reese ]; Rash _38   . Psych: Depression_39 ; Anxiety[y ]  . Heme: Bleeding problems _40 ; Clotting disorders _41 ; Anemia _42   . Endocrine: Diabetes _43 ; Thyroid dysfunction_44   Home Medications Prior to Admission medications   Medication Sig Start Date End Date Taking? Authorizing Provider  allopurinol (ZYLOPRIM) 100 MG tablet Take 50 mg by mouth daily. 12/13/17  Yes [provider]  diltiazem (CARDIZEM CD) 360 MG 24 hr capsule Take 1 capsule (360 mg total) by mouth daily. 09/12/18  Yes Little Ishikawa, MD  finasteride (PROSCAR) 5 MG tablet Take 1 tablet (5 mg total) by mouth daily. Patient taking differently: Take 5 mg by mouth at bedtime.  12/25/12  Yes Norins, Heinz Knuckles, MD  FLUoxetine (PROZAC) 10 MG capsule Take 10 mg by mouth daily. 07/06/18  Yes [provider]  nystatin (MYCOSTATIN) 100000 UNIT/ML suspension Take 5 mLs by mouth  4 (four) times daily.   Yes [provider]  omeprazole (PRILOSEC) 20 MG capsule Take 20 mg by mouth daily.   Yes [provider]  potassium chloride 20 MEQ TBCR Take 10 mEq by mouth daily. 09/12/18  Yes Little Ishikawa, MD  pramipexole (MIRAPEX) 0.75 MG tablet Take 0.75 mg by mouth at bedtime.   Yes [provider]  spironolactone (ALDACTONE) 25 MG tablet Take 0.5 tablets (12.5 mg total) by mouth daily. 07/26/18  Yes Dhungel, Nishant, MD  torsemide (DEMADEX) 20 MG tablet Take 3 tablets (60 mg total) by mouth 2 (two) times daily. 09/12/18 10/12/18 Yes Little Ishikawa, MD  traMADol (ULTRAM) 50 MG tablet Take 50 mg by mouth  every 12 (twelve) hours as needed for moderate pain.    Yes [provider]  traZODone (DESYREL) 100 MG tablet Take 100 mg by mouth at bedtime. 12/13/17  Yes [provider]  triazolam (HALCION) 0.25 MG tablet Take 0.25 mg by mouth at bedtime as needed for sleep.  02/25/14  Yes [provider]    Past Medical History: Past Medical History:  Diagnosis Date  . Asthma   . Barrett's esophagus   . Benign prostatic hypertrophy   . Bilateral lower extremity edema   . CHF (congestive heart failure) ([place])   . Chronic fatigue   . COPD (chronic obstructive pulmonary disease) ([place])   . Diverticulosis of colon (without mention of hemorrhage)   . Esophageal reflux   . Gastritis   . Gluten intolerance   . Hiatal hernia 02/10/11   Noted on CT Scan - Moderate Hiatal Hernia  . Hyperlipemia   . Hypertension   . Lymphoma of lymph nodes in pelvis ([place]) 03/03/2011    Large Right Retroperitoneal Mass  . Melanoma ([place])    lymphoma  . Microcytic anemia 04/20/2011   . 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 Osteoarthritis    hands/feet,knees, NECK, BACK  . Periaortic lymphadenopathy 02/16/2011  . Personal history of colonic polyps 2004   hyperplastic Dr. Collene Mares  . Renal cyst 02/10/11    Noted on CT Scan - Bilateral Renal Cysts  . S/P radiation therapy 03/15/11 - 04/09/11   Abdominal/ Pelvic Tumor, 3600 cGy/20 Fractions    Past Surgical History: Past Surgical History:  Procedure Laterality Date  . ARTHROSCOPIC REPAIR ACL     right  . BONE MARROW ASPIRATION  02/25/11   Bone Marrow, Aspirate, Clot, and Bilateral Bx, Right PIC  . CATARACT EXTRACTION, BILATERAL    . EYE SURGERY    . HERNIA REPAIR     LEFT INGUINAL   . INSERTION OF MESH N/A 08/23/2012   Procedure: INSERTION OF MESH;  Surgeon: Madilyn Hook, DO;  Location: [facility];  Service: General;  Laterality: N/A;  . LUMBAR LAMINECTOMY/DECOMPRESSION MICRODISCECTOMY Right  12/31/2013   Procedure: RIGHT L4-5 L5-S1 LAMINECTOMY;  Surgeon: Kristeen Miss, MD;  Location: [facility];  Service: Neurosurgery;  Laterality: Right;  RIGHT L4-5 L5-S1 LAMINECTOMY  . RIGHT HEART CATH N/A 07/25/2018   Procedure: RIGHT HEART CATH;  Surgeon: Larey Dresser, MD;  Location: [facility];  Service: Cardiovascular;  Laterality: N/A;  . ROTATOR CUFF REPAIR     right  . TONSILLECTOMY    . TONSILLECTOMY    . VENTRAL HERNIA REPAIR N/A 08/23/2012   Procedure: HERNIA REPAIR VENTRAL ADULT;  Surgeon: Madilyn Hook, DO;  Location: [facility];  Service: General;  Laterality:  N/A;    Family History: Family History  Problem Relation Age of Onset  . Heart disease Father        MI 67  . Urticaria Father   . Prostate cancer Brother   . Prostate cancer Paternal Uncle   . Prostate cancer Paternal Uncle   . Prostate cancer Paternal Uncle   . Hyperlipidemia Other   . Stroke Other   . Hypertension Other   . ADD / ADHD Other   . Colon cancer Neg Hx   . Allergic rhinitis Neg Hx   . Asthma Neg Hx   . Eczema Neg Hx     Social History: Social History   Socioeconomic History  . Marital status: Married    Spouse name: Not on file  . Number of children: 2  . Years of education: Not on file  . Highest education level: Not on file  Occupational History  . 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 Financial resource strain: Not on file  . Food insecurity    Worry: Not on file    Inability: Not on file  . Transportation needs    Medical: Not on file    Non-medical: Not on file  Tobacco Use  . Smoking status: Former Smoker    Packs/day: 1.00    Years: 35.00    Pack years: 35.00    Types: Pipe, Cigarettes    Quit date: 02/23/1992    Years since quitting: 26.5  . Smokeless tobacco: Never Used  . Tobacco comment: quit 20 years ago  Substance and Sexual Activity  . Alcohol use: Yes    Comment: 2 drinks daily scotch  AND WINE WITH SUPPER  .  Drug use: No  . Sexual activity: Not on file  Lifestyle  . Physical activity    Days per week: Not on file    Minutes per session: Not on file  . Stress: Not on file  Relationships  . Social Herbalist on phone: Not on file    Gets together: Not on file    Attends religious service: Not on file    Active member of club or organization: Not on file    Attends meetings of clubs or organizations: Not on file    Relationship status: Not on file  Other Topics Concern  . Not on file  Social History Narrative   Married - second marriage   He has two children   Retired Horticulturist, commercial Professor   Currently teaches pottery making, 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[facility]   Former Smoker quit 12 years ago- smoked for 35 years   Alcohol use- yes    Allergies:  Allergies  Allergen Reactions  . Pollen Extract-Tree Extract Other (See Comments)    HEADACHES, TIRED , DRAINAGE FROM SINUSES  . Molds & Smuts Other (See Comments)    Also dust mites causes sinus infections, h/a etc.    Objective:    Vital Signs:   Temp:  [97.7 F (36.5 C)-98.9 F (37.2 C)] 98.3 F (36.8 C) (08/21 0749) Pulse Rate:  [53-104] 104 (08/21 0749) Resp:  [16-17] 16 (08/20 2035) BP: (102-122)/(76-84) 110/81 (08/21 0749) SpO2:  [94 %-100 %] 100 % (08/21 0749) Weight:  [96.2 kg] 96.2 kg (08/21 0500)    Weight change: Filed Weights   09/20/18 1240 09/22/18 0500  Weight: 93 kg 96.2 kg    Intake/Output:   Intake/Output Summary (Last 24 hours)  at 09/22/2018 1033 Last data filed at 09/21/2018 2241 Gross per 24 hour  Intake -  Output 1200 ml  Net -1200 ml      Physical Exam    General:  Elderly male. No resp difficulty HEENT: normal Neck: supple. JVP to jaw . Carotids 2+ bilat; no bruits. No lymphadenopathy or thyromegaly appreciated. Cor: PMI nondisplaced. Irregular rate & rhythm. No rubs, gallops or murmurs. Lungs: coarse Abdomen: soft, nontender, nondistended. No hepatosplenomegaly. No bruits or  masses. Good bowel sounds. Extremities: no cyanosis, clubbing, rash, 3+ edema Neuro: alert & orientedx3, cranial nerves grossly intact. moves all 4 extremities w/o difficulty. Affect pleasant   Telemetry   Wandering atrial pacemaker/MAT 70-90s Personally reviewed  EKG    WAP 75 No ST-T wave abnormalities.    Labs   Basic Metabolic Panel: Recent Labs  Lab 09/20/18 1327 09/20/18 1339 09/21/18 0215 09/22/18 0230  NA 135 135 138 139  K 4.5 4.5 4.1 3.9  CL 92*  --  95* 95*  CO2 30  --  32 32  GLUCOSE 229*  --  146* 131*  BUN 36*  --  27* 27*  CREATININE 1.65*  --  1.40* 1.46*  CALCIUM 8.4*  --  8.4* 8.4*  MG  --   --   --  2.0    Liver Function Tests: No results for input(s): AST, ALT, ALKPHOS, BILITOT, PROT, ALBUMIN in the last 168 hours. No results for input(s): LIPASE, AMYLASE in the last 168 hours. No results for input(s): AMMONIA in the last 168 hours.  CBC: Recent Labs  Lab 09/20/18 1327 09/20/18 1339 09/21/18 0215 09/22/18 0230  WBC 6.5  --  7.7 7.0  HGB 13.6 15.0 13.7 13.5  HCT 44.9 44.0 45.7 43.0  MCV 95.3  --  95.0 92.3  PLT 202  --  212 230    Cardiac Enzymes: No results for input(s): CKTOTAL, CKMB, CKMBINDEX, TROPONINI in the last 168 hours.  BNP: BNP (last 3 results) Recent Labs    09/07/18 1241 09/20/18 1730 09/22/18 0230  BNP 40.6 86.2 104.3*    ProBNP (last 3 results) No results for input(s): PROBNP in the last 8760 hours.   CBG: Recent Labs  Lab 09/21/18 0730 09/21/18 1157 09/21/18 1651 09/21/18 2310 09/22/18 0804  GLUCAP 131* 147* 112* 76 170*    Coagulation Studies: No results for input(s): LABPROT, INR in the last 72 hours.   Imaging    No results found.   Medications:     Current Medications: . allopurinol  50 mg Oral Daily  . finasteride  5 mg Oral QHS  . FLUoxetine  10 mg Oral Daily  . furosemide  40 mg Intravenous BID  . glipiZIDE  2.5 mg Oral QAC breakfast  . heparin  5,000 Units Subcutaneous Q8H   . insulin aspart  0-9 Units Subcutaneous TID WC  . pantoprazole  40 mg Oral Daily  . pramipexole  0.75 mg Oral QHS  . spironolactone  12.5 mg Oral Daily     Infusions:    Assessment/Plan   1. Acute on chronic diastolic CHF:  Echo with EF 60-65%, RV reportedly not markedly abnormal but PASP 59 mmHg.  On exam today, he still looks volume overloaded though he has had reasonable diuresis so far in hospital.  Still with orthopnea and PND - Responding well to lasix 40 IV bid but still with significant volume overload. Add metolazone x 1 - Continue spironolactone 12.5 mg daily.  - Supp K -  Place TED hose 2. CKD: Stage 3.  Creatinine stable with diuresis so far. Watch very closely given recent AKI 3. Pulmonary hypertnesion: Patient thought to have [place] out of proportion to his lung disease (ILD, NSIP versus UIP).  Last RHC in 6/20 showed mean PA pressure 34, PCWP 12 PVR 5.3 WU, CI 2.0 . Patient did not tolerate Adcirca.  - Currently not on any selective pulmonary vasodialtors - Per Dr. Aundra Dubin, can consider ambrisentan in future based on RHC findings (was planned by his [facility] physician).  4. H/o MAT/PVCs: Follow on telemetry.  - Cardizem 360 on hold. Would restart at 180 daily  5. DM2 - Consider SGLT2i if renal function stable  Length of Stay: 2  Glori Bickers, MD  09/22/2018, 10:33 AM  Advanced Heart Failure Team Pager [phone] (M-F; 7a - 4p)  Please contact [facility] for night-coverage after hours (4p -7a ) and weekends on [email or web address]

## 2018-09-22 NOTE — Progress Notes (Signed)
PROGRESS NOTE    Ryan [name]  NOM:[id number] DOB: 12-Feb-1931 DOA: 09/20/2018 PCP: Chesley Noon, MD   Brief Narrative:  85 year old male with history of non-Hodgkin's lymphoma status post radiation, essential hypertension, chronic hypoxic respiratory failure on 2 L nasal cannula, COPD/pulmonary fibrosis, hyperlipidemia came to the hospital with complaints of increasing lethargy and shortness of breath.  Recently his diuretics for reduced due to clinical signs of dehydration.  Upon admission he was noted to be in hypercarbic respiratory failure requiring BiPAP.  Receiving aggressive diuresis during the hospitalization.   Assessment & Plan:   Active Problems:   BPH (benign prostatic hyperplasia)   RLS (restless legs syndrome)   Lumbar radiculopathy   Asthma/COPD   Chronic diastolic CHF (congestive heart failure) (HCC)   Pulmonary hypertension (HCC)   Acute respiratory failure with hypoxia and hypercarbia (HCC)   CKD (chronic kidney disease) stage 3, GFR 30-59 ml/min (HCC)   Lethargy   Acute metabolic encephalopathy   CHF (congestive heart failure) (HCC)   Acute hypercarbic respiratory failure Acute on chronic hypoxia, currently on 4 L nasal cannula.  On 2 L at home Acute on chronic diastolic congestive heart failure with preserved ejection fraction 60 to 65%, class III -Continue low-salt diet, fluid restriction - Lasix 40 mg IV twice daily, monitor electrolytes.  1 dose of Zaroxolyn -Hold AVSS volume status. -Last echocardiogram 6/20-showed ejection fraction 60 to 65%. -Wean off oxygen. -Aldactone 12.5 mg orally daily  Acute metabolic encephalopathy, resolved -This is secondary to CO2 narcosis.  This is resolved.  History of multifocal atrial tachycardia/PVCs - Resume Cardizem 180 mg daily  Mild pulmonary hypertension -This is combination of underlying cardiac disease and also his pulmonary fibrosis.  Diabetes mellitus type 2 -Not on any home medication.  Previous  visit showed hemoglobin A1c 8.6.  Continue insulin sliding scale and Accu-Chek -We will consult diabetic coordinator, may need to start him on oral diabetic regimen.  Unless the patient prefers strict diet  Chronic kidney disease stage III -Creatinine around baseline of 1.8.  Today's 1.4  History of BPH -Continue Proscar  Chronic low back pain -Pain control with Tylenol  History of depression, stable -Daily Prozac  History of gout, stable -Allopurinol  PT/OT-ordered  DVT prophylaxis: Subcutaneous heparin Code Status: DNR Family Communication: None at bedside Disposition Plan: Maintain hospital stay for aggressive IV diuretics  Consultants:   CHF team  Procedures:   None  Antimicrobials:   None   Subjective: Feels better, tells me he did not get much rest last night.  He says he is fearful of falling asleep due to anxiety and thinks will not wake up.  Review of Systems Otherwise negative except as per HPI, including: General = no fevers, chills, dizziness, malaise, fatigue HEENT/EYES = negative for pain, redness, loss of vision, double vision, blurred vision, loss of hearing, sore throat, hoarseness, dysphagia Cardiovascular= negative for chest pain, palpitation, murmurs, lower extremity swelling Respiratory/lungs= negative for shortness of breath, cough, hemoptysis, wheezing, mucus production Gastrointestinal= negative for nausea, vomiting,, abdominal pain, melena, hematemesis Genitourinary= negative for Dysuria, Hematuria, Change in Urinary Frequency MSK = Negative for arthralgia, myalgias, Back Pain, Joint swelling  Neurology= Negative for headache, seizures, numbness, tingling  Psychiatry= Negative for anxiety, depression, suicidal and homocidal ideation Allergy/Immunology= Medication/Food allergy as listed  Skin= Negative for Rash, lesions, ulcers, itching   Objective: Vitals:   09/22/18 0015 09/22/18 0433 09/22/18 0500 09/22/18 0749  BP: 110/78 117/78   110/81  Pulse: 74 90  (!)  104  Resp:    18  Temp: 98.3 F (36.8 C) 97.7 F (36.5 C)  98.3 F (36.8 C)  TempSrc: Oral Oral  Oral  SpO2: 98% 98%  100%  Weight:   96.2 kg   Height:        Intake/Output Summary (Last 24 hours) at 09/22/2018 1156 Last data filed at 09/22/2018 1032 Gross per 24 hour  Intake 120 ml  Output 2000 ml  Net -1880 ml   Filed Weights   09/20/18 1240 09/22/18 0500  Weight: 93 kg 96.2 kg    Examination:  Constitutional: NAD, calm, comfortable, 3 L nasal cannula Eyes: PERRL, lids and conjunctivae normal ENMT: Mucous membranes are moist. Posterior pharynx clear of any exudate or lesions.Normal dentition.  Neck: normal, supple, no masses, no thyromegaly Respiratory: Bibasilar crackles Cardiovascular: Regular rate and rhythm, no murmurs / rubs / gallops.  3+ lower extremity pitting edema. 2+ pedal pulses. No carotid bruits.  Abdomen: no tenderness, no masses palpated. No hepatosplenomegaly. Bowel sounds positive.  Musculoskeletal: no clubbing / cyanosis. No joint deformity upper and lower extremities. Good ROM, no contractures. Normal muscle tone.  Skin: no rashes, lesions, ulcers. No induration Neurologic: CN 2-12 grossly intact. Sensation intact, DTR normal. Strength 5/5 in all 4.  Psychiatric: Normal judgment and insight. Alert and oriented x 3. Normal mood.     Data Reviewed:   CBC: Recent Labs  Lab 09/20/18 1327 09/20/18 1339 09/21/18 0215 09/22/18 0230  WBC 6.5  --  7.7 7.0  HGB 13.6 15.0 13.7 13.5  HCT 44.9 44.0 45.7 43.0  MCV 95.3  --  95.0 92.3  PLT 202  --  212 384   Basic Metabolic Panel: Recent Labs  Lab 09/20/18 1327 09/20/18 1339 09/21/18 0215 09/22/18 0230  NA 135 135 138 139  K 4.5 4.5 4.1 3.9  CL 92*  --  95* 95*  CO2 30  --  32 32  GLUCOSE 229*  --  146* 131*  BUN 36*  --  27* 27*  CREATININE 1.65*  --  1.40* 1.46*  CALCIUM 8.4*  --  8.4* 8.4*  MG  --   --   --  2.0   GFR: Estimated Creatinine Clearance: 44.6  mL/min (A) (by C-G formula based on SCr of 1.46 mg/dL (H)). Liver Function Tests: No results for input(s): AST, ALT, ALKPHOS, BILITOT, PROT, ALBUMIN in the last 168 hours. No results for input(s): LIPASE, AMYLASE in the last 168 hours. No results for input(s): AMMONIA in the last 168 hours. Coagulation Profile: No results for input(s): INR, PROTIME in the last 168 hours. Cardiac Enzymes: No results for input(s): CKTOTAL, CKMB, CKMBINDEX, TROPONINI in the last 168 hours. BNP (last 3 results) No results for input(s): PROBNP in the last 8760 hours. HbA1C: No results for input(s): HGBA1C in the last 72 hours. CBG: Recent Labs  Lab 09/21/18 1157 09/21/18 1651 09/21/18 2310 09/22/18 0804 09/22/18 1153  GLUCAP 147* 112* 76 170* 120*   Lipid Profile: No results for input(s): CHOL, HDL, LDLCALC, TRIG, CHOLHDL, LDLDIRECT in the last 72 hours. Thyroid Function Tests: No results for input(s): TSH, T4TOTAL, FREET4, T3FREE, THYROIDAB in the last 72 hours. Anemia Panel: No results for input(s): VITAMINB12, FOLATE, FERRITIN, TIBC, IRON, RETICCTPCT in the last 72 hours. Sepsis Labs: No results for input(s): PROCALCITON, LATICACIDVEN in the last 168 hours.  Recent Results (from the past 240 hour(s))  SARS Coronavirus 2 [facility] order, Performed in [facility] hospital lab) Nasopharyngeal Nasopharyngeal Swab  Status: None   Collection Time: 09/20/18  2:32 PM   Specimen: Nasopharyngeal Swab  Result Value Ref Range Status   SARS Coronavirus 2 NEGATIVE NEGATIVE Final    Comment: (NOTE) If result is NEGATIVE SARS-CoV-2 target nucleic acids are NOT DETECTED. The SARS-CoV-2 RNA is generally detectable in upper and lower  respiratory specimens during the acute phase of infection. The lowest  concentration of SARS-CoV-2 viral copies this assay can detect is 250  copies / mL. A negative result does not preclude SARS-CoV-2 infection  and should not be used as the sole basis for treatment or other   patient management decisions.  A negative result may occur with  improper specimen collection / handling, submission of specimen other  than nasopharyngeal swab, presence of viral mutation(s) within the  areas targeted by this assay, and inadequate number of viral copies  (<250 copies / mL). A negative result must be combined with clinical  observations, patient history, and epidemiological information. If result is POSITIVE SARS-CoV-2 target nucleic acids are DETECTED. The SARS-CoV-2 RNA is generally detectable in upper and lower  respiratory specimens dur ing the acute phase of infection.  Positive  results are indicative of active infection with SARS-CoV-2.  Clinical  correlation with patient history and other diagnostic information is  necessary to determine patient infection status.  Positive results do  not rule out bacterial infection or co-infection with other viruses. If result is PRESUMPTIVE POSTIVE SARS-CoV-2 nucleic acids MAY BE PRESENT.   A presumptive positive result was obtained on the submitted specimen  and confirmed on repeat testing.  While 2019 novel coronavirus  (SARS-CoV-2) nucleic acids may be present in the submitted sample  additional confirmatory testing may be necessary for epidemiological  and / or clinical management purposes  to differentiate between  SARS-CoV-2 and other Sarbecovirus currently known to infect humans.  If clinically indicated additional testing with an alternate test  methodology [phone]) is advised. The SARS-CoV-2 RNA is generally  detectable in upper and lower respiratory sp ecimens during the acute  phase of infection. The expected result is Negative. Fact Sheet for Patients:  StrictlyIdeas.no Fact Sheet for Healthcare Providers: BankingDealers.co.za This test is not yet approved or cleared by the [place] FDA and has been authorized for detection and/or diagnosis of SARS-CoV-2 by  FDA under an Emergency Use Authorization (EUA).  This EUA will remain in effect (meaning this test can be used) for the duration of the COVID-19 declaration under Section 564(b)(1) of the Act, 21 U.S.C. section 360bbb-3(b)(1), unless the authorization is terminated or revoked sooner. Performed at [facility], [place] [address] [place],  [address]          Radiology Studies: Dg Chest Portable 1 View  Result Date: 09/20/2018 CLINICAL DATA:  Shortness of breath. EXAM: PORTABLE CHEST 1 VIEW COMPARISON:  Radiographs of September 09, 2018. FINDINGS: Stable cardiomegaly. No pneumothorax is noted. Stable elevated left hemidiaphragm. Mild bibasilar subsegmental atelectasis is noted. No significant pleural effusion is noted. Bony thorax is unremarkable. IMPRESSION: Stable elevated left hemidiaphragm. Mild bibasilar subsegmental atelectasis. Electronically Signed   By: Marijo Conception M.D.   On: 09/20/2018 13:20        Scheduled Meds: . allopurinol  50 mg Oral Daily  . diltiazem  180 mg Oral Daily  . finasteride  5 mg Oral QHS  . FLUoxetine  10 mg Oral Daily  . furosemide  40 mg Intravenous BID  . glipiZIDE  2.5 mg Oral QAC breakfast  .  heparin  5,000 Units Subcutaneous Q8H  . insulin aspart  0-9 Units Subcutaneous TID WC  . metolazone  2.5 mg Oral Once  . pantoprazole  40 mg Oral Daily  . potassium chloride  40 mEq Oral Once  . pramipexole  0.75 mg Oral QHS  . spironolactone  12.5 mg Oral Daily   Continuous Infusions:   LOS: 2 days   Time spent= 35 mins    Ankit Arsenio Loader, MD [facility]  If 7PM-7AM, please contact night-coverage [email or web address] 09/22/2018, 11:56 AM

## 2018-09-22 NOTE — TOC Initial Note (Addendum)
Transition of Care [facility]) - Initial/Assessment Note    Patient Details  Name: Ryan [name] MRN: [id number] Date of Birth: 12/12/1931  Transition of Care [facility]) CM/SW Contact:    Sharin Mons, RN Phone Number: 09/22/2018, 8:24 PM  Clinical Narrative:         Readmitted with increasing lethargy, SOB. Multi admits. Last admit note 8/6-8/11, Acute kidney injury superimposed on CKD. From home with alone.  .Pt lives @ [facility].Supportive wife. PTA pt received PT/OT by [facility]. DME: [employer] / [employer].   [name] (Spouse)     [phone]      NCM spoke with pt and wife @ bedside regarding disposition needs. Both agreeable to outpatient palliative care once d/c. Pt/wife would like to continue home health services provided via [facility]. Wife would like for pt to d/c with CPAP and hospital bed. Wife states pt has had an unsuccessful study in the past. NCM informed wife in order for pt to d/c with    CPAP pt's PCP will need to make referral for sleep study, and a Letter of Guarantee will need to be signed by pt/wife assuming responsibility for CPAP cost if insurance fails to pay once sleep study completed or not completed. Wife stated she's willing to pay the cost of the CPAP if needed. Wife also would like a hospital bed delivered to pt's home prior to d/c. [facility] team to f/u with obtaining order and making referral ....  Expected Discharge Plan: [place] Barriers to Discharge: Continued Medical Work up   Patient Goals and CMS Choice Patient states their goals for this hospitalization and ongoing recovery are:: return to home with palliative care CMS Medicare.gov Compare Post Acute Care list provided to:: Patient Choice offered to / list presented to : Patient  Expected Discharge Plan and Services Expected Discharge Plan: [place]   Discharge Planning Services: CM Consult Post Acute Care Choice: [place] arrangements  for the past 2 months: [place]: PT, OT(Active with [facility])          Prior Living Arrangements/Services Living arrangements for the past 2 months: [place] Lives with:: Spouse   Do you feel safe going back to the place where you live?: Yes          Current home services: Home OT, Home PT    Activities of Daily Living Home Assistive Devices/Equipment: Eyeglasses, Oxygen, Blood pressure cuff, Walker (specify type), Wheelchair ADL Screening (condition at time of admission) Patient's cognitive ability adequate to safely complete daily activities?: Yes Is the patient deaf or have difficulty hearing?: Yes Does the patient have difficulty seeing, even when wearing glasses/contacts?: Yes Does the patient have difficulty concentrating, remembering, or making decisions?: Yes Patient able to express need for assistance with ADLs?: Yes Does the patient have difficulty dressing or bathing?: Yes Independently performs ADLs?: No Communication: Independent Dressing (OT): Needs assistance Is this a change from baseline?: Pre-admission baseline Grooming: Needs assistance Is this a change from baseline?: Pre-admission baseline Feeding: Independent Bathing: Needs assistance Is this a change from baseline?: Pre-admission baseline Toileting: Needs assistance Is this a change from baseline?: Pre-admission baseline In/Out Bed: Needs assistance Is this a change from baseline?: Pre-admission baseline Walks in Home: Needs assistance Is this  a change from baseline?: Pre-admission baseline Does the patient have difficulty walking or climbing stairs?: Yes Weakness of Legs: Both Weakness of Arms/Hands: None  Permission Sought/Granted                  Emotional Assessment              Admission diagnosis:  SOB AND ALTERED Patient Active Problem List   Diagnosis Date Noted  . Acute  respiratory failure with hypoxia and hypercarbia ([place]) 09/20/2018  . CKD (chronic kidney disease) stage 3, GFR 30-59 ml/min (HCC) 09/20/2018  . Lethargy 09/20/2018  . Acute metabolic encephalopathy 54/10/8117  . CHF (congestive heart failure) ([place]) 09/20/2018  . Acute kidney injury superimposed on CKD ([place]) 09/10/2018  . Pulmonary hypertension ([place]) 09/10/2018  . Multifocal atrial tachycardia ([place]) 09/10/2018  . CHF exacerbation ([place]) 09/07/2018  . Leg pain 09/07/2018  . Hyperkalemia 07/26/2018  . Chronic diastolic CHF (congestive heart failure) ([place])   . Bilateral lower extremity edema 02/02/2017  . Pulmonary fibrosis ([place]) 11/22/2016  . Blurry vision, bilateral 10/26/2016  . COPD ([place]) 07/05/2016  . Exertional dyspnea 09/10/2015  . Fatigue/malaise 08/19/2015  . Asthma/COPD 02/12/2015  . 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 Hypoxemia 01/05/2014  . Essential hypertension 01/05/2014  . Hypokalemia 01/05/2014  . Urinary retention 01/05/2014  . Hypoxia   . Lumbar radiculopathy 12/31/2013  . RLS (restless legs syndrome) 09/21/2012  . Chronic lower back pain 06/15/2012  . Abdominal pain, unspecified site 05/19/2012  . Unspecified deficiency anemia 04/07/2012  . Melanoma ([place])   . Non Hodgkin's lymphoma ([place])   . Periaortic lymphadenopathy 02/16/2011  . Barrett's esophagus 07/09/2010  . Personal history of colonic polyps 05/28/2010  . History of IBS 05/28/2010  . Diverticulosis of colon (without mention of hemorrhage) 05/28/2010  . Arthritis involving multiple sites 05/28/2010  . Wheat intolerance 05/28/2010  . Chronic venous insufficiency 08/19/2009  . PEDAL EDEMA 08/19/2009  . INSOMNIA, CHRONIC 12/24/2008  . EUSTACHIAN TUBE DYSFUNCTION, BILATERAL 12/23/2008  . ERECTILE DYSFUNCTION, ORGANIC 08/01/2008  . Allergic rhinitis with a nonallergic component 05/20/2008  . VENTRAL HERNIA 02/12/2008  . PALPITATIONS 12/25/2007  . LACTOSE INTOLERANCE 10/17/2007  .  Hyperlipidemia 09/14/2006  . BPH (benign prostatic hyperplasia) 09/14/2006  . OSTEOARTHRITIS 09/14/2006  . HEART MURMUR, HX OF 09/14/2006   PCP:  Chesley Noon, MD Pharmacy:   [facility], [place] [address] Phone: [phone] Fax: [phone]  [facility] [place] [facility] [place] - [address] [place] [address] Phone: [phone] Fax: [phone]     Social Determinants of Health (SDOH) Interventions    Readmission Risk Interventions Readmission Risk Prevention Plan 09/12/2018 07/26/2018  Transportation Screening Complete Complete  PCP or Specialist Appt within 3-5 Days Complete Complete  HRI or Home Care Consult Complete Complete  Social Work Consult for [place] Planning/Counseling Complete Complete  Palliative Care Screening Not Applicable Not Applicable  Medication Review Press photographer) Complete Complete  Some recent data might be hidden

## 2018-09-22 NOTE — Evaluation (Signed)
Occupational Therapy Evaluation Patient Details Name: Ryan [name] MRN: [id number] DOB: May 12, 1931 Today's Date: 09/22/2018    History of Present Illness 85 yo with history of interstitial lung disease (?NSIP vs UIP) on home oxygen, pulmonary hypertension, diastolic CHF, and prior non-Hodgkins lymphoma (pelvic mass, s/p XRT).  Pt admitted with increasing lethargy and shortness of breath.   Clinical Impression   Pt with decline I function and safety with ADLs and ADL mobility. Pt lives at an ALF/ILF and has PCA for min A bathing, dressing and meals. Pt currently requires mod A with LB ADLs and min - min guard A with mobility using RW. Pt required some encouragement to ambulate as he reports weakness and fatigue in his LEs, but was able to ambulate with RW around bed with O2 SATs >  90%.  Per chart, palliative is meeting with pt.  At this time, recommend [facility] with 24/7 sup. Pt would benefit from acute OT services to address impairments to maximize level of function and safety    Follow Up Recommendations  Supervision/Assistance - 24 hour;Home health OT    Equipment Recommendations  3 in 1 bedside commode    Recommendations for Other Services       Precautions / Restrictions Precautions Precautions: Fall Precaution Comments: Reports frequent falls in the bathroom. Restrictions Weight Bearing Restrictions: No      Mobility Bed Mobility               General bed mobility comments: sitting up on EOB upon arrival  Transfers Overall transfer level: Needs assistance Equipment used: Rolling walker (2 wheeled) Transfers: Sit to/from Stand Sit to Stand: Min assist;Min guard         General transfer comment: MIN A to for initial push and then min/guard    Balance Overall balance assessment: Needs assistance Sitting-balance support: Feet supported;No upper extremity supported Sitting balance-Leahy Scale: Good     Standing balance support: Bilateral upper extremity  supported Standing balance-Leahy Scale: Poor Standing balance comment: requires UE support. C/o leg fatigue in standing.                           ADL either performed or assessed with clinical judgement   ADL Overall ADL's : Needs assistance/impaired Eating/Feeding: Independent;Sitting   Grooming: Wash/dry face;Wash/dry hands;Min guard;Standing   Upper Body Bathing: Supervision/ safety;Set up;Sitting   Lower Body Bathing: Moderate assistance   Upper Body Dressing : Supervision/safety;Set up;Sitting   Lower Body Dressing: Moderate assistance   Toilet Transfer: Minimal assistance;Min guard;+2 for safety/equipment;Ambulation;RW   Toileting- Clothing Manipulation and Hygiene: Minimal assistance;Sit to/from stand   Tub/ Shower Transfer: Minimal assistance;Min guard;Ambulation;Rolling walker;Grab bars;3 in 1   Functional mobility during ADLs: Minimal assistance;Min guard;Rolling walker       Vision Baseline Vision/History: Macular Degeneration Patient Visual Report: No change from baseline       Perception     Praxis      Pertinent Vitals/Pain Pain Assessment: No/denies pain Pain Intervention(s): Limited activity within patient's tolerance;Monitored during session     Hand Dominance Right   Extremity/Trunk Assessment Upper Extremity Assessment Upper Extremity Assessment: Defer to OT evaluation   Lower Extremity Assessment Lower Extremity Assessment: Defer to PT evaluation   Cervical / Trunk Assessment Cervical / Trunk Assessment: Normal   Communication Communication Communication: No difficulties   Cognition Arousal/Alertness: Awake/alert Behavior During Therapy: WFL for tasks assessed/performed Overall Cognitive Status: No family/caregiver present to determine baseline cognitive functioning  Memory: Decreased short-term memory Following Commands: Follows one step commands with increased time;Follows multi-step  commands with increased time     Problem Solving: Slow processing     General Comments       Exercises     Shoulder Instructions      Home Living Family/patient expects to be discharged to:: Assisted living Living Arrangements: Alone Available Help at Discharge: Family;Available PRN/intermittently;Personal care attendant Type of Home: Independent living facility                       Home Equipment: Walker - 4 wheels;Wheelchair - manual          Prior Functioning/Environment Level of Independence: Needs assistance  Gait / Transfers Assistance Needed: Ambulating in apartment with rollator and using w/c for longer distancex. ADL's / Homemaking Assistance Needed: assist with dressing, bathing, breakfast and meds. 7d/wk for dressing and breakfast, 3d/wk for showers, and 2x/day for meds. Sleeps in chair at home   Comments: States he is getting a lift chair        OT Problem List: Decreased strength;Decreased range of motion;Impaired balance (sitting and/or standing);Pain;Decreased activity tolerance      OT Treatment/Interventions: Self-care/ADL training;DME and/or AE instruction;Therapeutic activities;Patient/family education    OT Goals(Current goals can be found in the care plan section) Acute Rehab OT Goals Patient Stated Goal: go home OT Goal Formulation: With patient Time For Goal Achievement: 09/29/18 Potential to Achieve Goals: Good ADL Goals Pt Will Perform Grooming: with supervision;with set-up;standing Pt Will Perform Upper Body Bathing: with set-up;sitting Pt Will Perform Lower Body Bathing: with min assist Pt Will Perform Upper Body Dressing: with set-up;sitting Pt Will Perform Lower Body Dressing: with min assist Pt Will Transfer to Toilet: with min guard assist;with supervision;ambulating Pt Will Perform Toileting - Clothing Manipulation and hygiene: with min guard assist;sit to/from stand Pt Will Perform Tub/Shower Transfer: with min guard  assist;ambulating;shower seat;grab bars;rolling walker  OT Frequency: Min 2X/week   Barriers to D/C:            Co-evaluation PT/OT/SLP Co-Evaluation/Treatment: Yes Reason for Co-Treatment: Complexity of the patient's impairments (multi-system involvement);For patient/therapist safety PT goals addressed during session: Mobility/safety with mobility;Balance OT goals addressed during session: ADL's and self-care      AM-PAC OT "6 Clicks" Daily Activity     Outcome Measure Help from another person eating meals?: None Help from another person taking care of personal grooming?: A Little Help from another person toileting, which includes using toliet, bedpan, or urinal?: A Lot Help from another person bathing (including washing, rinsing, drying)?: A Lot Help from another person to put on and taking off regular upper body clothing?: A Little Help from another person to put on and taking off regular lower body clothing?: A Lot 6 Click Score: 16   End of Session Equipment Utilized During Treatment: Gait belt;Rolling walker;Oxygen  Activity Tolerance: Patient tolerated treatment well Patient left: in chair;with call bell/phone within reach;with chair alarm set  OT Visit Diagnosis: Unsteadiness on feet (R26.81);Other abnormalities of gait and mobility (R26.89);Repeated falls (R29.6);History of falling (Z91.81)                Time: 4782-9562 OT Time Calculation (min): 27 min Charges:  OT General Charges $OT Visit: 1 Visit OT Evaluation $OT Eval Moderate Complexity: 1 Mod    Britt Bottom 09/22/2018, 1:47 PM

## 2018-09-22 NOTE — Progress Notes (Signed)
Upon ambulation, patient will go into atrial fibrillation with RVR in 140's-150's. After sitting back down, patient will be sinus tachycardia or a fib in 110's. Dr. Reesa Chew paged to make aware.  Hiram Comber, RN 09/22/2018 10:50 AM

## 2018-09-22 NOTE — Evaluation (Deleted)
Occupational Therapy Evaluation Patient Details Name: Ryan [name] MRN: [id number] DOB: [birth year] Today's Date: 09/22/2018    History of Present Illness 85 yo with history of interstitial lung disease (?NSIP vs UIP) on home oxygen, pulmonary hypertension, diastolic CHF, and prior non-Hodgkins lymphoma (pelvic mass, s/p XRT).  Pt admitted with increasing lethargy and shortness of breath.   Clinical Impression   Pt with decline in function and safety with ADLs and ADL mobility with impaired strength, balance and endurance. Pt lives at an ALF/ILF and hs a PCA for min A with ADLs and meals. Pt uses at [place] at home. Pt currently requires mod A with LB ADLs and min A - min guard A with mobility using RW. Pt required some encouragement to ambulate as he c/o weakness and fatigue in his LEs, but was able to ambulate with RW around bed with O2 SATs> 90%.  Per chart, palliative is meeting with pt. Pt would benefit from acute OT services to maximize level of function and safety    Follow Up Recommendations  Supervision/Assistance - 24 hour;Home health OT    Equipment Recommendations  3 in 1 bedside commode    Recommendations for Other Services       Precautions / Restrictions Precautions Precautions: Fall Precaution Comments: Reports frequent falls in the bathroom. Restrictions Weight Bearing Restrictions: No      Mobility Bed Mobility               General bed mobility comments: sitting up on EOB upon arrival  Transfers Overall transfer level: Needs assistance Equipment used: Rolling walker (2 wheeled) Transfers: Sit to/from Stand Sit to Stand: Min assist;Min guard         General transfer comment: MIN A to for initial push and then min/guard    Balance Overall balance assessment: Needs assistance Sitting-balance support: Feet supported;No upper extremity supported Sitting balance-Leahy Scale: Good     Standing balance support: Bilateral upper extremity  supported Standing balance-Leahy Scale: Poor Standing balance comment: requires UE support. C/o leg fatigue in standing.                           ADL either performed or assessed with clinical judgement   ADL Overall ADL's : Needs assistance/impaired Eating/Feeding: Independent;Sitting   Grooming: Wash/dry face;Wash/dry hands;Min guard;Standing   Upper Body Bathing: Supervision/ safety;Set up;Sitting   Lower Body Bathing: Moderate assistance   Upper Body Dressing : Supervision/safety;Set up;Sitting   Lower Body Dressing: Moderate assistance   Toilet Transfer: Minimal assistance;Min guard;+2 for safety/equipment;Ambulation;RW   Toileting- Clothing Manipulation and Hygiene: Minimal assistance;Sit to/from stand   Tub/ Shower Transfer: Minimal assistance;Min guard;Ambulation;Rolling walker;Grab bars;3 in 1   Functional mobility during ADLs: Minimal assistance;Min guard;Rolling walker       Vision Baseline Vision/History: Macular Degeneration Patient Visual Report: No change from baseline       Perception     Praxis      Pertinent Vitals/Pain Pain Assessment: No/denies pain Pain Intervention(s): Limited activity within patient's tolerance;Monitored during session     Hand Dominance Right   Extremity/Trunk Assessment Upper Extremity Assessment Upper Extremity Assessment: Defer to OT evaluation   Lower Extremity Assessment Lower Extremity Assessment: Defer to PT evaluation   Cervical / Trunk Assessment Cervical / Trunk Assessment: Normal   Communication Communication Communication: No difficulties   Cognition Arousal/Alertness: Awake/alert Behavior During Therapy: WFL for tasks assessed/performed Overall Cognitive Status: No family/caregiver present to determine baseline cognitive functioning  Memory: Decreased short-term memory Following Commands: Follows one step commands with increased time;Follows multi-step  commands with increased time     Problem Solving: Slow processing     General Comments       Exercises     Shoulder Instructions      Home Living Family/patient expects to be discharged to:: Assisted living Living Arrangements: Alone Available Help at Discharge: Family;Available PRN/intermittently;Personal care attendant Type of Home: Independent living facility                       Home Equipment: Walker - 4 wheels;Wheelchair - manual          Prior Functioning/Environment Level of Independence: Needs assistance  Gait / Transfers Assistance Needed: Ambulating in apartment with rollator and using w/c for longer distancex. ADL's / Homemaking Assistance Needed: assist with dressing, bathing, breakfast and meds. 7d/wk for dressing and breakfast, 3d/wk for showers, and 2x/day for meds. Sleeps in chair at home   Comments: States he is getting a lift chair        OT Problem List: Decreased strength;Decreased range of motion;Impaired balance (sitting and/or standing);Pain;Decreased activity tolerance      OT Treatment/Interventions: Self-care/ADL training;DME and/or AE instruction;Therapeutic activities;Patient/family education    OT Goals(Current goals can be found in the care plan section) Acute Rehab OT Goals Patient Stated Goal: go home OT Goal Formulation: With patient Time For Goal Achievement: 09/29/18 Potential to Achieve Goals: Good ADL Goals Pt Will Perform Grooming: with supervision;with set-up;standing Pt Will Perform Upper Body Bathing: with set-up;sitting Pt Will Perform Lower Body Bathing: with min assist Pt Will Perform Upper Body Dressing: with set-up;sitting Pt Will Perform Lower Body Dressing: with min assist Pt Will Transfer to Toilet: with min guard assist;with supervision;ambulating Pt Will Perform Toileting - Clothing Manipulation and hygiene: with min guard assist;sit to/from stand Pt Will Perform Tub/Shower Transfer: with min guard  assist;ambulating;shower seat;grab bars;rolling walker  OT Frequency: Min 2X/week   Barriers to D/C:            Co-evaluation PT/OT/SLP Co-Evaluation/Treatment: Yes Reason for Co-Treatment: Complexity of the patient's impairments (multi-system involvement);For patient/therapist safety PT goals addressed during session: Mobility/safety with mobility;Balance OT goals addressed during session: ADL's and self-care      AM-PAC OT "6 Clicks" Daily Activity     Outcome Measure Help from another person eating meals?: None Help from another person taking care of personal grooming?: A Little Help from another person toileting, which includes using toliet, bedpan, or urinal?: A Lot Help from another person bathing (including washing, rinsing, drying)?: A Lot Help from another person to put on and taking off regular upper body clothing?: A Little Help from another person to put on and taking off regular lower body clothing?: A Lot 6 Click Score: 16   End of Session Equipment Utilized During Treatment: Gait belt;Rolling walker;Oxygen  Activity Tolerance: Patient tolerated treatment well Patient left: in chair;with call bell/phone within reach;with chair alarm set  OT Visit Diagnosis: Unsteadiness on feet (R26.81);Other abnormalities of gait and mobility (R26.89);Repeated falls (R29.6);History of falling (Z91.81)                Time: 4076-8088 OT Time Calculation (min): 27 min Charges:  OT General Charges $OT Visit: 1 Visit OT Evaluation $OT Eval Moderate Complexity: 1 Mod    Britt Bottom 09/22/2018, 1:51 PM

## 2018-09-23 ENCOUNTER — Inpatient Hospital Stay (HOSPITAL_COMMUNITY): Payer: Medicare HMO

## 2018-09-23 DIAGNOSIS — I471 Supraventricular tachycardia: Secondary | ICD-10-CM

## 2018-09-23 LAB — BASIC METABOLIC PANEL
Anion gap: 14 (ref 5–15)
BUN: 22 mg/dL (ref 8–23)
CO2: 30 mmol/L (ref 22–32)
Calcium: 8.4 mg/dL — ABNORMAL LOW (ref 8.9–10.3)
Chloride: 94 mmol/L — ABNORMAL LOW (ref 98–111)
Creatinine, Ser: 1.26 mg/dL — ABNORMAL HIGH (ref 0.61–1.24)
GFR calc Af Amer: >60 mL/min (ref >60)
GFR calc non Af Amer: 52 mL/min — ABNORMAL LOW (ref >60)
Glucose, Bld: 140 mg/dL — ABNORMAL HIGH (ref 70–99)
Potassium: 3.3 mmol/L — ABNORMAL LOW (ref 3.5–5.1)
Sodium: 138 mmol/L (ref 135–145)

## 2018-09-23 LAB — CBC
HCT: 42.8 % (ref 39.0–52.0)
Hemoglobin: 13.7 g/dL (ref 13.0–17.0)
MCH: 29.2 pg (ref 26.0–34.0)
MCHC: 32 g/dL (ref 30.0–36.0)
MCV: 91.3 fL (ref 80.0–100.0)
Platelets: 248 10*3/uL (ref 150–400)
RBC: 4.69 MIL/uL (ref 4.22–5.81)
RDW: 16.1 % — ABNORMAL HIGH (ref 11.5–15.5)
WBC: 6.1 10*3/uL (ref 4.0–10.5)
nRBC: 0 % (ref 0.0–0.2)

## 2018-09-23 LAB — GLUCOSE, CAPILLARY
Glucose-Capillary: 169 mg/dL — ABNORMAL HIGH (ref 70–99)
Glucose-Capillary: 133 mg/dL — ABNORMAL HIGH (ref 70–99)
Glucose-Capillary: 127 mg/dL — ABNORMAL HIGH (ref 70–99)
Glucose-Capillary: 154 mg/dL — ABNORMAL HIGH (ref 70–99)

## 2018-09-23 LAB — MAGNESIUM: Magnesium: 2.1 mg/dL (ref 1.7–2.4)

## 2018-09-23 MED ORDER — FUROSEMIDE 10 MG/ML IJ SOLN
80.0000 mg | Freq: Two times a day (BID) | INTRAMUSCULAR | Status: DC
  Administered 2018-09-23 – 2018-09-24 (×3): 80 mg via INTRAVENOUS
  Filled 2018-09-23 (×3): qty 8

## 2018-09-23 MED ORDER — SPIRONOLACTONE 25 MG PO TABS
25.0000 mg | ORAL_TABLET | Freq: Every day | ORAL | Status: DC
  Administered 2018-09-24 – 2018-09-26 (×3): 25 mg via ORAL
  Filled 2018-09-23 (×3): qty 1

## 2018-09-23 MED ORDER — OXYCODONE HCL 5 MG PO TABS
5.0000 mg | ORAL_TABLET | Freq: Once | ORAL | Status: AC
  Administered 2018-09-23: 5 mg via ORAL
  Filled 2018-09-23: qty 1

## 2018-09-23 MED ORDER — DILTIAZEM HCL ER COATED BEADS 180 MG PO CP24
360.0000 mg | ORAL_CAPSULE | Freq: Every day | ORAL | Status: DC
  Administered 2018-09-24 – 2018-09-26 (×3): 360 mg via ORAL
  Filled 2018-09-23 (×3): qty 2

## 2018-09-23 MED ORDER — DILTIAZEM HCL ER COATED BEADS 180 MG PO CP24
180.0000 mg | ORAL_CAPSULE | Freq: Once | ORAL | Status: AC
  Administered 2018-09-23: 180 mg via ORAL
  Filled 2018-09-23: qty 1

## 2018-09-23 MED ORDER — POTASSIUM CHLORIDE CRYS ER 20 MEQ PO TBCR
40.0000 meq | EXTENDED_RELEASE_TABLET | ORAL | Status: AC
  Administered 2018-09-23 (×2): 40 meq via ORAL
  Filled 2018-09-23 (×2): qty 2

## 2018-09-23 NOTE — Progress Notes (Signed)
Per RN, it appears the wife is upset that she has not been communicated about his care.   I had spend >30 mins at bedside updating his wife yesterday evening and his routine care about CHF/diuretics etc. All the questions were thoroughly answered.   This morning during my visit, after updating the patient about his care I had advised him with his permission I will be calling his wife later in the afternoon with the updates. In the meantime Cardiology would have had chance to see him.   I indeed called his wife and spoke with her for atleast 20 mins regarding details of his care, including current on going care, his disposition, planned CT scan due to AMS, and XR lumbar spine for his back. I had advised the wife, I only expect chronic changes but if there is anything acute I will call her back with updates. Wife even tells me she is in contact with our CM Levada Dy) regarding DME products they requested which will be delivered on Monday. She was informed, patient likely will not be discharged prior to Monday. She did not have any further questions.   CT scan reviewed= No acute changes, Remote lacunar infarcts of basal ganglia b/l   XR lumbar spine= No acute changes, adv DDD and scoliosis.   I have notified the patient's RN and the Charge RN about my communications with his wife from previous days and today.  Please call with more questions if needed.

## 2018-09-23 NOTE — Progress Notes (Signed)
13:34: Patient received tylenol for arthritic back pain.  13:27: Patient called back out stating his pain was still present. This RN went to assess patient and found struggling to talk. Patient head was shaking and pt was experiencing dysarthria and aphasia. Noticed patient's oxygen cannula was removed and on the floor. Oxygen re-placed. O2 saturations currently 93%. Dr. Reesa Chew made aware of situation. Verbal order received for oxycodone 5 mg once for back pain. STAT head CT order placed.  16:03: patient being transported off unit to CT at this time.   Hiram Comber, RN 09/23/2018 4:03 PM

## 2018-09-23 NOTE — Progress Notes (Signed)
Patient's wife extremely upset about the lack of communication between doctors and her. She makes all the decisions for the patient and is upset she is not being "included" in his medical care. Dr. Reesa Chew paged to update patient's wife.   Hiram Comber, RN 09/23/2018 4:06 PM

## 2018-09-23 NOTE — Progress Notes (Signed)
Advanced Heart Failure Rounding Note   Subjective:    Diuresing well. Breathing better. Edema improving. Still with severe arthritis pain.   HR fast with MAT    Objective:   Weight Range:  Vital Signs:   Temp:  [97.3 F (36.3 C)-98.4 F (36.9 C)] 97.3 F (36.3 C) (08/22 1151) Pulse Rate:  [62-92] 79 (08/22 1151) Resp:  [16-24] 18 (08/22 1151) BP: (106-137)/(69-80) 123/79 (08/22 1151) SpO2:  [93 %-98 %] 98 % (08/22 1151) Weight:  [94.8 kg] 94.8 kg (08/22 0439) Last BM Date: 09/23/18  Weight change: Filed Weights   09/20/18 1240 09/22/18 0500 09/23/18 0439  Weight: 93 kg 96.2 kg 94.8 kg    Intake/Output:   Intake/Output Summary (Last 24 hours) at 09/23/2018 1306 Last data filed at 09/23/2018 0941 Gross per 24 hour  Intake 120 ml  Output 1100 ml  Net -980 ml     Physical Exam: General:  Elderly male sitting up in bed No resp difficulty HEENT: normal Neck: supple. JVP 10. Carotids 2+ bilat; no bruits. No lymphadenopathy or thryomegaly appreciated. Cor: PMI nondisplaced. Irregular tachy No rubs, gallops or murmurs. Lungs: clear Abdomen: soft, nontender, nondistended. No hepatosplenomegaly. No bruits or masses. Good bowel sounds. Extremities: no cyanosis, clubbing, rash, 2+ edema + ted hose Neuro: alert & orientedx3, cranial nerves grossly intact. moves all 4 extremities w/o difficulty. Affect pleasant  Telemetry: Appears to be MAT 100-130 range Personally reviewed   Labs: Basic Metabolic Panel: Recent Labs  Lab 09/20/18 1327 09/20/18 1339 09/21/18 0215 09/22/18 0230 09/23/18 0219  NA 135 135 138 139 138  K 4.5 4.5 4.1 3.9 3.3*  CL 92*  --  95* 95* 94*  CO2 30  --  32 32 30  GLUCOSE 229*  --  146* 131* 140*  BUN 36*  --  27* 27* 22  CREATININE 1.65*  --  1.40* 1.46* 1.26*  CALCIUM 8.4*  --  8.4* 8.4* 8.4*  MG  --   --   --  2.0 2.1    Liver Function Tests: No results for input(s): AST, ALT, ALKPHOS, BILITOT, PROT, ALBUMIN in the last 168  hours. No results for input(s): LIPASE, AMYLASE in the last 168 hours. No results for input(s): AMMONIA in the last 168 hours.  CBC: Recent Labs  Lab 09/20/18 1327 09/20/18 1339 09/21/18 0215 09/22/18 0230 09/23/18 0219  WBC 6.5  --  7.7 7.0 6.1  HGB 13.6 15.0 13.7 13.5 13.7  HCT 44.9 44.0 45.7 43.0 42.8  MCV 95.3  --  95.0 92.3 91.3  PLT 202  --  212 230 248    Cardiac Enzymes: No results for input(s): CKTOTAL, CKMB, CKMBINDEX, TROPONINI in the last 168 hours.  BNP: BNP (last 3 results) Recent Labs    09/07/18 1241 09/20/18 1730 09/22/18 0230  BNP 40.6 86.2 104.3*    ProBNP (last 3 results) No results for input(s): PROBNP in the last 8760 hours.    Other results:  Imaging:  No results found.   Medications:     Scheduled Medications: . allopurinol  50 mg Oral Daily  . [START ON 09/24/2018] diltiazem  360 mg Oral Daily  . finasteride  5 mg Oral QHS  . FLUoxetine  10 mg Oral Daily  . furosemide  40 mg Intravenous BID  . glipiZIDE  2.5 mg Oral QAC breakfast  . heparin  5,000 Units Subcutaneous Q8H  . insulin aspart  0-9 Units Subcutaneous TID WC  . pantoprazole  40  mg Oral Daily  . pramipexole  0.75 mg Oral QHS  . spironolactone  12.5 mg Oral Daily     Infusions:   PRN Medications:  acetaminophen **OR** acetaminophen, ondansetron **OR** ondansetron (ZOFRAN) IV, polyethylene glycol, sodium chloride   Assessment/Plan:   1. Acute on chronic diastolic CHF: Echo with EF 60-65%, RV reportedly not markedly abnormal but PASP 59 mmHg. On exam today, he still looks volume overloaded though he has had reasonable diuresis so far in hospital.  - Responding well to lasix 40 IV bid but still with significant volume overload.Continue IV lasix can increase to 80 IV bid as needed - Increase spironolactone to 25 mg daily.  - Supp K - Continue TED hose 2. CKD: Stage 3. Creatinine stable with diuresis so far. Watch very closely given recent AKI - Creatinine  1.2 today 3. 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 Last RHC in 6/20 showed mean PA pressure 34, PCWP 12 PVR 5.3 WU, CI 2.0 . Patient did not tolerate Adcirca.  - Currently not on any selective pulmonary vasodialtors - Per Dr. Aundra Dubin, can consider ambrisentan in future based on RHC findings (was planned by his [facility] physician).  4. H/o MAT/PVCs: Follow on telemetry. - Rates elevated today. Increase cardizem tooday 5. DM2 - Consider SGLT2i if renal function stable 6. Hypokalemia - will supp    Length of Stay: 3   Glori Bickers MD 09/23/2018, 1:06 PM  Advanced Heart Failure Team Pager [phone] (M-F; 7a - 4p)  Please contact [facility] for night-coverage after hours (4p -7a ) and weekends on [email or web address]

## 2018-09-23 NOTE — Progress Notes (Signed)
PROGRESS NOTE    Ryan [name]  VHQ:[id number] DOB: 09-27-1931 DOA: 09/20/2018 PCP: Chesley Noon, MD   Brief Narrative:  85 year old male with history of non-Hodgkin's lymphoma status post radiation, essential hypertension, chronic hypoxic respiratory failure on 2 L nasal cannula, COPD/pulmonary fibrosis, hyperlipidemia came to the hospital with complaints of increasing lethargy and shortness of breath.  Recently his diuretics for reduced due to clinical signs of dehydration.  Upon admission he was noted to be in hypercarbic respiratory failure requiring BiPAP.  Receiving aggressive diuresis during the hospitalization.   Assessment & Plan:   Active Problems:   BPH (benign prostatic hyperplasia)   RLS (restless legs syndrome)   Lumbar radiculopathy   Asthma/COPD   Chronic diastolic CHF (congestive heart failure) (HCC)   Pulmonary hypertension (HCC)   Acute respiratory failure with hypoxia and hypercarbia (HCC)   CKD (chronic kidney disease) stage 3, GFR 30-59 ml/min (HCC)   Lethargy   Acute metabolic encephalopathy   CHF (congestive heart failure) (HCC)   Acute hypercarbic respiratory failure Acute on chronic hypoxia, currently on 4 L nasal cannula.  On 2 L at home Acute on chronic diastolic congestive heart failure with preserved ejection fraction 60 to 65%, class III -Continue low-salt diet, fluid restriction - Lasix 40 mg IV twice daily, monitor electrolytes.  1 dose of Zaroxolyn -Hold AVSS volume status. -Last echocardiogram 6/20-showed ejection fraction 60 to 65%. -Wean off oxygen. -Aldactone 12.5 mg orally daily  Acute metabolic encephalopathy, resolved -This is secondary to CO2 narcosis.  This is resolved.  A fib/Atrial tachycardia, HR 140s intermittently.  -Increase Cardizem to home dose of 326m po daily.   Mild pulmonary hypertension -This is combination of underlying cardiac disease and also his pulmonary fibrosis.  Diabetes mellitus type 2 -Not on any  home medication.  Previous visit showed hemoglobin A1c 8.6.  Continue insulin sliding scale and Accu-Chek -We will consult diabetic coordinator, may need to start him on oral diabetic regimen.  Unless the patient prefers strict diet  Chronic kidney disease stage III -Creatinine around baseline of 1.8.  Today's 1.4  History of BPH -Continue Proscar  Chronic low back pain -Pain control with Tylenol  History of depression, stable -Daily Prozac  History of gout, stable -Allopurinol  PT/OT-ordered  DVT prophylaxis: Subcutaneous heparin Code Status: DNR Family Communication: None at bedside Disposition Plan: Maintain hospital stay for aggressive IV diuretics  Consultants:   CHF team  Procedures:   None  Antimicrobials:   None   Subjective: Feels better, tells me he did not get much rest last night.  He says he is fearful of falling asleep due to anxiety and thinks will not wake up.  Review of Systems Otherwise negative except as per HPI, including: General = no fevers, chills, dizziness, malaise, fatigue HEENT/EYES = negative for pain, redness, loss of vision, double vision, blurred vision, loss of hearing, sore throat, hoarseness, dysphagia Cardiovascular= negative for chest pain, palpitation, murmurs, lower extremity swelling Respiratory/lungs= negative for shortness of breath, cough, hemoptysis, wheezing, mucus production Gastrointestinal= negative for nausea, vomiting,, abdominal pain, melena, hematemesis Genitourinary= negative for Dysuria, Hematuria, Change in Urinary Frequency MSK = Negative for arthralgia, myalgias, Back Pain, Joint swelling  Neurology= Negative for headache, seizures, numbness, tingling  Psychiatry= Negative for anxiety, depression, suicidal and homocidal ideation Allergy/Immunology= Medication/Food allergy as listed  Skin= Negative for Rash, lesions, ulcers, itching   Objective: Vitals:   09/22/18 2004 09/23/18 0020 09/23/18 0439 09/23/18  0751  BP: 130/79 133/76 119/69 106/80  Pulse: 80 92 62 85  Resp:  (!) _0 Temp: 97.8 F (36.6 C) 98 F (36.7 C) (!) 97.5 F (36.4 C) 98.4 F (36.9 C)  TempSrc: Oral Oral Oral Oral  SpO2: 96% 95% 94% 95%  Weight:   94.8 kg   Height:        Intake/Output Summary (Last 24 hours) at 09/23/2018 1006 Last data filed at 09/23/2018 0941 Gross per 24 hour  Intake 240 ml  Output 1900 ml  Net -1660 ml   Filed Weights   09/20/18 1240 09/22/18 0500 09/23/18 0439  Weight: 93 kg 96.2 kg 94.8 kg    Examination:  Constitutional: NAD, calm, comfortable, 3 L nasal cannula Eyes: PERRL, lids and conjunctivae normal ENMT: Mucous membranes are moist. Posterior pharynx clear of any exudate or lesions.Normal dentition.  Neck: normal, supple, no masses, no thyromegaly Respiratory: Bibasilar crackles Cardiovascular: Regular rate and rhythm, no murmurs / rubs / gallops.  3+ lower extremity pitting edema. 2+ pedal pulses. No carotid bruits.  Abdomen: no tenderness, no masses palpated. No hepatosplenomegaly. Bowel sounds positive.  Musculoskeletal: no clubbing / cyanosis. No joint deformity upper and lower extremities. Good ROM, no contractures. Normal muscle tone.  Skin: no rashes, lesions, ulcers. No induration Neurologic: CN 2-12 grossly intact. Sensation intact, DTR normal. Strength 5/5 in all 4.  Psychiatric: Normal judgment and insight. Alert and oriented x 3. Normal mood.     Data Reviewed:   CBC: Recent Labs  Lab 09/20/18 1327 09/20/18 1339 09/21/18 0215 09/22/18 0230 09/23/18 0219  WBC 6.5  --  7.7 7.0 6.1  HGB 13.6 15.0 13.7 13.5 13.7  HCT 44.9 44.0 45.7 43.0 42.8  MCV 95.3  --  95.0 92.3 91.3  PLT 202  --  212 230 734   Basic Metabolic Panel: Recent Labs  Lab 09/20/18 1327 09/20/18 1339 09/21/18 0215 09/22/18 0230 09/23/18 0219  NA 135 135 138 139 138  K 4.5 4.5 4.1 3.9 3.3*  CL 92*  --  95* 95* 94*  CO2 30  --  32 32 30  GLUCOSE 229*  --  146* 131* 140*  BUN  36*  --  27* 27* 22  CREATININE 1.65*  --  1.40* 1.46* 1.26*  CALCIUM 8.4*  --  8.4* 8.4* 8.4*  MG  --   --   --  2.0 2.1   GFR: Estimated Creatinine Clearance: 51.3 mL/min (A) (by C-G formula based on SCr of 1.26 mg/dL (H)). Liver Function Tests: No results for input(s): AST, ALT, ALKPHOS, BILITOT, PROT, ALBUMIN in the last 168 hours. No results for input(s): LIPASE, AMYLASE in the last 168 hours. No results for input(s): AMMONIA in the last 168 hours. Coagulation Profile: No results for input(s): INR, PROTIME in the last 168 hours. Cardiac Enzymes: No results for input(s): CKTOTAL, CKMB, CKMBINDEX, TROPONINI in the last 168 hours. BNP (last 3 results) No results for input(s): PROBNP in the last 8760 hours. HbA1C: No results for input(s): HGBA1C in the last 72 hours. CBG: Recent Labs  Lab 09/22/18 0804 09/22/18 1153 09/22/18 1638 09/22/18 2253 09/23/18 0753  GLUCAP 170* 120* 138* 183* 169*   Lipid Profile: No results for input(s): CHOL, HDL, LDLCALC, TRIG, CHOLHDL, LDLDIRECT in the last 72 hours. Thyroid Function Tests: No results for input(s): TSH, T4TOTAL, FREET4, T3FREE, THYROIDAB in the last 72 hours. Anemia Panel: No results for input(s): VITAMINB12, FOLATE, FERRITIN, TIBC, IRON, RETICCTPCT in the last 72 hours. Sepsis Labs: No results for input(s):  PROCALCITON, LATICACIDVEN in the last 168 hours.  Recent Results (from the past 240 hour(s))  SARS Coronavirus 2 [facility] order, Performed in [facility] hospital lab) Nasopharyngeal Nasopharyngeal Swab     Status: None   Collection Time: 09/20/18  2:32 PM   Specimen: Nasopharyngeal Swab  Result Value Ref Range Status   SARS Coronavirus 2 NEGATIVE NEGATIVE Final    Comment: (NOTE) If result is NEGATIVE SARS-CoV-2 target nucleic acids are NOT DETECTED. The SARS-CoV-2 RNA is generally detectable in upper and lower  respiratory specimens during the acute phase of infection. The lowest  concentration of SARS-CoV-2 viral  copies this assay can detect is 250  copies / mL. A negative result does not preclude SARS-CoV-2 infection  and should not be used as the sole basis for treatment or other  patient management decisions.  A negative result may occur with  improper specimen collection / handling, submission of specimen other  than nasopharyngeal swab, presence of viral mutation(s) within the  areas targeted by this assay, and inadequate number of viral copies  (<250 copies / mL). A negative result must be combined with clinical  observations, patient history, and epidemiological information. If result is POSITIVE SARS-CoV-2 target nucleic acids are DETECTED. The SARS-CoV-2 RNA is generally detectable in upper and lower  respiratory specimens dur ing the acute phase of infection.  Positive  results are indicative of active infection with SARS-CoV-2.  Clinical  correlation with patient history and other diagnostic information is  necessary to determine patient infection status.  Positive results do  not rule out bacterial infection or co-infection with other viruses. If result is PRESUMPTIVE POSTIVE SARS-CoV-2 nucleic acids MAY BE PRESENT.   A presumptive positive result was obtained on the submitted specimen  and confirmed on repeat testing.  While 2019 novel coronavirus  (SARS-CoV-2) nucleic acids may be present in the submitted sample  additional confirmatory testing may be necessary for epidemiological  and / or clinical management purposes  to differentiate between  SARS-CoV-2 and other Sarbecovirus currently known to infect humans.  If clinically indicated additional testing with an alternate test  methodology [phone]) is advised. The SARS-CoV-2 RNA is generally  detectable in upper and lower respiratory sp ecimens during the acute  phase of infection. The expected result is Negative. Fact Sheet for Patients:  StrictlyIdeas.no Fact Sheet for Healthcare Providers:  BankingDealers.co.za This test is not yet approved or cleared by the [place] FDA and has been authorized for detection and/or diagnosis of SARS-CoV-2 by FDA under an Emergency Use Authorization (EUA).  This EUA will remain in effect (meaning this test can be used) for the duration of the COVID-19 declaration under Section 564(b)(1) of the Act, 21 U.S.C. section 360bbb-3(b)(1), unless the authorization is terminated or revoked sooner. Performed at [facility], [place] [address], [place], [place] [address]          Radiology Studies: No results found.      Scheduled Meds: . allopurinol  50 mg Oral Daily  . diltiazem  180 mg Oral Daily  . finasteride  5 mg Oral QHS  . FLUoxetine  10 mg Oral Daily  . furosemide  40 mg Intravenous BID  . glipiZIDE  2.5 mg Oral QAC breakfast  . heparin  5,000 Units Subcutaneous Q8H  . insulin aspart  0-9 Units Subcutaneous TID WC  . pantoprazole  40 mg Oral Daily  . potassium chloride  40 mEq Oral Q4H  . pramipexole  0.75 mg Oral QHS  .  spironolactone  12.5 mg Oral Daily   Continuous Infusions:   LOS: 3 days   Time spent= 35 mins    Eura Radabaugh Arsenio Loader, MD [facility]  If 7PM-7AM, please contact night-coverage [email or web address] 09/23/2018, 10:06 AM

## 2018-09-23 NOTE — Progress Notes (Signed)
Prior to shift change on 8/21 at 1800, patient's condom catheter began dislodged and caused an incontinence episode. Patient's ted hose began saturated. Passed along to night shift team to replace ted hose that were already ordered for patient.   Upon entering room this morning, patient did not have ted hose present. Ted hose re-ordered and will be applied as soon as they become available.   Hiram Comber, RN 09/23/2018 8:35 AM

## 2018-09-23 NOTE — Progress Notes (Signed)
Occupational Therapy Treatment Patient Details Name: Ryan [name] MRN: [id number] DOB: November 20, 1931 Today's Date: 09/23/2018    History of present illness 85 yo with history of interstitial lung disease (?NSIP vs UIP) on home oxygen, pulmonary hypertension, diastolic CHF, and prior non-Hodgkins lymphoma (pelvic mass, s/p XRT).  Pt admitted with increasing lethargy and shortness of breath.   OT comments  Pt. Seen for skilled OT. C/o feeling bad and SOB.  Declines ADLs but agreeable to energy conservation education and bed mobility.  Bed mobility min a.  Reviewed log roll tech. As he has c/o back pain that is constant due to arthritis.    Follow Up Recommendations  Supervision/Assistance - 24 hour;Home health OT    Equipment Recommendations  3 in 1 bedside commode    Recommendations for Other Services      Precautions / Restrictions Precautions Precautions: Fall       Mobility Bed Mobility Overal bed mobility: Needs Assistance Bed Mobility: Supine to Sit     Supine to sit: Min assist Sit to supine: Mod assist   General bed mobility comments: educated and able to return demo of side stepping towards hob to allow for less repositioning once in bed for comfort and energy conservation  Transfers Overall transfer level: Needs assistance Equipment used: Rolling walker (2 wheeled) Transfers: Sit to/from Stand Sit to Stand: Min assist         General transfer comment: MIN A to for initial push and min a for side stepping towards hob    Balance                                           ADL either performed or assessed with clinical judgement   ADL Overall ADL's : Needs assistance/impaired                                       General ADL Comments: pt. declines ADL tx today due to fatigue and weakness.  agreeable to introduction of energy conservation strategies and bed mobility and repositioning due to c/o being un comfortable in bed      Vision       Perception     Praxis      Cognition   Behavior During Therapy: [facility] for tasks assessed/performed Overall Cognitive Status: No family/caregiver present to determine baseline cognitive functioning                                          Exercises     Shoulder Instructions       General Comments      Pertinent Vitals/ Pain       Pain Assessment: c/o constant back pain from arthritis, encouraged and assisted pt. With repositioning.   Home Living                                          Prior Functioning/Environment              Frequency  Min 2X/week        Progress Toward Goals  OT Goals(current goals  can now be found in the care plan section)  Progress towards OT goals: Progressing toward goals     Plan Discharge plan needs to be updated    Co-evaluation                 AM-PAC OT "6 Clicks" Daily Activity     Outcome Measure   Help from another person eating meals?: None Help from another person taking care of personal grooming?: A Little Help from another person toileting, which includes using toliet, bedpan, or urinal?: A Lot Help from another person bathing (including washing, rinsing, drying)?: A Lot Help from another person to put on and taking off regular upper body clothing?: A Little Help from another person to put on and taking off regular lower body clothing?: A Lot 6 Click Score: 16    End of Session Equipment Utilized During Treatment: Rolling walker;Oxygen  OT Visit Diagnosis: Unsteadiness on feet (R26.81);Other abnormalities of gait and mobility (R26.89);Repeated falls (R29.6);History of falling (Z91.81)   Activity Tolerance Patient tolerated treatment well   Patient Left in bed;with call bell/phone within reach;with bed alarm set   Nurse Communication Other (comment)(spoke with rn who states it is ok to attempt therapy and work with pt.)        Time: 5825-1898 OT  Time Calculation (min): 17 min  Charges: OT General Charges $OT Visit: 1 Visit OT Treatments $Self Care/Home Management : 8-22 mins   Janice Coffin, COTA/L 09/23/2018, 2:01 PM

## 2018-09-23 NOTE — Progress Notes (Signed)
Upon getting patient up to bathroom, patient in afib with rvr 150's and went as high as 170's. Extremely sob on exertion.   Hiram Comber, RN 09/23/2018 8:42 AM

## 2018-09-24 LAB — CBC
HCT: 44.2 % (ref 39.0–52.0)
Hemoglobin: 13.9 g/dL (ref 13.0–17.0)
MCH: 28.8 pg (ref 26.0–34.0)
MCHC: 31.4 g/dL (ref 30.0–36.0)
MCV: 91.7 fL (ref 80.0–100.0)
Platelets: 238 10*3/uL (ref 150–400)
RBC: 4.82 MIL/uL (ref 4.22–5.81)
RDW: 16 % — ABNORMAL HIGH (ref 11.5–15.5)
WBC: 5.6 10*3/uL (ref 4.0–10.5)
nRBC: 0 % (ref 0.0–0.2)

## 2018-09-24 LAB — BASIC METABOLIC PANEL
Anion gap: 14 (ref 5–15)
BUN: 17 mg/dL (ref 8–23)
CO2: 29 mmol/L (ref 22–32)
Calcium: 8.7 mg/dL — ABNORMAL LOW (ref 8.9–10.3)
Chloride: 96 mmol/L — ABNORMAL LOW (ref 98–111)
Creatinine, Ser: 1.39 mg/dL — ABNORMAL HIGH (ref 0.61–1.24)
GFR calc Af Amer: 54 mL/min — ABNORMAL LOW (ref >60)
GFR calc non Af Amer: 46 mL/min — ABNORMAL LOW (ref >60)
Glucose, Bld: 121 mg/dL — ABNORMAL HIGH (ref 70–99)
Potassium: 3.4 mmol/L — ABNORMAL LOW (ref 3.5–5.1)
Sodium: 139 mmol/L (ref 135–145)

## 2018-09-24 LAB — BLOOD GAS, ARTERIAL
Acid-Base Excess: 10.9 mmol/L — ABNORMAL HIGH (ref 0.0–2.0)
Bicarbonate: 34.9 mmol/L — ABNORMAL HIGH (ref 20.0–28.0)
Drawn by: 244861
O2 Content: 2 L/min
O2 Saturation: 96.3 %
Patient temperature: 98
pCO2 arterial: 45.4 mmHg (ref 32.0–48.0)
pH, Arterial: 7.496 — ABNORMAL HIGH (ref 7.350–7.450)
pO2, Arterial: 74.2 mmHg — ABNORMAL LOW (ref 83.0–108.0)

## 2018-09-24 LAB — GLUCOSE, CAPILLARY
Glucose-Capillary: 120 mg/dL — ABNORMAL HIGH (ref 70–99)
Glucose-Capillary: 114 mg/dL — ABNORMAL HIGH (ref 70–99)
Glucose-Capillary: 163 mg/dL — ABNORMAL HIGH (ref 70–99)

## 2018-09-24 LAB — MAGNESIUM: Magnesium: 1.8 mg/dL (ref 1.7–2.4)

## 2018-09-24 MED ORDER — TRAZODONE HCL 50 MG PO TABS
100.0000 mg | ORAL_TABLET | Freq: Once | ORAL | Status: AC
  Administered 2018-09-24: 100 mg via ORAL
  Filled 2018-09-24: qty 2

## 2018-09-24 MED ORDER — POTASSIUM CHLORIDE CRYS ER 20 MEQ PO TBCR
40.0000 meq | EXTENDED_RELEASE_TABLET | Freq: Once | ORAL | Status: AC
  Administered 2018-09-24: 40 meq via ORAL
  Filled 2018-09-24: qty 2

## 2018-09-24 MED ORDER — MAGNESIUM OXIDE 400 (241.3 MG) MG PO TABS
800.0000 mg | ORAL_TABLET | Freq: Once | ORAL | Status: AC
  Administered 2018-09-24: 800 mg via ORAL
  Filled 2018-09-24: qty 2

## 2018-09-24 MED ORDER — ALPRAZOLAM 0.25 MG PO TABS
0.2500 mg | ORAL_TABLET | Freq: Two times a day (BID) | ORAL | Status: DC | PRN
  Administered 2018-09-24 (×2): 0.25 mg via ORAL
  Filled 2018-09-24 (×2): qty 1

## 2018-09-24 MED ORDER — TRAZODONE HCL 50 MG PO TABS
100.0000 mg | ORAL_TABLET | Freq: Every evening | ORAL | Status: DC | PRN
  Administered 2018-09-24 – 2018-09-25 (×2): 100 mg via ORAL
  Filled 2018-09-24 (×2): qty 2

## 2018-09-24 NOTE — Progress Notes (Signed)
Pt not in need of BIPAP at this time. Pt order is PRN. Pt respiratory status stable at this time on [place] 2 Lpm. RT will continue to monitor.

## 2018-09-24 NOTE — Progress Notes (Signed)
PROGRESS NOTE    Ryan [name]  ZOX:[id number] DOB: 1931-11-21 DOA: 09/20/2018 PCP: Chesley Noon, MD   Brief Narrative:  85 year old male with history of non-Hodgkin's lymphoma status post radiation, essential hypertension, chronic hypoxic respiratory failure on 2 L nasal cannula, COPD/pulmonary fibrosis, hyperlipidemia came to the hospital with complaints of increasing lethargy and shortness of breath.  Recently his diuretics for reduced due to clinical signs of dehydration.  Upon admission he was noted to be in hypercarbic respiratory failure requiring BiPAP.  Receiving aggressive diuresis during the hospitalization.   Assessment & Plan:   Active Problems:   BPH (benign prostatic hyperplasia)   RLS (restless legs syndrome)   Lumbar radiculopathy   Asthma/COPD   Chronic diastolic CHF (congestive heart failure) (HCC)   Pulmonary hypertension (HCC)   Acute respiratory failure with hypoxia and hypercarbia (HCC)   CKD (chronic kidney disease) stage 3, GFR 30-59 ml/min (HCC)   Lethargy   Acute metabolic encephalopathy   CHF (congestive heart failure) (HCC)   Acute hypercarbic respiratory failure Acute on chronic hypoxia, currently on 3 L nasal cannula.  On 2 L at home Acute on chronic diastolic congestive heart failure with preserved ejection fraction 60 to 65%, class III -Continue low-salt diet, fluid restriction -Cont IV lasix.  -Last echocardiogram 6/20-showed ejection fraction 60 to 65%. -Wean off oxygen. -Aldactone 12.5 mg orally daily CHF team following- appreciate input.   Acute metabolic encephalopathy, resolved -Improved. CT head - neg for acute changes.   Low back pain, chronic DDD and scoliosis -Pain control. X-ray does not show anything acute.  A fib/Atrial tachycardia, improved -Cont Cardizem to home dose of 348m po daily.   Mild pulmonary hypertension -This is combination of underlying cardiac disease and also his pulmonary fibrosis.  Diabetes  mellitus type 2 -Not on any home medication.  Previous visit showed hemoglobin A1c 8.6.  Continue insulin sliding scale and Accu-Chek -Better controlled on glipizide 2.5 mg daily  Chronic kidney disease stage III -Creatinine around baseline of 1.8.  Today's 1.39  History of BPH -Continue Proscar  Chronic low back pain -Pain control with Tylenol  History of depression, stable -Daily Prozac  History of gout, stable -Allopurinol  PT/OT-ordered  Elevated mews score-transient.  Appeared comfortable when seen and examined.  DVT prophylaxis: Subcutaneous heparin Code Status: DNR Family Communication: Updated his wife over the phone this morning. Disposition Plan: 1 more day of IV diuretics.  Hopefully discharge tomorrow.  Consultants:   CHF team  Procedures:   None  Antimicrobials:   None   Subjective: Tells me he feels much better today.  Yesterday evening he had some intermittent slurring of the speech secondary to pain and lower back pain.  For which x-rays and CT head was performed which was negative for acute changes. This morning seen and examined at bedside, feels a whole lot better in terms of his breathing, speech and back pain.  No new complaints.  Diuresed well overnight.  Review of Systems Otherwise negative except as per HPI, including: General = no fevers, chills, dizziness, malaise, fatigue HEENT/EYES = negative for pain, redness, loss of vision, double vision, blurred vision, loss of hearing, sore throat, hoarseness, dysphagia Cardiovascular= negative for chest pain, palpitation, murmurs, lower extremity swelling Respiratory/lungs= negative for shortness of breath, cough, hemoptysis, wheezing, mucus production Gastrointestinal= negative for nausea, vomiting,, abdominal pain, melena, hematemesis Genitourinary= negative for Dysuria, Hematuria, Change in Urinary Frequency MSK = Negative for arthralgia, myalgias, Back Pain, Joint swelling  Neurology=  Negative  for headache, seizures, numbness, tingling  Psychiatry= Negative for anxiety, depression, suicidal and homocidal ideation Allergy/Immunology= Medication/Food allergy as listed  Skin= Negative for Rash, lesions, ulcers, itching   Objective: Vitals:   09/24/18 0900 09/24/18 0917 09/24/18 0949 09/24/18 1010  BP: 105/78 118/71 120/75 119/82  Pulse: 97   (!) 101  Resp: (!) 23 (!) 28 (!) 35 (!) 22  Temp: 98.4 F (36.9 C) 97.9 F (36.6 C) 98.1 F (36.7 C) 98.4 F (36.9 C)  TempSrc: Oral Oral Oral Oral  SpO2: 96% 95% 93% 95%  Weight:      Height:        Intake/Output Summary (Last 24 hours) at 09/24/2018 1107 Last data filed at 09/24/2018 0958 Gross per 24 hour  Intake 360 ml  Output 2700 ml  Net -2340 ml   Filed Weights   09/20/18 1240 09/22/18 0500 09/23/18 0439  Weight: 93 kg 96.2 kg 94.8 kg    Examination: Constitutional: NAD, calm, comfortable, 3 L nasal cannula Eyes: PERRL, lids and conjunctivae normal ENMT: Mucous membranes are moist. Posterior pharynx clear of any exudate or lesions.Normal dentition.  Neck: normal, supple, no masses, no thyromegaly Respiratory: Bibasilar crackles Cardiovascular: Regular rate and rhythm, no murmurs / rubs / gallops.  2+ bilateral lower extremity pitting edema 2+ pedal pulses. No carotid bruits.  Abdomen: no tenderness, no masses palpated. No hepatosplenomegaly. Bowel sounds positive.  Musculoskeletal: no clubbing / cyanosis. No joint deformity upper and lower extremities. Good ROM, no contractures. Normal muscle tone.  Skin: no rashes, lesions, ulcers. No induration Neurologic: CN 2-12 grossly intact. Sensation intact, DTR normal. Strength 4+/5 in all 4.  Psychiatric: Normal judgment and insight. Alert and oriented x 3. Normal mood.    Data Reviewed:   CBC: Recent Labs  Lab 09/20/18 1327 09/20/18 1339 09/21/18 0215 09/22/18 0230 09/23/18 0219 09/24/18 0327  WBC 6.5  --  7.7 7.0 6.1 5.6  HGB 13.6 15.0 13.7 13.5 13.7  13.9  HCT 44.9 44.0 45.7 43.0 42.8 44.2  MCV 95.3  --  95.0 92.3 91.3 91.7  PLT 202  --  212 230 248 203   Basic Metabolic Panel: Recent Labs  Lab 09/20/18 1327 09/20/18 1339 09/21/18 0215 09/22/18 0230 09/23/18 0219 09/24/18 0327  NA 135 135 138 139 138 139  K 4.5 4.5 4.1 3.9 3.3* 3.4*  CL 92*  --  95* 95* 94* 96*  CO2 30  --  32 32 30 29  GLUCOSE 229*  --  146* 131* 140* 121*  BUN 36*  --  27* 27* 22 17  CREATININE 1.65*  --  1.40* 1.46* 1.26* 1.39*  CALCIUM 8.4*  --  8.4* 8.4* 8.4* 8.7*  MG  --   --   --  2.0 2.1 1.8   GFR: Estimated Creatinine Clearance: 46.5 mL/min (A) (by C-G formula based on SCr of 1.39 mg/dL (H)). Liver Function Tests: No results for input(s): AST, ALT, ALKPHOS, BILITOT, PROT, ALBUMIN in the last 168 hours. No results for input(s): LIPASE, AMYLASE in the last 168 hours. No results for input(s): AMMONIA in the last 168 hours. Coagulation Profile: No results for input(s): INR, PROTIME in the last 168 hours. Cardiac Enzymes: No results for input(s): CKTOTAL, CKMB, CKMBINDEX, TROPONINI in the last 168 hours. BNP (last 3 results) No results for input(s): PROBNP in the last 8760 hours. HbA1C: No results for input(s): HGBA1C in the last 72 hours. CBG: Recent Labs  Lab 09/23/18 0753 09/23/18 1151 09/23/18 1701 09/23/18 2127  09/24/18 0803  GLUCAP 169* 133* 127* 154* 120*   Lipid Profile: No results for input(s): CHOL, HDL, LDLCALC, TRIG, CHOLHDL, LDLDIRECT in the last 72 hours. Thyroid Function Tests: No results for input(s): TSH, T4TOTAL, FREET4, T3FREE, THYROIDAB in the last 72 hours. Anemia Panel: No results for input(s): VITAMINB12, FOLATE, FERRITIN, TIBC, IRON, RETICCTPCT in the last 72 hours. Sepsis Labs: No results for input(s): PROCALCITON, LATICACIDVEN in the last 168 hours.  Recent Results (from the past 240 hour(s))  SARS Coronavirus 2 [facility] order, Performed in [facility] hospital lab) Nasopharyngeal Nasopharyngeal Swab      Status: None   Collection Time: 09/20/18  2:32 PM   Specimen: Nasopharyngeal Swab  Result Value Ref Range Status   SARS Coronavirus 2 NEGATIVE NEGATIVE Final    Comment: (NOTE) If result is NEGATIVE SARS-CoV-2 target nucleic acids are NOT DETECTED. The SARS-CoV-2 RNA is generally detectable in upper and lower  respiratory specimens during the acute phase of infection. The lowest  concentration of SARS-CoV-2 viral copies this assay can detect is 250  copies / mL. A negative result does not preclude SARS-CoV-2 infection  and should not be used as the sole basis for treatment or other  patient management decisions.  A negative result may occur with  improper specimen collection / handling, submission of specimen other  than nasopharyngeal swab, presence of viral mutation(s) within the  areas targeted by this assay, and inadequate number of viral copies  (<250 copies / mL). A negative result must be combined with clinical  observations, patient history, and epidemiological information. If result is POSITIVE SARS-CoV-2 target nucleic acids are DETECTED. The SARS-CoV-2 RNA is generally detectable in upper and lower  respiratory specimens dur ing the acute phase of infection.  Positive  results are indicative of active infection with SARS-CoV-2.  Clinical  correlation with patient history and other diagnostic information is  necessary to determine patient infection status.  Positive results do  not rule out bacterial infection or co-infection with other viruses. If result is PRESUMPTIVE POSTIVE SARS-CoV-2 nucleic acids MAY BE PRESENT.   A presumptive positive result was obtained on the submitted specimen  and confirmed on repeat testing.  While 2019 novel coronavirus  (SARS-CoV-2) nucleic acids may be present in the submitted sample  additional confirmatory testing may be necessary for epidemiological  and / or clinical management purposes  to differentiate between  SARS-CoV-2 and other  Sarbecovirus currently known to infect humans.  If clinically indicated additional testing with an alternate test  methodology [phone]) is advised. The SARS-CoV-2 RNA is generally  detectable in upper and lower respiratory sp ecimens during the acute  phase of infection. The expected result is Negative. Fact Sheet for Patients:  StrictlyIdeas.no Fact Sheet for Healthcare Providers: BankingDealers.co.za This test is not yet approved or cleared by the [place] FDA and has been authorized for detection and/or diagnosis of SARS-CoV-2 by FDA under an Emergency Use Authorization (EUA).  This EUA will remain in effect (meaning this test can be used) for the duration of the COVID-19 declaration under Section 564(b)(1) of the Act, 21 U.S.C. section 360bbb-3(b)(1), unless the authorization is terminated or revoked sooner. Performed at [facility], [place] [address] [place], [place] [address]          Radiology Studies: Dg Lumbar Spine 2-3 Views  Result Date: 09/23/2018 CLINICAL DATA:  Chronic low back pain EXAM: LUMBAR SPINE - 2-3 VIEW COMPARISON:  03/06/2014 FINDINGS: Rightward scoliosis centered in the mid lumbar  spine. Diffuse degenerative disc and facet disease, moderate to advanced, most pronounced at L2-3 related to scoliosis. No fracture or subluxation. No change since prior study. Aortic atherosclerosis. No aneurysm. IMPRESSION: Rightward scoliosis. Advanced degenerative disc and facet disease. No acute bony abnormality. No change since prior study. Electronically Signed   By: Rolm Baptise M.D.   On: 09/23/2018 17:44   Ct Head Wo Contrast  Result Date: 09/23/2018 CLINICAL DATA:  This RN went to assess patient and found struggling to talk. Patient head was shaking and pt was experiencing dysarthria and aphasia. Noticed patient's oxygen cannula was removed and on the floor. EXAM: CT HEAD WITHOUT CONTRAST TECHNIQUE: Contiguous axial  images were obtained from the base of the skull through the vertex without intravenous contrast. COMPARISON:  None. FINDINGS: Brain: Remote lacunar infarcts of the basal ganglia bilaterally. There is central and cortical atrophy. Periventricular white matter changes are consistent with small vessel disease. There is no intra or extra-axial fluid collection or mass lesion. The basilar cisterns and ventricles have a normal appearance. There is no CT evidence for acute infarction or hemorrhage. Vascular: No hyperdense vessel or unexpected calcification. Skull: Normal. Negative for fracture or focal lesion. Sinuses/Orbits: No acute finding. Other: None IMPRESSION: 1. Atrophy and small vessel disease. 2. Remote lacunar infarcts of the basal ganglia bilaterally. 3. No evidence for acute intracranial abnormality. Electronically Signed   By: Nolon Nations M.D.   On: 09/23/2018 16:38        Scheduled Meds: . allopurinol  50 mg Oral Daily  . diltiazem  360 mg Oral Daily  . finasteride  5 mg Oral QHS  . FLUoxetine  10 mg Oral Daily  . furosemide  80 mg Intravenous BID  . glipiZIDE  2.5 mg Oral QAC breakfast  . heparin  5,000 Units Subcutaneous Q8H  . insulin aspart  0-9 Units Subcutaneous TID WC  . pantoprazole  40 mg Oral Daily  . potassium chloride  40 mEq Oral Once  . pramipexole  0.75 mg Oral QHS  . spironolactone  25 mg Oral Daily   Continuous Infusions:   LOS: 4 days   Time spent= 35 mins    Stratton Villwock Arsenio Loader, MD [facility]  If 7PM-7AM, please contact night-coverage [email or web address] 09/24/2018, 11:07 AM

## 2018-09-24 NOTE — Progress Notes (Signed)
   09/24/18 0807  MEWS Score  ECG Heart Rate (!) 150  MEWS Score  MEWS RR 0  MEWS Pulse 3  MEWS Systolic 1  MEWS LOC 0  MEWS Temp 0  MEWS Score 4  MEWS Score Color Red    Patient's MEWS triggered to a 4 due to patient's elevated heart rate and increasing respiratory rate as patient was ambulating to restroom. Will begin MEWS protocol.  Hiram Comber, RN 09/24/2018 08:29 AM

## 2018-09-24 NOTE — Progress Notes (Addendum)
Advanced Heart Failure Rounding Note   Subjective:    Diuresing well. -2L overnight. No weight recorded. Creatinine relatively stable at 1.39  Says he hurts all over and chest is burning. Was told he has sleep apnea and had to stay in chair. Sats ranging mid 80s-low 90s on [place].   MAT rates improving with increased cardizem dose   Objective:   Weight Range:  Vital Signs:   Temp:  [97.3 F (36.3 C)-98.6 F (37 C)] 98.6 F (37 C) (08/23 1109) Pulse Rate:  [67-102] 102 (08/23 1109) Resp:  [18-35] 22 (08/23 1010) BP: (93-123)/(60-85) 113/85 (08/23 1109) SpO2:  [93 %-98 %] 95 % (08/23 1109) Last BM Date: 09/23/18  Weight change: Filed Weights   09/20/18 1240 09/22/18 0500 09/23/18 0439  Weight: 93 kg 96.2 kg 94.8 kg    Intake/Output:   Intake/Output Summary (Last 24 hours) at 09/24/2018 1111 Last data filed at 09/24/2018 0958 Gross per 24 hour  Intake 360 ml  Output 2700 ml  Net -2340 ml     Physical Exam: General:  Sitting up in chair. Hyperventialting at times HEENT: normal Neck: supple. JVP 8-9 Carotids 2+ bilat; no bruits. No lymphadenopathy or thryomegaly appreciated. Cor: PMI nondisplaced. Irregular rate & rhythm. No rubs, gallops or murmurs. Lungs: clear Abdomen: soft, nontender, nondistended. No hepatosplenomegaly. No bruits or masses. Good bowel sounds. Extremities: no cyanosis, clubbing, rash, tr-1+ edema + TEDs Neuro: alert & orientedx3, cranial nerves grossly intact. moves all 4 extremities w/o difficulty. Affect pleasant   Telemetry:  MAT 90-105 range Personally reviewed   Labs: Basic Metabolic Panel: Recent Labs  Lab 09/20/18 1327 09/20/18 1339 09/21/18 0215 09/22/18 0230 09/23/18 0219 09/24/18 0327  NA 135 135 138 139 138 139  K 4.5 4.5 4.1 3.9 3.3* 3.4*  CL 92*  --  95* 95* 94* 96*  CO2 30  --  32 32 30 29  GLUCOSE 229*  --  146* 131* 140* 121*  BUN 36*  --  27* 27* 22 17  CREATININE 1.65*  --  1.40* 1.46* 1.26* 1.39*  CALCIUM 8.4*   --  8.4* 8.4* 8.4* 8.7*  MG  --   --   --  2.0 2.1 1.8    Liver Function Tests: No results for input(s): AST, ALT, ALKPHOS, BILITOT, PROT, ALBUMIN in the last 168 hours. No results for input(s): LIPASE, AMYLASE in the last 168 hours. No results for input(s): AMMONIA in the last 168 hours.  CBC: Recent Labs  Lab 09/20/18 1327 09/20/18 1339 09/21/18 0215 09/22/18 0230 09/23/18 0219 09/24/18 0327  WBC 6.5  --  7.7 7.0 6.1 5.6  HGB 13.6 15.0 13.7 13.5 13.7 13.9  HCT 44.9 44.0 45.7 43.0 42.8 44.2  MCV 95.3  --  95.0 92.3 91.3 91.7  PLT 202  --  212 230 248 238    Cardiac Enzymes: No results for input(s): CKTOTAL, CKMB, CKMBINDEX, TROPONINI in the last 168 hours.  BNP: BNP (last 3 results) Recent Labs    09/07/18 1241 09/20/18 1730 09/22/18 0230  BNP 40.6 86.2 104.3*    ProBNP (last 3 results) No results for input(s): PROBNP in the last 8760 hours.    Other results:  Imaging: Dg Lumbar Spine 2-3 Views  Result Date: 09/23/2018 CLINICAL DATA:  Chronic low back pain EXAM: LUMBAR SPINE - 2-3 VIEW COMPARISON:  03/06/2014 FINDINGS: Rightward scoliosis centered in the mid lumbar spine. Diffuse degenerative disc and facet disease, moderate to advanced, most pronounced at L2-3  related to scoliosis. No fracture or subluxation. No change since prior study. Aortic atherosclerosis. No aneurysm. IMPRESSION: Rightward scoliosis. Advanced degenerative disc and facet disease. No acute bony abnormality. No change since prior study. Electronically Signed   By: Rolm Baptise M.D.   On: 09/23/2018 17:44   Ct Head Wo Contrast  Result Date: 09/23/2018 CLINICAL DATA:  This RN went to assess patient and found struggling to talk. Patient head was shaking and pt was experiencing dysarthria and aphasia. Noticed patient's oxygen cannula was removed and on the floor. EXAM: CT HEAD WITHOUT CONTRAST TECHNIQUE: Contiguous axial images were obtained from the base of the skull through the vertex without  intravenous contrast. COMPARISON:  None. FINDINGS: Brain: Remote lacunar infarcts of the basal ganglia bilaterally. There is central and cortical atrophy. Periventricular white matter changes are consistent with small vessel disease. There is no intra or extra-axial fluid collection or mass lesion. The basilar cisterns and ventricles have a normal appearance. There is no CT evidence for acute infarction or hemorrhage. Vascular: No hyperdense vessel or unexpected calcification. Skull: Normal. Negative for fracture or focal lesion. Sinuses/Orbits: No acute finding. Other: None IMPRESSION: 1. Atrophy and small vessel disease. 2. Remote lacunar infarcts of the basal ganglia bilaterally. 3. No evidence for acute intracranial abnormality. Electronically Signed   By: Nolon Nations M.D.   On: 09/23/2018 16:38     Medications:     Scheduled Medications: . allopurinol  50 mg Oral Daily  . diltiazem  360 mg Oral Daily  . finasteride  5 mg Oral QHS  . FLUoxetine  10 mg Oral Daily  . furosemide  80 mg Intravenous BID  . glipiZIDE  2.5 mg Oral QAC breakfast  . heparin  5,000 Units Subcutaneous Q8H  . insulin aspart  0-9 Units Subcutaneous TID WC  . pantoprazole  40 mg Oral Daily  . potassium chloride  40 mEq Oral Once  . pramipexole  0.75 mg Oral QHS  . spironolactone  25 mg Oral Daily    Infusions:   PRN Medications: acetaminophen **OR** acetaminophen, ALPRAZolam, ondansetron **OR** ondansetron (ZOFRAN) IV, polyethylene glycol, sodium chloride   Assessment/Plan:   1. Acute on chronic diastolic CHF: Echo with EF 60-65%, RV reportedly not markedly abnormal but PASP 59 mmHg. On exam today, he still looks volume overloaded though he has had reasonable diuresis so far in hospital.  - Responding well to lasix 80 IV bid. Volume satuts much improved. Will continue IV lasix one more day given respiratory difficulties - Continue spironolactone 25 mg daily.  - Supp K - Continue TED hose 2. CKD:  Stage 3. Creatinine stable with diuresis so far. Watch very closely given recent AKI - Creatinine 1.26 -> 1.39  Today 3. Acute hypoxic/hypercarbic respiratory failure - sats running in mid 80s-low 90s on bedside oximeter. Doesn't appear to be tracking well - CXR from 8/19 was relatively clear.  - will order ABG 4. 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 Last RHC in 6/20 showed mean PA pressure 34, PCWP 12 PVR 5.3 WU, CI 2.0 . Patient did not tolerate Adcirca.  - Currently not on any selective pulmonary vasodialtors - Per Dr. Aundra Dubin, can consider ambrisentan in future based on RHC findings (was planned by his [facility] physician).  5. H/o MAT/PVCs: Follow on telemetry. - Rates improved today after cardizem increased. May still need low dose b-blocker 6. DM2 - Consider SGLT2i if renal function stable 7.  Hypokalemia - will supp 8. Hypomag - supp   Length of Stay: 4   Daniel Bensimhon MD 09/24/2018, 11:11 AM  Advanced Heart Failure Team Pager [phone] (M-F; [place])  Please contact [facility] for night-coverage after hours (4p -7a ) and weekends on [email or web address]

## 2018-09-24 NOTE — Progress Notes (Signed)
  MEWS/VS Documentation      09/24/2018 0917 09/24/2018 0949 09/24/2018 1010 09/24/2018 1109   MEWS Score:  _0 MEWS Score Color:  Yellow  Yellow  Yellow  Yellow   Resp:  (!) 28  (!) 35  (!) 22  -   Pulse:  -  -  (!) 101  (!) 102   BP:  118/71  120/75  119/82  113/85   Temp:  97.9 F (36.6 C)  98.1 F (36.7 C)  98.4 F (36.9 C)  98.6 F (37 C)   O2 Device:  Nasal Cannula  Nasal Cannula  Nasal Cannula  Nasal Cannula   O2 Flow Rate (L/min):  2 L/min  2 L/min  2 L/min  2 L/min     Patient's red MEWS triggered at 0809 due to elevated HR and RR. Patient has been having an elevated HR when ambulating to bathroom, so although this was not an acute change- Dr. Reesa Chew was text paged to notify. Continuing red MEWS protocol.   Hiram Comber, RN 09/24/2018 11:28 AM

## 2018-09-25 LAB — CBC
HCT: 44.3 % (ref 39.0–52.0)
Hemoglobin: 13.9 g/dL (ref 13.0–17.0)
MCH: 29 pg (ref 26.0–34.0)
MCHC: 31.4 g/dL (ref 30.0–36.0)
MCV: 92.3 fL (ref 80.0–100.0)
Platelets: 250 10*3/uL (ref 150–400)
RBC: 4.8 MIL/uL (ref 4.22–5.81)
RDW: 16.2 % — ABNORMAL HIGH (ref 11.5–15.5)
WBC: 5.4 10*3/uL (ref 4.0–10.5)
nRBC: 0 % (ref 0.0–0.2)

## 2018-09-25 LAB — BASIC METABOLIC PANEL
Anion gap: 11 (ref 5–15)
BUN: 22 mg/dL (ref 8–23)
CO2: 31 mmol/L (ref 22–32)
Calcium: 8.7 mg/dL — ABNORMAL LOW (ref 8.9–10.3)
Chloride: 95 mmol/L — ABNORMAL LOW (ref 98–111)
Creatinine, Ser: 1.74 mg/dL — ABNORMAL HIGH (ref 0.61–1.24)
GFR calc Af Amer: 41 mL/min — ABNORMAL LOW (ref >60)
GFR calc non Af Amer: 35 mL/min — ABNORMAL LOW (ref >60)
Glucose, Bld: 115 mg/dL — ABNORMAL HIGH (ref 70–99)
Potassium: 4.3 mmol/L (ref 3.5–5.1)
Sodium: 137 mmol/L (ref 135–145)

## 2018-09-25 LAB — GLUCOSE, CAPILLARY
Glucose-Capillary: 162 mg/dL — ABNORMAL HIGH (ref 70–99)
Glucose-Capillary: 113 mg/dL — ABNORMAL HIGH (ref 70–99)
Glucose-Capillary: 165 mg/dL — ABNORMAL HIGH (ref 70–99)
Glucose-Capillary: 181 mg/dL — ABNORMAL HIGH (ref 70–99)

## 2018-09-25 LAB — MAGNESIUM: Magnesium: 2 mg/dL (ref 1.7–2.4)

## 2018-09-25 MED ORDER — TORSEMIDE 20 MG PO TABS
80.0000 mg | ORAL_TABLET | Freq: Two times a day (BID) | ORAL | Status: DC
  Administered 2018-09-25 – 2018-09-26 (×2): 80 mg via ORAL
  Filled 2018-09-25 (×2): qty 4

## 2018-09-25 MED ORDER — LIVING BETTER WITH HEART FAILURE BOOK
Freq: Once | Status: AC
  Administered 2018-09-25: 17:00:00

## 2018-09-25 NOTE — Progress Notes (Signed)
PROGRESS NOTE    Ryan [name]  ZDG:[id number] DOB: 1932/01/19 DOA: 09/20/2018 PCP: Chesley Noon, MD   Brief Narrative:  85 year old male with history of non-Hodgkin's lymphoma status post radiation, essential hypertension, chronic hypoxic respiratory failure on 2 L nasal cannula, COPD/pulmonary fibrosis, hyperlipidemia came to the hospital with complaints of increasing lethargy and shortness of breath.  Recently his diuretics for reduced due to clinical signs of dehydration.  Upon admission he was noted to be in hypercarbic respiratory failure requiring BiPAP.  Receiving aggressive diuresis during the hospitalization.   Assessment & Plan:   Active Problems:   BPH (benign prostatic hyperplasia)   RLS (restless legs syndrome)   Lumbar radiculopathy   Asthma/COPD   Chronic diastolic CHF (congestive heart failure) (HCC)   Pulmonary hypertension (HCC)   Acute respiratory failure with hypoxia and hypercarbia (HCC)   CKD (chronic kidney disease) stage 3, GFR 30-59 ml/min (HCC)   Lethargy   Acute metabolic encephalopathy   CHF (congestive heart failure) (HCC)   Acute hypercarbic respiratory failure Acute on chronic hypoxia, currently on 3 L nasal cannula.  On 2 L at home Acute on chronic diastolic congestive heart failure with preserved ejection fraction 60 to 65%, class III -Continue low-salt diet, fluid restriction -Transition to PO torsemide 22m po bid. Stop IV lasix.  -Last echocardiogram 6/20-showed ejection fraction 60 to 65%. -Wean off oxygen. -Aldactone 12.5 mg orally daily CHF team following- appreciate input.   Acute metabolic encephalopathy, resolved -Improved. CT head - neg for acute changes.   Low back pain, chronic DDD and scoliosis -Pain control. X-ray does not show anything acute.  A fib/Atrial tachycardia, improved -Cont Cardizem to home dose of 3659mpo daily.   Mild pulmonary hypertension -This is combination of underlying cardiac disease and also  his pulmonary fibrosis.  Diabetes mellitus type 2 -Not on any home medication.  Previous visit showed hemoglobin A1c 8.6.  Continue insulin sliding scale and Accu-Chek -Better controlled on glipizide 2.5 mg daily  Chronic kidney disease stage III -Creatinine around baseline of 1.8.  Cr 1.74  History of BPH -Continue Proscar  Chronic low back pain -Pain control with Tylenol  History of depression, stable -Daily Prozac  History of gout, stable -Allopurinol  PT/OT-SNF.  I have recommended to him and family they would really benefit from skilled nursing facility, they are hesitant at this time.  DVT prophylaxis: Subcutaneous heparin Code Status: DNR Family Communication: Spoke with JaOpal Sidlesisposition Plan: Family working with [facility] and inhouse CM to arrange for dispo. Otherwise medically better.   Consultants:   CHF team  Procedures:   None  Antimicrobials:   None   Subjective: Feels better. We have recommended SNF to the patient and the wife.  She is going to discuss it with the case manager and the nursing staff at [facility] to further determine his disposition.  Review of Systems Otherwise negative except as per HPI, including: General = no fevers, chills, dizziness, malaise, fatigue HEENT/EYES = negative for pain, redness, loss of vision, double vision, blurred vision, loss of hearing, sore throat, hoarseness, dysphagia Cardiovascular= negative for chest pain, palpitation, murmurs, lower extremity swelling Respiratory/lungs= negative for shortness of breath, cough, hemoptysis, wheezing, mucus production Gastrointestinal= negative for nausea, vomiting,, abdominal pain, melena, hematemesis Genitourinary= negative for Dysuria, Hematuria, Change in Urinary Frequency MSK = Negative for arthralgia, myalgias, Back Pain, Joint swelling  Neurology= Negative for headache, seizures, numbness, tingling  Psychiatry= Negative for anxiety, depression, suicidal and  homocidal ideation  Allergy/Immunology= Medication/Food allergy as listed  Skin= Negative for Rash, lesions, ulcers, itching   Objective: Vitals:   09/24/18 1848 09/24/18 2142 09/25/18 0614 09/25/18 0913  BP:  105/79 112/85   Pulse:  64 83   Resp: 16 (!) 22 18   Temp:  97.6 F (36.4 C) (!) 97.5 F (36.4 C)   TempSrc:  Oral Oral   SpO2:  95% (!) 88% 94%  Weight:   90.9 kg   Height:        Intake/Output Summary (Last 24 hours) at 09/25/2018 1158 Last data filed at 09/25/2018 0900 Gross per 24 hour  Intake 240 ml  Output 450 ml  Net -210 ml   Filed Weights   09/22/18 0500 09/23/18 0439 09/25/18 0614  Weight: 96.2 kg 94.8 kg 90.9 kg    Examination: Constitutional: NAD, calm, comfortable, 3 L nasal cannula Eyes: PERRL, lids and conjunctivae normal ENMT: Mucous membranes are moist. Posterior pharynx clear of any exudate or lesions.Normal dentition.  Neck: normal, supple, no masses, no thyromegaly Respiratory: Bibasilar crackles Cardiovascular: Regular rate and rhythm, no murmurs / rubs / gallops.  2+ bilateral lower extremity pitting edema. 2+ pedal pulses. No carotid bruits.  Abdomen: no tenderness, no masses palpated. No hepatosplenomegaly. Bowel sounds positive.  Musculoskeletal: no clubbing / cyanosis. No joint deformity upper and lower extremities. Good ROM, no contractures. Normal muscle tone.  Skin: no rashes, lesions, ulcers. No induration Neurologic: CN 2-12 grossly intact. Sensation intact, DTR normal. Strength 4+/5 in all 4.  Psychiatric: Normal judgment and insight. Alert and oriented x 3. Normal mood.    Data Reviewed:   CBC: Recent Labs  Lab 09/21/18 0215 09/22/18 0230 09/23/18 0219 09/24/18 0327 09/25/18 0249  WBC 7.7 7.0 6.1 5.6 5.4  HGB 13.7 13.5 13.7 13.9 13.9  HCT 45.7 43.0 42.8 44.2 44.3  MCV 95.0 92.3 91.3 91.7 92.3  PLT 212 230 248 238 410   Basic Metabolic Panel: Recent Labs  Lab 09/21/18 0215 09/22/18 0230 09/23/18 0219 09/24/18 0327  09/25/18 0249  NA 138 139 138 139 137  K 4.1 3.9 3.3* 3.4* 4.3  CL 95* 95* 94* 96* 95*  CO2 32 32 _0 GLUCOSE 146* 131* 140* 121* 115*  BUN 27* 27* _1 CREATININE 1.40* 1.46* 1.26* 1.39* 1.74*  CALCIUM 8.4* 8.4* 8.4* 8.7* 8.7*  MG  --  2.0 2.1 1.8 2.0   GFR: Estimated Creatinine Clearance: 36.4 mL/min (A) (by C-G formula based on SCr of 1.74 mg/dL (H)). Liver Function Tests: No results for input(s): AST, ALT, ALKPHOS, BILITOT, PROT, ALBUMIN in the last 168 hours. No results for input(s): LIPASE, AMYLASE in the last 168 hours. No results for input(s): AMMONIA in the last 168 hours. Coagulation Profile: No results for input(s): INR, PROTIME in the last 168 hours. Cardiac Enzymes: No results for input(s): CKTOTAL, CKMB, CKMBINDEX, TROPONINI in the last 168 hours. BNP (last 3 results) No results for input(s): PROBNP in the last 8760 hours. HbA1C: No results for input(s): HGBA1C in the last 72 hours. CBG: Recent Labs  Lab 09/23/18 2127 09/24/18 0803 09/24/18 1228 09/24/18 1650 09/25/18 0828  GLUCAP 154* 120* 114* 163* 162*   Lipid Profile: No results for input(s): CHOL, HDL, LDLCALC, TRIG, CHOLHDL, LDLDIRECT in the last 72 hours. Thyroid Function Tests: No results for input(s): TSH, T4TOTAL, FREET4, T3FREE, THYROIDAB in the last 72 hours. Anemia Panel: No results for input(s): VITAMINB12, FOLATE, FERRITIN, TIBC, IRON, RETICCTPCT in the last 72 hours.  Sepsis Labs: No results for input(s): PROCALCITON, LATICACIDVEN in the last 168 hours.  Recent Results (from the past 240 hour(s))  SARS Coronavirus 2 [facility] order, Performed in [facility] hospital lab) Nasopharyngeal Nasopharyngeal Swab     Status: None   Collection Time: 09/20/18  2:32 PM   Specimen: Nasopharyngeal Swab  Result Value Ref Range Status   SARS Coronavirus 2 NEGATIVE NEGATIVE Final    Comment: (NOTE) If result is NEGATIVE SARS-CoV-2 target nucleic acids are NOT DETECTED. The SARS-CoV-2 RNA  is generally detectable in upper and lower  respiratory specimens during the acute phase of infection. The lowest  concentration of SARS-CoV-2 viral copies this assay can detect is 250  copies / mL. A negative result does not preclude SARS-CoV-2 infection  and should not be used as the sole basis for treatment or other  patient management decisions.  A negative result may occur with  improper specimen collection / handling, submission of specimen other  than nasopharyngeal swab, presence of viral mutation(s) within the  areas targeted by this assay, and inadequate number of viral copies  (<250 copies / mL). A negative result must be combined with clinical  observations, patient history, and epidemiological information. If result is POSITIVE SARS-CoV-2 target nucleic acids are DETECTED. The SARS-CoV-2 RNA is generally detectable in upper and lower  respiratory specimens dur ing the acute phase of infection.  Positive  results are indicative of active infection with SARS-CoV-2.  Clinical  correlation with patient history and other diagnostic information is  necessary to determine patient infection status.  Positive results do  not rule out bacterial infection or co-infection with other viruses. If result is PRESUMPTIVE POSTIVE SARS-CoV-2 nucleic acids MAY BE PRESENT.   A presumptive positive result was obtained on the submitted specimen  and confirmed on repeat testing.  While 2019 novel coronavirus  (SARS-CoV-2) nucleic acids may be present in the submitted sample  additional confirmatory testing may be necessary for epidemiological  and / or clinical management purposes  to differentiate between  SARS-CoV-2 and other Sarbecovirus currently known to infect humans.  If clinically indicated additional testing with an alternate test  methodology [phone]) is advised. The SARS-CoV-2 RNA is generally  detectable in upper and lower respiratory sp ecimens during the acute  phase of infection.  The expected result is Negative. Fact Sheet for Patients:  StrictlyIdeas.no Fact Sheet for Healthcare Providers: BankingDealers.co.za This test is not yet approved or cleared by the [place] FDA and has been authorized for detection and/or diagnosis of SARS-CoV-2 by FDA under an Emergency Use Authorization (EUA).  This EUA will remain in effect (meaning this test can be used) for the duration of the COVID-19 declaration under Section 564(b)(1) of the Act, 21 U.S.C. section 360bbb-3(b)(1), unless the authorization is terminated or revoked sooner. Performed at [facility], [place] [address] [place], [place] [address]          Radiology Studies: Dg Lumbar Spine 2-3 Views  Result Date: 09/23/2018 CLINICAL DATA:  Chronic low back pain EXAM: LUMBAR SPINE - 2-3 VIEW COMPARISON:  03/06/2014 FINDINGS: Rightward scoliosis centered in the mid lumbar spine. Diffuse degenerative disc and facet disease, moderate to advanced, most pronounced at L2-3 related to scoliosis. No fracture or subluxation. No change since prior study. Aortic atherosclerosis. No aneurysm. IMPRESSION: Rightward scoliosis. Advanced degenerative disc and facet disease. No acute bony abnormality. No change since prior study. Electronically Signed   By: Rolm Baptise M.D.   On: 09/23/2018 17:44  Ct Head Wo Contrast  Result Date: 09/23/2018 CLINICAL DATA:  This RN went to assess patient and found struggling to talk. Patient head was shaking and pt was experiencing dysarthria and aphasia. Noticed patient's oxygen cannula was removed and on the floor. EXAM: CT HEAD WITHOUT CONTRAST TECHNIQUE: Contiguous axial images were obtained from the base of the skull through the vertex without intravenous contrast. COMPARISON:  None. FINDINGS: Brain: Remote lacunar infarcts of the basal ganglia bilaterally. There is central and cortical atrophy. Periventricular white matter changes are  consistent with small vessel disease. There is no intra or extra-axial fluid collection or mass lesion. The basilar cisterns and ventricles have a normal appearance. There is no CT evidence for acute infarction or hemorrhage. Vascular: No hyperdense vessel or unexpected calcification. Skull: Normal. Negative for fracture or focal lesion. Sinuses/Orbits: No acute finding. Other: None IMPRESSION: 1. Atrophy and small vessel disease. 2. Remote lacunar infarcts of the basal ganglia bilaterally. 3. No evidence for acute intracranial abnormality. Electronically Signed   By: Nolon Nations M.D.   On: 09/23/2018 16:38        Scheduled Meds: . allopurinol  50 mg Oral Daily  . diltiazem  360 mg Oral Daily  . finasteride  5 mg Oral QHS  . FLUoxetine  10 mg Oral Daily  . glipiZIDE  2.5 mg Oral QAC breakfast  . heparin  5,000 Units Subcutaneous Q8H  . insulin aspart  0-9 Units Subcutaneous TID WC  . Living Better with Heart Failure Book   Does not apply Once  . pantoprazole  40 mg Oral Daily  . pramipexole  0.75 mg Oral QHS  . spironolactone  25 mg Oral Daily  . torsemide  80 mg Oral BID   Continuous Infusions:   LOS: 5 days   Time spent= 35 mins    Ankit Arsenio Loader, MD [facility]  If 7PM-7AM, please contact night-coverage [email or web address] 09/25/2018, 11:58 AM

## 2018-09-25 NOTE — Care Management Important Message (Signed)
Important Message  Patient Details  Name: Ryan [name] MRN: [id number] Date of Birth: 03/30/1931   Medicare Important Message Given:  Yes     [name] [address] 09/25/2018, 9:33 AM

## 2018-09-25 NOTE — Progress Notes (Signed)
Patient states that he will not be wearing the BIPAP this evening. Will continue to monitor patient.

## 2018-09-25 NOTE — Progress Notes (Signed)
[facility])  Hospital Liaison: RN note    Notified by Whitman Hero, CMRN of patient/family request for [facility] Palliative services at [place] after discharge.    Writer spoke with wife, [name] to initiate education related to [facility] Palliative Program.  [name] verbalized understanding of information given. Per discussion, plan is for the [facility] to follow up with [name] and schedule an appointment after patient discharges home.    Please call with any palliative related questions.  Thank you for this referral.     Farrel Gordon, RN, CCM  [place] (listed on AMION under Hospice and [place] [facility] [place])  [phone]

## 2018-09-25 NOTE — Progress Notes (Signed)
Physical Therapy Treatment Patient Details Name: Ryan [name] MRN: [id number] DOB: Aug 14, 1931 Today's Date: 09/25/2018    History of Present Illness 85 yo with history of interstitial lung disease (?NSIP vs UIP) on home oxygen, pulmonary hypertension, diastolic CHF, and prior non-Hodgkins lymphoma (pelvic mass, s/p XRT).  Pt admitted with increasing lethargy and shortness of breath.    PT Comments    Pt with improved ambulation tolerance today however remains overall deconditioned and dependent on RW and O2. Spoke at length with patient who has noted impaired processing and memory. Pt is not safe at home alone and would require 24/7 assist for safe d/c back to ILF. Recommend ST-SNF upon d/c to achieve safe mod I Level of function. Aware wife is the decision maker and desires him to return home however 24/7 assist whether that is provided by family or hired help needs to be in place for d/c home to [place]. Pt has been in the hospital several times over the last few months and is at a high falls risk. Acute PT to cont to follow.   Follow Up Recommendations  SNF     Equipment Recommendations  None recommended by PT    Recommendations for Other Services       Precautions / Restrictions Precautions Precautions: Fall Precaution Comments: impaired judgement and memory Restrictions Weight Bearing Restrictions: No    Mobility  Bed Mobility               General bed mobility comments: pt sitting EOB eating breakfast upon PT arrival  Transfers Overall transfer level: Needs assistance Equipment used: Rolling walker (2 wheeled) Transfers: Sit to/from Stand Sit to Stand: Min guard         General transfer comment: verbal cues to push up from bed, min guard for safety but no physical assist required  Ambulation/Gait Ambulation/Gait assistance: Min guard Gait Distance (Feet): 50 Feet Assistive device: Rolling walker (2 wheeled) Gait Pattern/deviations: Step-through  pattern;Decreased step length - right;Decreased step length - left;Shuffle Gait velocity: slow Gait velocity interpretation: <1.31 ft/sec, indicative of household ambulator General Gait Details: pt reports feeling better but then states "In my experience I feel good and then I dont and I fall." Pt's SPO2 at 93% on 2LO2 [name]   Stairs             Wheelchair Mobility    Modified Rankin (Stroke Patients Only)       Balance Overall balance assessment: Needs assistance Sitting-balance support: Feet supported;No upper extremity supported Sitting balance-Leahy Scale: Good     Standing balance support: Bilateral upper extremity supported Standing balance-Leahy Scale: Poor Standing balance comment: requires UE support. C/o leg fatigue in standing.                            Cognition Arousal/Alertness: Awake/alert Behavior During Therapy: WFL for tasks assessed/performed Overall Cognitive Status: Impaired/Different from baseline(however in talking with MD appears this may be his baseline) Area of Impairment: Following commands;Safety/judgement;Awareness                   Current Attention Level: Selective Memory: Decreased short-term memory Following Commands: Follows one step commands with increased time;Follows multi-step commands inconsistently   Awareness: Emergent Problem Solving: Slow processing;Requires verbal cues;Requires tactile cues;Difficulty sequencing General Comments: pt with improved safety awareness stating he doesn't feel comfortable at home alone and would prefer someone 24/7. Per patient he states "Its been getting harder for me  to recall the date and day of the week"      Exercises      General Comments General comments (skin integrity, edema, etc.): discussed at length with patient regarding d/c planning. pt states he wants someone with him all the time, PT agrees however when SNF was brought up pt stated that his wife said he can't go  there due to the virus. spoke at length with Dr. Reesa Chew regarding d/c recs as well      Pertinent Vitals/Pain Pain Assessment: 0-10 Pain Score: 5  Pain Location: back however chornic Pain Descriptors / Indicators: Aching Pain Intervention(s): Limited activity within patient's tolerance    Home Living                      Prior Function            PT Goals (current goals can now be found in the care plan section) Acute Rehab PT Goals Patient Stated Goal: go home Progress towards PT goals: Progressing toward goals    Frequency    Min 3X/week      PT Plan Discharge plan needs to be updated    Co-evaluation              AM-PAC PT "6 Clicks" Mobility   Outcome Measure  Help needed turning from your back to your side while in a flat bed without using bedrails?: A Little Help needed moving from lying on your back to sitting on the side of a flat bed without using bedrails?: A Little Help needed moving to and from a bed to a chair (including a wheelchair)?: A Little Help needed standing up from a chair using your arms (e.g., wheelchair or bedside chair)?: A Little Help needed to walk in hospital room?: A Little Help needed climbing 3-5 steps with a railing? : A Lot 6 Click Score: 17    End of Session Equipment Utilized During Treatment: Gait belt;Oxygen Activity Tolerance: Patient tolerated treatment well Patient left: in chair;with call bell/phone within reach;with chair alarm set Nurse Communication: Mobility status PT Visit Diagnosis: Unsteadiness on feet (R26.81);Other abnormalities of gait and mobility (R26.89);Muscle weakness (generalized) (M62.81);History of falling (Z91.81);Difficulty in walking, not elsewhere classified (R26.2) Pain - Right/Left: Right     Time: 3378-0108 PT Time Calculation (min) (ACUTE ONLY): 28 min  Charges:  $Gait Training: 23-37 mins                     Kittie Plater, PT, DPT Acute Rehabilitation Services Pager #:  [phone] Office #: [phone]    Berline Lopes 09/25/2018, 9:46 AM

## 2018-09-25 NOTE — Progress Notes (Addendum)
Advanced Heart Failure Rounding Note   Subjective:    Yesterday diuresed with IV lasix. Weight trending down. Creatinine trending up.   Feeling better. Says incentive spirometer has helped.   Objective:   Weight Range:  Vital Signs:   Temp:  [97.5 F (36.4 C)-98.9 F (37.2 C)] 97.5 F (36.4 C) (08/24 0614) Pulse Rate:  [64-102] 83 (08/24 0614) Resp:  [15-35] 18 (08/24 0614) BP: (101-121)/(68-85) 112/85 (08/24 0614) SpO2:  [88 %-98 %] 88 % (08/24 0614) Weight:  [90.9 kg] 90.9 kg (08/24 0614) Last BM Date: 09/23/18  Weight change: Filed Weights   09/22/18 0500 09/23/18 0439 09/25/18 0614  Weight: 96.2 kg 94.8 kg 90.9 kg    Intake/Output:   Intake/Output Summary (Last 24 hours) at 09/25/2018 0818 Last data filed at 09/24/2018 1249 Gross per 24 hour  Intake 360 ml  Output 1500 ml  Net -1140 ml     Physical Exam: General:  Sitting in the chair. . No resp difficulty HEENT: normal Neck: supple. JVP 5-6. Carotids 2+ bilat; no bruits. No lymphadenopathy or thryomegaly appreciated. Cor: PMI nondisplaced. Regular rate & rhythm. No rubs, gallops or murmurs. Lungs: RLL LLL crackles 2 liters.  Abdomen: soft, nontender, distended. No hepatosplenomegaly. No bruits or masses. Good bowel sounds. Extremities: no cyanosis, clubbing, rash, edema Neuro: alert & orientedx3, cranial nerves grossly intact. moves all 4 extremities w/o difficulty. Affect pleasant   Telemetry: wandering atrial pacemaker 90-100s personally reviewed.    Labs: Basic Metabolic Panel: Recent Labs  Lab 09/21/18 0215 09/22/18 0230 09/23/18 0219 09/24/18 0327 09/25/18 0249  NA 138 139 138 139 137  K 4.1 3.9 3.3* 3.4* 4.3  CL 95* 95* 94* 96* 95*  CO2 32 32 _0 GLUCOSE 146* 131* 140* 121* 115*  BUN 27* 27* _1 CREATININE 1.40* 1.46* 1.26* 1.39* 1.74*  CALCIUM 8.4* 8.4* 8.4* 8.7* 8.7*  MG  --  2.0 2.1 1.8 2.0    Liver Function Tests: No results for input(s): AST, ALT, ALKPHOS,  BILITOT, PROT, ALBUMIN in the last 168 hours. No results for input(s): LIPASE, AMYLASE in the last 168 hours. No results for input(s): AMMONIA in the last 168 hours.  CBC: Recent Labs  Lab 09/21/18 0215 09/22/18 0230 09/23/18 0219 09/24/18 0327 09/25/18 0249  WBC 7.7 7.0 6.1 5.6 5.4  HGB 13.7 13.5 13.7 13.9 13.9  HCT 45.7 43.0 42.8 44.2 44.3  MCV 95.0 92.3 91.3 91.7 92.3  PLT 212 230 248 238 250    Cardiac Enzymes: No results for input(s): CKTOTAL, CKMB, CKMBINDEX, TROPONINI in the last 168 hours.  BNP: BNP (last 3 results) Recent Labs    09/07/18 1241 09/20/18 1730 09/22/18 0230  BNP 40.6 86.2 104.3*    ProBNP (last 3 results) No results for input(s): PROBNP in the last 8760 hours.    Other results:  Imaging: Dg Lumbar Spine 2-3 Views  Result Date: 09/23/2018 CLINICAL DATA:  Chronic low back pain EXAM: LUMBAR SPINE - 2-3 VIEW COMPARISON:  03/06/2014 FINDINGS: Rightward scoliosis centered in the mid lumbar spine. Diffuse degenerative disc and facet disease, moderate to advanced, most pronounced at L2-3 related to scoliosis. No fracture or subluxation. No change since prior study. Aortic atherosclerosis. No aneurysm. IMPRESSION: Rightward scoliosis. Advanced degenerative disc and facet disease. No acute bony abnormality. No change since prior study. Electronically Signed   By: Rolm Baptise M.D.   On: 09/23/2018 17:44   Ct Head Wo Contrast  Result Date: 09/23/2018  CLINICAL DATA:  This RN went to assess patient and found struggling to talk. Patient head was shaking and pt was experiencing dysarthria and aphasia. Noticed patient's oxygen cannula was removed and on the floor. EXAM: CT HEAD WITHOUT CONTRAST TECHNIQUE: Contiguous axial images were obtained from the base of the skull through the vertex without intravenous contrast. COMPARISON:  None. FINDINGS: Brain: Remote lacunar infarcts of the basal ganglia bilaterally. There is central and cortical atrophy. Periventricular  white matter changes are consistent with small vessel disease. There is no intra or extra-axial fluid collection or mass lesion. The basilar cisterns and ventricles have a normal appearance. There is no CT evidence for acute infarction or hemorrhage. Vascular: No hyperdense vessel or unexpected calcification. Skull: Normal. Negative for fracture or focal lesion. Sinuses/Orbits: No acute finding. Other: None IMPRESSION: 1. Atrophy and small vessel disease. 2. Remote lacunar infarcts of the basal ganglia bilaterally. 3. No evidence for acute intracranial abnormality. Electronically Signed   By: Nolon Nations M.D.   On: 09/23/2018 16:38     Medications:     Scheduled Medications:  allopurinol  50 mg Oral Daily   diltiazem  360 mg Oral Daily   finasteride  5 mg Oral QHS   FLUoxetine  10 mg Oral Daily   furosemide  80 mg Intravenous BID   glipiZIDE  2.5 mg Oral QAC breakfast   heparin  5,000 Units Subcutaneous Q8H   insulin aspart  0-9 Units Subcutaneous TID WC   pantoprazole  40 mg Oral Daily   pramipexole  0.75 mg Oral QHS   spironolactone  25 mg Oral Daily    Infusions:   PRN Medications: acetaminophen **OR** acetaminophen, ALPRAZolam, ondansetron **OR** ondansetron (ZOFRAN) IV, polyethylene glycol, sodium chloride, traZODone   Assessment/Plan:   1. Acute on chronic diastolic CHF: Echo with EF 60-65%, RV reportedly not markedly abnormal but PASP 59 mmHg.  -  Volume status improved. Creatinine bump noted. Stop IV lasix.  - Restart torsemide 80 mg twice daily with first dose tonight.  - Continue spironolactone 25 mg daily.  - Continue TED hose 2. CKD: Stage 3. Creatinine stable with diuresis so far. Watch very closely given recent AKI - Creatinine 1.26 -> 1.39>1.7. See above  3. Acute hypoxic/hypercarbic respiratory failure - sats running in mid 80s-low 90s on bedside oximeter. Doesn't appear to be tracking well - CXR from 8/19 was relatively clear.  - Continue IS   4. Pulmonary hypertnesion: Patient thought to have [place] out of proportion to his lung disease (ILD, NSIP versus UIP). Last RHC in 6/20 showed mean PA pressure 34, PCWP 12 PVR 5.3 WU, CI 2.0 . Patient did not tolerate Adcirca or Letairis.   - Currently not on any selective pulmonary vasodilators - Per Dr. Aundra Dubin, can consider Malvin Johns in future based on RHC findings.  5. H/o MAT/PVCs: Follow on telemetry. - Rates improved after cardizem increased. May still need low dose b-blocker 6. DM2 - Consider SGLT2i if renal function stable 7. Hypokalemia - Resolved.  8. Hypomag Mag 2.  9. OSA: Suspected.  Had home sleep study but no result yet.    Length of Stay: [place] NP-C  09/25/2018, 8:18 AM  Advanced Heart Failure Team Pager [phone] (M-F; 7a - 4p)  Please contact [facility] for night-coverage after hours (4p -7a ) and weekends on [email or web address]  Patient seen with NP, agree with the above note.   He has diuresed, creatinine up to 1.7.  Says his breathing is  better.  Has wandering atrial pacemaker.   General: NAD Neck: No JVD, no thyromegaly or thyroid nodule.  Lungs: Dry crackles at bases bilaterally.  CV: Nondisplaced PMI.  Heart irregular S1/S2, no S3/S4, no murmur.  No peripheral edema.   Abdomen: Soft, nontender, no hepatosplenomegaly, no distention.  Skin: Intact without lesions or rashes.  Neurologic: Alert and oriented x 3.  Psych: Normal affect. Extremities: No clubbing or cyanosis.  HEENT: Normal.   1. Acute on chronic diastolic CHF: Suspect with significant component of RV failure from pulmonary hypertension. Echo in 6/20 with EF 60-65%, RV not well-visualized but PASP 59 mmHg.Diuretics decreased after prior admission with AKI (in setting of hyperglycemia), but readmitted with CHF.  He has diuresed this admission, volume looks better.  Creatinine higher at 1.7. - Stop IV Lasix.   - Start torsemide 80 mg bid starting this evening, follow creatinine closely.  -  Will need very close followup after discharge.  2.AKI on CKD Stage 3: Creatinine higher today with diuresis, see above.   3. Pulmonary hypertension: Patient thought to have [place] out of proportion to his lung disease (ILD, NSIP versus UIP). RHC in 2/20 showed mean PA pressure 41, PVR 5.1 WU, CI 3. Echo in 6/20 with RV reportedly ok =>I reviewed and do not think that RV was well-visualized. V/Q scan did not suggest chronic PEs.  Patient did not tolerate Adcirca due to edema/?cognitive effects. RHC was done again in 6/20, showed moderate pulmonary arterial hypertension with PVR 5.35 (comparable to prior).  Serological workup for PH was negative. Patient started ambrisentan 5 mg and developed significant edema/volume overload despite a high dose of torsemide.  I am concerned that ambrisentan may have been part of the reason for the peripheral edema/volume overload.  - he will stay off ambrisentan (?trigger for volume overload). - Home sleep study was done, no result.  Will ask Dr. Radford Pax.  - When he is stable, will need to consider Uptravi (intolerant of Adcirca, Letairis).  4. Wandering atrial pacemaker/MAT: No definite atrial fibrillation noted.  - Continue diltiazem CD, HR controlled.  5. 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  Followed by pulmonologist as outpatient, not on antifibrotic meds.   - On home oxygen.  6. DM: Needs closer control as outpatient.  7. Deconditioning: Out of bed, needs PT.   Loralie Champagne 09/25/2018 8:55 AM

## 2018-09-25 NOTE — Progress Notes (Signed)
Nutrition Education Note  RD consulted for nutrition education regarding CHF.  Chart reviewed. Per hospice notes, plan for pt to transition to palliative care services at [facility] at discharge. Education no appropriate at this time.   Body mass index is 27.95 kg/m. Pt meets criteria for overweight based on current BMI.  Current diet order is renal/ carb modified with 1.8 L fluid restriction, patient is consuming approximately 100% of meals at this time. Labs and medications reviewed. No further nutrition interventions warranted at this time. RD contact information provided. If additional nutrition issues arise, please re-consult RD.   Ryan [name] A. Jimmye Norman, RD, LDN, [place] Registered Dietitian II Certified Diabetes Care and Education Specialist Pager: [phone] After hours Pager: [phone]

## 2018-09-25 NOTE — NC FL2 (Signed)
[place] LEVEL OF CARE SCREENING TOOL     IDENTIFICATION  Patient Name: Ryan [name] Birthdate: [birth year] Sex: male Admission Date (Current Location): 09/20/2018  [facility] and [place] Number:  Herbalist and Address:  [facility] [place], [place] [address], [place], [place] [address]      Provider Number: [id number]  Attending Physician Name and Address:  Damita Lack, MD  Relative Name and Phone Number:  [name], spouse, [phone]    Current Level of Care: Hospital Recommended Level of Care: [place] Prior Approval Number:    Date Approved/Denied:   PASRR Number:    Discharge Plan: SNF    Current Diagnoses: Patient Active Problem List   Diagnosis Date Noted  . Acute respiratory failure with hypoxia and hypercarbia ([place]) 09/20/2018  . CKD (chronic kidney disease) stage 3, GFR 30-59 ml/min (HCC) 09/20/2018  . Lethargy 09/20/2018  . Acute metabolic encephalopathy 13/24/4010  . CHF (congestive heart failure) ([place]) 09/20/2018  . Acute kidney injury superimposed on CKD ([place]) 09/10/2018  . Pulmonary hypertension ([place]) 09/10/2018  . 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 CHF exacerbation ([place]) 09/07/2018  . Leg pain 09/07/2018  . Hyperkalemia 07/26/2018  . Chronic diastolic CHF (congestive heart failure) ([place])   . Bilateral lower extremity edema 02/02/2017  . Pulmonary fibrosis ([place]) 11/22/2016  . Blurry vision, bilateral 10/26/2016  . COPD ([place]) 07/05/2016  . Exertional dyspnea 09/10/2015  . Fatigue/malaise 08/19/2015  . Asthma/COPD 02/12/2015  . 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 Hypoxemia 01/05/2014  . Essential hypertension 01/05/2014  . Hypokalemia 01/05/2014  . Urinary retention 01/05/2014  . Hypoxia   . Lumbar radiculopathy 12/31/2013  . RLS (restless legs syndrome) 09/21/2012  . Chronic lower back pain 06/15/2012  . Abdominal pain, unspecified site 05/19/2012  .  Unspecified deficiency anemia 04/07/2012  . Melanoma ([place])   . Non Hodgkin's lymphoma ([place])   . Periaortic lymphadenopathy 02/16/2011  . Barrett's esophagus 07/09/2010  . Personal history of colonic polyps 05/28/2010  . History of IBS 05/28/2010  . Diverticulosis of colon (without mention of hemorrhage) 05/28/2010  . Arthritis involving multiple sites 05/28/2010  . Wheat intolerance 05/28/2010  . Chronic venous insufficiency 08/19/2009  . PEDAL EDEMA 08/19/2009  . INSOMNIA, CHRONIC 12/24/2008  . EUSTACHIAN TUBE DYSFUNCTION, BILATERAL 12/23/2008  . ERECTILE DYSFUNCTION, ORGANIC 08/01/2008  . Allergic rhinitis with a nonallergic component 05/20/2008  . VENTRAL HERNIA 02/12/2008  . PALPITATIONS 12/25/2007  . LACTOSE INTOLERANCE 10/17/2007  . Hyperlipidemia 09/14/2006  . BPH (benign prostatic hyperplasia) 09/14/2006  . OSTEOARTHRITIS 09/14/2006  . HEART MURMUR, HX OF 09/14/2006    Orientation RESPIRATION BLADDER Height & Weight     Self, Time, Situation, Place  O2(Nasal cannula 2L, CPAP at night) Continent, External catheter Weight: 200 lb 6.4 oz (90.9 kg) Height:  _0  (180.3 cm)  BEHAVIORAL SYMPTOMS/MOOD NEUROLOGICAL BOWEL NUTRITION STATUS      Continent Diet(Please see DC Summary)  AMBULATORY STATUS COMMUNICATION OF NEEDS Skin   Limited Assist Verbally Normal                       Personal Care Assistance Level of Assistance  Bathing, Feeding, Dressing Bathing Assistance: Limited assistance Feeding assistance: Independent Dressing Assistance: Limited assistance     Functional Limitations Info  Sight, Hearing, Speech Sight Info: Impaired Hearing Info: Adequate Speech Info: Adequate    SPECIAL CARE FACTORS FREQUENCY  PT (By licensed PT), OT (By licensed  OT)     PT Frequency: 5x OT Frequency: 3x            Contractures Contractures Info: Not present    Additional Factors Info  Code Status, Allergies, Psychotropic, Insulin Sliding Scale Code Status  Info: DRN Allergies Info: Pollen Extract-tree Extract, Molds & Smuts Psychotropic Info: See DC Summary for dose Insulin Sliding Scale Info: Prozac       Current Medications (09/25/2018):  This is the current hospital active medication list Current Facility-Administered Medications  Medication Dose Route Frequency Provider Last Rate Last Dose  . acetaminophen (TYLENOL) tablet 650 mg  650 mg Oral Q6H PRN Oretha Milch D, MD   650 mg at 09/25/18 1340   Or  . acetaminophen (TYLENOL) suppository 650 mg  650 mg Rectal Q6H PRN Oretha Milch D, MD      . allopurinol (ZYLOPRIM) tablet 50 mg  50 mg Oral Daily Oretha Milch D, MD   50 mg at 09/25/18 0917  . ALPRAZolam Duanne Moron) tablet 0.25 mg  0.25 mg Oral BID PRN Arby Barrette A, NP   0.25 mg at 09/24/18 2125  . diltiazem (CARDIZEM CD) 24 hr capsule 360 mg  360 mg Oral Daily Amin, Ankit Chirag, MD   360 mg at 09/25/18 0916  . finasteride (PROSCAR) tablet 5 mg  5 mg Oral QHS Oretha Milch D, MD   5 mg at 09/24/18 2126  . FLUoxetine (PROZAC) capsule 10 mg  10 mg Oral Daily Oretha Milch D, MD   10 mg at 09/25/18 0917  . glipiZIDE (GLUCOTROL) tablet 2.5 mg  2.5 mg Oral QAC breakfast Amin, Ankit Chirag, MD   2.5 mg at 09/25/18 0918  . heparin injection 5,000 Units  5,000 Units Subcutaneous Q8H Oretha Milch D, MD   5,000 Units at 09/25/18 1341  . insulin aspart (novoLOG) injection 0-9 Units  0-9 Units Subcutaneous TID WC Oretha Milch D, MD   2 Units at 09/25/18 0919  . Living Better with Heart Failure Book   Does not apply Once Amin, Ankit Chirag, MD      . ondansetron (ZOFRAN) tablet 4 mg  4 mg Oral Q6H PRN Oretha Milch D, MD       Or  . ondansetron (ZOFRAN) injection 4 mg  4 mg Intravenous Q6H PRN Oretha Milch D, MD      . pantoprazole (PROTONIX) EC tablet 40 mg  40 mg Oral Daily Oretha Milch D, MD   40 mg at 09/25/18 0915  . polyethylene glycol (MIRALAX / GLYCOLAX) packet 17 g  17 g Oral Daily PRN Oretha Milch D, MD      .  pramipexole (MIRAPEX) tablet 0.75 mg  0.75 mg Oral QHS Oretha Milch D, MD   0.75 mg at 09/24/18 2125  . sodium chloride (OCEAN) 0.65 % nasal spray 1 spray  1 spray Each Nare PRN Damita Lack, MD   1 spray at 09/22/18 0600  . spironolactone (ALDACTONE) tablet 25 mg  25 mg Oral Daily Bensimhon, Shaune Pascal, MD   25 mg at 09/25/18 0916  . torsemide (DEMADEX) tablet 80 mg  80 mg Oral BID Clegg, Amy D, NP      . traZODone (DESYREL) tablet 100 mg  100 mg Oral QHS PRN Vertis Kelch, NP   100 mg at 09/24/18 2233     Discharge Medications: Please see discharge summary for a list of discharge medications.  Relevant Imaging Results:  Relevant Lab Results:   Additional Information SSN: [id number]  [id number]  Benard Halsted, LCSW

## 2018-09-25 NOTE — Progress Notes (Signed)
Pt's telemetry reading on and off Afib with RVR; pt has been flipping into this when up out of bed per report. PT to work with patient this AM - I notified telemetry so they are aware. Will monitor patient.

## 2018-09-26 LAB — CBC
HCT: 45 % (ref 39.0–52.0)
Hemoglobin: 14.3 g/dL (ref 13.0–17.0)
MCH: 28.8 pg (ref 26.0–34.0)
MCHC: 31.8 g/dL (ref 30.0–36.0)
MCV: 90.5 fL (ref 80.0–100.0)
Platelets: 257 10*3/uL (ref 150–400)
RBC: 4.97 MIL/uL (ref 4.22–5.81)
RDW: 15.9 % — ABNORMAL HIGH (ref 11.5–15.5)
WBC: 5.4 10*3/uL (ref 4.0–10.5)
nRBC: 0 % (ref 0.0–0.2)

## 2018-09-26 LAB — BASIC METABOLIC PANEL
Anion gap: 12 (ref 5–15)
BUN: 29 mg/dL — ABNORMAL HIGH (ref 8–23)
CO2: 30 mmol/L (ref 22–32)
Calcium: 8.6 mg/dL — ABNORMAL LOW (ref 8.9–10.3)
Chloride: 93 mmol/L — ABNORMAL LOW (ref 98–111)
Creatinine, Ser: 1.8 mg/dL — ABNORMAL HIGH (ref 0.61–1.24)
GFR calc Af Amer: 39 mL/min — ABNORMAL LOW (ref >60)
GFR calc non Af Amer: 34 mL/min — ABNORMAL LOW (ref >60)
Glucose, Bld: 114 mg/dL — ABNORMAL HIGH (ref 70–99)
Potassium: 3.9 mmol/L (ref 3.5–5.1)
Sodium: 135 mmol/L (ref 135–145)

## 2018-09-26 LAB — GLUCOSE, CAPILLARY
Glucose-Capillary: 112 mg/dL — ABNORMAL HIGH (ref 70–99)
Glucose-Capillary: 94 mg/dL (ref 70–99)

## 2018-09-26 LAB — MAGNESIUM: Magnesium: 2 mg/dL (ref 1.7–2.4)

## 2018-09-26 MED ORDER — TORSEMIDE 20 MG PO TABS: 80.0000 mg | ORAL_TABLET | Freq: Two times a day (BID) | ORAL | 0 refills | Status: DC

## 2018-09-26 MED ORDER — GLIPIZIDE 5 MG PO TABS: 2.5000 mg | ORAL_TABLET | Freq: Every day | ORAL | 0 refills | Status: DC

## 2018-09-26 MED ORDER — TRAZODONE HCL 100 MG PO TABS: 100.0000 mg | ORAL_TABLET | Freq: Every evening | ORAL | 0 refills | Status: DC | PRN

## 2018-09-26 MED ORDER — SPIRONOLACTONE 25 MG PO TABS: 25.0000 mg | ORAL_TABLET | Freq: Every day | ORAL | 0 refills | Status: DC

## 2018-09-26 NOTE — TOC Transition Note (Addendum)
Transition of Care [facility]) - CM/SW Discharge Note   Patient Details  Name: Ryan [name] MRN: [id number] Date of Birth: 27-Aug-1931  [facility] CM/SW Contact:  Sharin Mons, RN Phone Number: 09/26/2018, 10:13 AM   Clinical Narrative:    Patient will DC to: [facility] Anticipated DC date: 09/27/2018 Family notified: Wife Transport by: [name]  Per MD patient ready for DC. Pt will go back to [facility]. Pt/wife declined SNF placement. Pt is apart of the [facility] Program @ [facility] and will continue to receive home health services provided by [facility] on site. Wife states she going to privately pay for someone to be with pt from 8AM-3PM and 10PM -6pm.  D/C summary and home health orders will need to be faxed to [place], [phone]. [facility] will provide outpatient palliative care once d/c.  RN, patient, patient's wife aware of d/cplan. CPAP device will be delivered to pt residence by [facility]. Ambulance transport will be requested for patient when wife can receive. Wife states having a minor procedure today and it will be around 4:30 pm before she can be available. NCM made bedside nurse aware ...  RNCM will sign off for now as intervention is no longer needed. Please consult [place] again if new needs arise.  Mikiah Demond (Spouse)     [phone]             Final next level of care: Home w Home Health Services(outpatient palliative / Manufacturing engineer) Barriers to Discharge: No Barriers Identified   Patient Goals and CMS Choice Patient states their goals for this hospitalization and ongoing recovery are:: return to home with palliative care CMS Medicare.gov Compare Post Acute Care list provided to:: Patient Choice offered to / list presented to : Patient  Discharge Placement                       Discharge Plan and Services   Discharge Planning Services: CM Consult Post Acute Care Choice: Home Health           DME Arranged: Continuous positive airway pressure (CPAP) DME Agency: [employer] Date DME Agency Contacted: 09/27/18 Time DME Agency Contacted: [phone] Representative spoke with at DME Agency: Edwyna Ready HH Arranged: PT, OT([facility]) [place] Agency: Other - See comment Date HH Agency Contacted: 09/26/18 Time [place]: [phone] Representative spoke with at [place]: [place] ([place]) Interventions     Readmission Risk Interventions Readmission Risk Prevention Plan 09/26/2018 09/26/2018 09/12/2018  Transportation Screening Complete - Complete  PCP or Specialist Appt within 3-5 Days - - Complete  HRI or [place] - - Complete  Social Work Consult for [place] Planning/Counseling - - Complete  Palliative Care Screening - - Not Applicable  Medication Review Press photographer) Complete - Complete  PCP or Specialist appointment within 3-5 days of discharge Complete - -  [place] or Home Care Consult Complete - -  SW Recovery Care/Counseling Consult Complete - -  Palliative Care Screening Complete Complete -  Skilled Nursing Facility Complete Complete -  Some recent data might be hidden

## 2018-09-26 NOTE — Progress Notes (Signed)
Patient ID: Ryan [name], male   DOB: 02/27/1931, 85 y.o.   MRN: [id number]    Advanced Heart Failure Rounding Note   Subjective:    Stable today, creatinine 1.74 => 1.8.  SBP 90s-110s.  No dyspnea at rest or walking around room.   Objective:   Weight Range:  Vital Signs:   Temp:  [97.5 F (36.4 C)-98 F (36.7 C)] 98 F (36.7 C) (08/25 0534) Pulse Rate:  [47-91] 91 (08/25 0534) Resp:  [18-20] 18 (08/25 0534) BP: (94-115)/(59-83) 115/77 (08/25 0534) SpO2:  [95 %-99 %] 95 % (08/25 0534) Weight:  [90.7 kg] 90.7 kg (08/25 0534) Last BM Date: 09/24/18  Weight change: Filed Weights   09/23/18 0439 09/25/18 0614 09/26/18 0534  Weight: 94.8 kg 90.9 kg 90.7 kg    Intake/Output:   Intake/Output Summary (Last 24 hours) at 09/26/2018 2876 Last data filed at 09/26/2018 0017 Gross per 24 hour  Intake -  Output 1100 ml  Net -1100 ml     Physical Exam: General: NAD Neck: No JVD, no thyromegaly or thyroid nodule.  Lungs: Dry crackles at bases bilaterally.  CV: Nondisplaced PMI.  Heart irregular S1/S2, no S3/S4, no murmur.  No peripheral edema.   Abdomen: Soft, nontender, no hepatosplenomegaly, no distention.  Skin: Intact without lesions or rashes.  Neurologic: Alert and oriented x 3.  Psych: Normal affect. Extremities: No clubbing or cyanosis.  HEENT: Normal.   Telemetry: wandering atrial pacemakers vs NSR with PACs 90-100s personally reviewed.    Labs: Basic Metabolic Panel: Recent Labs  Lab 09/22/18 0230 09/23/18 0219 09/24/18 0327 09/25/18 0249 09/26/18 0244  NA 139 138 139 137 135  K 3.9 3.3* 3.4* 4.3 3.9  CL 95* 94* 96* 95* 93*  CO2 32 _0 GLUCOSE 131* 140* 121* 115* 114*  BUN 27* _1 29*  CREATININE 1.46* 1.26* 1.39* 1.74* 1.80*  CALCIUM 8.4* 8.4* 8.7* 8.7* 8.6*  MG 2.0 2.1 1.8 2.0 2.0    Liver Function Tests: No results for input(s): AST, ALT, ALKPHOS, BILITOT, PROT, ALBUMIN in the last 168 hours. No results for input(s): LIPASE,  AMYLASE in the last 168 hours. No results for input(s): AMMONIA in the last 168 hours.  CBC: Recent Labs  Lab 09/22/18 0230 09/23/18 0219 09/24/18 0327 09/25/18 0249 09/26/18 0244  WBC 7.0 6.1 5.6 5.4 5.4  HGB 13.5 13.7 13.9 13.9 14.3  HCT 43.0 42.8 44.2 44.3 45.0  MCV 92.3 91.3 91.7 92.3 90.5  PLT 230 248 238 250 257    Cardiac Enzymes: No results for input(s): CKTOTAL, CKMB, CKMBINDEX, TROPONINI in the last 168 hours.  BNP: BNP (last 3 results) Recent Labs    09/07/18 1241 09/20/18 1730 09/22/18 0230  BNP 40.6 86.2 104.3*    ProBNP (last 3 results) No results for input(s): PROBNP in the last 8760 hours.    Other results:  Imaging: No results found.   Medications:     Scheduled Medications: . allopurinol  50 mg Oral Daily  . diltiazem  360 mg Oral Daily  . finasteride  5 mg Oral QHS  . FLUoxetine  10 mg Oral Daily  . glipiZIDE  2.5 mg Oral QAC breakfast  . heparin  5,000 Units Subcutaneous Q8H  . insulin aspart  0-9 Units Subcutaneous TID WC  . pantoprazole  40 mg Oral Daily  . pramipexole  0.75 mg Oral QHS  . spironolactone  25 mg Oral Daily  . torsemide  80 mg Oral  BID    Infusions:   PRN Medications: acetaminophen **OR** acetaminophen, ALPRAZolam, ondansetron **OR** ondansetron (ZOFRAN) IV, polyethylene glycol, sodium chloride, traZODone   Assessment/Plan:   1. Acute on chronic diastolic CHF: Suspect with significant component of RV failure from pulmonary hypertension. Echo in 6/20 with EF 60-65%, RV not well-visualized but PASP 59 mmHg.Diuretics decreased after prior admission with AKI (in setting of hyperglycemia), but readmitted with CHF.  He has diuresed this admission, volume looks better.  Creatinine 1.74 => 1.8.  - Continue torsemide 80 mg bid.  Follow creatinine closely => BMET early next week.  - Will need very close followup after discharge.  2.AKI on CKD Stage 3: Creatinine higher today with diuresis, see above.   3.  Pulmonary hypertension: Patient thought to have Morris out of proportion to his lung disease (ILD, NSIP versus UIP). RHC in 2/20 showed mean PA pressure 41, PVR 5.1 WU, CI 3. Echo in 6/20 with RV reportedly ok =>I reviewed and do not think that RV was well-visualized. V/Q scan did not suggest chronic PEs.  Patient did not tolerate Adcirca due to edema/?cognitive effects. RHC was done again in 6/20, showed moderate pulmonary arterial hypertension with PVR 5.35 (comparable to prior).  Serological workup for PH was negative. Patient started ambrisentan 5 mg and developed significant edema/volume overload despite a high dose of torsemide.  I am concerned that ambrisentan may have been part of the reason for the peripheral edema/volume overload.  - he will stay off ambrisentan (?trigger for volume overload). - His home sleep study was nondiagnostic, needs to be repeated.  - When he is stable, will need to consider Uptravi (intolerant of Adcirca, Letairis).  4. Wandering atrial pacemaker/MAT: No definite atrial fibrillation noted.  - Continue diltiazem CD, HR controlled.  5. 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  Followed by pulmonologist as outpatient, not on antifibrotic meds.   - On home oxygen.  6. DM: Needs closer control as outpatient.  7. Disposition: Ready for discharge today.  Will need close followup in CHF clinic (1 week).  Needs BMET early next week.  Cardiac meds for home: torsemide 80 mg bid, diltiazem CD 360 mg daily, spironolactone 25 daily.   Loralie Champagne 09/26/2018 9:27 AM

## 2018-09-26 NOTE — Discharge Summary (Signed)
Physician Discharge Summary  Ryan [name] ZYS:[id number] DOB: 1932-01-24 DOA: 09/20/2018  PCP: Chesley Noon, MD  Admit date: 09/20/2018 Discharge date: 09/26/2018  Admitted [facility]: [facility] Disposition: [facility]  Recommendations for Outpatient Follow-up:  1. Follow up with PCP in 1-2 weeks 2. BMP in 1 week, follow-up outpatient heart failure team, Dr. Aundra Dubin. 3. Torsemide increased to 80 mg twice daily 4. Check blood sugars 4 times daily, 3 times before meal and before bedtime 5. Low-dose glipizide prescribed.  Discharge Condition: Stable CODE STATUS: DNR Diet recommendation: Heart healthy  Brief/Interim Summary: 85 year old male with history of non-Hodgkin's lymphoma status post radiation, essential hypertension, chronic hypoxic respiratory failure on 2 L nasal cannula, COPD/pulmonary fibrosis, hyperlipidemia came to the hospital with complaints of increasing lethargy and shortness of breath.  Recently his diuretics for reduced due to clinical signs of dehydration.  Upon admission he was noted to be in hypercarbic respiratory failure requiring BiPAP.  Receiving aggressive diuresis during the hospitalization.  During the hospitalization patient was aggressively diuresed, heart failure team was following.  Over the course of several days his symptoms improved and his oxygen was weaned down to baseline of 2 L nasal cannula.  His Aldactone was increased to 25 mg daily, torsemide was increased to 80 mg twice daily.  He was also started on glipizide due to new onset diabetes which she was doing well on. Today he is medically stable to be discharged.  Home health arrangements to be made by the case manager.  Wife was thoroughly updated.   Discharge Diagnoses:  Active Problems:   BPH (benign prostatic hyperplasia)   RLS (restless legs syndrome)   Lumbar radiculopathy   Asthma/COPD   Chronic diastolic CHF (congestive heart failure) (HCC)   Pulmonary hypertension (HCC)   Acute  respiratory failure with hypoxia and hypercarbia (HCC)   CKD (chronic kidney disease) stage 3, GFR 30-59 ml/min (HCC)   Lethargy   Acute metabolic encephalopathy   CHF (congestive heart failure) (HCC)   Acute hypercarbic respiratory failure Acute on chronic hypoxia, currently on 2 L nasal cannula.  On 2 L at home Acute on chronic diastolic congestive heart failure with preserved ejection fraction 60 to 65%, class III -Continue low-salt diet, fluid restriction.  Feeling better back to his baseline. -Torsemide 80 mg twice daily -Last echocardiogram 6/20-showed ejection fraction 60 to 65%. -Wean off oxygen. -Aldactone 25 daily CHF team following- appreciate input.  Outpatient lab work in 4-5 days  Acute metabolic encephalopathy, resolved -Improved. CT head - neg for acute changes.   Low back pain, chronic DDD and scoliosis -Pain control. X-ray does not show anything acute.  A fib/Atrial tachycardia, improved -Cont Cardizem to home dose of 381m po daily.   Mild pulmonary hypertension -This is combination of underlying cardiac disease and also his pulmonary fibrosis.  Diabetes mellitus type 2 -Previously not on any medication.  Started on glipizide 2.5 mg daily.  Doing well.  Advised to check his sugars 4 times daily and keep a log of this which can be reviewed with his outpatient primary care doctor at the next visit in 1-2 weeks.  Chronic kidney disease stage III -Creatinine around baseline of 1.8.  Cr 1.8  History of BPH -Continue Proscar  Chronic low back pain -Pain control with Tylenol  History of depression, stable -Daily Prozac  History of gout, stable -Allopurinol  Consultations:  Heart failure  Subjective: Doing well, does not have any complaints.  Ambulating with assistance from walker  Discharge  Exam: Vitals:   09/26/18 0534 09/26/18 1305  BP: 115/77 108/77  Pulse: 91 75  Resp: 18   Temp: 98 F (36.7 C) 97.6 F (36.4 C)  SpO2: 95% 96%    Vitals:   09/25/18 2329 09/25/18 2330 09/26/18 0534 09/26/18 1305  BP: 94/66  115/77 108/77  Pulse:   91 75  Resp:  19 18   Temp:   98 F (36.7 C) 97.6 F (36.4 C)  TempSrc:    Oral  SpO2:   95% 96%  Weight:   90.7 kg   Height:        General: Pt is alert, awake, not in acute distress Cardiovascular: RRR, S1/S2 +, no rubs, no gallops Respiratory: Minimal bibasilar crackles Abdominal: Soft, NT, ND, bowel sounds + Extremities: 2+ bilateral lower extremity pitting edema, no cyanosis  Discharge Instructions   Allergies as of 09/26/2018      Reactions   Pollen Extract-tree Extract Other (See Comments)   HEADACHES, TIRED , DRAINAGE FROM SINUSES   Molds & Smuts Other (See Comments)   Also dust mites causes sinus infections, h/a etc.      Medication List    TAKE these medications   allopurinol 100 MG tablet Commonly known as: ZYLOPRIM Take 50 mg by mouth daily.   diltiazem 360 MG 24 hr capsule Commonly known as: CARDIZEM CD Take 1 capsule (360 mg total) by mouth daily.   finasteride 5 MG tablet Commonly known as: PROSCAR Take 1 tablet (5 mg total) by mouth daily. What changed: when to take this   FLUoxetine 10 MG capsule Commonly known as: PROZAC Take 10 mg by mouth daily.   nystatin 100000 UNIT/ML suspension Commonly known as: MYCOSTATIN Take 5 mLs by mouth 4 (four) times daily.   omeprazole 20 MG capsule Commonly known as: PRILOSEC Take 20 mg by mouth daily.   Potassium Chloride ER 20 MEQ Tbcr Take 10 mEq by mouth daily.   pramipexole 0.75 MG tablet Commonly known as: MIRAPEX Take 0.75 mg by mouth at bedtime.   spironolactone 25 MG tablet Commonly known as: ALDACTONE Take 1 tablet (25 mg total) by mouth daily. Start taking on: September 27, 2018 What changed: how much to take   torsemide 20 MG tablet Commonly known as: DEMADEX Take 4 tablets (80 mg total) by mouth 2 (two) times daily. What changed:   how much to take  when to take this    traMADol 50 MG tablet Commonly known as: ULTRAM Take 50 mg by mouth every 12 (twelve) hours as needed for moderate pain.   traZODone 100 MG tablet Commonly known as: DESYREL Take 1 tablet (100 mg total) by mouth at bedtime as needed for sleep. What changed:   when to take this  reasons to take this   triazolam 0.25 MG tablet Commonly known as: HALCION Take 0.25 mg by mouth at bedtime as needed for sleep.            Durable Medical Equipment  (From admission, onward)         Start     Ordered   09/25/18 1234  For home use only DME continuous positive airway pressure (CPAP)  Once    Question Answer Comment  Length of Need 6 Months   Patient has OSA or probable OSA Yes   Settings 5-10   Signs and symptoms of probable OSA  (select all that apply) Moring headaches   Signs and symptoms of probable OSA  (select all that  apply) Snoring   Signs and symptoms of probable OSA  (select all that apply) Gasping during sleep   CPAP supplies needed Mask, headgear, cushions, filters, heated tubing and water chamber      09/25/18 1233         Follow-up Information    Larey Dresser, MD Follow up on 10/06/2018.   Specialty: Cardiology Why: 0900. Garage Code [id number]  Contact information: [place] [address] [phone]        Chesley Noon, MD. Schedule an appointment as soon as possible for a visit in 1 week(s).   Specialty: Family Medicine Contact information: [place] [address] [phone]        Lorretta Harp, MD .   Specialties: Cardiology, Radiology Contact information: [address] [place] [address] [phone]          Allergies  Allergen Reactions  . Pollen Extract-Tree Extract Other (See Comments)    HEADACHES, TIRED , DRAINAGE FROM SINUSES  . Molds & Smuts Other (See Comments)    Also dust mites causes sinus infections, h/a etc.    You were cared for by a hospitalist during  your hospital stay. If you have any questions about your discharge medications or the care you received while you were in the hospital after you are discharged, you can call the unit and asked to speak with the hospitalist on call if the hospitalist that took care of you is not available. Once you are discharged, your primary care physician will handle any further medical issues. Please note that no refills for any discharge medications will be authorized once you are discharged, as it is imperative that you return to your primary care physician (or establish a relationship with a primary care physician if you do not have one) for your aftercare needs so that they can reassess your need for medications and monitor your lab values.   Procedures/Studies: Dg Chest 2 View  Result Date: 09/09/2018 CLINICAL DATA:  Shortness of breath. EXAM: CHEST - 2 VIEW COMPARISON:  September 07, 2018 FINDINGS: The left hemidiaphragm is elevated. The known large hiatal hernia is seen under the left hemidiaphragm. There is adjacent atelectasis. These findings are stable. The lungs, heart, hila, and mediastinum are otherwise unchanged. IMPRESSION: Elevated left hemidiaphragm with adjacent atelectasis. Large hiatal hernia. No other abnormalities. Electronically Signed   By: Dorise Bullion III M.D   On: 09/09/2018 16:28   Dg Lumbar Spine 2-3 Views  Result Date: 09/23/2018 CLINICAL DATA:  Chronic low back pain EXAM: LUMBAR SPINE - 2-3 VIEW COMPARISON:  03/06/2014 FINDINGS: Rightward scoliosis centered in the mid lumbar spine. Diffuse degenerative disc and facet disease, moderate to advanced, most pronounced at L2-3 related to scoliosis. No fracture or subluxation. No change since prior study. Aortic atherosclerosis. No aneurysm. IMPRESSION: Rightward scoliosis. Advanced degenerative disc and facet disease. No acute bony abnormality. No change since prior study. Electronically Signed   By: Rolm Baptise M.D.   On: 09/23/2018 17:44   Ct  Head Wo Contrast  Result Date: 09/23/2018 CLINICAL DATA:  This RN went to assess patient and found struggling to talk. Patient head was shaking and pt was experiencing dysarthria and aphasia. Noticed patient's oxygen cannula was removed and on the floor. EXAM: CT HEAD WITHOUT CONTRAST TECHNIQUE: Contiguous axial images were obtained from the base of the skull through the vertex without intravenous contrast. COMPARISON:  None. FINDINGS: Brain: Remote lacunar infarcts of the  basal ganglia bilaterally. There is central and cortical atrophy. Periventricular white matter changes are consistent with small vessel disease. There is no intra or extra-axial fluid collection or mass lesion. The basilar cisterns and ventricles have a normal appearance. There is no CT evidence for acute infarction or hemorrhage. Vascular: No hyperdense vessel or unexpected calcification. Skull: Normal. Negative for fracture or focal lesion. Sinuses/Orbits: No acute finding. Other: None IMPRESSION: 1. Atrophy and small vessel disease. 2. Remote lacunar infarcts of the basal ganglia bilaterally. 3. No evidence for acute intracranial abnormality. Electronically Signed   By: Nolon Nations M.D.   On: 09/23/2018 16:38   Nm Pulmonary Perfusion  Result Date: 09/09/2018 CLINICAL DATA:  Pulmonary fibrosis.  Shortness of breath. EXAM: NUCLEAR MEDICINE PERFUSION LUNG SCAN TECHNIQUE: Perfusion images were obtained in multiple projections after intravenous injection of radiopharmaceutical. Ventilation scans intentionally deferred if perfusion scan and chest x-ray adequate for interpretation during COVID 19 epidemic. RADIOPHARMACEUTICALS:  1.5 mCi Tc-56mMAA IV COMPARISON:  Chest x-ray September 09, 2018. CT of the abdomen and pelvis March 08, 2017. FINDINGS: A defect over the left base correlates with the large hiatal hernia seen on previous CT imaging. No suspicious defects identified. IMPRESSION: No evidence of pulmonary embolus. Electronically Signed    By: DDorise BullionIII M.D   On: 09/09/2018 16:27   Dg Chest Portable 1 View  Result Date: 09/20/2018 CLINICAL DATA:  Shortness of breath. EXAM: PORTABLE CHEST 1 VIEW COMPARISON:  Radiographs of September 09, 2018. FINDINGS: Stable cardiomegaly. No pneumothorax is noted. Stable elevated left hemidiaphragm. Mild bibasilar subsegmental atelectasis is noted. No significant pleural effusion is noted. Bony thorax is unremarkable. IMPRESSION: Stable elevated left hemidiaphragm. Mild bibasilar subsegmental atelectasis. Electronically Signed   By: JMarijo ConceptionM.D.   On: 09/20/2018 13:20   Dg Chest Port 1 View  Result Date: 09/07/2018 CLINICAL DATA:  Shortness of breath EXAM: PORTABLE CHEST 1 VIEW COMPARISON:  July 19, 2018. FINDINGS: Again noted is cardiomegaly with a hiatal hernia. There is streaky subsegmental atelectasis seen at the left lung base as on the prior exam. No new airspace consolidation. No acute osseous abnormality. IMPRESSION: No acute cardiopulmonary process. Hiatal hernia Mild cardiomegaly Electronically Signed   By: BPrudencio PairM.D.   On: 09/07/2018 13:30      The results of significant diagnostics from this hospitalization (including imaging, microbiology, ancillary and laboratory) are listed below for reference.     Microbiology: Recent Results (from the past 240 hour(s))  SARS Coronavirus 2 ([facility] Nasopharyngeal Nasopharyngeal Swab     Status: None   Collection Time: 09/20/18  2:32 PM   Specimen: Nasopharyngeal Swab  Result Value Ref Range Status   SARS Coronavirus 2 NEGATIVE NEGATIVE Final    Comment: (NOTE) If result is NEGATIVE SARS-CoV-2 target nucleic acids are NOT DETECTED. The SARS-CoV-2 RNA is generally detectable in upper and lower  respiratory specimens during the acute phase of infection. The lowest  concentration of SARS-CoV-2 viral copies this assay can detect is 250  copies / mL. A negative result does not  preclude SARS-CoV-2 infection  and should not be used as the sole basis for treatment or other  patient management decisions.  A negative result may occur with  improper specimen collection / handling, submission of specimen other  than nasopharyngeal swab, presence of viral mutation(s) within the  areas targeted by this assay, and inadequate number of viral copies  (<250 copies / mL). A  negative result must be combined with clinical  observations, patient history, and epidemiological information. If result is POSITIVE SARS-CoV-2 target nucleic acids are DETECTED. The SARS-CoV-2 RNA is generally detectable in upper and lower  respiratory specimens dur ing the acute phase of infection.  Positive  results are indicative of active infection with SARS-CoV-2.  Clinical  correlation with patient history and other diagnostic information is  necessary to determine patient infection status.  Positive results do  not rule out bacterial infection or co-infection with other viruses. If result is PRESUMPTIVE POSTIVE SARS-CoV-2 nucleic acids MAY BE PRESENT.   A presumptive positive result was obtained on the submitted specimen  and confirmed on repeat testing.  While 2019 novel coronavirus  (SARS-CoV-2) nucleic acids may be present in the submitted sample  additional confirmatory testing may be necessary for epidemiological  and / or clinical management purposes  to differentiate between  SARS-CoV-2 and other Sarbecovirus currently known to infect humans.  If clinically indicated additional testing with an alternate test  methodology [phone]) is advised. The SARS-CoV-2 RNA is generally  detectable in upper and lower respiratory sp ecimens during the acute  phase of infection. The expected result is Negative. Fact Sheet for Patients:  StrictlyIdeas.no Fact Sheet for Healthcare Providers: BankingDealers.co.za This test is not yet approved or cleared by  the [place] FDA and has been authorized for detection and/or diagnosis of SARS-CoV-2 by FDA under an Emergency Use Authorization (EUA).  This EUA will remain in effect (meaning this test can be used) for the duration of the COVID-19 declaration under Section 564(b)(1) of the Act, 21 U.S.C. section 360bbb-3(b)(1), unless the authorization is terminated or revoked sooner. Performed at [facility], [place] [address], [place], [place] [address]      Labs: BNP (last 3 results) Recent Labs    09/07/18 1241 09/20/18 1730 09/22/18 0230  BNP 40.6 86.2 944.9*   Basic Metabolic Panel: Recent Labs  Lab 09/22/18 0230 09/23/18 0219 09/24/18 0327 09/25/18 0249 09/26/18 0244  NA 139 138 139 137 135  K 3.9 3.3* 3.4* 4.3 3.9  CL 95* 94* 96* 95* 93*  CO2 32 _0 GLUCOSE 131* 140* 121* 115* 114*  BUN 27* _1 29*  CREATININE 1.46* 1.26* 1.39* 1.74* 1.80*  CALCIUM 8.4* 8.4* 8.7* 8.7* 8.6*  MG 2.0 2.1 1.8 2.0 2.0   Liver Function Tests: No results for input(s): AST, ALT, ALKPHOS, BILITOT, PROT, ALBUMIN in the last 168 hours. No results for input(s): LIPASE, AMYLASE in the last 168 hours. No results for input(s): AMMONIA in the last 168 hours. CBC: Recent Labs  Lab 09/22/18 0230 09/23/18 0219 09/24/18 0327 09/25/18 0249 09/26/18 0244  WBC 7.0 6.1 5.6 5.4 5.4  HGB 13.5 13.7 13.9 13.9 14.3  HCT 43.0 42.8 44.2 44.3 45.0  MCV 92.3 91.3 91.7 92.3 90.5  PLT 230 248 238 250 257   Cardiac Enzymes: No results for input(s): CKTOTAL, CKMB, CKMBINDEX, TROPONINI in the last 168 hours. BNP: Invalid input(s): POCBNP CBG: Recent Labs  Lab 09/25/18 1217 09/25/18 1655 09/25/18 2133 09/26/18 0804 09/26/18 1303  GLUCAP 113* 165* 181* 112* 94   D-Dimer No results for input(s): DDIMER in the last 72 hours. Hgb A1c No results for input(s): HGBA1C in the last 72 hours. Lipid Profile No results for input(s): CHOL, HDL, LDLCALC, TRIG, CHOLHDL, LDLDIRECT in the last 72  hours. Thyroid function studies No results for input(s): TSH, T4TOTAL, T3FREE, THYROIDAB in the last 72  hours.  Invalid input(s): FREET3 Anemia work up No results for input(s): VITAMINB12, FOLATE, FERRITIN, TIBC, IRON, RETICCTPCT in the last 72 hours. Urinalysis    Component Value Date/Time   COLORURINE STRAW (A) 09/07/2018 1241   APPEARANCEUR CLEAR 09/07/2018 1241   LABSPEC 1.009 09/07/2018 1241   LABSPEC 1.025 06/28/2013 1321   PHURINE 6.0 09/07/2018 1241   GLUCOSEU 50 (A) 09/07/2018 1241   GLUCOSEU Negative 06/28/2013 1321   HGBUR NEGATIVE 09/07/2018 1241   BILIRUBINUR NEGATIVE 09/07/2018 1241   BILIRUBINUR Negative 06/28/2013 1321   KETONESUR NEGATIVE 09/07/2018 1241   PROTEINUR NEGATIVE 09/07/2018 1241   UROBILINOGEN 1.0 01/04/2014 2156   UROBILINOGEN 0.2 06/28/2013 1321   NITRITE NEGATIVE 09/07/2018 1241   LEUKOCYTESUR NEGATIVE 09/07/2018 1241   LEUKOCYTESUR Negative 06/28/2013 1321   Sepsis Labs Invalid input(s): PROCALCITONIN,  WBC,  LACTICIDVEN Microbiology Recent Results (from the past 240 hour(s))  SARS Coronavirus 2 [facility] order, Performed in [facility] hospital lab) Nasopharyngeal Nasopharyngeal Swab     Status: None   Collection Time: 09/20/18  2:32 PM   Specimen: Nasopharyngeal Swab  Result Value Ref Range Status   SARS Coronavirus 2 NEGATIVE NEGATIVE Final    Comment: (NOTE) If result is NEGATIVE SARS-CoV-2 target nucleic acids are NOT DETECTED. The SARS-CoV-2 RNA is generally detectable in upper and lower  respiratory specimens during the acute phase of infection. The lowest  concentration of SARS-CoV-2 viral copies this assay can detect is 250  copies / mL. A negative result does not preclude SARS-CoV-2 infection  and should not be used as the sole basis for treatment or other  patient management decisions.  A negative result may occur with  improper specimen collection / handling, submission of specimen other  than nasopharyngeal swab, presence of  viral mutation(s) within the  areas targeted by this assay, and inadequate number of viral copies  (<250 copies / mL). A negative result must be combined with clinical  observations, patient history, and epidemiological information. If result is POSITIVE SARS-CoV-2 target nucleic acids are DETECTED. The SARS-CoV-2 RNA is generally detectable in upper and lower  respiratory specimens dur ing the acute phase of infection.  Positive  results are indicative of active infection with SARS-CoV-2.  Clinical  correlation with patient history and other diagnostic information is  necessary to determine patient infection status.  Positive results do  not rule out bacterial infection or co-infection with other viruses. If result is PRESUMPTIVE POSTIVE SARS-CoV-2 nucleic acids MAY BE PRESENT.   A presumptive positive result was obtained on the submitted specimen  and confirmed on repeat testing.  While 2019 novel coronavirus  (SARS-CoV-2) nucleic acids may be present in the submitted sample  additional confirmatory testing may be necessary for epidemiological  and / or clinical management purposes  to differentiate between  SARS-CoV-2 and other Sarbecovirus currently known to infect humans.  If clinically indicated additional testing with an alternate test  methodology [phone]) is advised. The SARS-CoV-2 RNA is generally  detectable in upper and lower respiratory sp ecimens during the acute  phase of infection. The expected result is Negative. Fact Sheet for Patients:  StrictlyIdeas.no Fact Sheet for Healthcare Providers: BankingDealers.co.za This test is not yet approved or cleared by the [place] FDA and has been authorized for detection and/or diagnosis of SARS-CoV-2 by FDA under an Emergency Use Authorization (EUA).  This EUA will remain in effect (meaning this test can be used) for the duration of the COVID-19 declaration under Section  564(b)(1) of the  Act, 21 U.S.C. section 360bbb-3(b)(1), unless the authorization is terminated or revoked sooner. Performed at [facility], [place] [address], [place], [place] [address]      Time coordinating discharge:  I have spent 35 minutes face to face with the patient and on the ward discussing the patients care, assessment, plan and disposition with other care givers. >50% of the time was devoted counseling the patient about the risks and benefits of treatment/Discharge disposition and coordinating care.   SIGNED:   Damita Lack, MD  [facility] 09/26/2018, 1:14 PM   If 7PM-7AM, please contact night-coverage [email or web address]

## 2018-09-26 NOTE — Progress Notes (Addendum)
Pt discharge no need for PTAR transport AVS given and discussed tele and IV removed Pt and wife had all questions answered

## 2018-09-27 ENCOUNTER — Telehealth: Payer: Self-pay

## 2018-09-27 ENCOUNTER — Other Ambulatory Visit: Payer: Medicare HMO | Admitting: Adult Health Nurse Practitioner

## 2018-09-27 ENCOUNTER — Other Ambulatory Visit: Payer: Self-pay

## 2018-09-27 DIAGNOSIS — Z515 Encounter for palliative care: Secondary | ICD-10-CM

## 2018-09-27 NOTE — Progress Notes (Signed)
[facility] Consult Note Telephone: [phone]  Fax: [phone]  PATIENT NAME: Ryan [name] DOB: 1931-12-23 MRN: [id number]  PRIMARY CARE PROVIDER:   Chesley Noon, MD  REFERRING PROVIDER:  Chesley Noon, MD [place],  [place] [address]  RESPONSIBLE PARTY:   Ryan [name], wife [phone]  Due to the COVID-19 crisis, this visit was done via telemedicine and it was initiated and consent by this patient and or family. Video-audio (telehealth) contact was unable to be done due to technical barriers from the patient's side.    RECOMMENDATIONS and PLAN:  1.  Advanced care planning.  Patient is a DNR.  Spoke with wife who would like to keep him in the [place] where he is familiar with the environment and the staff.  States that he does not have dementia but does get confused at times.  Stating that it is getting harder to take him out for doctor's visits.  Discussed transitioning his primary care needs to someone like Ryan [name] that can come to the facility.  She liked this idea.  Reached out to Associate Professor at facility and she said this can be done online or with a packet she has at the facility.  Had RN coordinator reach out to wife with info on Ryan [name]. She is on board with hospice when it is necessary.     2.  CHF.  Patient has been in [name] 3 times over the past couple of months due to fluid overload.  He just came home from his last [name] stay yesterday.  He is currently on lasix 80 mg BID. Wife states that he is very weak with increased SOB.  Is not currently on participating in any PT/OT.  Does have respiratory therapist coming today to set up and show patient and staff how to use CPaP. Discussed with wife that it might be best if I went to see him in person to evaluate for signs and symptoms of fluid overload.  Have appointment with him tomorrow.    I spent 45 minutes providing this consultation,  from 9:00 to 9:45.  More than 50% of the time in this consultation was spent coordinating communication.   HISTORY OF PRESENT ILLNESS:  Ryan [name] is a 85 y.o. year old male with multiple medical problems including CHF, COPD, pulmonary fibrosis, HTN, non hodgkin's lymphoma status post radiation. Palliative Care was asked to help address goals of care.   CODE STATUS: DNR  PPS: 0% HOSPICE ELIGIBILITY/DIAGNOSIS: TBD  PAST MEDICAL HISTORY:  Past Medical History:  Diagnosis Date  . Asthma   . Barrett's esophagus   . Benign prostatic hypertrophy   . Bilateral lower extremity edema   . CHF (congestive heart failure) ([place])   . Chronic fatigue   . COPD (chronic obstructive pulmonary disease) ([place])   . Diverticulosis of colon (without mention of hemorrhage)   . Esophageal reflux   . Gastritis   . Gluten intolerance   . Hiatal hernia 02/10/11   Noted on CT Scan - Moderate Hiatal Hernia  . Hyperlipemia   . Hypertension   . Lymphoma of lymph nodes in pelvis ([place]) 03/03/2011    Large Right Retroperitoneal Mass  . Melanoma ([place])    lymphoma  . Microcytic anemia 04/20/2011   . 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 Osteoarthritis    hands/feet,knees, NECK, BACK  . Periaortic lymphadenopathy 02/16/2011  .  Personal history of colonic polyps 2004   hyperplastic Dr. Collene Mares  . Renal cyst 02/10/11    Noted on CT Scan - Bilateral Renal Cysts  . S/P radiation therapy 03/15/11 - 04/09/11   Abdominal/ Pelvic Tumor, 3600 cGy/20 Fractions    SOCIAL HX:  Social History   Tobacco Use  . Smoking status: Former Smoker    Packs/day: 1.00    Years: 35.00    Pack years: 35.00    Types: Pipe, Cigarettes    Quit date: 02/23/1992    Years since quitting: 26.6  . Smokeless tobacco: Never Used  . Tobacco comment: quit 20 years ago  Substance Use Topics  . Alcohol use: Yes    Comment: 2 drinks daily scotch  AND WINE WITH SUPPER    ALLERGIES:  Allergies  Allergen  Reactions  . Pollen Extract-Tree Extract Other (See Comments)    HEADACHES, TIRED , DRAINAGE FROM SINUSES  . Molds & Smuts Other (See Comments)    Also dust mites causes sinus infections, h/a etc.     PERTINENT MEDICATIONS:  Outpatient Encounter Medications as of 09/27/2018  Medication Sig  . allopurinol (ZYLOPRIM) 100 MG tablet Take 50 mg by mouth daily.  Marland Kitchen diltiazem (CARDIZEM CD) 360 MG 24 hr capsule Take 1 capsule (360 mg total) by mouth daily.  . finasteride (PROSCAR) 5 MG tablet Take 1 tablet (5 mg total) by mouth daily. (Patient taking differently: Take 5 mg by mouth at bedtime. )  . FLUoxetine (PROZAC) 10 MG capsule Take 10 mg by mouth daily.  Marland Kitchen glipiZIDE (GLUCOTROL) 5 MG tablet Take 0.5 tablets (2.5 mg total) by mouth daily before breakfast.  . nystatin (MYCOSTATIN) 100000 UNIT/ML suspension Take 5 mLs by mouth 4 (four) times daily.  Marland Kitchen omeprazole (PRILOSEC) 20 MG capsule Take 20 mg by mouth daily.  . potassium chloride 20 MEQ TBCR Take 10 mEq by mouth daily.  . pramipexole (MIRAPEX) 0.75 MG tablet Take 0.75 mg by mouth at bedtime.  Marland Kitchen spironolactone (ALDACTONE) 25 MG tablet Take 1 tablet (25 mg total) by mouth daily.  Marland Kitchen torsemide (DEMADEX) 20 MG tablet Take 4 tablets (80 mg total) by mouth 2 (two) times daily.  . traMADol (ULTRAM) 50 MG tablet Take 50 mg by mouth every 12 (twelve) hours as needed for moderate pain.   . traZODone (DESYREL) 100 MG tablet Take 1 tablet (100 mg total) by mouth at bedtime as needed for sleep.  . triazolam (HALCION) 0.25 MG tablet Take 0.25 mg by mouth at bedtime as needed for sleep.    No facility-administered encounter medications on file as of 09/27/2018.        Jhamari Markowicz Jenetta Downer, NP

## 2018-09-27 NOTE — Telephone Encounter (Signed)
At the direction of Palliative NP, phone call placed to patient's wife to provide requested information. VM left

## 2018-09-28 ENCOUNTER — Non-Acute Institutional Stay: Payer: Medicare HMO | Admitting: Adult Health Nurse Practitioner

## 2018-09-28 DIAGNOSIS — Z515 Encounter for palliative care: Secondary | ICD-10-CM

## 2018-09-29 ENCOUNTER — Other Ambulatory Visit: Payer: Self-pay

## 2018-09-29 NOTE — Progress Notes (Signed)
[facility] Consult Note Telephone: [phone]  Fax: [phone]  PATIENT NAME: Ryan [name] DOB: Mar 07, 1931 MRN: [id number]  PRIMARY CARE PROVIDER:   Chesley Noon, MD  REFERRING PROVIDER:  Chesley Noon, MD [address] [place],  [place] [address]  RESPONSIBLE PARTY:   Ramirez Fullbright, wife [phone]    RECOMMENDATIONS and PLAN:  1.  Advanced care planning.  Patient is a DNR.  No changes made today.  2.  CHF.  Patient does get DOE but feels like it has improved since being in the hospital.  Denies cough, chest pain.  Does get tired easy and takes frequent breaks.  Patient is wearing TED hose today and does have some edema bilaterally, see assessment below.  Though he still has edema, he states that it was worse.     He did have question about how long each day to wear his TED hose.  Instructed him that he can put them on when he first gets up in the morning and take them off when he goes to bed.  He seems to be stable right now and have appointment in one week to continue to evaluate effectiveness of current dose of torsemide of 54m BID.    3.  COPD/Pulmonary fibrosis.  Patient was instructed on use of CPaP machine and states that it is taking some getting used to.  He states that he plans on practicing with it during naps.  He uses his incentive spirometer and states that times he can bring it up to 1250.  Encouraged to continue practicing with the CPaP as it can take some getting used to and continue with his incentive spirometer  4.  DMT2.  Patient has new onset of diabetes and was started on glipizide 2.5 mg daily.  This seems to be helping as his blood sugars prior to his last hospitalization were in the 300s and now they are down into the 100s and a few 200s.  Encouraged to limit his juice intake and have smaller more frequent meals.     I spent 40 minutes providing this consultation,  from 1:30 to 2:10. More than 50%  of the time in this consultation was spent coordinating communication.   HISTORY OF PRESENT ILLNESS:  Ryan VAQUERAis a 85y.o. year old male with multiple medical problems including CHF, COPD, pulmonary fibrosis, HTN, non hodgkin's lymphoma status post radiation. Palliative Care was asked to help address goals of care.   CODE STATUS: DNR  PPS: 50% HOSPICE ELIGIBILITY/DIAGNOSIS: TBD  PHYSICAL EXAM:   General: NAD, frail appearing Cardiovascular: regular rate and rhythm Pulmonary: lung sounds somewhat diminished but no abnormal sounds heard; normal respiratory effort Abdomen: soft, nontender, + bowel sounds GU: no suprapubic tenderness Extremities: Patient is wearing TED hose but can still notice some edema bilaterally to mid shin with more in the left than the right Skin: no rashes Neurological: Weakness but otherwise nonfocal   PAST MEDICAL HISTORY:  Past Medical History:  Diagnosis Date  . Asthma   . Barrett's esophagus   . Benign prostatic hypertrophy   . Bilateral lower extremity edema   . CHF ([place]estive heart failure) ([place]   . Chronic fatigue   . COPD (chronic obstructive pulmonary disease) ([place]   . Diverticulosis of colon (without mention of hemorrhage)   . Esophageal reflux   . Gastritis   . Gluten intolerance   . Hiatal hernia 02/10/11   Noted on CT Scan -  Moderate Hiatal Hernia  . Hyperlipemia   . Hypertension   . Lymphoma of lymph nodes in pelvis ([place]) 03/03/2011    Large Right Retroperitoneal Mass  . Melanoma ([place])    lymphoma  . Microcytic anemia 04/20/2011   . 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 Osteoarthritis    hands/feet,knees, NECK, BACK  . Periaortic lymphadenopathy 02/16/2011  . Personal history of colonic polyps 2004   hyperplastic Dr. Collene Mares  . Renal cyst 02/10/11    Noted on CT Scan - Bilateral Renal Cysts  . S/P radiation therapy 03/15/11 - 04/09/11   Abdominal/ Pelvic Tumor, 3600  cGy/20 Fractions    SOCIAL HX:  Social History   Tobacco Use  . Smoking status: Former Smoker    Packs/day: 1.00    Years: 35.00    Pack years: 35.00    Types: Pipe, Cigarettes    Quit date: 02/23/1992    Years since quitting: 26.6  . Smokeless tobacco: Never Used  . Tobacco comment: quit 20 years ago  Substance Use Topics  . Alcohol use: Yes    Comment: 2 drinks daily scotch  AND WINE WITH SUPPER    ALLERGIES:  Allergies  Allergen Reactions  . Pollen Extract-Tree Extract Other (See Comments)    HEADACHES, TIRED , DRAINAGE FROM SINUSES  . Molds & Smuts Other (See Comments)    Also dust mites causes sinus infections, h/a etc.     PERTINENT MEDICATIONS:  Outpatient Encounter Medications as of 09/28/2018  Medication Sig  . allopurinol (ZYLOPRIM) 100 MG tablet Take 50 mg by mouth daily.  Marland Kitchen diltiazem (CARDIZEM CD) 360 MG 24 hr capsule Take 1 capsule (360 mg total) by mouth daily.  . finasteride (PROSCAR) 5 MG tablet Take 1 tablet (5 mg total) by mouth daily. (Patient taking differently: Take 5 mg by mouth at bedtime. )  . FLUoxetine (PROZAC) 10 MG capsule Take 10 mg by mouth daily.  Marland Kitchen glipiZIDE (GLUCOTROL) 5 MG tablet Take 0.5 tablets (2.5 mg total) by mouth daily before breakfast.  . nystatin (MYCOSTATIN) 100000 UNIT/ML suspension Take 5 mLs by mouth 4 (four) times daily.  Marland Kitchen omeprazole (PRILOSEC) 20 MG capsule Take 20 mg by mouth daily.  . potassium chloride 20 MEQ TBCR Take 10 mEq by mouth daily.  . pramipexole (MIRAPEX) 0.75 MG tablet Take 0.75 mg by mouth at bedtime.  Marland Kitchen spironolactone (ALDACTONE) 25 MG tablet Take 1 tablet (25 mg total) by mouth daily.  Marland Kitchen torsemide (DEMADEX) 20 MG tablet Take 4 tablets (80 mg total) by mouth 2 (two) times daily.  . traMADol (ULTRAM) 50 MG tablet Take 50 mg by mouth every 12 (twelve) hours as needed for moderate pain.   . traZODone (DESYREL) 100 MG tablet Take 1 tablet (100 mg total) by mouth at bedtime as needed for sleep.  . triazolam  (HALCION) 0.25 MG tablet Take 0.25 mg by mouth at bedtime as needed for sleep.    No facility-administered encounter medications on file as of 09/28/2018.      Tanis Burnley Jenetta Downer, NP

## 2018-10-03 ENCOUNTER — Other Ambulatory Visit (HOSPITAL_COMMUNITY): Payer: Self-pay | Admitting: Cardiology

## 2018-10-04 MED ORDER — DILTIAZEM HCL ER COATED BEADS 360 MG PO CP24: 360.0000 mg | ORAL_CAPSULE | Freq: Every day | ORAL | 3 refills | Status: DC

## 2018-10-04 MED ORDER — POTASSIUM CHLORIDE ER 20 MEQ PO TBCR: 10.0000 meq | EXTENDED_RELEASE_TABLET | Freq: Every day | ORAL | 3 refills | Status: DC

## 2018-10-04 NOTE — Telephone Encounter (Signed)
Medication refills addressed in error Patient is still active with CHF clinic and Dr Aundra Dubin is still a chf provider Will return refills as requested

## 2018-10-04 NOTE — Addendum Note (Signed)
Addended by: Sina Sumpter, Sharlot Gowda on: 10/04/2018 10:51 AM   Modules accepted: Orders

## 2018-10-06 ENCOUNTER — Other Ambulatory Visit: Payer: Self-pay

## 2018-10-06 ENCOUNTER — Non-Acute Institutional Stay: Payer: Medicare HMO | Admitting: Adult Health Nurse Practitioner

## 2018-10-06 ENCOUNTER — Encounter (HOSPITAL_COMMUNITY): Payer: Self-pay | Admitting: Cardiology

## 2018-10-06 ENCOUNTER — Ambulatory Visit (HOSPITAL_COMMUNITY)
Admission: RE | Admit: 2018-10-06 | Discharge: 2018-10-06 | Disposition: A | Discharge status: Home / Self Care | Payer: Medicare HMO | Source: Ambulatory Visit | Attending: Cardiology | Admitting: Cardiology

## 2018-10-06 VITALS — BP 112/60 | HR 84 | Wt 208.1 lb

## 2018-10-06 DIAGNOSIS — N183 Chronic kidney disease, stage 3 (moderate): Secondary | ICD-10-CM | POA: Insufficient documentation

## 2018-10-06 DIAGNOSIS — J8410 Pulmonary fibrosis, unspecified: Secondary | ICD-10-CM | POA: Diagnosis not present

## 2018-10-06 DIAGNOSIS — Z87891 Personal history of nicotine dependence: Secondary | ICD-10-CM | POA: Insufficient documentation

## 2018-10-06 DIAGNOSIS — Z9981 Dependence on supplemental oxygen: Secondary | ICD-10-CM | POA: Diagnosis not present

## 2018-10-06 DIAGNOSIS — I130 Hypertensive heart and chronic kidney disease with heart failure and stage 1 through stage 4 chronic kidney disease, or unspecified chronic kidney disease: Secondary | ICD-10-CM | POA: Insufficient documentation

## 2018-10-06 DIAGNOSIS — R0683 Snoring: Secondary | ICD-10-CM | POA: Diagnosis not present

## 2018-10-06 DIAGNOSIS — K219 Gastro-esophageal reflux disease without esophagitis: Secondary | ICD-10-CM | POA: Insufficient documentation

## 2018-10-06 DIAGNOSIS — Z8719 Personal history of other diseases of the digestive system: Secondary | ICD-10-CM | POA: Diagnosis not present

## 2018-10-06 DIAGNOSIS — Z7984 Long term (current) use of oral hypoglycemic drugs: Secondary | ICD-10-CM | POA: Diagnosis not present

## 2018-10-06 DIAGNOSIS — I5032 Chronic diastolic (congestive) heart failure: Secondary | ICD-10-CM | POA: Diagnosis not present

## 2018-10-06 DIAGNOSIS — R600 Localized edema: Secondary | ICD-10-CM | POA: Diagnosis not present

## 2018-10-06 DIAGNOSIS — Z79899 Other long term (current) drug therapy: Secondary | ICD-10-CM | POA: Insufficient documentation

## 2018-10-06 DIAGNOSIS — Z515 Encounter for palliative care: Secondary | ICD-10-CM

## 2018-10-06 DIAGNOSIS — Z8249 Family history of ischemic heart disease and other diseases of the circulatory system: Secondary | ICD-10-CM | POA: Diagnosis not present

## 2018-10-06 DIAGNOSIS — Z8572 Personal history of non-Hodgkin lymphomas: Secondary | ICD-10-CM | POA: Insufficient documentation

## 2018-10-06 DIAGNOSIS — I272 Pulmonary hypertension, unspecified: Secondary | ICD-10-CM | POA: Diagnosis not present

## 2018-10-06 DIAGNOSIS — I498 Other specified cardiac arrhythmias: Secondary | ICD-10-CM | POA: Diagnosis not present

## 2018-10-06 LAB — BASIC METABOLIC PANEL
Anion gap: 12 (ref 5–15)
BUN: 40 mg/dL — ABNORMAL HIGH (ref 8–23)
CO2: 31 mmol/L (ref 22–32)
Calcium: 8.7 mg/dL — ABNORMAL LOW (ref 8.9–10.3)
Chloride: 91 mmol/L — ABNORMAL LOW (ref 98–111)
Creatinine, Ser: 1.64 mg/dL — ABNORMAL HIGH (ref 0.61–1.24)
GFR calc Af Amer: 44 mL/min — ABNORMAL LOW (ref >60)
GFR calc non Af Amer: 38 mL/min — ABNORMAL LOW (ref >60)
Glucose, Bld: 112 mg/dL — ABNORMAL HIGH (ref 70–99)
Potassium: 4.6 mmol/L (ref 3.5–5.1)
Sodium: 134 mmol/L — ABNORMAL LOW (ref 135–145)

## 2018-10-06 MED ORDER — TORSEMIDE 20 MG PO TABS: ORAL_TABLET | ORAL | 6 refills | Status: DC

## 2018-10-06 MED ORDER — METOLAZONE 2.5 MG PO TABS: 2.5000 mg | ORAL_TABLET | Freq: Once | ORAL | 0 refills | Status: DC

## 2018-10-06 NOTE — Progress Notes (Signed)
[facility] Consult Note Telephone: [phone]  Fax: [phone]  PATIENT NAME: Ryan [name] DOB: 09-Nov-1931 MRN: [id number]  PRIMARY CARE PROVIDER:   Chesley Noon, MD  REFERRING PROVIDER:  Chesley Noon, MD [address] [place],  [place] [address]  RESPONSIBLE PARTY:   Haroon Shatto, wife [phone]       RECOMMENDATIONS and PLAN:  1.  Advanced care planning.  Patient is a DNR.  No changes made today.  Called and left VM with wife to update.  Left call back number for any questions or concerns.  2.  CHF.  Patient had appointment with cardiology today and had his torsemide increased for a few days due to increased fluid weight.  He does have increased edema.  Lung sounds clear.  Denies chest pain, cough, or increased SOB.  Continue recommendations by cardiology  3.  Decreasing vision and hearing.  Patient states that he feels isolated because his vision and hearing are worsening.  States that he would feel better with human contact.  Does states that with the pandemic no one is making skin to skin contact and he misses this and it also makes him feel isolated.  States he would like someone to come in and give him a massage.  Stated that I did not if we could do anything for that but would reach out to our team to see if maybe there were any massage therapist who could come to him.    I spent 30 minutes providing this consultation,  from 2:15 to 2:45. More than 50% of the time in this consultation was spent coordinating communication.   HISTORY OF PRESENT ILLNESS:  Ryan [name] is a 85 y.o. year old male with multiple medical problems including CHF, COPD, pulmonary fibrosis, HTN, non hodgkin's lymphoma status post radiation. Palliative Care was asked to help address goals of care.   CODE STATUS: DNR  PPS: 50% HOSPICE ELIGIBILITY/DIAGNOSIS: TBD  PHYSICAL EXAM:   General: NAD, frail appearing Cardiovascular: regular  rate and rhythm Pulmonary: lung sounds somewhat diminished but no abnormal sounds heard; normal respiratory effort Abdomen: soft, nontender, + bowel sounds GU: no suprapubic tenderness Extremities: Patient is wearing TED hose but can still notice pitting edema bilaterally to mid shin with more in the left than the right Skin: no rashes Neurological: Weakness but otherwise nonfocal  PAST MEDICAL HISTORY:  Past Medical History:  Diagnosis Date  . Asthma   . Barrett's esophagus   . Benign prostatic hypertrophy   . Bilateral lower extremity edema   . CHF (congestive heart failure) ([place])   . Chronic fatigue   . COPD (chronic obstructive pulmonary disease) ([place])   . Diverticulosis of colon (without mention of hemorrhage)   . Esophageal reflux   . Gastritis   . Gluten intolerance   . Hiatal hernia 02/10/11   Noted on CT Scan - Moderate Hiatal Hernia  . Hyperlipemia   . Hypertension   . Lymphoma of lymph nodes in pelvis ([place]) 03/03/2011    Large Right Retroperitoneal Mass  . Melanoma ([place])    lymphoma  . Microcytic anemia 04/20/2011   . 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 Osteoarthritis    hands/feet,knees, NECK, BACK  . Periaortic lymphadenopathy 02/16/2011  . Personal history of colonic polyps 2004   hyperplastic Dr. Collene Mares  . Renal cyst 02/10/11    Noted on CT Scan - Bilateral  Renal Cysts  . S/P radiation therapy 03/15/11 - 04/09/11   Abdominal/ Pelvic Tumor, 3600 cGy/20 Fractions    SOCIAL HX:  Social History   Tobacco Use  . Smoking status: Former Smoker    Packs/day: 1.00    Years: 35.00    Pack years: 35.00    Types: Pipe, Cigarettes    Quit date: 02/23/1992    Years since quitting: 26.6  . Smokeless tobacco: Never Used  . Tobacco comment: quit 20 years ago  Substance Use Topics  . Alcohol use: Yes    Comment: 2 drinks daily scotch  AND WINE WITH SUPPER    ALLERGIES:  Allergies  Allergen Reactions  . Pollen  Extract-Tree Extract Other (See Comments)    HEADACHES, TIRED , DRAINAGE FROM SINUSES  . Molds & Smuts Other (See Comments)    Also dust mites causes sinus infections, h/a etc.     PERTINENT MEDICATIONS:  Outpatient Encounter Medications as of 10/06/2018  Medication Sig  . allopurinol (ZYLOPRIM) 100 MG tablet Take 50 mg by mouth daily.  Marland Kitchen diltiazem (CARDIZEM CD) 360 MG 24 hr capsule Take 1 capsule (360 mg total) by mouth daily.  . finasteride (PROSCAR) 5 MG tablet Take 1 tablet (5 mg total) by mouth daily. (Patient taking differently: Take 5 mg by mouth at bedtime. )  . FLUoxetine (PROZAC) 10 MG capsule Take 10 mg by mouth daily.  Marland Kitchen glipiZIDE (GLUCOTROL) 5 MG tablet Take 0.5 tablets (2.5 mg total) by mouth daily before breakfast.  . metolazone (ZAROXOLYN) 2.5 MG tablet Take 1 tablet (2.5 mg total) by mouth once for 1 dose. Then as directed by the CHF clinic  . nystatin (MYCOSTATIN) 100000 UNIT/ML suspension Take 5 mLs by mouth 4 (four) times daily.  Marland Kitchen omeprazole (PRILOSEC) 20 MG capsule Take 20 mg by mouth daily.  . Potassium Chloride ER 20 MEQ TBCR Take 10 mEq by mouth daily.  . pramipexole (MIRAPEX) 0.75 MG tablet Take 0.75 mg by mouth at bedtime.  Marland Kitchen spironolactone (ALDACTONE) 25 MG tablet Take 1 tablet (25 mg total) by mouth daily.  Marland Kitchen torsemide (DEMADEX) 20 MG tablet Take 5 tablets (100 mg total) by mouth every morning AND 4 tablets (80 mg total) every evening.  . traMADol (ULTRAM) 50 MG tablet Take 50 mg by mouth every 12 (twelve) hours as needed for moderate pain.   . traZODone (DESYREL) 100 MG tablet Take 1 tablet (100 mg total) by mouth at bedtime as needed for sleep.  . triazolam (HALCION) 0.25 MG tablet Take 0.25 mg by mouth at bedtime as needed for sleep.    No facility-administered encounter medications on file as of 10/06/2018.      Ayeisha Lindenberger Jenetta Downer, NP

## 2018-10-06 NOTE — Patient Instructions (Signed)
INCREASE Torsemide to 100 mg twice a day for 3 days, then change to 100 mg in the AM and 80 mg in the PM START Metolazone 2.5 mg, take only one dose with the AM dose of torsemide 10/07/18  Labs today We will only contact you if something comes back abnormal or we need to make some changes. Otherwise no news is good news!  Your physician has recommended that you have a sleep study. This test records several body functions during sleep, including: brain activity, eye movement, oxygen and carbon dioxide blood levels, heart rate and rhythm, breathing rate and rhythm, the flow of air through your mouth and nose, snoring, body muscle movements, and chest and belly movement. Please see the attached form for detailed information.  Your physician recommends that you schedule a follow-up appointment in: 10 days with Amy Clegg,NP  Do the following things EVERYDAY: 1) Weigh yourself in the morning before breakfast. Write it down and keep it in a log. 2) Take your medicines as prescribed 3) Eat low salt foods-Limit salt (sodium) to 2000 mg per day.  4) Stay as active as you can everyday 5) Limit all fluids for the day to less than 2 liters  At the [facility], you and your health needs are our priority. As part of our continuing mission to provide you with exceptional heart care, we have created designated Provider Care Teams. These Care Teams include your primary Cardiologist (physician) and Advanced Practice Providers (APPs- Physician Assistants and Nurse Practitioners) who all work together to provide you with the care you need, when you need it.   You may see any of the following providers on your designated Care Team at your next follow up: Marland Kitchen Dr Glori Bickers . Dr Loralie Champagne . Darrick Grinder, NP   Please be sure to bring in all your medications bottles to every appointment.

## 2018-10-06 NOTE — Progress Notes (Signed)
Patient Name:         DOB:       Height:     Weight:  Office Name:         Referring Provider:  Today's Date:  Date:   STOP BANG RISK ASSESSMENT S (snore) Have you been told that you snore?     YES   T (tired) Are you often tired, fatigued, or sleepy during the day?   YES  O (obstruction) Do you stop breathing, choke, or gasp during sleep? NO   P (pressure) Do you have or are you being treated for high blood pressure? YES   B (BMI) Is your body index greater than 35 kg/m? NO   A (age) Are you 83 years old or older? YES   N (neck) Do you have a neck circumference greater than 16 inches?   unknown   G (gender) Are you a male? YES   TOTAL STOP/BANG "YES" ANSWERS                                                                        For Office Use Only              Procedure Order Form    YES to 3+ Stop Bang questions OR two clinical symptoms - patient qualifies for WatchPAT (CPT 95800)     Submit: This Form + Patient Face Sheet + Clinical Note via [facility] or Fax: [phone]         Clinical Notes: Will consult Sleep Specialist and refer for management of therapy due to patient increased risk of Sleep Apnea. Ordering a sleep study due to the following two clinical symptoms: Excessive daytime sleepiness G47.10 / Gastroesophageal reflux K21.9 / Nocturia R35.1 / Morning Headaches G44.221 / Difficulty concentrating R41.840 / Memory problems or poor judgment G31.84 / Personality changes or irritability R45.4 / Loud snoring R06.83 / Depression F32.9 / Unrefreshed by sleep G47.8 / Impotence N52.9 / History of high blood pressure R03.0 / Insomnia G47.00    I understand that I am proceeding with a home sleep apnea test as ordered by my treating physician. I understand that untreated sleep apnea is a serious cardiovascular risk factor and it is my responsibility to perform the test and seek management for sleep apnea. I will be contacted with the results and be managed for sleep apnea  by a local sleep physician. I will be receiving equipment and further instructions from [facility]. I shall promptly ship back the equipment via the included mailing label. I understand my insurance will be billed for the test and as the patient I am responsible for any insurance related out-of-pocket costs incurred. I have been provided with written instructions and can call for additional video or telephonic instruction, with 24-hour availability of qualified personnel to answer any questions: Patient Help Desk [phone].  Patient Signature ______________________________________________________   Date______________________ Patient Telemedicine Verbal Consent

## 2018-10-08 NOTE — Progress Notes (Signed)
PCP: Chesley Noon, MD Cardiology: Dr. Aundra Dubin  85 y.o. with history of interstitial lung disease (?NSIP vs UIP) on home oxygen, pulmonary hypertension, diastolic CHF, and prior non-Hodgkins lymphoma (pelvic mass, s/p XRT) presents for followup of CHF and pulmonary hypertension.  Patient has history of reportedly mild ILD (NSIP vs UIP) followed by a pulmonologist at [facility] and pulmonary hypertension thought to be out of proportion lung disease followed by a physician at [facility].  Last CT chest was in 11/19, showing stable ILD (?UIP vs NSIP).  He had RHC in 2/20 with moderate PAH, PVR 5.1 WU.  He was tried on Adcirca but developed cognitive problems and worsening of peripheral edema so this was stopped.  He has been on home oxygen, uses at night and with exertion.   He was admitted in 6/20 with worsening dyspnea and peripheral edema, weight up despite compliance with torsemide 40 mg bid at home.  He has CKD stage 3 and torsemide was decreased in the spring. He was diuresed aggressively with fall in weight.  Echo showed normal LV size and systolic function, RV reportedly normal.  6/20 RHC showed normal filling pressures with moderate pulmonary hypertension, PVR 5.35 WU.   After discharge from hospital, patient started on ambrisentan.  His weight steadily began to increase and he developed worsening lower extremity edema, we stopped ambrisentan.   Patient was admitted in 8/20 with AKI after taking metolazone for several days.  He was admitted again in 8/20 with acute on chronic diastolic CHF. He was diuresed.  Wandering atrial pacemaker noted.   He is now back at his assisted living facility.  He has been working with PT.  For the last 2-3 days, he has been more short of breath.  Mild dyspnea walking in the hall.  He is using oxygen at all times.  Mildly tachypneic in the office today.  He has orthopnea, no PND.  No chest pain.   ECG (personally reviewed): NSR, iRBBB  Labs (6/20): K 4.7, creatinine  1.46 Labs (7/20): K 3.1, creatinine 1.67 Labs (8/20): K 3.9, creatinine 1.8  PMH: 1. Non-Hodgkin's lymphoma: Pelvic involvement.  S/p radiation.  Thought to be in remission (Dr. Benay Spice).  2. HTN 3. PACs/PVCs 4. GERD: Barrett's esophagus.  5. 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  - High resolution CT chest 11/19: ILD, ?UIP pattern.  6. Pulmonary hypertension: Thought to be out of proportion to underlying lung disease (ILD, ?NSIP vs UIP).   - RHC (2/20): mean RA 10, PASP 60 with mean 42, mean PCWP 11, CI 3, PVR 5.1 WU.  - Trial of Adcirca => failed due to ?memory worsening/cognitive affects and peripheral edema.  - Echo (6/20): EF 60-65%, mild LVH, RV reportedly normal, PASP 59 mmHg.  - RHC (6/20): mean RA 7, PA 50/24 mean 34, mean PCWP 12, CI 2.01, PAPI 3.7, PVR 5.35 WU - RF negative, ANA negative, anti-SCL70 negative, anti-centromere negative.  - V/Q scan (8/20): No evidence for chronic PE.  7. CKD: Stage 3.   8. Wandering atrial pacemaker  Social History   Socioeconomic History  . Marital status: Married    Spouse name: Not on file  . Number of children: 2  . Years of education: Not on file  . Highest education level: Not on file  Occupational History  . 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 Financial resource strain: Not on file  . Food insecurity    Worry: Not on  file    Inability: Not on file  . Transportation needs    Medical: Not on file    Non-medical: Not on file  Tobacco Use  . Smoking status: Former Smoker    Packs/day: 1.00    Years: 35.00    Pack years: 35.00    Types: Pipe, Cigarettes    Quit date: 02/23/1992    Years since quitting: 26.6  . Smokeless tobacco: Never Used  . Tobacco comment: quit 20 years ago  Substance and Sexual Activity  . Alcohol use: Yes    Comment: 2 drinks daily scotch  AND WINE WITH SUPPER  . Drug use: No  . Sexual activity: Not on file  Lifestyle  . Physical activity    Days per week:  Not on file    Minutes per session: Not on file  . Stress: Not on file  Relationships  . Social Herbalist on phone: Not on file    Gets together: Not on file    Attends religious service: Not on file    Active member of club or organization: Not on file    Attends meetings of clubs or organizations: Not on file    Relationship status: Not on file  . Intimate partner violence    Fear of current or ex partner: Not on file    Emotionally abused: Not on file    Physically abused: Not on file    Forced sexual activity: Not on file  Other Topics Concern  . Not on file  Social History Narrative   Married - second marriage   He has two children   Retired Horticulturist, commercial Professor   Currently teaches pottery making, 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[facility]   Former Smoker quit 12 years ago- smoked for 35 years   Alcohol use- yes   Family History  Problem Relation Age of Onset  . Heart disease Father        MI 83  . Urticaria Father   . Prostate cancer Brother   . Prostate cancer Paternal Uncle   . Prostate cancer Paternal Uncle   . Prostate cancer Paternal Uncle   . Hyperlipidemia Other   . Stroke Other   . Hypertension Other   . ADD / ADHD Other   . Colon cancer Neg Hx   . Allergic rhinitis Neg Hx   . Asthma Neg Hx   . Eczema Neg Hx    ROS: All systems reviewed and negative except as per HPI.   Current Outpatient Medications  Medication Sig Dispense Refill  . allopurinol (ZYLOPRIM) 100 MG tablet Take 50 mg by mouth daily.    Marland Kitchen diltiazem (CARDIZEM CD) 360 MG 24 hr capsule Take 1 capsule (360 mg total) by mouth daily. 90 capsule 3  . finasteride (PROSCAR) 5 MG tablet Take 1 tablet (5 mg total) by mouth daily. (Patient taking differently: Take 5 mg by mouth at bedtime. ) 30 tablet 5  . FLUoxetine (PROZAC) 10 MG capsule Take 10 mg by mouth daily.    Marland Kitchen glipiZIDE (GLUCOTROL) 5 MG tablet Take 0.5 tablets (2.5 mg total) by mouth daily before breakfast. 15 tablet 0  . nystatin  (MYCOSTATIN) 100000 UNIT/ML suspension Take 5 mLs by mouth 4 (four) times daily.    Marland Kitchen omeprazole (PRILOSEC) 20 MG capsule Take 20 mg by mouth daily.    . Potassium Chloride ER 20 MEQ TBCR Take 10 mEq by mouth daily. 90 tablet 3  . pramipexole (MIRAPEX)  0.75 MG tablet Take 0.75 mg by mouth at bedtime.    Marland Kitchen spironolactone (ALDACTONE) 25 MG tablet Take 1 tablet (25 mg total) by mouth daily. 30 tablet 0  . torsemide (DEMADEX) 20 MG tablet Take 5 tablets (100 mg total) by mouth every morning AND 4 tablets (80 mg total) every evening. 270 tablet 6  . traMADol (ULTRAM) 50 MG tablet Take 50 mg by mouth every 12 (twelve) hours as needed for moderate pain.     . traZODone (DESYREL) 100 MG tablet Take 1 tablet (100 mg total) by mouth at bedtime as needed for sleep. 30 tablet 0  . triazolam (HALCION) 0.25 MG tablet Take 0.25 mg by mouth at bedtime as needed for sleep.     . metolazone (ZAROXOLYN) 2.5 MG tablet Take 1 tablet (2.5 mg total) by mouth once for 1 dose. Then as directed by the CHF clinic 5 tablet 0   No current facility-administered medications for this encounter.    BP 112/60   Pulse 84   Wt 94.4 kg (208 lb 1.6 oz)   SpO2 98%   BMI 29.02 kg/m  General: NAD Neck: JVP 8-9 cm, no thyromegaly or thyroid nodule.  Lungs: Clear to auscultation bilaterally with normal respiratory effort. CV: Nondisplaced PMI.  Heart regular S1/S2, no S3/S4, no murmur.  2+ edema 1/2 to knees bilaterally.  No carotid bruit.  Normal pedal pulses.  Abdomen: Soft, nontender, no hepatosplenomegaly, no distention.  Skin: Intact without lesions or rashes.  Neurologic: Alert and oriented x 3.  Psych: Normal affect. Extremities: No clubbing or cyanosis.  HEENT: Normal.   Assessment/Plan: 1. Chronic diastolic CHF: Suspect with significant component of RV failure from pulmonary hypertension. Echo in 6/20 with EF 60-65%, RV not well-visualized but PASP 59 mmHg.  RHC in 6/20 showed filling pressures optimized, but he has  been hospitalized recently with volume overload. Ongoing NYHA class III symptoms.  He is volume overloaded on exam.  - Increase torsemide to 100 mg bid x 3 days, then 100 mg qam/80 mg qpm.   - Take metolazone 2.5 mg x 1 tomorrow morning with am torsemide.   - Continue spironolactone 25 mg daily.  - BMET today and again in 10 days.  2. CKD Stage 3: BMET today and again in 10 days.  3. Pulmonary hypertnesion: Patient thought to have [place] out of proportion to his lung disease (ILD, NSIP versus UIP). RHC in 2/20 showed mean PA pressure 41, PVR 5.1 WU, CI 3. Echo in 6/20 with RV reportedly ok =>I reviewed and do not think that RV was well-visualized. Patient did not tolerate Adcirca due to edema/?cognitive effects.  RHC was done again in 6/20, showed moderate pulmonary arterial hypertension with PVR 5.35 (comparable to prior).  Serological workup for PH was negative. Patient started ambrisentan 5 mg and developed significant edema/volume overload despite a high dose of torsemide.  I am concerned that ambrisentan may have been part of the reason for the peripheral edema/volume overload.  - he will stay off ambrisentan (?trigger for volume overload).  - Home sleep study was nondiagnostic, needs to be repeated.  I will order.   - When he is better diuresed, will need to consider Uptravi (intolerant of Adcirca, Letairis).  4. Wandering atrial pacemaker: He is on diltiazem CD 360 mg daily. NSR today.   Followup with NP in 10 days.   Loralie Champagne 10/08/2018

## 2018-10-13 ENCOUNTER — Telehealth: Payer: Self-pay

## 2018-10-13 NOTE — Telephone Encounter (Signed)
Pt is currently in SNF, per email from [facility] 3rd device has been shipped for testing.

## 2018-10-16 ENCOUNTER — Telehealth (HOSPITAL_COMMUNITY): Payer: Self-pay | Admitting: *Deleted

## 2018-10-16 NOTE — Telephone Encounter (Signed)
Pt left VM stating his weight is up 5lbs in 5 days and he notices swelling in feet and ankles. Pt is taking all medications as prescribed and denies any other complaints at this time.  Routed to [place] for advice

## 2018-10-16 NOTE — Telephone Encounter (Signed)
Take metolazone 2.5 mg daily for 2 days in a row.  BMET 1 week.

## 2018-10-17 ENCOUNTER — Encounter (HOSPITAL_COMMUNITY): Payer: Self-pay

## 2018-10-17 ENCOUNTER — Ambulatory Visit (HOSPITAL_COMMUNITY)
Admission: RE | Admit: 2018-10-17 | Discharge: 2018-10-17 | Disposition: A | Discharge status: Home / Self Care | Payer: Medicare HMO | Source: Ambulatory Visit | Attending: Adult Health | Admitting: Adult Health

## 2018-10-17 ENCOUNTER — Other Ambulatory Visit: Payer: Self-pay

## 2018-10-17 VITALS — BP 139/72 | HR 73 | Wt 216.6 lb

## 2018-10-17 DIAGNOSIS — N183 Chronic kidney disease, stage 3 unspecified: Secondary | ICD-10-CM

## 2018-10-17 DIAGNOSIS — Z9981 Dependence on supplemental oxygen: Secondary | ICD-10-CM | POA: Diagnosis not present

## 2018-10-17 DIAGNOSIS — J8410 Pulmonary fibrosis, unspecified: Secondary | ICD-10-CM | POA: Insufficient documentation

## 2018-10-17 DIAGNOSIS — Z8249 Family history of ischemic heart disease and other diseases of the circulatory system: Secondary | ICD-10-CM | POA: Insufficient documentation

## 2018-10-17 DIAGNOSIS — C8590 Non-Hodgkin lymphoma, unspecified, unspecified site: Secondary | ICD-10-CM | POA: Insufficient documentation

## 2018-10-17 DIAGNOSIS — I498 Other specified cardiac arrhythmias: Secondary | ICD-10-CM | POA: Insufficient documentation

## 2018-10-17 DIAGNOSIS — I130 Hypertensive heart and chronic kidney disease with heart failure and stage 1 through stage 4 chronic kidney disease, or unspecified chronic kidney disease: Secondary | ICD-10-CM | POA: Diagnosis not present

## 2018-10-17 DIAGNOSIS — K219 Gastro-esophageal reflux disease without esophagitis: Secondary | ICD-10-CM | POA: Insufficient documentation

## 2018-10-17 DIAGNOSIS — I272 Pulmonary hypertension, unspecified: Secondary | ICD-10-CM

## 2018-10-17 DIAGNOSIS — I5032 Chronic diastolic (congestive) heart failure: Secondary | ICD-10-CM | POA: Diagnosis not present

## 2018-10-17 DIAGNOSIS — Z7984 Long term (current) use of oral hypoglycemic drugs: Secondary | ICD-10-CM | POA: Insufficient documentation

## 2018-10-17 DIAGNOSIS — Z79899 Other long term (current) drug therapy: Secondary | ICD-10-CM | POA: Insufficient documentation

## 2018-10-17 DIAGNOSIS — Z87891 Personal history of nicotine dependence: Secondary | ICD-10-CM | POA: Insufficient documentation

## 2018-10-17 LAB — BASIC METABOLIC PANEL
Anion gap: 13 (ref 5–15)
BUN: 50 mg/dL — ABNORMAL HIGH (ref 8–23)
CO2: 33 mmol/L — ABNORMAL HIGH (ref 22–32)
Calcium: 8.9 mg/dL (ref 8.9–10.3)
Chloride: 87 mmol/L — ABNORMAL LOW (ref 98–111)
Creatinine, Ser: 1.76 mg/dL — ABNORMAL HIGH (ref 0.61–1.24)
GFR calc Af Amer: 40 mL/min — ABNORMAL LOW (ref >60)
GFR calc non Af Amer: 35 mL/min — ABNORMAL LOW (ref >60)
Glucose, Bld: 102 mg/dL — ABNORMAL HIGH (ref 70–99)
Potassium: 4.9 mmol/L (ref 3.5–5.1)
Sodium: 133 mmol/L — ABNORMAL LOW (ref 135–145)

## 2018-10-17 LAB — BRAIN NATRIURETIC PEPTIDE: B Natriuretic Peptide: 77.6 pg/mL (ref 0.0–100.0)

## 2018-10-17 MED ORDER — TORSEMIDE 100 MG PO TABS: 100.0000 mg | ORAL_TABLET | Freq: Two times a day (BID) | ORAL | 6 refills | Status: DC

## 2018-10-17 MED ORDER — METOLAZONE 2.5 MG PO TABS: ORAL_TABLET | ORAL | 3 refills | Status: DC

## 2018-10-17 NOTE — Telephone Encounter (Signed)
Called pt no answer. Left VM for him to call back.

## 2018-10-17 NOTE — Telephone Encounter (Signed)
Called pt no answer/left vm

## 2018-10-17 NOTE — Patient Instructions (Signed)
Lab work done today. We will notify you of any abnormal lab work. No news is good news!  INCREASE Metolazone to 2.42m. 1 tab every Tuesday and Friday.  INCREASE Torsemide to 1024mtwo times daily.  [facility].  At the [facility] and your health needs are our priority. As part of our continuing mission to provide you with exceptional heart care, we have created designated Provider Care Teams. These Care Teams include your primary Cardiologist (physician) and Advanced Practice Providers (APPs- Physician Assistants and Nurse Practitioners) who all work together to provide you with the care you need, when you need it.   You may see any of the following providers on your designated Care Team at your next follow up: . Marland Kitchenr DaGlori Bickers Dr DaLoralie Champagne AmDarrick GrinderNP   Please be sure to bring in all your medications bottles to every appointment.

## 2018-10-17 NOTE — Progress Notes (Signed)
PCP: Chesley Noon, MD--> Now followed by Home Doctor.  Cardiology: Dr. Aundra Dubin  85 y.o. with history of interstitial lung disease (?NSIP vs UIP) on home oxygen, pulmonary hypertension, diastolic CHF, and prior non-Hodgkins lymphoma (pelvic mass, s/p XRT) presents for followup of CHF and pulmonary hypertension.  Patient has history of reportedly mild ILD (NSIP vs UIP) followed by a pulmonologist at [facility] and pulmonary hypertension thought to be out of proportion lung disease followed by a physician at [facility] [place].  Last CT chest was in 11/19, showing stable ILD (?UIP vs NSIP).  He had RHC in 2/20 with moderate PAH, PVR 5.1 WU.  He was tried on Adcirca but developed cognitive problems and worsening of peripheral edema so this was stopped.  He has been on home oxygen, uses at night and with exertion.   He was admitted in 6/20 with worsening dyspnea and peripheral edema, weight up despite compliance with torsemide 40 mg bid at home.  He has CKD stage 3 and torsemide was decreased in the spring. He was diuresed aggressively with fall in weight.  Echo showed normal LV size and systolic function, RV reportedly normal.  6/20 RHC showed normal filling pressures with moderate pulmonary hypertension, PVR 5.35 WU.   After discharge from hospital, patient started on ambrisentan.  His weight steadily began to increase and he developed worsening lower extremity edema, we stopped ambrisentan.   Patient was admitted in 8/20 with AKI after taking metolazone for several days.  He was admitted again in 8/20 with acute on chronic diastolic CHF. He was diuresed.  Wandering atrial pacemaker noted.   Today he returns for HF follow up with his wife.  Last visit torsemide was increased for few days and he was given metolazone for 3 days. He then went back to torsemide 100 mg /80 mg . Weight had initially gone down from 208--> 202 but went back up after stopping higher diuretic dosing.  Overall feeling just ok. Remains SOB  with exertion. Denies PND/Orthopnea. Appetite ok. No fever or chills. Taking all medications. Lives at [facility] independent living.   Labs (6/20): K 4.7, creatinine 1.46 Labs (7/20): K 3.1, creatinine 1.67 Labs (8/20): K 3.9, creatinine 1.8 Labs (10/06/18): K 4.6, creatinine 1.64   PMH: 1. Non-Hodgkin's lymphoma: Pelvic involvement.  S/p radiation.  Thought to be in remission (Dr. Benay Spice).  2. HTN 3. PACs/PVCs 4. GERD: Barrett's esophagus.  5. 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  - High resolution CT chest 11/19: ILD, ?UIP pattern.  6. Pulmonary hypertension: Thought to be out of proportion to underlying lung disease (ILD, ?NSIP vs UIP).   - RHC (2/20): mean RA 10, PASP 60 with mean 42, mean PCWP 11, CI 3, PVR 5.1 WU.  - Trial of Adcirca => failed due to ?memory worsening/cognitive affects and peripheral edema.  - Echo (6/20): EF 60-65%, mild LVH, RV reportedly normal, PASP 59 mmHg.  - RHC (6/20): mean RA 7, PA 50/24 mean 34, mean PCWP 12, CI 2.01, PAPI 3.7, PVR 5.35 WU - RF negative, ANA negative, anti-SCL70 negative, anti-centromere negative.  - V/Q scan (8/20): No evidence for chronic PE.  7. CKD: Stage 3.   8. 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[employer], shop  Social Needs   Financial resource strain: Not on file   Food insecurity    Worry: Not on file    Inability: Not on file   Transportation needs    Medical: Not on file    Non-medical: Not on file  Tobacco Use   Smoking status: Former Smoker    Packs/day: 1.00    Years: 35.00    Pack years: 35.00    Types: Pipe, Cigarettes    Quit date: 02/23/1992    Years since quitting: 26.6   Smokeless tobacco: Never Used   Tobacco comment: quit 20 years ago    Substance and Sexual Activity   Alcohol use: Yes    Comment: 2 drinks daily scotch  AND WINE WITH SUPPER   Drug use: No   Sexual activity: Not on file  Lifestyle   Physical activity    Days per week: Not on file    Minutes per session: Not on file   Stress: Not on file  Relationships   Social connections    Talks on phone: Not on file    Gets together: Not on file    Attends religious service: Not on file    Active member of club or organization: Not on file    Attends meetings of clubs or organizations: Not on file    Relationship status: Not on file   Intimate partner violence    Fear of current or ex partner: Not on file    Emotionally abused: Not on file    Physically abused: Not on file    Forced sexual activity: Not on file  Other Topics Concern   Not on file  Social History Narrative   Married - second marriage   He has two children   Retired Horticulturist, commercial Professor   Currently teaches pottery making, 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[facility]   Former Smoker quit 12 years ago- smoked for 35 years   Alcohol use- yes   Family History  Problem Relation Age of Onset   Heart disease Father        MI 66   Urticaria Father    Prostate cancer Brother    Prostate cancer Paternal Uncle    Prostate cancer Paternal Uncle    Prostate cancer Paternal Uncle    Hyperlipidemia Other    Stroke Other    Hypertension Other    ADD / ADHD Other    Colon cancer Neg Hx    Allergic rhinitis Neg Hx    Asthma Neg Hx    Eczema Neg Hx    ROS: All systems reviewed and negative except as per HPI.   Current Outpatient Medications  Medication Sig Dispense Refill   allopurinol (ZYLOPRIM) 100 MG tablet Take 50 mg by mouth daily.     diltiazem (CARDIZEM CD) 360 MG 24 hr capsule Take 1 capsule (360 mg total) by mouth daily. 90 capsule 3   finasteride (PROSCAR) 5 MG tablet Take 1 tablet (5 mg total) by mouth daily. (Patient taking differently: Take 5 mg by mouth at  bedtime. ) 30 tablet 5   FLUoxetine (PROZAC) 10 MG capsule Take 10 mg by mouth daily.     glipiZIDE (GLUCOTROL) 5 MG tablet Take 0.5 tablets (2.5 mg total) by mouth daily before breakfast. 15 tablet 0   nystatin (MYCOSTATIN) 100000 UNIT/ML suspension Take 5 mLs by mouth 4 (four) times daily.     omeprazole (PRILOSEC) 20 MG capsule Take 20  mg by mouth daily.     Potassium Chloride ER 20 MEQ TBCR Take 10 mEq by mouth daily. 90 tablet 3   pramipexole (MIRAPEX) 0.75 MG tablet Take 0.75 mg by mouth at bedtime.     spironolactone (ALDACTONE) 25 MG tablet Take 1 tablet (25 mg total) by mouth daily. 30 tablet 0   torsemide (DEMADEX) 20 MG tablet Take 5 tablets (100 mg total) by mouth every morning AND 4 tablets (80 mg total) every evening. 270 tablet 6   traMADol (ULTRAM) 50 MG tablet Take 50 mg by mouth every 12 (twelve) hours as needed for moderate pain.      traZODone (DESYREL) 100 MG tablet Take 1 tablet (100 mg total) by mouth at bedtime as needed for sleep. 30 tablet 0   triazolam (HALCION) 0.25 MG tablet Take 0.25 mg by mouth at bedtime as needed for sleep.      metolazone (ZAROXOLYN) 2.5 MG tablet Take 1 tablet (2.5 mg total) by mouth once for 1 dose. Then as directed by the CHF clinic 5 tablet 0   No current facility-administered medications for this encounter.    BP 139/72    Pulse 73    Wt 98.2 kg (216 lb 9.6 oz)    SpO2 94%    BMI 30.21 kg/m   Wt Readings from Last 3 Encounters:  10/17/18 98.2 kg (216 lb 9.6 oz)  10/06/18 94.4 kg (208 lb 1.6 oz)  09/26/18 90.7 kg (199 lb 14.4 oz)    General:   No resp difficulty. Arrived in a wheel chair.  HEENT: normal Neck: supple. JVP 11-12. Carotids 2+ bilat; no bruits. No lymphadenopathy or thryomegaly appreciated. Cor: PMI nondisplaced. Regular rate & rhythm. No rubs, gallops or murmurs. Lungs: Decreased in the bases. On 2 liters oxygen  Abdomen: soft, nontender, distended. No hepatosplenomegaly. No bruits or masses. Good bowel  sounds. Extremities: no cyanosis, clubbing, rash, R and LLE 2+ edema Neuro: alert & orientedx3, cranial nerves grossly intact. moves all 4 extremities w/o difficulty. Affect pleasant   Assessment/Plan: 1. Chronic diastolic CHF: Suspect with significant component of RV failure from pulmonary hypertension. Echo in 6/20 with EF 60-65%, RV not well-visualized but PASP 59 mmHg.  RHC in 6/20 showed filling pressures optimized, but he has been hospitalized recently with volume overload.  NYHA IIIb. - Volume status elevated.  Increase torsemide to 100 mg twice a day and add metolazone 2.5 mg every Tuesday and Friday.    - Continue spironolactone 25 mg daily.  - Check BMET today and 14 days.  2. CKD Stage 3: Check BMET  3. Pulmonary hypertnesion: Patient thought to have [place] out of proportion to his lung disease (ILD, NSIP versus UIP). RHC in 2/20 showed mean PA pressure 41, PVR 5.1 WU, CI 3. Echo in 6/20 with RV reportedly ok =>I reviewed and do not think that RV was well-visualized. Patient did not tolerate Adcirca due to edema/?cognitive effects.  RHC was done again in 6/20, showed moderate pulmonary arterial hypertension with PVR 5.35 (comparable to prior).  Serological workup for PH was negative. Patient started ambrisentan 5 mg and developed significant edema/volume overload despite a high dose of torsemide.  I am concerned that ambrisentan may have been part of the reason for the peripheral edema/volume overload.  - he will stay off ambrisentan (?trigger for volume overload).  - Home sleep study was nondiagnostic, needs to be repeated.  - Not ready for Uptravi with volume overload.  (intolerant of Adcirca, Letairis).  4. Wandering atrial pacemaker: He is on diltiazem CD 360 mg daily.   Follow up 14 days with BMET. Discussed low salt food choices and limiting fluid intake to < 2 liters per day.    Rosser Collington NP-C  10/17/2018

## 2018-10-19 NOTE — Telephone Encounter (Signed)
Pt seen in office on 9/15.

## 2018-10-24 ENCOUNTER — Encounter (INDEPENDENT_AMBULATORY_CARE_PROVIDER_SITE_OTHER): Payer: Medicare HMO | Admitting: Cardiology

## 2018-10-24 DIAGNOSIS — G4733 Obstructive sleep apnea (adult) (pediatric): Secondary | ICD-10-CM

## 2018-10-31 ENCOUNTER — Encounter (HOSPITAL_COMMUNITY): Payer: Medicare HMO | Admitting: Cardiology

## 2018-11-03 ENCOUNTER — Encounter (HOSPITAL_COMMUNITY): Payer: Medicare HMO | Admitting: Cardiology

## 2018-11-06 ENCOUNTER — Other Ambulatory Visit: Payer: Self-pay

## 2018-11-06 ENCOUNTER — Ambulatory Visit (HOSPITAL_COMMUNITY)
Admission: RE | Admit: 2018-11-06 | Discharge: 2018-11-06 | Disposition: A | Discharge status: Home / Self Care | Payer: Medicare HMO | Source: Ambulatory Visit | Attending: Cardiology | Admitting: Cardiology

## 2018-11-06 ENCOUNTER — Encounter (HOSPITAL_COMMUNITY): Payer: Self-pay | Admitting: Cardiology

## 2018-11-06 VITALS — BP 108/58 | HR 82 | Wt 199.6 lb

## 2018-11-06 DIAGNOSIS — Z8572 Personal history of non-Hodgkin lymphomas: Secondary | ICD-10-CM | POA: Insufficient documentation

## 2018-11-06 DIAGNOSIS — I2721 Secondary pulmonary arterial hypertension: Secondary | ICD-10-CM | POA: Diagnosis not present

## 2018-11-06 DIAGNOSIS — Z8249 Family history of ischemic heart disease and other diseases of the circulatory system: Secondary | ICD-10-CM | POA: Diagnosis not present

## 2018-11-06 DIAGNOSIS — I5032 Chronic diastolic (congestive) heart failure: Secondary | ICD-10-CM

## 2018-11-06 DIAGNOSIS — N183 Chronic kidney disease, stage 3 unspecified: Secondary | ICD-10-CM | POA: Insufficient documentation

## 2018-11-06 DIAGNOSIS — Z8042 Family history of malignant neoplasm of prostate: Secondary | ICD-10-CM | POA: Diagnosis not present

## 2018-11-06 DIAGNOSIS — G4733 Obstructive sleep apnea (adult) (pediatric): Secondary | ICD-10-CM | POA: Diagnosis not present

## 2018-11-06 DIAGNOSIS — Z950 Presence of cardiac pacemaker: Secondary | ICD-10-CM | POA: Insufficient documentation

## 2018-11-06 DIAGNOSIS — I272 Pulmonary hypertension, unspecified: Secondary | ICD-10-CM

## 2018-11-06 DIAGNOSIS — Z79899 Other long term (current) drug therapy: Secondary | ICD-10-CM | POA: Insufficient documentation

## 2018-11-06 DIAGNOSIS — I130 Hypertensive heart and chronic kidney disease with heart failure and stage 1 through stage 4 chronic kidney disease, or unspecified chronic kidney disease: Secondary | ICD-10-CM | POA: Diagnosis present

## 2018-11-06 DIAGNOSIS — Z7984 Long term (current) use of oral hypoglycemic drugs: Secondary | ICD-10-CM | POA: Insufficient documentation

## 2018-11-06 DIAGNOSIS — I4510 Unspecified right bundle-branch block: Secondary | ICD-10-CM | POA: Diagnosis not present

## 2018-11-06 DIAGNOSIS — Z823 Family history of stroke: Secondary | ICD-10-CM | POA: Diagnosis not present

## 2018-11-06 DIAGNOSIS — K219 Gastro-esophageal reflux disease without esophagitis: Secondary | ICD-10-CM | POA: Insufficient documentation

## 2018-11-06 DIAGNOSIS — Z87891 Personal history of nicotine dependence: Secondary | ICD-10-CM | POA: Insufficient documentation

## 2018-11-06 DIAGNOSIS — I491 Atrial premature depolarization: Secondary | ICD-10-CM | POA: Diagnosis not present

## 2018-11-06 LAB — BASIC METABOLIC PANEL
Anion gap: 16 — ABNORMAL HIGH (ref 5–15)
BUN: 103 mg/dL — ABNORMAL HIGH (ref 8–23)
CO2: 35 mmol/L — ABNORMAL HIGH (ref 22–32)
Calcium: 8.9 mg/dL (ref 8.9–10.3)
Chloride: 78 mmol/L — ABNORMAL LOW (ref 98–111)
Creatinine, Ser: 2.58 mg/dL — ABNORMAL HIGH (ref 0.61–1.24)
GFR calc Af Amer: 25 mL/min — ABNORMAL LOW (ref >60)
GFR calc non Af Amer: 22 mL/min — ABNORMAL LOW (ref >60)
Glucose, Bld: 162 mg/dL — ABNORMAL HIGH (ref 70–99)
Potassium: 3.9 mmol/L (ref 3.5–5.1)
Sodium: 129 mmol/L — ABNORMAL LOW (ref 135–145)

## 2018-11-06 MED ORDER — SELEXIPAG 200 MCG PO TABS: 200.0000 ug | ORAL_TABLET | Freq: Two times a day (BID) | ORAL | 2 refills | Status: DC

## 2018-11-06 NOTE — Patient Instructions (Addendum)
START Uptravi as directed.  This medication will be titrated over time.  The Nurse from [facility] will call you to discuss.   Labs today We will only contact you if something comes back abnormal or we need to make some changes. Otherwise no news is good news!  You have been referred to Dr Radford Pax for concerns regarding your CPAP.  They will reach out to you to help manage this.  Your physician recommends that you schedule a follow-up appointment in: 6 weeks with Dr Aundra Dubin.   At the [facility], you and your health needs are our priority. As part of our continuing mission to provide you with exceptional heart care, we have created designated Provider Care Teams. These Care Teams include your primary Cardiologist (physician) and Advanced Practice Providers (APPs- Physician Assistants and Nurse Practitioners) who all work together to provide you with the care you need, when you need it.   You may see any of the following providers on your designated Care Team at your next follow up: Marland Kitchen Dr Glori Bickers . Dr Loralie Champagne . Darrick Grinder, NP   Please be sure to bring in all your medications bottles to every appointment.

## 2018-11-07 ENCOUNTER — Telehealth (HOSPITAL_COMMUNITY): Payer: Self-pay | Admitting: Pharmacist

## 2018-11-07 NOTE — Telephone Encounter (Signed)
Patient Advocate Encounter   Received notification from [facility] that prior authorization for Ryan [name] is required. Medication is not preferred on insurance plan.    PA submitted on CoverMyMeds Key [id number] Status is pending   Will continue to follow.  Audry Riles, PharmD, BCPS, CPP Heart Failure Clinic Pharmacist [phone]

## 2018-11-07 NOTE — Progress Notes (Signed)
PCP: Chesley Noon, MD Cardiology: Dr. Aundra Dubin  85 y.o. with history of interstitial lung disease (?NSIP vs UIP) on home oxygen, pulmonary hypertension, diastolic CHF, and prior non-Hodgkins lymphoma (pelvic mass, s/p XRT) presents for followup of CHF and pulmonary hypertension.  Patient has history of reportedly mild ILD (NSIP vs UIP) followed by a pulmonologist at [facility] and pulmonary hypertension thought to be out of proportion lung disease followed by a physician at [facility].  Last CT chest was in 11/19, showing stable ILD (?UIP vs NSIP).  He had RHC in 2/20 with moderate PAH, PVR 5.1 WU.  He was tried on Adcirca but developed cognitive problems and worsening of peripheral edema so this was stopped.  He has been on home oxygen, uses at night and with exertion.   He was admitted in 6/20 with worsening dyspnea and peripheral edema, weight up despite compliance with torsemide 40 mg bid at home.  He has CKD stage 3 and torsemide was decreased in the spring. He was diuresed aggressively with fall in weight.  Echo showed normal LV size and systolic function, RV reportedly normal.  6/20 RHC showed normal filling pressures with moderate pulmonary hypertension, PVR 5.35 WU.   After discharge from hospital, patient started on ambrisentan.  His weight steadily began to increase and he developed worsening lower extremity edema, we stopped ambrisentan.   Patient was admitted in 8/20 with AKI after taking metolazone for several days.  He was admitted again in 8/20 with acute on chronic diastolic CHF. He was diuresed.  Wandering atrial pacemaker noted.   He is now back at his assisted living facility.  At last 2 appointments, diuretics increased due to volume overload.  He has lost 17 lbs since last appointment. He continues to use home oxygen. No dyspnea walking around his apartment. He is getting PT.  He has slept chronically in a recliner chair.  He goes for daily walks, generally walks about 100 yards.  He  is short of breath by the end of the walk.  He has not tolerated CPAP.  He never had a sleep study, was put on CPAP at last hospitalization.   ECG (personally reviewed): Wandering atrial pacemaker with short AT run.   Labs (6/20): K 4.7, creatinine 1.46 Labs (7/20): K 3.1, creatinine 1.67 Labs (8/20): K 3.9, creatinine 1.8 Labs (9/20): K 4.9, creatinine 1.76, BNP 78  PMH: 1. Non-Hodgkin's lymphoma: Pelvic involvement.  S/p radiation.  Thought to be in remission (Dr. Benay Spice).  2. HTN 3. PACs/PVCs 4. GERD: Barrett's esophagus.  5. 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  - High resolution CT chest 11/19: ILD, ?UIP pattern.  6. Pulmonary hypertension: Thought to be out of proportion to underlying lung disease (ILD, ?NSIP vs UIP).   - RHC (2/20): mean RA 10, PASP 60 with mean 42, mean PCWP 11, CI 3, PVR 5.1 WU.  - Trial of Adcirca => failed due to ?memory worsening/cognitive affects and peripheral edema.  - Echo (6/20): EF 60-65%, mild LVH, RV reportedly normal, PASP 59 mmHg.  - RHC (6/20): mean RA 7, PA 50/24 mean 34, mean PCWP 12, CI 2.01, PAPI 3.7, PVR 5.35 WU - RF negative, ANA negative, anti-SCL70 negative, anti-centromere negative.  - V/Q scan (8/20): No evidence for chronic PE.  7. CKD: Stage 3.   8. Wandering atrial pacemaker 9. OSA  Social History   Socioeconomic History  . Marital status: Married    Spouse name: Not on file  . Number of children: 2  .  Years of education: Not on file  . Highest education level: Not on file  Occupational History  . 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 Financial resource strain: Not on file  . Food insecurity    Worry: Not on file    Inability: Not on file  . Transportation needs    Medical: Not on file    Non-medical: Not on file  Tobacco Use  . Smoking status: Former Smoker    Packs/day: 1.00    Years: 35.00    Pack years: 35.00    Types: Pipe, Cigarettes    Quit date: 02/23/1992    Years  since quitting: 26.7  . Smokeless tobacco: Never Used  . Tobacco comment: quit 20 years ago  Substance and Sexual Activity  . Alcohol use: Yes    Comment: 2 drinks daily scotch  AND WINE WITH SUPPER  . Drug use: No  . Sexual activity: Not on file  Lifestyle  . Physical activity    Days per week: Not on file    Minutes per session: Not on file  . Stress: Not on file  Relationships  . Social Herbalist on phone: Not on file    Gets together: Not on file    Attends religious service: Not on file    Active member of club or organization: Not on file    Attends meetings of clubs or organizations: Not on file    Relationship status: Not on file  . Intimate partner violence    Fear of current or ex partner: Not on file    Emotionally abused: Not on file    Physically abused: Not on file    Forced sexual activity: Not on file  Other Topics Concern  . 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 Heart disease Father        MI 30  . Urticaria Father   . Prostate cancer Brother   . Prostate cancer Paternal Uncle   . Prostate cancer Paternal Uncle   . Prostate cancer Paternal Uncle   . Hyperlipidemia Other   . Stroke Other   . Hypertension Other   . ADD / ADHD Other   . Colon cancer Neg Hx   . Allergic rhinitis Neg Hx   . Asthma Neg Hx   . Eczema Neg Hx    ROS: All systems reviewed and negative except as per HPI.   Current Outpatient Medications  Medication Sig Dispense Refill  . allopurinol (ZYLOPRIM) 100 MG tablet Take 50 mg by mouth daily.    . diazepam (VALIUM) 1 MG/ML solution Take 5 mg by mouth every 8 (eight) hours as needed for anxiety.     Marland Kitchen diltiazem (CARDIZEM CD) 360 MG 24 hr  capsule Take 1 capsule (360 mg total) by mouth daily. 90 capsule 3  . escitalopram (LEXAPRO) 10 MG tablet Take 10 mg by mouth daily.    . finasteride (PROSCAR) 5 MG tablet Take 1 tablet (5 mg total) by mouth daily. (Patient taking differently: Take 5 mg by mouth at bedtime. ) 30 tablet 5  .  glipiZIDE (GLUCOTROL) 5 MG tablet Take 0.5 tablets (2.5 mg total) by mouth daily before breakfast. 15 tablet 0  . metolazone (ZAROXOLYN) 2.5 MG tablet Every Tuesday and Friday 30 tablet 3  . nystatin (MYCOSTATIN) 100000 UNIT/ML suspension Take 5 mLs by mouth 4 (four) times daily.    Marland Kitchen omeprazole (PRILOSEC) 20 MG capsule Take 20 mg by mouth daily.    . Potassium Chloride ER 20 MEQ TBCR Take 10 mEq by mouth daily. 90 tablet 3  . pramipexole (MIRAPEX) 0.75 MG tablet Take 0.75 mg by mouth at bedtime.    Marland Kitchen spironolactone (ALDACTONE) 25 MG tablet Take 1 tablet (25 mg total) by mouth daily. 30 tablet 0  . torsemide (DEMADEX) 100 MG tablet Take 1 tablet (100 mg total) by mouth 2 (two) times daily. 270 tablet 6  . traMADol (ULTRAM) 50 MG tablet Take 50 mg by mouth every 12 (twelve) hours as needed for moderate pain.     . Selexipag 200 MCG TABS Take 1 tablet (200 mcg total) by mouth 2 (two) times daily. 60 tablet 2   No current facility-administered medications for this encounter.    BP (!) 108/58   Pulse 82   Wt 90.5 kg (199 lb 9.6 oz)   SpO2 98%   BMI 27.84 kg/m  General: NAD Neck: JVP 7-8 cm, no thyromegaly or thyroid nodule.  Lungs: Dry crackles at bases bilaterally.  CV: Nondisplaced PMI.  Heart irregular S1/S2, no S3/S4, no murmur.  Trace ankle edema.  No carotid bruit.  Normal pedal pulses.  Abdomen: Soft, nontender, no hepatosplenomegaly, no distention.  Skin: Intact without lesions or rashes.  Neurologic: Alert and oriented x 3.  Psych: Normal affect. Extremities: No clubbing or cyanosis.  HEENT: Normal.   Assessment/Plan: 1. Chronic diastolic CHF: Suspect with significant component of RV failure  from pulmonary hypertension. Echo in 6/20 with EF 60-65%, RV not well-visualized but PASP 59 mmHg.  RHC in 6/20 showed filling pressures optimized, but he has been hospitalized recently with volume overload. NYHA class II-III symptoms, somewhat better with weight loss. Volume status improved on exam and weight down considerably.  - Continue torsemide 100 mg bid and metolazone 2.5 twice a week.  Will need BMET today, may have to back off on diuretics if BUN/creatinine up.     - Continue spironolactone 25 mg daily.  2. CKD Stage 3:  BMET today as above.  3. Pulmonary hypertnesion: Patient thought to have [place] out of proportion to his lung disease (ILD, NSIP versus UIP). RHC in 2/20 showed mean PA pressure 41, PVR 5.1 WU, CI 3. Echo in 6/20 with RV reportedly ok =>I reviewed and do not think that RV was well-visualized. Patient did not tolerate Adcirca due to edema/?cognitive effects.  RHC was done again in 6/20, showed moderate pulmonary arterial hypertension with PVR 5.35 (comparable to prior).  Serological workup for PH was negative. Patient started ambrisentan 5 mg and developed significant edema/volume overload despite a high dose of torsemide.  I am concerned that ambrisentan may have been part of the reason for the peripheral edema/volume overload.  - he will stay off ambrisentan (?trigger for volume overload).  - As above, he did not tolerate tadalafil.  - Unable to tolerate CPAP, see below.  - Now that he is better diuresed, I will try him on Uptravi.  4. Wandering atrial pacemaker: He is on diltiazem CD 360 mg daily.  5. OSA: Has CPAP.  Having trouble using it. He had a home  sleep study to take but was unable to successfully complete.  I think he is going to need a formal sleep medicine consult, will refer to Dr. Radford Pax.   Followup in 6 wks.   Loralie Champagne 11/07/2018

## 2018-11-08 ENCOUNTER — Telehealth (HOSPITAL_COMMUNITY): Payer: Self-pay | Admitting: Cardiology

## 2018-11-08 NOTE — Telephone Encounter (Signed)
Patients wife left message on triage line with a request to review patients most recent lab results.  LMOM

## 2018-11-09 ENCOUNTER — Telehealth (HOSPITAL_COMMUNITY): Payer: Self-pay | Admitting: Pharmacist

## 2018-11-09 ENCOUNTER — Encounter (HOSPITAL_COMMUNITY): Payer: Medicare HMO

## 2018-11-09 NOTE — Telephone Encounter (Signed)
Advanced Heart Failure Patient Advocate Encounter  Prior Authorization for Mal[facility] Johns has been approved.    Effective dates: 02/01/2018 through 02/01/2019  Patients co-pay is $623.22  PAF grant is pending income documentation. Ms. Shostak will bring in income documentation on 11/15/2018.  Audry Riles, PharmD, BCPS, CPP Heart Failure Clinic Pharmacist [phone]

## 2018-11-10 ENCOUNTER — Telehealth (HOSPITAL_COMMUNITY): Payer: Self-pay | Admitting: Pharmacist

## 2018-11-10 NOTE — Telephone Encounter (Signed)
Advanced Heart Failure Patient Advocate Encounter  Prior Authorization for Ryan [name] has been approved.    Effective dates: 02/01/2018 through 02/01/2019  Patients co-pay is $623.22  Patient will fill through [facility]. Patient enrollment form faxed to [facility] and [employer] on 11/10/18.  [employer] Fax: [phone] [facility] Fax: [phone]   Obtained grant through [facility]  Effective dates: 02/01/2018 through 02/01/2019  ID: [id number] Group: [id number] Bin: [id number]  Provided grant information to [facility]. Copay: $0.00.   Audry Riles, PharmD, BCPS, CPP Heart Failure Clinic Pharmacist [phone]

## 2018-11-10 NOTE — Telephone Encounter (Signed)
Results reviewed with patients wife Questions addressed

## 2018-11-15 ENCOUNTER — Ambulatory Visit (HOSPITAL_COMMUNITY)
Admission: RE | Admit: 2018-11-15 | Discharge: 2018-11-15 | Disposition: A | Discharge status: Home / Self Care | Payer: Medicare HMO | Source: Ambulatory Visit | Attending: Adult Health | Admitting: Adult Health

## 2018-11-15 ENCOUNTER — Other Ambulatory Visit: Payer: Self-pay

## 2018-11-15 ENCOUNTER — Encounter (HOSPITAL_COMMUNITY): Payer: Self-pay

## 2018-11-15 VITALS — BP 120/72 | HR 75 | Wt 202.2 lb

## 2018-11-15 DIAGNOSIS — Z950 Presence of cardiac pacemaker: Secondary | ICD-10-CM | POA: Diagnosis not present

## 2018-11-15 DIAGNOSIS — I5032 Chronic diastolic (congestive) heart failure: Secondary | ICD-10-CM | POA: Diagnosis not present

## 2018-11-15 DIAGNOSIS — Z87891 Personal history of nicotine dependence: Secondary | ICD-10-CM | POA: Diagnosis not present

## 2018-11-15 DIAGNOSIS — Z79899 Other long term (current) drug therapy: Secondary | ICD-10-CM | POA: Diagnosis not present

## 2018-11-15 DIAGNOSIS — I2721 Secondary pulmonary arterial hypertension: Secondary | ICD-10-CM | POA: Diagnosis not present

## 2018-11-15 DIAGNOSIS — I130 Hypertensive heart and chronic kidney disease with heart failure and stage 1 through stage 4 chronic kidney disease, or unspecified chronic kidney disease: Secondary | ICD-10-CM | POA: Diagnosis not present

## 2018-11-15 DIAGNOSIS — N183 Chronic kidney disease, stage 3 unspecified: Secondary | ICD-10-CM | POA: Diagnosis not present

## 2018-11-15 DIAGNOSIS — J849 Interstitial pulmonary disease, unspecified: Secondary | ICD-10-CM | POA: Diagnosis not present

## 2018-11-15 DIAGNOSIS — Z7984 Long term (current) use of oral hypoglycemic drugs: Secondary | ICD-10-CM | POA: Insufficient documentation

## 2018-11-15 DIAGNOSIS — Z8249 Family history of ischemic heart disease and other diseases of the circulatory system: Secondary | ICD-10-CM | POA: Insufficient documentation

## 2018-11-15 DIAGNOSIS — K219 Gastro-esophageal reflux disease without esophagitis: Secondary | ICD-10-CM | POA: Insufficient documentation

## 2018-11-15 DIAGNOSIS — Z8572 Personal history of non-Hodgkin lymphomas: Secondary | ICD-10-CM | POA: Diagnosis not present

## 2018-11-15 DIAGNOSIS — Z9981 Dependence on supplemental oxygen: Secondary | ICD-10-CM | POA: Diagnosis not present

## 2018-11-15 LAB — BASIC METABOLIC PANEL
Anion gap: 12 (ref 5–15)
BUN: 33 mg/dL — ABNORMAL HIGH (ref 8–23)
CO2: 31 mmol/L (ref 22–32)
Calcium: 8.6 mg/dL — ABNORMAL LOW (ref 8.9–10.3)
Chloride: 94 mmol/L — ABNORMAL LOW (ref 98–111)
Creatinine, Ser: 1.5 mg/dL — ABNORMAL HIGH (ref 0.61–1.24)
GFR calc Af Amer: 49 mL/min — ABNORMAL LOW (ref >60)
GFR calc non Af Amer: 42 mL/min — ABNORMAL LOW (ref >60)
Glucose, Bld: 66 mg/dL — ABNORMAL LOW (ref 70–99)
Potassium: 4.2 mmol/L (ref 3.5–5.1)
Sodium: 137 mmol/L (ref 135–145)

## 2018-11-15 NOTE — Progress Notes (Signed)
Advanced HF Clinic Note  PCP: Chesley Noon, MD Cardiology: Dr. Aundra Dubin  85 y.o. with history of interstitial lung disease (?NSIP vs UIP) on home oxygen, pulmonary hypertension, diastolic CHF, and prior non-Hodgkins lymphoma (pelvic mass, s/p XRT) presents for followup of CHF and pulmonary hypertension.  Patient has history of reportedly mild ILD (NSIP vs UIP) followed by a pulmonologist at [facility] and pulmonary hypertension thought to be out of proportion lung disease followed by a physician at [facility].  Last CT chest was in 11/19, showing stable ILD (?UIP vs NSIP).  He had RHC in 2/20 with moderate PAH, PVR 5.1 WU.  He was tried on Adcirca but developed cognitive problems and worsening of peripheral edema so this was stopped.  He has been on home oxygen, uses at night and with exertion.   He was admitted in 6/20 with worsening dyspnea and peripheral edema, weight up despite compliance with torsemide 40 mg bid at home.  He has CKD stage 3 and torsemide was decreased in the spring. He was diuresed aggressively with fall in weight.  Echo showed normal LV size and systolic function, RV reportedly normal.  6/20 RHC showed normal filling pressures with moderate pulmonary hypertension, PVR 5.35 WU.   After discharge from hospital, patient started on ambrisentan.  His weight steadily began to increase and he developed worsening lower extremity edema, we stopped ambrisentan.   Patient was admitted in 8/20 with AKI after taking metolazone for several days.  He was admitted again in 8/20 with acute on chronic diastolic CHF. He was diuresed.  Wandering atrial pacemaker noted.   He saw Dr. Aundra Dubin 9/4 and was with ongoing NYHA class III symptoms.  He was volume overloaded on exam and instructed to Increase torsemide to 100 mg bid x 3 days, then 100 mg qam/80 mg qpm.  Also advised to take a 1x dose metolazone 2.5 mg and to continue spironolactone 25 mg daily. On f/u visit on 9/15 he had persistent volume  overload (had initially improved w/ increase diuretics but wt and edema progressed when doses were tapered). He was instructed to increase torsemide to 100 mg bid and to increase torsemide to 2.5 mg every Tue and Fri. He was seen back again on 10/5 and was much improved. Wt was down 17 lb but repeat BMP showed rise in SCr from 1.76>>2.58. BUN increased from 50>>103. He was advised to hold torsemide x 2 days then resume and to stop metolazone indefinitely.  He presents back to clinic today for f/u and for repeat BMP. He is here w/ his wife. His weight at last OV on 10/5 was 199 lb. Today, he is up 3 lb at 202 lb. O2 sats 98% on RA. BP 120/72. Feels that he is doing better. Less SOB. Weights at home have been stable. He has been working w/ PT at [employer] [employer] and has noticed improved exercise tolerance and less exertional dyspnea.      Labs (6/20): K 4.7, creatinine 1.46 Labs (7/20): K 3.1, creatinine 1.67 Labs (8/20): K 3.9, creatinine 1.8  PMH: 1. Non-Hodgkin's lymphoma: Pelvic involvement.  S/p radiation.  Thought to be in remission (Dr. Benay Spice).  2. HTN 3. PACs/PVCs 4. GERD: Barrett's esophagus.  5. 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  - High resolution CT chest 11/19: ILD, ?UIP pattern.  6. Pulmonary hypertension: Thought to be out of proportion to underlying lung disease (ILD, ?NSIP vs UIP).   - RHC (2/20): mean RA 10, PASP 60 with mean  42, mean PCWP 11, CI 3, PVR 5.1 WU.  - Trial of Adcirca => failed due to ?memory worsening/cognitive affects and peripheral edema.  - Echo (6/20): EF 60-65%, mild LVH, RV reportedly normal, PASP 59 mmHg.  - RHC (6/20): mean RA 7, PA 50/24 mean 34, mean PCWP 12, CI 2.01, PAPI 3.7, PVR 5.35 WU - RF negative, ANA negative, anti-SCL70 negative, anti-centromere negative.  - V/Q scan (8/20): No evidence for chronic PE.  7. CKD: Stage 3.   8. Wandering atrial pacemaker  Social History   Socioeconomic History  . Marital status: Married    Spouse name: Not on  file  . Number of children: 2  . Years of education: Not on file  . Highest education level: Not on file  Occupational History  . 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 Financial resource strain: Not on file  . Food insecurity    Worry: Not on file    Inability: Not on file  . Transportation needs    Medical: Not on file    Non-medical: Not on file  Tobacco Use  . Smoking status: Former Smoker    Packs/day: 1.00    Years: 35.00    Pack years: 35.00    Types: Pipe, Cigarettes    Quit date: 02/23/1992    Years since quitting: 26.7  . Smokeless tobacco: Never Used  . Tobacco comment: quit 20 years ago  Substance and Sexual Activity  . Alcohol use: Yes    Comment: 2 drinks daily scotch  AND WINE WITH SUPPER  . Drug use: No  . Sexual activity: Not on file  Lifestyle  . Physical activity    Days per week: Not on file    Minutes per session: Not on file  . Stress: Not on file  Relationships  . Social Herbalist on phone: Not on file    Gets together: Not on file    Attends religious service: Not on file    Active member of club or organization: Not on file    Attends meetings of clubs or organizations: Not on file    Relationship status: Not on file  . Intimate partner violence    Fear of current or ex partner: Not on file    Emotionally abused: Not on file    Physically abused: Not on file    Forced sexual activity: Not on file  Other Topics Concern  . Not on file  Social History Narrative   Married - second marriage   He has two children   Retired Horticulturist, commercial Professor   Currently teaches pottery making, 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[facility]   Former Smoker quit 12 years ago- smoked for 35 years   Alcohol use- yes   Family History  Problem Relation Age of Onset  . Heart disease Father        MI 40  . Urticaria Father   . Prostate cancer Brother   . Prostate cancer Paternal Uncle   . Prostate cancer Paternal  Uncle   . Prostate cancer Paternal Uncle   . Hyperlipidemia Other   . Stroke Other   . Hypertension Other   . ADD / ADHD Other   . Colon cancer Neg Hx   . Allergic rhinitis Neg Hx   . Asthma Neg Hx   . Eczema Neg Hx    ROS: All systems reviewed and negative except as per HPI.  Current Outpatient Medications  Medication Sig Dispense Refill  . allopurinol (ZYLOPRIM) 100 MG tablet Take 50 mg by mouth daily.    . diazepam (VALIUM) 1 MG/ML solution Take 5 mg by mouth every 8 (eight) hours as needed for anxiety.     Marland Kitchen diltiazem (CARDIZEM CD) 360 MG 24 hr capsule Take 1 capsule (360 mg total) by mouth daily. 90 capsule 3  . escitalopram (LEXAPRO) 10 MG tablet Take 10 mg by mouth daily.    . finasteride (PROSCAR) 5 MG tablet Take 1 tablet (5 mg total) by mouth daily. (Patient taking differently: Take 5 mg by mouth at bedtime. ) 30 tablet 5  . glipiZIDE (GLUCOTROL) 5 MG tablet Take 0.5 tablets (2.5 mg total) by mouth daily before breakfast. 15 tablet 0  . metolazone (ZAROXOLYN) 2.5 MG tablet Every Tuesday and Friday 30 tablet 3  . nystatin (MYCOSTATIN) 100000 UNIT/ML suspension Take 5 mLs by mouth 4 (four) times daily.    Marland Kitchen omeprazole (PRILOSEC) 20 MG capsule Take 20 mg by mouth daily.    . Potassium Chloride ER 20 MEQ TBCR Take 10 mEq by mouth daily. 90 tablet 3  . pramipexole (MIRAPEX) 0.75 MG tablet Take 0.75 mg by mouth at bedtime.    Marland Kitchen spironolactone (ALDACTONE) 25 MG tablet Take 1 tablet (25 mg total) by mouth daily. 30 tablet 0  . torsemide (DEMADEX) 100 MG tablet Take 1 tablet (100 mg total) by mouth 2 (two) times daily. 270 tablet 6  . traMADol (ULTRAM) 50 MG tablet Take 50 mg by mouth every 12 (twelve) hours as needed for moderate pain.     . Selexipag 200 MCG TABS Take 1 tablet (200 mcg total) by mouth 2 (two) times daily. (Patient not taking: Reported on 11/15/2018) 60 tablet 2   No current facility-administered medications for this encounter.    BP 120/72   Pulse 75   Wt 91.7  kg (202 lb 3.2 oz)   SpO2 93%   BMI 28.20 kg/m  PHYSICAL EXAM: General:  Well appearing. No respiratory difficulty HEENT: normal Neck: supple. no JVD. Carotids 2+ bilat; no bruits. No lymphadenopathy or thyromegaly appreciated. Cor: PMI nondisplaced. Regular rate & rhythm. No rubs, gallops or murmurs. Lungs: clear Abdomen: soft, nontender, nondistended. No hepatosplenomegaly. No bruits or masses. Good bowel sounds. Extremities: no cyanosis, clubbing, rash, trace bilateal edema Neuro: alert & oriented x 3, cranial nerves grossly intact. moves all 4 extremities w/o difficulty. Affect pleasant.   Assessment/Plan: 1. Chronic diastolic CHF: Suspect with significant component of RV failure from pulmonary hypertension. Echo in 6/20 with EF 60-65%, RV not well-visualized but PASP 59 mmHg.  RHC in 6/20 showed filling pressures optimized, but he has been hospitalized recently with volume overload. Has required recent diuretic dose adjustments w/ 17 lb wt loss. Metolazone discontinued last clinic visit due to AKI.  -Remains on torsemide 100 mg bid. Wt fairly stable. Up 3 lb from last OV but given worsening renal function w/ metolazone, will continue to hold off on restarting. He will continue to monitor weight closely at home and will notify clinic if any significant wt gain.     - Continue spironolactone 25 mg daily.  - Recheck BMP today.  -Continue w/ compression stockings -We discussed low salt diet and fluid restriction < 2 L/day   2. CKD Stage 3: recheck BMP today   3. Pulmonary hypertnesion: Patient thought to have [place] out of proportion to his lung disease (ILD, NSIP versus UIP). [place] in 2/20  showed mean PA pressure 41, PVR 5.1 WU, CI 3. Echo in 6/20 with RV reportedly ok =>I reviewed and do not think that RV was well-visualized. Patient did not tolerate Adcirca due to edema/?cognitive effects.  RHC was done again in 6/20, showed moderate pulmonary arterial hypertension with PVR 5.35  (comparable to prior).  Serological workup for PH was negative. Patient started ambrisentan 5 mg and developed significant edema/volume overload despite a high dose of torsemide.  Concerned that ambrisentan may have been part of the reason for the peripheral edema/volume overload.  - he will stay off ambrisentan (?trigger for volume overload).  - Home sleep study was nondiagnostic, needs to be repeated. He has been referred to Dr. Radford Pax.   - When he is better diuresed, will need to consider Uptravi (intolerant of Adcirca, [place]). Pharmacy working on authorization.   4. Wandering atrial pacemaker: He is on diltiazem CD 360 mg daily. NSR today.   Keep f/u with Dr. Aundra Dubin 11/17.   Lyda Jester 11/15/2018

## 2018-11-15 NOTE — Patient Instructions (Signed)
It was great to see you today! No medication changes are needed at this time.   Labs today We will only contact you if something comes back abnormal or we need to make some changes. Otherwise no news is good news!  Keep your follow up as scheduled with Dr Aundra Dubin  Do the following things EVERYDAY: 1) Weigh yourself in the morning before breakfast. Write it down and keep it in a log. 2) Take your medicines as prescribed 3) Eat low salt foods-Limit salt (sodium) to 2000 mg per day.  4) Stay as active as you can everyday 5) Limit all fluids for the day to less than 2 liters  At the [facility], you and your health needs are our priority. As part of our continuing mission to provide you with exceptional heart care, we have created designated Provider Care Teams. These Care Teams include your primary Cardiologist (physician) and Advanced Practice Providers (APPs- Physician Assistants and Nurse Practitioners) who all work together to provide you with the care you need, when you need it.   You may see any of the following providers on your designated Care Team at your next follow up: Marland Kitchen Dr Glori Bickers . Dr Loralie Champagne . Darrick Grinder, NP . Lyda Jester, PA   Please be sure to bring in all your medications bottles to every appointment.

## 2018-11-16 ENCOUNTER — Telehealth: Payer: Self-pay | Admitting: *Deleted

## 2018-11-16 ENCOUNTER — Encounter: Payer: Self-pay | Admitting: *Deleted

## 2018-11-16 NOTE — Telephone Encounter (Signed)

## 2018-11-16 NOTE — Telephone Encounter (Signed)
  This encounter was created in error - please disregard.

## 2018-11-16 NOTE — Telephone Encounter (Addendum)
Appointment for patient has been scheduled for 11/24/18 at 9:00. Patient notified via cell phone.

## 2018-11-16 NOTE — Telephone Encounter (Signed)
-----  Message from Consuelo Pandy, [place] sent at 11/15/2018  2:07 PM EDT ----- Regarding: sleep clinic evaluation Hi!  Dr. Aundra Dubin placed referral for this pt to see  Dr. Radford Pax for OSA evaluation. I am seeing him in clinic today and they said that they have not been contacted yet. I told him that he should get a call soon from [address], but I'm just checking up.  Thanks and hope you are doing well!

## 2018-11-22 ENCOUNTER — Non-Acute Institutional Stay: Payer: Medicare HMO | Admitting: Hospice

## 2018-11-22 ENCOUNTER — Other Ambulatory Visit: Payer: Self-pay

## 2018-11-22 DIAGNOSIS — Z515 Encounter for palliative care: Secondary | ICD-10-CM

## 2018-11-22 NOTE — Progress Notes (Signed)
[facility] Palliative Care Consult Note Telephone: [phone]  Fax: [phone]  PATIENT NAME: Ryan [name] DOB: 12-09-1931 MRN: [id number]  Tel: [phone]  PRIMARY CARE PROVIDER:   Chesley Noon, MD  REFERRING PROVIDER:  Chesley Noon, MD [place],  [place] [address]  RESPONSIBLE PARTY:   Ryan [name], wife [phone]  TELEHEALTH VISIT STATEMENT Due to the COVID-19 crisis, this visit was done via telephone from my office. It was initiated and consented to by this patient and/or family.   RECOMMENDATIONS/PLAN:  1. Advance Care Planning/Goals of Care: Goals include to maximize quality of life and symptom management. Visit consisted of building trust and  discussions on Palliative Medicine as specialized medical care for people living with serious illness, aimed at facilitating advance care plan, symptoms relief and establishing goals of care. Patient is a DNR. Ryan [name] said she misses going to see patient as before but she has put in place caregivers to cater for him and that he is doing well; using his CPAP, PT 3x/week for strength/stamina.  2. Symptom management: CHF: Patient denies SOB; reports edema in bil lower extremities has improved. Ryan [name] explained he has caregivers through facility's [facility], and also medication management.  3. Follow up Palliative Care Visit: Palliative care will continue to follow for goals of care clarification and symptom management.   I spent 30 minutes providing this consultation, from 3.30pm. More than 50% of the time in this consultation was spent on coordinating care communication.  HISTORY OF PRESENT ILLNESS:  Ryan [name] is a 85 y.o. year old male with multiple medical problems including CHF, COPD, pulmonary fibrosis, HTN, non hodgkin's lymphoma status post radiation. Palliative Care was asked to help address goals of care.  CODE STATUS: DNR  PPS: 50% HOSPICE  ELIGIBILITY/DIAGNOSIS: TBD  PAST MEDICAL HISTORY:  Past Medical History:  Diagnosis Date  . Asthma   . Barrett's esophagus   . Benign prostatic hypertrophy   . Bilateral lower extremity edema   . CHF (congestive heart failure) ([place])   . Chronic fatigue   . COPD (chronic obstructive pulmonary disease) ([place])   . Diverticulosis of colon (without mention of hemorrhage)   . Esophageal reflux   . Gastritis   . Gluten intolerance   . Hiatal hernia 02/10/11   Noted on CT Scan - Moderate Hiatal Hernia  . Hyperlipemia   . Hypertension   . Lymphoma of lymph nodes in pelvis ([place]) 03/03/2011    Large Right Retroperitoneal Mass  . Melanoma ([place])    lymphoma  . Microcytic anemia 04/20/2011   . 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 Osteoarthritis    hands/feet,knees, NECK, BACK  . Periaortic lymphadenopathy 02/16/2011  . Personal history of colonic polyps 2004   hyperplastic Dr. Collene Mares  . Renal cyst 02/10/11    Noted on CT Scan - Bilateral Renal Cysts  . S/P radiation therapy 03/15/11 - 04/09/11   Abdominal/ Pelvic Tumor, 3600 cGy/20 Fractions    SOCIAL HX:  Social History   Tobacco Use  . Smoking status: Former Smoker    Packs/day: 1.00    Years: 35.00    Pack years: 35.00    Types: Pipe, Cigarettes    Quit date: 02/23/1992    Years since quitting: 26.7  . Smokeless tobacco: Never Used  . Tobacco comment: quit 20 years ago  Substance Use Topics  . Alcohol  use: Yes    Comment: 2 drinks daily scotch  AND WINE WITH SUPPER    ALLERGIES:  Allergies  Allergen Reactions  . Pollen Extract-Tree Extract Other (See Comments)    HEADACHES, TIRED , DRAINAGE FROM SINUSES  . Molds & Smuts Other (See Comments)    Also dust mites causes sinus infections, h/a etc.     PERTINENT MEDICATIONS:  Outpatient Encounter Medications as of 11/22/2018  Medication Sig  . allopurinol (ZYLOPRIM) 100 MG tablet Take 50 mg by mouth daily.  . diazepam  (VALIUM) 1 MG/ML solution Take 5 mg by mouth every 8 (eight) hours as needed for anxiety.   Marland Kitchen diltiazem (CARDIZEM CD) 360 MG 24 hr capsule Take 1 capsule (360 mg total) by mouth daily.  Marland Kitchen escitalopram (LEXAPRO) 10 MG tablet Take 10 mg by mouth daily.  . finasteride (PROSCAR) 5 MG tablet Take 1 tablet (5 mg total) by mouth daily. (Patient taking differently: Take 5 mg by mouth at bedtime. )  . glipiZIDE (GLUCOTROL) 5 MG tablet Take 0.5 tablets (2.5 mg total) by mouth daily before breakfast.  . metolazone (ZAROXOLYN) 2.5 MG tablet Every Tuesday and Friday  . nystatin (MYCOSTATIN) 100000 UNIT/ML suspension Take 5 mLs by mouth 4 (four) times daily.  Marland Kitchen omeprazole (PRILOSEC) 20 MG capsule Take 20 mg by mouth daily.  . Potassium Chloride ER 20 MEQ TBCR Take 10 mEq by mouth daily.  . pramipexole (MIRAPEX) 0.75 MG tablet Take 0.75 mg by mouth at bedtime.  . Selexipag 200 MCG TABS Take 1 tablet (200 mcg total) by mouth 2 (two) times daily. (Patient not taking: Reported on 11/15/2018)  . spironolactone (ALDACTONE) 25 MG tablet Take 1 tablet (25 mg total) by mouth daily.  Marland Kitchen torsemide (DEMADEX) 100 MG tablet Take 1 tablet (100 mg total) by mouth 2 (two) times daily.  . traMADol (ULTRAM) 50 MG tablet Take 50 mg by mouth every 12 (twelve) hours as needed for moderate pain.    No facility-administered encounter medications on file as of 11/22/2018.     Teodoro Spray, NP

## 2018-11-24 ENCOUNTER — Other Ambulatory Visit: Payer: Self-pay

## 2018-11-24 ENCOUNTER — Telehealth: Payer: Self-pay | Admitting: *Deleted

## 2018-11-24 ENCOUNTER — Ambulatory Visit: Payer: Medicare HMO

## 2018-11-24 ENCOUNTER — Telehealth (INDEPENDENT_AMBULATORY_CARE_PROVIDER_SITE_OTHER): Payer: Medicare HMO | Admitting: Cardiology

## 2018-11-24 VITALS — BP 129/70 | HR 105 | Ht 67.0 in | Wt 202.0 lb

## 2018-11-24 DIAGNOSIS — R0683 Snoring: Secondary | ICD-10-CM

## 2018-11-24 DIAGNOSIS — G4719 Other hypersomnia: Secondary | ICD-10-CM | POA: Diagnosis not present

## 2018-11-24 DIAGNOSIS — I10 Essential (primary) hypertension: Secondary | ICD-10-CM | POA: Diagnosis not present

## 2018-11-24 DIAGNOSIS — I272 Pulmonary hypertension, unspecified: Secondary | ICD-10-CM | POA: Diagnosis not present

## 2018-11-24 DIAGNOSIS — I5032 Chronic diastolic (congestive) heart failure: Secondary | ICD-10-CM

## 2018-11-24 NOTE — Telephone Encounter (Signed)
Per Dr Radford Pax, I cannot find Ryan [name] sleep study  Per Albertha Ghee says patient has not taken the study, Dr Oleh Genin office will follow up with patient

## 2018-11-24 NOTE — Patient Instructions (Addendum)
Medication Instructions:  none *If you need a refill on your cardiac medications before your next appointment, please call your pharmacy*  Lab Work: none If you have labs (blood work) drawn today and your tests are completely normal, you will receive your results only by: Marland Kitchen MyChart Message (if you have MyChart) OR . A paper copy in the mail If you have any lab test that is abnormal or we need to change your treatment, we will call you to review the results.  Testing/Procedures: none  Follow-Up:

## 2018-11-24 NOTE — Progress Notes (Signed)
Virtual Visit via Telephone Note   This visit type was conducted due to national recommendations for restrictions regarding the COVID-19 Pandemic (e.g. social distancing) in an effort to limit this patient's exposure and mitigate transmission in our community.  Due to his co-morbid illnesses, this patient is at least at moderate risk for complications without adequate follow up.  This format is felt to be most appropriate for this patient at this time.  All issues noted in this document were discussed and addressed.  A limited physical exam was performed with this format.  Please refer to the patient's chart for his consent to telehealth for [facility].   Evaluation Performed:  Follow-up visit  This visit type was conducted due to national recommendations for restrictions regarding the COVID-19 Pandemic (e.g. social distancing).  This format is felt to be most appropriate for this patient at this time.  All issues noted in this document were discussed and addressed.  No physical exam was performed (except for noted visual exam findings with Video Visits).  Please refer to the patient's chart (MyChart message for video visits and phone note for telephone visits) for the patient's consent to telehealth for [facility].  Date:  11/25/2018   ID:  Ryan [name], DOB [birth year], MRN [id number]  Patient Location:  Home  Provider location:   [place]  PCP:  Chesley Noon, MD  Cardiologist:  [name], MD  Sleep Medicine:  Fransico Him, MD Electrophysiologist:  None   Chief Complaint:  OSA  History of Present Illness:    Ryan [name] is a 85 y.o. male who presents via audio/video conferencing for a telehealth visit today.    This is an 85yo male with a hx of HTN, CHF, COPD on O2 who was referred for a home sleep study through [facility].  He performed the home sleep study and sent it back for interpretation but the results were unreadable.  A repeat study was obtained which  he completed and sent it back but thinks he sent it back to the wrong place about a week ago.  He apparently was using a loaner CPAP from the hospital although has not had any sleep study done to dx OSA.  He does have problems with snoring as well as excessive daytime sleepiness.   The patient does not have symptoms concerning for COVID-19 infection (fever, chills, cough, or new shortness of breath).    Prior CV studies:   The following studies were reviewed today:  none  Past Medical History:  Diagnosis Date  . Asthma   . Barrett's esophagus   . Benign prostatic hypertrophy   . Bilateral lower extremity edema   . CHF (congestive heart failure) ([place])   . Chronic fatigue   . COPD (chronic obstructive pulmonary disease) ([place])   . Diverticulosis of colon (without mention of hemorrhage)   . Esophageal reflux   . Gastritis   . Gluten intolerance   . Hiatal hernia 02/10/11   Noted on CT Scan - Moderate Hiatal Hernia  . Hyperlipemia   . Hypertension   . Lymphoma of lymph nodes in pelvis ([place]) 03/03/2011    Large Right Retroperitoneal Mass  . Melanoma ([place])    lymphoma  . Microcytic anemia 04/20/2011   . 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 Osteoarthritis    hands/feet,knees, NECK, BACK  . Periaortic lymphadenopathy 02/16/2011  . Personal history of colonic polyps 2004   hyperplastic  Dr. Collene Mares  . Renal cyst 02/10/11    Noted on CT Scan - Bilateral Renal Cysts  . S/P radiation therapy 03/15/11 - 04/09/11   Abdominal/ Pelvic Tumor, 3600 cGy/20 Fractions   Past Surgical History:  Procedure Laterality Date  . ARTHROSCOPIC REPAIR ACL     right  . BONE MARROW ASPIRATION  02/25/11   Bone Marrow, Aspirate, Clot, and Bilateral Bx, Right PIC  . CATARACT EXTRACTION, BILATERAL    . EYE SURGERY    . HERNIA REPAIR     LEFT INGUINAL   . INSERTION OF MESH N/A 08/23/2012   Procedure: INSERTION OF MESH;  Surgeon: Madilyn Hook, DO;  Location:  [facility];  Service: General;  Laterality: N/A;  . LUMBAR LAMINECTOMY/DECOMPRESSION MICRODISCECTOMY Right 12/31/2013   Procedure: RIGHT L4-5 L5-S1 LAMINECTOMY;  Surgeon: Kristeen Miss, MD;  Location: [facility];  Service: Neurosurgery;  Laterality: Right;  RIGHT L4-5 L5-S1 LAMINECTOMY  . RIGHT HEART CATH N/A 07/25/2018   Procedure: RIGHT HEART CATH;  Surgeon: Larey Dresser, MD;  Location: [facility] CV [facility];  Service: Cardiovascular;  Laterality: N/A;  . ROTATOR CUFF REPAIR     right  . TONSILLECTOMY    . TONSILLECTOMY    . VENTRAL HERNIA REPAIR N/A 08/23/2012   Procedure: HERNIA REPAIR VENTRAL ADULT;  Surgeon: Madilyn Hook, DO;  Location: [facility];  Service: General;  Laterality: N/A;     Current Meds  Medication Sig  . allopurinol (ZYLOPRIM) 100 MG tablet Take 50 mg by mouth daily.  . diazepam (VALIUM) 5 MG tablet Take 5 mg by mouth 2 (two) times daily as needed.  . diltiazem (CARDIZEM CD) 360 MG 24 hr capsule Take 1 capsule (360 mg total) by mouth daily.  Marland Kitchen escitalopram (LEXAPRO) 10 MG tablet Take 10 mg by mouth daily.  . finasteride (PROSCAR) 5 MG tablet Take 1 tablet (5 mg total) by mouth daily. (Patient taking differently: Take 5 mg by mouth at bedtime. )  . glipiZIDE (GLUCOTROL) 5 MG tablet Take 0.5 tablets (2.5 mg total) by mouth daily before breakfast.  . metolazone (ZAROXOLYN) 2.5 MG tablet Every Tuesday and Friday  . omeprazole (PRILOSEC) 20 MG capsule Take 20 mg by mouth daily.  . Potassium Chloride ER 20 MEQ TBCR Take 10 mEq by mouth daily.  . pramipexole (MIRAPEX) 0.75 MG tablet Take 0.75 mg by mouth at bedtime.  . Selexipag 200 MCG TABS Take 1 tablet (200 mcg total) by mouth 2 (two) times daily.  Marland Kitchen spironolactone (ALDACTONE) 25 MG tablet Take 1 tablet (25 mg total) by mouth daily.  Marland Kitchen torsemide (DEMADEX) 100 MG tablet Take 1 tablet (100 mg total) by mouth 2 (two) times daily.  . traMADol (ULTRAM) 50 MG tablet Take 50 mg by mouth every 12 (twelve) hours as needed for moderate  pain.      Allergies:   Pollen extract-tree extract and Molds & smuts   Social History   Tobacco Use  . Smoking status: Former Smoker    Packs/day: 1.00    Years: 35.00    Pack years: 35.00    Types: Pipe, Cigarettes    Quit date: 02/23/1992    Years since quitting: 26.7  . Smokeless tobacco: Never Used  . Tobacco comment: quit 20 years ago  Substance Use Topics  . Alcohol use: Yes    Comment: 2 drinks daily scotch  AND WINE WITH SUPPER  . Drug use: No     Family Hx: The patient's family history includes ADD /  ADHD in an other family member; Heart disease in his father; Hyperlipidemia in an other family member; Hypertension in an other family member; Prostate cancer in his brother, paternal uncle, paternal uncle, and paternal uncle; Stroke in an other family member; Urticaria in his father. There is no history of Colon cancer, Allergic rhinitis, Asthma, or Eczema.  ROS:   Please see the history of present illness.     All other systems reviewed and are negative.   Labs/Other Tests and Data Reviewed:    Recent Labs: 09/07/2018: ALT 27 09/26/2018: Hemoglobin 14.3; Magnesium 2.0; Platelets 257 10/17/2018: B Natriuretic Peptide 77.6 11/15/2018: BUN 33; Creatinine, Ser 1.50; Potassium 4.2; Sodium 137   Recent Lipid Panel Lab Results  Component Value Date/Time   CHOL 188 04/01/2014 09:10 AM   TRIG 166 (H) 04/01/2014 09:10 AM   TRIG 76 11/23/2005 09:11 AM   HDL 52 04/01/2014 09:10 AM   CHOLHDL 3.6 04/01/2014 09:10 AM   LDLCALC 103 (H) 04/01/2014 09:10 AM   LDLDIRECT 165.7 12/23/2008 11:13 AM    Wt Readings from Last 3 Encounters:  11/24/18 202 lb (91.6 kg)  11/15/18 202 lb 3.2 oz (91.7 kg)  11/06/18 199 lb 9.6 oz (90.5 kg)     Objective:    Vital Signs:  BP 129/70   Pulse (!) 105   Ht _0  (1.702 m)   Wt 202 lb (91.6 kg)   SpO2 97%   BMI 31.64 kg/m     ASSESSMENT & PLAN:    1.  Excessive daytime sleepiness -he completed an [facility] home sleep study but the  data were bad -he completed a second study a week ago but thinks he sent the device back to the wrong address -I will check with [facility] to see if they have received his sleep study  2.  HTN -BP controlled -continue Cardizem CD 33m daily, spiro 274mdaily  3.  Pulmonary HTN -denies any SOB -Moderate PHTN on RHC 07/2018  -has been intolerant to Adcirca and Letairis -continue diuretics  COVID-19 Education: The signs and symptoms of COVID-19 were discussed with the patient and how to seek care for testing (follow up with PCP or arrange E-visit).  The importance of social distancing was discussed today.  Patient Risk:   After full review of this patient's clinical status, I feel that they are at least moderate risk at this time.  Time:   Today, I have spent 20 minutes directly with the patient on telemedicine discussing medical problems including OSA< HTN.  We also reviewed the symptoms of COVID 19 and the ways to protect against contracting the virus with telehealth technology.  I spent an additional 5 minutes reviewing patient's chart including office notes.  Medication Adjustments/Labs and Tests Ordered: Current medicines are reviewed at length with the patient today.  Concerns regarding medicines are outlined above.  Tests Ordered: No orders of the defined types were placed in this encounter.  Medication Changes: No orders of the defined types were placed in this encounter.   Disposition:  Follow up prn after sleep study  Signed, TrFransico HimMD  11/25/2018 3:12 PM    [place]

## 2018-12-12 ENCOUNTER — Telehealth: Payer: Medicare HMO | Admitting: Cardiology

## 2018-12-18 ENCOUNTER — Telehealth (HOSPITAL_COMMUNITY): Payer: Self-pay | Admitting: Pharmacist

## 2018-12-18 NOTE — Telephone Encounter (Signed)
Continue to [employer].

## 2018-12-18 NOTE — Telephone Encounter (Signed)
Received message from Ronalee Belts, pharmacist at [facility] that Mr. Berkovich had noted some insomnia/trouble sleeping since starting the Uptravi. I was able to speak with Ms. Gobert and while she noted that he had been having difficulty sleeping, this was a chronic problem for him and she is not sure it is related to the [place]. Additionally, he is starting to use his CPAP machine more often.   She was extremely pleased with the progress he has had while on the Uptravi. She noted that he feels much better, his mental acuity is better and he is less confused. He also has more stamina and has been able to use his walker more than his wheelchair lately. All of this has helped his mood improve.   Based on this conversation, it appears the benefits to continuing to up-titrate the Uptravi outweigh the risks at this time. He is currently taking Uptravi 600 mg BID. Will continue this dose with up-tritration per home nursing protocol. Will continue to re-evaluate to make sure the patient is tolerating the medication.   Audry Riles, PharmD, BCPS, CPP [facility] Pharmacist [phone]

## 2018-12-19 ENCOUNTER — Encounter (HOSPITAL_COMMUNITY): Payer: Self-pay | Admitting: Cardiology

## 2018-12-19 ENCOUNTER — Other Ambulatory Visit: Payer: Self-pay

## 2018-12-19 ENCOUNTER — Ambulatory Visit (HOSPITAL_COMMUNITY)
Admission: RE | Admit: 2018-12-19 | Discharge: 2018-12-19 | Disposition: A | Discharge status: Home / Self Care | Payer: Medicare HMO | Source: Ambulatory Visit | Attending: Cardiology | Admitting: Cardiology

## 2018-12-19 VITALS — BP 103/87 | HR 74 | Wt 200.2 lb

## 2018-12-19 DIAGNOSIS — I130 Hypertensive heart and chronic kidney disease with heart failure and stage 1 through stage 4 chronic kidney disease, or unspecified chronic kidney disease: Secondary | ICD-10-CM | POA: Insufficient documentation

## 2018-12-19 DIAGNOSIS — I272 Pulmonary hypertension, unspecified: Secondary | ICD-10-CM | POA: Diagnosis not present

## 2018-12-19 DIAGNOSIS — J8410 Pulmonary fibrosis, unspecified: Secondary | ICD-10-CM

## 2018-12-19 DIAGNOSIS — Z87891 Personal history of nicotine dependence: Secondary | ICD-10-CM | POA: Diagnosis not present

## 2018-12-19 DIAGNOSIS — Z79899 Other long term (current) drug therapy: Secondary | ICD-10-CM | POA: Insufficient documentation

## 2018-12-19 DIAGNOSIS — G4733 Obstructive sleep apnea (adult) (pediatric): Secondary | ICD-10-CM | POA: Diagnosis not present

## 2018-12-19 DIAGNOSIS — Z8719 Personal history of other diseases of the digestive system: Secondary | ICD-10-CM | POA: Diagnosis not present

## 2018-12-19 DIAGNOSIS — N183 Chronic kidney disease, stage 3 unspecified: Secondary | ICD-10-CM | POA: Diagnosis not present

## 2018-12-19 DIAGNOSIS — I5032 Chronic diastolic (congestive) heart failure: Secondary | ICD-10-CM | POA: Diagnosis present

## 2018-12-19 DIAGNOSIS — K219 Gastro-esophageal reflux disease without esophagitis: Secondary | ICD-10-CM | POA: Insufficient documentation

## 2018-12-19 DIAGNOSIS — Z7984 Long term (current) use of oral hypoglycemic drugs: Secondary | ICD-10-CM | POA: Diagnosis not present

## 2018-12-19 DIAGNOSIS — I2721 Secondary pulmonary arterial hypertension: Secondary | ICD-10-CM | POA: Diagnosis not present

## 2018-12-19 LAB — BASIC METABOLIC PANEL
Anion gap: 12 (ref 5–15)
BUN: 30 mg/dL — ABNORMAL HIGH (ref 8–23)
CO2: 30 mmol/L (ref 22–32)
Calcium: 8.6 mg/dL — ABNORMAL LOW (ref 8.9–10.3)
Chloride: 94 mmol/L — ABNORMAL LOW (ref 98–111)
Creatinine, Ser: 1.41 mg/dL — ABNORMAL HIGH (ref 0.61–1.24)
GFR calc Af Amer: 52 mL/min — ABNORMAL LOW (ref >60)
GFR calc non Af Amer: 45 mL/min — ABNORMAL LOW (ref >60)
Glucose, Bld: 90 mg/dL (ref 70–99)
Potassium: 4.3 mmol/L (ref 3.5–5.1)
Sodium: 136 mmol/L (ref 135–145)

## 2018-12-19 LAB — BRAIN NATRIURETIC PEPTIDE: B Natriuretic Peptide: 36.2 pg/mL (ref 0.0–100.0)

## 2018-12-19 NOTE — Patient Instructions (Signed)
Labs today, we will contact you for abnormal results  Your physician recommends that you schedule a follow-up appointment in: 6 weeks  We will reach out to Dr Theodosia Blender office regarding sleep study  If you have any questions or concerns before your next appointment please send [place] a message through [facility] or call our office at [phone].  At the [facility], you and your health needs are our priority. As part of our continuing mission to provide you with exceptional heart care, we have created designated Provider Care Teams. These Care Teams include your primary Cardiologist (physician) and Advanced Practice Providers (APPs- Physician Assistants and Nurse Practitioners) who all work together to provide you with the care you need, when you need it.   You may see any of the following providers on your designated Care Team at your next follow up: Marland Kitchen Dr Glori Bickers . Dr Loralie Champagne . Darrick Grinder, NP . Lyda Jester, PA   Please be sure to bring in all your medications bottles to every appointment.

## 2018-12-20 ENCOUNTER — Telehealth (HOSPITAL_COMMUNITY): Payer: Self-pay | Admitting: Surgery

## 2018-12-20 NOTE — Telephone Encounter (Signed)
I attempted to contact and left message for patient regarding pending incomplete sleep study and equipment.  I requested a return call.

## 2018-12-20 NOTE — Progress Notes (Signed)
PCP: Roetta Sessions, NP Cardiology: Dr. Aundra Dubin  85 y.o. with history of interstitial lung disease (?NSIP vs UIP) on home oxygen, pulmonary hypertension, diastolic CHF, and prior non-Hodgkins lymphoma (pelvic mass, s/p XRT) presents for followup of CHF and pulmonary hypertension.  Patient has history of reportedly mild ILD (NSIP vs UIP) followed by a pulmonologist at [facility] and pulmonary hypertension thought to be out of proportion lung disease followed by a physician at [facility] ([place]).  Last CT chest was in 11/19, showing stable ILD (?UIP vs NSIP).  He had RHC in 2/20 with moderate PAH, PVR 5.1 WU.  He was tried on Adcirca but developed cognitive problems and worsening of peripheral edema so this was stopped.  He has been on home oxygen, uses at night and with exertion.   He was admitted in 6/20 with worsening dyspnea and peripheral edema, weight up despite compliance with torsemide 40 mg bid at home.  He has CKD stage 3 and torsemide was decreased in the spring. He was diuresed aggressively with fall in weight.  Echo showed normal LV size and systolic function, RV reportedly normal.  6/20 RHC showed normal filling pressures with moderate pulmonary hypertension, PVR 5.35 WU.   After discharge from hospital, patient started on ambrisentan.  His weight steadily began to increase and he developed worsening lower extremity edema, we stopped ambrisentan.   Patient was admitted in 8/20 with AKI after taking metolazone for several days.  He was admitted again in 8/20 with acute on chronic diastolic CHF. He was diuresed.  Wandering atrial pacemaker noted.   He seems to be doing better now that he has started [place].  He is taking 600 mcg bid.  He is exercising more, now can walk the length of his assisted living building with a walker without dyspnea.  Weight is stable.  No orthopnea/PND.  Per his wife, mental clarity and attitude are better.  He says that he is able to play computer chess again.  He never  had a sleep study, was put on CPAP at last hospitalization.   Labs (6/20): K 4.7, creatinine 1.46 Labs (7/20): K 3.1, creatinine 1.67 Labs (8/20): K 3.9, creatinine 1.8 Labs (9/20): K 4.9, creatinine 1.76, BNP 78 Labs (10/20): K 4.2, creatininine 1.5  PMH: 1. Non-Hodgkin's lymphoma: Pelvic involvement.  S/p radiation.  Thought to be in remission (Dr. Benay Spice).  2. HTN 3. PACs/PVCs 4. GERD: Barrett's esophagus.  5. 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  - High resolution CT chest 11/19: ILD, ?UIP pattern.  6. Pulmonary hypertension: Thought to be out of proportion to underlying lung disease (ILD, ?NSIP vs UIP).   - RHC (2/20): mean RA 10, PASP 60 with mean 42, mean PCWP 11, CI 3, PVR 5.1 WU.  - Trial of Adcirca => failed due to ?memory worsening/cognitive affects and peripheral edema.  - Echo (6/20): EF 60-65%, mild LVH, RV reportedly normal, PASP 59 mmHg.  - RHC (6/20): mean RA 7, PA 50/24 mean 34, mean PCWP 12, CI 2.01, PAPI 3.7, PVR 5.35 WU - RF negative, ANA negative, anti-SCL70 negative, anti-centromere negative.  - V/Q scan (8/20): No evidence for chronic PE.  7. CKD: Stage 3.   8. Wandering atrial pacemaker 9. OSA  Social History   Socioeconomic History  . Marital status: Married    Spouse name: Not on file  . Number of children: 2  . Years of education: Not on file  . Highest education level: Not on file  Occupational History  .  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 Financial resource strain: Not on file  . Food insecurity    Worry: Not on file    Inability: Not on file  . Transportation needs    Medical: Not on file    Non-medical: Not on file  Tobacco Use  . Smoking status: Former Smoker    Packs/day: 1.00    Years: 35.00    Pack years: 35.00    Types: Pipe, Cigarettes    Quit date: 02/23/1992    Years since quitting: 26.8  . Smokeless tobacco: Never Used  . Tobacco comment: quit 20 years ago  Substance and Sexual  Activity  . Alcohol use: Yes    Comment: 2 drinks daily scotch  AND WINE WITH SUPPER  . Drug use: No  . Sexual activity: Not on file  Lifestyle  . Physical activity    Days per week: Not on file    Minutes per session: Not on file  . Stress: Not on file  Relationships  . Social Herbalist on phone: Not on file    Gets together: Not on file    Attends religious service: Not on file    Active member of club or organization: Not on file    Attends meetings of clubs or organizations: Not on file    Relationship status: Not on file  . Intimate partner violence    Fear of current or ex partner: Not on file    Emotionally abused: Not on file    Physically abused: Not on file    Forced sexual activity: Not on file  Other Topics Concern  . Not on file  Social History Narrative   Married - second marriage   He has two children   Retired Horticulturist, commercial Professor   Currently teaches pottery making, 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[facility]   Former Smoker quit 12 years ago- smoked for 35 years   Alcohol use- yes   Family History  Problem Relation Age of Onset  . Heart disease Father        MI 68  . Urticaria Father   . Prostate cancer Brother   . Prostate cancer Paternal Uncle   . Prostate cancer Paternal Uncle   . Prostate cancer Paternal Uncle   . Hyperlipidemia Other   . Stroke Other   . Hypertension Other   . ADD / ADHD Other   . Colon cancer Neg Hx   . Allergic rhinitis Neg Hx   . Asthma Neg Hx   . Eczema Neg Hx    ROS: All systems reviewed and negative except as per HPI.   Current Outpatient Medications  Medication Sig Dispense Refill  . allopurinol (ZYLOPRIM) 100 MG tablet Take 50 mg by mouth daily.    . diazepam (VALIUM) 5 MG tablet Take 5 mg by mouth 2 (two) times daily as needed.    . diltiazem (CARDIZEM CD) 360 MG 24 hr capsule Take 1 capsule (360 mg total) by mouth daily. 90 capsule 3  . escitalopram (LEXAPRO) 10 MG tablet Take 10 mg by mouth daily.    .  finasteride (PROSCAR) 5 MG tablet Take 1 tablet (5 mg total) by mouth daily. (Patient taking differently: Take 5 mg by mouth at bedtime. ) 30 tablet 5  . FLONASE SENSIMIST 27.5 MCG/SPRAY nasal spray 1 spray daily.    Marland Kitchen glipiZIDE (GLUCOTROL) 5 MG tablet Take 0.5 tablets (2.5  mg total) by mouth daily before breakfast. 15 tablet 0  . latanoprost (XALATAN) 0.005 % ophthalmic solution INSTILL ONE DROP BY OPHTHALMIC ROUTE EVERY NIGHT AT BEDTIME INTO AFFECTED EYE    . metolazone (ZAROXOLYN) 2.5 MG tablet Every Tuesday and Friday 30 tablet 3  . omeprazole (PRILOSEC) 20 MG capsule Take 20 mg by mouth daily.    . Potassium Chloride ER 20 MEQ TBCR Take 10 mEq by mouth daily. 90 tablet 3  . rOPINIRole (REQUIP) 0.5 MG tablet Take 0.5 mg by mouth at bedtime.    . Selexipag 200 MCG TABS Take 1 tablet (200 mcg total) by mouth 2 (two) times daily. 60 tablet 2  . spironolactone (ALDACTONE) 25 MG tablet Take 1 tablet (25 mg total) by mouth daily. 30 tablet 0  . torsemide (DEMADEX) 100 MG tablet Take 1 tablet (100 mg total) by mouth 2 (two) times daily. 270 tablet 6  . traMADol (ULTRAM) 50 MG tablet Take 50 mg by mouth every 12 (twelve) hours as needed for moderate pain.      No current facility-administered medications for this encounter.    BP 103/87   Pulse 74   Wt 90.8 kg (200 lb 3.2 oz)   SpO2 98%   BMI 31.36 kg/m  General: NAD Neck: No JVD, no thyromegaly or thyroid nodule.  Lungs: Crackles at bases.  CV: Nondisplaced PMI.  Heart regular S1/S2, no S3/S4, no murmur.  1+ ankle edema.  No carotid bruit.  Normal pedal pulses.  Abdomen: Soft, nontender, no hepatosplenomegaly, no distention.  Skin: Intact without lesions or rashes.  Neurologic: Alert and oriented x 3.  Psych: Normal affect. Extremities: No clubbing or cyanosis.  HEENT: Normal.   Assessment/Plan: 1. Chronic diastolic CHF: Suspect with significant component of RV failure from pulmonary hypertension. Echo in 6/20 with EF 60-65%, RV not  well-visualized but PASP 59 mmHg.  Currently doing better.  Weight stable, he does not look volume overloaded on exam.  NYHA class II-III.    - Continue torsemide 100 mg bid and metolazone 2.5 twice a week.  BMET today.  - Continue spironolactone 25 mg daily.  2. CKD Stage 3:  BMET today as above.  3. Pulmonary hypertnesion: Patient thought to have [place] out of proportion to his lung disease (ILD, NSIP versus UIP). RHC in 2/20 showed mean PA pressure 41, PVR 5.1 WU, CI 3. Echo in 6/20 with RV reportedly ok =>I reviewed and do not think that RV was well-visualized. Patient did not tolerate Adcirca due to edema/?cognitive effects.  RHC was done again in 6/20, showed moderate pulmonary arterial hypertension with PVR 5.35 (comparable to prior).  Serological workup for PH was negative. Patient started ambrisentan 5 mg and developed significant edema/volume overload despite a high dose of torsemide.  I am concerned that ambrisentan may have been part of the reason for the peripheral edema/volume overload.  He is now on Uptravi and feels like he is doing better overall.   - he will stay off ambrisentan (?trigger for volume overload).  - As above, he did not tolerate tadalafil.  - Continue Uptravi, titrate up as able. - Unable to do 6 minute walk today as he did not bring his walker, will bring his walker next appt.  - Check BNP.   4. Wandering atrial pacemaker: He is on diltiazem CD 360 mg daily.  5. OSA: Has CPAP.  Having trouble using it. He had a home sleep study to take but was unable to successfully complete.  I think he is going to need a formal sleep medicine consult, have referred to Dr. Radford Pax.   Followup in 6 wks.   Loralie Champagne 12/20/2018

## 2018-12-21 ENCOUNTER — Other Ambulatory Visit: Payer: Self-pay

## 2018-12-24 NOTE — Procedures (Signed)
    Sleep Study Report  Patient Information Name: Ryan [name] ID: [id number] Birth Date: 1932-01-21  Age: 85  Gender: Male Study Date:10/24/2018 Referring Physician: Loralie Champagne, MD  TEST DESCRIPTION: Home sleep apnea testing was completed using the WatchPat, a Type 1 device, utilizing peripheral arterial tonometry (PAT), chest movement, actigraphy, pulse oximetry, pulse rate, body position and snore. AHI was calculated with apnea and hypopnea using valid sleep time as the denominator. RDI includes apneas, hypopneas, and RERAs. The data acquired and the scoring of sleep and all associated events were performed in accordance with the recommended standards and specifications as outlined in the AASM Manual for the Scoring of Sleep and Associated Events 2.2.0 (2015).  FINDINGS: 1. Severe Obstructive Sleep Apnea (G47.33) with AHI 42.8/hr. 2. No significant Central Sleep Apnea with pAHIc 0.9/hr. 3. The lowest O2 sat was 74%. Nocturnal Hypoxemia present with 47.1 minutes spent with O2 sats < 88%. 4. The average heart rate was 80bpm and ranged from 36 to 130bpm. Wide fluctuations in HR suggestive of underlying atrial fibrillation. 5. Moderate snoring. 6. Short sleep onset latency at 5 minutes. 7. Prolonged REM sleep onset at 139 minutes with reduced REM sleep at 3% of total sleep time. 8. Total sleep time was 5 hours 31 minutes. 9. There were 38 awakenings during the study.  DIAGNOSIS: Severe Obstructive Sleep Apnea (G47.33) Nocturnal Hypoxemia  RECOMMENDATIONS: 1. Consider continuous positive airway pressure (CPAP) as the initial treatment of choice for severe obstructive sleep apnea. If the patient chooses CPAP therapy, a nocturnal PSG with CPAP titration is recommended. As an alternative, an Auto PAP with pressure range 5-20cm H2O with download is an option. Consider PAP interface (mask) fitted for patient comfort, heated humidification and PAP compliance monitoring (1 month, 3  months & 12 months after PAP Initiation).  2. Positive airway pressure therapy (PAP) is the first line of treatment for patients with severe OSA. Alternative treatment for severe OSA in patients who cannot tolerate and have failed or refused CPAP therapy includes :   a. The patient may benefit from the use of nocturnal mandibular repositioning appliance. If that line of therapy is to be pursed, the patient should be evaluated by a dentist trained in the treatment of sleep related breathing disorders.   b. An ENT consultation which may be useful to look for specific causes of obstruction and possible treatment Options.   c. If patient is intolerant to PAP therapy, consider referral to ENT for evaluation for hypoglossal nerve stimulator.  3. Weight loss may be of benefit in reducing the severity of respiratory events and snoring .  4. Routine follow-up efficacy testing should be performed.  Report prepared by: Signature: Fransico Him Electronically Signed: December 24, 2018

## 2018-12-26 ENCOUNTER — Telehealth: Payer: Self-pay | Admitting: *Deleted

## 2018-12-26 NOTE — Telephone Encounter (Signed)
-----  Message from Sueanne Margarita, MD sent at 12/24/2018  5:51 PM EST ----- Please let patient know that they have sleep apnea and recommend auto CPAP titration through [facility].  Orders have been placed in Epic. Please set 10 week OV with me.

## 2018-12-26 NOTE — Telephone Encounter (Addendum)
Informed patient of sleep study results and patient understanding was verbalized. Patient understands his sleep study showed they have sleep apnea and recommend auto CPAP titration through [facility]. Orders have been placed in Epic. Please set 10 week OV with me.   Patient states he already has a cpap unit that he got from a local company sometime ago. Patient does not want to purchase another unit. I explained to the patient that [facility] was located in [place] and will not able to apply the settings on his unit. Patient was encouraged to call back with the name of the local dme where he got his unit.

## 2018-12-27 NOTE — Telephone Encounter (Signed)
Patient called back to say he has decided to get his cpap through [facility]. Order has been placed by electronic fax to [facility].

## 2019-01-08 ENCOUNTER — Telehealth (HOSPITAL_COMMUNITY): Payer: Self-pay | Admitting: Pharmacist

## 2019-01-08 MED ORDER — SELEXIPAG 1000 MCG PO TABS: 1000.0000 ug | ORAL_TABLET | Freq: Two times a day (BID) | ORAL | 0 refills | Status: DC

## 2019-01-08 NOTE — Telephone Encounter (Signed)
Spoke with Ronalee Belts from [facility] regarding [name]. He is currently taking Uptravi 1000 mcg BID and doing well. Updated patient medication list with new dose.   Audry Riles, PharmD, BCPS, BCCP, CPP Heart Failure Clinic Pharmacist [phone]

## 2019-01-16 ENCOUNTER — Other Ambulatory Visit: Payer: Self-pay

## 2019-01-16 ENCOUNTER — Other Ambulatory Visit: Payer: Medicare HMO | Admitting: Hospice

## 2019-01-16 DIAGNOSIS — I5032 Chronic diastolic (congestive) heart failure: Secondary | ICD-10-CM

## 2019-01-16 DIAGNOSIS — Z515 Encounter for palliative care: Secondary | ICD-10-CM

## 2019-01-16 NOTE — Progress Notes (Addendum)
[facility] Consult Note Telephone: [phone]  Fax: [phone]  PATIENT NAME: Ryan [name] DOB: 10/27/1931 MRN: [id number]  PRIMARY CARE PROVIDER:   Roetta Sessions, NP  REFERRING PROVIDER:  Roetta Sessions, NP [address] [place],  [place] [address]  RESPONSIBLE PARTY:Jane Kimes, wife [phone]  TELEHEALTH VISIT STATEMENT Due to the COVID-19 crisis, this visit was done via telephone from my office. It was initiated and consented to by this patient and/or family. RECOMMENDATIONS/PLAN:  1. Advance Care Planning/Goals of Care: Patient remains a DNR with goals of care including to maximize quality of life and symptom management. Visit consisted of building trust and checking patient's health status. Called [name] to update; left her a voicemail with call back number.  2. Symptom management: CHF: Patient denies coughing or SOB; endorses weakness when he is walking; reports edema in bil lower extremities has improved. Education on rest in between activities and taking diuretic as ordered. Patient affirmed he has caregivers through facility's [facility], and also medication management. He also said he stopped using his CPAP because it had the wrong setting and he is waiting on his sleep Doctor on another one. He said he has appointment with his PCP ([facility]) on Monday 01/22/2019. 3. Follow up Palliative Care Visit: Palliative care will continue to follow for goals of care clarification and symptom management.  I spent 30 minutes providing this consultation, from 1.30pm to 2.00pm. More than 50% of the time in this consultation was spent on coordinating care communication. HISTORY OF PRESENT ILLNESS:Ryan [name] a 85 y.o.year oldmalewith multiple medical problems including CHF, COPD, pulmonary fibrosis, HTN, non hodgkin's lymphoma status post radiation. Palliative Care was asked  to help address goals of care.  CODE STATUS: DNR  PPS: 50% HOSPICE ELIGIBILITY/DIAGNOSIS: TBD  PAST MEDICAL HISTORY:  Past Medical History:  Diagnosis Date  . Asthma   . Barrett's esophagus   . Benign prostatic hypertrophy   . Bilateral lower extremity edema   . CHF (congestive heart failure) ([place])   . Chronic fatigue   . COPD (chronic obstructive pulmonary disease) ([place])   . Diverticulosis of colon (without mention of hemorrhage)   . Esophageal reflux   . Gastritis   . Gluten intolerance   . Hiatal hernia 02/10/11   Noted on CT Scan - Moderate Hiatal Hernia  . Hyperlipemia   . Hypertension   . Lymphoma of lymph nodes in pelvis ([place]) 03/03/2011    Large Right Retroperitoneal Mass  . Melanoma ([place])    lymphoma  . Microcytic anemia 04/20/2011   . 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 Osteoarthritis    hands/feet,knees, NECK, BACK  . Periaortic lymphadenopathy 02/16/2011  . Personal history of colonic polyps 2004   hyperplastic Dr. Collene Mares  . Renal cyst 02/10/11    Noted on CT Scan - Bilateral Renal Cysts  . S/P radiation therapy 03/15/11 - 04/09/11   Abdominal/ Pelvic Tumor, 3600 cGy/20 Fractions    SOCIAL HX:  Social History   Tobacco Use  . Smoking status: Former Smoker    Packs/day: 1.00    Years: 35.00    Pack years: 35.00    Types: Pipe, Cigarettes    Quit date: 02/23/1992    Years since quitting: 26.9  . Smokeless tobacco: Never Used  . Tobacco comment: quit 20 years ago  Substance Use Topics  . Alcohol  use: Yes    Comment: 2 drinks daily scotch  AND WINE WITH SUPPER    ALLERGIES:  Allergies  Allergen Reactions  . Pollen Extract-Tree Extract Other (See Comments)    HEADACHES, TIRED , DRAINAGE FROM SINUSES  . Molds & Smuts Other (See Comments)    Also dust mites causes sinus infections, h/a etc.     PERTINENT MEDICATIONS:  Outpatient Encounter Medications as of 01/16/2019  Medication Sig  .  allopurinol (ZYLOPRIM) 100 MG tablet Take 50 mg by mouth daily.  . diazepam (VALIUM) 5 MG tablet Take 5 mg by mouth 2 (two) times daily as needed.  . diltiazem (CARDIZEM CD) 360 MG 24 hr capsule Take 1 capsule (360 mg total) by mouth daily.  Marland Kitchen escitalopram (LEXAPRO) 10 MG tablet Take 10 mg by mouth daily.  . finasteride (PROSCAR) 5 MG tablet Take 1 tablet (5 mg total) by mouth daily. (Patient taking differently: Take 5 mg by mouth at bedtime. )  . FLONASE SENSIMIST 27.5 MCG/SPRAY nasal spray 1 spray daily.  Marland Kitchen glipiZIDE (GLUCOTROL) 5 MG tablet Take 0.5 tablets (2.5 mg total) by mouth daily before breakfast.  . latanoprost (XALATAN) 0.005 % ophthalmic solution INSTILL ONE DROP BY OPHTHALMIC ROUTE EVERY NIGHT AT BEDTIME INTO AFFECTED EYE  . metolazone (ZAROXOLYN) 2.5 MG tablet Every Tuesday and Friday  . omeprazole (PRILOSEC) 20 MG capsule Take 20 mg by mouth daily.  . Potassium Chloride ER 20 MEQ TBCR Take 10 mEq by mouth daily.  Marland Kitchen rOPINIRole (REQUIP) 0.5 MG tablet Take 0.5 mg by mouth at bedtime.  . Selexipag 1000 MCG TABS Take 1,000 mcg by mouth 2 (two) times daily.  Marland Kitchen spironolactone (ALDACTONE) 25 MG tablet Take 1 tablet (25 mg total) by mouth daily.  Marland Kitchen torsemide (DEMADEX) 100 MG tablet Take 1 tablet (100 mg total) by mouth 2 (two) times daily.  . traMADol (ULTRAM) 50 MG tablet Take 50 mg by mouth every 12 (twelve) hours as needed for moderate pain.    No facility-administered encounter medications on file as of 01/16/2019.    Teodoro Spray, NP

## 2019-01-31 ENCOUNTER — Ambulatory Visit (HOSPITAL_COMMUNITY)
Admission: RE | Admit: 2019-01-31 | Discharge: 2019-01-31 | Disposition: A | Discharge status: Home / Self Care | Payer: Medicare HMO | Source: Ambulatory Visit | Attending: Cardiology | Admitting: Cardiology

## 2019-01-31 ENCOUNTER — Encounter (HOSPITAL_COMMUNITY): Payer: Self-pay | Admitting: Cardiology

## 2019-01-31 ENCOUNTER — Telehealth (HOSPITAL_COMMUNITY): Payer: Self-pay | Admitting: Pharmacist

## 2019-01-31 ENCOUNTER — Other Ambulatory Visit: Payer: Self-pay

## 2019-01-31 VITALS — BP 110/68 | HR 73 | Wt 199.8 lb

## 2019-01-31 DIAGNOSIS — Z87891 Personal history of nicotine dependence: Secondary | ICD-10-CM | POA: Insufficient documentation

## 2019-01-31 DIAGNOSIS — N183 Chronic kidney disease, stage 3 unspecified: Secondary | ICD-10-CM | POA: Insufficient documentation

## 2019-01-31 DIAGNOSIS — I493 Ventricular premature depolarization: Secondary | ICD-10-CM | POA: Diagnosis not present

## 2019-01-31 DIAGNOSIS — I5032 Chronic diastolic (congestive) heart failure: Secondary | ICD-10-CM | POA: Insufficient documentation

## 2019-01-31 DIAGNOSIS — I2721 Secondary pulmonary arterial hypertension: Secondary | ICD-10-CM | POA: Diagnosis not present

## 2019-01-31 DIAGNOSIS — I272 Pulmonary hypertension, unspecified: Secondary | ICD-10-CM | POA: Diagnosis not present

## 2019-01-31 DIAGNOSIS — I4510 Unspecified right bundle-branch block: Secondary | ICD-10-CM | POA: Diagnosis not present

## 2019-01-31 DIAGNOSIS — I130 Hypertensive heart and chronic kidney disease with heart failure and stage 1 through stage 4 chronic kidney disease, or unspecified chronic kidney disease: Secondary | ICD-10-CM | POA: Insufficient documentation

## 2019-01-31 DIAGNOSIS — K219 Gastro-esophageal reflux disease without esophagitis: Secondary | ICD-10-CM | POA: Insufficient documentation

## 2019-01-31 DIAGNOSIS — Z8042 Family history of malignant neoplasm of prostate: Secondary | ICD-10-CM | POA: Insufficient documentation

## 2019-01-31 DIAGNOSIS — Z79899 Other long term (current) drug therapy: Secondary | ICD-10-CM | POA: Diagnosis not present

## 2019-01-31 DIAGNOSIS — Z823 Family history of stroke: Secondary | ICD-10-CM | POA: Insufficient documentation

## 2019-01-31 DIAGNOSIS — Z7984 Long term (current) use of oral hypoglycemic drugs: Secondary | ICD-10-CM | POA: Insufficient documentation

## 2019-01-31 DIAGNOSIS — G4733 Obstructive sleep apnea (adult) (pediatric): Secondary | ICD-10-CM | POA: Diagnosis not present

## 2019-01-31 DIAGNOSIS — I491 Atrial premature depolarization: Secondary | ICD-10-CM | POA: Insufficient documentation

## 2019-01-31 DIAGNOSIS — Z8249 Family history of ischemic heart disease and other diseases of the circulatory system: Secondary | ICD-10-CM | POA: Diagnosis not present

## 2019-01-31 DIAGNOSIS — Z950 Presence of cardiac pacemaker: Secondary | ICD-10-CM | POA: Insufficient documentation

## 2019-01-31 DIAGNOSIS — Z8572 Personal history of non-Hodgkin lymphomas: Secondary | ICD-10-CM | POA: Diagnosis not present

## 2019-01-31 DIAGNOSIS — I5022 Chronic systolic (congestive) heart failure: Secondary | ICD-10-CM

## 2019-01-31 LAB — BASIC METABOLIC PANEL
Anion gap: 11 (ref 5–15)
BUN: 30 mg/dL — ABNORMAL HIGH (ref 8–23)
CO2: 33 mmol/L — ABNORMAL HIGH (ref 22–32)
Calcium: 8.7 mg/dL — ABNORMAL LOW (ref 8.9–10.3)
Chloride: 93 mmol/L — ABNORMAL LOW (ref 98–111)
Creatinine, Ser: 1.42 mg/dL — ABNORMAL HIGH (ref 0.61–1.24)
GFR calc Af Amer: 52 mL/min — ABNORMAL LOW (ref >60)
GFR calc non Af Amer: 45 mL/min — ABNORMAL LOW (ref >60)
Glucose, Bld: 67 mg/dL — ABNORMAL LOW (ref 70–99)
Potassium: 4.1 mmol/L (ref 3.5–5.1)
Sodium: 137 mmol/L (ref 135–145)

## 2019-01-31 NOTE — Patient Instructions (Addendum)
Labs done today. We will contact you only if your labs are abnormal.  Dr. Aundra Dubin would like for you to start Adempas. Our clinic pharmacist will contact you about this medication.   No other medication changes were made today. Please continue all other medications as prescribed.   Your physician recommends that you schedule a follow-up appointment in: 6 weeks. Please remember to bring your walker so you can complete a 6 minute walk test at this appointment.  We will also contact Dr. Landis Gandy office on your behalf about your CPAP Machine.  At the [facility], you and your health needs are our priority. As part of our continuing mission to provide you with exceptional heart care, we have created designated Provider Care Teams. These Care Teams include your primary Cardiologist (physician) and Advanced Practice Providers (APPs- Physician Assistants and Nurse Practitioners) who all work together to provide you with the care you need, when you need it.   You may see any of the following providers on your designated Care Team at your next follow up: Marland Kitchen Dr Glori Bickers . Dr Loralie Champagne . Darrick Grinder, NP . Lyda Jester, PA . Audry Riles, PharmD   Please be sure to bring in all your medications bottles to every appointment.

## 2019-01-31 NOTE — Progress Notes (Signed)
PCP: Roetta Sessions, NP Cardiology: Dr. Aundra Dubin  85 y.o. with history of interstitial lung disease (?NSIP vs UIP) on home oxygen, pulmonary hypertension, diastolic CHF, and prior non-Hodgkins lymphoma (pelvic mass, s/p XRT) presents for followup of CHF and pulmonary hypertension.  Patient has history of reportedly mild ILD (NSIP vs UIP) followed by a pulmonologist at [facility] and pulmonary hypertension thought to be out of proportion lung disease followed by a physician at [facility].  Last CT chest was in 11/19, showing stable ILD (?UIP vs NSIP).  He had RHC in 2/20 with moderate PAH, PVR 5.1 WU.  He was tried on Adcirca but developed cognitive problems and worsening of peripheral edema so this was stopped.  He has been on home oxygen, uses at night and with exertion.   He was admitted in 6/20 with worsening dyspnea and peripheral edema, weight up despite compliance with torsemide 40 mg bid at home.  He has CKD stage 3 and torsemide was decreased in the spring. He was diuresed aggressively with fall in weight.  Echo showed normal LV size and systolic function, RV reportedly normal.  6/20 RHC showed normal filling pressures with moderate pulmonary hypertension, PVR 5.35 WU.   After discharge from hospital, patient started on ambrisentan.  His weight steadily began to increase and he developed worsening lower extremity edema, we stopped ambrisentan.   Patient was admitted in 8/20 with AKI after taking metolazone for several days.  He was admitted again in 8/20 with acute on chronic diastolic CHF. He was diuresed.  Wandering atrial pacemaker noted.   He seems to be doing better now that he has started [place].  He is up to 1600 mcg bid.  He uses a walker still.  Has dyspnea with moderate exertion.  Does fine walking short distances.  No lightheadedness.  No chest pain.  Weight has been stable. He still does not have CPAP. He has not been wearing his compression stockings as much.  Using Afrin  regularly.   Labs (6/20): K 4.7, creatinine 1.46 Labs (7/20): K 3.1, creatinine 1.67 Labs (8/20): K 3.9, creatinine 1.8 Labs (9/20): K 4.9, creatinine 1.76, BNP 78 Labs (10/20): K 4.2, creatininine 1.5 Labs (11/20): BNP 36, K 4.3, creatinine 1.4  ECG (personally reviewed): NSR with PACs, iRBBB  PMH: 1. Non-Hodgkin's lymphoma: Pelvic involvement.  S/p radiation.  Thought to be in remission (Dr. Benay Spice).  2. HTN 3. PACs/PVCs 4. GERD: Barrett's esophagus.  5. 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  - High resolution CT chest 11/19: ILD, ?UIP pattern.  6. Pulmonary hypertension: Thought to be out of proportion to underlying lung disease (ILD, ?NSIP vs UIP).   - RHC (2/20): mean RA 10, PASP 60 with mean 42, mean PCWP 11, CI 3, PVR 5.1 WU.  - Trial of Adcirca => failed due to ?memory worsening/cognitive affects and peripheral edema.  - Echo (6/20): EF 60-65%, mild LVH, RV reportedly normal, PASP 59 mmHg.  - RHC (6/20): mean RA 7, PA 50/24 mean 34, mean PCWP 12, CI 2.01, PAPI 3.7, PVR 5.35 WU - RF negative, ANA negative, anti-SCL70 negative, anti-centromere negative.  - V/Q scan (8/20): No evidence for chronic PE.  7. CKD: Stage 3.   8. Wandering atrial pacemaker 9. OSA  Social History   Socioeconomic History  . Marital status: Married    Spouse name: Not on file  . Number of children: 2  . Years of education: Not on file  . Highest education level: Not on file  Occupational History  . 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 Smoking status: Former Smoker    Packs/day: 1.00    Years: 35.00    Pack years: 35.00    Types: Pipe, Cigarettes    Quit date: 02/23/1992    Years since quitting: 26.9  . Smokeless tobacco: Never Used  . Tobacco comment: quit 20 years ago  Substance and Sexual Activity  . Alcohol use: Yes    Comment: 2 drinks daily scotch  AND WINE WITH SUPPER  . Drug use: No  . Sexual activity: Not on file  Other Topics Concern  .  Not on file  Social History Narrative   Married - second marriage   He has two children   Retired Horticulturist, commercial Professor   Currently teaches pottery making, 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[place]   Former Smoker quit 12 years ago- smoked for 35 years   Alcohol use- yes   Social Determinants of Radio broadcast assistant Strain:   . Difficulty of Paying Living Expenses: Not on file  Food Insecurity:   . Worried About Charity fundraiser in the Last Year: Not on file  . Ran Out of Food in the Last Year: Not on file  Transportation Needs:   . Lack of Transportation (Medical): Not on file  . Lack of Transportation (Non-Medical): Not on file  Physical Activity:   . Days of Exercise per Week: Not on file  . Minutes of Exercise per Session: Not on file  Stress:   . Feeling of Stress : Not on file  Social Connections:   . Frequency of Communication with Friends and Family: Not on file  . Frequency of Social Gatherings with Friends and Family: Not on file  . Attends Religious Services: Not on file  . Active Member of Clubs or Organizations: Not on file  . Attends Archivist Meetings: Not on file  . Marital Status: Not on file  Intimate Partner Violence:   . Fear of Current or Ex-Partner: Not on file  . Emotionally Abused: Not on file  . Physically Abused: Not on file  . Sexually Abused: Not on file   Family History  Problem Relation Age of Onset  . Heart disease Father        MI 29  . Urticaria Father   . Prostate cancer Brother   . Prostate cancer Paternal Uncle   . Prostate cancer Paternal Uncle   . Prostate cancer Paternal Uncle   . Hyperlipidemia Other   . Stroke Other   . Hypertension Other   . ADD / ADHD Other   . Colon cancer Neg Hx   . Allergic rhinitis Neg Hx   . Asthma Neg Hx   . Eczema Neg Hx    ROS: All systems reviewed and negative except as per HPI.   Current Outpatient Medications  Medication Sig Dispense Refill  . allopurinol (ZYLOPRIM) 100 MG  tablet Take 50 mg by mouth daily.    . diazepam (VALIUM) 5 MG tablet Take 5 mg by mouth 2 (two) times daily as needed.    . diltiazem (CARDIZEM CD) 360 MG 24 hr capsule Take 1 capsule (360 mg total) by mouth daily. 90 capsule 3  . escitalopram (LEXAPRO) 10 MG tablet Take 10 mg by mouth daily.    . finasteride (PROSCAR) 5 MG tablet Take 1 tablet (5 mg total) by mouth daily. (Patient taking differently: Take  5 mg by mouth at bedtime. ) 30 tablet 5  . FLONASE SENSIMIST 27.5 MCG/SPRAY nasal spray 1 spray daily.    Marland Kitchen glipiZIDE (GLUCOTROL) 5 MG tablet Take 0.5 tablets (2.5 mg total) by mouth daily before breakfast. 15 tablet 0  . latanoprost (XALATAN) 0.005 % ophthalmic solution INSTILL ONE DROP BY OPHTHALMIC ROUTE EVERY NIGHT AT BEDTIME INTO AFFECTED EYE    . metolazone (ZAROXOLYN) 2.5 MG tablet Every Tuesday and Friday 30 tablet 3  . omeprazole (PRILOSEC) 20 MG capsule Take 20 mg by mouth daily.    . Potassium Chloride ER 20 MEQ TBCR Take 10 mEq by mouth daily. 90 tablet 3  . rOPINIRole (REQUIP) 0.5 MG tablet Take 0.5 mg by mouth at bedtime.    . Selexipag 1600 MCG TABS Take 1,600 mcg by mouth 2 (two) times daily.    Marland Kitchen spironolactone (ALDACTONE) 25 MG tablet Take 1 tablet (25 mg total) by mouth daily. 30 tablet 0  . torsemide (DEMADEX) 100 MG tablet Take 1 tablet (100 mg total) by mouth 2 (two) times daily. 270 tablet 6  . traMADol (ULTRAM) 50 MG tablet Take 50 mg by mouth every 12 (twelve) hours as needed for moderate pain.      No current facility-administered medications for this encounter.   BP 110/68   Pulse 73   Wt 90.6 kg (199 lb 12.8 oz)   SpO2 97%   PF (!) 2 L/min   BMI 31.29 kg/m  General: NAD Neck: No JVD, no thyromegaly or thyroid nodule.  Lungs: Slight crackles at bases bilaterally CV: Nondisplaced PMI.  Heart regular S1/S2, no S3/S4, 2/6 early SEM RUSB.  1+ edema to knees bilaterally.  No carotid bruit.  Normal pedal pulses.  Abdomen: Soft, nontender, no hepatosplenomegaly, no  distention.  Skin: Intact without lesions or rashes.  Neurologic: Alert and oriented x 3.  Psych: Normal affect. Extremities: No clubbing or cyanosis.  HEENT: Normal.   Assessment/Plan: 1. Chronic diastolic CHF: Suspect with significant component of RV failure from pulmonary hypertension. Echo in 6/20 with EF 60-65%, RV not well-visualized but PASP 59 mmHg.  Currently doing better.  Weight stable, no JVD.  NYHA class II-III.    - Continue torsemide 100 mg bid and metolazone 2.5 twice a week.  BMET today.  - Continue spironolactone 25 mg daily.  - Wear compression stockings more regularly.  2. CKD Stage 3:  BMET today as above.  3. Pulmonary hypertnesion: Patient thought to have [place] out of proportion to his lung disease (ILD, NSIP versus UIP). RHC in 2/20 showed mean PA pressure 41, PVR 5.1 WU, CI 3. Echo in 6/20 with RV reportedly ok =>I reviewed and do not think that RV was well-visualized. Patient did not tolerate Adcirca due to edema/?cognitive effects.  RHC was done again in 6/20, showed moderate pulmonary arterial hypertension with PVR 5.35 (comparable to prior).  Serological workup for PH was negative. Patient started ambrisentan 5 mg and developed significant edema/volume overload despite a high dose of torsemide.  I am concerned that ambrisentan may have been part of the reason for the peripheral edema/volume overload.  He is now on Uptravi and feels like he is doing better overall.   - he will stay off ambrisentan (?trigger for volume overload).  - As above, he did not tolerate tadalafil.  - Continue Uptravi, now up to 1600 mcg bid.  - Given improvement on Uptravi, will start him on riociguat. - Unable to do 6 minute walk today as  he did not bring his walker, will bring his walker next appt.   4. Wandering atrial pacemaker: He is on diltiazem CD 360 mg daily. NSR on ECG today.  5. OSA: Waiting on CPAP.   Would avoid Afrin use with history of arrhythmias (MAT/wandering atrial  pacemaker).   Followup in 6 wks.   Loralie Champagne 01/31/2019

## 2019-01-31 NOTE — Telephone Encounter (Signed)
Patient Advocate Encounter   Received notification from [facility] that prior authorization for Adempas is required.   PA submitted on CoverMyMeds Key [id number] Status is pending   Will continue to follow.  Audry Riles, PharmD, BCPS, BCCP, CPP Heart Failure Clinic Pharmacist [phone]

## 2019-02-01 NOTE — Telephone Encounter (Signed)
Advanced Heart Failure Patient Advocate Encounter  Prior Authorization for Adempas has been approved.   Effective dates: 02/01/18 through 02/01/20  Audry Riles, PharmD, BCPS, BCCP, CPP Heart Failure Clinic Pharmacist [phone]

## 2019-02-08 ENCOUNTER — Telehealth (HOSPITAL_COMMUNITY): Payer: Self-pay | Admitting: Licensed Clinical Social Worker

## 2019-02-08 NOTE — Telephone Encounter (Signed)
Adempas Patient Support enrollment form completed and faxed in for review- fax confirmation received  Ryan [name], [facility] Desk#: [phone] Cell#: [phone]

## 2019-02-12 ENCOUNTER — Telehealth (HOSPITAL_COMMUNITY): Payer: Self-pay | Admitting: Pharmacist

## 2019-02-12 NOTE — Telephone Encounter (Addendum)
Obtained [employer] for copay assistance with Malvin Johns and Adempas.  ID: [id number] BIN: [id number] PCN: [id number] Group: [id number] Start Date 01/13/2019 End Date 01/12/2020 Aquilla Solian Amount $10,000.00  Provided pharmacy card information to [facility].   Audry Riles, PharmD, BCPS, BCCP, CPP Heart Failure Clinic Pharmacist [phone]

## 2019-02-28 ENCOUNTER — Telehealth (HOSPITAL_COMMUNITY): Payer: Self-pay | Admitting: Cardiology

## 2019-02-28 NOTE — Telephone Encounter (Signed)
Ronalee Belts with [facility] called to report, patient successfully started adempas today @ 0.5 mg TID. Vitals stable, asymptomatic, questions addressed at visit. Speciality nurse will return for titration and keep office aware of medication dosages/side effects etc.   [phone]

## 2019-03-06 ENCOUNTER — Ambulatory Visit: Payer: Medicare HMO | Admitting: Neurology

## 2019-03-09 ENCOUNTER — Other Ambulatory Visit: Payer: Self-pay

## 2019-03-09 ENCOUNTER — Other Ambulatory Visit: Payer: Medicare HMO | Admitting: Hospice

## 2019-03-09 DIAGNOSIS — Z515 Encounter for palliative care: Secondary | ICD-10-CM

## 2019-03-09 DIAGNOSIS — I5032 Chronic diastolic (congestive) heart failure: Secondary | ICD-10-CM

## 2019-03-09 NOTE — Progress Notes (Addendum)
[facility] Consult Note Telephone: [phone]  Fax: [phone]  PATIENT NAME: Ryan [name] DOB: 31-Mar-1931 MRN: [id number]  PRIMARY CARE PROVIDER:   Roetta Sessions, NP  REFERRING PROVIDER:  Roetta Sessions, NP [address] [place],  [place] [address]  RESPONSIBLE PARTY:   Travares Nelles, wife [phone]   RECOMMENDATIONS/PLAN:   Advance Care Planning/Goals of Care: Visit consisted of building trust an further  discussions on Palliative Medicine as specialized medical care for people living with serious illness, aimed at facilitating better quality of life through symptoms relief, assisting with advance care plan and establishing goals of care. Patient is a DNR. Goals of care include to maximize quality of life and symptom management. 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 2. Symptom management:Bilateral lower edema improving per patient. Patient continues metalozone and spironolactone as ordered. Education on resting in between activities reiterated. Patient affirmed he has caregivers through facility's [facility], and also medication management. He said he stopped using his CPAP because it had the wrong setting and has not received another one. He is on oxygen 2L/Min, with no respiratory distress, no recent COPD flare. He has appointment with his Pulmonologist next week.   3. Follow up Palliative Care Visit: Palliative care will continue to follow for goals of care clarification and symptom management.  I spent15mnutes providing this consultation.More than 50% of the time in this consultation was spent on coordinating care communication. HISTORY OF PRESENT ILLNESS:Bradan B Gutsellis a 86y.o.year oldmalewith multiple medical problems including CHF, COPD, pulmonary fibrosis,  HTN, non hodgkin's lymphoma status post radiation. Palliative Care was asked to help address goals of care.   CODE STATUS: DNR  PPS: 50% HOSPICE ELIGIBILITY/DIAGNOSIS: TBD  PAST MEDICAL HISTORY:  Past Medical History:  Diagnosis Date  . Asthma   . Barrett's esophagus   . Benign prostatic hypertrophy   . Bilateral lower extremity edema   . CHF (congestive heart failure) ([facility]   . Chronic fatigue   . COPD (chronic obstructive pulmonary disease) ([facility]   . Diverticulosis of colon (without mention of hemorrhage)   . Esophageal reflux   . Gastritis   . Gluten intolerance   . Hiatal hernia 02/10/11   Noted on CT Scan - Moderate Hiatal Hernia  . Hyperlipemia   . Hypertension   . Lymphoma of lymph nodes in pelvis ([facility] 03/03/2011    Large Right Retroperitoneal Mass  . Melanoma ([facility]    lymphoma  . Microcytic anemia 04/20/2011   . 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 Osteoarthritis    hands/feet,knees, NECK, BACK  . Periaortic lymphadenopathy 02/16/2011  . Personal history of colonic polyps 2004   hyperplastic Dr. MCollene Mares . Renal cyst 02/10/11    Noted on CT Scan - Bilateral Renal Cysts  . S/P radiation therapy 03/15/11 - 04/09/11   Abdominal/ Pelvic Tumor, 3600 cGy/20 Fractions    SOCIAL HX:  Social History   Tobacco Use  . Smoking status: Former Smoker    Packs/day: 1.00    Years: 35.00    Pack years: 35.00    Types: Pipe, Cigarettes    Quit date: 02/23/1992    Years since quitting: 27.0  . Smokeless tobacco: Never Used  . Tobacco comment: quit 20 years ago  Substance Use Topics  . Alcohol use: Yes    Comment: 2 drinks daily scotch  AND WINE WITH SUPPER    ALLERGIES:  Allergies  Allergen Reactions  . Pollen Extract-Tree Extract Other (See Comments)    HEADACHES, TIRED , DRAINAGE FROM SINUSES  . Molds & Smuts Other (See Comments)    Also dust mites causes sinus infections, h/a etc.     PERTINENT MEDICATIONS:    Outpatient Encounter Medications as of 03/09/2019  Medication Sig  . allopurinol (ZYLOPRIM) 100 MG tablet Take 50 mg by mouth daily.  . diazepam (VALIUM) 5 MG tablet Take 5 mg by mouth 2 (two) times daily as needed.  . diltiazem (CARDIZEM CD) 360 MG 24 hr capsule Take 1 capsule (360 mg total) by mouth daily.  Marland Kitchen escitalopram (LEXAPRO) 10 MG tablet Take 10 mg by mouth daily.  . finasteride (PROSCAR) 5 MG tablet Take 1 tablet (5 mg total) by mouth daily. (Patient taking differently: Take 5 mg by mouth at bedtime. )  . FLONASE SENSIMIST 27.5 MCG/SPRAY nasal spray 1 spray daily.  Marland Kitchen glipiZIDE (GLUCOTROL) 5 MG tablet Take 0.5 tablets (2.5 mg total) by mouth daily before breakfast.  . latanoprost (XALATAN) 0.005 % ophthalmic solution INSTILL ONE DROP BY OPHTHALMIC ROUTE EVERY NIGHT AT BEDTIME INTO AFFECTED EYE  . metolazone (ZAROXOLYN) 2.5 MG tablet Every Tuesday and Friday  . omeprazole (PRILOSEC) 20 MG capsule Take 20 mg by mouth daily.  . Potassium Chloride ER 20 MEQ TBCR Take 10 mEq by mouth daily.  Marland Kitchen rOPINIRole (REQUIP) 0.5 MG tablet Take 0.5 mg by mouth at bedtime.  . Selexipag 1600 MCG TABS Take 1,600 mcg by mouth 2 (two) times daily.  Marland Kitchen spironolactone (ALDACTONE) 25 MG tablet Take 1 tablet (25 mg total) by mouth daily.  Marland Kitchen torsemide (DEMADEX) 100 MG tablet Take 1 tablet (100 mg total) by mouth 2 (two) times daily.  . traMADol (ULTRAM) 50 MG tablet Take 50 mg by mouth every 12 (twelve) hours as needed for moderate pain.    No facility-administered encounter medications on file as of 03/09/2019.    PHYSICAL EXAM/ROS:   General: NAD, cooperative Cardiovascular: regular rate and rhythm Pulmonary: clear ant fields, normal expiratory effort on oxygen 2L/Min Abdomen: soft, nontender, + bowel sounds GU: no suprapubic tenderness Extremities: chronic 2+ edema, no joint deformities Skin: no rashes on exposed skin Neurological: Weakness but otherwise nonfocal  Teodoro Spray, NP

## 2019-03-14 ENCOUNTER — Telehealth (HOSPITAL_COMMUNITY): Payer: Self-pay

## 2019-03-14 ENCOUNTER — Telehealth (HOSPITAL_COMMUNITY): Payer: Self-pay | Admitting: Cardiology

## 2019-03-14 MED ORDER — ADEMPAS 1 MG PO TABS: 1.0000 mg | ORAL_TABLET | Freq: Three times a day (TID) | ORAL | Status: DC

## 2019-03-14 NOTE — Telephone Encounter (Signed)
COVID-19 pre-appointment screening questions:   Do you have a history of COVID-19 or a positive test result in the past 7-10 days? NO  To the best of your knowledge, have you been in close contact with anyone with a confirmed diagnosis of COVID 19?NO  Have you had any one or more of the following: Fever, chills, cough, shortness of breath (out of the normal for you) or any flu-like symptoms?NO   Are you experiencing any of the following symptoms that is new or out of usual for you:NO   . Ear, nose or throat discomfort . Sore throat . Headache . Muscle Pain . Diarrhea . Loss of taste or smell   . Reviewed all the following with patient: REVIEWED . Use of hand sanitizer when entering the building . Everyone is required to wear a mask in the building, if you do not have a mask we are happy to provide you with one when you arrive . NO Visitor guidelines   If patient answers YES to any of questions they must change to a virtual visit and place note in comments about symptoms

## 2019-03-14 NOTE — Telephone Encounter (Signed)
[name] with [facility] called to report, patient is tolerating adempas well. Vitals stable. Patient only c/o increased leg pain however he has a history of restless leg syndrome. Patient reports his breathing is improving. Per protocol will titrate to the next dose of 29m

## 2019-03-15 ENCOUNTER — Ambulatory Visit (HOSPITAL_COMMUNITY)
Admission: RE | Admit: 2019-03-15 | Discharge: 2019-03-15 | Disposition: A | Discharge status: Home / Self Care | Payer: Medicare HMO | Source: Ambulatory Visit | Attending: Cardiology | Admitting: Cardiology

## 2019-03-15 ENCOUNTER — Other Ambulatory Visit: Payer: Self-pay

## 2019-03-15 ENCOUNTER — Encounter (HOSPITAL_COMMUNITY): Payer: Self-pay | Admitting: Cardiology

## 2019-03-15 VITALS — BP 102/51 | HR 83 | Wt 194.0 lb

## 2019-03-15 DIAGNOSIS — I4510 Unspecified right bundle-branch block: Secondary | ICD-10-CM | POA: Insufficient documentation

## 2019-03-15 DIAGNOSIS — I5032 Chronic diastolic (congestive) heart failure: Secondary | ICD-10-CM

## 2019-03-15 LAB — BASIC METABOLIC PANEL
Anion gap: 10 (ref 5–15)
BUN: 34 mg/dL — ABNORMAL HIGH (ref 8–23)
CO2: 31 mmol/L (ref 22–32)
Calcium: 8.4 mg/dL — ABNORMAL LOW (ref 8.9–10.3)
Chloride: 96 mmol/L — ABNORMAL LOW (ref 98–111)
Creatinine, Ser: 1.57 mg/dL — ABNORMAL HIGH (ref 0.61–1.24)
GFR calc Af Amer: 46 mL/min — ABNORMAL LOW (ref >60)
GFR calc non Af Amer: 40 mL/min — ABNORMAL LOW (ref >60)
Glucose, Bld: 58 mg/dL — ABNORMAL LOW (ref 70–99)
Potassium: 4.2 mmol/L (ref 3.5–5.1)
Sodium: 137 mmol/L (ref 135–145)

## 2019-03-15 LAB — CBC
HCT: 44.2 % (ref 39.0–52.0)
Hemoglobin: 13.5 g/dL (ref 13.0–17.0)
MCH: 28.2 pg (ref 26.0–34.0)
MCHC: 30.5 g/dL (ref 30.0–36.0)
MCV: 92.3 fL (ref 80.0–100.0)
Platelets: 305 10*3/uL (ref 150–400)
RBC: 4.79 MIL/uL (ref 4.22–5.81)
RDW: 14.8 % (ref 11.5–15.5)
WBC: 10.6 10*3/uL — ABNORMAL HIGH (ref 4.0–10.5)
nRBC: 0 % (ref 0.0–0.2)

## 2019-03-15 LAB — BRAIN NATRIURETIC PEPTIDE: B Natriuretic Peptide: 58.8 pg/mL (ref 0.0–100.0)

## 2019-03-15 NOTE — Patient Instructions (Addendum)
No changes today!   Labs today We will only contact you if something comes back abnormal or we need to make some changes. Otherwise no news is good news!    Your physician recommends that you schedule a follow-up appointment in: 2 month with Dr Aundra Dubin on Wednesday, April 14th, 2021 at 11:40 am.  Ryan [name]   Please call office at [phone] option 2 if you have any questions or concerns.    At the [facility], you and your health needs are our priority. As part of our continuing mission to provide you with exceptional heart care, we have created designated Provider Care Teams. These Care Teams include your primary Cardiologist (physician) and Advanced Practice Providers (APPs- Physician Assistants and Nurse Practitioners) who all work together to provide you with the care you need, when you need it.   You may see any of the following providers on your designated Care Team at your next follow up: Marland Kitchen Dr Glori Bickers . Dr Loralie Champagne . Darrick Grinder, NP . Lyda Jester, PA . Audry Riles, PharmD   Please be sure to bring in all your medications bottles to every appointment.

## 2019-03-20 ENCOUNTER — Other Ambulatory Visit: Payer: Self-pay

## 2019-03-20 ENCOUNTER — Telehealth: Payer: Self-pay

## 2019-03-20 ENCOUNTER — Telehealth: Payer: Self-pay | Admitting: Neurology

## 2019-03-20 ENCOUNTER — Encounter: Payer: Self-pay | Admitting: Neurology

## 2019-03-20 ENCOUNTER — Ambulatory Visit (INDEPENDENT_AMBULATORY_CARE_PROVIDER_SITE_OTHER): Payer: Medicare HMO | Admitting: Neurology

## 2019-03-20 VITALS — BP 111/73 | HR 79 | Temp 97.6°F | Ht 67.0 in

## 2019-03-20 DIAGNOSIS — G8929 Other chronic pain: Secondary | ICD-10-CM

## 2019-03-20 DIAGNOSIS — G2581 Restless legs syndrome: Secondary | ICD-10-CM

## 2019-03-20 DIAGNOSIS — M5441 Lumbago with sciatica, right side: Secondary | ICD-10-CM | POA: Diagnosis not present

## 2019-03-20 DIAGNOSIS — M5442 Lumbago with sciatica, left side: Secondary | ICD-10-CM

## 2019-03-20 MED ORDER — ROPINIROLE HCL 2 MG PO TABS: 2.0000 mg | ORAL_TABLET | Freq: Three times a day (TID) | ORAL | 3 refills | Status: DC

## 2019-03-20 NOTE — Patient Instructions (Signed)
We will increase the requip to 2 mg three times a day.

## 2019-03-20 NOTE — Telephone Encounter (Signed)
[employer] medicare order sent to GI. They will obtain the auth and reach out to the patient to schedule.  °

## 2019-03-20 NOTE — Progress Notes (Signed)
Reason for visit: Restless leg syndrome  Referring physician: Dr. Feliberto Harts is a 86 y.o. male  History of present illness:  Ryan [name] is an 86 year old right-handed white male with a history of diabetes, generalized anxiety disorder, a history of non-Hodgkin's lymphoma, and a history of COPD and pulmonary hypertension.  The patient is on oxygen 24/7.  The patient claims that he has a lifelong history of restless leg syndrome.  He is not sure but he thinks his father may have had similar problems.  With the diabetes he has developed numbness in the feet, he also has significant problems with swelling in the feet.  The patient claims that over the last several months the symptoms have gotten much worse for him.  He had been on Mirapex previously but was recently switched to Requip.  The patient is not sure what the dose is but his referring notes indicate that he is on 4 mg at night.  The patient believes that he may be taking the medication twice daily.  The patient reports that he has cramps in the shoulders and in the low back.  His low back pain is severe enough that this is a limiting factor with his ability to walk.  He may use a walker at times.  The patient denies any cramps in the legs or feet.  The patient reports that he may have hot sensations that go all the way up the legs to the groin area, he may have tingling up the legs and he also has tingling around the perirectal area that has developed over the last several months.  The patient is having problems with diarrhea at times, he has difficulty with urinary frequency, he is getting up every hour at night to urinate.  He claims that the symptoms in the legs do not bother him at nighttime, he is able to get to sleep quite well.  He claims that his symptoms are only during the daytime.  He claims that at times he feels twitchy or jumpy with the legs.  If he gets up and walks, this does not alleviate his symptoms.  He  sometimes will go when take a hot shower which seems to help the symptoms in the legs.  He does feel slightly weak in the legs, he has not had any recent falls.  He is sent to this office for an evaluation.  He does have some problems with swelling in the feet and ankles.  Past Medical History:  Diagnosis Date  . Asthma   . Barrett's esophagus   . Benign prostatic hypertrophy   . Bilateral lower extremity edema   . CHF (congestive heart failure) ([place])   . Chronic fatigue   . COPD (chronic obstructive pulmonary disease) ([place])   . Diverticulosis of colon (without mention of hemorrhage)   . Esophageal reflux   . Gastritis   . Gluten intolerance   . Hiatal hernia 02/10/11   Noted on CT Scan - Moderate Hiatal Hernia  . Hyperlipemia   . Hypertension   . Lymphoma of lymph nodes in pelvis ([place]) 03/03/2011    Large Right Retroperitoneal Mass  . Melanoma ([place])    lymphoma  . Microcytic anemia 04/20/2011   . 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 Osteoarthritis    hands/feet,knees, NECK, BACK  . Periaortic lymphadenopathy 02/16/2011  . Personal history of colonic polyps 2004   hyperplastic Dr. Collene Mares  .  Pulmonary hyperinflation   . Renal cyst 02/10/11    Noted on CT Scan - Bilateral Renal Cysts  . S/P radiation therapy 03/15/11 - 04/09/11   Abdominal/ Pelvic Tumor, 3600 cGy/20 Fractions    Past Surgical History:  Procedure Laterality Date  . ARTHROSCOPIC REPAIR ACL     right  . BONE MARROW ASPIRATION  02/25/11   Bone Marrow, Aspirate, Clot, and Bilateral Bx, Right PIC  . CATARACT EXTRACTION, BILATERAL    . EYE SURGERY    . HERNIA REPAIR     LEFT INGUINAL   . INSERTION OF MESH N/A 08/23/2012   Procedure: INSERTION OF MESH;  Surgeon: Madilyn Hook, DO;  Location: [facility];  Service: General;  Laterality: N/A;  . LUMBAR LAMINECTOMY/DECOMPRESSION MICRODISCECTOMY Right 12/31/2013   Procedure: RIGHT L4-5 L5-S1 LAMINECTOMY;  Surgeon: Kristeen Miss, MD;  Location: [facility];  Service: Neurosurgery;  Laterality: Right;  RIGHT L4-5 L5-S1 LAMINECTOMY  . RIGHT HEART CATH N/A 07/25/2018   Procedure: RIGHT HEART CATH;  Surgeon: Larey Dresser, MD;  Location: [facility];  Service: Cardiovascular;  Laterality: N/A;  . ROTATOR CUFF REPAIR     right  . TONSILLECTOMY    . TONSILLECTOMY    . VENTRAL HERNIA REPAIR N/A 08/23/2012   Procedure: HERNIA REPAIR VENTRAL ADULT;  Surgeon: Madilyn Hook, DO;  Location: [facility];  Service: General;  Laterality: N/A;    Family History  Problem Relation Age of Onset  . Heart disease Father        MI 72  . Urticaria Father   . Prostate cancer Brother   . Prostate cancer Paternal Uncle   . Prostate cancer Paternal Uncle   . Prostate cancer Paternal Uncle   . Hyperlipidemia Other   . Stroke Other   . Hypertension Other   . ADD / ADHD Other   . Colon cancer Neg Hx   . Allergic rhinitis Neg Hx   . Asthma Neg Hx   . Eczema Neg Hx     Social history:  reports that he quit smoking about 27 years ago. His smoking use included pipe and cigarettes. He has a 35.00 pack-year smoking history. He has never used smokeless tobacco. He reports current alcohol use. He reports that he does not use drugs.  Medications:  Prior to Admission medications   Medication Sig Start Date End Date Taking? Authorizing Provider  allopurinol (ZYLOPRIM) 100 MG tablet Take 50 mg by mouth daily. 12/13/17  Yes [provider]  diazepam (VALIUM) 5 MG tablet Take 5 mg by mouth 2 (two) times daily as needed. 10/30/18  Yes [provider]  diltiazem (CARDIZEM CD) 360 MG 24 hr capsule Take 1 capsule (360 mg total) by mouth daily. 10/04/18  Yes Larey Dresser, MD  escitalopram (LEXAPRO) 10 MG tablet Take 10 mg by mouth daily.   Yes [provider]  finasteride (PROSCAR) 5 MG tablet Take 1 tablet (5 mg total) by mouth daily. Patient taking differently: Take 5 mg by mouth at bedtime.  12/25/12  Yes  Norins, Heinz Knuckles, MD  FLONASE SENSIMIST 27.5 MCG/SPRAY nasal spray 1 spray daily as needed.  12/11/18  Yes [provider]  glipiZIDE (GLUCOTROL) 5 MG tablet Take 0.5 tablets (2.5 mg total) by mouth daily before breakfast. 09/27/18 03/20/19 Yes Amin, Ankit Chirag, MD  ipratropium (ATROVENT) 0.03 % nasal spray Place 2 sprays into both nostrils 2 (two) times daily. 02/05/19  Yes [provider]  latanoprost (XALATAN) 0.005 %  ophthalmic solution INSTILL ONE DROP BY OPHTHALMIC ROUTE EVERY NIGHT AT BEDTIME INTO AFFECTED EYE 01/20/18  Yes [provider]  methocarbamol (ROBAXIN) 500 MG tablet Take 500 mg by mouth 3 (three) times daily.   Yes [provider]  metolazone (ZAROXOLYN) 2.5 MG tablet Every Tuesday and Friday 10/17/18  Yes Clegg, Amy D, NP  omeprazole (PRILOSEC) 20 MG capsule Take 20 mg by mouth daily.   Yes [provider]  Potassium Chloride ER 20 MEQ TBCR Take 10 mEq by mouth daily. 10/04/18  Yes Larey Dresser, MD  Riociguat (ADEMPAS) 1 MG TABS Take 1 mg by mouth 3 (three) times daily. 03/14/19  Yes Larey Dresser, MD  rOPINIRole (REQUIP) 0.5 MG tablet Take 0.5 mg by mouth at bedtime. 12/11/18  Yes [provider]  Selexipag 1600 MCG TABS Take 1,600 mcg by mouth 2 (two) times daily.   Yes [provider]  spironolactone (ALDACTONE) 25 MG tablet Take 1 tablet (25 mg total) by mouth daily. 09/27/18 03/20/19 Yes Amin, Jeanella Flattery, MD  torsemide (DEMADEX) 100 MG tablet Take 1 tablet (100 mg total) by mouth 2 (two) times daily. 10/17/18 03/20/19 Yes Clegg, Amy D, NP  traMADol (ULTRAM) 50 MG tablet Take 50 mg by mouth every 12 (twelve) hours as needed for moderate pain.    Yes [provider]  traZODone (DESYREL) 100 MG tablet Take 100 mg by mouth at bedtime. 02/12/19  Yes [provider]      Allergies  Allergen Reactions  . Pollen Extract-Tree Extract Other (See Comments)    HEADACHES, TIRED , DRAINAGE FROM SINUSES  .  Molds & Smuts Other (See Comments)    Also dust mites causes sinus infections, h/a etc.    ROS:  Out of a complete 14 system review of symptoms, the patient complains only of the following symptoms, and all other reviewed systems are negative.  Swelling in the legs Hearing loss, ringing in the ears Blurred vision, loss of vision Urination problems, impotence Easy bruising Muscle cramps, aching muscles Memory loss, confusion Decreased energy Restless legs  Blood pressure 111/73, pulse 79, temperature 97.6 F (36.4 C), height _0  (1.702 m).  Physical Exam  General: The patient is alert and cooperative at the time of the examination.  The patient is moderately obese.  Eyes: Pupils are equal, round, and reactive to light. Discs are flat bilaterally.  Neck: The neck is supple, no carotid bruits are noted.  Respiratory: The respiratory examination is clear.  Cardiovascular: The cardiovascular examination reveals a regular rate and rhythm, no obvious murmurs or rubs are noted.  Skin: Extremities are with 2+ edema below the knees is seen bilaterally.  Neurologic Exam  Mental status: The patient is alert and oriented x 3 at the time of the examination. The patient has apparent normal recent and remote memory, with an apparently normal attention span and concentration ability.  Cranial nerves: Facial symmetry is present. There is good sensation of the face to pinprick and soft touch bilaterally. The strength of the facial muscles and the muscles to head turning and shoulder shrug are normal bilaterally. Speech is well enunciated, no aphasia or dysarthria is noted. Extraocular movements are full. Visual fields are full. The tongue is midline, and the patient has symmetric elevation of the soft palate. No obvious hearing deficits are noted.  Motor: The motor testing reveals 5 over 5 strength of all 4 extremities. Good symmetric motor tone is noted throughout.  Sensory: Sensory  testing  is intact to pinprick, soft touch, vibration sensation, and position sense on the upper extremities.  With the lower extremities there appears to be a stocking pattern pinprick sensory deficit to the knees bilaterally with significant impairment of position sense in the right foot more so than the left.  Vibration sensation is relatively symmetric in 1 foot to the next.  No evidence of extinction is noted.  Coordination: Cerebellar testing reveals good finger-nose-finger and heel-to-shin bilaterally.  Gait and station: Gait is slightly wide-based, the patient is able to ambulate independently.  Romberg is negative.  No drift is seen.  Reflexes: Deep tendon reflexes are symmetric, but are depressed bilaterally. Toes are downgoing bilaterally.   Assessment/Plan:  1.  History of restless leg syndrome  2.  Chronic back pain  3.  Perirectal paresthesias, bilateral leg sensory alteration  The patient has a history that is somewhat unusual for restless leg syndrome alone.  He is having sensory alterations of both legs, he has having tingling sensations in the perirectal region, a spinal disorder needs to be excluded.  The patient will undergo MRI of the lumbar spine.  The patient claims that he has symptoms only during the daytime, not at night and that getting up and walking and moving does not alleviate the symptoms in the legs.  These features are unusual for restless leg syndrome.  The patient likely does have a diabetic peripheral neuropathy.  He will be switched to Requip 2 mg 3 times during the day, he will not take the medication only at bedtime.  He will follow-up here in about 3 months.  Jill Alexanders MD 03/20/2019 10:40 AM  [facility] [address] [place], [place] [address]  Phone [phone] Fax [phone]

## 2019-03-20 NOTE — Telephone Encounter (Signed)
Prescription fax to [facility] at [phone] twice and confirmed.

## 2019-04-02 NOTE — Telephone Encounter (Signed)
[employer] Josem Kaufmann: [id number] (exp. 04/01/19 to 09/28/19) patient is scheduled at GI for 04/16/19.

## 2019-04-04 ENCOUNTER — Telehealth (HOSPITAL_COMMUNITY): Payer: Self-pay | Admitting: Cardiology

## 2019-04-04 NOTE — Telephone Encounter (Signed)
He could take 1 dose of metolazone 2.5 with am torsemide, will need an extra 40 mEq KCl with it.  BMET Monday.

## 2019-04-04 NOTE — Telephone Encounter (Signed)
Patients husband called with concerns over increased BLE edema- patient is having difficulty ambulating with swelling.please note patient also has a ?stomach virus?-upset stomach and chills.  Ok to increase diuretic?  Attempted to return call to further assess. No answer [facility] [phone]

## 2019-04-05 NOTE — Telephone Encounter (Signed)
Pt aware via wife Labs 3/8 _0 

## 2019-04-09 ENCOUNTER — Other Ambulatory Visit (HOSPITAL_COMMUNITY): Payer: Self-pay

## 2019-04-09 ENCOUNTER — Ambulatory Visit (HOSPITAL_COMMUNITY)
Admission: RE | Admit: 2019-04-09 | Discharge: 2019-04-09 | Disposition: A | Discharge status: Home / Self Care | Payer: Medicare HMO | Source: Ambulatory Visit | Attending: Cardiology | Admitting: Cardiology

## 2019-04-09 ENCOUNTER — Other Ambulatory Visit: Payer: Self-pay

## 2019-04-09 DIAGNOSIS — I5032 Chronic diastolic (congestive) heart failure: Secondary | ICD-10-CM

## 2019-04-09 LAB — BASIC METABOLIC PANEL
Anion gap: 12 (ref 5–15)
BUN: 67 mg/dL — ABNORMAL HIGH (ref 8–23)
CO2: 31 mmol/L (ref 22–32)
Calcium: 8.2 mg/dL — ABNORMAL LOW (ref 8.9–10.3)
Chloride: 91 mmol/L — ABNORMAL LOW (ref 98–111)
Creatinine, Ser: 2.6 mg/dL — ABNORMAL HIGH (ref 0.61–1.24)
GFR calc Af Amer: 25 mL/min — ABNORMAL LOW (ref >60)
GFR calc non Af Amer: 22 mL/min — ABNORMAL LOW (ref >60)
Glucose, Bld: 104 mg/dL — ABNORMAL HIGH (ref 70–99)
Potassium: 4.3 mmol/L (ref 3.5–5.1)
Sodium: 134 mmol/L — ABNORMAL LOW (ref 135–145)

## 2019-04-10 ENCOUNTER — Telehealth (HOSPITAL_COMMUNITY): Payer: Self-pay

## 2019-04-10 DIAGNOSIS — I5022 Chronic systolic (congestive) heart failure: Secondary | ICD-10-CM

## 2019-04-10 MED ORDER — TORSEMIDE 20 MG PO TABS: 80.0000 mg | ORAL_TABLET | Freq: Two times a day (BID) | ORAL | 6 refills | Status: DC

## 2019-04-10 MED ORDER — METOLAZONE 2.5 MG PO TABS: 2.5000 mg | ORAL_TABLET | ORAL | 3 refills | Status: DC

## 2019-04-10 NOTE — Telephone Encounter (Signed)
-----  Message from Larey Dresser, MD sent at 04/09/2019 10:23 PM EST ----- Creatinine is up.  Hold torsemide for a day.  Decrease it to 80 mg bid.  Hold metolazone for a week then decrease it to once weekly from twice weekly. BMET 1 week.

## 2019-04-10 NOTE — Telephone Encounter (Signed)
As patient lives in assisted living facility, spoke with Nurse Denton Ar in charge of medication distribution. Advised to hold torsemide for 1 day and resume at 66m BID.  Pt to also hold metolazone for 1 week and resume at once weekly on Wednesdays (pt currently take twice a week on Wednesday and saturdays per nurse).  Appt scheduled to return to clinic in 1 week. Verbalized understanding.

## 2019-04-16 ENCOUNTER — Other Ambulatory Visit: Payer: Self-pay

## 2019-04-16 ENCOUNTER — Ambulatory Visit
Admission: RE | Admit: 2019-04-16 | Discharge: 2019-04-16 | Disposition: A | Discharge status: Home / Self Care | Payer: Medicare HMO | Source: Ambulatory Visit | Attending: Neurology | Admitting: Neurology

## 2019-04-16 DIAGNOSIS — M5441 Lumbago with sciatica, right side: Secondary | ICD-10-CM

## 2019-04-16 DIAGNOSIS — G8929 Other chronic pain: Secondary | ICD-10-CM

## 2019-04-16 DIAGNOSIS — M5442 Lumbago with sciatica, left side: Secondary | ICD-10-CM | POA: Diagnosis not present

## 2019-04-17 ENCOUNTER — Telehealth: Payer: Self-pay | Admitting: Neurology

## 2019-04-17 ENCOUNTER — Ambulatory Visit (HOSPITAL_COMMUNITY)
Admission: RE | Admit: 2019-04-17 | Discharge: 2019-04-17 | Disposition: A | Discharge status: Home / Self Care | Payer: Medicare HMO | Source: Ambulatory Visit | Attending: Cardiology | Admitting: Cardiology

## 2019-04-17 DIAGNOSIS — I5022 Chronic systolic (congestive) heart failure: Secondary | ICD-10-CM

## 2019-04-17 LAB — BASIC METABOLIC PANEL
Anion gap: 10 (ref 5–15)
BUN: 48 mg/dL — ABNORMAL HIGH (ref 8–23)
CO2: 31 mmol/L (ref 22–32)
Calcium: 8.6 mg/dL — ABNORMAL LOW (ref 8.9–10.3)
Chloride: 93 mmol/L — ABNORMAL LOW (ref 98–111)
Creatinine, Ser: 1.79 mg/dL — ABNORMAL HIGH (ref 0.61–1.24)
GFR calc Af Amer: 39 mL/min — ABNORMAL LOW (ref >60)
GFR calc non Af Amer: 34 mL/min — ABNORMAL LOW (ref >60)
Glucose, Bld: 109 mg/dL — ABNORMAL HIGH (ref 70–99)
Potassium: 4.3 mmol/L (ref 3.5–5.1)
Sodium: 134 mmol/L — ABNORMAL LOW (ref 135–145)

## 2019-04-17 NOTE — Addendum Note (Signed)
Addended by: Scarlette Calico on: 04/17/2019 11:19 AM   Modules accepted: Orders

## 2019-04-17 NOTE — Telephone Encounter (Signed)
  I called the patient. MRI shows multilevel biforaminal stenosis that may be a source of pain and paresthesia, no spinal stenosis.  If the pain is severe, we may consider an epidural steroid injection.  MRI lumbar 04/17/19:  IMPRESSION:   MRI lumbar spine (without) demonstrating: - Progressive degenerative spondylosis from L1-L5. Rotoscoliosis convex right centered at L2-3.   - At L3-4: disc bulging and facet hypertrophy with severe biforaminal stenosis. - At L4-5: disc bulging and facet hypertrophy with severe right foraminal stenosis. - At L5-S1: disc bulging and facet hypertrophy with severe right foraminal stenosis; right laminectomy noted. - At L2-3: disc bulging and facet hypertrophy with moderate left foraminal stenosis and left lateral recess stenosis.

## 2019-04-23 ENCOUNTER — Telehealth: Payer: Self-pay | Admitting: Neurology

## 2019-04-23 ENCOUNTER — Telehealth (HOSPITAL_COMMUNITY): Payer: Self-pay | Admitting: *Deleted

## 2019-04-23 DIAGNOSIS — G253 Myoclonus: Secondary | ICD-10-CM

## 2019-04-23 NOTE — Telephone Encounter (Signed)
Patient called stating he has been having stuttering and about a shock pain throughout his body and has shaking along with requested a CB to discuss

## 2019-04-23 NOTE — Addendum Note (Signed)
Addended by: Kathrynn Ducking on: 04/23/2019 06:25 PM   Modules accepted: Orders

## 2019-04-23 NOTE — Telephone Encounter (Signed)
Ryan [name], but your note doesn't really mention RLS. He says he is having a shocking pain through his body - is it just in his lower legs? If this is something new then he should be set up for follow up with Dr. Jannifer Franklin. If this is in his lower legs it is likely he needs epidural shot. But if this shocking sensation through his body is new then schedule him for follow up with Dr. Jannifer Franklin thanks

## 2019-04-23 NOTE — Telephone Encounter (Signed)
Take extra metolazone dose on Tuesday this week, skip Wednesday, take a dose Thursday.  Then go back to every Wednesday.

## 2019-04-23 NOTE — Telephone Encounter (Signed)
I called the patient.  The patient claims in the last 10 days or so he has had episodes of chills and shaking, he may have a shock sensation in the leg, then he will have problems with stuttering.  Not clear what the etiology is episodes are.  I will go ahead and order a CT of the brain and an EEG study.  I indicated to the patient that I am working part-time, he has requested to be switched over to eye doctor that is working full-time, I will see if I can arrange for this.

## 2019-04-23 NOTE — Telephone Encounter (Signed)
Late entry from 230pm. Today. Pt stated for the past week he has been having some stuttering at times and some electrical shock pain throughout this body. He is taking requip for his RLS. Pt was call by Dr.WIllis concerning his MR lumbar spine results, and it was recommend epidural steroid shot if pain increase. PT stated its not pain just electrical shock. I stated message will be sent to work in [place]. Pt verbalized understanding.

## 2019-04-23 NOTE — Telephone Encounter (Signed)
Pt called to report he gained 3lbs over the weekend and his legs are very swollen. Denies any chest pain, worsening shortness of breath, or fatigue. Pt takes Torsemide 69m twice daily with 134m of Potassium daily.  Last labs on 3/16 creatinine had improved to 1.79 and his potassium was 4.3. Pt also takes Metolazone 2.3m87meekly (wednesdays).  Routed to Dr.Darlington advise

## 2019-04-24 ENCOUNTER — Emergency Department (HOSPITAL_COMMUNITY): Payer: Medicare HMO

## 2019-04-24 ENCOUNTER — Other Ambulatory Visit: Payer: Self-pay

## 2019-04-24 ENCOUNTER — Telehealth: Payer: Self-pay | Admitting: Neurology

## 2019-04-24 ENCOUNTER — Inpatient Hospital Stay (HOSPITAL_COMMUNITY)
Admission: EM | Admit: 2019-04-24 | Discharge: 2019-04-27 | DRG: 260 | Disposition: A | Discharge status: Home Health | Payer: Medicare HMO | Attending: Internal Medicine | Admitting: Internal Medicine

## 2019-04-24 ENCOUNTER — Encounter (HOSPITAL_COMMUNITY): Payer: Self-pay | Admitting: Internal Medicine

## 2019-04-24 DIAGNOSIS — F419 Anxiety disorder, unspecified: Secondary | ICD-10-CM | POA: Diagnosis not present

## 2019-04-24 DIAGNOSIS — Z85820 Personal history of malignant melanoma of skin: Secondary | ICD-10-CM | POA: Diagnosis not present

## 2019-04-24 DIAGNOSIS — I639 Cerebral infarction, unspecified: Secondary | ICD-10-CM | POA: Diagnosis present

## 2019-04-24 DIAGNOSIS — N400 Enlarged prostate without lower urinary tract symptoms: Secondary | ICD-10-CM | POA: Diagnosis present

## 2019-04-24 DIAGNOSIS — E669 Obesity, unspecified: Secondary | ICD-10-CM | POA: Diagnosis present

## 2019-04-24 DIAGNOSIS — I6389 Other cerebral infarction: Secondary | ICD-10-CM | POA: Diagnosis not present

## 2019-04-24 DIAGNOSIS — E785 Hyperlipidemia, unspecified: Secondary | ICD-10-CM | POA: Diagnosis present

## 2019-04-24 DIAGNOSIS — M4800 Spinal stenosis, site unspecified: Secondary | ICD-10-CM | POA: Diagnosis present

## 2019-04-24 DIAGNOSIS — Z8042 Family history of malignant neoplasm of prostate: Secondary | ICD-10-CM | POA: Diagnosis not present

## 2019-04-24 DIAGNOSIS — G4730 Sleep apnea, unspecified: Secondary | ICD-10-CM | POA: Diagnosis present

## 2019-04-24 DIAGNOSIS — K5730 Diverticulosis of large intestine without perforation or abscess without bleeding: Secondary | ICD-10-CM | POA: Diagnosis present

## 2019-04-24 DIAGNOSIS — M159 Polyosteoarthritis, unspecified: Secondary | ICD-10-CM | POA: Diagnosis present

## 2019-04-24 DIAGNOSIS — K219 Gastro-esophageal reflux disease without esophagitis: Secondary | ICD-10-CM | POA: Diagnosis present

## 2019-04-24 DIAGNOSIS — N1831 Chronic kidney disease, stage 3a: Secondary | ICD-10-CM | POA: Diagnosis not present

## 2019-04-24 DIAGNOSIS — K449 Diaphragmatic hernia without obstruction or gangrene: Secondary | ICD-10-CM | POA: Diagnosis present

## 2019-04-24 DIAGNOSIS — R4781 Slurred speech: Secondary | ICD-10-CM | POA: Diagnosis present

## 2019-04-24 DIAGNOSIS — E119 Type 2 diabetes mellitus without complications: Secondary | ICD-10-CM

## 2019-04-24 DIAGNOSIS — C8590 Non-Hodgkin lymphoma, unspecified, unspecified site: Secondary | ICD-10-CM | POA: Diagnosis present

## 2019-04-24 DIAGNOSIS — J8410 Pulmonary fibrosis, unspecified: Secondary | ICD-10-CM | POA: Diagnosis present

## 2019-04-24 DIAGNOSIS — Z87891 Personal history of nicotine dependence: Secondary | ICD-10-CM | POA: Diagnosis not present

## 2019-04-24 DIAGNOSIS — Z20822 Contact with and (suspected) exposure to covid-19: Secondary | ICD-10-CM | POA: Diagnosis present

## 2019-04-24 DIAGNOSIS — R5382 Chronic fatigue, unspecified: Secondary | ICD-10-CM | POA: Diagnosis present

## 2019-04-24 DIAGNOSIS — F329 Major depressive disorder, single episode, unspecified: Secondary | ICD-10-CM | POA: Diagnosis present

## 2019-04-24 DIAGNOSIS — Z79899 Other long term (current) drug therapy: Secondary | ICD-10-CM

## 2019-04-24 DIAGNOSIS — R569 Unspecified convulsions: Secondary | ICD-10-CM | POA: Diagnosis not present

## 2019-04-24 DIAGNOSIS — I10 Essential (primary) hypertension: Secondary | ICD-10-CM | POA: Diagnosis present

## 2019-04-24 DIAGNOSIS — I5033 Acute on chronic diastolic (congestive) heart failure: Secondary | ICD-10-CM | POA: Diagnosis present

## 2019-04-24 DIAGNOSIS — E1140 Type 2 diabetes mellitus with diabetic neuropathy, unspecified: Secondary | ICD-10-CM | POA: Diagnosis present

## 2019-04-24 DIAGNOSIS — R29700 NIHSS score 0: Secondary | ICD-10-CM | POA: Diagnosis present

## 2019-04-24 DIAGNOSIS — I272 Pulmonary hypertension, unspecified: Secondary | ICD-10-CM | POA: Diagnosis not present

## 2019-04-24 DIAGNOSIS — N183 Chronic kidney disease, stage 3 unspecified: Secondary | ICD-10-CM | POA: Diagnosis present

## 2019-04-24 DIAGNOSIS — Z8249 Family history of ischemic heart disease and other diseases of the circulatory system: Secondary | ICD-10-CM

## 2019-04-24 DIAGNOSIS — Z66 Do not resuscitate: Secondary | ICD-10-CM | POA: Diagnosis present

## 2019-04-24 DIAGNOSIS — Z9109 Other allergy status, other than to drugs and biological substances: Secondary | ICD-10-CM | POA: Diagnosis not present

## 2019-04-24 DIAGNOSIS — I5032 Chronic diastolic (congestive) heart failure: Secondary | ICD-10-CM

## 2019-04-24 DIAGNOSIS — J449 Chronic obstructive pulmonary disease, unspecified: Secondary | ICD-10-CM | POA: Diagnosis present

## 2019-04-24 DIAGNOSIS — Z923 Personal history of irradiation: Secondary | ICD-10-CM | POA: Diagnosis not present

## 2019-04-24 DIAGNOSIS — R609 Edema, unspecified: Secondary | ICD-10-CM | POA: Diagnosis not present

## 2019-04-24 DIAGNOSIS — Z7984 Long term (current) use of oral hypoglycemic drugs: Secondary | ICD-10-CM

## 2019-04-24 DIAGNOSIS — C8580 Other specified types of non-Hodgkin lymphoma, unspecified site: Secondary | ICD-10-CM | POA: Diagnosis not present

## 2019-04-24 DIAGNOSIS — D631 Anemia in chronic kidney disease: Secondary | ICD-10-CM | POA: Diagnosis present

## 2019-04-24 DIAGNOSIS — Z6831 Body mass index (BMI) 31.0-31.9, adult: Secondary | ICD-10-CM

## 2019-04-24 DIAGNOSIS — I130 Hypertensive heart and chronic kidney disease with heart failure and stage 1 through stage 4 chronic kidney disease, or unspecified chronic kidney disease: Principal | ICD-10-CM | POA: Diagnosis present

## 2019-04-24 DIAGNOSIS — G2581 Restless legs syndrome: Secondary | ICD-10-CM | POA: Diagnosis present

## 2019-04-24 DIAGNOSIS — M109 Gout, unspecified: Secondary | ICD-10-CM | POA: Diagnosis present

## 2019-04-24 DIAGNOSIS — G253 Myoclonus: Secondary | ICD-10-CM | POA: Diagnosis present

## 2019-04-24 DIAGNOSIS — E1122 Type 2 diabetes mellitus with diabetic chronic kidney disease: Secondary | ICD-10-CM | POA: Diagnosis present

## 2019-04-24 HISTORY — DX: Type 2 diabetes mellitus without complications: E11.9

## 2019-04-24 LAB — CBC
HCT: 38.2 % — ABNORMAL LOW (ref 39.0–52.0)
Hemoglobin: 12 g/dL — ABNORMAL LOW (ref 13.0–17.0)
MCH: 28.6 pg (ref 26.0–34.0)
MCHC: 31.4 g/dL (ref 30.0–36.0)
MCV: 91 fL (ref 80.0–100.0)
Platelets: 288 10*3/uL (ref 150–400)
RBC: 4.2 MIL/uL — ABNORMAL LOW (ref 4.22–5.81)
RDW: 15.5 % (ref 11.5–15.5)
WBC: 8.2 10*3/uL (ref 4.0–10.5)
nRBC: 0 % (ref 0.0–0.2)

## 2019-04-24 LAB — BASIC METABOLIC PANEL
Anion gap: 13 (ref 5–15)
BUN: 53 mg/dL — ABNORMAL HIGH (ref 8–23)
CO2: 28 mmol/L (ref 22–32)
Calcium: 8.5 mg/dL — ABNORMAL LOW (ref 8.9–10.3)
Chloride: 93 mmol/L — ABNORMAL LOW (ref 98–111)
Creatinine, Ser: 2.01 mg/dL — ABNORMAL HIGH (ref 0.61–1.24)
GFR calc Af Amer: 34 mL/min — ABNORMAL LOW (ref >60)
GFR calc non Af Amer: 29 mL/min — ABNORMAL LOW (ref >60)
Glucose, Bld: 93 mg/dL (ref 70–99)
Potassium: 4.6 mmol/L (ref 3.5–5.1)
Sodium: 134 mmol/L — ABNORMAL LOW (ref 135–145)

## 2019-04-24 MED ORDER — FINASTERIDE 5 MG PO TABS
5.0000 mg | ORAL_TABLET | Freq: Every day | ORAL | Status: DC
  Administered 2019-04-25 – 2019-04-26 (×2): 5 mg via ORAL
  Filled 2019-04-24 (×3): qty 1

## 2019-04-24 MED ORDER — ESCITALOPRAM OXALATE 10 MG PO TABS
10.0000 mg | ORAL_TABLET | Freq: Every day | ORAL | Status: DC
  Administered 2019-04-25 – 2019-04-27 (×3): 10 mg via ORAL
  Filled 2019-04-24 (×3): qty 1

## 2019-04-24 MED ORDER — ACETAMINOPHEN 325 MG PO TABS: 650.0000 mg | ORAL_TABLET | ORAL | Status: DC | PRN

## 2019-04-24 MED ORDER — LATANOPROST 0.005 % OP SOLN
1.0000 [drp] | Freq: Every day | OPHTHALMIC | Status: DC
  Administered 2019-04-26: 23:00:00 1 [drp] via OPHTHALMIC
  Filled 2019-04-24 (×2): qty 2.5

## 2019-04-24 MED ORDER — SPIRONOLACTONE 25 MG PO TABS
25.0000 mg | ORAL_TABLET | Freq: Every day | ORAL | Status: DC
  Administered 2019-04-25 – 2019-04-27 (×3): 25 mg via ORAL
  Filled 2019-04-24 (×3): qty 1

## 2019-04-24 MED ORDER — ASPIRIN 325 MG PO TABS
325.0000 mg | ORAL_TABLET | Freq: Every day | ORAL | Status: DC
  Administered 2019-04-25 – 2019-04-27 (×3): 325 mg via ORAL
  Filled 2019-04-24 (×3): qty 1

## 2019-04-24 MED ORDER — RIOCIGUAT 1 MG PO TABS: 1.0000 mg | ORAL_TABLET | Freq: Three times a day (TID) | ORAL | Status: DC

## 2019-04-24 MED ORDER — ASPIRIN 300 MG RE SUPP: 300.0000 mg | Freq: Every day | RECTAL | Status: DC

## 2019-04-24 MED ORDER — SODIUM CHLORIDE 0.9% FLUSH
3.0000 mL | Freq: Once | INTRAVENOUS | Status: AC
  Administered 2019-04-24: 3 mL via INTRAVENOUS

## 2019-04-24 MED ORDER — ACETAMINOPHEN 160 MG/5ML PO SOLN: 650.0000 mg | ORAL | Status: DC | PRN

## 2019-04-24 MED ORDER — LORAZEPAM 2 MG/ML IJ SOLN
0.5000 mg | INTRAMUSCULAR | Status: DC | PRN
  Administered 2019-04-24: 0.5 mg via INTRAVENOUS
  Filled 2019-04-24 (×2): qty 1

## 2019-04-24 MED ORDER — TRAMADOL HCL 50 MG PO TABS
50.0000 mg | ORAL_TABLET | Freq: Once | ORAL | Status: AC
  Administered 2019-04-24: 50 mg via ORAL
  Filled 2019-04-24: qty 1

## 2019-04-24 MED ORDER — ENOXAPARIN SODIUM 30 MG/0.3ML ~~LOC~~ SOLN
30.0000 mg | SUBCUTANEOUS | Status: DC
  Administered 2019-04-25 – 2019-04-27 (×3): 30 mg via SUBCUTANEOUS
  Filled 2019-04-24 (×3): qty 0.3

## 2019-04-24 MED ORDER — POTASSIUM CHLORIDE CRYS ER 10 MEQ PO TBCR
10.0000 meq | EXTENDED_RELEASE_TABLET | Freq: Every day | ORAL | Status: DC
  Administered 2019-04-25 – 2019-04-27 (×3): 10 meq via ORAL
  Filled 2019-04-24 (×3): qty 1

## 2019-04-24 MED ORDER — INSULIN ASPART 100 UNIT/ML ~~LOC~~ SOLN
0.0000 [IU] | Freq: Three times a day (TID) | SUBCUTANEOUS | Status: DC
  Administered 2019-04-25: 1 [IU] via SUBCUTANEOUS

## 2019-04-24 MED ORDER — SELEXIPAG 1600 MCG PO TABS
1600.0000 ug | ORAL_TABLET | Freq: Two times a day (BID) | ORAL | Status: DC
  Administered 2019-04-25: 1600 ug via ORAL
  Filled 2019-04-24: qty 1

## 2019-04-24 MED ORDER — TRAMADOL HCL 50 MG PO TABS
50.0000 mg | ORAL_TABLET | Freq: Two times a day (BID) | ORAL | Status: DC | PRN
  Administered 2019-04-26 (×2): 50 mg via ORAL
  Filled 2019-04-24 (×2): qty 1

## 2019-04-24 MED ORDER — PANTOPRAZOLE SODIUM 40 MG PO TBEC
40.0000 mg | DELAYED_RELEASE_TABLET | Freq: Every day | ORAL | Status: DC
  Administered 2019-04-25 – 2019-04-27 (×3): 40 mg via ORAL
  Filled 2019-04-24 (×3): qty 1

## 2019-04-24 MED ORDER — DILTIAZEM HCL ER COATED BEADS 180 MG PO CP24
360.0000 mg | ORAL_CAPSULE | Freq: Every day | ORAL | Status: DC
  Administered 2019-04-25 – 2019-04-27 (×2): 360 mg via ORAL
  Filled 2019-04-24 (×2): qty 1
  Filled 2019-04-24 (×3): qty 2

## 2019-04-24 MED ORDER — METOLAZONE 2.5 MG PO TABS
2.5000 mg | ORAL_TABLET | ORAL | Status: DC
  Administered 2019-04-25: 2.5 mg via ORAL
  Filled 2019-04-24: qty 1

## 2019-04-24 MED ORDER — TRAZODONE HCL 100 MG PO TABS
100.0000 mg | ORAL_TABLET | Freq: Every day | ORAL | Status: DC
  Administered 2019-04-25 – 2019-04-26 (×2): 100 mg via ORAL
  Filled 2019-04-24 (×2): qty 1

## 2019-04-24 MED ORDER — ZOLPIDEM TARTRATE 5 MG PO TABS
5.0000 mg | ORAL_TABLET | Freq: Once | ORAL | Status: AC
  Administered 2019-04-24: 5 mg via ORAL
  Filled 2019-04-24: qty 1

## 2019-04-24 MED ORDER — STROKE: EARLY STAGES OF RECOVERY BOOK
Freq: Once | Status: AC
  Filled 2019-04-24: qty 1

## 2019-04-24 MED ORDER — TORSEMIDE 20 MG PO TABS
80.0000 mg | ORAL_TABLET | Freq: Two times a day (BID) | ORAL | Status: DC
  Administered 2019-04-25: 80 mg via ORAL
  Filled 2019-04-24 (×2): qty 4

## 2019-04-24 MED ORDER — ALLOPURINOL 100 MG PO TABS
50.0000 mg | ORAL_TABLET | Freq: Every day | ORAL | Status: DC
  Administered 2019-04-25 – 2019-04-27 (×3): 50 mg via ORAL
  Filled 2019-04-24 (×3): qty 1

## 2019-04-24 MED ORDER — ACETAMINOPHEN 650 MG RE SUPP: 650.0000 mg | RECTAL | Status: DC | PRN

## 2019-04-24 NOTE — ED Notes (Signed)
Pt transported to MRI

## 2019-04-24 NOTE — ED Notes (Signed)
Pt requested to sit on the edge of the bed. This RN & Bryson Ha sat with pt for 26mn to ascertain if pt was steady enough to be left alone. Pt A&Ox4, VSS, NAD sitting on side of the bed. Will check back on pt at 2200 & re-settle pt in bed at that time.

## 2019-04-24 NOTE — ED Notes (Signed)
Pt given [place] sandwich & ice water

## 2019-04-24 NOTE — Telephone Encounter (Signed)
I would be happy to, thanks

## 2019-04-24 NOTE — H&P (Signed)
History and Physical    Ryan [name] AXK:[id number] DOB: Jun 30, 1931 DOA: 04/24/2019  PCP: Roetta Sessions, NP  Patient coming from: [name].  Chief Complaint: Stuttering and increasing abnormal movements of the lower extremity.  HPI: Ryan [name] is a 86 y.o. male with history of interstitial lung disease on home oxygen, diastolic CHF prior non-Hodgkin's lymphoma pelvic mass status post radiation therapy diabetes mellitus type 2 chronic kidney disease stage IV anemia presents to the ER because of increasing stuttering and increasing abnormal movements of the lower extremities.  Patient states over the last 1 week patient stuttering is worsened and also his abnormal movements of the lower extremity.  For his abnormal movements of the lower extremity he had followed with Dr. Jannifer Franklin neurologist and was started on ropinirole in February 2021 last month.  Patient had run out of his ropinirole 4 days ago.  He is lower extremity abnormal movement has worsened.  Patient also has been having stuttering.  Given the symptoms patient was brought to the ER.  In addition patient also noted increasing bilateral lower extremity edema more than usual.  But denies any chest pain or shortness of breath fever chills.  Denies any weakness of upper or lower extremity denies any headache or visual symptoms.  ED Course: In the ER on exam patient is able to move all extremities.  Alert and awake and oriented time place and person.  Labs show creatinine of 2 which is around his baseline.  Hemoglobin 12 Covid test was negative.  Chest x-ray was unremarkable.  Given the symptoms MRI brain was done which shows diffusion abnormality in the right occipital area concerning for acute CVA for which neurology has been consulted.  Review of Systems: As per HPI, rest all negative.   Past Medical History:  Diagnosis Date  . Asthma   . Barrett's esophagus   . Benign prostatic hypertrophy   . Bilateral lower  extremity edema   . CHF (congestive heart failure) ([place])   . Chronic fatigue   . COPD (chronic obstructive pulmonary disease) ([place])   . Diabetes mellitus type 2 in nonobese ([place]) 04/24/2019  . Diverticulosis of colon (without mention of hemorrhage)   . Esophageal reflux   . Gastritis   . Gluten intolerance   . Hiatal hernia 02/10/11   Noted on CT Scan - Moderate Hiatal Hernia  . Hyperlipemia   . Hypertension   . Lymphoma of lymph nodes in pelvis ([place]) 03/03/2011    Large Right Retroperitoneal Mass  . Melanoma ([place])    lymphoma  . Microcytic anemia 04/20/2011   . 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 Osteoarthritis    hands/feet,knees, NECK, BACK  . Periaortic lymphadenopathy 02/16/2011  . Personal history of colonic polyps 2004   hyperplastic Dr. Collene Mares  . Pulmonary hyperinflation   . Renal cyst 02/10/11    Noted on CT Scan - Bilateral Renal Cysts  . S/P radiation therapy 03/15/11 - 04/09/11   Abdominal/ Pelvic Tumor, 3600 cGy/20 Fractions    Past Surgical History:  Procedure Laterality Date  . ARTHROSCOPIC REPAIR ACL     right  . BONE MARROW ASPIRATION  02/25/11   Bone Marrow, Aspirate, Clot, and Bilateral Bx, Right PIC  . CATARACT EXTRACTION, BILATERAL    . EYE SURGERY    . HERNIA REPAIR     LEFT INGUINAL   . INSERTION OF MESH N/A 08/23/2012   Procedure: INSERTION OF  MESH;  Surgeon: Madilyn Hook, DO;  Location: [facility];  Service: General;  Laterality: N/A;  . LUMBAR LAMINECTOMY/DECOMPRESSION MICRODISCECTOMY Right 12/31/2013   Procedure: RIGHT L4-5 L5-S1 LAMINECTOMY;  Surgeon: Kristeen Miss, MD;  Location: [place];  Service: Neurosurgery;  Laterality: Right;  RIGHT L4-5 L5-S1 LAMINECTOMY  . RIGHT HEART CATH N/A 07/25/2018   Procedure: RIGHT HEART CATH;  Surgeon: Larey Dresser, MD;  Location: [facility];  Service: Cardiovascular;  Laterality: N/A;  . ROTATOR CUFF REPAIR     right  . TONSILLECTOMY    .  TONSILLECTOMY    . VENTRAL HERNIA REPAIR N/A 08/23/2012   Procedure: HERNIA REPAIR VENTRAL ADULT;  Surgeon: Madilyn Hook, DO;  Location: [facility];  Service: General;  Laterality: N/A;     reports that he quit smoking about 27 years ago. His smoking use included pipe and cigarettes. He has a 35.00 pack-year smoking history. He has never used smokeless tobacco. He reports current alcohol use. He reports that he does not use drugs.  Allergies  Allergen Reactions  . Pollen Extract-Tree Extract Other (See Comments)    HEADACHES, TIRED , DRAINAGE FROM SINUSES  . Molds & Smuts Other (See Comments)    Also dust mites causes sinus infections, h/a etc.    Family History  Problem Relation Age of Onset  . Heart disease Father        MI 7  . Urticaria Father   . Prostate cancer Brother   . Prostate cancer Paternal Uncle   . Prostate cancer Paternal Uncle   . Prostate cancer Paternal Uncle   . Hyperlipidemia Other   . Stroke Other   . Hypertension Other   . ADD / ADHD Other   . Colon cancer Neg Hx   . Allergic rhinitis Neg Hx   . Asthma Neg Hx   . Eczema Neg Hx     Prior to Admission medications   Medication Sig Start Date End Date Taking? Authorizing Provider  allopurinol (ZYLOPRIM) 100 MG tablet Take 50 mg by mouth daily. 12/13/17   [provider]  diazepam (VALIUM) 5 MG tablet Take 5 mg by mouth 2 (two) times daily as needed. 10/30/18   [provider]  diltiazem (CARDIZEM CD) 360 MG 24 hr capsule Take 1 capsule (360 mg total) by mouth daily. 10/04/18   Larey Dresser, MD  escitalopram (LEXAPRO) 10 MG tablet Take 10 mg by mouth daily.    [provider]  finasteride (PROSCAR) 5 MG tablet Take 1 tablet (5 mg total) by mouth daily. Patient taking differently: Take 5 mg by mouth at bedtime.  12/25/12   Norins, Heinz Knuckles, MD  FLONASE SENSIMIST 27.5 MCG/SPRAY nasal spray 1 spray daily as needed.  12/11/18   [provider]  glipiZIDE (GLUCOTROL) 5 MG tablet  Take 0.5 tablets (2.5 mg total) by mouth daily before breakfast. 09/27/18 03/20/19  Amin, Jeanella Flattery, MD  ipratropium (ATROVENT) 0.03 % nasal spray Place 2 sprays into both nostrils 2 (two) times daily. 02/05/19   [provider]  latanoprost (XALATAN) 0.005 % ophthalmic solution INSTILL ONE DROP BY OPHTHALMIC ROUTE EVERY NIGHT AT BEDTIME INTO AFFECTED EYE 01/20/18   [provider]  methocarbamol (ROBAXIN) 500 MG tablet Take 500 mg by mouth 3 (three) times daily.    [provider]  metolazone (ZAROXOLYN) 2.5 MG tablet Take 1 tablet (2.5 mg total) by mouth once a week. Every Wednesday 04/10/19   Larey Dresser, MD  omeprazole (PRILOSEC) 20 MG capsule Take 20 mg by mouth daily.    [provider]  Potassium Chloride ER 20 MEQ TBCR Take 10 mEq by mouth daily. 10/04/18   Larey Dresser, MD  Riociguat (ADEMPAS) 1 MG TABS Take 1 mg by mouth 3 (three) times daily. 03/14/19   Larey Dresser, MD  rOPINIRole (REQUIP) 2 MG tablet Take 1 tablet (2 mg total) by mouth in the morning, at noon, and at bedtime. 03/20/19   Kathrynn Ducking, MD  Selexipag 1600 MCG TABS Take 1,600 mcg by mouth 2 (two) times daily.    [provider]  spironolactone (ALDACTONE) 25 MG tablet Take 1 tablet (25 mg total) by mouth daily. 09/27/18 03/20/19  Amin, Jeanella Flattery, MD  torsemide (DEMADEX) 20 MG tablet Take 4 tablets (80 mg total) by mouth 2 (two) times daily. 04/11/19 05/11/19  Larey Dresser, MD  traMADol (ULTRAM) 50 MG tablet Take 50 mg by mouth every 12 (twelve) hours as needed for moderate pain.     [provider]  traZODone (DESYREL) 100 MG tablet Take 100 mg by mouth at bedtime. 02/12/19   [provider]    Physical Exam: Constitutional: Moderately built and nourished. Vitals:   04/24/19 2145 04/24/19 2215 04/24/19 2230 04/24/19 2245  BP: 108/61 114/72 (!) 104/46 97/70  Pulse: 67 (!) 146 75 71  Resp: (!) _0 Temp:      TempSrc:      SpO2: 98% 98%  98% 95%  Weight:      Height:       Eyes: Anicteric no pallor. ENMT: No discharge from the ears eyes nose or mouth. Neck: No mass felt.  No neck rigidity. Respiratory: No rhonchi or crepitations. Cardiovascular: S1-S2 heard. Abdomen: Soft nontender bowel sounds present. Musculoskeletal: Bilateral lower extremity edema present. Skin: No rash. Neurologic: Alert awake oriented time place and person.  Moves all extremities 5 x 5.  No facial asymmetry tongue is midline. Psychiatric: Appears normal.   Labs on Admission: I have personally reviewed following labs and imaging studies  CBC: Recent Labs  Lab 04/24/19 1024  WBC 8.2  HGB 12.0*  HCT 38.2*  MCV 91.0  PLT 272   Basic Metabolic Panel: Recent Labs  Lab 04/24/19 1024  NA 134*  K 4.6  CL 93*  CO2 28  GLUCOSE 93  BUN 53*  CREATININE 2.01*  CALCIUM 8.5*   GFR: Estimated Creatinine Clearance: 28.7 mL/min (A) (by C-G formula based on SCr of 2.01 mg/dL (H)). Liver Function Tests: No results for input(s): AST, ALT, ALKPHOS, BILITOT, PROT, ALBUMIN in the last 168 hours. No results for input(s): LIPASE, AMYLASE in the last 168 hours. No results for input(s): AMMONIA in the last 168 hours. Coagulation Profile: No results for input(s): INR, PROTIME in the last 168 hours. Cardiac Enzymes: No results for input(s): CKTOTAL, CKMB, CKMBINDEX, TROPONINI in the last 168 hours. BNP (last 3 results) No results for input(s): PROBNP in the last 8760 hours. HbA1C: No results for input(s): HGBA1C in the last 72 hours. CBG: No results for input(s): GLUCAP in the last 168 hours. Lipid Profile: No results for input(s): CHOL, HDL, LDLCALC, TRIG, CHOLHDL, LDLDIRECT in the last 72 hours. Thyroid Function Tests: No results for input(s): TSH, T4TOTAL, FREET4, T3FREE, THYROIDAB in the last 72 hours. Anemia Panel: No results for input(s): VITAMINB12, FOLATE, FERRITIN, TIBC, IRON, RETICCTPCT in the last 72 hours. Urine analysis:      Component Value  Date/Time   COLORURINE STRAW (A) 09/07/2018 1241   APPEARANCEUR CLEAR 09/07/2018 1241   LABSPEC 1.009 09/07/2018 1241   LABSPEC 1.025 06/28/2013 1321   PHURINE 6.0 09/07/2018 1241   GLUCOSEU 50 (A) 09/07/2018 1241   GLUCOSEU Negative 06/28/2013 1321   HGBUR NEGATIVE 09/07/2018 1241   BILIRUBINUR NEGATIVE 09/07/2018 1241   BILIRUBINUR Negative 06/28/2013 1321   KETONESUR NEGATIVE 09/07/2018 1241   PROTEINUR NEGATIVE 09/07/2018 1241   UROBILINOGEN 1.0 01/04/2014 2156   UROBILINOGEN 0.2 06/28/2013 1321   NITRITE NEGATIVE 09/07/2018 1241   LEUKOCYTESUR NEGATIVE 09/07/2018 1241   LEUKOCYTESUR Negative 06/28/2013 1321   Sepsis Labs: _0 (procalcitonin:4,lacticidven:4) )No results found for this or any previous visit (from the past 240 hour(s)).   Radiological Exams on Admission: DG Chest 2 View  Result Date: 04/24/2019 CLINICAL DATA:  Lower extremity edema. EXAM: CHEST - 2 VIEW COMPARISON:  09/20/2018 FINDINGS: The heart is mildly enlarged but stable. Stable tortuosity of the thoracic aorta. Stable large hiatal hernia containing a good portion of the stomach. There is overlying vascular crowding and atelectasis. Streaky right basilar atelectasis is also noted. No definite infiltrates or effusions. No pulmonary edema. IMPRESSION: 1. Stable cardiac enlargement and large hiatal hernia. 2. Bibasilar atelectasis. Electronically Signed   By: Marijo Sanes M.D.   On: 04/24/2019 10:44   MR BRAIN WO CONTRAST  Result Date: 04/24/2019 CLINICAL DATA:  86 year old male with increasing bilateral lower extremity swelling. Stuttering, lower extremity spasms. EXAM: MRI HEAD WITHOUT CONTRAST TECHNIQUE: Multiplanar, multiecho pulse sequences of the brain and surrounding structures were obtained without intravenous contrast. COMPARISON:  Head CT 09/23/2018. FINDINGS: Brain: Punctate area of abnormal diffusion in the right occipital lobe white matter near the occipital horn on series 4,  image 19. Associated mild T2 hyperintensity. No other restricted diffusion or evidence of acute infarction. No chronic cerebral blood products or definite cortical encephalomalacia identified. Axial FLAIR images are degraded by artifact with scattered generally mild for age T2 and FLAIR hyperintensity. T2 heterogeneity in the bilateral deep gray nuclei appears to be combination of perivascular spaces and occasional small chronic lacunar infarcts. Negative brainstem and cerebellum. No midline shift, mass effect, evidence of mass lesion, ventriculomegaly, extra-axial collection or acute intracranial hemorrhage. Cervicomedullary junction and pituitary are within normal limits. The examination had to be discontinued prior to completion due to excessive patient motion. No coronal T2 imaging was obtained. Vascular: Major intracranial vascular flow voids are preserved. Dominant and dolichoectatic left vertebral artery with underlying generalized intracranial arterial tortuosity. Skull and upper cervical spine: Negative visible cervical spine. Normal bone marrow signal. Sinuses/Orbits: Negative, postoperative changes to both globes. Other: Mastoids are clear. Visible internal auditory structures appear normal. Scalp and face soft tissues appear negative. IMPRESSION: 1. Punctate diffusion abnormality in the right occipital lobe white matter compatible with a tiny acute infarct. However, clinical significance is unclear. There is no associated hemorrhage or mass effect. 2. No other acute intracranial abnormality identified. Generally mild for age signal changes elsewhere in the brain compatible with chronic small vessel disease. Electronically Signed   By: Genevie Ann M.D.   On: 04/24/2019 19:35     Assessment/Plan Principal Problem:   Acute CVA (cerebrovascular accident) [facility]) Active Problems:   Non Hodgkin's lymphoma ([place])   Essential hypertension   Chronic diastolic CHF (congestive heart failure) ([facility])   Pulmonary  hypertension ([facility])   CKD (chronic kidney disease) stage 3, GFR 30-59 ml/min   Diabetes mellitus type 2 in nonobese ([place])    1. Acute  CVA -discussed with on-call neurologist Dr. Caryn Section in the room and at this time neurology does not feel that is lower extremity abnormal movements is linked to the stroke.  Patient passed swallow.  On aspirin now.  We will keep patient on neurochecks check 2D echo carotid Doppler physical therapy consult check hemoglobin A1c lipid panel. 2. Abnormal lower extremity movements cause not clear.  At this time neurology recommended to restart his ropinirole.  Physical therapy consult further recommendation per neurology. 3. Worsening lower extremity edema with history of diastolic CHF and pulmonary hypertension interstitial lung disease will consult cardiology and continue present home medications including Adempas, selexipag, torsemide spironolactone potassium. 4. Diabetes mellitus type 2 we will keep patient sliding scale coverage. 5. Chronic kidney disease stage II creatinine appears to be at baseline. 6. Depression on Lexapro. 7. Anemia follow CBC. 8. History of gout on allopurinol. 9. BPH on finasteride. 10. History of sleep apnea stable using CPAP. 11. History of wandering pacemaker.  On Cardizem 360 mg p.o. daily.  Given the acute CVA with worsening lower extremity edema with known history of pulmonary hypertension will need close monitoring for any further worsening in inpatient status.  EKG is pending but monitor shows sinus rhythm.   DVT prophylaxis: Lovenox. Code Status: DNR. Family Communication: Patient's wife. Disposition Plan: Possible rehab. Consults called: Neurology and cardiology. Admission status: Inpatient.   Rise Patience MD [facility] Pager [phone].  If 7PM-7AM, please contact night-coverage [email or web address] Password [email or web address]  04/24/2019, 11:32 PM

## 2019-04-24 NOTE — ED Triage Notes (Signed)
Pt here for bilateral leg swelling x 2 weeks, worsening over the last two days. Hx CHF, denies shob. Sts he had spasms in his legs all night. Intermittent stuttering and shivering x 1 week.

## 2019-04-24 NOTE — ED Notes (Signed)
Admitting provider at bedside

## 2019-04-24 NOTE — Telephone Encounter (Signed)
Note pt is currently in the ED for cc of leg swelling. An MRI brain has been ordered today.

## 2019-04-24 NOTE — ED Provider Notes (Signed)
Has mild swelling to legs - no sob  Increased diuretic Now having shaking =- electric like shooting pains up the body.  As well as stuttering Neuro recommended MRI, for stuttering and electrical pains  The patient has a new acute infarct on his MRI, will discuss with neurology, discussed with the hospitalist will admit  D/w Dr. Hal Hope who will admit   Noemi Chapel, MD 04/24/19 2005

## 2019-04-24 NOTE — ED Notes (Addendum)
Pt's wife requests a low/no sodium diet as well as gluten free due his sensitivities.

## 2019-04-24 NOTE — ED Notes (Signed)
Ryan [name] wife [phone] looking for an update

## 2019-04-24 NOTE — ED Provider Notes (Signed)
Emergency Department Provider Note   I have reviewed the triage vital signs and the nursing notes.   HISTORY  Chief Complaint Leg Swelling   HPI Ryan [name] is a 86 y.o. male with PMH reviewed below presents to the emergency department for evaluation of electrical type shooting pain in the legs, worse in the left, that comes up through the body causing shaking and stuttering type speech abnormality. Patient tells me that he has had what he thought was restless leg syndrome for many years but that over the past 10 days he has had worsening shooting type pains as described with stuttering which is new over the past 10 days. He also notes some associated bilateral lower extremity swelling. He has been compliant with his home medications.  The patient spoke with his neurologist's office yesterday who spoke with Dr. Jannifer Franklin who is attempting to set up outpatient CT head and EEG for the above-described symptoms. Patient had a particularly bad night of symptoms last night and called again this morning and was advised to present to the emergency department for evaluation. Patient has baseline numbness in the feet and that has not changed. Denies vision changes. No numbness or unilateral weakness in the upper or lower extremities.   He also called his cardiologist yesterday who has a plan to increase his metolazone temporarily and they will follow him in the office. He is due to make the change tomorrow and plans to do so. He denies any chest pain or shortness of breath.   Past Medical History:  Diagnosis Date  . Asthma   . Barrett's esophagus   . Benign prostatic hypertrophy   . Bilateral lower extremity edema   . CHF (congestive heart failure) ([place])   . Chronic fatigue   . COPD (chronic obstructive pulmonary disease) ([place])   . Diabetes mellitus type 2 in nonobese ([place]) 04/24/2019  . Diverticulosis of colon (without mention of hemorrhage)   . Esophageal reflux   . Gastritis   . Gluten  intolerance   . Hiatal hernia 02/10/11   Noted on CT Scan - Moderate Hiatal Hernia  . Hyperlipemia   . Hypertension   . 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 Melanoma ([place])    lymphoma  . Microcytic anemia 04/20/2011   . 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 Osteoarthritis    hands/feet,knees, NECK, BACK  . Periaortic lymphadenopathy 02/16/2011  . Personal history of colonic polyps 2004   hyperplastic Dr. Collene Mares  . Pulmonary hyperinflation   . Renal cyst 02/10/11    Noted on CT Scan - Bilateral Renal Cysts  . S/P radiation therapy 03/15/11 - 04/09/11   Abdominal/ Pelvic Tumor, 3600 cGy/20 Fractions    Patient Active Problem List   Diagnosis Date Noted  . Acute CVA (cerebrovascular accident) ([place]) 04/24/2019  . Diabetes mellitus type 2 in nonobese ([place]) 04/24/2019  . Acute respiratory failure with hypoxia and hypercarbia ([place]) 09/20/2018  . CKD (chronic kidney disease) stage 3, GFR 30-59 ml/min 09/20/2018  . Lethargy 09/20/2018  . Acute metabolic encephalopathy 74/25/9563  . CHF (congestive heart failure) ([place]) 09/20/2018  . Acute kidney injury superimposed on CKD ([place]) 09/10/2018  . Pulmonary hypertension ([place]) 09/10/2018  . Multifocal atrial tachycardia ([place]) 09/10/2018  . CHF exacerbation ([place]) 09/07/2018  . Leg pain 09/07/2018  . Hyperkalemia 07/26/2018  . Chronic diastolic CHF (congestive heart failure) ([place])   .  Bilateral lower extremity edema 02/02/2017  . Pulmonary fibrosis ([place]) 11/22/2016  . Blurry vision, bilateral 10/26/2016  . COPD ([place]) 07/05/2016  . Exertional dyspnea 09/10/2015  . Fatigue/malaise 08/19/2015  . Asthma/COPD 02/12/2015  . 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 Hypoxemia 01/05/2014  . Essential hypertension 01/05/2014  . Hypokalemia 01/05/2014  . Urinary retention 01/05/2014  . Hypoxia   . Lumbar radiculopathy 12/31/2013  . RLS  (restless legs syndrome) 09/21/2012  . Chronic lower back pain 06/15/2012  . Abdominal pain, unspecified site 05/19/2012  . Unspecified deficiency anemia 04/07/2012  . Melanoma ([place])   . Non Hodgkin's lymphoma ([place])   . Periaortic lymphadenopathy 02/16/2011  . Barrett's esophagus 07/09/2010  . Personal history of colonic polyps 05/28/2010  . History of IBS 05/28/2010  . Diverticulosis of colon (without mention of hemorrhage) 05/28/2010  . Arthritis involving multiple sites 05/28/2010  . Wheat intolerance 05/28/2010  . Chronic venous insufficiency 08/19/2009  . PEDAL EDEMA 08/19/2009  . INSOMNIA, CHRONIC 12/24/2008  . EUSTACHIAN TUBE DYSFUNCTION, BILATERAL 12/23/2008  . ERECTILE DYSFUNCTION, ORGANIC 08/01/2008  . Allergic rhinitis with a nonallergic component 05/20/2008  . VENTRAL HERNIA 02/12/2008  . PALPITATIONS 12/25/2007  . LACTOSE INTOLERANCE 10/17/2007  . Hyperlipidemia 09/14/2006  . BPH (benign prostatic hyperplasia) 09/14/2006  . OSTEOARTHRITIS 09/14/2006  . HEART MURMUR, HX OF 09/14/2006    Past Surgical History:  Procedure Laterality Date  . ARTHROSCOPIC REPAIR ACL     right  . BONE MARROW ASPIRATION  02/25/11   Bone Marrow, Aspirate, Clot, and Bilateral Bx, Right PIC  . CATARACT EXTRACTION, BILATERAL    . EYE SURGERY    . HERNIA REPAIR     LEFT INGUINAL   . INSERTION OF MESH N/A 08/23/2012   Procedure: INSERTION OF MESH;  Surgeon: Madilyn Hook, DO;  Location: [facility];  Service: General;  Laterality: N/A;  . LUMBAR LAMINECTOMY/DECOMPRESSION MICRODISCECTOMY Right 12/31/2013   Procedure: RIGHT L4-5 L5-S1 LAMINECTOMY;  Surgeon: Kristeen Miss, MD;  Location: [facility];  Service: Neurosurgery;  Laterality: Right;  RIGHT L4-5 L5-S1 LAMINECTOMY  . RIGHT HEART CATH N/A 07/25/2018   Procedure: RIGHT HEART CATH;  Surgeon: Larey Dresser, MD;  Location: [facility];  Service: Cardiovascular;  Laterality: N/A;  . ROTATOR CUFF REPAIR     right  . TONSILLECTOMY    .  TONSILLECTOMY    . VENTRAL HERNIA REPAIR N/A 08/23/2012   Procedure: HERNIA REPAIR VENTRAL ADULT;  Surgeon: Madilyn Hook, DO;  Location: [facility];  Service: General;  Laterality: N/A;    Allergies Pollen extract-tree extract and Molds & smuts  Family History  Problem Relation Age of Onset  . Heart disease Father        MI 76  . Urticaria Father   . Prostate cancer Brother   . Prostate cancer Paternal Uncle   . Prostate cancer Paternal Uncle   . Prostate cancer Paternal Uncle   . Hyperlipidemia Other   . Stroke Other   . Hypertension Other   . ADD / ADHD Other   . Colon cancer Neg Hx   . Allergic rhinitis Neg Hx   . Asthma Neg Hx   . Eczema Neg Hx     Social History Social History   Tobacco Use  . Smoking status: Former Smoker    Packs/day: 1.00    Years: 35.00    Pack years: 35.00    Types: Pipe, Cigarettes    Quit date: 02/23/1992    Years since quitting:  27.1  . Smokeless tobacco: Never Used  . Tobacco comment: quit 20 years ago  Substance Use Topics  . Alcohol use: Yes    Comment: 2 drinks daily scotch  AND WINE WITH SUPPER  . Drug use: No    Review of Systems  Constitutional: No fever/chills Eyes: No visual changes. ENT: No sore throat. Cardiovascular: Denies chest pain. Respiratory: Denies shortness of breath. Gastrointestinal: No abdominal pain.  No nausea, no vomiting.  No diarrhea.  No constipation. Genitourinary: Negative for dysuria. Musculoskeletal: Negative for back pain. Skin: Negative for rash. Neurological: Negative for headaches. Intermittent shaking episodes with electrical, shooting type pains in the legs and arms with new onset stuttering.   10-point ROS otherwise negative.  ____________________________________________   PHYSICAL EXAM:  VITAL SIGNS: ED Triage Vitals  Enc Vitals Group     BP 04/24/19 1008 (!) 108/58     Pulse Rate 04/24/19 1008 92     Resp 04/24/19 1008 17     Temp 04/24/19 1008 98.5 F (36.9 C)     Temp Source  04/24/19 1008 Oral     SpO2 04/24/19 1008 93 %     Weight 04/24/19 1008 197 lb (89.4 kg)     Height 04/24/19 1008 5' 7" (1.702 m)   Constitutional: Alert and oriented. Well appearing and in no acute distress. Eyes: Conjunctivae are normal. PERRL.  Head: Atraumatic. Nose: No congestion/rhinnorhea. Mouth/Throat: Mucous membranes are moist. Neck: No stridor.   Cardiovascular: Normal rate, regular rhythm. Good peripheral circulation. Grossly normal heart sounds.   Respiratory: Normal respiratory effort.  No retractions. Lungs CTAB. Gastrointestinal: Soft and nontender. No distention.  Musculoskeletal: No lower extremity tenderness nor edema. No gross deformities of extremities. Neurologic:  Normal language. Occasional stuttering speech. Normal strength and sensation in the bilateral upper and lower extremities. No facial asymmetry.  Skin:  Skin is warm, dry and intact. No rash noted.   ____________________________________________   LABS (all labs ordered are listed, but only abnormal results are displayed)  Labs Reviewed  BASIC METABOLIC PANEL - Abnormal; Notable for the following components:      Result Value   Sodium 134 (*)    Chloride 93 (*)    BUN 53 (*)    Creatinine, Ser 2.01 (*)    Calcium 8.5 (*)    GFR calc non Af Amer 29 (*)    GFR calc Af Amer 34 (*)    All other components within normal limits  CBC - Abnormal; Notable for the following components:   RBC 4.20 (*)    Hemoglobin 12.0 (*)    HCT 38.2 (*)    All other components within normal limits  HEMOGLOBIN A1C - Abnormal; Notable for the following components:   Hgb A1c MFr Bld 5.9 (*)    All other components within normal limits  LIPID PANEL - Abnormal; Notable for the following components:   HDL 35 (*)    LDL Cholesterol 103 (*)    All other components within normal limits  CBC - Abnormal; Notable for the following components:   RBC 4.05 (*)    Hemoglobin 11.6 (*)    HCT 36.8 (*)    All other components  within normal limits  CREATININE, SERUM - Abnormal; Notable for the following components:   Creatinine, Ser 1.84 (*)    GFR calc non Af Amer 33 (*)    GFR calc Af Amer 38 (*)    All other components within normal limits  COMPREHENSIVE METABOLIC PANEL -  Abnormal; Notable for the following components:   Sodium 134 (*)    Chloride 94 (*)    BUN 46 (*)    Creatinine, Ser 1.76 (*)    Calcium 8.2 (*)    Total Protein 6.0 (*)    Albumin 3.3 (*)    GFR calc non Af Amer 34 (*)    GFR calc Af Amer 40 (*)    All other components within normal limits  MAGNESIUM - Abnormal; Notable for the following components:   Magnesium 2.5 (*)    All other components within normal limits  SARS CORONAVIRUS 2 (TAT 6-24 HRS)  MRSA PCR SCREENING  GLUCOSE, CAPILLARY   ____________________________________________  RADIOLOGY  DG Chest 2 View  Result Date: 04/24/2019 CLINICAL DATA:  Lower extremity edema. EXAM: CHEST - 2 VIEW COMPARISON:  09/20/2018 FINDINGS: The heart is mildly enlarged but stable. Stable tortuosity of the thoracic aorta. Stable large hiatal hernia containing a good portion of the stomach. There is overlying vascular crowding and atelectasis. Streaky right basilar atelectasis is also noted. No definite infiltrates or effusions. No pulmonary edema. IMPRESSION: 1. Stable cardiac enlargement and large hiatal hernia. 2. Bibasilar atelectasis. Electronically Signed   By: Marijo Sanes M.D.   On: 04/24/2019 10:44   MR BRAIN WO CONTRAST  Result Date: 04/24/2019 CLINICAL DATA:  86 year old male with increasing bilateral lower extremity swelling. Stuttering, lower extremity spasms. EXAM: MRI HEAD WITHOUT CONTRAST TECHNIQUE: Multiplanar, multiecho pulse sequences of the brain and surrounding structures were obtained without intravenous contrast. COMPARISON:  Head CT 09/23/2018. FINDINGS: Brain: Punctate area of abnormal diffusion in the right occipital lobe white matter near the occipital horn on series 4,  image 19. Associated mild T2 hyperintensity. No other restricted diffusion or evidence of acute infarction. No chronic cerebral blood products or definite cortical encephalomalacia identified. Axial FLAIR images are degraded by artifact with scattered generally mild for age T2 and FLAIR hyperintensity. T2 heterogeneity in the bilateral deep gray nuclei appears to be combination of perivascular spaces and occasional small chronic lacunar infarcts. Negative brainstem and cerebellum. No midline shift, mass effect, evidence of mass lesion, ventriculomegaly, extra-axial collection or acute intracranial hemorrhage. Cervicomedullary junction and pituitary are within normal limits. The examination had to be discontinued prior to completion due to excessive patient motion. No coronal T2 imaging was obtained. Vascular: Major intracranial vascular flow voids are preserved. Dominant and dolichoectatic left vertebral artery with underlying generalized intracranial arterial tortuosity. Skull and upper cervical spine: Negative visible cervical spine. Normal bone marrow signal. Sinuses/Orbits: Negative, postoperative changes to both globes. Other: Mastoids are clear. Visible internal auditory structures appear normal. Scalp and face soft tissues appear negative. IMPRESSION: 1. Punctate diffusion abnormality in the right occipital lobe white matter compatible with a tiny acute infarct. However, clinical significance is unclear. There is no associated hemorrhage or mass effect. 2. No other acute intracranial abnormality identified. Generally mild for age signal changes elsewhere in the brain compatible with chronic small vessel disease. Electronically Signed   By: Genevie Ann M.D.   On: 04/24/2019 19:35    ____________________________________________   PROCEDURES  Procedure(s) performed:   Procedures   ____________________________________________   INITIAL IMPRESSION / ASSESSMENT AND PLAN / ED COURSE  Pertinent labs &  imaging results that were available during my care of the patient were reviewed by me and considered in my medical decision making (see chart for details).   Patient presents emergency department with electrical shooting pain in the legs giving some shaking into the  arms.  He does have speech disturbance which seems most consistent with stuttering type speech.  Discussed the case with Dr. Malen Gauze who recommends MRI brain and reassess.  Labs reviewed with no acute findings.  Care transferred to Dr. Sabra Heck pending MRI brain.   ____________________________________________  FINAL CLINICAL IMPRESSION(S) / ED DIAGNOSES  Final diagnoses:  Acute ischemic stroke ([place])     MEDICATIONS GIVEN DURING THIS VISIT:  Medications  allopurinol (ZYLOPRIM) tablet 50 mg (has no administration in time range)  traMADol (ULTRAM) tablet 50 mg (has no administration in time range)  diltiazem (CARDIZEM CD) 24 hr capsule 360 mg (has no administration in time range)  metolazone (ZAROXOLYN) tablet 2.5 mg (has no administration in time range)  Riociguat TABS 1 mg (1 mg Oral Not Given 04/25/19 0009)  Selexipag TABS 1,600 mcg (1,600 mcg Oral Not Given 04/25/19 0009)  spironolactone (ALDACTONE) tablet 25 mg (has no administration in time range)  torsemide (DEMADEX) tablet 80 mg (has no administration in time range)  escitalopram (LEXAPRO) tablet 10 mg (has no administration in time range)  traZODone (DESYREL) tablet 100 mg (has no administration in time range)  pantoprazole (PROTONIX) EC tablet 40 mg (has no administration in time range)  finasteride (PROSCAR) tablet 5 mg (has no administration in time range)  potassium chloride (KLOR-CON) CR tablet 10 mEq (has no administration in time range)  latanoprost (XALATAN) 0.005 % ophthalmic solution 1 drop (has no administration in time range)   stroke: mapping our early stages of recovery book (has no administration in time range)  acetaminophen (TYLENOL) tablet 650 mg (has no  administration in time range)    Or  acetaminophen (TYLENOL) 160 MG/5ML solution 650 mg (has no administration in time range)    Or  acetaminophen (TYLENOL) suppository 650 mg (has no administration in time range)  enoxaparin (LOVENOX) injection 30 mg (has no administration in time range)  aspirin suppository 300 mg (has no administration in time range)    Or  aspirin tablet 325 mg (has no administration in time range)  insulin aspart (novoLOG) injection 0-9 Units (has no administration in time range)  rOPINIRole (REQUIP) tablet 2 mg (has no administration in time range)  sodium chloride flush (NS) 0.9 % injection 3 mL (3 mLs Intravenous Given 04/24/19 2119)  traMADol (ULTRAM) tablet 50 mg (50 mg Oral Given 04/24/19 1453)  traMADol (ULTRAM) tablet 50 mg (50 mg Oral Given 04/24/19 2126)  zolpidem (AMBIEN) tablet 5 mg (5 mg Oral Given 04/24/19 2249)    Note:  This document was prepared using Dragon voice recognition software and may include unintentional dictation errors.  Nanda Quinton, MD, [facility]    Samauri Kellenberger, [facility], MD 04/25/19 [id number]

## 2019-04-24 NOTE — ED Notes (Signed)
Admitting MD notified to update wife

## 2019-04-24 NOTE — Telephone Encounter (Signed)
[employer] medicare order sent to GI. They will obtain the auth and reach out to the patient to schedule.  °

## 2019-04-24 NOTE — Telephone Encounter (Signed)
Spoke with patient to advise medication changes but patient c/o of leg spasms to the point of full body "convulsions". Patients stated his speech is altered. I noticed a change in patients speech today from yesterday. Patient was having a harder time than normal stuttering and getting his words out.  Patient said he did not sleep at all last night due to uncontrolled leg spasms and was concerned. Patient stated he spoke with neurology yesterday and they were going to order an EEG and CT of the brain but he feels he has is considerably worse than yesterday. I advised patient to go to the emergency room to be evaluated if he felt he was symptoms were getting worse.

## 2019-04-25 ENCOUNTER — Inpatient Hospital Stay (HOSPITAL_COMMUNITY): Payer: Medicare HMO

## 2019-04-25 DIAGNOSIS — N183 Chronic kidney disease, stage 3 unspecified: Secondary | ICD-10-CM

## 2019-04-25 DIAGNOSIS — I639 Cerebral infarction, unspecified: Secondary | ICD-10-CM

## 2019-04-25 DIAGNOSIS — I10 Essential (primary) hypertension: Secondary | ICD-10-CM

## 2019-04-25 DIAGNOSIS — I6389 Other cerebral infarction: Secondary | ICD-10-CM

## 2019-04-25 DIAGNOSIS — R569 Unspecified convulsions: Secondary | ICD-10-CM

## 2019-04-25 DIAGNOSIS — C8580 Other specified types of non-Hodgkin lymphoma, unspecified site: Secondary | ICD-10-CM

## 2019-04-25 DIAGNOSIS — I5033 Acute on chronic diastolic (congestive) heart failure: Secondary | ICD-10-CM

## 2019-04-25 DIAGNOSIS — I272 Pulmonary hypertension, unspecified: Secondary | ICD-10-CM

## 2019-04-25 LAB — COMPREHENSIVE METABOLIC PANEL
ALT: 22 U/L (ref 0–44)
AST: 19 U/L (ref 15–41)
Albumin: 3.3 g/dL — ABNORMAL LOW (ref 3.5–5.0)
Alkaline Phosphatase: 83 U/L (ref 38–126)
Anion gap: 10 (ref 5–15)
BUN: 46 mg/dL — ABNORMAL HIGH (ref 8–23)
CO2: 30 mmol/L (ref 22–32)
Calcium: 8.2 mg/dL — ABNORMAL LOW (ref 8.9–10.3)
Chloride: 94 mmol/L — ABNORMAL LOW (ref 98–111)
Creatinine, Ser: 1.76 mg/dL — ABNORMAL HIGH (ref 0.61–1.24)
GFR calc Af Amer: 40 mL/min — ABNORMAL LOW (ref >60)
GFR calc non Af Amer: 34 mL/min — ABNORMAL LOW (ref >60)
Glucose, Bld: 76 mg/dL (ref 70–99)
Potassium: 3.8 mmol/L (ref 3.5–5.1)
Sodium: 134 mmol/L — ABNORMAL LOW (ref 135–145)
Total Bilirubin: 0.8 mg/dL (ref 0.3–1.2)
Total Protein: 6 g/dL — ABNORMAL LOW (ref 6.5–8.1)

## 2019-04-25 LAB — CBC
HCT: 36.8 % — ABNORMAL LOW (ref 39.0–52.0)
Hemoglobin: 11.6 g/dL — ABNORMAL LOW (ref 13.0–17.0)
MCH: 28.6 pg (ref 26.0–34.0)
MCHC: 31.5 g/dL (ref 30.0–36.0)
MCV: 90.9 fL (ref 80.0–100.0)
Platelets: 284 10*3/uL (ref 150–400)
RBC: 4.05 MIL/uL — ABNORMAL LOW (ref 4.22–5.81)
RDW: 15.4 % (ref 11.5–15.5)
WBC: 7.8 10*3/uL (ref 4.0–10.5)
nRBC: 0 % (ref 0.0–0.2)

## 2019-04-25 LAB — MAGNESIUM
Magnesium: 2.5 mg/dL — ABNORMAL HIGH (ref 1.7–2.4)
Magnesium: 2.6 mg/dL — ABNORMAL HIGH (ref 1.7–2.4)

## 2019-04-25 LAB — GLUCOSE, CAPILLARY
Glucose-Capillary: 73 mg/dL (ref 70–99)
Glucose-Capillary: 79 mg/dL (ref 70–99)
Glucose-Capillary: 121 mg/dL — ABNORMAL HIGH (ref 70–99)
Glucose-Capillary: 90 mg/dL (ref 70–99)
Glucose-Capillary: 134 mg/dL — ABNORMAL HIGH (ref 70–99)

## 2019-04-25 LAB — BASIC METABOLIC PANEL
Anion gap: 13 (ref 5–15)
BUN: 50 mg/dL — ABNORMAL HIGH (ref 8–23)
CO2: 29 mmol/L (ref 22–32)
Calcium: 8.5 mg/dL — ABNORMAL LOW (ref 8.9–10.3)
Chloride: 92 mmol/L — ABNORMAL LOW (ref 98–111)
Creatinine, Ser: 2.02 mg/dL — ABNORMAL HIGH (ref 0.61–1.24)
GFR calc Af Amer: 34 mL/min — ABNORMAL LOW (ref >60)
GFR calc non Af Amer: 29 mL/min — ABNORMAL LOW (ref >60)
Glucose, Bld: 93 mg/dL (ref 70–99)
Potassium: 4.5 mmol/L (ref 3.5–5.1)
Sodium: 134 mmol/L — ABNORMAL LOW (ref 135–145)

## 2019-04-25 LAB — LIPID PANEL
Cholesterol: 164 mg/dL (ref 0–200)
HDL: 35 mg/dL — ABNORMAL LOW (ref >40)
LDL Cholesterol: 103 mg/dL — ABNORMAL HIGH (ref 0–99)
Total CHOL/HDL Ratio: 4.7 RATIO
Triglycerides: 132 mg/dL (ref <150)
VLDL: 26 mg/dL (ref 0–40)

## 2019-04-25 LAB — HEMOGLOBIN A1C
Hgb A1c MFr Bld: 5.9 % — ABNORMAL HIGH (ref 4.8–5.6)
Mean Plasma Glucose: 122.63 mg/dL

## 2019-04-25 LAB — ECHOCARDIOGRAM COMPLETE
Height: 67 in
Weight: 3044.11 oz

## 2019-04-25 LAB — CREATININE, SERUM
Creatinine, Ser: 1.84 mg/dL — ABNORMAL HIGH (ref 0.61–1.24)
GFR calc Af Amer: 38 mL/min — ABNORMAL LOW (ref >60)
GFR calc non Af Amer: 33 mL/min — ABNORMAL LOW (ref >60)

## 2019-04-25 LAB — SARS CORONAVIRUS 2 (TAT 6-24 HRS): SARS Coronavirus 2: NEGATIVE

## 2019-04-25 LAB — MRSA PCR SCREENING: MRSA by PCR: NEGATIVE

## 2019-04-25 MED ORDER — ATORVASTATIN CALCIUM 40 MG PO TABS
40.0000 mg | ORAL_TABLET | Freq: Every day | ORAL | Status: DC
  Administered 2019-04-25 – 2019-04-26 (×2): 40 mg via ORAL
  Filled 2019-04-25 (×3): qty 1

## 2019-04-25 MED ORDER — ROPINIROLE HCL 1 MG PO TABS
2.0000 mg | ORAL_TABLET | Freq: Three times a day (TID) | ORAL | Status: DC
  Administered 2019-04-25 – 2019-04-27 (×7): 2 mg via ORAL
  Filled 2019-04-25 (×9): qty 2

## 2019-04-25 MED ORDER — FUROSEMIDE 10 MG/ML IJ SOLN
80.0000 mg | Freq: Two times a day (BID) | INTRAMUSCULAR | Status: DC
  Administered 2019-04-25: 80 mg via INTRAVENOUS
  Filled 2019-04-25: qty 8

## 2019-04-25 MED ORDER — POTASSIUM CHLORIDE CRYS ER 20 MEQ PO TBCR
20.0000 meq | EXTENDED_RELEASE_TABLET | Freq: Once | ORAL | Status: AC
  Administered 2019-04-25: 20 meq via ORAL
  Filled 2019-04-25: qty 1

## 2019-04-25 MED ORDER — LORAZEPAM 1 MG PO TABS
2.0000 mg | ORAL_TABLET | Freq: Once | ORAL | Status: AC
  Administered 2019-04-25: 2 mg via ORAL
  Filled 2019-04-25: qty 2

## 2019-04-25 MED ORDER — RIOCIGUAT 1.5 MG PO TABS
1.5000 mg | ORAL_TABLET | Freq: Three times a day (TID) | ORAL | Status: DC
  Administered 2019-04-25: 1.5 via ORAL
  Administered 2019-04-25 – 2019-04-27 (×4): 1.5 mg via ORAL
  Filled 2019-04-25 (×6): qty 1

## 2019-04-25 NOTE — Progress Notes (Signed)
EEG complete - results pending.

## 2019-04-25 NOTE — Progress Notes (Signed)
Noted to stutter more when he tries to talk fast and has to make self calm down and slow down then he is fine.  Does get upset because is not allowed to get up on his own and go to the bathroom but was reminded that he is a fall risk because he is unsteady and also because he is on large doses of diuretics and that can cause his BP to be lower and he needs to have someone with him to get up.His pitting edema to him feet has slightly lessened since he got the diuretics

## 2019-04-25 NOTE — Procedures (Signed)
Patient Name: Ryan [name]  MRN: [id number]  Epilepsy Attending: Lora Havens  Referring Physician/Provider: Dr. Rosalin Hawking Date: 04/25/2019 Duration: 27.57 minutes  Patient history: 86 year old male with intermittent stuttering speech associated with body shaking/leg jerking movements.  EEG to evaluate for seizures.  Level of alertness: Awake, drowsy  AEDs during EEG study: None  Technical aspects: This EEG study was done with scalp electrodes positioned according to the 10-20 International system of electrode placement. Electrical activity was acquired at a sampling rate of _0  and reviewed with a high frequency filter of _1  and a low frequency filter of _2 . EEG data were recorded continuously and digitally stored.   Description: The posterior dominant rhythm consists of 9 Hz activity of moderate voltage (25-35 uV) seen predominantly in posterior head regions, symmetric and reactive to eye opening and eye closing. Drowsiness was characterized by attenuation of the posterior background rhythm.  Single sharp transient was seen in right temporal region. Physiologic photic driving was seen during photic stimulation.  Hyperventilation was not performed.    Single EKG strip showed arrhythmia and frequent PVCs.  IMPRESSION: This study is within normal limits. No seizures or epileptiform discharges were seen throughout the recording. However, only wakefulness and drowsiness were recorded. If suspicion for interictal activity remains a concern, a prolonged study including sleep should be considered.   Melania Kirks Barbra Sarks

## 2019-04-25 NOTE — ED Notes (Signed)
Pt resting comfortably at this time. Lights dimmed for comfort. No complaints or requests at this time.

## 2019-04-25 NOTE — Progress Notes (Signed)
Carotid artery duplex completed. Refer to "CV Proc" under chart review to view preliminary results.  04/25/2019 3:37 PM Kelby Aline., MHA, RVT, RDCS, RDMS

## 2019-04-25 NOTE — Telephone Encounter (Signed)
Note patient admitted for acute CVA.

## 2019-04-25 NOTE — ED Notes (Signed)
Pt ambulating to RR with one assist. Steady gait, NAD noted

## 2019-04-25 NOTE — Progress Notes (Signed)
STROKE TEAM PROGRESS NOTE   INTERVAL HISTORY Wife at bedside. Pt and his wife recounted HPI with me. Pt have several issues for the last week also. He and his wife not live together, but calling each other daily. For the last week, pt stuttering on the phone calls. Also, for the last 4-5 days, pt legs and feet swelling, especially feet swelling much worse than before and he was imbalance with falls. Last Friday, PT/OT worked with him at home found him to have myoclonus in all his extremities. Pt mimic the myoclonus to me, which was random and asynchronized jerking of all extremities. Pt stated that he can not control those. He stated that he had long standing LBP. Last night, he was lying in bed, his back hurts and he also started to have myoclonus and stuttering, he was then sit up at side of bed with help from RN, at certain position he was good without pain and his "jerking, shaking and stuttering" were gone. As per MRI tech, pt before gets into MRI, he "shoke" a lot. Pt denies any LOC, confusion or HA, tongue bitting or b/b dysfunction.   Pt has RLS, legs numbness and tingling, as well as leg cramping for a while at least several months. Followed with Dr. Jannifer Franklin 03/2019 and had L-spine MRI showed DJD.   Vitals:   04/25/19 0134 04/25/19 0353 04/25/19 0832 04/25/19 1154  BP:  (!) 102/57 111/64 99/66  Pulse:  85 (!) 57 (!) 55  Resp:  _0 Temp:  (!) 97.3 F (36.3 C) 98.2 F (36.8 C) 98 F (36.7 C)  TempSrc:  Oral Oral Oral  SpO2:  95% 97% 98%  Weight: 86.3 kg     Height: _1  (1.702 m)       CBC:  Recent Labs  Lab 04/24/19 1024 04/25/19 0003  WBC 8.2 7.8  HGB 12.0* 11.6*  HCT 38.2* 36.8*  MCV 91.0 90.9  PLT 288 935    Basic Metabolic Panel:  Recent Labs  Lab 04/24/19 1024 04/24/19 1024 04/25/19 0003 04/25/19 0428  NA 134*  --   --  134*  K 4.6  --   --  3.8  CL 93*  --   --  94*  CO2 28  --   --  30  GLUCOSE 93  --   --  76  BUN 53*  --   --  46*  CREATININE 2.01*    < > 1.84* 1.76*  CALCIUM 8.5*  --   --  8.2*  MG  --   --   --  2.5*   < > = values in this interval not displayed.   Lipid Panel:     Component Value Date/Time   CHOL 164 04/25/2019 0428   TRIG 132 04/25/2019 0428   TRIG 76 11/23/2005 0911   HDL 35 (L) 04/25/2019 0428   CHOLHDL 4.7 04/25/2019 0428   VLDL 26 04/25/2019 0428   LDLCALC 103 (H) 04/25/2019 0428   HgbA1c:  Lab Results  Component Value Date   HGBA1C 5.9 (H) 04/25/2019   Urine Drug Screen: No results found for: LABOPIA, COCAINSCRNUR, LABBENZ, AMPHETMU, THCU, LABBARB  Alcohol Level No results found for: ETH  IMAGING past 24 hours MR BRAIN WO CONTRAST  Result Date: 04/24/2019 CLINICAL DATA:  86 year old male with increasing bilateral lower extremity swelling. Stuttering, lower extremity spasms. EXAM: MRI HEAD WITHOUT CONTRAST TECHNIQUE: Multiplanar, multiecho pulse sequences of the brain and surrounding structures were obtained  without intravenous contrast. COMPARISON:  Head CT 09/23/2018. FINDINGS: Brain: Punctate area of abnormal diffusion in the right occipital lobe white matter near the occipital horn on series 4, image 19. Associated mild T2 hyperintensity. No other restricted diffusion or evidence of acute infarction. No chronic cerebral blood products or definite cortical encephalomalacia identified. Axial FLAIR images are degraded by artifact with scattered generally mild for age T2 and FLAIR hyperintensity. T2 heterogeneity in the bilateral deep gray nuclei appears to be combination of perivascular spaces and occasional small chronic lacunar infarcts. Negative brainstem and cerebellum. No midline shift, mass effect, evidence of mass lesion, ventriculomegaly, extra-axial collection or acute intracranial hemorrhage. Cervicomedullary junction and pituitary are within normal limits. The examination had to be discontinued prior to completion due to excessive patient motion. No coronal T2 imaging was obtained. Vascular: Major  intracranial vascular flow voids are preserved. Dominant and dolichoectatic left vertebral artery with underlying generalized intracranial arterial tortuosity. Skull and upper cervical spine: Negative visible cervical spine. Normal bone marrow signal. Sinuses/Orbits: Negative, postoperative changes to both globes. Other: Mastoids are clear. Visible internal auditory structures appear normal. Scalp and face soft tissues appear negative. IMPRESSION: 1. Punctate diffusion abnormality in the right occipital lobe white matter compatible with a tiny acute infarct. However, clinical significance is unclear. There is no associated hemorrhage or mass effect. 2. No other acute intracranial abnormality identified. Generally mild for age signal changes elsewhere in the brain compatible with chronic small vessel disease. Electronically Signed   By: Genevie Ann M.D.   On: 04/24/2019 19:35   ECHOCARDIOGRAM COMPLETE  Result Date: 04/25/2019    ECHOCARDIOGRAM REPORT   Patient Name:   Ryan [name] Date of Exam: 04/25/2019 Medical Rec #:  [id number]       Height:       67.0 in Accession #:    [id number]      Weight:       190.3 lb Date of Birth:  July 15, 1931      BSA:          1.980 m Patient Age:    86 years        BP:           111/64 mmHg Patient Gender: M               HR:           84 bpm. Exam Location:  Inpatient Procedure: 2D Echo, Cardiac Doppler and Color Doppler Indications:    Stroke 434.91 / I163.9  History:        Patient has prior history of Echocardiogram examinations, most                 recent 07/20/2018. CHF, COPD; Risk Factors:Hypertension,                 Diabetes, Dyslipidemia and GERD. Edema. Lymphoma.  Sonographer:    Jonelle Sidle Dance Referring Phys: [place]  1. Left ventricular ejection fraction, by estimation, is 60 to 65%. The left ventricle has normal function. The left ventricle has no regional wall motion abnormalities. There is severe left ventricular hypertrophy. Left  ventricular diastolic parameters  were normal.  2. Right ventricular systolic function is normal. The right ventricular size is normal. There is normal pulmonary artery systolic pressure.  3. Left atrial size was moderately dilated.  4. The mitral valve is normal in structure. No evidence of mitral valve regurgitation. No evidence of mitral stenosis.  5. The aortic  valve is tricuspid. Aortic valve regurgitation is trivial. No aortic stenosis is present.  6. The inferior vena cava is normal in size with greater than 50% respiratory variability, suggesting right atrial pressure of 3 mmHg. FINDINGS  Left Ventricle: Left ventricular ejection fraction, by estimation, is 60 to 65%. The left ventricle has normal function. The left ventricle has no regional wall motion abnormalities. The left ventricular internal cavity size was normal in size. There is  severe left ventricular hypertrophy. Left ventricular diastolic parameters were normal. Right Ventricle: The right ventricular size is normal. No increase in right ventricular wall thickness. Right ventricular systolic function is normal. There is normal pulmonary artery systolic pressure. The tricuspid regurgitant velocity is 2.51 m/s, and  with an assumed right atrial pressure of 3 mmHg, the estimated right ventricular systolic pressure is 50.3 mmHg. Left Atrium: Left atrial size was moderately dilated. Right Atrium: Right atrial size was normal in size. Pericardium: There is no evidence of pericardial effusion. Mitral Valve: The mitral valve is normal in structure. Normal mobility of the mitral valve leaflets. No evidence of mitral valve regurgitation. No evidence of mitral valve stenosis. Tricuspid Valve: The tricuspid valve is normal in structure. Tricuspid valve regurgitation is not demonstrated. No evidence of tricuspid stenosis. Aortic Valve: The aortic valve is tricuspid. . There is mild thickening and mild calcification of the aortic valve. Aortic valve  regurgitation is trivial. No aortic stenosis is present. There is mild thickening of the aortic valve. There is mild calcification of the aortic valve. Pulmonic Valve: The pulmonic valve was normal in structure. Pulmonic valve regurgitation is mild. No evidence of pulmonic stenosis. Aorta: The aortic root is normal in size and structure. Venous: The inferior vena cava is normal in size with greater than 50% respiratory variability, suggesting right atrial pressure of 3 mmHg. IAS/Shunts: No atrial level shunt detected by color flow Doppler.  LEFT VENTRICLE PLAX 2D LVIDd:         3.15 cm LVIDs:         2.24 cm LV PW:         0.97 cm LV IVS:        1.65 cm LVOT diam:     2.20 cm LV SV:         103 LV SV Index:   52 LVOT Area:     3.80 cm  RIGHT VENTRICLE             IVC RV Basal diam:  2.65 cm     IVC diam: 1.94 cm RV S prime:     20.30 cm/s TAPSE (M-mode): 1.9 cm LEFT ATRIUM             Index       RIGHT ATRIUM           Index LA diam:        4.40 cm 2.22 cm/m  RA Area:     14.80 cm LA Vol (A2C):   65.8 ml 33.24 ml/m RA Volume:   33.80 ml  17.07 ml/m LA Vol (A4C):   48.5 ml 24.50 ml/m LA Biplane Vol: 57.5 ml 29.04 ml/m  AORTIC VALVE LVOT Vmax:   156.00 cm/s LVOT Vmean:  97.700 cm/s LVOT VTI:    0.270 m  AORTA Ao Root diam: 3.40 cm Ao Asc diam:  3.30 cm MITRAL VALVE               TRICUSPID VALVE MV Area (PHT): 2.54 cm    TR  Peak grad:   25.2 mmHg MV Decel Time: 299 msec    TR Vmax:        251.00 cm/s MV E velocity: 81.00 cm/s MV A velocity: 76.00 cm/s  SHUNTS MV E/A ratio:  1.07        Systemic VTI:  0.27 m                            Systemic Diam: 2.20 cm Jenkins Rouge MD Electronically signed by Jenkins Rouge MD Signature Date/Time: 04/25/2019/10:52:16 AM    Final     PHYSICAL EXAM  Temp:  [97.3 F (36.3 C)-98.2 F (36.8 C)] 98 F (36.7 C) (03/24 1154) Pulse Rate:  [55-146] 71 (03/24 1606) Resp:  [12-22] 18 (03/24 1606) BP: (97-120)/(46-72) 100/55 (03/24 1606) SpO2:  [90 %-99 %] 96 % (03/24  1606) Weight:  [86.3 kg] 86.3 kg (03/24 0134)  General - Well nourished, well developed, in no apparent distress.  Ophthalmologic - fundi not visualized due to noncooperation.  Cardiovascular - Regular rhythm and rate.  Mental Status -  Level of arousal and orientation to time, place, and person were intact. Language including expression, naming, repetition, comprehension was assessed and found intact. Fund of Knowledge was assessed and was intact.  Cranial Nerves II - XII - II - Visual field intact OU. III, IV, VI - Extraocular movements intact. V - Facial sensation intact bilaterally. VII - Facial movement intact bilaterally. VIII - Hearing & vestibular intact bilaterally. X - Palate elevates symmetrically. XI - Chin turning & shoulder shrug intact bilaterally. XII - Tongue protrusion intact.  Motor Strength - The patient's strength was normal in all extremities and pronator drift was absent. BLE swelling distal > proximal. Bulk was normal and fasciculations were absent.   Motor Tone - Muscle tone was assessed at the neck and appendages and was normal.  Reflexes - The patient's reflexes were symmetrical in all extremities and he had no pathological reflexes.  Sensory - Light touch, temperature/pinprick were assessed and were symmetrical. However, decreased light touch from knee down bilaterally, R>L   Coordination - The patient had normal movements in the hands with no ataxia or dysmetria.  Tremor was absent.  Gait and Station - deferred.   ASSESSMENT/PLAN [name] is a 86 y.o. male with history of CHF, COPD,non-Hodgkin's lymphoma pelvic mass status post radiation therapy diabetes mellitus type 2 chronic kidney disease stage IV presenting with Swollen legs, low back pain and restless legs.   Myoclonus and stuttering likely due to anxiety and stress  random and asynchronized jerking of all extremities.  Symptoms happen in the setting of discomfort, upset, back  pain, severe RLS, with anxiety  EEG neg  Recommend relaxation, de-stress and cope with anxiety  Continue to follow up with Dr. Jannifer [facility] at [facility] for RLS, radiculopathy  Stroke:   Incidental punctate R occipital infarct likely d/t small vessel disease, infarct is not the etiology of his presenting symptoms   MRI  Punctate R occipital lobe white matter infarct. Small vessel disease.   MRA  pending  Carotid Doppler  Right ICA 40-59% stenosis  2D Echo EF 60-65%. No source of embolus   Loop recorder is being considered by cardiology due to severe LVH and diastolic CHF  EEG no sz during wake and drowsy states. Did not include sleep.  LDL 103  HgbA1c 5.9  Lovenox 30 mg sq daily for VTE prophylaxis  No antithrombotic prior  to admission, now on aspirin 325 mg daily.   Therapy recommendations:  pending   Disposition:  pending   Leg swelling  Worsening as per wife  LE venous doppler pending  Hyperlipidemia  Home meds:  No statin  LDL 103, goal < 70  Added lipitor 40   Continue statin at discharge  Diabetes type II Controlled  HgbA1c 5.9, goal < 7.0  CBGs  SSI  PCP follow up  Other Stroke Risk Factors  Advanced age  Former Cigarette smoker  ETOH use, advised to drink no more than 2 drink(s) a day  Obesity, Body mass index is 29.8 kg/m., recommend weight loss, diet and exercise as appropriate   Family hx stroke (other)  Chronic diastolic Congestive heart failure  Other Active Problems  Non-Hodgkin's Lymphoma   Pulmonary HTN  CKD stage 3  Mild normocytic anemia   Abnormal Movements of LE - ? RLS - Followed by Dr. Jannifer Franklin as an OP - On ropinirole   Hospital day # 1  Rosalin Hawking, MD PhD Stroke Neurology 04/25/2019 6:05 PM    To contact Stroke Continuity provider, please refer to [email or web address] After hours, contact General Neurology

## 2019-04-25 NOTE — Progress Notes (Signed)
PT Cancellation Note  Patient Details Name: Ryan [name] MRN: [id number] DOB: [birth year]   Cancelled Treatment:    Reason Eval/Treat Not Completed: Patient at procedure or test/unavailable - will check back as schedule allows.  [place] Pager [phone]  Office [phone]    Roxine Caddy D Elonda Husky 04/25/2019, 9:58 AM

## 2019-04-25 NOTE — Evaluation (Signed)
Physical Therapy Evaluation Patient Details Name: Ryan [name] MRN: [id number] DOB: 1932/01/24 Today's Date: 04/25/2019   History of Present Illness  86 yo male admitted with intermittent stuttering speech associated body shaking/ leg jerking movements. MRI (+) punctate occipital lobe infarct PMH CHF COPD non hodgkin's lymphoma pelvic mass DM   Clinical Impression   Pt presents with functional LE weakness, WNL visual fields upon assessment, LE pitting edema, impaired standing balance, dyspnea on exertion, and decreased activity tolerance. Pt to benefit from acute PT to address deficits. Pt ambulated hallway distance with use of RW and cuing for form and safety, mild dyspnea post-ambulation but sats WFL. No neurologic weakness, numbness/tingling/pins/needles, spasms, or stuttering noted today. Pt states at home, he gets very fatigued when he mobilizes at home and feels as if he needs a day or two to physically recover from fatigue. PT recommending continue with HHPT, and ADL assist as needed. PT to progress mobility as tolerated, and will continue to follow acutely.      Follow Up Recommendations Home health PT;Supervision for mobility/OOB    Equipment Recommendations  None recommended by PT    Recommendations for Other Services       Precautions / Restrictions Precautions Precautions: Fall Restrictions Weight Bearing Restrictions: No      Mobility  Bed Mobility               General bed mobility comments: pt sitting EOB upon PT arrival, at home sleeps in a lift chair  Transfers Overall transfer level: Needs assistance Equipment used: Rolling walker (2 wheeled) Transfers: Sit to/from Stand Sit to Stand: Min guard         General transfer comment: Min guard for safety, increased time to rise.  Ambulation/Gait Ambulation/Gait assistance: Min guard Gait Distance (Feet): 220 Feet Assistive device: Rolling walker (2 wheeled) Gait Pattern/deviations: Step-through  pattern;Decreased stride length Gait velocity: decr   General Gait Details: Min guard for safety, x1 verbal cuing for object navigation in hallway as pt bumped object with RW, and x2 VC for placement in RW during gait. DOE 2/4 immediately post-ambulation, SpO2 94% on 2Lo2 and HR maintained in 80s during mobility.  Stairs            Wheelchair Mobility    Modified Rankin (Stroke Patients Only) Modified Rankin (Stroke Patients Only) Pre-Morbid Rankin Score: Moderate disability Modified Rankin: Moderately severe disability     Balance Overall balance assessment: Needs assistance(pt reports "no falls, but I did stumble once and my PT called it a fall") Sitting-balance support: No upper extremity supported;Feet supported Sitting balance-Leahy Scale: Good     Standing balance support: Bilateral upper extremity supported Standing balance-Leahy Scale: Fair Standing balance comment: able to remove hands from RW in static standing, reliant on external support during gait                             Pertinent Vitals/Pain Pain Assessment: No/denies pain    Home Living Family/patient expects to be discharged to:: Other (Comment)(independent apartment [facility] (3 hours checks for safety) Living Arrangements: Alone   Type of Home: Independent living facility Home Access: Level entry       Home Equipment: Walker - 4 wheels;Grab bars - toilet;Grab bars - tub/shower Additional Comments: electric cart ( scooter), sleeps in lift chair     Prior Function Level of Independence: Independent with assistive device(s)   Gait / Transfers Assistance Needed: ambulating with Rollator  ADL's / Homemaking Assistance Needed: assist with dressing feet  Comments: facility provided wellness checks every 3 hours even in independent living. pt cooks his own meals and completes his own baths     Hand Dominance   Dominant Hand: Right    Extremity/Trunk Assessment   Upper  Extremity Assessment Upper Extremity Assessment: Overall WFL for tasks assessed    Lower Extremity Assessment Lower Extremity Assessment: RLE deficits/detail;LLE deficits/detail RLE Deficits / Details: MMT: 3+/5 hip flexion; 4/5 hip abd/add, knee extension, knee flexion; WFL DF/PF. Functional weakness noted in early LE fatigue during mobility, difficulty with hip flexion when attempting to adjust socks RLE Sensation: history of peripheral neuropathy RLE Coordination: WNL LLE Deficits / Details: MMT: 3+/5 hip flexion; 4/5 hip abd/add, knee extension, knee flexion; [facility] DF/PF. Functional weakness noted in early LE fatigue during mobility, difficulty with hip flexion when attempting to adjust socks LLE Sensation: history of peripheral neuropathy LLE Coordination: WNL    Cervical / Trunk Assessment Cervical / Trunk Assessment: Kyphotic  Communication   Communication: No difficulties  Cognition Arousal/Alertness: Awake/alert Behavior During Therapy: WFL for tasks assessed/performed Overall Cognitive Status: Impaired/Different from baseline Area of Impairment: Memory;Problem solving                     Memory: Decreased short-term memory       Problem Solving: Slow processing;Requires verbal cues;Requires tactile cues General Comments: Pt requires repeated cuing at times for PT subjective questions, and at times requires PT rephrasing. Pt is talkative and tangential, requires cuing to stay on task. Unsure how much of this is baseline.      General Comments General comments (skin integrity, edema, etc.): Pitting edema LEs 2+ bilaterally, visual fields WNL bilaterally. Heel to shin limited by pt LE functional weakness    Exercises     Assessment/Plan    PT Assessment Patient needs continued PT services  PT Problem List Decreased strength;Decreased mobility;Decreased safety awareness;Decreased activity tolerance;Decreased balance;Decreased knowledge of use of DME;Impaired  sensation;Cardiopulmonary status limiting activity       PT Treatment Interventions DME instruction;Therapeutic activities;Gait training;Therapeutic exercise;Patient/family education;Balance training;Functional mobility training;Neuromuscular re-education    PT Goals (Current goals can be found in the Care Plan section)  Acute Rehab PT Goals Patient Stated Goal: to return to [facility]. wife present and reports appears much closer to baseline PT Goal Formulation: With patient Time For Goal Achievement: 05/09/19 Potential to Achieve Goals: Good    Frequency Min 3X/week   Barriers to discharge        Co-evaluation               AM-PAC PT "6 Clicks" Mobility  Outcome Measure Help needed turning from your back to your side while in a flat bed without using bedrails?: A Little Help needed moving from lying on your back to sitting on the side of a flat bed without using bedrails?: A Little Help needed moving to and from a bed to a chair (including a wheelchair)?: None Help needed standing up from a chair using your arms (e.g., wheelchair or bedside chair)?: None Help needed to walk in hospital room?: A Little Help needed climbing 3-5 steps with a railing? : A Little 6 Click Score: 20    End of Session Equipment Utilized During Treatment: Gait belt;Oxygen(wears 2LO2 at baseline) Activity Tolerance: Patient tolerated treatment well Patient left: in chair;with call bell/phone within reach;with family/visitor present;Other (comment)(OT in room) Nurse Communication: Mobility status PT Visit Diagnosis: Other abnormalities  of gait and mobility (R26.89);Other symptoms and signs involving the nervous system (R29.898);Muscle weakness (generalized) (M62.81)    Time: 7072-1711 PT Time Calculation (min) (ACUTE ONLY): 35 min   Charges:   PT Evaluation $PT Eval Low Complexity: 1 Low PT Treatments $Gait Training: 8-22 mins       Elan Mcelvain E, PT Acute Rehabilitation Services Pager  [phone]  Office [phone]  Zada Haser D Elonda Husky 04/25/2019, 3:42 PM

## 2019-04-25 NOTE — Evaluation (Signed)
Occupational Therapy Evaluation Patient Details Name: Ryan [name] MRN: [id number] DOB: 1931/08/02 Today's Date: 04/25/2019    History of Present Illness 86 yo male admitted with intermittent stuttering speech associated body shaking/ leg jerking movements. MRI (+) punctate occipital lobe infarct PMH CHF COPD non hodgkin's lymphoma pelvic mass DM    Clinical Impression   Patient evaluated by Occupational Therapy with no further acute OT needs identified. All education has been completed and the patient has no further questions. See below for any follow-up Occupational Therapy or equipment needs. OT to sign off. Thank you for referral.      Follow Up Recommendations  Home health OT    Equipment Recommendations  None recommended by OT    Recommendations for Other Services       Precautions / Restrictions Precautions Precautions: Fall      Mobility Bed Mobility               General bed mobility comments: oob in chair on arrival  Transfers Overall transfer level: Modified independent               General transfer comment: pt able to stand with cue without (A) to have alarm pad placemed    Balance                                           ADL either performed or assessed with clinical judgement   ADL Overall ADL's : Needs assistance/impaired Eating/Feeding: Modified independent   Grooming: Wash/dry face                 Lower Body Dressing Details (indicate cue type and reason): at baseline will call CNAs at times to help with LB socks.                General ADL Comments: pt completed x4 visual assessments with errors with print at 12 font and determining which letter is underlined. the patient is able to read the letter correctly but not correct in which letters are underlined. pt is able to scan L to R without errors. pt is able to scan L to R to complete cancellations. pt with decrease cognitive speed with 3 step  command and pt doing self checks on answers before answering. pt verbalized the last time was harder and more challenging.      Vision Baseline Vision/History: Wears glasses;Macular Degeneration Wears Glasses: At all times Vision Assessment?: Yes Additional Comments: pt reports not seeing eye doctor in 2 years     Perception     Praxis      Pertinent Vitals/Pain Pain Assessment: No/denies pain     Hand Dominance Right   Extremity/Trunk Assessment Upper Extremity Assessment Upper Extremity Assessment: Overall WFL for tasks assessed   Lower Extremity Assessment Lower Extremity Assessment: Defer to PT evaluation   Cervical / Trunk Assessment Cervical / Trunk Assessment: Kyphotic   Communication Communication Communication: No difficulties   Cognition Arousal/Alertness: Awake/alert Behavior During Therapy: WFL for tasks assessed/performed Overall Cognitive Status: Impaired/Different from baseline Area of Impairment: Memory;Problem solving                     Memory: Decreased short-term memory       Problem Solving: Slow processing General Comments: pt asking repetitive questions at times. wife states "i understand what you mean." pt at the end of  session thanks therapist for "being persistent in evaluation and helping him. I can appreciate that"   General Comments       Exercises     Shoulder Instructions      Home Living Family/patient expects to be discharged to:: Other (Comment)(independent apartment [facility] (3 hours checks for safety) Living Arrangements: Alone   Type of Home: Independent living facility Home Access: Level entry           Bathroom Shower/Tub: Walk-in shower   Bathroom Toilet: Handicapped height     Home Equipment: Environmental consultant - 4 wheels   Additional Comments: electric cart ( scooter), sleeps in lift chair       Prior Functioning/Environment Level of Independence: Independent with assistive device(s)  Gait / Transfers  Assistance Needed: ambulating with Rollator ADL's / Homemaking Assistance Needed: assist with dressing feet   Comments: facility provided wellness checks every 3 hours even in independent living. pt cooks his own meals and completes his own baths        OT Problem List: Decreased strength;Decreased activity tolerance;Impaired balance (sitting and/or standing)      OT Treatment/Interventions:      OT Goals(Current goals can be found in the care plan section) Acute Rehab OT Goals Patient Stated Goal: to return to [facility]. wife present and reports appears much closer to baseline Potential to Achieve Goals: Good  OT Frequency:     Barriers to D/C:            Co-evaluation              AM-PAC OT "6 Clicks" Daily Activity     Outcome Measure Help from another person eating meals?: None Help from another person taking care of personal grooming?: None Help from another person toileting, which includes using toliet, bedpan, or urinal?: A Little Help from another person bathing (including washing, rinsing, drying)?: A Little Help from another person to put on and taking off regular upper body clothing?: None Help from another person to put on and taking off regular lower body clothing?: A Little 6 Click Score: 21   End of Session Equipment Utilized During Treatment: Rolling walker;Oxygen Nurse Communication: Mobility status;Precautions  Activity Tolerance: Patient tolerated treatment well Patient left: in chair;with call bell/phone within reach;with chair alarm set;with family/visitor present  OT Visit Diagnosis: Unsteadiness on feet (R26.81)                Time: 1329-1400 OT Time Calculation (min): 31 min Charges:  OT General Charges $OT Visit: 1 Visit OT Evaluation $OT Eval Moderate Complexity: 1 Mod   Ryan [name], OTR/L  Acute Rehabilitation Services Pager: [phone] Office: [phone] .   Jeri Modena 04/25/2019, 2:48 PM

## 2019-04-25 NOTE — Consult Note (Signed)
Requesting Physician: Dr. Hal Hope    Chief Complaint: Swollen legs, low back pain and restless legs  History obtained from: Patient and Chart   HPI:                                                                                                                                       Ryan [name] is a 86 y.o. male with CHF, COPD,non-Hodgkin's lymphoma pelvic mass status post radiation therapy diabetes mellitus type 2 chronic kidney disease stage IV presents to increasing abnormal movements of his legs/whole body jerking and stuttering speech.   Patient states that since the last 10 days he has been having intermittent stuttering speech usually associated with body shaking/leg jerking movements.  He feels symptoms are brought about by discomfort, occurs mostly in the day.  He states that previously these were controlled with pain medications but they are no longer working.  He also complains of paresthesias and cramps in shoulders and legs.  Was seen by Dr. Jannifer Franklin in February of this year for the symptoms who felt there were not consistent with restless legs and order MRI of the L-spine.  MRI L-spine does show progressive alkalosis from L1-L5 with multifocal foraminal stenosis but no cord compression.  Also over the last few days family noticed that his legs were becoming more swollen and decided to bring him to the emergency department.  Because of his starting speech and MRI brain was performed in the ED which showed a punctate occipital lobe infarction patient was admitted for further evaluation of stroke.    Past Medical History:  Diagnosis Date  . Asthma   . Barrett's esophagus   . Benign prostatic hypertrophy   . Bilateral lower extremity edema   . CHF (congestive heart failure) ([place])   . Chronic fatigue   . COPD (chronic obstructive pulmonary disease) ([place])   . Diabetes mellitus type 2 in nonobese ([place]) 04/24/2019  . Diverticulosis of colon (without mention of hemorrhage)   .  Esophageal reflux   . Gastritis   . Gluten intolerance   . Hiatal hernia 02/10/11   Noted on CT Scan - Moderate Hiatal Hernia  . Hyperlipemia   . Hypertension   . Lymphoma of lymph nodes in pelvis ([place]) 03/03/2011    Large Right Retroperitoneal Mass  . Melanoma ([place])    lymphoma  . Microcytic anemia 04/20/2011   . 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 Osteoarthritis    hands/feet,knees, NECK, BACK  . Periaortic lymphadenopathy 02/16/2011  . Personal history of colonic polyps 2004   hyperplastic Dr. Collene Mares  . Pulmonary hyperinflation   . Renal cyst 02/10/11    Noted on CT Scan - Bilateral Renal Cysts  . S/P radiation therapy 03/15/11 - 04/09/11   Abdominal/ Pelvic Tumor, 3600 cGy/20 Fractions    Past Surgical History:  Procedure Laterality Date  .  ARTHROSCOPIC REPAIR ACL     right  . BONE MARROW ASPIRATION  02/25/11   Bone Marrow, Aspirate, Clot, and Bilateral Bx, Right PIC  . CATARACT EXTRACTION, BILATERAL    . EYE SURGERY    . HERNIA REPAIR     LEFT INGUINAL   . INSERTION OF MESH N/A 08/23/2012   Procedure: INSERTION OF MESH;  Surgeon: Madilyn Hook, DO;  Location: [facility];  Service: General;  Laterality: N/A;  . LUMBAR LAMINECTOMY/DECOMPRESSION MICRODISCECTOMY Right 12/31/2013   Procedure: RIGHT L4-5 L5-S1 LAMINECTOMY;  Surgeon: Kristeen Miss, MD;  Location: [facility];  Service: Neurosurgery;  Laterality: Right;  RIGHT L4-5 L5-S1 LAMINECTOMY  . RIGHT HEART CATH N/A 07/25/2018   Procedure: RIGHT HEART CATH;  Surgeon: Larey Dresser, MD;  Location: [facility];  Service: Cardiovascular;  Laterality: N/A;  . ROTATOR CUFF REPAIR     right  . TONSILLECTOMY    . TONSILLECTOMY    . VENTRAL HERNIA REPAIR N/A 08/23/2012   Procedure: HERNIA REPAIR VENTRAL ADULT;  Surgeon: Madilyn Hook, DO;  Location: [facility];  Service: General;  Laterality: N/A;    Family History  Problem Relation Age of Onset  . Heart disease Father         MI 6  . Urticaria Father   . Prostate cancer Brother   . Prostate cancer Paternal Uncle   . Prostate cancer Paternal Uncle   . Prostate cancer Paternal Uncle   . Hyperlipidemia Other   . Stroke Other   . Hypertension Other   . ADD / ADHD Other   . Colon cancer Neg Hx   . Allergic rhinitis Neg Hx   . Asthma Neg Hx   . Eczema Neg Hx    Social History:  reports that he quit smoking about 27 years ago. His smoking use included pipe and cigarettes. He has a 35.00 pack-year smoking history. He has never used smokeless tobacco. He reports current alcohol use. He reports that he does not use drugs.  Allergies:  Allergies  Allergen Reactions  . Pollen Extract-Tree Extract Other (See Comments)    HEADACHES, TIRED , DRAINAGE FROM SINUSES  . Molds & Smuts Other (See Comments)    Also dust mites causes sinus infections, h/a etc.    Medications:                                                                                                                        I reviewed home medications   ROS:  14 systems reviewed and negative except above   Examination:                                                                                                      General: Appears well-developed  Psych: Affect appropriate to situation Eyes: No scleral injection HENT: No OP obstrucion Head: Normocephalic.  Cardiovascular: Normal rate and regular rhythm.  Respiratory: Effort normal and breath sounds normal to anterior ascultation GI: Soft.  No distension. There is no tenderness.  Skin: WDI    Neurological Examination Mental Status: Alert, oriented, thought content appropriate.  Speech fluent without evidence of aphasia. Able to follow 3 step commands without difficulty. Cranial Nerves: II: Visual fields grossly normal,  III,IV, VI: ptosis not present,  extra-ocular motions intact bilaterally, pupils equal, round, reactive to light and accommodation V,VII: smile symmetric, facial light touch sensation normal bilaterally VIII: hearing normal bilaterally IX,X: uvula rises symmetrically XI: bilateral shoulder shrug XII: midline tongue extension Motor: Right : Upper extremity   5/5    Left:     Upper extremity   5/5  Lower extremity   5/5     Lower extremity   5/5 Tone and bulk:normal tone throughout; no atrophy noted Sensory: Pinprick and light touch intact throughout, bilaterally Deep Tendon Reflexes: 1+ and symmetric b/l patella Plantars: Right: downgoing   Left: downgoing Cerebellar: normal finger-to-nose, normal rapid alternating movements and normal heel-to-shin test Gait: normal gait and station     Lab Results: Basic Metabolic Panel: Recent Labs  Lab 04/24/19 1024 04/25/19 0003  NA 134*  --   K 4.6  --   CL 93*  --   CO2 28  --   GLUCOSE 93  --   BUN 53*  --   CREATININE 2.01* 1.84*  CALCIUM 8.5*  --     CBC: Recent Labs  Lab 04/24/19 1024 04/25/19 0003  WBC 8.2 7.8  HGB 12.0* 11.6*  HCT 38.2* 36.8*  MCV 91.0 90.9  PLT 288 284    Coagulation Studies: No results for input(s): LABPROT, INR in the last 72 hours.  Imaging: DG Chest 2 View  Result Date: 04/24/2019 CLINICAL DATA:  Lower extremity edema. EXAM: CHEST - 2 VIEW COMPARISON:  09/20/2018 FINDINGS: The heart is mildly enlarged but stable. Stable tortuosity of the thoracic aorta. Stable large hiatal hernia containing a good portion of the stomach. There is overlying vascular crowding and atelectasis. Streaky right basilar atelectasis is also noted. No definite infiltrates or effusions. No pulmonary edema. IMPRESSION: 1. Stable cardiac enlargement and large hiatal hernia. 2. Bibasilar atelectasis. Electronically Signed   By: Marijo Sanes M.D.   On: 04/24/2019 10:44   MR BRAIN WO CONTRAST  Result Date: 04/24/2019 CLINICAL DATA:  86 year old male with  increasing bilateral lower extremity swelling. Stuttering, lower extremity spasms. EXAM: MRI HEAD WITHOUT CONTRAST TECHNIQUE: Multiplanar, multiecho pulse sequences of the brain and surrounding structures were obtained without intravenous contrast. COMPARISON:  Head CT 09/23/2018. FINDINGS: Brain: Punctate area of abnormal diffusion in the right occipital lobe white matter near the occipital horn on  series 4, image 19. Associated mild T2 hyperintensity. No other restricted diffusion or evidence of acute infarction. No chronic cerebral blood products or definite cortical encephalomalacia identified. Axial FLAIR images are degraded by artifact with scattered generally mild for age T2 and FLAIR hyperintensity. T2 heterogeneity in the bilateral deep gray nuclei appears to be combination of perivascular spaces and occasional small chronic lacunar infarcts. Negative brainstem and cerebellum. No midline shift, mass effect, evidence of mass lesion, ventriculomegaly, extra-axial collection or acute intracranial hemorrhage. Cervicomedullary junction and pituitary are within normal limits. The examination had to be discontinued prior to completion due to excessive patient motion. No coronal T2 imaging was obtained. Vascular: Major intracranial vascular flow voids are preserved. Dominant and dolichoectatic left vertebral artery with underlying generalized intracranial arterial tortuosity. Skull and upper cervical spine: Negative visible cervical spine. Normal bone marrow signal. Sinuses/Orbits: Negative, postoperative changes to both globes. Other: Mastoids are clear. Visible internal auditory structures appear normal. Scalp and face soft tissues appear negative. IMPRESSION: 1. Punctate diffusion abnormality in the right occipital lobe white matter compatible with a tiny acute infarct. However, clinical significance is unclear. There is no associated hemorrhage or mass effect. 2. No other acute intracranial abnormality  identified. Generally mild for age signal changes elsewhere in the brain compatible with chronic small vessel disease. Electronically Signed   By: Genevie Ann M.D.   On: 04/24/2019 19:35     ASSESSMENT AND PLAN    86 y.o. male with CHF, COPD,non-Hodgkin's lymphoma pelvic mass status post radiation therapy diabetes mellitus type 2 chronic kidney disease stage IV with stuttering speech, intermittent shaking of legs, pins and needle sensation as well as  body cramps.  All MRI brain does show a small occipital lobe infarction this is incidental and unrelated to any of the symptoms.   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   You can look them up on [email or web address]  Password [facility]  Ahijah Devery [facility] Pager Number [phone]

## 2019-04-25 NOTE — Consult Note (Addendum)
Advanced Heart Failure Team Consult Note   Primary Physician: Roetta Sessions, NP PCP-Cardiologist:  [name], MD  [facility]: Dr. Aundra Dubin   Reason for Consultation: chronic diastolic heart failure and pulmonary hypertension   HPI:    Ryan [name] is seen today for evaluation of chronic diastolic heart failure and pulmonary hypertension at the request of Dr. Karleen Hampshire, Internal Medicine.   86 y.o. with history of interstitial lung disease (?NSIP vs UIP) on home oxygen, pulmonary hypertension, diastolic CHF, and prior non-Hodgkins lymphoma (pelvic mass, s/p XRT). Patient has history of reportedly mild ILD (NSIP vs UIP) followed by a pulmonologist at [facility] and pulmonary hypertension thought to be out of proportion lung disease followed by a physician at [facility]. Last CT chest was in 11/19, showing stable ILD (?UIP vs NSIP). He had RHC in 2/20 with moderate PAH, PVR 5.1 WU. He was tried on Adcirca but developed cognitive problems and worsening of peripheral edema so this was stopped. He has been on home oxygen, uses at night and with exertion.   He was admitted in 6/20 with worsening dyspnea and peripheral edema, weight up despite compliance with torsemide 40 mg bid at home. He has CKD stage 3 and torsemide was decreased in the spring. He was diuresed aggressively with fall in weight.  Echo showed normal LV size and systolic function, RV reportedly normal.  6/20 RHC showed normal filling pressures with moderate pulmonary hypertension, PVR 5.35 WU.   After discharge from hospital, patient started on ambrisentan.  His weight steadily began to increase and he developed worsening lower extremity edema, we stopped ambrisentan.   Patient was admitted in 8/20 with AKI after taking metolazone for several days.  He was admitted again in 8/20 with acute on chronic diastolic CHF. He was diuresed.  Wandering atrial pacemaker noted.   He presented to the [facility] on 3/23 w/ complaints of  stuttering and increased abnormal movements of lower extremity. Also w/ increased LEE. No respiratory difficulty. Labs were normal. MRI of brain performed and showed diffuse abnormality in the rt occipital area concerning for new CVA. Neurology consulted. Placed on high dose ASA and high intensity statin. LDL 103. Hgb A1c 5.9. Tele shows NSR w/ frequent PACS and PVCs. No afib/flutter detected.  Carotid dopplers pending. 2D echo completed today and shows normal LVEF 60-65%, severe LVH and normal RV.  His home HF and pulmonary HTN meds were resumed on admit. No respiratory difficulty. Has chronic bilateral LEE, R>L. He thinks this is slightly worse than usual baseline. He has not been able to tolerate compression stockings/ unna boots due to discomfort.     Review of Systems: [y] = yes, _0  = no   . General: Weight gain _1 ; Weight loss _2 ; Anorexia _3 ; Fatigue _4 ; Fever _5 ; Chills _6 ; Weakness _7   . Cardiac: Chest pain/pressure _8 ; Resting SOB _9 ; Exertional SOB _10 ; Orthopnea _11 ; Pedal Edema _12 ; Palpitations _13 ; Syncope _14 ; Presyncope _15 ; Paroxysmal nocturnal dyspnea_16   . Pulmonary: Cough _17 ; Wheezing_18 ; Hemoptysis_19 ; Sputum _20 ; Snoring _21   . GI: Vomiting_22 ; Dysphagia_23 ; Melena_24 ; Hematochezia _25 ; Heartburn_26 ; Abdominal pain _27 ; Constipation _28 ; Diarrhea _29 ; BRBPR _30   . GU: Hematuria_31 ; Dysuria _32 ; Nocturia_33   . Vascular: Pain in legs with walking _34 ; Pain in feet with lying flat _35 ; Non-healing sores _36 ;  Stroke _0 ; TIA _1 ; Slurred speech _2 ;  . Neuro: Headaches_3 ; Vertigo_4 ; Seizures_5 ; Paresthesias_6 ;Blurred vision _7 ; Diplopia _8 ; Vision changes _9   . Ortho/Skin: Arthritis _10 ; Joint pain _11 ; Muscle pain _12 ; Joint swelling _13 ; Back Pain _14 ; Rash _15   . Psych: Depression_16 ; Anxiety_17   . Heme: Bleeding problems _18 ; Clotting disorders _19 ; Anemia _20   . Endocrine: Diabetes _21 ; Thyroid dysfunction_22   Home Medications Prior to Admission  medications   Medication Sig Start Date End Date Taking? Authorizing Provider  potassium chloride SA (KLOR-CON) 20 MEQ tablet Take 1 tablet by mouth in the morning, at noon, in the evening, and at bedtime. 04/22/18  Yes [provider]  Riociguat (ADEMPAS) 1.5 MG TABS Take 1.5 mg by mouth 3 (three) times daily.   Yes [provider]  rOPINIRole (REQUIP) 2 MG tablet Take 1 tablet (2 mg total) by mouth in the morning, at noon, and at bedtime. 03/20/19  Yes Kathrynn Ducking, MD  Selexipag 1600 MCG TABS Take 1,600 mcg by mouth 2 (two) times daily.   Yes [provider]  allopurinol (ZYLOPRIM) 100 MG tablet Take 50 mg by mouth daily. 12/13/17   [provider]  diazepam (VALIUM) 5 MG tablet Take 5 mg by mouth 2 (two) times daily as needed for anxiety.  10/30/18   [provider]  diltiazem (CARDIZEM CD) 360 MG 24 hr capsule Take 1 capsule (360 mg total) by mouth daily. 10/04/18   Larey Dresser, MD  escitalopram (LEXAPRO) 10 MG tablet Take 10 mg by mouth daily.    [provider]  finasteride (PROSCAR) 5 MG tablet Take 1 tablet (5 mg total) by mouth daily. Patient taking differently: Take 5 mg by mouth at bedtime.  12/25/12   Norins, Heinz Knuckles, MD  FLONASE SENSIMIST 27.5 MCG/SPRAY nasal spray Place 1 spray into the nose daily as needed for rhinitis or allergies.  12/11/18   [provider]  glipiZIDE (GLUCOTROL) 5 MG tablet Take 0.5 tablets (2.5 mg total) by mouth daily before breakfast. 09/27/18 03/20/19  Amin, Jeanella Flattery, MD  ipratropium (ATROVENT) 0.03 % nasal spray Place 2 sprays into both nostrils 2 (two) times daily. 02/05/19   [provider]  latanoprost (XALATAN) 0.005 % ophthalmic solution Place 1 drop into both eyes at bedtime.  01/20/18   [provider]  methocarbamol (ROBAXIN) 500 MG tablet Take 500 mg by mouth 3 (three) times daily.    [provider]  metolazone (ZAROXOLYN) 2.5 MG tablet Take 1 tablet  (2.5 mg total) by mouth once a week. Every Wednesday 04/10/19   Larey Dresser, MD  omeprazole (PRILOSEC) 20 MG capsule Take 20 mg by mouth daily.    [provider]  Potassium Chloride ER 20 MEQ TBCR Take 10 mEq by mouth daily. 10/04/18   Larey Dresser, MD  spironolactone (ALDACTONE) 25 MG tablet Take 1 tablet (25 mg total) by mouth daily. 09/27/18 03/20/19  Amin, Jeanella Flattery, MD  torsemide (DEMADEX) 20 MG tablet Take 4 tablets (80 mg total) by mouth 2 (two) times daily. 04/11/19 05/11/19  Larey Dresser, MD  traMADol (ULTRAM) 50 MG tablet Take 50 mg by mouth every 6 (six) hours as needed for moderate pain.     [provider]  traZODone (DESYREL) 100 MG tablet Take 100 mg by mouth at bedtime. 02/12/19   [provider]  Past Medical History: Past Medical History:  Diagnosis Date  . Asthma   . Barrett's esophagus   . Benign prostatic hypertrophy   . Bilateral lower extremity edema   . CHF (congestive heart failure) ([place])   . Chronic fatigue   . COPD (chronic obstructive pulmonary disease) ([place])   . Diabetes mellitus type 2 in nonobese ([place]) 04/24/2019  . Diverticulosis of colon (without mention of hemorrhage)   . Esophageal reflux   . Gastritis   . Gluten intolerance   . Hiatal hernia 02/10/11   Noted on CT Scan - Moderate Hiatal Hernia  . Hyperlipemia   . Hypertension   . Lymphoma of lymph nodes in pelvis ([place]) 03/03/2011    Large Right Retroperitoneal Mass  . Melanoma ([place])    lymphoma  . Microcytic anemia 04/20/2011   . 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 Osteoarthritis    hands/feet,knees, NECK, BACK  . Periaortic lymphadenopathy 02/16/2011  . Personal history of colonic polyps 2004   hyperplastic Dr. Collene Mares  . Pulmonary hyperinflation   . Renal cyst 02/10/11    Noted on CT Scan - Bilateral Renal Cysts  . S/P radiation therapy 03/15/11 - 04/09/11   Abdominal/ Pelvic Tumor, 3600 cGy/20 Fractions     Past Surgical History: Past Surgical History:  Procedure Laterality Date  . ARTHROSCOPIC REPAIR ACL     right  . BONE MARROW ASPIRATION  02/25/11   Bone Marrow, Aspirate, Clot, and Bilateral Bx, Right PIC  . CATARACT EXTRACTION, BILATERAL    . EYE SURGERY    . HERNIA REPAIR     LEFT INGUINAL   . INSERTION OF MESH N/A 08/23/2012   Procedure: INSERTION OF MESH;  Surgeon: Madilyn Hook, DO;  Location: [facility];  Service: General;  Laterality: N/A;  . LUMBAR LAMINECTOMY/DECOMPRESSION MICRODISCECTOMY Right 12/31/2013   Procedure: RIGHT L4-5 L5-S1 LAMINECTOMY;  Surgeon: Kristeen Miss, MD;  Location: [facility];  Service: Neurosurgery;  Laterality: Right;  RIGHT L4-5 L5-S1 LAMINECTOMY  . RIGHT HEART CATH N/A 07/25/2018   Procedure: RIGHT HEART CATH;  Surgeon: Larey Dresser, MD;  Location: [facility];  Service: Cardiovascular;  Laterality: N/A;  . ROTATOR CUFF REPAIR     right  . TONSILLECTOMY    . TONSILLECTOMY    . VENTRAL HERNIA REPAIR N/A 08/23/2012   Procedure: HERNIA REPAIR VENTRAL ADULT;  Surgeon: Madilyn Hook, DO;  Location: [facility];  Service: General;  Laterality: N/A;    Family History: Family History  Problem Relation Age of Onset  . Heart disease Father        MI 3  . Urticaria Father   . Prostate cancer Brother   . Prostate cancer Paternal Uncle   . Prostate cancer Paternal Uncle   . Prostate cancer Paternal Uncle   . Hyperlipidemia Other   . Stroke Other   . Hypertension Other   . ADD / ADHD Other   . Colon cancer Neg Hx   . Allergic rhinitis Neg Hx   . Asthma Neg Hx   . Eczema Neg Hx     Social History: Social History   Socioeconomic History  . Marital status: Married    Spouse name: Not on file  . Number of children: 2  . Years of education: Not on file  . Highest education level: Not on file  Occupational History  . 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 Smoking status: Former Smoker    Packs/day:  1.00    Years: 35.00    Pack years: 35.00    Types: Pipe, Cigarettes    Quit date: 02/23/1992    Years since quitting: 27.1  . Smokeless tobacco: Never Used  . Tobacco comment: quit 20 years ago  Substance and Sexual Activity  . Alcohol use: Yes    Comment: 2 drinks daily scotch  AND WINE WITH SUPPER  . Drug use: No  . Sexual activity: Not on file  Other Topics Concern  . Not on file  Social History Narrative   Married - second marriage   He has two children   Retired Horticulturist, commercial Professor   Currently teaches pottery making, 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[facility]   Former Smoker quit 12 years ago- smoked for 35 years   Alcohol use- yes   Social Determinants of Radio broadcast assistant Strain:   . Difficulty of Paying Living Expenses:   Food Insecurity:   . Worried About Charity fundraiser in the Last Year:   . Arboriculturist in the Last Year:   Transportation Needs:   . Film/video editor (Medical):   Marland Kitchen Lack of Transportation (Non-Medical):   Physical Activity:   . Days of Exercise per Week:   . Minutes of Exercise per Session:   Stress:   . Feeling of Stress :   Social Connections:   . Frequency of Communication with Friends and Family:   . Frequency of Social Gatherings with Friends and Family:   . Attends Religious Services:   . Active Member of Clubs or Organizations:   . Attends Archivist Meetings:   Marland Kitchen Marital Status:     Allergies:  Allergies  Allergen Reactions  . Pollen Extract-Tree Extract Other (See Comments)    HEADACHES, TIRED , DRAINAGE FROM SINUSES  . Molds & Smuts Other (See Comments)    Also dust mites causes sinus infections, h/a etc.    Objective:    Vital Signs:   Temp:  [97.3 F (36.3 C)-98.2 F (36.8 C)] 98 F (36.7 C) (03/24 1154) Pulse Rate:  [53-146] 55 (03/24 1154) Resp:  [12-22] 18 (03/24 1154) BP: (91-120)/(46-72) 99/66 (03/24 1154) SpO2:  [90 %-99 %] 98 % (03/24 1154) Weight:  [86.3 kg] 86.3 kg (03/24  0134) Last BM Date: 04/24/19  Weight change: Filed Weights   04/24/19 1008 04/25/19 0134  Weight: 89.4 kg 86.3 kg    Intake/Output:   Intake/Output Summary (Last 24 hours) at 04/25/2019 1447 Last data filed at 04/25/2019 1335 Gross per 24 hour  Intake 150 ml  Output 750 ml  Net -600 ml      Physical Exam    General:  Well appearing moderately obese WM. No resp difficulty HEENT: normal Neck: supple. JVP to jawline . Carotids 2+ bilat; no bruits. No lymphadenopathy or thyromegaly appreciated. Cor: PMI nondisplaced. Regular rate & rhythm. No rubs, gallops or murmurs. Lungs: clear Abdomen: obese, soft, nontender, nondistended. No hepatosplenomegaly. No bruits or masses. Good bowel sounds. Extremities: no cyanosis, clubbing, rash, chronic 2+  bilateral LEE, R>Ledema w/ bilateral chronic venous stasis dermatitis  Neuro: alert & orientedx3, cranial nerves grossly intact. moves all 4 extremities w/o difficulty. Affect pleasant   Telemetry   NSR w/ frequent PVCs, PACs   EKG    No EKG this admit   Labs   Basic Metabolic Panel: Recent Labs  Lab 04/24/19 1024 04/25/19  0003 04/25/19 0428  NA 134*  --  134*  K 4.6  --  3.8  CL 93*  --  94*  CO2 28  --  30  GLUCOSE 93  --  76  BUN 53*  --  46*  CREATININE 2.01* 1.84* 1.76*  CALCIUM 8.5*  --  8.2*  MG  --   --  2.5*    Liver Function Tests: Recent Labs  Lab 04/25/19 0428  AST 19  ALT 22  ALKPHOS 83  BILITOT 0.8  PROT 6.0*  ALBUMIN 3.3*   No results for input(s): LIPASE, AMYLASE in the last 168 hours. No results for input(s): AMMONIA in the last 168 hours.  CBC: Recent Labs  Lab 04/24/19 1024 04/25/19 0003  WBC 8.2 7.8  HGB 12.0* 11.6*  HCT 38.2* 36.8*  MCV 91.0 90.9  PLT 288 284    Cardiac Enzymes: No results for input(s): CKTOTAL, CKMB, CKMBINDEX, TROPONINI in the last 168 hours.  BNP: BNP (last 3 results) Recent Labs    10/17/18 1420 12/19/18 1529 03/15/19 1206  BNP 77.6 36.2 58.8     ProBNP (last 3 results) No results for input(s): PROBNP in the last 8760 hours.   CBG: Recent Labs  Lab 04/25/19 0612 04/25/19 0834 04/25/19 1151  GLUCAP 73 79 121*    Coagulation Studies: No results for input(s): LABPROT, INR in the last 72 hours.   Imaging   MR BRAIN WO CONTRAST  Result Date: 04/24/2019 CLINICAL DATA:  86 year old male with increasing bilateral lower extremity swelling. Stuttering, lower extremity spasms. EXAM: MRI HEAD WITHOUT CONTRAST TECHNIQUE: Multiplanar, multiecho pulse sequences of the brain and surrounding structures were obtained without intravenous contrast. COMPARISON:  Head CT 09/23/2018. FINDINGS: Brain: Punctate area of abnormal diffusion in the right occipital lobe white matter near the occipital horn on series 4, image 19. Associated mild T2 hyperintensity. No other restricted diffusion or evidence of acute infarction. No chronic cerebral blood products or definite cortical encephalomalacia identified. Axial FLAIR images are degraded by artifact with scattered generally mild for age T2 and FLAIR hyperintensity. T2 heterogeneity in the bilateral deep gray nuclei appears to be combination of perivascular spaces and occasional small chronic lacunar infarcts. Negative brainstem and cerebellum. No midline shift, mass effect, evidence of mass lesion, ventriculomegaly, extra-axial collection or acute intracranial hemorrhage. Cervicomedullary junction and pituitary are within normal limits. The examination had to be discontinued prior to completion due to excessive patient motion. No coronal T2 imaging was obtained. Vascular: Major intracranial vascular flow voids are preserved. Dominant and dolichoectatic left vertebral artery with underlying generalized intracranial arterial tortuosity. Skull and upper cervical spine: Negative visible cervical spine. Normal bone marrow signal. Sinuses/Orbits: Negative, postoperative changes to both globes. Other: Mastoids are  clear. Visible internal auditory structures appear normal. Scalp and face soft tissues appear negative. IMPRESSION: 1. Punctate diffusion abnormality in the right occipital lobe white matter compatible with a tiny acute infarct. However, clinical significance is unclear. There is no associated hemorrhage or mass effect. 2. No other acute intracranial abnormality identified. Generally mild for age signal changes elsewhere in the brain compatible with chronic small vessel disease. Electronically Signed   By: Genevie Ann M.D.   On: 04/24/2019 19:35   EEG adult  Result Date: 04/25/2019 Lora Havens, MD     04/25/2019  2:07 PM Patient Name: Ryan [name] MRN: [id number] Epilepsy Attending: Lora Havens Referring Physician/Provider: Dr. Rosalin Hawking Date: 04/25/2019 Duration: 27.57 minutes Patient history: 86 year old male with  intermittent stuttering speech associated with body shaking/leg jerking movements.  EEG to evaluate for seizures. Level of alertness: Awake, drowsy AEDs during EEG study: None Technical aspects: This EEG study was done with scalp electrodes positioned according to the 10-20 International system of electrode placement. Electrical activity was acquired at a sampling rate of _0  and reviewed with a high frequency filter of _1  and a low frequency filter of _2 . EEG data were recorded continuously and digitally stored. Description: The posterior dominant rhythm consists of 9 Hz activity of moderate voltage (25-35 uV) seen predominantly in posterior head regions, symmetric and reactive to eye opening and eye closing. Drowsiness was characterized by attenuation of the posterior background rhythm.  Single sharp transient was seen in right temporal region. Physiologic photic driving was seen during photic stimulation.  Hyperventilation was not performed.  Single EKG strip showed arrhythmia and frequent PVCs. IMPRESSION: This study is within normal limits. No seizures or epileptiform discharges  were seen throughout the recording. However, only wakefulness and drowsiness were recorded. If suspicion for interictal activity remains a concern, a prolonged study including sleep should be considered. Lora Havens   ECHOCARDIOGRAM COMPLETE  Result Date: 04/25/2019    ECHOCARDIOGRAM REPORT   Patient Name:   KINNIE KAUPP Date of Exam: 04/25/2019 Medical Rec #:  [id number]       Height:       67.0 in Accession #:    [id number]      Weight:       190.3 lb Date of Birth:  1931/06/04      BSA:          1.980 m Patient Age:    47 years        BP:           111/64 mmHg Patient Gender: M               HR:           84 bpm. Exam Location:  Inpatient Procedure: 2D Echo, Cardiac Doppler and Color Doppler Indications:    Stroke 434.91 / I163.9  History:        Patient has prior history of Echocardiogram examinations, most                 recent 07/20/2018. CHF, COPD; Risk Factors:Hypertension,                 Diabetes, Dyslipidemia and GERD. Edema. Lymphoma.  Sonographer:    Jonelle Sidle Dance Referring Phys: [place]  1. Left ventricular ejection fraction, by estimation, is 60 to 65%. The left ventricle has normal function. The left ventricle has no regional wall motion abnormalities. There is severe left ventricular hypertrophy. Left ventricular diastolic parameters  were normal.  2. Right ventricular systolic function is normal. The right ventricular size is normal. There is normal pulmonary artery systolic pressure.  3. Left atrial size was moderately dilated.  4. The mitral valve is normal in structure. No evidence of mitral valve regurgitation. No evidence of mitral stenosis.  5. The aortic valve is tricuspid. Aortic valve regurgitation is trivial. No aortic stenosis is present.  6. The inferior vena cava is normal in size with greater than 50% respiratory variability, suggesting right atrial pressure of 3 mmHg. FINDINGS  Left Ventricle: Left ventricular ejection fraction, by estimation,  is 60 to 65%. The left ventricle has normal function. The left ventricle has no regional wall motion abnormalities. The left ventricular internal cavity size  was normal in size. There is  severe left ventricular hypertrophy. Left ventricular diastolic parameters were normal. Right Ventricle: The right ventricular size is normal. No increase in right ventricular wall thickness. Right ventricular systolic function is normal. There is normal pulmonary artery systolic pressure. The tricuspid regurgitant velocity is 2.51 m/s, and  with an assumed right atrial pressure of 3 mmHg, the estimated right ventricular systolic pressure is 68.3 mmHg. Left Atrium: Left atrial size was moderately dilated. Right Atrium: Right atrial size was normal in size. Pericardium: There is no evidence of pericardial effusion. Mitral Valve: The mitral valve is normal in structure. Normal mobility of the mitral valve leaflets. No evidence of mitral valve regurgitation. No evidence of mitral valve stenosis. Tricuspid Valve: The tricuspid valve is normal in structure. Tricuspid valve regurgitation is not demonstrated. No evidence of tricuspid stenosis. Aortic Valve: The aortic valve is tricuspid. . There is mild thickening and mild calcification of the aortic valve. Aortic valve regurgitation is trivial. No aortic stenosis is present. There is mild thickening of the aortic valve. There is mild calcification of the aortic valve. Pulmonic Valve: The pulmonic valve was normal in structure. Pulmonic valve regurgitation is mild. No evidence of pulmonic stenosis. Aorta: The aortic root is normal in size and structure. Venous: The inferior vena cava is normal in size with greater than 50% respiratory variability, suggesting right atrial pressure of 3 mmHg. IAS/Shunts: No atrial level shunt detected by color flow Doppler.  LEFT VENTRICLE PLAX 2D LVIDd:         3.15 cm LVIDs:         2.24 cm LV PW:         0.97 cm LV IVS:        1.65 cm LVOT diam:     2.20  cm LV SV:         103 LV SV Index:   52 LVOT Area:     3.80 cm  RIGHT VENTRICLE             IVC RV Basal diam:  2.65 cm     IVC diam: 1.94 cm RV S prime:     20.30 cm/s TAPSE (M-mode): 1.9 cm LEFT ATRIUM             Index       RIGHT ATRIUM           Index LA diam:        4.40 cm 2.22 cm/m  RA Area:     14.80 cm LA Vol (A2C):   65.8 ml 33.24 ml/m RA Volume:   33.80 ml  17.07 ml/m LA Vol (A4C):   48.5 ml 24.50 ml/m LA Biplane Vol: 57.5 ml 29.04 ml/m  AORTIC VALVE LVOT Vmax:   156.00 cm/s LVOT Vmean:  97.700 cm/s LVOT VTI:    0.270 m  AORTA Ao Root diam: 3.40 cm Ao Asc diam:  3.30 cm MITRAL VALVE               TRICUSPID VALVE MV Area (PHT): 2.54 cm    TR Peak grad:   25.2 mmHg MV Decel Time: 299 msec    TR Vmax:        251.00 cm/s MV E velocity: 81.00 cm/s MV A velocity: 76.00 cm/s  SHUNTS MV E/A ratio:  1.07        Systemic VTI:  0.27 m  Systemic Diam: 2.20 cm Jenkins Rouge MD Electronically signed by Jenkins Rouge MD Signature Date/Time: 04/25/2019/10:52:16 AM    Final       Medications:     Current Medications: . allopurinol  50 mg Oral Daily  . aspirin  300 mg Rectal Daily   Or  . aspirin  325 mg Oral Daily  . atorvastatin  40 mg Oral q1800  . diltiazem  360 mg Oral Daily  . enoxaparin (LOVENOX) injection  30 mg Subcutaneous Q24H  . escitalopram  10 mg Oral Daily  . finasteride  5 mg Oral QHS  . insulin aspart  0-9 Units Subcutaneous TID WC  . latanoprost  1 drop Both Eyes QHS  . metolazone  2.5 mg Oral Q Wed  . pantoprazole  40 mg Oral Daily  . potassium chloride  10 mEq Oral Daily  . potassium chloride  20 mEq Oral Once  . Riociguat  1.5 mg Oral TID  . rOPINIRole  2 mg Oral TID  . Selexipag  1,600 mcg Oral BID  . spironolactone  25 mg Oral Daily  . torsemide  80 mg Oral BID  . traZODone  100 mg Oral QHS     Infusions:     Assessment/Plan   1. CVA: Brain MRI 3/23 showed punctate occipital lobe infarction. Neurology following.  - on ASA 325 -  on Lipitor 40 mg qhs (LDL 103) - BP controlled.  - Hgb A1C 5.9  - bilateral carotid dopplers pending - Tele shows NSR w/ frequent PACs/PVCs. No afib/flutter detected - 2D echo shows normal LVEF w/ severe LVH. No source of embolus noted - Consider TEE and implantable loop recorder to assess for PAF  2. Chronic Diastolic HF: - 2D echo shows normal LVEF 60-65%, severe LVH and normal RV. 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  Concern for amyloid.  - Will check PYP scan to further assess - Check Multiple Myeloma panel + urine immunofixation  - he has chronic bilateral LEE on exam but no increased respiratory difficulty. Chronically NYHA II-III.  - currently on home diuretic regimen, torsemide 80 mg bid. Will change to IV diuretics for volume overload, start IV Lasix 80 mg bid.  Daily BMP to follow SCr and K. Will supp K as needed.  - recommend compression stockings to help w/ edema but he refuses. Encouraged elevation   3. Pulmonary HTN: Patient thought to have Fate out of proportion to his lung disease (ILD, NSIP versus UIP). RHC in 2/20 showed mean PA pressure 41, PVR 5.1 WU, CI 3. Echo in 6/20 with RV reportedly ok =>Echo reviewed by Dr. Aundra Dubin who felt that RV was well-visualized. Patient did not tolerate Adcirca due to edema/?cognitive effects. RHC was done again in 6/20, showed moderate pulmonary arterial hypertension with PVR 5.35 (comparable to prior).  Serological workup for PH was negative. Patient started ambrisentan 5 mg and developed significant edema/volume overload despite a high dose of torsemide.  There was concern  that ambrisentan may have been part of the reason for the peripheral edema/volume overload.  He is now on Uptravi and feels like he is doing better overall.   - he will stay off ambrisentan (?trigger for volume overload). - He did not tolerate tadalafil.  - Continue Uptravi, now up to 1600 mcg bid.  - Continue on riociguat 1.5 mg  tid   4. CKD:  - most recent SCr prior to this admit was 1.79. 2.01 on admit>>1.84 today  -  volume overload on exam. Will give IV Lasix 80 mg bid  -f/u BMP in am   Length of Stay: 1  Brittainy Simmons, PA-C  04/25/2019, 2:47 PM  Advanced Heart Failure Team Pager [phone] (M-F; 7a - 4p)  Please contact [facility] for night-coverage after hours (4p -7a ) and weekends on [email or web address]  Patient seen with PA, agree with the above note.   He was admitted with stuttering speech and restless legs (frequent involuntary movements).  He was found to have a small occipital CVA not thought to explain the symptoms he had.  No atrial fibrillation seen on monitor so far.   He reports increased LE swelling recently.  Has been taking his torsemide and metolazone q wk.  He is on his [place] meds.   General: NAD Neck: JVP 10 cm, no thyromegaly or thyroid nodule.  Lungs: Decreased at bases. CV: Nondisplaced PMI.  Heart regular S1/S2, no S3/S4, no murmur.  2+ ankle edema.   Abdomen: Soft, nontender, no hepatosplenomegaly, no distention.  Skin: Intact without lesions or rashes.  Neurologic: Alert and oriented x 3.  Psych: Normal affect. Extremities: No clubbing or cyanosis.  HEENT: Normal.   Discussed with neurology, small occipital lobe infarct does not seem to explain the stuttering and restless leg-type symptoms that he has.  Most likely has infarct from HTN.  - Atorvastatin 40 mg daily started.  - ASA 325 - Will get echo.  - Does not need TEE.  - He has had wandering atrial pacemaker noted in past, think he is at relatively high risk for atrial fibrillation.  Would recommend event monitor, will talk to EP about placing.   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  - Would hold torsemide and start Lasix 80 mg IV bid for now.  Follow I/Os. Probably back to po after a few IV doses.  - Follow creatinine closely with CKD stage 3.  - Agree with workup of  cardiac amyloidosis (LVH, diastolic CHF, peripheral neuropathy).  Will send myeloma panel, urine immunofixation, and obtain PYP scan.   Pulmonary hypertension: Continue PH meds from home.   Loralie Champagne 04/25/2019 4:47 PM

## 2019-04-25 NOTE — Progress Notes (Addendum)
PROGRESS NOTE    Ryan [name]  KNL:[id number] DOB: [birth year] DOA: 04/24/2019 PCP: Roetta Sessions, NP    Brief Narrative:  86 year old male with prior history of COPD lymphoma, s/p radiation treatment, type 2 diabetes mellitus, stage III CKD, Presents with stuttering speech for the last 7 to 10 days in addition to abnormal movements of the lower extremities.  Initial MRI shows diffuse abnormality in the right occipital area concerning for acute CVA.  Neurology was consulted and patient was admitted for evaluation of stroke.. Assessment & Plan:   Principal Problem:   Acute CVA (cerebrovascular accident) ([place]) Active Problems:   Non Hodgkin's lymphoma ([place])   Essential hypertension   Chronic diastolic CHF (congestive heart failure) (H[facility])   Pulmonary hypertension (H[facility])   CKD (chronic kidney disease) stage 3, GFR 30-59 ml/min   Diabetes mellitus type 2 in nonobese [facility])     Acute CVA of the right occipital area Admitted for evaluation of stroke. MRA of the head and neck, echocardiogram, carotid duplex ordered hemoGlobin A1c is 5.9, LDL is 103. Continue with aspirin 325 mg daily, start the patient on atorvastatin 40 mg daily. Therapy evaluations are pending.  Neurology on board and appreciate recommendations.  Chronic diastolic heart failure Continue with torsemide 80 mg twice daily, spironolactone 25 mg daily and metolazone 2.5 mg once weekly. Check strict intake and output and daily weights.  Pulmonary hypertension Resume  Adempas and Slexipag.   Abnormal movements of the lower extremities Continue with ropinirole as per neurology.   Non-Hodgkin's lymphoma S/p radiation treatment.  Recommend outpatient follow-up with oncology as scheduled.    Mild normocytic anemia Hemoglobin around 11.  Continue to monitor.   Type 2 diabetes mellitus Hemoglobin A1c is 5.9,'s continue with sliding scale insulin.  CBG (last 3)  Recent Labs    04/25/19 0612  04/25/19 0834  GLUCAP 73 79   Stage 3 CKD Creatinine appears to be at baseline.  Continue to monitor.    DVT prophylaxis: Lovenox Code Status: DNR Family Communication: None at bedside Disposition Plan:  . Patient came from: Home            . Anticipated d/c place: In 1 to 2 days pending therapy evaluation . Barriers to d/c OR conditions which need to be met to effect a safe d/c: Pending further work-up for stroke   Consultants:   Neurology  Procedures:  MRI of the brain without contrast Echocardiogram EEG Carotid duplex  Antimicrobials: None   Subjective: Pt denies any chest pain or sob.   Objective: Vitals:   04/25/19 0113 04/25/19 0134 04/25/19 0353 04/25/19 0832  BP: (!) 108/54  (!) 102/57 111/64  Pulse: 80  85 (!) 57  Resp: _0 Temp: (!) 97.5 F (36.4 C)  (!) 97.3 F (36.3 C) 98.2 F (36.8 C)  TempSrc: Oral  Oral Oral  SpO2: 90%  95% 97%  Weight:  86.3 kg    Height:  _1  (1.702 m)      Intake/Output Summary (Last 24 hours) at 04/25/2019 1114 Last data filed at 04/24/2019 2129 Gross per 24 hour  Intake --  Output 750 ml  Net -750 ml   Filed Weights   04/24/19 1008 04/25/19 0134  Weight: 89.4 kg 86.3 kg    Examination:  General exam: Appears calm and comfortable  Respiratory system: Clear to auscultation. Respiratory effort normal. Cardiovascular system: S1 & S2 heard, RRR.  No JVD Gastrointestinal system: Abdomen is nondistended, soft and  nontender. . Normal bowel sounds heard. Central nervous system: Alert and oriented to move all extremities. Extremities: No cyanosis or clubbing, bilateral lower extremity edema present Skin: No rashes, lesions or ulcers Psychiatry: Mood & affect appropriate.     Data Reviewed: I have personally reviewed following labs and imaging studies  CBC: Recent Labs  Lab 04/24/19 1024 04/25/19 0003  WBC 8.2 7.8  HGB 12.0* 11.6*  HCT 38.2* 36.8*  MCV 91.0 90.9  PLT 288 150   Basic Metabolic  Panel: Recent Labs  Lab 04/24/19 1024 04/25/19 0003 04/25/19 0428  NA 134*  --  134*  K 4.6  --  3.8  CL 93*  --  94*  CO2 28  --  30  GLUCOSE 93  --  76  BUN 53*  --  46*  CREATININE 2.01* 1.84* 1.76*  CALCIUM 8.5*  --  8.2*  MG  --   --  2.5*   GFR: Estimated Creatinine Clearance: 32.2 mL/min (A) (by C-G formula based on SCr of 1.76 mg/dL (H)). Liver Function Tests: Recent Labs  Lab 04/25/19 0428  AST 19  ALT 22  ALKPHOS 83  BILITOT 0.8  PROT 6.0*  ALBUMIN 3.3*   No results for input(s): LIPASE, AMYLASE in the last 168 hours. No results for input(s): AMMONIA in the last 168 hours. Coagulation Profile: No results for input(s): INR, PROTIME in the last 168 hours. Cardiac Enzymes: No results for input(s): CKTOTAL, CKMB, CKMBINDEX, TROPONINI in the last 168 hours. BNP (last 3 results) No results for input(s): PROBNP in the last 8760 hours. HbA1C: Recent Labs    04/25/19 0428  HGBA1C 5.9*   CBG: Recent Labs  Lab 04/25/19 0612 04/25/19 0834  GLUCAP 73 79   Lipid Profile: Recent Labs    04/25/19 0428  CHOL 164  HDL 35*  LDLCALC 103*  TRIG 132  CHOLHDL 4.7   Thyroid Function Tests: No results for input(s): TSH, T4TOTAL, FREET4, T3FREE, THYROIDAB in the last 72 hours. Anemia Panel: No results for input(s): VITAMINB12, FOLATE, FERRITIN, TIBC, IRON, RETICCTPCT in the last 72 hours. Sepsis Labs: No results for input(s): PROCALCITON, LATICACIDVEN in the last 168 hours.  Recent Results (from the past 240 hour(s))  SARS CORONAVIRUS 2 (TAT 6-24 HRS) Nasopharyngeal Nasopharyngeal Swab     Status: None   Collection Time: 04/24/19 10:54 PM   Specimen: Nasopharyngeal Swab  Result Value Ref Range Status   SARS Coronavirus 2 NEGATIVE NEGATIVE Final    Comment: (NOTE) SARS-CoV-2 target nucleic acids are NOT DETECTED. The SARS-CoV-2 RNA is generally detectable in upper and lower respiratory specimens during the acute phase of infection. Negative results do not  preclude SARS-CoV-2 infection, do not rule out co-infections with other pathogens, and should not be used as the sole basis for treatment or other patient management decisions. Negative results must be combined with clinical observations, patient history, and epidemiological information. The expected result is Negative. Fact Sheet for Patients: SugarRoll.be Fact Sheet for Healthcare Providers: https://www.woods-mathews.com/ This test is not yet approved or cleared by the [place] FDA and  has been authorized for detection and/or diagnosis of SARS-CoV-2 by FDA under an Emergency Use Authorization (EUA). This EUA will remain  in effect (meaning this test can be used) for the duration of the COVID-19 declaration under Section 56 4(b)(1) of the Act, 21 U.S.C. section 360bbb-3(b)(1), unless the authorization is terminated or revoked sooner. Performed at [facility], [place] [address], [place], [place] [address]  MRSA PCR Screening     Status: None   Collection Time: 04/25/19  2:04 AM   Specimen: Nasal Mucosa; Nasopharyngeal  Result Value Ref Range Status   MRSA by PCR NEGATIVE NEGATIVE Final    Comment:        The GeneXpert MRSA Assay (FDA approved for NASAL specimens only), is one component of a comprehensive MRSA colonization surveillance program. It is not intended to diagnose MRSA infection nor to guide or monitor treatment for MRSA infections. Performed at [facility], [place] [address] [place], [place] [address]          Radiology Studies: DG Chest 2 View  Result Date: 04/24/2019 CLINICAL DATA:  Lower extremity edema. EXAM: CHEST - 2 VIEW COMPARISON:  09/20/2018 FINDINGS: The heart is mildly enlarged but stable. Stable tortuosity of the thoracic aorta. Stable large hiatal hernia containing a good portion of the stomach. There is overlying vascular crowding and atelectasis. Streaky right basilar atelectasis is also  noted. No definite infiltrates or effusions. No pulmonary edema. IMPRESSION: 1. Stable cardiac enlargement and large hiatal hernia. 2. Bibasilar atelectasis. Electronically Signed   By: Marijo Sanes M.D.   On: 04/24/2019 10:44   MR BRAIN WO CONTRAST  Result Date: 04/24/2019 CLINICAL DATA:  86 year old male with increasing bilateral lower extremity swelling. Stuttering, lower extremity spasms. EXAM: MRI HEAD WITHOUT CONTRAST TECHNIQUE: Multiplanar, multiecho pulse sequences of the brain and surrounding structures were obtained without intravenous contrast. COMPARISON:  Head CT 09/23/2018. FINDINGS: Brain: Punctate area of abnormal diffusion in the right occipital lobe white matter near the occipital horn on series 4, image 19. Associated mild T2 hyperintensity. No other restricted diffusion or evidence of acute infarction. No chronic cerebral blood products or definite cortical encephalomalacia identified. Axial FLAIR images are degraded by artifact with scattered generally mild for age T2 and FLAIR hyperintensity. T2 heterogeneity in the bilateral deep gray nuclei appears to be combination of perivascular spaces and occasional small chronic lacunar infarcts. Negative brainstem and cerebellum. No midline shift, mass effect, evidence of mass lesion, ventriculomegaly, extra-axial collection or acute intracranial hemorrhage. Cervicomedullary junction and pituitary are within normal limits. The examination had to be discontinued prior to completion due to excessive patient motion. No coronal T2 imaging was obtained. Vascular: Major intracranial vascular flow voids are preserved. Dominant and dolichoectatic left vertebral artery with underlying generalized intracranial arterial tortuosity. Skull and upper cervical spine: Negative visible cervical spine. Normal bone marrow signal. Sinuses/Orbits: Negative, postoperative changes to both globes. Other: Mastoids are clear. Visible internal auditory structures appear  normal. Scalp and face soft tissues appear negative. IMPRESSION: 1. Punctate diffusion abnormality in the right occipital lobe white matter compatible with a tiny acute infarct. However, clinical significance is unclear. There is no associated hemorrhage or mass effect. 2. No other acute intracranial abnormality identified. Generally mild for age signal changes elsewhere in the brain compatible with chronic small vessel disease. Electronically Signed   By: Genevie Ann M.D.   On: 04/24/2019 19:35   ECHOCARDIOGRAM COMPLETE  Result Date: 04/25/2019    ECHOCARDIOGRAM REPORT   Patient Name:   Ryan [name] Date of Exam: 04/25/2019 Medical Rec #:  [id number]       Height:       67.0 in Accession #:    [id number]      Weight:       190.3 lb Date of Birth:  16-May-1931      BSA:  1.980 m Patient Age:    19 years        BP:           111/64 mmHg Patient Gender: M               HR:           84 bpm. Exam Location:  Inpatient Procedure: 2D Echo, Cardiac Doppler and Color Doppler Indications:    Stroke 434.91 / I163.9  History:        Patient has prior history of Echocardiogram examinations, most                 recent 07/20/2018. CHF, COPD; Risk Factors:Hypertension,                 Diabetes, Dyslipidemia and GERD. Edema. Lymphoma.  Sonographer:    Jonelle Sidle Dance Referring Phys: [place]  1. Left ventricular ejection fraction, by estimation, is 60 to 65%. The left ventricle has normal function. The left ventricle has no regional wall motion abnormalities. There is severe left ventricular hypertrophy. Left ventricular diastolic parameters  were normal.  2. Right ventricular systolic function is normal. The right ventricular size is normal. There is normal pulmonary artery systolic pressure.  3. Left atrial size was moderately dilated.  4. The mitral valve is normal in structure. No evidence of mitral valve regurgitation. No evidence of mitral stenosis.  5. The aortic valve is tricuspid. Aortic  valve regurgitation is trivial. No aortic stenosis is present.  6. The inferior vena cava is normal in size with greater than 50% respiratory variability, suggesting right atrial pressure of 3 mmHg. FINDINGS  Left Ventricle: Left ventricular ejection fraction, by estimation, is 60 to 65%. The left ventricle has normal function. The left ventricle has no regional wall motion abnormalities. The left ventricular internal cavity size was normal in size. There is  severe left ventricular hypertrophy. Left ventricular diastolic parameters were normal. Right Ventricle: The right ventricular size is normal. No increase in right ventricular wall thickness. Right ventricular systolic function is normal. There is normal pulmonary artery systolic pressure. The tricuspid regurgitant velocity is 2.51 m/s, and  with an assumed right atrial pressure of 3 mmHg, the estimated right ventricular systolic pressure is 73.2 mmHg. Left Atrium: Left atrial size was moderately dilated. Right Atrium: Right atrial size was normal in size. Pericardium: There is no evidence of pericardial effusion. Mitral Valve: The mitral valve is normal in structure. Normal mobility of the mitral valve leaflets. No evidence of mitral valve regurgitation. No evidence of mitral valve stenosis. Tricuspid Valve: The tricuspid valve is normal in structure. Tricuspid valve regurgitation is not demonstrated. No evidence of tricuspid stenosis. Aortic Valve: The aortic valve is tricuspid. . There is mild thickening and mild calcification of the aortic valve. Aortic valve regurgitation is trivial. No aortic stenosis is present. There is mild thickening of the aortic valve. There is mild calcification of the aortic valve. Pulmonic Valve: The pulmonic valve was normal in structure. Pulmonic valve regurgitation is mild. No evidence of pulmonic stenosis. Aorta: The aortic root is normal in size and structure. Venous: The inferior vena cava is normal in size with greater  than 50% respiratory variability, suggesting right atrial pressure of 3 mmHg. IAS/Shunts: No atrial level shunt detected by color flow Doppler.  LEFT VENTRICLE PLAX 2D LVIDd:         3.15 cm LVIDs:         2.24 cm LV PW:  0.97 cm LV IVS:        1.65 cm LVOT diam:     2.20 cm LV SV:         103 LV SV Index:   52 LVOT Area:     3.80 cm  RIGHT VENTRICLE             IVC RV Basal diam:  2.65 cm     IVC diam: 1.94 cm RV S prime:     20.30 cm/s TAPSE (M-mode): 1.9 cm LEFT ATRIUM             Index       RIGHT ATRIUM           Index LA diam:        4.40 cm 2.22 cm/m  RA Area:     14.80 cm LA Vol (A2C):   65.8 ml 33.24 ml/m RA Volume:   33.80 ml  17.07 ml/m LA Vol (A4C):   48.5 ml 24.50 ml/m LA Biplane Vol: 57.5 ml 29.04 ml/m  AORTIC VALVE LVOT Vmax:   156.00 cm/s LVOT Vmean:  97.700 cm/s LVOT VTI:    0.270 m  AORTA Ao Root diam: 3.40 cm Ao Asc diam:  3.30 cm MITRAL VALVE               TRICUSPID VALVE MV Area (PHT): 2.54 cm    TR Peak grad:   25.2 mmHg MV Decel Time: 299 msec    TR Vmax:        251.00 cm/s MV E velocity: 81.00 cm/s MV A velocity: 76.00 cm/s  SHUNTS MV E/A ratio:  1.07        Systemic VTI:  0.27 m                            Systemic Diam: 2.20 cm Jenkins Rouge MD Electronically signed by Jenkins Rouge MD Signature Date/Time: 04/25/2019/10:52:16 AM    Final         Scheduled Meds: . allopurinol  50 mg Oral Daily  . aspirin  300 mg Rectal Daily   Or  . aspirin  325 mg Oral Daily  . diltiazem  360 mg Oral Daily  . enoxaparin (LOVENOX) injection  30 mg Subcutaneous Q24H  . escitalopram  10 mg Oral Daily  . finasteride  5 mg Oral QHS  . insulin aspart  0-9 Units Subcutaneous TID WC  . latanoprost  1 drop Both Eyes QHS  . metolazone  2.5 mg Oral Q Wed  . pantoprazole  40 mg Oral Daily  . potassium chloride  10 mEq Oral Daily  . Riociguat  1.5 mg Oral TID  . rOPINIRole  2 mg Oral TID  . Selexipag  1,600 mcg Oral BID  . spironolactone  25 mg Oral Daily  . torsemide  80 mg Oral BID   . traZODone  100 mg Oral QHS   Continuous Infusions:   LOS: 1 day        Hosie Poisson, MD [facility]   To contact the attending provider between 7A-7P or the covering provider during after hours 7P-7A, please log into the web site [email or web address] and access using universal [place] password for that web site. If you do not have the password, please call the hospital operator.  04/25/2019, 11:14 AM

## 2019-04-25 NOTE — Progress Notes (Signed)
  Echocardiogram 2D Echocardiogram has been performed.  Ryan [name] 04/25/2019, 10:34 AM

## 2019-04-25 NOTE — Progress Notes (Signed)
SLP Cancellation Note  Patient Details Name: CHRISTAPHER GILLIAN MRN: [id number] DOB: 1931-09-17   Cancelled treatment:       Reason Eval/Treat Not Completed: Patient at procedure or test/unavailable   Shantice Menger, Katherene Ponto 04/25/2019, 9:27 AM

## 2019-04-26 ENCOUNTER — Inpatient Hospital Stay (HOSPITAL_COMMUNITY): Payer: Medicare HMO

## 2019-04-26 ENCOUNTER — Encounter (HOSPITAL_COMMUNITY)
Admission: EM | Disposition: A | Discharge status: Home Health | Payer: Self-pay | Source: Home / Self Care | Attending: Internal Medicine

## 2019-04-26 ENCOUNTER — Encounter (HOSPITAL_COMMUNITY): Payer: Medicare HMO

## 2019-04-26 DIAGNOSIS — F419 Anxiety disorder, unspecified: Secondary | ICD-10-CM

## 2019-04-26 DIAGNOSIS — I6389 Other cerebral infarction: Secondary | ICD-10-CM

## 2019-04-26 HISTORY — PX: LOOP RECORDER INSERTION: EP1214

## 2019-04-26 LAB — GLUCOSE, CAPILLARY
Glucose-Capillary: 81 mg/dL (ref 70–99)
Glucose-Capillary: 114 mg/dL — ABNORMAL HIGH (ref 70–99)
Glucose-Capillary: 123 mg/dL — ABNORMAL HIGH (ref 70–99)
Glucose-Capillary: 202 mg/dL — ABNORMAL HIGH (ref 70–99)

## 2019-04-26 SURGERY — LOOP RECORDER INSERTION

## 2019-04-26 MED ORDER — TECHNETIUM TC 99M PYROPHOSPHATE
18.8000 | Freq: Once | INTRAVENOUS | Status: AC | PRN
  Administered 2019-04-26: 18.8 via INTRAVENOUS
  Filled 2019-04-26: qty 19

## 2019-04-26 MED ORDER — LIDOCAINE-EPINEPHRINE 1 %-1:100000 IJ SOLN
INTRAMUSCULAR | Status: AC
  Filled 2019-04-26: qty 1

## 2019-04-26 MED ORDER — ACETAMINOPHEN 325 MG PO TABS: 325.0000 mg | ORAL_TABLET | ORAL | Status: DC | PRN

## 2019-04-26 MED ORDER — TORSEMIDE 20 MG PO TABS
80.0000 mg | ORAL_TABLET | Freq: Two times a day (BID) | ORAL | Status: DC
  Administered 2019-04-26 (×2): 80 mg via ORAL
  Filled 2019-04-26 (×3): qty 4

## 2019-04-26 MED ORDER — LIDOCAINE-EPINEPHRINE 1 %-1:100000 IJ SOLN
INTRAMUSCULAR | Status: DC | PRN
  Administered 2019-04-26: 30 mL

## 2019-04-26 MED ORDER — ONDANSETRON HCL 4 MG/2ML IJ SOLN: 4.0000 mg | Freq: Four times a day (QID) | INTRAMUSCULAR | Status: DC | PRN

## 2019-04-26 SURGICAL SUPPLY — 2 items
MONITOR REVEAL LINQ II (Prosthesis & Implant Heart) ×1 IMPLANT
PACK LOOP INSERTION (CUSTOM PROCEDURE TRAY) ×2 IMPLANT

## 2019-04-26 NOTE — Progress Notes (Addendum)
Advanced Heart Failure Rounding Note  PCP-Cardiologist: Ryan [name], [name]   Subjective:    Yesterday diuresed with IV lasix. Creatinine trending up.   Denies SOB.    Objective:   Weight Range: 91.2 kg Body mass index is 31.49 kg/m.   Vital Signs:   Temp:  [97.5 F (36.4 C)-98.7 F (37.1 C)] 98.7 F (37.1 C) (03/25 0351) Pulse Rate:  [55-76] 69 (03/25 0351) Resp:  [18-20] 19 (03/25 0351) BP: (86-111)/(54-66) 98/58 (03/25 0445) SpO2:  [92 %-98 %] 92 % (03/25 0351) Weight:  [91.2 kg] 91.2 kg (03/25 0500) Last BM Date: 04/24/19  Weight change: Filed Weights   04/24/19 1008 04/25/19 0134 04/26/19 0500  Weight: 89.4 kg 86.3 kg 91.2 kg    Intake/Output:   Intake/Output Summary (Last 24 hours) at 04/26/2019 0753 Last data filed at 04/26/2019 0415 Gross per 24 hour  Intake 150 ml  Output 800 ml  Net -650 ml      Physical Exam    General:  Sitting on the side of the bed. No resp difficulty HEENT: Normal Neck: Supple. JVP . Carotids 2+ bilat; no bruits. No lymphadenopathy or thyromegaly appreciated. Cor: PMI nondisplaced. Regular rate & rhythm. No rubs, gallops or murmurs. Lungs: Clear Abdomen: Soft, nontender, nondistended. No hepatosplenomegaly. No bruits or masses. Good bowel sounds. Extremities: No cyanosis, clubbing, rash, R and LLE trace edema.  Neuro: Alert & orientedx3, cranial nerves grossly intact. moves all 4 extremities w/o difficulty. Affect pleasant   Telemetry     EKG    n/a  Labs    CBC Recent Labs    04/24/19 1024 04/25/19 0003  WBC 8.2 7.8  HGB 12.0* 11.6*  HCT 38.2* 36.8*  MCV 91.0 90.9  PLT 288 184   Basic Metabolic Panel Recent Labs    04/25/19 0428 04/25/19 1544  NA 134* 134*  K 3.8 4.5  CL 94* 92*  CO2 30 29  GLUCOSE 76 93  BUN 46* 50*  CREATININE 1.76* 2.02*  CALCIUM 8.2* 8.5*  MG 2.5* 2.6*   Liver Function Tests Recent Labs    04/25/19 0428  AST 19  ALT 22  ALKPHOS 83  BILITOT 0.8  PROT 6.0*    ALBUMIN 3.3*   No results for input(s): LIPASE, AMYLASE in the last 72 hours. Cardiac Enzymes No results for input(s): CKTOTAL, CKMB, CKMBINDEX, TROPONINI in the last 72 hours.  BNP: BNP (last 3 results) Recent Labs    10/17/18 1420 12/19/18 1529 03/15/19 1206  BNP 77.6 36.2 58.8    ProBNP (last 3 results) No results for input(s): PROBNP in the last 8760 hours.   D-Dimer No results for input(s): DDIMER in the last 72 hours. Hemoglobin A1C Recent Labs    04/25/19 0428  HGBA1C 5.9*   Fasting Lipid Panel Recent Labs    04/25/19 0428  CHOL 164  HDL 35*  LDLCALC 103*  TRIG 132  CHOLHDL 4.7   Thyroid Function Tests No results for input(s): TSH, T4TOTAL, T3FREE, THYROIDAB in the last 72 hours.  Invalid input(s): FREET3  Other results:   Imaging    MR ANGIO HEAD WO CONTRAST  Result Date: 04/25/2019 CLINICAL DATA:  Initial evaluation for acute stroke. EXAM: MRA HEAD WITHOUT CONTRAST TECHNIQUE: Angiographic images of the Circle of Willis were obtained using MRA technique without intravenous contrast. COMPARISON:  Prior MRI from 04/24/2019. FINDINGS: ANTERIOR CIRCULATION: Examination moderately degraded by motion artifact. Visualized distal cervical segments of the internal carotid arteries are patent with fairly  symmetric antegrade flow. Petrous, cavernous, and supraclinoid ICAs patent without appreciable hemodynamically significant stenosis. ICA termini well perfused. A1 segments patent bilaterally. Normal anterior communicating artery complex. Anterior cerebral arteries patent to their distal aspects without stenosis. No M1 stenosis or occlusion. Grossly negative MCA bifurcations, although evaluation limited by motion. Distal MCA branches fairly well perfused and symmetric. POSTERIOR CIRCULATION: Visualized vertebral arteries patent to the vertebrobasilar junction without stenosis. Left vertebral artery dominant. Neither PICA well visualized. Basilar patent to its distal  aspect without stenosis. Superior cerebral arteries patent proximally. Both PCAs primarily supplied via the basilar, although a small right posterior communicating arteries noted. PCAs perfused to their distal aspects without appreciable stenosis. No obvious intracranial aneurysm on this motion degraded exam. IMPRESSION: 1. Technically limited exam due to motion artifact. 2. Negative intracranial MRA for large vessel occlusion. No proximal high-grade or correctable stenosis identified. Electronically Signed   By: Ryan [name] M.D.   On: 04/25/2019 19:10   EEG adult  Result Date: 04/25/2019 Ryan [name], [name]     04/25/2019  2:07 PM Patient Name: Ryan [name] MRN: [id number] Epilepsy Attending: Lora [name] Referring Physician/Provider: Dr. Rosalin [name] Date: 04/25/2019 Duration: 27.57 minutes Patient history: 86 year old male with intermittent stuttering speech associated with body shaking/leg jerking movements.  EEG to evaluate for seizures. Level of alertness: Awake, drowsy AEDs during EEG study: None Technical aspects: This EEG study was done with scalp electrodes positioned according to the 10-20 International system of electrode placement. Electrical activity was acquired at a sampling rate of 500Hz and reviewed with a high frequency filter of 70Hz and a low frequency filter of 1Hz. EEG data were recorded continuously and digitally stored. Description: The posterior dominant rhythm consists of 9 Hz activity of moderate voltage (25-35 uV) seen predominantly in posterior head regions, symmetric and reactive to eye opening and eye closing. Drowsiness was characterized by attenuation of the posterior background rhythm.  Single sharp transient was seen in right temporal region. Physiologic photic driving was seen during photic stimulation.  Hyperventilation was not performed.  Single EKG strip showed arrhythmia and frequent PVCs. IMPRESSION: This study is within normal limits. No seizures or  epileptiform discharges were seen throughout the recording. However, only wakefulness and drowsiness were recorded. If suspicion for interictal activity remains a concern, a prolonged study including sleep should be considered. Ryan [name]   ECHOCARDIOGRAM COMPLETE  Result Date: 04/25/2019    ECHOCARDIOGRAM REPORT   Patient Name:   GEARLD KERSTEIN Date of Exam: 04/25/2019 Medical Rec #:  [id number]       Height:       67.0 in Accession #:    [id number]      Weight:       190.3 lb Date of Birth:  11/29/1931      BSA:          1.980 m Patient Age:    64 years        BP:           111/64 mmHg Patient Gender: M               HR:           84 bpm. Exam Location:  Inpatient Procedure: 2D Echo, Cardiac Doppler and Color Doppler Indications:    Stroke 434.91 / I163.9  History:        Patient has prior history of Echocardiogram examinations, most  recent 07/20/2018. CHF, COPD; Risk Factors:Hypertension,                 Diabetes, Dyslipidemia and GERD. Edema. Lymphoma.  Sonographer:    Jonelle Sidle Dance Referring Phys: [place]  1. Left ventricular ejection fraction, by estimation, is 60 to 65%. The left ventricle has normal function. The left ventricle has no regional wall motion abnormalities. There is severe left ventricular hypertrophy. Left ventricular diastolic parameters  were normal.  2. Right ventricular systolic function is normal. The right ventricular size is normal. There is normal pulmonary artery systolic pressure.  3. Left atrial size was moderately dilated.  4. The mitral valve is normal in structure. No evidence of mitral valve regurgitation. No evidence of mitral stenosis.  5. The aortic valve is tricuspid. Aortic valve regurgitation is trivial. No aortic stenosis is present.  6. The inferior vena cava is normal in size with greater than 50% respiratory variability, suggesting right atrial pressure of 3 mmHg. FINDINGS  Left Ventricle: Left ventricular ejection  fraction, by estimation, is 60 to 65%. The left ventricle has normal function. The left ventricle has no regional wall motion abnormalities. The left ventricular internal cavity size was normal in size. There is  severe left ventricular hypertrophy. Left ventricular diastolic parameters were normal. Right Ventricle: The right ventricular size is normal. No increase in right ventricular wall thickness. Right ventricular systolic function is normal. There is normal pulmonary artery systolic pressure. The tricuspid regurgitant velocity is 2.51 m/s, and  with an assumed right atrial pressure of 3 mmHg, the estimated right ventricular systolic pressure is 72.8 mmHg. Left Atrium: Left atrial size was moderately dilated. Right Atrium: Right atrial size was normal in size. Pericardium: There is no evidence of pericardial effusion. Mitral Valve: The mitral valve is normal in structure. Normal mobility of the mitral valve leaflets. No evidence of mitral valve regurgitation. No evidence of mitral valve stenosis. Tricuspid Valve: The tricuspid valve is normal in structure. Tricuspid valve regurgitation is not demonstrated. No evidence of tricuspid stenosis. Aortic Valve: The aortic valve is tricuspid. . There is mild thickening and mild calcification of the aortic valve. Aortic valve regurgitation is trivial. No aortic stenosis is present. There is mild thickening of the aortic valve. There is mild calcification of the aortic valve. Pulmonic Valve: The pulmonic valve was normal in structure. Pulmonic valve regurgitation is mild. No evidence of pulmonic stenosis. Aorta: The aortic root is normal in size and structure. Venous: The inferior vena cava is normal in size with greater than 50% respiratory variability, suggesting right atrial pressure of 3 mmHg. IAS/Shunts: No atrial level shunt detected by color flow Doppler.  LEFT VENTRICLE PLAX 2D LVIDd:         3.15 cm LVIDs:         2.24 cm LV PW:         0.97 cm LV IVS:         1.65 cm LVOT diam:     2.20 cm LV SV:         103 LV SV Index:   52 LVOT Area:     3.80 cm  RIGHT VENTRICLE             IVC RV Basal diam:  2.65 cm     IVC diam: 1.94 cm RV S prime:     20.30 cm/s TAPSE (M-mode): 1.9 cm LEFT ATRIUM             Index  RIGHT ATRIUM           Index LA diam:        4.40 cm 2.22 cm/m  RA Area:     14.80 cm LA Vol (A2C):   65.8 ml 33.24 ml/m RA Volume:   33.80 ml  17.07 ml/m LA Vol (A4C):   48.5 ml 24.50 ml/m LA Biplane Vol: 57.5 ml 29.04 ml/m  AORTIC VALVE LVOT Vmax:   156.00 cm/s LVOT Vmean:  97.700 cm/s LVOT VTI:    0.270 m  AORTA Ao Root diam: 3.40 cm Ao Asc diam:  3.30 cm MITRAL VALVE               TRICUSPID VALVE MV Area (PHT): 2.54 cm    TR Peak grad:   25.2 mmHg MV Decel Time: 299 msec    TR Vmax:        251.00 cm/s MV E velocity: 81.00 cm/s MV A velocity: 76.00 cm/s  SHUNTS MV E/A ratio:  1.07        Systemic VTI:  0.27 m                            Systemic Diam: 2.20 cm Ryan [name] Electronically signed by Ryan [name] Signature Date/Time: 04/25/2019/10:52:16 AM    Final    VAS US CAROTID (at [facility] and [facility] only)  Result Date: 04/25/2019 Carotid Arterial Duplex Study Indications:       CVA. Comparison Study:  No prior study Performing Technologist: Maudry Mayhew MHA, RDMS, RVT, RDCS  Examination Guidelines: A complete evaluation includes B-mode imaging, spectral Doppler, color Doppler, and power Doppler as needed of all accessible portions of each vessel. Bilateral testing is considered an integral part of a complete examination. Limited examinations for reoccurring indications may be performed as noted.  Right Carotid Findings: +----------+--------+--------+--------+-----------------------+--------+           PSV cm/sEDV cm/sStenosisPlaque Description     Comments +----------+--------+--------+--------+-----------------------+--------+ CCA Prox  80      17                                               +----------+--------+--------+--------+-----------------------+--------+ CCA Distal81      16              smooth and heterogenous         +----------+--------+--------+--------+-----------------------+--------+ ICA Prox  150     50      40-59%  smooth and homogeneous          +----------+--------+--------+--------+-----------------------+--------+ ICA Distal76      17                                              +----------+--------+--------+--------+-----------------------+--------+ ECA       93      12              smooth and heterogenous         +----------+--------+--------+--------+-----------------------+--------+ +----------+--------+-------+----------------+-------------------+           PSV cm/sEDV cmsDescribe        Arm Pressure (mmHG) +----------+--------+-------+----------------+-------------------+ [id number]             Multiphasic, WNL                    +----------+--------+-------+----------------+-------------------+ +---------+--------+--------+--------------+  [place] cm/sEDV cm/sNot identified +---------+--------+--------+--------------+  Left Carotid Findings: +----------+--------+--------+--------+-----------------------+--------+           PSV cm/sEDV cm/sStenosisPlaque Description     Comments +----------+--------+--------+--------+-----------------------+--------+ CCA Prox  145     29                                              +----------+--------+--------+--------+-----------------------+--------+ CCA Distal66      18              smooth and heterogenous         +----------+--------+--------+--------+-----------------------+--------+ ICA Prox  94      20              smooth and heterogenous         +----------+--------+--------+--------+-----------------------+--------+ ICA Distal92      21                                               +----------+--------+--------+--------+-----------------------+--------+ ECA       97      10              smooth and heterogenous         +----------+--------+--------+--------+-----------------------+--------+ +----------+--------+--------+----------------+-------------------+           PSV cm/sEDV cm/sDescribe        Arm Pressure (mmHG) +----------+--------+--------+----------------+-------------------+ [id number]              Multiphasic, WNL                    +----------+--------+--------+----------------+-------------------+ +---------+--------+--+--------+-+---------+ VertebralPSV cm/s52EDV cm/s9Antegrade +---------+--------+--+--------+-+---------+   Summary: Right Carotid: Velocities in the right ICA are consistent with a 40-59%                stenosis. Left Carotid: Velocities in the left ICA are consistent with a 1-39% stenosis. Vertebrals:  Left vertebral artery demonstrates antegrade flow. Right vertebral              artery was not visualized. Subclavians: Normal flow hemodynamics were seen in bilateral subclavian              arteries. *See table(s) above for measurements and observations.  Electronically signed by Ryan [name] on 04/25/2019 at 3:59:50 PM.    Final       Medications:     Scheduled Medications: . allopurinol  50 mg Oral Daily  . aspirin  300 mg Rectal Daily   Or  . aspirin  325 mg Oral Daily  . atorvastatin  40 mg Oral q1800  . diltiazem  360 mg Oral Daily  . enoxaparin (LOVENOX) injection  30 mg Subcutaneous Q24H  . escitalopram  10 mg Oral Daily  . finasteride  5 mg Oral QHS  . furosemide  80 mg Intravenous BID  . insulin aspart  0-9 Units Subcutaneous TID WC  . latanoprost  1 drop Both Eyes QHS  . metolazone  2.5 mg Oral Q Wed  . pantoprazole  40 mg Oral Daily  . potassium chloride  10 mEq Oral Daily  . Riociguat  1.5 mg Oral TID  . rOPINIRole  2 mg Oral TID  . Selexipag  1,600 mcg Oral BID  . spironolactone  25 mg Oral  Daily  . traZODone  100 mg Oral QHS     Infusions:   PRN Medications:  acetaminophen **OR** acetaminophen (TYLENOL) oral liquid 160 mg/5 mL **OR** acetaminophen, traMADol     Assessment/Plan  1. CVA: Brain MRI 3/23 showed punctate occipital lobe infarction. Neurology following. No issues  Today. - on ASA 325 - on Lipitor 40 mg qhs (LDL 103) - Hgb A1C 5.9  - bilateral carotid dopplers pending -- 2D echo shows normal LVEF w/ severe LVH. No source of embolus noted - Consider TEE and implantable loop recorder to assess for PAF  2. Chronic Diastolic HF: - 2D echo shows normal LVEF 60-65%, severe LVH and normal RV. 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  Concern for amyloid.  - Will check PYP scan to further assess - Check Multiple Myeloma panel + urine immunofixation  - Volume status improving. Hold IV lasix.Start torsemide 80 mg twice a day.  - Renal function trending up.   3. Pulmonary HTN: Patient thought to have [place] out of proportion to his lung disease (ILD, NSIP versus UIP). RHC in 2/20 showed mean PA pressure 41, PVR 5.1 WU, CI 3. Echo in 6/20 with RV reportedly ok =>Echo reviewed by Dr. Aundra Dubin who felt that RV was well-visualized. Patient did not tolerate Adcirca due to edema/?cognitive effects. RHC was done again in 6/20, showed moderate pulmonary arterial hypertension with PVR 5.35 (comparable to prior). Serological workup for PH was negative. Patient started ambrisentan 5 mg and developed significant edema/volume overload despite a high dose of torsemide. There was concern  that ambrisentan may have been part of the reason for the peripheral edema/volume overload. He is now on Uptravi and feels like he is doing better overall.  - he will stay off ambrisentan (?trigger for volume overload). - He did not tolerate tadalafil.  - Continue Uptravi, now up to 1600 mcg bid.  - Continue on riociguat 1.5 mg tid   4. CKD:  - most  recent SCr prior to this admit was 1.79.  2.01 on admit>> 2.02  today    Loop recorder today.    Length of Stay: 2  Darrick Grinder, NP  04/26/2019, 7:53 AM  Advanced Heart Failure Team Pager [phone] (M-F; 7a - 4p)  Please contact [facility] for night-coverage after hours (4p -7a ) and weekends on [email or web address]  Patient seen with NP, agree with the above note.   He has had loop recorder insertion to monitor for AF.  Have discussed with neurology, does not need TEE.   Agree with transitioning from IV Lasix to torsemide with rising creatinine.    Hopefully home soon.   Ryan [name] 04/26/2019

## 2019-04-26 NOTE — Progress Notes (Addendum)
PROGRESS NOTE    Ryan [name]  GXQ:[id number] DOB: 06-Jul-1931 DOA: 04/24/2019 PCP: Roetta Sessions, NP    Brief Narrative:  86 year old male with prior history of COPD lymphoma, s/p radiation treatment, type 2 diabetes mellitus, stage III CKD, presents with stuttering speech for the last 7 to 10 days in addition to abnormal movements of the lower extremities.  Initial MRI shows diffuse abnormality in the right occipital area concerning for acute CVA.  Neurology was consulted and patient was admitted for evaluation of stroke. Pt seen and examined at bedside, reports feeling better than on admission and wants to know when he can go home.    Assessment & Plan:   Principal Problem:   Acute CVA (cerebrovascular accident) ([place]) Active Problems:   Non Hodgkin's lymphoma ([place])   Essential hypertension   Acute on chronic diastolic CHF (congestive heart failure) ([place])   Pulmonary hypertension, unspecified (HCC)   CKD (chronic kidney disease) stage 3, GFR 30-59 ml/min   Diabetes mellitus type 2 in nonobese (HCC)     Acute punctuate infarcts in the right occipital area territory :  Admitted for evaluation of stroke. MRA of the head and neck does not show any significant stenosis.  Echocardiogram showed Left ventricular ejection fraction, by estimation, is 60 to 65% without any regional wall abnormalities. Left ventricular diastolic parameters were normal.   Carotid duplex ordered showed  right ICA  consistent with a 40-59% stenosis.  Left ICA is 1 to 39% stenosis.  A1c is 5.9, LDL is 103. Continue with aspirin 325 mg daily, started the patient on atorvastatin 40 mg daily. Therapy evaluations are pending.  Neurology on board and appreciate recommendations.  EEG does not show any epileptiform activity during wake and drowsy states.  Plan for loop recorder placement today and venous duplex of the lower extremities are pending.  ? TEE by cardiology.   Chronic diastolic heart  failure Slightly fluid overloaded with worsening pedal edema, he was started on IV lasix yesterday and he diuresed about 2.2 lit since admission, transitioned to oral torsemide 80 mg twice daily, spironolactone 25 mg daily and metolazone 2.5 mg once weekly. Check strict intake and output and daily weights. Pt at baseline on 2lit of [place] oxygen and denies any sob or respiratory issues.   Pulmonary hypertension Resume  Adempas and Slexipag.   Abnormal movements of the lower extremities, with myoclonus:  Differential include RLS vs ?anxiety induced .  Did not have any episodes in the last 24 hours. Continue with ropinirole as per neurology and outpatient follow up.    Non-Hodgkin's lymphoma S/p radiation treatment.  Recommend outpatient follow-up with oncology as scheduled.    Mild normocytic anemia Hemoglobin around 11.  Continue to monitor.   Type 2 diabetes mellitus Hemoglobin A1c is 5.9,'s continue with sliding scale insulin.  CBG (last 3)  Recent Labs    04/25/19 2103 04/26/19 0647 04/26/19 1142  GLUCAP 134* 81 114*   Stage 3 CKD Slight worsening of creatinine from 1.7 to 2 probably from IV diuresis.  Continue to monitor.   Obesity: Body mass index is 28.96 kg/m.    DVT prophylaxis: Lovenox Code Status: DNR Family Communication: None at bedside, discussed with wife on 04/25/19 Disposition Plan:   Patient came from: Home             Anticipated d/c place: In 1 to 2 days pending therapy evaluation  Barriers to d/c OR conditions which need to be met to effect a safe  d/c: Pending further work-up for stroke   Consultants:   Neurology  Procedures:  MRI of the brain without contrast Echocardiogram EEG Carotid duplex Loop recorder placement.    Antimicrobials: None   Subjective: No new complaints , pt wants to go home after the loop recorder.   Objective: Vitals:   04/26/19 0500 04/26/19 0755 04/26/19 0827 04/26/19 1140  BP:  (!) 97/51  (!) 104/58    Pulse:  75  67  Resp:  18  16  Temp:  98.2 F (36.8 C)  98.3 F (36.8 C)  TempSrc:  Oral  Oral  SpO2:  94%  95%  Weight: 91.2 kg  83.9 kg   Height:        Intake/Output Summary (Last 24 hours) at 04/26/2019 1403 Last data filed at 04/26/2019 0837 Gross per 24 hour  Intake --  Output 1150 ml  Net -1150 ml   Filed Weights   04/25/19 0134 04/26/19 0500 04/26/19 0827  Weight: 86.3 kg 91.2 kg 83.9 kg    Examination:  General exam: on 2lit of [place] oxygen, appears calm and comfortable.  Respiratory system: clear to auscultation bilaterally , no wheezing or rhonchi.  Cardiovascular system: s1s2, heard, RRR, no JVD, improving pedal edema.  Gastrointestinal system: Abdomen is soft , non tender non distended, bowel sounds heard.  Central nervous system: alert and oriented, able to move all extremities. Following commands.  Extremities: improving leg edema.  Skin: No ulcers seen.  Psychiatry: Mood is appropriate.     Data Reviewed: I have personally reviewed following labs and imaging studies  CBC: Recent Labs  Lab 04/24/19 1024 04/25/19 0003  WBC 8.2 7.8  HGB 12.0* 11.6*  HCT 38.2* 36.8*  MCV 91.0 90.9  PLT 288 826   Basic Metabolic Panel: Recent Labs  Lab 04/24/19 1024 04/25/19 0003 04/25/19 0428 04/25/19 1544  NA 134*  --  134* 134*  K 4.6  --  3.8 4.5  CL 93*  --  94* 92*  CO2 28  --  30 29  GLUCOSE 93  --  76 93  BUN 53*  --  46* 50*  CREATININE 2.01* 1.84* 1.76* 2.02*  CALCIUM 8.5*  --  8.2* 8.5*  MG  --   --  2.5* 2.6*   GFR: Estimated Creatinine Clearance: 27.7 mL/min (A) (by C-G formula based on SCr of 2.02 mg/dL (H)). Liver Function Tests: Recent Labs  Lab 04/25/19 0428  AST 19  ALT 22  ALKPHOS 83  BILITOT 0.8  PROT 6.0*  ALBUMIN 3.3*   No results for input(s): LIPASE, AMYLASE in the last 168 hours. No results for input(s): AMMONIA in the last 168 hours. Coagulation Profile: No results for input(s): INR, PROTIME in the last 168  hours. Cardiac Enzymes: No results for input(s): CKTOTAL, CKMB, CKMBINDEX, TROPONINI in the last 168 hours. BNP (last 3 results) No results for input(s): PROBNP in the last 8760 hours. HbA1C: Recent Labs    04/25/19 0428  HGBA1C 5.9*   CBG: Recent Labs  Lab 04/25/19 1151 04/25/19 1603 04/25/19 2103 04/26/19 0647 04/26/19 1142  GLUCAP 121* 90 134* 81 114*   Lipid Profile: Recent Labs    04/25/19 0428  CHOL 164  HDL 35*  LDLCALC 103*  TRIG 132  CHOLHDL 4.7   Thyroid Function Tests: No results for input(s): TSH, T4TOTAL, FREET4, T3FREE, THYROIDAB in the last 72 hours. Anemia Panel: No results for input(s): VITAMINB12, FOLATE, FERRITIN, TIBC, IRON, RETICCTPCT in the last 72 hours.  Sepsis Labs: No results for input(s): PROCALCITON, LATICACIDVEN in the last 168 hours.  Recent Results (from the past 240 hour(s))  SARS CORONAVIRUS 2 (TAT 6-24 HRS) Nasopharyngeal Nasopharyngeal Swab     Status: None   Collection Time: 04/24/19 10:54 PM   Specimen: Nasopharyngeal Swab  Result Value Ref Range Status   SARS Coronavirus 2 NEGATIVE NEGATIVE Final    Comment: (NOTE) SARS-CoV-2 target nucleic acids are NOT DETECTED. The SARS-CoV-2 RNA is generally detectable in upper and lower respiratory specimens during the acute phase of infection. Negative results do not preclude SARS-CoV-2 infection, do not rule out co-infections with other pathogens, and should not be used as the sole basis for treatment or other patient management decisions. Negative results must be combined with clinical observations, patient history, and epidemiological information. The expected result is Negative. Fact Sheet for Patients: SugarRoll.be Fact Sheet for Healthcare Providers: https://www.woods-mathews.com/ This test is not yet approved or cleared by the [place] FDA and  has been authorized for detection and/or diagnosis of SARS-CoV-2 by FDA under an  Emergency Use Authorization (EUA). This EUA will remain  in effect (meaning this test can be used) for the duration of the COVID-19 declaration under Section 56 4(b)(1) of the Act, 21 U.S.C. section 360bbb-3(b)(1), unless the authorization is terminated or revoked sooner. Performed at [facility], [place] [address], [place], [place] [address]   MRSA PCR Screening     Status: None   Collection Time: 04/25/19  2:04 AM   Specimen: Nasal Mucosa; Nasopharyngeal  Result Value Ref Range Status   MRSA by PCR NEGATIVE NEGATIVE Final    Comment:        The GeneXpert MRSA Assay (FDA approved for NASAL specimens only), is one component of a comprehensive MRSA colonization surveillance program. It is not intended to diagnose MRSA infection nor to guide or monitor treatment for MRSA infections. Performed at [facility], [place] [address], [place], [place] [address]          Radiology Studies: MR ANGIO HEAD WO CONTRAST  Result Date: 04/25/2019 CLINICAL DATA:  Initial evaluation for acute stroke. EXAM: MRA HEAD WITHOUT CONTRAST TECHNIQUE: Angiographic images of the Circle of Willis were obtained using MRA technique without intravenous contrast. COMPARISON:  Prior MRI from 04/24/2019. FINDINGS: ANTERIOR CIRCULATION: Examination moderately degraded by motion artifact. Visualized distal cervical segments of the internal carotid arteries are patent with fairly symmetric antegrade flow. Petrous, cavernous, and supraclinoid ICAs patent without appreciable hemodynamically significant stenosis. ICA termini well perfused. A1 segments patent bilaterally. Normal anterior communicating artery complex. Anterior cerebral arteries patent to their distal aspects without stenosis. No M1 stenosis or occlusion. Grossly negative MCA bifurcations, although evaluation limited by motion. Distal MCA branches fairly well perfused and symmetric. POSTERIOR CIRCULATION: Visualized vertebral arteries patent to the  vertebrobasilar junction without stenosis. Left vertebral artery dominant. Neither PICA well visualized. Basilar patent to its distal aspect without stenosis. Superior cerebral arteries patent proximally. Both PCAs primarily supplied via the basilar, although a small right posterior communicating arteries noted. PCAs perfused to their distal aspects without appreciable stenosis. No obvious intracranial aneurysm on this motion degraded exam. IMPRESSION: 1. Technically limited exam due to motion artifact. 2. Negative intracranial MRA for large vessel occlusion. No proximal high-grade or correctable stenosis identified. Electronically Signed   By: Jeannine Boga M.D.   On: 04/25/2019 19:10   MR BRAIN WO CONTRAST  Result Date: 04/24/2019 CLINICAL DATA:  86 year old male with increasing bilateral lower extremity swelling.  Stuttering, lower extremity spasms. EXAM: MRI HEAD WITHOUT CONTRAST TECHNIQUE: Multiplanar, multiecho pulse sequences of the brain and surrounding structures were obtained without intravenous contrast. COMPARISON:  Head CT 09/23/2018. FINDINGS: Brain: Punctate area of abnormal diffusion in the right occipital lobe white matter near the occipital horn on series 4, image 19. Associated mild T2 hyperintensity. No other restricted diffusion or evidence of acute infarction. No chronic cerebral blood products or definite cortical encephalomalacia identified. Axial FLAIR images are degraded by artifact with scattered generally mild for age T2 and FLAIR hyperintensity. T2 heterogeneity in the bilateral deep gray nuclei appears to be combination of perivascular spaces and occasional small chronic lacunar infarcts. Negative brainstem and cerebellum. No midline shift, mass effect, evidence of mass lesion, ventriculomegaly, extra-axial collection or acute intracranial hemorrhage. Cervicomedullary junction and pituitary are within normal limits. The examination had to be discontinued prior to completion  due to excessive patient motion. No coronal T2 imaging was obtained. Vascular: Major intracranial vascular flow voids are preserved. Dominant and dolichoectatic left vertebral artery with underlying generalized intracranial arterial tortuosity. Skull and upper cervical spine: Negative visible cervical spine. Normal bone marrow signal. Sinuses/Orbits: Negative, postoperative changes to both globes. Other: Mastoids are clear. Visible internal auditory structures appear normal. Scalp and face soft tissues appear negative. IMPRESSION: 1. Punctate diffusion abnormality in the right occipital lobe white matter compatible with a tiny acute infarct. However, clinical significance is unclear. There is no associated hemorrhage or mass effect. 2. No other acute intracranial abnormality identified. Generally mild for age signal changes elsewhere in the brain compatible with chronic small vessel disease. Electronically Signed   By: Genevie Ann M.D.   On: 04/24/2019 19:35   EEG adult  Result Date: 04/25/2019 Lora Havens, MD     04/25/2019  2:07 PM Patient Name: BARNARD SHARPS MRN: [id number] Epilepsy Attending: Lora Havens Referring Physician/Provider: Dr. Rosalin Hawking Date: 04/25/2019 Duration: 27.57 minutes Patient history: 86 year old male with intermittent stuttering speech associated with body shaking/leg jerking movements.  EEG to evaluate for seizures. Level of alertness: Awake, drowsy AEDs during EEG study: None Technical aspects: This EEG study was done with scalp electrodes positioned according to the 10-20 International system of electrode placement. Electrical activity was acquired at a sampling rate of _0  and reviewed with a high frequency filter of _1  and a low frequency filter of _2 . EEG data were recorded continuously and digitally stored. Description: The posterior dominant rhythm consists of 9 Hz activity of moderate voltage (25-35 uV) seen predominantly in posterior head regions, symmetric and  reactive to eye opening and eye closing. Drowsiness was characterized by attenuation of the posterior background rhythm.  Single sharp transient was seen in right temporal region. Physiologic photic driving was seen during photic stimulation.  Hyperventilation was not performed.  Single EKG strip showed arrhythmia and frequent PVCs. IMPRESSION: This study is within normal limits. No seizures or epileptiform discharges were seen throughout the recording. However, only wakefulness and drowsiness were recorded. If suspicion for interictal activity remains a concern, a prolonged study including sleep should be considered. Lora Havens   ECHOCARDIOGRAM COMPLETE  Result Date: 04/25/2019    ECHOCARDIOGRAM REPORT   Patient Name:   NORMA IGNASIAK Date of Exam: 04/25/2019 Medical Rec #:  [id number]       Height:       67.0 in Accession #:    [id number]      Weight:       190.3 lb Date of Birth:  06-06-1931      BSA:          1.980 m Patient Age:    30 years        BP:           111/64 mmHg Patient Gender: M               HR:           84 bpm. Exam Location:  Inpatient Procedure: 2D Echo, Cardiac Doppler and Color Doppler Indications:    Stroke 434.91 / I163.9  History:        Patient has prior history of Echocardiogram examinations, most                 recent 07/20/2018. CHF, COPD; Risk Factors:Hypertension,                 Diabetes, Dyslipidemia and GERD. Edema. Lymphoma.  Sonographer:    Jonelle Sidle Dance Referring Phys: [place]  1. Left ventricular ejection fraction, by estimation, is 60 to 65%. The left ventricle has normal function. The left ventricle has no regional wall motion abnormalities. There is severe left ventricular hypertrophy. Left ventricular diastolic parameters  were normal.  2. Right ventricular systolic function is normal. The right ventricular size is normal. There is normal pulmonary artery systolic pressure.  3. Left atrial size was moderately dilated.  4. The mitral  valve is normal in structure. No evidence of mitral valve regurgitation. No evidence of mitral stenosis.  5. The aortic valve is tricuspid. Aortic valve regurgitation is trivial. No aortic stenosis is present.  6. The inferior vena cava is normal in size with greater than 50% respiratory variability, suggesting right atrial pressure of 3 mmHg. FINDINGS  Left Ventricle: Left ventricular ejection fraction, by estimation, is 60 to 65%. The left ventricle has normal function. The left ventricle has no regional wall motion abnormalities. The left ventricular internal cavity size was normal in size. There is  severe left ventricular hypertrophy. Left ventricular diastolic parameters were normal. Right Ventricle: The right ventricular size is normal. No increase in right ventricular wall thickness. Right ventricular systolic function is normal. There is normal pulmonary artery systolic pressure. The tricuspid regurgitant velocity is 2.51 m/s, and  with an assumed right atrial pressure of 3 mmHg, the estimated right ventricular systolic pressure is 29.5 mmHg. Left Atrium: Left atrial size was moderately dilated. Right Atrium: Right atrial size was normal in size. Pericardium: There is no evidence of pericardial effusion. Mitral Valve: The mitral valve is normal in structure. Normal mobility of the mitral valve leaflets. No evidence of mitral valve regurgitation. No evidence of mitral valve stenosis. Tricuspid Valve: The tricuspid valve is normal in structure. Tricuspid valve regurgitation is not demonstrated. No evidence of tricuspid stenosis. Aortic Valve: The aortic valve is tricuspid. . There is mild thickening and mild calcification of the aortic valve. Aortic valve regurgitation is trivial. No aortic stenosis is present. There is mild thickening of the aortic valve. There is mild calcification of the aortic valve. Pulmonic Valve: The pulmonic valve was normal in structure. Pulmonic valve regurgitation is mild. No  evidence of pulmonic stenosis. Aorta: The aortic root is normal in size and structure. Venous: The inferior vena cava is normal in size with greater than 50% respiratory variability, suggesting right atrial pressure of 3 mmHg. IAS/Shunts: No atrial level shunt detected by color flow Doppler.  LEFT VENTRICLE PLAX 2D LVIDd:  3.15 cm LVIDs:         2.24 cm LV PW:         0.97 cm LV IVS:        1.65 cm LVOT diam:     2.20 cm LV SV:         103 LV SV Index:   52 LVOT Area:     3.80 cm  RIGHT VENTRICLE             IVC RV Basal diam:  2.65 cm     IVC diam: 1.94 cm RV S prime:     20.30 cm/s TAPSE (M-mode): 1.9 cm LEFT ATRIUM             Index       RIGHT ATRIUM           Index LA diam:        4.40 cm 2.22 cm/m  RA Area:     14.80 cm LA Vol (A2C):   65.8 ml 33.24 ml/m RA Volume:   33.80 ml  17.07 ml/m LA Vol (A4C):   48.5 ml 24.50 ml/m LA Biplane Vol: 57.5 ml 29.04 ml/m  AORTIC VALVE LVOT Vmax:   156.00 cm/s LVOT Vmean:  97.700 cm/s LVOT VTI:    0.270 m  AORTA Ao Root diam: 3.40 cm Ao Asc diam:  3.30 cm MITRAL VALVE               TRICUSPID VALVE MV Area (PHT): 2.54 cm    TR Peak grad:   25.2 mmHg MV Decel Time: 299 msec    TR Vmax:        251.00 cm/s MV E velocity: 81.00 cm/s MV A velocity: 76.00 cm/s  SHUNTS MV E/A ratio:  1.07        Systemic VTI:  0.27 m                            Systemic Diam: 2.20 cm Jenkins Rouge MD Electronically signed by Jenkins Rouge MD Signature Date/Time: 04/25/2019/10:52:16 AM    Final    VAS US CAROTID (at [facility] and [facility] only)  Result Date: 04/25/2019 Carotid Arterial Duplex Study Indications:       CVA. Comparison Study:  No prior study Performing Technologist: Maudry Mayhew MHA, RDMS, RVT, RDCS  Examination Guidelines: A complete evaluation includes B-mode imaging, spectral Doppler, color Doppler, and power Doppler as needed of all accessible portions of each vessel. Bilateral testing is considered an integral part of a complete examination. Limited examinations for  reoccurring indications may be performed as noted.  Right Carotid Findings: +----------+--------+--------+--------+-----------------------+--------+             PSV cm/s EDV cm/s Stenosis Plaque Description      Comments  +----------+--------+--------+--------+-----------------------+--------+  CCA Prox   80       17                                                  +----------+--------+--------+--------+-----------------------+--------+  CCA Distal 81       16                smooth and heterogenous           +----------+--------+--------+--------+-----------------------+--------+  ICA Prox   150      50  40-59%   smooth and homogeneous            +----------+--------+--------+--------+-----------------------+--------+  ICA Distal 76       17                                                  +----------+--------+--------+--------+-----------------------+--------+  ECA        93       12                smooth and heterogenous           +----------+--------+--------+--------+-----------------------+--------+ +----------+--------+-------+----------------+-------------------+             PSV cm/s EDV cms Describe         Arm Pressure (mmHG)  +----------+--------+-------+----------------+-------------------+  Subclavian 88               Multiphasic, WNL                      +----------+--------+-------+----------------+-------------------+ +---------+--------+--------+--------------+  Vertebral PSV cm/s EDV cm/s Not identified  +---------+--------+--------+--------------+  Left Carotid Findings: +----------+--------+--------+--------+-----------------------+--------+             PSV cm/s EDV cm/s Stenosis Plaque Description      Comments  +----------+--------+--------+--------+-----------------------+--------+  CCA Prox   145      29                                                  +----------+--------+--------+--------+-----------------------+--------+  CCA Distal 66       18                smooth and heterogenous            +----------+--------+--------+--------+-----------------------+--------+  ICA Prox   94       20                smooth and heterogenous           +----------+--------+--------+--------+-----------------------+--------+  ICA Distal 92       21                                                  +----------+--------+--------+--------+-----------------------+--------+  ECA        97       10                smooth and heterogenous           +----------+--------+--------+--------+-----------------------+--------+ +----------+--------+--------+----------------+-------------------+             PSV cm/s EDV cm/s Describe         Arm Pressure (mmHG)  +----------+--------+--------+----------------+-------------------+  Subclavian 73                Multiphasic, WNL                      +----------+--------+--------+----------------+-------------------+ +---------+--------+--+--------+-+---------+  Vertebral PSV cm/s 52 EDV cm/s 9 Antegrade  +---------+--------+--+--------+-+---------+   Summary: Right Carotid: Velocities in the right ICA are consistent with a 40-59%  stenosis. Left Carotid: Velocities in the left ICA are consistent with a 1-39% stenosis. Vertebrals:  Left vertebral artery demonstrates antegrade flow. Right vertebral              artery was not visualized. Subclavians: Normal flow hemodynamics were seen in bilateral subclavian              arteries. *See table(s) above for measurements and observations.  Electronically signed by Ruta Hinds MD on 04/25/2019 at 3:59:50 PM.    Final         Scheduled Meds:  allopurinol  50 mg Oral Daily   aspirin  300 mg Rectal Daily   Or   aspirin  325 mg Oral Daily   atorvastatin  40 mg Oral q1800   diltiazem  360 mg Oral Daily   enoxaparin (LOVENOX) injection  30 mg Subcutaneous Q24H   escitalopram  10 mg Oral Daily   finasteride  5 mg Oral QHS   insulin aspart  0-9 Units Subcutaneous TID WC   latanoprost  1 drop Both Eyes QHS    metolazone  2.5 mg Oral Q Wed   pantoprazole  40 mg Oral Daily   potassium chloride  10 mEq Oral Daily   Riociguat  1.5 mg Oral TID   rOPINIRole  2 mg Oral TID   Selexipag  1,600 mcg Oral BID   spironolactone  25 mg Oral Daily   torsemide  80 mg Oral BID   traZODone  100 mg Oral QHS   Continuous Infusions:   LOS: 2 days        Hosie Poisson, MD [facility]   To contact the attending provider between 7A-7P or the covering provider during after hours 7P-7A, please log into the web site [email or web address] and access using universal [place] password for that web site. If you do not have the password, please call the hospital operator.  04/26/2019, 2:03 PM

## 2019-04-26 NOTE — Progress Notes (Signed)
STROKE TEAM PROGRESS NOTE   INTERVAL HISTORY Pt lying in bed comfortably. He had several procedure done today including loop recorder and CARDIAC AMYLOIDOSIS SCAN WITH SPECT. LE venous doppler still pending.   Vitals:   04/26/19 0500 04/26/19 0755 04/26/19 0827 04/26/19 1140  BP:  (!) 97/51  (!) 104/58  Pulse:  75  67  Resp:  18  16  Temp:  98.2 F (36.8 C)  98.3 F (36.8 C)  TempSrc:  Oral  Oral  SpO2:  94%  95%  Weight: 91.2 kg  83.9 kg   Height:        CBC:  Recent Labs  Lab 04/24/19 1024 04/25/19 0003  WBC 8.2 7.8  HGB 12.0* 11.6*  HCT 38.2* 36.8*  MCV 91.0 90.9  PLT 288 791    Basic Metabolic Panel:  Recent Labs  Lab 04/25/19 0428 04/25/19 1544  NA 134* 134*  K 3.8 4.5  CL 94* 92*  CO2 30 29  GLUCOSE 76 93  BUN 46* 50*  CREATININE 1.76* 2.02*  CALCIUM 8.2* 8.5*  MG 2.5* 2.6*   Lipid Panel:     Component Value Date/Time   CHOL 164 04/25/2019 0428   TRIG 132 04/25/2019 0428   TRIG 76 11/23/2005 0911   HDL 35 (L) 04/25/2019 0428   CHOLHDL 4.7 04/25/2019 0428   VLDL 26 04/25/2019 0428   LDLCALC 103 (H) 04/25/2019 0428   HgbA1c:  Lab Results  Component Value Date   HGBA1C 5.9 (H) 04/25/2019   Urine Drug Screen: No results found for: LABOPIA, COCAINSCRNUR, LABBENZ, AMPHETMU, THCU, LABBARB  Alcohol Level No results found for: ETH  IMAGING past 24 hours MR ANGIO HEAD WO CONTRAST  Result Date: 04/25/2019 CLINICAL DATA:  Initial evaluation for acute stroke. EXAM: MRA HEAD WITHOUT CONTRAST TECHNIQUE: Angiographic images of the Circle of Willis were obtained using MRA technique without intravenous contrast. COMPARISON:  Prior MRI from 04/24/2019. FINDINGS: ANTERIOR CIRCULATION: Examination moderately degraded by motion artifact. Visualized distal cervical segments of the internal carotid arteries are patent with fairly symmetric antegrade flow. Petrous, cavernous, and supraclinoid ICAs patent without appreciable hemodynamically significant stenosis. ICA  termini well perfused. A1 segments patent bilaterally. Normal anterior communicating artery complex. Anterior cerebral arteries patent to their distal aspects without stenosis. No M1 stenosis or occlusion. Grossly negative MCA bifurcations, although evaluation limited by motion. Distal MCA branches fairly well perfused and symmetric. POSTERIOR CIRCULATION: Visualized vertebral arteries patent to the vertebrobasilar junction without stenosis. Left vertebral artery dominant. Neither PICA well visualized. Basilar patent to its distal aspect without stenosis. Superior cerebral arteries patent proximally. Both PCAs primarily supplied via the basilar, although a small right posterior communicating arteries noted. PCAs perfused to their distal aspects without appreciable stenosis. No obvious intracranial aneurysm on this motion degraded exam. IMPRESSION: 1. Technically limited exam due to motion artifact. 2. Negative intracranial MRA for large vessel occlusion. No proximal high-grade or correctable stenosis identified. Electronically Signed   By: Jeannine Boga M.D.   On: 04/25/2019 19:10   VAS US CAROTID (at [facility] and [facility] only)  Result Date: 04/25/2019 Carotid Arterial Duplex Study Indications:       CVA. Comparison Study:  No prior study Performing Technologist: Maudry Mayhew MHA, RDMS, RVT, RDCS  Examination Guidelines: A complete evaluation includes B-mode imaging, spectral Doppler, color Doppler, and power Doppler as needed of all accessible portions of each vessel. Bilateral testing is considered an integral part of a complete examination. Limited examinations for reoccurring indications may be  performed as noted.  Right Carotid Findings: +----------+--------+--------+--------+-----------------------+--------+           PSV cm/sEDV cm/sStenosisPlaque Description     Comments +----------+--------+--------+--------+-----------------------+--------+ CCA Prox  80      17                                               +----------+--------+--------+--------+-----------------------+--------+ CCA Distal81      16              smooth and heterogenous         +----------+--------+--------+--------+-----------------------+--------+ ICA Prox  150     50      40-59%  smooth and homogeneous          +----------+--------+--------+--------+-----------------------+--------+ ICA Distal76      17                                              +----------+--------+--------+--------+-----------------------+--------+ ECA       93      12              smooth and heterogenous         +----------+--------+--------+--------+-----------------------+--------+ +----------+--------+-------+----------------+-------------------+           PSV cm/sEDV cmsDescribe        Arm Pressure (mmHG) +----------+--------+-------+----------------+-------------------+ [id number]             Multiphasic, WNL                    +----------+--------+-------+----------------+-------------------+ +---------+--------+--------+--------------+ VertebralPSV cm/sEDV cm/sNot identified +---------+--------+--------+--------------+  Left Carotid Findings: +----------+--------+--------+--------+-----------------------+--------+           PSV cm/sEDV cm/sStenosisPlaque Description     Comments +----------+--------+--------+--------+-----------------------+--------+ CCA Prox  145     29                                              +----------+--------+--------+--------+-----------------------+--------+ CCA Distal66      18              smooth and heterogenous         +----------+--------+--------+--------+-----------------------+--------+ ICA Prox  94      20              smooth and heterogenous         +----------+--------+--------+--------+-----------------------+--------+ ICA Distal92      21                                               +----------+--------+--------+--------+-----------------------+--------+ ECA       97      10              smooth and heterogenous         +----------+--------+--------+--------+-----------------------+--------+ +----------+--------+--------+----------------+-------------------+           PSV cm/sEDV cm/sDescribe        Arm Pressure (mmHG) +----------+--------+--------+----------------+-------------------+ [id number]              Multiphasic, WNL                    +----------+--------+--------+----------------+-------------------+ +---------+--------+--+--------+-+---------+  VertebralPSV cm/s52EDV cm/s9Antegrade +---------+--------+--+--------+-+---------+   Summary: Right Carotid: Velocities in the right ICA are consistent with a 40-59%                stenosis. Left Carotid: Velocities in the left ICA are consistent with a 1-39% stenosis. Vertebrals:  Left vertebral artery demonstrates antegrade flow. Right vertebral              artery was not visualized. Subclavians: Normal flow hemodynamics were seen in bilateral subclavian              arteries. *See table(s) above for measurements and observations.  Electronically signed by Ruta Hinds MD on 04/25/2019 at 3:59:50 PM.    Final     PHYSICAL EXAM  Temp:  [97.5 F (36.4 C)-98.7 F (37.1 C)] 98.3 F (36.8 C) (03/25 1140) Pulse Rate:  [67-76] 67 (03/25 1140) Resp:  [16-20] 16 (03/25 1140) BP: (86-104)/(51-60) 104/58 (03/25 1140) SpO2:  [92 %-96 %] 95 % (03/25 1140) Weight:  [83.9 kg-91.2 kg] 83.9 kg (03/25 0827)  General - Well nourished, well developed, in no apparent distress.  Ophthalmologic - fundi not visualized due to noncooperation.  Cardiovascular - Regular rhythm and rate.  Mental Status -  Level of arousal and orientation to time, place, and person were intact. Language including expression, naming, repetition, comprehension was assessed and found intact. Fund of Knowledge was assessed and was  intact.  Cranial Nerves II - XII - II - Visual field intact OU. III, IV, VI - Extraocular movements intact. V - Facial sensation intact bilaterally. VII - Facial movement intact bilaterally. VIII - Hearing & vestibular intact bilaterally. X - Palate elevates symmetrically. XI - Chin turning & shoulder shrug intact bilaterally. XII - Tongue protrusion intact.  Motor Strength - The patient's strength was normal in all extremities and pronator drift was absent. BLE swelling distal > proximal. Bulk was normal and fasciculations were absent.   Motor Tone - Muscle tone was assessed at the neck and appendages and was normal.  Reflexes - The patient's reflexes were symmetrical in all extremities and he had no pathological reflexes.  Sensory - Light touch, temperature/pinprick were assessed and were symmetrical. However, decreased light touch from knee down bilaterally, R>L   Coordination - The patient had normal movements in the hands with no ataxia or dysmetria.  Tremor was absent.  Gait and Station - deferred.   ASSESSMENT/PLAN Ryan [name] is a 86 y.o. male with history of CHF, COPD,non-Hodgkin's lymphoma pelvic mass status post radiation therapy diabetes mellitus type 2 chronic kidney disease stage IV presenting with Swollen legs, low back pain and restless legs.   Myoclonus and stuttering likely due to anxiety and stress  random and asynchronized jerking of all extremities.  Symptoms happen in the setting of discomfort, upset, back pain, severe RLS, with anxiety  EEG neg  Recommend relaxation, de-stress and cope with anxiety  Continue to follow up with Dr. Jannifer Franklin at [facility] for RLS, radiculopathy  Stroke:   Incidental punctate R occipital infarct likely d/t small vessel disease, infarct is not the etiology of his presenting symptoms   MRI  Punctate R occipital lobe white matter infarct. Small vessel disease.   MRA  Unremarkable   Carotid Doppler  Right ICA 40-59%  stenosis  2D Echo EF 60-65%. No source of embolus   Loop recorder placed for severe LVH and diastolic CHF Lovena Le) 4/56  EEG no sz during wake and drowsy states. Did not include sleep.  LDL 103  HgbA1c 5.9  Lovenox 30 mg sq daily for VTE prophylaxis  No antithrombotic prior to admission, now on aspirin 325 mg daily. Continue on discharge.  Therapy recommendations:  HH PT, HH OT  Disposition:  pending   Leg swelling  Worsening as per wife  LE venous doppler pending   Hyperlipidemia  Home meds:  No statin  LDL 103, goal < 70  Added lipitor 40   Continue statin at discharge  Diabetes type II Controlled  HgbA1c 5.9, goal < 7.0  CBGs  SSI  PCP follow up  Other Stroke Risk Factors  Advanced age  Former Cigarette smoker  ETOH use, advised to drink no more than 2 drink(s) a day  Obesity, Body mass index is 28.96 kg/m., recommend weight loss, diet and exercise as appropriate   Family hx stroke (other)  Chronic diastolic Congestive heart failure  Other Active Problems  Non-Hodgkin's Lymphoma   Pulmonary HTN  CKD stage 3  Mild normocytic anemia   Abnormal Movements of LE - ? RLS - Followed by Dr. Jannifer Franklin as an OP - On ropinirole   Hospital day # 2  Neurology will sign off. Please call with questions. Pt will follow up with Dr. Jannifer Franklin at [facility] in about 4 weeks. Thanks for the consult.   Rosalin Hawking, MD PhD Stroke Neurology 04/26/2019 3:18 PM    To contact Stroke Continuity provider, please refer to [email or web address] After hours, contact General Neurology

## 2019-04-26 NOTE — Evaluation (Signed)
Speech Language Pathology Evaluation Patient Details Name: Ryan [name] MRN: [id number] DOB: 06/11/1931 Today's Date: 04/26/2019 Time: 3335-4562 SLP Time Calculation (min) (ACUTE ONLY): 30 min  Problem List:  Patient Active Problem List   Diagnosis Date Noted  . Acute CVA (cerebrovascular accident) ([place]) 04/24/2019  . Diabetes mellitus type 2 in nonobese ([place]) 04/24/2019  . Acute respiratory failure with hypoxia and hypercarbia ([place]) 09/20/2018  . CKD (chronic kidney disease) stage 3, GFR 30-59 ml/min 09/20/2018  . Lethargy 09/20/2018  . Acute metabolic encephalopathy 56/38/9373  . CHF (congestive heart failure) ([place]) 09/20/2018  . Acute kidney injury superimposed on CKD ([place]) 09/10/2018  . Pulmonary hypertension, unspecified ([place]) 09/10/2018  . Multifocal atrial tachycardia ([place]) 09/10/2018  . CHF exacerbation ([place]) 09/07/2018  . Leg pain 09/07/2018  . Hyperkalemia 07/26/2018  . Acute on chronic diastolic CHF (congestive heart failure) ([place])   . Bilateral lower extremity edema 02/02/2017  . Pulmonary fibrosis ([place]) 11/22/2016  . Blurry vision, bilateral 10/26/2016  . COPD ([place]) 07/05/2016  . Exertional dyspnea 09/10/2015  . Fatigue/malaise 08/19/2015  . Asthma/COPD 02/12/2015  . 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 Hypoxemia 01/05/2014  . Essential hypertension 01/05/2014  . Hypokalemia 01/05/2014  . Urinary retention 01/05/2014  . Hypoxia   . Lumbar radiculopathy 12/31/2013  . RLS (restless legs syndrome) 09/21/2012  . Chronic lower back pain 06/15/2012  . Abdominal pain, unspecified site 05/19/2012  . Unspecified deficiency anemia 04/07/2012  . Melanoma ([place])   . Non Hodgkin's lymphoma ([place])   . Periaortic lymphadenopathy 02/16/2011  . Barrett's esophagus 07/09/2010  . Personal history of colonic polyps 05/28/2010  . History of IBS 05/28/2010  . Diverticulosis of colon (without mention of hemorrhage) 05/28/2010  . Arthritis involving multiple sites  05/28/2010  . Wheat intolerance 05/28/2010  . Chronic venous insufficiency 08/19/2009  . PEDAL EDEMA 08/19/2009  . INSOMNIA, CHRONIC 12/24/2008  . EUSTACHIAN TUBE DYSFUNCTION, BILATERAL 12/23/2008  . ERECTILE DYSFUNCTION, ORGANIC 08/01/2008  . Allergic rhinitis with a nonallergic component 05/20/2008  . VENTRAL HERNIA 02/12/2008  . PALPITATIONS 12/25/2007  . LACTOSE INTOLERANCE 10/17/2007  . Hyperlipidemia 09/14/2006  . BPH (benign prostatic hyperplasia) 09/14/2006  . OSTEOARTHRITIS 09/14/2006  . HEART MURMUR, HX OF 09/14/2006   Past Medical History:  Past Medical History:  Diagnosis Date  . Asthma   . Barrett's esophagus   . Benign prostatic hypertrophy   . Bilateral lower extremity edema   . CHF (congestive heart failure) ([place])   . Chronic fatigue   . COPD (chronic obstructive pulmonary disease) ([place])   . Diabetes mellitus type 2 in nonobese ([place]) 04/24/2019  . Diverticulosis of colon (without mention of hemorrhage)   . Esophageal reflux   . Gastritis   . Gluten intolerance   . Hiatal hernia 02/10/11   Noted on CT Scan - Moderate Hiatal Hernia  . Hyperlipemia   . Hypertension   . Lymphoma of lymph nodes in pelvis ([place]) 03/03/2011    Large Right Retroperitoneal Mass  . Melanoma ([place])    lymphoma  . Microcytic anemia 04/20/2011   . 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 Osteoarthritis    hands/feet,knees, NECK, BACK  . Periaortic lymphadenopathy 02/16/2011  . Personal history of colonic polyps 2004   hyperplastic Dr. Collene Mares  . Pulmonary hyperinflation   . Renal cyst 02/10/11    Noted on CT Scan - Bilateral Renal Cysts  . S/P radiation therapy 03/15/11 - 04/09/11   Abdominal/  Pelvic Tumor, 3600 cGy/20 Fractions   Past Surgical History:  Past Surgical History:  Procedure Laterality Date  . ARTHROSCOPIC REPAIR ACL     right  . BONE MARROW ASPIRATION  02/25/11   Bone Marrow, Aspirate, Clot, and Bilateral Bx, Right PIC   . CATARACT EXTRACTION, BILATERAL    . EYE SURGERY    . HERNIA REPAIR     LEFT INGUINAL   . INSERTION OF MESH N/A 08/23/2012   Procedure: INSERTION OF MESH;  Surgeon: Madilyn Hook, DO;  Location: [facility];  Service: General;  Laterality: N/A;  . LUMBAR LAMINECTOMY/DECOMPRESSION MICRODISCECTOMY Right 12/31/2013   Procedure: RIGHT L4-5 L5-S1 LAMINECTOMY;  Surgeon: Kristeen Miss, MD;  Location: [facility];  Service: Neurosurgery;  Laterality: Right;  RIGHT L4-5 L5-S1 LAMINECTOMY  . RIGHT HEART CATH N/A 07/25/2018   Procedure: RIGHT HEART CATH;  Surgeon: Larey Dresser, MD;  Location: [facility];  Service: Cardiovascular;  Laterality: N/A;  . ROTATOR CUFF REPAIR     right  . TONSILLECTOMY    . TONSILLECTOMY    . VENTRAL HERNIA REPAIR N/A 08/23/2012   Procedure: HERNIA REPAIR VENTRAL ADULT;  Surgeon: Madilyn Hook, DO;  Location: [facility];  Service: General;  Laterality: N/A;   HPI:  86 yo male admitted with intermittent stuttering speech associated body shaking/ leg jerking movements. MRI (+) punctate occipital lobe infarct PMH CHF COPD non hodgkin's lymphoma pelvic mass DM    Assessment / Plan / Recommendation Clinical Impression  Pt presents with clear and fluent speech, acknowleding that he only has moments of stuttering when he is having his other physical symptoms as well. His mentation appears to be at baseline, acknowledging that he has a h/o memory difficulties and that he has a hard time keeping up with some complex tasks, like his long list of medications. He receives help from [facility] for this and also has frequent rounding from staff. He seems to have the assistance he needs available to him. No SLP f/u needed for communication as his difficulties are episodic. Pt is in agreement.     SLP Assessment  SLP Recommendation/Assessment: Patient does not need any further Speech Lanaguage Pathology Services SLP Visit Diagnosis: Cognitive communication deficit (R41.841)    Follow Up  Recommendations  Other (comment)(intermittent supervision)    Frequency and Duration           SLP Evaluation Cognition  Overall Cognitive Status: History of cognitive impairments - at baseline       Comprehension  Auditory Comprehension Overall Auditory Comprehension: Appears within functional limits for tasks assessed    Expression Expression Primary Mode of Expression: Verbal Verbal Expression Overall Verbal Expression: Appears within functional limits for tasks assessed   Oral / Motor  Motor Speech Overall Motor Speech: Appears within functional limits for tasks assessed   GO                     Osie Bond., M.A. [place] [place] Acute Rehabilitation Services Pager [phone] Office [phone]  04/26/2019, 11:08 AM

## 2019-04-26 NOTE — Progress Notes (Signed)
PT Cancellation Note  Patient Details Name: Ryan [name] MRN: [id number] DOB: 12/27/1931   Cancelled Treatment:    Reason Eval/Treat Not Completed: Patient at procedure or test/unavailable. Upon PT arrival, Pt leaving floor for placement of loop recorder. Attempted x2 later in afternoon, but pt has still not returned to the floor. PT will continue to follow and treat as time/schedule allow.   Karma Ganja, PT, DPT   Acute Rehabilitation Department Pager #: [phone]   Otho Bellows 04/26/2019, 2:58 PM

## 2019-04-26 NOTE — Consult Note (Addendum)
ELECTROPHYSIOLOGY CONSULT NOTE    Patient ID: Ryan [name] MRN: [id number], DOB/AGE: 1931/06/04 86 y.o.  Admit date: 04/24/2019 Date of Consult: 04/26/2019  Primary Physician: Roetta Sessions, NP Primary Cardiologist: Gwenlyn Found Heart Failure: Aundra Dubin Electrophysiologist: Lovena Le (new this admission)  Patient Profile: 86 y.o.with history of interstitial lung disease (?NSIP vs UIP) on home oxygen, pulmonary hypertension, diastolic CHF, and prior non-Hodgkins lymphoma (pelvic mass, s/p XRT). Patient has history of reportedly mild ILD (NSIP vs UIP) followed by a pulmonologist at [facility] and pulmonary hypertension thought to be out of proportion lung disease followed by a physician at [facility]. Last CT chest was in 11/19, showing stable ILD (?UIP vs NSIP). He had RHC in 2/20 with moderate PAH, PVR 5.1 WU. He was tried on Adcirca but developed cognitive problems and worsening of peripheral edema so this was stopped. He has been on home oxygen, uses at night and with exertion. He was found on admission to have incidental R occipital infarct. EP has been asked to evaluate for ILR placement by Dr Aundra Dubin  HPI:  Ryan [name] is a 86 y.o. male with the above past medical history. He was admitted with worsening HF symptoms and found to have incidental right occipital lobe infarct.  Because of severe LVH and PAH, Dr Aundra Dubin has asked EP to evaluate for ILR implant.   He denies chest pain, dyspnea, PND, orthopnea, nausea, vomiting, dizziness, syncope, edema, weight gain, or early satiety.  Past Medical History:  Diagnosis Date  . Asthma   . Barrett's esophagus   . Benign prostatic hypertrophy   . Bilateral lower extremity edema   . CHF (congestive heart failure) ([place])   . Chronic fatigue   . COPD (chronic obstructive pulmonary disease) ([place])   . Diabetes mellitus type 2 in nonobese ([place]) 04/24/2019  . Diverticulosis of colon (without mention of hemorrhage)   . Esophageal reflux   .  Gastritis   . Gluten intolerance   . Hiatal hernia 02/10/11   Noted on CT Scan - Moderate Hiatal Hernia  . Hyperlipemia   . Hypertension   . Lymphoma of lymph nodes in pelvis ([place]) 03/03/2011    Large Right Retroperitoneal Mass  . Melanoma ([place])    lymphoma  . Microcytic anemia 04/20/2011   . 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 Osteoarthritis    hands/feet,knees, NECK, BACK  . Periaortic lymphadenopathy 02/16/2011  . Personal history of colonic polyps 2004   hyperplastic Dr. Collene Mares  . Pulmonary hyperinflation   . Renal cyst 02/10/11    Noted on CT Scan - Bilateral Renal Cysts  . S/P radiation therapy 03/15/11 - 04/09/11   Abdominal/ Pelvic Tumor, 3600 cGy/20 Fractions     Surgical History:  Past Surgical History:  Procedure Laterality Date  . ARTHROSCOPIC REPAIR ACL     right  . BONE MARROW ASPIRATION  02/25/11   Bone Marrow, Aspirate, Clot, and Bilateral Bx, Right PIC  . CATARACT EXTRACTION, BILATERAL    . EYE SURGERY    . HERNIA REPAIR     LEFT INGUINAL   . INSERTION OF MESH N/A 08/23/2012   Procedure: INSERTION OF MESH;  Surgeon: Madilyn Hook, DO;  Location: [facility];  Service: General;  Laterality: N/A;  . LUMBAR LAMINECTOMY/DECOMPRESSION MICRODISCECTOMY Right 12/31/2013   Procedure: RIGHT L4-5 L5-S1 LAMINECTOMY;  Surgeon: Kristeen Miss, MD;  Location: [facility];  Service: Neurosurgery;  Laterality: Right;  RIGHT L4-5 L5-S1 LAMINECTOMY  .  RIGHT HEART CATH N/A 07/25/2018   Procedure: RIGHT HEART CATH;  Surgeon: Larey Dresser, MD;  Location: [facility];  Service: Cardiovascular;  Laterality: N/A;  . ROTATOR CUFF REPAIR     right  . TONSILLECTOMY    . TONSILLECTOMY    . VENTRAL HERNIA REPAIR N/A 08/23/2012   Procedure: HERNIA REPAIR VENTRAL ADULT;  Surgeon: Madilyn Hook, DO;  Location: [facility];  Service: General;  Laterality: N/A;     Medications Prior to Admission  Medication Sig Dispense Refill Last Dose  .  allopurinol (ZYLOPRIM) 100 MG tablet Take 50 mg by mouth daily.   04/24/2019 at Unknown time  . diazepam (VALIUM) 5 MG tablet Take 5 mg by mouth 2 (two) times daily as needed for anxiety.    Past Month at Unknown time  . diltiazem (CARDIZEM CD) 360 MG 24 hr capsule Take 1 capsule (360 mg total) by mouth daily. 90 capsule 3 04/24/2019 at Unknown time  . escitalopram (LEXAPRO) 10 MG tablet Take 10 mg by mouth daily.   04/25/2019 at Unknown time  . finasteride (PROSCAR) 5 MG tablet Take 1 tablet (5 mg total) by mouth daily. (Patient taking differently: Take 5 mg by mouth at bedtime. ) 30 tablet 5 04/24/2019 at Unknown time  . latanoprost (XALATAN) 0.005 % ophthalmic solution Place 1 drop into both eyes at bedtime.    04/23/19 at Unknown time  . metolazone (ZAROXOLYN) 2.5 MG tablet Take 1 tablet (2.5 mg total) by mouth once a week. Every Wednesday (Patient taking differently: Take 2.5 mg by mouth once a week. Every Thursday, but was instructed to take a dose on Tuesday this week) 10 tablet 3 04/24/2019 at Unknown time  . omeprazole (PRILOSEC) 20 MG capsule Take 20 mg by mouth daily.   04/24/2019 at Unknown time  . potassium chloride (KLOR-CON) 10 MEQ tablet Take 10 mEq by mouth daily.   04/24/2019 at Unknown time  . Riociguat (ADEMPAS) 1.5 MG TABS Take 1.5 mg by mouth 3 (three) times daily.   04/24/2019 at Unknown time  . rOPINIRole (REQUIP) 2 MG tablet Take 1 tablet (2 mg total) by mouth in the morning, at noon, and at bedtime. 90 tablet 3 Past Week at Unknown time  . Selexipag 1600 MCG TABS Take 1,600 mcg by mouth 2 (two) times daily.   04/24/2019 at Unknown time  . torsemide (DEMADEX) 20 MG tablet Take 4 tablets (80 mg total) by mouth 2 (two) times daily. 240 tablet 6 04/24/2019 at Unknown time  . traMADol (ULTRAM) 50 MG tablet Take 50 mg by mouth every 6 (six) hours as needed for moderate pain.    04/24/2019 at Unknown time  . traZODone (DESYREL) 100 MG tablet Take 100 mg by mouth at bedtime.   04/24/2019 at  Unknown time  . FLONASE SENSIMIST 27.5 MCG/SPRAY nasal spray Place 1 spray into the nose daily as needed for rhinitis or allergies.    Not Taking at Unknown time  . glipiZIDE (GLUCOTROL) 5 MG tablet Take 0.5 tablets (2.5 mg total) by mouth daily before breakfast. 15 tablet 0   . ipratropium (ATROVENT) 0.03 % nasal spray Place 2 sprays into both nostrils 2 (two) times daily.   Not Taking at Unknown time  . methocarbamol (ROBAXIN) 500 MG tablet Take 500 mg by mouth 3 (three) times daily.   Not Taking at Unknown time  . Potassium Chloride ER 20 MEQ TBCR Take 10 mEq by mouth daily. (Patient not taking: Reported on  04/25/2019) 90 tablet 3 Not Taking at Unknown time  . potassium chloride SA (KLOR-CON) 20 MEQ tablet Take 1 tablet by mouth in the morning, at noon, in the evening, and at bedtime.   Not Taking at Unknown time  . spironolactone (ALDACTONE) 25 MG tablet Take 1 tablet (25 mg total) by mouth daily. 30 tablet 0     Inpatient Medications:  . allopurinol  50 mg Oral Daily  . aspirin  300 mg Rectal Daily   Or  . aspirin  325 mg Oral Daily  . atorvastatin  40 mg Oral q1800  . diltiazem  360 mg Oral Daily  . enoxaparin (LOVENOX) injection  30 mg Subcutaneous Q24H  . escitalopram  10 mg Oral Daily  . finasteride  5 mg Oral QHS  . insulin aspart  0-9 Units Subcutaneous TID WC  . latanoprost  1 drop Both Eyes QHS  . metolazone  2.5 mg Oral Q Wed  . pantoprazole  40 mg Oral Daily  . potassium chloride  10 mEq Oral Daily  . Riociguat  1.5 mg Oral TID  . rOPINIRole  2 mg Oral TID  . Selexipag  1,600 mcg Oral BID  . spironolactone  25 mg Oral Daily  . torsemide  80 mg Oral BID  . traZODone  100 mg Oral QHS    Allergies:  Allergies  Allergen Reactions  . Pollen Extract-Tree Extract Other (See Comments)    HEADACHES, TIRED , DRAINAGE FROM SINUSES  . Molds & Smuts Other (See Comments)    Also dust mites causes sinus infections, h/a etc.    Social History   Socioeconomic History  .  Marital status: Married    Spouse name: Not on file  . Number of children: 2  . Years of education: Not on file  . Highest education level: Not on file  Occupational History  . 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 Smoking status: Former Smoker    Packs/day: 1.00    Years: 35.00    Pack years: 35.00    Types: Pipe, Cigarettes    Quit date: 02/23/1992    Years since quitting: 27.1  . Smokeless tobacco: Never Used  . Tobacco comment: quit 20 years ago  Substance and Sexual Activity  . Alcohol use: Yes    Comment: 2 drinks daily scotch  AND WINE WITH SUPPER  . Drug use: No  . Sexual activity: Not on file  Other Topics Concern  . Not on file  Social History Narrative   Married - second marriage   He has two children   Retired Horticulturist, commercial Professor   Currently teaches pottery making, 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[facility]   Former Smoker quit 12 years ago- smoked for 35 years   Alcohol use- yes   Social Determinants of Radio broadcast assistant Strain:   . Difficulty of Paying Living Expenses:   Food Insecurity:   . Worried About Charity fundraiser in the Last Year:   . Arboriculturist in the Last Year:   Transportation Needs:   . Film/video editor (Medical):   Marland Kitchen Lack of Transportation (Non-Medical):   Physical Activity:   . Days of Exercise per Week:   . Minutes of Exercise per Session:   Stress:   . Feeling of Stress :   Social Connections:   . Frequency of Communication with Friends and Family:   . Frequency of  Social Gatherings with Friends and Family:   . Attends Religious Services:   . Active Member of Clubs or Organizations:   . Attends Archivist Meetings:   Marland Kitchen Marital Status:   Intimate Partner Violence:   . Fear of Current or Ex-Partner:   . Emotionally Abused:   Marland Kitchen Physically Abused:   . Sexually Abused:      Family History  Problem Relation Age of Onset  . Heart disease Father        MI 47  .  Urticaria Father   . Prostate cancer Brother   . Prostate cancer Paternal Uncle   . Prostate cancer Paternal Uncle   . Prostate cancer Paternal Uncle   . Hyperlipidemia Other   . Stroke Other   . Hypertension Other   . ADD / ADHD Other   . Colon cancer Neg Hx   . Allergic rhinitis Neg Hx   . Asthma Neg Hx   . Eczema Neg Hx      Review of Systems: All other systems reviewed and are otherwise negative except as noted above.  Physical Exam: Vitals:   04/26/19 0445 04/26/19 0500 04/26/19 0755 04/26/19 0827  BP: (!) 98/58  (!) 97/51   Pulse:   75   Resp:   18   Temp:   98.2 F (36.8 C)   TempSrc:   Oral   SpO2:   94%   Weight:  91.2 kg  83.9 kg  Height:        GEN- The patient is well appearing, alert and oriented x 3 today.   HEENT: normocephalic, atraumatic; sclera clear, conjunctiva pink; hearing intact; oropharynx clear; neck supple Lungs- Clear to ausculation bilaterally, normal work of breathing.  No wheezes, rales, rhonchi Heart- Regular rate and rhythm, no murmurs, rubs or gallops GI- soft, non-tender, non-distended, bowel sounds present Extremities- no clubbing, cyanosis, or edema; DP/PT/radial pulses 2+ bilaterally MS- no significant deformity or atrophy Skin- warm and dry, no rash or lesion Psych- euthymic mood, full affect Neuro- strength and sensation are intact  Labs:   Lab Results  Component Value Date   WBC 7.8 04/25/2019   HGB 11.6 (L) 04/25/2019   HCT 36.8 (L) 04/25/2019   MCV 90.9 04/25/2019   PLT 284 04/25/2019    Recent Labs  Lab 04/25/19 0428 04/25/19 0428 04/25/19 1544  NA 134*   < > 134*  K 3.8   < > 4.5  CL 94*   < > 92*  CO2 30   < > 29  BUN 46*   < > 50*  CREATININE 1.76*   < > 2.02*  CALCIUM 8.2*   < > 8.5*  PROT 6.0*  --   --   BILITOT 0.8  --   --   ALKPHOS 83  --   --   ALT 22  --   --   AST 19  --   --   GLUCOSE 76   < > 93   < > = values in this interval not displayed.      Radiology/Studies: DG Chest 2  View  Result Date: 04/24/2019 CLINICAL DATA:  Lower extremity edema. EXAM: CHEST - 2 VIEW COMPARISON:  09/20/2018 FINDINGS: The heart is mildly enlarged but stable. Stable tortuosity of the thoracic aorta. Stable large hiatal hernia containing a good portion of the stomach. There is overlying vascular crowding and atelectasis. Streaky right basilar atelectasis is also noted. No definite infiltrates or effusions. No pulmonary edema.  IMPRESSION: 1. Stable cardiac enlargement and large hiatal hernia. 2. Bibasilar atelectasis. Electronically Signed   By: Marijo Sanes M.D.   On: 04/24/2019 10:44   MR ANGIO HEAD WO CONTRAST  Result Date: 04/25/2019 CLINICAL DATA:  Initial evaluation for acute stroke. EXAM: MRA HEAD WITHOUT CONTRAST TECHNIQUE: Angiographic images of the Circle of Willis were obtained using MRA technique without intravenous contrast. COMPARISON:  Prior MRI from 04/24/2019. FINDINGS: ANTERIOR CIRCULATION: Examination moderately degraded by motion artifact. Visualized distal cervical segments of the internal carotid arteries are patent with fairly symmetric antegrade flow. Petrous, cavernous, and supraclinoid ICAs patent without appreciable hemodynamically significant stenosis. ICA termini well perfused. A1 segments patent bilaterally. Normal anterior communicating artery complex. Anterior cerebral arteries patent to their distal aspects without stenosis. No M1 stenosis or occlusion. Grossly negative MCA bifurcations, although evaluation limited by motion. Distal MCA branches fairly well perfused and symmetric. POSTERIOR CIRCULATION: Visualized vertebral arteries patent to the vertebrobasilar junction without stenosis. Left vertebral artery dominant. Neither PICA well visualized. Basilar patent to its distal aspect without stenosis. Superior cerebral arteries patent proximally. Both PCAs primarily supplied via the basilar, although a small right posterior communicating arteries noted. PCAs perfused to  their distal aspects without appreciable stenosis. No obvious intracranial aneurysm on this motion degraded exam. IMPRESSION: 1. Technically limited exam due to motion artifact. 2. Negative intracranial MRA for large vessel occlusion. No proximal high-grade or correctable stenosis identified. Electronically Signed   By: Jeannine Boga M.D.   On: 04/25/2019 19:10   MR BRAIN WO CONTRAST  Result Date: 04/24/2019 CLINICAL DATA:  86 year old male with increasing bilateral lower extremity swelling. Stuttering, lower extremity spasms. EXAM: MRI HEAD WITHOUT CONTRAST TECHNIQUE: Multiplanar, multiecho pulse sequences of the brain and surrounding structures were obtained without intravenous contrast. COMPARISON:  Head CT 09/23/2018. FINDINGS: Brain: Punctate area of abnormal diffusion in the right occipital lobe white matter near the occipital horn on series 4, image 19. Associated mild T2 hyperintensity. No other restricted diffusion or evidence of acute infarction. No chronic cerebral blood products or definite cortical encephalomalacia identified. Axial FLAIR images are degraded by artifact with scattered generally mild for age T2 and FLAIR hyperintensity. T2 heterogeneity in the bilateral deep gray nuclei appears to be combination of perivascular spaces and occasional small chronic lacunar infarcts. Negative brainstem and cerebellum. No midline shift, mass effect, evidence of mass lesion, ventriculomegaly, extra-axial collection or acute intracranial hemorrhage. Cervicomedullary junction and pituitary are within normal limits. The examination had to be discontinued prior to completion due to excessive patient motion. No coronal T2 imaging was obtained. Vascular: Major intracranial vascular flow voids are preserved. Dominant and dolichoectatic left vertebral artery with underlying generalized intracranial arterial tortuosity. Skull and upper cervical spine: Negative visible cervical spine. Normal bone marrow  signal. Sinuses/Orbits: Negative, postoperative changes to both globes. Other: Mastoids are clear. Visible internal auditory structures appear normal. Scalp and face soft tissues appear negative. IMPRESSION: 1. Punctate diffusion abnormality in the right occipital lobe white matter compatible with a tiny acute infarct. However, clinical significance is unclear. There is no associated hemorrhage or mass effect. 2. No other acute intracranial abnormality identified. Generally mild for age signal changes elsewhere in the brain compatible with chronic small vessel disease. Electronically Signed   By: Genevie Ann M.D.   On: 04/24/2019 19:35   MR LUMBAR SPINE WO CONTRAST  Result Date: 04/17/2019 [facility] NE[facility] REPORT STUDY DATE: 04/16/19 PATIENT NAME: Ryan [name] DOB: 1931/09/15 MRN: [id number] ORDERING CLINICIAN: Kathrynn Ducking,  MD CLINICAL HISTORY: 86 year old male with lower extremity pain. EXAM: MR LUMBAR SPINE WO CONTRAST TECHNIQUE: MRI of the lumbar spine was obtained utilizing 4 mm sagittal slices from Z61-09 down to the lower sacrum with T1, T2 and inversion recovery views. In addition 4 mm axial slices from U0-4 down to L5-S1 level were included with T1 and T2 weighted views. CONTRAST: none COMPARISON: 12/08/13 IMAGING SITE: [facility] [address] (1.5 Tesla MRI)  FINDINGS: On sagittal views the vertebral bodies have normal height and alignment.  Rotoscoliosis convex right centered at L2-3.  Degenerative spondylosis and disc bulging with anterior and posterior osteophytic projections from L1 down to L4-5.  The conus medullaris terminates at the level of L1.  On axial views: T12-L1:  disc bulging with no spinal stenosis or foraminal narrowing. L1-2: disc bulging and facet hypertrophy with mild left foraminal stenosis. L2-3: disc bulging and facet hypertrophy with moderate left foraminal stenosis and left lateral recess stenosis. L3-4: disc bulging and facet  hypertrophy with severe biforaminal stenosis. L4-5: disc bulging and facet hypertrophy with severe right foraminal stenosis. L5-S1: disc bulging and facet hypertrophy with severe right foraminal stenosis; right laminectomy noted. Limited views of the aorta, kidneys, iliopsoas muscles and sacroiliac joints are notable for right renal cyst measuring 2.6cm.   MRI lumbar spine (without) demonstrating: - Progressive degenerative spondylosis from L1-L5. Rotoscoliosis convex right centered at L2-3.  - At L3-4: disc bulging and facet hypertrophy with severe biforaminal stenosis. - At L4-5: disc bulging and facet hypertrophy with severe right foraminal stenosis. - At L5-S1: disc bulging and facet hypertrophy with severe right foraminal stenosis; right laminectomy noted. - At L2-3: disc bulging and facet hypertrophy with moderate left foraminal stenosis and left lateral recess stenosis. INTERPRETING PHYSICIAN: Penni Bombard, MD Certified in Neurology, Neurophysiology and Neuroimaging [facility] [address], [place], [place] [address] [phone]   EEG adult  Result Date: 04/25/2019 Lora Havens, MD     04/25/2019  2:07 PM Patient Name: Ryan [name] MRN: [id number] Epilepsy Attending: Lora Havens Referring Physician/Provider: Dr. Rosalin Hawking Date: 04/25/2019 Duration: 27.57 minutes Patient history: 86 year old male with intermittent stuttering speech associated with body shaking/leg jerking movements.  EEG to evaluate for seizures. Level of alertness: Awake, drowsy AEDs during EEG study: None Technical aspects: This EEG study was done with scalp electrodes positioned according to the 10-20 International system of electrode placement. Electrical activity was acquired at a sampling rate of _0  and reviewed with a high frequency filter of _1  and a low frequency filter of _2 . EEG data were recorded continuously and digitally stored. Description: The posterior dominant rhythm  consists of 9 Hz activity of moderate voltage (25-35 uV) seen predominantly in posterior head regions, symmetric and reactive to eye opening and eye closing. Drowsiness was characterized by attenuation of the posterior background rhythm.  Single sharp transient was seen in right temporal region. Physiologic photic driving was seen during photic stimulation.  Hyperventilation was not performed.  Single EKG strip showed arrhythmia and frequent PVCs. IMPRESSION: This study is within normal limits. No seizures or epileptiform discharges were seen throughout the recording. However, only wakefulness and drowsiness were recorded. If suspicion for interictal activity remains a concern, a prolonged study including sleep should be considered. Lora Havens   ECHOCARDIOGRAM COMPLETE  Result Date: 04/25/2019    ECHOCARDIOGRAM REPORT   Patient Name:   Ryan [name] Date of Exam: 04/25/2019 Medical Rec #:  [id number]  Height:       67.0 in Accession #:    [id number]      Weight:       190.3 lb Date of Birth:  02/03/1931      BSA:          1.980 m Patient Age:    52 years        BP:           111/64 mmHg Patient Gender: M               HR:           84 bpm. Exam Location:  Inpatient Procedure: 2D Echo, Cardiac Doppler and Color Doppler Indications:    Stroke 434.91 / I163.9  History:        Patient has prior history of Echocardiogram examinations, most                 recent 07/20/2018. CHF, COPD; Risk Factors:Hypertension,                 Diabetes, Dyslipidemia and GERD. Edema. Lymphoma.  Sonographer:    Jonelle Sidle Dance Referring Phys: [place]  1. Left ventricular ejection fraction, by estimation, is 60 to 65%. The left ventricle has normal function. The left ventricle has no regional wall motion abnormalities. There is severe left ventricular hypertrophy. Left ventricular diastolic parameters  were normal.  2. Right ventricular systolic function is normal. The right ventricular size is  normal. There is normal pulmonary artery systolic pressure.  3. Left atrial size was moderately dilated.  4. The mitral valve is normal in structure. No evidence of mitral valve regurgitation. No evidence of mitral stenosis.  5. The aortic valve is tricuspid. Aortic valve regurgitation is trivial. No aortic stenosis is present.  6. The inferior vena cava is normal in size with greater than 50% respiratory variability, suggesting right atrial pressure of 3 mmHg. FINDINGS  Left Ventricle: Left ventricular ejection fraction, by estimation, is 60 to 65%. The left ventricle has normal function. The left ventricle has no regional wall motion abnormalities. The left ventricular internal cavity size was normal in size. There is  severe left ventricular hypertrophy. Left ventricular diastolic parameters were normal. Right Ventricle: The right ventricular size is normal. No increase in right ventricular wall thickness. Right ventricular systolic function is normal. There is normal pulmonary artery systolic pressure. The tricuspid regurgitant velocity is 2.51 m/s, and  with an assumed right atrial pressure of 3 mmHg, the estimated right ventricular systolic pressure is 74.2 mmHg. Left Atrium: Left atrial size was moderately dilated. Right Atrium: Right atrial size was normal in size. Pericardium: There is no evidence of pericardial effusion. Mitral Valve: The mitral valve is normal in structure. Normal mobility of the mitral valve leaflets. No evidence of mitral valve regurgitation. No evidence of mitral valve stenosis. Tricuspid Valve: The tricuspid valve is normal in structure. Tricuspid valve regurgitation is not demonstrated. No evidence of tricuspid stenosis. Aortic Valve: The aortic valve is tricuspid. . There is mild thickening and mild calcification of the aortic valve. Aortic valve regurgitation is trivial. No aortic stenosis is present. There is mild thickening of the aortic valve. There is mild calcification of the  aortic valve. Pulmonic Valve: The pulmonic valve was normal in structure. Pulmonic valve regurgitation is mild. No evidence of pulmonic stenosis. Aorta: The aortic root is normal in size and structure. Venous: The inferior vena cava is normal in size with greater than  50% respiratory variability, suggesting right atrial pressure of 3 mmHg. IAS/Shunts: No atrial level shunt detected by color flow Doppler.  LEFT VENTRICLE PLAX 2D LVIDd:         3.15 cm LVIDs:         2.24 cm LV PW:         0.97 cm LV IVS:        1.65 cm LVOT diam:     2.20 cm LV SV:         103 LV SV Index:   52 LVOT Area:     3.80 cm  RIGHT VENTRICLE             IVC RV Basal diam:  2.65 cm     IVC diam: 1.94 cm RV S prime:     20.30 cm/s TAPSE (M-mode): 1.9 cm LEFT ATRIUM             Index       RIGHT ATRIUM           Index LA diam:        4.40 cm 2.22 cm/m  RA Area:     14.80 cm LA Vol (A2C):   65.8 ml 33.24 ml/m RA Volume:   33.80 ml  17.07 ml/m LA Vol (A4C):   48.5 ml 24.50 ml/m LA Biplane Vol: 57.5 ml 29.04 ml/m  AORTIC VALVE LVOT Vmax:   156.00 cm/s LVOT Vmean:  97.700 cm/s LVOT VTI:    0.270 m  AORTA Ao Root diam: 3.40 cm Ao Asc diam:  3.30 cm MITRAL VALVE               TRICUSPID VALVE MV Area (PHT): 2.54 cm    TR Peak grad:   25.2 mmHg MV Decel Time: 299 msec    TR Vmax:        251.00 cm/s MV E velocity: 81.00 cm/s MV A velocity: 76.00 cm/s  SHUNTS MV E/A ratio:  1.07        Systemic VTI:  0.27 m                            Systemic Diam: 2.20 cm Jenkins Rouge MD Electronically signed by Jenkins Rouge MD Signature Date/Time: 04/25/2019/10:52:16 AM    Final    VAS US CAROTID (at [facility] and [facility] only)  Result Date: 04/25/2019 Carotid Arterial Duplex Study Indications:       CVA. Comparison Study:  No prior study Performing Technologist: Maudry Mayhew MHA, RDMS, RVT, RDCS  Examination Guidelines: A complete evaluation includes B-mode imaging, spectral Doppler, color Doppler, and power Doppler as needed of all accessible portions of each  vessel. Bilateral testing is considered an integral part of a complete examination. Limited examinations for reoccurring indications may be performed as noted.  Right Carotid Findings: +----------+--------+--------+--------+-----------------------+--------+           PSV cm/sEDV cm/sStenosisPlaque Description     Comments +----------+--------+--------+--------+-----------------------+--------+ CCA Prox  80      17                                              +----------+--------+--------+--------+-----------------------+--------+ CCA Distal81      16              smooth and heterogenous         +----------+--------+--------+--------+-----------------------+--------+  ICA Prox  150     50      40-59%  smooth and homogeneous          +----------+--------+--------+--------+-----------------------+--------+ ICA Distal76      17                                              +----------+--------+--------+--------+-----------------------+--------+ ECA       93      12              smooth and heterogenous         +----------+--------+--------+--------+-----------------------+--------+ +----------+--------+-------+----------------+-------------------+           PSV cm/sEDV cmsDescribe        Arm Pressure (mmHG) +----------+--------+-------+----------------+-------------------+ [id number]             Multiphasic, WNL                    +----------+--------+-------+----------------+-------------------+ +---------+--------+--------+--------------+ VertebralPSV cm/sEDV cm/sNot identified +---------+--------+--------+--------------+  Left Carotid Findings: +----------+--------+--------+--------+-----------------------+--------+           PSV cm/sEDV cm/sStenosisPlaque Description     Comments +----------+--------+--------+--------+-----------------------+--------+ CCA Prox  145     29                                               +----------+--------+--------+--------+-----------------------+--------+ CCA Distal66      18              smooth and heterogenous         +----------+--------+--------+--------+-----------------------+--------+ ICA Prox  94      20              smooth and heterogenous         +----------+--------+--------+--------+-----------------------+--------+ ICA Distal92      21                                              +----------+--------+--------+--------+-----------------------+--------+ ECA       97      10              smooth and heterogenous         +----------+--------+--------+--------+-----------------------+--------+ +----------+--------+--------+----------------+-------------------+           PSV cm/sEDV cm/sDescribe        Arm Pressure (mmHG) +----------+--------+--------+----------------+-------------------+ [id number]              Multiphasic, WNL                    +----------+--------+--------+----------------+-------------------+ +---------+--------+--+--------+-+---------+ VertebralPSV cm/s52EDV cm/s9Antegrade +---------+--------+--+--------+-+---------+   Summary: Right Carotid: Velocities in the right ICA are consistent with a 40-59%                stenosis. Left Carotid: Velocities in the left ICA are consistent with a 1-39% stenosis. Vertebrals:  Left vertebral artery demonstrates antegrade flow. Right vertebral              artery was not visualized. Subclavians: Normal flow hemodynamics were seen in bilateral subclavian              arteries. *See table(s) above for measurements and observations.  Electronically signed  by Ruta Hinds MD on 04/25/2019 at 3:59:50 PM.    Final     EKG:SR, iRBBB (personally reviewed)  TELEMETRY: SR with frequent PAC's (personally reviewed)   Assessment/Plan: 1.  Right occipital lobe infarct Concern for AF with PAH  Risks, benefits to ILR implant reviewed with patient who wishes to proceed. Will plan for  later today with Dr Lovena Le.       For questions or updates, please contact [place] Please consult [email or web address] for contact info under Cardiology/STEMI.  Signed, Chanetta Marshall, NP 04/26/2019 9:25 AM   EP attending  Patient seen and examined.  Agree with the findings as noted above.  The patient has a history of lung disease and pulmonary hypertension who presented with a cryptogenic stroke.  He has had a nice neurologic recovery.  His dyspnea is at baseline.  We are asked to insert an implantable loop recorder for A. fib monitoring.  His exam is accurately documented above.  I have discussed the treatment options with the patient.  The risks, goals, benefits, and expectations of implantable loop recorder insertion were reviewed and the patient wishes to proceed.  Cristopher Peru, MD

## 2019-04-26 NOTE — TOC Initial Note (Signed)
Transition of Care [facility]) - Initial/Assessment Note    Patient Details  Name: Ryan [name] MRN: [id number] Date of Birth: [birth year]  [facility]) CM/SW Contact:    Geralynn Ochs, LCSW Phone Number: 04/26/2019, 12:35 PM  Clinical Narrative:   CSW noted recommendations from PT/OT for home health. CSW contacted [place] with [facility] [employer], patient was already receiving therapy and will need outpatient orders to resume therapies. CSW faxed orders to [facility] to resume care. CSW updated patient that resumption orders were sent for therapy. Patient identified no other needs for discharge at this time.          Expected Discharge Plan: [place] Barriers to Discharge: Continued Medical Work up   Patient Goals and CMS Choice Patient states their goals for this hospitalization and ongoing recovery are:: to figure out what's wrong with me so it can get fixed CMS Medicare.gov Compare Post Acute Care list provided to:: Patient Choice offered to / list presented to : Patient  Expected Discharge Plan and Services Expected Discharge Plan: [place] Choice: Durable Medical Equipment, Home Health Living arrangements for the past 2 months: [place]: PT [facility] Agency: Other - See comment([facility]) Date HH Agency Contacted: 04/26/19   Representative spoke with at [place]: Rollene Fare  Prior Living Arrangements/Services Living arrangements for the past 2 months: Materials engineer Lives with:: Self Patient language and need for interpreter reviewed:: No Do you feel safe going back to the place where you live?: Yes      Need for Family Participation in Patient Care: No (Comment) Care giver support system in place?: Yes (comment) Current home services: DME, Home OT, Home PT Criminal Activity/Legal Involvement Pertinent to Current Situation/Hospitalization:  No - Comment as needed  Activities of Daily Living Home Assistive Devices/Equipment: Environmental consultant (specify type), Electric scooter, Oxygen ADL Screening (condition at time of admission) Patient's cognitive ability adequate to safely complete daily activities?: Yes Is the patient deaf or have difficulty hearing?: Yes Does the patient have difficulty seeing, even when wearing glasses/contacts?: No Does the patient have difficulty concentrating, remembering, or making decisions?: No Patient able to express need for assistance with ADLs?: No Does the patient have difficulty dressing or bathing?: No Independently performs ADLs?: Yes (appropriate for developmental age) Weakness of Arms/Hands: None  Permission Sought/Granted                  Emotional Assessment Appearance:: Appears stated age Attitude/Demeanor/Rapport: Engaged Affect (typically observed): Pleasant Orientation: : Oriented to Self, Oriented to Place, Oriented to  Time, Oriented to Situation      Admission diagnosis:  Acute ischemic stroke ([place]) [I63.9] Acute CVA (cerebrovascular accident) [facility]) [I63.9] Patient Active Problem List   Diagnosis Date Noted  . Acute CVA (cerebrovascular accident) ([place]) 04/24/2019  . Diabetes mellitus type 2 in nonobese ([place]) 04/24/2019  . Acute respiratory failure with hypoxia and hypercarbia ([place]) 09/20/2018  . CKD (chronic kidney disease) stage 3, GFR 30-59 ml/min 09/20/2018  . Lethargy 09/20/2018  . Acute metabolic encephalopathy 56/21/3086  . CHF (congestive heart failure) ([place]) 09/20/2018  . Acute kidney injury superimposed on CKD ([place]) 09/10/2018  . Pulmonary hypertension, unspecified ([place]) 09/10/2018  . Multifocal atrial tachycardia ([place]) 09/10/2018  . CHF exacerbation ([place]) 09/07/2018  .  Leg pain 09/07/2018  . Hyperkalemia 07/26/2018  . Acute on chronic diastolic CHF (congestive heart failure) ([place])   . Bilateral lower extremity edema 02/02/2017  . Pulmonary fibrosis ([place])  11/22/2016  . Blurry vision, bilateral 10/26/2016  . COPD ([place]) 07/05/2016  . Exertional dyspnea 09/10/2015  . Fatigue/malaise 08/19/2015  . Asthma/COPD 02/12/2015  . 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 Hypoxemia 01/05/2014  . Essential hypertension 01/05/2014  . Hypokalemia 01/05/2014  . Urinary retention 01/05/2014  . Hypoxia   . Lumbar radiculopathy 12/31/2013  . RLS (restless legs syndrome) 09/21/2012  . Chronic lower back pain 06/15/2012  . Abdominal pain, unspecified site 05/19/2012  . Unspecified deficiency anemia 04/07/2012  . Melanoma ([place])   . Non Hodgkin's lymphoma ([place])   . Periaortic lymphadenopathy 02/16/2011  . Barrett's esophagus 07/09/2010  . Personal history of colonic polyps 05/28/2010  . History of IBS 05/28/2010  . Diverticulosis of colon (without mention of hemorrhage) 05/28/2010  . Arthritis involving multiple sites 05/28/2010  . Wheat intolerance 05/28/2010  . Chronic venous insufficiency 08/19/2009  . PEDAL EDEMA 08/19/2009  . INSOMNIA, CHRONIC 12/24/2008  . EUSTACHIAN TUBE DYSFUNCTION, BILATERAL 12/23/2008  . ERECTILE DYSFUNCTION, ORGANIC 08/01/2008  . Allergic rhinitis with a nonallergic component 05/20/2008  . VENTRAL HERNIA 02/12/2008  . PALPITATIONS 12/25/2007  . LACTOSE INTOLERANCE 10/17/2007  . Hyperlipidemia 09/14/2006  . BPH (benign prostatic hyperplasia) 09/14/2006  . OSTEOARTHRITIS 09/14/2006  . HEART MURMUR, HX OF 09/14/2006   PCP:  Roetta Sessions, NP Pharmacy:   [facility], [place] [address] [place] [address] Phone: [phone] Fax: [phone]  [facility] [place], [place] - [address] [place] [address] Phone: [phone] Fax: [phone]     Social Determinants of Health (SDOH) Interventions    Readmission Risk Interventions Readmission Risk Prevention Plan 09/26/2018 09/26/2018 09/12/2018   Transportation Screening Complete - Complete  PCP or Specialist Appt within 3-5 Days - - Complete  HRI or [place] - - Complete  Social Work Consult for [place] Planning/Counseling - - Complete  Palliative Care Screening - - Not Applicable  Medication Review Press photographer) Complete - Complete  PCP or Specialist appointment within 3-5 days of discharge Complete - -  [place] or Home Care Consult Complete - -  SW Recovery Care/Counseling Consult Complete - -  Palliative Care Screening Complete Complete -  Skilled Nursing Facility Complete Complete -  Some recent data might be hidden

## 2019-04-27 ENCOUNTER — Inpatient Hospital Stay (HOSPITAL_COMMUNITY): Payer: Medicare HMO

## 2019-04-27 DIAGNOSIS — N1831 Chronic kidney disease, stage 3a: Secondary | ICD-10-CM

## 2019-04-27 DIAGNOSIS — R609 Edema, unspecified: Secondary | ICD-10-CM

## 2019-04-27 LAB — BASIC METABOLIC PANEL
Anion gap: 13 (ref 5–15)
BUN: 63 mg/dL — ABNORMAL HIGH (ref 8–23)
CO2: 33 mmol/L — ABNORMAL HIGH (ref 22–32)
Calcium: 8.7 mg/dL — ABNORMAL LOW (ref 8.9–10.3)
Chloride: 90 mmol/L — ABNORMAL LOW (ref 98–111)
Creatinine, Ser: 1.98 mg/dL — ABNORMAL HIGH (ref 0.61–1.24)
GFR calc Af Amer: 35 mL/min — ABNORMAL LOW (ref >60)
GFR calc non Af Amer: 30 mL/min — ABNORMAL LOW (ref >60)
Glucose, Bld: 117 mg/dL — ABNORMAL HIGH (ref 70–99)
Potassium: 3.9 mmol/L (ref 3.5–5.1)
Sodium: 136 mmol/L (ref 135–145)

## 2019-04-27 LAB — IMMUNOFIXATION, URINE

## 2019-04-27 LAB — GLUCOSE, CAPILLARY
Glucose-Capillary: 90 mg/dL (ref 70–99)
Glucose-Capillary: 120 mg/dL — ABNORMAL HIGH (ref 70–99)

## 2019-04-27 MED ORDER — ASPIRIN 325 MG PO TABS: 325.0000 mg | ORAL_TABLET | Freq: Every day | ORAL | 1 refills | Status: DC

## 2019-04-27 MED ORDER — PNEUMOCOCCAL VAC POLYVALENT 25 MCG/0.5ML IJ INJ: 0.5000 mL | INJECTION | INTRAMUSCULAR | Status: DC

## 2019-04-27 MED ORDER — ATORVASTATIN CALCIUM 40 MG PO TABS: 40.0000 mg | ORAL_TABLET | Freq: Every day | ORAL | 1 refills | Status: DC

## 2019-04-27 NOTE — TOC Transition Note (Signed)
Transition of Care [facility]) - CM/SW Discharge Note   Patient Details  Name: Ryan [name] MRN: [id number] Date of Birth: 09/14/1931  [facility]) CM/SW Contact:  Geralynn Ochs, LCSW Phone Number: 04/27/2019, 1:43 PM   Clinical Narrative:   CSW alerted [facility] that patient is discharging home today so they can restart therapies. No further needs at this time, patient has transportation home.    Final next level of care: [place] Barriers to Discharge: Barriers Resolved   Patient Goals and CMS Choice Patient states their goals for this hospitalization and ongoing recovery are:: to figure out what's wrong with me so it can get fixed CMS Medicare.gov Compare Post Acute Care list provided to:: Patient Choice offered to / list presented to : Patient  Discharge Placement                       Discharge Plan and Services     Post Acute Care Choice: Durable Medical Equipment, Home Health                    HH Arranged: PT [facility] Agency: Other - See comment([facility]) Date HH Agency Contacted: 04/26/19   Representative spoke with at [place]: [place] ([place]) Interventions     Readmission Risk Interventions Readmission Risk Prevention Plan 09/26/2018 09/26/2018 09/12/2018  Transportation Screening Complete - Complete  PCP or Specialist Appt within 3-5 Days - - Complete  HRI or [place] - - Complete  Social Work Consult for [place] Planning/Counseling - - Complete  Palliative Care Screening - - Not Applicable  Medication Review Press photographer) Complete - Complete  PCP or Specialist appointment within 3-5 days of discharge Complete - -  [place] or Home Care Consult Complete - -  SW Recovery Care/Counseling Consult Complete - -  Palliative Care Screening Complete Complete -  Skilled Nursing Facility Complete Complete -  Some recent data might be hidden

## 2019-04-27 NOTE — Discharge Summary (Signed)
Physician Discharge Summary  AHMOD GILLESPIE UEA:[id number] DOB: 07/24/1931 DOA: 04/24/2019  PCP: Roetta Sessions, NP  Admit date: 04/24/2019 Discharge date: 04/27/2019  Admitted From: HOME  Disposition:  HOME.   Recommendations for Outpatient Follow-up:  1. Follow up with PCP in 1-2 weeks 2. Please obtain BMP/CBC in one week 3. Please follow up with neurology in 4 weeks.  4. Please follow up with cardiology as recommended.     Discharge Condition: Guarded.  CODE STATUS: Full code.  Diet recommendation: Heart Healthy   Brief/Interim Summary: 86 year old male with prior history of COPD lymphoma, s/p radiation treatment, type 2 diabetes mellitus, stage III CKD, presents with stuttering speech for the last 7 to 10 days in addition to abnormal movements of the lower extremities.  Initial MRI shows diffuse abnormality in the right occipital area concerning for acute CVA.  Neurology was consulted and patient was admitted for evaluation of stroke.    Discharge Diagnoses:  Principal Problem:   Acute CVA (cerebrovascular accident) [facility]) Active Problems:   Non Hodgkin's lymphoma ([place])   Essential hypertension   Acute on chronic diastolic CHF (congestive heart failure) ([place])   Pulmonary hypertension, unspecified (HCC)   CKD (chronic kidney disease) stage 3, GFR 30-59 ml/min   Diabetes mellitus type 2 in nonobese (HCC)  Acute punctuate infarcts in the right occipital area territory :  Admitted for evaluation of stroke. MRA of the head and neck does not show any significant stenosis.  Echocardiogram showed Left ventricular ejection fraction, by estimation, is 60 to 65% without any regional wall abnormalities. Left ventricular diastolic parameters were normal.   Carotid duplex ordered showed  right ICA  consistent with a 40-59% stenosis.  Left ICA is 1 to 39% stenosis.  A1c is 5.9, LDL is 103. Continue with aspirin 325 mg daily, started the patient on atorvastatin 40 mg  daily. Therapy evaluations recommending home.  Neurology on board and appreciate recommendations.  EEG does not show any epileptiform activity during wake and drowsy states.   loop recorder placement today and venous duplex of the lower extremities are negative for DVT.  Chronic diastolic heart failure Slightly fluid overloaded with worsening pedal edema, he was started on IV lasix yesterday and he diuresed about 2.2 lit since admission, transitioned to oral torsemide 80 mg twice daily, spironolactone 25 mg daily and metolazone 2.5 mg once weekly. Check strict intake and output and daily weights. Pt at baseline on 2lit of [place] oxygen and denies any sob or respiratory issues.   Pulmonary hypertension Resume  Adempas and Slexipag.   Abnormal movements of the lower extremities, with myoclonus:  h/o bilateral polyneuropathy in lower extremities and spinal stenosis.Concern for amyloid.   Multiple Myeloma panel + urine immunofixationordered, recommend outpatient follow up with PCP.  Differential include RLS vs ?anxiety induced .  Did not have any episodes in the last 24 hours. Continue with ropinirole as per neurology and outpatient follow up.    Non-Hodgkin's lymphoma S/p radiation treatment.  Recommend outpatient follow-up with oncology as scheduled.    Mild normocytic anemia Hemoglobin around 11.  Continue to monitor.   Type 2 diabetes mellitus Hemoglobin A1c is 5.9,'s  Hold glipizide for now and follow up with PCP regarding starting oral meds.    Stage 3 CKD Slight worsening of creatinine from 1.7 to 2 probably from IV diuresis.   repeat BMP shows improvement in creatinine. Recommend starting diuretics from 3. 27/21.   Obesity: Body mass index is 28.96 kg/m.  Discharge Instructions  Discharge Instructions    Ambulatory referral to Neurology   Complete by: As directed    An appointment is requested in approximately: 4 weeks   Ambulatory referral to  Occupational Therapy   Complete by: As directed    Ambulatory referral to Physical Therapy   Complete by: As directed    Diet - low sodium heart healthy   Complete by: As directed    Discharge instructions   Complete by: As directed    Please follow up with PCP in one week.  Please follow up with cardiology as recommended.  Please follow up with Neurology in 4 weeks.  Please follow up with Dr Lovena Le as recommended for loop recorder     Allergies as of 04/27/2019      Reactions   Pollen Extract-tree Extract Other (See Comments)   HEADACHES, TIRED , DRAINAGE FROM SINUSES   Molds & Smuts Other (See Comments)   Also dust mites causes sinus infections, h/a etc.      Medication List    STOP taking these medications   diazepam 5 MG tablet Commonly known as: VALIUM   glipiZIDE 5 MG tablet Commonly known as: GLUCOTROL   methocarbamol 500 MG tablet Commonly known as: ROBAXIN     TAKE these medications   Adempas 1.5 MG Tabs Generic drug: Riociguat Take 1.5 mg by mouth 3 (three) times daily.   allopurinol 100 MG tablet Commonly known as: ZYLOPRIM Take 50 mg by mouth daily.   aspirin 325 MG tablet Take 1 tablet (325 mg total) by mouth daily. Start taking on: April 28, 2019   atorvastatin 40 MG tablet Commonly known as: LIPITOR Take 1 tablet (40 mg total) by mouth daily at 6 PM.   diltiazem 360 MG 24 hr capsule Commonly known as: CARDIZEM CD Take 1 capsule (360 mg total) by mouth daily.   escitalopram 10 MG tablet Commonly known as: LEXAPRO Take 10 mg by mouth daily.   finasteride 5 MG tablet Commonly known as: PROSCAR Take 1 tablet (5 mg total) by mouth daily. What changed: when to take this   Flonase Sensimist 27.5 MCG/SPRAY nasal spray Generic drug: fluticasone Place 1 spray into the nose daily as needed for rhinitis or allergies.   ipratropium 0.03 % nasal spray Commonly known as: ATROVENT Place 2 sprays into both nostrils 2 (two) times daily.    latanoprost 0.005 % ophthalmic solution Commonly known as: XALATAN Place 1 drop into both eyes at bedtime.   metolazone 2.5 MG tablet Commonly known as: ZAROXOLYN Take 1 tablet (2.5 mg total) by mouth once a week. Every Wednesday What changed: additional instructions   omeprazole 20 MG capsule Commonly known as: PRILOSEC Take 20 mg by mouth daily.   potassium chloride 10 MEQ tablet Commonly known as: KLOR-CON Take 10 mEq by mouth daily. What changed: Another medication with the same name was removed. Continue taking this medication, and follow the directions you see here.   potassium chloride SA 20 MEQ tablet Commonly known as: KLOR-CON Take 1 tablet by mouth in the morning, at noon, in the evening, and at bedtime.   rOPINIRole 2 MG tablet Commonly known as: Requip Take 1 tablet (2 mg total) by mouth in the morning, at noon, and at bedtime.   Selexipag 1600 MCG Tabs Take 1,600 mcg by mouth 2 (two) times daily.   spironolactone 25 MG tablet Commonly known as: ALDACTONE Take 1 tablet (25 mg total) by mouth daily.   torsemide 20  MG tablet Commonly known as: DEMADEX Take 4 tablets (80 mg total) by mouth 2 (two) times daily.   traMADol 50 MG tablet Commonly known as: ULTRAM Take 50 mg by mouth every 6 (six) hours as needed for moderate pain.   traZODone 100 MG tablet Commonly known as: DESYREL Take 100 mg by mouth at bedtime.      Follow-up Information    Kathrynn Ducking, MD. Schedule an appointment as soon as possible for a visit in 4 week(s).   Specialty: Neurology Contact information: [address] [place] [address] [phone]          Allergies  Allergen Reactions  . Pollen Extract-Tree Extract Other (See Comments)    HEADACHES, TIRED , DRAINAGE FROM SINUSES  . Molds & Smuts Other (See Comments)    Also dust mites causes sinus infections, h/a etc.    Consultations:  Neurology  Cardiology.    Procedures/Studies: DG Chest  2 View  Result Date: 04/24/2019 CLINICAL DATA:  Lower extremity edema. EXAM: CHEST - 2 VIEW COMPARISON:  09/20/2018 FINDINGS: The heart is mildly enlarged but stable. Stable tortuosity of the thoracic aorta. Stable large hiatal hernia containing a good portion of the stomach. There is overlying vascular crowding and atelectasis. Streaky right basilar atelectasis is also noted. No definite infiltrates or effusions. No pulmonary edema. IMPRESSION: 1. Stable cardiac enlargement and large hiatal hernia. 2. Bibasilar atelectasis. Electronically Signed   By: Marijo Sanes M.D.   On: 04/24/2019 10:44   MR ANGIO HEAD WO CONTRAST  Result Date: 04/25/2019 CLINICAL DATA:  Initial evaluation for acute stroke. EXAM: MRA HEAD WITHOUT CONTRAST TECHNIQUE: Angiographic images of the Circle of Willis were obtained using MRA technique without intravenous contrast. COMPARISON:  Prior MRI from 04/24/2019. FINDINGS: ANTERIOR CIRCULATION: Examination moderately degraded by motion artifact. Visualized distal cervical segments of the internal carotid arteries are patent with fairly symmetric antegrade flow. Petrous, cavernous, and supraclinoid ICAs patent without appreciable hemodynamically significant stenosis. ICA termini well perfused. A1 segments patent bilaterally. Normal anterior communicating artery complex. Anterior cerebral arteries patent to their distal aspects without stenosis. No M1 stenosis or occlusion. Grossly negative MCA bifurcations, although evaluation limited by motion. Distal MCA branches fairly well perfused and symmetric. POSTERIOR CIRCULATION: Visualized vertebral arteries patent to the vertebrobasilar junction without stenosis. Left vertebral artery dominant. Neither PICA well visualized. Basilar patent to its distal aspect without stenosis. Superior cerebral arteries patent proximally. Both PCAs primarily supplied via the basilar, although a small right posterior communicating arteries noted. PCAs perfused  to their distal aspects without appreciable stenosis. No obvious intracranial aneurysm on this motion degraded exam. IMPRESSION: 1. Technically limited exam due to motion artifact. 2. Negative intracranial MRA for large vessel occlusion. No proximal high-grade or correctable stenosis identified. Electronically Signed   By: Jeannine Boga M.D.   On: 04/25/2019 19:10   MR BRAIN WO CONTRAST  Result Date: 04/24/2019 CLINICAL DATA:  86 year old male with increasing bilateral lower extremity swelling. Stuttering, lower extremity spasms. EXAM: MRI HEAD WITHOUT CONTRAST TECHNIQUE: Multiplanar, multiecho pulse sequences of the brain and surrounding structures were obtained without intravenous contrast. COMPARISON:  Head CT 09/23/2018. FINDINGS: Brain: Punctate area of abnormal diffusion in the right occipital lobe white matter near the occipital horn on series 4, image 19. Associated mild T2 hyperintensity. No other restricted diffusion or evidence of acute infarction. No chronic cerebral blood products or definite cortical encephalomalacia identified. Axial FLAIR images are degraded by artifact with scattered generally mild for age  T2 and FLAIR hyperintensity. T2 heterogeneity in the bilateral deep gray nuclei appears to be combination of perivascular spaces and occasional small chronic lacunar infarcts. Negative brainstem and cerebellum. No midline shift, mass effect, evidence of mass lesion, ventriculomegaly, extra-axial collection or acute intracranial hemorrhage. Cervicomedullary junction and pituitary are within normal limits. The examination had to be discontinued prior to completion due to excessive patient motion. No coronal T2 imaging was obtained. Vascular: Major intracranial vascular flow voids are preserved. Dominant and dolichoectatic left vertebral artery with underlying generalized intracranial arterial tortuosity. Skull and upper cervical spine: Negative visible cervical spine. Normal bone marrow  signal. Sinuses/Orbits: Negative, postoperative changes to both globes. Other: Mastoids are clear. Visible internal auditory structures appear normal. Scalp and face soft tissues appear negative. IMPRESSION: 1. Punctate diffusion abnormality in the right occipital lobe white matter compatible with a tiny acute infarct. However, clinical significance is unclear. There is no associated hemorrhage or mass effect. 2. No other acute intracranial abnormality identified. Generally mild for age signal changes elsewhere in the brain compatible with chronic small vessel disease. Electronically Signed   By: Genevie Ann M.D.   On: 04/24/2019 19:35   MR LUMBAR SPINE WO CONTRAST  Result Date: 04/17/2019 [facility] NEURO[facility] REPORT STUDY DATE: 04/16/19 PATIENT NAME: MATTHEO SWINDLE DOB: [birth year] MRN: [id number] ORDERING CLINICIAN: Kathrynn Ducking, MD CLINICAL HISTORY: 86 year old male with lower extremity pain. EXAM: MR LUMBAR SPINE WO CONTRAST TECHNIQUE: MRI of the lumbar spine was obtained utilizing 4 mm sagittal slices from U44-03 down to the lower sacrum with T1, T2 and inversion recovery views. In addition 4 mm axial slices from K7-4 down to L5-S1 level were included with T1 and T2 weighted views. CONTRAST: none COMPARISON: 12/08/13 IMAGING SITE: [facility] [address] (1.5 Tesla MRI)  FINDINGS: On sagittal views the vertebral bodies have normal height and alignment.  Rotoscoliosis convex right centered at L2-3.  Degenerative spondylosis and disc bulging with anterior and posterior osteophytic projections from L1 down to L4-5.  The conus medullaris terminates at the level of L1.  On axial views: T12-L1:  disc bulging with no spinal stenosis or foraminal narrowing. L1-2: disc bulging and facet hypertrophy with mild left foraminal stenosis. L2-3: disc bulging and facet hypertrophy with moderate left foraminal stenosis and left lateral recess stenosis. L3-4: disc bulging and facet  hypertrophy with severe biforaminal stenosis. L4-5: disc bulging and facet hypertrophy with severe right foraminal stenosis. L5-S1: disc bulging and facet hypertrophy with severe right foraminal stenosis; right laminectomy noted. Limited views of the aorta, kidneys, iliopsoas muscles and sacroiliac joints are notable for right renal cyst measuring 2.6cm.   MRI lumbar spine (without) demonstrating: - Progressive degenerative spondylosis from L1-L5. Rotoscoliosis convex right centered at L2-3.  - At L3-4: disc bulging and facet hypertrophy with severe biforaminal stenosis. - At L4-5: disc bulging and facet hypertrophy with severe right foraminal stenosis. - At L5-S1: disc bulging and facet hypertrophy with severe right foraminal stenosis; right laminectomy noted. - At L2-3: disc bulging and facet hypertrophy with moderate left foraminal stenosis and left lateral recess stenosis. INTERPRETING PHYSICIAN: Penni Bombard, MD Certified in Neurology, Neurophysiology and Neuroimaging [facility] [address] [place], [place] [address] [phone]   NM CARDIAC AMYLOID TUMOR LOC INFLAM SPECT 1 DAY  Result Date: 04/26/2019 CLINICAL DATA:  HEART FAILURE. CONCERN FOR CARDIAC AMYLOIDOSIS. EXAM: NUCLEAR MEDICINE TUMOR LOCALIZATION. PYP CARDIAC AMYLOIDOSIS SCAN WITH SPECT TECHNIQUE: Following intravenous administration of radiopharmaceutical,  anterior planar images of the chest were obtained. Regions of interest were placed on the heart and contralateral chest wall for quantitative assessment. Additional SPECT imaging of the chest was obtained. RADIOPHARMACEUTICALS:  18.8 mCi TECHNETIUM 99 PYROPHOSPHATE FINDINGS: Planar Visual assessment: Anterior planar imaging demonstrates radiotracer uptake within the heart less than than uptake within the adjacent ribs (Grade 0). Quantitative assessment : Quantitative assessment of the cardiac uptake compared to the contralateral chest wall is equal to 1.0  (H/CL = 1.0). SPECT assessment: SPECT imaging of the chest demonstrates no significant radiotracer accumulation within the LEFT ventricle. IMPRESSION: Visual and quantitative assessment (grade 0, H/CLL equal 1.0) are equivocal suggestive of transthyretin amyloidosis. Electronically Signed   By: Kerby Moors M.D.   On: 04/26/2019 16:10   EP PPM/ICD IMPLANT  Result Date: 04/26/2019 CONCLUSIONS:  1. Successful implantation of a [employer] Reveal LINQ implantable loop recorder for cryptogenic stroke  2. No early apparent complications. Cristopher Peru, MD 04/26/2019 5:41 PM   EEG adult  Result Date: 04/25/2019 Lora Havens, MD     04/25/2019  2:07 PM Patient Name: Ryan [name] MRN: [id number] Epilepsy Attending: Lora Havens Referring Physician/Provider: Dr. Rosalin Hawking Date: 04/25/2019 Duration: 27.57 minutes Patient history: 86 year old male with intermittent stuttering speech associated with body shaking/leg jerking movements.  EEG to evaluate for seizures. Level of alertness: Awake, drowsy AEDs during EEG study: None Technical aspects: This EEG study was done with scalp electrodes positioned according to the 10-20 International system of electrode placement. Electrical activity was acquired at a sampling rate of 500Hz and reviewed with a high frequency filter of 70Hz and a low frequency filter of 1Hz. EEG data were recorded continuously and digitally stored. Description: The posterior dominant rhythm consists of 9 Hz activity of moderate voltage (25-35 uV) seen predominantly in posterior head regions, symmetric and reactive to eye opening and eye closing. Drowsiness was characterized by attenuation of the posterior background rhythm.  Single sharp transient was seen in right temporal region. Physiologic photic driving was seen during photic stimulation.  Hyperventilation was not performed.  Single EKG strip showed arrhythmia and frequent PVCs. IMPRESSION: This study is within normal limits. No seizures  or epileptiform discharges were seen throughout the recording. However, only wakefulness and drowsiness were recorded. If suspicion for interictal activity remains a concern, a prolonged study including sleep should be considered. Lora Havens   ECHOCARDIOGRAM COMPLETE  Result Date: 04/25/2019    ECHOCARDIOGRAM REPORT   Patient Name:   KRISTIAN MOGG Date of Exam: 04/25/2019 Medical Rec #:  [id number]       Height:       67.0 in Accession #:    [id number]      Weight:       190.3 lb Date of Birth:  07-11-1931      BSA:          1.980 m Patient Age:    89 years        BP:           111/64 mmHg Patient Gender: M               HR:           84 bpm. Exam Location:  Inpatient Procedure: 2D Echo, Cardiac Doppler and Color Doppler Indications:    Stroke 434.91 / I163.9  History:        Patient has prior history of Echocardiogram examinations, most  recent 07/20/2018. CHF, COPD; Risk Factors:Hypertension,                 Diabetes, Dyslipidemia and GERD. Edema. Lymphoma.  Sonographer:    Jonelle Sidle Dance Referring Phys: [place]  1. Left ventricular ejection fraction, by estimation, is 60 to 65%. The left ventricle has normal function. The left ventricle has no regional wall motion abnormalities. There is severe left ventricular hypertrophy. Left ventricular diastolic parameters  were normal.  2. Right ventricular systolic function is normal. The right ventricular size is normal. There is normal pulmonary artery systolic pressure.  3. Left atrial size was moderately dilated.  4. The mitral valve is normal in structure. No evidence of mitral valve regurgitation. No evidence of mitral stenosis.  5. The aortic valve is tricuspid. Aortic valve regurgitation is trivial. No aortic stenosis is present.  6. The inferior vena cava is normal in size with greater than 50% respiratory variability, suggesting right atrial pressure of 3 mmHg. FINDINGS  Left Ventricle: Left ventricular  ejection fraction, by estimation, is 60 to 65%. The left ventricle has normal function. The left ventricle has no regional wall motion abnormalities. The left ventricular internal cavity size was normal in size. There is  severe left ventricular hypertrophy. Left ventricular diastolic parameters were normal. Right Ventricle: The right ventricular size is normal. No increase in right ventricular wall thickness. Right ventricular systolic function is normal. There is normal pulmonary artery systolic pressure. The tricuspid regurgitant velocity is 2.51 m/s, and  with an assumed right atrial pressure of 3 mmHg, the estimated right ventricular systolic pressure is 11.0 mmHg. Left Atrium: Left atrial size was moderately dilated. Right Atrium: Right atrial size was normal in size. Pericardium: There is no evidence of pericardial effusion. Mitral Valve: The mitral valve is normal in structure. Normal mobility of the mitral valve leaflets. No evidence of mitral valve regurgitation. No evidence of mitral valve stenosis. Tricuspid Valve: The tricuspid valve is normal in structure. Tricuspid valve regurgitation is not demonstrated. No evidence of tricuspid stenosis. Aortic Valve: The aortic valve is tricuspid. . There is mild thickening and mild calcification of the aortic valve. Aortic valve regurgitation is trivial. No aortic stenosis is present. There is mild thickening of the aortic valve. There is mild calcification of the aortic valve. Pulmonic Valve: The pulmonic valve was normal in structure. Pulmonic valve regurgitation is mild. No evidence of pulmonic stenosis. Aorta: The aortic root is normal in size and structure. Venous: The inferior vena cava is normal in size with greater than 50% respiratory variability, suggesting right atrial pressure of 3 mmHg. IAS/Shunts: No atrial level shunt detected by color flow Doppler.  LEFT VENTRICLE PLAX 2D LVIDd:         3.15 cm LVIDs:         2.24 cm LV PW:         0.97 cm LV IVS:         1.65 cm LVOT diam:     2.20 cm LV SV:         103 LV SV Index:   52 LVOT Area:     3.80 cm  RIGHT VENTRICLE             IVC RV Basal diam:  2.65 cm     IVC diam: 1.94 cm RV S prime:     20.30 cm/s TAPSE (M-mode): 1.9 cm LEFT ATRIUM             Index  RIGHT ATRIUM           Index LA diam:        4.40 cm 2.22 cm/m  RA Area:     14.80 cm LA Vol (A2C):   65.8 ml 33.24 ml/m RA Volume:   33.80 ml  17.07 ml/m LA Vol (A4C):   48.5 ml 24.50 ml/m LA Biplane Vol: 57.5 ml 29.04 ml/m  AORTIC VALVE LVOT Vmax:   156.00 cm/s LVOT Vmean:  97.700 cm/s LVOT VTI:    0.270 m  AORTA Ao Root diam: 3.40 cm Ao Asc diam:  3.30 cm MITRAL VALVE               TRICUSPID VALVE MV Area (PHT): 2.54 cm    TR Peak grad:   25.2 mmHg MV Decel Time: 299 msec    TR Vmax:        251.00 cm/s MV E velocity: 81.00 cm/s MV A velocity: 76.00 cm/s  SHUNTS MV E/A ratio:  1.07        Systemic VTI:  0.27 m                            Systemic Diam: 2.20 cm Jenkins Rouge MD Electronically signed by Jenkins Rouge MD Signature Date/Time: 04/25/2019/10:52:16 AM    Final    VAS US CAROTID (at [facility] and [facility] only)  Result Date: 04/25/2019 Carotid Arterial Duplex Study Indications:       CVA. Comparison Study:  No prior study Performing Technologist: Maudry Mayhew MHA, RDMS, RVT, RDCS  Examination Guidelines: A complete evaluation includes B-mode imaging, spectral Doppler, color Doppler, and power Doppler as needed of all accessible portions of each vessel. Bilateral testing is considered an integral part of a complete examination. Limited examinations for reoccurring indications may be performed as noted.  Right Carotid Findings: +----------+--------+--------+--------+-----------------------+--------+           PSV cm/sEDV cm/sStenosisPlaque Description     Comments +----------+--------+--------+--------+-----------------------+--------+ CCA Prox  80      17                                               +----------+--------+--------+--------+-----------------------+--------+ CCA Distal81      16              smooth and heterogenous         +----------+--------+--------+--------+-----------------------+--------+ ICA Prox  150     50      40-59%  smooth and homogeneous          +----------+--------+--------+--------+-----------------------+--------+ ICA Distal76      17                                              +----------+--------+--------+--------+-----------------------+--------+ ECA       93      12              smooth and heterogenous         +----------+--------+--------+--------+-----------------------+--------+ +----------+--------+-------+----------------+-------------------+           PSV cm/sEDV cmsDescribe        Arm Pressure (mmHG) +----------+--------+-------+----------------+-------------------+ [id number]             Multiphasic, WNL                    +----------+--------+-------+----------------+-------------------+ +---------+--------+--------+--------------+  [place] cm/sEDV cm/sNot identified +---------+--------+--------+--------------+  Left Carotid Findings: +----------+--------+--------+--------+-----------------------+--------+           PSV cm/sEDV cm/sStenosisPlaque Description     Comments +----------+--------+--------+--------+-----------------------+--------+ CCA Prox  145     29                                              +----------+--------+--------+--------+-----------------------+--------+ CCA Distal66      18              smooth and heterogenous         +----------+--------+--------+--------+-----------------------+--------+ ICA Prox  94      20              smooth and heterogenous         +----------+--------+--------+--------+-----------------------+--------+ ICA Distal92      21                                               +----------+--------+--------+--------+-----------------------+--------+ ECA       97      10              smooth and heterogenous         +----------+--------+--------+--------+-----------------------+--------+ +----------+--------+--------+----------------+-------------------+           PSV cm/sEDV cm/sDescribe        Arm Pressure (mmHG) +----------+--------+--------+----------------+-------------------+ [id number]              Multiphasic, WNL                    +----------+--------+--------+----------------+-------------------+ +---------+--------+--+--------+-+---------+ VertebralPSV cm/s52EDV cm/s9Antegrade +---------+--------+--+--------+-+---------+   Summary: Right Carotid: Velocities in the right ICA are consistent with a 40-59%                stenosis. Left Carotid: Velocities in the left ICA are consistent with a 1-39% stenosis. Vertebrals:  Left vertebral artery demonstrates antegrade flow. Right vertebral              artery was not visualized. Subclavians: Normal flow hemodynamics were seen in bilateral subclavian              arteries. *See table(s) above for measurements and observations.  Electronically signed by Ruta Hinds MD on 04/25/2019 at 3:59:50 PM.    Final    VAS [place] LOWER EXTREMITY VENOUS (DVT)  Result Date: 04/27/2019  Lower Venous DVTStudy Indications: Stroke.  Performing Technologist: Maudry Mayhew MHA, RDMS, RVT, RDCS  Examination Guidelines: A complete evaluation includes B-mode imaging, spectral Doppler, color Doppler, and power Doppler as needed of all accessible portions of each vessel. Bilateral testing is considered an integral part of a complete examination. Limited examinations for reoccurring indications may be performed as noted. The reflux portion of the exam is performed with the patient in reverse Trendelenburg.  +---------+---------------+---------+-----------+----------+--------------+ RIGHT     CompressibilityPhasicitySpontaneityPropertiesThrombus Aging +---------+---------------+---------+-----------+----------+--------------+ CFV      Full           Yes      Yes                                 +---------+---------------+---------+-----------+----------+--------------+ SFJ      Full                                                        +---------+---------------+---------+-----------+----------+--------------+  FV Prox  Full                                                        +---------+---------------+---------+-----------+----------+--------------+ FV Mid   Full                                                        +---------+---------------+---------+-----------+----------+--------------+ FV DistalFull                                                        +---------+---------------+---------+-----------+----------+--------------+ PFV      Full                                                        +---------+---------------+---------+-----------+----------+--------------+ POP      Full           Yes      Yes                                 +---------+---------------+---------+-----------+----------+--------------+ PTV      Full                                                        +---------+---------------+---------+-----------+----------+--------------+ PERO     Full                                                        +---------+---------------+---------+-----------+----------+--------------+   +---------+---------------+---------+-----------+----------+--------------+ LEFT     CompressibilityPhasicitySpontaneityPropertiesThrombus Aging +---------+---------------+---------+-----------+----------+--------------+ CFV      Full           Yes      Yes                                 +---------+---------------+---------+-----------+----------+--------------+ SFJ      Full                                                         +---------+---------------+---------+-----------+----------+--------------+ FV Prox  Full                                                        +---------+---------------+---------+-----------+----------+--------------+  FV Mid   Full                                                        +---------+---------------+---------+-----------+----------+--------------+ FV DistalFull                                                        +---------+---------------+---------+-----------+----------+--------------+ PFV      Full                                                        +---------+---------------+---------+-----------+----------+--------------+ POP      Full           Yes      Yes                                 +---------+---------------+---------+-----------+----------+--------------+ PTV      Full                                                        +---------+---------------+---------+-----------+----------+--------------+   Left Technical Findings: Not visualized segments include peroneal veins.   Summary: RIGHT: - There is no evidence of deep vein thrombosis in the lower extremity.  - No cystic structure found in the popliteal fossa.  LEFT: - There is no evidence of deep vein thrombosis in the lower extremity. However, portions of this examination were limited- see technologist comments above.  - No cystic structure found in the popliteal fossa.  *See table(s) above for measurements and observations.    Preliminary     (Echo, Carotid, EGD, Colonoscopy, ERCP)    Subjective:   Discharge Exam: Vitals:   04/27/19 0829 04/27/19 1013  BP: (!) 85/56 105/69  Pulse: 84   Resp: 20   Temp: 98.8 F (37.1 C)   SpO2: 97% 96%   Vitals:   04/26/19 2354 04/27/19 0347 04/27/19 0829 04/27/19 1013  BP: (!) 84/65 91/68 (!) 85/56 105/69  Pulse: 81 80 84   Resp: _0 Temp: 97.8 F (36.6 C) 98 F (36.7 C) 98.8 F (37.1 C)   TempSrc:  Oral Oral Oral   SpO2: 95% 97% 97% 96%  Weight:      Height:        General: Pt is alert, awake, not in acute distress Cardiovascular: RRR, S1/S2 +, no rubs, no gallops Respiratory: CTA bilaterally, no wheezing, no rhonchi Abdominal: Soft, NT, ND, bowel sounds + Extremities: no edema, no cyanosis    The results of significant diagnostics from this hospitalization (including imaging, microbiology, ancillary and laboratory) are listed below for reference.     Microbiology: Recent Results (from the past 240 hour(s))  SARS CORONAVIRUS 2 (TAT 6-24 HRS) Nasopharyngeal Nasopharyngeal Swab     Status: None  Collection Time: 04/24/19 10:54 PM   Specimen: Nasopharyngeal Swab  Result Value Ref Range Status   SARS Coronavirus 2 NEGATIVE NEGATIVE Final    Comment: (NOTE) SARS-CoV-2 target nucleic acids are NOT DETECTED. The SARS-CoV-2 RNA is generally detectable in upper and lower respiratory specimens during the acute phase of infection. Negative results do not preclude SARS-CoV-2 infection, do not rule out co-infections with other pathogens, and should not be used as the sole basis for treatment or other patient management decisions. Negative results must be combined with clinical observations, patient history, and epidemiological information. The expected result is Negative. Fact Sheet for Patients: SugarRoll.be Fact Sheet for Healthcare Providers: https://www.woods-mathews.com/ This test is not yet approved or cleared by the [place] FDA and  has been authorized for detection and/or diagnosis of SARS-CoV-2 by FDA under an Emergency Use Authorization (EUA). This EUA will remain  in effect (meaning this test can be used) for the duration of the COVID-19 declaration under Section 56 4(b)(1) of the Act, 21 U.S.C. section 360bbb-3(b)(1), unless the authorization is terminated or revoked sooner. Performed at [facility], [place]  [address], [place], [place] [address]   MRSA PCR Screening     Status: None   Collection Time: 04/25/19  2:04 AM   Specimen: Nasal Mucosa; Nasopharyngeal  Result Value Ref Range Status   MRSA by PCR NEGATIVE NEGATIVE Final    Comment:        The GeneXpert MRSA Assay (FDA approved for NASAL specimens only), is one component of a comprehensive MRSA colonization surveillance program. It is not intended to diagnose MRSA infection nor to guide or monitor treatment for MRSA infections. Performed at [facility], [place] [address], [place],  [address]      Labs: BNP (last 3 results) Recent Labs    10/17/18 1420 12/19/18 1529 03/15/19 1206  BNP 77.6 36.2 95.1   Basic Metabolic Panel: Recent Labs  Lab 04/24/19 1024 04/25/19 0003 04/25/19 0428 04/25/19 1544  NA 134*  --  134* 134*  K 4.6  --  3.8 4.5  CL 93*  --  94* 92*  CO2 28  --  30 29  GLUCOSE 93  --  76 93  BUN 53*  --  46* 50*  CREATININE 2.01* 1.84* 1.76* 2.02*  CALCIUM 8.5*  --  8.2* 8.5*  MG  --   --  2.5* 2.6*   Liver Function Tests: Recent Labs  Lab 04/25/19 0428  AST 19  ALT 22  ALKPHOS 83  BILITOT 0.8  PROT 6.0*  ALBUMIN 3.3*   No results for input(s): LIPASE, AMYLASE in the last 168 hours. No results for input(s): AMMONIA in the last 168 hours. CBC: Recent Labs  Lab 04/24/19 1024 04/25/19 0003  WBC 8.2 7.8  HGB 12.0* 11.6*  HCT 38.2* 36.8*  MCV 91.0 90.9  PLT 288 284   Cardiac Enzymes: No results for input(s): CKTOTAL, CKMB, CKMBINDEX, TROPONINI in the last 168 hours. BNP: Invalid input(s): POCBNP CBG: Recent Labs  Lab 04/26/19 0647 04/26/19 1142 04/26/19 1552 04/26/19 2116 04/27/19 0640  GLUCAP 81 114* 123* 202* 90   D-Dimer No results for input(s): DDIMER in the last 72 hours. Hgb A1c Recent Labs    04/25/19 0428  HGBA1C 5.9*   Lipid Profile Recent Labs    04/25/19 0428  CHOL 164  HDL 35*  LDLCALC 103*  TRIG 132  CHOLHDL 4.7   Thyroid function studies No  results for input(s): TSH,  T4TOTAL, T3FREE, THYROIDAB in the last 72 hours.  Invalid input(s): FREET3 Anemia work up No results for input(s): VITAMINB12, FOLATE, FERRITIN, TIBC, IRON, RETICCTPCT in the last 72 hours. Urinalysis    Component Value Date/Time   COLORURINE STRAW (A) 09/07/2018 1241   APPEARANCEUR CLEAR 09/07/2018 1241   LABSPEC 1.009 09/07/2018 1241   LABSPEC 1.025 06/28/2013 1321   PHURINE 6.0 09/07/2018 1241   GLUCOSEU 50 (A) 09/07/2018 1241   GLUCOSEU Negative 06/28/2013 1321   HGBUR NEGATIVE 09/07/2018 1241   BILIRUBINUR NEGATIVE 09/07/2018 1241   BILIRUBINUR Negative 06/28/2013 1321   KETONESUR NEGATIVE 09/07/2018 1241   PROTEINUR NEGATIVE 09/07/2018 1241   UROBILINOGEN 1.0 01/04/2014 2156   UROBILINOGEN 0.2 06/28/2013 1321   NITRITE NEGATIVE 09/07/2018 1241   LEUKOCYTESUR NEGATIVE 09/07/2018 1241   LEUKOCYTESUR Negative 06/28/2013 1321   Sepsis Labs Invalid input(s): PROCALCITONIN,  WBC,  LACTICIDVEN Microbiology Recent Results (from the past 240 hour(s))  SARS CORONAVIRUS 2 (TAT 6-24 HRS) Nasopharyngeal Nasopharyngeal Swab     Status: None   Collection Time: 04/24/19 10:54 PM   Specimen: Nasopharyngeal Swab  Result Value Ref Range Status   SARS Coronavirus 2 NEGATIVE NEGATIVE Final    Comment: (NOTE) SARS-CoV-2 target nucleic acids are NOT DETECTED. The SARS-CoV-2 RNA is generally detectable in upper and lower respiratory specimens during the acute phase of infection. Negative results do not preclude SARS-CoV-2 infection, do not rule out co-infections with other pathogens, and should not be used as the sole basis for treatment or other patient management decisions. Negative results must be combined with clinical observations, patient history, and epidemiological information. The expected result is Negative. Fact Sheet for Patients: SugarRoll.be Fact Sheet for Healthcare  Providers: https://www.woods-mathews.com/ This test is not yet approved or cleared by the [place] FDA and  has been authorized for detection and/or diagnosis of SARS-CoV-2 by FDA under an Emergency Use Authorization (EUA). This EUA will remain  in effect (meaning this test can be used) for the duration of the COVID-19 declaration under Section 56 4(b)(1) of the Act, 21 U.S.C. section 360bbb-3(b)(1), unless the authorization is terminated or revoked sooner. Performed at [facility], [place] [address], [place], [place] [address]   MRSA PCR Screening     Status: None   Collection Time: 04/25/19  2:04 AM   Specimen: Nasal Mucosa; Nasopharyngeal  Result Value Ref Range Status   MRSA by PCR NEGATIVE NEGATIVE Final    Comment:        The GeneXpert MRSA Assay (FDA approved for NASAL specimens only), is one component of a comprehensive MRSA colonization surveillance program. It is not intended to diagnose MRSA infection nor to guide or monitor treatment for MRSA infections. Performed at [facility], [place] [address], [place], [place] [address]      Time coordinating discharge: 32 minutes.   SIGNED:   Hosie Poisson, MD  [facility] 04/27/2019, 10:54 AM

## 2019-04-27 NOTE — Progress Notes (Signed)
Bilateral lower extremity venous duplex completed. Refer to "CV Proc" under chart review to view preliminary results.  04/27/2019 9:42 AM Kelby Aline., MHA, RVT, RDCS, RDMS

## 2019-04-30 ENCOUNTER — Inpatient Hospital Stay (HOSPITAL_COMMUNITY): Payer: Medicare HMO

## 2019-04-30 ENCOUNTER — Other Ambulatory Visit: Payer: Self-pay

## 2019-04-30 ENCOUNTER — Telehealth: Payer: Self-pay | Admitting: Emergency Medicine

## 2019-04-30 ENCOUNTER — Telehealth (HOSPITAL_COMMUNITY): Payer: Self-pay | Admitting: *Deleted

## 2019-04-30 ENCOUNTER — Emergency Department (HOSPITAL_COMMUNITY): Payer: Medicare HMO

## 2019-04-30 ENCOUNTER — Inpatient Hospital Stay (HOSPITAL_COMMUNITY)
Admission: EM | Admit: 2019-04-30 | Discharge: 2019-05-04 | DRG: 682 | Disposition: A | Discharge status: Home Health | Payer: Medicare HMO | Attending: Student | Admitting: Student

## 2019-04-30 DIAGNOSIS — Z20822 Contact with and (suspected) exposure to covid-19: Secondary | ICD-10-CM | POA: Diagnosis present

## 2019-04-30 DIAGNOSIS — N170 Acute kidney failure with tubular necrosis: Principal | ICD-10-CM | POA: Diagnosis present

## 2019-04-30 DIAGNOSIS — I639 Cerebral infarction, unspecified: Secondary | ICD-10-CM | POA: Diagnosis not present

## 2019-04-30 DIAGNOSIS — Z85820 Personal history of malignant melanoma of skin: Secondary | ICD-10-CM

## 2019-04-30 DIAGNOSIS — I959 Hypotension, unspecified: Secondary | ICD-10-CM | POA: Diagnosis present

## 2019-04-30 DIAGNOSIS — Z7189 Other specified counseling: Secondary | ICD-10-CM | POA: Diagnosis not present

## 2019-04-30 DIAGNOSIS — Z66 Do not resuscitate: Secondary | ICD-10-CM | POA: Diagnosis present

## 2019-04-30 DIAGNOSIS — Z7982 Long term (current) use of aspirin: Secondary | ICD-10-CM

## 2019-04-30 DIAGNOSIS — J9621 Acute and chronic respiratory failure with hypoxia: Secondary | ICD-10-CM | POA: Diagnosis present

## 2019-04-30 DIAGNOSIS — N189 Chronic kidney disease, unspecified: Secondary | ICD-10-CM | POA: Diagnosis not present

## 2019-04-30 DIAGNOSIS — I480 Paroxysmal atrial fibrillation: Secondary | ICD-10-CM | POA: Diagnosis present

## 2019-04-30 DIAGNOSIS — N179 Acute kidney failure, unspecified: Secondary | ICD-10-CM

## 2019-04-30 DIAGNOSIS — C8590 Non-Hodgkin lymphoma, unspecified, unspecified site: Secondary | ICD-10-CM | POA: Diagnosis present

## 2019-04-30 DIAGNOSIS — I272 Pulmonary hypertension, unspecified: Secondary | ICD-10-CM | POA: Diagnosis not present

## 2019-04-30 DIAGNOSIS — G8929 Other chronic pain: Secondary | ICD-10-CM | POA: Diagnosis present

## 2019-04-30 DIAGNOSIS — J449 Chronic obstructive pulmonary disease, unspecified: Secondary | ICD-10-CM | POA: Diagnosis present

## 2019-04-30 DIAGNOSIS — N19 Unspecified kidney failure: Secondary | ICD-10-CM | POA: Diagnosis not present

## 2019-04-30 DIAGNOSIS — Z9981 Dependence on supplemental oxygen: Secondary | ICD-10-CM | POA: Diagnosis not present

## 2019-04-30 DIAGNOSIS — M545 Low back pain: Secondary | ICD-10-CM | POA: Diagnosis not present

## 2019-04-30 DIAGNOSIS — I130 Hypertensive heart and chronic kidney disease with heart failure and stage 1 through stage 4 chronic kidney disease, or unspecified chronic kidney disease: Secondary | ICD-10-CM | POA: Diagnosis present

## 2019-04-30 DIAGNOSIS — N184 Chronic kidney disease, stage 4 (severe): Secondary | ICD-10-CM | POA: Diagnosis present

## 2019-04-30 DIAGNOSIS — I5032 Chronic diastolic (congestive) heart failure: Secondary | ICD-10-CM | POA: Diagnosis present

## 2019-04-30 DIAGNOSIS — I270 Primary pulmonary hypertension: Secondary | ICD-10-CM | POA: Diagnosis present

## 2019-04-30 DIAGNOSIS — R5381 Other malaise: Secondary | ICD-10-CM | POA: Diagnosis not present

## 2019-04-30 DIAGNOSIS — Z79899 Other long term (current) drug therapy: Secondary | ICD-10-CM | POA: Diagnosis not present

## 2019-04-30 DIAGNOSIS — M549 Dorsalgia, unspecified: Secondary | ICD-10-CM | POA: Diagnosis not present

## 2019-04-30 DIAGNOSIS — E871 Hypo-osmolality and hyponatremia: Secondary | ICD-10-CM | POA: Diagnosis present

## 2019-04-30 DIAGNOSIS — E1122 Type 2 diabetes mellitus with diabetic chronic kidney disease: Secondary | ICD-10-CM | POA: Diagnosis present

## 2019-04-30 DIAGNOSIS — E1151 Type 2 diabetes mellitus with diabetic peripheral angiopathy without gangrene: Secondary | ICD-10-CM | POA: Diagnosis present

## 2019-04-30 DIAGNOSIS — T502X5A Adverse effect of carbonic-anhydrase inhibitors, benzothiadiazides and other diuretics, initial encounter: Secondary | ICD-10-CM | POA: Diagnosis present

## 2019-04-30 DIAGNOSIS — E861 Hypovolemia: Secondary | ICD-10-CM | POA: Diagnosis not present

## 2019-04-30 DIAGNOSIS — K219 Gastro-esophageal reflux disease without esophagitis: Secondary | ICD-10-CM | POA: Diagnosis present

## 2019-04-30 DIAGNOSIS — Z87891 Personal history of nicotine dependence: Secondary | ICD-10-CM | POA: Diagnosis not present

## 2019-04-30 DIAGNOSIS — E785 Hyperlipidemia, unspecified: Secondary | ICD-10-CM | POA: Diagnosis present

## 2019-04-30 DIAGNOSIS — Z8673 Personal history of transient ischemic attack (TIA), and cerebral infarction without residual deficits: Secondary | ICD-10-CM | POA: Diagnosis not present

## 2019-04-30 DIAGNOSIS — Z7951 Long term (current) use of inhaled steroids: Secondary | ICD-10-CM | POA: Diagnosis not present

## 2019-04-30 DIAGNOSIS — Z923 Personal history of irradiation: Secondary | ICD-10-CM

## 2019-04-30 DIAGNOSIS — R609 Edema, unspecified: Secondary | ICD-10-CM | POA: Diagnosis present

## 2019-04-30 DIAGNOSIS — J9611 Chronic respiratory failure with hypoxia: Secondary | ICD-10-CM | POA: Diagnosis not present

## 2019-04-30 DIAGNOSIS — Z515 Encounter for palliative care: Secondary | ICD-10-CM | POA: Diagnosis not present

## 2019-04-30 DIAGNOSIS — I6521 Occlusion and stenosis of right carotid artery: Secondary | ICD-10-CM | POA: Diagnosis present

## 2019-04-30 DIAGNOSIS — D631 Anemia in chronic kidney disease: Secondary | ICD-10-CM | POA: Diagnosis not present

## 2019-04-30 DIAGNOSIS — N400 Enlarged prostate without lower urinary tract symptoms: Secondary | ICD-10-CM | POA: Diagnosis present

## 2019-04-30 DIAGNOSIS — Z789 Other specified health status: Secondary | ICD-10-CM | POA: Diagnosis not present

## 2019-04-30 LAB — COMPREHENSIVE METABOLIC PANEL
ALT: 21 U/L (ref 0–44)
AST: 16 U/L (ref 15–41)
Albumin: 4.2 g/dL (ref 3.5–5.0)
Alkaline Phosphatase: 99 U/L (ref 38–126)
Anion gap: 14 (ref 5–15)
BUN: 122 mg/dL — ABNORMAL HIGH (ref 8–23)
CO2: 22 mmol/L (ref 22–32)
Calcium: 7.9 mg/dL — ABNORMAL LOW (ref 8.9–10.3)
Chloride: 89 mmol/L — ABNORMAL LOW (ref 98–111)
Creatinine, Ser: 5.27 mg/dL — ABNORMAL HIGH (ref 0.61–1.24)
GFR calc Af Amer: 11 mL/min — ABNORMAL LOW (ref >60)
GFR calc non Af Amer: 9 mL/min — ABNORMAL LOW (ref >60)
Glucose, Bld: 116 mg/dL — ABNORMAL HIGH (ref 70–99)
Potassium: 5 mmol/L (ref 3.5–5.1)
Sodium: 125 mmol/L — ABNORMAL LOW (ref 135–145)
Total Bilirubin: 1.3 mg/dL — ABNORMAL HIGH (ref 0.3–1.2)
Total Protein: 7.5 g/dL (ref 6.5–8.1)

## 2019-04-30 LAB — CBC WITH DIFFERENTIAL/PLATELET
Abs Immature Granulocytes: 0.06 10*3/uL (ref 0.00–0.07)
Basophils Absolute: 0 10*3/uL (ref 0.0–0.1)
Basophils Relative: 0 %
Eosinophils Absolute: 0 10*3/uL (ref 0.0–0.5)
Eosinophils Relative: 0 %
HCT: 38.4 % — ABNORMAL LOW (ref 39.0–52.0)
Hemoglobin: 12.5 g/dL — ABNORMAL LOW (ref 13.0–17.0)
Immature Granulocytes: 1 %
Lymphocytes Relative: 8 %
Lymphs Abs: 0.9 10*3/uL (ref 0.7–4.0)
MCH: 28.9 pg (ref 26.0–34.0)
MCHC: 32.6 g/dL (ref 30.0–36.0)
MCV: 88.7 fL (ref 80.0–100.0)
Monocytes Absolute: 0.5 10*3/uL (ref 0.1–1.0)
Monocytes Relative: 4 %
Neutro Abs: 9.8 10*3/uL — ABNORMAL HIGH (ref 1.7–7.7)
Neutrophils Relative %: 87 %
Platelets: 304 10*3/uL (ref 150–400)
RBC: 4.33 MIL/uL (ref 4.22–5.81)
RDW: 15.4 % (ref 11.5–15.5)
WBC: 11.3 10*3/uL — ABNORMAL HIGH (ref 4.0–10.5)
nRBC: 0 % (ref 0.0–0.2)

## 2019-04-30 LAB — URINALYSIS, ROUTINE W REFLEX MICROSCOPIC
Bilirubin Urine: NEGATIVE
Glucose, UA: NEGATIVE mg/dL
Hgb urine dipstick: NEGATIVE
Ketones, ur: NEGATIVE mg/dL
Leukocytes,Ua: NEGATIVE
Nitrite: NEGATIVE
Protein, ur: NEGATIVE mg/dL
Specific Gravity, Urine: 1.011 (ref 1.005–1.030)
pH: 5 (ref 5.0–8.0)

## 2019-04-30 LAB — MULTIPLE MYELOMA PANEL, SERUM
Albumin SerPl Elph-Mcnc: 3.7 g/dL (ref 2.9–4.4)
Albumin/Glob SerPl: 1.4 (ref 0.7–1.7)
Alpha 1: 0.3 g/dL (ref 0.0–0.4)
Alpha2 Glob SerPl Elph-Mcnc: 0.8 g/dL (ref 0.4–1.0)
B-Globulin SerPl Elph-Mcnc: 1.1 g/dL (ref 0.7–1.3)
Gamma Glob SerPl Elph-Mcnc: 0.6 g/dL (ref 0.4–1.8)
Globulin, Total: 2.8 g/dL (ref 2.2–3.9)
IgA: 185 mg/dL (ref 61–437)
IgG (Immunoglobin G), Serum: 757 mg/dL (ref 603–1613)
IgM (Immunoglobulin M), Srm: 119 mg/dL (ref 15–143)
Total Protein ELP: 6.5 g/dL (ref 6.0–8.5)

## 2019-04-30 LAB — LACTIC ACID, PLASMA: Lactic Acid, Venous: 0.6 mmol/L (ref 0.5–1.9)

## 2019-04-30 LAB — OSMOLALITY, URINE: Osmolality, Ur: 318 mOsm/kg (ref 300–900)

## 2019-04-30 LAB — BRAIN NATRIURETIC PEPTIDE: B Natriuretic Peptide: 73.5 pg/mL (ref 0.0–100.0)

## 2019-04-30 LAB — LIPASE, BLOOD: Lipase: 27 U/L (ref 11–51)

## 2019-04-30 LAB — SODIUM, URINE, RANDOM: Sodium, Ur: 26 mmol/L

## 2019-04-30 MED ORDER — ALLOPURINOL 100 MG PO TABS
50.0000 mg | ORAL_TABLET | Freq: Every day | ORAL | Status: DC
  Administered 2019-05-01 – 2019-05-04 (×4): 50 mg via ORAL
  Filled 2019-04-30 (×4): qty 1

## 2019-04-30 MED ORDER — IPRATROPIUM BROMIDE 0.06 % NA SOLN
2.0000 | Freq: Two times a day (BID) | NASAL | Status: DC
  Administered 2019-05-02 – 2019-05-03 (×4): 2 via NASAL
  Filled 2019-04-30: qty 30
  Filled 2019-04-30: qty 15

## 2019-04-30 MED ORDER — ASPIRIN 325 MG PO TABS
325.0000 mg | ORAL_TABLET | Freq: Every day | ORAL | Status: DC
  Administered 2019-05-01 – 2019-05-04 (×4): 325 mg via ORAL
  Filled 2019-04-30 (×4): qty 1

## 2019-04-30 MED ORDER — ONDANSETRON HCL 4 MG/2ML IJ SOLN
4.0000 mg | Freq: Four times a day (QID) | INTRAMUSCULAR | Status: DC | PRN
  Administered 2019-05-01: 4 mg via INTRAVENOUS
  Filled 2019-04-30: qty 2

## 2019-04-30 MED ORDER — PANTOPRAZOLE SODIUM 40 MG PO TBEC
40.0000 mg | DELAYED_RELEASE_TABLET | Freq: Every day | ORAL | Status: DC
  Administered 2019-05-01 – 2019-05-04 (×4): 40 mg via ORAL
  Filled 2019-04-30 (×4): qty 1

## 2019-04-30 MED ORDER — SODIUM CHLORIDE 0.9 % IV BOLUS (SEPSIS)
500.0000 mL | Freq: Once | INTRAVENOUS | Status: AC
  Administered 2019-04-30: 500 mL via INTRAVENOUS

## 2019-04-30 MED ORDER — ESCITALOPRAM OXALATE 10 MG PO TABS
10.0000 mg | ORAL_TABLET | Freq: Every day | ORAL | Status: DC
  Administered 2019-05-01 – 2019-05-04 (×4): 10 mg via ORAL
  Filled 2019-04-30 (×4): qty 1

## 2019-04-30 MED ORDER — TRAZODONE HCL 100 MG PO TABS
100.0000 mg | ORAL_TABLET | Freq: Every day | ORAL | Status: DC
  Administered 2019-05-01 – 2019-05-03 (×4): 100 mg via ORAL
  Filled 2019-04-30: qty 2
  Filled 2019-04-30 (×3): qty 1
  Filled 2019-04-30: qty 2

## 2019-04-30 MED ORDER — ACETAMINOPHEN 325 MG PO TABS
650.0000 mg | ORAL_TABLET | Freq: Four times a day (QID) | ORAL | Status: DC | PRN
  Administered 2019-04-30: 650 mg via ORAL
  Filled 2019-04-30: qty 2

## 2019-04-30 MED ORDER — ACETAMINOPHEN 650 MG RE SUPP: 650.0000 mg | Freq: Four times a day (QID) | RECTAL | Status: DC | PRN

## 2019-04-30 MED ORDER — HYDROMORPHONE HCL 1 MG/ML IJ SOLN
0.5000 mg | INTRAMUSCULAR | Status: DC | PRN
  Administered 2019-04-30 – 2019-05-04 (×7): 0.5 mg via INTRAVENOUS
  Filled 2019-04-30 (×7): qty 1

## 2019-04-30 MED ORDER — SODIUM CHLORIDE 0.9 % IV SOLN
1000.0000 mL | INTRAVENOUS | Status: DC
  Administered 2019-04-30 – 2019-05-03 (×5): 1000 mL via INTRAVENOUS

## 2019-04-30 MED ORDER — LATANOPROST 0.005 % OP SOLN
1.0000 [drp] | Freq: Every day | OPHTHALMIC | Status: DC
  Administered 2019-05-01 – 2019-05-03 (×4): 1 [drp] via OPHTHALMIC
  Filled 2019-04-30 (×2): qty 2.5

## 2019-04-30 MED ORDER — FINASTERIDE 5 MG PO TABS
5.0000 mg | ORAL_TABLET | Freq: Every day | ORAL | Status: DC
  Administered 2019-05-01 – 2019-05-03 (×4): 5 mg via ORAL
  Filled 2019-04-30 (×5): qty 1

## 2019-04-30 MED ORDER — FLUTICASONE FUROATE 27.5 MCG/SPRAY NA SUSP: 1.0000 | Freq: Every day | NASAL | Status: DC

## 2019-04-30 MED ORDER — HEPARIN SODIUM (PORCINE) 5000 UNIT/ML IJ SOLN
5000.0000 [IU] | Freq: Two times a day (BID) | INTRAMUSCULAR | Status: DC
  Administered 2019-04-30 – 2019-05-04 (×8): 5000 [IU] via SUBCUTANEOUS
  Filled 2019-04-30 (×8): qty 1

## 2019-04-30 MED ORDER — ATORVASTATIN CALCIUM 40 MG PO TABS
40.0000 mg | ORAL_TABLET | Freq: Every day | ORAL | Status: DC
  Administered 2019-05-01 – 2019-05-02 (×2): 40 mg via ORAL
  Filled 2019-04-30 (×3): qty 1

## 2019-04-30 MED ORDER — SELEXIPAG 1600 MCG PO TABS
1600.0000 ug | ORAL_TABLET | Freq: Two times a day (BID) | ORAL | Status: DC
  Administered 2019-05-02 – 2019-05-04 (×5): 1600 ug via ORAL
  Filled 2019-04-30 (×6): qty 1

## 2019-04-30 MED ORDER — ROPINIROLE HCL 1 MG PO TABS
2.0000 mg | ORAL_TABLET | Freq: Three times a day (TID) | ORAL | Status: DC
  Administered 2019-05-01 – 2019-05-04 (×11): 2 mg via ORAL
  Filled 2019-04-30 (×12): qty 2

## 2019-04-30 NOTE — ED Triage Notes (Signed)
[name] from home. Pt here with complaint of bilateral leg swelling x3 days. Pt was d/c here on 3/26. Pt complaining of nausea over the 3 days as well. Hx of CHF. Denies SOB. Pt hypotensive with EMS.

## 2019-04-30 NOTE — ED Notes (Signed)
NS 144m bolus given.  Pt requesting pain med.  Tylenol 6522mPO given

## 2019-04-30 NOTE — ED Notes (Addendum)
Bladder scan = 332m

## 2019-04-30 NOTE — Telephone Encounter (Signed)
Pt left VM stating he was recently discharged from the hospital but he cant keep food or water down. Pt said he's had a bad headache and unable to sleep since thursday. Patient had a home visit with a PA this morning and was advised to go to the emergency room for dehydration. Pt agreed with PA plan. Pt requested we send this message to [facility]. Dr. Aundra Dubin is on vacation message sent to Amy Clegg,NP since she is assigned to the hospital this week.

## 2019-04-30 NOTE — Consult Note (Signed)
[facility] Renal Consultation Note  Requesting MD:  Indication for Consultation: Acute on chronic kidney disease, maintenance of euvolemia, treatment and evaluation of fluid electrolyte abnormalities, treatment and evaluation of acid-base disorders.  HPI:   Ryan [name] is a 85 y.o. male.  With a history of COPD lymphoma status post radiation treatment type II diabetes mellitus type 2 stage III/IV chronic kidney disease, primary pulmonary hypertension thought to be out of proportion to his lung disease [ILD, NSIP versus UIP] 2D echo shows normal ejection fraction with severe left ventricular hypertrophy.  Baseline creatinine appears to be about 2 mg/dL.  Patient was admitted 04/24/2019 for evaluation of CVA demonstrated acute punctate infarcts in the right occipital territory.  Carotid duplex right ICA 40-59% stenosis left ICA 1-39% stenosis.  Patient treated with aspirin 325 mg daily and atorvastatin 40 mg daily.  Patient was felt to be volume overloaded from his chronic diastolic heart failure and was treated with oral torsemide 80 mg twice daily spironolactone 25 mg daily and metolazone 2.5 mg once a week.  Patient discharged to [facility] 04/27/2019.  Medications allopurinol 50 mg daily, aspirin 325 mg daily, atorvastatin 40 mg daily, diltiazem 360 mg to 24 hours, Lexapro 10 mg daily, Proscar 5 mg daily, metolazone 2.5 mg weekly, Prilosec 20 mg daily potassium chloride 10 mEq daily potassium chloride SA 20 mEq 1 tablet 4 times daily, Adempas 1.5 mg 3 times daily, Requip 2 mg 3 times daily, selexipag 1.6 mcg twice daily spironolactone 25 mg daily torsemide 80 mg twice daily trazodone 100 mg nightly.  Patient was brought back to the emergency room with increasing lower extremity swelling severe cramping loose stools flatulence and abdominal distention.  Blood pressure 102/51 pulse 83 temperature 98 O2 sats 94% 2 L nasal cannula  Sodium 125 potassium 5.0 chloride 89 CO2 22  BUN 122 creatinine 5.27 glucose 116 calcium 7.9 albumin 4 2 liver enzymes within normal range total bilirubin 1.3 hemoglobin 12.5 WBC 11.3  Chest x-ray 04/30/2019 no acute process and chest.    Creatinine  Date/Time Value Ref Range Status  06/26/2015 09:16 AM 1.0 0.7 - 1.3 mg/dL Final  12/23/2014 04:08 PM 1.3 0.7 - 1.3 mg/dL Final  12/25/2013 09:23 AM 1.0 0.7 - 1.3 mg/dL Final  06/20/2013 08:30 AM 1.0 0.7 - 1.3 mg/dL Final  04/17/2013 08:15 AM 0.8 0.7 - 1.3 mg/dL Final  10/10/2012 09:34 AM 1.0 0.7 - 1.3 mg/dL Final  04/07/2012 09:25 AM 1.0 0.7 - 1.3 mg/dL Final  10/18/2011 08:15 AM 0.9 0.7 - 1.3 mg/dL Final   Creat  Date/Time Value Ref Range Status  08/17/2011 10:43 AM 0.9 0.6 - 1.2 mg/dl Final   Creatinine, Ser  Date/Time Value Ref Range Status  04/30/2019 12:57 PM 5.27 (H) 0.61 - 1.24 mg/dL Final  04/27/2019 11:40 AM 1.98 (H) 0.61 - 1.24 mg/dL Final  04/25/2019 03:44 PM 2.02 (H) 0.61 - 1.24 mg/dL Final  04/25/2019 04:28 AM 1.76 (H) 0.61 - 1.24 mg/dL Final  04/25/2019 12:03 AM 1.84 (H) 0.61 - 1.24 mg/dL Final  04/24/2019 10:24 AM 2.01 (H) 0.61 - 1.24 mg/dL Final  04/17/2019 11:30 AM 1.79 (H) 0.61 - 1.24 mg/dL Final  04/09/2019 12:55 PM 2.60 (H) 0.61 - 1.24 mg/dL Final  03/15/2019 12:06 PM 1.57 (H) 0.61 - 1.24 mg/dL Final  01/31/2019 01:02 PM 1.42 (H) 0.61 - 1.24 mg/dL Final  12/19/2018 03:29 PM 1.41 (H) 0.61 - 1.24 mg/dL Final  11/15/2018 02:30 PM 1.50 (H) 0.61 - 1.24 mg/dL  Final  11/06/2018 03:10 PM 2.58 (H) 0.61 - 1.24 mg/dL Final  10/17/2018 02:20 PM 1.76 (H) 0.61 - 1.24 mg/dL Final  10/06/2018 12:25 PM 1.64 (H) 0.61 - 1.24 mg/dL Final  09/26/2018 02:44 AM 1.80 (H) 0.61 - 1.24 mg/dL Final  09/25/2018 02:49 AM 1.74 (H) 0.61 - 1.24 mg/dL Final  09/24/2018 03:27 AM 1.39 (H) 0.61 - 1.24 mg/dL Final  09/23/2018 02:19 AM 1.26 (H) 0.61 - 1.24 mg/dL Final  09/22/2018 02:30 AM 1.46 (H) 0.61 - 1.24 mg/dL Final  09/21/2018 02:15 AM 1.40 (H) 0.61 - 1.24 mg/dL Final  09/20/2018  01:27 PM 1.65 (H) 0.61 - 1.24 mg/dL Final  09/12/2018 03:32 AM 1.47 (H) 0.61 - 1.24 mg/dL Final  09/11/2018 05:12 AM 1.55 (H) 0.61 - 1.24 mg/dL Final  09/10/2018 03:56 AM 1.40 (H) 0.61 - 1.24 mg/dL Final  09/09/2018 03:52 AM 1.69 (H) 0.61 - 1.24 mg/dL Final  09/08/2018 08:31 AM 1.96 (H) 0.61 - 1.24 mg/dL Final  09/07/2018 12:41 PM 2.26 (H) 0.61 - 1.24 mg/dL Final  08/25/2018 12:57 PM 1.86 (H) 0.61 - 1.24 mg/dL Final  08/18/2018 10:38 AM 1.67 (H) 0.61 - 1.24 mg/dL Final  08/11/2018 03:34 PM 1.43 (H) 0.61 - 1.24 mg/dL Final  07/26/2018 05:30 AM 1.89 (H) 0.61 - 1.24 mg/dL Final  07/25/2018 02:20 PM 1.95 (H) 0.61 - 1.24 mg/dL Final  07/25/2018 08:43 AM 1.49 (H) 0.61 - 1.24 mg/dL Final  07/24/2018 05:36 AM 1.49 (H) 0.61 - 1.24 mg/dL Final  07/23/2018 08:45 AM 1.44 (H) 0.61 - 1.24 mg/dL Final  07/22/2018 04:36 AM 1.57 (H) 0.61 - 1.24 mg/dL Final  07/21/2018 06:07 AM 1.54 (H) 0.61 - 1.24 mg/dL Final  07/20/2018 04:08 AM 1.41 (H) 0.61 - 1.24 mg/dL Final  07/19/2018 02:52 PM 1.51 (H) 0.61 - 1.24 mg/dL Final  03/08/2018 06:59 PM 1.99 (H) 0.61 - 1.24 mg/dL Final  03/16/2017 11:37 AM 1.10 0.76 - 1.27 mg/dL Final  02/22/2017 09:56 AM 1.09 0.76 - 1.27 mg/dL Final  02/07/2017 12:00 AM 1.53 (H) 0.76 - 1.27 mg/dL Final  08/15/2015 12:30 PM 1.01 0.61 - 1.24 mg/dL Final  01/06/2014 09:57 AM 0.94 0.50 - 1.35 mg/dL Final  01/04/2014 11:10 PM 0.93 0.50 - 1.35 mg/dL Final  12/19/2013 11:05 AM 0.92 0.50 - 1.35 mg/dL Final  08/21/2012 11:00 AM 0.95 0.50 - 1.35 mg/dL Final  05/21/2011 10:35 AM 1.03 0.50 - 1.35 mg/dL Final  04/20/2011 10:08 AM 1.07 0.50 - 1.35 mg/dL Final  02/17/2011 11:21 AM 1.07 0.50 - 1.35 mg/dL Final     PMHx:   Past Medical History:  Diagnosis Date  . Asthma   . Barrett's esophagus   . Benign prostatic hypertrophy   . Bilateral lower extremity edema   . CHF (congestive heart failure) ([place])   . Chronic fatigue   . COPD (chronic obstructive pulmonary disease) ([place])   . Diabetes  mellitus type 2 in nonobese ([place]) 04/24/2019  . Diverticulosis of colon (without mention of hemorrhage)   . Esophageal reflux   . Gastritis   . Gluten intolerance   . Hiatal hernia 02/10/11   Noted on CT Scan - Moderate Hiatal Hernia  . Hyperlipemia   . Hypertension   . Lymphoma of lymph nodes in pelvis ([place]) 03/03/2011    Large Right Retroperitoneal Mass  . Melanoma ([place])    lymphoma  . Microcytic anemia 04/20/2011   . 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 Osteoarthritis  hands/feet,knees, NECK, BACK  . Periaortic lymphadenopathy 02/16/2011  . Personal history of colonic polyps 2004   hyperplastic Dr. Collene Mares  . Pulmonary hyperinflation   . Renal cyst 02/10/11    Noted on CT Scan - Bilateral Renal Cysts  . S/P radiation therapy 03/15/11 - 04/09/11   Abdominal/ Pelvic Tumor, 3600 cGy/20 Fractions    Past Surgical History:  Procedure Laterality Date  . ARTHROSCOPIC REPAIR ACL     right  . BONE MARROW ASPIRATION  02/25/11   Bone Marrow, Aspirate, Clot, and Bilateral Bx, Right PIC  . CATARACT EXTRACTION, BILATERAL    . EYE SURGERY    . HERNIA REPAIR     LEFT INGUINAL   . INSERTION OF MESH N/A 08/23/2012   Procedure: INSERTION OF MESH;  Surgeon: Madilyn Hook, DO;  Location: [facility];  Service: General;  Laterality: N/A;  . LOOP RECORDER INSERTION N/A 04/26/2019   Procedure: LOOP RECORDER INSERTION;  Surgeon: Evans Lance, MD;  Location: [place] [facility];  Service: Cardiovascular;  Laterality: N/A;  . LUMBAR LAMINECTOMY/DECOMPRESSION MICRODISCECTOMY Right 12/31/2013   Procedure: RIGHT L4-5 L5-S1 LAMINECTOMY;  Surgeon: Kristeen Miss, MD;  Location: [place];  Service: Neurosurgery;  Laterality: Right;  RIGHT L4-5 L5-S1 LAMINECTOMY  . RIGHT HEART CATH N/A 07/25/2018   Procedure: RIGHT HEART CATH;  Surgeon: Larey Dresser, MD;  Location: [place] [facility];  Service: Cardiovascular;  Laterality: N/A;  . ROTATOR CUFF REPAIR      right  . TONSILLECTOMY    . TONSILLECTOMY    . VENTRAL HERNIA REPAIR N/A 08/23/2012   Procedure: HERNIA REPAIR VENTRAL ADULT;  Surgeon: Madilyn Hook, DO;  Location: [facility];  Service: General;  Laterality: N/A;    Family Hx:  Family History  Problem Relation Age of Onset  . Heart disease Father        MI 73  . Urticaria Father   . Prostate cancer Brother   . Prostate cancer Paternal Uncle   . Prostate cancer Paternal Uncle   . Prostate cancer Paternal Uncle   . Hyperlipidemia Other   . Stroke Other   . Hypertension Other   . ADD / ADHD Other   . Colon cancer Neg Hx   . Allergic rhinitis Neg Hx   . Asthma Neg Hx   . Eczema Neg Hx     Social History:  reports that he quit smoking about 27 years ago. His smoking use included pipe and cigarettes. He has a 35.00 pack-year smoking history. He has never used smokeless tobacco. He reports current alcohol use. He reports that he does not use drugs.  Allergies:  Allergies  Allergen Reactions  . Pollen Extract-Tree Extract Other (See Comments)    HEADACHES, TIRED , DRAINAGE FROM SINUSES  . Molds & Smuts Other (See Comments)    Also dust mites causes sinus infections, h/a etc.    Medications: Prior to Admission medications   Medication Sig Start Date End Date Taking? Authorizing Provider  allopurinol (ZYLOPRIM) 100 MG tablet Take 50 mg by mouth daily. 12/13/17   [provider]  aspirin 325 MG tablet Take 1 tablet (325 mg total) by mouth daily. 04/28/19   Hosie Poisson, MD  atorvastatin (LIPITOR) 40 MG tablet Take 1 tablet (40 mg total) by mouth daily at 6 PM. 04/27/19   Hosie Poisson, MD  diltiazem (CARDIZEM CD) 360 MG 24 hr capsule Take 1 capsule (360 mg total) by mouth daily. 10/04/18   Larey Dresser, MD  escitalopram (LEXAPRO) 10 MG tablet Take 10 mg by mouth daily.    [provider]  finasteride (PROSCAR) 5 MG tablet Take 1 tablet (5 mg total) by mouth daily. Patient taking differently: Take 5 mg by mouth at  bedtime.  12/25/12   Norins, Heinz Knuckles, MD  FLONASE SENSIMIST 27.5 MCG/SPRAY nasal spray Place 1 spray into the nose daily as needed for rhinitis or allergies.  12/11/18   [provider]  ipratropium (ATROVENT) 0.03 % nasal spray Place 2 sprays into both nostrils 2 (two) times daily. 02/05/19   [provider]  latanoprost (XALATAN) 0.005 % ophthalmic solution Place 1 drop into both eyes at bedtime.  01/20/18   [provider]  metolazone (ZAROXOLYN) 2.5 MG tablet Take 1 tablet (2.5 mg total) by mouth once a week. Every Wednesday Patient taking differently: Take 2.5 mg by mouth once a week. Every Thursday, but was instructed to take a dose on Tuesday this week 04/10/19   Larey Dresser, MD  omeprazole (PRILOSEC) 20 MG capsule Take 20 mg by mouth daily.    [provider]  potassium chloride (KLOR-CON) 10 MEQ tablet Take 10 mEq by mouth daily.    [provider]  potassium chloride SA (KLOR-CON) 20 MEQ tablet Take 1 tablet by mouth in the morning, at noon, in the evening, and at bedtime. 04/22/18   [provider]  Riociguat (ADEMPAS) 1.5 MG TABS Take 1.5 mg by mouth 3 (three) times daily.    [provider]  rOPINIRole (REQUIP) 2 MG tablet Take 1 tablet (2 mg total) by mouth in the morning, at noon, and at bedtime. 03/20/19   Kathrynn Ducking, MD  Selexipag 1600 MCG TABS Take 1,600 mcg by mouth 2 (two) times daily.    [provider]  spironolactone (ALDACTONE) 25 MG tablet Take 1 tablet (25 mg total) by mouth daily. 09/27/18 03/20/19  Amin, Jeanella Flattery, MD  torsemide (DEMADEX) 20 MG tablet Take 4 tablets (80 mg total) by mouth 2 (two) times daily. 04/11/19 05/11/19  Larey Dresser, MD  traMADol (ULTRAM) 50 MG tablet Take 50 mg by mouth every 6 (six) hours as needed for moderate pain.     [provider]  traZODone (DESYREL) 100 MG tablet Take 100 mg by mouth at bedtime. 02/12/19   [provider]     Labs:   Results for orders placed or performed during the hospital encounter of 04/30/19 (from the past 48 hour(s))  CBC with Differential/Platelet     Status: Abnormal   Collection Time: 04/30/19 12:57 PM  Result Value Ref Range   WBC 11.3 (H) 4.0 - 10.5 K/uL   RBC 4.33 4.22 - 5.81 MIL/uL   Hemoglobin 12.5 (L) 13.0 - 17.0 g/dL   HCT 38.4 (L) 39.0 - 52.0 %   MCV 88.7 80.0 - 100.0 fL   MCH 28.9 26.0 - 34.0 pg   MCHC 32.6 30.0 - 36.0 g/dL   RDW 15.4 11.5 - 15.5 %   Platelets 304 150 - 400 K/uL   nRBC 0.0 0.0 - 0.2 %   Neutrophils Relative % 87 %   Neutro Abs 9.8 (H) 1.7 - 7.7 K/uL   Lymphocytes Relative 8 %   Lymphs Abs 0.9 0.7 - 4.0 K/uL   Monocytes Relative 4 %   Monocytes Absolute 0.5 0.1 - 1.0 K/uL   Eosinophils Relative 0 %   Eosinophils Absolute 0.0 0.0 - 0.5 K/uL   Basophils Relative 0 %  Basophils Absolute 0.0 0.0 - 0.1 K/uL   Immature Granulocytes 1 %   Abs Immature Granulocytes 0.06 0.00 - 0.07 K/uL    Comment: Performed at [facility], [place] [address], [place], [place] [address]  Brain natriuretic peptide     Status: None   Collection Time: 04/30/19 12:57 PM  Result Value Ref Range   B Natriuretic Peptide 73.5 0.0 - 100.0 pg/mL    Comment: Performed at [facility] [address], [place], [place] [address]  Comprehensive metabolic panel     Status: Abnormal   Collection Time: 04/30/19 12:57 PM  Result Value Ref Range   Sodium 125 (L) 135 - 145 mmol/L   Potassium 5.0 3.5 - 5.1 mmol/L   Chloride 89 (L) 98 - 111 mmol/L   CO2 22 22 - 32 mmol/L   Glucose, Bld 116 (H) 70 - 99 mg/dL    Comment: Glucose reference range applies only to samples taken after fasting for at least 8 hours.   BUN 122 (H) 8 - 23 mg/dL   Creatinine, Ser 5.27 (H) 0.61 - 1.24 mg/dL   Calcium 7.9 (L) 8.9 - 10.3 mg/dL   Total Protein 7.5 6.5 - 8.1 g/dL   Albumin 4.2 3.5 - 5.0 g/dL   AST 16 15 - 41 U/L   ALT 21 0 - 44 U/L   Alkaline Phosphatase 99 38 - 126 U/L   Total Bilirubin 1.3 (H)  0.3 - 1.2 mg/dL   GFR calc non Af Amer 9 (L) >60 mL/min   GFR calc Af Amer 11 (L) >60 mL/min   Anion gap 14 5 - 15    Comment: Performed at [facility] [address] [place], [place] [address]  Lipase, blood     Status: None   Collection Time: 04/30/19 12:57 PM  Result Value Ref Range   Lipase 27 11 - 51 U/L    Comment: Performed at [facility], [place] [address] [place], [place] [address]  Lactic acid, plasma     Status: None   Collection Time: 04/30/19  2:24 PM  Result Value Ref Range   Lactic Acid, Venous 0.6 0.5 - 1.9 mmol/L    Comment: Performed at [facility] [address] [place], [place] [address]     ROS:  General weak and fatigued rigors no fevers Eyes no visual loss Ears nose throat no hearing loss epistaxis sore throat Cardiovascular denies anginal chest pain orthopnea PND syncope admits to increasing lower extremity swelling Respiratory no cough no wheeze no hemoptysis chronic oxygen Abdominal system distended diarrhea stool no abdominal pain no vomiting poor appetite Urogenital no urgency frequency dysuria nocturia hematuria Musculoskeletal complains of diffuse severe cramps of the back and thighs Dermatologic no skin rash no itching Endocrine no diabetes or thyroid disease Neurologic history of carotid disease, recent admission for possible CVA with multiple punctate infarcts noted on MRI. Infectious disease no history of chronic infections  Physical Exam: Vitals:   04/30/19 1330 04/30/19 1409  BP: 90/60 (!) 88/56  Pulse: 89 85  Resp: 12 18  Temp:    SpO2: 91% 94%     General: Alert frail weak elderly using oxygen HEENT: Normocephalic atraumatic oropharynx was clear  Eyes: Extraocular movements intact Neck:neck supple no thyromegaly no JVP Heart: Regular rate and rhythm Lungs: Clear to auscultation Abdomen: Distended tympanic diminished bowel sounds Extremities: Diffuse 2+ pitting edema lower extremities Skin: Tense thin  fragile Neuro: Grossly  intact  Assessment/Plan: 1.Acute on chronic kidney injury Baseline stage IV chronic kidney disease has had progression of renal disease.  Does not appear that there has been any nephrotoxic insult since discharge.  No use of nonsteroidal anti-inflammatory drugs ACE inhibitors or ARB use: No use of IV contrast.  Appears to have hemodynamic instability with some hypotension.  This would point to a diagnosis of probable acute tubular necrosis.  Will check urine sediment and renal ultrasound.  Not a candidate for dialysis.  Patient has been on hospice last year.  Would recommend palliative care consultation.  Abdominal distention could point to a diagnosis of abdominal compartment syndrome.  Would recommend transducing bladder pressures if renal function does not improve with improved hydration. 2. Hypertension/volume  -increased lower extremity swelling.  Will check daily weights.  Low blood pressures at this present time.  Would not continue diuretics.  Would allow blood pressure to gently rise. 3.  Hyponatremia.  Patient was using thiazide type diuretic metolazone.  Should improve with discontinuation of metolazone. 4.  Severe pulmonary hypertension.  As per primary service 5.  COPD oxygen dependent does not appear to be an issue at this time. 6.  History of non-Hodgkin's lymphoma. 7.  Disposition patient is clearly not a candidate for dialysis with systemic hypotension and severe comorbid disorders.  Has already been enrolled in hospice last year.  Would suggest reconsulting palliative medicine.   Sherril Croon 04/30/2019, 3:52 PM

## 2019-04-30 NOTE — ED Provider Notes (Signed)
[facility] EMERGENCY DEPARTMENT Provider Note   CSN: [id number] Arrival date & time: 04/30/19  1239     History Chief Complaint  Patient presents with  . Leg Swelling    Ryan [name] is a 86 y.o. male.  HPI   Patient presents to the ED for evaluation of persistent leg swelling.  Patient states he was recently in the hospital.  He was admitted on March 26 and at that time it was for an evaluation of a stroke.  Patient also did have significant leg swelling at that time.  He was discharged on Friday.  Patient states over the weekend his legs have become more swollen.  He also has had nausea and has not been able to eat or drink properly.  He denies any diarrhea but has had a lot of flatulence.  He denies any abdominal pain.  No fevers.  No vomiting.  Has also had some leg cramping.  He has not been able to sleep properly but that is more related to his chronic back pain.  Patient also states that his urine output has decreased.  Patient states he has not urinated in the last day or 2 but when asked to clarify states it only been a very small amount  Past Medical History:  Diagnosis Date  . Asthma   . Barrett's esophagus   . Benign prostatic hypertrophy   . Bilateral lower extremity edema   . CHF (congestive heart failure) ([place])   . Chronic fatigue   . COPD (chronic obstructive pulmonary disease) ([place])   . Diabetes mellitus type 2 in nonobese ([place]) 04/24/2019  . Diverticulosis of colon (without mention of hemorrhage)   . Esophageal reflux   . Gastritis   . Gluten intolerance   . Hiatal hernia 02/10/11   Noted on CT Scan - Moderate Hiatal Hernia  . Hyperlipemia   . Hypertension   . 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 Melanoma ([place])    lymphoma  . Microcytic anemia 04/20/2011   . 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 Osteoarthritis    hands/feet,knees, NECK, BACK  . Periaortic lymphadenopathy 02/16/2011  . Personal history of colonic polyps 2004   hyperplastic Dr. Collene Mares  . Pulmonary hyperinflation   . Renal cyst 02/10/11    Noted on CT Scan - Bilateral Renal Cysts  . S/P radiation therapy 03/15/11 - 04/09/11   Abdominal/ Pelvic Tumor, 3600 cGy/20 Fractions    Patient Active Problem List   Diagnosis Date Noted  . Acute CVA (cerebrovascular accident) ([place]) 04/24/2019  . Diabetes mellitus type 2 in nonobese ([place]) 04/24/2019  . Acute respiratory failure with hypoxia and hypercarbia ([place]) 09/20/2018  . CKD (chronic kidney disease) stage 3, GFR 30-59 ml/min 09/20/2018  . Lethargy 09/20/2018  . Acute metabolic encephalopathy 93/23/5573  . CHF (congestive heart failure) ([place]) 09/20/2018  . Acute kidney injury superimposed on CKD ([place]) 09/10/2018  . Pulmonary hypertension, unspecified ([place]) 09/10/2018  . Multifocal atrial tachycardia ([place]) 09/10/2018  . CHF exacerbation ([place]) 09/07/2018  . Leg pain 09/07/2018  . Hyperkalemia 07/26/2018  . Acute on chronic diastolic CHF (congestive heart failure) ([place])   . Bilateral lower extremity edema 02/02/2017  . Pulmonary fibrosis ([place]) 11/22/2016  . Blurry vision, bilateral 10/26/2016  . COPD ([place]) 07/05/2016  . Exertional dyspnea 09/10/2015  . Fatigue/malaise 08/19/2015  . Asthma/COPD 02/12/2015  . 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 Hypoxemia 01/05/2014  . Essential hypertension 01/05/2014  . Hypokalemia 01/05/2014  . Urinary retention 01/05/2014  . Hypoxia   . Lumbar radiculopathy 12/31/2013  . RLS (restless legs syndrome) 09/21/2012  . Chronic lower back pain 06/15/2012  . Abdominal pain, unspecified site 05/19/2012  . Unspecified deficiency anemia 04/07/2012  . Melanoma ([place])   . Non Hodgkin's lymphoma ([place])   . Periaortic lymphadenopathy 02/16/2011  . Barrett's esophagus 07/09/2010  . Personal history of colonic polyps 05/28/2010  . History of IBS  05/28/2010  . Diverticulosis of colon (without mention of hemorrhage) 05/28/2010  . Arthritis involving multiple sites 05/28/2010  . Wheat intolerance 05/28/2010  . Chronic venous insufficiency 08/19/2009  . PEDAL EDEMA 08/19/2009  . INSOMNIA, CHRONIC 12/24/2008  . EUSTACHIAN TUBE DYSFUNCTION, BILATERAL 12/23/2008  . ERECTILE DYSFUNCTION, ORGANIC 08/01/2008  . Allergic rhinitis with a nonallergic component 05/20/2008  . VENTRAL HERNIA 02/12/2008  . PALPITATIONS 12/25/2007  . LACTOSE INTOLERANCE 10/17/2007  . Hyperlipidemia 09/14/2006  . BPH (benign prostatic hyperplasia) 09/14/2006  . OSTEOARTHRITIS 09/14/2006  . HEART MURMUR, HX OF 09/14/2006    Past Surgical History:  Procedure Laterality Date  . ARTHROSCOPIC REPAIR ACL     right  . BONE MARROW ASPIRATION  02/25/11   Bone Marrow, Aspirate, Clot, and Bilateral Bx, Right PIC  . CATARACT EXTRACTION, BILATERAL    . EYE SURGERY    . HERNIA REPAIR     LEFT INGUINAL   . INSERTION OF MESH N/A 08/23/2012   Procedure: INSERTION OF MESH;  Surgeon: Madilyn Hook, DO;  Location: [facility];  Service: General;  Laterality: N/A;  . LOOP RECORDER INSERTION N/A 04/26/2019   Procedure: LOOP RECORDER INSERTION;  Surgeon: Evans Lance, MD;  Location: [facility];  Service: Cardiovascular;  Laterality: N/A;  . LUMBAR LAMINECTOMY/DECOMPRESSION MICRODISCECTOMY Right 12/31/2013   Procedure: RIGHT L4-5 L5-S1 LAMINECTOMY;  Surgeon: Kristeen Miss, MD;  Location: [facility];  Service: Neurosurgery;  Laterality: Right;  RIGHT L4-5 L5-S1 LAMINECTOMY  . RIGHT HEART CATH N/A 07/25/2018   Procedure: RIGHT HEART CATH;  Surgeon: Larey Dresser, MD;  Location: [facility];  Service: Cardiovascular;  Laterality: N/A;  . ROTATOR CUFF REPAIR     right  . TONSILLECTOMY    . TONSILLECTOMY    . VENTRAL HERNIA REPAIR N/A 08/23/2012   Procedure: HERNIA REPAIR VENTRAL ADULT;  Surgeon: Madilyn Hook, DO;  Location: [facility];  Service: General;  Laterality: N/A;        Family History  Problem Relation Age of Onset  . Heart disease Father        MI 58  . Urticaria Father   . Prostate cancer Brother   . Prostate cancer Paternal Uncle   . Prostate cancer Paternal Uncle   . Prostate cancer Paternal Uncle   . Hyperlipidemia Other   . Stroke Other   . Hypertension Other   . ADD / ADHD Other   . Colon cancer Neg Hx   . Allergic rhinitis Neg Hx   . Asthma Neg Hx   . Eczema Neg Hx     Social History   Tobacco Use  . Smoking status: Former Smoker    Packs/day: 1.00    Years: 35.00    Pack years: 35.00    Types: Pipe, Cigarettes    Quit date: 02/23/1992    Years since quitting: 27.2  . Smokeless tobacco: Never Used  . Tobacco comment: quit 20 years ago  Substance Use Topics  . Alcohol use: Yes    Comment: 2 drinks daily scotch  AND WINE WITH SUPPER  . Drug use: No    Home Medications Prior to Admission medications   Medication Sig Start Date End Date Taking? Authorizing Provider  allopurinol (ZYLOPRIM) 100 MG tablet Take 50 mg by mouth daily. 12/13/17   [provider]  aspirin 325 MG tablet Take 1 tablet (325 mg total) by mouth daily. 04/28/19   Hosie Poisson, MD  atorvastatin (LIPITOR) 40 MG tablet Take 1 tablet (40 mg total) by mouth daily at 6 PM. 04/27/19   Hosie Poisson, MD  diltiazem (CARDIZEM CD) 360 MG 24 hr capsule Take 1 capsule (360 mg total) by mouth daily. 10/04/18   Larey Dresser, MD  escitalopram (LEXAPRO) 10 MG tablet Take 10 mg by mouth daily.    [provider]  finasteride (PROSCAR) 5 MG tablet Take 1 tablet (5 mg total) by mouth daily. Patient taking differently: Take 5 mg by mouth at bedtime.  12/25/12   Norins, Heinz Knuckles, MD  FLONASE SENSIMIST 27.5 MCG/SPRAY nasal spray Place 1 spray into the nose daily as needed for rhinitis or allergies.  12/11/18   [provider]  ipratropium (ATROVENT) 0.03 % nasal spray Place 2 sprays into both nostrils 2 (two) times daily. 02/05/19   [provider]  latanoprost (XALATAN) 0.005 % ophthalmic solution Place 1 drop into both eyes at bedtime.  01/20/18   [provider]  metolazone (ZAROXOLYN) 2.5 MG tablet Take 1 tablet (2.5 mg total) by mouth once a week. Every Wednesday Patient taking differently: Take 2.5 mg by mouth once a week. Every Thursday, but was instructed to take a dose on Tuesday this week 04/10/19   Larey Dresser, MD  omeprazole (PRILOSEC) 20 MG capsule Take 20 mg by mouth daily.    [provider]  potassium chloride (KLOR-CON) 10 MEQ tablet Take 10 mEq by mouth daily.    [provider]  potassium chloride SA (KLOR-CON) 20 MEQ tablet Take 1 tablet by mouth in the morning, at noon, in the evening, and at bedtime. 04/22/18   [provider]  Riociguat (ADEMPAS) 1.5 MG TABS Take 1.5 mg by mouth 3 (three) times daily.    [provider]  rOPINIRole (REQUIP) 2 MG tablet Take 1 tablet (2 mg total) by mouth in the morning, at noon, and at bedtime. 03/20/19   Kathrynn Ducking, MD  Selexipag 1600 MCG TABS Take 1,600 mcg by mouth 2 (two) times daily.    [provider]  spironolactone (ALDACTONE) 25 MG tablet Take 1 tablet (25 mg total) by mouth daily. 09/27/18 03/20/19  Amin, Jeanella Flattery, MD  torsemide (DEMADEX) 20 MG tablet Take 4 tablets (80 mg total) by mouth 2 (two) times daily. 04/11/19 05/11/19  Larey Dresser, MD  traMADol (ULTRAM) 50 MG tablet Take 50 mg by mouth every 6 (six) hours as needed for moderate pain.     [provider]  traZODone (DESYREL) 100 MG tablet Take 100 mg by mouth at bedtime. 02/12/19   [provider]    Allergies    Pollen extract-tree extract and Molds & smuts  Review of Systems   Review of Systems  All other systems reviewed and are negative.   Physical Exam Updated Vital Signs BP (!) 88/56 (BP Location: Right Arm)   Pulse 85   Temp 98 F (36.7 C) (Oral)   Resp 18  Ht 1.702 m (_0 )   Wt 84.8 kg   SpO2 94%    BMI 29.29 kg/m   Physical Exam Vitals and nursing note reviewed.  Constitutional:      Appearance: He is well-developed.     Comments: Chronically ill-appearing  HENT:     Head: Normocephalic and atraumatic.     Right Ear: External ear normal.     Left Ear: External ear normal.  Eyes:     General: No scleral icterus.       Right eye: No discharge.        Left eye: No discharge.     Conjunctiva/sclera: Conjunctivae normal.  Neck:     Trachea: No tracheal deviation.  Cardiovascular:     Rate and Rhythm: Normal rate and regular rhythm.  Pulmonary:     Effort: Pulmonary effort is normal. No respiratory distress.     Breath sounds: Normal breath sounds. No stridor. No wheezing or rales.  Abdominal:     General: Bowel sounds are normal. There is no distension.     Palpations: Abdomen is soft.     Tenderness: There is no abdominal tenderness. There is no guarding or rebound.  Musculoskeletal:        General: No tenderness.     Cervical back: Neck supple.     Right lower leg: Edema present.     Left lower leg: Edema present.     Comments: Pitting edema bilateral lower extremities  Skin:    General: Skin is warm and dry.     Findings: No rash.  Neurological:     Mental Status: He is alert.     Cranial Nerves: No cranial nerve deficit (no facial droop, extraocular movements intact, no slurred speech).     Sensory: No sensory deficit.     Motor: No abnormal muscle tone or seizure activity.     Coordination: Coordination normal.     Comments: 5 out of 5 strength bilateral upper extremity and lower extremities, normal sensation throughout, mild aphasia     ED Results / Procedures / Treatments   Labs (all labs ordered are listed, but only abnormal results are displayed) Labs Reviewed  CBC WITH DIFFERENTIAL/PLATELET - Abnormal; Notable for the following components:      Result Value   WBC 11.3 (*)    Hemoglobin 12.5 (*)    HCT 38.4 (*)    Neutro Abs 9.8 (*)    All other  components within normal limits  COMPREHENSIVE METABOLIC PANEL - Abnormal; Notable for the following components:   Sodium 125 (*)    Chloride 89 (*)    Glucose, Bld 116 (*)    BUN 122 (*)    Creatinine, Ser 5.27 (*)    Calcium 7.9 (*)    Total Bilirubin 1.3 (*)    GFR calc non Af Amer 9 (*)    GFR calc Af Amer 11 (*)    All other components within normal limits  CULTURE, BLOOD (ROUTINE X 2)  CULTURE, BLOOD (ROUTINE X 2)  BRAIN NATRIURETIC PEPTIDE  LIPASE, BLOOD  URINALYSIS, ROUTINE W REFLEX MICROSCOPIC  LACTIC ACID, PLASMA    EKG EKG Interpretation  Date/Time:  Monday April 30 2019 12:49:34 EDT Ventricular Rate:  89 PR Interval:    QRS Duration: 125 QT Interval:  373 QTC Calculation: 454 R Axis:   82 Text Interpretation: Sinus rhythm Multiple ventricular premature complexes , new since last tracing Right bundle branch block Confirmed by Dorie Rank [phone])  on 04/30/2019 12:50:52 PM   Radiology DG Chest Portable 1 View  Result Date: 04/30/2019 CLINICAL DATA:  Leg swelling EXAM: PORTABLE CHEST 1 VIEW COMPARISON:  04/24/2019 FINDINGS: Low lung volumes. No new consolidation or edema. No pleural effusion or pneumothorax. Large hiatal hernia. IMPRESSION: No acute process in the chest. Electronically Signed   By: Macy Mis M.D.   On: 04/30/2019 14:06    Procedures .Critical Care Performed by: Dorie Rank, MD Authorized by: Dorie Rank, MD   Critical care provider statement:    Critical care time (minutes):  45   Critical care was time spent personally by me on the following activities:  Discussions with consultants, evaluation of patient's response to treatment, examination of patient, ordering and performing treatments and interventions, ordering and review of laboratory studies, ordering and review of radiographic studies, pulse oximetry, re-evaluation of patient's condition, obtaining history from patient or surrogate and review of old charts   (including critical care  time)  Medications Ordered in ED Medications  sodium chloride 0.9 % bolus 500 mL (500 mLs Intravenous New Bag/Given 04/30/19 1429)    Followed by  0.9 %  sodium chloride infusion (1,000 mLs Intravenous New Bag/Given 04/30/19 1430)    ED Course  I have reviewed the triage vital signs and the nursing notes.  Pertinent labs & imaging results that were available during my care of the patient were reviewed by me and considered in my medical decision making (see chart for details).  Clinical Course as of Apr 29 1444  Mon Apr 30, 2019  1410 Notified about BP.  No obvious signs to suggest infection.  Will add on lactic acid   [JK]  1423 Labs notable for acute renal failure.  Significant changes from recent admission.  Will hydrate.   [JK]    Clinical Course User Index [JK] Dorie Rank, MD   MDM Rules/Calculators/A&P                      Patient presented with increasing weakness.  Patient is ED work-up is notable for hypotension and acute kidney injury.  His creatinine is significantly elevated compared to 3 days ago.  I suspect this is related to his diuresis.  Patient does have a slight leukocytosis but his symptoms are not suggestive of infection.  I will order a bladder scan to make sure there is no evidence of bladder outlet obstruction.  I have ordered IV fluids.  I will consult the medical service for treatment and further evaluation. Final Clinical Impression(s) / ED Diagnoses Final diagnoses:  AKI (acute kidney injury) ([place])  Peripheral edema      Dorie Rank, MD 04/30/19 [phone]

## 2019-04-30 NOTE — H&P (Signed)
History and Physical    ZARIUS FURR ACZ:[id number] DOB: Sep 30, 1931 DOA: 04/30/2019  PCP: Roetta Sessions, NP   Patient coming from: Home   Chief Complaint: Decreasing urine, feeling nauseous  HPI: Ryan [name] is a 86 y.o. male with medical history significant of interstitial lung disease on home oxygen, diastolic CHF prior non-Hodgkin's lymphoma pelvic mass status post radiation therapy diabetes mellitus type 2 chronic kidney disease stage IV anemia. He hospitalized recently for stroke, and was discharged on March 26.  Patient started to notice increasing bilateral leg swelling since Friday.  He also has had nausea and has not been able to eat or drink properly.  He denies any diarrhea but has had a lot of flatulence.  He denies any abdominal pain.  No fevers.  No vomiting.    His urine output has significant decreased over the last 2 days, with increasing leg swelling, he denied any dysuria.  Patient also reported chronic back pain, aching like, and he reported a lot of muscle cramp around the lower back.  He gained about 2 pounds over the weekend.  Patient reported he continued to take all his blood pressure meds and diuresis medications without interruption despite feeling sick. ED Course: Creatinine 5.2 compared to 1.93 days ago.  Pressure borderline  Review of Systems: As per HPI otherwise 10 point review of systems negative.    Past Medical History:  Diagnosis Date  . Asthma   . Barrett's esophagus   . Benign prostatic hypertrophy   . Bilateral lower extremity edema   . CHF (congestive heart failure) ([place])   . Chronic fatigue   . COPD (chronic obstructive pulmonary disease) ([place])   . Diabetes mellitus type 2 in nonobese ([place]) 04/24/2019  . Diverticulosis of colon (without mention of hemorrhage)   . Esophageal reflux   . Gastritis   . Gluten intolerance   . Hiatal hernia 02/10/11   Noted on CT Scan - Moderate Hiatal Hernia  . Hyperlipemia   . Hypertension   .  Lymphoma of lymph nodes in pelvis ([place]) 03/03/2011    Large Right Retroperitoneal Mass  . Melanoma ([place])    lymphoma  . Microcytic anemia 04/20/2011   . 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 Osteoarthritis    hands/feet,knees, NECK, BACK  . Periaortic lymphadenopathy 02/16/2011  . Personal history of colonic polyps 2004   hyperplastic Dr. Collene Mares  . Pulmonary hyperinflation   . Renal cyst 02/10/11    Noted on CT Scan - Bilateral Renal Cysts  . S/P radiation therapy 03/15/11 - 04/09/11   Abdominal/ Pelvic Tumor, 3600 cGy/20 Fractions    Past Surgical History:  Procedure Laterality Date  . ARTHROSCOPIC REPAIR ACL     right  . BONE MARROW ASPIRATION  02/25/11   Bone Marrow, Aspirate, Clot, and Bilateral Bx, Right PIC  . CATARACT EXTRACTION, BILATERAL    . EYE SURGERY    . HERNIA REPAIR     LEFT INGUINAL   . INSERTION OF MESH N/A 08/23/2012   Procedure: INSERTION OF MESH;  Surgeon: Madilyn Hook, DO;  Location: [facility];  Service: General;  Laterality: N/A;  . LOOP RECORDER INSERTION N/A 04/26/2019   Procedure: LOOP RECORDER INSERTION;  Surgeon: Evans Lance, MD;  Location: [place] [facility];  Service: Cardiovascular;  Laterality: N/A;  . LUMBAR LAMINECTOMY/DECOMPRESSION MICRODISCECTOMY Right 12/31/2013   Procedure: RIGHT L4-5 L5-S1 LAMINECTOMY;  Surgeon: Kristeen Miss, MD;  Location:  [facility];  Service: Neurosurgery;  Laterality: Right;  RIGHT L4-5 L5-S1 LAMINECTOMY  . RIGHT HEART CATH N/A 07/25/2018   Procedure: RIGHT HEART CATH;  Surgeon: Larey Dresser, MD;  Location: [place] [facility];  Service: Cardiovascular;  Laterality: N/A;  . ROTATOR CUFF REPAIR     right  . TONSILLECTOMY    . TONSILLECTOMY    . VENTRAL HERNIA REPAIR N/A 08/23/2012   Procedure: HERNIA REPAIR VENTRAL ADULT;  Surgeon: Madilyn Hook, DO;  Location: [facility];  Service: General;  Laterality: N/A;     reports that he quit smoking about 27 years ago. His  smoking use included pipe and cigarettes. He has a 35.00 pack-year smoking history. He has never used smokeless tobacco. He reports current alcohol use. He reports that he does not use drugs.  Allergies  Allergen Reactions  . Pollen Extract-Tree Extract Other (See Comments)    HEADACHES, TIRED , DRAINAGE FROM SINUSES  . Molds & Smuts Other (See Comments)    Also dust mites causes sinus infections, h/a etc.    Family History  Problem Relation Age of Onset  . Heart disease Father        MI 79  . Urticaria Father   . Prostate cancer Brother   . Prostate cancer Paternal Uncle   . Prostate cancer Paternal Uncle   . Prostate cancer Paternal Uncle   . Hyperlipidemia Other   . Stroke Other   . Hypertension Other   . ADD / ADHD Other   . Colon cancer Neg Hx   . Allergic rhinitis Neg Hx   . Asthma Neg Hx   . Eczema Neg Hx      Prior to Admission medications   Medication Sig Start Date End Date Taking? Authorizing Provider  allopurinol (ZYLOPRIM) 100 MG tablet Take 50 mg by mouth daily. 12/13/17   [provider]  aspirin 325 MG tablet Take 1 tablet (325 mg total) by mouth daily. 04/28/19   Hosie Poisson, MD  atorvastatin (LIPITOR) 40 MG tablet Take 1 tablet (40 mg total) by mouth daily at 6 PM. 04/27/19   Hosie Poisson, MD  diltiazem (CARDIZEM CD) 360 MG 24 hr capsule Take 1 capsule (360 mg total) by mouth daily. 10/04/18   Larey Dresser, MD  escitalopram (LEXAPRO) 10 MG tablet Take 10 mg by mouth daily.    [provider]  finasteride (PROSCAR) 5 MG tablet Take 1 tablet (5 mg total) by mouth daily. Patient taking differently: Take 5 mg by mouth at bedtime.  12/25/12   Norins, Heinz Knuckles, MD  FLONASE SENSIMIST 27.5 MCG/SPRAY nasal spray Place 1 spray into the nose daily as needed for rhinitis or allergies.  12/11/18   [provider]  ipratropium (ATROVENT) 0.03 % nasal spray Place 2 sprays into both nostrils 2 (two) times daily. 02/05/19   [provider]   latanoprost (XALATAN) 0.005 % ophthalmic solution Place 1 drop into both eyes at bedtime.  01/20/18   [provider]  metolazone (ZAROXOLYN) 2.5 MG tablet Take 1 tablet (2.5 mg total) by mouth once a week. Every Wednesday Patient taking differently: Take 2.5 mg by mouth once a week. Every Thursday, but was instructed to take a dose on Tuesday this week 04/10/19   Larey Dresser, MD  omeprazole (PRILOSEC) 20 MG capsule Take 20 mg by mouth daily.    [provider]  potassium chloride (KLOR-CON) 10 MEQ tablet Take 10 mEq by mouth daily.  [provider]  potassium chloride SA (KLOR-CON) 20 MEQ tablet Take 1 tablet by mouth in the morning, at noon, in the evening, and at bedtime. 04/22/18   [provider]  Riociguat (ADEMPAS) 1.5 MG TABS Take 1.5 mg by mouth 3 (three) times daily.    [provider]  rOPINIRole (REQUIP) 2 MG tablet Take 1 tablet (2 mg total) by mouth in the morning, at noon, and at bedtime. 03/20/19   Kathrynn Ducking, MD  Selexipag 1600 MCG TABS Take 1,600 mcg by mouth 2 (two) times daily.    [provider]  spironolactone (ALDACTONE) 25 MG tablet Take 1 tablet (25 mg total) by mouth daily. 09/27/18 03/20/19  Amin, Jeanella Flattery, MD  torsemide (DEMADEX) 20 MG tablet Take 4 tablets (80 mg total) by mouth 2 (two) times daily. 04/11/19 05/11/19  Larey Dresser, MD  traMADol (ULTRAM) 50 MG tablet Take 50 mg by mouth every 6 (six) hours as needed for moderate pain.     [provider]  traZODone (DESYREL) 100 MG tablet Take 100 mg by mouth at bedtime. 02/12/19   [provider]    Physical Exam: Vitals:   04/30/19 1300 04/30/19 1315 04/30/19 1330 04/30/19 1409  BP: (!) _0 (!) 88/56  Pulse: 87 89 89 85  Resp: (!) 21 (!) _1 Temp:      TempSrc:      SpO2: 95% 95% 91% 94%  Weight:      Height:        Constitutional: NAD, calm, comfortable Vitals:   04/30/19 1300 04/30/19 1315 04/30/19  1330 04/30/19 1409  BP: (!) _2 (!) 88/56  Pulse: 87 89 89 85  Resp: (!) 21 (!) _3 Temp:      TempSrc:      SpO2: 95% 95% 91% 94%  Weight:      Height:       Eyes: PERRL, lids and conjunctivae normal ENMT: Mucous membranes are dry. Posterior pharynx clear of any exudate or lesions.Normal dentition.  Neck: normal, supple, no masses, no thyromegaly Respiratory: clear to auscultation bilaterally, no wheezing, no crackles. Normal respiratory effort. No accessory muscle use.  Cardiovascular: Regular rate and rhythm, no murmurs / rubs / gallops.  2+ extremity edema. 2+ pedal pulses. No carotid bruits.  Abdomen: no tenderness, no masses palpated. No hepatosplenomegaly. Bowel sounds positive.  Musculoskeletal: no clubbing / cyanosis. No joint deformity upper and lower extremities. Good ROM, no contractures. Normal muscle tone.  Skin: no rashes, lesions, ulcers. No induration Neurologic: CN 2-12 grossly intact. Sensation intact, DTR normal. Strength 5/5 in all 4.  Psychiatric: Normal judgment and insight. Alert and oriented x 3. Normal mood.     Labs on Admission: I have personally reviewed following labs and imaging studies  CBC: Recent Labs  Lab 04/24/19 1024 04/25/19 0003 04/30/19 1257  WBC 8.2 7.8 11.3*  NEUTROABS  --   --  9.8*  HGB 12.0* 11.6* 12.5*  HCT 38.2* 36.8* 38.4*  MCV 91.0 90.9 88.7  PLT 288 284 937   Basic Metabolic Panel: Recent Labs  Lab 04/24/19 1024 04/24/19 1024 04/25/19 0003 04/25/19 0428 04/25/19 1544 04/27/19 1140 04/30/19 1257  NA 134*  --   --  134* 134* 136 125*  K 4.6  --   --  3.8 4.5 3.9 5.0  CL 93*  --   --  94* 92* 90* 89*  CO2 28  --   --  30 29 33* 22  GLUCOSE 93  --   --  76 93 117* 116*  BUN 53*  --   --  46* 50* 63* 122*  CREATININE 2.01*   < > 1.84* 1.76* 2.02* 1.98* 5.27*  CALCIUM 8.5*  --   --  8.2* 8.5* 8.7* 7.9*  MG  --   --   --  2.5* 2.6*  --   --    < > = values in this interval not displayed.   GFR:  Estimated Creatinine Clearance: 10.7 mL/min (A) (by C-G formula based on SCr of 5.27 mg/dL (H)). Liver Function Tests: Recent Labs  Lab 04/25/19 0428 04/30/19 1257  AST 19 16  ALT 22 21  ALKPHOS 83 99  BILITOT 0.8 1.3*  PROT 6.0* 7.5  ALBUMIN 3.3* 4.2   Recent Labs  Lab 04/30/19 1257  LIPASE 27   No results for input(s): AMMONIA in the last 168 hours. Coagulation Profile: No results for input(s): INR, PROTIME in the last 168 hours. Cardiac Enzymes: No results for input(s): CKTOTAL, CKMB, CKMBINDEX, TROPONINI in the last 168 hours. BNP (last 3 results) No results for input(s): PROBNP in the last 8760 hours. HbA1C: No results for input(s): HGBA1C in the last 72 hours. CBG: Recent Labs  Lab 04/26/19 1142 04/26/19 1552 04/26/19 2116 04/27/19 0640 04/27/19 1231  GLUCAP 114* 123* 202* 90 120*   Lipid Profile: No results for input(s): CHOL, HDL, LDLCALC, TRIG, CHOLHDL, LDLDIRECT in the last 72 hours. Thyroid Function Tests: No results for input(s): TSH, T4TOTAL, FREET4, T3FREE, THYROIDAB in the last 72 hours. Anemia Panel: No results for input(s): VITAMINB12, FOLATE, FERRITIN, TIBC, IRON, RETICCTPCT in the last 72 hours. Urine analysis:    Component Value Date/Time   COLORURINE STRAW (A) 09/07/2018 1241   APPEARANCEUR CLEAR 09/07/2018 1241   LABSPEC 1.009 09/07/2018 1241   LABSPEC 1.025 06/28/2013 1321   PHURINE 6.0 09/07/2018 1241   GLUCOSEU 50 (A) 09/07/2018 1241   GLUCOSEU Negative 06/28/2013 1321   HGBUR NEGATIVE 09/07/2018 1241   [place] 09/07/2018 1241   BILIRUBINUR Negative 06/28/2013 1321   KETONESUR NEGATIVE 09/07/2018 1241   PROTEINUR NEGATIVE 09/07/2018 1241   UROBILINOGEN 1.0 01/04/2014 2156   UROBILINOGEN 0.2 06/28/2013 1321   NITRITE NEGATIVE 09/07/2018 1241   LEUKOCYTESUR NEGATIVE 09/07/2018 1241   LEUKOCYTESUR Negative 06/28/2013 1321    Radiological Exams on Admission: DG Chest Portable 1 View  Result Date: 04/30/2019  CLINICAL DATA:  Leg swelling EXAM: PORTABLE CHEST 1 VIEW COMPARISON:  04/24/2019 FINDINGS: Low lung volumes. No new consolidation or edema. No pleural effusion or pneumothorax. Large hiatal hernia. IMPRESSION: No acute process in the chest. Electronically Signed   By: Macy Mis M.D.   On: 04/30/2019 14:06    EKG: Independently reviewed.  Chronic RBBB, frequent PVCs.  Assessment/Plan Active Problems:   AKI (acute kidney injury) ([place])   AKI Likely related to overdiuresis and hypotension, will hold for his diuresis and BP meds for now. Check renal ultrasound, check PVR. Check uric acid, continue allopurinol. Case was discussed with Dr. Justin Mend nephrologist over the phone, who plans to see the patient this evening.  No signs of fluid overload, severe electrolyte abnormalities, no indication for dialysis this point.  Hyponatremia with hypovolemia Likely related to AKI and intra vascular depletion, nephrology is going to see and decide whether fluid needed at this point  Acute uremia, likely causing feeling of nauseous From AKI, symptomatic management  CVA Continue aspirin and statin  Paroxysmal  A. Fib Which was reported on his loop recorder set up on last admission, showing paroxysmal A. fib Systemic anticoagulation indicated, but given the fluctuation of kidney function, will postpone starting Eliquis until patient kidney function stabilized. ASA for now, PRN rate control medications.  Pulmonary hypertension Reviewed medication side effect, patient on Riociguat, which can potential drop pt's BP. Thus will hold it for now.  Chronic back pain Will order PT evaluation, outpatient orthopedic follow-up  DVT prophylaxis: Heparin subcu Code Status: DNR  family Communication: Talk to his family/friend at bedside Disposition Plan: Depends on nephrology work-up, likely will need 3 to 4 days hospital stay until kidney function improved Consults called: Dr. Justin Mend nephrology Admission  status: Telemetry admission   Lequita Halt MD [facility] Pager [phone]    04/30/2019, 3:40 PM

## 2019-04-30 NOTE — ED Notes (Signed)
Patient moved to hospital bed

## 2019-04-30 NOTE — ED Notes (Signed)
Pt up to bedside commode

## 2019-04-30 NOTE — Telephone Encounter (Addendum)
Patient answered call but reports he cannot talk due to MD at the door to see him and requested that he receive a call back later today.   Need to discuss LINQ II alert .Patient had loop implant 04/26/19 for cryptogenic stroke. Alert received for AF that was confirmed by Dr Lovena Le. Patient to discontinue ASA and start Eliquis 2.5 mg BID. Follow up will be scheduled with Dr. Lovena Le.

## 2019-04-30 NOTE — Telephone Encounter (Signed)
Patient enroute to [facility] in ambulance. Will contact patient after hospitalization for follow up.

## 2019-05-01 DIAGNOSIS — I272 Pulmonary hypertension, unspecified: Secondary | ICD-10-CM

## 2019-05-01 DIAGNOSIS — I480 Paroxysmal atrial fibrillation: Secondary | ICD-10-CM

## 2019-05-01 DIAGNOSIS — I959 Hypotension, unspecified: Secondary | ICD-10-CM

## 2019-05-01 LAB — BASIC METABOLIC PANEL
Anion gap: 16 — ABNORMAL HIGH (ref 5–15)
BUN: 113 mg/dL — ABNORMAL HIGH (ref 8–23)
CO2: 24 mmol/L (ref 22–32)
Calcium: 7.4 mg/dL — ABNORMAL LOW (ref 8.9–10.3)
Chloride: 89 mmol/L — ABNORMAL LOW (ref 98–111)
Creatinine, Ser: 4 mg/dL — ABNORMAL HIGH (ref 0.61–1.24)
GFR calc Af Amer: 15 mL/min — ABNORMAL LOW (ref >60)
GFR calc non Af Amer: 13 mL/min — ABNORMAL LOW (ref >60)
Glucose, Bld: 75 mg/dL (ref 70–99)
Potassium: 4 mmol/L (ref 3.5–5.1)
Sodium: 129 mmol/L — ABNORMAL LOW (ref 135–145)

## 2019-05-01 LAB — SARS CORONAVIRUS 2 (TAT 6-24 HRS): SARS Coronavirus 2: NEGATIVE

## 2019-05-01 LAB — CBC
HCT: 32.6 % — ABNORMAL LOW (ref 39.0–52.0)
Hemoglobin: 10.7 g/dL — ABNORMAL LOW (ref 13.0–17.0)
MCH: 28.8 pg (ref 26.0–34.0)
MCHC: 32.8 g/dL (ref 30.0–36.0)
MCV: 87.6 fL (ref 80.0–100.0)
Platelets: 265 10*3/uL (ref 150–400)
RBC: 3.72 MIL/uL — ABNORMAL LOW (ref 4.22–5.81)
RDW: 15.2 % (ref 11.5–15.5)
WBC: 8.2 10*3/uL (ref 4.0–10.5)
nRBC: 0 % (ref 0.0–0.2)

## 2019-05-01 NOTE — Evaluation (Signed)
Physical Therapy Evaluation Patient Details Name: Ryan [name] MRN: [id number] DOB: 03-04-1931 Today's Date: 05/01/2019   History of Present Illness  Pt is 86 y.o. male with medical history significant of interstitial lung disease on home oxygen, diastolic CHF prior non-Hodgkin's lymphoma pelvic mass status post radiation therapy, diabetes mellitus type 2, chronic kidney disease stage 4, and anemia. He hospitalized recently for stroke, and was discharged on March 26.  Pt now admitted with AKI and hyponatremia.  Clinical Impression  Pt admitted with above diagnosis. Pt presenting with generalized decreased mobility and strength.  He was able to ambulate in room with min guard and transfer with min A.  Pt educated on transfer techniques to reduce back pain.  Pt from independent living facility but reports it is more like ALF -as they do check on him frequently throughout the day.  Pt currently with functional limitations due to the deficits listed below (see PT Problem List). Pt will benefit from skilled PT to increase their independence and safety with mobility to allow discharge to the venue listed below.       Follow Up Recommendations Home health PT;Supervision for mobility/OOB(Return to ILF)    Equipment Recommendations  None recommended by PT    Recommendations for Other Services       Precautions / Restrictions Precautions Precautions: Fall      Mobility  Bed Mobility Overal bed mobility: Needs Assistance Bed Mobility: Sidelying to Sit;Sit to Sidelying   Sidelying to sit: Min assist     Sit to sidelying: Min assist General bed mobility comments: cues for log roll technique due to chronic back pain; min A for trunk w sitting and for legs with return to supine  Transfers Overall transfer level: Needs assistance Equipment used: Rolling walker (2 wheeled) Transfers: Sit to/from Stand Sit to Stand: Min guard         General transfer comment: performed x 2; increased  time  Ambulation/Gait Ambulation/Gait assistance: Min guard Gait Distance (Feet): 25 Feet(x2) Assistive device: 1 person hand held assist Gait Pattern/deviations: Decreased stride length;Shuffle;Step-through pattern Gait velocity: decr   General Gait Details: Min guard for safety; ambulated at EOB (pt still in ED and unable to find walker for further ambulation); fatigued easily requiring seated rest break  Stairs            Wheelchair Mobility    Modified Rankin (Stroke Patients Only)       Balance Overall balance assessment: Needs assistance Sitting-balance support: No upper extremity supported;Feet supported Sitting balance-Leahy Scale: Good     Standing balance support: Single extremity supported;During functional activity Standing balance-Leahy Scale: Fair                               Pertinent Vitals/Pain Pain Assessment: No/denies pain    Home Living Family/patient expects to be discharged to:: Other (Comment)([facility] wsood indendent living) Living Arrangements: Alone   Type of Home: Independent living facility Home Access: Level entry       Home Equipment: Walker - 4 wheels;Grab bars - toilet;Grab bars - tub/shower;Shower seat Additional Comments: electric cart ( scooter), sleeps in lift chair     Prior Function Level of Independence: Independent with assistive device(s)   Gait / Transfers Assistance Needed: ambulating with Rollator  ADL's / Homemaking Assistance Needed: assist with dressing feet at times  Comments: facility provided wellness checks every 3 hours even in independent living. pt cooks his own meals  and completes his own baths     Hand Dominance        Extremity/Trunk Assessment   Upper Extremity Assessment Upper Extremity Assessment: Overall WFL for tasks assessed    Lower Extremity Assessment RLE Deficits / Details: ROM WFL;  MMT grossly 4/5 throughout RLE Sensation: history of peripheral neuropathy RLE  Coordination: WNL LLE Deficits / Details: ROM WFL;  MMT grossly 4/5 throughout LLE Sensation: history of peripheral neuropathy LLE Coordination: WNL    Cervical / Trunk Assessment Cervical / Trunk Assessment: Kyphotic  Communication   Communication: No difficulties  Cognition Arousal/Alertness: Awake/alert Behavior During Therapy: WFL for tasks assessed/performed   Area of Impairment: Orientation                                      General Comments General comments (skin integrity, edema, etc.): Pt denies new issues from recent stroke-states "It was an incidental finding." ; Reports chronic weakness that is worse at times. Additionally, reports chronic low back pain that varies. States back pain has been relieved by therapy before - from pts description like from soft tissue   Pt's BP was soft at upper 90's-low 100's/60's throughout treatment.  Denied dizziness.  All other VSS.     Exercises     Assessment/Plan    PT Assessment Patient needs continued PT services  PT Problem List Decreased strength;Decreased mobility;Decreased safety awareness;Decreased activity tolerance;Decreased balance;Decreased knowledge of use of DME;Impaired sensation;Cardiopulmonary status limiting activity       PT Treatment Interventions DME instruction;Therapeutic activities;Gait training;Therapeutic exercise;Patient/family education;Balance training;Functional mobility training;Neuromuscular re-education    PT Goals (Current goals can be found in the Care Plan section)  Acute Rehab PT Goals Patient Stated Goal: Return to [facility] at d/c PT Goal Formulation: With patient Time For Goal Achievement: 05/15/19 Potential to Achieve Goals: Good    Frequency Min 3X/week   Barriers to discharge        Co-evaluation               AM-PAC PT "6 Clicks" Mobility  Outcome Measure Help needed turning from your back to your side while in a flat bed without using bedrails?: A  Little   Help needed moving to and from a bed to a chair (including a wheelchair)?: None Help needed standing up from a chair using your arms (e.g., wheelchair or bedside chair)?: None Help needed to walk in hospital room?: A Little Help needed climbing 3-5 steps with a railing? : A Little 6 Click Score: 17    End of Session Equipment Utilized During Treatment: Gait belt;Oxygen Activity Tolerance: Patient tolerated treatment well Patient left: in bed;with call bell/phone within reach;with family/visitor present Nurse Communication: Mobility status PT Visit Diagnosis: Other abnormalities of gait and mobility (R26.89);Muscle weakness (generalized) (M62.81)    Time: 1194-1740 PT Time Calculation (min) (ACUTE ONLY): 35 min   Charges:   PT Evaluation $PT Eval Low Complexity: 1 Low PT Treatments $Gait Training: 8-22 mins        Maggie Font, PT Acute Rehab Services Pager [phone] [facility] [phone] [facility] [phone]   Karlton Lemon 05/01/2019, 5:04 PM

## 2019-05-01 NOTE — ED Notes (Signed)
Tele   bfast ordered  

## 2019-05-01 NOTE — ED Notes (Signed)
Lunch Tray Ordered @ [id number].

## 2019-05-01 NOTE — Progress Notes (Addendum)
PROGRESS NOTE  JOVEN MOM MAU:[id number] DOB: 12-29-1931 DOA: 04/30/2019 PCP: Roetta Sessions, NP  Brief History   FARREN NELLES is a 86 y.o. male with medical history significant of interstitial lung disease on home oxygen, diastolic CHF prior non-Hodgkin's lymphoma pelvic mass status post radiation therapy diabetes mellitus type 2 chronic kidney disease stage IV anemia. He hospitalized recently for stroke, and was discharged on March 26. SPEP performed on discharge was negative for Mspike. Patient started to notice increasing bilateral leg swelling since Friday.  He also has had nausea and has not been able to eat or drink properly. He denies any diarrhea but has had a lot of flatulence. He denies any abdominal pain. No fevers. No vomiting.   His urine output has significant decreased over the last 2 days, with increasing leg swelling, he denied any dysuria.  Patient also reported chronic back pain, aching like, and he reported a lot of muscle cramp around the lower back.  He gained about 2 pounds over the weekend.  Patient reported he continued to take all his blood pressure meds and diuresis medications without interruption despite feeling sick.  In the ED the patient is found to have a creatinine of 5.3 compared to 1.93 a few days ago. He was hypotensive. He was saturating between 84 and 91 % on 2 liters.  [facility] were consulted to admit the patient for further evaluation and care. The patient is still being held in the ED awaiting a telemetry bed upstairs. A renal ultrasound has been obtained and demonstrated no hydronephrosis. There were small renal cysts on the right side. Urine sodium was 26 and Urine osmolality was 318. Sodium was 125. Potassium was 5.0. Anion gap was 14. GFR was 9. Nephrology was consulted. They feel that the patient has ATN. Recommendation is to hold diuretics and nephrotoxic substances. Allow blood pressure to slowly  increase.  Consultants  . Nephrology  Procedures  None  Antibiotics   Anti-infectives (From admission, onward)   None    .  Subjective  The patient is resting comfortably. No new complaints.  Objective   Vitals:  Vitals:   05/01/19 0757 05/01/19 1000  BP: 102/63 106/71  Pulse: 78 (!) 58  Resp: 19 13  Temp:    SpO2: 97% 95%   Exam:  Constitutional:  . The patient is awake, alert, and oriented x 3. No acute distress. Respiratory:  . No increased work of breathing. . No wheezes, rales, or rhonchi . No tactile fremitus Cardiovascular:  . Regular rate and rhythm . No murmurs, ectopy, or gallups. . No lateral PMI. No thrills. Abdomen:  . Abdomen is soft, non-tender, non-distended . No hernias, masses, or organomegaly . Normoactive bowel sounds.  Musculoskeletal:  . No cyanosis, clubbing, or edema Skin:  . No rashes, lesions, ulcers . palpation of skin: no induration or nodules Neurologic:  . CN 2-12 intact . Sensation all 4 extremities intact Psychiatric:  . Mental status o Mood, affect appropriate o Orientation to person, place, time  . judgment and insight appear intact  I have personally reviewed the following:   Today's Data  . Vitals, CMP, CBC  Micro Data  . Blood cultures x 2 has no growth  Imaging  . Renal ultrasound  Scheduled Meds: . allopurinol  50 mg Oral Daily  . aspirin  325 mg Oral Daily  . atorvastatin  40 mg Oral q1800  . escitalopram  10 mg Oral Daily  . finasteride  5 mg Oral QHS  . heparin  5,000 Units Subcutaneous Q12H  . ipratropium  2 spray Each Nare BID  . latanoprost  1 drop Both Eyes QHS  . pantoprazole  40 mg Oral Daily  . rOPINIRole  2 mg Oral TID  . Selexipag  1,600 mcg Oral BID  . traZODone  100 mg Oral QHS   Continuous Infusions: . sodium chloride 1,000 mL (04/30/19 1430)    Active Problems:   Peripheral edema   AKI (acute kidney injury) ([place])   LOS: 1 day   A & P  AKI on CKD IIIb: Due to ATN due to  diuresis and hypotention. Diuretics and blood pressure medications held. Renal ultrasound has been performed and demonstrates no hydronephrosis, but does show small cysts on the right kidney. There is no indication for dialysis at this point.   Hyponatremia with hypovolemia: Due to metolazone use. Will discontinue and follow chemistry. Slowly improving.  Acute uremia: Monitor.   CVA: Continue aspirin and statin.  Paroxysmal A. Fib: Reported by his loop recorder set up on last admission, showing paroxysmal A. Fib. Will start heparin drip pending improvement in renal function. Will transfer to Eliquis until renal function is stable. Will restart rate controlling medications if heart rate increases significantly. Continue to hold for now as they will also effect blood pressures.  Pulmonary hypertension: The patient is taking Riociguat. One adverse reaction to this medication is that it can cause hypotension. It has been held.   Chronic back pain" Will order PT evaluation, outpatient orthopedic follow-up.  I have seen and examined this patient myself. I have spent 34 minutes in his evaluation and care.  DVT prophylaxis: Heparin subcu Code Status: DNR  family Communication: Talk to his family/friend at bedside Disposition Plan: From home. Anticipate discharge to home. Barrier to discharge is clinical improvement in his renal status.  Chessie Neuharth, DO [facility] Direct contact: see [email or web address]  7PM-7AM contact night coverage as above 05/01/2019, 3:10 PM  LOS: 1 day

## 2019-05-01 NOTE — Progress Notes (Signed)
[facility] ROUNDING NOTE   Subjective:   Is an 86 year old gentleman with a history of COPD lymphoma status post radiation treatments type 2 diabetes stage III-IV chronic kidney disease, primary pulmonary hypertension thought to be out of proportion to his lung disease.  2D echo shows severe left ventricular hypertrophy and diastolic dysfunction.  Baseline serum creatinine but 2 mg/dL.  Admitted 04/24/2019 for evaluation of CVA.  Acute punctate infarcts noted in the right occipital territory.  Carotid duplex right ICA 40-59% stenosis left ICA 1-39% stenosis.  Discharge from [facility] 04/27/2019.  Readmitted 04/30/2019.  Increased weakness and lower extremity swelling.  Blood pressure 102/63 pulse 99 temperature afebrile O2 sats 87%.  Sodium 129 potassium 4 chloride 89 CO2 24 BUN 113 creatinine 4 glucose 75 calcium 7.4 hemoglobin 10.7  Medication allopurinol 50 mg daily aspirin 325 mg a Lipitor 40 mg daily Lexapro 10 mg daily Proscar 5 mg daily, Requip 2 mg 3 times daily selexipag 1.6 mcg twice daily trazodone 100 mg daily  Objective:  Vital signs in last 24 hours:  Temp:  [98 F (36.7 C)-98.8 F (37.1 C)] 98.8 F (37.1 C) (03/29 2046) Pulse Rate:  [44-92] 78 (03/30 0757) Resp:  [11-35] 19 (03/30 0757) BP: (78-117)/(46-98) 102/63 (03/30 0757) SpO2:  [88 %-97 %] 97 % (03/30 0757) Weight:  [84.8 kg] 84.8 kg (03/29 1245)  Weight change:  Filed Weights   04/30/19 1245  Weight: 84.8 kg    Intake/Output: I/O last 3 completed shifts: In: -  Out: 225 [Urine:225]   Intake/Output this shift:  Total I/O In: -  Out: 1350 [Urine:1350]   Frail elderly gentleman nondistressed CVS- RRR no murmurs rubs or gallops RS- CTA no wheezes or rales ABD-distended hypoactive EXT-2+ pitting edema   Basic Metabolic Panel: Recent Labs  Lab 04/25/19 0428 04/25/19 0428 04/25/19 1544 04/25/19 1544 04/27/19 1140 04/30/19 1257 05/01/19 0338  NA 134*  --  134*  --  136  125* 129*  K 3.8  --  4.5  --  3.9 5.0 4.0  CL 94*  --  92*  --  90* 89* 89*  CO2 30  --  29  --  33* 22 24  GLUCOSE 76  --  93  --  117* 116* 75  BUN 46*  --  50*  --  63* 122* 113*  CREATININE 1.76*  --  2.02*  --  1.98* 5.27* 4.00*  CALCIUM 8.2*   < > 8.5*   < > 8.7* 7.9* 7.4*  MG 2.5*  --  2.6*  --   --   --   --    < > = values in this interval not displayed.    Liver Function Tests: Recent Labs  Lab 04/25/19 0428 04/30/19 1257  AST 19 16  ALT 22 21  ALKPHOS 83 99  BILITOT 0.8 1.3*  PROT 6.0* 7.5  ALBUMIN 3.3* 4.2   Recent Labs  Lab 04/30/19 1257  LIPASE 27   No results for input(s): AMMONIA in the last 168 hours.  CBC: Recent Labs  Lab 04/24/19 1024 04/25/19 0003 04/30/19 1257 05/01/19 0338  WBC 8.2 7.8 11.3* 8.2  NEUTROABS  --   --  9.8*  --   HGB 12.0* 11.6* 12.5* 10.7*  HCT 38.2* 36.8* 38.4* 32.6*  MCV 91.0 90.9 88.7 87.6  PLT 288 284 304 265    Cardiac Enzymes: No results for input(s): CKTOTAL, CKMB, CKMBINDEX, TROPONINI in the last 168 hours.  BNP: Invalid  input(s): POCBNP  CBG: Recent Labs  Lab 04/26/19 1142 04/26/19 1552 04/26/19 2116 04/27/19 0640 04/27/19 1231  GLUCAP 114* 123* 202* 90 120*    Microbiology: Results for orders placed or performed during the hospital encounter of 04/30/19  SARS CORONAVIRUS 2 (TAT 6-24 HRS) Nasopharyngeal Nasopharyngeal Swab     Status: None   Collection Time: 04/30/19  8:25 PM   Specimen: Nasopharyngeal Swab  Result Value Ref Range Status   SARS Coronavirus 2 NEGATIVE NEGATIVE Final    Comment: (NOTE) SARS-CoV-2 target nucleic acids are NOT DETECTED. The SARS-CoV-2 RNA is generally detectable in upper and lower respiratory specimens during the acute phase of infection. Negative results do not preclude SARS-CoV-2 infection, do not rule out co-infections with other pathogens, and should not be used as the sole basis for treatment or other patient management decisions. Negative results must be  combined with clinical observations, patient history, and epidemiological information. The expected result is Negative. Fact Sheet for Patients: SugarRoll.be Fact Sheet for Healthcare Providers: https://www.woods-mathews.com/ This test is not yet approved or cleared by the [place] FDA and  has been authorized for detection and/or diagnosis of SARS-CoV-2 by FDA under an Emergency Use Authorization (EUA). This EUA will remain  in effect (meaning this test can be used) for the duration of the COVID-19 declaration under Section 56 4(b)(1) of the Act, 21 U.S.C. section 360bbb-3(b)(1), unless the authorization is terminated or revoked sooner. Performed at [facility], [place] [address], [place], [place] [address]     Coagulation Studies: No results for input(s): LABPROT, INR in the last 72 hours.  Urinalysis: Recent Labs    04/30/19 2025  COLORURINE YELLOW  LABSPEC 1.011  PHURINE 5.0  GLUCOSEU NEGATIVE  HGBUR NEGATIVE  BILIRUBINUR NEGATIVE  KETONESUR NEGATIVE  PROTEINUR NEGATIVE  NITRITE NEGATIVE  LEUKOCYTESUR NEGATIVE      Imaging: US RENAL  Result Date: 04/30/2019 CLINICAL DATA:  Acute kidney injury. Stage 2 chronic kidney disease with decreased urine output. EXAM: RENAL / URINARY TRACT ULTRASOUND COMPLETE COMPARISON:  Abdominopelvic CT 03/08/2017. Abdominal ultrasound 05/11/2012. FINDINGS: Right Kidney: Renal measurements: 9.7 x 5.1 x 4.5 cm = volume: 116.8 mL. Mild renal cortical thinning. At least 2 small renal cysts are noted, largest in the upper pole measuring up to 2.6 cm. No evidence of hydronephrosis. Left Kidney: Renal measurements: 11.2 x 5.8 x 6.2 cm = volume: 212.4 mL. Mild renal cortical thinning. No hydronephrosis. Bladder: Appears normal for degree of bladder distention. Other: Study is mildly limited by body habitus and limited breath holding ability. IMPRESSION: Similar appearance of the kidneys with small right  renal cysts and no hydronephrosis. Electronically Signed   By: Richardean Sale M.D.   On: 04/30/2019 17:04   DG Chest Portable 1 View  Result Date: 04/30/2019 CLINICAL DATA:  Leg swelling EXAM: PORTABLE CHEST 1 VIEW COMPARISON:  04/24/2019 FINDINGS: Low lung volumes. No new consolidation or edema. No pleural effusion or pneumothorax. Large hiatal hernia. IMPRESSION: No acute process in the chest. Electronically Signed   By: Macy Mis M.D.   On: 04/30/2019 14:06     Medications:   . sodium chloride 1,000 mL (04/30/19 1430)   . allopurinol  50 mg Oral Daily  . aspirin  325 mg Oral Daily  . atorvastatin  40 mg Oral q1800  . escitalopram  10 mg Oral Daily  . finasteride  5 mg Oral QHS  . heparin  5,000 Units Subcutaneous Q12H  . ipratropium  2 spray Each  Nare BID  . latanoprost  1 drop Both Eyes QHS  . pantoprazole  40 mg Oral Daily  . rOPINIRole  2 mg Oral TID  . Selexipag  1,600 mcg Oral BID  . traZODone  100 mg Oral QHS   acetaminophen **OR** acetaminophen, HYDROmorphone (DILAUDID) injection, ondansetron (ZOFRAN) IV  Assessment/ Plan:  1.Acute on chronic kidney injury Baseline stage IV chronic kidney disease has had progression of renal disease.  Does not appear that there has been any nephrotoxic insult since discharge.  No use of nonsteroidal anti-inflammatory drugs ACE inhibitors or ARB use: No use of IV contrast.  Appears to have hemodynamic instability with some hypotension.  This would point to a diagnosis of probable acute tubular necrosis.  Urine sediment appears bland.  Not a candidate for dialysis.  Patient has been on hospice last year.  Would recommend palliative care consultation.  Renal function seems to be improving with gentle hydration 2. Hypertension/volume  -increased lower extremity swelling.  Will check daily weights.  Low blood pressures at this present time.  Would not continue diuretics.  Would allow blood pressure to gently rise. 3.  Hyponatremia.  Patient  was using thiazide type diuretic metolazone.  Should improve with discontinuation of metolazone.  Appears to be improving 4.  Severe pulmonary hypertension.  As per primary service 5.  COPD oxygen dependent does not appear to be an issue at this time. 6.  History of non-Hodgkin's lymphoma. 7.  Disposition patient is clearly not a candidate for dialysis with systemic hypotension and severe comorbid disorders.  Has already been enrolled in hospice last year.  Would suggest reconsulting palliative medicine.    LOS: [place] _0 _1 :50 AM

## 2019-05-01 NOTE — Progress Notes (Signed)
Patient's wife is present at bedside and was updated.

## 2019-05-02 ENCOUNTER — Encounter (HOSPITAL_COMMUNITY): Payer: Self-pay | Admitting: Internal Medicine

## 2019-05-02 DIAGNOSIS — Z789 Other specified health status: Secondary | ICD-10-CM

## 2019-05-02 DIAGNOSIS — J9611 Chronic respiratory failure with hypoxia: Secondary | ICD-10-CM

## 2019-05-02 DIAGNOSIS — Z8673 Personal history of transient ischemic attack (TIA), and cerebral infarction without residual deficits: Secondary | ICD-10-CM

## 2019-05-02 DIAGNOSIS — E871 Hypo-osmolality and hyponatremia: Secondary | ICD-10-CM

## 2019-05-02 DIAGNOSIS — R5381 Other malaise: Secondary | ICD-10-CM

## 2019-05-02 DIAGNOSIS — N19 Unspecified kidney failure: Secondary | ICD-10-CM

## 2019-05-02 LAB — GLUCOSE, CAPILLARY: Glucose-Capillary: 84 mg/dL (ref 70–99)

## 2019-05-02 MED ORDER — CALCIUM CARBONATE 1250 (500 CA) MG PO TABS
2.0000 | ORAL_TABLET | Freq: Three times a day (TID) | ORAL | Status: DC
  Administered 2019-05-02 – 2019-05-04 (×6): 1000 mg via ORAL
  Filled 2019-05-02 (×7): qty 1

## 2019-05-02 MED ORDER — POLYETHYLENE GLYCOL 3350 17 G PO PACK
17.0000 g | PACK | Freq: Every day | ORAL | Status: DC | PRN
  Administered 2019-05-02: 17 g via ORAL
  Filled 2019-05-02: qty 1

## 2019-05-02 MED ORDER — RIOCIGUAT 1.5 MG PO TABS
1.5000 mg | ORAL_TABLET | Freq: Three times a day (TID) | ORAL | Status: DC
  Administered 2019-05-02 – 2019-05-04 (×6): 1.5 mg via ORAL
  Filled 2019-05-02 (×6): qty 1

## 2019-05-02 NOTE — TOC Initial Note (Signed)
Transition of Care [facility]) - Initial/Assessment Note    Patient Details  Name: Ryan [name] MRN: [id number] Date of Birth: 03/30/1931  Transition of Care [facility]) CM/SW Contact:    Bartholomew Crews, RN Phone Number: [phone] 05/02/2019, 5:46 PM  Clinical Narrative:                 Spoke with patient's spouse at the bedside. PTA patient living at [facility] with [facility] services to assist with medications twice a day and wheelchair transportation. Patient has received assistance in the past with CNA services for daily ADLs and a bath 3 times per week. Spouse states that they may have to resume those services.   Spouse states that she had notified patient's daughters who live in [place], and they are flying in this weekend. Spouse states that she wants hospice at home services and does not want inpatient services when patient is nearing end of life.   Spouse states that she is available for face to face conversation tomorrow from 10:30-3:30.   Noted that patient is followed by [facility] for palliative services, and spouse has already reached out to [facility] about readiness for hospice at home.   TOC following for transition needs.   Expected Discharge Plan: Home w Hospice Care Barriers to Discharge: Continued Medical Work up   Patient Goals and CMS Choice   CMS Medicare.gov Compare Post Acute Care list provided to:: Patient Represenative (must comment)    Expected Discharge Plan and Services Expected Discharge Plan: Home w Hospice Care In-house Referral: Hospice / Palliative Care Discharge Planning Services: CM Consult Post Acute Care Choice: Hospice Living arrangements for the past 2 months: Independent Living Facility([facility])                 DME Arranged: N/A DME Agency: NA                  Prior Living Arrangements/Services Living arrangements for the past 2 months: Independent Living Facility([facility]) Lives with:: Self Patient language and need for  interpreter reviewed:: Yes        Need for Family Participation in Patient Care: Yes (Comment) Care giver support system in place?: Yes (comment) Current home services: DME, Home OT, Home PT Criminal Activity/Legal Involvement Pertinent to Current Situation/Hospitalization: No - Comment as needed  Activities of Daily Living Home Assistive Devices/Equipment: Eyeglasses, Environmental consultant (specify type) ADL Screening (condition at time of admission) Patient's cognitive ability adequate to safely complete daily activities?: Yes Is the patient deaf or have difficulty hearing?: No Does the patient have difficulty seeing, even when wearing glasses/contacts?: No Does the patient have difficulty concentrating, remembering, or making decisions?: No Patient able to express need for assistance with ADLs?: Yes Does the patient have difficulty dressing or bathing?: No Independently performs ADLs?: Yes (appropriate for developmental age) Does the patient have difficulty walking or climbing stairs?: Yes Weakness of Legs: Both Weakness of Arms/Hands: None  Permission Sought/Granted                  Emotional Assessment Appearance:: Appears stated age       Alcohol / Substance Use: Not Applicable Psych Involvement: No (comment)  Admission diagnosis:  Peripheral edema [R60.9] AKI (acute kidney injury) ([place]) [N17.9] Patient Active Problem List   Diagnosis Date Noted  . Acute CVA (cerebrovascular accident) ([place]) 04/24/2019  . Diabetes mellitus type 2 in nonobese ([place]) 04/24/2019  . Acute respiratory failure with hypoxia and hypercarbia ([place]) 09/20/2018  . CKD (chronic  kidney disease) stage 3, GFR 30-59 ml/min 09/20/2018  . Lethargy 09/20/2018  . Acute metabolic encephalopathy 37/62/8315  . CHF (congestive heart failure) ([place]) 09/20/2018  . AKI (acute kidney injury) ([place]) 09/10/2018  . Pulmonary hypertension, unspecified ([place]) 09/10/2018  . Multifocal atrial tachycardia ([place]) 09/10/2018  . CHF  exacerbation ([place]) 09/07/2018  . Leg pain 09/07/2018  . Hyperkalemia 07/26/2018  . Acute on chronic diastolic CHF (congestive heart failure) ([place])   . Bilateral lower extremity edema 02/02/2017  . Pulmonary fibrosis ([place]) 11/22/2016  . Blurry vision, bilateral 10/26/2016  . COPD ([place]) 07/05/2016  . Exertional dyspnea 09/10/2015  . Fatigue/malaise 08/19/2015  . Asthma/COPD 02/12/2015  . 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 Hypoxemia 01/05/2014  . Essential hypertension 01/05/2014  . Hypokalemia 01/05/2014  . Urinary retention 01/05/2014  . Hypoxia   . Lumbar radiculopathy 12/31/2013  . RLS (restless legs syndrome) 09/21/2012  . Chronic lower back pain 06/15/2012  . Abdominal pain, unspecified site 05/19/2012  . Unspecified deficiency anemia 04/07/2012  . Melanoma ([place])   . Non Hodgkin's lymphoma ([place])   . Periaortic lymphadenopathy 02/16/2011  . Barrett's esophagus 07/09/2010  . Personal history of colonic polyps 05/28/2010  . History of IBS 05/28/2010  . Diverticulosis of colon (without mention of hemorrhage) 05/28/2010  . Arthritis involving multiple sites 05/28/2010  . Wheat intolerance 05/28/2010  . Chronic venous insufficiency 08/19/2009  . Peripheral edema 08/19/2009  . INSOMNIA, CHRONIC 12/24/2008  . EUSTACHIAN TUBE DYSFUNCTION, BILATERAL 12/23/2008  . ERECTILE DYSFUNCTION, ORGANIC 08/01/2008  . Allergic rhinitis with a nonallergic component 05/20/2008  . VENTRAL HERNIA 02/12/2008  . PALPITATIONS 12/25/2007  . LACTOSE INTOLERANCE 10/17/2007  . Hyperlipidemia 09/14/2006  . BPH (benign prostatic hyperplasia) 09/14/2006  . OSTEOARTHRITIS 09/14/2006  . HEART MURMUR, HX OF 09/14/2006   PCP:  Roetta Sessions, NP Pharmacy:   [facility] [place] [facility] [place] [address] Phone: [phone] Fax: [phone]  [facility] [place], [place] - [address] [place] [address] Phone: [phone] Fax: [phone]  [facility] [place] [place] [address] [place] [address] Phone: [phone] Fax: [phone]  [id number] [place] [address]  [id number]: [phone]: [phone]    Social Determinants of Health (SDOH) Interventions    Readmission Risk Interventions Readmission Risk Prevention Plan 09/26/2018 09/26/2018 09/12/2018  Transportation Screening Complete - Complete  PCP or Specialist Appt within 3-5 Days - - Complete  HRI or HBasco- - Complete  Social Work Consult for RGatewayPlanning/Counseling - - Complete  Palliative Care Screening - - Not Applicable  Medication Review (Press photographer Complete - Complete  PCP or Specialist appointment within 3-5 days of discharge Complete - -  HBlackstoneor Home Care Consult Complete - -  SW Recovery Care/Counseling Consult Complete - -  Palliative Care Screening Complete Complete -  SRameyComplete Complete -  Some recent data might be hidden

## 2019-05-02 NOTE — Progress Notes (Signed)
   05/02/19 1410  Vitals  Temp 97.8 F (36.6 C)  Temp Source Oral  BP 106/64  MAP (mmHg) 77  BP Method Automatic  Patient Position (if appropriate) Lying  Pulse Rate 91  Pulse Rate Source Monitor  Oxygen Therapy  SpO2 96 %  O2 Device Nasal Cannula  O2 Flow Rate (L/min) 2 L/min  MEWS Score  MEWS Temp 0  MEWS Systolic 0  MEWS Pulse 0  MEWS RR 0  MEWS LOC 0  MEWS Score 0  MEWS Score Color Green  RN called to pt's room by patient. Pt states that he didn't feel well. Rn noticed pt's Right arm is twitching and pt was stuttering. PT states the stuttering and twitching of the arm was what brought him to the hospital. Vitals are stable. Pt is disoriented to age and year. Blood sugar was 80. NIH was 1 for speech. MD Glide notified. Awaiting orders.

## 2019-05-02 NOTE — Progress Notes (Signed)
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  2D echo shows severe left ventricular hypertrophy and diastolic dysfunction.  Baseline serum creatinine but 2 mg/dL.  Admitted 04/24/2019 for evaluation of CVA.  Acute punctate infarcts noted in the right occipital territory.  Carotid duplex right ICA 40-59% stenosis left ICA 1-39% stenosis.  Discharge from [facility] 04/27/2019.  Readmitted 04/30/2019.  Increased weakness and lower extremity swelling.  Blood pressure 112/72 pulse 100 temperature 99 O2 sats 95% 2 L nasal cannula  Sodium 129 potassium 4 chloride 89 CO2 24 BUN 113 creatinine 4 glucose 75 calcium 7.4 albumin 4.2  Medication allopurinol 50 mg daily aspirin 325 mg a Lipitor 40 mg daily Lexapro 10 mg daily Proscar 5 mg daily, Requip 2 mg 3 times daily selexipag 1.6 mcg twice daily trazodone 100 mg daily  Objective:  Vital signs in last 24 hours:  Temp:  [98.3 F (36.8 C)-99 F (37.2 C)] 99 F (37.2 C) (03/31 0448) Pulse Rate:  [58-106] 100 (03/31 0448) Resp:  [12-22] 18 (03/31 0448) BP: (98-129)/(53-96) 112/72 (03/31 0448) SpO2:  [87 %-99 %] 95 % (03/31 0448)  Weight change:  Filed Weights   04/30/19 1245  Weight: 84.8 kg    Intake/Output: I/O last 3 completed shifts: In: 3077.1 [P.O.:300; I.V.:2777.1] Out: 2325 [Urine:2325]   Intake/Output this shift:  No intake/output data recorded.   Frail elderly gentleman nondistressed CVS- RRR no murmurs rubs or gallops RS- CTA no wheezes or rales ABD-distended hypoactive EXT-2+ pitting edema   Basic Metabolic Panel: Recent Labs  Lab 04/25/19 1544 04/25/19 1544 04/27/19 1140 04/30/19 1257 05/01/19 0338  NA 134*  --  136 125* 129*  K 4.5  --  3.9 5.0 4.0  CL 92*  --  90* 89*  89*  CO2 29  --  33* 22 24  GLUCOSE 93  --  117* 116* 75  BUN 50*  --  63* 122* 113*  CREATININE 2.02*  --  1.98* 5.27* 4.00*  CALCIUM 8.5*   < > 8.7* 7.9* 7.4*  MG 2.6*  --   --   --   --    < > = values in this interval not displayed.    Liver Function Tests: Recent Labs  Lab 04/30/19 1257  AST 16  ALT 21  ALKPHOS 99  BILITOT 1.3*  PROT 7.5  ALBUMIN 4.2   Recent Labs  Lab 04/30/19 1257  LIPASE 27   No results for input(s): AMMONIA in the last 168 hours.  CBC: Recent Labs  Lab 04/30/19 1257 05/01/19 0338  WBC 11.3* 8.2  NEUTROABS 9.8*  --   HGB 12.5* 10.7*  HCT 38.4* 32.6*  MCV 88.7 87.6  PLT 304 265    Cardiac Enzymes: No results for input(s): CKTOTAL, CKMB, CKMBINDEX, TROPONINI in the last 168 hours.  BNP: Invalid input(s): POCBNP  CBG: Recent Labs  Lab 04/26/19 1142 04/26/19 1552 04/26/19 2116 04/27/19 0640 04/27/19 1231  GLUCAP 114* 123* 202* 90 120*    Microbiology: Results for orders placed or performed during the hospital encounter of 04/30/19  Blood culture (routine x 2)     Status: None (Preliminary result)   Collection Time: 04/30/19  2:25 PM   Specimen: BLOOD RIGHT HAND  Result Value Ref Range Status  Specimen Description BLOOD RIGHT HAND  Final   Special Requests   Final    BOTTLES DRAWN AEROBIC AND ANAEROBIC Blood Culture adequate volume   Culture   Final    NO GROWTH < 24 HOURS Performed at [facility], [place] [address], [place], [place] [address]    Report Status PENDING  Incomplete  Blood culture (routine x 2)     Status: None (Preliminary result)   Collection Time: 04/30/19  2:40 PM   Specimen: BLOOD LEFT HAND  Result Value Ref Range Status   Specimen Description BLOOD LEFT HAND  Final   Special Requests   Final    BOTTLES DRAWN AEROBIC AND ANAEROBIC Blood Culture results may not be optimal due to an inadequate volume of blood received in culture bottles   Culture   Final    NO GROWTH < 24 HOURS Performed at  [facility], [place] [address] [place], [place] [address]    Report Status PENDING  Incomplete  SARS CORONAVIRUS 2 (TAT 6-24 HRS) Nasopharyngeal Nasopharyngeal Swab     Status: None   Collection Time: 04/30/19  8:25 PM   Specimen: Nasopharyngeal Swab  Result Value Ref Range Status   SARS Coronavirus 2 NEGATIVE NEGATIVE Final    Comment: (NOTE) SARS-CoV-2 target nucleic acids are NOT DETECTED. The SARS-CoV-2 RNA is generally detectable in upper and lower respiratory specimens during the acute phase of infection. Negative results do not preclude SARS-CoV-2 infection, do not rule out co-infections with other pathogens, and should not be used as the sole basis for treatment or other patient management decisions. Negative results must be combined with clinical observations, patient history, and epidemiological information. The expected result is Negative. Fact Sheet for Patients: SugarRoll.be Fact Sheet for Healthcare Providers: https://www.woods-mathews.com/ This test is not yet approved or cleared by the [place] FDA and  has been authorized for detection and/or diagnosis of SARS-CoV-2 by FDA under an Emergency Use Authorization (EUA). This EUA will remain  in effect (meaning this test can be used) for the duration of the COVID-19 declaration under Section 56 4(b)(1) of the Act, 21 U.S.C. section 360bbb-3(b)(1), unless the authorization is terminated or revoked sooner. Performed at [facility], [place] [address] [place], [place] [address]     Coagulation Studies: No results for input(s): LABPROT, INR in the last 72 hours.  Urinalysis: Recent Labs    04/30/19 2025  COLORURINE YELLOW  LABSPEC 1.011  PHURINE 5.0  GLUCOSEU NEGATIVE  HGBUR NEGATIVE  BILIRUBINUR NEGATIVE  KETONESUR NEGATIVE  PROTEINUR NEGATIVE  NITRITE NEGATIVE  LEUKOCYTESUR NEGATIVE      Imaging: US RENAL  Result Date: 04/30/2019 CLINICAL DATA:   Acute kidney injury. Stage 2 chronic kidney disease with decreased urine output. EXAM: RENAL / URINARY TRACT ULTRASOUND COMPLETE COMPARISON:  Abdominopelvic CT 03/08/2017. Abdominal ultrasound 05/11/2012. FINDINGS: Right Kidney: Renal measurements: 9.7 x 5.1 x 4.5 cm = volume: 116.8 mL. Mild renal cortical thinning. At least 2 small renal cysts are noted, largest in the upper pole measuring up to 2.6 cm. No evidence of hydronephrosis. Left Kidney: Renal measurements: 11.2 x 5.8 x 6.2 cm = volume: 212.4 mL. Mild renal cortical thinning. No hydronephrosis. Bladder: Appears normal for degree of bladder distention. Other: Study is mildly limited by body habitus and limited breath holding ability. IMPRESSION: Similar appearance of the kidneys with small right renal cysts and no hydronephrosis. Electronically Signed   By: Richardean Sale M.D.   On: 04/30/2019 17:04  DG Chest Portable 1 View  Result Date: 04/30/2019 CLINICAL DATA:  Leg swelling EXAM: PORTABLE CHEST 1 VIEW COMPARISON:  04/24/2019 FINDINGS: Low lung volumes. No new consolidation or edema. No pleural effusion or pneumothorax. Large hiatal hernia. IMPRESSION: No acute process in the chest. Electronically Signed   By: Macy Mis M.D.   On: 04/30/2019 14:06     Medications:   . sodium chloride 1,000 mL (05/02/19 0001)   . allopurinol  50 mg Oral Daily  . aspirin  325 mg Oral Daily  . atorvastatin  40 mg Oral q1800  . escitalopram  10 mg Oral Daily  . finasteride  5 mg Oral QHS  . heparin  5,000 Units Subcutaneous Q12H  . ipratropium  2 spray Each Nare BID  . latanoprost  1 drop Both Eyes QHS  . pantoprazole  40 mg Oral Daily  . rOPINIRole  2 mg Oral TID  . Selexipag  1,600 mcg Oral BID  . traZODone  100 mg Oral QHS   acetaminophen **OR** acetaminophen, HYDROmorphone (DILAUDID) injection, ondansetron (ZOFRAN) IV  Assessment/ Plan:  1.Acute on chronic kidney injury Baseline stage IV chronic kidney disease has had progression of  renal disease.  Does not appear that there has been any nephrotoxic insult since discharge.  No use of nonsteroidal anti-inflammatory drugs ACE inhibitors or ARB use: No use of IV contrast.  Appears to have hemodynamic instability with some hypotension.  This would point to a diagnosis of probable acute tubular necrosis.  Urine sediment appears bland.  Not a candidate for dialysis.  Patient has been on hospice last year.  Would recommend palliative care consultation.  Renal function seems to be improving with gentle hydration 2. Hypertension/volume  -increased lower extremity swelling.  Will check daily weights.  Low blood pressures at this present time.  Would not continue diuretics.  Would allow blood pressure to gently rise. 3.  Hyponatremia.  Patient was using thiazide type diuretic metolazone.  Should improve with discontinuation of metolazone.  Appears to be improving 4.  Severe pulmonary hypertension.  As per primary service 5.  COPD oxygen dependent does not appear to be an issue at this time. 6.  History of non-Hodgkin's lymphoma. 7.  Disposition patient is clearly not a candidate for dialysis with systemic hypotension and severe comorbid disorders.  Has already been enrolled in hospice last year.  Would suggest reconsulting palliative medicine. 8.  Hypocalcemia would recommend starting Tums 2 tablets 3 times daily.  Not much more to add will sign off patient at this time.  Please call back if needed.  LOS: [place] _0 _1 :21 AM

## 2019-05-02 NOTE — Plan of Care (Signed)
  Problem: Nutritional: Goal: Ability to make appropriate dietary choices will improve Outcome: Progressing

## 2019-05-02 NOTE — Progress Notes (Addendum)
PROGRESS NOTE  Ryan [name] PZW:[id number] DOB: September 25, 1931   PCP: Roetta Sessions, NP  Patient is from: Home.  DOA: 04/30/2019 LOS: 2  Brief Narrative / Interim history: 86 year old male with history of ILD on home oxygen, diastolic CHF, NHL pelvic mass s/p radiation therapy, DM-2, CKD-4, anemia of renal disease and recent hospitalization for CVA presenting with bilateral LE swelling, generalized weakness, nausea and poor p.o. intake.  He was admitted for acute on chronic renal failure with significant azotemia. Cr 5.3 (baseline 1.9) and BUN of 116.  Started on IV fluid.  Nephrology consulted.  Subjective: Seen and examined earlier this morning, and this afternoon.  No major events overnight or early this morning.  However, patient had an episode of stuttering and twitching this afternoon.  He denies headache, focal weakness, numbness or tingling.  Denies chest pain, dyspnea, GI or UTI symptoms.   Objective: Vitals:   05/02/19 0448 05/02/19 0846 05/02/19 1230 05/02/19 1410  BP: 112/72 113/63 (!) 98/59 106/64  Pulse: 100 80 90 91  Resp: _0 Temp: 99 F (37.2 C) 97.8 F (36.6 C) 97.6 F (36.4 C) 97.8 F (36.6 C)  TempSrc: Oral Oral Oral Oral  SpO2: 95% 96% 95% 96%  Weight:      Height:        Intake/Output Summary (Last 24 hours) at 05/02/2019 1507 Last data filed at 05/02/2019 1300 Gross per 24 hour  Intake 3720 ml  Output 3475 ml  Net 245 ml   Filed Weights   04/30/19 1245  Weight: 84.8 kg    Examination:  GENERAL: No acute distress.  Appears well.  HEENT: MMM.  Vision and hearing grossly intact.  NECK: Supple.  No apparent JVD.  RESP:  No IWOB. Good air movement bilaterally. CVS: Irregular rhythm.  Normal rate.  Heart sounds normal.  ABD/GI/GU: Bowel sounds present. Soft. Non tender.  MSK/EXT:  Moves extremities. No apparent deformity. No edema.  SKIN: no apparent skin lesion or wound NEURO: Awake, alert and oriented self, place, year and  person.  Some episodic stuttering and whole body twitching. CN 2-12 grossly intact.  PERRL.  EOMI.  No facial asymmetry.  Motor 5/5 in all extremities.  Light sensation intact in all dermatomes.  Patellar reflex symmetry.  No pronator drift.  Finger-to-nose intact. PSYCH: Calm. Normal affect.  Procedures:  None  Assessment & Plan: AKI on CKD-4 with azotemia: Concern for ATN in the setting of diuretics and hypotension.  Renal US without hydronephrosis.  Baseline Cr 2.0> 5.27> 4.0. BUN 122>113.  Patient seems to have uremic symptoms with stuttering speech and episodic generalized body twitching and some mental status change.  Per nephrology, patient is not a candidate for dialysis with systemic hypertension and severe comorbidity. Has been enrolled in hospice last year. -Nephrology following-recommended palliative care consult and continue gentle IV fluid -Avoid nephrotoxic meds -Continue monitoring  History of CVA: Hospitalized 3/23-3/26 for acute punctate infarct in the right occipital area.  Echo with severe LVH.  CUS with 40 to 59% right ICA stenosis.  Loop recorder revealed A. fib.  On Cardizem, aspirin and statin.  Neuro exam within normal except for stuttering and twitching likely from uremia. -Continue home aspirin and statin  Paroxysmal A. fib: Rate controlled without medication.  On Cardizem and full dose aspirin at home. -Continue home aspirin  Hypovolemic hyponatremia: Likely due to renal failure. -Check urine chemistry -Continue IV normal saline as above  Hypocalcemia -Tums 2 tablets 3 times  a day per nephrology  History of pulmonary hypertension: Recent echo on 3/24 with normal pulmonary hypertension. -Continue home Adempas and selexipag  Chronic COPD/ILD/chronic RF on 2 L at baseline: Stable. -As needed breathing treatments  Acute on chronic respiratory failure with hypoxia: Multifactorial including COPD, ILD and pulmonary hypertension.  Improved.  He is back on 2 L by  nasal cannula. -Encourage incentive spirometry -Treat COPD and pulmonary hypertension as above  BPH: No UTI symptoms. -Continue home Proscar -Monitor urine output  Debility -PT/OT  DNR/DNI/goal of care-enrolled with hospice previously. -Palliative care consulted                 DVT prophylaxis: Subcu heparin Code Status: DNR/DNI Family Communication: Patient and/or RN. Available if any question.  Discharge barrier: Acute renal failure with uremia requiring IV fluid Patient is from: Home Final disposition: To be determined  Consultants: Nephrology, palliative care   Microbiology summarized: [id number] negative Blood cultures negative  Sch Meds:  Scheduled Meds:  allopurinol  50 mg Oral Daily   aspirin  325 mg Oral Daily   atorvastatin  40 mg Oral q1800   calcium carbonate  2 tablet Oral TID WC   escitalopram  10 mg Oral Daily   finasteride  5 mg Oral QHS   heparin  5,000 Units Subcutaneous Q12H   ipratropium  2 spray Each Nare BID   latanoprost  1 drop Both Eyes QHS   pantoprazole  40 mg Oral Daily   Riociguat  1.5 mg Oral TID   rOPINIRole  2 mg Oral TID   Selexipag  1,600 mcg Oral BID   traZODone  100 mg Oral QHS   Continuous Infusions:  sodium chloride 1,000 mL (05/02/19 0001)   PRN Meds:.acetaminophen **OR** acetaminophen, HYDROmorphone (DILAUDID) injection, ondansetron (ZOFRAN) IV, polyethylene glycol  Antimicrobials: Anti-infectives (From admission, onward)    None        I have personally reviewed the following labs and images: CBC: Recent Labs  Lab 04/30/19 1257 05/01/19 0338  WBC 11.3* 8.2  NEUTROABS 9.8*  --   HGB 12.5* 10.7*  HCT 38.4* 32.6*  MCV 88.7 87.6  PLT 304 265   BMP &GFR Recent Labs  Lab 04/25/19 1544 04/27/19 1140 04/30/19 1257 05/01/19 0338  NA 134* 136 125* 129*  K 4.5 3.9 5.0 4.0  CL 92* 90* 89* 89*  CO2 29 33* 22 24  GLUCOSE 93 117* 116* 75  BUN 50* 63* 122* 113*  CREATININE 2.02* 1.98* 5.27* 4.00*    CALCIUM 8.5* 8.7* 7.9* 7.4*  MG 2.6*  --   --   --    Estimated Creatinine Clearance: 14.1 mL/min (A) (by C-G formula based on SCr of 4 mg/dL (H)). Liver & Pancreas: Recent Labs  Lab 04/30/19 1257  AST 16  ALT 21  ALKPHOS 99  BILITOT 1.3*  PROT 7.5  ALBUMIN 4.2   Recent Labs  Lab 04/30/19 1257  LIPASE 27   No results for input(s): AMMONIA in the last 168 hours. Diabetic: No results for input(s): HGBA1C in the last 72 hours. Recent Labs  Lab 04/26/19 1552 04/26/19 2116 04/27/19 0640 04/27/19 1231 05/02/19 1420  GLUCAP 123* 202* 90 120* 84   Cardiac Enzymes: No results for input(s): CKTOTAL, CKMB, CKMBINDEX, TROPONINI in the last 168 hours. No results for input(s): PROBNP in the last 8760 hours. Coagulation Profile: No results for input(s): INR, PROTIME in the last 168 hours. Thyroid Function Tests: No results for input(s): TSH, T4TOTAL, FREET4, T3FREE,  THYROIDAB in the last 72 hours. Lipid Profile: No results for input(s): CHOL, HDL, LDLCALC, TRIG, CHOLHDL, LDLDIRECT in the last 72 hours. Anemia Panel: No results for input(s): VITAMINB12, FOLATE, FERRITIN, TIBC, IRON, RETICCTPCT in the last 72 hours. Urine analysis:    Component Value Date/Time   COLORURINE YELLOW 04/30/2019 2025   APPEARANCEUR CLEAR 04/30/2019 2025   LABSPEC 1.011 04/30/2019 2025   LABSPEC 1.025 06/28/2013 1321   PHURINE 5.0 04/30/2019 2025   GLUCOSEU NEGATIVE 04/30/2019 2025   GLUCOSEU Negative 06/28/2013 1321   HGBUR NEGATIVE 04/30/2019 2025   BILIRUBINUR NEGATIVE 04/30/2019 2025   BILIRUBINUR Negative 06/28/2013 1321   KETONESUR NEGATIVE 04/30/2019 2025   PROTEINUR NEGATIVE 04/30/2019 2025   UROBILINOGEN 1.0 01/04/2014 2156   UROBILINOGEN 0.2 06/28/2013 1321   NITRITE NEGATIVE 04/30/2019 2025   LEUKOCYTESUR NEGATIVE 04/30/2019 2025   LEUKOCYTESUR Negative 06/28/2013 1321   Sepsis Labs: Invalid input(s): PROCALCITONIN, [place]  Microbiology: Recent Results (from the past 240  hour(s))  SARS CORONAVIRUS 2 (TAT 6-24 HRS) Nasopharyngeal Nasopharyngeal Swab     Status: None   Collection Time: 04/24/19 10:54 PM   Specimen: Nasopharyngeal Swab  Result Value Ref Range Status   SARS Coronavirus 2 NEGATIVE NEGATIVE Final    Comment: (NOTE) SARS-CoV-2 target nucleic acids are NOT DETECTED. The SARS-CoV-2 RNA is generally detectable in upper and lower respiratory specimens during the acute phase of infection. Negative results do not preclude SARS-CoV-2 infection, do not rule out co-infections with other pathogens, and should not be used as the sole basis for treatment or other patient management decisions. Negative results must be combined with clinical observations, patient history, and epidemiological information. The expected result is Negative. Fact Sheet for Patients: SugarRoll.be Fact Sheet for Healthcare Providers: https://www.woods-mathews.com/ This test is not yet approved or cleared by the [place] FDA and  has been authorized for detection and/or diagnosis of SARS-CoV-2 by FDA under an Emergency Use Authorization (EUA). This EUA will remain  in effect (meaning this test can be used) for the duration of the COVID-19 declaration under Section 56 4(b)(1) of the Act, 21 U.S.C. section 360bbb-3(b)(1), unless the authorization is terminated or revoked sooner. Performed at [facility], [place] [address] [place], [place] [address]   MRSA PCR Screening     Status: None   Collection Time: 04/25/19  2:04 AM   Specimen: Nasal Mucosa; Nasopharyngeal  Result Value Ref Range Status   MRSA by PCR NEGATIVE NEGATIVE Final    Comment:        The GeneXpert MRSA Assay (FDA approved for NASAL specimens only), is one component of a comprehensive MRSA colonization surveillance program. It is not intended to diagnose MRSA infection nor to guide or monitor treatment for MRSA infections. Performed at [place], [place] [address], [place], [place] [address]   Blood culture (routine x 2)     Status: None (Preliminary result)   Collection Time: 04/30/19  2:25 PM   Specimen: BLOOD RIGHT HAND  Result Value Ref Range Status   Specimen Description BLOOD RIGHT HAND  Final   Special Requests   Final    BOTTLES DRAWN AEROBIC AND ANAEROBIC Blood Culture adequate volume   Culture   Final    NO GROWTH 2 DAYS Performed at [facility], [place] [address], [place], [place] [address]    Report Status PENDING  Incomplete  Blood culture (routine x 2)     Status: None (Preliminary result)   Collection Time:  04/30/19  2:40 PM   Specimen: BLOOD LEFT HAND  Result Value Ref Range Status   Specimen Description BLOOD LEFT HAND  Final   Special Requests   Final    BOTTLES DRAWN AEROBIC AND ANAEROBIC Blood Culture results may not be optimal due to an inadequate volume of blood received in culture bottles   Culture   Final    NO GROWTH 2 DAYS Performed at [facility], [place] [address] [place], [place] [address]    Report Status PENDING  Incomplete  SARS CORONAVIRUS 2 (TAT 6-24 HRS) Nasopharyngeal Nasopharyngeal Swab     Status: None   Collection Time: 04/30/19  8:25 PM   Specimen: Nasopharyngeal Swab  Result Value Ref Range Status   SARS Coronavirus 2 NEGATIVE NEGATIVE Final    Comment: (NOTE) SARS-CoV-2 target nucleic acids are NOT DETECTED. The SARS-CoV-2 RNA is generally detectable in upper and lower respiratory specimens during the acute phase of infection. Negative results do not preclude SARS-CoV-2 infection, do not rule out co-infections with other pathogens, and should not be used as the sole basis for treatment or other patient management decisions. Negative results must be combined with clinical observations, patient history, and epidemiological information. The expected result is Negative. Fact Sheet for Patients: SugarRoll.be Fact Sheet for Healthcare  Providers: https://www.woods-mathews.com/ This test is not yet approved or cleared by the [place] FDA and  has been authorized for detection and/or diagnosis of SARS-CoV-2 by FDA under an Emergency Use Authorization (EUA). This EUA will remain  in effect (meaning this test can be used) for the duration of the COVID-19 declaration under Section 56 4(b)(1) of the Act, 21 U.S.C. section 360bbb-3(b)(1), unless the authorization is terminated or revoked sooner. Performed at [facility], [place] [address], [place], [place] [address]     Radiology Studies: No results found.   45 minutes with more than 50% spent in reviewing records, counseling patient/family and coordinating care.  Batul Diego T. [place]  If 7PM-7AM, please contact night-coverage [email or web address] Password [facility] 05/02/2019, 3:07 PM

## 2019-05-02 NOTE — Progress Notes (Signed)
Physical Therapy Treatment Patient Details Name: Ryan [name] MRN: [id number] DOB: [birth year] Today's Date: 05/02/2019    History of Present Illness Pt is 86 y.o. male with medical history significant of interstitial lung disease on home oxygen, diastolic CHF prior non-Hodgkin's lymphoma pelvic mass status post radiation therapy, diabetes mellitus type 2, chronic kidney disease stage 4, and anemia. He hospitalized recently for stroke, and was discharged on March 26.  Pt now admitted with AKI and hyponatremia.    PT Comments    Pt resting upon arrival to room, agreeable to OOB mobility. Pt ambulated increased hallway distance today vs yesterday, pt with use of RW and required mod verbal cuing for form and safety. Pt expressing during session he feels weaker vs baseline, states "being in here (the hospital) makes you weak". PT spoke with pt's wife Ryan [name] per pt request, as both pt and wife are confused about pt's plan of care. PT relayed messaged to RN. Will continue to follow acutely.    Follow Up Recommendations  Home health PT;Supervision for mobility/OOB(Return to ILF)     Equipment Recommendations  None recommended by PT    Recommendations for Other Services       Precautions / Restrictions Precautions Precautions: Fall Restrictions Weight Bearing Restrictions: No    Mobility  Bed Mobility Overal bed mobility: Needs Assistance Bed Mobility: Sidelying to Sit;Sit to Sidelying   Sidelying to sit: Min guard     Sit to sidelying: Min guard General bed mobility comments: min guard for safety, pt with use of good log roll technique for back protection. Increased time and effort.  Transfers Overall transfer level: Needs assistance Equipment used: Rolling walker (2 wheeled) Transfers: Sit to/from Stand Sit to Stand: Min guard         General transfer comment: min guard for safety, increased time to rise and steady.  Ambulation/Gait Ambulation/Gait assistance: Min  guard Gait Distance (Feet): 100 Feet Assistive device: Rolling walker (2 wheeled) Gait Pattern/deviations: Decreased stride length;Shuffle;Step-through pattern;Trunk flexed Gait velocity: decr   General Gait Details: min guard for safety, verbal cuing for placement in RW, upright posture multiple times. Pt with difficulty navigating RW, states "this thing has a mind of its own"   Stairs             Wheelchair Mobility    Modified Rankin (Stroke Patients Only)       Balance Overall balance assessment: Needs assistance Sitting-balance support: No upper extremity supported;Feet supported Sitting balance-Leahy Scale: Good     Standing balance support: Single extremity supported;During functional activity Standing balance-Leahy Scale: Fair                              Cognition Arousal/Alertness: Awake/alert Behavior During Therapy: WFL for tasks assessed/performed Overall Cognitive Status: History of cognitive impairments - at baseline                               Problem Solving: Slow processing;Requires verbal cues;Requires tactile cues        Exercises General Exercises - Lower Extremity Hip Flexion/Marching: AROM;Both;10 reps;Standing Mini-Sqauts: AROM;Both;10 reps;Standing    General Comments        Pertinent Vitals/Pain Pain Assessment: Faces Faces Pain Scale: No hurt Pain Intervention(s): Limited activity within patient's tolerance    Home Living Family/patient expects to be discharged to:: Assisted living Living Arrangements: Alone  Prior Function            PT Goals (current goals can now be found in the care plan section) Acute Rehab PT Goals Patient Stated Goal: Return to [facility] at d/c PT Goal Formulation: With patient Time For Goal Achievement: 05/15/19 Potential to Achieve Goals: Good Progress towards PT goals: Progressing toward goals    Frequency    Min 3X/week      PT  Plan Current plan remains appropriate    Co-evaluation              AM-PAC PT "6 Clicks" Mobility   Outcome Measure  Help needed turning from your back to your side while in a flat bed without using bedrails?: A Little Help needed moving from lying on your back to sitting on the side of a flat bed without using bedrails?: A Little Help needed moving to and from a bed to a chair (including a wheelchair)?: A Little Help needed standing up from a chair using your arms (e.g., wheelchair or bedside chair)?: A Little Help needed to walk in hospital room?: A Little Help needed climbing 3-5 steps with a railing? : A Little 6 Click Score: 18    End of Session Equipment Utilized During Treatment: Gait belt Activity Tolerance: Patient tolerated treatment well Patient left: in bed;with call bell/phone within reach;with nursing/sitter in room Nurse Communication: Mobility status PT Visit Diagnosis: Other abnormalities of gait and mobility (R26.89);Muscle weakness (generalized) (M62.81)     Time: 5027-7412 PT Time Calculation (min) (ACUTE ONLY): 34 min  Charges:  $Gait Training: 8-22 mins $Therapeutic Activity: 8-22 mins                     Dantae Meunier E, PT Acute Rehabilitation Services Pager [phone]  Office [place] 05/02/2019, 1:01 PM

## 2019-05-02 NOTE — Progress Notes (Addendum)
Hydrologist [facility])  Hospital Liaison: RN note         This is current Palliative Patient with [facility]. Family reached out to notify [facility] they would like hospice support when the patient returns to [facility].          [place] liaison will follow to coordinate hospice services upon discharge.  Please call with any hospice or palliative related questions.         Thank you for this referral.         Farrel Gordon, RN, CCM  [place] (listed on [place] under Hospice)    [phone]

## 2019-05-03 DIAGNOSIS — Z515 Encounter for palliative care: Secondary | ICD-10-CM

## 2019-05-03 DIAGNOSIS — G8929 Other chronic pain: Secondary | ICD-10-CM

## 2019-05-03 DIAGNOSIS — D631 Anemia in chronic kidney disease: Secondary | ICD-10-CM

## 2019-05-03 DIAGNOSIS — N179 Acute kidney failure, unspecified: Secondary | ICD-10-CM

## 2019-05-03 DIAGNOSIS — N189 Chronic kidney disease, unspecified: Secondary | ICD-10-CM

## 2019-05-03 DIAGNOSIS — Z66 Do not resuscitate: Secondary | ICD-10-CM

## 2019-05-03 DIAGNOSIS — R609 Edema, unspecified: Secondary | ICD-10-CM

## 2019-05-03 DIAGNOSIS — M545 Low back pain: Secondary | ICD-10-CM

## 2019-05-03 DIAGNOSIS — Z7189 Other specified counseling: Secondary | ICD-10-CM

## 2019-05-03 LAB — CBC
HCT: 35 % — ABNORMAL LOW (ref 39.0–52.0)
Hemoglobin: 10.8 g/dL — ABNORMAL LOW (ref 13.0–17.0)
MCH: 28.8 pg (ref 26.0–34.0)
MCHC: 30.9 g/dL (ref 30.0–36.0)
MCV: 93.3 fL (ref 80.0–100.0)
Platelets: 253 10*3/uL (ref 150–400)
RBC: 3.75 MIL/uL — ABNORMAL LOW (ref 4.22–5.81)
RDW: 15.6 % — ABNORMAL HIGH (ref 11.5–15.5)
WBC: 5.7 10*3/uL (ref 4.0–10.5)
nRBC: 0 % (ref 0.0–0.2)

## 2019-05-03 LAB — BASIC METABOLIC PANEL
Anion gap: 10 (ref 5–15)
BUN: 21 mg/dL (ref 8–23)
CO2: 25 mmol/L (ref 22–32)
Calcium: 8.7 mg/dL — ABNORMAL LOW (ref 8.9–10.3)
Chloride: 106 mmol/L (ref 98–111)
Creatinine, Ser: 1 mg/dL (ref 0.61–1.24)
GFR calc Af Amer: >60 mL/min (ref >60)
GFR calc non Af Amer: >60 mL/min (ref >60)
Glucose, Bld: 92 mg/dL (ref 70–99)
Potassium: 4.6 mmol/L (ref 3.5–5.1)
Sodium: 141 mmol/L (ref 135–145)

## 2019-05-03 LAB — IRON AND TIBC
Iron: 72 ug/dL (ref 45–182)
Saturation Ratios: 21 % (ref 17.9–39.5)
TIBC: 344 ug/dL (ref 250–450)
UIBC: 272 ug/dL

## 2019-05-03 LAB — MAGNESIUM: Magnesium: 2.1 mg/dL (ref 1.7–2.4)

## 2019-05-03 LAB — FERRITIN: Ferritin: 70 ng/mL (ref 24–336)

## 2019-05-03 LAB — RENAL FUNCTION PANEL
Albumin: 2.8 g/dL — ABNORMAL LOW (ref 3.5–5.0)
Anion gap: 10 (ref 5–15)
BUN: 26 mg/dL — ABNORMAL HIGH (ref 8–23)
CO2: 26 mmol/L (ref 22–32)
Calcium: 8.1 mg/dL — ABNORMAL LOW (ref 8.9–10.3)
Chloride: 105 mmol/L (ref 98–111)
Creatinine, Ser: 1 mg/dL (ref 0.61–1.24)
GFR calc Af Amer: >60 mL/min (ref >60)
GFR calc non Af Amer: >60 mL/min (ref >60)
Glucose, Bld: 105 mg/dL — ABNORMAL HIGH (ref 70–99)
Phosphorus: 2.2 mg/dL — ABNORMAL LOW (ref 2.5–4.6)
Potassium: 4.3 mmol/L (ref 3.5–5.1)
Sodium: 141 mmol/L (ref 135–145)

## 2019-05-03 LAB — RETICULOCYTES
Immature Retic Fract: 8.5 % (ref 2.3–15.9)
RBC.: 4.29 MIL/uL (ref 4.22–5.81)
Retic Count, Absolute: 72.9 10*3/uL (ref 19.0–186.0)
Retic Ct Pct: 1.7 % (ref 0.4–3.1)

## 2019-05-03 LAB — GLUCOSE, CAPILLARY: Glucose-Capillary: 92 mg/dL (ref 70–99)

## 2019-05-03 LAB — VITAMIN B12: Vitamin B-12: 192 pg/mL (ref 180–914)

## 2019-05-03 LAB — FOLATE: Folate: 9.1 ng/mL (ref >5.9)

## 2019-05-03 MED ORDER — DILTIAZEM HCL 60 MG PO TABS
30.0000 mg | ORAL_TABLET | Freq: Three times a day (TID) | ORAL | Status: DC
  Administered 2019-05-03 – 2019-05-04 (×3): 30 mg via ORAL
  Filled 2019-05-03 (×3): qty 1

## 2019-05-03 NOTE — Evaluation (Signed)
Occupational Therapy Evaluation Patient Details Name: Ryan [name] MRN: [id number] DOB: [birth year] Today's Date: 05/03/2019    History of Present Illness Pt is 86 y.o. male with medical history significant of interstitial lung disease on home oxygen, diastolic CHF prior non-Hodgkin's lymphoma pelvic mass status post radiation therapy, diabetes mellitus type 2, chronic kidney disease stage 4, and anemia. He hospitalized recently for stroke, and was discharged on March 26.  Pt now admitted with AKI and hyponatremia.   Clinical Impression   Pt lives alone at [facility] with wellness checks by staff and some meals provided. He walks with a rollator and can prepare simple meals. He struggles at times with LB ADL. Pt uses 2L 02 at home. Pt presents with generalized weakness and impaired balance. He is receptive to acute OT to address ADL in preparation for return to his home.     Follow Up Recommendations  Home health OT    Equipment Recommendations  None recommended by OT    Recommendations for Other Services       Precautions / Restrictions Precautions Precautions: Fall Restrictions Weight Bearing Restrictions: No      Mobility Bed Mobility Overal bed mobility: Needs Assistance Bed Mobility: Rolling;Sidelying to Sit;Sit to Sidelying Rolling: Min guard Sidelying to sit: Min guard     Sit to sidelying: Min guard General bed mobility comments: cues for technique  Transfers Overall transfer level: Needs assistance Equipment used: Rolling walker (2 wheeled) Transfers: Sit to/from Stand Sit to Stand: Min guard              Balance     Sitting balance-Leahy Scale: Good       Standing balance-Leahy Scale: Fair Standing balance comment: static standing                           ADL either performed or assessed with clinical judgement   ADL Overall ADL's : Needs assistance/impaired Eating/Feeding: Independent   Grooming: Min guard;Standing    Upper Body Bathing: Set up;Sitting   Lower Body Bathing: Minimal assistance;Sit to/from stand   Upper Body Dressing : Set up;Sitting   Lower Body Dressing: Minimal assistance;Sit to/from stand   Toilet Transfer: Min guard;Ambulation;BSC;RW;Minimal assistance           Functional mobility during ADLs: Min guard;Minimal assistance;Rolling walker;Cueing for safety       Vision Baseline Vision/History: Wears glasses;Macular Degeneration Wears Glasses: At all times Patient Visual Report: No change from baseline       Perception     Praxis      Pertinent Vitals/Pain Pain Assessment: No/denies pain     Hand Dominance Right   Extremity/Trunk Assessment Upper Extremity Assessment Upper Extremity Assessment: Overall WFL for tasks assessed   Lower Extremity Assessment Lower Extremity Assessment: Defer to PT evaluation   Cervical / Trunk Assessment Cervical / Trunk Assessment: Kyphotic   Communication Communication Communication: No difficulties   Cognition Arousal/Alertness: Awake/alert Behavior During Therapy: WFL for tasks assessed/performed Overall Cognitive Status: History of cognitive impairments - at baseline                       Memory: Decreased short-term memory       Problem Solving: Slow processing;Requires verbal cues;Requires tactile cues     General Comments       Exercises     Shoulder Instructions      Home Living Family/patient expects to be discharged to:: Private  residence Living Arrangements: Alone Available Help at Discharge: Family;Available PRN/intermittently;Personal care attendant Type of Home: Independent living facility Home Access: Level entry     Home Layout: One level     Bathroom Shower/Tub: Occupational psychologist: Handicapped height     Home Equipment: Walker - 4 wheels;Grab bars - toilet;Grab bars - tub/shower;Shower seat   Additional Comments: electric cart ( scooter), sleeps in lift chair        Prior Functioning/Environment Level of Independence: Independent with assistive device(s)  Gait / Transfers Assistance Needed: ambulating with Rollator ADL's / Homemaking Assistance Needed: assist for LB ADL at times, gets 3 meals a week provided by ILF            OT Problem List: Decreased strength;Decreased activity tolerance;Impaired balance (sitting and/or standing);Decreased cognition      OT Treatment/Interventions: Self-care/ADL training;DME and/or AE instruction;Patient/family education;Balance training    OT Goals(Current goals can be found in the care plan section) Acute Rehab OT Goals Patient Stated Goal: Return to [facility] at d/c OT Goal Formulation: With patient Time For Goal Achievement: 05/17/19 Potential to Achieve Goals: Good ADL Goals Pt Will Perform Grooming: with modified independence;standing(3 activities) Pt Will Perform Lower Body Bathing: with modified independence;with adaptive equipment;sit to/from stand Pt Will Perform Lower Body Dressing: with modified independence;sit to/from stand;with adaptive equipment Pt Will Transfer to Toilet: with modified independence;ambulating Pt Will Perform Toileting - Clothing Manipulation and hygiene: with modified independence;sit to/from stand  OT Frequency: Min 2X/week   Barriers to D/C:            Co-evaluation              AM-PAC OT "6 Clicks" Daily Activity     Outcome Measure Help from another person eating meals?: None Help from another person taking care of personal grooming?: A Little Help from another person toileting, which includes using toliet, bedpan, or urinal?: A Little Help from another person bathing (including washing, rinsing, drying)?: A Little Help from another person to put on and taking off regular upper body clothing?: None Help from another person to put on and taking off regular lower body clothing?: A Little 6 Click Score: 20   End of Session Equipment Utilized  During Treatment: Rolling walker;Gait belt;Oxygen  Activity Tolerance: Patient tolerated treatment well Patient left: in bed;with call bell/phone within reach;with bed alarm set;with family/visitor present  OT Visit Diagnosis: Other abnormalities of gait and mobility (R26.89);Unsteadiness on feet (R26.81)                Time: 6203-5597 OT Time Calculation (min): 21 min Charges:  OT General Charges $OT Visit: 1 Visit OT Evaluation $OT Eval Moderate Complexity: 1 Mod  Nestor Lewandowsky, OTR/L Acute Rehabilitation Services Pager: [phone] Office: [phone]  [place] So 05/03/2019, 11:50 AM

## 2019-05-03 NOTE — Consult Note (Signed)
Consultation Note Date: 05/03/2019   Patient Name: Ryan [name]  DOB: 12/27/1931  MRN: [id number]  Age / Sex: 86 y.o., male   PCP: Roetta Sessions, NP Referring Physician: Mercy Riding, MD   REASON FOR CONSULTATION:Establishing goals of care  Palliative Care consult requested for goals of care discussion in this 86 y.o. male with multiple medical problems including interstitial lung disease (2-3L home oxygen), diastolic CHF, non-Hodgkin's lymphoma pelvic mass status post radiation, diabetes mellitus type 2, chronic kidney disease stage IV, BPH, GERD, and anemia. Patient presented to ED from AL with complaints of nausea, decreased appetite, decreased urination, and bilateral leg swelling. During ED work-up Cr 5.2, sodium 124, potassium 5.0, BUN 122. Chest x-ray showed no acute changes. Since admission he is being followed by Nephrology and deemed not a candidate for dialysis. Renal function significantly improved Cr 1.0 and BUN 21 (05/03/2019).   Clinical Assessment and Goals of Care: I have reviewed medical records including lab results, imaging, Epic notes, and MAR, received report from the bedside RN, and assessed the patient. I met at the bedside with patient and his wife, Ryan [name] [facility]) to discuss diagnosis prognosis, [place], EOL wishes, disposition and options.  I introduced Palliative Medicine as specialized medical care for people living with serious illness. It focuses on providing relief from the symptoms and stress of a serious illness. The goal is to improve quality of life for both the patient and the family. Patient and wife verbalized understanding and appreciation of support.   Mr. Sitar is lying in bed playing on his phone. He is A&O x3. Complains of some back pain which he states is chronic and currently manageable. Denies shortness of breath.   We discussed a brief life review of the patient, along with his functional and nutritional status. Patient and wife  have been married for more than 45 years however, they are separated. Patient resides at [place] and wife resides in the home. They have 2 daughters who are flying in from [place] this coming Sunday.   Prior to admission patient was able to perform most ADLs independently with some assistance. He reports having issues with chronic back pain. He uses chronic home oxygen as needed. He is ambulatory with 4 wheel seated walker. Wife reports in the past patient was receiving care through [facility] who assisted with morning and night medications and other ADLs. He sleeps in a lift recliner most of the nights.   We discussed His current illness and what it means in the larger context of His on-going co-morbidities. Natural disease trajectory and expectations at EOL were discussed.  Patient and wife are both able to verbalize a clear understanding of patient's current illness and co-morbidities. Patient reports he feels much better today compared to other days. He and wife expressed several days ago they felt as if patient was closer to end-of-life and was anticipating the need for hospice support however, with recent updates from medical team and awareness of patient's improvement they are not certain they are ready for hospice at this point. Therapeutic listening and support given.   Wife shares she is thankful now that she is fully vaccinated and ALF is allowing her to visit with patient. She can now spend more time with him and monitor how he is doing.   I attempted to elicit values and goals of care important to the patient.    The difference between aggressive medical intervention and  comfort care was considered in light of the patient's goals of care. Mr. Molzahn expresses he remains hopeful for continued improvement however, if his health further declines his goal is not to return to the hospital but to be kept at home amongst family and friends for his last moments. Wife verbalized  agreement expressing they are not interested in multiple hospitalizations any further. Patient reports he wishes for pain control and hopeful elimination of any suffering if he was to fall deathly ill again. Support given.   Advanced directives, concepts specific to code status, artifical feeding and hydration, and rehospitalization were considered and discussed. Patient does not  have a documented advanced directive. He states his wishes are for DNR/DNI. He would not wish to undergo aggressive medical interventions such as dialysis or artificial feedings for a long period of time. He reports he may would consider if he had a condition that was more acute and would need temporary nutrition. Support given. Mr. Boulden expressed his wife Ryan [name] is his designated Media planner.   Discussed with patient and wife about MOST form to identify his wishes. Education provided on MOST. Patient and wife completed forms with selections for DNR, Comfort measures, antibiotics as indicated for limited period, IVF defined period, and feeding tube for defined trial. I did discuss with patient and wife that some of his choices as indicated did not align with comfort care, however patient expressed concerns with more acute issues and felt he would rather address in the event he was faced with actual decisions.   Patient is a current patient of outpatient Palliative with [facility]. Hospice and Palliative Care services outpatient were explained and offered. Patient and family verbalized their understanding and awareness of both palliative and hospice's goals and philosophy of care. At this time they are requesting to continue support of outpatient palliative with a goal of transitioning to hospice in the future once health declines again.   Questions and concerns were addressed.  Hard Choices booklet left for review. The family was encouraged to call with questions or concerns.  PMT will continue to support  holistically.   SOCIAL HISTORY:     reports that he quit smoking about 27 years ago. His smoking use included pipe and cigarettes. He has a 35.00 pack-year smoking history. He has never used smokeless tobacco. He reports current alcohol use. He reports that he does not use drugs.  CODE STATUS: DNR  ADVANCE DIRECTIVES: [name] (wife/HCPOA)   SYMPTOM MANAGEMENT: per attending   Palliative Prophylaxis:   Aspiration, Bowel Regimen and Frequent Pain Assessment  PSYCHO-SOCIAL/SPIRITUAL:  Support System: Family  Desire for further Chaplaincy support: NO   Additional Recommendations (Limitations, Scope, Preferences):  Continue to treat, no aggressive interventions, no HD, DNR   PAST MEDICAL HISTORY: Past Medical History:  Diagnosis Date  . Asthma   . Barrett's esophagus   . Benign prostatic hypertrophy   . Bilateral lower extremity edema   . CHF (congestive heart failure) ([place])   . Chronic fatigue   . COPD (chronic obstructive pulmonary disease) ([place])   . Diabetes mellitus type 2 in nonobese ([place]) 04/24/2019  . Diverticulosis of colon (without mention of hemorrhage)   . Esophageal reflux   . Gastritis   . Gluten intolerance   . Hiatal hernia 02/10/11   Noted on CT Scan - Moderate Hiatal Hernia  . Hyperlipemia   . Hypertension   . Lymphoma of lymph nodes in pelvis ([place]) 03/03/2011    Large Right Retroperitoneal Mass  .  Melanoma ([place])    lymphoma  . Microcytic anemia 04/20/2011   . 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 Osteoarthritis    hands/feet,knees, NECK, BACK  . Periaortic lymphadenopathy 02/16/2011  . Personal history of colonic polyps 2004   hyperplastic Dr. Collene Mares  . Pulmonary hyperinflation   . Renal cyst 02/10/11    Noted on CT Scan - Bilateral Renal Cysts  . S/P radiation therapy 03/15/11 - 04/09/11   Abdominal/ Pelvic Tumor, 3600 cGy/20 Fractions    PAST SURGICAL HISTORY:  Past Surgical History:   Procedure Laterality Date  . ARTHROSCOPIC REPAIR ACL     right  . BONE MARROW ASPIRATION  02/25/11   Bone Marrow, Aspirate, Clot, and Bilateral Bx, Right PIC  . CATARACT EXTRACTION, BILATERAL    . EYE SURGERY    . HERNIA REPAIR     LEFT INGUINAL   . INSERTION OF MESH N/A 08/23/2012   Procedure: INSERTION OF MESH;  Surgeon: Madilyn Hook, DO;  Location: [facility];  Service: General;  Laterality: N/A;  . LOOP RECORDER INSERTION N/A 04/26/2019   Procedure: LOOP RECORDER INSERTION;  Surgeon: Evans Lance, MD;  Location: [place] [facility];  Service: Cardiovascular;  Laterality: N/A;  . LUMBAR LAMINECTOMY/DECOMPRESSION MICRODISCECTOMY Right 12/31/2013   Procedure: RIGHT L4-5 L5-S1 LAMINECTOMY;  Surgeon: Kristeen Miss, MD;  Location: [place] [facility];  Service: Neurosurgery;  Laterality: Right;  RIGHT L4-5 L5-S1 LAMINECTOMY  . RIGHT HEART CATH N/A 07/25/2018   Procedure: RIGHT HEART CATH;  Surgeon: Larey Dresser, MD;  Location: [place] [facility];  Service: Cardiovascular;  Laterality: N/A;  . ROTATOR CUFF REPAIR     right  . TONSILLECTOMY    . TONSILLECTOMY    . VENTRAL HERNIA REPAIR N/A 08/23/2012   Procedure: HERNIA REPAIR VENTRAL ADULT;  Surgeon: Madilyn Hook, DO;  Location: [facility];  Service: General;  Laterality: N/A;    ALLERGIES:  is allergic to pollen extract-tree extract and molds & smuts.   MEDICATIONS:  Current Facility-Administered Medications  Medication Dose Route Frequency Provider Last Rate Last Admin  . acetaminophen (TYLENOL) tablet 650 mg  650 mg Oral Q6H PRN Wynetta Fines T, MD   650 mg at 04/30/19 2356   Or  . acetaminophen (TYLENOL) suppository 650 mg  650 mg Rectal Q6H PRN Wynetta Fines T, MD      . allopurinol (ZYLOPRIM) tablet 50 mg  50 mg Oral Daily Wynetta Fines T, MD   50 mg at 05/03/19 0943  . aspirin tablet 325 mg  325 mg Oral Daily Wynetta Fines T, MD   325 mg at 05/03/19 0943  . atorvastatin (LIPITOR) tablet 40 mg  40 mg Oral q1800 Wynetta Fines T, MD   40 mg at 05/02/19  1641  . calcium carbonate (OS-CAL - dosed in mg of elemental calcium) tablet 1,000 mg of elemental calcium  2 tablet Oral TID WC Edrick Oh, MD   1,000 mg of elemental calcium at 05/03/19 1318  . diltiazem (CARDIZEM) tablet 30 mg  30 mg Oral Q8H Gonfa, Taye T, MD      . escitalopram (LEXAPRO) tablet 10 mg  10 mg Oral Daily Wynetta Fines T, MD   10 mg at 05/03/19 0942  . finasteride (PROSCAR) tablet 5 mg  5 mg Oral QHS Wynetta Fines T, MD   5 mg at 05/02/19 2125  . heparin injection 5,000 Units  5,000 Units Subcutaneous Q12H Lequita Halt, MD  5,000 Units at 05/03/19 0944  . HYDROmorphone (DILAUDID) injection 0.5 mg  0.5 mg Intravenous Q4H PRN Wynetta Fines T, MD   0.5 mg at 05/03/19 0902  . ipratropium (ATROVENT) 0.06 % nasal spray 2 spray  2 spray Each Nare BID Lequita Halt, MD   2 spray at 05/03/19 [phone]  . latanoprost (XALATAN) 0.005 % ophthalmic solution 1 drop  1 drop Both Eyes QHS Lequita Halt, MD   1 drop at 05/02/19 2125  . ondansetron (ZOFRAN) injection 4 mg  4 mg Intravenous Q6H PRN Wynetta Fines T, MD   4 mg at 05/01/19 1059  . pantoprazole (PROTONIX) EC tablet 40 mg  40 mg Oral Daily Wynetta Fines T, MD   40 mg at 05/03/19 0944  . polyethylene glycol (MIRALAX / GLYCOLAX) packet 17 g  17 g Oral Daily PRN Wendee Beavers T, MD   17 g at 05/02/19 1023  . Riociguat ([place]) TABs 1.35m (home medication)  1.5 mg Oral TID GWendee BeaversT, MD   1.5 mg at 05/03/19 1142  . rOPINIRole (REQUIP) tablet 2 mg  2 mg Oral TID ZLequita Halt MD   2 mg at 05/03/19 [phone] . Selexipag TABS 1,600 mcg  1,600 mcg Oral BID ZWynetta FinesT, MD   1,600 mcg at 05/03/19 [phone] . traZODone (DESYREL) tablet 100 mg  100 mg Oral QHS ZWynetta FinesT, MD   100 mg at 05/02/19 2125    VITAL SIGNS: BP 94/65 (BP Location: Left Arm)   Pulse 92   Temp 98.6 F (37 C) (Oral)   Resp 18   Ht 5' 7" (1.702 m)   Wt 77.1 kg   SpO2 92%   BMI 26.63 kg/m  Filed Weights   04/30/19 1245 05/03/19 0517  Weight: 84.8 kg 77.1 kg    Estimated  body mass index is 26.63 kg/m as calculated from the following:   Height as of this encounter: 5' 7" (1.702 m).   Weight as of this encounter: 77.1 kg.  LABS: CBC:    Component Value Date/Time   WBC 5.7 05/03/2019 0233   HGB 10.8 (L) 05/03/2019 0233   HGB 14.2 06/26/2015 0916   HCT 35.0 (L) 05/03/2019 0233   HCT 42.9 06/26/2015 0916   PLT 253 05/03/2019 0233   PLT 237 06/26/2015 0916   Comprehensive Metabolic Panel:    Component Value Date/Time   NA 141 05/03/2019 0806   NA 144 03/16/2017 1137   NA 143 06/26/2015 0916   K 4.6 05/03/2019 0806   K 3.8 06/26/2015 0916   CO2 25 05/03/2019 0806   CO2 32 (H) 06/26/2015 0916   BUN 21 05/03/2019 0806   BUN 19 03/16/2017 1137   BUN 14.0 06/26/2015 0916   CREATININE 1.00 05/03/2019 0806   CREATININE 1.0 06/26/2015 0916   ALBUMIN 2.8 (L) 05/03/2019 0233   ALBUMIN 3.5 06/26/2015 0916     Review of Systems  Cardiovascular: Positive for leg swelling.  Musculoskeletal: Positive for back pain.  Neurological: Positive for weakness.  Unless otherwise noted, a complete review of systems is negative.  Physical Exam General: NAD Cardiovascular: regular rate and rhythm Pulmonary: clear ant fields Abdomen: soft, nontender, + bowel sounds Extremities: no edema, no joint deformities Skin: no rashes Neurological: A&O x3, mood appropriate   Prognosis: Unable to determine  Discharge Planning:  Returned to ALF with home health and outpatient palliative. Currently a patient of [facility]  Recommendations:  DNR/DNI-as confirmed by patient/wife  MOST form completed as requested by patient with his selections for: DNR, Comfort, determine use of abx, IV fluids/artifical feeding for defined trial with patient expressing a more acute situation for these interventions, however goal is not to return to hospital unless unable to be cared for in the home. Form placed on chart to be sent home with patient.   Patient and family requesting to  continue with outpatient palliative with a goal to transition to hospice once patient declines again. Patient not interested in rehospitalization. He is a current patient of [facility] Palliative services. Colletta [place], CM TOC notified)   PMT will continue to support and follow as needed.    Palliative Performance Scale: PPS 30-40%                Patient and wife, Ryan [name] expressed understanding and was in agreement with this plan.   Thank you for allowing the Palliative Medicine Team to assist in the care of this patient.  Time In: 1310 Time Out: 1415 Time Total: 65 min.   Visit consisted of counseling and education dealing with the complex and emotionally intense issues of symptom management and palliative care in the setting of serious and potentially life-threatening illness.Greater than 50%  of this time was spent counseling and coordinating care related to the above assessment and plan.  Signed by:  Alda Lea, AGPCNP-BC Palliative Medicine Team  Phone: [phone] Fax: [phone] Pager: [phone] Amion: Bjorn Pippin

## 2019-05-03 NOTE — TOC Transition Note (Addendum)
Transition of Care [facility]) - CM/SW Discharge Note   Patient Details  Name: KEITA VALLEY MRN: [id number] Date of Birth: Jun 05, 1931  Transition of Care [facility]) CM/SW Contact:  Zenon Mayo, RN Phone Number: 05/03/2019, 3:36 PM   Clinical Narrative:    Patient for discharge, will need HHPT/HHOT, NCM spoke with [facility] and they state that [facility] will do hhpt/hhot for patient.  [facility] stated to  Fax orders and progress note and face sheet to [phone].    Final next level of care: Home w Home Health Services Barriers to Discharge: No Barriers Identified   Patient Goals and CMS Choice   CMS Medicare.gov Compare Post Acute Care list provided to:: Patient Represenative (must comment)    Discharge Placement                       Discharge Plan and Services In-house Referral: Hospice / Palliative Care Discharge Planning Services: CM Consult Post Acute Care Choice: Hospice          DME Arranged: N/A DME Agency: NA       HH Arranged: PT, OT HH Agency: Secondary school teacher) Date [place]: 05/03/19 Time [place]: 1536 Representative spoke with at [place]: [facility] rep  Social Determinants of Health ([place]) Interventions     Readmission Risk Interventions Readmission Risk Prevention Plan 09/26/2018 09/26/2018 09/12/2018  Transportation Screening Complete - Complete  PCP or Specialist Appt within 3-5 Days - - Complete  HRI or [place] - - Complete  Social Work Consult for [place] Planning/Counseling - - Complete  Palliative Care Screening - - Not Applicable  Medication Review Press photographer) Complete - Complete  PCP or Specialist appointment within 3-5 days of discharge Complete - -  [place] or Home Care Consult Complete - -  SW Recovery Care/Counseling Consult Complete - -  Palliative Care Screening Complete Complete -  Skilled Nursing Facility Complete Complete -  Some recent data might be hidden

## 2019-05-03 NOTE — Progress Notes (Signed)
PROGRESS NOTE  Ryan [name] JJK:[id number] DOB: 1931/03/04   PCP: Roetta Sessions, NP  Patient is from: Independent living facility  DOA: 04/30/2019 LOS: 3  Brief Narrative / Interim history: 86 year old male with history of ILD on home oxygen, diastolic CHF, NHL pelvic mass s/p radiation therapy, DM-2, CKD-4, anemia of renal disease and recent hospitalization for CVA presenting with bilateral LE swelling, generalized weakness, nausea and poor p.o. intake.  He was admitted for acute on chronic renal failure with significant azotemia. Cr 5.3 (baseline 1.9) and BUN of 116.  Started on IV fluid.  Nephrology consulted.  AKI and uremia resolved.  Creatinine down to 1.0.  BUN down to 28.  Subjective: Seen and examined earlier this morning.  No major events overnight of this morning.  Complaining chronic back pain.  Denies chest pain, dyspnea, GI or UTI symptoms.  Denies focal neuro deficits, bowel or bladder issues.  Objective: Vitals:   05/02/19 1718 05/02/19 2048 05/03/19 0517 05/03/19 0934  BP: (!) 109/59 111/79 111/73 94/65  Pulse: 77 91 87 92  Resp: 18   18  Temp: 98.2 F (36.8 C) 98 F (36.7 C) 98.1 F (36.7 C) 98.6 F (37 C)  TempSrc: Oral Oral Oral Oral  SpO2: 97% 98% 98% 92%  Weight:   77.1 kg   Height:        Intake/Output Summary (Last 24 hours) at 05/03/2019 1424 Last data filed at 05/03/2019 0900 Gross per 24 hour  Intake 2992.08 ml  Output 2100 ml  Net 892.08 ml   Filed Weights   04/30/19 1245 05/03/19 0517  Weight: 84.8 kg 77.1 kg    Examination:  GENERAL: No acute distress.  Sitting on the edge of the bed. HEENT: MMM.  Vision and hearing grossly intact.  NECK: Supple.  No apparent JVD.  RESP: 98% on 2 L.  No IWOB.  Fair aeration bilaterally. CVS:  RRR. Heart sounds normal.  ABD/GI/GU: Bowel sounds present. Soft. Non tender.  MSK/EXT:  Moves extremities. No apparent deformity. No edema.  SKIN: no apparent skin lesion or wound NEURO: Awake,  alert and oriented appropriately.  No apparent focal neuro deficit other than some stuttering PSYCH: Calm. Normal affect.  Procedures:  None  Assessment & Plan: AKI on CKD-4 with azotemia: Concern for ATN in the setting of diuretics and hypotension.  Renal US without hydronephrosis. Baseline Cr ~2.0> 5.27> 4.0>1.0. BUN 122>113>28.  He had about 3.6 L UOP/24 hours.  -Discontinue IV fluid -Avoid nephrotoxic meds -Recheck BMP in the morning  History of CVA: Hospitalized 3/23-3/26 for acute punctate infarct in the right occipital area.  Echo with severe LVH.  CUS with 40 to 59% right ICA stenosis.  Loop recorder revealed A. fib.  On Cardizem, aspirin and statin.  Neuro exam within normal except for stuttering and twitching.  Per patient, he was told by neurology, Dr. Erlinda Hong That he is a stuttering and twitching R responses to his back pain. -Continue home aspirin and statin  Paroxysmal A. fib: Rate controlled without medication.  On Cardizem and full dose aspirin at home. -Continue home aspirin -Start Cardizem 30 mg 4 times daily  Hypovolemic hyponatremia: Likely due to renal failure.  Resolved.  Hypocalcemia-resolved. -Continue Tums 2 tablets 3 times a day  History of pulmonary hypertension: Recent echo on 3/24 with normal pulmonary hypertension. -Continue home Adempas and selexipag  Chronic COPD/ILD/chronic RF on 2 L at baseline: Stable. -As needed breathing treatments  Acute on chronic respiratory failure with hypoxia:  Multifactorial including COPD, ILD and pulmonary hypertension.  Improved.  He is back on 2 L by nasal cannula. -Encourage incentive spirometry -Treat COPD and pulmonary hypertension as above  Normocytic anemia/anemia of renal disease: H&H stable.  Anemia panel normal. -Recheck CBC outpatient.  BPH: No UTI symptoms. -Continue home Proscar -Monitor urine output  Debility -PT/OT-recommended home health PT.  DNR/DNI/goal of care-enrolled with hospice previously.   Patient's wife would like to discuss goal of care with palliative care.  -Palliative care consulted and will meet with patient's wife this afternoon.                 DVT prophylaxis: Subcu heparin Code Status: DNR/DNI Family Communication: Updated patient's wife over the phone.  Discharge barrier: Patient to be discharged to independent living facility.  Patient's wife requesting discharge tomorrow to have his room set up, and meet with palliative care to discuss goal of care Patient is from: Home Final disposition: Will discharge back to ALF on 4/2 with home health.  Consultants: Nephrology, palliative care   Microbiology summarized: MCNOB-09 negative Blood cultures negative  Sch Meds:  Scheduled Meds: . allopurinol  50 mg Oral Daily  . aspirin  325 mg Oral Daily  . atorvastatin  40 mg Oral q1800  . calcium carbonate  2 tablet Oral TID WC  . escitalopram  10 mg Oral Daily  . finasteride  5 mg Oral QHS  . heparin  5,000 Units Subcutaneous Q12H  . ipratropium  2 spray Each Nare BID  . latanoprost  1 drop Both Eyes QHS  . pantoprazole  40 mg Oral Daily  . Riociguat  1.5 mg Oral TID  . rOPINIRole  2 mg Oral TID  . Selexipag  1,600 mcg Oral BID  . traZODone  100 mg Oral QHS   Continuous Infusions:  PRN Meds:.acetaminophen **OR** acetaminophen, HYDROmorphone (DILAUDID) injection, ondansetron (ZOFRAN) IV, polyethylene glycol  Antimicrobials: Anti-infectives (From admission, onward)   None       I have personally reviewed the following labs and images: CBC: Recent Labs  Lab 04/30/19 1257 05/01/19 0338 05/03/19 0233  WBC 11.3* 8.2 5.7  NEUTROABS 9.8*  --   --   HGB 12.5* 10.7* 10.8*  HCT 38.4* 32.6* 35.0*  MCV 88.7 87.6 93.3  PLT 304 265 253   BMP &GFR Recent Labs  Lab 04/27/19 1140 04/30/19 1257 05/01/19 0338 05/03/19 0233 05/03/19 0806  NA 136 125* 129* 141 141  K 3.9 5.0 4.0 4.3 4.6  CL 90* 89* 89* 105 106  CO2 33* _0 GLUCOSE 117*  116* 75 105* 92  BUN 63* 122* 113* 26* 21  CREATININE 1.98* 5.27* 4.00* 1.00 1.00  CALCIUM 8.7* 7.9* 7.4* 8.1* 8.7*  MG  --   --   --  2.1  --   PHOS  --   --   --  2.2*  --    Estimated Creatinine Clearance: 50.5 mL/min (by C-G formula based on SCr of 1 mg/dL). Liver & Pancreas: Recent Labs  Lab 04/30/19 1257 05/03/19 0233  AST 16  --   ALT 21  --   ALKPHOS 99  --   BILITOT 1.3*  --   PROT 7.5  --   ALBUMIN 4.2 2.8*   Recent Labs  Lab 04/30/19 1257  LIPASE 27   No results for input(s): AMMONIA in the last 168 hours. Diabetic: No results for input(s): HGBA1C in the last 72 hours. Recent Labs  Lab 04/26/19  1552 04/26/19 2116 04/27/19 0640 04/27/19 1231 05/02/19 1420  GLUCAP 123* 202* 90 120* 84   Cardiac Enzymes: No results for input(s): CKTOTAL, CKMB, CKMBINDEX, TROPONINI in the last 168 hours. No results for input(s): PROBNP in the last 8760 hours. Coagulation Profile: No results for input(s): INR, PROTIME in the last 168 hours. Thyroid Function Tests: No results for input(s): TSH, T4TOTAL, FREET4, T3FREE, THYROIDAB in the last 72 hours. Lipid Profile: No results for input(s): CHOL, HDL, LDLCALC, TRIG, CHOLHDL, LDLDIRECT in the last 72 hours. Anemia Panel: Recent Labs    05/03/19 0806  VITAMINB12 192  FOLATE 9.1  FERRITIN 70  TIBC 344  IRON 72  RETICCTPCT 1.7   Urine analysis:    Component Value Date/Time   COLORURINE YELLOW 04/30/2019 2025   APPEARANCEUR CLEAR 04/30/2019 2025   LABSPEC 1.011 04/30/2019 2025   LABSPEC 1.025 06/28/2013 1321   PHURINE 5.0 04/30/2019 2025   GLUCOSEU NEGATIVE 04/30/2019 2025   GLUCOSEU Negative 06/28/2013 1321   HGBUR NEGATIVE 04/30/2019 2025   BILIRUBINUR NEGATIVE 04/30/2019 2025   BILIRUBINUR Negative 06/28/2013 1321   KETONESUR NEGATIVE 04/30/2019 2025   PROTEINUR NEGATIVE 04/30/2019 2025   UROBILINOGEN 1.0 01/04/2014 2156   UROBILINOGEN 0.2 06/28/2013 1321   NITRITE NEGATIVE 04/30/2019 2025   LEUKOCYTESUR  NEGATIVE 04/30/2019 2025   LEUKOCYTESUR Negative 06/28/2013 1321   Sepsis Labs: Invalid input(s): PROCALCITONIN, [place]  Microbiology: Recent Results (from the past 240 hour(s))  SARS CORONAVIRUS 2 (TAT 6-24 HRS) Nasopharyngeal Nasopharyngeal Swab     Status: None   Collection Time: 04/24/19 10:54 PM   Specimen: Nasopharyngeal Swab  Result Value Ref Range Status   SARS Coronavirus 2 NEGATIVE NEGATIVE Final    Comment: (NOTE) SARS-CoV-2 target nucleic acids are NOT DETECTED. The SARS-CoV-2 RNA is generally detectable in upper and lower respiratory specimens during the acute phase of infection. Negative results do not preclude SARS-CoV-2 infection, do not rule out co-infections with other pathogens, and should not be used as the sole basis for treatment or other patient management decisions. Negative results must be combined with clinical observations, patient history, and epidemiological information. The expected result is Negative. Fact Sheet for Patients: SugarRoll.be Fact Sheet for Healthcare Providers: https://www.woods-mathews.com/ This test is not yet approved or cleared by the [place] FDA and  has been authorized for detection and/or diagnosis of SARS-CoV-2 by FDA under an Emergency Use Authorization (EUA). This EUA will remain  in effect (meaning this test can be used) for the duration of the COVID-19 declaration under Section 56 4(b)(1) of the Act, 21 U.S.C. section 360bbb-3(b)(1), unless the authorization is terminated or revoked sooner. Performed at [facility], [place] [address] [place], [place] [address]   MRSA PCR Screening     Status: None   Collection Time: 04/25/19  2:04 AM   Specimen: Nasal Mucosa; Nasopharyngeal  Result Value Ref Range Status   MRSA by PCR NEGATIVE NEGATIVE Final    Comment:        The GeneXpert MRSA Assay (FDA approved for NASAL specimens only), is one component of a comprehensive  MRSA colonization surveillance program. It is not intended to diagnose MRSA infection nor to guide or monitor treatment for MRSA infections. Performed at [facility], [place] [address] [place], [place] [address]   Blood culture (routine x 2)     Status: None (Preliminary result)   Collection Time: 04/30/19  2:25 PM   Specimen: BLOOD RIGHT HAND  Result Value Ref Range Status  Specimen Description BLOOD RIGHT HAND  Final   Special Requests   Final    BOTTLES DRAWN AEROBIC AND ANAEROBIC Blood Culture adequate volume   Culture   Final    NO GROWTH 3 DAYS Performed at [facility], [address] [place], [place] [address]    Report Status PENDING  Incomplete  Blood culture (routine x 2)     Status: None (Preliminary result)   Collection Time: 04/30/19  2:40 PM   Specimen: BLOOD LEFT HAND  Result Value Ref Range Status   Specimen Description BLOOD LEFT HAND  Final   Special Requests   Final    BOTTLES DRAWN AEROBIC AND ANAEROBIC Blood Culture results may not be optimal due to an inadequate volume of blood received in culture bottles   Culture   Final    NO GROWTH 3 DAYS Performed at [facility], [place] [address], [place], [place] [address]    Report Status PENDING  Incomplete  SARS CORONAVIRUS 2 (TAT 6-24 HRS) Nasopharyngeal Nasopharyngeal Swab     Status: None   Collection Time: 04/30/19  8:25 PM   Specimen: Nasopharyngeal Swab  Result Value Ref Range Status   SARS Coronavirus 2 NEGATIVE NEGATIVE Final    Comment: (NOTE) SARS-CoV-2 target nucleic acids are NOT DETECTED. The SARS-CoV-2 RNA is generally detectable in upper and lower respiratory specimens during the acute phase of infection. Negative results do not preclude SARS-CoV-2 infection, do not rule out co-infections with other pathogens, and should not be used as the sole basis for treatment or other patient management decisions. Negative results must be combined with clinical observations, patient  history, and epidemiological information. The expected result is Negative. Fact Sheet for Patients: SugarRoll.be Fact Sheet for Healthcare Providers: https://www.woods-mathews.com/ This test is not yet approved or cleared by the [place] FDA and  has been authorized for detection and/or diagnosis of SARS-CoV-2 by FDA under an Emergency Use Authorization (EUA). This EUA will remain  in effect (meaning this test can be used) for the duration of the COVID-19 declaration under Section 56 4(b)(1) of the Act, 21 U.S.C. section 360bbb-3(b)(1), unless the authorization is terminated or revoked sooner. Performed at [facility], [place] [address], [place], [place] [address]     Radiology Studies: No results found.  Taye T. [place]  If 7PM-7AM, please contact night-coverage [email or web address] Password [facility] 05/03/2019, 2:24 PM

## 2019-05-03 NOTE — Plan of Care (Signed)
  Problem: Respiratory: Goal: Respiratory symptoms related to disease process will be avoided Outcome: Adequate for Discharge

## 2019-05-03 NOTE — Progress Notes (Signed)
Wife at bedside requesting palliative care to come speak to her. Paged palliative care., spoke to [facility], who has established contact with wife and will meet with her at 1300

## 2019-05-03 NOTE — Plan of Care (Signed)
  Problem: Urinary Elimination: Goal: Progression of disease will be identified and treated Outcome: Progressing   Problem: Clinical Measurements: Goal: Ability to maintain clinical measurements within normal limits will improve Outcome: Progressing

## 2019-05-03 NOTE — Progress Notes (Signed)
Palliative Note:  Consult received for goals of care discussion. Chart reviewed. I spoke with patient's wife, Yuvraj Pfeifer and introduced myself and Palliative's role inpatient.  Mrs. Byrom verbalized understanding and appreciation of our involvement.   As requested I will plan to meet her at patient's bedside today at 1pm. Detailed note and recommendations to follow.   Thank you for allowing the PMT to assist in Mr. Blagg care.   Alda Lea, AGPCNP-BC Palliative Medicine Team  Phone: [phone] Pager: [phone] Amion: Bjorn Pippin   No Charge

## 2019-05-04 DIAGNOSIS — I5032 Chronic diastolic (congestive) heart failure: Secondary | ICD-10-CM

## 2019-05-04 DIAGNOSIS — J449 Chronic obstructive pulmonary disease, unspecified: Secondary | ICD-10-CM

## 2019-05-04 LAB — BASIC METABOLIC PANEL
Anion gap: 6 (ref 5–15)
BUN: 14 mg/dL (ref 8–23)
CO2: 28 mmol/L (ref 22–32)
Calcium: 9.3 mg/dL (ref 8.9–10.3)
Chloride: 103 mmol/L (ref 98–111)
Creatinine, Ser: 1.02 mg/dL (ref 0.61–1.24)
GFR calc Af Amer: >60 mL/min (ref >60)
GFR calc non Af Amer: >60 mL/min (ref >60)
Glucose, Bld: 123 mg/dL — ABNORMAL HIGH (ref 70–99)
Potassium: 4.5 mmol/L (ref 3.5–5.1)
Sodium: 137 mmol/L (ref 135–145)

## 2019-05-04 LAB — GLUCOSE, CAPILLARY: Glucose-Capillary: 121 mg/dL — ABNORMAL HIGH (ref 70–99)

## 2019-05-04 LAB — CBC
HCT: 36.9 % — ABNORMAL LOW (ref 39.0–52.0)
Hemoglobin: 11.4 g/dL — ABNORMAL LOW (ref 13.0–17.0)
MCH: 28.6 pg (ref 26.0–34.0)
MCHC: 30.9 g/dL (ref 30.0–36.0)
MCV: 92.7 fL (ref 80.0–100.0)
Platelets: 250 10*3/uL (ref 150–400)
RBC: 3.98 MIL/uL — ABNORMAL LOW (ref 4.22–5.81)
RDW: 15.3 % (ref 11.5–15.5)
WBC: 6.5 10*3/uL (ref 4.0–10.5)
nRBC: 0 % (ref 0.0–0.2)

## 2019-05-04 MED ORDER — DILTIAZEM HCL ER COATED BEADS 120 MG PO CP24: 120.0000 mg | ORAL_CAPSULE | Freq: Every day | ORAL | 1 refills | Status: DC

## 2019-05-04 MED ORDER — TORSEMIDE 20 MG PO TABS: 60.0000 mg | ORAL_TABLET | Freq: Two times a day (BID) | ORAL | 0 refills | Status: DC

## 2019-05-04 NOTE — Progress Notes (Signed)
Occupational Therapy Treatment Patient Details Name: Ryan [name] MRN: [id number] DOB: Dec 16, 1931 Today's Date: 05/04/2019    History of present illness Pt is 86 y.o. male with medical history significant of interstitial lung disease on home oxygen, diastolic CHF prior non-Hodgkin's lymphoma pelvic mass status post radiation therapy, diabetes mellitus type 2, chronic kidney disease stage 4, and anemia. He hospitalized recently for stroke, and was discharged on March 26.  Pt now admitted with AKI and hyponatremia.   OT comments  Pt progressing with OT goals this session, received sitting EOB. Pt Max A to don socks sitting EOB, reporting unable to complete. Pt Supervision for sit to stand from bedside with RW, min guard for mobility to/from bathroom with RW. Assistance needed to steady pt in standing intermittently. Pt Min A for toileting task for clothing mgmt and posterior hygiene. Pt Supervision for grooming tasks in standing at sink and sitting EOB. Pt noted to have SOB during activity without O2 (pt reports this is normal for him). Provided cues for pursed lip breathing and implementation of other energy conservation techniques with pt verbalizing understanding. Recommend HHOT evaluation at DC. Will continue to follow acutely.    Follow Up Recommendations  Home health OT    Equipment Recommendations  None recommended by OT    Recommendations for Other Services      Precautions / Restrictions Precautions Precautions: Fall Restrictions Weight Bearing Restrictions: No       Mobility Bed Mobility Overal bed mobility: Needs Assistance             General bed mobility comments: Sitting EOB on entry and exit   Transfers Overall transfer level: Needs assistance Equipment used: Rolling walker (2 wheeled) Transfers: Sit to/from [facility] Sit to Stand: Supervision Stand pivot transfers: Min guard       General transfer comment: min guard for safety in  maintaining balance     Balance Overall balance assessment: Needs assistance Sitting-balance support: No upper extremity supported;Feet supported Sitting balance-Leahy Scale: Good     Standing balance support: Single extremity supported;During functional activity Standing balance-Leahy Scale: Fair                             ADL either performed or assessed with clinical judgement   ADL Overall ADL's : Needs assistance/impaired     Grooming: Supervision/safety;Wash/dry hands;Standing;Oral care;Sitting Grooming Details (indicate cue type and reason): Supervision for hand hygiene standing at sink with RW, setup for oral care sitting EOB             Lower Body Dressing: Moderate assistance;Sitting/lateral leans Lower Body Dressing Details (indicate cue type and reason): Max A for donning socks, Min A for mgmt of clothing at waist for toileting  Toilet Transfer: Min guard;Ambulation;Regular Glass blower/designer Details (indicate cue type and reason): min guard to ensure balance with minor LOB noted  Toileting- Clothing Manipulation and Hygiene: Minimal assistance;Sit to/from stand;Sitting/lateral lean Toileting - Clothing Manipulation Details (indicate cue type and reason): Min A for clothing mgmt and thorough posterior hygiene in standing      Functional mobility during ADLs: Min guard;Rolling walker General ADL Comments: Pt with improved mobility and RW mgmt. Pt exhibited SOB during activity, provided cues for pursed lip breathing and rest breaks      Vision       Perception     Praxis      Cognition Arousal/Alertness: Awake/alert Behavior During Therapy: [facility] for  tasks assessed/performed Overall Cognitive Status: History of cognitive impairments - at baseline                                          Exercises     Shoulder Instructions       General Comments Pt received on 2 L O2. reports able to take off for mobility to bathroom,  some SOB but recovered quickly with seated rest break and cues for slow breathing     Pertinent Vitals/ Pain       Pain Assessment: Faces Faces Pain Scale: Hurts a little bit Pain Location: generalized back pain Pain Descriptors / Indicators: Aching;Constant Pain Intervention(s): Monitored during session;Limited activity within patient's tolerance;Premedicated before session  Home Living                                          Prior Functioning/Environment              Frequency  Min 2X/week        Progress Toward Goals  OT Goals(current goals can now be found in the care plan section)  Progress towards OT goals: Progressing toward goals  Acute Rehab OT Goals Patient Stated Goal: Return to [facility] [employer] at d/c OT Goal Formulation: With patient Time For Goal Achievement: 05/17/19 Potential to Achieve Goals: Good ADL Goals Pt Will Perform Grooming: with modified independence;standing Pt Will Perform Lower Body Bathing: with modified independence;with adaptive equipment;sit to/from stand Pt Will Perform Lower Body Dressing: with modified independence;sit to/from stand;with adaptive equipment Pt Will Transfer to Toilet: with modified independence;ambulating Pt Will Perform Toileting - Clothing Manipulation and hygiene: with modified independence;sit to/from stand  Plan Discharge plan remains appropriate    Co-evaluation                 AM-PAC OT "6 Clicks" Daily Activity     Outcome Measure   Help from another person eating meals?: None Help from another person taking care of personal grooming?: A Little Help from another person toileting, which includes using toliet, bedpan, or urinal?: A Little Help from another person bathing (including washing, rinsing, drying)?: A Little Help from another person to put on and taking off regular upper body clothing?: None Help from another person to put on and taking off regular lower body clothing?: A  Little 6 Click Score: 20    End of Session Equipment Utilized During Treatment: Rolling walker;Oxygen  OT Visit Diagnosis: Other abnormalities of gait and mobility (R26.89);Unsteadiness on feet (R26.81)   Activity Tolerance Patient tolerated treatment well   Patient Left in bed;with call bell/phone within reach;with bed alarm set;Other (comment)(with nutrition in room )   Nurse Communication Mobility status        Time: 8466-5993 OT Time Calculation (min): 49 min  Charges: OT General Charges $OT Visit: 1 Visit OT Treatments $Self Care/Home Management : 23-37 mins $Therapeutic Activity: 8-22 mins  Layla Maw, OTR/L   Layla Maw 05/04/2019, 10:04 AM

## 2019-05-04 NOTE — Progress Notes (Signed)
[facility] ([facility])  Noted plan to d/c to [facility] with HH.  Will follow up with our palliative services once he is at facility.   Venia Carbon RN, BSN, [facility] Liaison

## 2019-05-04 NOTE — TOC Transition Note (Signed)
Transition of Care [facility]) - CM/SW Discharge Note   Patient Details  Name: Ryan [name] MRN: [id number] Date of Birth: October 25, 1931  Transition of Care [facility]) CM/SW Contact:  Bartholomew Crews, RN Phone Number: [phone] 05/04/2019, 10:58 AM   Clinical Narrative:    Spoke with patient's spouse on the phone. Spouse to provide transportation home. [facility] to follow up for palliative services. Spoke with Rollene Fare at [facility] orders and Face to Face faxed. No further TOC needs identified.    Final next level of care: Home w Home Health Services Barriers to Discharge: No Barriers Identified   Patient Goals and CMS Choice   CMS Medicare.gov Compare Post Acute Care list provided to:: Patient Represenative (must comment)(spouse, [name]) Choice offered to / list presented to : Spouse  Discharge Placement                  Name of family member notified: [name] Patient and family notified of of transfer: 05/04/19  Discharge Plan and Services In-house Referral: Hospice / Palliative Care Discharge Planning Services: CM Consult Post Acute Care Choice: Hospice          DME Arranged: N/A DME Agency: NA       HH Arranged: PT, OT [place] Agency: Other - See comment([facility]) Date HH Agency Contacted: 05/04/19 Time [employer]: 0165 Representative spoke with at [place]: [place] ([place]) Interventions     Readmission Risk Interventions Readmission Risk Prevention Plan 09/26/2018 09/26/2018 09/12/2018  Transportation Screening Complete - Complete  PCP or Specialist Appt within 3-5 Days - - Complete  HRI or [place] - - Complete  Social Work Consult for [place] Planning/Counseling - - Complete  Palliative Care Screening - - Not Applicable  Medication Review Press photographer) Complete - Complete  PCP or Specialist appointment within 3-5 days of discharge Complete - -  [place] or Home Care Consult Complete - -  SW Recovery  Care/Counseling Consult Complete - -  Palliative Care Screening Complete Complete -  Skilled Nursing Facility Complete Complete -  Some recent data might be hidden

## 2019-05-04 NOTE — Progress Notes (Signed)
Physical Therapy Treatment Patient Details Name: Ryan [name] MRN: [id number] DOB: [birth year] Today's Date: 05/04/2019    History of Present Illness Pt is 86 y.o. male with medical history significant of interstitial lung disease on home oxygen, diastolic CHF prior non-Hodgkin's lymphoma pelvic mass status post radiation therapy, diabetes mellitus type 2, chronic kidney disease stage 4, and anemia. He hospitalized recently for stroke, and was discharged on March 26.  Pt now admitted with AKI and hyponatremia.    PT Comments    Pt with improved gait distance this session, but requires x1 standing rest break to recover fatigue and dyspnea on exertion. Pt also requires frequent verbal cuing for safety during gait with AD. HHPT remains appropriate d/c option, with intermittent assist from staff of [facility] as pt had prior to arrival to [facility] Plan is to d/c home today.    Follow Up Recommendations  Home health PT;Supervision for mobility/OOB(Return to [facility])     Equipment Recommendations  None recommended by PT    Recommendations for Other Services       Precautions / Restrictions Precautions Precautions: Fall Restrictions Weight Bearing Restrictions: No    Mobility  Bed Mobility Overal bed mobility: Needs Assistance Bed Mobility: Rolling;Sidelying to Sit;Sit to Supine Rolling: Supervision Sidelying to sit: Min assist   Sit to supine: Supervision   General bed mobility comments: supervision for rolling and return to supine for safety, increased time with use of bedrails. Min assist for sidelying to sit for trunk elevation with use of HHA.  Transfers Overall transfer level: Needs assistance Equipment used: Rolling walker (2 wheeled) Transfers: Sit to/from Stand Sit to Stand: Supervision Stand pivot transfers: Min guard       General transfer comment: for safety, increased time to rise requiring VC for hand placement when rising.  Ambulation/Gait Ambulation/Gait  assistance: Supervision Gait Distance (Feet): 150 Feet Assistive device: Rolling walker (2 wheeled) Gait Pattern/deviations: Decreased stride length;Shuffle;Step-through pattern;Trunk flexed Gait velocity: decr   General Gait Details: supervision for safety, verbal cuing for placement inside RW and PT lowered RW this date to better fit pt. slow, shuffling gait with VCs for increasing step length, increasing upright posture.   Stairs             Wheelchair Mobility    Modified Rankin (Stroke Patients Only)       Balance Overall balance assessment: Needs assistance Sitting-balance support: No upper extremity supported;Feet supported Sitting balance-Leahy Scale: Good     Standing balance support: Single extremity supported;During functional activity Standing balance-Leahy Scale: Fair                              Cognition Arousal/Alertness: Awake/alert Behavior During Therapy: WFL for tasks assessed/performed Overall Cognitive Status: History of cognitive impairments - at baseline                                 General Comments: eager to participate in PT today      Exercises General Exercises - Lower Extremity Hip Flexion/Marching: AROM;Both;Standing;15 reps Mini-Sqauts: AROM;Both;Standing;Seated;5 reps(sit to stands from elevated bed height, focus on slow eccentric lower)    General Comments General comments (skin integrity, edema, etc.): pt states he only wears O2 at night, requests to remove 2LO2 during ambulation. DOE 2/4 after 75 ft ambulation, encouraged standing rest break to recover dyspnea.      Pertinent Vitals/Pain Pain  Assessment: No/denies pain Faces Pain Scale: Hurts a little bit Pain Location: generalized back pain Pain Descriptors / Indicators: Aching;Constant Pain Intervention(s): Monitored during session;Limited activity within patient's tolerance;Premedicated before session    Home Living                       Prior Function            PT Goals (current goals can now be found in the care plan section) Acute Rehab PT Goals Patient Stated Goal: Return to [facility] at d/c PT Goal Formulation: With patient Time For Goal Achievement: 05/15/19 Potential to Achieve Goals: Good Progress towards PT goals: Progressing toward goals    Frequency    Min 3X/week      PT Plan Current plan remains appropriate    Co-evaluation              AM-PAC PT "6 Clicks" Mobility   Outcome Measure  Help needed turning from your back to your side while in a flat bed without using bedrails?: A Little Help needed moving from lying on your back to sitting on the side of a flat bed without using bedrails?: A Little Help needed moving to and from a bed to a chair (including a wheelchair)?: A Little Help needed standing up from a chair using your arms (e.g., wheelchair or bedside chair)?: A Little Help needed to walk in hospital room?: A Little Help needed climbing 3-5 steps with a railing? : A Lot 6 Click Score: 17    End of Session Equipment Utilized During Treatment: Gait belt Activity Tolerance: Patient tolerated treatment well Patient left: in bed;with call bell/phone within reach;with nursing/sitter in room Nurse Communication: Mobility status PT Visit Diagnosis: Other abnormalities of gait and mobility (R26.89);Muscle weakness (generalized) (M62.81)     Time: 4830-3220 PT Time Calculation (min) (ACUTE ONLY): 25 min  Charges:  $Gait Training: 8-22 mins $Therapeutic Exercise: 8-22 mins                    Ryan [name] E, PT Acute Rehabilitation Services Pager [phone]  Office [phone]   Ryan [name] D Ryan [name] 05/04/2019, 12:51 PM

## 2019-05-04 NOTE — Discharge Summary (Signed)
Physician Discharge Summary  Ryan [name] KMQ:[id number] DOB: [birth year] DOA: 04/30/2019  PCP: Roetta Sessions, NP  Admit date: 04/30/2019 Discharge date: 05/04/2019  Admitted From: Independent living facility Disposition: Independent living facility  Recommendations for Outpatient Follow-up:  1. Follow ups as below. 2. Recommend palliative/hospice follow-up 3. Please obtain CBC/BMP/Mag at follow up 4. Please follow up on the following pending results: None  Home Health: PT/OT Equipment/Devices: None  Discharge Condition: Stable CODE STATUS: DNR/DNI.  Follow-up Information    Roetta Sessions, NP. Schedule an appointment as soon as possible for a visit in 1 week(s).   Specialty: Nurse Practitioner Contact information: [address] [place] [address] [phone]        Larey Dresser, MD. Schedule an appointment as soon as possible for a visit in 2 week(s).   Specialty: Cardiology Contact information: [place] [address] [phone]          Hospital Course: 86 year old male with history of ILD on home oxygen, diastolic CHF, NHL pelvic mass s/p radiation therapy, DM-2, CKD-4, anemia of renal disease and recent hospitalization for CVA presenting with bilateral LE swelling, generalized weakness, nausea and poor p.o. intake.  Patient is also multiple diuretics.  He was admitted for acute on chronic renal failure with significant azotemia. Cr 5.3 (baseline 1.9) and BUN of 116.  Started on IV fluid.  Nephrology consulted.  AKI and uremia resolved.  Creatinine down to 1.0.  BUN down to 28.  Resumed home torsemide from 80 to 60 mg twice daily.  Discontinued metolazone.  Evaluated by PT/OT who recommended home health PT/OT.  See individual problem list below for more hospital course.  Discharge Diagnoses:  AKI on CKD-4 with azotemia: Concern for ATN in the setting of diuretics and hypotension.  Renal US without  hydronephrosis. Baseline Cr ~2.0> 5.27> 4.0>1.0. BUN 122>113>28.   Renal function remained stable off IV fluid.  -Recheck renal function at follow-up.  History of CVA: Hospitalized 3/23-3/26 for acute punctate infarct in the right occipital area.  Echo with severe LVH.  CUS with 40 to 59% right ICA stenosis.  Loop recorder revealed A. fib.  On Cardizem, aspirin and statin.  Neuro exam within normal except for stuttering and twitching.  Per patient, he was told by neurology, Dr. Erlinda Hong that his stuttering and twitching are in responses to his back pain. -Continue home aspirin and statin  Paroxysmal A. fib: Rate controlled without medication.  On Cardizem CD 360 mg daily and full dose aspirin at home. -Continue home aspirin -Reduced Cardizem CD to 120 mg daily.  Chronic diastolic CHF: was hypovolemic on admission.  Diuretics held this admission due to AKI. -Discontinued metolazone.  Reduced home torsemide to 60 mg twice daily.  Will continue Aldactone.   Hypovolemic hyponatremia: Likely due to renal failure.  Resolved.  Hypocalcemia-resolved.  History of pulmonary hypertension: Recent echo on 3/24 with normal pulmonary hypertension. -Continue home Adempas and selexipag  Chronic COPD/ILD/chronic RF on 2 L at baseline: Stable. -As needed breathing treatments   Acute on chronic respiratory failure with hypoxia: Multifactorial including COPD, ILD and pulmonary hypertension.  Improved.  He is back on 2 L by nasal cannula.  Normocytic anemia/anemia of renal disease: H&H stable.  Anemia panel normal. -Check CBC at follow-up.  BPH: No UTI symptoms. -Continue home Proscar  Debility -PT/OT-recommended home health PT.  DNR/DNI/goal of care-enrolled with hospice previously.  -Recommend palliative follow-up outpatient.    Discharge Instructions  Discharge Instructions    (HEART FAILURE PATIENTS) Call MD:  Anytime you have any of the following symptoms: 1) 3 pound weight gain in 24  hours or 5 pounds in 1 week 2) shortness of breath, with or without a dry hacking cough 3) swelling in the hands, feet or stomach 4) if you have to sleep on extra pillows at night in order to breathe.   Complete by: As directed    Call MD for:  difficulty breathing, headache or visual disturbances   Complete by: As directed    Call MD for:  extreme fatigue   Complete by: As directed    Call MD for:  persistant dizziness or light-headedness   Complete by: As directed    Call MD for:  persistant nausea and vomiting   Complete by: As directed    Diet - low sodium heart healthy   Complete by: As directed    Discharge instructions   Complete by: As directed    It has been a pleasure taking care of you! You were hospitalized with acute kidney injury/failure.  Your kidney function improved with IV fluid and adjustment of your medications to the point we think it is safe to let you go home and follow-up with your primary care doctor and cardiologist. We made some adjustments to your medications during this hospitalization. Please review your new medication list and the directions before you take your medications.  Please follow-up with your primary care doctor and cardiologist in 1 to 2 weeks.   Take care,   Increase activity slowly   Complete by: As directed      Allergies as of 05/04/2019      Reactions   Pollen Extract-tree Extract Other (See Comments)   HEADACHES, TIRED , DRAINAGE FROM SINUSES   Molds & Smuts Other (See Comments)   Also dust mites causes sinus infections, h/a etc.      Medication List    STOP taking these medications   metolazone 2.5 MG tablet Commonly known as: ZAROXOLYN     TAKE these medications   Adempas 1.5 MG Tabs Generic drug: Riociguat Take 1.5 mg by mouth 3 (three) times daily.   allopurinol 100 MG tablet Commonly known as: ZYLOPRIM Take 50 mg by mouth daily.   aspirin 325 MG tablet Take 1 tablet (325 mg total) by mouth daily.   atorvastatin 40  MG tablet Commonly known as: LIPITOR Take 1 tablet (40 mg total) by mouth daily at 6 PM.   diltiazem 120 MG 24 hr capsule Commonly known as: CARDIZEM CD Take 1 capsule (120 mg total) by mouth daily. What changed:   medication strength  how much to take   escitalopram 10 MG tablet Commonly known as: LEXAPRO Take 10 mg by mouth daily.   finasteride 5 MG tablet Commonly known as: PROSCAR Take 1 tablet (5 mg total) by mouth daily. What changed: when to take this   Flonase Sensimist 27.5 MCG/SPRAY nasal spray Generic drug: fluticasone Place 1 spray into the nose daily as needed for rhinitis or allergies.   ipratropium 0.03 % nasal spray Commonly known as: ATROVENT Place 2 sprays into both nostrils 2 (two) times daily.   latanoprost 0.005 % ophthalmic solution Commonly known as: XALATAN Place 1 drop into both eyes at bedtime.   methocarbamol 500 MG tablet Commonly known as: ROBAXIN Take 500-1,000 mg by mouth every 8 (eight) hours as needed for muscle spasms.   omeprazole 20 MG capsule Commonly known as: PRILOSEC  Take 20 mg by mouth daily.   potassium chloride 10 MEQ tablet Commonly known as: KLOR-CON Take 10 mEq by mouth daily.   rOPINIRole 2 MG tablet Commonly known as: Requip Take 1 tablet (2 mg total) by mouth in the morning, at noon, and at bedtime.   Selexipag 1600 MCG Tabs Take 1.6 mcg by mouth 2 (two) times daily. UPTRAVI   spironolactone 25 MG tablet Commonly known as: ALDACTONE Take 1 tablet (25 mg total) by mouth daily.   torsemide 20 MG tablet Commonly known as: DEMADEX Take 3 tablets (60 mg total) by mouth 2 (two) times daily. What changed: how much to take   traMADol 50 MG tablet Commonly known as: ULTRAM Take 50 mg by mouth every 6 (six) hours as needed for moderate pain.   traZODone 100 MG tablet Commonly known as: DESYREL Take 100 mg by mouth at bedtime.       Consultations:  Nephrology  Procedures/Studies:   DG Chest 2  View  Result Date: 04/24/2019 CLINICAL DATA:  Lower extremity edema. EXAM: CHEST - 2 VIEW COMPARISON:  09/20/2018 FINDINGS: The heart is mildly enlarged but stable. Stable tortuosity of the thoracic aorta. Stable large hiatal hernia containing a good portion of the stomach. There is overlying vascular crowding and atelectasis. Streaky right basilar atelectasis is also noted. No definite infiltrates or effusions. No pulmonary edema. IMPRESSION: 1. Stable cardiac enlargement and large hiatal hernia. 2. Bibasilar atelectasis. Electronically Signed   By: Marijo Sanes M.D.   On: 04/24/2019 10:44   MR ANGIO HEAD WO CONTRAST  Result Date: 04/25/2019 CLINICAL DATA:  Initial evaluation for acute stroke. EXAM: MRA HEAD WITHOUT CONTRAST TECHNIQUE: Angiographic images of the Circle of Willis were obtained using MRA technique without intravenous contrast. COMPARISON:  Prior MRI from 04/24/2019. FINDINGS: ANTERIOR CIRCULATION: Examination moderately degraded by motion artifact. Visualized distal cervical segments of the internal carotid arteries are patent with fairly symmetric antegrade flow. Petrous, cavernous, and supraclinoid ICAs patent without appreciable hemodynamically significant stenosis. ICA termini well perfused. A1 segments patent bilaterally. Normal anterior communicating artery complex. Anterior cerebral arteries patent to their distal aspects without stenosis. No M1 stenosis or occlusion. Grossly negative MCA bifurcations, although evaluation limited by motion. Distal MCA branches fairly well perfused and symmetric. POSTERIOR CIRCULATION: Visualized vertebral arteries patent to the vertebrobasilar junction without stenosis. Left vertebral artery dominant. Neither PICA well visualized. Basilar patent to its distal aspect without stenosis. Superior cerebral arteries patent proximally. Both PCAs primarily supplied via the basilar, although a small right posterior communicating arteries noted. PCAs perfused to  their distal aspects without appreciable stenosis. No obvious intracranial aneurysm on this motion degraded exam. IMPRESSION: 1. Technically limited exam due to motion artifact. 2. Negative intracranial MRA for large vessel occlusion. No proximal high-grade or correctable stenosis identified. Electronically Signed   By: Jeannine Boga M.D.   On: 04/25/2019 19:10   MR BRAIN WO CONTRAST  Result Date: 04/24/2019 CLINICAL DATA:  86 year old male with increasing bilateral lower extremity swelling. Stuttering, lower extremity spasms. EXAM: MRI HEAD WITHOUT CONTRAST TECHNIQUE: Multiplanar, multiecho pulse sequences of the brain and surrounding structures were obtained without intravenous contrast. COMPARISON:  Head CT 09/23/2018. FINDINGS: Brain: Punctate area of abnormal diffusion in the right occipital lobe white matter near the occipital horn on series 4, image 19. Associated mild T2 hyperintensity. No other restricted diffusion or evidence of acute infarction. No chronic cerebral blood products or definite cortical encephalomalacia identified. Axial FLAIR images are degraded by artifact with scattered  generally mild for age T2 and FLAIR hyperintensity. T2 heterogeneity in the bilateral deep gray nuclei appears to be combination of perivascular spaces and occasional small chronic lacunar infarcts. Negative brainstem and cerebellum. No midline shift, mass effect, evidence of mass lesion, ventriculomegaly, extra-axial collection or acute intracranial hemorrhage. Cervicomedullary junction and pituitary are within normal limits. The examination had to be discontinued prior to completion due to excessive patient motion. No coronal T2 imaging was obtained. Vascular: Major intracranial vascular flow voids are preserved. Dominant and dolichoectatic left vertebral artery with underlying generalized intracranial arterial tortuosity. Skull and upper cervical spine: Negative visible cervical spine. Normal bone marrow  signal. Sinuses/Orbits: Negative, postoperative changes to both globes. Other: Mastoids are clear. Visible internal auditory structures appear normal. Scalp and face soft tissues appear negative. IMPRESSION: 1. Punctate diffusion abnormality in the right occipital lobe white matter compatible with a tiny acute infarct. However, clinical significance is unclear. There is no associated hemorrhage or mass effect. 2. No other acute intracranial abnormality identified. Generally mild for age signal changes elsewhere in the brain compatible with chronic small vessel disease. Electronically Signed   By: Genevie Ann M.D.   On: 04/24/2019 19:35   MR LUMBAR SPINE WO CONTRAST  Result Date: 04/17/2019 [facility] NEUR[facility] REPORT STUDY DATE: 04/16/19 PATIENT NAME: MARCELLA DUNNAWAY DOB: 03-10-1931 MRN: [id number] ORDERING CLINICIAN: Kathrynn Ducking, MD CLINICAL HISTORY: 86 year old male with lower extremity pain. EXAM: MR LUMBAR SPINE WO CONTRAST TECHNIQUE: MRI of the lumbar spine was obtained utilizing 4 mm sagittal slices from H96-22 down to the lower sacrum with T1, T2 and inversion recovery views. In addition 4 mm axial slices from W9-7 down to L5-S1 level were included with T1 and T2 weighted views. CONTRAST: none COMPARISON: 12/08/13 IMAGING SITE: [facility] [address] (1.5 Tesla MRI)  FINDINGS: On sagittal views the vertebral bodies have normal height and alignment.  Rotoscoliosis convex right centered at L2-3.  Degenerative spondylosis and disc bulging with anterior and posterior osteophytic projections from L1 down to L4-5.  The conus medullaris terminates at the level of L1.  On axial views: T12-L1:  disc bulging with no spinal stenosis or foraminal narrowing. L1-2: disc bulging and facet hypertrophy with mild left foraminal stenosis. L2-3: disc bulging and facet hypertrophy with moderate left foraminal stenosis and left lateral recess stenosis. L3-4: disc bulging and facet  hypertrophy with severe biforaminal stenosis. L4-5: disc bulging and facet hypertrophy with severe right foraminal stenosis. L5-S1: disc bulging and facet hypertrophy with severe right foraminal stenosis; right laminectomy noted. Limited views of the aorta, kidneys, iliopsoas muscles and sacroiliac joints are notable for right renal cyst measuring 2.6cm.   MRI lumbar spine (without) demonstrating: - Progressive degenerative spondylosis from L1-L5. Rotoscoliosis convex right centered at L2-3.  - At L3-4: disc bulging and facet hypertrophy with severe biforaminal stenosis. - At L4-5: disc bulging and facet hypertrophy with severe right foraminal stenosis. - At L5-S1: disc bulging and facet hypertrophy with severe right foraminal stenosis; right laminectomy noted. - At L2-3: disc bulging and facet hypertrophy with moderate left foraminal stenosis and left lateral recess stenosis. INTERPRETING PHYSICIAN: Penni Bombard, MD Certified in Neurology, Neurophysiology and Neuroimaging [facility] [address], [place], [place] [address] [phone]   NM CARDIAC AMYLOID TUMOR LOC INFLAM SPECT 1 DAY  Result Date: 04/26/2019 CLINICAL DATA:  HEART FAILURE. CONCERN FOR CARDIAC AMYLOIDOSIS. EXAM: NUCLEAR MEDICINE TUMOR LOCALIZATION. PYP CARDIAC AMYLOIDOSIS SCAN WITH SPECT TECHNIQUE: Following  intravenous administration of radiopharmaceutical, anterior planar images of the chest were obtained. Regions of interest were placed on the heart and contralateral chest wall for quantitative assessment. Additional SPECT imaging of the chest was obtained. RADIOPHARMACEUTICALS:  18.8 mCi TECHNETIUM 99 PYROPHOSPHATE FINDINGS: Planar Visual assessment: Anterior planar imaging demonstrates radiotracer uptake within the heart less than than uptake within the adjacent ribs (Grade 0). Quantitative assessment : Quantitative assessment of the cardiac uptake compared to the contralateral chest wall is equal to 1.0  (H/CL = 1.0). SPECT assessment: SPECT imaging of the chest demonstrates no significant radiotracer accumulation within the LEFT ventricle. IMPRESSION: Visual and quantitative assessment (grade 0, H/CLL equal 1.0) are equivocal suggestive of transthyretin amyloidosis. Electronically Signed   By: Kerby Moors M.D.   On: 04/26/2019 16:10   US RENAL  Result Date: 04/30/2019 CLINICAL DATA:  Acute kidney injury. Stage 2 chronic kidney disease with decreased urine output. EXAM: RENAL / URINARY TRACT ULTRASOUND COMPLETE COMPARISON:  Abdominopelvic CT 03/08/2017. Abdominal ultrasound 05/11/2012. FINDINGS: Right Kidney: Renal measurements: 9.7 x 5.1 x 4.5 cm = volume: 116.8 mL. Mild renal cortical thinning. At least 2 small renal cysts are noted, largest in the upper pole measuring up to 2.6 cm. No evidence of hydronephrosis. Left Kidney: Renal measurements: 11.2 x 5.8 x 6.2 cm = volume: 212.4 mL. Mild renal cortical thinning. No hydronephrosis. Bladder: Appears normal for degree of bladder distention. Other: Study is mildly limited by body habitus and limited breath holding ability. IMPRESSION: Similar appearance of the kidneys with small right renal cysts and no hydronephrosis. Electronically Signed   By: Richardean Sale M.D.   On: 04/30/2019 17:04   EP PPM/ICD IMPLANT  Result Date: 04/26/2019 CONCLUSIONS:  1. Successful implantation of a [employer] Reveal LINQ implantable loop recorder for cryptogenic stroke  2. No early apparent complications. Cristopher Peru, MD 04/26/2019 5:41 PM   DG Chest Portable 1 View  Result Date: 04/30/2019 CLINICAL DATA:  Leg swelling EXAM: PORTABLE CHEST 1 VIEW COMPARISON:  04/24/2019 FINDINGS: Low lung volumes. No new consolidation or edema. No pleural effusion or pneumothorax. Large hiatal hernia. IMPRESSION: No acute process in the chest. Electronically Signed   By: Macy Mis M.D.   On: 04/30/2019 14:06   EEG adult  Result Date: 04/25/2019 Lora Havens, MD      04/25/2019  2:07 PM Patient Name: MARCELLE BEBOUT MRN: [id number] Epilepsy Attending: Lora Havens Referring Physician/Provider: Dr. Rosalin Hawking Date: 04/25/2019 Duration: 27.57 minutes Patient history: 86 year old male with intermittent stuttering speech associated with body shaking/leg jerking movements.  EEG to evaluate for seizures. Level of alertness: Awake, drowsy AEDs during EEG study: None Technical aspects: This EEG study was done with scalp electrodes positioned according to the 10-20 International system of electrode placement. Electrical activity was acquired at a sampling rate of _0  and reviewed with a high frequency filter of _1  and a low frequency filter of _2 . EEG data were recorded continuously and digitally stored. Description: The posterior dominant rhythm consists of 9 Hz activity of moderate voltage (25-35 uV) seen predominantly in posterior head regions, symmetric and reactive to eye opening and eye closing. Drowsiness was characterized by attenuation of the posterior background rhythm.  Single sharp transient was seen in right temporal region. Physiologic photic driving was seen during photic stimulation.  Hyperventilation was not performed.  Single EKG strip showed arrhythmia and frequent PVCs. IMPRESSION: This study is within normal limits. No seizures or epileptiform discharges were seen throughout the recording. However, only wakefulness  and drowsiness were recorded. If suspicion for interictal activity remains a concern, a prolonged study including sleep should be considered. Lora Havens   ECHOCARDIOGRAM COMPLETE  Result Date: 04/25/2019    ECHOCARDIOGRAM REPORT   Patient Name:   BURLEIGH BROCKMANN Date of Exam: 04/25/2019 Medical Rec #:  [id number]       Height:       67.0 in Accession #:    [id number]      Weight:       190.3 lb Date of Birth:  [birth year]      BSA:          1.980 m Patient Age:    34 years        BP:           111/64 mmHg Patient Gender: M               HR:            84 bpm. Exam Location:  Inpatient Procedure: 2D Echo, Cardiac Doppler and Color Doppler Indications:    Stroke 434.91 / I163.9  History:        Patient has prior history of Echocardiogram examinations, most                 recent 07/20/2018. CHF, COPD; Risk Factors:Hypertension,                 Diabetes, Dyslipidemia and GERD. Edema. Lymphoma.  Sonographer:    Jonelle Sidle Dance Referring Phys: [place]  1. Left ventricular ejection fraction, by estimation, is 60 to 65%. The left ventricle has normal function. The left ventricle has no regional wall motion abnormalities. There is severe left ventricular hypertrophy. Left ventricular diastolic parameters  were normal.  2. Right ventricular systolic function is normal. The right ventricular size is normal. There is normal pulmonary artery systolic pressure.  3. Left atrial size was moderately dilated.  4. The mitral valve is normal in structure. No evidence of mitral valve regurgitation. No evidence of mitral stenosis.  5. The aortic valve is tricuspid. Aortic valve regurgitation is trivial. No aortic stenosis is present.  6. The inferior vena cava is normal in size with greater than 50% respiratory variability, suggesting right atrial pressure of 3 mmHg. FINDINGS  Left Ventricle: Left ventricular ejection fraction, by estimation, is 60 to 65%. The left ventricle has normal function. The left ventricle has no regional wall motion abnormalities. The left ventricular internal cavity size was normal in size. There is  severe left ventricular hypertrophy. Left ventricular diastolic parameters were normal. Right Ventricle: The right ventricular size is normal. No increase in right ventricular wall thickness. Right ventricular systolic function is normal. There is normal pulmonary artery systolic pressure. The tricuspid regurgitant velocity is 2.51 m/s, and  with an assumed right atrial pressure of 3 mmHg, the estimated right ventricular  systolic pressure is 68.3 mmHg. Left Atrium: Left atrial size was moderately dilated. Right Atrium: Right atrial size was normal in size. Pericardium: There is no evidence of pericardial effusion. Mitral Valve: The mitral valve is normal in structure. Normal mobility of the mitral valve leaflets. No evidence of mitral valve regurgitation. No evidence of mitral valve stenosis. Tricuspid Valve: The tricuspid valve is normal in structure. Tricuspid valve regurgitation is not demonstrated. No evidence of tricuspid stenosis. Aortic Valve: The aortic valve is tricuspid. . There is mild thickening and mild calcification of the aortic valve. Aortic valve regurgitation is trivial. No aortic  stenosis is present. There is mild thickening of the aortic valve. There is mild calcification of the aortic valve. Pulmonic Valve: The pulmonic valve was normal in structure. Pulmonic valve regurgitation is mild. No evidence of pulmonic stenosis. Aorta: The aortic root is normal in size and structure. Venous: The inferior vena cava is normal in size with greater than 50% respiratory variability, suggesting right atrial pressure of 3 mmHg. IAS/Shunts: No atrial level shunt detected by color flow Doppler.  LEFT VENTRICLE PLAX 2D LVIDd:         3.15 cm LVIDs:         2.24 cm LV PW:         0.97 cm LV IVS:        1.65 cm LVOT diam:     2.20 cm LV SV:         103 LV SV Index:   52 LVOT Area:     3.80 cm  RIGHT VENTRICLE             IVC RV Basal diam:  2.65 cm     IVC diam: 1.94 cm RV S prime:     20.30 cm/s TAPSE (M-mode): 1.9 cm LEFT ATRIUM             Index       RIGHT ATRIUM           Index LA diam:        4.40 cm 2.22 cm/m  RA Area:     14.80 cm LA Vol (A2C):   65.8 ml 33.24 ml/m RA Volume:   33.80 ml  17.07 ml/m LA Vol (A4C):   48.5 ml 24.50 ml/m LA Biplane Vol: 57.5 ml 29.04 ml/m  AORTIC VALVE LVOT Vmax:   156.00 cm/s LVOT Vmean:  97.700 cm/s LVOT VTI:    0.270 m  AORTA Ao Root diam: 3.40 cm Ao Asc diam:  3.30 cm MITRAL VALVE                TRICUSPID VALVE MV Area (PHT): 2.54 cm    TR Peak grad:   25.2 mmHg MV Decel Time: 299 msec    TR Vmax:        251.00 cm/s MV E velocity: 81.00 cm/s MV A velocity: 76.00 cm/s  SHUNTS MV E/A ratio:  1.07        Systemic VTI:  0.27 m                            Systemic Diam: 2.20 cm Jenkins Rouge MD Electronically signed by Jenkins Rouge MD Signature Date/Time: 04/25/2019/10:52:16 AM    Final    VAS US CAROTID (at [facility] and [facility] only)  Result Date: 04/25/2019 Carotid Arterial Duplex Study Indications:       CVA. Comparison Study:  No prior study Performing Technologist: Maudry Mayhew MHA, RDMS, RVT, RDCS  Examination Guidelines: A complete evaluation includes B-mode imaging, spectral Doppler, color Doppler, and power Doppler as needed of all accessible portions of each vessel. Bilateral testing is considered an integral part of a complete examination. Limited examinations for reoccurring indications may be performed as noted.  Right Carotid Findings: +----------+--------+--------+--------+-----------------------+--------+           PSV cm/sEDV cm/sStenosisPlaque Description     Comments +----------+--------+--------+--------+-----------------------+--------+ CCA Prox  80      17                                              +----------+--------+--------+--------+-----------------------+--------+  CCA Distal81      16              smooth and heterogenous         +----------+--------+--------+--------+-----------------------+--------+ ICA Prox  150     50      40-59%  smooth and homogeneous          +----------+--------+--------+--------+-----------------------+--------+ ICA Distal76      17                                              +----------+--------+--------+--------+-----------------------+--------+ ECA       93      12              smooth and heterogenous         +----------+--------+--------+--------+-----------------------+--------+  +----------+--------+-------+----------------+-------------------+           PSV cm/sEDV cmsDescribe        Arm Pressure (mmHG) +----------+--------+-------+----------------+-------------------+ [id number]             Multiphasic, WNL                    +----------+--------+-------+----------------+-------------------+ +---------+--------+--------+--------------+ VertebralPSV cm/sEDV cm/sNot identified +---------+--------+--------+--------------+  Left Carotid Findings: +----------+--------+--------+--------+-----------------------+--------+           PSV cm/sEDV cm/sStenosisPlaque Description     Comments +----------+--------+--------+--------+-----------------------+--------+ CCA Prox  145     29                                              +----------+--------+--------+--------+-----------------------+--------+ CCA Distal66      18              smooth and heterogenous         +----------+--------+--------+--------+-----------------------+--------+ ICA Prox  94      20              smooth and heterogenous         +----------+--------+--------+--------+-----------------------+--------+ ICA Distal92      21                                              +----------+--------+--------+--------+-----------------------+--------+ ECA       97      10              smooth and heterogenous         +----------+--------+--------+--------+-----------------------+--------+ +----------+--------+--------+----------------+-------------------+           PSV cm/sEDV cm/sDescribe        Arm Pressure (mmHG) +----------+--------+--------+----------------+-------------------+ [id number]              Multiphasic, WNL                    +----------+--------+--------+----------------+-------------------+ +---------+--------+--+--------+-+---------+ VertebralPSV cm/s52EDV cm/s9Antegrade +---------+--------+--+--------+-+---------+   Summary: Right Carotid:  Velocities in the right ICA are consistent with a 40-59%                stenosis. Left Carotid: Velocities in the left ICA are consistent with a 1-39% stenosis. Vertebrals:  Left vertebral artery demonstrates antegrade flow. Right vertebral              artery was not  visualized. Subclavians: Normal flow hemodynamics were seen in bilateral subclavian              arteries. *See table(s) above for measurements and observations.  Electronically signed by Ruta Hinds MD on 04/25/2019 at 3:59:50 PM.    Final    VAS [place] LOWER EXTREMITY VENOUS (DVT)  Result Date: 04/27/2019  Lower Venous DVTStudy Indications: Stroke.  Limitations: Poor ultrasound/tissue interface. Performing Technologist: Maudry Mayhew MHA, RDMS, RVT, RDCS  Examination Guidelines: A complete evaluation includes B-mode imaging, spectral Doppler, color Doppler, and power Doppler as needed of all accessible portions of each vessel. Bilateral testing is considered an integral part of a complete examination. Limited examinations for reoccurring indications may be performed as noted. The reflux portion of the exam is performed with the patient in reverse Trendelenburg.  +---------+---------------+---------+-----------+----------+--------------+ RIGHT    CompressibilityPhasicitySpontaneityPropertiesThrombus Aging +---------+---------------+---------+-----------+----------+--------------+ CFV      Full           Yes      Yes                                 +---------+---------------+---------+-----------+----------+--------------+ SFJ      Full                                                        +---------+---------------+---------+-----------+----------+--------------+ FV Prox  Full                                                        +---------+---------------+---------+-----------+----------+--------------+ FV Mid   Full                                                         +---------+---------------+---------+-----------+----------+--------------+ FV DistalFull                                                        +---------+---------------+---------+-----------+----------+--------------+ PFV      Full                                                        +---------+---------------+---------+-----------+----------+--------------+ POP      Full           Yes      Yes                                 +---------+---------------+---------+-----------+----------+--------------+ PTV      Full                                                        +---------+---------------+---------+-----------+----------+--------------+  PERO     Full                                                        +---------+---------------+---------+-----------+----------+--------------+   +---------+---------------+---------+-----------+----------+--------------+ LEFT     CompressibilityPhasicitySpontaneityPropertiesThrombus Aging +---------+---------------+---------+-----------+----------+--------------+ CFV      Full           Yes      Yes                                 +---------+---------------+---------+-----------+----------+--------------+ SFJ      Full                                                        +---------+---------------+---------+-----------+----------+--------------+ FV Prox  Full                                                        +---------+---------------+---------+-----------+----------+--------------+ FV Mid   Full                                                        +---------+---------------+---------+-----------+----------+--------------+ FV DistalFull                                                        +---------+---------------+---------+-----------+----------+--------------+ PFV      Full                                                         +---------+---------------+---------+-----------+----------+--------------+ POP      Full           Yes      Yes                                 +---------+---------------+---------+-----------+----------+--------------+ PTV      Full                                                        +---------+---------------+---------+-----------+----------+--------------+   Left Technical Findings: Not visualized segments include peroneal veins.   Summary: RIGHT: - There is no evidence of deep vein thrombosis in the lower extremity.  - No cystic structure found in the popliteal fossa.  LEFT: - There is no  evidence of deep vein thrombosis in the lower extremity. However, portions of this examination were limited- see technologist comments above.  - No cystic structure found in the popliteal fossa.  *See table(s) above for measurements and observations. Electronically signed by Deitra Mayo MD on 04/27/2019 at 1:33:02 PM.    Final        Discharge Exam: Vitals:   05/03/19 2132 05/04/19 0432  BP: 132/80 118/83  Pulse: 87 82  Resp: 20 (!) 22  Temp: 97.9 F (36.6 C) 98.4 F (36.9 C)  SpO2: 96% 99%    GENERAL: No acute distress.  Appears well.  HEENT: MMM.  Vision and hearing grossly intact.  NECK: Supple.  No apparent JVD.  RESP: 96% on 2 L.  No IWOB.  Fair aeration bilaterally. CVS:  RRR. Heart sounds normal.  ABD/GI/GU: Bowel sounds present. Soft. Non tender.  MSK/EXT:  Moves extremities. No apparent deformity or edema.  SKIN: no apparent skin lesion or wound NEURO: Awake, alert and oriented appropriately.  No apparent focal neuro deficit. PSYCH: Calm. Normal affect.   The results of significant diagnostics from this hospitalization (including imaging, microbiology, ancillary and laboratory) are listed below for reference.     Microbiology: Recent Results (from the past 240 hour(s))  SARS CORONAVIRUS 2 (TAT 6-24 HRS) Nasopharyngeal Nasopharyngeal Swab     Status: None    Collection Time: 04/24/19 10:54 PM   Specimen: Nasopharyngeal Swab  Result Value Ref Range Status   SARS Coronavirus 2 NEGATIVE NEGATIVE Final    Comment: (NOTE) SARS-CoV-2 target nucleic acids are NOT DETECTED. The SARS-CoV-2 RNA is generally detectable in upper and lower respiratory specimens during the acute phase of infection. Negative results do not preclude SARS-CoV-2 infection, do not rule out co-infections with other pathogens, and should not be used as the sole basis for treatment or other patient management decisions. Negative results must be combined with clinical observations, patient history, and epidemiological information. The expected result is Negative. Fact Sheet for Patients: SugarRoll.be Fact Sheet for Healthcare Providers: https://www.woods-mathews.com/ This test is not yet approved or cleared by the [place] FDA and  has been authorized for detection and/or diagnosis of SARS-CoV-2 by FDA under an Emergency Use Authorization (EUA). This EUA will remain  in effect (meaning this test can be used) for the duration of the COVID-19 declaration under Section 56 4(b)(1) of the Act, 21 U.S.C. section 360bbb-3(b)(1), unless the authorization is terminated or revoked sooner. Performed at [facility], [place] [address], [place], [place] [address]   MRSA PCR Screening     Status: None   Collection Time: 04/25/19  2:04 AM   Specimen: Nasal Mucosa; Nasopharyngeal  Result Value Ref Range Status   MRSA by PCR NEGATIVE NEGATIVE Final    Comment:        The GeneXpert MRSA Assay (FDA approved for NASAL specimens only), is one component of a comprehensive MRSA colonization surveillance program. It is not intended to diagnose MRSA infection nor to guide or monitor treatment for MRSA infections. Performed at [facility], [place] [address] [place], [place] [address]   Blood culture (routine x 2)     Status: None  (Preliminary result)   Collection Time: 04/30/19  2:25 PM   Specimen: BLOOD RIGHT HAND  Result Value Ref Range Status   Specimen Description BLOOD RIGHT HAND  Final   Special Requests   Final    BOTTLES DRAWN AEROBIC AND ANAEROBIC Blood Culture adequate volume   Culture  Final    NO GROWTH 3 DAYS Performed at [facility], [place] [address], [place], [place] [address]    Report Status PENDING  Incomplete  Blood culture (routine x 2)     Status: None (Preliminary result)   Collection Time: 04/30/19  2:40 PM   Specimen: BLOOD LEFT HAND  Result Value Ref Range Status   Specimen Description BLOOD LEFT HAND  Final   Special Requests   Final    BOTTLES DRAWN AEROBIC AND ANAEROBIC Blood Culture results may not be optimal due to an inadequate volume of blood received in culture bottles   Culture   Final    NO GROWTH 3 DAYS Performed at [facility], [place] [address], [place], [place] [address]    Report Status PENDING  Incomplete  SARS CORONAVIRUS 2 (TAT 6-24 HRS) Nasopharyngeal Nasopharyngeal Swab     Status: None   Collection Time: 04/30/19  8:25 PM   Specimen: Nasopharyngeal Swab  Result Value Ref Range Status   SARS Coronavirus 2 NEGATIVE NEGATIVE Final    Comment: (NOTE) SARS-CoV-2 target nucleic acids are NOT DETECTED. The SARS-CoV-2 RNA is generally detectable in upper and lower respiratory specimens during the acute phase of infection. Negative results do not preclude SARS-CoV-2 infection, do not rule out co-infections with other pathogens, and should not be used as the sole basis for treatment or other patient management decisions. Negative results must be combined with clinical observations, patient history, and epidemiological information. The expected result is Negative. Fact Sheet for Patients: SugarRoll.be Fact Sheet for Healthcare Providers: https://www.woods-mathews.com/ This test is not yet approved or cleared by the  [place] FDA and  has been authorized for detection and/or diagnosis of SARS-CoV-2 by FDA under an Emergency Use Authorization (EUA). This EUA will remain  in effect (meaning this test can be used) for the duration of the COVID-19 declaration under Section 56 4(b)(1) of the Act, 21 U.S.C. section 360bbb-3(b)(1), unless the authorization is terminated or revoked sooner. Performed at [facility], [place] [address] [place], [place] [address]      Labs: BNP (last 3 results) Recent Labs    12/19/18 1529 03/15/19 1206 04/30/19 1257  BNP 36.2 58.8 91.4   Basic Metabolic Panel: Recent Labs  Lab 04/30/19 1257 05/01/19 0338 05/03/19 0233 05/03/19 0806 05/04/19 0746  NA 125* 129* 141 141 137  K 5.0 4.0 4.3 4.6 4.5  CL 89* 89* 105 106 103  CO2 _0 GLUCOSE 116* 75 105* 92 123*  BUN 122* 113* 26* 21 14  CREATININE 5.27* 4.00* 1.00 1.00 1.02  CALCIUM 7.9* 7.4* 8.1* 8.7* 9.3  MG  --   --  2.1  --   --   PHOS  --   --  2.2*  --   --    Liver Function Tests: Recent Labs  Lab 04/30/19 1257 05/03/19 0233  AST 16  --   ALT 21  --   ALKPHOS 99  --   BILITOT 1.3*  --   PROT 7.5  --   ALBUMIN 4.2 2.8*   Recent Labs  Lab 04/30/19 1257  LIPASE 27   No results for input(s): AMMONIA in the last 168 hours. CBC: Recent Labs  Lab 04/30/19 1257 05/01/19 0338 05/03/19 0233 05/04/19 0447  WBC 11.3* 8.2 5.7 6.5  NEUTROABS 9.8*  --   --   --   HGB 12.5* 10.7* 10.8* 11.4*  HCT 38.4* 32.6* 35.0*  36.9*  MCV 88.7 87.6 93.3 92.7  PLT 304 265 253 250   Cardiac Enzymes: No results for input(s): CKTOTAL, CKMB, CKMBINDEX, TROPONINI in the last 168 hours. BNP: Invalid input(s): POCBNP CBG: Recent Labs  Lab 04/27/19 1231 05/02/19 1420 05/03/19 2129 05/04/19 0644  GLUCAP 120* 84 92 121*   D-Dimer No results for input(s): DDIMER in the last 72 hours. Hgb A1c No results for input(s): HGBA1C in the last 72 hours. Lipid Profile No results for input(s):  CHOL, HDL, LDLCALC, TRIG, CHOLHDL, LDLDIRECT in the last 72 hours. Thyroid function studies No results for input(s): TSH, T4TOTAL, T3FREE, THYROIDAB in the last 72 hours.  Invalid input(s): FREET3 Anemia work up Recent Labs    05/03/19 0806  VITAMINB12 192  FOLATE 9.1  FERRITIN 70  TIBC 344  IRON 72  RETICCTPCT 1.7   Urinalysis    Component Value Date/Time   COLORURINE YELLOW 04/30/2019 2025   APPEARANCEUR CLEAR 04/30/2019 2025   LABSPEC 1.011 04/30/2019 2025   LABSPEC 1.025 06/28/2013 1321   PHURINE 5.0 04/30/2019 2025   GLUCOSEU NEGATIVE 04/30/2019 2025   GLUCOSEU Negative 06/28/2013 1321   HGBUR NEGATIVE 04/30/2019 2025   BILIRUBINUR NEGATIVE 04/30/2019 2025   BILIRUBINUR Negative 06/28/2013 1321   KETONESUR NEGATIVE 04/30/2019 2025   PROTEINUR NEGATIVE 04/30/2019 2025   UROBILINOGEN 1.0 01/04/2014 2156   UROBILINOGEN 0.2 06/28/2013 1321   NITRITE NEGATIVE 04/30/2019 2025   LEUKOCYTESUR NEGATIVE 04/30/2019 2025   LEUKOCYTESUR Negative 06/28/2013 1321   Sepsis Labs Invalid input(s): PROCALCITONIN,  WBC,  LACTICIDVEN   Time coordinating discharge: 35 minutes  SIGNED:  Mercy Riding, MD  [facility] 05/04/2019, 9:53 AM  If 7PM-7AM, please contact night-coverage [email or web address] Password [email or web address]

## 2019-05-04 NOTE — Plan of Care (Signed)
  Problem: Health Behavior/Discharge Planning: Goal: Ability to manage health-related needs will improve Outcome: Adequate for Discharge   Problem: Activity: Goal: Activity intolerance will improve Outcome: Adequate for Discharge   Problem: Nutritional: Goal: Ability to make appropriate dietary choices will improve Outcome: Adequate for Discharge   Problem: Respiratory: Goal: Respiratory symptoms related to disease process will be avoided Outcome: Adequate for Discharge

## 2019-05-05 LAB — CULTURE, BLOOD (ROUTINE X 2)
Culture: NO GROWTH
Culture: NO GROWTH
Special Requests: ADEQUATE

## 2019-05-07 ENCOUNTER — Encounter (HOSPITAL_COMMUNITY): Payer: Medicare HMO

## 2019-05-08 ENCOUNTER — Other Ambulatory Visit: Payer: Medicare HMO | Admitting: Hospice

## 2019-05-08 ENCOUNTER — Telehealth: Payer: Self-pay

## 2019-05-08 ENCOUNTER — Other Ambulatory Visit: Payer: Self-pay

## 2019-05-08 ENCOUNTER — Other Ambulatory Visit: Payer: Self-pay | Admitting: *Deleted

## 2019-05-08 DIAGNOSIS — Z515 Encounter for palliative care: Secondary | ICD-10-CM

## 2019-05-08 DIAGNOSIS — I5032 Chronic diastolic (congestive) heart failure: Secondary | ICD-10-CM

## 2019-05-08 NOTE — Progress Notes (Signed)
[facility] Consult Note Telephone: [phone]  Fax: [phone]  PATIENT NAME: Ryan [name] DOB: January 22, 1932 MRN: [id number]  PRIMARY CARE PROVIDER:   Roetta Sessions, NP  REFERRING PROVIDER:  Roetta Sessions, NP [name] [address] [place],  Ryan [name] [id number]  RESPONSIBLE PARTY:   Ryan [name], wife [phone]   RECOMMENDATIONS/PLAN:   Advance Care Planning/Goals of Care:Visit for follow up after hospitalization 04/30/2019 - [id number] for CVA; building trust and discussions of goals of care clarification. Patient is a DNR; does not have a DNR at [name]. NP signed DNR and also uploaded in [facility] today. Goals of care include to maximize quality of life and symptom management. 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 Spoke with spouse Ryan [name] who requested another meeting this coming Friday so patient's children can be present as they discuss goals of care and possibly consider Hospice services moving forward. Symptom management: Patient with increasing weakness  s/p CVA. [name] PT/OT scheduled to start today. Ankles still swollen, patient continues on his multiple diuretics. Patient's medications managed by nurse Ryan [name], per patient and he is compliant with his medications. He said he did not sleep well last night because of  'tenacious pain in my low back and pins and niddles in down my legs that sometimes goes up''. No complaint of pain during visit, FLACC 0. Elevation and compression stocks make his pain worse.  He takes tramadol, Tyelnol; he has ' some hydrocodone from long time ago' to manage his pain. He said massaging and standing sometimes helps. He endorsed shortness of breath on moderate exertion. Education on resting in between activities reiterated. He said he stopped using his CPAP  because it had the wrong setting and has not received another one. He is on oxygen 2L/Min, with no respiratory distress, no recent COPD flare. Follow up: Palliative care will continue to follow patient for goals of care clarification and symptom management. I spent one hour and 16 minutes minutes providing this consultation; time includes chart review and documentation. More than 50% of the time in this consultation was spent on coordinating communication HISTORY OF PRESENT ILLNESS:Ryan [name] a 86 y.o.year oldmalewith multiple medical problems including recent CVA, CHF, COPD, pulmonary fibrosis, HTN, non hodgkin's lymphoma status post radiation. Palliative Care was asked to help address goals of care.  CODE STATUS: DNR  PPS: 50% HOSPICE ELIGIBILITY/DIAGNOSIS: TBD  PAST MEDICAL HISTORY:  Past Medical History:  Diagnosis Date  . Asthma   . Barrett's esophagus   . Benign prostatic hypertrophy   . Bilateral lower extremity edema   . CHF (congestive heart failure) ([facility])   . Chronic fatigue   . COPD (chronic obstructive pulmonary disease) ([facility])   . Diabetes mellitus type 2 in nonobese ([facility]) 04/24/2019  . Diverticulosis of colon (without mention of hemorrhage)   . Esophageal reflux   . Gastritis   . Gluten intolerance   . Hiatal hernia 02/10/11   Noted on CT Scan - Moderate Hiatal Hernia  . Hyperlipemia   . Hypertension   . 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 Melanoma ([facility])    lymphoma  . Microcytic anemia 04/20/2011   . 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 Osteoarthritis  hands/feet,knees, NECK, BACK  . Periaortic lymphadenopathy 02/16/2011  . Personal history of colonic polyps 2004   hyperplastic Dr. Collene Mares  . Pulmonary hyperinflation   . Renal cyst 02/10/11    Noted on CT Scan - Bilateral Renal Cysts  . S/P radiation therapy 03/15/11 - 04/09/11   Abdominal/  Pelvic Tumor, 3600 cGy/20 Fractions    SOCIAL HX:  Social History   Tobacco Use  . Smoking status: Former Smoker    Packs/day: 1.00    Years: 35.00    Pack years: 35.00    Types: Pipe, Cigarettes    Quit date: 02/23/1992    Years since quitting: 27.2  . Smokeless tobacco: Never Used  . Tobacco comment: quit 20 years ago  Substance Use Topics  . Alcohol use: Yes    Comment: 2 drinks daily scotch  AND WINE WITH SUPPER    ALLERGIES:  Allergies  Allergen Reactions  . Pollen Extract-Tree Extract Other (See Comments)    HEADACHES, TIRED , DRAINAGE FROM SINUSES  . Molds & Smuts Other (See Comments)    Also dust mites causes sinus infections, h/a etc.     PERTINENT MEDICATIONS:  Outpatient Encounter Medications as of 05/08/2019  Medication Sig  . allopurinol (ZYLOPRIM) 100 MG tablet Take 50 mg by mouth daily.  Marland Kitchen aspirin 325 MG tablet Take 1 tablet (325 mg total) by mouth daily.  Marland Kitchen atorvastatin (LIPITOR) 40 MG tablet Take 1 tablet (40 mg total) by mouth daily at 6 PM.  . diltiazem (CARDIZEM CD) 120 MG 24 hr capsule Take 1 capsule (120 mg total) by mouth daily.  Marland Kitchen escitalopram (LEXAPRO) 10 MG tablet Take 10 mg by mouth daily.  . finasteride (PROSCAR) 5 MG tablet Take 1 tablet (5 mg total) by mouth daily. (Patient taking differently: Take 5 mg by mouth at bedtime. )  . FLONASE SENSIMIST 27.5 MCG/SPRAY nasal spray Place 1 spray into the nose daily as needed for rhinitis or allergies.   Marland Kitchen ipratropium (ATROVENT) 0.03 % nasal spray Place 2 sprays into both nostrils 2 (two) times daily.  Marland Kitchen latanoprost (XALATAN) 0.005 % ophthalmic solution Place 1 drop into both eyes at bedtime.   . methocarbamol (ROBAXIN) 500 MG tablet Take 500-1,000 mg by mouth every 8 (eight) hours as needed for muscle spasms.  Marland Kitchen omeprazole (PRILOSEC) 20 MG capsule Take 20 mg by mouth daily.  . potassium chloride (KLOR-CON) 10 MEQ tablet Take 10 mEq by mouth daily.  . Riociguat (ADEMPAS) 1.5 MG TABS Take 1.5 mg by mouth 3  (three) times daily.  Marland Kitchen rOPINIRole (REQUIP) 2 MG tablet Take 1 tablet (2 mg total) by mouth in the morning, at noon, and at bedtime.  . Selexipag 1600 MCG TABS Take 1.6 mcg by mouth 2 (two) times daily. UPTRAVI  . spironolactone (ALDACTONE) 25 MG tablet Take 1 tablet (25 mg total) by mouth daily.  Marland Kitchen torsemide (DEMADEX) 20 MG tablet Take 3 tablets (60 mg total) by mouth 2 (two) times daily.  . traMADol (ULTRAM) 50 MG tablet Take 50 mg by mouth every 6 (six) hours as needed for moderate pain.   . traZODone (DESYREL) 100 MG tablet Take 100 mg by mouth at bedtime.   No facility-administered encounter medications on file as of 05/08/2019.    PHYSICAL EXAM/ROS:   General: NAD, cooperative Cardiovascular: regular rate and rhythm Pulmonary: on oxygen supplementation 2L/Min Abdomen: soft, nontender, + bowel sounds GU: no suprapubic tenderness Extremities: chronic edema 2+ pitting , no joint deformities Skin: no rashes  to exposed skin Neurological: Weakness but otherwise nonfocal  Teodoro Spray, NP

## 2019-05-08 NOTE — Telephone Encounter (Signed)
Patient scheduled to see Tommye Standard, PA-C, on 05/10/19. Plan to address [place] at that appointment.

## 2019-05-08 NOTE — Patient Outreach (Addendum)
[facility]) Care Management  05/08/2019  Ryan [name] [birth year] [id number]   Subjective: Telephone call to patient's home  / mobile number, no answer, left HIPAA compliant voicemail message, and requested call back.    Objective: Per KPN (Knowledge Performance Now, point of care tool) and chart review, patient hospitalized 04/30/2019 - 05/04/2019 for AKI on Chronic Kidney Disease Stage 4 with azotemia.   Patient hospitalized   04/24/2019 - 04/30/2019 for Acute CVA (cerebrovascular accident).    Patient also has a history of Non Hodgkin's lymphoma pelvic mass status post radiation treatment, diabetes, hypertension, Acute on chronic diastolic CHF (congestive heart failure), Pulmonary hypertension, interstitial lung disease on home oxygen, anemia, Asthma, Barrett's esophagus, Benign prostatic hypertrophy, Bilateral lower extremity edema, COPD (chronic obstructive pulmonary disease), Diverticulosis of colon , Gluten intolerance, Hyperlipemia, Hiatal hernia, Melanoma, Osteoarthritis, Periaortic lymphadenopathy, and Paroxysmal Atrial Fibrillation.       Assessment: Received [name] EMMI Stroke Red Flag Alert follow up referral on 04/30/2019.   Red Flag Alert Triggers, Day # 1, times 3, patient answered yes to the following question: Feeling worse overall?   Patient answered no to the following questions: Able to eat and drink?  Scheduled a follow-up appointment?    [facility] EMMI follow up pending patient contact.      Plan: RNCM will send unsuccessful outreach letter, [facility] pamphlet, handout: Know Before You Go, will call patient for 2nd telephone outreach attempt within 4 business days, [facility] EMMI follow up, and proceed with case closure, within 10 business days if no return call after 3rd unsuccessful outreach call.      [name] H. Annia Friendly, BSN, [place] Management [facility] Telephonic CM Phone: [phone] Fax: [phone]

## 2019-05-08 NOTE — Telephone Encounter (Signed)
Linq alert received for AF episodes, some potentially true, some false.  ILR implanted 04/26/19 due to CVA.  Since that time, AF has been documented, Eliquis and Diltiazem were ordered.  Pt rehospitalized due to renal failure and Eliquis currently on hold.  Pt DC on palliative/ hospice care, DC meds include Diltiazem and ASA375m.  Pt has scheduled OV on 4/8 with Renee, PA.

## 2019-05-09 ENCOUNTER — Telehealth: Payer: Self-pay | Admitting: Neurology

## 2019-05-09 NOTE — Telephone Encounter (Signed)
I called pt about wanting a MD that's here weekly and full time. Dr. Jaynee Eagles agreed to see pt from phone note 04/23/2019. I reminded pt of his EEG on 05/16/2019. PT stated he did not schedule that appt and will not be coming. He stated " I have an appt with cardiology that same day and I did not make appt". He requested the appt be cancel.  PT schedule with Dr .Jaynee Eagles on 05/08/2019 at 0100pm. Bethany RN made aware of using the 100pm slot.

## 2019-05-09 NOTE — Telephone Encounter (Signed)
Patient would like to be referred to another physician that is in weekly and requested a CB from RN to discuss his apts and recent imaging he had done

## 2019-05-09 NOTE — Progress Notes (Deleted)
Cardiology Office Note Date:  05/09/2019  Patient ID:  Ryan [name], Ryan [name] 1931-10-25, MRN [id number] PCP:  Roetta Sessions, NP  Cardiologist:  Dr. Gwenlyn Found AHF: Dr. Aundra Dubin EP: Dr. Lovena Le  ***refresh   Chief Complaint: *** ILR wound check  History of Present Illness: LEANTHONY [name] is a 86 y.o. 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  Patient thought to have [place] out of proportion to his lung disease (ILD, NSIP versus UIP). RHC in 2/20 showed mean PA pressure 41, PVR 5.1 WU, CI 3. Echo in 6/20 with RV reportedly ok =>Echo reviewed by Dr. Aundra Dubin who felt that RV was well-visualized. Patient did not tolerate Adcirca due to edema/?cognitive effects. RHC was done again in 6/20, showed moderate pulmonary arterial hypertension with PVR 5.35 (comparable to prior). Serological workup for PH was negative. Patient started ambrisentan 5 mg and developed significant edema/volume overload despite a high dose of torsemide. There was concern  that ambrisentan may have been part of the reason for the peripheral edema/volume overload. He is now on Uptravi and feels like he is doing better overall.   He was hospitalized last month w/ complaints of stuttering and increased abnormal movements of lower extremity. Also w/ increased LEE. No respiratory difficulty. Labs were normal. MRI of brain performed and showed diffuse abnormality in the rt occipital area concerning for new CVA. Neurology consulted. Placed on high dose ASA and high intensity statin. LDL 103. Hgb A1c 5.9. Tele showed NSR w/ frequent PACS and PVCs. No afib/flutter detected.  Carotid without obstructive disease. 2D echo and shows normal LVEF 60-65%, severe LVH and normal RV.  AHF team consulted to the case, in d/w neurology stroke did not explain his speech difficulties or RLS type .  He was volume OL< diuresed.  Given his  incidental CVA noted, EP was asked to see about monitoring.  Neuro did not feel TEE was warrented and loop was implanted, discharged 04/27/2019.  Unclear etiology of his stuttered speech and LE movements, started on Ropinirole and planned neuro follow up.  His loop noted AFib was ordered to stop ASA and start Eliquis, though when our office reached out to the patient to discuss changes, he was enroute to the ER  readmitted 04/30/2019 with reduced urine output, nausea, increasing edema, found in AKI (CKD) with Creat 5.2, NA was 125. Nephrology consulted, not felt candidate for HD given significant comorbid conditions, and suggested revisiting palliative care.  He did see palliative, was hopefu; for recovery to meaningful state, though did make himeself DNR, wanted continued home palliative care and if recurrent decline in health Hospice at that time. Medicine notes PAFib that was rate controlled, there was an episode of stutterting episode and some UE twitching that was felt 2/2 uremia, he was initially started on heparin gtt with plans to transition to [facility] when renal function allowed, though discharged on ASA and dilt Discharged 05/04/2019 I did not see why no a/c was planned/started Discharge labs NA 137 K+ 4.5 BUN/Creat 14/1.02 H/H 11/36   *** bleeding hx?, any contraindication for [place]? *** chads is 8 *** meds *** volume  Device information MDT loop recorder, implanted 04/26/2019 for cryptogenic stroke.  Past Medical History:  Diagnosis Date  . Asthma   . Barrett's esophagus   . Benign prostatic hypertrophy   . Bilateral lower extremity edema   . CHF (congestive heart  failure) ([place])   . Chronic fatigue   . COPD (chronic obstructive pulmonary disease) ([place])   . Diabetes mellitus type 2 in nonobese ([place]) 04/24/2019  . Diverticulosis of colon (without mention of hemorrhage)   . Esophageal reflux   . Gastritis   . Gluten intolerance   . Hiatal hernia 02/10/11   Noted on CT Scan -  Moderate Hiatal Hernia  . Hyperlipemia   . Hypertension   . Lymphoma of lymph nodes in pelvis ([place]) 03/03/2011    Large Right Retroperitoneal Mass  . Melanoma ([place])    lymphoma  . Microcytic anemia 04/20/2011   . 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 Osteoarthritis    hands/feet,knees, NECK, BACK  . Periaortic lymphadenopathy 02/16/2011  . Personal history of colonic polyps 2004   hyperplastic Dr. Collene Mares  . Pulmonary hyperinflation   . Renal cyst 02/10/11    Noted on CT Scan - Bilateral Renal Cysts  . S/P radiation therapy 03/15/11 - 04/09/11   Abdominal/ Pelvic Tumor, 3600 cGy/20 Fractions    Past Surgical History:  Procedure Laterality Date  . ARTHROSCOPIC REPAIR ACL     right  . BONE MARROW ASPIRATION  02/25/11   Bone Marrow, Aspirate, Clot, and Bilateral Bx, Right PIC  . CATARACT EXTRACTION, BILATERAL    . EYE SURGERY    . HERNIA REPAIR     LEFT INGUINAL   . INSERTION OF MESH N/A 08/23/2012   Procedure: INSERTION OF MESH;  Surgeon: Madilyn Hook, DO;  Location: [facility];  Service: General;  Laterality: N/A;  . LOOP RECORDER INSERTION N/A 04/26/2019   Procedure: LOOP RECORDER INSERTION;  Surgeon: Evans Lance, MD;  Location: [facility];  Service: Cardiovascular;  Laterality: N/A;  . LUMBAR LAMINECTOMY/DECOMPRESSION MICRODISCECTOMY Right 12/31/2013   Procedure: RIGHT L4-5 L5-S1 LAMINECTOMY;  Surgeon: Kristeen Miss, MD;  Location: [facility];  Service: Neurosurgery;  Laterality: Right;  RIGHT L4-5 L5-S1 LAMINECTOMY  . RIGHT HEART CATH N/A 07/25/2018   Procedure: RIGHT HEART CATH;  Surgeon: Larey Dresser, MD;  Location: [name] [facility];  Service: Cardiovascular;  Laterality: N/A;  . ROTATOR CUFF REPAIR     right  . TONSILLECTOMY    . TONSILLECTOMY    . VENTRAL HERNIA REPAIR N/A 08/23/2012   Procedure: HERNIA REPAIR VENTRAL ADULT;  Surgeon: Madilyn Hook, DO;  Location: [facility];  Service: General;  Laterality: N/A;      Current Outpatient Medications  Medication Sig Dispense Refill  . allopurinol (ZYLOPRIM) 100 MG tablet Take 50 mg by mouth daily.    Marland Kitchen aspirin 325 MG tablet Take 1 tablet (325 mg total) by mouth daily. 30 tablet 1  . atorvastatin (LIPITOR) 40 MG tablet Take 1 tablet (40 mg total) by mouth daily at 6 PM. 30 tablet 1  . diltiazem (CARDIZEM CD) 120 MG 24 hr capsule Take 1 capsule (120 mg total) by mouth daily. 90 capsule 1  . escitalopram (LEXAPRO) 10 MG tablet Take 10 mg by mouth daily.    . finasteride (PROSCAR) 5 MG tablet Take 1 tablet (5 mg total) by mouth daily. (Patient taking differently: Take 5 mg by mouth at bedtime. ) 30 tablet 5  . FLONASE SENSIMIST 27.5 MCG/SPRAY nasal spray Place 1 spray into the nose daily as needed for rhinitis or allergies.     Marland Kitchen ipratropium (ATROVENT) 0.03 % nasal spray Place 2 sprays into both nostrils 2 (two) times daily.    Marland Kitchen  latanoprost (XALATAN) 0.005 % ophthalmic solution Place 1 drop into both eyes at bedtime.     . methocarbamol (ROBAXIN) 500 MG tablet Take 500-1,000 mg by mouth every 8 (eight) hours as needed for muscle spasms.    Marland Kitchen omeprazole (PRILOSEC) 20 MG capsule Take 20 mg by mouth daily.    . potassium chloride (KLOR-CON) 10 MEQ tablet Take 10 mEq by mouth daily.    . Riociguat (ADEMPAS) 1.5 MG TABS Take 1.5 mg by mouth 3 (three) times daily.    Marland Kitchen rOPINIRole (REQUIP) 2 MG tablet Take 1 tablet (2 mg total) by mouth in the morning, at noon, and at bedtime. 90 tablet 3  . Selexipag 1600 MCG TABS Take 1.6 mcg by mouth 2 (two) times daily. UPTRAVI    . spironolactone (ALDACTONE) 25 MG tablet Take 1 tablet (25 mg total) by mouth daily. 30 tablet 0  . torsemide (DEMADEX) 20 MG tablet Take 3 tablets (60 mg total) by mouth 2 (two) times daily. 180 tablet 0  . traMADol (ULTRAM) 50 MG tablet Take 50 mg by mouth every 6 (six) hours as needed for moderate pain.     . traZODone (DESYREL) 100 MG tablet Take 100 mg by mouth at bedtime.     No current  facility-administered medications for this visit.    Allergies:   Pollen extract-tree extract and Molds & smuts   Social History:  The patient  reports that he quit smoking about 27 years ago. His smoking use included pipe and cigarettes. He has a 35.00 pack-year smoking history. He has never used smokeless tobacco. He reports current alcohol use. He reports that he does not use drugs.   Family History:  The patient's family history includes ADD / ADHD in an other family member; Heart disease in his father; Hyperlipidemia in an other family member; Hypertension in an other family member; Prostate cancer in his brother, paternal uncle, paternal uncle, and paternal uncle; Stroke in an other family member; Urticaria in his father.  ROS:  Please see the history of present illness.   All other systems are reviewed and otherwise negative.   PHYSICAL EXAM: *** VS:  There were no vitals taken for this visit. BMI: There is no height or weight on file to calculate BMI. Well nourished, well developed, in no acute distress  HEENT: normocephalic, atraumatic  Neck: no JVD, carotid bruits or masses Cardiac:  *** RRR; no significant murmurs, no rubs, or gallops Lungs:  C*** CTA b/l, no wheezing, rhonchi or rales  Abd: soft, nontender MS: no deformity or *** atrophy Ext: *** no edema  Skin: warm and dry, no rash Neuro:  No gross deficits appreciated Psych: euthymic mood, full affect  *** ILR site is stable, no tethering or discomfort   EKG:  Not done today  ILR interrogation done today and reviewed by myself; ***  04/26/2019: Cardiac amyloid study/PYP scan IMPRESSION: Visual and quantitative assessment (grade 0, H/CLL equal 1.0) are equivocal suggestive of transthyretin amyloidosis.   04/25/2019: TTE IMPRESSIONS  1. Left ventricular ejection fraction, by estimation, is 60 to 65%. The  left ventricle has normal function. The left ventricle has no regional  wall motion abnormalities. There is  severe left ventricular hypertrophy.  Left ventricular diastolic parameters  were normal.  2. Right ventricular systolic function is normal. The right ventricular  size is normal. There is normal pulmonary artery systolic pressure.  3. Left atrial size was moderately dilated.  4. The mitral valve is normal in  structure. No evidence of mitral valve  regurgitation. No evidence of mitral stenosis.  5. The aortic valve is tricuspid. Aortic valve regurgitation is trivial.  No aortic stenosis is present.  6. The inferior vena cava is normal in size with greater than 50%  respiratory variability, suggesting right atrial pressure of 3 mmHg.     07/25/2018; RHC 1. Optimized right and left heart filling pressures.  2. Moderate pulmonary arterial hypertension with PVR 5.35 WU, comparable to prior RHC.  3. Low but not markedly low cardiac output.     09/12/2015; EST  The left ventricular ejection fraction is normal (55-65%).  Nuclear stress EF: 55%.  There was no ST segment deviation noted during stress.  The study is normal.   Normal stress nuclear study with no ischemia or infarction; EF 55 with normal wall motion.   Recent Labs: 04/30/2019: ALT 21; B Natriuretic Peptide 73.5 05/03/2019: Magnesium 2.1 05/04/2019: BUN 14; Creatinine, Ser 1.02; Hemoglobin 11.4; Platelets 250; Potassium 4.5; Sodium 137  04/25/2019: Cholesterol 164; HDL 35; LDL Cholesterol 103; Total CHOL/HDL Ratio 4.7; Triglycerides 132; VLDL 26   Estimated Creatinine Clearance: 54.1 mL/min (by C-G formula based on SCr of 1.02 mg/dL).   Wt Readings from Last 3 Encounters:  05/03/19 180 lb (81.6 kg)  04/26/19 184 lb 14.4 oz (83.9 kg)  03/15/19 194 lb (88 kg)     Other studies reviewed: Additional studies/records reviewed today include: summarized above  ASSESSMENT AND PLAN:  1. Cryptogenic stroke     S/p loop implant     *** site  2. Paroxysmal Afib     CHA2DS2Vasc is 8     ***  3. Chronic CHF  (disatolic)     PYP scan as above     F/u with Dr. Aundra Dubin next week   Disposition: F/u with ***  Current medicines are reviewed at length with the patient today.  The patient did not have any concerns regarding medicines.***  Signed, Tommye Standard, PA-C 05/09/2019 5:54 AM     [facility] [address] [place] [address] [phone] (office)  [phone] (fax)

## 2019-05-10 ENCOUNTER — Encounter: Payer: Medicare HMO | Admitting: Physician Assistant

## 2019-05-11 ENCOUNTER — Other Ambulatory Visit: Payer: Self-pay

## 2019-05-11 ENCOUNTER — Other Ambulatory Visit: Payer: Self-pay | Admitting: *Deleted

## 2019-05-11 ENCOUNTER — Other Ambulatory Visit: Payer: Medicare HMO | Admitting: Hospice

## 2019-05-11 DIAGNOSIS — I5032 Chronic diastolic (congestive) heart failure: Secondary | ICD-10-CM

## 2019-05-11 DIAGNOSIS — Z515 Encounter for palliative care: Secondary | ICD-10-CM

## 2019-05-11 NOTE — Progress Notes (Signed)
[facility] Palliative Care Consult Note Telephone: [phone]  Fax: [phone]  PATIENT NAME: Ryan [name] DOB: 05/19/1931 MRN: [id number]  PRIMARY CARE PROVIDER:   Roetta Sessions, NP  REFERRING PROVIDER:  Roetta Sessions, NP [address] [place],  [place] [address] RESPONSIBLE PARTY:Ryan [name], wife [phone]   RECOMMENDATIONS/PLAN:   Advance Care Planning/Goals of Care: Meeting with patient, spouse and two daughters. They were interested in clarifying the differences between palliative services and hospice. They also mentioned changing patient's DNR code status to Full code. They shared their misconceptions of the enquired services based on information received at the hospital. Extensive discussions and education on palliative vs hospice services. They verbalized understanding of Palliative Medicine as specialized medical care for people living with serious illness, aimed at facilitating advance care plan, symptoms relief and establishing goals of care. Hospice is for people with prognosis of less than 6 months. MOST form discussions, per family request to further help in goals of care clarification. Selections include limited additional intervention, IV fluids as indicated, Antibiotics as indicated and tube feeding for a trial period. At the end, Ryan [name], patient and daughters upheld DNR status for patient. MOST form signed today and uploaded in Epic to replace the old one. Family verbalized confidence that palliative service is appropriate for patient at this time; he will be monitored and transitioned to Hospice service in the future when he is appropriate for it. Symptom management: weakness  s/p hospitalization 04/30/2019 - 4/22021 for CVA; weakness improving with Home health PT/OT Patient said he walked to and from the hallway >150fet without difficulty. He reported having the best night sleep ever last night because  he took Aleve and Tylenol and they were effective in controlling his low back pain. NP validated, providing education that a combination of Tylenol and Ibuprofen has been studied and found to be more efficacious than hydrocodone and oxycodone. NP affirmed patient can tale  2045mNSAID- Ibuprofen with 50041mylenol TID PRN low back pain. He was advised to take NSAID only on days he needs it and with food because it can result in GI discomforts/bleed. Also education on Tyelnol and its renal effects and the warning not to reach 3000m19my. NP discouraged the use of hydrocodone which he said he had over seven years ago - expired. Heat, Ice, loose clothing  and other non pharmacological interventions also discussed. Family very appreciative of validation and the information provided.    Ankles still swollen, patient continues on his multiple diuretics. Patient's medications managed by [facility], per patient and he is compliant with his medications.  No complaint of pain during visit, FLACC 0; participated in discussions without difficulty. He endorsed shortness of breath on moderate exertion. Education on resting in between activities reiterated. He had stopped using his CPAP because it had the wrong setting andhas not received another one. He is on oxygen 2L/Min, with no respiratory distress, no recent COPD flare. Follow up: Palliative care will continue to follow patient for goals of care clarification and symptom management. I spent two hours and 16 minutes minutes providing this consultation; time includes chart review and documentation. More than 50% of the time in this consultation was spent on coordinating communication HISTORY OF PRESENT ILLNESS:Ryan [name] a 86 y44.year oldmalewith multiple medical problems including recent CVA, CHF, COPD, pulmonary fibrosis, HTN, non hodgkin's lymphoma status post radiation. Palliative Care was asked to help address goals  of  care.   CODE STATUS: DNR  PPS: 50% HOSPICE ELIGIBILITY/DIAGNOSIS: TBD  PAST MEDICAL HISTORY:  Past Medical History:  Diagnosis Date  . Asthma   . Barrett's esophagus   . Benign prostatic hypertrophy   . Bilateral lower extremity edema   . CHF (congestive heart failure) ([place])   . Chronic fatigue   . COPD (chronic obstructive pulmonary disease) ([place])   . Diabetes mellitus type 2 in nonobese ([place]) 04/24/2019  . Diverticulosis of colon (without mention of hemorrhage)   . Esophageal reflux   . Gastritis   . Gluten intolerance   . Hiatal hernia 02/10/11   Noted on CT Scan - Moderate Hiatal Hernia  . Hyperlipemia   . Hypertension   . Lymphoma of lymph nodes in pelvis ([place]) 03/03/2011    Large Right Retroperitoneal Mass  . Melanoma ([place])    lymphoma  . Microcytic anemia 04/20/2011   . 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 Osteoarthritis    hands/feet,knees, NECK, BACK  . Periaortic lymphadenopathy 02/16/2011  . Personal history of colonic polyps 2004   hyperplastic Dr. Collene Mares  . Pulmonary hyperinflation   . Renal cyst 02/10/11    Noted on CT Scan - Bilateral Renal Cysts  . S/P radiation therapy 03/15/11 - 04/09/11   Abdominal/ Pelvic Tumor, 3600 cGy/20 Fractions    SOCIAL HX:  Social History   Tobacco Use  . Smoking status: Former Smoker    Packs/day: 1.00    Years: 35.00    Pack years: 35.00    Types: Pipe, Cigarettes    Quit date: 02/23/1992    Years since quitting: 27.2  . Smokeless tobacco: Never Used  . Tobacco comment: quit 20 years ago  Substance Use Topics  . Alcohol use: Yes    Comment: 2 drinks daily scotch  AND WINE WITH SUPPER    ALLERGIES:  Allergies  Allergen Reactions  . Pollen Extract-Tree Extract Other (See Comments)    HEADACHES, TIRED , DRAINAGE FROM SINUSES  . Molds & Smuts Other (See Comments)    Also dust mites causes sinus infections, h/a etc.     PERTINENT MEDICATIONS:  Outpatient  Encounter Medications as of 05/11/2019  Medication Sig  . allopurinol (ZYLOPRIM) 100 MG tablet Take 50 mg by mouth daily.  Marland Kitchen aspirin 325 MG tablet Take 1 tablet (325 mg total) by mouth daily.  Marland Kitchen atorvastatin (LIPITOR) 40 MG tablet Take 1 tablet (40 mg total) by mouth daily at 6 PM.  . diltiazem (CARDIZEM CD) 120 MG 24 hr capsule Take 1 capsule (120 mg total) by mouth daily.  Marland Kitchen escitalopram (LEXAPRO) 10 MG tablet Take 10 mg by mouth daily.  . finasteride (PROSCAR) 5 MG tablet Take 1 tablet (5 mg total) by mouth daily. (Patient taking differently: Take 5 mg by mouth at bedtime. )  . FLONASE SENSIMIST 27.5 MCG/SPRAY nasal spray Place 1 spray into the nose daily as needed for rhinitis or allergies.   Marland Kitchen ipratropium (ATROVENT) 0.03 % nasal spray Place 2 sprays into both nostrils 2 (two) times daily.  Marland Kitchen latanoprost (XALATAN) 0.005 % ophthalmic solution Place 1 drop into both eyes at bedtime.   . methocarbamol (ROBAXIN) 500 MG tablet Take 500-1,000 mg by mouth every 8 (eight) hours as needed for muscle spasms.  Marland Kitchen omeprazole (PRILOSEC) 20 MG capsule Take 20 mg by mouth daily.  . potassium chloride (KLOR-CON) 10 MEQ tablet Take 10 mEq by mouth daily.  Marland Kitchen  Riociguat (ADEMPAS) 1.5 MG TABS Take 1.5 mg by mouth 3 (three) times daily.  Marland Kitchen rOPINIRole (REQUIP) 2 MG tablet Take 1 tablet (2 mg total) by mouth in the morning, at noon, and at bedtime.  . Selexipag 1600 MCG TABS Take 1.6 mcg by mouth 2 (two) times daily. UPTRAVI  . spironolactone (ALDACTONE) 25 MG tablet Take 1 tablet (25 mg total) by mouth daily.  Marland Kitchen torsemide (DEMADEX) 20 MG tablet Take 3 tablets (60 mg total) by mouth 2 (two) times daily.  . traMADol (ULTRAM) 50 MG tablet Take 50 mg by mouth every 6 (six) hours as needed for moderate pain.   . traZODone (DESYREL) 100 MG tablet Take 100 mg by mouth at bedtime.   No facility-administered encounter medications on file as of 05/11/2019.    PHYSICAL EXAM/ROS:   General: NAD, cooperative Cardiovascular:  regular rate and rhythm; denies chest pain/discomfort Pulmonary: on oxygen supplementation 2L/Min Sat 99%, normal respiratory effort Abdomen: soft, nontender, + bowel sounds; no reported constipation GU: no suprapubic tenderness Extremities: chronic edema 2+ pitting , no joint deformities Skin: no rashes to exposed skin Neurological: Weakness but otherwise nonfocal.  Teodoro Spray, NP

## 2019-05-11 NOTE — Patient Outreach (Signed)
[name]) Care Management  05/11/2019  Ryan [name] 10-05-1931 [id number]   Subjective: Received voicemail message from Ryan [name], states he is returning call, and requested call back.   Telephone call to patient's home  / mobile number, no answer, left HIPAA compliant voicemail message, and requested call back.    Objective: Per KPN (Knowledge Performance Now, point of care tool) and chart review, patient hospitalized 04/30/2019 - 05/04/2019 for AKI on Chronic Kidney Disease Stage 4 with azotemia.   Patient hospitalized   04/24/2019 - 04/30/2019 for Acute CVA (cerebrovascular accident).    Patient also has a history of Non Hodgkin's lymphoma pelvic mass status post radiation treatment, diabetes, hypertension, Acute on chronic diastolic CHF (congestive heart failure), Pulmonary hypertension, interstitial lung disease on home oxygen, anemia, Asthma, Barrett's esophagus, Benign prostatic hypertrophy, Bilateral lower extremity edema, COPD (chronic obstructive pulmonary disease), Diverticulosis of colon , Gluten intolerance, Hyperlipemia, Hiatal hernia, Melanoma, Osteoarthritis, Periaortic lymphadenopathy, and Paroxysmal Atrial Fibrillation.       Assessment: Received Bernadene Person EMMI Stroke Red Flag Alert follow up referral on 04/30/2019.  Red Flag Alert Triggers, Day # 1, times 3, patient answered yes to the following question: Feeling worse overall?   Patient answered no to the following questions: Able to eat and drink?  Scheduled a follow-up appointment?    [facility] EMMI follow up pending patient contact.      Plan: RNCM has send unsuccessful outreach letter, [facility] pamphlet, handout: Know Before You Go, will call patient for 3rd telephone outreach attempt within 4 business days, [facility] EMMI follow up, and proceed with case closure, within 10 business days if no return call after 3rd unsuccessful outreach call.     Shontez Sermon H. Annia Friendly, BSN, [place]  Management [facility] Telephonic CM Phone: [phone] Fax: [phone]

## 2019-05-15 ENCOUNTER — Other Ambulatory Visit: Payer: Self-pay

## 2019-05-15 ENCOUNTER — Other Ambulatory Visit: Payer: Medicare HMO | Admitting: Hospice

## 2019-05-15 DIAGNOSIS — I5032 Chronic diastolic (congestive) heart failure: Secondary | ICD-10-CM

## 2019-05-15 DIAGNOSIS — I639 Cerebral infarction, unspecified: Secondary | ICD-10-CM

## 2019-05-15 DIAGNOSIS — M5416 Radiculopathy, lumbar region: Secondary | ICD-10-CM

## 2019-05-15 DIAGNOSIS — Z515 Encounter for palliative care: Secondary | ICD-10-CM

## 2019-05-15 NOTE — Progress Notes (Signed)
[place] Consult Note Telephone: [phone]  Fax: [phone]  PATIENT NAME: Ryan [name] DOB: 22-Feb-1931 MRN: [id number]  PRIMARY CARE PROVIDER:   Roetta Sessions, NP  REFERRING PROVIDER:  Roetta Sessions, NP [address] [place],  [place] [address]  RESPONSIBLE PARTY:Ryan [name], wife [phone]  TELEHEALTH VISIT STATEMENT Due to the COVID-19 crisis, this visit was done via telephone from my office. It was initiated and consented to by this patient and/or family.   Advance Care Planning/Goals of Care: Patient is a DNR. MOST form selections include limited additional intervention, IV fluids as indicated, Antibiotics as indicated and tube feeding for a trial period.  Spouse called with report of patient's complaint of pain, unrelieved by Tylenol and Aleve. She said patient no longer taking Tramadol and PCP not willing to prescribe any narcotic for patient. She said PCP was to set up patient for pain management clinic but this has not yet materialized. She discouraged this NP calling patient who ishe said s currently resting in bed and that patient is hard of hearing- 'no need to call him'. She said she was distressed not being able to get a hold of PCP. Therapeutic listening and ample emotional support provided. She requested to be called back with any solution or development. NP called and spoke with Ryan [name] at [facility] office. PCP - Ryan [name] - called back and expressed her reluctance to precscibe narcotic for patient because of his history of taking different medications including narcotic in a manner that is not prescribed and safe. This NP acknowledged her concern, adding that patient has Hydrocodone from many years ago. PCP said she is aware of this.  Recommendation:  Tizanidine 38m BID, may go up if indicated, Voltaren gel. PCP receptive of recommendations. She will send the prescription to  patient's pharmacy and set him up with a pain management clinic immediately. She expressed appreciation for the call. This NP called Ryan Sidlesand updated her via voicemail; also call back number if with any further concerns. Ryan Sidlescalled back at 5.35pm  and details of communication with PCP discussed with her and the new meds PCP will send in for patient, as well as pain management clinic set up. She expressed appreciation.  Follow uDI:YMEBRAXENMcare will continue to follow patient for goals of care clarification and symptom management. I spent one hour and 16 minutes providing this consultation; time includes chart review and documentation. More than 50% of the time in this consultation was spent coordinating communication.  HISTORY OF PRESENT ILLNESS:Ryan [name] a 86y.o.year oldmalewith multiple medical problems includingrecent CVA,CHF, COPD, pulmonary fibrosis, HTN, non hodgkin's lymphoma status post radiation. Palliative Care was asked to help address goals of care. CODE STATUS: DNR  PPS: 50% HOSPICE ELIGIBILITY/DIAGNOSIS: TBD  PAST MEDICAL HISTORY:  Past Medical History:  Diagnosis Date  . Asthma   . Barrett's esophagus   . Benign prostatic hypertrophy   . Bilateral lower extremity edema   . CHF (congestive heart failure) ([place]   . Chronic fatigue   . COPD (chronic obstructive pulmonary disease) ([place]   . Diabetes mellitus type 2 in nonobese ([place] 04/24/2019  . Diverticulosis of colon (without mention of hemorrhage)   . Esophageal reflux   . Gastritis   . Gluten intolerance   . Hiatal hernia 02/10/11   Noted on CT Scan - Moderate Hiatal Hernia  . Hyperlipemia   . Hypertension   . Lymphoma of lymph nodes in  pelvis ([place]) 03/03/2011    Large Right Retroperitoneal Mass  . Melanoma ([place])    lymphoma  . Microcytic anemia 04/20/2011   . 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 Osteoarthritis    hands/feet,knees, NECK,  BACK  . Periaortic lymphadenopathy 02/16/2011  . Personal history of colonic polyps 2004   hyperplastic Dr. Collene Mares  . Pulmonary hyperinflation   . Renal cyst 02/10/11    Noted on CT Scan - Bilateral Renal Cysts  . S/P radiation therapy 03/15/11 - 04/09/11   Abdominal/ Pelvic Tumor, 3600 cGy/20 Fractions    SOCIAL HX:  Social History   Tobacco Use  . Smoking status: Former Smoker    Packs/day: 1.00    Years: 35.00    Pack years: 35.00    Types: Pipe, Cigarettes    Quit date: 02/23/1992    Years since quitting: 27.2  . Smokeless tobacco: Never Used  . Tobacco comment: quit 20 years ago  Substance Use Topics  . Alcohol use: Yes    Comment: 2 drinks daily scotch  AND WINE WITH SUPPER    ALLERGIES:  Allergies  Allergen Reactions  . Pollen Extract-Tree Extract Other (See Comments)    HEADACHES, TIRED , DRAINAGE FROM SINUSES  . Molds & Smuts Other (See Comments)    Also dust mites causes sinus infections, h/a etc.     PERTINENT MEDICATIONS:  Outpatient Encounter Medications as of 05/15/2019  Medication Sig  . allopurinol (ZYLOPRIM) 100 MG tablet Take 50 mg by mouth daily.  Marland Kitchen aspirin 325 MG tablet Take 1 tablet (325 mg total) by mouth daily.  Marland Kitchen atorvastatin (LIPITOR) 40 MG tablet Take 1 tablet (40 mg total) by mouth daily at 6 PM.  . diltiazem (CARDIZEM CD) 120 MG 24 hr capsule Take 1 capsule (120 mg total) by mouth daily.  Marland Kitchen escitalopram (LEXAPRO) 10 MG tablet Take 10 mg by mouth daily.  . finasteride (PROSCAR) 5 MG tablet Take 1 tablet (5 mg total) by mouth daily. (Patient taking differently: Take 5 mg by mouth at bedtime. )  . FLONASE SENSIMIST 27.5 MCG/SPRAY nasal spray Place 1 spray into the nose daily as needed for rhinitis or allergies.   Marland Kitchen ipratropium (ATROVENT) 0.03 % nasal spray Place 2 sprays into both nostrils 2 (two) times daily.  Marland Kitchen latanoprost (XALATAN) 0.005 % ophthalmic solution Place 1 drop into both eyes at bedtime.   . methocarbamol (ROBAXIN) 500 MG tablet Take  500-1,000 mg by mouth every 8 (eight) hours as needed for muscle spasms.  Marland Kitchen omeprazole (PRILOSEC) 20 MG capsule Take 20 mg by mouth daily.  . potassium chloride (KLOR-CON) 10 MEQ tablet Take 10 mEq by mouth daily.  . Riociguat (ADEMPAS) 1.5 MG TABS Take 1.5 mg by mouth 3 (three) times daily.  Marland Kitchen rOPINIRole (REQUIP) 2 MG tablet Take 1 tablet (2 mg total) by mouth in the morning, at noon, and at bedtime.  . Selexipag 1600 MCG TABS Take 1.6 mcg by mouth 2 (two) times daily. UPTRAVI  . spironolactone (ALDACTONE) 25 MG tablet Take 1 tablet (25 mg total) by mouth daily.  Marland Kitchen torsemide (DEMADEX) 20 MG tablet Take 3 tablets (60 mg total) by mouth 2 (two) times daily.  . traMADol (ULTRAM) 50 MG tablet Take 50 mg by mouth every 6 (six) hours as needed for moderate pain.   . traZODone (DESYREL) 100 MG tablet Take 100 mg by mouth at bedtime.   No facility-administered encounter medications on  file as of 05/15/2019.    Teodoro Spray, NP

## 2019-05-16 ENCOUNTER — Other Ambulatory Visit: Payer: Self-pay | Admitting: *Deleted

## 2019-05-16 ENCOUNTER — Encounter (HOSPITAL_COMMUNITY): Payer: Self-pay | Admitting: Cardiology

## 2019-05-16 ENCOUNTER — Telehealth (HOSPITAL_COMMUNITY): Payer: Self-pay

## 2019-05-16 ENCOUNTER — Ambulatory Visit (HOSPITAL_COMMUNITY)
Admission: RE | Admit: 2019-05-16 | Discharge: 2019-05-16 | Disposition: A | Discharge status: Home / Self Care | Payer: Medicare HMO | Source: Ambulatory Visit | Attending: Cardiology | Admitting: Cardiology

## 2019-05-16 ENCOUNTER — Other Ambulatory Visit: Payer: Self-pay

## 2019-05-16 ENCOUNTER — Other Ambulatory Visit: Payer: Medicare HMO

## 2019-05-16 VITALS — BP 100/70 | HR 80 | Wt 193.2 lb

## 2019-05-16 DIAGNOSIS — I480 Paroxysmal atrial fibrillation: Secondary | ICD-10-CM | POA: Diagnosis not present

## 2019-05-16 DIAGNOSIS — R0602 Shortness of breath: Secondary | ICD-10-CM | POA: Diagnosis present

## 2019-05-16 DIAGNOSIS — I130 Hypertensive heart and chronic kidney disease with heart failure and stage 1 through stage 4 chronic kidney disease, or unspecified chronic kidney disease: Secondary | ICD-10-CM | POA: Diagnosis not present

## 2019-05-16 DIAGNOSIS — Z7901 Long term (current) use of anticoagulants: Secondary | ICD-10-CM | POA: Diagnosis not present

## 2019-05-16 DIAGNOSIS — J8410 Pulmonary fibrosis, unspecified: Secondary | ICD-10-CM | POA: Insufficient documentation

## 2019-05-16 DIAGNOSIS — K219 Gastro-esophageal reflux disease without esophagitis: Secondary | ICD-10-CM | POA: Insufficient documentation

## 2019-05-16 DIAGNOSIS — I5033 Acute on chronic diastolic (congestive) heart failure: Secondary | ICD-10-CM

## 2019-05-16 DIAGNOSIS — Z79899 Other long term (current) drug therapy: Secondary | ICD-10-CM | POA: Insufficient documentation

## 2019-05-16 DIAGNOSIS — I2721 Secondary pulmonary arterial hypertension: Secondary | ICD-10-CM | POA: Insufficient documentation

## 2019-05-16 DIAGNOSIS — N183 Chronic kidney disease, stage 3 unspecified: Secondary | ICD-10-CM | POA: Insufficient documentation

## 2019-05-16 DIAGNOSIS — I272 Pulmonary hypertension, unspecified: Secondary | ICD-10-CM | POA: Diagnosis not present

## 2019-05-16 DIAGNOSIS — Z87891 Personal history of nicotine dependence: Secondary | ICD-10-CM | POA: Insufficient documentation

## 2019-05-16 DIAGNOSIS — G4733 Obstructive sleep apnea (adult) (pediatric): Secondary | ICD-10-CM | POA: Diagnosis not present

## 2019-05-16 DIAGNOSIS — I5032 Chronic diastolic (congestive) heart failure: Secondary | ICD-10-CM

## 2019-05-16 DIAGNOSIS — Z8249 Family history of ischemic heart disease and other diseases of the circulatory system: Secondary | ICD-10-CM | POA: Insufficient documentation

## 2019-05-16 DIAGNOSIS — Z8673 Personal history of transient ischemic attack (TIA), and cerebral infarction without residual deficits: Secondary | ICD-10-CM | POA: Diagnosis not present

## 2019-05-16 DIAGNOSIS — Z9981 Dependence on supplemental oxygen: Secondary | ICD-10-CM | POA: Insufficient documentation

## 2019-05-16 LAB — BASIC METABOLIC PANEL
Anion gap: 10 (ref 5–15)
BUN: 45 mg/dL — ABNORMAL HIGH (ref 8–23)
CO2: 32 mmol/L (ref 22–32)
Calcium: 9.3 mg/dL (ref 8.9–10.3)
Chloride: 96 mmol/L — ABNORMAL LOW (ref 98–111)
Creatinine, Ser: 1.66 mg/dL — ABNORMAL HIGH (ref 0.61–1.24)
GFR calc Af Amer: 43 mL/min — ABNORMAL LOW (ref >60)
GFR calc non Af Amer: 37 mL/min — ABNORMAL LOW (ref >60)
Glucose, Bld: 90 mg/dL (ref 70–99)
Potassium: 4.9 mmol/L (ref 3.5–5.1)
Sodium: 138 mmol/L (ref 135–145)

## 2019-05-16 LAB — BRAIN NATRIURETIC PEPTIDE: B Natriuretic Peptide: 51.5 pg/mL (ref 0.0–100.0)

## 2019-05-16 MED ORDER — APIXABAN 5 MG PO TABS: 5.0000 mg | ORAL_TABLET | Freq: Two times a day (BID) | ORAL | 5 refills | Status: DC

## 2019-05-16 MED ORDER — APIXABAN 5 MG PO TABS: 5.0000 mg | ORAL_TABLET | Freq: Two times a day (BID) | ORAL | 6 refills | Status: DC

## 2019-05-16 MED ORDER — ADEMPAS 1.5 MG PO TABS: 2.0000 mg | ORAL_TABLET | Freq: Three times a day (TID) | ORAL | 11 refills | Status: DC

## 2019-05-16 MED ORDER — TORSEMIDE 20 MG PO TABS: 60.0000 mg | ORAL_TABLET | Freq: Two times a day (BID) | ORAL | 5 refills | Status: DC

## 2019-05-16 MED ORDER — TORSEMIDE 20 MG PO TABS: ORAL_TABLET | ORAL | 5 refills | Status: DC

## 2019-05-16 NOTE — Patient Instructions (Addendum)
STOP Aspirin   START Eliquis 75m (1 tab) twice a day. We will call you to let you know when to start. This is based on your kidney function.   CONTINUE Torsemide 658m(3 tabs) twice a day   We will authorize increasing Adempas.   Labs today and repeat in 1 week We will only contact you if something comes back abnormal or we need to make some changes. Otherwise no news is good news!   Your physician recommends that you schedule a follow-up appointment in: 2 weeks with the Nurse Practitioner   Please call office at [phone] 2 if you have any questions or concerns.    At the [facility] and your health needs are our priority. As part of our continuing mission to provide you with exceptional heart care, we have created designated Provider Care Teams. These Care Teams include your primary Cardiologist (physician) and Advanced Practice Providers (APPs- Physician Assistants and Nurse Practitioners) who all work together to provide you with the care you need, when you need it.   You may see any of the following providers on your designated Care Team at your next follow up: . Marland Kitchenr DaGlori Bickers Dr DaLoralie Champagne AmDarrick GrinderNP . BrLyda JesterPA . LaAudry RilesPharmD   Please be sure to bring in all your medications bottles to every appointment.

## 2019-05-16 NOTE — Progress Notes (Signed)
PCP: Roetta Sessions, NP Cardiology: Dr. Aundra Dubin  86 y.o. with history of interstitial lung disease (?NSIP vs UIP) on home oxygen, pulmonary hypertension, diastolic CHF, and prior non-Hodgkins lymphoma (pelvic mass, s/p XRT) presents for followup of CHF and pulmonary hypertension.  Patient has history of reportedly mild ILD (NSIP vs UIP) followed by a pulmonologist at [facility] and pulmonary hypertension thought to be out of proportion lung disease followed by a physician at [name].  Last CT chest was in 11/19, showing stable ILD (?UIP vs NSIP).  He had RHC in 2/20 with moderate PAH, PVR 5.1 WU.  He was tried on Adcirca but developed cognitive problems and worsening of peripheral edema so this was stopped.  He has been on home oxygen, uses at night and with exertion.   He was admitted in 6/20 with worsening dyspnea and peripheral edema, weight up despite compliance with torsemide 40 mg bid at home.  He has CKD stage 3 and torsemide was decreased in the spring. He was diuresed aggressively with fall in weight.  Echo showed normal LV size and systolic function, RV reportedly normal.  6/20 RHC showed normal filling pressures with moderate pulmonary hypertension, PVR 5.35 WU.   After discharge from hospital, patient started on ambrisentan.  His weight steadily began to increase and he developed worsening lower extremity edema, we stopped ambrisentan.   Patient was admitted in 8/20 with AKI after taking metolazone for several days.  He was admitted again in 8/20 with acute on chronic diastolic CHF. He was diuresed.  Wandering atrial pacemaker noted.   He was admitted in 3/21 with suspected CVA, MRI head showed punctate occipital lobe infarct, thought to be due to small vessel disease. ILR was implanted.  Echo in 3/21 showed EF 60-65%, severe LVH, normal RV.  PYP scan in 3/21 was not suggestive of TTR amyloidosis.  Subsequently, ILR has shown paroxysmal atrial fibrillation.   He was admitted later in  3/21 with AKI, creatinine up to 5.  However, he corrected rapidly to baseline with IVF and creatinine was 1.02 when discharge.   He returns for followup of CHF and pulmonary hypertension.  He continues to be limited, gets short of breath dressing. He is able to walk in the halls at his facility with mild dyspnea.  Symptoms are overall stable.  No lightheadedness, syncope, or chest pain.  He has significant lower extremity edema but has not been able to tolerate compression stockings.  His main complaint is chronic low back pain that is quite severe at times. Weight is down 1 lb compared to prior appt.   REDS clip 30%  ECG (personally reviewed): NSR, PVC, iRBBB  Labs (6/20): K 4.7, creatinine 1.46 Labs (7/20): K 3.1, creatinine 1.67 Labs (8/20): K 3.9, creatinine 1.8 Labs (9/20): K 4.9, creatinine 1.76, BNP 78 Labs (10/20): K 4.2, creatininine 1.5 Labs (11/20): BNP 36, K 4.3, creatinine 1.4 Labs (3/21): myeloma panel negative, urine immunofixation negative.  Labs (4/21): K 4.5, creatinine 1.02, hgb 11.4  ECG (personally reviewed): NSR with PACs, iRBBB  PMH: 1. Non-Hodgkin's lymphoma: Pelvic involvement.  S/p radiation.  Thought to be in remission (Dr. Benay Spice).  2. HTN 3. PACs/PVCs 4. GERD: Barrett's esophagus.  5. 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  - High resolution CT chest 11/19: ILD, ?UIP pattern.  6. Pulmonary hypertension: Thought to be out of proportion to underlying lung disease (ILD, ?NSIP vs UIP).   - RHC (2/20): mean RA 10, PASP 60 with mean 42, mean PCWP  11, CI 3, PVR 5.1 WU.  - Trial of Adcirca => failed due to ?memory worsening/cognitive affects and peripheral edema.  - Echo (6/20): EF 60-65%, mild LVH, RV reportedly normal, PASP 59 mmHg.  - RHC (6/20): mean RA 7, PA 50/24 mean 34, mean PCWP 12, CI 2.01, PAPI 3.7, PVR 5.35 WU - RF negative, ANA negative, anti-SCL70 negative, anti-centromere negative.  - V/Q scan (8/20): No evidence for chronic PE.  - Echo (3/21): EF  60-65%, severe LVH, normal RV.  7. CKD: Stage 3.   8. Wandering atrial pacemaker 9. OSA 10. PYP scan (3/21): H/CL ratio 1.0, grade 0.  11. CVA: MRI 3/21 with punctate occipital lobe infarct thought to be due to small vessel disease. - Carotid dopplers (3/21): 40-59% RICA stenosis.  12. Atrial fibrillation: Paroxysmal, noted on ILR.   Social History   Socioeconomic History  . Marital status: Married    Spouse name: Not on file  . Number of children: 2  . Years of education: Not on file  . Highest education level: Not on file  Occupational History  . 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 Smoking status: Former Smoker    Packs/day: 1.00    Years: 35.00    Pack years: 35.00    Types: Pipe, Cigarettes    Quit date: 02/23/1992    Years since quitting: 27.2  . Smokeless tobacco: Never Used  . Tobacco comment: quit 20 years ago  Substance and Sexual Activity  . Alcohol use: Yes    Comment: 2 drinks daily scotch  AND WINE WITH SUPPER  . Drug use: No  . Sexual activity: Not on file  Other Topics Concern  . Not on file  Social History Narrative   Married - second marriage   He has two children   Retired Horticulturist, commercial Professor   Currently teaches pottery making, 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[facility]   Former Smoker quit 12 years ago- smoked for 35 years   Alcohol use- yes   Social Determinants of Radio broadcast assistant Strain:   . Difficulty of Paying Living Expenses:   Food Insecurity:   . Worried About Charity fundraiser in the Last Year:   . Arboriculturist in the Last Year:   Transportation Needs:   . Film/video editor (Medical):   Marland Kitchen Lack of Transportation (Non-Medical):   Physical Activity:   . Days of Exercise per Week:   . Minutes of Exercise per Session:   Stress:   . Feeling of Stress :   Social Connections:   . Frequency of Communication with Friends and Family:   . Frequency of Social Gatherings with Friends  and Family:   . Attends Religious Services:   . Active Member of Clubs or Organizations:   . Attends Archivist Meetings:   Marland Kitchen Marital Status:   Intimate Partner Violence:   . Fear of Current or Ex-Partner:   . Emotionally Abused:   Marland Kitchen Physically Abused:   . Sexually Abused:    Family History  Problem Relation Age of Onset  . Heart disease Father        MI 26  . Urticaria Father   . Prostate cancer Brother   . Prostate cancer Paternal Uncle   . Prostate cancer Paternal Uncle   . Prostate cancer Paternal Uncle   . Hyperlipidemia Other   . Stroke Other   . Hypertension Other   .  ADD / ADHD Other   . Colon cancer Neg Hx   . Allergic rhinitis Neg Hx   . Asthma Neg Hx   . Eczema Neg Hx    ROS: All systems reviewed and negative except as per HPI.   Current Outpatient Medications  Medication Sig Dispense Refill  . allopurinol (ZYLOPRIM) 100 MG tablet Take 50 mg by mouth daily.    Marland Kitchen atorvastatin (LIPITOR) 40 MG tablet Take 1 tablet (40 mg total) by mouth daily at 6 PM. 30 tablet 1  . diltiazem (CARDIZEM CD) 120 MG 24 hr capsule Take 1 capsule (120 mg total) by mouth daily. 90 capsule 1  . escitalopram (LEXAPRO) 10 MG tablet Take 10 mg by mouth daily.    . finasteride (PROSCAR) 5 MG tablet Take 1 tablet (5 mg total) by mouth daily. (Patient taking differently: Take 5 mg by mouth at bedtime. ) 30 tablet 5  . FLONASE SENSIMIST 27.5 MCG/SPRAY nasal spray Place 1 spray into the nose daily as needed for rhinitis or allergies.     Marland Kitchen ipratropium (ATROVENT) 0.03 % nasal spray Place 2 sprays into both nostrils 2 (two) times daily.    Marland Kitchen latanoprost (XALATAN) 0.005 % ophthalmic solution Place 1 drop into both eyes at bedtime.     . methocarbamol (ROBAXIN) 500 MG tablet Take 500-1,000 mg by mouth every 8 (eight) hours as needed for muscle spasms.    Marland Kitchen omeprazole (PRILOSEC) 20 MG capsule Take 20 mg by mouth daily.    . potassium chloride (KLOR-CON) 10 MEQ tablet Take 10 mEq by mouth  daily.    Marland Kitchen rOPINIRole (REQUIP) 2 MG tablet Take 1 tablet (2 mg total) by mouth in the morning, at noon, and at bedtime. 90 tablet 3  . Selexipag 1600 MCG TABS Take 1.6 mcg by mouth 2 (two) times daily. UPTRAVI    . torsemide (DEMADEX) 20 MG tablet Take 3 tablets (60 mg total) by mouth 2 (two) times daily. 60 tablet 5  . traMADol (ULTRAM) 50 MG tablet Take 50 mg by mouth every 6 (six) hours as needed for moderate pain.     . traZODone (DESYREL) 100 MG tablet Take 100 mg by mouth at bedtime.    Marland Kitchen apixaban (ELIQUIS) 5 MG TABS tablet Take 1 tablet (5 mg total) by mouth 2 (two) times daily. 60 tablet 6  . Riociguat (ADEMPAS) 1.5 MG TABS Take 2 mg by mouth 3 (three) times daily. 90 tablet 11  . spironolactone (ALDACTONE) 25 MG tablet Take 1 tablet (25 mg total) by mouth daily. 30 tablet 0   No current facility-administered medications for this encounter.   BP 100/70   Pulse 80   Wt 87.6 kg (193 lb 3.2 oz)   SpO2 92%   BMI 30.26 kg/m  General: NAD Neck: JVP 7-8 cm, no thyromegaly or thyroid nodule.  Lungs: Clear to auscultation bilaterally with normal respiratory effort. CV: Nondisplaced PMI.  Heart regular S1/S2, no S3/S4, no murmur.  2+ edema to knees.  No carotid bruit.  Normal pedal pulses.  Abdomen: Soft, nontender, no hepatosplenomegaly, no distention.  Skin: Intact without lesions or rashes.  Neurologic: Alert and oriented x 3.  Psych: Normal affect. Extremities: No clubbing or cyanosis.  HEENT: Normal.   Assessment/Plan: 1. Chronic diastolic CHF: Suspect with significant component of RV failure from pulmonary hypertension. Echo in 6/20 with EF 60-65%, RV not well-visualized but PASP 59 mmHg.  Echo (3/21) with EF 60-65%, severe LVH, normal RV, PASP 28 mmHg.  He has significant peripheral edema but no JVD and REDS clip is 30%.  Suspect significant venous insufficiency but well-controlled CHF.  NYHA class III symptoms chronically.      - Continue torsemide 60 mg bid. He is not taking  metolazone.   - Continue spironolactone 25 mg daily.  - He is unable to wear compression stockings, keep legs elevated.  2. CKD Stage 3:  Recent AKI.  Check BMET today.  3. Pulmonary hypertnesion: Patient thought to have [place] out of proportion to his lung disease (ILD, NSIP versus UIP). RHC in 2/20 showed mean PA pressure 41, PVR 5.1 WU, CI 3. Echo in 6/20 with RV reportedly ok =>I reviewed and do not think that RV was well-visualized. Patient did not tolerate Adcirca due to edema/?cognitive effects.  RHC was done again in 6/20, showed moderate pulmonary arterial hypertension with PVR 5.35 (comparable to prior).  Serological workup for PH was negative. Patient started ambrisentan 5 mg and developed significant edema/volume overload despite a high dose of torsemide. He is now on Uptravi + riociguat.  Most recent echo in 3/21 showed normal RV size/systolic function and PA systolic pressure estimate was normal.    - he will stay off ambrisentan (?trigger for volume overload).  - As above, he did not tolerate tadalafil.  - Continue Uptravi, now up to 1600 mcg bid.  - Continue to titrate up riociguat. - 6 minute walk next appt.  4. Atrial fibrillation: Paroxysmal, noted on ILR. NSR today.  - Stop ASA and start apixaban 5 mg bid.  5. OSA: Waiting on CPAP.  6. CVA: In 3/21, may have been atrial fibrillation-related.  Starting apixaban.  7. Carotid stenosis: 40-59% RICA stenosis on 3/21 dopplers.  - Repeat carotid dopplers in 3/22.   Followup with NP/PA in 2 wks to reassess volume status.   Loralie Champagne 05/16/2019

## 2019-05-16 NOTE — Patient Outreach (Signed)
[facility]) Care Management  05/16/2019  KAESYN JOHNSTON [birth year] [id number]   Subjective:Telephone call to patient's home  / mobile number, no answer, left HIPAA compliant voicemail message, and requested call back.    Objective:Per KPN (Knowledge Performance Now, point of care tool) and chart review,patient hospitalized 04/30/2019 - 05/04/2019 forAKI onChronic Kidney Disease Stage4 with azotemia. Patient hospitalized 04/24/2019 - 04/30/2019 for Acute CVA (cerebrovascular accident). Patient also has a history of Non Hodgkin's lymphomapelvic massstatus post radiation treatment, diabetes, hypertension,Acute on chronic diastolic CHF (congestive heart failure),Pulmonary hypertension,interstitial lung disease on home oxygen,anemia,Asthma,Barrett's esophagus,Benign prostatic hypertrophy,Bilateral lower extremity edema,COPD (chronic obstructive pulmonary disease),Diverticulosis of colon,Gluten intolerance,Hyperlipemia,Hiatal hernia,Melanoma,Osteoarthritis,Periaortic lymphadenopathy, andParoxysmal AtrialFibrillation.      Assessment: Received Bernadene Person EMMI Stroke Red Flag Alert follow up referral on 04/30/2019. Red Flag Alert Triggers, Day # 1, times 3, patient answered yes to the following question: Feeling worse overall?Patient answered no to the following questions:Able to eat and drink?Scheduled a follow-up appointment?[facility] EMMI follow up pending patient contact.     Plan:RNCM has send unsuccessful outreach letter, [facility] pamphlet, handout: Know Before You Go, will  proceed with case closure, within 10 business days if no return callafter 3rd unsuccessful outreach call.     [place] H. Annia Friendly, BSN, [place] Management [facility] Telephonic CM Phone: [phone] Fax: [phone]

## 2019-05-16 NOTE — Telephone Encounter (Signed)
Patient was seen for a wound check while he was in the HF center to see Dr. Aundra Dubin.  Due for loop wound check Implanted 04/26/2019 for cryptogenic stroke The dressings were removed without difficulty.  The site is well healed, no erythema edema or increased heat to the surrounding tissues. Is well healed, no signs of infection  We have gotten ILR transmissions to the office.  He has his transmitter in his bedroom, about 10 feet from his bed, asked to move closer if he can to about 6 feet is best.  Will continue monthly monitoring

## 2019-05-16 NOTE — Telephone Encounter (Signed)
Pt in office this morning with his ex wife. They discussed increasing dose of adempas. Per Dr Aundra Dubin, ok to titrate up.  Pt currently on 1.29m TID. Pt will increase to 253mTID.  Called [facility] to authorize increase. They will call patient and/or wife to discuss shipment.

## 2019-05-17 ENCOUNTER — Telehealth (HOSPITAL_COMMUNITY): Payer: Self-pay

## 2019-05-17 MED ORDER — APIXABAN 2.5 MG PO TABS: 2.5000 mg | ORAL_TABLET | Freq: Two times a day (BID) | ORAL | 5 refills | Status: DC

## 2019-05-17 NOTE — Telephone Encounter (Signed)
-----  Message from Larey Dresser, MD sent at 05/16/2019 11:27 PM EDT ----- He should take apixaban 2.5 mg bid (not 5 mg bid) based on age and creatinine.

## 2019-05-17 NOTE — Telephone Encounter (Signed)
Called [facility] RN Briona to discuss medication changes. Advised patient will stop aspirin, and start eliquis 2.58m (1 tab) twice a day and adempas increased to 255mTID.  Medication list faxed to her at [phone]

## 2019-05-24 ENCOUNTER — Other Ambulatory Visit (HOSPITAL_COMMUNITY): Payer: Medicare HMO

## 2019-05-24 ENCOUNTER — Ambulatory Visit (HOSPITAL_COMMUNITY)
Admission: RE | Admit: 2019-05-24 | Discharge: 2019-05-24 | Disposition: A | Discharge status: Home / Self Care | Payer: Medicare HMO | Source: Ambulatory Visit | Attending: Internal Medicine | Admitting: Internal Medicine

## 2019-05-24 ENCOUNTER — Other Ambulatory Visit: Payer: Self-pay

## 2019-05-24 DIAGNOSIS — I5033 Acute on chronic diastolic (congestive) heart failure: Secondary | ICD-10-CM | POA: Insufficient documentation

## 2019-05-24 LAB — BASIC METABOLIC PANEL
Anion gap: 14 (ref 5–15)
BUN: 55 mg/dL — ABNORMAL HIGH (ref 8–23)
CO2: 29 mmol/L (ref 22–32)
Calcium: 8.3 mg/dL — ABNORMAL LOW (ref 8.9–10.3)
Chloride: 97 mmol/L — ABNORMAL LOW (ref 98–111)
Creatinine, Ser: 1.83 mg/dL — ABNORMAL HIGH (ref 0.61–1.24)
GFR calc Af Amer: 38 mL/min — ABNORMAL LOW (ref >60)
GFR calc non Af Amer: 33 mL/min — ABNORMAL LOW (ref >60)
Glucose, Bld: 100 mg/dL — ABNORMAL HIGH (ref 70–99)
Potassium: 4 mmol/L (ref 3.5–5.1)
Sodium: 140 mmol/L (ref 135–145)

## 2019-05-28 ENCOUNTER — Ambulatory Visit (INDEPENDENT_AMBULATORY_CARE_PROVIDER_SITE_OTHER): Payer: Medicare HMO | Admitting: *Deleted

## 2019-05-28 ENCOUNTER — Ambulatory Visit: Payer: Medicare HMO | Admitting: Neurology

## 2019-05-28 ENCOUNTER — Telehealth (HOSPITAL_COMMUNITY): Payer: Self-pay | Admitting: Pharmacist

## 2019-05-28 ENCOUNTER — Other Ambulatory Visit: Payer: Self-pay

## 2019-05-28 ENCOUNTER — Encounter: Payer: Self-pay | Admitting: Neurology

## 2019-05-28 VITALS — BP 109/62 | HR 89 | Temp 97.9°F | Ht 67.0 in | Wt 194.0 lb

## 2019-05-28 DIAGNOSIS — E1142 Type 2 diabetes mellitus with diabetic polyneuropathy: Secondary | ICD-10-CM | POA: Diagnosis not present

## 2019-05-28 DIAGNOSIS — G8929 Other chronic pain: Secondary | ICD-10-CM

## 2019-05-28 DIAGNOSIS — I6310 Cerebral infarction due to embolism of unspecified precerebral artery: Secondary | ICD-10-CM | POA: Diagnosis not present

## 2019-05-28 DIAGNOSIS — M5441 Lumbago with sciatica, right side: Secondary | ICD-10-CM

## 2019-05-28 DIAGNOSIS — M5442 Lumbago with sciatica, left side: Secondary | ICD-10-CM | POA: Diagnosis not present

## 2019-05-28 DIAGNOSIS — E538 Deficiency of other specified B group vitamins: Secondary | ICD-10-CM | POA: Diagnosis not present

## 2019-05-28 DIAGNOSIS — Z950 Presence of cardiac pacemaker: Secondary | ICD-10-CM

## 2019-05-28 MED ORDER — CYANOCOBALAMIN 1000 MCG/ML IJ SOLN
1000.0000 ug | Freq: Once | INTRAMUSCULAR | Status: AC
  Administered 2019-05-28: 1000 ug via INTRAMUSCULAR

## 2019-05-28 NOTE — Progress Notes (Signed)
Pt given B12 injection 1000 mcg IM in L deltoid per order from Dr. Jaynee Eagles. Pt tolerated well. See MAR.

## 2019-05-28 NOTE — Telephone Encounter (Signed)
Obtained [employer] [facility]pert[facility] Fatima Sanger to help with copay assistance for Uptravi and Adempas. This is a Programmer, applications. Will continue to use [employer] grant until it runs out.   Member ID: [id number] Group ID: [id number] RxBin ID: [id number] PCN: [id number] Eligibility Start Date: 02/27/2019 Eligibility End Date: 05/26/2020 Assistance Amount: $6,500.00  Audry Riles, PharmD, BCPS, BCCP, CPP Heart Failure Clinic Pharmacist [phone]

## 2019-05-28 NOTE — Progress Notes (Signed)
[facility]    Provider:  Dr Jaynee Eagles Requesting Provider: Roetta Sessions* Primary Care Provider:  Roetta Sessions, NP  CC:  Stroke  HPI:  Ryan [name] is a 86 y.o. male here as requested by Roetta Sessions* for follow up after hospitalization. PMHx congestive heart failure, pulmonary HTN,  COPD on continuous oxygen, non-Hodgkin's lymphoma with a pelvis mass status post radiation therapy, diabetes mellitus, chronic kidney disease stage IV presenting with swollen legs, low back pain and restless legs, recently hospitalized for incidental right occipital infarct likely small vessel disease.   -  I reviewed notes from [facility]: Patient presented with myoclonus and stuttering likely due to anxiety and stress, random and asynchronous jerking of all extremities, symptoms happened in the setting of discomfort, upset, back pain, severe anxiety, EEG in the hospital was negative and it was recommended that he follow-up with relaxation, distress and therapy for anxiety. -I also reviewed notes from Dr. Jannifer Franklin who last saw patient in February of this year, he has diabetic neuropathy, he had been on Mirapex previously for his restless legs recently switched to Requip, and he also has low back pain is severe enough that it is a limiting factor with his ability to walk and he may use a walker at times with radicular symptoms.  Patient is here alone. He was changed to Eliquis, the loop recorder found atrial fibrillation. He says pain management won't "have me", he is also on Eliquis for new afib seen on his loop, he is on hospice now and he says they will providing pain management and he wants opioids, he trued pain management, injections don;t work.He has a physical therapist who is helping him, he has used dry needles and massage and that helps tremendously with the back pain. We also discussed stroke due to afib and risks of, he takes the ropinerole but will stop  when he starts opioids with hospice. He feels he is doing well, no more jerking or stuttering, his feet hurt, he has severe low back pain and is having back spasms. Otherwise he feels better overall. He was b12 deficient in the past and had to take injections, hasn;t taken injections in a long time in over 10 years. His breathing slows down when sleeping, he wakes up to urinate hourly, he has had sleep eva;uation and he needs a cpap I recommended he follow up with them.   Reviewed notes, labs and imaging from outside physicians, which showed:  May 03, 2019: Ferritin was 70, BMP unremarkable BUN 21 and creatinine 1.0.  B12 192.  Magnesium normal 2.1, folate normal, March 2021: IFE/SPEP normal, hemoglobin A1c 5.9 (8 months prior was 8.6),   Reviewed MRi of the brain images and agree with the following: IMPRESSION: 1. Punctate diffusion abnormality in the right occipital lobe white matter compatible with a tiny acute infarct. However, clinical significance is unclear. There is no associated hemorrhage or mass effect.  2. No other acute intracranial abnormality identified. Generally mild for age signal changes elsewhere in the brain compatible with chronic small vessel disease.  MRA head: Negative intracranial MRA for large vessel occlusion. No proximal high-grade or correctable stenosis identified  I reviewed MRI of the lumbar spine April 16, 2019 as below:  MRI lumbar spine (without) demonstrating: - Progressive degenerative spondylosis from L1-L5. Rotoscoliosis convex right centered at L2-3.   - At L3-4: disc bulging and facet hypertrophy with severe biforaminal stenosis. - At L4-5: disc bulging and facet hypertrophy with  severe right foraminal stenosis. - At L5-S1: disc bulging and facet hypertrophy with severe right foraminal stenosis; right laminectomy noted. - At L2-3: disc bulging and facet hypertrophy with moderate left foraminal stenosis and left lateral recess stenosis.  Review  of Systems: Patient complains of symptoms per HPI as well as the following symptoms: chronic LBP, stroke, numbness and swelling in legs,chronic pain. Pertinent negatives and positives per HPI. All others negative.   Social History   Socioeconomic History  . Marital status: Married    Spouse name: Not on file  . Number of children: 2  . Years of education: Not on file  . Highest education level: Not on file  Occupational History  . 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 Smoking status: Former Smoker    Packs/day: 1.00    Years: 35.00    Pack years: 35.00    Types: Pipe, Cigarettes    Quit date: 02/23/1992    Years since quitting: 27.2  . Smokeless tobacco: Never Used  . Tobacco comment: quit 20 years ago  Substance and Sexual Activity  . Alcohol use: Not Currently    Comment: 2 drinks daily scotch  AND WINE WITH SUPPER  . Drug use: No  . Sexual activity: Not on file  Other Topics Concern  . Not on file  Social History Narrative   Married - second marriage   He has two children   Retired Horticulturist, commercial Professor   Currently teaches pottery making, 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[facility]   Former Smoker quit 12 years ago- smoked for 35 years   Alcohol use- not currently   Right handed   Social Determinants of Radio broadcast assistant Strain:   . Difficulty of Paying Living Expenses:   Food Insecurity:   . Worried About Charity fundraiser in the Last Year:   . Arboriculturist in the Last Year:   Transportation Needs:   . Film/video editor (Medical):   Marland Kitchen Lack of Transportation (Non-Medical):   Physical Activity:   . Days of Exercise per Week:   . Minutes of Exercise per Session:   Stress:   . Feeling of Stress :   Social Connections:   . Frequency of Communication with Friends and Family:   . Frequency of Social Gatherings with Friends and Family:   . Attends Religious Services:   . Active Member of Clubs or Organizations:    . Attends Archivist Meetings:   Marland Kitchen Marital Status:   Intimate Partner Violence:   . Fear of Current or Ex-Partner:   . Emotionally Abused:   Marland Kitchen Physically Abused:   . Sexually Abused:     Family History  Problem Relation Age of Onset  . Heart disease Father        MI 41  . Urticaria Father   . Prostate cancer Brother   . Prostate cancer Paternal Uncle   . Prostate cancer Paternal Uncle   . Prostate cancer Paternal Uncle   . Hyperlipidemia Other   . Stroke Other   . Hypertension Other   . ADD / ADHD Other   . Colon cancer Neg Hx   . Allergic rhinitis Neg Hx   . Asthma Neg Hx   . Eczema Neg Hx     Past Medical History:  Diagnosis Date  . Asthma   . Barrett's esophagus   . Benign prostatic hypertrophy   . Bilateral  lower extremity edema   . CHF (congestive heart failure) ([place])   . Chronic fatigue   . COPD (chronic obstructive pulmonary disease) ([place])   . Diabetes mellitus type 2 in nonobese ([place]) 04/24/2019  . Diverticulosis of colon (without mention of hemorrhage)   . Esophageal reflux   . Gastritis   . Gluten intolerance   . Hiatal hernia 02/10/11   Noted on CT Scan - Moderate Hiatal Hernia  . Hyperlipemia   . Hypertension   . Lymphoma of lymph nodes in pelvis ([place]) 03/03/2011    Large Right Retroperitoneal Mass  . Melanoma ([place])    lymphoma  . Microcytic anemia 04/20/2011   . 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 Osteoarthritis    hands/feet,knees, NECK, BACK  . Periaortic lymphadenopathy 02/16/2011  . Personal history of colonic polyps 2004   hyperplastic Dr. Collene Mares  . Pulmonary hyperinflation   . Renal cyst 02/10/11    Noted on CT Scan - Bilateral Renal Cysts  . S/P radiation therapy 03/15/11 - 04/09/11   Abdominal/ Pelvic Tumor, 3600 cGy/20 Fractions    Patient Active Problem List   Diagnosis Date Noted  . B12 deficiency 05/28/2019  . Cerebrovascular accident (CVA) due to embolism of  precerebral artery ([place]) 05/28/2019  . Diabetic polyneuropathy associated with type 2 diabetes mellitus ([place]) 05/28/2019  . Acute CVA (cerebrovascular accident) ([place]) 04/24/2019  . Diabetes mellitus type 2 in nonobese ([place]) 04/24/2019  . Acute respiratory failure with hypoxia and hypercarbia ([place]) 09/20/2018  . CKD (chronic kidney disease) stage 3, GFR 30-59 ml/min 09/20/2018  . Lethargy 09/20/2018  . Acute metabolic encephalopathy 97/28/2060  . CHF (congestive heart failure) ([place]) 09/20/2018  . AKI (acute kidney injury) ([place]) 09/10/2018  . Pulmonary hypertension, unspecified ([place]) 09/10/2018  . Multifocal atrial tachycardia ([place]) 09/10/2018  . CHF exacerbation ([place]) 09/07/2018  . Leg pain 09/07/2018  . Hyperkalemia 07/26/2018  . Acute on chronic diastolic CHF (congestive heart failure) ([place])   . Bilateral lower extremity edema 02/02/2017  . Pulmonary fibrosis ([place]) 11/22/2016  . Blurry vision, bilateral 10/26/2016  . COPD ([place]) 07/05/2016  . Exertional dyspnea 09/10/2015  . Fatigue/malaise 08/19/2015  . Asthma/COPD 02/12/2015  . 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 Hypoxemia 01/05/2014  . Essential hypertension 01/05/2014  . Hypokalemia 01/05/2014  . Urinary retention 01/05/2014  . Hypoxia   . Lumbar radiculopathy 12/31/2013  . RLS (restless legs syndrome) 09/21/2012  . Chronic bilateral low back pain with bilateral sciatica 06/15/2012  . Abdominal pain, unspecified site 05/19/2012  . Unspecified deficiency anemia 04/07/2012  . Melanoma ([place])   . Non Hodgkin's lymphoma ([place])   . Periaortic lymphadenopathy 02/16/2011  . Barrett's esophagus 07/09/2010  . Personal history of colonic polyps 05/28/2010  . History of IBS 05/28/2010  . Diverticulosis of colon (without mention of hemorrhage) 05/28/2010  . Arthritis involving multiple sites 05/28/2010  . Wheat intolerance 05/28/2010  . Chronic venous insufficiency 08/19/2009  . Peripheral edema 08/19/2009  . INSOMNIA,  CHRONIC 12/24/2008  . EUSTACHIAN TUBE DYSFUNCTION, BILATERAL 12/23/2008  . ERECTILE DYSFUNCTION, ORGANIC 08/01/2008  . Allergic rhinitis with a nonallergic component 05/20/2008  . VENTRAL HERNIA 02/12/2008  . PALPITATIONS 12/25/2007  . LACTOSE INTOLERANCE 10/17/2007  . Hyperlipidemia 09/14/2006  . BPH (benign prostatic hyperplasia) 09/14/2006  . OSTEOARTHRITIS 09/14/2006  . HEART MURMUR, HX OF 09/14/2006    Past Surgical History:  Procedure Laterality Date  . ARTHROSCOPIC REPAIR ACL     right  .  BONE MARROW ASPIRATION  02/25/11   Bone Marrow, Aspirate, Clot, and Bilateral Bx, Right PIC  . CATARACT EXTRACTION, BILATERAL    . EYE SURGERY    . HERNIA REPAIR     LEFT INGUINAL   . INSERTION OF MESH N/A 08/23/2012   Procedure: INSERTION OF MESH;  Surgeon: Madilyn Hook, DO;  Location: [facility];  Service: General;  Laterality: N/A;  . LOOP RECORDER INSERTION N/A 04/26/2019   Procedure: LOOP RECORDER INSERTION;  Surgeon: Evans Lance, MD;  Location: [facility];  Service: Cardiovascular;  Laterality: N/A;  . LUMBAR LAMINECTOMY/DECOMPRESSION MICRODISCECTOMY Right 12/31/2013   Procedure: RIGHT L4-5 L5-S1 LAMINECTOMY;  Surgeon: Kristeen Miss, MD;  Location: [facility];  Service: Neurosurgery;  Laterality: Right;  RIGHT L4-5 L5-S1 LAMINECTOMY  . RIGHT HEART CATH N/A 07/25/2018   Procedure: RIGHT HEART CATH;  Surgeon: Larey Dresser, MD;  Location: [facility];  Service: Cardiovascular;  Laterality: N/A;  . ROTATOR CUFF REPAIR     right  . TONSILLECTOMY    . TONSILLECTOMY    . VENTRAL HERNIA REPAIR N/A 08/23/2012   Procedure: HERNIA REPAIR VENTRAL ADULT;  Surgeon: Madilyn Hook, DO;  Location: [facility];  Service: General;  Laterality: N/A;    Current Outpatient Medications  Medication Sig Dispense Refill  . Acetaminophen (TYLENOL PO) Take by mouth. Takes with Aleve three times daily.    Marland Kitchen allopurinol (ZYLOPRIM) 100 MG tablet Take 50 mg by mouth daily.    Marland Kitchen atorvastatin  (LIPITOR) 40 MG tablet Take 1 tablet (40 mg total) by mouth daily at 6 PM. 30 tablet 1  . diltiazem (CARDIZEM CD) 120 MG 24 hr capsule Take 1 capsule (120 mg total) by mouth daily. 90 capsule 1  . escitalopram (LEXAPRO) 10 MG tablet Take 10 mg by mouth daily.    . finasteride (PROSCAR) 5 MG tablet Take 1 tablet (5 mg total) by mouth daily. (Patient taking differently: Take 5 mg by mouth at bedtime. ) 30 tablet 5  . FLONASE SENSIMIST 27.5 MCG/SPRAY nasal spray Place 1 spray into the nose daily as needed for rhinitis or allergies.     Marland Kitchen ipratropium (ATROVENT) 0.03 % nasal spray Place 2 sprays into both nostrils 2 (two) times daily.    Marland Kitchen latanoprost (XALATAN) 0.005 % ophthalmic solution Place 1 drop into both eyes at bedtime.     . methocarbamol (ROBAXIN) 500 MG tablet Take 500-1,000 mg by mouth every 8 (eight) hours as needed for muscle spasms.    . Naproxen Sodium (ALEVE PO) Take by mouth. Takes with Tylenol three times daily    . omeprazole (PRILOSEC) 20 MG capsule Take 20 mg by mouth daily.    . potassium chloride (KLOR-CON) 10 MEQ tablet Take 10 mEq by mouth daily.    . Riociguat (ADEMPAS) 1.5 MG TABS Take 2 mg by mouth 3 (three) times daily. 90 tablet 11  . rOPINIRole (REQUIP) 2 MG tablet Take 1 tablet (2 mg total) by mouth in the morning, at noon, and at bedtime. 90 tablet 3  . Selexipag 1600 MCG TABS Take 1.6 mcg by mouth 2 (two) times daily. UPTRAVI    . spironolactone (ALDACTONE) 25 MG tablet Take 1 tablet (25 mg total) by mouth daily. 30 tablet 0  . tiZANidine (ZANAFLEX) 2 MG tablet Take 2 mg by mouth 2 (two) times daily as needed for muscle spasms.    Marland Kitchen torsemide (DEMADEX) 20 MG tablet Take 3 tablets (60 mg total) by mouth  2 (two) times daily. 60 tablet 5  . traMADol (ULTRAM) 50 MG tablet Take 50 mg by mouth every 6 (six) hours as needed for moderate pain.     . traZODone (DESYREL) 100 MG tablet Take 100 mg by mouth at bedtime.    Marland Kitchen apixaban (ELIQUIS) 2.5 MG TABS tablet Take 1 tablet (2.5  mg total) by mouth 2 (two) times daily. (Patient not taking: Reported on 05/28/2019) 60 tablet 5   No current facility-administered medications for this visit.    Allergies as of 05/28/2019 - Review Complete 05/28/2019  Allergen Reaction Noted  . Pollen extract-tree extract Other (See Comments) 04/30/2011  . Molds & smuts Other (See Comments) 02/17/2011    Vitals: BP 109/62 (BP Location: Left Arm, Patient Position: Sitting)   Pulse 89   Temp 97.9 F (36.6 C) Comment: taken at front  Ht _0  (1.702 m)   Wt 194 lb (88 kg)   SpO2 98% Comment: pt had his own portable pulse ox. Pt on O2.  BMI 30.38 kg/m  Last Weight:  Wt Readings from Last 1 Encounters:  05/28/19 194 lb (88 kg)   Last Height:   Ht Readings from Last 1 Encounters:  05/28/19 _1  (1.702 m)     Physical exam: Exam: Gen: NAD, conversant, well nourised, obese                CV: RRR, no MRG. No Carotid Bruits. + peripheral edema, warm, nontender Eyes: Conjunctivae clear without exudates or hemorrhage  Neuro: Detailed Neurologic Exam  Speech:    Speech is normal; fluent and spontaneous with normal comprehension.  Cognition:    The patient is oriented to person, place, and time;     recent and remote memory intact;     language fluent;     normal attention, concentration,     fund of knowledge Cranial Nerves:    The pupils are equal, round, and reactive to light. The fundi are flat. Visual fields are full to finger confrontation. Extraocular movements are intact. Trigeminal sensation is intact and the muscles of mastication are normal. The face is symmetric. The palate elevates in the midline. Hearing intact. Voice is normal. Shoulder shrug is normal. The tongue has normal motion without fasciculations.   Coordination:    No dysmetria   Gait:    Wide-based but stable and patient can get out of seat independently but needs the aid of a walker  Motor Observation:    No asymmetry, no atrophy, and no  involuntary movements noted. Tone:    Normal muscle tone.    Posture:    Posture is normal. normal erect    Strength:    Strength is V/V in the upper and lower limbs.      Sensation: intact to LT     Reflex Exam:  DTR's:    Deep tendon reflexes in the upper and lower extremities are hyporeflexic but symmetrical bilaterally.   Toes:    The toes are equiv bilaterally.   Clonus:    Clonus is absent.    Assessment/Plan:   86 y.o. male here as requested by Roetta Sessions* for follow up after hospitalization. PMHx congestive heart failure, COPD on continuous O2, non-Hodgkin's lymphoma with a pelvis mass status post radiation therapy, diabetes mellitus, chronic kidney disease stage IV presenting with swollen legs, low back pain and restless legs, recently hospitalized for incidental right occipital infarct found to have afib and is now on eliquis.   -  04/2019: Patient presented with myoclonus and stuttering likely due to anxiety and stress, random and asynchronous jerking of all extremities, symptoms usually happen in the setting of discomfort, upset, back pain, severe anxiety, EEG 04/25/2019 in the hospital was negative and it was recommended that he follow-up with relaxation, distress and therapy for anxiety.  -imaging 04/24/2019: Incidental punctate right occipital infarct on MRI, MRA  was unremarkable, carotid Dopplers showed right ICA 40 to 59% stenosis, 2D echo ejection fraction 60 to 65% without source of embolus -Loop recorder was placed 04/2019 for severe left ventricular hypertrophy and diastolic congestive heart failure. + afib found on ILR -EEG 04/25/2019  showed no seizure or epileptiform activity, the jerking movements were not epileptiform in nature and showed no correlation with EEG. -LDL 103, hemoglobin A1c 5.9, Lipitor was added inpatient (admission in 04/2019) -He was not on antithrombotics prior to admission recently 04/2019, he was started on aspirin 325 mg daily but now  on eliquis for afib -For his swelling, follow-up with PCP, venous Dopplers negative 04/2019 -He is on ropinirole for restless legs but he is going to stop this, does not feel this is restless legs and is being managed by hospice now for pain -He has chronic low back pain and radiculopathy - see MRI lumbar spine above - being managed by hospice now for pain -Diabetic peripheral neuropathy - continue close management of diabetes, 04/2019 hgba1c 5.9 (8 months prior 8.9) - B12 deficiency neuropathy(B12 192) - needs injections, will recheck B12 today with MMA  . I had a long d/w patient about recent stroke, risk for recurrent stroke/TIAs, personally independently reviewed imaging studies and stroke evaluation results and answered questions.Continue Eliquis for secondary stroke prevention and maintain strict control of hypertension with blood pressure goal below 130/90, diabetes with hemoglobin A1c goal below 6.5% and lipids with LDL cholesterol goal below 70 mg/dL. I also advised the patient to eat a healthy diet with plenty of whole grains, cereals, fruits and vegetables, exercise as tolerated and maintain ideal body weight Continue PT.    Orders Placed This Encounter  Procedures  . Vitamin B12  . Methylmalonic acid, serum   Meds ordered this encounter  Medications  . cyanocobalamin ((VITAMIN B-12)) injection 1,000 mcg    Cc: Roetta Sessions*,    Sarina Ill, MD  [facility] [address] [place], [place] [address]  I spent 60 minutes of face-to-face and non-face-to-face time with patient on the  1. Cerebrovascular accident (CVA) due to embolism of precerebral artery ([place])   2. B12 deficiency   3. Chronic bilateral low back pain with bilateral sciatica   4. Diabetic polyneuropathy associated with type 2 diabetes mellitus (HCC)    diagnosis.  This included previsit chart review, lab review, study review, order entry, electronic health record  documentation, patient education on the different diagnostic and therapeutic options, counseling and coordination of care, risks and benefits of management, compliance, or risk factor reduction   Phone [phone] Fax [phone]

## 2019-05-28 NOTE — Patient Instructions (Addendum)
Fransico Him, MD  Vitamin B12 Deficiency Vitamin B12 deficiency means that your body does not have enough vitamin B12. The body needs this vitamin:  To make red blood cells.  To make genes (DNA).  To help the nerves work. If you do not have enough vitamin B12 in your body, you can have health problems. What are the causes?  Not eating enough foods that contain vitamin B12.  Not being able to absorb vitamin B12 from the food that you eat.  Certain digestive system diseases.  A condition in which the body does not make enough of a certain protein, which results in too few red blood cells (pernicious anemia).  Having a surgery in which part of the stomach or small intestine is removed.  Taking medicines that make it hard for the body to absorb vitamin B12. These medicines include: ? Heartburn medicines. ? Some antibiotic medicines. ? Other medicines that are used to treat certain conditions. What increases the risk?  Being older than age 71.  Eating a vegetarian or vegan diet, especially while you are pregnant.  Eating a poor diet while you are pregnant.  Taking certain medicines.  Having alcoholism. What are the signs or symptoms? In some cases, there are no symptoms. If the condition leads to too few blood cells or nerve damage, symptoms can occur, such as:  Feeling weak.  Feeling tired (fatigued).  Not being hungry.  Weight loss.  A loss of feeling (numbness) or tingling in your hands and feet.  Redness and burning of the tongue.  Being mixed up (confused) or having memory problems.  Sadness (depression).  Problems with your senses. This can include color blindness, ringing in the ears, or loss of taste.  Watery poop (diarrhea) or trouble pooping (constipation).  Trouble walking. If anemia is very bad, symptoms can include:  Being short of breath.  Being dizzy.  Having a very fast heartbeat. How is this treated?  Changing the way you eat and  drink, such as: ? Eating more foods that contain vitamin B12. ? Drinking little or no alcohol.  Getting vitamin B12 shots.  Taking vitamin B12 supplements. Your doctor will tell you the dose that is best for you. Follow these instructions at home: Eating and drinking   Eat lots of healthy foods that contain vitamin B12. These include: ? Meats and poultry, such as beef, pork, chicken, [place], and organ meats, such as liver. ? Seafood, such as clams, rainbow trout, salmon, tuna, and haddock. ? Eggs. ? Cereal and dairy products that have vitamin B12 added to them. Check the label. The items listed above may not be a complete list of what you can eat and drink. Contact a dietitian for more options. General instructions  Get any shots as told by your doctor.  Take supplements only as told by your doctor.  Do not drink alcohol if your doctor tells you not to. In some cases, you may only be asked to limit alcohol use.  Keep all follow-up visits as told by your doctor. This is important. Contact a doctor if:  Your symptoms come back. Get help right away if:  You have trouble breathing.  You have a very fast heartbeat.  You have chest pain.  You get dizzy.  You pass out. Summary  Vitamin B12 deficiency means that your body is not getting enough vitamin B12.  In some cases, there are no symptoms of this condition.  Treatment may include making a change in the way  you eat and drink, getting vitamin B12 shots, or taking supplements.  Eat lots of healthy foods that contain vitamin B12. This information is not intended to replace advice given to you by your health care provider. Make sure you discuss any questions you have with your health care provider. Document Revised: 09/27/2017 Document Reviewed: 09/27/2017 Elsevier Patient Education  2020 [employer].

## 2019-05-29 ENCOUNTER — Other Ambulatory Visit: Payer: Self-pay | Admitting: *Deleted

## 2019-05-29 ENCOUNTER — Telehealth: Payer: Self-pay | Admitting: *Deleted

## 2019-05-29 LAB — CUP PACEART REMOTE DEVICE CHECK
Date Time Interrogation Session: 20210426210312
Implantable Pulse Generator Implant Date: 20210325

## 2019-05-29 NOTE — Telephone Encounter (Signed)
-----  Message from Sueanne Margarita, MD sent at 05/28/2019  2:04 PM EDT ----- Regarding: RE: severe OSA Gae Bon please find out what happened with this  Traci ----- Message ----- From: Melvenia Beam, MD Sent: 05/28/2019   1:40 PM EDT To: Sueanne Margarita, MD Subject: severe OSA                                     Dr. Radford Pax, Patient is in my office today and we reviewed his sleep study, say he has not heard about getting a cpap or bipap would you have your office please follow up with him please? Thanks !! [facility]

## 2019-05-29 NOTE — Telephone Encounter (Signed)
Better night tried many attempts January 15, Jan 19, 22 , 26 ,Feb 1,17 and April 16 2019. They are going to try again after today.

## 2019-05-29 NOTE — Patient Outreach (Signed)
[facility]) Care Management  05/29/2019  Ryan [name] 1932/01/21 [id number]   No response from patient outreach attempts will proceed with case closure.     Objective:Per KPN (Knowledge Performance Now, point of care tool) and chart review,patient hospitalized 04/30/2019 - 05/04/2019 forAKI onChronic Kidney Disease Stage4 with azotemia. Patient hospitalized 04/24/2019 - 04/30/2019 for Acute CVA (cerebrovascular accident). Patient also has a history of Non Hodgkin's lymphomapelvic massstatus post radiation treatment, diabetes, hypertension,Acute on chronic diastolic CHF (congestive heart failure),Pulmonary hypertension,interstitial lung disease on home oxygen,anemia,Asthma,Barrett's esophagus,Benign prostatic hypertrophy,Bilateral lower extremity edema,COPD (chronic obstructive pulmonary disease),Diverticulosis of colon,Gluten intolerance,Hyperlipemia,Hiatal hernia,Melanoma,Osteoarthritis,Periaortic lymphadenopathy, andParoxysmal AtrialFibrillation.      Assessment: Received Ryan [name] Stroke Red Flag Alert follow up referral on 04/30/2019. Red Flag Alert Triggers, Day # 1, times 3, patient answered yes to the following question: Feeling worse overall?Patient answered no to the following questions:Able to eat and drink?Scheduled a follow-up appointment?[name] follow up not completed due to unable to contact patient and will proceed with case closure.    Plan:Case closure due to unable to reach.      Vimal Derego H. Annia Friendly, BSN, [place] [facility] Telephonic CM Phone: [phone] Fax: [phone]

## 2019-05-29 NOTE — Progress Notes (Signed)
ILR Remote 

## 2019-05-31 ENCOUNTER — Ambulatory Visit (HOSPITAL_COMMUNITY)
Admission: RE | Admit: 2019-05-31 | Discharge: 2019-05-31 | Disposition: A | Discharge status: Home / Self Care | Payer: Medicare HMO | Source: Ambulatory Visit | Attending: Cardiology | Admitting: Cardiology

## 2019-05-31 ENCOUNTER — Encounter (HOSPITAL_COMMUNITY): Payer: Self-pay

## 2019-05-31 ENCOUNTER — Other Ambulatory Visit: Payer: Self-pay

## 2019-05-31 VITALS — BP 108/62 | HR 86 | Wt 195.2 lb

## 2019-05-31 DIAGNOSIS — I272 Pulmonary hypertension, unspecified: Secondary | ICD-10-CM | POA: Diagnosis not present

## 2019-05-31 DIAGNOSIS — Z8249 Family history of ischemic heart disease and other diseases of the circulatory system: Secondary | ICD-10-CM | POA: Diagnosis not present

## 2019-05-31 DIAGNOSIS — C8590 Non-Hodgkin lymphoma, unspecified, unspecified site: Secondary | ICD-10-CM | POA: Diagnosis not present

## 2019-05-31 DIAGNOSIS — Z7901 Long term (current) use of anticoagulants: Secondary | ICD-10-CM | POA: Diagnosis not present

## 2019-05-31 DIAGNOSIS — Z8673 Personal history of transient ischemic attack (TIA), and cerebral infarction without residual deficits: Secondary | ICD-10-CM | POA: Diagnosis not present

## 2019-05-31 DIAGNOSIS — M545 Low back pain: Secondary | ICD-10-CM | POA: Insufficient documentation

## 2019-05-31 DIAGNOSIS — Z8042 Family history of malignant neoplasm of prostate: Secondary | ICD-10-CM | POA: Insufficient documentation

## 2019-05-31 DIAGNOSIS — Z923 Personal history of irradiation: Secondary | ICD-10-CM | POA: Insufficient documentation

## 2019-05-31 DIAGNOSIS — E669 Obesity, unspecified: Secondary | ICD-10-CM | POA: Diagnosis not present

## 2019-05-31 DIAGNOSIS — G8929 Other chronic pain: Secondary | ICD-10-CM | POA: Diagnosis not present

## 2019-05-31 DIAGNOSIS — J8410 Pulmonary fibrosis, unspecified: Secondary | ICD-10-CM | POA: Insufficient documentation

## 2019-05-31 DIAGNOSIS — I5033 Acute on chronic diastolic (congestive) heart failure: Secondary | ICD-10-CM | POA: Diagnosis not present

## 2019-05-31 DIAGNOSIS — K219 Gastro-esophageal reflux disease without esophagitis: Secondary | ICD-10-CM | POA: Insufficient documentation

## 2019-05-31 DIAGNOSIS — I480 Paroxysmal atrial fibrillation: Secondary | ICD-10-CM | POA: Diagnosis not present

## 2019-05-31 DIAGNOSIS — Z79899 Other long term (current) drug therapy: Secondary | ICD-10-CM | POA: Insufficient documentation

## 2019-05-31 DIAGNOSIS — I5032 Chronic diastolic (congestive) heart failure: Secondary | ICD-10-CM | POA: Insufficient documentation

## 2019-05-31 DIAGNOSIS — I130 Hypertensive heart and chronic kidney disease with heart failure and stage 1 through stage 4 chronic kidney disease, or unspecified chronic kidney disease: Secondary | ICD-10-CM | POA: Diagnosis not present

## 2019-05-31 DIAGNOSIS — N183 Chronic kidney disease, stage 3 unspecified: Secondary | ICD-10-CM | POA: Diagnosis not present

## 2019-05-31 DIAGNOSIS — Z6830 Body mass index (BMI) 30.0-30.9, adult: Secondary | ICD-10-CM | POA: Insufficient documentation

## 2019-05-31 DIAGNOSIS — Z87891 Personal history of nicotine dependence: Secondary | ICD-10-CM | POA: Diagnosis not present

## 2019-05-31 DIAGNOSIS — Z9981 Dependence on supplemental oxygen: Secondary | ICD-10-CM | POA: Insufficient documentation

## 2019-05-31 DIAGNOSIS — I6521 Occlusion and stenosis of right carotid artery: Secondary | ICD-10-CM | POA: Insufficient documentation

## 2019-05-31 DIAGNOSIS — Z79891 Long term (current) use of opiate analgesic: Secondary | ICD-10-CM | POA: Insufficient documentation

## 2019-05-31 DIAGNOSIS — G4733 Obstructive sleep apnea (adult) (pediatric): Secondary | ICD-10-CM | POA: Insufficient documentation

## 2019-05-31 LAB — CBC
HCT: 32.8 % — ABNORMAL LOW (ref 39.0–52.0)
Hemoglobin: 9.7 g/dL — ABNORMAL LOW (ref 13.0–17.0)
MCH: 28.3 pg (ref 26.0–34.0)
MCHC: 29.6 g/dL — ABNORMAL LOW (ref 30.0–36.0)
MCV: 95.6 fL (ref 80.0–100.0)
Platelets: 256 10*3/uL (ref 150–400)
RBC: 3.43 MIL/uL — ABNORMAL LOW (ref 4.22–5.81)
RDW: 16.4 % — ABNORMAL HIGH (ref 11.5–15.5)
WBC: 8.2 10*3/uL (ref 4.0–10.5)
nRBC: 0 % (ref 0.0–0.2)

## 2019-05-31 LAB — METHYLMALONIC ACID, SERUM: Methylmalonic Acid: 532 nmol/L — ABNORMAL HIGH (ref 0–378)

## 2019-05-31 LAB — BASIC METABOLIC PANEL
Anion gap: 11 (ref 5–15)
BUN: 59 mg/dL — ABNORMAL HIGH (ref 8–23)
CO2: 28 mmol/L (ref 22–32)
Calcium: 8.2 mg/dL — ABNORMAL LOW (ref 8.9–10.3)
Chloride: 100 mmol/L (ref 98–111)
Creatinine, Ser: 2.06 mg/dL — ABNORMAL HIGH (ref 0.61–1.24)
GFR calc Af Amer: 33 mL/min — ABNORMAL LOW (ref >60)
GFR calc non Af Amer: 29 mL/min — ABNORMAL LOW (ref >60)
Glucose, Bld: 95 mg/dL (ref 70–99)
Potassium: 5.2 mmol/L — ABNORMAL HIGH (ref 3.5–5.1)
Sodium: 139 mmol/L (ref 135–145)

## 2019-05-31 LAB — VITAMIN B12: Vitamin B-12: 231 pg/mL — ABNORMAL LOW (ref 232–1245)

## 2019-05-31 MED ORDER — TORSEMIDE 20 MG PO TABS: 80.0000 mg | ORAL_TABLET | Freq: Two times a day (BID) | ORAL | 5 refills | Status: DC

## 2019-05-31 NOTE — Progress Notes (Signed)
PCP: Roetta Sessions, NP Cardiology: Dr. Aundra Dubin  Reason for Visit: F/u for Chronic Diastolic HF/ Predominate RV Dysfunction + Pulmonary Hypertension   86 y.o. with history of interstitial lung disease (?NSIP vs UIP) on home oxygen, pulmonary hypertension, diastolic CHF, and prior non-Hodgkins lymphoma (pelvic mass, s/p XRT) presents for followup of CHF and pulmonary hypertension.  Patient has history of reportedly mild ILD (NSIP vs UIP) followed by a pulmonologist at [facility] and pulmonary hypertension thought to be out of proportion lung disease followed by a physician at [facility].  Last CT chest was in 11/19, showing stable ILD (?UIP vs NSIP).  He had RHC in 2/20 with moderate PAH, PVR 5.1 WU.  He was tried on Adcirca but developed cognitive problems and worsening of peripheral edema so this was stopped.  He has been on home oxygen, uses at night and with exertion.   He was admitted in 6/20 with worsening dyspnea and peripheral edema, weight up despite compliance with torsemide 40 mg bid at home.  He has CKD stage 3 and torsemide was decreased in the spring. He was diuresed aggressively with fall in weight.  Echo showed normal LV size and systolic function, RV reportedly normal.  6/20 RHC showed normal filling pressures with moderate pulmonary hypertension, PVR 5.35 WU.   After discharge from hospital, patient started on ambrisentan.  His weight steadily began to increase and he developed worsening lower extremity edema, we stopped ambrisentan.   Patient was admitted in 8/20 with AKI after taking metolazone for several days.  He was admitted again in 8/20 with acute on chronic diastolic CHF. He was diuresed.  Wandering atrial pacemaker noted.   He was admitted in 3/21 with suspected CVA, MRI head showed punctate occipital lobe infarct, thought to be due to small vessel disease. ILR was implanted.  Echo in 3/21 showed EF 60-65%, severe LVH, normal RV.  PYP scan in 3/21 was not suggestive  of TTR amyloidosis.  Subsequently, ILR has shown paroxysmal atrial fibrillation.   He was admitted later in 3/21 with AKI, creatinine up to 5.  However, he corrected rapidly to baseline with IVF and creatinine was 1.02 when discharge.   He returns for followup of CHF and pulmonary hypertension.  Last seen by Dr. Aundra Dubin 2 weeks ago. He was instructed to start Eliquis for PAF but has not started yet. Reports he was "waiting to see if lab tests were ok before starting".    He remains markedly volume overloaded w/ significant peripheral edema, 3+ LEE on exam. No resting dyspnea. Limited physically, due to chronic dyspnea, obesity, chronic back pain and chronic leg fatigue. Mostly uses a motorized wheel chair to get around, and sometimes uses a walker around his apartment. Lives at [place] [facility] w/ his wife.    Labs (6/20): K 4.7, creatinine 1.46 Labs (7/20): K 3.1, creatinine 1.67 Labs (8/20): K 3.9, creatinine 1.8 Labs (9/20): K 4.9, creatinine 1.76, BNP 78 Labs (10/20): K 4.2, creatininine 1.5 Labs (11/20): BNP 36, K 4.3, creatinine 1.4 Labs (3/21): myeloma panel negative, urine immunofixation negative.  Labs (4/21): K 4.5, creatinine 1.02, hgb 11.4 Labs (4/21): K 4.9, creatinine 1.66   PMH: 1. Non-Hodgkin's lymphoma: Pelvic involvement.  S/p radiation.  Thought to be in remission (Dr. Benay Spice).  2. HTN 3. PACs/PVCs 4. GERD: Barrett's esophagus.  5. 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  - High resolution CT chest 11/19: ILD, ?UIP pattern.  6. Pulmonary hypertension: Thought to be out of proportion to  underlying lung disease (ILD, ?NSIP vs UIP).   - RHC (2/20): mean RA 10, PASP 60 with mean 42, mean PCWP 11, CI 3, PVR 5.1 WU.  - Trial of Adcirca => failed due to ?memory worsening/cognitive affects and peripheral edema.  - Echo (6/20): EF 60-65%, mild LVH, RV reportedly normal, PASP 59 mmHg.  - RHC (6/20): mean RA 7, PA 50/24 mean 34, mean PCWP 12, CI 2.01, PAPI 3.7, PVR 5.35 WU - RF  negative, ANA negative, anti-SCL70 negative, anti-centromere negative.  - V/Q scan (8/20): No evidence for chronic PE.  - Echo (3/21): EF 60-65%, severe LVH, normal RV.  7. CKD: Stage 3.   8. Wandering atrial pacemaker 9. OSA 10. PYP scan (3/21): H/CL ratio 1.0, grade 0.  11. CVA: MRI 3/21 with punctate occipital lobe infarct thought to be due to small vessel disease. - Carotid dopplers (3/21): 40-59% RICA stenosis.  12. Atrial fibrillation: Paroxysmal, noted on ILR.   Social History   Socioeconomic History  . Marital status: Married    Spouse name: Not on file  . Number of children: 2  . Years of education: Not on file  . Highest education level: Not on file  Occupational History  . 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 Smoking status: Former Smoker    Packs/day: 1.00    Years: 35.00    Pack years: 35.00    Types: Pipe, Cigarettes    Quit date: 02/23/1992    Years since quitting: 27.2  . Smokeless tobacco: Never Used  . Tobacco comment: quit 20 years ago  Substance and Sexual Activity  . Alcohol use: Not Currently    Comment: 2 drinks daily scotch  AND WINE WITH SUPPER  . Drug use: No  . Sexual activity: Not on file  Other Topics Concern  . Not on file  Social History Narrative   Married - second marriage   He has two children   Retired Horticulturist, commercial Professor   Currently teaches pottery making, 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[facility]   Former Smoker quit 12 years ago- smoked for 35 years   Alcohol use- not currently   Right handed   Social Determinants of Radio broadcast assistant Strain:   . Difficulty of Paying Living Expenses:   Food Insecurity:   . Worried About Charity fundraiser in the Last Year:   . Arboriculturist in the Last Year:   Transportation Needs:   . Film/video editor (Medical):   Marland Kitchen Lack of Transportation (Non-Medical):   Physical Activity:   . Days of Exercise per Week:   . Minutes of Exercise per  Session:   Stress:   . Feeling of Stress :   Social Connections:   . Frequency of Communication with Friends and Family:   . Frequency of Social Gatherings with Friends and Family:   . Attends Religious Services:   . Active Member of Clubs or Organizations:   . Attends Archivist Meetings:   Marland Kitchen Marital Status:   Intimate Partner Violence:   . Fear of Current or Ex-Partner:   . Emotionally Abused:   Marland Kitchen Physically Abused:   . Sexually Abused:    Family History  Problem Relation Age of Onset  . Heart disease Father        MI 2  . Urticaria Father   . Prostate cancer Brother   . Prostate cancer Paternal Uncle   .  Prostate cancer Paternal Uncle   . Prostate cancer Paternal Uncle   . Hyperlipidemia Other   . Stroke Other   . Hypertension Other   . ADD / ADHD Other   . Colon cancer Neg Hx   . Allergic rhinitis Neg Hx   . Asthma Neg Hx   . Eczema Neg Hx    ROS: All systems reviewed and negative except as per HPI.   Current Outpatient Medications  Medication Sig Dispense Refill  . Acetaminophen (TYLENOL PO) Take by mouth. Takes with Aleve three times daily.    Marland Kitchen allopurinol (ZYLOPRIM) 100 MG tablet Take 50 mg by mouth daily.    Marland Kitchen apixaban (ELIQUIS) 2.5 MG TABS tablet Take 1 tablet (2.5 mg total) by mouth 2 (two) times daily. 60 tablet 5  . atorvastatin (LIPITOR) 40 MG tablet Take 1 tablet (40 mg total) by mouth daily at 6 PM. 30 tablet 1  . diltiazem (CARDIZEM CD) 120 MG 24 hr capsule Take 1 capsule (120 mg total) by mouth daily. 90 capsule 1  . escitalopram (LEXAPRO) 10 MG tablet Take 10 mg by mouth daily.    . finasteride (PROSCAR) 5 MG tablet Take 1 tablet (5 mg total) by mouth daily. (Patient taking differently: Take 5 mg by mouth at bedtime. ) 30 tablet 5  . FLONASE SENSIMIST 27.5 MCG/SPRAY nasal spray Place 1 spray into the nose daily as needed for rhinitis or allergies.     Marland Kitchen ipratropium (ATROVENT) 0.03 % nasal spray Place 2 sprays into both nostrils 2 (two) times  daily.    Marland Kitchen latanoprost (XALATAN) 0.005 % ophthalmic solution Place 1 drop into both eyes at bedtime.     . methocarbamol (ROBAXIN) 500 MG tablet Take 500-1,000 mg by mouth every 8 (eight) hours as needed for muscle spasms.    . Naproxen Sodium (ALEVE PO) Take by mouth. Takes with Tylenol three times daily    . omeprazole (PRILOSEC) 20 MG capsule Take 20 mg by mouth daily.    . potassium chloride (KLOR-CON) 10 MEQ tablet Take 10 mEq by mouth daily.    . Riociguat (ADEMPAS) 1.5 MG TABS Take 2 mg by mouth 3 (three) times daily. 90 tablet 11  . rOPINIRole (REQUIP) 2 MG tablet Take 1 tablet (2 mg total) by mouth in the morning, at noon, and at bedtime. 90 tablet 3  . Selexipag 1600 MCG TABS Take 1.6 mcg by mouth 2 (two) times daily. UPTRAVI    . tiZANidine (ZANAFLEX) 2 MG tablet Take 2 mg by mouth 2 (two) times daily as needed for muscle spasms.    Marland Kitchen torsemide (DEMADEX) 20 MG tablet Take 3 tablets (60 mg total) by mouth 2 (two) times daily. 60 tablet 5  . traMADol (ULTRAM) 50 MG tablet Take 50 mg by mouth every 6 (six) hours as needed for moderate pain.     . traZODone (DESYREL) 100 MG tablet Take 100 mg by mouth at bedtime.    Marland Kitchen spironolactone (ALDACTONE) 25 MG tablet Take 1 tablet (25 mg total) by mouth daily. 30 tablet 0   No current facility-administered medications for this encounter.   BP 108/62   Pulse 86   Wt 88.5 kg (195 lb 3.2 oz)   SpO2 96% Comment: on 2L  BMI 30.57 kg/m  PHYSICAL EXAM: General:  Elderly WM, no respiratory difficulty HEENT: normal Neck: supple. JVD elevated to ear. Carotids 2+ bilat; no bruits. No lymphadenopathy or thyromegaly appreciated. Cor: PMI nondisplaced. Irregular rhythm, regular rate. No  rubs, gallops or murmurs. Lungs: clear , no wheezing  Abdomen: obese, mildly distended nontender. No hepatosplenomegaly. No bruits or masses. Good bowel sounds. Extremities: no cyanosis, clubbing, rash, 3+ bilateral LEE Neuro: alert & oriented x 3, cranial nerves  grossly intact. moves all 4 extremities w/o difficulty. Affect pleasant.   Assessment/Plan: 1. Chronic diastolic CHF: Suspect with significant component of RV failure from pulmonary hypertension. Echo in 6/20 with EF 60-65%, RV not well-visualized but PASP 59 mmHg.  Echo (3/21) with EF 60-65%, severe LVH, normal RV, PASP 28 mmHg.  He has significant bilateral LEE on exam, also suspect he has underlying chronic venous insuffiencey as well. He previously has not tolerated compression stockings, but states the is he will to retry unna boots. He is chronically NYHA class III symptoms, but also limited physically by chronic low back pain and leg fatigue from chronic edema. No resting dyspnea. Feels that he has not been responding well to PO diuretics recently.  - Will place referral to remote health for IV Lasix 80 mg bid x 3 days and ask that they wrap legs in unna boots to help w/ LEE/ facilitate diuresis.  - after IV Lasix, transition back to torsemide but at increased dose of 80 mg bid (currently 60 mg bid) - check BMP today and again in 7 days    - Continue spironolactone 25 mg daily.  2. CKD Stage 3:  Recent AKI.  Check BMET today and again in 7 days w/ planned diuretic increase.  3. Pulmonary hypertnesion: Patient thought to have [place] out of proportion to his lung disease (ILD, NSIP versus UIP). RHC in 2/20 showed mean PA pressure 41, PVR 5.1 WU, CI 3. Echo in 6/20 with RV reportedly ok =>I reviewed and do not think that RV was well-visualized. Patient did not tolerate Adcirca due to edema/?cognitive effects.  RHC was done again in 6/20, showed moderate pulmonary arterial hypertension with PVR 5.35 (comparable to prior).  Serological workup for PH was negative. Patient started ambrisentan 5 mg and developed significant edema/volume overload despite a high dose of torsemide. He is now on Uptravi + riociguat.  Most recent echo in 3/21 showed normal RV size/systolic function and PA systolic pressure  estimate was normal.    - he will stay off ambrisentan (?trigger for volume overload).  - As above, he did not tolerate tadalafil.  - Continue Uptravi, now up to 1600 mcg bid.  - Continue to titrate up riociguat. - unable to complete 6 minute walk test today. Will attempt at next visit, if volume status improved 4. Atrial fibrillation: Paroxysmal, noted on ILR.  - Plan to start apixaban 2.5 mg bid (reduced dose given aged and SCr). Check CBC today and again in 7 days (chronic anemia).  5. OSA: Waiting on CPAP delivery through mail. We discussed importance of strict compliance w/ sleep given use of opioids for management of chronic pain.  6. CVA: In 3/21, may have been atrial fibrillation-related.  Starting apixaban per above give PAF detection on ILR.  7. Carotid stenosis: 40-59% RICA stenosis on 3/21 dopplers.  - Repeat carotid dopplers in 3/22.  8. Chronic Pain: Continues to struggle w/ chronic pain. Mainly w/ severe chronic low back pain, managed w/ opioids. He has been turned down from outpatient pain clinic due to being too high risk for chronic opioids due to pulmonary HTN.  He is considering transitioning to hospice care so that he can be managed w/ opioids for pain control. He reports he  has been approved w/ CPAP and is awaiting delivery. He understands risk for decreased respiratory drive w/ opioids.    F/u w/ APP in 2-3 weeks.    Lyda Jester PA-C 05/31/2019

## 2019-05-31 NOTE — Telephone Encounter (Signed)
After our call I asked our Operations Manager to try again to contact the patient and they were successful getting ahold of them.  We are now processing the patient for therapy.  We will keep you posted and let you know if we run into any issues.

## 2019-05-31 NOTE — Patient Instructions (Addendum)
START Eliquis 2.72m (1 tab) twice a day   You will receive a call from remote health to arrange for IV lasix.   AFTER IV lasix, INCREASE Torsemide to 850m(4 tabs) twice a day (DO NOT TAKE Torsemide on the days you receive IV Lasix)    Labs today and repeat bmp and cbc in 1 week We will only contact you if something comes back abnormal or we need to make some changes. Otherwise no news is good news!   Your physician recommends that you schedule a follow-up appointment in: 3 weeks with the Nurse Practitioner   Next appointment: Monday May 17th, 2021 at 12pm [place] [phone] if you have any questions or concerns.   At the [facility] and your health needs are our priority. As part of our continuing mission to provide you with exceptional heart care, we have created designated Provider Care Teams. These Care Teams include your primary Cardiologist (physician) and Advanced Practice Providers (APPs- Physician Assistants and Nurse Practitioners) who all work together to provide you with the care you need, when you need it.   You may see any of the following providers on your designated Care Team at your next follow up: . Marland Kitchenr DaGlori Bickers Dr DaLoralie Champagne AmDarrick GrinderNP . BrLyda JesterPA . LaAudry RilesPharmD   Please be sure to bring in all your medications bottles to every appointment.

## 2019-05-31 NOTE — Progress Notes (Signed)
Spoke with patients wife per request of patient to discuss AVS and plan. Questions answered. Message sent to PharmD Lauren to provide patient with assistance for Eliquis.  Referral sent to [facility] for IV lasix 61m BID x3 days, unna boots and repeat blood work in 1 week.

## 2019-05-31 NOTE — Addendum Note (Signed)
Encounter addended by: Valeda Malm, RN on: 05/31/2019 2:25 PM  Actions taken: Clinical Note Signed

## 2019-06-01 ENCOUNTER — Other Ambulatory Visit: Payer: Medicare HMO | Admitting: Hospice

## 2019-06-01 DIAGNOSIS — I5032 Chronic diastolic (congestive) heart failure: Secondary | ICD-10-CM

## 2019-06-01 DIAGNOSIS — Z515 Encounter for palliative care: Secondary | ICD-10-CM

## 2019-06-01 NOTE — Progress Notes (Signed)
[facility] Palliative Care Consult Note Telephone: [phone]  Fax: [phone]  PATIENT NAME: Ryan [name] DOB: Mar 02, 1931 MRN: [id number]  PRIMARY CARE PROVIDER:   Roetta Sessions, NP REFERRING PROVIDER:  Roetta Sessions, NP [address],  [place] [address] RESPONSIBLE PARTY:Jane Arya, wife [phone]    RECOMMENDATIONS/PLAN:   Advance Care Planning/Goals of Care: Patient is a DNR. MOST form selections include limited additional intervention, IV fluidsas indicated, Antibiotics as indicatedand tube feeding for a trial period.PCP referred patient for hospice admission; patient declined hospice services because he said his Eliquis would not be covered.  He said he was informed that there will be a special grant to cover it for him when he decides to get on hospice.  He is still open to hospice services in the future when the time comes. Called spouse, not picked after ringing, voicemail not available.  Symptom management: Patient was at the CHF clinic yesterday for acute on chronic congestive heart failure. He was referred to [facility] for IV Lasix 80 mg twice daily x3 days Unna boots and to repeat blood work in 1 week. Nurse Ebony Hail with [facility] at home with patient  during visit; she was administering the IV Lasix.  Elevation of lower extremities encouraged.  Patient reported that PCP visited last week and had ordered a new CPAP for him.  She also ordered Tizanidine 2 mg twice daily for pain.  Patient said he now has a TENS machine that is really helpful with his pain.  Patient denies pain at this time.  Patient on oxygen 2L/Min during visit no coughing no respiratory distress.  Encouraged ongoing care. Follow TV:[id number] care will continue to follow patient for goals of care clarification and symptom management. I spent 55 minutes providing this consultation; time includes chart review and  documentation. More than 50% of the time in this consultation was spent coordinating communication.  HISTORY OF PRESENT ILLNESS:Ryan [name] a 86 y.o.year oldmalewith multiple medical problems includingrecent CVA,CHF, COPD, pulmonary fibrosis, HTN, non hodgkin's lymphoma status post radiation. Palliative Care was asked to help address goals of care. CODE STATUS: DNR  PPS: 50% HOSPICE ELIGIBILITY/DIAGNOSIS: TBD  PAST MEDICAL HISTORY:  Past Medical History:  Diagnosis Date  . Asthma   . Barrett's esophagus   . Benign prostatic hypertrophy   . Bilateral lower extremity edema   . CHF (congestive heart failure) ([place])   . Chronic fatigue   . COPD (chronic obstructive pulmonary disease) ([place])   . Diabetes mellitus type 2 in nonobese ([place]) 04/24/2019  . Diverticulosis of colon (without mention of hemorrhage)   . Esophageal reflux   . Gastritis   . Gluten intolerance   . Hiatal hernia 02/10/11   Noted on CT Scan - Moderate Hiatal Hernia  . Hyperlipemia   . Hypertension   . 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 Melanoma ([place])    lymphoma  . Microcytic anemia 04/20/2011   . 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 Osteoarthritis    hands/feet,knees, NECK, BACK  . Periaortic lymphadenopathy 02/16/2011  . Personal history of colonic polyps 2004   hyperplastic Dr. Collene Mares  . Pulmonary hyperinflation   . Renal cyst 02/10/11    Noted on CT Scan - Bilateral Renal Cysts  . S/P radiation therapy 03/15/11 - 04/09/11   Abdominal/ Pelvic Tumor, 3600  cGy/20 Fractions    SOCIAL HX:  Social History   Tobacco Use  . Smoking status: Former Smoker    Packs/day: 1.00    Years: 35.00    Pack years: 35.00    Types: Pipe, Cigarettes    Quit date: 02/23/1992    Years since quitting: 27.2  . Smokeless tobacco: Never Used  . Tobacco comment: quit 20 years ago  Substance Use Topics  .  Alcohol use: Not Currently    Comment: 2 drinks daily scotch  AND WINE WITH SUPPER    ALLERGIES:  Allergies  Allergen Reactions  . Pollen Extract-Tree Extract Other (See Comments)    HEADACHES, TIRED , DRAINAGE FROM SINUSES  . Molds & Smuts Other (See Comments)    Also dust mites causes sinus infections, h/a etc.     PERTINENT MEDICATIONS:  Outpatient Encounter Medications as of 06/01/2019  Medication Sig  . Acetaminophen (TYLENOL PO) Take by mouth. Takes with Aleve three times daily.  Marland Kitchen allopurinol (ZYLOPRIM) 100 MG tablet Take 50 mg by mouth daily.  Marland Kitchen apixaban (ELIQUIS) 2.5 MG TABS tablet Take 1 tablet (2.5 mg total) by mouth 2 (two) times daily.  Marland Kitchen atorvastatin (LIPITOR) 40 MG tablet Take 1 tablet (40 mg total) by mouth daily at 6 PM.  . diltiazem (CARDIZEM CD) 120 MG 24 hr capsule Take 1 capsule (120 mg total) by mouth daily.  Marland Kitchen escitalopram (LEXAPRO) 10 MG tablet Take 10 mg by mouth daily.  . finasteride (PROSCAR) 5 MG tablet Take 1 tablet (5 mg total) by mouth daily. (Patient taking differently: Take 5 mg by mouth at bedtime. )  . FLONASE SENSIMIST 27.5 MCG/SPRAY nasal spray Place 1 spray into the nose daily as needed for rhinitis or allergies.   Marland Kitchen ipratropium (ATROVENT) 0.03 % nasal spray Place 2 sprays into both nostrils 2 (two) times daily.  Marland Kitchen latanoprost (XALATAN) 0.005 % ophthalmic solution Place 1 drop into both eyes at bedtime.   . methocarbamol (ROBAXIN) 500 MG tablet Take 500-1,000 mg by mouth every 8 (eight) hours as needed for muscle spasms.  . Naproxen Sodium (ALEVE PO) Take by mouth. Takes with Tylenol three times daily  . omeprazole (PRILOSEC) 20 MG capsule Take 20 mg by mouth daily.  . potassium chloride (KLOR-CON) 10 MEQ tablet Take 10 mEq by mouth daily.  . Riociguat (ADEMPAS) 1.5 MG TABS Take 2 mg by mouth 3 (three) times daily.  Marland Kitchen rOPINIRole (REQUIP) 2 MG tablet Take 1 tablet (2 mg total) by mouth in the morning, at noon, and at bedtime.  . Selexipag 1600 MCG  TABS Take 1.6 mcg by mouth 2 (two) times daily. UPTRAVI  . spironolactone (ALDACTONE) 25 MG tablet Take 1 tablet (25 mg total) by mouth daily.  Marland Kitchen tiZANidine (ZANAFLEX) 2 MG tablet Take 2 mg by mouth 2 (two) times daily as needed for muscle spasms.  Marland Kitchen torsemide (DEMADEX) 20 MG tablet Take 4 tablets (80 mg total) by mouth 2 (two) times daily.  . traMADol (ULTRAM) 50 MG tablet Take 50 mg by mouth every 6 (six) hours as needed for moderate pain.   . traZODone (DESYREL) 100 MG tablet Take 100 mg by mouth at bedtime.   No facility-administered encounter medications on file as of 06/01/2019.    PHYSICAL EXAM/ROS: General: NAD, cooperative. Cardiovascular: regular rate and rhythm Pulmonary: clear ant fields, normal respiratory effort on 02 2L/Min, no coughing no shortness of breath Abdomen: soft, nontender, + bowel sounds GU: no suprapubic tenderness Extremities: Coban wrap  to BLE for edema Skin: Coban wrap to BLE; no rash to exposed skin Neurological: Weakness but otherwise nonfocal  Teodoro Spray, NP

## 2019-06-02 ENCOUNTER — Emergency Department (HOSPITAL_COMMUNITY): Payer: Medicare HMO

## 2019-06-02 ENCOUNTER — Encounter (HOSPITAL_COMMUNITY): Payer: Self-pay | Admitting: Emergency Medicine

## 2019-06-02 ENCOUNTER — Other Ambulatory Visit: Payer: Self-pay

## 2019-06-02 ENCOUNTER — Emergency Department (HOSPITAL_COMMUNITY)
Admission: EM | Admit: 2019-06-02 | Discharge: 2019-06-03 | Disposition: A | Discharge status: Home / Self Care | Payer: Medicare HMO | Attending: Emergency Medicine | Admitting: Emergency Medicine

## 2019-06-02 DIAGNOSIS — N183 Chronic kidney disease, stage 3 unspecified: Secondary | ICD-10-CM | POA: Insufficient documentation

## 2019-06-02 DIAGNOSIS — I5032 Chronic diastolic (congestive) heart failure: Secondary | ICD-10-CM | POA: Insufficient documentation

## 2019-06-02 DIAGNOSIS — E1122 Type 2 diabetes mellitus with diabetic chronic kidney disease: Secondary | ICD-10-CM | POA: Insufficient documentation

## 2019-06-02 DIAGNOSIS — R609 Edema, unspecified: Secondary | ICD-10-CM | POA: Diagnosis not present

## 2019-06-02 DIAGNOSIS — C8590 Non-Hodgkin lymphoma, unspecified, unspecified site: Secondary | ICD-10-CM | POA: Diagnosis not present

## 2019-06-02 DIAGNOSIS — J449 Chronic obstructive pulmonary disease, unspecified: Secondary | ICD-10-CM | POA: Diagnosis not present

## 2019-06-02 DIAGNOSIS — Z7901 Long term (current) use of anticoagulants: Secondary | ICD-10-CM | POA: Diagnosis not present

## 2019-06-02 DIAGNOSIS — I130 Hypertensive heart and chronic kidney disease with heart failure and stage 1 through stage 4 chronic kidney disease, or unspecified chronic kidney disease: Secondary | ICD-10-CM | POA: Insufficient documentation

## 2019-06-02 DIAGNOSIS — Z79899 Other long term (current) drug therapy: Secondary | ICD-10-CM | POA: Insufficient documentation

## 2019-06-02 DIAGNOSIS — R072 Precordial pain: Secondary | ICD-10-CM | POA: Insufficient documentation

## 2019-06-02 DIAGNOSIS — Z66 Do not resuscitate: Secondary | ICD-10-CM | POA: Diagnosis not present

## 2019-06-02 DIAGNOSIS — Z87891 Personal history of nicotine dependence: Secondary | ICD-10-CM | POA: Insufficient documentation

## 2019-06-02 LAB — CBC
HCT: 34.3 % — ABNORMAL LOW (ref 39.0–52.0)
Hemoglobin: 10.6 g/dL — ABNORMAL LOW (ref 13.0–17.0)
MCH: 29.1 pg (ref 26.0–34.0)
MCHC: 30.9 g/dL (ref 30.0–36.0)
MCV: 94.2 fL (ref 80.0–100.0)
Platelets: 306 10*3/uL (ref 150–400)
RBC: 3.64 MIL/uL — ABNORMAL LOW (ref 4.22–5.81)
RDW: 16.2 % — ABNORMAL HIGH (ref 11.5–15.5)
WBC: 8.4 10*3/uL (ref 4.0–10.5)
nRBC: 0 % (ref 0.0–0.2)

## 2019-06-02 LAB — BASIC METABOLIC PANEL
Anion gap: 15 (ref 5–15)
BUN: 57 mg/dL — ABNORMAL HIGH (ref 8–23)
CO2: 28 mmol/L (ref 22–32)
Calcium: 8.8 mg/dL — ABNORMAL LOW (ref 8.9–10.3)
Chloride: 96 mmol/L — ABNORMAL LOW (ref 98–111)
Creatinine, Ser: 2.01 mg/dL — ABNORMAL HIGH (ref 0.61–1.24)
GFR calc Af Amer: 34 mL/min — ABNORMAL LOW (ref >60)
GFR calc non Af Amer: 29 mL/min — ABNORMAL LOW (ref >60)
Glucose, Bld: 103 mg/dL — ABNORMAL HIGH (ref 70–99)
Potassium: 4.7 mmol/L (ref 3.5–5.1)
Sodium: 139 mmol/L (ref 135–145)

## 2019-06-02 LAB — TROPONIN I (HIGH SENSITIVITY)
Troponin I (High Sensitivity): 9 ng/L (ref <18)
Troponin I (High Sensitivity): 11 ng/L (ref <18)

## 2019-06-02 MED ORDER — TRAMADOL HCL 50 MG PO TABS
50.0000 mg | ORAL_TABLET | Freq: Once | ORAL | Status: DC
  Filled 2019-06-02: qty 1

## 2019-06-02 MED ORDER — SODIUM CHLORIDE 0.9% FLUSH: 3.0000 mL | Freq: Once | INTRAVENOUS | Status: DC

## 2019-06-02 NOTE — ED Provider Notes (Signed)
[facility] EMERGENCY DEPARTMENT Provider Note   CSN: [id number] Arrival date & time: 06/02/19  1350    History Chief Complaint  Patient presents with  . Chest Pain    SCOTTIE METAYER is a 86 y.o. male with past medical history significant for asthma, CHF with significant lower extremity edema, COPD, diabetes, hypertension, hyperlipidemia, chronic back pain, non-Hodgkin's lymphoma in remission, pulmonary hypertension who presents for evaluation of chest pain.  Patient states he is getting home health to do IV Lasix for his lower extremity edema.  He has also been getting some home exercises.  Patient states he received Lasix earlier today and developed some chest pain.  He no longer has chest pain.  Unable to describe this.  States it only lasted for "a few minutes."  States he does have some posterior neck pain which she has had for "a long time."  Denies any radiation of pain into his back, left arm.  Admits to chronic lower back pain.  Denies any additional shortness of breath.  Admits to PND, orthopnea.  No recent chiropractic treatments.  Followed by [facility] cardiology.  Denies additional aggravating or relieving factors.   History obtained from patient and past medical records.  No interpreter is used.  Currently on palliative care.  Patient is a Do Not resuscitate. Not currently on hospice  HPI  HPI: A 86 year old patient with a history of CVA and hypertension presents for evaluation of chest pain. Initial onset of pain was more than 6 hours ago. The patient's chest pain is not worse with exertion. The patient's chest pain is not middle- or left-sided, is not well-localized, is not described as heaviness/pressure/tightness, is not sharp and does not radiate to the arms/jaw/neck. The patient does not complain of nausea and denies diaphoresis. The patient has no history of peripheral artery disease, has not smoked in the past 90 days, denies any history of treated diabetes,  has no relevant family history of coronary artery disease (first degree relative at less than age 88), has no history of hypercholesterolemia and does not have an elevated BMI (>=30).   Past Medical History:  Diagnosis Date  . Asthma   . Barrett's esophagus   . Benign prostatic hypertrophy   . Bilateral lower extremity edema   . CHF (congestive heart failure) ([place])   . Chronic fatigue   . COPD (chronic obstructive pulmonary disease) ([place])   . Diabetes mellitus type 2 in nonobese ([place]) 04/24/2019  . Diverticulosis of colon (without mention of hemorrhage)   . Esophageal reflux   . Gastritis   . Gluten intolerance   . Hiatal hernia 02/10/11   Noted on CT Scan - Moderate Hiatal Hernia  . Hyperlipemia   . Hypertension   . Lymphoma of lymph nodes in pelvis ([place]) 03/03/2011    Large Right Retroperitoneal Mass  . Melanoma ([place])    lymphoma  . Microcytic anemia 04/20/2011   . 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 Osteoarthritis    hands/feet,knees, NECK, BACK  . Periaortic lymphadenopathy 02/16/2011  . Personal history of colonic polyps 2004   hyperplastic Dr. Collene Mares  . Pulmonary hyperinflation   . Renal cyst 02/10/11    Noted on CT Scan - Bilateral Renal Cysts  . S/P radiation therapy 03/15/11 - 04/09/11   Abdominal/ Pelvic Tumor, 3600 cGy/20 Fractions    Patient Active Problem List   Diagnosis Date Noted  . B12 deficiency 05/28/2019  .  Cerebrovascular accident (CVA) due to embolism of precerebral artery ([place]) 05/28/2019  . Diabetic polyneuropathy associated with type 2 diabetes mellitus ([place]) 05/28/2019  . Acute CVA (cerebrovascular accident) ([place]) 04/24/2019  . Diabetes mellitus type 2 in nonobese ([place]) 04/24/2019  . Acute respiratory failure with hypoxia and hypercarbia ([place]) 09/20/2018  . CKD (chronic kidney disease) stage 3, GFR 30-59 ml/min 09/20/2018  . Lethargy 09/20/2018  . Acute metabolic encephalopathy 21/22/4825  .  CHF (congestive heart failure) ([place]) 09/20/2018  . AKI (acute kidney injury) ([place]) 09/10/2018  . Pulmonary hypertension, unspecified ([place]) 09/10/2018  . Multifocal atrial tachycardia ([place]) 09/10/2018  . CHF exacerbation ([place]) 09/07/2018  . Leg pain 09/07/2018  . Hyperkalemia 07/26/2018  . Acute on chronic diastolic CHF (congestive heart failure) ([place])   . Bilateral lower extremity edema 02/02/2017  . Pulmonary fibrosis ([place]) 11/22/2016  . Blurry vision, bilateral 10/26/2016  . COPD ([place]) 07/05/2016  . Exertional dyspnea 09/10/2015  . Fatigue/malaise 08/19/2015  . Asthma/COPD 02/12/2015  . 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 Hypoxemia 01/05/2014  . Essential hypertension 01/05/2014  . Hypokalemia 01/05/2014  . Urinary retention 01/05/2014  . Hypoxia   . Lumbar radiculopathy 12/31/2013  . RLS (restless legs syndrome) 09/21/2012  . Chronic bilateral low back pain with bilateral sciatica 06/15/2012  . Abdominal pain, unspecified site 05/19/2012  . Unspecified deficiency anemia 04/07/2012  . Melanoma ([place])   . Non Hodgkin's lymphoma ([place])   . Periaortic lymphadenopathy 02/16/2011  . Barrett's esophagus 07/09/2010  . Personal history of colonic polyps 05/28/2010  . History of IBS 05/28/2010  . Diverticulosis of colon (without mention of hemorrhage) 05/28/2010  . Arthritis involving multiple sites 05/28/2010  . Wheat intolerance 05/28/2010  . Chronic venous insufficiency 08/19/2009  . Peripheral edema 08/19/2009  . INSOMNIA, CHRONIC 12/24/2008  . EUSTACHIAN TUBE DYSFUNCTION, BILATERAL 12/23/2008  . ERECTILE DYSFUNCTION, ORGANIC 08/01/2008  . Allergic rhinitis with a nonallergic component 05/20/2008  . VENTRAL HERNIA 02/12/2008  . PALPITATIONS 12/25/2007  . LACTOSE INTOLERANCE 10/17/2007  . Hyperlipidemia 09/14/2006  . BPH (benign prostatic hyperplasia) 09/14/2006  . OSTEOARTHRITIS 09/14/2006  . HEART MURMUR, HX OF 09/14/2006    Past Surgical History:  Procedure  Laterality Date  . ARTHROSCOPIC REPAIR ACL     right  . BONE MARROW ASPIRATION  02/25/11   Bone Marrow, Aspirate, Clot, and Bilateral Bx, Right PIC  . CATARACT EXTRACTION, BILATERAL    . EYE SURGERY    . HERNIA REPAIR     LEFT INGUINAL   . INSERTION OF MESH N/A 08/23/2012   Procedure: INSERTION OF MESH;  Surgeon: Madilyn Hook, DO;  Location: [facility];  Service: General;  Laterality: N/A;  . LOOP RECORDER INSERTION N/A 04/26/2019   Procedure: LOOP RECORDER INSERTION;  Surgeon: Evans Lance, MD;  Location: [facility];  Service: Cardiovascular;  Laterality: N/A;  . LUMBAR LAMINECTOMY/DECOMPRESSION MICRODISCECTOMY Right 12/31/2013   Procedure: RIGHT L4-5 L5-S1 LAMINECTOMY;  Surgeon: Kristeen Miss, MD;  Location: [facility] OR[facility];  Service: Neurosurgery;  Laterality: Right;  RIGHT L4-5 L5-S1 LAMINECTOMY  . RIGHT HEART CATH N/A 07/25/2018   Procedure: RIGHT HEART CATH;  Surgeon: Larey Dresser, MD;  Location: [facility] CV [facility];  Service: Cardiovascular;  Laterality: N/A;  . ROTATOR CUFF REPAIR     right  . TONSILLECTOMY    . TONSILLECTOMY    . VENTRAL HERNIA REPAIR N/A 08/23/2012   Procedure: HERNIA REPAIR VENTRAL ADULT;  Surgeon: Madilyn Hook, DO;  Location: [facility];  Service: General;  Laterality: N/A;       Family History  Problem Relation Age of Onset  . Heart disease Father        MI 27  . Urticaria Father   . Prostate cancer Brother   . Prostate cancer Paternal Uncle   . Prostate cancer Paternal Uncle   . Prostate cancer Paternal Uncle   . Hyperlipidemia Other   . Stroke Other   . Hypertension Other   . ADD / ADHD Other   . Colon cancer Neg Hx   . Allergic rhinitis Neg Hx   . Asthma Neg Hx   . Eczema Neg Hx     Social History   Tobacco Use  . Smoking status: Former Smoker    Packs/day: 1.00    Years: 35.00    Pack years: 35.00    Types: Pipe, Cigarettes    Quit date: 02/23/1992    Years since quitting: 27.2  . Smokeless tobacco: Never Used  . Tobacco comment:  quit 20 years ago  Substance Use Topics  . Alcohol use: Not Currently    Comment: 2 drinks daily scotch  AND WINE WITH SUPPER  . Drug use: No    Home Medications Prior to Admission medications   Medication Sig Start Date End Date Taking? Authorizing Provider  Acetaminophen (TYLENOL PO) Take by mouth. Takes with Aleve three times daily.    [provider]  allopurinol (ZYLOPRIM) 100 MG tablet Take 50 mg by mouth daily. 12/13/17   [provider]  apixaban (ELIQUIS) 2.5 MG TABS tablet Take 1 tablet (2.5 mg total) by mouth 2 (two) times daily. 05/17/19   Larey Dresser, MD  atorvastatin (LIPITOR) 40 MG tablet Take 1 tablet (40 mg total) by mouth daily at 6 PM. 04/27/19   Hosie Poisson, MD  diltiazem (CARDIZEM CD) 120 MG 24 hr capsule Take 1 capsule (120 mg total) by mouth daily. 05/04/19   Mercy Riding, MD  escitalopram (LEXAPRO) 10 MG tablet Take 10 mg by mouth daily.    [provider]  finasteride (PROSCAR) 5 MG tablet Take 1 tablet (5 mg total) by mouth daily. Patient taking differently: Take 5 mg by mouth at bedtime.  12/25/12   Norins, Heinz Knuckles, MD  FLONASE SENSIMIST 27.5 MCG/SPRAY nasal spray Place 1 spray into the nose daily as needed for rhinitis or allergies.  12/11/18   [provider]  ipratropium (ATROVENT) 0.03 % nasal spray Place 2 sprays into both nostrils 2 (two) times daily. 02/05/19   [provider]  latanoprost (XALATAN) 0.005 % ophthalmic solution Place 1 drop into both eyes at bedtime.  01/20/18   [provider]  methocarbamol (ROBAXIN) 500 MG tablet Take 500-1,000 mg by mouth every 8 (eight) hours as needed for muscle spasms.    [provider]  Naproxen Sodium (ALEVE PO) Take by mouth. Takes with Tylenol three times daily    [provider]  omeprazole (PRILOSEC) 20 MG capsule Take 20 mg by mouth daily.    [provider]  potassium chloride (KLOR-CON) 10 MEQ tablet Take 10 mEq by mouth daily.     [provider]  Riociguat (ADEMPAS) 1.5 MG TABS Take 2 mg by mouth 3 (three) times daily. 05/16/19   Larey Dresser, MD  rOPINIRole (REQUIP) 2 MG tablet Take 1 tablet (2 mg total) by mouth in the morning, at noon, and at bedtime. 03/20/19   Kathrynn Ducking, MD  Selexipag 1600 MCG TABS Take 1.6  mcg by mouth 2 (two) times daily. UPTRAVI    [provider]  spironolactone (ALDACTONE) 25 MG tablet Take 1 tablet (25 mg total) by mouth daily. 09/27/18 05/28/19  Amin, Jeanella Flattery, MD  tiZANidine (ZANAFLEX) 2 MG tablet Take 2 mg by mouth 2 (two) times daily as needed for muscle spasms.    [provider]  torsemide (DEMADEX) 20 MG tablet Take 4 tablets (80 mg total) by mouth 2 (two) times daily. 05/31/19   Lyda Jester M, PA-C  traMADol (ULTRAM) 50 MG tablet Take 50 mg by mouth every 6 (six) hours as needed for moderate pain.     [provider]  traZODone (DESYREL) 100 MG tablet Take 100 mg by mouth at bedtime. 02/12/19   [provider]    Allergies    Pollen extract-tree extract and Molds & smuts  Review of Systems   Review of Systems  Constitutional: Negative.   HENT: Negative.   Respiratory: Positive for shortness of breath (Chronic DOE). Negative for apnea, cough, choking, chest tightness, wheezing and stridor.   Cardiovascular: Positive for chest pain. Negative for palpitations and leg swelling.  Gastrointestinal: Negative.   Genitourinary: Negative.   Musculoskeletal: Positive for neck pain. Negative for arthralgias, back pain, gait problem, joint swelling, myalgias and neck stiffness.  Skin: Positive for wound.  Neurological: Positive for weakness (Chronic).  All other systems reviewed and are negative.   Physical Exam Updated Vital Signs BP 122/76   Pulse 94   Temp 98.5 F (36.9 C) (Oral)   Resp (!) 21   Ht 5' 7.25" (1.708 m)   Wt 88.5 kg   SpO2 97%   BMI 30.31 kg/m   Physical Exam Vitals and nursing note reviewed.    Constitutional:      General: He is not in acute distress.    Appearance: He is well-developed. He is obese. He is ill-appearing (Chronically ill appearing). He is not toxic-appearing or diaphoretic.  HENT:     Head: Normocephalic and atraumatic.  Eyes:     Pupils: Pupils are equal, round, and reactive to light.  Cardiovascular:     Rate and Rhythm: Normal rate and regular rhythm.     Pulses:          Radial pulses are 2+ on the right side and 2+ on the left side.       Posterior tibial pulses are detected w/ Doppler on the right side and detected w/ Doppler on the left side.     Heart sounds: Normal heart sounds.     Comments: Lower extremity pulses detected with Doppler due to edema. Pulmonary:     Effort: Pulmonary effort is normal. No respiratory distress.     Breath sounds: Normal breath sounds.     Comments: 2 L oxygen via nasal cannula at baseline. Speaks in full sentences without difficulty Abdominal:     General: Bowel sounds are normal. There is no distension.     Palpations: Abdomen is soft.     Comments: Soft, nontender  Musculoskeletal:        General: Normal range of motion.     Cervical back: Normal range of motion and neck supple.     Right lower leg: Edema present.     Left lower leg: Edema present.     Comments: Moves all four extremities without difficulty.  No bony tenderness.  Bilateral lower extremity wrapped in compression wraps.  Skin:    General: Skin is warm and dry.  Comments: 3+ pitting edema to bilateral lower extremities to knees.  Neurological:     General: No focal deficit present.     Mental Status: He is alert.     Cranial Nerves: Cranial nerves are intact.     Sensory: Sensation is intact.     Motor: Motor function is intact.     Coordination: Coordination is intact.     Comments: Cranial nerves II through XII grossly intact.     ED Results / Procedures / Treatments   Labs (all labs ordered are listed, but only abnormal results are  displayed) Labs Reviewed  BASIC METABOLIC PANEL - Abnormal; Notable for the following components:      Result Value   Chloride 96 (*)    Glucose, Bld 103 (*)    BUN 57 (*)    Creatinine, Ser 2.01 (*)    Calcium 8.8 (*)    GFR calc non Af Amer 29 (*)    GFR calc Af Amer 34 (*)    All other components within normal limits  CBC - Abnormal; Notable for the following components:   RBC 3.64 (*)    Hemoglobin 10.6 (*)    HCT 34.3 (*)    RDW 16.2 (*)    All other components within normal limits  TROPONIN I (HIGH SENSITIVITY)  TROPONIN I (HIGH SENSITIVITY)    EKG EKG Interpretation  Date/Time:  Saturday Jun 02 2019 13:57:31 EDT Ventricular Rate:  88 PR Interval:  162 QRS Duration: 110 QT Interval:  360 QTC Calculation: 435 R Axis:   86 Text Interpretation: Sinus rhythm with Premature atrial complexes Incomplete right bundle branch block Borderline ECG Confirmed by Sherwood Gambler [phone]) on 06/02/2019 4:22:20 PM   Radiology DG Chest 2 View  Result Date: 06/02/2019 CLINICAL DATA:  Chest pain. EXAM: CHEST - 2 VIEW COMPARISON:  Radiograph 04/30/2019. Chest CT 11/16/2016 FINDINGS: Implanted loop recorder projects over the left chest wall. Mild cardiomegaly. Left lung base hiatal hernia with adjacent atelectasis, as seen on prior CT. Minor streaky atelectasis at the right lung base. Vascular congestion without overt pulmonary edema. There is an azygos fissure. Right paratracheal density is tortuous vasculature. No confluent consolidation, pleural effusion, or pneumothorax. No acute osseous abnormalities are seen. IMPRESSION: 1. Mild cardiomegaly with vascular congestion. 2. Left lung base hiatal hernia with adjacent atelectasis. 3. Minor streaky right lung base atelectasis. Electronically Signed   By: Keith Rake M.D.   On: 06/02/2019 15:25    Procedures Procedures (including critical care time)  Medications Ordered in ED Medications  sodium chloride flush (NS) 0.9 % injection 3 mL  (has no administration in time range)  traMADol (ULTRAM) tablet 50 mg (50 mg Oral Not Given 06/02/19 2048)    ED Course  I have reviewed the triage vital signs and the nursing notes.  Pertinent labs & imaging results that were available during my care of the patient were reviewed by me and considered in my medical decision making (see chart for details).  86 year old male presents for evaluation of chest pain.  He is afebrile, nonseptic, not ill-appearing.  Is being seen by palliative care and home health.  He is getting IV Lasix twice daily for lower extremity edema and heart failure.  He is on chronic oxygen.  Was given Lasix and noted chest pain.  States this lasted a couple minutes and self resolved.  Is not exertional or pleuritic in nature.  States he does have some chronic posterior neck pain which he states  began after he started doing exercises at his facility.  This chest pain does not radiate to his left arm, jaw, back.  No abdominal pain, paresthesias.  States his lower extremity edema is at his baseline.  He denies any shortness of breath, cough.  He is afebrile, nonseptic, not ill-appearing.  Heart and lungs clear.  Abdomen soft, nontender.  Patient with 3+ pitting edema to lower extremities.  Nontender.  Wiggles toes without difficulty.  Has chronic dyspnea on exertion, uses wheelchair for most activities at baseline.  He is not tachycardic, tachypneic or hypoxic on his home oxygen.  He has equal pulses bilaterally.  Patient reassessed.  He denies any chest pain or shortness of breath throughout his 6-hour ED stay.  He is pending his delta troponins.  Has had A. fib without RVR.  On Eliquis.  Patient reassessed.  He continues to deny chest pain, shortness of breath throughout his over 7-hour stay in the ED.  Patient would like to go home.  Denies any chest pain when EMS picked him up earlier today.  Given he has been without chest pain has negative delta troponins will DC home with close  outpatient follow-up.  I have low suspicion for acute ACS, PE, dissection, myocarditis, pericarditis.  Patient will continue to follow-up with home health and he sees twice daily for his IV Lasix.  He will have close follow-up with cardiology.  The patient has been appropriately medically screened and/or stabilized in the ED. I have low suspicion for any other emergent medical condition which would require further screening, evaluation or treatment in the ED or require inpatient management.  Patient is hemodynamically stable and in no acute distress.  Patient able to ambulate in department prior to ED.  Evaluation does not show acute pathology that would require ongoing or additional emergent interventions while in the emergency department or further inpatient treatment.  I have discussed the diagnosis with the patient and answered all questions.  Pain is been managed while in the emergency department and patient has no further complaints prior to discharge.  Patient is comfortable with plan discussed in room and is stable for discharge at this time.  I have discussed strict return precautions for returning to the emergency department.  Patient was encouraged to follow-up with PCP/specialist refer to at discharge.  Patient seen and evaluated by attending Dr. Regenia Skeeter who agrees with above treatment, plan and disposition.   MDM Rules/Calculators/A&P HEAR Score: 5   CHA2DS2-VASc Score: 7                Does have heart score of 5 just used to lead to risk factors however symptoms do not seem consistent with ACS, PE, dissection.  He has close follow-up with cardiology as well as home health twice daily.  We will have him keep close follow-up.  Reassuring delta troponins.  Mali VAsc 7 already on Eliquis   Final Clinical Impression(s) / ED Diagnoses Final diagnoses:  Precordial pain  Chronic edema    Rx / DC Orders ED Discharge Orders    None       Bowie Doiron A, PA-C 06/02/19 2110      Sherwood Gambler, MD 06/04/19 [phone]

## 2019-06-02 NOTE — ED Notes (Signed)
ptar here to return to [facility]

## 2019-06-02 NOTE — ED Triage Notes (Signed)
Pt to triage via [facility] from home.  Home health was at home today.  Pt receives 33m Lasix BID.  Reports chest tightness that radiates to neck.  Denies SOB.  Wears 2 liters O2 via [place] [place].  Chest pain has resolved now.  Reports neck pain at present.  Pt received ASA 3266m

## 2019-06-02 NOTE — Discharge Instructions (Signed)
Follow up with Cardiology.  Return for new or worsening symptoms

## 2019-06-02 NOTE — ED Notes (Signed)
Ptar called for pt 

## 2019-06-02 NOTE — ED Notes (Signed)
The pt reports that his iv was palced this am in [facility]  We are unsure if he gets iv meds we are sending him back just in the event that  He does get iv meds  No note about this  In earlier chart

## 2019-06-02 NOTE — ED Notes (Signed)
Report called to [facility]  They will have assistance ready

## 2019-06-04 ENCOUNTER — Telehealth: Payer: Self-pay | Admitting: Neurology

## 2019-06-04 ENCOUNTER — Other Ambulatory Visit: Payer: Self-pay | Admitting: Neurology

## 2019-06-04 MED ORDER — CYANOCOBALAMIN 1000 MCG/ML IJ SOLN: INTRAMUSCULAR | 14 refills | Status: DC

## 2019-06-04 NOTE — Telephone Encounter (Signed)
I prescribed would you also please  fax a copy of the prescription to [facility] thank you

## 2019-06-04 NOTE — Telephone Encounter (Signed)
Patient called stating he is set to receive his vitamin b12 shot this week but was advised by RN at [facility] if RN provides instructions they are able to give him his b12 shot

## 2019-06-05 ENCOUNTER — Other Ambulatory Visit: Payer: Self-pay | Admitting: Neurology

## 2019-06-05 ENCOUNTER — Telehealth: Payer: Self-pay

## 2019-06-05 MED ORDER — CYANOCOBALAMIN 1000 MCG/ML IJ SOLN: INTRAMUSCULAR | 14 refills | Status: DC

## 2019-06-05 NOTE — Telephone Encounter (Signed)
Received message that Merleen Nicely from [facility] called requesting a return call. Returned call and VM left.

## 2019-06-05 NOTE — Telephone Encounter (Signed)
I tried to reach the nurse line at [facility]. Mel Almond advised I try back again later.

## 2019-06-05 NOTE — Telephone Encounter (Signed)
I tried to reach [facility] nurse again. I LVM asking for call back.

## 2019-06-05 NOTE — Telephone Encounter (Signed)
Melvenia Beam, MD  05/31/2019 4:57 PM EDT    B12 deficiency. He should have B12 injections weekly for 4 weeks and then monthly. He can have it done here or pcp whichever is closer, let me know thanks

## 2019-06-05 NOTE — Telephone Encounter (Signed)
I was able to speak with Lesly Rubenstein, the pt's nurse at [facility]. She confirmed they can administer the B12 injections and stated they can fax the prescription to [facility] to have it filled. She provided her fax number [phone]). I faxed the B12 injection prescription to [facility]. Received a receipt of confirmation.

## 2019-06-12 ENCOUNTER — Encounter (HOSPITAL_COMMUNITY): Payer: Medicare HMO | Admitting: Cardiology

## 2019-06-17 NOTE — Progress Notes (Signed)
PCP: Roetta Sessions, NP Cardiology: Dr. Aundra Dubin  Reason for Visit:  Heart Failure + Pulmonary HTN    86 y.o. with history of interstitial lung disease (?NSIP vs UIP) on home oxygen, pulmonary hypertension, diastolic CHF, and prior non-Hodgkins lymphoma (pelvic mass, s/p XRT) presents for followup of CHF and pulmonary hypertension.  Patient has history of reportedly mild ILD (NSIP vs UIP) followed by a pulmonologist at [facility] and pulmonary hypertension thought to be out of proportion lung disease followed by a physician at [facility].  Last CT chest was in 11/19, showing stable ILD (?UIP vs NSIP).  He had RHC in 2/20 with moderate PAH, PVR 5.1 WU.  He was tried on Adcirca but developed cognitive problems and worsening of peripheral edema so this was stopped.  He has been on home oxygen, uses at night and with exertion.   He was admitted in 6/20 with worsening dyspnea and peripheral edema, weight up despite compliance with torsemide 40 mg bid at home.  He has CKD stage 3 and torsemide was decreased in the spring. He was diuresed aggressively with fall in weight.  Echo showed normal LV size and systolic function, RV reportedly normal.  6/20 RHC showed normal filling pressures with moderate pulmonary hypertension, PVR 5.35 WU.   After discharge from hospital, patient started on ambrisentan.  His weight steadily began to increase and he developed worsening lower extremity edema, we stopped ambrisentan.   Patient was admitted in 8/20 with AKI after taking metolazone for several days.  He was admitted again in 8/20 with acute on chronic diastolic CHF. He was diuresed.  Wandering atrial pacemaker noted.   He was admitted in 3/21 with suspected CVA, MRI head showed punctate occipital lobe infarct, thought to be due to small vessel disease. ILR was implanted.  Echo in 3/21 showed EF 60-65%, severe LVH, normal RV.  PYP scan in 3/21 was not suggestive of TTR amyloidosis.  Subsequently, ILR has shown  paroxysmal atrial fibrillation.   He was admitted later in 3/21 with AKI, creatinine up to 5.  However, he corrected rapidly to baseline with IVF and creatinine was 1.02 when discharge.   On 06/02/19 he was evaluated to [facility] for chest pain. HS Trop was negative. He was later discharged.   Today he returns for HF follow up. Last visit he was referred to [facility] for IV lasix x3 days then torsemide was increased to 80 mg twice a day. Overall feeling fine. Remains SOB with exertion. Continues to wear oxygen. Denies PND. Sleeps in a recliner. Not using CPAP because he is waiting on another piece of equipment.  Appetite fair. Eats lots of soup. No fever or chills. Weight at home 187  pounds. Taking all medications.  Mostly uses a motorized wheel chair to get around, and sometimes uses a walker around his apartment. Lives alone at [facility]. Followed by [facility].    Labs (6/20): K 4.7, creatinine 1.46 Labs (7/20): K 3.1, creatinine 1.67 Labs (8/20): K 3.9, creatinine 1.8 Labs (9/20): K 4.9, creatinine 1.76, BNP 78 Labs (10/20): K 4.2, creatininine 1.5 Labs (11/20): BNP 36, K 4.3, creatinine 1.4 Labs (3/21): myeloma panel negative, urine immunofixation negative.  Labs (4/21): K 4.5, creatinine 1.02, hgb 11.4 Labs (4/21): K 4.9, creatinine 1.66   PMH: 1. Non-Hodgkin's lymphoma: Pelvic involvement.  S/p radiation.  Thought to be in remission (Dr. Benay Spice).  2. HTN 3. PACs/PVCs 4. GERD: Barrett's esophagus.  5. 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  -  High resolution CT chest 11/19: ILD, ?UIP pattern.  6. Pulmonary hypertension: Thought to be out of proportion to underlying lung disease (ILD, ?NSIP vs UIP).   - RHC (2/20): mean RA 10, PASP 60 with mean 42, mean PCWP 11, CI 3, PVR 5.1 WU.  - Trial of Adcirca => failed due to ?memory worsening/cognitive affects and peripheral edema.  - Echo (6/20): EF 60-65%, mild LVH, RV reportedly normal, PASP 59 mmHg.  - RHC (6/20): mean RA 7, PA 50/24  mean 34, mean PCWP 12, CI 2.01, PAPI 3.7, PVR 5.35 WU - RF negative, ANA negative, anti-SCL70 negative, anti-centromere negative.  - V/Q scan (8/20): No evidence for chronic PE.  - Echo (3/21): EF 60-65%, severe LVH, normal RV.  7. CKD: Stage 3.   8. Wandering atrial pacemaker 9. OSA 10. PYP scan (3/21): H/CL ratio 1.0, grade 0.  11. CVA: MRI 3/21 with punctate occipital lobe infarct thought to be due to small vessel disease. - Carotid dopplers (3/21): 40-59% RICA stenosis.  12. Atrial fibrillation: Paroxysmal, noted on ILR.   Social History   Socioeconomic History  . Marital status: Married    Spouse name: Not on file  . Number of children: 2  . Years of education: Not on file  . Highest education level: Not on file  Occupational History  . 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 Smoking status: Former Smoker    Packs/day: 1.00    Years: 35.00    Pack years: 35.00    Types: Pipe, Cigarettes    Quit date: 02/23/1992    Years since quitting: 27.3  . Smokeless tobacco: Never Used  . Tobacco comment: quit 20 years ago  Substance and Sexual Activity  . Alcohol use: Not Currently    Comment: 2 drinks daily scotch  AND WINE WITH SUPPER  . Drug use: No  . Sexual activity: Not on file  Other Topics Concern  . Not on file  Social History Narrative   Married - second marriage   He has two children   Retired Horticulturist, commercial Professor   Currently teaches pottery making, 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[place]   Former Smoker quit 12 years ago- smoked for 35 years   Alcohol use- not currently   Right handed   Social Determinants of Radio broadcast assistant Strain:   . Difficulty of Paying Living Expenses:   Food Insecurity:   . Worried About Charity fundraiser in the Last Year:   . Arboriculturist in the Last Year:   Transportation Needs:   . Film/video editor (Medical):   Marland Kitchen Lack of Transportation (Non-Medical):   Physical Activity:     . Days of Exercise per Week:   . Minutes of Exercise per Session:   Stress:   . Feeling of Stress :   Social Connections:   . Frequency of Communication with Friends and Family:   . Frequency of Social Gatherings with Friends and Family:   . Attends Religious Services:   . Active Member of Clubs or Organizations:   . Attends Archivist Meetings:   Marland Kitchen Marital Status:   Intimate Partner Violence:   . Fear of Current or Ex-Partner:   . Emotionally Abused:   Marland Kitchen Physically Abused:   . Sexually Abused:    Family History  Problem Relation Age of Onset  . Heart disease Father  MI 45  . Urticaria Father   . Prostate cancer Brother   . Prostate cancer Paternal Uncle   . Prostate cancer Paternal Uncle   . Prostate cancer Paternal Uncle   . Hyperlipidemia Other   . Stroke Other   . Hypertension Other   . ADD / ADHD Other   . Colon cancer Neg Hx   . Allergic rhinitis Neg Hx   . Asthma Neg Hx   . Eczema Neg Hx    ROS: All systems reviewed and negative except as per HPI.   Current Outpatient Medications  Medication Sig Dispense Refill  . Acetaminophen (TYLENOL PO) Take by mouth. Takes with Aleve three times daily.    Marland Kitchen allopurinol (ZYLOPRIM) 100 MG tablet Take 50 mg by mouth daily.    Marland Kitchen apixaban (ELIQUIS) 2.5 MG TABS tablet Take 1 tablet (2.5 mg total) by mouth 2 (two) times daily. 60 tablet 5  . atorvastatin (LIPITOR) 40 MG tablet Take 1 tablet (40 mg total) by mouth daily at 6 PM. 30 tablet 1  . cyanocobalamin (,VITAMIN B-12,) 1000 MCG/ML injection One ml injection intramuscular once a week for 3 weeks and then monthly thereafter. 1 mL 14  . diltiazem (CARDIZEM CD) 120 MG 24 hr capsule Take 1 capsule (120 mg total) by mouth daily. 90 capsule 1  . escitalopram (LEXAPRO) 10 MG tablet Take 10 mg by mouth daily.    . finasteride (PROSCAR) 5 MG tablet Take 1 tablet (5 mg total) by mouth daily. (Patient taking differently: Take 5 mg by mouth at bedtime. ) 30 tablet 5  .  FLONASE SENSIMIST 27.5 MCG/SPRAY nasal spray Place 1 spray into the nose daily as needed for rhinitis or allergies.     Marland Kitchen ipratropium (ATROVENT) 0.03 % nasal spray Place 2 sprays into both nostrils 2 (two) times daily.    Marland Kitchen latanoprost (XALATAN) 0.005 % ophthalmic solution Place 1 drop into both eyes at bedtime.     . methocarbamol (ROBAXIN) 500 MG tablet Take 500-1,000 mg by mouth every 8 (eight) hours as needed for muscle spasms.    . Naproxen Sodium (ALEVE PO) Take by mouth. Takes with Tylenol three times daily    . omeprazole (PRILOSEC) 20 MG capsule Take 20 mg by mouth daily.    . potassium chloride (KLOR-CON) 10 MEQ tablet Take 10 mEq by mouth daily.    . Riociguat (ADEMPAS) 1.5 MG TABS Take 2 mg by mouth 3 (three) times daily. 90 tablet 11  . rOPINIRole (REQUIP) 2 MG tablet Take 1 tablet (2 mg total) by mouth in the morning, at noon, and at bedtime. 90 tablet 3  . Selexipag 1600 MCG TABS Take 1.6 mcg by mouth 2 (two) times daily. UPTRAVI    . tiZANidine (ZANAFLEX) 2 MG tablet Take 2 mg by mouth 2 (two) times daily as needed for muscle spasms.    Marland Kitchen torsemide (DEMADEX) 20 MG tablet Take 4 tablets (80 mg total) by mouth 2 (two) times daily. 120 tablet 5  . traMADol (ULTRAM) 50 MG tablet Take 50 mg by mouth every 6 (six) hours as needed for moderate pain.     . traZODone (DESYREL) 100 MG tablet Take 100 mg by mouth at bedtime.    Marland Kitchen spironolactone (ALDACTONE) 25 MG tablet Take 1 tablet (25 mg total) by mouth daily. 30 tablet 0   No current facility-administered medications for this encounter.   BP (!) 110/58   Pulse 78   Wt 85.2 kg (187 lb 12.8  oz)   SpO2 98% Comment: on 2L  BMI 29.20 kg/m   Wt Readings from Last 3 Encounters:  06/18/19 85.2 kg (187 lb 12.8 oz)  06/02/19 88.5 kg (195 lb)  05/31/19 88.5 kg (195 lb 3.2 oz)    General:  Arrived in a wheelchair on oxygen. No resp difficulty HEENT: normal Neck: supple. JVP 7-8 . Carotids 2+ bilat; no bruits. No lymphadenopathy or  thryomegaly appreciated. Cor: PMI nondisplaced. Regular rate & rhythm. No rubs, gallops or murmurs. Lungs: clear on 2 liters [place].  Abdomen: soft, nontender, nondistended. No hepatosplenomegaly. No bruits or masses. Good bowel sounds. Extremities: no cyanosis, clubbing, rash, L> R 1+ mid calf Neuro: alert & orientedx3, cranial nerves grossly intact. moves all 4 extremities w/o difficulty. Affect pleasant  Assessment/Plan: 1. Chronic diastolic CHF: Suspect with significant component of RV failure from pulmonary hypertension. Echo in 6/20 with EF 60-65%, RV not well-visualized but PASP 59 mmHg.  Echo (3/21) with EF 60-65%, severe LVH, normal RV, PASP 28 mmHg.  He has significant bilateral LEE on exam, also suspect he has underlying chronic venous insuffiencey as well. He previously has not tolerated compression stockings, but states the is he will to retry unna boots.  NYHA III. Volume status stable. Continue torsemide 80 mg bid   - Continue spironolactone 25 mg daily.  2. CKD Stage 3a: I reviewed recent BMET from 06/02/19. Stable.  3. Pulmonary hypertnesion: Patient thought to have [place] out of proportion to his lung disease (ILD, NSIP versus UIP). RHC in 2/20 showed mean PA pressure 41, PVR 5.1 WU, CI 3. Echo in 6/20 with RV reportedly ok =>I reviewed and do not think that RV was well-visualized. Patient did not tolerate Adcirca due to edema/?cognitive effects.  RHC was done again in 6/20, showed moderate pulmonary arterial hypertension with PVR 5.35 (comparable to prior).  Serological workup for PH was negative. Patient started ambrisentan 5 mg and developed significant edema/volume overload despite a high dose of torsemide. He is now on Uptravi + riociguat.  Most recent echo in 3/21 showed normal RV size/systolic function and PA systolic pressure estimate was normal.    - he will stay off ambrisentan (?trigger for volume overload).  - As above, he did not tolerate tadalafil.  - Continue Uptravi, now up  to 1600 mcg bid.  - Continue to titrate up riociguat. 4. Atrial fibrillation: Paroxysmal, noted on ILR.  - Regular on exam.  - Continue apixaban 2.5 mg bid (reduced dose given aged and SCr).  5. OSA: Continues to wait on all parts for CPAP. Suspect this will be helpful once he is able to start using.  6. CVA: In 3/21, may have been atrial fibrillation-related.  Starting apixaban per above give PAF detection on ILR.  7. Carotid stenosis: 40-59% RICA stenosis on 3/21 dopplers.  - Repeat carotid dopplers in 3/22.  8. Chronic Pain: Continues to struggle w/ chronic pain. Mainly w/ severe chronic low back pain, managed w/ opioids. He has been turned down from outpatient pain clinic due to being too high risk for chronic opioids due to pulmonary HTN.  Followed by Palliative Care. For now he wants to hold off on Hospice.  Greater than 50% of the (total minutes 30 ) visit spent in counseling/coordination of care regarding the above.    Ryan Hellmann NP-C  06/18/2019

## 2019-06-18 ENCOUNTER — Other Ambulatory Visit: Payer: Self-pay

## 2019-06-18 ENCOUNTER — Ambulatory Visit (HOSPITAL_COMMUNITY)
Admission: RE | Admit: 2019-06-18 | Discharge: 2019-06-18 | Disposition: A | Discharge status: Home / Self Care | Payer: Medicare HMO | Source: Ambulatory Visit | Attending: Internal Medicine | Admitting: Internal Medicine

## 2019-06-18 ENCOUNTER — Encounter (HOSPITAL_COMMUNITY): Payer: Self-pay

## 2019-06-18 VITALS — BP 110/58 | HR 78 | Wt 187.8 lb

## 2019-06-18 DIAGNOSIS — I5032 Chronic diastolic (congestive) heart failure: Secondary | ICD-10-CM | POA: Insufficient documentation

## 2019-06-18 DIAGNOSIS — R0683 Snoring: Secondary | ICD-10-CM

## 2019-06-18 DIAGNOSIS — Z9981 Dependence on supplemental oxygen: Secondary | ICD-10-CM | POA: Insufficient documentation

## 2019-06-18 DIAGNOSIS — G8929 Other chronic pain: Secondary | ICD-10-CM | POA: Insufficient documentation

## 2019-06-18 DIAGNOSIS — Z79899 Other long term (current) drug therapy: Secondary | ICD-10-CM | POA: Diagnosis not present

## 2019-06-18 DIAGNOSIS — G4733 Obstructive sleep apnea (adult) (pediatric): Secondary | ICD-10-CM | POA: Insufficient documentation

## 2019-06-18 DIAGNOSIS — I6521 Occlusion and stenosis of right carotid artery: Secondary | ICD-10-CM | POA: Diagnosis not present

## 2019-06-18 DIAGNOSIS — Z8249 Family history of ischemic heart disease and other diseases of the circulatory system: Secondary | ICD-10-CM | POA: Insufficient documentation

## 2019-06-18 DIAGNOSIS — Z8042 Family history of malignant neoplasm of prostate: Secondary | ICD-10-CM | POA: Diagnosis not present

## 2019-06-18 DIAGNOSIS — Z79891 Long term (current) use of opiate analgesic: Secondary | ICD-10-CM | POA: Insufficient documentation

## 2019-06-18 DIAGNOSIS — Z923 Personal history of irradiation: Secondary | ICD-10-CM | POA: Diagnosis not present

## 2019-06-18 DIAGNOSIS — Z8572 Personal history of non-Hodgkin lymphomas: Secondary | ICD-10-CM | POA: Diagnosis not present

## 2019-06-18 DIAGNOSIS — I480 Paroxysmal atrial fibrillation: Secondary | ICD-10-CM | POA: Insufficient documentation

## 2019-06-18 DIAGNOSIS — Z8349 Family history of other endocrine, nutritional and metabolic diseases: Secondary | ICD-10-CM | POA: Diagnosis not present

## 2019-06-18 DIAGNOSIS — I130 Hypertensive heart and chronic kidney disease with heart failure and stage 1 through stage 4 chronic kidney disease, or unspecified chronic kidney disease: Secondary | ICD-10-CM | POA: Insufficient documentation

## 2019-06-18 DIAGNOSIS — K219 Gastro-esophageal reflux disease without esophagitis: Secondary | ICD-10-CM | POA: Insufficient documentation

## 2019-06-18 DIAGNOSIS — Z87891 Personal history of nicotine dependence: Secondary | ICD-10-CM | POA: Diagnosis not present

## 2019-06-18 DIAGNOSIS — I498 Other specified cardiac arrhythmias: Secondary | ICD-10-CM | POA: Diagnosis not present

## 2019-06-18 DIAGNOSIS — N1831 Chronic kidney disease, stage 3a: Secondary | ICD-10-CM | POA: Insufficient documentation

## 2019-06-18 DIAGNOSIS — Z8673 Personal history of transient ischemic attack (TIA), and cerebral infarction without residual deficits: Secondary | ICD-10-CM | POA: Insufficient documentation

## 2019-06-18 DIAGNOSIS — I272 Pulmonary hypertension, unspecified: Secondary | ICD-10-CM | POA: Diagnosis not present

## 2019-06-18 DIAGNOSIS — Z7901 Long term (current) use of anticoagulants: Secondary | ICD-10-CM | POA: Insufficient documentation

## 2019-06-18 DIAGNOSIS — M545 Low back pain: Secondary | ICD-10-CM | POA: Diagnosis not present

## 2019-06-18 DIAGNOSIS — I5030 Unspecified diastolic (congestive) heart failure: Secondary | ICD-10-CM | POA: Diagnosis present

## 2019-06-18 NOTE — Patient Instructions (Addendum)
No medication changes today!  Your physician recommends that you schedule a follow-up appointment in: 3 months with Dr Aundra Dubin  Next appointment Monday August 23rd, 2021 at 11:20A [place] code [id number]  Please call office at [phone] option 2 if you have any questions or concerns.   At the [facility], you and your health needs are our priority. As part of our continuing mission to provide you with exceptional heart care, we have created designated Provider Care Teams. These Care Teams include your primary Cardiologist (physician) and Advanced Practice Providers (APPs- Physician Assistants and Nurse Practitioners) who all work together to provide you with the care you need, when you need it.   You may see any of the following providers on your designated Care Team at your next follow up: Marland Kitchen Dr Glori Bickers . Dr Loralie Champagne . Darrick Grinder, NP . Lyda Jester, PA . Audry Riles, PharmD   Please be sure to bring in all your medications bottles to every appointment.

## 2019-06-29 ENCOUNTER — Ambulatory Visit (INDEPENDENT_AMBULATORY_CARE_PROVIDER_SITE_OTHER): Payer: Medicare HMO | Admitting: *Deleted

## 2019-06-29 DIAGNOSIS — I639 Cerebral infarction, unspecified: Secondary | ICD-10-CM | POA: Diagnosis not present

## 2019-06-29 LAB — CUP PACEART REMOTE DEVICE CHECK
Date Time Interrogation Session: 20210527210234
Implantable Pulse Generator Implant Date: 20210325

## 2019-06-29 NOTE — Progress Notes (Signed)
Carelink Summary Report / Loop Recorder

## 2019-07-13 ENCOUNTER — Emergency Department (HOSPITAL_COMMUNITY)
Admission: EM | Admit: 2019-07-13 | Discharge: 2019-07-13 | Disposition: A | Discharge status: Home / Self Care | Attending: Emergency Medicine | Admitting: Emergency Medicine

## 2019-07-13 ENCOUNTER — Encounter (HOSPITAL_COMMUNITY): Payer: Self-pay | Admitting: Emergency Medicine

## 2019-07-13 ENCOUNTER — Other Ambulatory Visit: Payer: Self-pay

## 2019-07-13 DIAGNOSIS — E1122 Type 2 diabetes mellitus with diabetic chronic kidney disease: Secondary | ICD-10-CM | POA: Diagnosis not present

## 2019-07-13 DIAGNOSIS — I130 Hypertensive heart and chronic kidney disease with heart failure and stage 1 through stage 4 chronic kidney disease, or unspecified chronic kidney disease: Secondary | ICD-10-CM | POA: Diagnosis not present

## 2019-07-13 DIAGNOSIS — I509 Heart failure, unspecified: Secondary | ICD-10-CM | POA: Diagnosis not present

## 2019-07-13 DIAGNOSIS — J449 Chronic obstructive pulmonary disease, unspecified: Secondary | ICD-10-CM | POA: Insufficient documentation

## 2019-07-13 DIAGNOSIS — N183 Chronic kidney disease, stage 3 unspecified: Secondary | ICD-10-CM | POA: Insufficient documentation

## 2019-07-13 DIAGNOSIS — Z7984 Long term (current) use of oral hypoglycemic drugs: Secondary | ICD-10-CM | POA: Insufficient documentation

## 2019-07-13 DIAGNOSIS — N179 Acute kidney failure, unspecified: Secondary | ICD-10-CM | POA: Insufficient documentation

## 2019-07-13 DIAGNOSIS — K5900 Constipation, unspecified: Secondary | ICD-10-CM | POA: Diagnosis present

## 2019-07-13 DIAGNOSIS — R198 Other specified symptoms and signs involving the digestive system and abdomen: Secondary | ICD-10-CM

## 2019-07-13 DIAGNOSIS — Z87891 Personal history of nicotine dependence: Secondary | ICD-10-CM | POA: Diagnosis not present

## 2019-07-13 DIAGNOSIS — Z79899 Other long term (current) drug therapy: Secondary | ICD-10-CM | POA: Insufficient documentation

## 2019-07-13 LAB — COMPREHENSIVE METABOLIC PANEL
ALT: 6 U/L (ref 0–44)
AST: 11 U/L — ABNORMAL LOW (ref 15–41)
Albumin: 3.9 g/dL (ref 3.5–5.0)
Alkaline Phosphatase: 70 U/L (ref 38–126)
Anion gap: 10 (ref 5–15)
BUN: 73 mg/dL — ABNORMAL HIGH (ref 8–23)
CO2: 26 mmol/L (ref 22–32)
Calcium: 8.5 mg/dL — ABNORMAL LOW (ref 8.9–10.3)
Chloride: 101 mmol/L (ref 98–111)
Creatinine, Ser: 2.2 mg/dL — ABNORMAL HIGH (ref 0.61–1.24)
GFR calc Af Amer: 31 mL/min — ABNORMAL LOW (ref >60)
GFR calc non Af Amer: 26 mL/min — ABNORMAL LOW (ref >60)
Glucose, Bld: 109 mg/dL — ABNORMAL HIGH (ref 70–99)
Potassium: 5.1 mmol/L (ref 3.5–5.1)
Sodium: 137 mmol/L (ref 135–145)
Total Bilirubin: 0.8 mg/dL (ref 0.3–1.2)
Total Protein: 6.8 g/dL (ref 6.5–8.1)

## 2019-07-13 LAB — URINALYSIS, ROUTINE W REFLEX MICROSCOPIC
Bilirubin Urine: NEGATIVE
Glucose, UA: NEGATIVE mg/dL
Hgb urine dipstick: NEGATIVE
Ketones, ur: NEGATIVE mg/dL
Leukocytes,Ua: NEGATIVE
Nitrite: NEGATIVE
Protein, ur: NEGATIVE mg/dL
Specific Gravity, Urine: 1.008 (ref 1.005–1.030)
pH: 6 (ref 5.0–8.0)

## 2019-07-13 LAB — CBC
HCT: 35.2 % — ABNORMAL LOW (ref 39.0–52.0)
Hemoglobin: 10.4 g/dL — ABNORMAL LOW (ref 13.0–17.0)
MCH: 28.8 pg (ref 26.0–34.0)
MCHC: 29.5 g/dL — ABNORMAL LOW (ref 30.0–36.0)
MCV: 97.5 fL (ref 80.0–100.0)
Platelets: 291 10*3/uL (ref 150–400)
RBC: 3.61 MIL/uL — ABNORMAL LOW (ref 4.22–5.81)
RDW: 16.2 % — ABNORMAL HIGH (ref 11.5–15.5)
WBC: 7.2 10*3/uL (ref 4.0–10.5)
nRBC: 0 % (ref 0.0–0.2)

## 2019-07-13 LAB — LIPASE, BLOOD: Lipase: 33 U/L (ref 11–51)

## 2019-07-13 MED ORDER — CITRUCEL PO POWD
ORAL | 0 refills | Status: DC
Start: 2019-07-13 — End: 2020-10-23

## 2019-07-13 MED ORDER — SODIUM CHLORIDE 0.9% FLUSH: 3.0000 mL | Freq: Once | INTRAVENOUS | Status: DC

## 2019-07-13 MED ORDER — LACTATED RINGERS IV BOLUS
1000.0000 mL | Freq: Once | INTRAVENOUS | Status: AC
  Administered 2019-07-13: 1000 mL via INTRAVENOUS

## 2019-07-13 NOTE — ED Notes (Signed)
Patient verbalizes understanding of discharge instructions. Opportunity for questioning and answers were provided. Armband removed by staff, pt discharged from ED ambulatory.

## 2019-07-13 NOTE — Progress Notes (Signed)
Manufacturing engineer [facility]) hospital liaison note  Visited patient at bedside and spoke by phone with wife to update her on course in ED.  Pt denies n/v at this time, c/o chills and mild abdominal pain.  Bedside RN made aware.  Wife requests to be kept updated by hospital staff as plan develops.    Please do not hesitate to call with concerns or questions.  Domenic Moras, BSN, RN [place] (in [place]) [phone] (24 h on call)

## 2019-07-13 NOTE — ED Provider Notes (Signed)
[facility] EMERGENCY DEPARTMENT Provider Note   CSN: [id number] Arrival date & time: 07/13/19  1023     History Chief Complaint  Patient presents with  . Bloated  . Constipation    Ryan [name] is a 86 y.o. male.  HPI   87yM with irregular bowel habits. Ongoing for months. Sometimes long thing stool. Sometimes "explosive" but only small amounts of stool. Difficulty controlling bowels at times when trying to urinate. Reports he had x-rays yesterday and showed possible ileus. He denies n/v. No distension. No pain. Passed a small amount of stool earlier today.   Past Medical History:  Diagnosis Date  . Asthma   . Barrett's esophagus   . Benign prostatic hypertrophy   . Bilateral lower extremity edema   . CHF (congestive heart failure) ([place])   . Chronic fatigue   . COPD (chronic obstructive pulmonary disease) ([place])   . Diabetes mellitus type 2 in nonobese ([place]) 04/24/2019  . Diverticulosis of colon (without mention of hemorrhage)   . Esophageal reflux   . Gastritis   . Gluten intolerance   . Hiatal hernia 02/10/11   Noted on CT Scan - Moderate Hiatal Hernia  . Hyperlipemia   . Hypertension   . 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 Melanoma ([place])    lymphoma  . Microcytic anemia 04/20/2011   . 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 Osteoarthritis    hands/feet,knees, NECK, BACK  . Periaortic lymphadenopathy 02/16/2011  . Personal history of colonic polyps 2004   hyperplastic Dr. Collene Mares  . Pulmonary hyperinflation   . Renal cyst 02/10/11    Noted on CT Scan - Bilateral Renal Cysts  . S/P radiation therapy 03/15/11 - 04/09/11   Abdominal/ Pelvic Tumor, 3600 cGy/20 Fractions    Patient Active Problem List   Diagnosis Date Noted  . B12 deficiency 05/28/2019  . Cerebrovascular accident (CVA) due to embolism of precerebral artery ([place]) 05/28/2019   . Diabetic polyneuropathy associated with type 2 diabetes mellitus ([place]) 05/28/2019  . Acute CVA (cerebrovascular accident) ([place]) 04/24/2019  . Diabetes mellitus type 2 in nonobese ([place]) 04/24/2019  . Acute respiratory failure with hypoxia and hypercarbia ([place]) 09/20/2018  . CKD (chronic kidney disease) stage 3, GFR 30-59 ml/min 09/20/2018  . Lethargy 09/20/2018  . Acute metabolic encephalopathy 93/71/6967  . CHF (congestive heart failure) ([place]) 09/20/2018  . AKI (acute kidney injury) ([place]) 09/10/2018  . Pulmonary hypertension, unspecified ([place]) 09/10/2018  . Multifocal atrial tachycardia ([place]) 09/10/2018  . CHF exacerbation ([place]) 09/07/2018  . Leg pain 09/07/2018  . Hyperkalemia 07/26/2018  . Acute on chronic diastolic CHF (congestive heart failure) ([place])   . Bilateral lower extremity edema 02/02/2017  . Pulmonary fibrosis ([place]) 11/22/2016  . Blurry vision, bilateral 10/26/2016  . COPD ([place]) 07/05/2016  . Exertional dyspnea 09/10/2015  . Fatigue/malaise 08/19/2015  . Asthma/COPD 02/12/2015  . 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 Hypoxemia 01/05/2014  . Essential hypertension 01/05/2014  . Hypokalemia 01/05/2014  . Urinary retention 01/05/2014  . Hypoxia   . Lumbar radiculopathy 12/31/2013  . RLS (restless legs syndrome) 09/21/2012  . Chronic bilateral low back pain with bilateral sciatica 06/15/2012  . Abdominal pain, unspecified site 05/19/2012  . Unspecified deficiency anemia 04/07/2012  . Melanoma ([place])   . Non Hodgkin's lymphoma ([place])   . Periaortic lymphadenopathy 02/16/2011  . Barrett's esophagus 07/09/2010  .  Personal history of colonic polyps 05/28/2010  . History of IBS 05/28/2010  . Diverticulosis of colon (without mention of hemorrhage) 05/28/2010  . Arthritis involving multiple sites 05/28/2010  . Wheat intolerance 05/28/2010  . Chronic venous insufficiency 08/19/2009  . Peripheral edema 08/19/2009  . INSOMNIA, CHRONIC 12/24/2008  . EUSTACHIAN  TUBE DYSFUNCTION, BILATERAL 12/23/2008  . ERECTILE DYSFUNCTION, ORGANIC 08/01/2008  . Allergic rhinitis with a nonallergic component 05/20/2008  . VENTRAL HERNIA 02/12/2008  . PALPITATIONS 12/25/2007  . LACTOSE INTOLERANCE 10/17/2007  . Hyperlipidemia 09/14/2006  . BPH (benign prostatic hyperplasia) 09/14/2006  . OSTEOARTHRITIS 09/14/2006  . HEART MURMUR, HX OF 09/14/2006    Past Surgical History:  Procedure Laterality Date  . ARTHROSCOPIC REPAIR ACL     right  . BONE MARROW ASPIRATION  02/25/11   Bone Marrow, Aspirate, Clot, and Bilateral Bx, Right PIC  . CATARACT EXTRACTION, BILATERAL    . EYE SURGERY    . HERNIA REPAIR     LEFT INGUINAL   . INSERTION OF MESH N/A 08/23/2012   Procedure: INSERTION OF MESH;  Surgeon: Madilyn Hook, DO;  Location: [facility];  Service: General;  Laterality: N/A;  . LOOP RECORDER INSERTION N/A 04/26/2019   Procedure: LOOP RECORDER INSERTION;  Surgeon: Evans Lance, MD;  Location: [facility];  Service: Cardiovascular;  Laterality: N/A;  . LUMBAR LAMINECTOMY/DECOMPRESSION MICRODISCECTOMY Right 12/31/2013   Procedure: RIGHT L4-5 L5-S1 LAMINECTOMY;  Surgeon: Kristeen Miss, MD;  Location: [facility];  Service: Neurosurgery;  Laterality: Right;  RIGHT L4-5 L5-S1 LAMINECTOMY  . RIGHT HEART CATH N/A 07/25/2018   Procedure: RIGHT HEART CATH;  Surgeon: Larey Dresser, MD;  Location: [facility];  Service: Cardiovascular;  Laterality: N/A;  . ROTATOR CUFF REPAIR     right  . TONSILLECTOMY    . TONSILLECTOMY    . VENTRAL HERNIA REPAIR N/A 08/23/2012   Procedure: HERNIA REPAIR VENTRAL ADULT;  Surgeon: Madilyn Hook, DO;  Location: [facility];  Service: General;  Laterality: N/A;       Family History  Problem Relation Age of Onset  . Heart disease Father        MI 4  . Urticaria Father   . Prostate cancer Brother   . Prostate cancer Paternal Uncle   . Prostate cancer Paternal Uncle   . Prostate cancer Paternal Uncle   . Hyperlipidemia Other    . Stroke Other   . Hypertension Other   . ADD / ADHD Other   . Colon cancer Neg Hx   . Allergic rhinitis Neg Hx   . Asthma Neg Hx   . Eczema Neg Hx     Social History   Tobacco Use  . Smoking status: Former Smoker    Packs/day: 1.00    Years: 35.00    Pack years: 35.00    Types: Pipe, Cigarettes    Quit date: 02/23/1992    Years since quitting: 27.4  . Smokeless tobacco: Never Used  . Tobacco comment: quit 20 years ago  Vaping Use  . Vaping Use: Never used  Substance Use Topics  . Alcohol use: Not Currently    Comment: 2 drinks daily scotch  AND WINE WITH SUPPER  . Drug use: No    Home Medications Prior to Admission medications   Medication Sig Start Date End Date Taking? Authorizing Provider  Acetaminophen (TYLENOL PO) Take by mouth. Takes with Aleve three times daily.    [provider]  allopurinol (ZYLOPRIM) 100 MG tablet  Take 50 mg by mouth daily. 12/13/17   [provider]  apixaban (ELIQUIS) 2.5 MG TABS tablet Take 1 tablet (2.5 mg total) by mouth 2 (two) times daily. 05/17/19   Larey Dresser, MD  atorvastatin (LIPITOR) 40 MG tablet Take 1 tablet (40 mg total) by mouth daily at 6 PM. 04/27/19   Hosie Poisson, MD  cyanocobalamin (,VITAMIN B-12,) 1000 MCG/ML injection One ml injection intramuscular once a week for 3 weeks and then monthly thereafter. 06/05/19   Melvenia Beam, MD  diltiazem (CARDIZEM CD) 120 MG 24 hr capsule Take 1 capsule (120 mg total) by mouth daily. 05/04/19   Mercy Riding, MD  escitalopram (LEXAPRO) 10 MG tablet Take 10 mg by mouth daily.    [provider]  finasteride (PROSCAR) 5 MG tablet Take 1 tablet (5 mg total) by mouth daily. Patient taking differently: Take 5 mg by mouth at bedtime.  12/25/12   Norins, Heinz Knuckles, MD  FLONASE SENSIMIST 27.5 MCG/SPRAY nasal spray Place 1 spray into the nose daily as needed for rhinitis or allergies.  12/11/18   [provider]  ipratropium (ATROVENT) 0.03 % nasal spray  Place 2 sprays into both nostrils 2 (two) times daily. 02/05/19   [provider]  latanoprost (XALATAN) 0.005 % ophthalmic solution Place 1 drop into both eyes at bedtime.  01/20/18   [provider]  methocarbamol (ROBAXIN) 500 MG tablet Take 500-1,000 mg by mouth every 8 (eight) hours as needed for muscle spasms.    [provider]  Naproxen Sodium (ALEVE PO) Take by mouth. Takes with Tylenol three times daily    [provider]  omeprazole (PRILOSEC) 20 MG capsule Take 20 mg by mouth daily.    [provider]  potassium chloride (KLOR-CON) 10 MEQ tablet Take 10 mEq by mouth daily.    [provider]  Riociguat (ADEMPAS) 1.5 MG TABS Take 2 mg by mouth 3 (three) times daily. 05/16/19   Larey Dresser, MD  rOPINIRole (REQUIP) 2 MG tablet Take 1 tablet (2 mg total) by mouth in the morning, at noon, and at bedtime. 03/20/19   Kathrynn Ducking, MD  Selexipag 1600 MCG TABS Take 1.6 mcg by mouth 2 (two) times daily. UPTRAVI    [provider]  spironolactone (ALDACTONE) 25 MG tablet Take 1 tablet (25 mg total) by mouth daily. 09/27/18 05/28/19  Amin, Jeanella Flattery, MD  tiZANidine (ZANAFLEX) 2 MG tablet Take 2 mg by mouth 2 (two) times daily as needed for muscle spasms.    [provider]  torsemide (DEMADEX) 20 MG tablet Take 4 tablets (80 mg total) by mouth 2 (two) times daily. 05/31/19   Lyda Jester M, PA-C  traMADol (ULTRAM) 50 MG tablet Take 50 mg by mouth every 6 (six) hours as needed for moderate pain.     [provider]  traZODone (DESYREL) 100 MG tablet Take 100 mg by mouth at bedtime. 02/12/19   [provider]    Allergies    Pollen extract-tree extract and Molds & smuts  Review of Systems   Review of Systems All systems reviewed and negative, other than as noted in HPI.  Physical Exam Updated Vital Signs BP (!) 91/51   Pulse 85   Temp 98.6 F (37 C) (Oral)   Resp 20   Ht 5' 7.25" (1.708 m)    Wt 83 kg   SpO2 94%   BMI 28.45 kg/m   Physical Exam Vitals and  nursing note reviewed.  Constitutional:      General: He is not in acute distress.    Appearance: He is well-developed.  HENT:     Head: Normocephalic and atraumatic.  Eyes:     General:        Right eye: No discharge.        Left eye: No discharge.     Conjunctiva/sclera: Conjunctivae normal.  Cardiovascular:     Rate and Rhythm: Normal rate and regular rhythm.     Heart sounds: Normal heart sounds. No murmur heard.  No friction rub. No gallop.   Pulmonary:     Effort: Pulmonary effort is normal. No respiratory distress.     Breath sounds: Normal breath sounds.  Abdominal:     General: There is no distension.     Palpations: Abdomen is soft.     Tenderness: There is no abdominal tenderness.  Musculoskeletal:        General: No tenderness.     Cervical back: Neck supple.  Skin:    General: Skin is warm and dry.  Neurological:     Mental Status: He is alert.  Psychiatric:        Behavior: Behavior normal.        Thought Content: Thought content normal.     ED Results / Procedures / Treatments   Labs (all labs ordered are listed, but only abnormal results are displayed) Labs Reviewed  COMPREHENSIVE METABOLIC PANEL - Abnormal; Notable for the following components:      Result Value   Glucose, Bld 109 (*)    BUN 73 (*)    Creatinine, Ser 2.20 (*)    Calcium 8.5 (*)    AST 11 (*)    GFR calc non Af Amer 26 (*)    GFR calc Af Amer 31 (*)    All other components within normal limits  CBC - Abnormal; Notable for the following components:   RBC 3.61 (*)    Hemoglobin 10.4 (*)    HCT 35.2 (*)    MCHC 29.5 (*)    RDW 16.2 (*)    All other components within normal limits  URINALYSIS, ROUTINE W REFLEX MICROSCOPIC - Abnormal; Notable for the following components:   Color, Urine STRAW (*)    All other components within normal limits  LIPASE, BLOOD    EKG None  Radiology No results  found.  Procedures Procedures (including critical care time)  Medications Ordered in ED Medications  sodium chloride flush (NS) 0.9 % injection 3 mL (has no administration in time range)    ED Course  I have reviewed the triage vital signs and the nursing notes.  Pertinent labs & imaging results that were available during my care of the patient were reviewed by me and considered in my medical decision making (see chart for details).    MDM Rules/Calculators/A&P                         87yM with concern about his stools. Ongoing issue for months. No impaction. No obstructive symptoms. Will place on bulking agent to see if it helps.   Incidentally noted to have worsening renal function. Cr normal two months ago. Labs show steady worsening since the end of April. BP soft in ED. Ambulating w/o assistance or symptoms though. Given IVF in ED. Advised to hold torsemide/aldactone for two days and then resume at decreased dose. Need to have repeat BMP and follow-up  with PCP or cardiology next week.   Final Clinical Impression(s) / ED Diagnoses Final diagnoses:  Irregular bowel habits  AKI (acute kidney injury) [facility])    Rx / DC Orders ED Discharge Orders    None       Virgel Manifold, MD 07/15/19 1605

## 2019-07-13 NOTE — Discharge Instructions (Addendum)
Your kidney function has steadily declined since April. Stop demadex (torsemide) and spironolactone for 2 days. Restart on Monday at decreased dose of 40 mg of torsemide twice a day and 12.5 mg of spironolactone daily. You need to have repeat blood work (basic metabolic panel) next week and follow-up with your PCP or cardiologist.   You are being started on a stool bulking agent with the hope that it helps your regulate/control your bowel movements better.

## 2019-07-13 NOTE — ED Triage Notes (Signed)
Patient arrives to ED with complaints of abdominal bloating, generalized pain, constipation for the last couple of months. Patient states he been having a lot of gas recently. Had an abdominal xray yesterday which showed his bowels were full and not moving.

## 2019-07-13 NOTE — ED Notes (Signed)
Pt placed on 3lpm [place] at pts request. Pt on home o2

## 2019-08-01 ENCOUNTER — Ambulatory Visit (INDEPENDENT_AMBULATORY_CARE_PROVIDER_SITE_OTHER): Admitting: *Deleted

## 2019-08-01 DIAGNOSIS — I639 Cerebral infarction, unspecified: Secondary | ICD-10-CM | POA: Diagnosis not present

## 2019-08-01 LAB — CUP PACEART REMOTE DEVICE CHECK
Date Time Interrogation Session: 20210629230242
Implantable Pulse Generator Implant Date: 20210325

## 2019-08-02 NOTE — Progress Notes (Signed)
Carelink Summary Report / Loop Recorder

## 2019-08-13 ENCOUNTER — Encounter: Payer: Self-pay | Admitting: Nurse Practitioner

## 2019-08-23 ENCOUNTER — Other Ambulatory Visit: Payer: Self-pay | Admitting: Neurology

## 2019-08-24 ENCOUNTER — Other Ambulatory Visit: Payer: Self-pay | Admitting: Neurology

## 2019-08-24 NOTE — Telephone Encounter (Signed)
Pt is needing a refill on his rOPINIRole (REQUIP) 2 MG tablet sent in to the [facility], [place] - [address]

## 2019-08-26 ENCOUNTER — Other Ambulatory Visit: Payer: Self-pay | Admitting: Neurology

## 2019-08-26 MED ORDER — ROPINIROLE HCL 2 MG PO TABS: 2.0000 mg | ORAL_TABLET | Freq: Three times a day (TID) | ORAL | 3 refills | Status: DC

## 2019-09-03 ENCOUNTER — Ambulatory Visit (INDEPENDENT_AMBULATORY_CARE_PROVIDER_SITE_OTHER): Payer: Medicare HMO | Admitting: *Deleted

## 2019-09-03 DIAGNOSIS — I639 Cerebral infarction, unspecified: Secondary | ICD-10-CM | POA: Diagnosis not present

## 2019-09-03 LAB — CUP PACEART REMOTE DEVICE CHECK
Date Time Interrogation Session: 20210801230035
Implantable Pulse Generator Implant Date: 20210325

## 2019-09-05 NOTE — Progress Notes (Signed)
Carelink Summary Report / Loop Recorder

## 2019-09-06 ENCOUNTER — Ambulatory Visit: Payer: Medicare HMO | Admitting: Nurse Practitioner

## 2019-09-24 ENCOUNTER — Ambulatory Visit (HOSPITAL_COMMUNITY)
Admission: RE | Admit: 2019-09-24 | Discharge: 2019-09-24 | Disposition: A | Discharge status: Home / Self Care | Payer: Medicare HMO | Source: Ambulatory Visit | Attending: Cardiology | Admitting: Cardiology

## 2019-09-24 ENCOUNTER — Other Ambulatory Visit: Payer: Self-pay

## 2019-09-24 VITALS — BP 110/70 | HR 78 | Wt 173.0 lb

## 2019-09-24 DIAGNOSIS — I272 Pulmonary hypertension, unspecified: Secondary | ICD-10-CM | POA: Diagnosis present

## 2019-09-24 DIAGNOSIS — Z8572 Personal history of non-Hodgkin lymphomas: Secondary | ICD-10-CM | POA: Insufficient documentation

## 2019-09-24 DIAGNOSIS — I480 Paroxysmal atrial fibrillation: Secondary | ICD-10-CM | POA: Diagnosis not present

## 2019-09-24 DIAGNOSIS — Z79899 Other long term (current) drug therapy: Secondary | ICD-10-CM | POA: Diagnosis not present

## 2019-09-24 DIAGNOSIS — Z7901 Long term (current) use of anticoagulants: Secondary | ICD-10-CM | POA: Insufficient documentation

## 2019-09-24 DIAGNOSIS — I4510 Unspecified right bundle-branch block: Secondary | ICD-10-CM | POA: Insufficient documentation

## 2019-09-24 DIAGNOSIS — G4733 Obstructive sleep apnea (adult) (pediatric): Secondary | ICD-10-CM | POA: Diagnosis not present

## 2019-09-24 DIAGNOSIS — J8410 Pulmonary fibrosis, unspecified: Secondary | ICD-10-CM | POA: Insufficient documentation

## 2019-09-24 DIAGNOSIS — I5032 Chronic diastolic (congestive) heart failure: Secondary | ICD-10-CM | POA: Diagnosis not present

## 2019-09-24 DIAGNOSIS — Z950 Presence of cardiac pacemaker: Secondary | ICD-10-CM | POA: Insufficient documentation

## 2019-09-24 DIAGNOSIS — Z9981 Dependence on supplemental oxygen: Secondary | ICD-10-CM | POA: Insufficient documentation

## 2019-09-24 DIAGNOSIS — K219 Gastro-esophageal reflux disease without esophagitis: Secondary | ICD-10-CM | POA: Diagnosis not present

## 2019-09-24 DIAGNOSIS — Z8673 Personal history of transient ischemic attack (TIA), and cerebral infarction without residual deficits: Secondary | ICD-10-CM | POA: Insufficient documentation

## 2019-09-24 DIAGNOSIS — Z8249 Family history of ischemic heart disease and other diseases of the circulatory system: Secondary | ICD-10-CM | POA: Insufficient documentation

## 2019-09-24 DIAGNOSIS — N183 Chronic kidney disease, stage 3 unspecified: Secondary | ICD-10-CM | POA: Insufficient documentation

## 2019-09-24 DIAGNOSIS — I130 Hypertensive heart and chronic kidney disease with heart failure and stage 1 through stage 4 chronic kidney disease, or unspecified chronic kidney disease: Secondary | ICD-10-CM | POA: Diagnosis not present

## 2019-09-24 DIAGNOSIS — J849 Interstitial pulmonary disease, unspecified: Secondary | ICD-10-CM

## 2019-09-24 DIAGNOSIS — Z87891 Personal history of nicotine dependence: Secondary | ICD-10-CM | POA: Insufficient documentation

## 2019-09-24 DIAGNOSIS — E785 Hyperlipidemia, unspecified: Secondary | ICD-10-CM

## 2019-09-24 DIAGNOSIS — E739 Lactose intolerance, unspecified: Secondary | ICD-10-CM | POA: Diagnosis not present

## 2019-09-24 DIAGNOSIS — I491 Atrial premature depolarization: Secondary | ICD-10-CM | POA: Diagnosis not present

## 2019-09-24 LAB — LIPID PANEL
Cholesterol: 159 mg/dL (ref 0–200)
HDL: 45 mg/dL (ref >40)
LDL Cholesterol: 82 mg/dL (ref 0–99)
Total CHOL/HDL Ratio: 3.5 RATIO
Triglycerides: 159 mg/dL — ABNORMAL HIGH (ref <150)
VLDL: 32 mg/dL (ref 0–40)

## 2019-09-24 LAB — BASIC METABOLIC PANEL
Anion gap: 12 (ref 5–15)
BUN: 83 mg/dL — ABNORMAL HIGH (ref 8–23)
CO2: 21 mmol/L — ABNORMAL LOW (ref 22–32)
Calcium: 8.5 mg/dL — ABNORMAL LOW (ref 8.9–10.3)
Chloride: 102 mmol/L (ref 98–111)
Creatinine, Ser: 2.21 mg/dL — ABNORMAL HIGH (ref 0.61–1.24)
GFR calc Af Amer: 30 mL/min — ABNORMAL LOW (ref >60)
GFR calc non Af Amer: 26 mL/min — ABNORMAL LOW (ref >60)
Glucose, Bld: 97 mg/dL (ref 70–99)
Potassium: 5 mmol/L (ref 3.5–5.1)
Sodium: 135 mmol/L (ref 135–145)

## 2019-09-24 NOTE — Progress Notes (Signed)
PCP: Roetta Sessions, NP Cardiology: Dr. Aundra Dubin  86 y.o. with history of interstitial lung disease (?NSIP vs UIP) on home oxygen, pulmonary hypertension, diastolic CHF, and prior non-Hodgkins lymphoma (pelvic mass, s/p XRT) presents for followup of CHF and pulmonary hypertension.  Patient has history of reportedly mild ILD (NSIP vs UIP) followed by a pulmonologist at [facility] and pulmonary hypertension thought to be out of proportion lung disease followed by a physician at [facility].  Last CT chest was in 11/19, showing stable ILD (?UIP vs NSIP).  He had RHC in 2/20 with moderate PAH, PVR 5.1 WU.  He was tried on Adcirca but developed cognitive problems and worsening of peripheral edema so this was stopped.  He has been on home oxygen, uses at night and with exertion.   He was admitted in 6/20 with worsening dyspnea and peripheral edema, weight up despite compliance with torsemide 40 mg bid at home.  He has CKD stage 3 and torsemide was decreased in the spring. He was diuresed aggressively with fall in weight.  Echo showed normal LV size and systolic function, RV reportedly normal.  6/20 RHC showed normal filling pressures with moderate pulmonary hypertension, PVR 5.35 WU.   After discharge from hospital, patient started on ambrisentan.  His weight steadily began to increase and he developed worsening lower extremity edema, we stopped ambrisentan.   Patient was admitted in 8/20 with AKI after taking metolazone for several days.  He was admitted again in 8/20 with acute on chronic diastolic CHF. He was diuresed.  Wandering atrial pacemaker noted.   He was admitted in 3/21 with suspected CVA, MRI head showed punctate occipital lobe infarct, thought to be due to small vessel disease. ILR was implanted.  Echo in 3/21 showed EF 60-65%, severe LVH, normal RV.  PYP scan in 3/21 was not suggestive of TTR amyloidosis.  Subsequently, ILR has shown paroxysmal atrial fibrillation.   He was admitted later in  3/21 with AKI, creatinine up to 5.  However, he corrected rapidly to baseline with IVF and creatinine was 1.02 when discharge.   He returns for followup of CHF and pulmonary hypertension.  He seems to be doing better overall.  Still short of breath after walking 100-200 feet, but this is getting better.  He walked in today with walker and did not use wheelchair. He remains on Adempas and selexipag.  He is using CPAP.  No chest pain.  No orthopnea/PND.  Still with ankle edema but this is also improved.  Weight is down 14 lbs.  He feels like his memory and thinking are better.  ECG (personally reviewed): NSR, iRBBB  6 minute walk (8/21): 122 m  Labs (6/20): K 4.7, creatinine 1.46 Labs (7/20): K 3.1, creatinine 1.67 Labs (8/20): K 3.9, creatinine 1.8 Labs (9/20): K 4.9, creatinine 1.76, BNP 78 Labs (10/20): K 4.2, creatininine 1.5 Labs (11/20): BNP 36, K 4.3, creatinine 1.4 Labs (3/21): myeloma panel negative, urine immunofixation negative.  Labs (4/21): K 4.5, creatinine 1.02, hgb 11.4 Labs (6/21): K 5.1, creatinine 2.2, hgb 10.4  PMH: 1. Non-Hodgkin's lymphoma: Pelvic involvement.  S/p radiation.  Thought to be in remission (Dr. Benay Spice).  2. HTN 3. PACs/PVCs 4. GERD: Barrett's esophagus.  5. 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  - High resolution CT chest 11/19: ILD, ?UIP pattern.  6. Pulmonary hypertension: Thought to be out of proportion to underlying lung disease (ILD, ?NSIP vs UIP).   - RHC (2/20): mean RA 10, PASP 60 with mean 42,  mean PCWP 11, CI 3, PVR 5.1 WU.  - Trial of Adcirca => failed due to ?memory worsening/cognitive affects and peripheral edema.  - Echo (6/20): EF 60-65%, mild LVH, RV reportedly normal, PASP 59 mmHg.  - RHC (6/20): mean RA 7, PA 50/24 mean 34, mean PCWP 12, CI 2.01, PAPI 3.7, PVR 5.35 WU - RF negative, ANA negative, anti-SCL70 negative, anti-centromere negative.  - V/Q scan (8/20): No evidence for chronic PE.  - Echo (3/21): EF 60-65%, severe LVH, normal  RV.  7. CKD: Stage 3.   8. Wandering atrial pacemaker 9. OSA 10. PYP scan (3/21): H/CL ratio 1.0, grade 0.  11. CVA: MRI 3/21 with punctate occipital lobe infarct thought to be due to small vessel disease. - Carotid dopplers (3/21): 40-59% RICA stenosis.  12. Atrial fibrillation: Paroxysmal, noted on ILR.   Social History   Socioeconomic History  . Marital status: Married    Spouse name: Not on file  . Number of children: 2  . Years of education: Not on file  . Highest education level: Not on file  Occupational History  . 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 Smoking status: Former Smoker    Packs/day: 1.00    Years: 35.00    Pack years: 35.00    Types: Pipe, Cigarettes    Quit date: 02/23/1992    Years since quitting: 27.6  . Smokeless tobacco: Never Used  . Tobacco comment: quit 20 years ago  Vaping Use  . Vaping Use: Never used  Substance and Sexual Activity  . Alcohol use: Not Currently    Comment: 2 drinks daily scotch  AND WINE WITH SUPPER  . Drug use: No  . Sexual activity: Not on file  Other Topics Concern  . 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 Difficulty of Paying Living Expenses: Not on file  Food Insecurity:   . Worried About Charity fundraiser in the Last Year: Not on file  . Ran Out of Food in the Last Year: Not on file  Transportation Needs:   . Lack of Transportation (Medical): Not on file  . Lack of Transportation (Non-Medical): Not on file  Physical Activity:   . Days of Exercise per Week: Not on file  . Minutes of Exercise per Session: Not on file  Stress:   . Feeling of Stress : Not  on file  Social Connections:   . Frequency of Communication with Friends and Family: Not on file  . Frequency of Social Gatherings with Friends and Family: Not on file  . Attends Religious Services: Not on file  . Active Member of Clubs or Organizations: Not on file  . Attends Archivist Meetings: Not on file  . Marital Status: Not on file  Intimate Partner Violence:   . Fear of Current or Ex-Partner: Not on file  . Emotionally Abused: Not on file  . Physically Abused: Not on file  . Sexually Abused: Not on file   Family History  Problem Relation Age of Onset  . Heart disease Father  MI 76  . Urticaria Father   . Prostate cancer Brother   . Prostate cancer Paternal Uncle   . Prostate cancer Paternal Uncle   . Prostate cancer Paternal Uncle   . Hyperlipidemia Other   . Stroke Other   . Hypertension Other   . ADD / ADHD Other   . Colon cancer Neg Hx   . Allergic rhinitis Neg Hx   . Asthma Neg Hx   . Eczema Neg Hx    ROS: All systems reviewed and negative except as per HPI.   Current Outpatient Medications  Medication Sig Dispense Refill  . Acetaminophen (TYLENOL PO) Take by mouth. Takes with Aleve three times daily.    Marland Kitchen allopurinol (ZYLOPRIM) 100 MG tablet Take 50 mg by mouth daily.    Marland Kitchen apixaban (ELIQUIS) 2.5 MG TABS tablet Take 1 tablet (2.5 mg total) by mouth 2 (two) times daily. 60 tablet 5  . atorvastatin (LIPITOR) 40 MG tablet Take 1 tablet (40 mg total) by mouth daily at 6 PM. 30 tablet 1  . cyanocobalamin (,VITAMIN B-12,) 1000 MCG/ML injection One ml injection intramuscular once a week for 3 weeks and then monthly thereafter. 1 mL 14  . diltiazem (CARDIZEM CD) 120 MG 24 hr capsule Take 1 capsule (120 mg total) by mouth daily. 90 capsule 1  . escitalopram (LEXAPRO) 10 MG tablet Take 10 mg by mouth daily.    . finasteride (PROSCAR) 5 MG tablet Take 5 mg by mouth daily.    Marland Kitchen FLONASE SENSIMIST 27.5 MCG/SPRAY nasal spray Place 1 spray into the nose daily  as needed for rhinitis or allergies.     Marland Kitchen ipratropium (ATROVENT) 0.03 % nasal spray Place 2 sprays into both nostrils 2 (two) times daily.    Marland Kitchen latanoprost (XALATAN) 0.005 % ophthalmic solution Place 1 drop into both eyes at bedtime.     Marland Kitchen linaclotide (LINZESS) 290 MCG CAPS capsule Take 290 mcg by mouth daily before breakfast.    . methocarbamol (ROBAXIN) 500 MG tablet Take 500-1,000 mg by mouth every 8 (eight) hours as needed for muscle spasms.    . methylcellulose (CITRUCEL) oral powder Mix 2 grams in 8 ounces of water and drink twice a day. 454 g 0  . Naproxen Sodium (ALEVE PO) Take by mouth. Takes with Tylenol three times daily    . omeprazole (PRILOSEC) 20 MG capsule Take 20 mg by mouth daily.    . potassium chloride (KLOR-CON) 10 MEQ tablet Take 10 mEq by mouth daily.    . Riociguat (ADEMPAS) 2.5 MG TABS Take 2.5 mg by mouth 3 (three) times daily.    Marland Kitchen rOPINIRole (REQUIP) 2 MG tablet Take 1 tablet (2 mg total) by mouth in the morning, at noon, and at bedtime. 90 tablet 3  . Selexipag 1600 MCG TABS Take 1.6 mcg by mouth 2 (two) times daily. UPTRAVI    . spironolactone (ALDACTONE) 25 MG tablet Take 25 mg by mouth daily.    Marland Kitchen tiZANidine (ZANAFLEX) 2 MG tablet Take 2 mg by mouth 2 (two) times daily as needed for muscle spasms.    Marland Kitchen torsemide (DEMADEX) 20 MG tablet Take 4 tablets (80 mg total) by mouth 2 (two) times daily. 120 tablet 5  . traMADol (ULTRAM) 50 MG tablet Take 50 mg by mouth every 6 (six) hours as needed for moderate pain.     . traZODone (DESYREL) 100 MG tablet Take 100 mg by mouth at bedtime.     No current facility-administered medications  for this encounter.   BP 110/70   Pulse 78   Wt 78.5 kg (173 lb)   SpO2 99%   BMI 26.89 kg/m  General: NAD Neck: No JVD, no thyromegaly or thyroid nodule.  Lungs: Clear to auscultation bilaterally with normal respiratory effort. CV: Nondisplaced PMI.  Heart regular S1/S2, no S3/S4, no murmur.  2+ ankle edema.  No carotid bruit.   Normal pedal pulses.  Abdomen: Soft, nontender, no hepatosplenomegaly, no distention.  Skin: Intact without lesions or rashes.  Neurologic: Alert and oriented x 3.  Psych: Normal affect. Extremities: No clubbing or cyanosis.  HEENT: Normal.   Assessment/Plan: 1. Chronic diastolic CHF: Suspect with significant component of RV failure from pulmonary hypertension. Echo in 6/20 with EF 60-65%, RV not well-visualized but PASP 59 mmHg.  Echo (3/21) with EF 60-65%, severe LVH, normal RV, PASP 28 mmHg.  NYHA class III symptoms but improved.  No JVD though he still has some peripheral edema.  - He will try to wear compression stockings.      - Continue torsemide 80 mg bid but need BMET today given elevated creatinine in 6/21.   - Continue spironolactone 25 mg daily pending BMET today.  2. CKD Stage 3:  Recent AKI.  Check BMET today.  3. Pulmonary hypertension: Patient thought to have [place] out of proportion to his lung disease (ILD, NSIP versus UIP). RHC in 2/20 showed mean PA pressure 41, PVR 5.1 WU, CI 3. Echo in 6/20 with RV reportedly ok =>I reviewed and do not think that RV was well-visualized. Patient did not tolerate Adcirca due to edema/?cognitive effects.  RHC was done again in 6/20, showed moderate pulmonary arterial hypertension with PVR 5.35 (comparable to prior).  Serological workup for PH was negative. Patient started ambrisentan 5 mg and developed significant edema/volume overload despite a high dose of torsemide. He is now on Uptravi + riociguat.  Most recent echo in 3/21 showed normal RV size/systolic function and PA systolic pressure estimate was normal.  Overall, symptoms are improved.  - he will stay off ambrisentan (?trigger for volume overload).  - As above, he did not tolerate tadalafil.  - Continue Uptravi, now up to 1600 mcg bid.  - Continue riociguat at goal dose 2.5 mg tid.  4. Atrial fibrillation: Paroxysmal, noted on ILR. NSR today.  - Continue apixaban 2.5 mg bid (dosed for  age, renal dysfunction).  5. OSA: Continue CPAP.  6. CVA: In 3/21, may have been atrial fibrillation-related.  On apixaban now.  7. Carotid stenosis: 40-59% RICA stenosis on 3/21 dopplers.  - Repeat carotid dopplers in 3/22.  8. Interstitial lung disease: Needs pulmonary followup, will refer.   Followup 3 months.   Loralie Champagne 09/24/2019

## 2019-09-24 NOTE — Progress Notes (Signed)
6 Min Walk Test Completed  Pt ambulated 121.92 meters O2 Sat ranged 88-90 on 0L oxygen HR ranged 80-84  Patient was only able to walk for 4 minutes

## 2019-09-24 NOTE — Patient Instructions (Signed)
Continue current medications  Labs done today, your results will be available in MyChart, we will contact you for abnormal readings.  Please wear your compression hose daily, place them on as soon as you get up in the morning and remove before you go to bed at night.  You have been referred to [facility], they will call you for an appointment  Your physician recommends that you schedule a follow-up appointment in: 3 months  If you have any questions or concerns before your next appointment please send [place] a message through [facility] or call our office at [phone].    TO LEAVE A MESSAGE FOR THE NURSE SELECT OPTION 2, PLEASE LEAVE A MESSAGE INCLUDING: . YOUR NAME . DATE OF BIRTH . CALL BACK NUMBER . REASON FOR CALL**this is important as we prioritize the call backs  [place] AS LONG AS YOU CALL BEFORE 4:00 PM  At the [facility], you and your health needs are our priority. As part of our continuing mission to provide you with exceptional heart care, we have created designated Provider Care Teams. These Care Teams include your primary Cardiologist (physician) and Advanced Practice Providers (APPs- Physician Assistants and Nurse Practitioners) who all work together to provide you with the care you need, when you need it.   You may see any of the following providers on your designated Care Team at your next follow up: Marland Kitchen Dr Glori Bickers . Dr Loralie Champagne . Darrick Grinder, NP . Lyda Jester, PA . Audry Riles, PharmD   Please be sure to bring in all your medications bottles to every appointment.

## 2019-09-26 ENCOUNTER — Telehealth: Payer: Self-pay | Admitting: Neurology

## 2019-09-26 ENCOUNTER — Telehealth (HOSPITAL_COMMUNITY): Payer: Self-pay

## 2019-09-26 MED ORDER — SPIRONOLACTONE 25 MG PO TABS: 12.5000 mg | ORAL_TABLET | Freq: Every day | ORAL | 3 refills | Status: DC

## 2019-09-26 NOTE — Telephone Encounter (Signed)
I called patient to ask him if he would like to have a virtual visit or reschedule in the office in a few months due to the increase in Covid infections.  Patient stated he really did not feel the need to come in anyway, he is stable with no issues, and he would call us if he needed to return to the office.  He is doing well, no issues, stable.  Please cancel appointment.

## 2019-09-26 NOTE — Telephone Encounter (Signed)
Malena Edman, RN  09/26/2019 2:06 PM EDT Back to Top    Advised pt to decrease spiro to 12.80m daily. Pt scheduled to repeat BMET next week. Pt was agreeable and verbalized understanding    AMalena Edman RN  09/25/2019 10:30 AM EDT     Left message to call back   DLarey Dresser MD

## 2019-09-26 NOTE — Telephone Encounter (Signed)
-----  Message from Larey Dresser, MD sent at 09/24/2019 11:08 PM EDT ----- Decrease spironolactone to 12.5 mg daily, BMET in 1 week.

## 2019-09-26 NOTE — Addendum Note (Signed)
Addended by: Malena Edman on: 09/26/2019 02:13 PM   Modules accepted: Orders

## 2019-09-27 ENCOUNTER — Ambulatory Visit: Payer: Medicare HMO | Admitting: Neurology

## 2019-09-27 NOTE — Telephone Encounter (Signed)
Good news! Appt for today has been canceled.

## 2019-09-28 ENCOUNTER — Telehealth (HOSPITAL_COMMUNITY): Payer: Self-pay | Admitting: Cardiology

## 2019-09-28 NOTE — Telephone Encounter (Signed)
Nurse  From [place] [facility] request order faxed from DM regarding new medication change to cut meds in half or the dosage. Please fax to [phone]. She stated she can't give the medication without an order

## 2019-09-28 NOTE — Telephone Encounter (Signed)
Order faxed.

## 2019-10-01 ENCOUNTER — Other Ambulatory Visit (HOSPITAL_COMMUNITY): Payer: Self-pay

## 2019-10-01 DIAGNOSIS — I5032 Chronic diastolic (congestive) heart failure: Secondary | ICD-10-CM

## 2019-10-01 NOTE — Progress Notes (Signed)
Orders Placed This Encounter  Procedures  . Basic Metabolic Panel (BMET)    Standing Status:   Future    Standing Expiration Date:   09/30/2020    Order Specific Question:   Release to patient    Answer:   Immediate

## 2019-10-02 ENCOUNTER — Other Ambulatory Visit: Payer: Self-pay

## 2019-10-02 ENCOUNTER — Ambulatory Visit (HOSPITAL_COMMUNITY)
Admission: RE | Admit: 2019-10-02 | Discharge: 2019-10-02 | Disposition: A | Discharge status: Home / Self Care | Payer: Medicare HMO | Source: Ambulatory Visit | Attending: Internal Medicine | Admitting: Internal Medicine

## 2019-10-02 ENCOUNTER — Other Ambulatory Visit (HOSPITAL_COMMUNITY): Payer: Self-pay | Admitting: Cardiology

## 2019-10-02 DIAGNOSIS — I5032 Chronic diastolic (congestive) heart failure: Secondary | ICD-10-CM | POA: Insufficient documentation

## 2019-10-02 LAB — BASIC METABOLIC PANEL
Anion gap: 12 (ref 5–15)
BUN: 70 mg/dL — ABNORMAL HIGH (ref 8–23)
CO2: 25 mmol/L (ref 22–32)
Calcium: 8.2 mg/dL — ABNORMAL LOW (ref 8.9–10.3)
Chloride: 101 mmol/L (ref 98–111)
Creatinine, Ser: 2.01 mg/dL — ABNORMAL HIGH (ref 0.61–1.24)
GFR calc Af Amer: 34 mL/min — ABNORMAL LOW (ref >60)
GFR calc non Af Amer: 29 mL/min — ABNORMAL LOW (ref >60)
Glucose, Bld: 86 mg/dL (ref 70–99)
Potassium: 4.8 mmol/L (ref 3.5–5.1)
Sodium: 138 mmol/L (ref 135–145)

## 2019-10-07 LAB — CUP PACEART REMOTE DEVICE CHECK
Date Time Interrogation Session: 20210903230457
Implantable Pulse Generator Implant Date: 20210325

## 2019-10-09 ENCOUNTER — Ambulatory Visit (INDEPENDENT_AMBULATORY_CARE_PROVIDER_SITE_OTHER): Payer: Medicare HMO | Admitting: *Deleted

## 2019-10-09 DIAGNOSIS — I639 Cerebral infarction, unspecified: Secondary | ICD-10-CM | POA: Diagnosis not present

## 2019-10-10 ENCOUNTER — Other Ambulatory Visit (HOSPITAL_COMMUNITY): Payer: Self-pay

## 2019-10-10 MED ORDER — POTASSIUM CHLORIDE ER 10 MEQ PO TBCR
EXTENDED_RELEASE_TABLET | ORAL | 3 refills | Status: DC
Start: 2019-10-10 — End: 2019-10-10

## 2019-10-10 MED ORDER — POTASSIUM CHLORIDE ER 10 MEQ PO TBCR: 10.0000 meq | EXTENDED_RELEASE_TABLET | Freq: Every day | ORAL | 3 refills | Status: DC

## 2019-10-10 NOTE — Progress Notes (Signed)
Carelink Summary Report / Loop Recorder

## 2019-10-11 ENCOUNTER — Telehealth (HOSPITAL_COMMUNITY): Payer: Self-pay

## 2019-10-11 NOTE — Telephone Encounter (Signed)
Patient called stating that he is experiencing swelling and weight gain over the past 4 days. Patient has been taking all medications as prescribed and has been wearing compression hoses during the day. He states he is unable to elevate his legs during the day due to him having RLS. Please advise

## 2019-10-11 NOTE — Telephone Encounter (Signed)
He can increase torsemide to 100 mg bid x 3 days then back to 80 mg bid after that.  Get appt with NP/PA.

## 2019-10-12 NOTE — Telephone Encounter (Signed)
Patient advised and verbalized understanding. Appt scheduled

## 2019-10-15 ENCOUNTER — Telehealth: Payer: Self-pay | Admitting: Neurology

## 2019-10-15 NOTE — Telephone Encounter (Signed)
Pt called rOPINIRole (REQUIP) 2 MG tablet is not working. Pt said legs hurting so bad, was up most of the nighl Pt would like a call from the nurse.

## 2019-10-16 ENCOUNTER — Other Ambulatory Visit (HOSPITAL_COMMUNITY): Payer: Self-pay | Admitting: Cardiology

## 2019-10-17 ENCOUNTER — Telehealth (HOSPITAL_COMMUNITY): Payer: Self-pay | Admitting: Pharmacy Technician

## 2019-10-17 NOTE — Telephone Encounter (Signed)
I called the pt back. He states he has been taking the Ropinirole three times a day for "quite awhile". He states the restless leg symptoms are worse, lasts hours. The other night an episode lasted from 9 PM to 4 AM. He is uncomfortable. Used to respond to hot showers but no longer does. Pt states he is not with hospice anymore. Pt was offered an appt with the NP tomorrow. He will check with transportation and call us back asap.

## 2019-10-17 NOTE — Telephone Encounter (Signed)
Pt called back and accepted appt with [facility] NP tomorrow 9/16 @ 10:00 AM arrival 15-30 minutes early.

## 2019-10-17 NOTE — Telephone Encounter (Signed)
Was able to obtain a Patient [employer] for [facility] for the patient.  Pharmacy Billing Information  BIN: [id number]  PCN: [id number]  Group: [id number]  ID: [id number]  Called [facility] and provided the billing information. Called and updated the patient as well.  Charlann Boxer, CPhT

## 2019-10-18 ENCOUNTER — Encounter: Payer: Self-pay | Admitting: Adult Health

## 2019-10-18 ENCOUNTER — Other Ambulatory Visit: Payer: Self-pay

## 2019-10-18 ENCOUNTER — Ambulatory Visit: Payer: Medicare HMO | Admitting: Adult Health

## 2019-10-18 ENCOUNTER — Other Ambulatory Visit (HOSPITAL_COMMUNITY): Payer: Self-pay | Admitting: Cardiology

## 2019-10-18 VITALS — BP 101/59 | HR 77 | Ht 67.25 in | Wt 170.0 lb

## 2019-10-18 DIAGNOSIS — G2581 Restless legs syndrome: Secondary | ICD-10-CM

## 2019-10-18 NOTE — Progress Notes (Addendum)
PATIENT: Ryan [name] DOB: 01-01-1932  REASON FOR VISIT: follow up HISTORY FROM: patient  HISTORY OF PRESENT ILLNESS: Today 10/18/19:  Mr. Liszewski is an 86 year old male with a history of restless leg syndrome, CVA and chronic back pain.  He returns today to discuss restless legs.  He states that he is currently taking Requip 2 mg 3 times a day.  He states that he tries to take them 8 hours apart.  He states that he has been waking up during the night with symptoms.  He states that this can occur anytime between 10 and 12 and sometimes will last till 4 in the morning.  He states that a hot shower is sometimes beneficial.  Initially tramadol offered him the most benefit but it no longer works.  He states that he does not have any symptoms during the day.  He returns today for an evaluation.    REVIEW OF SYSTEMS: Out of a complete 14 system review of symptoms, the patient complains only of the following symptoms, and all other reviewed systems are negative.  See HPI  ALLERGIES: Allergies  Allergen Reactions  . Pollen Extract-Tree Extract Other (See Comments)    HEADACHES, TIRED , DRAINAGE FROM SINUSES  . Molds & Smuts Other (See Comments)    Also dust mites causes sinus infections, h/a etc.    HOME MEDICATIONS: Outpatient Medications Prior to Visit  Medication Sig Dispense Refill  . Acetaminophen (TYLENOL PO) Take by mouth. Takes with Aleve three times daily.    Marland Kitchen allopurinol (ZYLOPRIM) 100 MG tablet Take 50 mg by mouth daily.    Marland Kitchen apixaban (ELIQUIS) 2.5 MG TABS tablet Take 1 tablet (2.5 mg total) by mouth 2 (two) times daily. 60 tablet 5  . atorvastatin (LIPITOR) 40 MG tablet Take 1 tablet (40 mg total) by mouth daily at 6 PM. 30 tablet 1  . cyanocobalamin (,VITAMIN B-12,) 1000 MCG/ML injection One ml injection intramuscular once a week for 3 weeks and then monthly thereafter. 1 mL 14  . diltiazem (CARDIZEM CD) 120 MG 24 hr capsule Take 1 capsule (120 mg total) by mouth  daily. 90 capsule 1  . escitalopram (LEXAPRO) 10 MG tablet Take 10 mg by mouth daily.    . finasteride (PROSCAR) 5 MG tablet Take 5 mg by mouth daily.    Marland Kitchen FLONASE SENSIMIST 27.5 MCG/SPRAY nasal spray Place 1 spray into the nose daily as needed for rhinitis or allergies.     Marland Kitchen ipratropium (ATROVENT) 0.03 % nasal spray Place 2 sprays into both nostrils 2 (two) times daily.    Marland Kitchen latanoprost (XALATAN) 0.005 % ophthalmic solution Place 1 drop into both eyes at bedtime.     Marland Kitchen linaclotide (LINZESS) 290 MCG CAPS capsule Take 290 mcg by mouth daily before breakfast.    . methocarbamol (ROBAXIN) 500 MG tablet Take 500-1,000 mg by mouth every 8 (eight) hours as needed for muscle spasms.    . methylcellulose (CITRUCEL) oral powder Mix 2 grams in 8 ounces of water and drink twice a day. 454 g 0  . Naproxen Sodium (ALEVE PO) Take by mouth. Takes with Tylenol three times daily    . omeprazole (PRILOSEC) 20 MG capsule Take 20 mg by mouth daily.    . potassium chloride (KLOR-CON) 10 MEQ tablet Take 1 tablet (10 mEq total) by mouth daily. 90 tablet 3  . Riociguat (ADEMPAS) 2.5 MG TABS Take 2.5 mg by mouth 3 (three) times daily.    Marland Kitchen rOPINIRole (REQUIP)  2 MG tablet Take 1 tablet (2 mg total) by mouth in the morning, at noon, and at bedtime. 90 tablet 3  . Selexipag 1600 MCG TABS Take 1.6 mcg by mouth 2 (two) times daily. UPTRAVI    . spironolactone (ALDACTONE) 25 MG tablet Take 0.5 tablets (12.5 mg total) by mouth daily. 15 tablet 3  . tiZANidine (ZANAFLEX) 2 MG tablet Take 2 mg by mouth 2 (two) times daily as needed for muscle spasms.    Marland Kitchen torsemide (DEMADEX) 20 MG tablet Take 4 tablets (80 mg total) by mouth 2 (two) times daily. 240 tablet 11  . traMADol (ULTRAM) 50 MG tablet Take 50 mg by mouth every 6 (six) hours as needed for moderate pain.     . traZODone (DESYREL) 100 MG tablet Take 100 mg by mouth at bedtime.     No facility-administered medications prior to visit.    PAST MEDICAL HISTORY: Past  Medical History:  Diagnosis Date  . Asthma   . Barrett's esophagus   . Benign prostatic hypertrophy   . Bilateral lower extremity edema   . CHF (congestive heart failure) ([place])   . Chronic fatigue   . COPD (chronic obstructive pulmonary disease) ([place])   . Diabetes mellitus type 2 in nonobese ([place]) 04/24/2019  . Diverticulosis of colon (without mention of hemorrhage)   . Esophageal reflux   . Gastritis   . Gluten intolerance   . Hiatal hernia 02/10/11   Noted on CT Scan - Moderate Hiatal Hernia  . Hyperlipemia   . Hypertension   . 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 Melanoma ([place])    lymphoma  . Microcytic anemia 04/20/2011   . 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 Osteoarthritis    hands/feet,knees, NECK, BACK  . Periaortic lymphadenopathy 02/16/2011  . Personal history of colonic polyps 2004   hyperplastic Dr. Collene Mares  . Pulmonary hyperinflation   . Renal cyst 02/10/11    Noted on CT Scan - Bilateral Renal Cysts  . S/P radiation therapy 03/15/11 - 04/09/11   Abdominal/ Pelvic Tumor, 3600 cGy/20 Fractions    PAST SURGICAL HISTORY: Past Surgical History:  Procedure Laterality Date  . ARTHROSCOPIC REPAIR ACL     right  . BONE MARROW ASPIRATION  02/25/11   Bone Marrow, Aspirate, Clot, and Bilateral Bx, Right PIC  . CATARACT EXTRACTION, BILATERAL    . EYE SURGERY    . HERNIA REPAIR     LEFT INGUINAL   . INSERTION OF MESH N/A 08/23/2012   Procedure: INSERTION OF MESH;  Surgeon: Madilyn Hook, DO;  Location: [facility];  Service: General;  Laterality: N/A;  . LOOP RECORDER INSERTION N/A 04/26/2019   Procedure: LOOP RECORDER INSERTION;  Surgeon: Evans Lance, MD;  Location: [facility];  Service: Cardiovascular;  Laterality: N/A;  . LUMBAR LAMINECTOMY/DECOMPRESSION MICRODISCECTOMY Right 12/31/2013   Procedure: RIGHT L4-5 L5-S1 LAMINECTOMY;  Surgeon: Kristeen Miss, MD;   Location: [place];  Service: Neurosurgery;  Laterality: Right;  RIGHT L4-5 L5-S1 LAMINECTOMY  . RIGHT HEART CATH N/A 07/25/2018   Procedure: RIGHT HEART CATH;  Surgeon: Larey Dresser, MD;  Location: [facility];  Service: Cardiovascular;  Laterality: N/A;  . ROTATOR CUFF REPAIR     right  . TONSILLECTOMY    . TONSILLECTOMY    . VENTRAL HERNIA REPAIR N/A 08/23/2012   Procedure: HERNIA REPAIR  VENTRAL ADULT;  Surgeon: Madilyn Hook, DO;  Location: [facility];  Service: General;  Laterality: N/A;    FAMILY HISTORY: Family History  Problem Relation Age of Onset  . Heart disease Father        MI 90  . Urticaria Father   . Prostate cancer Brother   . Prostate cancer Paternal Uncle   . Prostate cancer Paternal Uncle   . Prostate cancer Paternal Uncle   . Hyperlipidemia Other   . Stroke Other   . Hypertension Other   . ADD / ADHD Other   . Colon cancer Neg Hx   . Allergic rhinitis Neg Hx   . Asthma Neg Hx   . Eczema Neg Hx     SOCIAL HISTORY: Social History   Socioeconomic History  . Marital status: Married    Spouse name: Not on file  . Number of children: 2  . Years of education: Not on file  . Highest education level: Not on file  Occupational History  . 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 Smoking status: Former Smoker    Packs/day: 1.00    Years: 35.00    Pack years: 35.00    Types: Pipe, Cigarettes    Quit date: 02/23/1992    Years since quitting: 27.6  . Smokeless tobacco: Never Used  . Tobacco comment: quit 20 years ago  Vaping Use  . Vaping Use: Never used  Substance and Sexual Activity  . Alcohol use: Not Currently    Comment: 2 drinks daily scotch  AND WINE WITH SUPPER  . Drug use: No  . Sexual activity: Not on file  Other Topics Concern  . Not on file  Social History Narrative   Married - second marriage   He has two children   Retired Horticulturist, commercial Professor   Currently teaches pottery making, has  a studio in [facility]   Former Smoker quit 12 years ago- smoked for 35 years   Alcohol use- not currently   Right handed   Social Determinants of Radio broadcast assistant Strain:   . Difficulty of Paying Living Expenses: Not on file  Food Insecurity:   . Worried About Charity fundraiser in the Last Year: Not on file  . Ran Out of Food in the Last Year: Not on file  Transportation Needs:   . Lack of Transportation (Medical): Not on file  . Lack of Transportation (Non-Medical): Not on file  Physical Activity:   . Days of Exercise per Week: Not on file  . Minutes of Exercise per Session: Not on file  Stress:   . Feeling of Stress : Not on file  Social Connections:   . Frequency of Communication with Friends and Family: Not on file  . Frequency of Social Gatherings with Friends and Family: Not on file  . Attends Religious Services: Not on file  . Active Member of Clubs or Organizations: Not on file  . Attends Archivist Meetings: Not on file  . Marital Status: Not on file  Intimate Partner Violence:   . Fear of Current or Ex-Partner: Not on file  . Emotionally Abused: Not on file  . Physically Abused: Not on file  . Sexually Abused: Not on file      PHYSICAL EXAM  Vitals:   10/18/19 0942  BP: (!) 101/59  Pulse: 77  Weight: 170 lb (77.1 kg)  Height: 5' 7.25" (1.708 m)  Body mass index is 26.43 kg/m.  Generalized: Well developed, in no acute distress   Neurological examination  Mentation: Alert oriented to time, place, history taking. Follows all commands speech and language fluent Cranial nerve II-XII: Pupils were equal round reactive to light. Extraocular movements were full, visual field were full on confrontational test.  Head turning and shoulder shrug  were normal and symmetric. Motor: The motor testing reveals 5 over 5 strength of all 4 extremities. Good symmetric motor tone is noted throughout.  Sensory: Sensory testing is intact to soft  touch on all 4 extremities. No evidence of extinction is noted.  Coordination: Cerebellar testing reveals good finger-nose-finger and heel-to-shin bilaterally.  Gait and station: Uses a Rollator when ambulating .   DIAGNOSTIC DATA (LABS, IMAGING, TESTING) - I reviewed patient records, labs, notes, testing and imaging myself where available.  Lab Results  Component Value Date   WBC 7.2 07/13/2019   HGB 10.4 (L) 07/13/2019   HCT 35.2 (L) 07/13/2019   MCV 97.5 07/13/2019   PLT 291 07/13/2019      Component Value Date/Time   NA 138 10/02/2019 1115   NA 144 03/16/2017 1137   NA 143 06/26/2015 0916   K 4.8 10/02/2019 1115   K 3.8 06/26/2015 0916   CL 101 10/02/2019 1115   CL 105 04/07/2012 0925   CO2 25 10/02/2019 1115   CO2 32 (H) 06/26/2015 0916   GLUCOSE 86 10/02/2019 1115   GLUCOSE 102 06/26/2015 0916   GLUCOSE 86 04/07/2012 0925   BUN 70 (H) 10/02/2019 1115   BUN 19 03/16/2017 1137   BUN 14.0 06/26/2015 0916   CREATININE 2.01 (H) 10/02/2019 1115   CREATININE 1.0 06/26/2015 0916   CALCIUM 8.2 (L) 10/02/2019 1115   CALCIUM 8.9 06/26/2015 0916   PROT 6.8 07/13/2019 1047   PROT 6.2 (L) 06/26/2015 0916   ALBUMIN 3.9 07/13/2019 1047   ALBUMIN 3.5 06/26/2015 0916   AST 11 (L) 07/13/2019 1047   AST 17 06/26/2015 0916   ALT 6 07/13/2019 1047   ALT 15 06/26/2015 0916   ALKPHOS 70 07/13/2019 1047   ALKPHOS 87 06/26/2015 0916   BILITOT 0.8 07/13/2019 1047   BILITOT 0.50 06/26/2015 0916   GFRNONAA 29 (L) 10/02/2019 1115   GFRAA 34 (L) 10/02/2019 1115   Lab Results  Component Value Date   CHOL 159 09/24/2019   HDL 45 09/24/2019   LDLCALC 82 09/24/2019   LDLDIRECT 165.7 12/23/2008   TRIG 159 (H) 09/24/2019   CHOLHDL 3.5 09/24/2019   Lab Results  Component Value Date   HGBA1C 5.9 (H) 04/25/2019   Lab Results  Component Value Date   VITAMINB12 231 (L) 05/28/2019   Lab Results  Component Value Date   TSH 1.444 06/11/2011      ASSESSMENT AND PLAN 86 y.o. 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 here with:  Restless leg syndrome   Continue Requip 2 mg 3 times a day  Advised that he should delay the noon dose and take around dinnertime and the last dose should be taken approximately 30 minutes before bedtime.  Advised if this is not beneficial he should let us know.  We may have to try extended release formulation in the future.  Follow-up in 6 months or sooner if needed   I spent 20 minutes of face-to-face and non-face-to-face time with patient.  This included previsit chart review, lab review, study review, order entry, electronic health record documentation, patient education.  Ward Givens, MSN, NP-C 10/18/2019, 10:23 AM [facility] [address], [place], [place] [address] [phone]  Made any corrections needed, and agree with history, physical, neuro exam,assessment and plan as stated.     Sarina Ill, MD [facility]

## 2019-10-18 NOTE — Patient Instructions (Signed)
Your Plan:  Continue requip.  Try taking noon dose later in the day If your symptoms worsen or you develop new symptoms please let us know.       Thank you for coming to see [place] at [facility]. I hope we have been able to provide you high quality care today.  You may receive a patient satisfaction survey over the next few weeks. We would appreciate your feedback and comments so that we may continue to improve ourselves and the health of our patients.

## 2019-10-22 ENCOUNTER — Ambulatory Visit (HOSPITAL_COMMUNITY)
Admission: RE | Admit: 2019-10-22 | Discharge: 2019-10-22 | Disposition: A | Discharge status: Home / Self Care | Payer: Medicare HMO | Source: Ambulatory Visit | Attending: Adult Health | Admitting: Adult Health

## 2019-10-22 ENCOUNTER — Other Ambulatory Visit: Payer: Self-pay

## 2019-10-22 ENCOUNTER — Encounter (HOSPITAL_COMMUNITY): Payer: Self-pay

## 2019-10-22 VITALS — BP 110/58 | HR 73 | Ht 67.25 in | Wt 175.4 lb

## 2019-10-22 DIAGNOSIS — K219 Gastro-esophageal reflux disease without esophagitis: Secondary | ICD-10-CM | POA: Diagnosis not present

## 2019-10-22 DIAGNOSIS — N183 Chronic kidney disease, stage 3 unspecified: Secondary | ICD-10-CM | POA: Insufficient documentation

## 2019-10-22 DIAGNOSIS — Z950 Presence of cardiac pacemaker: Secondary | ICD-10-CM | POA: Insufficient documentation

## 2019-10-22 DIAGNOSIS — I272 Pulmonary hypertension, unspecified: Secondary | ICD-10-CM

## 2019-10-22 DIAGNOSIS — G4733 Obstructive sleep apnea (adult) (pediatric): Secondary | ICD-10-CM | POA: Insufficient documentation

## 2019-10-22 DIAGNOSIS — Z8249 Family history of ischemic heart disease and other diseases of the circulatory system: Secondary | ICD-10-CM | POA: Insufficient documentation

## 2019-10-22 DIAGNOSIS — J8410 Pulmonary fibrosis, unspecified: Secondary | ICD-10-CM | POA: Insufficient documentation

## 2019-10-22 DIAGNOSIS — Z8673 Personal history of transient ischemic attack (TIA), and cerebral infarction without residual deficits: Secondary | ICD-10-CM | POA: Diagnosis not present

## 2019-10-22 DIAGNOSIS — Z8572 Personal history of non-Hodgkin lymphomas: Secondary | ICD-10-CM | POA: Diagnosis not present

## 2019-10-22 DIAGNOSIS — I130 Hypertensive heart and chronic kidney disease with heart failure and stage 1 through stage 4 chronic kidney disease, or unspecified chronic kidney disease: Secondary | ICD-10-CM | POA: Diagnosis not present

## 2019-10-22 DIAGNOSIS — I480 Paroxysmal atrial fibrillation: Secondary | ICD-10-CM | POA: Insufficient documentation

## 2019-10-22 DIAGNOSIS — I5032 Chronic diastolic (congestive) heart failure: Secondary | ICD-10-CM | POA: Diagnosis not present

## 2019-10-22 DIAGNOSIS — Z7901 Long term (current) use of anticoagulants: Secondary | ICD-10-CM | POA: Insufficient documentation

## 2019-10-22 DIAGNOSIS — Z87891 Personal history of nicotine dependence: Secondary | ICD-10-CM | POA: Insufficient documentation

## 2019-10-22 DIAGNOSIS — Z79899 Other long term (current) drug therapy: Secondary | ICD-10-CM | POA: Diagnosis not present

## 2019-10-22 DIAGNOSIS — I2721 Secondary pulmonary arterial hypertension: Secondary | ICD-10-CM | POA: Insufficient documentation

## 2019-10-22 LAB — BASIC METABOLIC PANEL
Anion gap: 12 (ref 5–15)
BUN: 58 mg/dL — ABNORMAL HIGH (ref 8–23)
CO2: 25 mmol/L (ref 22–32)
Calcium: 8.3 mg/dL — ABNORMAL LOW (ref 8.9–10.3)
Chloride: 99 mmol/L (ref 98–111)
Creatinine, Ser: 1.7 mg/dL — ABNORMAL HIGH (ref 0.61–1.24)
GFR calc Af Amer: 42 mL/min — ABNORMAL LOW (ref >60)
GFR calc non Af Amer: 36 mL/min — ABNORMAL LOW (ref >60)
Glucose, Bld: 109 mg/dL — ABNORMAL HIGH (ref 70–99)
Potassium: 4.5 mmol/L (ref 3.5–5.1)
Sodium: 136 mmol/L (ref 135–145)

## 2019-10-22 NOTE — Patient Instructions (Signed)
Labs today We will only contact you if something comes back abnormal or we need to make some changes. Otherwise no news is good news!  Keep follow up as scheduled with Dr Aundra Dubin in November  If you have any questions or concerns before your next appointment please send [place] a message through [place] or call our office at [phone].    TO LEAVE A MESSAGE FOR THE NURSE SELECT OPTION 2, PLEASE LEAVE A MESSAGE INCLUDING: . YOUR NAME . DATE OF BIRTH . CALL BACK NUMBER . REASON FOR CALL**this is important as we prioritize the call backs  YOU WILL RECEIVE A CALL BACK THE SAME DAY AS LONG AS YOU CALL BEFORE 4:00 PM

## 2019-10-22 NOTE — Progress Notes (Signed)
Station C Ruler 36 Vest 30%

## 2019-10-22 NOTE — Progress Notes (Signed)
PCP: Roetta Sessions, NP Cardiology: Dr. Aundra Dubin  86 y.o. with history of interstitial lung disease (?NSIP vs UIP) on home oxygen, pulmonary hypertension, diastolic CHF, and prior non-Hodgkins lymphoma (pelvic mass, s/p XRT) presents for followup of CHF and pulmonary hypertension.  Patient has history of reportedly mild ILD (NSIP vs UIP) followed by a pulmonologist at [facility] and pulmonary hypertension thought to be out of proportion lung disease followed by a physician at [facility].  Last CT chest was in 11/19, showing stable ILD (?UIP vs NSIP).  He had RHC in 2/20 with moderate PAH, PVR 5.1 WU.  He was tried on Adcirca but developed cognitive problems and worsening of peripheral edema so this was stopped.  He has been on home oxygen, uses at night and with exertion.   He was admitted in 6/20 with worsening dyspnea and peripheral edema, weight up despite compliance with torsemide 40 mg bid at home.  He has CKD stage 3 and torsemide was decreased in the spring. He was diuresed aggressively with fall in weight.  Echo showed normal LV size and systolic function, RV reportedly normal.  6/20 RHC showed normal filling pressures with moderate pulmonary hypertension, PVR 5.35 WU.   After discharge from hospital, patient started on ambrisentan.  His weight steadily began to increase and he developed worsening lower extremity edema, we stopped ambrisentan.   Patient was admitted in 8/20 with AKI after taking metolazone for several days.  He was admitted again in 8/20 with acute on chronic diastolic CHF. He was diuresed.  Wandering atrial pacemaker noted.   He was admitted in 3/21 with suspected CVA, MRI head showed punctate occipital lobe infarct, thought to be due to small vessel disease. ILR was implanted.  Echo in 3/21 showed EF 60-65%, severe LVH, normal RV.  PYP scan in 3/21 was not suggestive of TTR amyloidosis.  Subsequently, ILR has shown paroxysmal atrial fibrillation.   He was admitted later in  3/21 with AKI, creatinine up to 5.  However, he corrected rapidly to baseline with IVF and creatinine was 1.02 when discharge.   Called clinic 10/09/19 due to swelling and weight gain. Instructed to increase torsemide to 100 mg twice a day x 3 days then back to 80 mg twice a day.   Today he returns for HF follow up with his ex-wife. This summer he was able to go to the pool a few times. Overall feeling fine. Mild dyspnea with exertion. Denies PND/Orthopnea. Cant wear compression stockings due to restless leg syndrome. Appetite ok. No fever or chills. Weight at home 175-180  pounds. Taking all medications.    6 minute walk (8/21): 122 m  Labs (6/20): K 4.7, creatinine 1.46 Labs (7/20): K 3.1, creatinine 1.67 Labs (8/20): K 3.9, creatinine 1.8 Labs (9/20): K 4.9, creatinine 1.76, BNP 78 Labs (10/20): K 4.2, creatininine 1.5 Labs (11/20): BNP 36, K 4.3, creatinine 1.4 Labs (3/21): myeloma panel negative, urine immunofixation negative.  Labs (4/21): K 4.5, creatinine 1.02, hgb 11.4 Labs (6/21): K 5.1, creatinine 2.2, hgb 10.4 Labs (10/02/19): K 4.8 Creatinine 2  PMH: 1. Non-Hodgkin's lymphoma: Pelvic involvement.  S/p radiation.  Thought to be in remission (Dr. Benay Spice).  2. HTN 3. PACs/PVCs 4. GERD: Barrett's esophagus.  5. 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  - High resolution CT chest 11/19: ILD, ?UIP pattern.  6. Pulmonary hypertension: Thought to be out of proportion to underlying lung disease (ILD, ?NSIP vs UIP).   - RHC (2/20): mean RA 10, PASP 60  with mean 42, mean PCWP 11, CI 3, PVR 5.1 WU.  - Trial of Adcirca => failed due to ?memory worsening/cognitive affects and peripheral edema.  - Echo (6/20): EF 60-65%, mild LVH, RV reportedly normal, PASP 59 mmHg.  - RHC (6/20): mean RA 7, PA 50/24 mean 34, mean PCWP 12, CI 2.01, PAPI 3.7, PVR 5.35 WU - RF negative, ANA negative, anti-SCL70 negative, anti-centromere negative.  - V/Q scan (8/20): No evidence for chronic PE.  - Echo (3/21): EF  60-65%, severe LVH, normal RV.  7. CKD: Stage 3.   8. Wandering atrial pacemaker 9. OSA 10. PYP scan (3/21): H/CL ratio 1.0, grade 0.  11. CVA: MRI 3/21 with punctate occipital lobe infarct thought to be due to small vessel disease. - Carotid dopplers (3/21): 40-59% RICA stenosis.  12. Atrial fibrillation: Paroxysmal, noted on ILR.   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[employer], [employer]  Tobacco Use   Smoking status: Former Smoker    Packs/day: 1.00    Years: 35.00    Pack years: 35.00    Types: Pipe, Cigarettes    Quit date: 02/23/1992    Years since quitting: 27.6   Smokeless tobacco: Never Used   Tobacco comment: quit 20 years ago  Vaping Use   Vaping Use: Never used  Substance and Sexual Activity   Alcohol use: Not Currently    Comment: 2 drinks daily scotch  AND WINE WITH SUPPER   Drug use: No   Sexual activity: Not on file  Other Topics Concern   Not on file  Social History Narrative   Married - second marriage   He has two children   Retired Horticulturist, commercial Professor   Currently teaches pottery making, 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[facility]   Former Smoker quit 12 years ago- smoked for 35 years   Alcohol use- not currently   Right handed   Social Determinants of Radio broadcast assistant Strain:    Difficulty of Paying Living Expenses: Not on file  Food Insecurity:    Worried About Charity fundraiser in the Last Year: Not on file   Adrian in the Last Year: Not on file  Transportation Needs:    Film/video editor (Medical): Not on file   Lack of Transportation (Non-Medical): Not on file  Physical Activity:    Days of Exercise per Week: Not on file   Minutes of Exercise per Session: Not on file  Stress:     Feeling of Stress : Not on file  Social Connections:    Frequency of Communication with Friends and Family: Not on file   Frequency of Social Gatherings with Friends and Family: Not on file   Attends Religious Services: Not on file   Active Member of Clubs or Organizations: Not on file   Attends Archivist Meetings: Not on file   Marital Status: Not on file  Intimate Partner Violence:    Fear of Current or Ex-Partner: Not on file   Emotionally Abused: Not on file   Physically Abused: Not on file   Sexually Abused: Not on file   Family History  Problem Relation Age of Onset   Heart disease  Father        MI 12   Urticaria Father    Prostate cancer Brother    Prostate cancer Paternal Uncle    Prostate cancer Paternal Uncle    Prostate cancer Paternal Uncle    Hyperlipidemia Other    Stroke Other    Hypertension Other    ADD / ADHD Other    Colon cancer Neg Hx    Allergic rhinitis Neg Hx    Asthma Neg Hx    Eczema Neg Hx    ROS: All systems reviewed and negative except as per HPI.   Current Outpatient Medications  Medication Sig Dispense Refill   Acetaminophen (TYLENOL PO) Take by mouth. Takes with Aleve three times daily.     allopurinol (ZYLOPRIM) 100 MG tablet Take 50 mg by mouth daily.     apixaban (ELIQUIS) 2.5 MG TABS tablet Take 1 tablet (2.5 mg total) by mouth 2 (two) times daily. 60 tablet 5   atorvastatin (LIPITOR) 40 MG tablet Take 1 tablet (40 mg total) by mouth daily at 6 PM. 30 tablet 1   cyanocobalamin (,VITAMIN B-12,) 1000 MCG/ML injection One ml injection intramuscular once a week for 3 weeks and then monthly thereafter. 1 mL 14   diltiazem (CARDIZEM CD) 120 MG 24 hr capsule Take 1 capsule (120 mg total) by mouth daily. 90 capsule 1   escitalopram (LEXAPRO) 10 MG tablet Take 10 mg by mouth daily.     finasteride (PROSCAR) 5 MG tablet Take 5 mg by mouth daily.     FLONASE SENSIMIST 27.5 MCG/SPRAY nasal spray Place  1 spray into the nose daily as needed for rhinitis or allergies.      ipratropium (ATROVENT) 0.03 % nasal spray Place 2 sprays into both nostrils 2 (two) times daily.     latanoprost (XALATAN) 0.005 % ophthalmic solution Place 1 drop into both eyes at bedtime.      linaclotide (LINZESS) 290 MCG CAPS capsule Take 290 mcg by mouth daily before breakfast.     methocarbamol (ROBAXIN) 500 MG tablet Take 500-1,000 mg by mouth every 8 (eight) hours as needed for muscle spasms.     methylcellulose (CITRUCEL) oral powder Mix 2 grams in 8 ounces of water and drink twice a day. 454 g 0   Naproxen Sodium (ALEVE PO) Take by mouth. Takes with Tylenol three times daily     omeprazole (PRILOSEC) 20 MG capsule Take 20 mg by mouth daily.     potassium chloride (KLOR-CON) 10 MEQ tablet Take 1 tablet (10 mEq total) by mouth daily. 90 tablet 3   Riociguat (ADEMPAS) 2.5 MG TABS Take 2.5 mg by mouth 3 (three) times daily.     rOPINIRole (REQUIP) 2 MG tablet Take 1 tablet (2 mg total) by mouth in the morning, at noon, and at bedtime. 90 tablet 3   Selexipag 1600 MCG TABS Take 1.6 mcg by mouth 2 (two) times daily. UPTRAVI     spironolactone (ALDACTONE) 25 MG tablet Take 0.5 tablets (12.5 mg total) by mouth daily. 15 tablet 3   tiZANidine (ZANAFLEX) 2 MG tablet Take 2 mg by mouth 2 (two) times daily as needed for muscle spasms.     torsemide (DEMADEX) 20 MG tablet TAKE 5 TABLETS EVERY MORNING AND 4 TABLETS EVERY EVENING 270 tablet 3   traMADol (ULTRAM) 50 MG tablet Take 50 mg by mouth every 6 (six) hours as needed for moderate pain.      traZODone (DESYREL) 100 MG tablet Take  100 mg by mouth at bedtime.     No current facility-administered medications for this encounter.   BP (!) 110/58 (BP Location: Left Arm, Patient Position: Sitting, Cuff Size: Normal)    Pulse 73    Ht 5' 7.25" (1.708 m)    Wt 79.6 kg (175 lb 6.4 oz)    SpO2 93%    BMI 27.27 kg/m   Wt Readings from Last 3 Encounters:  10/22/19 79.6  kg (175 lb 6.4 oz)  10/18/19 77.1 kg (170 lb)  09/24/19 78.5 kg (173 lb)  Reds Clip 30%   General:  No resp difficulty HEENT: normal Neck: supple. no JVD. Carotids 2+ bilat; no bruits. No lymphadenopathy or thryomegaly appreciated. Cor: PMI nondisplaced. Regular rate & rhythm. No rubs, gallops or murmurs. Lungs: clear Abdomen: soft, nontender, nondistended. No hepatosplenomegaly. No bruits or masses. Good bowel sounds. Extremities: no cyanosis, clubbing, rash, R and LLE ok but does have 1+ edema in his feet. edema Neuro: alert & orientedx3, cranial nerves grossly intact. moves all 4 extremities w/o difficulty. Affect pleasant  Assessment/Plan: 1. Chronic diastolic CHF: Suspect with significant component of RV failure from pulmonary hypertension. Echo in 6/20 with EF 60-65%, RV not well-visualized but PASP 59 mmHg.  Echo (3/21) with EF 60-65%, severe LVH, normal RV, PASP 28 mmHg.   NYHA class III symptoms able to walk more. Reds Clip 30%.  - Volume status stable.  Continue torsemide 80 mg bid. Unable to wear compression stockings.  - Continue spironolactone 25 mg daily pending  - Check BMET today.   2. CKD Stage 3:  Creatinine baseline ~2.  Check BMET  3. Pulmonary hypertension: Patient thought to have [place] out of proportion to his lung disease (ILD, NSIP versus UIP). RHC in 2/20 showed mean PA pressure 41, PVR 5.1 WU, CI 3. Echo in 6/20 with RV reportedly ok =>I reviewed and do not think that RV was well-visualized. Patient did not tolerate Adcirca due to edema/?cognitive effects.  RHC was done again in 6/20, showed moderate pulmonary arterial hypertension with PVR 5.35 (comparable to prior).  Serological workup for PH was negative. Patient started ambrisentan 5 mg and developed significant edema/volume overload despite a high dose of torsemide. He is now on Uptravi + riociguat.  Most recent echo in 3/21 showed normal RV size/systolic function and PA systolic pressure estimate was normal.   Overall, symptoms are improved.  - he will stay off ambrisentan (?trigger for volume overload).  - As above, he did not tolerate tadalafil.  - Continue Uptravi, now up to 1600 mcg bid.  - Continue riociguat at goal dose 2.5 mg tid.  4. Atrial fibrillation: Paroxysmal, noted on ILR. Regular on exam.  - Continue apixaban 2.5 mg bid (dosed for age, renal dysfunction).  5. OSA: Continue CPAP.  6. CVA: In 3/21, may have been atrial fibrillation-related.  On apixaban now.  7. Carotid stenosis: 40-59% RICA stenosis on 3/21 dopplers.  - Repeat carotid dopplers in 3/22.  8. Interstitial lung disease: Needs pulmonary followup, will refer.    Follow up with Dr Aundra Dubin in November. Will need 6MW at that time.   Kajal Scalici NP-C  10/22/2019

## 2019-10-23 ENCOUNTER — Other Ambulatory Visit (HOSPITAL_COMMUNITY): Payer: Self-pay | Admitting: Cardiology

## 2019-10-23 ENCOUNTER — Telehealth: Payer: Self-pay | Admitting: Adult Health

## 2019-10-23 MED ORDER — TORSEMIDE 20 MG PO TABS
80.0000 mg | ORAL_TABLET | Freq: Two times a day (BID) | ORAL | 3 refills | Status: DC
Start: 2019-10-23 — End: 2019-12-25

## 2019-10-23 NOTE — Addendum Note (Signed)
Addended by: Shaton Lore, Sharlot Gowda on: 10/23/2019 03:00 PM   Modules accepted: Orders

## 2019-10-23 NOTE — Telephone Encounter (Signed)
·   Called pt he is taking Requip 2 mg 3 times a day as Advised.  He should delay the noon dose and take around dinnertime and the last dose should be taken approximately 30 minutes before bedtime. States it is not working.  Please advise.

## 2019-10-23 NOTE — Telephone Encounter (Signed)
LMVM for pt tat returned call.

## 2019-10-23 NOTE — Telephone Encounter (Signed)
Please clarify with the patient:   Any breakthrough symptoms during the day? Does he have trouble falling asleep d/t RLS or does he wake up with symptoms  Could switch to XL preparation at bedtime

## 2019-10-23 NOTE — Telephone Encounter (Signed)
Pt called, rOPINIRole (REQUIP) 2 MG tablet on new schedule is not working. Having  restless leg more frequently. Would like a call from the nurse.

## 2019-10-24 ENCOUNTER — Encounter: Payer: Self-pay | Admitting: *Deleted

## 2019-10-24 MED ORDER — GABAPENTIN 100 MG PO CAPS: 100.0000 mg | ORAL_CAPSULE | Freq: Every day | ORAL | 1 refills | Status: DC

## 2019-10-24 NOTE — Telephone Encounter (Signed)
I called pt and relayed the plan.  He was ok to do this.  Take the requip as ordered and use the gabapentin 134m po qhs.  I placed order just needs signing if ok.

## 2019-10-24 NOTE — Telephone Encounter (Signed)
If the patient has never tried gabapentin we can consider adding gabapentin 100 mg at bedtime.  This is low-dose but if he tolerates it well we can increase it.  In the future we may build to reduce his dose of Requip.  Please advise patient that I considered adding on Requip extended release but I am not sure that it would offer him any additional benefit.

## 2019-10-24 NOTE — Telephone Encounter (Signed)
I called pt as he did not pick up [employer].  He states that since he was in things have changed. Worse.  For the last 3-4 days he has had less then % hours sleep due to RLS.  He states the RL hits 0500-0600 for about 2 hours, then 1100-1200 for about 1.5 hours, 1500-1600.  He takes the reuip 2 mg around 0500 then mid afternoon, then at around bedtime.  He states the medication does not really help him at all now.

## 2019-10-24 NOTE — Telephone Encounter (Signed)
Opened in error.

## 2019-10-25 ENCOUNTER — Telehealth: Payer: Self-pay | Admitting: *Deleted

## 2019-10-25 ENCOUNTER — Other Ambulatory Visit: Payer: Self-pay | Admitting: *Deleted

## 2019-10-25 MED ORDER — GABAPENTIN 100 MG PO CAPS: 100.0000 mg | ORAL_CAPSULE | Freq: Every day | ORAL | 1 refills | Status: DC

## 2019-10-25 NOTE — Telephone Encounter (Signed)
Due to phone/fax issues unable to refill gabapentin per faxed Rx or e scribe. [facility], spoke with pharmacist and gave verbal Rx per NP. He  verbalized understanding, appreciation.

## 2019-10-26 NOTE — Addendum Note (Signed)
Encounter addended by: Harvie Junior, CMA on: 10/26/2019 10:45 AM  Actions taken: Charge Capture section accepted

## 2019-11-05 ENCOUNTER — Telehealth: Payer: Self-pay | Admitting: Adult Health

## 2019-11-05 NOTE — Telephone Encounter (Signed)
I called the patient left a voicemail advising that I would try to call back later this evening or first thing in the morning

## 2019-11-05 NOTE — Telephone Encounter (Signed)
Pt called, no sleep,having restless leg. Need another medication, gabapentin (NEURONTIN) 100 MG capsule and rOPINIRole (REQUIP) 2 MG tablet not working. Would like a call from the physician.

## 2019-11-06 MED ORDER — GABAPENTIN 100 MG PO CAPS
300.0000 mg | ORAL_CAPSULE | Freq: Every day | ORAL | 1 refills | Status: DC
Start: 2019-11-06 — End: 2020-04-14

## 2019-11-06 NOTE — Telephone Encounter (Signed)
I called pt and LMVM for him that MM/NP out and was not sure when she would call him back.  She recommended trying increase of gabapentin 327m po qhs (3 of 1036mcaps po qhs) will change at pharmacy for him.  If this does not owrk may have to change to differenct medication altogether.  He is to call back if questions.

## 2019-11-06 NOTE — Addendum Note (Signed)
Addended by: Brandon Melnick on: 11/06/2019 09:52 AM   Modules accepted: Orders

## 2019-11-06 NOTE — Telephone Encounter (Signed)
Can you call Ryan [name]. I tried calling him last night and let him know that he can try increasing gabapentin to 300 mg if this doesnt help then we will need to try a completely different medication.

## 2019-11-08 LAB — CUP PACEART REMOTE DEVICE CHECK
Date Time Interrogation Session: 20211006230411
Implantable Pulse Generator Implant Date: 20210325

## 2019-11-12 ENCOUNTER — Ambulatory Visit (INDEPENDENT_AMBULATORY_CARE_PROVIDER_SITE_OTHER): Payer: Medicare HMO

## 2019-11-12 DIAGNOSIS — I5032 Chronic diastolic (congestive) heart failure: Secondary | ICD-10-CM

## 2019-11-12 DIAGNOSIS — I639 Cerebral infarction, unspecified: Secondary | ICD-10-CM

## 2019-11-14 NOTE — Progress Notes (Signed)
Carelink Summary Report / Loop Recorder

## 2019-11-19 ENCOUNTER — Telehealth (HOSPITAL_COMMUNITY): Payer: Self-pay | Admitting: *Deleted

## 2019-11-19 NOTE — Telephone Encounter (Signed)
Received VM that pt had swelling in his ankles and had been experiencing some low bp readings but his bp was ok today. I called back to get more information. No answer/left vm requesting return call.

## 2019-11-22 ENCOUNTER — Other Ambulatory Visit (HOSPITAL_COMMUNITY): Payer: Self-pay | Admitting: *Deleted

## 2019-11-22 MED ORDER — SELEXIPAG 1600 MCG PO TABS: 1.6000 ug | ORAL_TABLET | Freq: Two times a day (BID) | ORAL | 3 refills | Status: DC

## 2019-11-22 MED ORDER — SELEXIPAG 1600 MCG PO TABS: 1600.0000 ug | ORAL_TABLET | Freq: Two times a day (BID) | ORAL | 6 refills | Status: DC

## 2019-11-26 ENCOUNTER — Telehealth: Payer: Self-pay | Admitting: Adult Health

## 2019-11-26 NOTE — Telephone Encounter (Signed)
Pt called stating that the in (NEURONTIN) 100 MG capsule is not working for his RLS. Pt was up all night due to his RLS and is wanting to discuss with the provider to see if there is something else that can be done. Please advise.

## 2019-11-27 NOTE — Telephone Encounter (Signed)
LMVM for pt to return call.  Gabapentin 344m po qhs ,requip 230mpo tid.

## 2019-11-28 NOTE — Telephone Encounter (Signed)
I called pt and he states that the 312m gabapentin that he takes on nightly basis causes him to be dopey/ hard to wake in am.  He would prefer to try something else altogether.  Will take continue with gabapentin until hears back, which I relayed cold be tomorrow,  He verbalized understanding.

## 2019-11-28 NOTE — Telephone Encounter (Signed)
I called pt and he is taking the gabapentin 333m po qhs and the requip 219mpo tid.  He cant really tell if the gabapentin is working or not.  He has some nighst that are so bad that he cannot sleep at all. Hard for him to say if gabapentin is helping or not.  Please advise.

## 2019-11-28 NOTE — Telephone Encounter (Signed)
We could increase gabapentin to 400 mg or change to another medication

## 2019-12-03 NOTE — Telephone Encounter (Signed)
I called the patient and LVM for him to call office

## 2019-12-03 NOTE — Telephone Encounter (Signed)
Pt has returned the call to [place], NP.  He is asking for a returned call.

## 2019-12-03 NOTE — Telephone Encounter (Signed)
I called the patient LVM x2 for him to call the office.

## 2019-12-04 NOTE — Telephone Encounter (Signed)
FYI, wife(Alcon,Jane on DPR) has called in response to message from [facility].  Wife was advised she would have to speak with billing dept. Before being able to schedule an appointment.

## 2019-12-04 NOTE — Telephone Encounter (Signed)
Ryan [name] I tired  to reach the patient several times yesterday.  It may be best to schedule a virtual visit video or telephone to discuss with him different medication options.

## 2019-12-04 NOTE — Telephone Encounter (Signed)
LMVM for pt on home, mobile # listed to call back about appt.  (MM/NP had LM for him re: med changes).

## 2019-12-05 NOTE — Telephone Encounter (Signed)
I see appt made 12-10-19 for VO with MM/NP.

## 2019-12-06 ENCOUNTER — Other Ambulatory Visit (HOSPITAL_COMMUNITY): Payer: Self-pay | Admitting: Cardiology

## 2019-12-10 ENCOUNTER — Encounter: Payer: Self-pay | Admitting: Adult Health

## 2019-12-10 ENCOUNTER — Ambulatory Visit: Payer: Medicare HMO | Admitting: Adult Health

## 2019-12-10 VITALS — BP 116/72 | Ht 67.25 in | Wt 164.0 lb

## 2019-12-10 DIAGNOSIS — E538 Deficiency of other specified B group vitamins: Secondary | ICD-10-CM

## 2019-12-10 DIAGNOSIS — G2581 Restless legs syndrome: Secondary | ICD-10-CM | POA: Diagnosis not present

## 2019-12-10 MED ORDER — PRAMIPEXOLE DIHYDROCHLORIDE 0.125 MG PO TABS: 0.1250 mg | ORAL_TABLET | Freq: Three times a day (TID) | ORAL | 5 refills | Status: DC

## 2019-12-10 NOTE — Patient Instructions (Signed)
Your Plan:  Stop requip Start mirapex 0.125 mg three times a day If your symptoms worsen or you develop new symptoms please let Ryan [name] know.    Thank you for coming to see Ryan [name] at [facility]. I hope we have been able to provide you high quality care today.  You may receive a patient satisfaction survey over the next few weeks. We would appreciate your feedback and comments so that we may continue to improve ourselves and the health of our patients.  Pramipexole tablets What is this medicine? PRAMIPEXOLE (pra mi PEX ole) is used to treat symptoms of Parkinson's disease. It is also used to treat Restless Legs Syndrome. This medicine may be used for other purposes; ask your health care provider or pharmacist if you have questions. COMMON BRAND NAME(S): Mirapex What should I tell my health care provider before I take this medicine? They need to know if you have any of these conditions:  heart disease  if you often drink alcohol  kidney disease  low blood pressure  mental illness  narcolepsy  on hemodialysis  sleep apnea  an unusual or allergic reaction to pramipexole, other medicines, foods, dyes, or preservatives  pregnant or trying to get pregnant  breast-feeding How should I use this medicine? Take this medicine by mouth with a glass of water. Follow the directions on the prescription label. Take with food. Take your doses at regular intervals. Do not stop taking this medicine suddenly except upon the advice of your doctor. Stopping this medicine too quickly may cause side effects or your condition may worsen. Talk to your pediatrician regarding the use of this medicine in children. Special care may be needed. Overdosage: If you think you have taken too much of this medicine contact a poison control center or emergency room at once. NOTE: This medicine is only for you. Do not share this medicine with others. What if I miss a dose? If you miss a dose, take it  as soon as you can. If it is almost time for your next dose, take only that dose. Do not take double or extra doses. What may interact with this medicine?  alcohol  antihistamines for allergy, cough and cold  certain medicines for depression, anxiety, or psychotic disturbances  certain medicines for seizures like phenobarbital, primidone  certain medicines for sleep  general anesthetics like halothane, isoflurane, methoxyflurane, propofol  medicines for blood pressure  medicines that relax muscles for surgery  metoclopramide  narcotic medicines for pain This list may not describe all possible interactions. Give your health care provider a list of all the medicines, herbs, non-prescription drugs, or dietary supplements you use. Also tell them if you smoke, drink alcohol, or use illegal drugs. Some items may interact with your medicine. What should I watch for while using this medicine? Visit your health care professional for regular checks on your progress. Tell your health care professional if your symptoms do not start to get better or if they get worse. Do not stop taking except on your health care professional's advice. You may develop a severe reaction. Your health care professional will tell you how much medicine to take. You may get drowsy or dizzy. Do not drive, use machinery, or do anything that needs mental alertness until you know how this drug affects you. Do not stand or sit up quickly, especially if you are an older patient. This reduces the risk of dizzy or fainting spells. Alcohol may interfere with the effect of this  medicine. Avoid alcoholic drinks. Your mouth may get dry. Chewing sugarless gum or sucking hard candy and drinking plenty of water may help. Contact your health care professional if the problem does not go away or is severe. When taking this medicine, you may fall asleep without notice. You may be doing activities like driving a car, talking, or eating. You may  not feel drowsy before it happens. Contact your health care provider right away if this happens to you. There have been reports of increased sexual urges or other strong urges such as gambling while taking this medicine. If you experience any of these while taking this medicine, you should report this to your health care provider as soon as possible. Talk with your doctor if you have posture changes you cannot control. These may include your neck bending forward, your spine bending forward at the waist, or tilting sideways when you sit, stand, or walk. What side effects may I notice from receiving this medicine? Side effects that you should report to your doctor or health care professional as soon as possible:  allergic reactions like skin rash, itching or hives, swelling of the face, lips, or tongue  changes in emotions or moods  changes in vision  confusion  falling asleep during normal activities like driving  hallucinations  involuntary muscle contractions; difficulty swallowing; difficulty walking  new or increased gambling urges, sexual urges, uncontrolled spending, binge or compulsive eating, or other urges  new or worsening curve in the spine  signs and symptoms of low blood pressure like dizziness; feeling faint or lightheaded; falls; unusually weak or tired  uncontrollable movements of the arms, face, head, mouth, neck, or upper body Side effects that usually do not require medical attention (report to your doctor or health care professional if they continue or are bothersome):  constipation  drowsiness  dry mouth  nausea  trouble sleeping This list may not describe all possible side effects. Call your doctor for medical advice about side effects. You may report side effects to FDA at 1-800-FDA-1088. Where should I keep my medicine? Keep out of the reach of children. Store at room temperature between 15 and 30 degrees C (59 and 86 degrees F). Protect from light.  Throw away any unused medicine after the expiration date. NOTE: This sheet is a summary. It may not cover all possible information. If you have questions about this medicine, talk to your doctor, pharmacist, or health care provider.  2020 Elsevier/Gold Standard (2018-09-25 20:05:16)

## 2019-12-10 NOTE — Progress Notes (Addendum)
PATIENT: Ryan [name] DOB: 18-Sep-1931  REASON FOR VISIT: follow up HISTORY FROM: patient  HISTORY OF PRESENT ILLNESS: Today 12/10/19: Ryan [name] is an 86 year old male with a history of restless leg syndrome and chronic back pain.  He returns today for follow-up.  The patient has been taking Requip and gabapentin but reports it does not offer him much benefit for his restless legs.  He states he can be up 3-4 times at night.  He also has symptoms throughout the day.  He states that gabapentin makes him very drowsy.  He has not taken it for the last 2 nights.  His B12 levels were low back in April.  He has been taking monthly B12 injections but has not had a shot in at least 2 months.  He returns today for an evaluation.   HISTORY 10/18/19:  Ryan [name] is an 86 year old male with a history of restless leg syndrome, CVA and chronic back pain.  He returns today to discuss restless legs.  He states that he is currently taking Requip 2 mg 3 times a day.  He states that he tries to take them 8 hours apart.  He states that he has been waking up during the night with symptoms.  He states that this can occur anytime between 10 and 12 and sometimes will last till 4 in the morning.  He states that a hot shower is sometimes beneficial.  Initially tramadol offered him the most benefit but it no longer works.  He states that he does not have any symptoms during the day.  He returns today for an evaluation.  REVIEW OF SYSTEMS: Out of a complete 14 system review of symptoms, the patient complains only of the following symptoms, and all other reviewed systems are negative.  See HPI  ALLERGIES: Allergies  Allergen Reactions  . Pollen Extract-Tree Extract Other (See Comments)    HEADACHES, TIRED , DRAINAGE FROM SINUSES  . Molds & Smuts Other (See Comments)    Also dust mites causes sinus infections, h/a etc.    HOME MEDICATIONS: Outpatient Medications Prior to Visit  Medication Sig Dispense  Refill  . Acetaminophen (TYLENOL PO) Take by mouth. Takes with Aleve three times daily.    Marland Kitchen allopurinol (ZYLOPRIM) 100 MG tablet Take 50 mg by mouth daily.    Marland Kitchen atorvastatin (LIPITOR) 40 MG tablet Take 1 tablet (40 mg total) by mouth daily at 6 PM. 30 tablet 1  . cyanocobalamin (,VITAMIN B-12,) 1000 MCG/ML injection One ml injection intramuscular once a week for 3 weeks and then monthly thereafter. 1 mL 14  . diltiazem (CARDIZEM CD) 120 MG 24 hr capsule Take 1 capsule (120 mg total) by mouth daily. 90 capsule 1  . ELIQUIS 2.5 MG TABS tablet TAKE ONE TABLET TWICE DAILY 60 tablet 5  . escitalopram (LEXAPRO) 10 MG tablet Take 10 mg by mouth daily.    . finasteride (PROSCAR) 5 MG tablet Take 5 mg by mouth daily.    Marland Kitchen FLONASE SENSIMIST 27.5 MCG/SPRAY nasal spray Place 1 spray into the nose daily as needed for rhinitis or allergies.     Marland Kitchen gabapentin (NEURONTIN) 100 MG capsule Take 3 capsules (300 mg total) by mouth at bedtime. 90 capsule 1  . ipratropium (ATROVENT) 0.03 % nasal spray Place 2 sprays into both nostrils 2 (two) times daily.    Marland Kitchen latanoprost (XALATAN) 0.005 % ophthalmic solution Place 1 drop into both eyes at bedtime.     Marland Kitchen linaclotide Rolan Lipa)  290 MCG CAPS capsule Take 290 mcg by mouth daily before breakfast.    . methocarbamol (ROBAXIN) 500 MG tablet Take 500-1,000 mg by mouth every 8 (eight) hours as needed for muscle spasms.    . methylcellulose (CITRUCEL) oral powder Mix 2 grams in 8 ounces of water and drink twice a day. 454 g 0  . Naproxen Sodium (ALEVE PO) Take by mouth. Takes with Tylenol three times daily    . omeprazole (PRILOSEC) 20 MG capsule Take 20 mg by mouth daily.    . potassium chloride (KLOR-CON) 10 MEQ tablet Take 1 tablet (10 mEq total) by mouth daily. 90 tablet 3  . Riociguat (ADEMPAS) 2.5 MG TABS Take 2.5 mg by mouth 3 (three) times daily.    Marland Kitchen rOPINIRole (REQUIP) 2 MG tablet Take 1 tablet (2 mg total) by mouth in the morning, at noon, and at bedtime. 90 tablet 3   . Selexipag 1600 MCG TABS Take 1 tablet (1,600 mcg total) by mouth 2 (two) times daily. 60 tablet 6  . spironolactone (ALDACTONE) 25 MG tablet Take 0.5 tablets (12.5 mg total) by mouth daily. 15 tablet 3  . tiZANidine (ZANAFLEX) 2 MG tablet Take 2 mg by mouth 2 (two) times daily as needed for muscle spasms.    Marland Kitchen torsemide (DEMADEX) 20 MG tablet Take 4 tablets (80 mg total) by mouth 2 (two) times daily. 270 tablet 3  . traMADol (ULTRAM) 50 MG tablet Take 50 mg by mouth every 6 (six) hours as needed for moderate pain.     . traZODone (DESYREL) 100 MG tablet Take 100 mg by mouth at bedtime.     No facility-administered medications prior to visit.    PAST MEDICAL HISTORY: Past Medical History:  Diagnosis Date  . Asthma   . Barrett's esophagus   . Benign prostatic hypertrophy   . Bilateral lower extremity edema   . CHF (congestive heart failure) ([place])   . Chronic fatigue   . COPD (chronic obstructive pulmonary disease) ([place])   . Diabetes mellitus type 2 in nonobese ([place]) 04/24/2019  . Diverticulosis of colon (without mention of hemorrhage)   . Esophageal reflux   . Gastritis   . Gluten intolerance   . Hiatal hernia 02/10/11   Noted on CT Scan - Moderate Hiatal Hernia  . Hyperlipemia   . Hypertension   . 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 Melanoma ([place])    lymphoma  . Microcytic anemia 04/20/2011   . 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 Osteoarthritis    hands/feet,knees, NECK, BACK  . Periaortic lymphadenopathy 02/16/2011  . Personal history of colonic polyps 2004   hyperplastic Dr. Collene Mares  . Pulmonary hyperinflation   . Renal cyst 02/10/11    Noted on CT Scan - Bilateral Renal Cysts  . S/P radiation therapy 03/15/11 - 04/09/11   Abdominal/ Pelvic Tumor, 3600 cGy/20 Fractions    PAST SURGICAL HISTORY: Past Surgical History:  Procedure Laterality Date  . ARTHROSCOPIC  REPAIR ACL     right  . BONE MARROW ASPIRATION  02/25/11   Bone Marrow, Aspirate, Clot, and Bilateral Bx, Right PIC  . CATARACT EXTRACTION, BILATERAL    . EYE SURGERY    . HERNIA REPAIR     LEFT INGUINAL   . INSERTION OF MESH N/A 08/23/2012   Procedure: INSERTION OF MESH;  Surgeon: Madilyn Hook, DO;  Location: [facility]  ORS;  Service: General;  Laterality: N/A;  . LOOP RECORDER INSERTION N/A 04/26/2019   Procedure: LOOP RECORDER INSERTION;  Surgeon: Evans Lance, MD;  Location: [facility];  Service: Cardiovascular;  Laterality: N/A;  . LUMBAR LAMINECTOMY/DECOMPRESSION MICRODISCECTOMY Right 12/31/2013   Procedure: RIGHT L4-5 L5-S1 LAMINECTOMY;  Surgeon: Kristeen Miss, MD;  Location: [facility] ORS;  Service: Neurosurgery;  Laterality: Right;  RIGHT L4-5 L5-S1 LAMINECTOMY  . RIGHT HEART CATH N/A 07/25/2018   Procedure: RIGHT HEART CATH;  Surgeon: Larey Dresser, MD;  Location: [facility];  Service: Cardiovascular;  Laterality: N/A;  . ROTATOR CUFF REPAIR     right  . TONSILLECTOMY    . TONSILLECTOMY    . VENTRAL HERNIA REPAIR N/A 08/23/2012   Procedure: HERNIA REPAIR VENTRAL ADULT;  Surgeon: Madilyn Hook, DO;  Location: [facility] ORS;  Service: General;  Laterality: N/A;    FAMILY HISTORY: Family History  Problem Relation Age of Onset  . Heart disease Father        MI 12  . Urticaria Father   . Prostate cancer Brother   . Prostate cancer Paternal Uncle   . Prostate cancer Paternal Uncle   . Prostate cancer Paternal Uncle   . Hyperlipidemia Other   . Stroke Other   . Hypertension Other   . ADD / ADHD Other   . Colon cancer Neg Hx   . Allergic rhinitis Neg Hx   . Asthma Neg Hx   . Eczema Neg Hx     SOCIAL HISTORY: Social History   Socioeconomic History  . Marital status: Married    Spouse name: Not on file  . Number of children: 2  . Years of education: Not on file  . Highest education level: Not on file  Occupational History  . 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 Smoking status: Former Smoker    Packs/day: 1.00    Years: 35.00    Pack years: 35.00    Types: Pipe, Cigarettes    Quit date: 02/23/1992    Years since quitting: 27.8  . Smokeless tobacco: Never Used  . Tobacco comment: quit 20 years ago  Vaping Use  . Vaping Use: Never used  Substance and Sexual Activity  . Alcohol use: Not Currently    Comment: 2 drinks daily scotch  AND WINE WITH SUPPER  . Drug use: No  . Sexual activity: Not on file  Other Topics Concern  . Not on file  Social History Narrative   Married - second marriage   He has two children   Retired Horticulturist, commercial Professor   Currently teaches pottery making, 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[facility]   Former Smoker quit 12 years ago- smoked for 35 years   Alcohol use- not currently   Right handed   Social Determinants of Radio broadcast assistant Strain:   . Difficulty of Paying Living Expenses: Not on file  Food Insecurity:   . Worried About Charity fundraiser in the Last Year: Not on file  . Ran Out of Food in the Last Year: Not on file  Transportation Needs:   . Lack of Transportation (Medical): Not on file  . Lack of Transportation (Non-Medical): Not on file  Physical Activity:   . Days of Exercise per Week: Not on file  . Minutes of Exercise per Session: Not on file  Stress:   . Feeling of Stress : Not  on file  Social Connections:   . Frequency of Communication with Friends and Family: Not on file  . Frequency of Social Gatherings with Friends and Family: Not on file  . Attends Religious Services: Not on file  . Active Member of Clubs or Organizations: Not on file  . Attends Archivist Meetings: Not on file  . Marital Status: Not on file  Intimate Partner Violence:   . Fear of Current or Ex-Partner: Not on file  . Emotionally Abused: Not on file  . Physically Abused: Not on file  . Sexually Abused: Not on file      PHYSICAL EXAM  Vitals:    12/10/19 0928  BP: 116/72  Weight: 164 lb (74.4 kg)  Height: 5' 7.25" (1.708 m)   Body mass index is 25.5 kg/m.  Generalized: Well developed, in no acute distress   Neurological examination  Mentation: Alert oriented to time, place, history taking. Follows all commands speech and language fluent Cranial nerve II-XII: Pupils were equal round reactive to light. Extraocular movements were full, visual field were full on confrontational test.  Head turning and shoulder shrug  were normal and symmetric. Motor: The motor testing reveals 5 over 5 strength of all 4 extremities. Good symmetric motor tone is noted throughout. 4+ pitting edema bilateral lower extremities Sensory: Sensory testing is intact to soft touch on all 4 extremities. No evidence of extinction is noted.  Coordination: Cerebellar testing reveals good finger-nose-finger and heel-to-shin bilaterally.  Gait and station: Uses a Rollator when ambulating. Reflexes: Deep tendon reflexes are symmetric and normal bilaterally.   DIAGNOSTIC DATA (LABS, IMAGING, TESTING) - I reviewed patient records, labs, notes, testing and imaging myself where available.  Lab Results  Component Value Date   WBC 7.2 07/13/2019   HGB 10.4 (L) 07/13/2019   HCT 35.2 (L) 07/13/2019   MCV 97.5 07/13/2019   PLT 291 07/13/2019      Component Value Date/Time   NA 136 10/22/2019 1556   NA 144 03/16/2017 1137   NA 143 06/26/2015 0916   K 4.5 10/22/2019 1556   K 3.8 06/26/2015 0916   CL 99 10/22/2019 1556   CL 105 04/07/2012 0925   CO2 25 10/22/2019 1556   CO2 32 (H) 06/26/2015 0916   GLUCOSE 109 (H) 10/22/2019 1556   GLUCOSE 102 06/26/2015 0916   GLUCOSE 86 04/07/2012 0925   BUN 58 (H) 10/22/2019 1556   BUN 19 03/16/2017 1137   BUN 14.0 06/26/2015 0916   CREATININE 1.70 (H) 10/22/2019 1556   CREATININE 1.0 06/26/2015 0916   CALCIUM 8.3 (L) 10/22/2019 1556   CALCIUM 8.9 06/26/2015 0916   PROT 6.8 07/13/2019 1047   PROT 6.2 (L) 06/26/2015 0916    ALBUMIN 3.9 07/13/2019 1047   ALBUMIN 3.5 06/26/2015 0916   AST 11 (L) 07/13/2019 1047   AST 17 06/26/2015 0916   ALT 6 07/13/2019 1047   ALT 15 06/26/2015 0916   ALKPHOS 70 07/13/2019 1047   ALKPHOS 87 06/26/2015 0916   BILITOT 0.8 07/13/2019 1047   BILITOT 0.50 06/26/2015 0916   GFRNONAA 36 (L) 10/22/2019 1556   GFRAA 42 (L) 10/22/2019 1556   Lab Results  Component Value Date   CHOL 159 09/24/2019   HDL 45 09/24/2019   LDLCALC 82 09/24/2019   LDLDIRECT 165.7 12/23/2008   TRIG 159 (H) 09/24/2019   CHOLHDL 3.5 09/24/2019   Lab Results  Component Value Date   HGBA1C 5.9 (H) 04/25/2019   Lab Results  Component Value Date   VITAMINB12 231 (L) 05/28/2019   Lab Results  Component Value Date   TSH 1.444 06/11/2011      ASSESSMENT AND PLAN 86 y.o. year old male  has a past medical history of Asthma, Barrett's esophagus, Benign prostatic hypertrophy, Bilateral lower extremity edema, CHF (congestive heart failure) ([place]), Chronic fatigue, COPD (chronic obstructive pulmonary disease) ([place]), Diabetes mellitus type 2 in nonobese ([place]) (04/24/2019), Diverticulosis of colon (without mention of hemorrhage), Esophageal reflux, Gastritis, Gluten intolerance, Hiatal hernia (02/10/11), Hyperlipemia, Hypertension, Lymphoma of lymph nodes in pelvis ([place]) (03/03/2011), Melanoma ([place]), Microcytic anemia (04/20/2011 ), NHL (non-Hodgkin's lymphoma) ([place]), Osteoarthritis, Periaortic lymphadenopathy (02/16/2011), Personal history of colonic polyps (2004), Pulmonary hyperinflation, Renal cyst (02/10/11), and S/P radiation therapy (03/15/11 - 04/09/11). here with:  1.  Restless leg syndrome  -We will recheck vitamin B12 levels as his levels were low in April -Previous iron and ferritin levels was in normal range -Stop Requip -Start Mirapex 0.125 mg 3 times a day -Pitting edema noted in the lower extremities patient is following with cardiology and pulmonology -Advised if symptoms do not improve he should  let us know -Follow-up in 3 to 4 months or sooner if needed   I spent 30 minutes of face-to-face and non-face-to-face time with patient.  This included previsit chart review, lab review, study review, order entry, electronic health record documentation, patient education.  Ward Givens, MSN, NP-C 12/10/2019, 9:14 AM [facility] [address], [place], [place] [address] [phone]  Made any corrections needed, and agree with history, physical, neuro exam,assessment and plan as stated.     Sarina Ill, MD [facility]

## 2019-12-11 ENCOUNTER — Telehealth: Payer: Self-pay | Admitting: Adult Health

## 2019-12-11 LAB — COMPREHENSIVE METABOLIC PANEL
ALT: 10 IU/L (ref 0–44)
AST: 10 IU/L (ref 0–40)
Albumin/Globulin Ratio: 2 (ref 1.2–2.2)
Albumin: 4.4 g/dL (ref 3.6–4.6)
Alkaline Phosphatase: 100 IU/L (ref 44–121)
BUN/Creatinine Ratio: 27 — ABNORMAL HIGH (ref 10–24)
BUN: 56 mg/dL — ABNORMAL HIGH (ref 8–27)
Bilirubin Total: <0.2 mg/dL (ref 0.0–1.2)
CO2: 21 mmol/L (ref 20–29)
Calcium: 8.4 mg/dL — ABNORMAL LOW (ref 8.6–10.2)
Chloride: 101 mmol/L (ref 96–106)
Creatinine, Ser: 2.11 mg/dL — ABNORMAL HIGH (ref 0.76–1.27)
GFR calc Af Amer: 32 mL/min/{1.73_m2} — ABNORMAL LOW (ref >59)
GFR calc non Af Amer: 28 mL/min/{1.73_m2} — ABNORMAL LOW (ref >59)
Globulin, Total: 2.2 g/dL (ref 1.5–4.5)
Glucose: 86 mg/dL (ref 65–99)
Potassium: 5.3 mmol/L — ABNORMAL HIGH (ref 3.5–5.2)
Sodium: 139 mmol/L (ref 134–144)
Total Protein: 6.6 g/dL (ref 6.0–8.5)

## 2019-12-11 LAB — CBC WITH DIFFERENTIAL/PLATELET
Basophils Absolute: 0 10*3/uL (ref 0.0–0.2)
Basos: 0 %
EOS (ABSOLUTE): 0.3 10*3/uL (ref 0.0–0.4)
Eos: 4 %
Hematocrit: 33.2 % — ABNORMAL LOW (ref 37.5–51.0)
Hemoglobin: 9.7 g/dL — ABNORMAL LOW (ref 13.0–17.7)
Immature Grans (Abs): 0 10*3/uL (ref 0.0–0.1)
Immature Granulocytes: 1 %
Lymphocytes Absolute: 1.5 10*3/uL (ref 0.7–3.1)
Lymphs: 21 %
MCH: 24.7 pg — ABNORMAL LOW (ref 26.6–33.0)
MCHC: 29.2 g/dL — ABNORMAL LOW (ref 31.5–35.7)
MCV: 85 fL (ref 79–97)
Monocytes Absolute: 0.5 10*3/uL (ref 0.1–0.9)
Monocytes: 6 %
Neutrophils Absolute: 5.1 10*3/uL (ref 1.4–7.0)
Neutrophils: 68 %
Platelets: 328 10*3/uL (ref 150–450)
RBC: 3.92 x10E6/uL — ABNORMAL LOW (ref 4.14–5.80)
RDW: 15.8 % — ABNORMAL HIGH (ref 11.6–15.4)
WBC: 7.5 10*3/uL (ref 3.4–10.8)

## 2019-12-11 LAB — VITAMIN B12: Vitamin B-12: 817 pg/mL (ref 232–1245)

## 2019-12-11 MED ORDER — NEUPRO 1 MG/24HR TD PT24: 1.0000 mg | MEDICATED_PATCH | TRANSDERMAL | 5 refills | Status: DC

## 2019-12-11 MED ORDER — ROPINIROLE HCL 2 MG PO TABS: ORAL_TABLET | ORAL | 0 refills | Status: DC

## 2019-12-11 NOTE — Telephone Encounter (Signed)
I called the patient.  Advised that his creatinine level had increased to 2.11.  For that reason we will not start Mirapex.  He will start on Neupro patch 1 mg daily.  He is advised that he has Requip 2 mg tablets at home.  He can take up to 2 tablets daily for breakthrough symptoms for the next 1 to 2 weeks while starting Neupro.  Patient voiced understanding.  I will forward blood work to his PCP-RBC, hematocrit and hemoglobin also low.  Discussed with Dr. Lavell Anchors she is also in agreement with plan

## 2019-12-11 NOTE — Telephone Encounter (Signed)
Done.

## 2019-12-14 ENCOUNTER — Telehealth: Payer: Self-pay | Admitting: Diagnostic Neuroimaging

## 2019-12-14 LAB — CUP PACEART REMOTE DEVICE CHECK
Date Time Interrogation Session: 20211108230635
Implantable Pulse Generator Implant Date: 20210325

## 2019-12-14 NOTE — Telephone Encounter (Signed)
Patient called with ongoing RLS sxs. Only tried patch today for first time. Recommend to continue neupro patch through the weekend to see if it starts to work better after a few days. -VRP

## 2019-12-17 ENCOUNTER — Ambulatory Visit (INDEPENDENT_AMBULATORY_CARE_PROVIDER_SITE_OTHER): Payer: Medicare HMO

## 2019-12-17 DIAGNOSIS — I639 Cerebral infarction, unspecified: Secondary | ICD-10-CM | POA: Diagnosis not present

## 2019-12-18 NOTE — Progress Notes (Signed)
Carelink Summary Report / Loop Recorder

## 2019-12-25 ENCOUNTER — Encounter (HOSPITAL_COMMUNITY): Payer: Self-pay | Admitting: Cardiology

## 2019-12-25 ENCOUNTER — Ambulatory Visit (HOSPITAL_COMMUNITY)
Admission: RE | Admit: 2019-12-25 | Discharge: 2019-12-25 | Disposition: A | Discharge status: Home / Self Care | Payer: Medicare HMO | Source: Ambulatory Visit | Attending: Cardiology | Admitting: Cardiology

## 2019-12-25 ENCOUNTER — Other Ambulatory Visit: Payer: Self-pay

## 2019-12-25 VITALS — BP 110/62 | HR 81 | Wt 183.0 lb

## 2019-12-25 DIAGNOSIS — Z87891 Personal history of nicotine dependence: Secondary | ICD-10-CM | POA: Diagnosis not present

## 2019-12-25 DIAGNOSIS — J8410 Pulmonary fibrosis, unspecified: Secondary | ICD-10-CM | POA: Diagnosis not present

## 2019-12-25 DIAGNOSIS — Z9989 Dependence on other enabling machines and devices: Secondary | ICD-10-CM | POA: Diagnosis not present

## 2019-12-25 DIAGNOSIS — I2721 Secondary pulmonary arterial hypertension: Secondary | ICD-10-CM | POA: Insufficient documentation

## 2019-12-25 DIAGNOSIS — Z8572 Personal history of non-Hodgkin lymphomas: Secondary | ICD-10-CM | POA: Insufficient documentation

## 2019-12-25 DIAGNOSIS — N183 Chronic kidney disease, stage 3 unspecified: Secondary | ICD-10-CM | POA: Insufficient documentation

## 2019-12-25 DIAGNOSIS — I5032 Chronic diastolic (congestive) heart failure: Secondary | ICD-10-CM | POA: Diagnosis not present

## 2019-12-25 DIAGNOSIS — Z7901 Long term (current) use of anticoagulants: Secondary | ICD-10-CM | POA: Insufficient documentation

## 2019-12-25 DIAGNOSIS — I272 Pulmonary hypertension, unspecified: Secondary | ICD-10-CM | POA: Diagnosis not present

## 2019-12-25 DIAGNOSIS — Z9981 Dependence on supplemental oxygen: Secondary | ICD-10-CM | POA: Diagnosis not present

## 2019-12-25 DIAGNOSIS — G4733 Obstructive sleep apnea (adult) (pediatric): Secondary | ICD-10-CM | POA: Diagnosis not present

## 2019-12-25 DIAGNOSIS — I480 Paroxysmal atrial fibrillation: Secondary | ICD-10-CM | POA: Diagnosis not present

## 2019-12-25 DIAGNOSIS — I130 Hypertensive heart and chronic kidney disease with heart failure and stage 1 through stage 4 chronic kidney disease, or unspecified chronic kidney disease: Secondary | ICD-10-CM | POA: Insufficient documentation

## 2019-12-25 DIAGNOSIS — Z79899 Other long term (current) drug therapy: Secondary | ICD-10-CM | POA: Diagnosis not present

## 2019-12-25 DIAGNOSIS — Z8249 Family history of ischemic heart disease and other diseases of the circulatory system: Secondary | ICD-10-CM | POA: Insufficient documentation

## 2019-12-25 DIAGNOSIS — Z8673 Personal history of transient ischemic attack (TIA), and cerebral infarction without residual deficits: Secondary | ICD-10-CM | POA: Insufficient documentation

## 2019-12-25 LAB — BASIC METABOLIC PANEL
Anion gap: 14 (ref 5–15)
BUN: 49 mg/dL — ABNORMAL HIGH (ref 8–23)
CO2: 28 mmol/L (ref 22–32)
Calcium: 8.7 mg/dL — ABNORMAL LOW (ref 8.9–10.3)
Chloride: 98 mmol/L (ref 98–111)
Creatinine, Ser: 1.77 mg/dL — ABNORMAL HIGH (ref 0.61–1.24)
GFR, Estimated: 37 mL/min — ABNORMAL LOW (ref >60)
Glucose, Bld: 79 mg/dL (ref 70–99)
Potassium: 5 mmol/L (ref 3.5–5.1)
Sodium: 140 mmol/L (ref 135–145)

## 2019-12-25 MED ORDER — TORSEMIDE 20 MG PO TABS
100.0000 mg | ORAL_TABLET | Freq: Two times a day (BID) | ORAL | 3 refills | Status: DC
Start: 2019-12-25 — End: 2020-05-16

## 2019-12-25 NOTE — Progress Notes (Signed)
6 Min Walk Test Completed  Pt ambulated 800 ft (243.8 m) O2 Sat ranged 90-97% on RA HR ranged 76-102

## 2019-12-25 NOTE — Progress Notes (Signed)
PCP: Roetta Sessions, NP Cardiology: Dr. Aundra Dubin  86 y.o. with history of interstitial lung disease (?NSIP vs UIP) on home oxygen, pulmonary hypertension, diastolic CHF, and prior non-Hodgkins lymphoma (pelvic mass, s/p XRT) presents for followup of CHF and pulmonary hypertension.  Patient has history of reportedly mild ILD (NSIP vs UIP) followed by a pulmonologist at [facility] and pulmonary hypertension thought to be out of proportion lung disease followed by a physician at [facility].  Last CT chest was in 11/19, showing stable ILD (?UIP vs NSIP).  He had RHC in 2/20 with moderate PAH, PVR 5.1 WU.  He was tried on Adcirca but developed cognitive problems and worsening of peripheral edema so this was stopped.  He has been on home oxygen, uses at night and with exertion.   He was admitted in 6/20 with worsening dyspnea and peripheral edema, weight up despite compliance with torsemide 40 mg bid at home.  He has CKD stage 3 and torsemide was decreased in the spring. He was diuresed aggressively with fall in weight.  Echo showed normal LV size and systolic function, RV reportedly normal.  6/20 RHC showed normal filling pressures with moderate pulmonary hypertension, PVR 5.35 WU.   After discharge from hospital, patient started on ambrisentan.  His weight steadily began to increase and he developed worsening lower extremity edema, we stopped ambrisentan.   Patient was admitted in 8/20 with AKI after taking metolazone for several days.  He was admitted again in 8/20 with acute on chronic diastolic CHF. He was diuresed.  Wandering atrial pacemaker noted.   He was admitted in 3/21 with suspected CVA, MRI head showed punctate occipital lobe infarct, thought to be due to small vessel disease. ILR was implanted.  Echo in 3/21 showed EF 60-65%, severe LVH, normal RV.  PYP scan in 3/21 was not suggestive of TTR amyloidosis.  Subsequently, ILR has shown paroxysmal atrial fibrillation.   He was admitted later in  3/21 with AKI, creatinine up to 5.  However, he corrected rapidly to baseline with IVF and creatinine was 1.02 when discharge.   He returns for followup of CHF and pulmonary hypertension. He is using CPAP.  No problems walking for short distances on flat ground.  Uses walker for balance. Short of breath with longer distances.  Weight is up 8 lbs.  No chest pain.  No lightheadedness.  Increased ankle swelling, cannot wear compression stockings due to restless leg syndrome. Of note, he has been taking Naproxen 3 times a day.   6 minute walk (8/21): 122 m 6 minute walk (11/21): 244 m  Labs (6/20): K 4.7, creatinine 1.46 Labs (7/20): K 3.1, creatinine 1.67 Labs (8/20): K 3.9, creatinine 1.8 Labs (9/20): K 4.9, creatinine 1.76, BNP 78 Labs (10/20): K 4.2, creatininine 1.5 Labs (11/20): BNP 36, K 4.3, creatinine 1.4 Labs (3/21): myeloma panel negative, urine immunofixation negative.  Labs (4/21): K 4.5, creatinine 1.02, hgb 11.4 Labs (6/21): K 5.1, creatinine 2.2, hgb 10.4 Labs (8/21): LDL 82 Labs (11/21): K 5.3, creatinine 2.11  PMH: 1. Non-Hodgkin's lymphoma: Pelvic involvement.  S/p radiation.  Thought to be in remission (Dr. Benay Spice).  2. HTN 3. PACs/PVCs 4. GERD: Barrett's esophagus.  5. 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  - High resolution CT chest 11/19: ILD, ?UIP pattern.  6. Pulmonary hypertension: Thought to be out of proportion to underlying lung disease (ILD, ?NSIP vs UIP).   - RHC (2/20): mean RA 10, PASP 60 with mean 42, mean PCWP 11, CI 3,  PVR 5.1 WU.  - Trial of Adcirca => failed due to ?memory worsening/cognitive affects and peripheral edema.  - Echo (6/20): EF 60-65%, mild LVH, RV reportedly normal, PASP 59 mmHg.  - RHC (6/20): mean RA 7, PA 50/24 mean 34, mean PCWP 12, CI 2.01, PAPI 3.7, PVR 5.35 WU - RF negative, ANA negative, anti-SCL70 negative, anti-centromere negative.  - V/Q scan (8/20): No evidence for chronic PE.  - Echo (3/21): EF 60-65%, severe LVH, normal RV.   7. CKD: Stage 3.   8. Wandering atrial pacemaker 9. OSA 10. PYP scan (3/21): H/CL ratio 1.0, grade 0.  11. CVA: MRI 3/21 with punctate occipital lobe infarct thought to be due to small vessel disease. - Carotid dopplers (3/21): 40-59% RICA stenosis.  12. Atrial fibrillation: Paroxysmal, noted on ILR.   Social History   Socioeconomic History  . Marital status: Married    Spouse name: Not on file  . Number of children: 2  . Years of education: Not on file  . Highest education level: Not on file  Occupational History  . 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 Smoking status: Former Smoker    Packs/day: 1.00    Years: 35.00    Pack years: 35.00    Types: Pipe, Cigarettes    Quit date: 02/23/1992    Years since quitting: 27.8  . Smokeless tobacco: Never Used  . Tobacco comment: quit 20 years ago  Vaping Use  . Vaping Use: Never used  Substance and Sexual Activity  . Alcohol use: Not Currently    Comment: 2 drinks daily scotch  AND WINE WITH SUPPER  . Drug use: No  . Sexual activity: Not on file  Other Topics Concern  . Not on file  Social History Narrative   Married - second marriage   He has two children   Retired Horticulturist, commercial Professor   Currently teaches pottery making, 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[facility]   Former Smoker quit 12 years ago- smoked for 35 years   Alcohol use- not currently   Right handed   Social Determinants of Radio broadcast assistant Strain:   . Difficulty of Paying Living Expenses: Not on file  Food Insecurity:   . Worried About Charity fundraiser in the Last Year: Not on file  . Ran Out of Food in the Last Year: Not on file  Transportation Needs:   . Lack of Transportation (Medical): Not on file  . Lack of Transportation (Non-Medical): Not on file  Physical Activity:   . Days of Exercise per Week: Not on file  . Minutes of Exercise per Session: Not on file  Stress:   . Feeling of Stress : Not on  file  Social Connections:   . Frequency of Communication with Friends and Family: Not on file  . Frequency of Social Gatherings with Friends and Family: Not on file  . Attends Religious Services: Not on file  . Active Member of Clubs or Organizations: Not on file  . Attends Archivist Meetings: Not on file  . Marital Status: Not on file  Intimate Partner Violence:   . Fear of Current or Ex-Partner: Not on file  . Emotionally Abused: Not on file  . Physically Abused: Not on file  . Sexually Abused: Not on file   Family History  Problem Relation Age of Onset  . Heart disease Father  MI 69  . Urticaria Father   . Prostate cancer Brother   . Prostate cancer Paternal Uncle   . Prostate cancer Paternal Uncle   . Prostate cancer Paternal Uncle   . Hyperlipidemia Other   . Stroke Other   . Hypertension Other   . ADD / ADHD Other   . Colon cancer Neg Hx   . Allergic rhinitis Neg Hx   . Asthma Neg Hx   . Eczema Neg Hx    ROS: All systems reviewed and negative except as per HPI.   Current Outpatient Medications  Medication Sig Dispense Refill  . Acetaminophen (TYLENOL PO) Take by mouth. Takes with Aleve three times daily.    Marland Kitchen allopurinol (ZYLOPRIM) 100 MG tablet Take 50 mg by mouth daily.    Marland Kitchen atorvastatin (LIPITOR) 40 MG tablet Take 1 tablet (40 mg total) by mouth daily at 6 PM. 30 tablet 1  . cyanocobalamin (,VITAMIN B-12,) 1000 MCG/ML injection One ml injection intramuscular once a week for 3 weeks and then monthly thereafter. 1 mL 14  . diclofenac Sodium (VOLTAREN) 1 % GEL as directed 2gm affected joints    . diltiazem (CARDIZEM CD) 120 MG 24 hr capsule Take 1 capsule (120 mg total) by mouth daily. 90 capsule 1  . ELIQUIS 2.5 MG TABS tablet TAKE ONE TABLET TWICE DAILY 60 tablet 5  . escitalopram (LEXAPRO) 10 MG tablet Take 10 mg by mouth daily.    . finasteride (PROSCAR) 5 MG tablet Take 5 mg by mouth daily.    Marland Kitchen FLONASE SENSIMIST 27.5 MCG/SPRAY nasal spray  Place 1 spray into the nose daily as needed for rhinitis or allergies.     Marland Kitchen gabapentin (NEURONTIN) 100 MG capsule Take 3 capsules (300 mg total) by mouth at bedtime. 90 capsule 1  . ipratropium (ATROVENT) 0.03 % nasal spray Place 2 sprays into both nostrils 2 (two) times daily.    Marland Kitchen latanoprost (XALATAN) 0.005 % ophthalmic solution Place 1 drop into both eyes at bedtime.     Marland Kitchen linaclotide (LINZESS) 290 MCG CAPS capsule Take 290 mcg by mouth daily before breakfast.    . methocarbamol (ROBAXIN) 500 MG tablet Take 500-1,000 mg by mouth every 8 (eight) hours as needed for muscle spasms.    . methylcellulose (CITRUCEL) oral powder Mix 2 grams in 8 ounces of water and drink twice a day. 454 g 0  . omeprazole (PRILOSEC) 20 MG capsule Take 20 mg by mouth daily.    . potassium chloride (KLOR-CON) 10 MEQ tablet Take 1 tablet (10 mEq total) by mouth daily. 90 tablet 3  . RESTASIS 0.05 % ophthalmic emulsion 1 drop 2 (two) times daily.    . Riociguat (ADEMPAS) 2.5 MG TABS Take 2.5 mg by mouth 3 (three) times daily.    Marland Kitchen rOPINIRole (REQUIP) 2 MG tablet Can take up to 2 tablets a day for breakthrough symptoms for the next 1-2 weeks while starting neupro patch 8 tablet 0  . Rotigotine (NEUPRO) 1 MG/24HR PT24 Place 1 patch (1 mg total) onto the skin daily. 30 patch 5  . Selexipag 1600 MCG TABS Take 1 tablet (1,600 mcg total) by mouth 2 (two) times daily. 60 tablet 6  . spironolactone (ALDACTONE) 25 MG tablet Take 0.5 tablets (12.5 mg total) by mouth daily. 15 tablet 3  . tiZANidine (ZANAFLEX) 2 MG tablet Take 2 mg by mouth 2 (two) times daily as needed for muscle spasms.    Marland Kitchen torsemide (DEMADEX) 20 MG tablet Take  5 tablets (100 mg total) by mouth 2 (two) times daily. 300 tablet 3  . traMADol (ULTRAM) 50 MG tablet Take 50 mg by mouth every 6 (six) hours as needed for moderate pain.     . traZODone (DESYREL) 100 MG tablet Take 100 mg by mouth at bedtime.     No current facility-administered medications for this  encounter.   BP 110/62   Pulse 81   Wt 83 kg (183 lb)   SpO2 95%   BMI 28.45 kg/m  General: NAD Neck: JVP 8-9 cm, no thyromegaly or thyroid nodule.  Lungs: Crackles at bases bilaterally. CV: Nondisplaced PMI.  Heart regular S1/S2, no S3/S4, no murmur. 2+ ankle edema.  No carotid bruit.  Normal pedal pulses.  Abdomen: Soft, nontender, no hepatosplenomegaly, no distention.  Skin: Intact without lesions or rashes.  Neurologic: Alert and oriented x 3.  Psych: Normal affect. Extremities: No clubbing or cyanosis.  HEENT: Normal.   Assessment/Plan: 1. Chronic diastolic CHF: Suspect with significant component of RV failure from pulmonary hypertension. Echo in 6/20 with EF 60-65%, RV not well-visualized but PASP 59 mmHg.  Echo (3/21) with EF 60-65%, severe LVH, normal RV, PASP 28 mmHg.  PYP scan was not suggestive of TTR cardiac amyloidosis.  NYHA class III symptoms.  6 minute walk significantly improved but he is volume overloaded on exam with weight up.     - Increase torsemide to 100 mg bid with BMET today and in 10 days.    - Continue spironolactone 25 mg daily but need to stop KCl supplement.  2. CKD Stage 3: He has been taking Naproxen tid.  - Needs to stop Naproxen, should not use any NSAIDs.  He can use Tylenol or tramadol for pain.  - BMET today.  3. Pulmonary hypertension: Patient thought to have [place] out of proportion to his lung disease (ILD, NSIP versus UIP). RHC in 2/20 showed mean PA pressure 41, PVR 5.1 WU, CI 3. Echo in 6/20 with RV reportedly ok =>I reviewed and do not think that RV was well-visualized. Patient did not tolerate Adcirca due to edema/?cognitive effects.  RHC was done again in 6/20, showed moderate pulmonary arterial hypertension with PVR 5.35 (comparable to prior).  Serological workup for PH was negative. Patient started ambrisentan 5 mg and developed significant edema/volume overload despite a high dose of torsemide. He is now on Uptravi + riociguat.  Most recent  echo in 3/21 showed normal RV size/systolic function and PA systolic pressure estimate was normal.  6 minute walk is significantly improved today though he is volume overloaded.  - he will stay off ambrisentan (?trigger for volume overload).  - As above, he did not tolerate tadalafil.  - Continue Uptravi, now up to 1600 mcg bid. Can consider increase in future but would like to get volume under control first.  - Continue riociguat at goal dose 2.5 mg tid.  4. Atrial fibrillation: Paroxysmal, noted on ILR. NSR today.  - Continue apixaban 2.5 mg bid (dosed for age, renal dysfunction).  5. OSA: Continue CPAP.  6. CVA: In 3/21, may have been atrial fibrillation-related.  On apixaban now.  7. Carotid stenosis: 40-59% RICA stenosis on 3/21 dopplers.  - Repeat carotid dopplers in 3/22.  8. Interstitial lung disease: Needs pulmonary followup.  - Refer to [facility] pulmonary today.   Followup 10 days with APP to reassess volume.   Loralie Champagne 12/25/2019

## 2019-12-25 NOTE — Patient Instructions (Signed)
Increase Torsemide to 100 mg (5 tabs) Twice daily   DO NOT TAKE ANY MORE NAPROXEN/IBUPROFEN  Labs done today, your results will be available in MyChart, we will contact you for abnormal readings.  Your physician recommends that you schedule a follow-up appointment in: 1-2 weeks with lab work  If you have any questions or concerns before your next appointment please send [place] a message through [place] or call our office at [phone].    TO LEAVE A MESSAGE FOR THE NURSE SELECT OPTION 2, PLEASE LEAVE A MESSAGE INCLUDING: . YOUR NAME . DATE OF BIRTH . CALL BACK NUMBER . REASON FOR CALL**this is important as we prioritize the call backs  [place] AS LONG AS YOU CALL BEFORE 4:00 PM  At the [facility], you and your health needs are our priority. As part of our continuing mission to provide you with exceptional heart care, we have created designated Provider Care Teams. These Care Teams include your primary Cardiologist (physician) and Advanced Practice Providers (APPs- Physician Assistants and Nurse Practitioners) who all work together to provide you with the care you need, when you need it.   You may see any of the following providers on your designated Care Team at your next follow up: Marland Kitchen Dr Glori Bickers . Dr Loralie Champagne . Darrick Grinder, NP . Lyda Jester, PA . Audry Riles, PharmD   Please be sure to bring in all your medications bottles to every appointment.

## 2019-12-31 ENCOUNTER — Encounter: Payer: Self-pay | Admitting: Pulmonary Disease

## 2019-12-31 ENCOUNTER — Other Ambulatory Visit: Payer: Self-pay

## 2019-12-31 ENCOUNTER — Telehealth: Payer: Self-pay | Admitting: Adult Health

## 2019-12-31 ENCOUNTER — Ambulatory Visit: Payer: Medicare HMO | Admitting: Pulmonary Disease

## 2019-12-31 VITALS — BP 122/62 | HR 78 | Ht 67.0 in | Wt 185.8 lb

## 2019-12-31 DIAGNOSIS — J849 Interstitial pulmonary disease, unspecified: Secondary | ICD-10-CM | POA: Diagnosis not present

## 2019-12-31 MED ORDER — ROPINIROLE HCL 2 MG PO TABS: ORAL_TABLET | ORAL | 0 refills | Status: DC

## 2019-12-31 MED ORDER — NEUPRO 1 MG/24HR TD PT24: 2.0000 mg | MEDICATED_PATCH | TRANSDERMAL | 5 refills | Status: DC

## 2019-12-31 NOTE — Telephone Encounter (Signed)
Relayed to pt that increased patch to 2 mg (use 2 1 mg patches daily for pain).  Renewed #15 tabs for ropinirole 2 mg tabs no refill for breakthru pain. Relayed to pt and he verbalized understanding.

## 2019-12-31 NOTE — Telephone Encounter (Signed)
Pt is needing a refill on his rOPINIRole (REQUIP) 2 MG tablet sent in to the [facility]

## 2019-12-31 NOTE — Addendum Note (Signed)
Addended by: Brandon Melnick on: 12/31/2019 04:13 PM   Modules accepted: Orders

## 2019-12-31 NOTE — Patient Instructions (Signed)
We will schedule high-resolution CT and pulmonary function test as next available for evaluation of the lungs Follow-up in clinic after these tests for review and plan for next steps.

## 2019-12-31 NOTE — Telephone Encounter (Signed)
Please let the patient know that I have increased his Neupro to 2 mg patch.  He should continue trying to decrease his use of Requip.

## 2019-12-31 NOTE — Telephone Encounter (Signed)
Called the pt. In reviewing the chart it appeared per previous notes that the patient was only to use the the requip as needed over the 1-2 weeks while getting used to the neupro patch. When discussing with the patient the neupro patch is helping tremendously but he is still having few breath through attacks. He states that somedays there will 1 attack other days he may have 2. He states that he still is needing something to take during those moments. At night it can become unbearable. I advised the patient that I will send the message to [facility], NP to discuss and decide whether she will send another refill for the patient or what her thoughts are.

## 2019-12-31 NOTE — Progress Notes (Signed)
Ryan [name]    [id number]    08-12-1931  Primary Care Physician:Freeman, Rocco Pauls, NP  Referring Physician: Larey Dresser, MD [phone] [place],  [place] [address]  Chief complaint: Consult for interstitial lung disease  HPI: 86 year old with history of pulmonary hypertension, interstitial lung disease, diastolic heart failure, non-Hodgkin's lymphoma s/p XRT, stage III chronic kidney disease Previously evaluated in 2019 at [facility] pulmonary clinic for mild nonspecific interstitial lung disease at the bases with negative CTD serologies. Also seen at [facility] pulmonary clinic for pulmonary hypertension and interstitial lung disease and was tried on tadalafil, Adcirca which he did not tolerate  Now follows with Dr. Aundra Dubin and is being treated with Malvin Johns and riociguat. Additional medical history includes paroxysmal atrial fibrillation for which she is on apixaban and OSA on CPAP which he is compliant with.  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 No mold, hot tub, Jacuzzi. No feather pillows or comforters Smoking history: 35-pack-year smoker. Quit in 1985 Travel history: No significant travel history Relevant family history: No significant family issue of lung disease  Outpatient Encounter Medications as of 12/31/2019  Medication Sig   Acetaminophen (TYLENOL PO) Take by mouth.    allopurinol (ZYLOPRIM) 100 MG tablet Take 50 mg by mouth daily.   atorvastatin (LIPITOR) 40 MG tablet Take 1 tablet (40 mg total) by mouth daily at 6 PM.   cyanocobalamin (,VITAMIN B-12,) 1000 MCG/ML injection One ml injection intramuscular once a week for 3 weeks and then monthly thereafter.   diltiazem (CARDIZEM CD) 120 MG 24 hr capsule Take 1 capsule (120 mg total) by mouth daily.   ELIQUIS 2.5 MG TABS tablet TAKE ONE TABLET TWICE DAILY    escitalopram (LEXAPRO) 10 MG tablet Take 10 mg by mouth daily.   finasteride (PROSCAR) 5 MG tablet Take 5 mg by mouth daily.   FLONASE SENSIMIST 27.5 MCG/SPRAY nasal spray Place 1 spray into the nose daily as needed for rhinitis or allergies.    gabapentin (NEURONTIN) 100 MG capsule Take 3 capsules (300 mg total) by mouth at bedtime.   ipratropium (ATROVENT) 0.03 % nasal spray Place 2 sprays into both nostrils 2 (two) times daily.   latanoprost (XALATAN) 0.005 % ophthalmic solution Place 1 drop into both eyes at bedtime.    linaclotide (LINZESS) 290 MCG CAPS capsule Take 290 mcg by mouth daily before breakfast.   methocarbamol (ROBAXIN) 500 MG tablet Take 500-1,000 mg by mouth every 8 (eight) hours as needed for muscle spasms.   methylcellulose (CITRUCEL) oral powder Mix 2 grams in 8 ounces of water and drink twice a day.   omeprazole (PRILOSEC) 20 MG capsule Take 20 mg by mouth daily.   potassium chloride (KLOR-CON) 10 MEQ tablet Take 1 tablet (10 mEq total) by mouth daily.   RESTASIS 0.05 % ophthalmic emulsion 1 drop 2 (two) times daily.   Riociguat (ADEMPAS) 2.5 MG TABS Take 2.5 mg by mouth 3 (three) times daily.   rOPINIRole (REQUIP) 2 MG tablet Can take up to 2 tablets a day for breakthrough symptoms for the next 1-2 weeks while starting neupro patch   Rotigotine (NEUPRO) 1 MG/24HR PT24 Place 1 patch (1 mg total) onto the skin daily.   Selexipag 1600 MCG TABS Take 1 tablet (1,600 mcg total) by mouth 2 (two) times daily.   spironolactone (ALDACTONE)  25 MG tablet Take 0.5 tablets (12.5 mg total) by mouth daily.   tiZANidine (ZANAFLEX) 2 MG tablet Take 2 mg by mouth 2 (two) times daily as needed for muscle spasms.   torsemide (DEMADEX) 20 MG tablet Take 5 tablets (100 mg total) by mouth 2 (two) times daily.   traMADol (ULTRAM) 50 MG tablet Take 50 mg by mouth every 6 (six) hours as needed for moderate pain.    traZODone (DESYREL) 100 MG tablet Take 100 mg by mouth at  bedtime.   [DISCONTINUED] diclofenac Sodium (VOLTAREN) 1 % GEL as directed 2gm affected joints   No facility-administered encounter medications on file as of 12/31/2019.    Allergies as of 12/31/2019 - Review Complete 12/31/2019  Allergen Reaction Noted   Pollen extract-tree extract Other (See Comments) 04/30/2011   Molds & smuts Other (See Comments) 02/17/2011    Past Medical History:  Diagnosis Date   Asthma    Barrett's esophagus    Benign prostatic hypertrophy    Bilateral lower extremity edema    CHF (congestive heart failure) (HCC)    Chronic fatigue    COPD (chronic obstructive pulmonary disease) (HCC)    Diabetes mellitus type 2 in nonobese ([facility]) 04/24/2019   Diverticulosis of colon (without mention of hemorrhage)    Esophageal reflux    Gastritis    Gluten intolerance    Hiatal hernia 02/10/11   Noted on CT Scan - Moderate Hiatal Hernia   Hyperlipemia    Hypertension    Lymphoma of lymph nodes in pelvis ([place]) 03/03/2011    Large Right Retroperitoneal Mass   Melanoma ([place])    lymphoma   Microcytic anemia 04/20/2011    NHL (non-Hodgkin's 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 Collene Mares   Pulmonary hyperinflation    Renal cyst 02/10/11    Noted on CT Scan - Bilateral Renal Cysts   S/P radiation therapy 03/15/11 - 04/09/11   Abdominal/ Pelvic Tumor, 3600 cGy/20 Fractions    Past Surgical History:  Procedure Laterality Date   ARTHROSCOPIC REPAIR ACL     right   BONE MARROW ASPIRATION  02/25/11   Bone Marrow, Aspirate, Clot, and Bilateral Bx, Right PIC   CATARACT EXTRACTION, BILATERAL     EYE SURGERY     HERNIA REPAIR     LEFT INGUINAL    INSERTION OF MESH N/A 08/23/2012   Procedure: INSERTION OF MESH;  Surgeon: Madilyn Hook, DO;  Location: [facility];   Service: General;  Laterality: N/A;   LOOP RECORDER INSERTION N/A 04/26/2019   Procedure: LOOP RECORDER INSERTION;  Surgeon: Evans Lance, MD;  Location: [facility];  Service: Cardiovascular;  Laterality: N/A;   LUMBAR LAMINECTOMY/DECOMPRESSION MICRODISCECTOMY Right 12/31/2013   Procedure: RIGHT L4-5 L5-S1 LAMINECTOMY;  Surgeon: Kristeen Miss, MD;  Location: [name];  Service: Neurosurgery;  Laterality: Right;  RIGHT L4-5 L5-S1 LAMINECTOMY   RIGHT HEART CATH N/A 07/25/2018   Procedure: RIGHT HEART CATH;  Surgeon: Larey Dresser, MD;  Location: [facility];  Service: Cardiovascular;  Laterality: N/A;   ROTATOR CUFF REPAIR     right   TONSILLECTOMY     TONSILLECTOMY     VENTRAL HERNIA REPAIR N/A 08/23/2012   Procedure: HERNIA REPAIR VENTRAL ADULT;  Surgeon: Madilyn Hook, DO;  Location: [facility];  Service: General;  Laterality: N/A;    Family History  Problem Relation Age of Onset   Heart disease Father        MI 89   Urticaria Father    Prostate cancer Brother    Prostate cancer Paternal Uncle    Prostate cancer Paternal Uncle    Prostate cancer Paternal Uncle    Hyperlipidemia Other    Stroke Other    Hypertension Other    ADD / ADHD Other    Colon cancer Neg Hx    Allergic rhinitis Neg Hx    Asthma Neg Hx    Eczema Neg Hx     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[employer], shop  Tobacco Use   Smoking status: Former Smoker    Packs/day: 1.00    Years: 35.00    Pack years: 35.00    Types: Pipe, Cigarettes    Quit date: 02/23/1992    Years since quitting: 27.8   Smokeless tobacco: Never Used   Tobacco comment: quit 20 years ago  Vaping Use   Vaping Use: Never used  Substance and Sexual Activity    Alcohol use: Not Currently    Comment: 2 drinks daily scotch  AND WINE WITH SUPPER   Drug use: No   Sexual activity: Not on file  Other Topics Concern   Not on file  Social History Narrative   Married - second marriage   He has two children   Retired Horticulturist, commercial Professor   Currently teaches pottery making, 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[facility]   Former Smoker quit 12 years ago- smoked for 35 years   Alcohol use- not currently   Right handed   Social Determinants of Radio broadcast assistant Strain:    Difficulty of Paying Living Expenses: Not on file  Food Insecurity:    Worried About Charity fundraiser in the Last Year: Not on file   [place] in the Last Year: Not on file  Transportation Needs:    Film/video editor (Medical): Not on file   Lack of Transportation (Non-Medical): Not on file  Physical Activity:    Days of Exercise per Week: Not on file   Minutes of Exercise per Session: Not on file  Stress:    Feeling of Stress : Not on file  Social Connections:    Frequency of Communication with Friends and Family: Not on file   Frequency of Social Gatherings with Friends and Family: Not on file   Attends Religious Services: Not on file   Active Member of Clubs or Organizations: Not on file   Attends Archivist Meetings: Not on file   Marital Status: Not on file  Intimate Partner Violence:    Fear of Current or Ex-Partner: Not on file   Emotionally Abused: Not on file   Physically Abused: Not on file   Sexually Abused: Not on file    Review of systems: Review of Systems  Constitutional: Negative for fever and chills.  HENT: Negative.   Eyes: Negative for blurred vision.  Respiratory: as per HPI  Cardiovascular: Negative for chest pain and palpitations.  Gastrointestinal: Negative for vomiting, diarrhea, blood per rectum. Genitourinary:  Negative for dysuria, urgency, frequency and hematuria.  Musculoskeletal: Negative  for myalgias, back pain and joint pain.  Skin: Negative for itching and rash.  Neurological: Negative for dizziness, tremors, focal weakness, seizures and loss of consciousness.  Endo/Heme/Allergies: Negative for environmental allergies.  Psychiatric/Behavioral: Negative for depression, suicidal ideas and hallucinations.  All other systems reviewed and are negative.  Physical Exam: Blood pressure 122/62, pulse 78, height _0  (1.702 m), weight 185 lb 12.8 oz (84.3 kg), SpO2 97 %. Gen:      No acute distress HEENT:  EOMI, sclera anicteric Neck:     No masses; no thyromegaly Lungs:    Clear to auscultation bilaterally; normal respiratory effort CV:         Regular rate and rhythm; no murmurs Abd:      + bowel sounds; soft, non-tender; no palpable masses, no distension Ext:    No edema; adequate peripheral perfusion Skin:      Warm and dry; no rash Neuro: alert and oriented x 3 Psych: normal mood and affect  Data Reviewed: Imaging: High-resolution CT 11/16/2016-very mild fibrosis at the lung base, indeterminate for UIP High-resolution CT 12/27/2017-minimal right lower lobe bronchiectasis with slight increase in subpleural reticulation on the right, probable UIP pattern. I have reviewed the images personally.  PFTs: 02/17/2017 FVC 1.85 [59%], FEV1 1.53 [60%], F/F 82, TLC 3.93 (62%), DLCO 14.30 [51%] Moderate-severe restriction or diffusion defect  Labs: CTD serologies 02/17/2017, 07/24/18-negative  Assessment:  Interstitial lung disease I have reviewed his prior imaging which shows very minimal changes at the bases which appears nonspecific. 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 Follow-up in clinic after these tests for review and plan for next steps. If there is  progression then we may consider initiation of antifibrotic's He is not a candidate for biopsy due to presence of pulmonary hypertension.   Plan/Recommendations: High-res CT, PFTs  Marshell Garfinkel MD [facility] 12/31/2019, 10:44 AM  CC: Larey Dresser, MD

## 2020-01-04 ENCOUNTER — Telehealth: Payer: Self-pay | Admitting: Pulmonary Disease

## 2020-01-04 DIAGNOSIS — J849 Interstitial pulmonary disease, unspecified: Secondary | ICD-10-CM

## 2020-01-04 NOTE — Telephone Encounter (Signed)
Per 12/31/19 office visit, patient was to be scheduled for a HRCT next available.   This was not ordered.  Placed order for CT.   Routing back to [facility] for scheduling- please let us know if anything further is needed.  Thanks!

## 2020-01-07 ENCOUNTER — Encounter (HOSPITAL_COMMUNITY): Payer: Medicare HMO

## 2020-01-07 NOTE — Telephone Encounter (Signed)
Appt scheduled 01/16/2020 arrive at 1:15pm at [address] location I have left a vm for the wife

## 2020-01-07 NOTE — Telephone Encounter (Signed)
Ct has been authorized [employer]

## 2020-01-08 ENCOUNTER — Encounter (HOSPITAL_COMMUNITY): Payer: Medicare HMO

## 2020-01-16 ENCOUNTER — Telehealth (HOSPITAL_COMMUNITY): Payer: Self-pay

## 2020-01-16 ENCOUNTER — Other Ambulatory Visit: Payer: Self-pay

## 2020-01-16 ENCOUNTER — Ambulatory Visit (INDEPENDENT_AMBULATORY_CARE_PROVIDER_SITE_OTHER)
Admission: RE | Admit: 2020-01-16 | Discharge: 2020-01-16 | Disposition: A | Discharge status: Home / Self Care | Payer: Medicare HMO | Source: Ambulatory Visit | Attending: Pulmonary Disease | Admitting: Pulmonary Disease

## 2020-01-16 DIAGNOSIS — J849 Interstitial pulmonary disease, unspecified: Secondary | ICD-10-CM

## 2020-01-16 NOTE — Telephone Encounter (Signed)
[facility] stating that the patient is suppose to be having 2 teeth extractions and wanted to know if needed to stop the Eliquis. I asked that they send over clearance letter.   CB#[phone]

## 2020-01-21 ENCOUNTER — Ambulatory Visit (INDEPENDENT_AMBULATORY_CARE_PROVIDER_SITE_OTHER): Payer: Medicare HMO

## 2020-01-21 DIAGNOSIS — I639 Cerebral infarction, unspecified: Secondary | ICD-10-CM | POA: Diagnosis not present

## 2020-01-21 LAB — CUP PACEART REMOTE DEVICE CHECK
Date Time Interrogation Session: 20211219230706
Implantable Pulse Generator Implant Date: 20210325

## 2020-01-22 ENCOUNTER — Telehealth: Payer: Self-pay

## 2020-01-22 NOTE — Telephone Encounter (Signed)
Spoke with patient and scheduled an in-person Palliative Consult for 01/29/20 @ 2:30PM   COVID screening was negative. No pets in home. Patient lives in [name].   Consent obtained; updated Outlook/Netsmart/Team List and Epic.

## 2020-01-28 ENCOUNTER — Other Ambulatory Visit: Payer: Self-pay | Admitting: Pulmonary Disease

## 2020-01-28 DIAGNOSIS — J849 Interstitial pulmonary disease, unspecified: Secondary | ICD-10-CM

## 2020-01-29 ENCOUNTER — Other Ambulatory Visit: Payer: Self-pay

## 2020-01-29 ENCOUNTER — Other Ambulatory Visit: Payer: Medicare HMO | Admitting: Hospice

## 2020-01-29 ENCOUNTER — Ambulatory Visit (INDEPENDENT_AMBULATORY_CARE_PROVIDER_SITE_OTHER): Payer: Medicare HMO | Admitting: Pulmonary Disease

## 2020-01-29 DIAGNOSIS — Z515 Encounter for palliative care: Secondary | ICD-10-CM

## 2020-01-29 DIAGNOSIS — J849 Interstitial pulmonary disease, unspecified: Secondary | ICD-10-CM

## 2020-01-29 DIAGNOSIS — I5032 Chronic diastolic (congestive) heart failure: Secondary | ICD-10-CM

## 2020-01-29 LAB — PULMONARY FUNCTION TEST
DL/VA % pred: 70 %
DL/VA: 2.74 ml/min/mmHg/L
DLCO cor % pred: 57 %
DLCO cor: 11.62 ml/min/mmHg
DLCO unc % pred: 57 %
DLCO unc: 11.62 ml/min/mmHg
FEF 25-75 Post: 1.76 L/sec
FEF 25-75 Pre: 1.56 L/sec
FEF2575-%Change-Post: 12 %
FEF2575-%Pred-Post: 143 %
FEF2575-%Pred-Pre: 127 %
FEV1-%Change-Post: 3 %
FEV1-%Pred-Post: 96 %
FEV1-%Pred-Pre: 93 %
FEV1-Post: 1.93 L
FEV1-Pre: 1.87 L
FEV1FVC-%Change-Post: 1 %
FEV1FVC-%Pred-Pre: 109 %
FEV6-%Change-Post: 0 %
FEV6-%Pred-Post: 91 %
FEV6-%Pred-Pre: 90 %
FEV6-Post: 2.45 L
FEV6-Pre: 2.44 L
FEV6FVC-%Change-Post: -1 %
FEV6FVC-%Pred-Post: 108 %
FEV6FVC-%Pred-Pre: 109 %
FVC-%Change-Post: 1 %
FVC-%Pred-Post: 83 %
FVC-%Pred-Pre: 82 %
FVC-Post: 2.48 L
FVC-Pre: 2.44 L
Post FEV1/FVC ratio: 78 %
Post FEV6/FVC ratio: 99 %
Pre FEV1/FVC ratio: 77 %
Pre FEV6/FVC Ratio: 100 %
RV % pred: 92 %
RV: 2.33 L
TLC % pred: 77 %
TLC: 4.69 L

## 2020-01-29 NOTE — Progress Notes (Signed)
PATIENT NAME: Ryan [name] [address] [place] [id number] (home)  DOB: 1931-11-24 MRN: [id number]  PRIMARY CARE PROVIDER:    Roetta Sessions, NP,  [place] [address] [place] [address] [phone]  REFERRING PROVIDER:   Roetta Sessions, NP [address] [place],  [place] [address] [phone]  RESPONSIBLE PARTY:  CHEN  [phone]  Extended Emergency Contact Information Primary Emergency Contact: Vasallo,Jane Address: [place], [place] [address] [place] Phone: [phone] Mobile Phone: [phone] Relation: Spouse  I met face to face with patient and family in home/facility.  ADVANCE CARE PLANNING/RECOMMENDATIONS/PLAN:    Visit at the request of  Clide Deutscher NP for palliative consult. Visit consisted of building trust and discussions on Palliative Medicine as specialized medical care for people living with serious illness, aimed at facilitating better quality of life through symptoms relief, assisting with advance care plan and establishing complex decision making.  Ex-wife [name] was present during visit Patient discharged from [facility] care for improving in his health status.   Discussion on the difference between Palliative and Hospice care. Palliative care and hospice have similar goals of managing symptoms, promoting comfort, improving quality of life, and maintaining a person's dignity. However, palliative care may be offered during any phase of a serious illness, while hospice care is usually offered when a person is expected to live for 6 months or less.  Visit consisted of counseling and education dealing with the complex and emotionally intense issues of symptom management and palliative care in the setting of serious and potentially life-threatening illness. Palliative care team will continue to support patient,  patient's family, and medical team. Patient is a retired professor of [employer] Planning: Our advance care planning conversation included a discussion about:    The value and importance of advance care planning  Exploration of goals of care in the event of a sudden injury or illness  Identification and preparation of a healthcare agent          Review and updating or creation of an advance directive document  CODE STATUS: Ramifications and implications of CODE STATUS discussed.  Patient affirmed he is a DO NOT RESUSCITATE.   GOALS OF CARE: Goals of care include to maximize quality of life and symptom management.  Follow up Palliative Care Visit: Palliative care will continue to follow for goals of care clarification and symptom management.   Symptom Management: Patient is followed by pulmonologist for interstitial lung disease; followed by cardiologist for congestive heart failure. Continue Oxygen/CPAP at night; take medications as ordered Continue Linzess and Miralax as ordered for constipation.  Continue rotigotine trandermal patch along with Ropinirole for resless leg syngdrome.  Torsemide and Aldactone for edema.  Encouraged elevation and sticking to fluid restriction Patient continues to play poka, bingo and physical exercise.  Palliative will continue to monitor for symptom management/decline and make recommendations as needed.   Family /Caregiver/Community Supports: Patient lives independently at home.  He gets the services of [facility] at the independent living facility.  [name] is involved in his care.  Strong support system identified.  I spent 1 hour and 20 minutes providing this initial consultation; time includes time spent with patient/family, chart review, provider coordination,  and documentation. More than 50% of the time in this consultation was spent on counseling  patient and coordinating communication.   CHIEF COMPLAIN/HISTORY OF PRESENT  ILLNESS:  Ryan [name] is a 86 y.o. male with multiple medical problems including congestive heart failure, interstitial lung disease, restless leg syndrome.Marland Kitchen History obtained from review of EMR, discussion  with patient and [name].  Palliative Care was asked to follow this patient by consultation request of Merel, Santoli* to help address advance care planning and complex decision making. Thank you for the opportunity to participate in the care of [facility] [place]: DNR  PPS: 50%  HOSPICE ELIGIBILITY/DIAGNOSIS: TBD  PAST MEDICAL HISTORY:  Past Medical History:  Diagnosis Date  . Asthma   . Barrett's esophagus   . Benign prostatic hypertrophy   . Bilateral lower extremity edema   . CHF (congestive heart failure) ([facility])   . Chronic fatigue   . COPD (chronic obstructive pulmonary disease) ([facility])   . Diabetes mellitus type 2 in nonobese ([facility]) 04/24/2019  . Diverticulosis of colon (without mention of hemorrhage)   . Esophageal reflux   . Gastritis   . Gluten intolerance   . Hiatal hernia 02/10/11   Noted on CT Scan - Moderate Hiatal Hernia  . Hyperlipemia   . Hypertension   . Lymphoma of lymph nodes in pelvis ([facility]) 03/03/2011    Large Right Retroperitoneal Mass  . Melanoma ([facility])    lymphoma  . Microcytic anemia 04/20/2011   . 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 Osteoarthritis    hands/feet,knees, NECK, BACK  . Periaortic lymphadenopathy 02/16/2011  . Personal history of colonic polyps 2004   hyperplastic Dr. Collene Mares  . Pulmonary hyperinflation   . Renal cyst 02/10/11    Noted on CT Scan - Bilateral Renal Cysts  . S/P radiation therapy 03/15/11 - 04/09/11   Abdominal/ Pelvic Tumor, 3600 cGy/20 Fractions    SOCIAL HX:  Social History   Tobacco Use  . Smoking status: Former Smoker    Packs/day: 1.00    Years: 35.00    Pack years: 35.00    Types: Pipe, Cigarettes    Quit date: 02/23/1992    Years  since quitting: 27.9  . Smokeless tobacco: Never Used  . Tobacco comment: quit 20 years ago  Substance Use Topics  . Alcohol use: Not Currently    Comment: 2 drinks daily scotch  AND WINE WITH SUPPER   FAMILY HX:  Family History  Problem Relation Age of Onset  . Heart disease Father        MI 34  . Urticaria Father   . Prostate cancer Brother   . Prostate cancer Paternal Uncle   . Prostate cancer Paternal Uncle   . Prostate cancer Paternal Uncle   . Hyperlipidemia Other   . Stroke Other   . Hypertension Other   . ADD / ADHD Other   . Colon cancer Neg Hx   . Allergic rhinitis Neg Hx   . Asthma Neg Hx   . Eczema Neg Hx      Labs:   No results for input(s): WBC, HGB, HCT, PLT, MCV in the last 168 hours. No results for input(s): NA, K, CL, CO2, BUN, CREATININE, GLUCOSE in the last 168 hours.  ALLERGIES:  Allergies  Allergen Reactions  . Pollen Extract-Tree Extract Other (See Comments)    HEADACHES, TIRED , DRAINAGE FROM SINUSES  . Molds & Smuts Other (See Comments)    Also dust mites causes sinus infections, h/a  etc.     PERTINENT MEDICATIONS:  Outpatient Encounter Medications as of 01/29/2020  Medication Sig  . Acetaminophen (TYLENOL PO) Take by mouth.   Marland Kitchen allopurinol (ZYLOPRIM) 100 MG tablet Take 50 mg by mouth daily.  Marland Kitchen atorvastatin (LIPITOR) 40 MG tablet Take 1 tablet (40 mg total) by mouth daily at 6 PM.  . cyanocobalamin (,VITAMIN B-12,) 1000 MCG/ML injection One ml injection intramuscular once a week for 3 weeks and then monthly thereafter.  . diltiazem (CARDIZEM CD) 120 MG 24 hr capsule Take 1 capsule (120 mg total) by mouth daily.  Marland Kitchen ELIQUIS 2.5 MG TABS tablet TAKE ONE TABLET TWICE DAILY  . escitalopram (LEXAPRO) 10 MG tablet Take 10 mg by mouth daily.  . finasteride (PROSCAR) 5 MG tablet Take 5 mg by mouth daily.  Marland Kitchen FLONASE SENSIMIST 27.5 MCG/SPRAY nasal spray Place 1 spray into the nose daily as needed for rhinitis or allergies.   Marland Kitchen gabapentin (NEURONTIN)  100 MG capsule Take 3 capsules (300 mg total) by mouth at bedtime.  Marland Kitchen ipratropium (ATROVENT) 0.03 % nasal spray Place 2 sprays into both nostrils 2 (two) times daily.  Marland Kitchen latanoprost (XALATAN) 0.005 % ophthalmic solution Place 1 drop into both eyes at bedtime.   Marland Kitchen linaclotide (LINZESS) 290 MCG CAPS capsule Take 290 mcg by mouth daily before breakfast.  . methocarbamol (ROBAXIN) 500 MG tablet Take 500-1,000 mg by mouth every 8 (eight) hours as needed for muscle spasms.  . methylcellulose (CITRUCEL) oral powder Mix 2 grams in 8 ounces of water and drink twice a day.  Marland Kitchen omeprazole (PRILOSEC) 20 MG capsule Take 20 mg by mouth daily.  . potassium chloride (KLOR-CON) 10 MEQ tablet Take 1 tablet (10 mEq total) by mouth daily.  . RESTASIS 0.05 % ophthalmic emulsion 1 drop 2 (two) times daily.  . Riociguat (ADEMPAS) 2.5 MG TABS Take 2.5 mg by mouth 3 (three) times daily.  Marland Kitchen rOPINIRole (REQUIP) 2 MG tablet Can take up to 2 tablets a day for breakthrough symptoms for the next 1-2 weeks while starting neupro patch  . Rotigotine (NEUPRO) 1 MG/24HR PT24 Place 2 patches (2 mg total) onto the skin daily.  . Selexipag 1600 MCG TABS Take 1 tablet (1,600 mcg total) by mouth 2 (two) times daily.  Marland Kitchen spironolactone (ALDACTONE) 25 MG tablet Take 0.5 tablets (12.5 mg total) by mouth daily.  Marland Kitchen tiZANidine (ZANAFLEX) 2 MG tablet Take 2 mg by mouth 2 (two) times daily as needed for muscle spasms.  Marland Kitchen torsemide (DEMADEX) 20 MG tablet Take 5 tablets (100 mg total) by mouth 2 (two) times daily.  . traMADol (ULTRAM) 50 MG tablet Take 50 mg by mouth every 6 (six) hours as needed for moderate pain.   . traZODone (DESYREL) 100 MG tablet Take 100 mg by mouth at bedtime.   No facility-administered encounter medications on file as of 01/29/2020.    PHYSICAL EXAM/ROS: Height  5 feet 5 inches Weight 184 Ibs General: NAD, cooperative Cardiovascular: regular rate and rhythm; denies chest pain Pulmonary: clear ant /post  fields Abdomen: soft, nontender, + bowel sounds GU: no suprapubic tenderness Extremities: 2+ pitting edema in bilateral lower extremities Skin: no rashes to visible skin Neurological: Weakness but otherwise nonfocal  Note: Portions of this note were generated with Lobbyist. Dictation errors may occur despite best attempts at proofreading.  Teodoro Spray, NP

## 2020-01-29 NOTE — Progress Notes (Signed)
PFT done today. 

## 2020-02-04 NOTE — Progress Notes (Signed)
Carelink Summary Report / Loop Recorder

## 2020-02-07 ENCOUNTER — Other Ambulatory Visit: Payer: Self-pay

## 2020-02-07 ENCOUNTER — Ambulatory Visit (INDEPENDENT_AMBULATORY_CARE_PROVIDER_SITE_OTHER): Payer: Medicare HMO | Admitting: Pulmonary Disease

## 2020-02-07 ENCOUNTER — Encounter: Payer: Self-pay | Admitting: Pulmonary Disease

## 2020-02-07 VITALS — BP 120/74 | HR 83 | Ht 67.0 in | Wt 186.8 lb

## 2020-02-07 DIAGNOSIS — Z23 Encounter for immunization: Secondary | ICD-10-CM

## 2020-02-07 DIAGNOSIS — J849 Interstitial pulmonary disease, unspecified: Secondary | ICD-10-CM | POA: Diagnosis not present

## 2020-02-07 NOTE — Patient Instructions (Signed)
I have reviewed your CT scan which shows very minimal changes.  I am not entirely convinced that you have interstitial lung disease We will continue to monitor this Follow-up in 6 months

## 2020-02-07 NOTE — Progress Notes (Signed)
ZALEN SEQUEIRA    [id number]    28-Jan-1932  Primary Care Physician:Freeman, Rocco Pauls, NP  Referring Physician: Roetta Sessions, NP [address],  [place] [address]  Chief complaint: Consult for interstitial lung disease  HPI: 87 year old with history of pulmonary hypertension, interstitial lung disease, diastolic heart failure, non-Hodgkin's lymphoma s/p XRT, stage III chronic kidney disease Previously evaluated in 2019 at [facility] pulmonary clinic for mild nonspecific interstitial lung disease at the bases with negative CTD serologies. Also seen at [facility] pulmonary clinic for pulmonary hypertension and interstitial lung disease and was tried on tadalafil, Adcirca which he did not tolerate  Now follows with Dr. Aundra Dubin and is being treated with Malvin Johns and riociguat. Additional medical history includes paroxysmal atrial fibrillation for which she is on apixaban and OSA on CPAP which he is compliant with.  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 No mold, hot tub, Jacuzzi. No feather pillows or comforters Smoking history: 35-pack-year smoker. Quit in 1985 Travel history: No significant travel history Relevant family history: No significant family issue of lung disease  Interim history: Here for review of CT and PFTs States that his dyspnea is stable Complains of increasing lower extremity edema.  Outpatient Encounter Medications as of 02/07/2020  Medication Sig  . Acetaminophen (TYLENOL PO) Take by mouth.   Marland Kitchen allopurinol (ZYLOPRIM) 100 MG tablet Take 50 mg by mouth daily.  Marland Kitchen atorvastatin (LIPITOR) 40 MG tablet Take 1 tablet (40 mg total) by mouth daily at 6 PM.  . cyanocobalamin (,VITAMIN B-12,) 1000 MCG/ML injection One ml injection intramuscular once a week for 3 weeks and then monthly thereafter.  . diltiazem  (CARDIZEM CD) 120 MG 24 hr capsule Take 1 capsule (120 mg total) by mouth daily.  Marland Kitchen ELIQUIS 2.5 MG TABS tablet TAKE ONE TABLET TWICE DAILY  . escitalopram (LEXAPRO) 10 MG tablet Take 10 mg by mouth daily.  . finasteride (PROSCAR) 5 MG tablet Take 5 mg by mouth daily.  Marland Kitchen FLONASE SENSIMIST 27.5 MCG/SPRAY nasal spray Place 1 spray into the nose daily as needed for rhinitis or allergies.   Marland Kitchen gabapentin (NEURONTIN) 100 MG capsule Take 3 capsules (300 mg total) by mouth at bedtime.  Marland Kitchen ipratropium (ATROVENT) 0.03 % nasal spray Place 2 sprays into both nostrils 2 (two) times daily.  Marland Kitchen latanoprost (XALATAN) 0.005 % ophthalmic solution Place 1 drop into both eyes at bedtime.   Marland Kitchen linaclotide (LINZESS) 290 MCG CAPS capsule Take 290 mcg by mouth daily before breakfast.  . methocarbamol (ROBAXIN) 500 MG tablet Take 500-1,000 mg by mouth every 8 (eight) hours as needed for muscle spasms.  . methylcellulose (CITRUCEL) oral powder Mix 2 grams in 8 ounces of water and drink twice a day.  Marland Kitchen omeprazole (PRILOSEC) 20 MG capsule Take 20 mg by mouth daily.  . RESTASIS 0.05 % ophthalmic emulsion 1 drop 2 (two) times daily.  . Riociguat (ADEMPAS) 2.5 MG TABS Take 2.5 mg by mouth 3 (three) times daily.  Marland Kitchen rOPINIRole (REQUIP) 2 MG tablet Can take up to 2 tablets a day for breakthrough symptoms for the next 1-2 weeks while starting neupro patch  . Rotigotine (NEUPRO) 1 MG/24HR PT24 Place 2 patches (2 mg total) onto the skin daily.  . Selexipag 1600 MCG TABS Take 1 tablet (1,600 mcg total) by mouth 2 (two) times  daily.  . spironolactone (ALDACTONE) 25 MG tablet Take 0.5 tablets (12.5 mg total) by mouth daily.  Marland Kitchen tiZANidine (ZANAFLEX) 2 MG tablet Take 2 mg by mouth 2 (two) times daily as needed for muscle spasms.  Marland Kitchen torsemide (DEMADEX) 20 MG tablet Take 5 tablets (100 mg total) by mouth 2 (two) times daily.  . traMADol (ULTRAM) 50 MG tablet Take 50 mg by mouth every 6 (six) hours as needed for moderate pain.   . traZODone  (DESYREL) 100 MG tablet Take 100 mg by mouth at bedtime.  . [DISCONTINUED] potassium chloride (KLOR-CON) 10 MEQ tablet Take 1 tablet (10 mEq total) by mouth daily.   No facility-administered encounter medications on file as of 02/07/2020.   Physical Exam: Blood pressure 120/74, pulse 83, height _0  (1.702 m), weight 186 lb 12.8 oz (84.7 kg), SpO2 98 %. Gen:      No acute distress HEENT:  EOMI, sclera anicteric Neck:     No masses; no thyromegaly Lungs:    Clear to auscultation bilaterally; normal respiratory effort CV:         Regular rate and rhythm; no murmurs Abd:      + bowel sounds; soft, non-tender; no palpable masses, no distension Ext:    3+ edema; adequate peripheral perfusion Skin:      Warm and dry; no rash Neuro: alert and oriented x 3 Psych: normal mood and affect  Data Reviewed: Imaging: High-resolution CT 11/16/2016-very mild fibrosis at the lung base, indeterminate for UIP High-resolution CT 12/27/2017-minimal right lower lobe bronchiectasis with slight increase in subpleural reticulation on the right, probable UIP pattern. High-resolution CT 01/16/2020-stable mild reticulation at the base.  I have reviewed the images personally.  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  Findings could be related to hiatal hernia and acid reflux. PFTs reviewed with improvement in restriction.  He does have moderate diffusion  defect which could be related to pulmonary hypertension  Continue monitoring.  He will need follow-up CT in 1 year He will follow up with Dr. Aundra Dubin to see if Lasix needs to be increased for increasing lower extremity edema.  Plan/Recommendations: Clinic visit in 6 months  Marshell Garfinkel MD [facility] 02/07/2020, 11:19 AM  CC: Roetta Sessions*

## 2020-02-08 ENCOUNTER — Ambulatory Visit (HOSPITAL_COMMUNITY)
Admission: RE | Admit: 2020-02-08 | Discharge: 2020-02-08 | Disposition: A | Discharge status: Home / Self Care | Payer: Medicare HMO | Source: Ambulatory Visit | Attending: Cardiology | Admitting: Cardiology

## 2020-02-08 ENCOUNTER — Telehealth (HOSPITAL_COMMUNITY): Payer: Self-pay | Admitting: *Deleted

## 2020-02-08 ENCOUNTER — Encounter (HOSPITAL_COMMUNITY): Payer: Self-pay

## 2020-02-08 VITALS — BP 118/70 | HR 78 | Wt 186.6 lb

## 2020-02-08 DIAGNOSIS — N183 Chronic kidney disease, stage 3 unspecified: Secondary | ICD-10-CM | POA: Insufficient documentation

## 2020-02-08 DIAGNOSIS — G4733 Obstructive sleep apnea (adult) (pediatric): Secondary | ICD-10-CM | POA: Diagnosis not present

## 2020-02-08 DIAGNOSIS — Z87891 Personal history of nicotine dependence: Secondary | ICD-10-CM | POA: Insufficient documentation

## 2020-02-08 DIAGNOSIS — N1831 Chronic kidney disease, stage 3a: Secondary | ICD-10-CM | POA: Diagnosis not present

## 2020-02-08 DIAGNOSIS — Z8349 Family history of other endocrine, nutritional and metabolic diseases: Secondary | ICD-10-CM | POA: Diagnosis not present

## 2020-02-08 DIAGNOSIS — I480 Paroxysmal atrial fibrillation: Secondary | ICD-10-CM | POA: Diagnosis not present

## 2020-02-08 DIAGNOSIS — I272 Pulmonary hypertension, unspecified: Secondary | ICD-10-CM

## 2020-02-08 DIAGNOSIS — I130 Hypertensive heart and chronic kidney disease with heart failure and stage 1 through stage 4 chronic kidney disease, or unspecified chronic kidney disease: Secondary | ICD-10-CM | POA: Diagnosis not present

## 2020-02-08 DIAGNOSIS — Z8042 Family history of malignant neoplasm of prostate: Secondary | ICD-10-CM | POA: Diagnosis not present

## 2020-02-08 DIAGNOSIS — Z8572 Personal history of non-Hodgkin lymphomas: Secondary | ICD-10-CM | POA: Diagnosis not present

## 2020-02-08 DIAGNOSIS — Z823 Family history of stroke: Secondary | ICD-10-CM | POA: Insufficient documentation

## 2020-02-08 DIAGNOSIS — J8410 Pulmonary fibrosis, unspecified: Secondary | ICD-10-CM | POA: Diagnosis not present

## 2020-02-08 DIAGNOSIS — J849 Interstitial pulmonary disease, unspecified: Secondary | ICD-10-CM

## 2020-02-08 DIAGNOSIS — R0600 Dyspnea, unspecified: Secondary | ICD-10-CM | POA: Diagnosis not present

## 2020-02-08 DIAGNOSIS — I5032 Chronic diastolic (congestive) heart failure: Secondary | ICD-10-CM | POA: Diagnosis not present

## 2020-02-08 DIAGNOSIS — Z79899 Other long term (current) drug therapy: Secondary | ICD-10-CM | POA: Diagnosis not present

## 2020-02-08 DIAGNOSIS — Z7901 Long term (current) use of anticoagulants: Secondary | ICD-10-CM | POA: Diagnosis not present

## 2020-02-08 DIAGNOSIS — R0609 Other forms of dyspnea: Secondary | ICD-10-CM

## 2020-02-08 DIAGNOSIS — Z818 Family history of other mental and behavioral disorders: Secondary | ICD-10-CM | POA: Insufficient documentation

## 2020-02-08 DIAGNOSIS — Z8719 Personal history of other diseases of the digestive system: Secondary | ICD-10-CM | POA: Insufficient documentation

## 2020-02-08 DIAGNOSIS — Z8673 Personal history of transient ischemic attack (TIA), and cerebral infarction without residual deficits: Secondary | ICD-10-CM

## 2020-02-08 DIAGNOSIS — I2721 Secondary pulmonary arterial hypertension: Secondary | ICD-10-CM | POA: Diagnosis not present

## 2020-02-08 DIAGNOSIS — Z8249 Family history of ischemic heart disease and other diseases of the circulatory system: Secondary | ICD-10-CM | POA: Insufficient documentation

## 2020-02-08 MED ORDER — METOLAZONE 2.5 MG PO TABS: 2.5000 mg | ORAL_TABLET | Freq: Every day | ORAL | 0 refills | Status: DC

## 2020-02-08 NOTE — Telephone Encounter (Signed)
Pt's wife left VM late yesterday about pt having LE edema and needing a sooner appt.  Called pt's wife this am, she states the edema is the worst she has ever seen it and is worried about pt.  Appt sch for 11 am today

## 2020-02-08 NOTE — Progress Notes (Addendum)
PCP: Roetta Sessions, NP Cardiology: Dr. Aundra Dubin  87 y.o. with history of interstitial lung disease (?NSIP vs UIP) on home oxygen, pulmonary hypertension, diastolic CHF, and prior non-Hodgkins lymphoma (pelvic mass, s/p XRT) presents for followup of CHF and pulmonary hypertension.  Patient has history of reportedly mild ILD (NSIP vs UIP) followed by a pulmonologist at [facility] and pulmonary hypertension thought to be out of proportion lung disease followed by a physician at [facility].  Last CT chest was in 11/19, showing stable ILD (?UIP vs NSIP).  He had RHC in 2/20 with moderate PAH, PVR 5.1 WU.  He was tried on Adcirca but developed cognitive problems and worsening of peripheral edema so this was stopped.  He has been on home oxygen, uses at night and with exertion.   He was admitted in 6/20 with worsening dyspnea and peripheral edema, weight up despite compliance with torsemide 40 mg bid at home.  He has CKD stage 3 and torsemide was decreased in the spring. He was diuresed aggressively with fall in weight.  Echo showed normal LV size and systolic function, RV reportedly normal.  6/20 RHC showed normal filling pressures with moderate pulmonary hypertension, PVR 5.35 WU.   After discharge from hospital, patient started on ambrisentan.  His weight steadily began to increase and he developed worsening lower extremity edema, we stopped ambrisentan.   Patient was admitted in 8/20 with AKI after taking metolazone for several days.  He was admitted again in 8/20 with acute on chronic diastolic CHF. He was diuresed.  Wandering atrial pacemaker noted.   He was admitted in 3/21 with suspected CVA, MRI head showed punctate occipital lobe infarct, thought to be due to small vessel disease. ILR was implanted.  Echo in 3/21 showed EF 60-65%, severe LVH, normal RV.  PYP scan in 3/21 was not suggestive of TTR amyloidosis.  Subsequently, ILR has shown paroxysmal atrial fibrillation.   He was admitted  later in 3/21 with AKI, creatinine up to 5.  However, he corrected rapidly to baseline with IVF and creatinine was 1.02 when discharge.   OV on 11/21 He returns for followup of CHF and pulmonary hypertension. He is using CPAP.  No problems walking for short distances on flat ground.  Uses walker for balance. Short of breath with longer distances.  Weight is up 8 lbs.  No chest pain.  No lightheadedness.  Increased ankle swelling, cannot wear compression stockings due to restless leg syndrome. Of note, he has been taking Naproxen 3 times a day. Torsemide was increased to 100 mg bid  Here today with increased LE swelling started worsening 2 months ago. SOB with walking longer distances at baseline. Unable to quantify how much fluid he is drinking. Diet has been fairly steady though out the holidays. No CP, dizziness, chronically sleeps in recliner. Wears CPAP every night. Urinates very little during the day, mostly has brisk urinary response during the night. Weights at home ~183.5 lbs. Tried compression hose in past but unable to tolerate due to RLS. Says he is more bothered by his restless legs than by increased fluid in his legs.  6 minute walk (8/21): 122 m 6 minute walk (11/21): 244 m  Labs (6/20): K 4.7, creatinine 1.46 Labs (7/20): K 3.1, creatinine 1.67 Labs (8/20): K 3.9, creatinine 1.8 Labs (9/20): K 4.9, creatinine 1.76, BNP 78 Labs (10/20): K 4.2, creatininine 1.5 Labs (11/20): BNP 36, K 4.3, creatinine 1.4 Labs (3/21): myeloma panel negative, urine immunofixation negative.  Labs (  4/21): K 4.5, creatinine 1.02, hgb 11.4 Labs (6/21): K 5.1, creatinine 2.2, hgb 10.4 Labs (8/21): LDL 82 Labs (11/21): K 5.3, creatinine 2.11  PMH: 1. Non-Hodgkin's lymphoma: Pelvic involvement.  S/p radiation.  Thought to be in remission (Dr. Benay Spice).  2. HTN 3. PACs/PVCs 4. GERD: Barrett's esophagus.  5. 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  - High resolution CT chest 11/19: ILD, ?UIP pattern.  - High  resolution CT chest 12/21: ILD, still ? UIP pattern. 6. Pulmonary hypertension: Thought to be out of proportion to underlying lung disease (ILD, ?NSIP vs UIP).   - RHC (2/20): mean RA 10, PASP 60 with mean 42, mean PCWP 11, CI 3, PVR 5.1 WU.  - Trial of Adcirca => failed due to ?memory worsening/cognitive affects and peripheral edema.  - Echo (6/20): EF 60-65%, mild LVH, RV reportedly normal, PASP 59 mmHg.  - RHC (6/20): mean RA 7, PA 50/24 mean 34, mean PCWP 12, CI 2.01, PAPI 3.7, PVR 5.35 WU - RF negative, ANA negative, anti-SCL70 negative, anti-centromere negative.  - V/Q scan (8/20): No evidence for chronic PE.  - Echo (3/21): EF 60-65%, severe LVH, normal RV.  7. CKD: Stage 3.   8. Wandering atrial pacemaker 9. OSA 10. PYP scan (3/21): H/CL ratio 1.0, grade 0.  11. CVA: MRI 3/21 with punctate occipital lobe infarct thought to be due to small vessel disease. - Carotid dopplers (3/21): 40-59% RICA stenosis.  12. Atrial fibrillation: Paroxysmal, noted on ILR.   Social History   Socioeconomic History  . Marital status: Married    Spouse name: Not on file  . Number of children: 2  . Years of education: Not on file  . Highest education level: Not on file  Occupational History  . 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 Smoking status: Former Smoker    Packs/day: 1.00    Years: 35.00    Pack years: 35.00    Types: Pipe, Cigarettes    Quit date: 02/23/1992    Years since quitting: 27.9  . Smokeless tobacco: Never Used  . Tobacco comment: quit 20 years ago  Vaping Use  . Vaping Use: Never used  Substance and Sexual Activity  . Alcohol use: Not Currently    Comment: 2 drinks daily scotch  AND WINE WITH SUPPER  . Drug use: No  . Sexual activity: Not on file  Other Topics Concern  . Not on file  Social History Narrative   Married - second marriage   He has two children   Retired Horticulturist, commercial Professor   Currently teaches pottery  making, 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[facility]   Former Smoker quit 12 years ago- smoked for 35 years   Alcohol use- not currently   Right handed   Social Determinants of Radio broadcast assistant Strain: Not on file  Food Insecurity: Not on file  Transportation Needs: Not on file  Physical Activity: Not on file  Stress: Not on file  Social Connections: Not on file  Intimate Partner Violence: Not on file   Family History  Problem Relation Age of Onset  . Heart disease Father        MI 66  . Urticaria Father   . Prostate cancer Brother   . Prostate cancer Paternal Uncle   . Prostate cancer Paternal Uncle   . Prostate cancer Paternal Uncle   . Hyperlipidemia Other   . Stroke Other   . Hypertension  Other   . ADD / ADHD Other   . Colon cancer Neg Hx   . Allergic rhinitis Neg Hx   . Asthma Neg Hx   . Eczema Neg Hx    ROS: All systems reviewed and negative except as per HPI.   Current Outpatient Medications  Medication Sig Dispense Refill  . Acetaminophen (TYLENOL PO) Take by mouth.     Marland Kitchen allopurinol (ZYLOPRIM) 100 MG tablet Take 50 mg by mouth daily.    Marland Kitchen atorvastatin (LIPITOR) 40 MG tablet Take 1 tablet (40 mg total) by mouth daily at 6 PM. 30 tablet 1  . cyanocobalamin (,VITAMIN B-12,) 1000 MCG/ML injection One ml injection intramuscular once a week for 3 weeks and then monthly thereafter. 1 mL 14  . diltiazem (CARDIZEM CD) 120 MG 24 hr capsule Take 1 capsule (120 mg total) by mouth daily. 90 capsule 1  . ELIQUIS 2.5 MG TABS tablet TAKE ONE TABLET TWICE DAILY 60 tablet 5  . escitalopram (LEXAPRO) 10 MG tablet Take 10 mg by mouth daily.    . finasteride (PROSCAR) 5 MG tablet Take 5 mg by mouth daily.    Marland Kitchen FLONASE SENSIMIST 27.5 MCG/SPRAY nasal spray Place 1 spray into the nose daily as needed for rhinitis or allergies.     Marland Kitchen gabapentin (NEURONTIN) 100 MG capsule Take 3 capsules (300 mg total) by mouth at bedtime. 90 capsule 1  . ipratropium (ATROVENT) 0.03 % nasal spray Place 2  sprays into both nostrils 2 (two) times daily.    Marland Kitchen latanoprost (XALATAN) 0.005 % ophthalmic solution Place 1 drop into both eyes at bedtime.     Marland Kitchen linaclotide (LINZESS) 290 MCG CAPS capsule Take 290 mcg by mouth daily before breakfast.    . methocarbamol (ROBAXIN) 500 MG tablet Take 500-1,000 mg by mouth every 8 (eight) hours as needed for muscle spasms.    . methylcellulose (CITRUCEL) oral powder Mix 2 grams in 8 ounces of water and drink twice a day. 454 g 0  . metolazone (ZAROXOLYN) 2.5 MG tablet Take 1 tablet (2.5 mg total) by mouth daily for 1 day. 1 tablet 0  . omeprazole (PRILOSEC) 20 MG capsule Take 20 mg by mouth daily.    . RESTASIS 0.05 % ophthalmic emulsion 1 drop 2 (two) times daily.    . Riociguat (ADEMPAS) 2.5 MG TABS Take 2.5 mg by mouth 3 (three) times daily.    Marland Kitchen rOPINIRole (REQUIP) 2 MG tablet Can take up to 2 tablets a day for breakthrough symptoms for the next 1-2 weeks while starting neupro patch 15 tablet 0  . Rotigotine (NEUPRO) 1 MG/24HR PT24 Place 2 patches (2 mg total) onto the skin daily. 30 patch 5  . Selexipag 1600 MCG TABS Take 1 tablet (1,600 mcg total) by mouth 2 (two) times daily. 60 tablet 6  . spironolactone (ALDACTONE) 25 MG tablet Take 0.5 tablets (12.5 mg total) by mouth daily. 15 tablet 3  . tiZANidine (ZANAFLEX) 2 MG tablet Take 2 mg by mouth 2 (two) times daily as needed for muscle spasms.    Marland Kitchen torsemide (DEMADEX) 20 MG tablet Take 5 tablets (100 mg total) by mouth 2 (two) times daily. 300 tablet 3  . traMADol (ULTRAM) 50 MG tablet Take 50 mg by mouth every 6 (six) hours as needed for moderate pain.     . traZODone (DESYREL) 100 MG tablet Take 100 mg by mouth at bedtime.     No current facility-administered medications for this encounter.  Wt Readings from Last 3 Encounters:  02/08/20 84.6 kg (186 lb 9.6 oz)  02/07/20 84.7 kg (186 lb 12.8 oz)  12/31/19 84.3 kg (185 lb 12.8 oz)    BP 118/70   Pulse 78   Wt 84.6 kg (186 lb 9.6 oz)   SpO2 97%    BMI 29.23 kg/m    ReDs Clip: 31% Physical Exam: General:  NAD. No resp difficulty HEENT: Normal Neck: Supple. No JVD. Carotids 2+ bilat; no bruits. No lymphadenopathy or thryomegaly appreciated. Cor: PMI nondisplaced. Regular rate & rhythm. No rubs, gallops or murmurs. Lungs: Clear Abdomen: Soft, nontender, nondistended. No hepatosplenomegaly. No bruits or masses. Good bowel sounds. Extremities: No cyanosis, clubbing, rash, 3+ pitting bilateral lower extremity edema, skin integrity compromised on right anterior shin, with scant serous fluid leaking. Neuro: alert & oriented x 3, cranial nerves grossly intact. Moves all 4 extremities w/o difficulty. Affect pleasant.  Assessment/Plan: 1. Chronic diastolic CHF: Suspect with significant component of RV failure from pulmonary hypertension. Echo in 6/20 with EF 60-65%, RV not well-visualized but PASP 59 mmHg.  Echo (3/21) with EF 60-65%, severe LVH, normal RV, PASP 28 mmHg.  PYP scan was not suggestive of TTR cardiac amyloidosis.  NYHA class III symptoms.   - Lengthy discussion about needing to compress LE fluid back into vasculature so that diuretics will be more effective. He is worried about aggravating his RLS, which is quite debilitating. He is agreeable to trying [employer]. Will arrange with HH. - Take metolazone 2.5 mg x 1 (stressed taking this AFTER his legs have been wrapped). - Continue torsemide to 100 mg bid.  - Continue spironolactone 25 mg daily. - BMET today. 2. CKD Stage 3: He has been off Naproxen. - He uses Tylenol or tramadol for pain.  - BMET today.  3. Pulmonary hypertension: Patient thought to have [place] out of proportion to his lung disease (ILD, NSIP versus UIP). RHC in 2/20 showed mean PA pressure 41, PVR 5.1 WU, CI 3. Echo in 6/20 with RV reportedly ok =>I reviewed and do not think that RV was well-visualized. Patient did not tolerate Adcirca due to edema/?cognitive effects.  RHC was done again in 6/20, showed moderate  pulmonary arterial hypertension with PVR 5.35 (comparable to prior).  Serological workup for PH was negative. Patient started ambrisentan 5 mg and developed significant edema/volume overload despite a high dose of torsemide. He is now on Uptravi + riociguat.  Most recent echo in 3/21 showed normal RV size/systolic function and PA systolic pressure estimate was normal.  6 minute walk is significantly improved at last OV, though he wis volume overloaded.  - he will stay off ambrisentan (? trigger for volume overload).  - As above, he did not tolerate tadalafil.  - Continue Uptravi, now up to 1600 mcg bid. Can consider increase in future but would like to get volume under control first.  - Continue riociguat at goal dose 2.5 mg tid.  4. Atrial fibrillation: Paroxysmal, noted on ILR. NSR today.  - Continue apixaban 2.5 mg bid (dosed for age, renal dysfunction). No bleeding issues 5. OSA: Continue CPAP.  6. CVA: In 3/21, may have been atrial fibrillation-related.  On apixaban now.  7. Carotid stenosis: 40-59% RICA stenosis on 3/21 dopplers.  - Repeat carotid dopplers in 3/22.  8. Interstitial lung disease: Followed by Dr. Vaughan Browner  - Hi-Res CT (12/21) early mild ILD - PFTs (12/21) improved from 2019; FVC of 82%, ratio 77, TLC of 77% with DLCO of  57% that corrects for alveolar volume - If progression of lung disease may consider initiation of antifibrotics; not a candidate for biopsy due to presence of pulmonary hypertension.   Followup in 2 weeks in APP clinic.  Valentine 02/08/2020

## 2020-02-08 NOTE — Patient Instructions (Addendum)
You have been referred to [place] for unna boot placement -they will be in contact to arrange a home visit  Once legs are wrapped, take ONE DOSE of Metolazone 2.5 mg  Labs today We will only contact you if something comes back abnormal or we need to make some changes. Otherwise no news is good news!  Keep follow up as scheduled   If you have any questions or concerns before your next appointment please send [place] a message through [place] or call our office at [phone].    TO LEAVE A MESSAGE FOR THE NURSE SELECT OPTION 2, PLEASE LEAVE A MESSAGE INCLUDING: . YOUR NAME . DATE OF BIRTH . CALL BACK NUMBER . REASON FOR CALL**this is important as we prioritize the call backs  YOU WILL RECEIVE A CALL BACK THE SAME DAY AS LONG AS YOU CALL BEFORE 4:00 PM Do the following things EVERYDAY: 1) Weigh yourself in the morning before breakfast. Write it down and keep it in a log. 2) Take your medicines as prescribed 3) Eat low salt foods-Limit salt (sodium) to 2000 mg per day.  4) Stay as active as you can everyday 5) Limit all fluids for the day to less than 2 liters

## 2020-02-08 NOTE — Progress Notes (Signed)
ReDS Vest / Clip - 02/08/20 1100      ReDS Vest / Clip   Station Marker D    Ruler Value 33    ReDS Value Range Low volume    ReDS Actual Value 31    Anatomical Comments sitting

## 2020-02-11 ENCOUNTER — Telehealth (HOSPITAL_COMMUNITY): Payer: Self-pay | Admitting: Cardiology

## 2020-02-11 NOTE — Telephone Encounter (Signed)
[facility] unable to accept referral at this time due to staff shortage. referral sent to [facility] to assist with referral

## 2020-02-12 NOTE — Telephone Encounter (Signed)
Per [place] with [facility] unable to accept pt at this time as pt does not meet unna boot criteria Will forward to providers for further instructions as this changes plan.

## 2020-02-12 NOTE — Telephone Encounter (Signed)
Please ask him to try compression socks .  Malcome Ambrocio NP-C  10:45 AM

## 2020-02-21 ENCOUNTER — Encounter (HOSPITAL_COMMUNITY): Payer: Medicare HMO

## 2020-02-21 ENCOUNTER — Telehealth (HOSPITAL_COMMUNITY): Payer: Self-pay | Admitting: Pharmacist

## 2020-02-21 NOTE — Telephone Encounter (Signed)
Attempted to submit PA for Adempas. Per Cover My Meds,  patient currently has access to the requested medication and a Prior Authorization is not needed.   Audry Riles, PharmD, BCPS, BCCP, CPP Heart Failure Clinic Pharmacist [phone]

## 2020-02-23 LAB — CUP PACEART REMOTE DEVICE CHECK
Date Time Interrogation Session: 20220121230555
Implantable Pulse Generator Implant Date: 20210325

## 2020-02-25 ENCOUNTER — Ambulatory Visit (INDEPENDENT_AMBULATORY_CARE_PROVIDER_SITE_OTHER): Payer: Medicare HMO

## 2020-02-25 DIAGNOSIS — I639 Cerebral infarction, unspecified: Secondary | ICD-10-CM | POA: Diagnosis not present

## 2020-02-25 NOTE — Progress Notes (Signed)
PCP: Roetta Sessions, NP Cardiology: Dr. Aundra Dubin  87 y.o. with history of interstitial lung disease (?NSIP vs UIP) on home oxygen, pulmonary hypertension, diastolic CHF, and prior non-Hodgkins lymphoma (pelvic mass, s/p XRT) presents for followup of CHF and pulmonary hypertension.  Patient has history of reportedly mild ILD (NSIP vs UIP) followed by a pulmonologist at [facility] and pulmonary hypertension thought to be out of proportion lung disease followed by a physician at [facility].  Last CT chest was in 11/19, showing stable ILD (?UIP vs NSIP).  He had RHC in 2/20 with moderate PAH, PVR 5.1 WU.  He was tried on Adcirca but developed cognitive problems and worsening of peripheral edema so this was stopped.  He has been on home oxygen, uses at night and with exertion.   He was admitted in 6/20 with worsening dyspnea and peripheral edema, weight up despite compliance with torsemide 40 mg bid at home.  He has CKD stage 3 and torsemide was decreased in the spring. He was diuresed aggressively with fall in weight.  Echo showed normal LV size and systolic function, RV reportedly normal.  6/20 RHC showed normal filling pressures with moderate pulmonary hypertension, PVR 5.35 WU.   After discharge from hospital, patient started on ambrisentan.  His weight steadily began to increase and he developed worsening lower extremity edema, we stopped ambrisentan.   Patient was admitted in 8/20 with AKI after taking metolazone for several days.  He was admitted again in 8/20 with acute on chronic diastolic CHF. He was diuresed.  Wandering atrial pacemaker noted.   He was admitted in 3/21 with suspected CVA, MRI head showed punctate occipital lobe infarct, thought to be due to small vessel disease. ILR was implanted.  Echo in 3/21 showed EF 60-65%, severe LVH, normal RV.  PYP scan in 3/21 was not suggestive of TTR amyloidosis.  Subsequently, ILR has shown paroxysmal atrial fibrillation.   He was admitted  later in 3/21 with AKI, creatinine up to 5.  However, he corrected rapidly to baseline with IVF and creatinine was 1.02 when discharge.   OV on 11/21 He returns for followup of CHF and pulmonary hypertension. He is using CPAP.  No problems walking for short distances on flat ground.  Uses walker for balance. Short of breath with longer distances.  Weight is up 8 lbs.  No chest pain.  No lightheadedness.  Increased ankle swelling, cannot wear compression stockings due to restless leg syndrome. Of note, he has been taking Naproxen 3 times a day. Torsemide was increased to 100 mg bid  Wears CPAP every night. Weights at home ~183.5 lbs. Tried compression hose in past but unable to tolerate due to RLS. Says he is more bothered by his restless legs than by increased fluid in his legs. Unna boots were ordered and instructed to take metolazone after wraps applied.  Today he returns for HF follow up with his wife. Home Health was unable to wrap his legs but did encourage him to elevate legs at night. This brought on a bad restless leg episode. He took 1 metolazone tablet on Sunday by accident, thought he was taking a pill for his RLS. Overall feeling fine, his weight weight down and swelling decreased in legs after metolazone dose. Cannot tell much difference with breathing, but feels it is at baseline, no worse. Denies increasing SOB, CP, dizziness. Chronically sleeps in recliner. Appetite ok. No fever or chills. Weight at home ~181 pounds. Taking all medications. He lives at  [place].  ECG today (personally reviewed): SR RBBB, qrs 112 ms, 79 bpm  6 minute walk (8/21): 122 m 6 minute walk (11/21): 244 m  Labs (6/20): K 4.7, creatinine 1.46 Labs (7/20): K 3.1, creatinine 1.67 Labs (8/20): K 3.9, creatinine 1.8 Labs (9/20): K 4.9, creatinine 1.76, BNP 78 Labs (10/20): K 4.2, creatininine 1.5 Labs (11/20): BNP 36, K 4.3, creatinine 1.4 Labs (3/21): myeloma panel negative, urine  immunofixation negative.  Labs (4/21): K 4.5, creatinine 1.02, hgb 11.4 Labs (6/21): K 5.1, creatinine 2.2, hgb 10.4 Labs (8/21): LDL 82 Labs (11/21): K 5.3, creatinine 2.11  PMH: 1. Non-Hodgkin's lymphoma: Pelvic involvement.  S/p radiation.  Thought to be in remission (Dr. Benay Spice).  2. HTN 3. PACs/PVCs 4. GERD: Barrett's esophagus.  5. 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  - High resolution CT chest 11/19: ILD, ?UIP pattern.  - High resolution CT chest 12/21: ILD, still ? UIP pattern. 6. Pulmonary hypertension: Thought to be out of proportion to underlying lung disease (ILD, ?NSIP vs UIP).   - RHC (2/20): mean RA 10, PASP 60 with mean 42, mean PCWP 11, CI 3, PVR 5.1 WU.  - Trial of Adcirca => failed due to ?memory worsening/cognitive affects and peripheral edema.  - Echo (6/20): EF 60-65%, mild LVH, RV reportedly normal, PASP 59 mmHg.  - RHC (6/20): mean RA 7, PA 50/24 mean 34, mean PCWP 12, CI 2.01, PAPI 3.7, PVR 5.35 WU - RF negative, ANA negative, anti-SCL70 negative, anti-centromere negative.  - V/Q scan (8/20): No evidence for chronic PE.  - Echo (3/21): EF 60-65%, severe LVH, normal RV.  7. CKD: Stage 3.   8. Wandering atrial pacemaker 9. OSA 10. PYP scan (3/21): H/CL ratio 1.0, grade 0.  11. CVA: MRI 3/21 with punctate occipital lobe infarct thought to be due to small vessel disease. - Carotid dopplers (3/21): 40-59% RICA stenosis.  12. Atrial fibrillation: Paroxysmal, noted on ILR.   Social History   Socioeconomic History  . Marital status: Married    Spouse name: Not on file  . Number of children: 2  . Years of education: Not on file  . Highest education level: Not on file  Occupational History  . 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 Smoking status: Former Smoker    Packs/day: 1.00    Years: 35.00    Pack years: 35.00    Types: Pipe, Cigarettes    Quit date: 02/23/1992    Years since quitting: 28.0  . Smokeless tobacco:  Never Used  . Tobacco comment: quit 20 years ago  Vaping Use  . Vaping Use: Never used  Substance and Sexual Activity  . Alcohol use: Not Currently    Comment: 2 drinks daily scotch  AND WINE WITH SUPPER  . Drug use: No  . Sexual activity: Not on file  Other Topics Concern  . Not on file  Social History Narrative   Married - second marriage   He has two children   Retired Horticulturist, commercial Professor   Currently teaches pottery making, 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[facility]   Former Smoker quit 12 years ago- smoked for 35 years   Alcohol use- not currently   Right handed   Social Determinants of Radio broadcast assistant Strain: Not on file  Food Insecurity: Not on file  Transportation Needs: Not on file  Physical Activity: Not on file  Stress: Not on file  Social Connections:  Not on file  Intimate Partner Violence: Not on file   Family History  Problem Relation Age of Onset  . Heart disease Father        MI 61  . Urticaria Father   . Prostate cancer Brother   . Prostate cancer Paternal Uncle   . Prostate cancer Paternal Uncle   . Prostate cancer Paternal Uncle   . Hyperlipidemia Other   . Stroke Other   . Hypertension Other   . ADD / ADHD Other   . Colon cancer Neg Hx   . Allergic rhinitis Neg Hx   . Asthma Neg Hx   . Eczema Neg Hx    ROS: All systems reviewed and negative except as per HPI.   Current Outpatient Medications  Medication Sig Dispense Refill  . Acetaminophen (TYLENOL PO) Take by mouth.     Marland Kitchen allopurinol (ZYLOPRIM) 100 MG tablet Take 50 mg by mouth daily.    Marland Kitchen atorvastatin (LIPITOR) 40 MG tablet Take 1 tablet (40 mg total) by mouth daily at 6 PM. 30 tablet 1  . cyanocobalamin (,VITAMIN B-12,) 1000 MCG/ML injection One ml injection intramuscular once a week for 3 weeks and then monthly thereafter. 1 mL 14  . diltiazem (CARDIZEM CD) 120 MG 24 hr capsule Take 1 capsule (120 mg total) by mouth daily. 90 capsule 1  . ELIQUIS 2.5 MG TABS tablet TAKE ONE TABLET  TWICE DAILY 60 tablet 5  . escitalopram (LEXAPRO) 10 MG tablet Take 10 mg by mouth daily.    . finasteride (PROSCAR) 5 MG tablet Take 5 mg by mouth daily.    Marland Kitchen FLONASE SENSIMIST 27.5 MCG/SPRAY nasal spray Place 1 spray into the nose daily as needed for rhinitis or allergies.     Marland Kitchen gabapentin (NEURONTIN) 100 MG capsule Take 3 capsules (300 mg total) by mouth at bedtime. 90 capsule 1  . ipratropium (ATROVENT) 0.03 % nasal spray Place 2 sprays into both nostrils 2 (two) times daily.    Marland Kitchen latanoprost (XALATAN) 0.005 % ophthalmic solution Place 1 drop into both eyes at bedtime.     Marland Kitchen linaclotide (LINZESS) 290 MCG CAPS capsule Take 290 mcg by mouth daily before breakfast.    . methocarbamol (ROBAXIN) 500 MG tablet Take 500-1,000 mg by mouth every 8 (eight) hours as needed for muscle spasms.    . methylcellulose (CITRUCEL) oral powder Mix 2 grams in 8 ounces of water and drink twice a day. 454 g 0  . metolazone (ZAROXOLYN) 2.5 MG tablet Take 2.5 mg by mouth daily.    Marland Kitchen omeprazole (PRILOSEC) 20 MG capsule Take 20 mg by mouth daily.    . RESTASIS 0.05 % ophthalmic emulsion 1 drop 2 (two) times daily.    . Riociguat (ADEMPAS) 2.5 MG TABS Take 2.5 mg by mouth 3 (three) times daily.    Marland Kitchen rOPINIRole (REQUIP) 2 MG tablet Can take up to 2 tablets a day for breakthrough symptoms for the next 1-2 weeks while starting neupro patch 15 tablet 0  . Rotigotine (NEUPRO) 1 MG/24HR PT24 Place 2 patches (2 mg total) onto the skin daily. 30 patch 5  . Selexipag 1600 MCG TABS Take 1 tablet (1,600 mcg total) by mouth 2 (two) times daily. 60 tablet 6  . spironolactone (ALDACTONE) 25 MG tablet Take 0.5 tablets (12.5 mg total) by mouth daily. 15 tablet 3  . tiZANidine (ZANAFLEX) 2 MG tablet Take 2 mg by mouth 2 (two) times daily as needed for muscle spasms.    Marland Kitchen  torsemide (DEMADEX) 20 MG tablet Take 5 tablets (100 mg total) by mouth 2 (two) times daily. 300 tablet 3  . traMADol (ULTRAM) 50 MG tablet Take 50 mg by mouth every 6  (six) hours as needed for moderate pain.     . traZODone (DESYREL) 100 MG tablet Take 100 mg by mouth at bedtime.     No current facility-administered medications for this encounter.   Wt Readings from Last 3 Encounters:  02/27/20 84.1 kg (185 lb 6.4 oz)  02/08/20 84.6 kg (186 lb 9.6 oz)  02/07/20 84.7 kg (186 lb 12.8 oz)    BP 115/73   Pulse 83   Wt 84.1 kg (185 lb 6.4 oz)   SpO2 93%   BMI 29.04 kg/m    ReDs Clip: 38%, up from 31% last visit. Physical Exam: General:  NAD. No resp difficulty. HEENT: HOH, bilateral hearing aids Neck: Supple.JVP 7-8. Carotids 2+ bilat; no bruits. No lymphadenopathy or thryomegaly appreciated. Cor: PMI nondisplaced. Regular rate & rhythm. No rubs, gallops or murmurs. Lungs: Clear, diminished in bases Abdomen: Obese, soft, nontender, nondistended. No hepatosplenomegaly. No bruits or masses. Good bowel sounds. Extremities: No cyanosis, clubbing, rash, 3-4+ lower extremity edema to shins Neuro: alert & oriented x 3, cranial nerves grossly intact. Moves all 4 extremities w/o difficulty. Affect pleasant.  Assessment/Plan: 1. Chronic diastolic CHF: Suspect with significant component of RV failure from pulmonary hypertension. Echo in 6/20 with EF 60-65%, RV not well-visualized but PASP 59 mmHg.  Echo (3/21) with EF 60-65%, severe LVH, normal RV, PASP 28 mmHg.  PYP scan was not suggestive of TTR cardiac amyloidosis.  NYHA class III symptoms.  Volume up on exam and by ReDs, although his legs look some better and weight is stable. - Was unable to have legs wrapped with HH & unable to try compression hose d/t RLS. - Has been on metolazone 2.5 mg Mon/Thurs in past but has struggled with AKI requiring hospitalization, most recently 09/2018. - Will get BMET, and cautiously restart metolazone 2.5 mg x 1 1-2x/week, with close lab follow up. - Continue torsemide to 100 mg bid.  - Continue spironolactone 25 mg daily. - BMET today, repeat in 7-10 days. Will call and  give metolazone instructions depending on SCr/BUN. 2. CKD Stage 3: He has been off Naproxen. - He uses Tylenol or tramadol for pain.  - BMET today.  3. Pulmonary hypertension: Patient thought to have [place] out of proportion to his lung disease (ILD, NSIP versus UIP). RHC in 2/20 showed mean PA pressure 41, PVR 5.1 WU, CI 3. Echo in 6/20 with RV reportedly ok =>I reviewed and do not think that RV was well-visualized. Patient did not tolerate Adcirca due to edema/?cognitive effects.  RHC was done again in 6/20, showed moderate pulmonary arterial hypertension with PVR 5.35 (comparable to prior).  Serological workup for PH was negative. Patient started ambrisentan 5 mg and developed significant edema/volume overload despite a high dose of torsemide. He is now on Uptravi + riociguat.  Most recent echo in 3/21 showed normal RV size/systolic function and PA systolic pressure estimate was normal.  6 minute walk is significantly improved at last OV, though he wis volume overloaded.  - he will stay off ambrisentan (? trigger for volume overload).  - As above, he did not tolerate tadalafil.  - Continue Uptravi, now up to 1600 mcg bid. Can consider increase in future but would like to get volume under control first.  - Continue riociguat at goal dose  2.5 mg tid.  4. Atrial fibrillation: Paroxysmal, noted on ILR. Regular on exam & ECG today - Continue apixaban 2.5 mg bid (dosed for age, renal dysfunction). No bleeding issues. 5. OSA: Continue CPAP.  6. CVA: In 3/21, may have been atrial fibrillation-related.  On apixaban now.  7. Carotid stenosis: 40-59% RICA stenosis on 3/21 dopplers.  - Repeat carotid dopplers in 3/22.  8. Interstitial lung disease: Followed by Dr. Vaughan Browner.  - Hi-Res CT (12/21) early mild ILD - PFTs (12/21) improved from 2019; FVC of 82%, ratio 77, TLC of 77% with DLCO of 57% that corrects for alveolar volume - If progression of lung disease may consider initiation of antifibrotics; not a  candidate for biopsy due to presence of pulmonary hypertension.   Will closely follow kidney function, and patient understands to call if worsening swelling/SOB. Followup with Dr. Aundra Dubin in 2 months.  Maricela Bo [facility] FNP-BC 02/27/2020

## 2020-02-26 ENCOUNTER — Telehealth (HOSPITAL_COMMUNITY): Payer: Self-pay | Admitting: Pharmacist

## 2020-02-26 NOTE — Telephone Encounter (Signed)
Obtained [facility] [facility] grant for copay assistance with Adempas and Uptravi:   ID: [id number] Bin: [id number] PCN: [id number] Group: [id number] Aquilla Solian Amount: $10,000 Start Date: 01/27/20 End Date: 01/25/21  Audry Riles, PharmD, BCPS, BCCP, CPP Heart Failure Clinic Pharmacist [phone]

## 2020-02-27 ENCOUNTER — Other Ambulatory Visit: Payer: Self-pay

## 2020-02-27 ENCOUNTER — Encounter (HOSPITAL_COMMUNITY): Payer: Self-pay

## 2020-02-27 ENCOUNTER — Ambulatory Visit (HOSPITAL_COMMUNITY)
Admission: RE | Admit: 2020-02-27 | Discharge: 2020-02-27 | Disposition: A | Discharge status: Home / Self Care | Payer: Medicare HMO | Source: Ambulatory Visit | Attending: Family Medicine | Admitting: Family Medicine

## 2020-02-27 ENCOUNTER — Telehealth (HOSPITAL_COMMUNITY): Payer: Self-pay | Admitting: Family Medicine

## 2020-02-27 VITALS — BP 115/73 | HR 83 | Wt 185.4 lb

## 2020-02-27 DIAGNOSIS — Z9989 Dependence on other enabling machines and devices: Secondary | ICD-10-CM | POA: Insufficient documentation

## 2020-02-27 DIAGNOSIS — Z8673 Personal history of transient ischemic attack (TIA), and cerebral infarction without residual deficits: Secondary | ICD-10-CM | POA: Diagnosis not present

## 2020-02-27 DIAGNOSIS — I272 Pulmonary hypertension, unspecified: Secondary | ICD-10-CM | POA: Diagnosis not present

## 2020-02-27 DIAGNOSIS — J8410 Pulmonary fibrosis, unspecified: Secondary | ICD-10-CM | POA: Diagnosis not present

## 2020-02-27 DIAGNOSIS — N183 Chronic kidney disease, stage 3 unspecified: Secondary | ICD-10-CM | POA: Diagnosis not present

## 2020-02-27 DIAGNOSIS — Z9981 Dependence on supplemental oxygen: Secondary | ICD-10-CM | POA: Insufficient documentation

## 2020-02-27 DIAGNOSIS — Z79899 Other long term (current) drug therapy: Secondary | ICD-10-CM | POA: Insufficient documentation

## 2020-02-27 DIAGNOSIS — G4733 Obstructive sleep apnea (adult) (pediatric): Secondary | ICD-10-CM

## 2020-02-27 DIAGNOSIS — J849 Interstitial pulmonary disease, unspecified: Secondary | ICD-10-CM

## 2020-02-27 DIAGNOSIS — Z8572 Personal history of non-Hodgkin lymphomas: Secondary | ICD-10-CM | POA: Insufficient documentation

## 2020-02-27 DIAGNOSIS — I480 Paroxysmal atrial fibrillation: Secondary | ICD-10-CM | POA: Diagnosis not present

## 2020-02-27 DIAGNOSIS — N1831 Chronic kidney disease, stage 3a: Secondary | ICD-10-CM | POA: Diagnosis not present

## 2020-02-27 DIAGNOSIS — Z87891 Personal history of nicotine dependence: Secondary | ICD-10-CM | POA: Insufficient documentation

## 2020-02-27 DIAGNOSIS — I2721 Secondary pulmonary arterial hypertension: Secondary | ICD-10-CM | POA: Insufficient documentation

## 2020-02-27 DIAGNOSIS — I5032 Chronic diastolic (congestive) heart failure: Secondary | ICD-10-CM

## 2020-02-27 DIAGNOSIS — Z7901 Long term (current) use of anticoagulants: Secondary | ICD-10-CM | POA: Insufficient documentation

## 2020-02-27 DIAGNOSIS — Z950 Presence of cardiac pacemaker: Secondary | ICD-10-CM | POA: Diagnosis not present

## 2020-02-27 DIAGNOSIS — I130 Hypertensive heart and chronic kidney disease with heart failure and stage 1 through stage 4 chronic kidney disease, or unspecified chronic kidney disease: Secondary | ICD-10-CM | POA: Diagnosis present

## 2020-02-27 DIAGNOSIS — I6529 Occlusion and stenosis of unspecified carotid artery: Secondary | ICD-10-CM

## 2020-02-27 LAB — BASIC METABOLIC PANEL
Anion gap: 15 (ref 5–15)
BUN: 67 mg/dL — ABNORMAL HIGH (ref 8–23)
CO2: 31 mmol/L (ref 22–32)
Calcium: 7.6 mg/dL — ABNORMAL LOW (ref 8.9–10.3)
Chloride: 88 mmol/L — ABNORMAL LOW (ref 98–111)
Creatinine, Ser: 2.27 mg/dL — ABNORMAL HIGH (ref 0.61–1.24)
GFR, Estimated: 27 mL/min — ABNORMAL LOW (ref >60)
Glucose, Bld: 94 mg/dL (ref 70–99)
Potassium: 3.8 mmol/L (ref 3.5–5.1)
Sodium: 134 mmol/L — ABNORMAL LOW (ref 135–145)

## 2020-02-27 NOTE — Progress Notes (Signed)
ReDS Vest / Clip - 02/27/20 1100      ReDS Vest / Clip   Station Marker D    Ruler Value 33    ReDS Value Range Moderate volume overload    ReDS Actual Value 38

## 2020-02-27 NOTE — Telephone Encounter (Signed)
LMOM to call back regarding labs and further medication instructions.

## 2020-02-27 NOTE — Patient Instructions (Signed)
Labs done today, your results will be available in MyChart, we will contact you for abnormal readings.  Your physician recommends that you schedule repeat labs in 7-10 days  Your physician recommends that you schedule a follow-up appointment in: 2 months  If you have any questions or concerns before your next appointment please send [place] a message through [place] or call our office at [phone].    TO LEAVE A MESSAGE FOR THE NURSE SELECT OPTION 2, PLEASE LEAVE A MESSAGE INCLUDING: . YOUR NAME . DATE OF BIRTH . CALL BACK NUMBER . REASON FOR CALL**this is important as we prioritize the call backs  YOU WILL RECEIVE A CALL BACK THE SAME DAY AS LONG AS YOU CALL BEFORE 4:00 PM

## 2020-02-28 ENCOUNTER — Telehealth (HOSPITAL_COMMUNITY): Payer: Self-pay

## 2020-02-28 NOTE — Telephone Encounter (Signed)
Malena Edman, RN  02/28/2020 10:50 AM EST Back to Top     Left detailed message on wife's phone (per request) about medication changes   Malena Edman, RN  02/28/2020 10:47 AM EST      Patient advised and verbalized understanding   Rafael Bihari, [place]  02/27/2020 3:29 PM EST      Left message to call back regarding labs

## 2020-02-28 NOTE — Telephone Encounter (Signed)
-----  Message from Rafael Bihari, [place] sent at 02/27/2020  1:58 PM EST ----- Kidney function elevated. Hold off on re-starting metolazone until we get repeat labs next week. Hold tonight's dose of torsemide. May resume in the morning.

## 2020-03-05 ENCOUNTER — Other Ambulatory Visit: Payer: Self-pay

## 2020-03-05 ENCOUNTER — Ambulatory Visit (HOSPITAL_COMMUNITY)
Admission: RE | Admit: 2020-03-05 | Discharge: 2020-03-05 | Disposition: A | Discharge status: Home / Self Care | Payer: Medicare HMO | Source: Ambulatory Visit | Attending: Internal Medicine | Admitting: Internal Medicine

## 2020-03-05 ENCOUNTER — Other Ambulatory Visit (HOSPITAL_COMMUNITY): Payer: Medicare HMO

## 2020-03-05 ENCOUNTER — Telehealth (HOSPITAL_COMMUNITY): Payer: Self-pay | Admitting: Pharmacist

## 2020-03-05 DIAGNOSIS — I5032 Chronic diastolic (congestive) heart failure: Secondary | ICD-10-CM

## 2020-03-05 DIAGNOSIS — I5022 Chronic systolic (congestive) heart failure: Secondary | ICD-10-CM | POA: Insufficient documentation

## 2020-03-05 LAB — BASIC METABOLIC PANEL
Anion gap: 13 (ref 5–15)
BUN: 66 mg/dL — ABNORMAL HIGH (ref 8–23)
CO2: 29 mmol/L (ref 22–32)
Calcium: 7.7 mg/dL — ABNORMAL LOW (ref 8.9–10.3)
Chloride: 96 mmol/L — ABNORMAL LOW (ref 98–111)
Creatinine, Ser: 1.89 mg/dL — ABNORMAL HIGH (ref 0.61–1.24)
GFR, Estimated: 34 mL/min — ABNORMAL LOW (ref >60)
Glucose, Bld: 88 mg/dL (ref 70–99)
Potassium: 4.5 mmol/L (ref 3.5–5.1)
Sodium: 138 mmol/L (ref 135–145)

## 2020-03-05 NOTE — Telephone Encounter (Signed)
Patient Advocate Encounter   Received notification from [employer] that prior authorization for Ryan [name] is required.   PA submitted on CoverMyMeds Key [id number] Status is pending   Will continue to follow.   Audry Riles, PharmD, BCPS, BCCP, CPP Heart Failure Clinic Pharmacist [phone]

## 2020-03-06 ENCOUNTER — Other Ambulatory Visit (HOSPITAL_COMMUNITY): Payer: Self-pay

## 2020-03-06 ENCOUNTER — Telehealth (HOSPITAL_COMMUNITY): Payer: Self-pay | Admitting: Pharmacy Technician

## 2020-03-06 MED ORDER — ADEMPAS 2.5 MG PO TABS: 2.5000 mg | ORAL_TABLET | Freq: Three times a day (TID) | ORAL | 3 refills | Status: DC

## 2020-03-06 NOTE — Telephone Encounter (Signed)
Spoke with [name], Mr. Dyal was approved conditionally for the next month. They are being sent some information in the mail to fill out for TAF. Sheral Flow that I could send that in on their behalf via fax as well.  Once the paperwork is sent in and received, the TAF grant should be approved through the remainder of the year.  Charlann Boxer, CPhT

## 2020-03-06 NOTE — Telephone Encounter (Signed)
Advanced Heart Failure Patient Advocate Encounter  Prior Authorization for Ryan [name] has been approved.    Effective dates: 02/02/20 through 01/31/21  Audry Riles, PharmD, BCPS, BCCP, CPP Heart Failure Clinic Pharmacist [phone]

## 2020-03-06 NOTE — Telephone Encounter (Signed)
Called and spoke to the patient's pharmacy.  They confirmed that the Adempas and Malvin Johns would both be delivered tomorrow morning. The representative confirmed that they were able to use the [employer], making the co-pay $0.  Called and updated the patient and his wife. I advised them to call and get a TAF ([employer]) grant. If approved, they will provide the [facility] medications for the remainder of the calendar year, regardless of cost. TAF requires patients to enroll themselves, phone number provided [phone]. We will call and get the [place] resent, if he is approved for TAF. Asked for the patient to call me and let me know the TAF determination.   Charlann Boxer, CPhT

## 2020-03-07 ENCOUNTER — Telehealth (HOSPITAL_COMMUNITY): Payer: Self-pay | Admitting: *Deleted

## 2020-03-07 DIAGNOSIS — I5032 Chronic diastolic (congestive) heart failure: Secondary | ICD-10-CM

## 2020-03-07 NOTE — Telephone Encounter (Signed)
Pt wife aware and repeat labs scheduled for 2 weeks.

## 2020-03-07 NOTE — Telephone Encounter (Signed)
pts wife left VM stating she was waiting on lab results to see if patient should start metolazone twice weekly. She said she expected to receive a call from [facility], [place].  Routed to [employer] for advice

## 2020-03-07 NOTE — Progress Notes (Signed)
Carelink Summary Report / Loop Recorder

## 2020-03-07 NOTE — Addendum Note (Signed)
Addended by: Harvie Junior on: 03/07/2020 03:02 PM   Modules accepted: Orders

## 2020-03-07 NOTE — Telephone Encounter (Signed)
He can restart metolazone 2.5 mg once a week, I would like to hold off on 2x week until his kidney function improves further.

## 2020-03-20 ENCOUNTER — Other Ambulatory Visit (HOSPITAL_COMMUNITY): Payer: Self-pay | Admitting: *Deleted

## 2020-03-20 MED ORDER — METOLAZONE 2.5 MG PO TABS: 2.5000 mg | ORAL_TABLET | ORAL | 3 refills | Status: DC

## 2020-03-24 ENCOUNTER — Other Ambulatory Visit (HOSPITAL_COMMUNITY): Payer: Medicare HMO

## 2020-03-26 ENCOUNTER — Telehealth (HOSPITAL_COMMUNITY): Payer: Self-pay | Admitting: Pharmacy Technician

## 2020-03-26 NOTE — Telephone Encounter (Signed)
Sent documentation to TAF on patient's behalf via fax.

## 2020-03-27 LAB — CUP PACEART REMOTE DEVICE CHECK
Date Time Interrogation Session: 20220224062156
Implantable Pulse Generator Implant Date: 20210325

## 2020-03-31 ENCOUNTER — Ambulatory Visit (INDEPENDENT_AMBULATORY_CARE_PROVIDER_SITE_OTHER): Payer: Medicare HMO

## 2020-03-31 ENCOUNTER — Telehealth (HOSPITAL_COMMUNITY): Payer: Self-pay | Admitting: *Deleted

## 2020-03-31 DIAGNOSIS — I639 Cerebral infarction, unspecified: Secondary | ICD-10-CM | POA: Diagnosis not present

## 2020-03-31 NOTE — Telephone Encounter (Signed)
pts wife called stating pt is having cramping all over and the nurse at [facility] thinks pts potassium is low. Per Janett Billow get bmet . Lab appt scheduled for tomorrow.

## 2020-03-31 NOTE — Telephone Encounter (Signed)
Patient's wife called and left a message stating that TAF confirmed they received the documents and he was approved through 01/31/21.  Charlann Boxer, CPhT

## 2020-04-01 ENCOUNTER — Ambulatory Visit (HOSPITAL_COMMUNITY)
Admission: RE | Admit: 2020-04-01 | Discharge: 2020-04-01 | Disposition: A | Discharge status: Home / Self Care | Payer: Medicare HMO | Source: Ambulatory Visit | Attending: Cardiology | Admitting: Cardiology

## 2020-04-01 ENCOUNTER — Other Ambulatory Visit: Payer: Self-pay

## 2020-04-01 DIAGNOSIS — I5032 Chronic diastolic (congestive) heart failure: Secondary | ICD-10-CM | POA: Insufficient documentation

## 2020-04-01 LAB — BASIC METABOLIC PANEL
Anion gap: 14 (ref 5–15)
BUN: 75 mg/dL — ABNORMAL HIGH (ref 8–23)
CO2: 31 mmol/L (ref 22–32)
Calcium: 7.8 mg/dL — ABNORMAL LOW (ref 8.9–10.3)
Chloride: 91 mmol/L — ABNORMAL LOW (ref 98–111)
Creatinine, Ser: 2.44 mg/dL — ABNORMAL HIGH (ref 0.61–1.24)
GFR, Estimated: 25 mL/min — ABNORMAL LOW (ref >60)
Glucose, Bld: 92 mg/dL (ref 70–99)
Potassium: 4 mmol/L (ref 3.5–5.1)
Sodium: 136 mmol/L (ref 135–145)

## 2020-04-04 ENCOUNTER — Other Ambulatory Visit (HOSPITAL_COMMUNITY): Payer: Self-pay | Admitting: *Deleted

## 2020-04-04 DIAGNOSIS — I5032 Chronic diastolic (congestive) heart failure: Secondary | ICD-10-CM

## 2020-04-07 ENCOUNTER — Other Ambulatory Visit (HOSPITAL_COMMUNITY): Payer: Self-pay | Admitting: Cardiology

## 2020-04-07 ENCOUNTER — Ambulatory Visit (HOSPITAL_COMMUNITY)
Admission: RE | Admit: 2020-04-07 | Discharge: 2020-04-07 | Disposition: A | Discharge status: Home / Self Care | Payer: Medicare HMO | Source: Ambulatory Visit | Attending: Internal Medicine | Admitting: Internal Medicine

## 2020-04-07 ENCOUNTER — Other Ambulatory Visit: Payer: Self-pay

## 2020-04-07 DIAGNOSIS — I5032 Chronic diastolic (congestive) heart failure: Secondary | ICD-10-CM | POA: Insufficient documentation

## 2020-04-07 LAB — BASIC METABOLIC PANEL
Anion gap: 19 — ABNORMAL HIGH (ref 5–15)
BUN: 119 mg/dL — ABNORMAL HIGH (ref 8–23)
CO2: 27 mmol/L (ref 22–32)
Calcium: 7.2 mg/dL — ABNORMAL LOW (ref 8.9–10.3)
Chloride: 86 mmol/L — ABNORMAL LOW (ref 98–111)
Creatinine, Ser: 3.06 mg/dL — ABNORMAL HIGH (ref 0.61–1.24)
GFR, Estimated: 19 mL/min — ABNORMAL LOW (ref >60)
Glucose, Bld: 97 mg/dL (ref 70–99)
Potassium: 3.5 mmol/L (ref 3.5–5.1)
Sodium: 132 mmol/L — ABNORMAL LOW (ref 135–145)

## 2020-04-07 NOTE — Progress Notes (Signed)
Carelink Summary Report / Loop Recorder

## 2020-04-11 ENCOUNTER — Ambulatory Visit (HOSPITAL_COMMUNITY)
Admission: RE | Admit: 2020-04-11 | Discharge: 2020-04-11 | Disposition: A | Discharge status: Home / Self Care | Payer: Medicare HMO | Source: Ambulatory Visit | Attending: Cardiology | Admitting: Cardiology

## 2020-04-11 ENCOUNTER — Other Ambulatory Visit: Payer: Self-pay

## 2020-04-11 DIAGNOSIS — I5032 Chronic diastolic (congestive) heart failure: Secondary | ICD-10-CM | POA: Insufficient documentation

## 2020-04-11 LAB — BASIC METABOLIC PANEL
Anion gap: 11 (ref 5–15)
BUN: 103 mg/dL — ABNORMAL HIGH (ref 8–23)
CO2: 31 mmol/L (ref 22–32)
Calcium: 7.4 mg/dL — ABNORMAL LOW (ref 8.9–10.3)
Chloride: 90 mmol/L — ABNORMAL LOW (ref 98–111)
Creatinine, Ser: 2.31 mg/dL — ABNORMAL HIGH (ref 0.61–1.24)
GFR, Estimated: 27 mL/min — ABNORMAL LOW (ref >60)
Glucose, Bld: 106 mg/dL — ABNORMAL HIGH (ref 70–99)
Potassium: 3.9 mmol/L (ref 3.5–5.1)
Sodium: 132 mmol/L — ABNORMAL LOW (ref 135–145)

## 2020-04-14 ENCOUNTER — Encounter: Payer: Self-pay | Admitting: Adult Health

## 2020-04-14 ENCOUNTER — Ambulatory Visit: Payer: Medicare HMO | Admitting: Adult Health

## 2020-04-14 VITALS — BP 92/52 | HR 83 | Ht 67.0 in | Wt 183.0 lb

## 2020-04-14 DIAGNOSIS — G2581 Restless legs syndrome: Secondary | ICD-10-CM | POA: Diagnosis not present

## 2020-04-14 MED ORDER — ROPINIROLE HCL 2 MG PO TABS: ORAL_TABLET | ORAL | 0 refills | Status: DC

## 2020-04-14 MED ORDER — NEUPRO 2 MG/24HR TD PT24: 1.0000 | MEDICATED_PATCH | Freq: Every day | TRANSDERMAL | 12 refills | Status: DC

## 2020-04-14 NOTE — Progress Notes (Signed)
PATIENT: Ryan [name] DOB: 02-Oct-1931  REASON FOR VISIT: follow up HISTORY FROM: patient  HISTORY OF PRESENT ILLNESS: Today 04/14/20:  Ryan [name] is an 87 year old male with a history of restless leg syndrome and chronic back pain.  He returns today for follow-up.  He reports that he has not been taking the Neupro consistently.  Reports early on he would only place the patch on if he felt that he needed it.  He states for the last week he has been placing it consistently.  He takes Requip 2 mg up to 3 times a day.  He states that typically between 10 and 11 PM his symptoms will begin.  He describes it as a pins and needle sensation in the legs.  He states the hot shower used to help but it no longer does.  He states that the sensation is usually stop by 1 AM.  In the past he has been on gabapentin but was unable to tolerate it.  Patient is currently being monitored for his kidney function.  12/10/19: Ryan [name] is an 87 year old male with a history of restless leg syndrome and chronic back pain.  He returns today for follow-up.  The patient has been taking Requip and gabapentin but reports it does not offer him much benefit for his restless legs.  He states he can be up 3-4 times at night.  He also has symptoms throughout the day.  He states that gabapentin makes him very drowsy.  He has not taken it for the last 2 nights.  His B12 levels were low back in April.  He has been taking monthly B12 injections but has not had a shot in at least 2 months.  He returns today for an evaluation.   HISTORY 10/18/19:  Ryan [name] is an 87 year old male with a history of restless leg syndrome, CVA and chronic back pain.  He returns today to discuss restless legs.  He states that he is currently taking Requip 2 mg 3 times a day.  He states that he tries to take them 8 hours apart.  He states that he has been waking up during the night with symptoms.  He states that this can occur anytime between 10 and 12  and sometimes will last till 4 in the morning.  He states that a hot shower is sometimes beneficial.  Initially tramadol offered him the most benefit but it no longer works.  He states that he does not have any symptoms during the day.  He returns today for an evaluation.  REVIEW OF SYSTEMS: Out of a complete 14 system review of symptoms, the patient complains only of the following symptoms, and all other reviewed systems are negative.  See HPI  ALLERGIES: Allergies  Allergen Reactions  . Pollen Extract-Tree Extract Other (See Comments)    HEADACHES, TIRED , DRAINAGE FROM SINUSES  . Molds & Smuts Other (See Comments)    Also dust mites causes sinus infections, h/a etc.    HOME MEDICATIONS: Outpatient Medications Prior to Visit  Medication Sig Dispense Refill  . Acetaminophen (TYLENOL PO) Take by mouth.     Marland Kitchen allopurinol (ZYLOPRIM) 100 MG tablet Take 50 mg by mouth daily.    Marland Kitchen atorvastatin (LIPITOR) 40 MG tablet Take 1 tablet (40 mg total) by mouth daily at 6 PM. 30 tablet 1  . cyanocobalamin (,VITAMIN B-12,) 1000 MCG/ML injection One ml injection intramuscular once a week for 3 weeks and then monthly thereafter. 1 mL 14  .  diltiazem (CARDIZEM CD) 120 MG 24 hr capsule Take 1 capsule (120 mg total) by mouth daily. 90 capsule 1  . ELIQUIS 2.5 MG TABS tablet TAKE ONE TABLET TWICE DAILY 60 tablet 5  . escitalopram (LEXAPRO) 10 MG tablet Take 10 mg by mouth daily.    . finasteride (PROSCAR) 5 MG tablet Take 5 mg by mouth daily.    Marland Kitchen FLONASE SENSIMIST 27.5 MCG/SPRAY nasal spray Place 1 spray into the nose daily as needed for rhinitis or allergies.     Marland Kitchen gabapentin (NEURONTIN) 100 MG capsule Take 3 capsules (300 mg total) by mouth at bedtime. 90 capsule 1  . ipratropium (ATROVENT) 0.03 % nasal spray Place 2 sprays into both nostrils 2 (two) times daily.    Marland Kitchen latanoprost (XALATAN) 0.005 % ophthalmic solution Place 1 drop into both eyes at bedtime.     Marland Kitchen linaclotide (LINZESS) 290 MCG CAPS capsule  Take 290 mcg by mouth daily before breakfast.    . methocarbamol (ROBAXIN) 500 MG tablet Take 500-1,000 mg by mouth every 8 (eight) hours as needed for muscle spasms.    . methylcellulose (CITRUCEL) oral powder Mix 2 grams in 8 ounces of water and drink twice a day. 454 g 0  . omeprazole (PRILOSEC) 20 MG capsule Take 20 mg by mouth daily.    . RESTASIS 0.05 % ophthalmic emulsion 1 drop 2 (two) times daily.    . Riociguat (ADEMPAS) 2.5 MG TABS Take 2.5 mg by mouth 3 (three) times daily. 90 tablet 3  . rOPINIRole (REQUIP) 2 MG tablet Can take up to 2 tablets a day for breakthrough symptoms for the next 1-2 weeks while starting neupro patch 15 tablet 0  . Rotigotine (NEUPRO) 1 MG/24HR PT24 Place 2 patches (2 mg total) onto the skin daily. 30 patch 5  . Selexipag 1600 MCG TABS Take 1 tablet (1,600 mcg total) by mouth 2 (two) times daily. 60 tablet 6  . spironolactone (ALDACTONE) 25 MG tablet Take 0.5 tablets (12.5 mg total) by mouth daily. 15 tablet 3  . tiZANidine (ZANAFLEX) 2 MG tablet Take 2 mg by mouth 2 (two) times daily as needed for muscle spasms.    Marland Kitchen torsemide (DEMADEX) 20 MG tablet Take 5 tablets (100 mg total) by mouth 2 (two) times daily. 300 tablet 3  . traMADol (ULTRAM) 50 MG tablet Take 50 mg by mouth every 6 (six) hours as needed for moderate pain.     . traZODone (DESYREL) 100 MG tablet Take 100 mg by mouth at bedtime.    . metolazone (ZAROXOLYN) 2.5 MG tablet Take 1 tablet (2.5 mg total) by mouth once a week. 5 tablet 3   No facility-administered medications prior to visit.    PAST MEDICAL HISTORY: Past Medical History:  Diagnosis Date  . Asthma   . Barrett's esophagus   . Benign prostatic hypertrophy   . Bilateral lower extremity edema   . CHF (congestive heart failure) ([place])   . Chronic fatigue   . COPD (chronic obstructive pulmonary disease) ([place])   . Diabetes mellitus type 2 in nonobese ([place]) 04/24/2019  . Diverticulosis of colon (without mention of hemorrhage)   .  Esophageal reflux   . Gastritis   . Gluten intolerance   . Hiatal hernia 02/10/11   Noted on CT Scan - Moderate Hiatal Hernia  . Hyperlipemia   . Hypertension   . 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 Melanoma ([place])  lymphoma  . Microcytic anemia 04/20/2011   . 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 Osteoarthritis    hands/feet,knees, NECK, BACK  . Periaortic lymphadenopathy 02/16/2011  . Personal history of colonic polyps 2004   hyperplastic Dr. Collene Mares  . Pulmonary hyperinflation   . Renal cyst 02/10/11    Noted on CT Scan - Bilateral Renal Cysts  . S/P radiation therapy 03/15/11 - 04/09/11   Abdominal/ Pelvic Tumor, 3600 cGy/20 Fractions    PAST SURGICAL HISTORY: Past Surgical History:  Procedure Laterality Date  . ARTHROSCOPIC REPAIR ACL     right  . BONE MARROW ASPIRATION  02/25/11   Bone Marrow, Aspirate, Clot, and Bilateral Bx, Right PIC  . CATARACT EXTRACTION, BILATERAL    . EYE SURGERY    . HERNIA REPAIR     LEFT INGUINAL   . INSERTION OF MESH N/A 08/23/2012   Procedure: INSERTION OF MESH;  Surgeon: Madilyn Hook, DO;  Location: [facility];  Service: General;  Laterality: N/A;  . LOOP RECORDER INSERTION N/A 04/26/2019   Procedure: LOOP RECORDER INSERTION;  Surgeon: Evans Lance, MD;  Location: [facility];  Service: Cardiovascular;  Laterality: N/A;  . LUMBAR LAMINECTOMY/DECOMPRESSION MICRODISCECTOMY Right 12/31/2013   Procedure: RIGHT L4-5 L5-S1 LAMINECTOMY;  Surgeon: Kristeen Miss, MD;  Location: [facility];  Service: Neurosurgery;  Laterality: Right;  RIGHT L4-5 L5-S1 LAMINECTOMY  . RIGHT HEART CATH N/A 07/25/2018   Procedure: RIGHT HEART CATH;  Surgeon: Larey Dresser, MD;  Location: [facility];  Service: Cardiovascular;  Laterality: N/A;  . ROTATOR CUFF REPAIR     right  . TONSILLECTOMY    . TONSILLECTOMY    . VENTRAL HERNIA REPAIR N/A 08/23/2012    Procedure: HERNIA REPAIR VENTRAL ADULT;  Surgeon: Madilyn Hook, DO;  Location: [facility];  Service: General;  Laterality: N/A;    FAMILY HISTORY: Family History  Problem Relation Age of Onset  . Heart disease Father        MI 40  . Urticaria Father   . Prostate cancer Brother   . Prostate cancer Paternal Uncle   . Prostate cancer Paternal Uncle   . Prostate cancer Paternal Uncle   . Hyperlipidemia Other   . Stroke Other   . Hypertension Other   . ADD / ADHD Other   . Colon cancer Neg Hx   . Allergic rhinitis Neg Hx   . Asthma Neg Hx   . Eczema Neg Hx     SOCIAL HISTORY: Social History   Socioeconomic History  . Marital status: Married    Spouse name: Not on file  . Number of children: 2  . Years of education: Not on file  . Highest education level: Not on file  Occupational History  . 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 Smoking status: Former Smoker    Packs/day: 1.00    Years: 35.00    Pack years: 35.00    Types: Pipe, Cigarettes    Quit date: 02/23/1992    Years since quitting: 28.1  . Smokeless tobacco: Never Used  . Tobacco comment: quit 20 years ago  Vaping Use  . Vaping Use: Never used  Substance and Sexual Activity  . Alcohol use: Not Currently    Comment: 2 drinks daily scotch  AND WINE WITH SUPPER  . Drug use: No  . Sexual activity: Not on file  Other  Topics Concern  . Not on file  Social History Narrative   Married - second marriage   He has two children   Retired Horticulturist, commercial Professor   Currently teaches pottery making, 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[facility]   Former Smoker quit 12 years ago- smoked for 35 years   Alcohol use- not currently   Right handed   Social Determinants of Radio broadcast assistant Strain: Not on file  Food Insecurity: Not on file  Transportation Needs: Not on file  Physical Activity: Not on file  Stress: Not on file  Social Connections: Not on file  Intimate Partner  Violence: Not on file      PHYSICAL EXAM  Vitals:   04/14/20 1305  BP: (!) 92/52  Pulse: 83  Weight: 183 lb (83 kg)  Height: _0  (1.702 m)   Body mass index is 28.66 kg/m.  Generalized: Well developed, in no acute distress   Neurological examination  Mentation: Alert oriented to time, place, history taking. Follows all commands speech and language fluent Cranial nerve II-XII: Pupils were equal round reactive to light. Extraocular movements were full, visual field were full on confrontational test.  Head turning and shoulder shrug  were normal and symmetric. Motor: The motor testing reveals 5 over 5 strength of all 4 extremities. Good symmetric motor tone is noted throughout. 4+ pitting edema bilateral lower extremities Sensory: Sensory testing is intact to soft touch on all 4 extremities. No evidence of extinction is noted.  Coordination: Cerebellar testing reveals good finger-nose-finger and heel-to-shin bilaterally.  Gait and station: Uses a Rollator when ambulating. Reflexes: Deep tendon reflexes are symmetric and normal bilaterally.   DIAGNOSTIC DATA (LABS, IMAGING, TESTING) - I reviewed patient records, labs, notes, testing and imaging myself where available.  Lab Results  Component Value Date   WBC 7.5 12/10/2019   HGB 9.7 (L) 12/10/2019   HCT 33.2 (L) 12/10/2019   MCV 85 12/10/2019   PLT 328 12/10/2019      Component Value Date/Time   NA 132 (L) 04/11/2020 1356   NA 139 12/10/2019 1007   NA 143 06/26/2015 0916   K 3.9 04/11/2020 1356   K 3.8 06/26/2015 0916   CL 90 (L) 04/11/2020 1356   CL 105 04/07/2012 0925   CO2 31 04/11/2020 1356   CO2 32 (H) 06/26/2015 0916   GLUCOSE 106 (H) 04/11/2020 1356   GLUCOSE 102 06/26/2015 0916   GLUCOSE 86 04/07/2012 0925   BUN 103 (H) 04/11/2020 1356   BUN 56 (H) 12/10/2019 1007   BUN 14.0 06/26/2015 0916   CREATININE 2.31 (H) 04/11/2020 1356   CREATININE 1.0 06/26/2015 0916   CALCIUM 7.4 (L) 04/11/2020 1356    CALCIUM 8.9 06/26/2015 0916   PROT 6.6 12/10/2019 1007   PROT 6.2 (L) 06/26/2015 0916   ALBUMIN 4.4 12/10/2019 1007   ALBUMIN 3.5 06/26/2015 0916   AST 10 12/10/2019 1007   AST 17 06/26/2015 0916   ALT 10 12/10/2019 1007   ALT 15 06/26/2015 0916   ALKPHOS 100 12/10/2019 1007   ALKPHOS 87 06/26/2015 0916   BILITOT <0.2 12/10/2019 1007   BILITOT 0.50 06/26/2015 0916   GFRNONAA 27 (L) 04/11/2020 1356   GFRAA 32 (L) 12/10/2019 1007   Lab Results  Component Value Date   CHOL 159 09/24/2019   HDL 45 09/24/2019   LDLCALC 82 09/24/2019   LDLDIRECT 165.7 12/23/2008   TRIG 159 (H) 09/24/2019   CHOLHDL 3.5 09/24/2019   Lab  Results  Component Value Date   HGBA1C 5.9 (H) 04/25/2019   Lab Results  Component Value Date   VITAMINB12 817 12/10/2019   Lab Results  Component Value Date   TSH 1.444 06/11/2011      ASSESSMENT AND PLAN 87 y.o. year old male  has a past medical history of Asthma, Barrett's esophagus, Benign prostatic hypertrophy, Bilateral lower extremity edema, CHF (congestive heart failure) ([place]), Chronic fatigue, COPD (chronic obstructive pulmonary disease) ([place]), Diabetes mellitus type 2 in nonobese ([place]) (04/24/2019), Diverticulosis of colon (without mention of hemorrhage), Esophageal reflux, Gastritis, Gluten intolerance, Hiatal hernia (02/10/11), Hyperlipemia, Hypertension, Lymphoma of lymph nodes in pelvis ([place]) (03/03/2011), Melanoma ([place]), Microcytic anemia (04/20/2011 ), NHL (non-Hodgkin's lymphoma) ([place]), Osteoarthritis, Periaortic lymphadenopathy (02/16/2011), Personal history of colonic polyps (2004), Pulmonary hyperinflation, Renal cyst (02/10/11), and S/P radiation therapy (03/15/11 - 04/09/11). here with:  1.  Restless leg syndrome  -Neupro 2 mg patch symptom.  Patient advised to apply this midmorning -Take Requip 2 mg at 9 PM.  Advised patient that the long-term goal is to get him off of Requip may have to increase Neupro patch to 3 mg in the future -Pitting edema  noted in the lower extremities patient is following with cardiology and pulmonology  -Advised if symptoms do not improve he should let us know -Follow-up in 3 to 4 months or sooner if needed   I spent 30 minutes of face-to-face and non-face-to-face time with patient.  This included previsit chart review, lab review, study review, order entry, electronic health record documentation, patient education.  Ward Givens, MSN, NP-C 04/14/2020, 1:11 PM [facility] [address], [place], [place] [address] [phone]

## 2020-04-14 NOTE — Patient Instructions (Signed)
Your Plan:  Place the neupro patch 2 mg on mid morning ( new prescription sent) Take requip 2 mg at 9PM If your symptoms worsen or you develop new symptoms please let us know.    Thank you for coming to see [place] at [facility]. I hope we have been able to provide you high quality care today.  You may receive a patient satisfaction survey over the next few weeks. We would appreciate your feedback and comments so that we may continue to improve ourselves and the health of our patients.

## 2020-04-15 ENCOUNTER — Ambulatory Visit: Payer: Medicare HMO | Admitting: Adult Health

## 2020-04-16 ENCOUNTER — Other Ambulatory Visit: Payer: Self-pay

## 2020-04-16 ENCOUNTER — Telehealth: Payer: Self-pay | Admitting: Adult Health

## 2020-04-16 ENCOUNTER — Other Ambulatory Visit: Payer: Medicare HMO | Admitting: Hospice

## 2020-04-16 DIAGNOSIS — Z515 Encounter for palliative care: Secondary | ICD-10-CM

## 2020-04-16 DIAGNOSIS — G2581 Restless legs syndrome: Secondary | ICD-10-CM

## 2020-04-16 NOTE — Telephone Encounter (Signed)
Called patient and advised NP stated if he has an attack of RL during the day he may take an extra Requip. Patient verbalized understanding, appreciation.

## 2020-04-16 NOTE — Telephone Encounter (Signed)
This is fine 

## 2020-04-16 NOTE — Progress Notes (Signed)
[facility] Consult Note Telephone: [phone]  Fax: [phone]  PATIENT NAME: Ryan [name] DOB: 1931/05/20 MRN: [id number]  PRIMARY CARE PROVIDER:   Roetta Sessions, NP Ryan Sessions, NP [address] [place],  [place] [address]  REFERRING PROVIDER: Roetta Sessions, NP Ryan Sessions, NP [address] [place],  [place] [address]  RESPONSIBLE PARTY:  Self  [phone]  Extended Emergency Contact Information Primary Emergency Contact: Ryan [name] Address: [place], [place] [address] [place] Phone: [phone] Mobile Phone: [phone] Relation: Spouse   Visit is to build trust and highlight Palliative Medicine as specialized medical care for people living with serious illness, aimed at facilitating better quality of life through symptoms relief, assisting with advance care plan and establishing goals of care.   CHIEF COMPLAINT: Palliative focused visit/restless leg syndrome  RECOMMENDATIONS/PLAN:   1. Advance Care Planning/Code Status: CODE STATUS reviewed.  Patient is a DO NOT RESUSCITATE.  2. Goals of Care: Goals of care include to maximize quality of life and symptom management.  Visit consisted of counseling and education dealing with the complex and emotionally intense issues of symptom management and palliative care in the setting of serious and potentially life-threatening illness.  Patient continues to enjoy writing listening to the news.  Palliative care team will continue to support patient, patient's family, and medical team.  I spent 16 minutes providing this consultation. More than 50% of the time in this consultation was spent on coordinating communication.   -------------------------------------------------------------------------------------------------------------------------------------------------- 3. Symptom management/Plan:  Restless leg syndrome: Continue to see neurologist as needed.  Reiterated instructions from neurologist: Neuro 27m daily at midmorning Requip 2 mg at 9 PM.  Plan is to wean patient from Requip and possibly increase Mirapex to 3 mg in the future.  Follow-up with cardiology and pulmonology as planned.  Get B12 shots monthly/regularly.  NP called Ryan [name] who gives him the shots in the facility and left her a message on the need for regularity.  Epic chart review shows that B12 levels was low back in April last year Palliative will continue to monitor for symptom management/decline and make recommendations as needed. Return 2 months or prn. Encouraged to call provider sooner with any concerns.   HISTORY OF PRESENT ILLNESS:  [name] a 87y.o. male with multiple medical problems including restless leg syndrome which is chronic with occasional flares.  Describes it as pins-and-needles sensation in his legs, worse at night. This impairs his independence and quality of life.  He says standing up helps.  He said he saw his neurologist few days ago and he tweaked his medications.  Patient continues to play poka, bingo and physical exercise.   History of congestive heart failure, COPD, hypertension, A. fib. History obtained from review of EMR, discussion with patient/family.   Review and summarization of Epic records shows history from other than patient. Rest of 10 point ROS asked and negative.  Palliative Care was asked to follow this patient by consultation request of Ryan [name], Ryan [name] to help address complex decision making in the context of advance care planning and goals of care clarification.   CODE STATUS: DNR  PPS: 50%  HOSPICE ELIGIBILITY/DIAGNOSIS: TBD  PAST MEDICAL HISTORY:  Past Medical History:   Diagnosis Date  . Asthma   . Barrett's esophagus   .  Benign prostatic hypertrophy   . Bilateral lower extremity edema   . CHF (congestive heart failure) ([facility])   . Chronic fatigue   . COPD (chronic obstructive pulmonary disease) ([facility])   . Diabetes mellitus type 2 in nonobese ([facility]) 04/24/2019  . Diverticulosis of colon (without mention of hemorrhage)   . Esophageal reflux   . Gastritis   . Gluten intolerance   . Hiatal hernia 02/10/11   Noted on CT Scan - Moderate Hiatal Hernia  . Hyperlipemia   . Hypertension   . 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 Melanoma ([facility])    lymphoma  . Microcytic anemia 04/20/2011   . 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 Osteoarthritis    hands/feet,knees, NECK, BACK  . Periaortic lymphadenopathy 02/16/2011  . Personal history of colonic polyps 2004   hyperplastic Dr. Collene Mares  . Pulmonary hyperinflation   . Renal cyst 02/10/11    Noted on CT Scan - Bilateral Renal Cysts  . S/P radiation therapy 03/15/11 - 04/09/11   Abdominal/ Pelvic Tumor, 3600 cGy/20 Fractions    SOCIAL HX: Patient lives independently at home.  He gets the services of [facility] at the independent living facility.  [name] is involved in his care.     FAMILY HX:  Family History  Problem Relation Age of Onset  . Heart disease Father        MI 39  . Urticaria Father   . Prostate cancer Brother   . Prostate cancer Paternal Uncle   . Prostate cancer Paternal Uncle   . Prostate cancer Paternal Uncle   . Hyperlipidemia Other   . Stroke Other   . Hypertension Other   . ADD / ADHD Other   . Colon cancer Neg Hx   . Allergic rhinitis Neg Hx   . Asthma Neg Hx   . Eczema Neg Hx     Review lab tests/diagnostics No results for input(s): WBC, HGB, HCT, PLT, MCV in the last 168 hours. Recent Labs  Lab 04/11/20 1356  NA 132*  K 3.9  CL 90*  CO2 31  BUN 103*  CREATININE 2.31*   GLUCOSE 106*   Latest GFR by Cockcroft Gault (not valid in AKI or ESRD) Estimated Creatinine Clearance: 23.7 mL/min (A) (by C-G formula based on SCr of 2.31 mg/dL (H)). No results for input(s): AST, ALT, ALKPHOS, GGT in the last 168 hours.  Invalid input(s): TBILI, CONJBILI, ALB, TOTALPROTEIN No components found for: ALB No results for input(s): APTT, INR in the last 168 hours.  Invalid input(s): PTPATIENT No results for input(s): BNP, PROBNP in the last 168 hours.  ALLERGIES:  Allergies  Allergen Reactions  . Pollen Extract-Tree Extract Other (See Comments)    HEADACHES, TIRED , DRAINAGE FROM SINUSES  . Molds & Smuts Other (See Comments)    Also dust mites causes sinus infections, h/a etc.      PERTINENT MEDICATIONS:  Outpatient Encounter Medications as of 04/16/2020  Medication Sig  . Acetaminophen (TYLENOL PO) Take by mouth.   Marland Kitchen allopurinol (ZYLOPRIM) 100 MG tablet Take 50 mg by mouth daily.  Marland Kitchen atorvastatin (LIPITOR) 40 MG tablet Take 1 tablet (40 mg total) by mouth daily at 6 PM.  . cyanocobalamin (,VITAMIN B-12,) 1000 MCG/ML injection One ml injection intramuscular once a week for 3 weeks and then monthly thereafter.  . diltiazem (CARDIZEM CD) 120 MG 24 hr capsule  Take 1 capsule (120 mg total) by mouth daily.  Marland Kitchen ELIQUIS 2.5 MG TABS tablet TAKE ONE TABLET TWICE DAILY  . escitalopram (LEXAPRO) 10 MG tablet Take 10 mg by mouth daily.  . finasteride (PROSCAR) 5 MG tablet Take 5 mg by mouth daily.  Marland Kitchen FLONASE SENSIMIST 27.5 MCG/SPRAY nasal spray Place 1 spray into the nose daily as needed for rhinitis or allergies.   Marland Kitchen ipratropium (ATROVENT) 0.03 % nasal spray Place 2 sprays into both nostrils 2 (two) times daily.  Marland Kitchen latanoprost (XALATAN) 0.005 % ophthalmic solution Place 1 drop into both eyes at bedtime.   Marland Kitchen linaclotide (LINZESS) 290 MCG CAPS capsule Take 290 mcg by mouth daily before breakfast.  . methocarbamol (ROBAXIN) 500 MG tablet Take 500-1,000 mg by mouth every 8 (eight)  hours as needed for muscle spasms.  . methylcellulose (CITRUCEL) oral powder Mix 2 grams in 8 ounces of water and drink twice a day.  Marland Kitchen omeprazole (PRILOSEC) 20 MG capsule Take 20 mg by mouth daily.  . RESTASIS 0.05 % ophthalmic emulsion 1 drop 2 (two) times daily.  . Riociguat (ADEMPAS) 2.5 MG TABS Take 2.5 mg by mouth 3 (three) times daily.  Marland Kitchen rOPINIRole (REQUIP) 2 MG tablet Take 1 tablet at 9PM while restarting Neupro patch.  . rotigotine (NEUPRO) 2 MG/24HR Place 1 patch onto the skin daily.  . Selexipag 1600 MCG TABS Take 1 tablet (1,600 mcg total) by mouth 2 (two) times daily.  Marland Kitchen spironolactone (ALDACTONE) 25 MG tablet Take 0.5 tablets (12.5 mg total) by mouth daily.  Marland Kitchen tiZANidine (ZANAFLEX) 2 MG tablet Take 2 mg by mouth 2 (two) times daily as needed for muscle spasms.  Marland Kitchen torsemide (DEMADEX) 20 MG tablet Take 5 tablets (100 mg total) by mouth 2 (two) times daily.  . traMADol (ULTRAM) 50 MG tablet Take 50 mg by mouth every 6 (six) hours as needed for moderate pain.   . traZODone (DESYREL) 100 MG tablet Take 100 mg by mouth at bedtime.   No facility-administered encounter medications on file as of 04/16/2020.    ROS  General: NAD EYES: No vision changes, no ENMT: No dysphagia no xerostomia Cardiovascular: No chest pain Pulmonary: No cough, SOB  Abdomen:no constipation or diarrhea, occasionally gas GU: No dysuria or urinary frequency MSK:   ROM limitations, no falls reported, Restless leg Skin: No rashes or wounds Neurological: weakness Psych:  positive mood Heme/lymph/immuno: No bruises or abnormal bleeding   PHYSICAL EXAM  General: In no acute distress,  Cardiovascular: regular rate and rhythm; 1+ pitting edema to bilateral feet, improved from 2+ last visit Pulmonary: no cough, no increased work of breathing, normal respiratory effort on room air Abdomen: soft, non tender, positive bowel sounds in all quadrants GU:  no suprapubic tenderness Eyes: Normal lids, no discharge,  sclera anicteric ENMT: Moist mucous membranes Musculoskeletal:  no joint deformity, uses a rolling walker for ambulation Skin: no rash to visible skin, warm without cyanosis Psych: non-anxious affect Neurological: Weakness but otherwise non focal Heme/lymph/immuno: no bruises, no bleeding  Thank you for the opportunity to participate in the care of VERLAND SPRINKLE Please call our office at [phone] if we can be of additional assistance.  Note: Portions of this note were generated with [employer]. Dictation errors may occur despite best attempts at proofreading.  Teodoro Spray, NP

## 2020-04-16 NOTE — Telephone Encounter (Signed)
Pt called wanting to speak to the RN or provider about his rOPINIRole (REQUIP) 2 MG tablet. Pt states that now that the dosage has been decreased he is having two attacks instead of one. Pt would like to discuss about another option or increasing his dosage back. Please advise.

## 2020-04-16 NOTE — Telephone Encounter (Signed)
Called patient who stated he has had to take an extra requip yesterday and today in the afternoon. He stated his RLS gets so bad he "can't stand it". He was taking it three x a day, wants to know if he can take it twice a day.  I reviewed NP's plan with him; he stated he understands but can't stand it, having two attacks a day instead of one. I advised will discuss with NP and call him back. Patient verbalized understanding, appreciation.

## 2020-04-17 ENCOUNTER — Telehealth (HOSPITAL_COMMUNITY): Payer: Self-pay | Admitting: *Deleted

## 2020-04-17 ENCOUNTER — Telehealth: Payer: Self-pay | Admitting: *Deleted

## 2020-04-17 NOTE — Telephone Encounter (Signed)
Attempted to completed key: [id number] on covermymeds for Neupro 4m patches. The patient has coverage through [place] Eligibility was confirmed. The plan came back with the following:  The patient currently has access to the requested medication and a Prior Authorization is not needed for the patient/medication.  I have faxed this information to his pharmacy, [facility] with instructions for them to contact the pharmacy help desk, if they are still having difficulty filling the prescription.

## 2020-04-17 NOTE — Telephone Encounter (Signed)
Pt left Vm stating his weights is up 4lbs x 4days. He feels as if he is retaining fluid and has some swelling. Pt denies chest pain, shortness of breath, and fatigue. Pt taking all medications as prescribed.   Routed to [employer], [place] for advice

## 2020-04-17 NOTE — Telephone Encounter (Signed)
Is he back to taking once weekly metolazone?

## 2020-04-21 ENCOUNTER — Ambulatory Visit: Payer: Medicare HMO | Admitting: Adult Health

## 2020-04-21 NOTE — Telephone Encounter (Signed)
Message was not routed back to me on 04/17/20. Pt is currently not taking metolazone. Metolazone stopped on 04/07/20 per Dr.McLean.

## 2020-04-24 ENCOUNTER — Telehealth (HOSPITAL_COMMUNITY): Payer: Self-pay | Admitting: *Deleted

## 2020-04-24 NOTE — Telephone Encounter (Signed)
Pt called stating his weight is up 8lbs in 2-3 weeks. He is not currently on metolazone but is taking everything else as prescribed. Pt denies shortness of breath, swelling, and chest pain. Pt denies changes in diet and no increase in fluid intake.   Routed to Amy Clegg,NP-c for advice

## 2020-04-25 NOTE — Telephone Encounter (Signed)
Pt aware and agreeable with plan.

## 2020-04-25 NOTE — Telephone Encounter (Signed)
Please call and ask him to take 2.5 mg metolazone today.   Continue all other medications.   Thanks,  Juanjose Mojica NP-C

## 2020-04-29 ENCOUNTER — Ambulatory Visit (INDEPENDENT_AMBULATORY_CARE_PROVIDER_SITE_OTHER): Payer: Medicare HMO

## 2020-04-29 DIAGNOSIS — I6310 Cerebral infarction due to embolism of unspecified precerebral artery: Secondary | ICD-10-CM | POA: Diagnosis not present

## 2020-04-29 LAB — CUP PACEART REMOTE DEVICE CHECK
Date Time Interrogation Session: 20220329113942
Implantable Pulse Generator Implant Date: 20210325

## 2020-05-01 ENCOUNTER — Telehealth (HOSPITAL_COMMUNITY): Payer: Self-pay | Admitting: Cardiology

## 2020-05-01 NOTE — Telephone Encounter (Signed)
Pt called to report 4 lb decrease after taking a dose of metolazone. Pt is not happy with results Weight today 191.4 No SOB or swelling Reports increase need for O2  Ok to repeat metolazone?

## 2020-05-01 NOTE — Telephone Encounter (Signed)
  He needs to get BMET and can start Metolazone once a week.   If has worsening Shortness of breath he should go to the ED for an evaluation. Also would benefit from pulmonary follow up.   Mykala Mccready NP-C  6:12 PM

## 2020-05-02 NOTE — Telephone Encounter (Signed)
Called pt no answer will try patient again later.

## 2020-05-05 NOTE — Telephone Encounter (Signed)
Spoke with patient he said his weight is down and he is feeling better. He would like to discuss weekly metolazone with Dr.McLean during office visit tomorrow before he starts.

## 2020-05-06 ENCOUNTER — Ambulatory Visit (HOSPITAL_COMMUNITY)
Admission: RE | Admit: 2020-05-06 | Discharge: 2020-05-06 | Disposition: A | Discharge status: Home / Self Care | Payer: Medicare HMO | Source: Ambulatory Visit | Attending: Cardiology | Admitting: Cardiology

## 2020-05-06 ENCOUNTER — Telehealth: Payer: Self-pay | Admitting: Adult Health

## 2020-05-06 ENCOUNTER — Encounter (HOSPITAL_COMMUNITY): Payer: Self-pay | Admitting: Cardiology

## 2020-05-06 ENCOUNTER — Other Ambulatory Visit: Payer: Self-pay

## 2020-05-06 VITALS — BP 110/68 | HR 81 | Wt 182.4 lb

## 2020-05-06 DIAGNOSIS — Z7901 Long term (current) use of anticoagulants: Secondary | ICD-10-CM | POA: Insufficient documentation

## 2020-05-06 DIAGNOSIS — I5032 Chronic diastolic (congestive) heart failure: Secondary | ICD-10-CM | POA: Diagnosis not present

## 2020-05-06 DIAGNOSIS — G4733 Obstructive sleep apnea (adult) (pediatric): Secondary | ICD-10-CM | POA: Diagnosis not present

## 2020-05-06 DIAGNOSIS — Z8249 Family history of ischemic heart disease and other diseases of the circulatory system: Secondary | ICD-10-CM | POA: Insufficient documentation

## 2020-05-06 DIAGNOSIS — Z79899 Other long term (current) drug therapy: Secondary | ICD-10-CM | POA: Diagnosis not present

## 2020-05-06 DIAGNOSIS — J8410 Pulmonary fibrosis, unspecified: Secondary | ICD-10-CM | POA: Insufficient documentation

## 2020-05-06 DIAGNOSIS — Z87891 Personal history of nicotine dependence: Secondary | ICD-10-CM | POA: Diagnosis not present

## 2020-05-06 DIAGNOSIS — Z8572 Personal history of non-Hodgkin lymphomas: Secondary | ICD-10-CM | POA: Diagnosis not present

## 2020-05-06 DIAGNOSIS — Z8673 Personal history of transient ischemic attack (TIA), and cerebral infarction without residual deficits: Secondary | ICD-10-CM | POA: Diagnosis not present

## 2020-05-06 DIAGNOSIS — I6523 Occlusion and stenosis of bilateral carotid arteries: Secondary | ICD-10-CM | POA: Diagnosis not present

## 2020-05-06 DIAGNOSIS — I272 Pulmonary hypertension, unspecified: Secondary | ICD-10-CM | POA: Diagnosis present

## 2020-05-06 DIAGNOSIS — I480 Paroxysmal atrial fibrillation: Secondary | ICD-10-CM | POA: Insufficient documentation

## 2020-05-06 DIAGNOSIS — I130 Hypertensive heart and chronic kidney disease with heart failure and stage 1 through stage 4 chronic kidney disease, or unspecified chronic kidney disease: Secondary | ICD-10-CM | POA: Insufficient documentation

## 2020-05-06 DIAGNOSIS — N183 Chronic kidney disease, stage 3 unspecified: Secondary | ICD-10-CM | POA: Diagnosis not present

## 2020-05-06 DIAGNOSIS — I4510 Unspecified right bundle-branch block: Secondary | ICD-10-CM | POA: Insufficient documentation

## 2020-05-06 LAB — BASIC METABOLIC PANEL WITH GFR
Anion gap: 8 (ref 5–15)
BUN: 55 mg/dL — ABNORMAL HIGH (ref 8–23)
CO2: 35 mmol/L — ABNORMAL HIGH (ref 22–32)
Calcium: 8.3 mg/dL — ABNORMAL LOW (ref 8.9–10.3)
Chloride: 95 mmol/L — ABNORMAL LOW (ref 98–111)
Creatinine, Ser: 1.81 mg/dL — ABNORMAL HIGH (ref 0.61–1.24)
GFR, Estimated: 36 mL/min — ABNORMAL LOW
Glucose, Bld: 113 mg/dL — ABNORMAL HIGH (ref 70–99)
Potassium: 4.6 mmol/L (ref 3.5–5.1)
Sodium: 138 mmol/L (ref 135–145)

## 2020-05-06 LAB — FERRITIN: Ferritin: 7 ng/mL — ABNORMAL LOW (ref 24–336)

## 2020-05-06 LAB — IRON AND TIBC
Iron: 23 ug/dL — ABNORMAL LOW (ref 45–182)
Saturation Ratios: 5 % — ABNORMAL LOW (ref 17.9–39.5)
TIBC: 469 ug/dL — ABNORMAL HIGH (ref 250–450)
UIBC: 446 ug/dL

## 2020-05-06 LAB — BRAIN NATRIURETIC PEPTIDE: B Natriuretic Peptide: 65.2 pg/mL (ref 0.0–100.0)

## 2020-05-06 MED ORDER — METOLAZONE 2.5 MG PO TABS: ORAL_TABLET | ORAL | 3 refills | Status: DC

## 2020-05-06 NOTE — Progress Notes (Signed)
PCP: Roetta Sessions, NP Cardiology: Dr. Aundra Dubin  87 y.o. with history of interstitial lung disease (?NSIP vs UIP) on home oxygen, pulmonary hypertension, diastolic CHF, and prior non-Hodgkins lymphoma (pelvic mass, s/p XRT) presents for followup of CHF and pulmonary hypertension.  Patient has history of reportedly mild ILD (NSIP vs UIP) followed by a pulmonologist at [facility] and pulmonary hypertension thought to be out of proportion lung disease followed by a physician at [facility].  Last CT chest was in 11/19, showing stable ILD (?UIP vs NSIP).  He had RHC in 2/20 with moderate PAH, PVR 5.1 WU.  He was tried on Adcirca but developed cognitive problems and worsening of peripheral edema so this was stopped.  He has been on home oxygen, uses at night and with exertion.   He was admitted in 6/20 with worsening dyspnea and peripheral edema, weight up despite compliance with torsemide 40 mg bid at home.  He has CKD stage 3 and torsemide was decreased in the spring. He was diuresed aggressively with fall in weight.  Echo showed normal LV size and systolic function, RV reportedly normal.  6/20 RHC showed normal filling pressures with moderate pulmonary hypertension, PVR 5.35 WU.   After discharge from hospital, patient started on ambrisentan.  His weight steadily began to increase and he developed worsening lower extremity edema, we stopped ambrisentan.   Patient was admitted in 8/20 with AKI after taking metolazone for several days.  He was admitted again in 8/20 with acute on chronic diastolic CHF. He was diuresed.  Wandering atrial pacemaker noted.   He was admitted in 3/21 with suspected CVA, MRI head showed punctate occipital lobe infarct, thought to be due to small vessel disease. ILR was implanted.  Echo in 3/21 showed EF 60-65%, severe LVH, normal RV.  PYP scan in 3/21 was not suggestive of TTR amyloidosis.  Subsequently, ILR has shown paroxysmal atrial fibrillation.   He was admitted later in  3/21 with AKI, creatinine up to 5.  However, he corrected rapidly to baseline with IVF and creatinine was 1.02 when discharge.   He returns for followup of CHF and pulmonary hypertension. Not using CPAP, feels like he cannot tolerate it.  He has been off scheduled metolazone for several weeks due to elevated creatinine. He walks with a walker, dyspnea after about 100 yards.  No lightheadedness or falls.  No chest pain.  He has slept in a recliner for 5 years.  He saw pulmonary in 1/22, pulmonary fibrosis thought to be mild and stable. He uses 2 L home oxygen chronically.  6 minute walk today was worse than prior.   ECG (personally reviewed): NSR, iRBBB  6 minute walk (8/21): 122 m 6 minute walk (11/21): 244 m 6 minute walk (3/22): 122 m  Labs (6/20): K 4.7, creatinine 1.46 Labs (7/20): K 3.1, creatinine 1.67 Labs (8/20): K 3.9, creatinine 1.8 Labs (9/20): K 4.9, creatinine 1.76, BNP 78 Labs (10/20): K 4.2, creatininine 1.5 Labs (11/20): BNP 36, K 4.3, creatinine 1.4 Labs (3/21): myeloma panel negative, urine immunofixation negative.  Labs (4/21): K 4.5, creatinine 1.02, hgb 11.4 Labs (6/21): K 5.1, creatinine 2.2, hgb 10.4 Labs (8/21): LDL 82 Labs (11/21): K 5.3, creatinine 2.11 Labs (3/22): K 3.9, creatinine 2.31  PMH: 1. Non-Hodgkin's lymphoma: Pelvic involvement.  S/p radiation.  Thought to be in remission (Dr. Benay Spice).  2. HTN 3. PACs/PVCs 4. GERD: Barrett's esophagus.  5. 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  - High resolution CT chest 11/19: ILD, ?  UIP pattern.  6. Pulmonary hypertension: Thought to be out of proportion to underlying lung disease (ILD, ?NSIP vs UIP).   - RHC (2/20): mean RA 10, PASP 60 with mean 42, mean PCWP 11, CI 3, PVR 5.1 WU.  - Trial of Adcirca => failed due to ?memory worsening/cognitive affects and peripheral edema.  - Echo (6/20): EF 60-65%, mild LVH, RV reportedly normal, PASP 59 mmHg.  - RHC (6/20): mean RA 7, PA 50/24 mean 34, mean PCWP 12, CI 2.01,  PAPI 3.7, PVR 5.35 WU - RF negative, ANA negative, anti-SCL70 negative, anti-centromere negative.  - V/Q scan (8/20): No evidence for chronic PE.  - Echo (3/21): EF 60-65%, severe LVH, normal RV.  7. CKD: Stage 3.   8. Wandering atrial pacemaker 9. OSA 10. PYP scan (3/21): H/CL ratio 1.0, grade 0.  11. CVA: MRI 3/21 with punctate occipital lobe infarct thought to be due to small vessel disease. - Carotid dopplers (3/21): 40-59% RICA stenosis.  12. Atrial fibrillation: Paroxysmal, noted on ILR.   Social History   Socioeconomic History  . Marital status: Married    Spouse name: Not on file  . Number of children: 2  . Years of education: Not on file  . Highest education level: Not on file  Occupational History  . 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 Smoking status: Former Smoker    Packs/day: 1.00    Years: 35.00    Pack years: 35.00    Types: Pipe, Cigarettes    Quit date: 02/23/1992    Years since quitting: 28.2  . Smokeless tobacco: Never Used  . Tobacco comment: quit 20 years ago  Vaping Use  . Vaping Use: Never used  Substance and Sexual Activity  . Alcohol use: Not Currently    Comment: 2 drinks daily scotch  AND WINE WITH SUPPER  . Drug use: No  . Sexual activity: Not on file  Other Topics Concern  . 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 Heart disease  Father        MI 7  . Urticaria Father   . Prostate cancer Brother   . Prostate cancer Paternal Uncle   . Prostate cancer Paternal Uncle   . Prostate cancer Paternal Uncle   . Hyperlipidemia Other   . Stroke Other   . Hypertension Other   . ADD / ADHD Other   . Colon cancer Neg Hx   . Allergic rhinitis Neg Hx   . Asthma Neg Hx   . Eczema Neg Hx    ROS: All systems reviewed and negative except as per HPI.   Current Outpatient Medications  Medication Sig Dispense Refill  . Acetaminophen (TYLENOL PO) Take by mouth.     Marland Kitchen allopurinol (ZYLOPRIM) 100 MG tablet Take 50 mg  by mouth daily.    Marland Kitchen atorvastatin (LIPITOR) 40 MG tablet Take 1 tablet (40 mg total) by mouth daily at 6 PM. 30 tablet 1  . cyanocobalamin (,VITAMIN B-12,) 1000 MCG/ML injection One ml injection intramuscular once a week for 3 weeks and then monthly thereafter. 1 mL 14  . diltiazem (CARDIZEM CD) 120 MG 24 hr capsule Take 1 capsule (120 mg total) by mouth daily. 90 capsule 1  . ELIQUIS 2.5 MG TABS tablet TAKE ONE TABLET TWICE DAILY 60 tablet 5  . escitalopram (LEXAPRO) 10 MG tablet Take 10 mg by mouth daily.    . finasteride (PROSCAR) 5 MG tablet Take 5 mg by mouth daily.    Marland Kitchen FLONASE SENSIMIST 27.5 MCG/SPRAY nasal spray Place 1 spray into the nose daily as needed for rhinitis or allergies.     Marland Kitchen ipratropium (ATROVENT) 0.03 % nasal spray Place 2 sprays into both nostrils 2 (two) times daily.    Marland Kitchen latanoprost (XALATAN) 0.005 % ophthalmic solution Place 1 drop into both eyes at bedtime.     Marland Kitchen linaclotide (LINZESS) 290 MCG CAPS capsule Take 290 mcg by mouth daily before breakfast.    . methylcellulose (CITRUCEL) oral powder Mix 2 grams in 8 ounces of water and drink twice a day. 454 g 0  . metolazone (ZAROXOLYN) 2.5 MG tablet Take every other Wednesday 5 tablet 3  . omeprazole (PRILOSEC) 20 MG capsule Take 20 mg by mouth daily.    . RESTASIS 0.05 % ophthalmic emulsion 1 drop 2 (two) times daily.    . Riociguat (ADEMPAS)  2.5 MG TABS Take 2.5 mg by mouth 3 (three) times daily. 90 tablet 3  . rOPINIRole (REQUIP) 2 MG tablet Take 1 tablet at 9PM while restarting Neupro patch. 30 tablet 0  . rotigotine (NEUPRO) 2 MG/24HR Place 1 patch onto the skin daily. 30 patch 12  . Selexipag 1600 MCG TABS Take 1 tablet (1,600 mcg total) by mouth 2 (two) times daily. 60 tablet 6  . spironolactone (ALDACTONE) 25 MG tablet Take 0.5 tablets (12.5 mg total) by mouth daily. 15 tablet 3  . torsemide (DEMADEX) 20 MG tablet Take 5 tablets (100 mg total) by mouth 2 (two) times daily. 300 tablet 3  . traMADol (ULTRAM) 50 MG tablet Take 50 mg by mouth every 6 (six) hours as needed for moderate pain.     . traZODone (DESYREL) 100 MG tablet Take 100 mg by mouth at bedtime.     No current facility-administered medications for this encounter.   BP 110/68   Pulse 81   Wt 82.7 kg (182 lb 6.4 oz)   SpO2 96%   BMI 28.57 kg/m  General: NAD Neck: JVP 8 cm with HJR, no thyromegaly or thyroid nodule.  Lungs: Dry crackles at lung bases.  CV: Nondisplaced PMI.  Heart regular S1/S2, no S3/S4, no murmur.  2+ ankle edema.  No carotid bruit.  Normal pedal pulses.  Abdomen: Soft, nontender, no hepatosplenomegaly, no distention.  Skin: Intact without lesions or rashes.  Neurologic: Alert and oriented x 3.  Psych: Normal affect. Extremities: No clubbing or cyanosis.  HEENT: Normal.   Assessment/Plan: 1. Chronic diastolic CHF: Suspect with significant component of RV failure from pulmonary hypertension. Echo in 6/20 with EF 60-65%, RV not well-visualized but PASP 59 mmHg.  Echo (3/21) with EF 60-65%, severe LVH, normal RV, PASP 28 mmHg.  PYP scan was not suggestive of TTR cardiac amyloidosis.  NYHA class III symptoms.  6 minute walk significantly  worse today.  Mild volume overload, difficult to balance with CKD stage 3.     - Continue torsemide 100 mg bid, will add metolazone every 2 wks on Wednesday (start tomorrow). BMET/BNP today and in 10 days.      - Continue spironolactone 12.5 mg daily. 2. CKD Stage 3: BMET today. 3. Pulmonary hypertension: Patient thought to have [place] out of proportion to his lung disease (ILD, NSIP versus UIP). RHC in 2/20 showed mean PA pressure 41, PVR 5.1 WU, CI 3. Echo in 6/20 with RV reportedly ok =>I reviewed and do not think that RV was well-visualized. Patient did not tolerate Adcirca due to edema/?cognitive effects.  RHC was done again in 6/20, showed moderate pulmonary arterial hypertension with PVR 5.35 (comparable to prior).  Serological workup for PH was negative. Patient started ambrisentan 5 mg and developed significant edema/volume overload despite a high dose of torsemide. He is now on Uptravi + riociguat.  Most recent echo in 3/21 showed normal RV size/systolic function and PA systolic pressure estimate was normal.  6 minute walk today worse. - he will stay off ambrisentan (?trigger for volume overload).  - As above, he did not tolerate tadalafil.  - Increase Uptravi to 2400 mcg bid as tolerated.  - Continue riociguat at goal dose 2.5 mg tid.  - Echo at followup in 2 months.  4. Atrial fibrillation: Paroxysmal, noted on ILR. NSR today.  - Continue apixaban 2.5 mg bid (dosed for age, renal dysfunction).  5. OSA: He says he will retry CPAP.  6. CVA: In 3/21, may have been atrial fibrillation-related.  On apixaban now.  7. Carotid stenosis: 40-59% RICA stenosis on 3/21 dopplers.  - Repeat carotid dopplers ordered.   8. Interstitial lung disease:  Has seen pulmonary, ILD seems mild and stable.   Followup 2 months with echo.   Loralie Champagne 05/06/2020

## 2020-05-06 NOTE — Telephone Encounter (Signed)
Pt's wife(on DPR) has called to report the last 2 days have been very hard for pt and they would like to move forward in increasing pt's pain patch from 2 mg to 3 mg.  Pt's wife sattes the rOPINIRole (REQUIP) 2 MG tablet.  Is not helping, wife states pt is taking valium and it is helping.

## 2020-05-06 NOTE — Telephone Encounter (Signed)
Pt last seen 04/14/2020. Using 59m neupro patch midmorning as ordered as well as taking the requip 249mat 9pm as recommended.  This has not helped.  Would like to proceed with taking 36m10meupro patch.  Noted as well that valium is helping him sleep.. relayed that you are out today and will address tomorrow.  He appreciated call back.

## 2020-05-06 NOTE — Progress Notes (Signed)
6 Min Walk Test Completed  Pt ambulated 121.9 meters O2 Sat ranged 95-96% on 2L oxygen HR ranged 76-101 Pt. Only able to do 4 minutes,stopped secondary to leg tiredness.

## 2020-05-06 NOTE — Patient Instructions (Signed)
Take Metolazone 2.5 mg every other Wednesday  [facility] titration of your [place]  Labs done today, your results will be available in MyChart, we will contact you for abnormal readings.  Your physician recommends that you return for lab work in: 1-2 weeks  Your physician has requested that you have a carotid duplex. This test is an ultrasound of the carotid arteries in your neck. It looks at blood flow through these arteries that supply the brain with blood. Allow one hour for this exam. There are no restrictions or special instructions.  Your physician recommends that you schedule a follow-up appointment in: 2 months with echocardiogram  If you have any questions or concerns before your next appointment please send [place] a message through [place] or call our office at [phone].    TO LEAVE A MESSAGE FOR THE NURSE SELECT OPTION 2, PLEASE LEAVE A MESSAGE INCLUDING: . YOUR NAME . DATE OF BIRTH . CALL BACK NUMBER . REASON FOR CALL**this is important as we prioritize the call backs  [place] AS LONG AS YOU CALL BEFORE 4:00 PM  At the [facility], you and your health needs are our priority. As part of our continuing mission to provide you with exceptional heart care, we have created designated Provider Care Teams. These Care Teams include your primary Cardiologist (physician) and Advanced Practice Providers (APPs- Physician Assistants and Nurse Practitioners) who all work together to provide you with the care you need, when you need it.   You may see any of the following providers on your designated Care Team at your next follow up: Marland Kitchen Dr Glori Bickers . Dr Loralie Champagne . Dr Vickki Muff . Darrick Grinder, NP . Lyda Jester, [place] . Audry Riles, PharmD   Please be sure to bring in all your medications bottles to every appointment.

## 2020-05-07 ENCOUNTER — Other Ambulatory Visit (HOSPITAL_COMMUNITY): Payer: Self-pay

## 2020-05-08 ENCOUNTER — Telehealth (HOSPITAL_COMMUNITY): Payer: Self-pay | Admitting: Pharmacist

## 2020-05-08 ENCOUNTER — Other Ambulatory Visit (HOSPITAL_COMMUNITY): Payer: Self-pay

## 2020-05-08 MED ORDER — SELEXIPAG 1600 MCG PO TABS: ORAL_TABLET | ORAL | 6 refills | Status: DC

## 2020-05-08 MED ORDER — NEUPRO 3 MG/24HR TD PT24: 3.0000 mg | MEDICATED_PATCH | Freq: Every day | TRANSDERMAL | 11 refills | Status: DC

## 2020-05-08 NOTE — Telephone Encounter (Signed)
I have sent a new order in for the 3 mg Neupro patch.  I do not see valium on his medication list?

## 2020-05-08 NOTE — Addendum Note (Signed)
Addended by: Trudie Buckler on: 05/08/2020 01:06 PM   Modules accepted: Orders

## 2020-05-08 NOTE — Telephone Encounter (Signed)
Received message from Dr. Aundra Dubin than patient will be increasing Uptravi to 2400 mcg BID. He is currently taking Uptravi 1600 mcg BID and will increase by 200 mcg BID every 1-2 weeks as tolerated to target dose of 2400 mcg BID. [facility] and they informed me that a new prior authorization and quantity limit exception were not required. [facility] and provided them with verbal scripts for titration and new dose of Uptravi. [facility] to [facility] to start process of uptitration.    Audry Riles, PharmD, BCPS, BCCP, CPP Heart Failure Clinic Pharmacist [phone]

## 2020-05-08 NOTE — Telephone Encounter (Signed)
I called pt and let him know that MM/NP did increase to 31m neupro patch ([place] [facility]. I asked about valium and he stated he had that from 3 yrs ago from his pcp and it seemed to help when he was having a rough time.  Surprisingly he has been doing better now.  I relayed to use one patch daily.  He appreciated call back.

## 2020-05-09 ENCOUNTER — Other Ambulatory Visit (HOSPITAL_COMMUNITY): Payer: Self-pay | Admitting: Cardiology

## 2020-05-09 ENCOUNTER — Other Ambulatory Visit: Payer: Self-pay

## 2020-05-09 ENCOUNTER — Ambulatory Visit (HOSPITAL_COMMUNITY)
Admission: RE | Admit: 2020-05-09 | Discharge: 2020-05-09 | Disposition: A | Discharge status: Home / Self Care | Payer: Medicare HMO | Source: Ambulatory Visit | Attending: Cardiovascular Disease | Admitting: Cardiovascular Disease

## 2020-05-09 DIAGNOSIS — I6523 Occlusion and stenosis of bilateral carotid arteries: Secondary | ICD-10-CM

## 2020-05-09 NOTE — Telephone Encounter (Signed)
Error

## 2020-05-13 NOTE — Progress Notes (Signed)
Carelink Summary Report / Loop Recorder

## 2020-05-15 ENCOUNTER — Telehealth: Payer: Self-pay | Admitting: Adult Health

## 2020-05-15 ENCOUNTER — Other Ambulatory Visit (HOSPITAL_COMMUNITY): Payer: Self-pay | Admitting: Cardiology

## 2020-05-15 MED ORDER — ROPINIROLE HCL 2 MG PO TABS: ORAL_TABLET | ORAL | 0 refills | Status: DC

## 2020-05-15 NOTE — Telephone Encounter (Signed)
I called him after consulting MM/NP.  Pt needs to increase neupro patch to 173m, ok'd the ropinirole 2273mpo every 8 hours PRN (along with neupro 73m51match). #90 to [facility]. Trying to decrease ropinirole.  Pt verbalized understanding and appreciation.

## 2020-05-15 NOTE — Telephone Encounter (Signed)
LMVM for pt or wife to call back prior to 1200 since MM/NP will be leaving today.  Taking 39m neupro patch along with requip 248mat 2100 last OV.  Need update.

## 2020-05-15 NOTE — Telephone Encounter (Signed)
Pt called back.  His is taking neupro patch 80m daily (using up) along with ropinirole 251mpo every 8 hours.  Tried deliberately to take once at 2100 and had bad night, then also accidentally missed last night and had attack.  Has no more left, taking 33m86mo every 8 hrs and need some called to [facility].  Please advise.

## 2020-05-15 NOTE — Telephone Encounter (Signed)
Pt request refill rOPINIRole (REQUIP) 2 MG tablet at [facility] Pt said taking 3 day, needing prescription for 60 additional tablets.

## 2020-05-16 ENCOUNTER — Other Ambulatory Visit: Payer: Self-pay

## 2020-05-16 ENCOUNTER — Ambulatory Visit (HOSPITAL_COMMUNITY)
Admission: RE | Admit: 2020-05-16 | Discharge: 2020-05-16 | Disposition: A | Discharge status: Home / Self Care | Payer: Medicare HMO | Source: Ambulatory Visit | Attending: Internal Medicine | Admitting: Internal Medicine

## 2020-05-16 DIAGNOSIS — I5032 Chronic diastolic (congestive) heart failure: Secondary | ICD-10-CM | POA: Insufficient documentation

## 2020-05-16 LAB — BASIC METABOLIC PANEL
Anion gap: 10 (ref 5–15)
BUN: 77 mg/dL — ABNORMAL HIGH (ref 8–23)
CO2: 31 mmol/L (ref 22–32)
Calcium: 7.9 mg/dL — ABNORMAL LOW (ref 8.9–10.3)
Chloride: 92 mmol/L — ABNORMAL LOW (ref 98–111)
Creatinine, Ser: 2.15 mg/dL — ABNORMAL HIGH (ref 0.61–1.24)
GFR, Estimated: 29 mL/min — ABNORMAL LOW (ref >60)
Glucose, Bld: 86 mg/dL (ref 70–99)
Potassium: 4.1 mmol/L (ref 3.5–5.1)
Sodium: 133 mmol/L — ABNORMAL LOW (ref 135–145)

## 2020-05-19 IMAGING — DX PORTABLE CHEST - 1 VIEW
1 series · 1 of 1 positions shown · non-contrast
Comparison: Radiographs September 09, 2018.

CLINICAL DATA: Shortness of breath.

EXAM:
PORTABLE CHEST 1 VIEW

[chest ap]
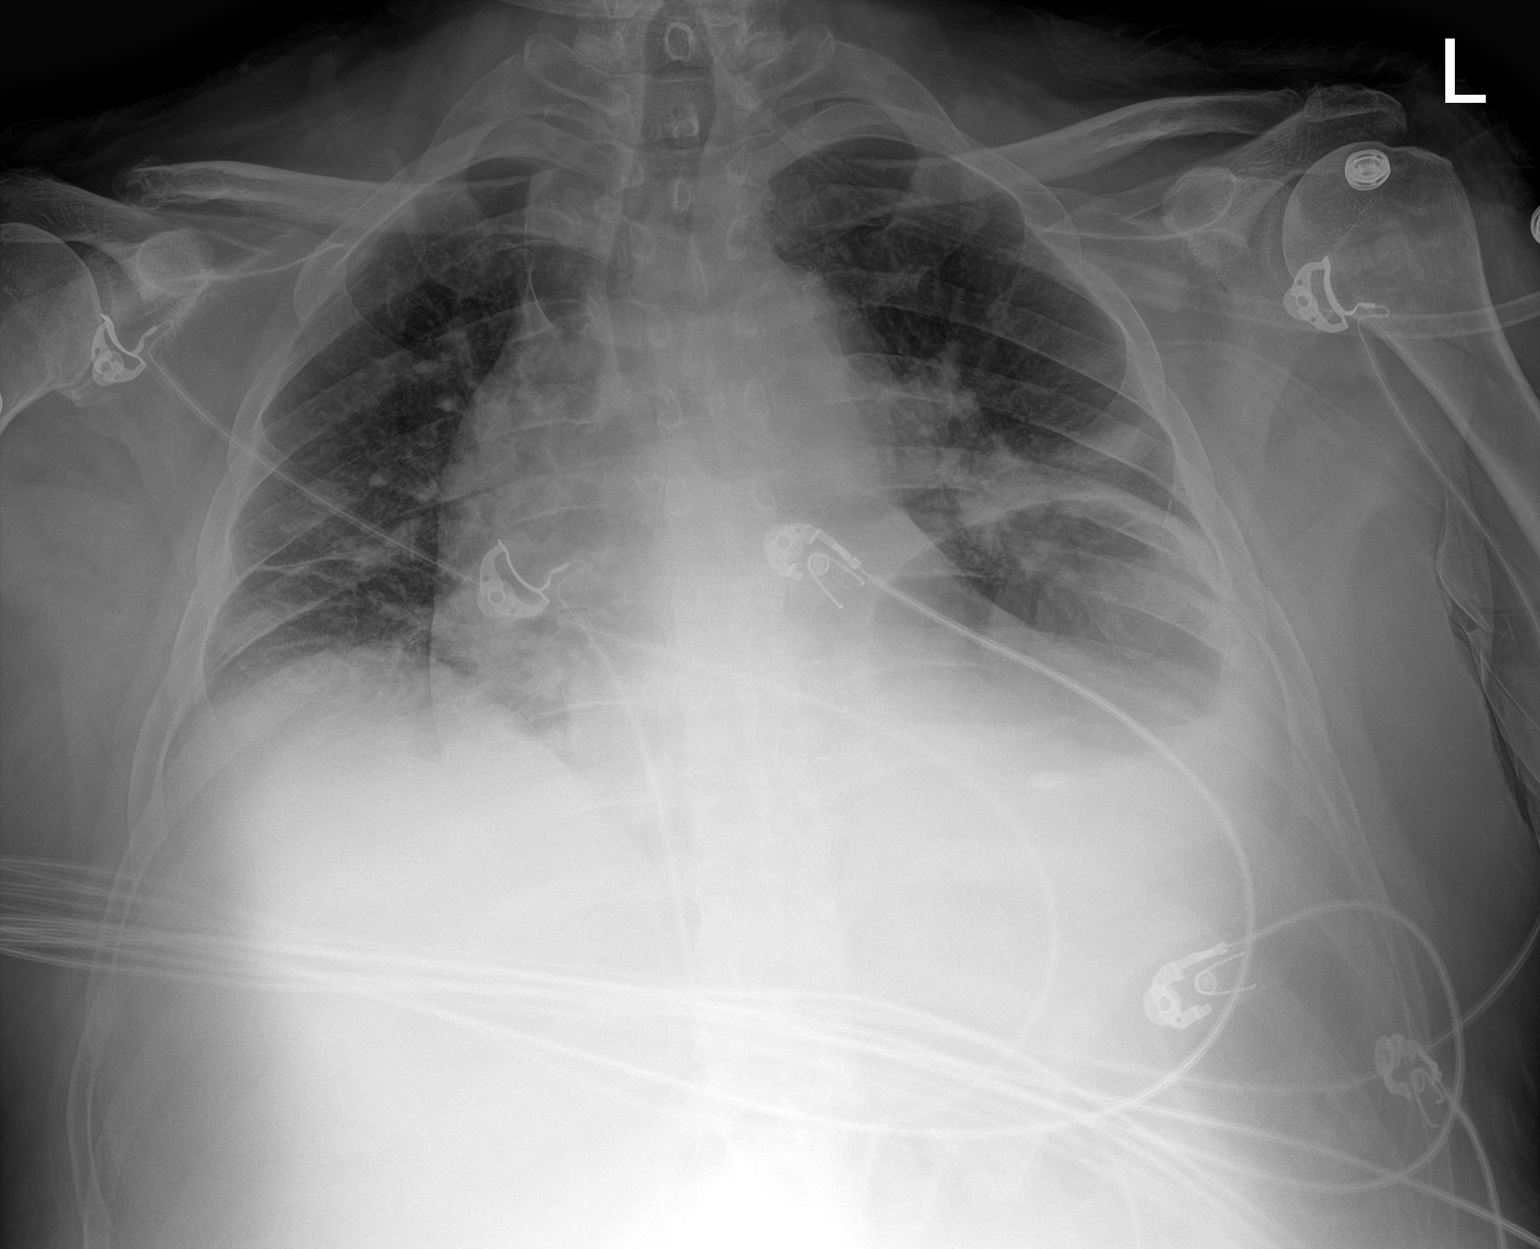

[1 of 1 positions shown; findings below may reference images not displayed]

FINDINGS: Stable cardiomegaly. No pneumothorax is noted. Stable elevated left
hemidiaphragm. Mild bibasilar subsegmental atelectasis is noted. No
significant pleural effusion is noted. Bony thorax is unremarkable.
IMPRESSION: Stable elevated left hemidiaphragm. Mild bibasilar subsegmental
atelectasis.

## 2020-05-22 IMAGING — CR LUMBAR SPINE - 2-3 VIEW
3 series · 3 of 3 positions shown · non-contrast
Comparison: 03/06/2014

CLINICAL DATA: Chronic low back pain

EXAM:
LUMBAR SPINE - 2-3 VIEW

[l-spine ap]
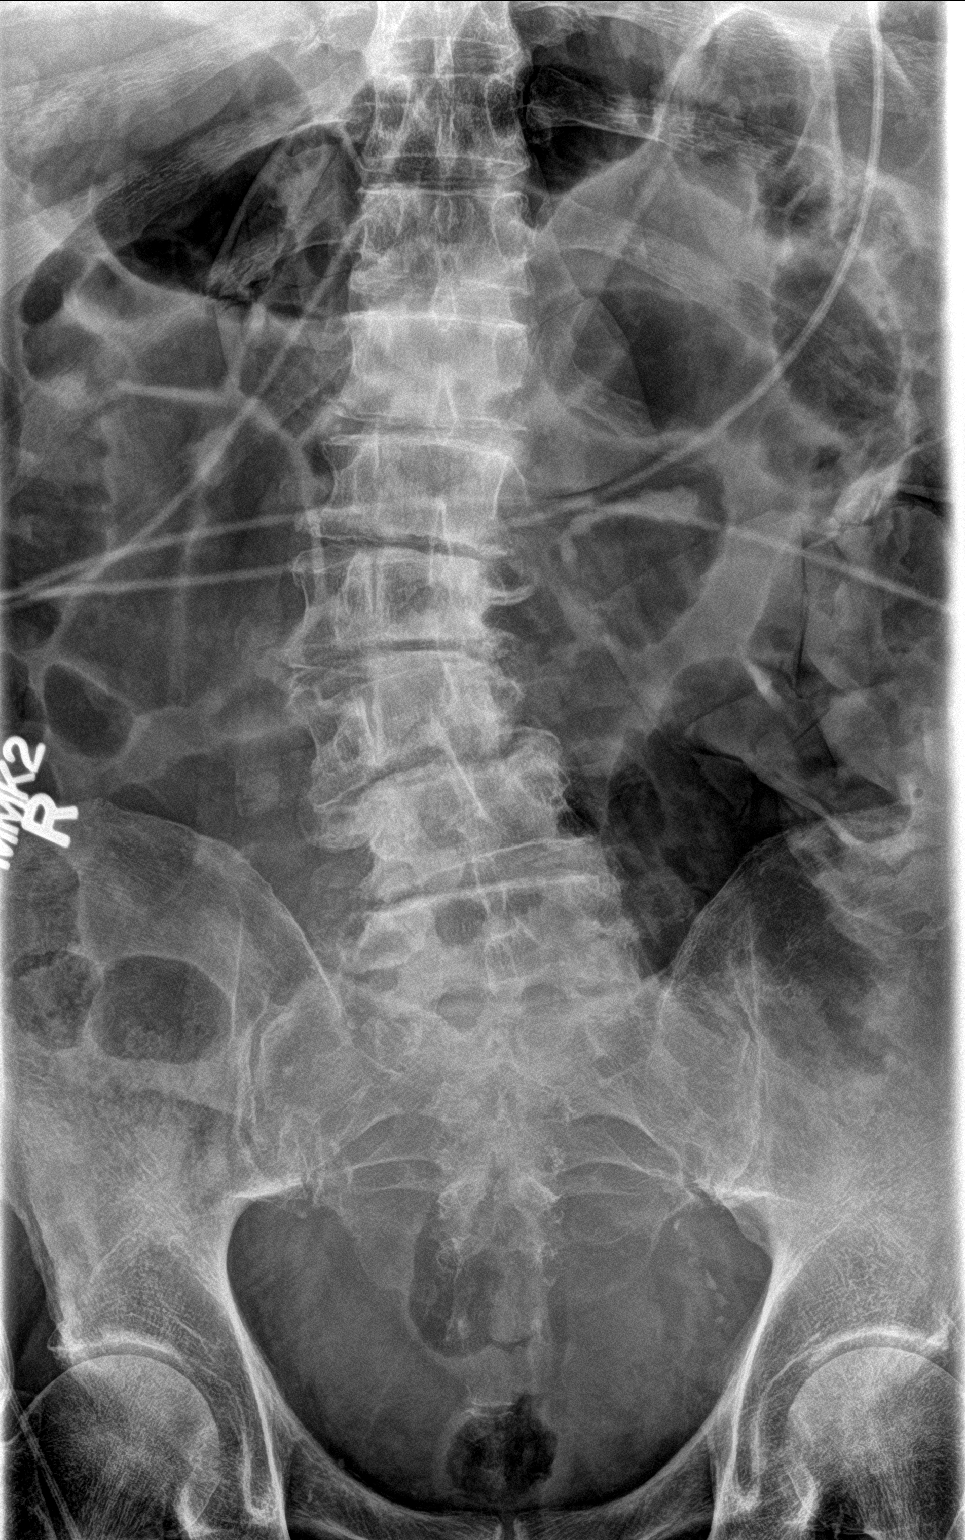

[l-spine lat]
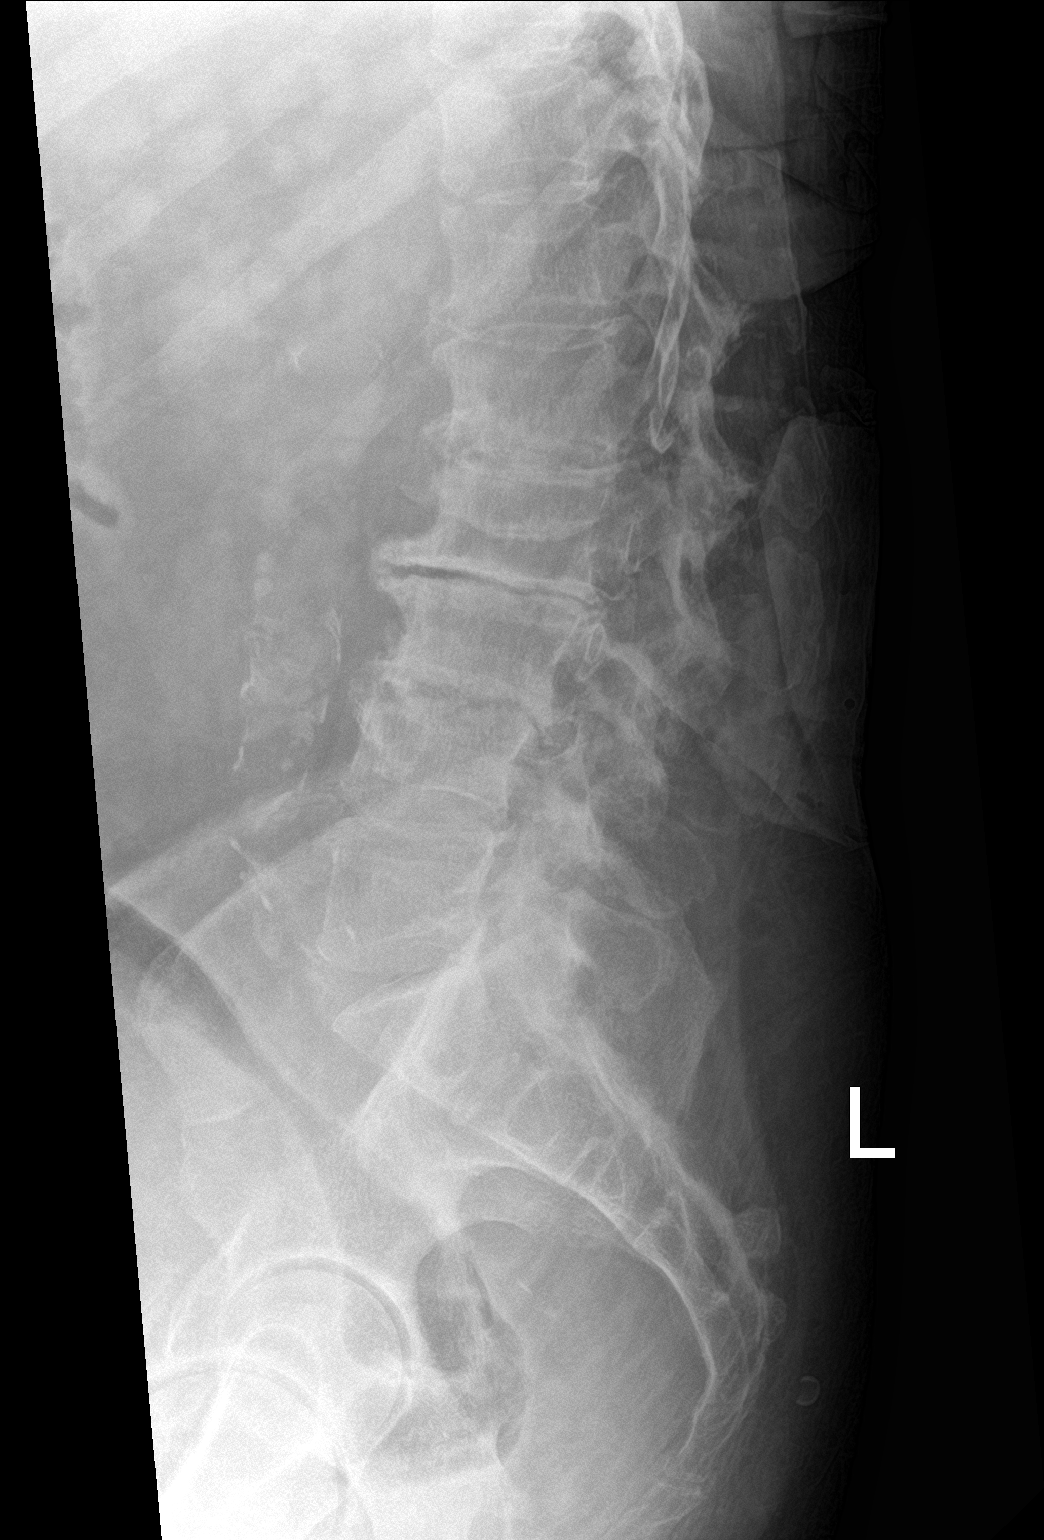

[l-spine spot]
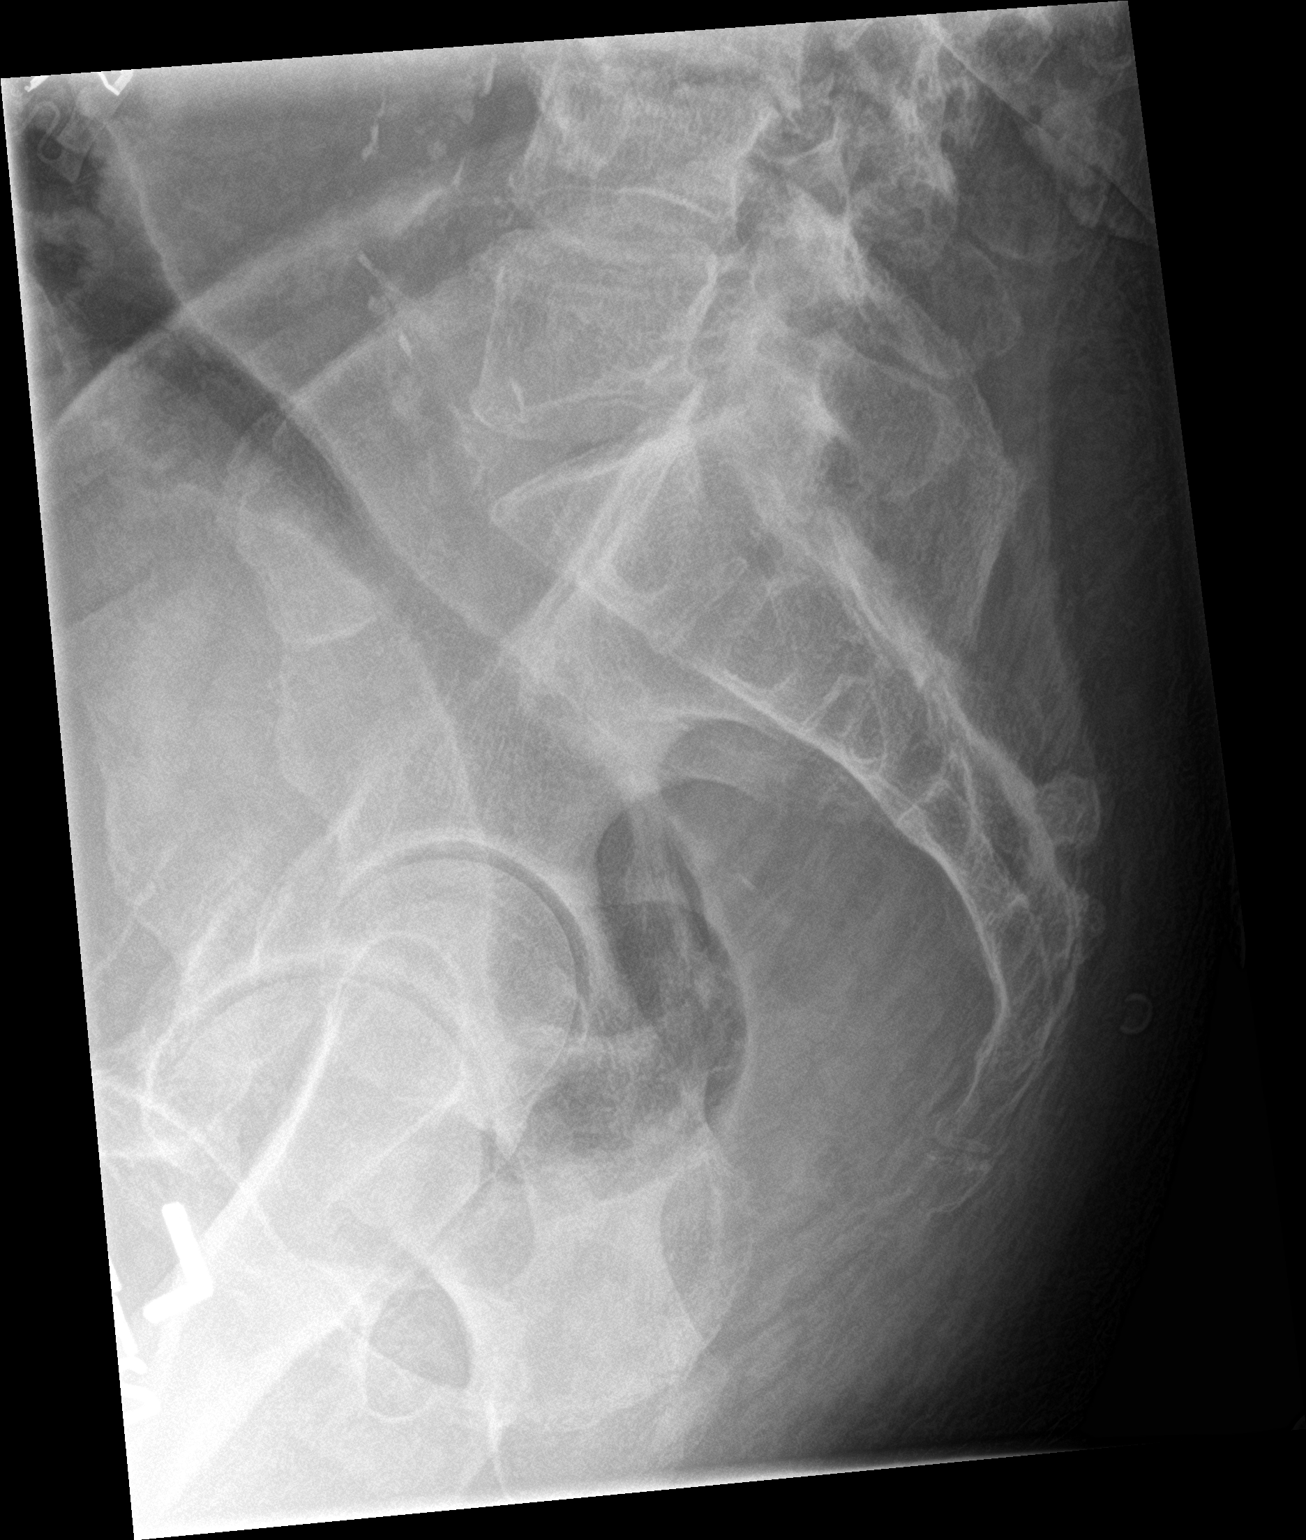

[3 of 3 positions shown; findings below may reference images not displayed]

FINDINGS: Rightward scoliosis centered in the mid lumbar spine. Diffuse
degenerative disc and facet disease, moderate to advanced, most
pronounced at L2-3 related to scoliosis. No fracture or subluxation.
No change since prior study. Aortic atherosclerosis. No aneurysm.
IMPRESSION: Rightward scoliosis. Advanced degenerative disc and facet disease.
No acute bony abnormality. No change since prior study.

## 2020-05-29 ENCOUNTER — Telehealth (HOSPITAL_COMMUNITY): Payer: Self-pay | Admitting: Pharmacy Technician

## 2020-05-29 NOTE — Telephone Encounter (Signed)
[place] and obtained the billing information for the patient's [place]. His grant is active from February - 01/31/21 and is as follows. This information has been provided to [facility]. I had them close out any other grant they had on file for Uptravi and Adempas, as the TAF grant will cover these medications regardless of cost during this time. The representative ensured the TAF grant would be used next month.  TAF billing information  BIN [id number] PCN [id number]  Group [id number] ID [id number]  Called and updated the patient's wife.  Charlann Boxer, CPhT

## 2020-06-02 ENCOUNTER — Ambulatory Visit (INDEPENDENT_AMBULATORY_CARE_PROVIDER_SITE_OTHER): Payer: Medicare HMO

## 2020-06-02 DIAGNOSIS — I639 Cerebral infarction, unspecified: Secondary | ICD-10-CM | POA: Diagnosis not present

## 2020-06-03 LAB — CUP PACEART REMOTE DEVICE CHECK
Date Time Interrogation Session: 20220430230302
Implantable Pulse Generator Implant Date: 20210325

## 2020-06-12 ENCOUNTER — Other Ambulatory Visit: Payer: Self-pay | Admitting: Adult Health

## 2020-06-12 NOTE — Telephone Encounter (Signed)
I called pt and he is taking the ropinirole 60m every 8 hours (sometimes 6-7 hour). Along with the neupro patch 348mdaily.  He has tried and cannot get off this.  He states in good control for most part like he is doing.  May even take additional tablet is an attack.  Increase # of refills.

## 2020-06-16 ENCOUNTER — Telehealth: Payer: Self-pay | Admitting: Adult Health

## 2020-06-16 MED ORDER — ROPINIROLE HCL 0.5 MG PO TABS: 0.5000 mg | ORAL_TABLET | Freq: Two times a day (BID) | ORAL | 1 refills | Status: DC

## 2020-06-16 NOTE — Telephone Encounter (Signed)
I called patient left message for him stating that Jinny Blossom had conferred with 2 doctors about him in relation to this medication.  She also wanted to find out if he had an iron infusion recently.  Because low iron can cause restless leg sx.    Also relayed that you are on too much medication and it can lead to psychosis and  are bringing you back down to where you are taking theNeupro patch 3 mg daily and 0.5 mg twice a day and that is what she she is ordering for you now.   Also give me a call back regarding the iron infusion.

## 2020-06-16 NOTE — Telephone Encounter (Signed)
Pt called, checking my refill request for rOPINIRole (REQUIP) 2 MG tablet. Would like a call from the nurse.

## 2020-06-16 NOTE — Telephone Encounter (Signed)
Pt called, to check on status of rOPINIRole (REQUIP) 2 MG tablet. I am out of medication. Would like a call from the nurse.

## 2020-06-17 NOTE — Telephone Encounter (Signed)
Called and LMVM for [name], wife of pt.  Checking to see that he go message.

## 2020-06-17 NOTE — Telephone Encounter (Signed)
Spoke to wife of pt relayed the message plan for pt as per MM/NP instructions.  She verbalized understanding and will let him know SE (current dosing could lead to psychosis).  I gave her plan ( use 57m neupro patch daily and 0.550mpo bid for breakthru ).  She will let him know.  Has appt on 07-16-20 at 1300 with Dr. AhJaynee Eagles

## 2020-06-18 NOTE — Telephone Encounter (Signed)
Pt called, want to speak with Lovey Newcomer about my medication for restless leg. Would like a call from the nurse.

## 2020-06-19 ENCOUNTER — Other Ambulatory Visit: Payer: Self-pay | Admitting: Neurology

## 2020-06-19 NOTE — Telephone Encounter (Signed)
Teagen, [place] [phone] (his wife #)

## 2020-06-19 NOTE — Telephone Encounter (Addendum)
I called and spoke to wife. Wife states that pt will not answer his phone, is basically blind.   He started taking the 0.5 ropinirole Tuesday night as ordered (0.5 p.o. twice daily (.  Along with the 3 mg Neupro patch.  Which the wife is saying now that he does not stick well has even tried applying 2 patches.  She stated he was utterly exhausted had only stepped for an hour having to be on his feet all the time.  He will be seeing his primary care doctor later.  He has caregivers that come in and check on him every 2 or 3 hours.  He had some cold Valium 2 tablets which had been expired he tried to use those which did not help.  He resides at resides at [facility].  Wife is not there OOT. [facility] pharmacy and do mail order.

## 2020-06-19 NOTE — Telephone Encounter (Signed)
I called. I left a message that patient should stay on his current dose (46m neupro and .529mtid requip) until we see him. We will try to get him seen sooner and [facility]

## 2020-06-19 NOTE — Telephone Encounter (Signed)
Pt called, I have had restless leg for 24 hours, only had a hour of sleep. I can not go on like this.  Pt said do not want to go to the ED. Would like someone to call me.

## 2020-06-20 NOTE — Progress Notes (Signed)
Carelink Summary Report / Loop Recorder

## 2020-06-23 NOTE — Telephone Encounter (Signed)
Happy to see the patient. I am sure Dr. Jaynee Eagles could step in if I needed her.

## 2020-06-23 NOTE — Telephone Encounter (Signed)
I called and LMVM for pts wife to return call about earlier appt with MM/NP, f/u on Dr. Cathren Laine message.    I did not have any opening earlier with Dr. Jaynee Eagles.

## 2020-06-23 NOTE — Telephone Encounter (Addendum)
Wife called back she did get the message form Dr. Jaynee Eagles.  Appreciated this.  She stated pt had gotten sleep and when she saw him Friday after being OOT , he was better.  I mentioned about appt with Dr. Jaynee Eagles was not available as yet this week, wife ok to see MM/NP for f/u Wednesday at 1330 (Dr. Jaynee Eagles possible step in)??  If not that is ok (she expressed liking too meet all his doctors).  She liked [place].NP.  She asked if MM/NP could make referrals and I relayed yes she could.  Will let her know if step in was a possibility?  Wanted to note as well, that pt was using 2 patches last Wednesday (stated was not sticking).

## 2020-06-24 NOTE — Telephone Encounter (Signed)
Called wife confirmed appt MMNP  happy to see patient,   Wife appreciated call.

## 2020-06-25 ENCOUNTER — Other Ambulatory Visit: Payer: Self-pay

## 2020-06-25 ENCOUNTER — Encounter: Payer: Self-pay | Admitting: Adult Health

## 2020-06-25 ENCOUNTER — Ambulatory Visit: Payer: Medicare HMO | Admitting: Adult Health

## 2020-06-25 VITALS — BP 119/61 | HR 77 | Ht 67.0 in | Wt 184.0 lb

## 2020-06-25 DIAGNOSIS — G2581 Restless legs syndrome: Secondary | ICD-10-CM

## 2020-06-25 DIAGNOSIS — E611 Iron deficiency: Secondary | ICD-10-CM | POA: Diagnosis not present

## 2020-06-25 MED ORDER — ROPINIROLE HCL 2 MG PO TABS: 2.0000 mg | ORAL_TABLET | Freq: Three times a day (TID) | ORAL | 5 refills | Status: DC

## 2020-06-25 NOTE — Patient Instructions (Signed)
Your Plan:  Stop Neupro Patch Increase Requip to 2 mg three times a day Blood work today- if iron levels remain low then will order iron infusion If your symptoms worsen or you develop new symptoms please let us know.   Thank you for coming to see [place] at [facility]. I hope we have been able to provide you high quality care today.  You may receive a patient satisfaction survey over the next few weeks. We would appreciate your feedback and comments so that we may continue to improve ourselves and the health of our patients.

## 2020-06-25 NOTE — Progress Notes (Addendum)
PATIENT: Ryan [name] DOB: 1931-03-22  REASON FOR VISIT: follow up HISTORY FROM: patient  HISTORY OF PRESENT ILLNESS: 06/25/20   Ryan [name] is an 87 year old male with a history of restless leg syndrome and chronic back pain.  He returns today for follow-up.  He is currently on Neupro 3 mg patch.  He was originally told to only take Requip 2 mg twice a day for breakthrough pain.  The patient was taking Requip 2 mg 3 times a day in addition to the Neupro patch.  Patient was advised to a telephone conversation that this was too much medication.  His dose of Requip was reduced to 0.5 mg twice a day.  He remains on Neupro 3 mg patch.  The patient does not feel that Neupro has offered him much benefit.  He would like to go back to Requip.  The patient's iron level and ferritin level was low in April.  Lab work was completed by Dr. Claris Gladden office.  According to notes the patient was placed to have an iron infusion however the patient states that he was not aware of this and has been taking oral iron supplements.  Patient has a history of chronic back pain with previous surgery through Dr. Clarice Pole office.  He also reports that he was receiving injections with Dr. Ellene Route but did not find them beneficial.  He has not followed up with Dr. Ellene Route.  He reports that he has approximately 2 restless leg attacks during the day or night.  He states that his legs feel jumpy and painful.  He also reports that during this time he notices some back pain.  Usually a hot shower offers him some benefit.  Reports that he is not sleeping well at night.  Returns today for an evaluation.  04/14/20: Ryan [name] is an 87 year old male with a history of restless leg syndrome and chronic back pain.  He returns today for follow-up.  He reports that he has not been taking the Neupro consistently.  Reports early on he would only place the patch on if he felt that he needed it.  He states for the last week he has been  placing it consistently.  He takes Requip 2 mg up to 3 times a day.  He states that typically between 10 and 11 PM his symptoms will begin.  He describes it as a pins and needle sensation in the legs.  He states the hot shower used to help but it no longer does.  He states that the sensation is usually stop by 1 AM.  In the past he has been on gabapentin but was unable to tolerate it.  Patient is currently being monitored for his kidney function.  12/10/19: Ryan [name] is an 87 year old male with a history of restless leg syndrome and chronic back pain.  He returns today for follow-up.  The patient has been taking Requip and gabapentin but reports it does not offer him much benefit for his restless legs.  He states he can be up 3-4 times at night.  He also has symptoms throughout the day.  He states that gabapentin makes him very drowsy.  He has not taken it for the last 2 nights.  His B12 levels were low back in April.  He has been taking monthly B12 injections but has not had a shot in at least 2 months.  He returns today for an evaluation.   HISTORY 10/18/19:  Ryan [name] is an 87 year old male with  a history of restless leg syndrome, CVA and chronic back pain.  He returns today to discuss restless legs.  He states that he is currently taking Requip 2 mg 3 times a day.  He states that he tries to take them 8 hours apart.  He states that he has been waking up during the night with symptoms.  He states that this can occur anytime between 10 and 12 and sometimes will last till 4 in the morning.  He states that a hot shower is sometimes beneficial.  Initially tramadol offered him the most benefit but it no longer works.  He states that he does not have any symptoms during the day.  He returns today for an evaluation.  REVIEW OF SYSTEMS: Out of a complete 14 system review of symptoms, the patient complains only of the following symptoms, and all other reviewed systems are negative.  See  HPI  ALLERGIES: Allergies  Allergen Reactions  . Pollen Extract-Tree Extract Other (See Comments)    HEADACHES, TIRED , DRAINAGE FROM SINUSES  . Molds & Smuts Other (See Comments)    Also dust mites causes sinus infections, h/a etc.    HOME MEDICATIONS: Outpatient Medications Prior to Visit  Medication Sig Dispense Refill  . Acetaminophen (TYLENOL PO) Take by mouth.     Marland Kitchen allopurinol (ZYLOPRIM) 100 MG tablet Take 50 mg by mouth daily.    Marland Kitchen atorvastatin (LIPITOR) 40 MG tablet Take 1 tablet (40 mg total) by mouth daily at 6 PM. 30 tablet 1  . cyanocobalamin (,VITAMIN B-12,) 1000 MCG/ML injection One ml injection intramuscular once a week for 3 weeks and then monthly thereafter. 1 mL 14  . diltiazem (CARDIZEM CD) 120 MG 24 hr capsule Take 1 capsule (120 mg total) by mouth daily. 90 capsule 1  . ELIQUIS 2.5 MG TABS tablet TAKE ONE TABLET TWICE DAILY 60 tablet 5  . escitalopram (LEXAPRO) 10 MG tablet Take 10 mg by mouth daily.    . finasteride (PROSCAR) 5 MG tablet Take 5 mg by mouth daily.    Marland Kitchen FLONASE SENSIMIST 27.5 MCG/SPRAY nasal spray Place 1 spray into the nose daily as needed for rhinitis or allergies.     Marland Kitchen ipratropium (ATROVENT) 0.03 % nasal spray Place 2 sprays into both nostrils 2 (two) times daily.    Marland Kitchen latanoprost (XALATAN) 0.005 % ophthalmic solution Place 1 drop into both eyes at bedtime.     Marland Kitchen linaclotide (LINZESS) 290 MCG CAPS capsule Take 290 mcg by mouth daily before breakfast.    . methylcellulose (CITRUCEL) oral powder Mix 2 grams in 8 ounces of water and drink twice a day. 454 g 0  . metolazone (ZAROXOLYN) 2.5 MG tablet Take every other Wednesday 5 tablet 3  . omeprazole (PRILOSEC) 20 MG capsule Take 20 mg by mouth daily.    . RESTASIS 0.05 % ophthalmic emulsion 1 drop 2 (two) times daily.    . Riociguat (ADEMPAS) 2.5 MG TABS Take 2.5 mg by mouth 3 (three) times daily. 90 tablet 3  . Selexipag 1600 MCG TABS Start at 1600 mcg BID. Increase by 200 mcg BID every 1-2 weeks  to new target dose of 2400 mcg BID. 120 tablet 6  . spironolactone (ALDACTONE) 25 MG tablet Take 0.5 tablets (12.5 mg total) by mouth daily. 15 tablet 3  . torsemide (DEMADEX) 20 MG tablet TAKE 5 TABLETS TWICE DAILY 300 tablet 3  . traMADol (ULTRAM) 50 MG tablet Take 50 mg by mouth every 6 (six) hours  as needed for moderate pain.     . traZODone (DESYREL) 100 MG tablet Take 100 mg by mouth at bedtime.    Marland Kitchen rOPINIRole (REQUIP) 0.5 MG tablet Take 1 tablet (0.5 mg total) by mouth 2 (two) times daily. For breakthru pain. 60 tablet 1  . Rotigotine (NEUPRO) 3 MG/24HR PT24 Place 3 mg onto the skin daily. 30 patch 11   No facility-administered medications prior to visit.    PAST MEDICAL HISTORY: Past Medical History:  Diagnosis Date  . Asthma   . Barrett's esophagus   . Benign prostatic hypertrophy   . Bilateral lower extremity edema   . CHF (congestive heart failure) ([place])   . Chronic fatigue   . COPD (chronic obstructive pulmonary disease) ([place])   . Diabetes mellitus type 2 in nonobese ([place]) 04/24/2019  . Diverticulosis of colon (without mention of hemorrhage)   . Esophageal reflux   . Gastritis   . Gluten intolerance   . Hiatal hernia 02/10/11   Noted on CT Scan - Moderate Hiatal Hernia  . Hyperlipemia   . Hypertension   . 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 Melanoma ([place])    lymphoma  . Microcytic anemia 04/20/2011   . 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 Osteoarthritis    hands/feet,knees, NECK, BACK  . Periaortic lymphadenopathy 02/16/2011  . Personal history of colonic polyps 2004   hyperplastic Dr. Collene Mares  . Pulmonary hyperinflation   . Renal cyst 02/10/11    Noted on CT Scan - Bilateral Renal Cysts  . S/P radiation therapy 03/15/11 - 04/09/11   Abdominal/ Pelvic Tumor, 3600 cGy/20 Fractions    PAST SURGICAL HISTORY: Past Surgical History:  Procedure Laterality Date   . ARTHROSCOPIC REPAIR ACL     right  . BONE MARROW ASPIRATION  02/25/11   Bone Marrow, Aspirate, Clot, and Bilateral Bx, Right PIC  . CATARACT EXTRACTION, BILATERAL    . EYE SURGERY    . HERNIA REPAIR     LEFT INGUINAL   . INSERTION OF MESH N/A 08/23/2012   Procedure: INSERTION OF MESH;  Surgeon: Madilyn Hook, DO;  Location: [facility];  Service: General;  Laterality: N/A;  . LOOP RECORDER INSERTION N/A 04/26/2019   Procedure: LOOP RECORDER INSERTION;  Surgeon: Evans Lance, MD;  Location: [facility] [place] [facility];  Service: Cardiovascular;  Laterality: N/A;  . LUMBAR LAMINECTOMY/DECOMPRESSION MICRODISCECTOMY Right 12/31/2013   Procedure: RIGHT L4-5 L5-S1 LAMINECTOMY;  Surgeon: Kristeen Miss, MD;  Location: [place] [facility];  Service: Neurosurgery;  Laterality: Right;  RIGHT L4-5 L5-S1 LAMINECTOMY  . RIGHT HEART CATH N/A 07/25/2018   Procedure: RIGHT HEART CATH;  Surgeon: Larey Dresser, MD;  Location: [place] [facility];  Service: Cardiovascular;  Laterality: N/A;  . ROTATOR CUFF REPAIR     right  . TONSILLECTOMY    . TONSILLECTOMY    . VENTRAL HERNIA REPAIR N/A 08/23/2012   Procedure: HERNIA REPAIR VENTRAL ADULT;  Surgeon: Madilyn Hook, DO;  Location: [facility];  Service: General;  Laterality: N/A;    FAMILY HISTORY: Family History  Problem Relation Age of Onset  . Heart disease Father        MI 43  . Urticaria Father   . Prostate cancer Brother   . Prostate cancer Paternal Uncle   . Prostate cancer Paternal Uncle   . Prostate cancer Paternal Uncle   . Hyperlipidemia  Other   . Stroke Other   . Hypertension Other   . ADD / ADHD Other   . Colon cancer Neg Hx   . Allergic rhinitis Neg Hx   . Asthma Neg Hx   . Eczema Neg Hx     SOCIAL HISTORY: Social History   Socioeconomic History  . Marital status: Married    Spouse name: Not on file  . Number of children: 2  . Years of education: Not on file  . Highest education level: Not on file  Occupational History  . 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 Smoking status: Former Smoker    Packs/day: 1.00    Years: 35.00    Pack years: 35.00    Types: Pipe, Cigarettes    Quit date: 02/23/1992    Years since quitting: 28.3  . Smokeless tobacco: Never Used  . Tobacco comment: quit 20 years ago  Vaping Use  . Vaping Use: Never used  Substance and Sexual Activity  . Alcohol use: Not Currently    Comment: 2 drinks daily scotch  AND WINE WITH SUPPER  . Drug use: No  . Sexual activity: Not on file  Other Topics Concern  . Not on file  Social History Narrative   Married - second marriage   He has two children   Retired Horticulturist, commercial Professor   Currently teaches pottery making, 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[facility]   Former Smoker quit 12 years ago- smoked for 35 years   Alcohol use- not currently   Right handed   Social Determinants of Radio broadcast assistant Strain: Not on file  Food Insecurity: Not on file  Transportation Needs: Not on file  Physical Activity: Not on file  Stress: Not on file  Social Connections: Not on file  Intimate Partner Violence: Not on file      PHYSICAL EXAM  Vitals:   06/25/20 1328  BP: 119/61  Pulse: 77  SpO2: 93%  Weight: 184 lb (83.5 kg)  Height: _0  (1.702 m)   Body mass index is 28.82 kg/m.  Generalized: Well developed, in no acute distress   Neurological examination  Mentation: Alert oriented to time, place, history taking. Follows all commands speech and language fluent Cranial nerve II-XII: Pupils were equal round reactive to light. Extraocular movements were full, visual field were full on confrontational test.  Head turning and shoulder shrug  were normal and symmetric. Motor: The motor testing reveals 5 over 5 strength of all 4 extremities. Good symmetric motor tone is noted throughout. 4+ pitting edema bilateral lower extremities Sensory: Sensory testing is intact to soft touch on all 4 extremities. No evidence of  extinction is noted.  Coordination: Cerebellar testing reveals good finger-nose-finger and heel-to-shin bilaterally.  Gait and station: Uses a Rollator when ambulating. Reflexes: Deep tendon reflexes are symmetric and normal bilaterally.   DIAGNOSTIC DATA (LABS, IMAGING, TESTING) - I reviewed patient records, labs, notes, testing and imaging myself where available.  Lab Results  Component Value Date   WBC 7.5 12/10/2019   HGB 9.7 (L) 12/10/2019   HCT 33.2 (L) 12/10/2019   MCV 85 12/10/2019   PLT 328 12/10/2019      Component Value Date/Time   NA 133 (L) 05/16/2020 1051   NA 139 12/10/2019 1007   NA 143 06/26/2015 0916   K 4.1 05/16/2020 1051   K 3.8 06/26/2015 0916   CL 92 (L) 05/16/2020 1051  CL 105 04/07/2012 0925   CO2 31 05/16/2020 1051   CO2 32 (H) 06/26/2015 0916   GLUCOSE 86 05/16/2020 1051   GLUCOSE 102 06/26/2015 0916   GLUCOSE 86 04/07/2012 0925   BUN 77 (H) 05/16/2020 1051   BUN 56 (H) 12/10/2019 1007   BUN 14.0 06/26/2015 0916   CREATININE 2.15 (H) 05/16/2020 1051   CREATININE 1.0 06/26/2015 0916   CALCIUM 7.9 (L) 05/16/2020 1051   CALCIUM 8.9 06/26/2015 0916   PROT 6.6 12/10/2019 1007   PROT 6.2 (L) 06/26/2015 0916   ALBUMIN 4.4 12/10/2019 1007   ALBUMIN 3.5 06/26/2015 0916   AST 10 12/10/2019 1007   AST 17 06/26/2015 0916   ALT 10 12/10/2019 1007   ALT 15 06/26/2015 0916   ALKPHOS 100 12/10/2019 1007   ALKPHOS 87 06/26/2015 0916   BILITOT <0.2 12/10/2019 1007   BILITOT 0.50 06/26/2015 0916   GFRNONAA 29 (L) 05/16/2020 1051   GFRAA 32 (L) 12/10/2019 1007   Lab Results  Component Value Date   CHOL 159 09/24/2019   HDL 45 09/24/2019   LDLCALC 82 09/24/2019   LDLDIRECT 165.7 12/23/2008   TRIG 159 (H) 09/24/2019   CHOLHDL 3.5 09/24/2019   Lab Results  Component Value Date   HGBA1C 5.9 (H) 04/25/2019   Lab Results  Component Value Date   VITAMINB12 817 12/10/2019   Lab Results  Component Value Date   TSH 1.444 06/11/2011       ASSESSMENT AND PLAN 87 y.o. year old male  has a past medical history of Asthma, Barrett's esophagus, Benign prostatic hypertrophy, Bilateral lower extremity edema, CHF (congestive heart failure) ([place]), Chronic fatigue, COPD (chronic obstructive pulmonary disease) ([place]), Diabetes mellitus type 2 in nonobese ([place]) (04/24/2019), Diverticulosis of colon (without mention of hemorrhage), Esophageal reflux, Gastritis, Gluten intolerance, Hiatal hernia (02/10/11), Hyperlipemia, Hypertension, Lymphoma of lymph nodes in pelvis ([place]) (03/03/2011), Melanoma ([place]), Microcytic anemia (04/20/2011 ), NHL (non-Hodgkin's lymphoma) ([place]), Osteoarthritis, Periaortic lymphadenopathy (02/16/2011), Personal history of colonic polyps (2004), Pulmonary hyperinflation, Renal cyst (02/10/11), and S/P radiation therapy (03/15/11 - 04/09/11). here with:  1.  Restless leg syndrome  -Stop Neupro patch -Start Requip 2 mg 3 times a day -Recheck iron and ferritin levels today.  If they remain low we will set the patient up for iron infusion. -If the iron infusion and Requip does not offer him any benefit with his symptoms we will refer him to academic center for evaluation for restless leg syndrome -Advised if symptoms do not improve he should let us know -Follow-up in 3 to 4 months or sooner if needed   I spent 30 minutes of face-to-face and non-face-to-face time with patient.  This included previsit chart review, discussion of iron infusion and lab work and potential referral to academic center  Ward Givens, MSN, NP-C 06/25/2020, 3:12 PM [facility] [address] [place],  [address] [phone]  Made any corrections needed, and agree with history, physical, neuro exam,assessment and plan as stated.     Sarina Ill, MD [facility]

## 2020-06-26 LAB — IRON AND TIBC
Iron Saturation: 17 % (ref 15–55)
Iron: 76 ug/dL (ref 38–169)
Total Iron Binding Capacity: 436 ug/dL (ref 250–450)
UIBC: 360 ug/dL — ABNORMAL HIGH (ref 111–343)

## 2020-06-26 LAB — FERRITIN: Ferritin: 20 ng/mL — ABNORMAL LOW (ref 30–400)

## 2020-07-01 ENCOUNTER — Other Ambulatory Visit (HOSPITAL_COMMUNITY): Payer: Self-pay | Admitting: *Deleted

## 2020-07-01 MED ORDER — ADEMPAS 2.5 MG PO TABS: 2.5000 mg | ORAL_TABLET | Freq: Three times a day (TID) | ORAL | 3 refills | Status: DC

## 2020-07-03 ENCOUNTER — Telehealth: Payer: Self-pay | Admitting: *Deleted

## 2020-07-03 NOTE — Telephone Encounter (Signed)
-----  Message from Ward Givens, NP sent at 07/01/2020  5:12 PM EDT ----- Iron level has improved with oral supplement. Ferritin is still a little low but has improved. Please ask how he is doing with just the requip? If he is still have significant discomfort then we will refer to academic center

## 2020-07-03 NOTE — Telephone Encounter (Signed)
I called wife patient. I relayed the lab results that his iron did come up from 23 to 76 as well as his ferritin level.  He seems to be doing better.  She seems to say 20 to 25% better.  The Requip she feels like is a better thing for him. He is taking 2 - 3 just depends/ per day.  He is on oxygen all the time now for congestive heart failure.  She prefers really not to take him to [facility] if at all possible but if that is something that is recommended and after speaking with patient, that she will let us know or if things seem to get worse that can definitely do that.  She was appreciative of call and verbalized understanding of results.

## 2020-07-06 LAB — CUP PACEART REMOTE DEVICE CHECK
Date Time Interrogation Session: 20220602230736
Implantable Pulse Generator Implant Date: 20210325

## 2020-07-07 ENCOUNTER — Ambulatory Visit (INDEPENDENT_AMBULATORY_CARE_PROVIDER_SITE_OTHER): Payer: Medicare HMO

## 2020-07-07 DIAGNOSIS — I639 Cerebral infarction, unspecified: Secondary | ICD-10-CM

## 2020-07-08 ENCOUNTER — Encounter (HOSPITAL_COMMUNITY): Payer: Self-pay | Admitting: Cardiology

## 2020-07-08 ENCOUNTER — Ambulatory Visit (HOSPITAL_BASED_OUTPATIENT_CLINIC_OR_DEPARTMENT_OTHER)
Admission: RE | Admit: 2020-07-08 | Discharge: 2020-07-08 | Disposition: A | Discharge status: Home / Self Care | Payer: Medicare HMO | Source: Ambulatory Visit | Attending: Cardiology | Admitting: Cardiology

## 2020-07-08 ENCOUNTER — Ambulatory Visit (HOSPITAL_COMMUNITY)
Admission: RE | Admit: 2020-07-08 | Discharge: 2020-07-08 | Disposition: A | Discharge status: Home / Self Care | Payer: Medicare HMO | Source: Ambulatory Visit | Attending: Cardiology | Admitting: Cardiology

## 2020-07-08 ENCOUNTER — Other Ambulatory Visit: Payer: Self-pay

## 2020-07-08 VITALS — BP 134/77 | HR 83 | Wt 176.2 lb

## 2020-07-08 DIAGNOSIS — I272 Pulmonary hypertension, unspecified: Secondary | ICD-10-CM

## 2020-07-08 DIAGNOSIS — J8410 Pulmonary fibrosis, unspecified: Secondary | ICD-10-CM | POA: Insufficient documentation

## 2020-07-08 DIAGNOSIS — Z7901 Long term (current) use of anticoagulants: Secondary | ICD-10-CM | POA: Insufficient documentation

## 2020-07-08 DIAGNOSIS — Z8673 Personal history of transient ischemic attack (TIA), and cerebral infarction without residual deficits: Secondary | ICD-10-CM | POA: Diagnosis not present

## 2020-07-08 DIAGNOSIS — I480 Paroxysmal atrial fibrillation: Secondary | ICD-10-CM | POA: Insufficient documentation

## 2020-07-08 DIAGNOSIS — Z8249 Family history of ischemic heart disease and other diseases of the circulatory system: Secondary | ICD-10-CM | POA: Diagnosis not present

## 2020-07-08 DIAGNOSIS — Z87891 Personal history of nicotine dependence: Secondary | ICD-10-CM | POA: Insufficient documentation

## 2020-07-08 DIAGNOSIS — I2721 Secondary pulmonary arterial hypertension: Secondary | ICD-10-CM | POA: Diagnosis not present

## 2020-07-08 DIAGNOSIS — Z79899 Other long term (current) drug therapy: Secondary | ICD-10-CM | POA: Diagnosis not present

## 2020-07-08 DIAGNOSIS — I5032 Chronic diastolic (congestive) heart failure: Secondary | ICD-10-CM | POA: Insufficient documentation

## 2020-07-08 DIAGNOSIS — N183 Chronic kidney disease, stage 3 unspecified: Secondary | ICD-10-CM | POA: Diagnosis not present

## 2020-07-08 DIAGNOSIS — E785 Hyperlipidemia, unspecified: Secondary | ICD-10-CM | POA: Insufficient documentation

## 2020-07-08 DIAGNOSIS — G4733 Obstructive sleep apnea (adult) (pediatric): Secondary | ICD-10-CM | POA: Insufficient documentation

## 2020-07-08 DIAGNOSIS — I4510 Unspecified right bundle-branch block: Secondary | ICD-10-CM | POA: Insufficient documentation

## 2020-07-08 DIAGNOSIS — I130 Hypertensive heart and chronic kidney disease with heart failure and stage 1 through stage 4 chronic kidney disease, or unspecified chronic kidney disease: Secondary | ICD-10-CM | POA: Diagnosis not present

## 2020-07-08 DIAGNOSIS — I6521 Occlusion and stenosis of right carotid artery: Secondary | ICD-10-CM | POA: Diagnosis not present

## 2020-07-08 DIAGNOSIS — I498 Other specified cardiac arrhythmias: Secondary | ICD-10-CM | POA: Diagnosis not present

## 2020-07-08 DIAGNOSIS — Z9981 Dependence on supplemental oxygen: Secondary | ICD-10-CM | POA: Insufficient documentation

## 2020-07-08 LAB — ECHOCARDIOGRAM COMPLETE
AR max vel: 3.23 cm2
AV Area VTI: 3.16 cm2
AV Area mean vel: 2.81 cm2
AV Mean grad: 9 mmHg
AV Peak grad: 16.3 mmHg
Ao pk vel: 2.02 m/s
Area-P 1/2: 3.4 cm2
S' Lateral: 2.5 cm

## 2020-07-08 LAB — BRAIN NATRIURETIC PEPTIDE: B Natriuretic Peptide: 56.9 pg/mL (ref 0.0–100.0)

## 2020-07-08 LAB — BASIC METABOLIC PANEL
Anion gap: 10 (ref 5–15)
BUN: 80 mg/dL — ABNORMAL HIGH (ref 8–23)
CO2: 33 mmol/L — ABNORMAL HIGH (ref 22–32)
Calcium: 8.2 mg/dL — ABNORMAL LOW (ref 8.9–10.3)
Chloride: 91 mmol/L — ABNORMAL LOW (ref 98–111)
Creatinine, Ser: 1.88 mg/dL — ABNORMAL HIGH (ref 0.61–1.24)
GFR, Estimated: 34 mL/min — ABNORMAL LOW (ref >60)
Glucose, Bld: 103 mg/dL — ABNORMAL HIGH (ref 70–99)
Potassium: 3.6 mmol/L (ref 3.5–5.1)
Sodium: 134 mmol/L — ABNORMAL LOW (ref 135–145)

## 2020-07-08 NOTE — Progress Notes (Signed)
  Echocardiogram 2D Echocardiogram has been performed.  Ryan [name] F 07/08/2020, 11:41 AM

## 2020-07-08 NOTE — Progress Notes (Signed)
PCP: Roetta Sessions, NP Cardiology: Dr. Aundra Dubin  87 y.o. with history of interstitial lung disease (?NSIP vs UIP) on home oxygen, pulmonary hypertension, diastolic CHF, and prior non-Hodgkins lymphoma (pelvic mass, s/p XRT) presents for followup of CHF and pulmonary hypertension.  Patient has history of reportedly mild ILD (NSIP vs UIP) followed by a pulmonologist at [facility] and pulmonary hypertension thought to be out of proportion lung disease followed by a physician at [facility].  Last CT chest was in 11/19, showing stable ILD (?UIP vs NSIP).  He had RHC in 2/20 with moderate PAH, PVR 5.1 WU.  He was tried on Adcirca but developed cognitive problems and worsening of peripheral edema so this was stopped.  He has been on home oxygen, uses at night and with exertion.   He was admitted in 6/20 with worsening dyspnea and peripheral edema, weight up despite compliance with torsemide 40 mg bid at home.  He has CKD stage 3 and torsemide was decreased in the spring. He was diuresed aggressively with fall in weight.  Echo showed normal LV size and systolic function, RV reportedly normal.  6/20 RHC showed normal filling pressures with moderate pulmonary hypertension, PVR 5.35 WU.   After discharge from hospital, patient started on ambrisentan.  His weight steadily began to increase and he developed worsening lower extremity edema, we stopped ambrisentan.   Patient was admitted in 8/20 with AKI after taking metolazone for several days.  He was admitted again in 8/20 with acute on chronic diastolic CHF. He was diuresed.  Wandering atrial pacemaker noted.   He was admitted in 3/21 with suspected CVA, MRI head showed punctate occipital lobe infarct, thought to be due to small vessel disease. ILR was implanted.  Echo in 3/21 showed EF 60-65%, severe LVH, normal RV.  PYP scan in 3/21 was not suggestive of TTR amyloidosis.  Subsequently, ILR has shown paroxysmal atrial fibrillation.   He was admitted later in  3/21 with AKI, creatinine up to 5.  However, he corrected rapidly to baseline with IVF and creatinine was 1.02 when discharge.   Echo was done today and reviewed, EF 65-70% with mild LVH, normal RV, IVC normal, PASP estimated 29 mmHg.   He returns for followup of CHF and pulmonary hypertension. Not using CPAP, feels like he cannot tolerate it.  He is taking metolazone every other week. Weight is down 6 lbs.  His legs are less swollen.  Main problem recently has been very poor sleep at night.  Restless leg syndrome may be the culprit here.  He is, therefore, very fatigued during the day.  No dyspnea walking around his apartment.  Not doing much exercise.  No orthopnea/PND.  Does not want to do 6 minute walk today because he did not sleep last night and is very tired.    ECG (personally reviewed): NSR, iRBBB  6 minute walk (8/21): 122 m 6 minute walk (11/21): 244 m 6 minute walk (3/22): 122 m  Labs (6/20): K 4.7, creatinine 1.46 Labs (7/20): K 3.1, creatinine 1.67 Labs (8/20): K 3.9, creatinine 1.8 Labs (9/20): K 4.9, creatinine 1.76, BNP 78 Labs (10/20): K 4.2, creatininine 1.5 Labs (11/20): BNP 36, K 4.3, creatinine 1.4 Labs (3/21): myeloma panel negative, urine immunofixation negative.  Labs (4/21): K 4.5, creatinine 1.02, hgb 11.4 Labs (6/21): K 5.1, creatinine 2.2, hgb 10.4 Labs (8/21): LDL 82 Labs (11/21): K 5.3, creatinine 2.11 Labs (3/22): K 3.9, creatinine 2.31 Labs (4/22): K 4.1, creatinine 2.15  PMH: 1.  Non-Hodgkin's lymphoma: Pelvic involvement.  S/p radiation.  Thought to be in remission (Dr. Benay Spice).  2. HTN 3. PACs/PVCs 4. GERD: Barrett's esophagus.  5. 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  - High resolution CT chest 11/19: ILD, ?UIP pattern.  6. Pulmonary hypertension: Thought to be out of proportion to underlying lung disease (ILD, ?NSIP vs UIP).   - RHC (2/20): mean RA 10, PASP 60 with mean 42, mean PCWP 11, CI 3, PVR 5.1 WU.  - Trial of Adcirca => failed due to  ?memory worsening/cognitive affects and peripheral edema.  - Echo (6/20): EF 60-65%, mild LVH, RV reportedly normal, PASP 59 mmHg.  - RHC (6/20): mean RA 7, PA 50/24 mean 34, mean PCWP 12, CI 2.01, PAPI 3.7, PVR 5.35 WU - RF negative, ANA negative, anti-SCL70 negative, anti-centromere negative.  - V/Q scan (8/20): No evidence for chronic PE.  - Echo (3/21): EF 60-65%, severe LVH, normal RV.  - Echo (6/22): EF 65-70% with mild LVH, normal RV, IVC normal, PASP estimated 29 mmHg. 7. CKD: Stage 3.   8. Wandering atrial pacemaker 9. OSA 10. PYP scan (3/21): H/CL ratio 1.0, grade 0.  11. CVA: MRI 3/21 with punctate occipital lobe infarct thought to be due to small vessel disease. - Carotid dopplers (3/21): 40-59% RICA stenosis.  - Carotid dopplers (4/22): 40-59% RICA stenosis.  12. Atrial fibrillation: Paroxysmal, noted on ILR.   Social History   Socioeconomic History  . Marital status: Married    Spouse name: Not on file  . Number of children: 2  . Years of education: Not on file  . Highest education level: Not on file  Occupational History  . 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 Smoking status: Former Smoker    Packs/day: 1.00    Years: 35.00    Pack years: 35.00    Types: Pipe, Cigarettes    Quit date: 02/23/1992    Years since quitting: 28.3  . Smokeless tobacco: Never Used  . Tobacco comment: quit 20 years ago  Vaping Use  . Vaping Use: Never used  Substance and Sexual Activity  . Alcohol use: Not Currently    Comment: 2 drinks daily scotch  AND WINE WITH SUPPER  . Drug use: No  . Sexual activity: Not on file  Other Topics Concern  . Not on file  Social History Narrative   Married - second marriage   He has two children   Retired Horticulturist, commercial Professor   Currently teaches pottery making, 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[place]   Former Smoker quit 12 years ago- smoked for 35 years   Alcohol use- not currently   Right handed    Social Determinants of Radio broadcast assistant Strain: Not on file  Food Insecurity: Not on file  Transportation Needs: Not on file  Physical Activity: Not on file  Stress: Not on file  Social Connections: Not on file  Intimate Partner Violence: Not on file   Family History  Problem Relation Age of Onset  . Heart disease Father        MI 73  . Urticaria Father   . Prostate cancer Brother   . Prostate cancer Paternal Uncle   . Prostate cancer Paternal Uncle   . Prostate cancer Paternal Uncle   . Hyperlipidemia Other   . Stroke Other   . Hypertension Other   . ADD / ADHD Other   . Colon cancer Neg Hx   .  Allergic rhinitis Neg Hx   . Asthma Neg Hx   . Eczema Neg Hx    ROS: All systems reviewed and negative except as per HPI.   Current Outpatient Medications  Medication Sig Dispense Refill  . Acetaminophen (TYLENOL PO) Take by mouth.     Marland Kitchen atorvastatin (LIPITOR) 40 MG tablet Take 1 tablet (40 mg total) by mouth daily at 6 PM. 30 tablet 1  . cyanocobalamin (,VITAMIN B-12,) 1000 MCG/ML injection One ml injection intramuscular once a week for 3 weeks and then monthly thereafter. 1 mL 14  . diltiazem (CARDIZEM CD) 120 MG 24 hr capsule Take 1 capsule (120 mg total) by mouth daily. 90 capsule 1  . ELIQUIS 2.5 MG TABS tablet TAKE ONE TABLET TWICE DAILY 60 tablet 5  . escitalopram (LEXAPRO) 10 MG tablet Take 10 mg by mouth daily.    . finasteride (PROSCAR) 5 MG tablet Take 5 mg by mouth daily.    Marland Kitchen FLONASE SENSIMIST 27.5 MCG/SPRAY nasal spray Place 1 spray into the nose daily as needed for rhinitis or allergies.     Marland Kitchen latanoprost (XALATAN) 0.005 % ophthalmic solution Place 1 drop into both eyes at bedtime.     . methylcellulose (CITRUCEL) oral powder Mix 2 grams in 8 ounces of water and drink twice a day. 454 g 0  . metolazone (ZAROXOLYN) 2.5 MG tablet Take every other Wednesday 5 tablet 3  . omeprazole (PRILOSEC) 20 MG capsule Take 20 mg by mouth daily.    . RESTASIS 0.05 %  ophthalmic emulsion 1 drop 2 (two) times daily.    . Riociguat (ADEMPAS) 2.5 MG TABS Take 2.5 mg by mouth 3 (three) times daily. 90 tablet 3  . rOPINIRole (REQUIP) 2 MG tablet Take 2 mg by mouth 2 (two) times daily.    . Selexipag (UPTRAVI PO) Take 2,000 mcg by mouth 2 (two) times daily.    Marland Kitchen spironolactone (ALDACTONE) 25 MG tablet Take 0.5 tablets (12.5 mg total) by mouth daily. 15 tablet 3  . torsemide (DEMADEX) 20 MG tablet TAKE 5 TABLETS TWICE DAILY 300 tablet 3  . traMADol (ULTRAM) 50 MG tablet Take 50 mg by mouth every 6 (six) hours as needed for moderate pain.     . traZODone (DESYREL) 100 MG tablet Take 100 mg by mouth at bedtime.     No current facility-administered medications for this encounter.   BP 134/77   Pulse 83   Wt 79.9 kg (176 lb 3.2 oz)   SpO2 100% Comment: 2 L n/c  BMI 27.60 kg/m  General: NAD Neck: No JVD, no thyromegaly or thyroid nodule.  Lungs: Clear to auscultation bilaterally with normal respiratory effort. CV: Nondisplaced PMI.  Heart regular S1/S2, no S3/S4, 1/6 SEM RUSB.  2+ edema of feet.  No carotid bruit.  Normal pedal pulses.  Abdomen: Soft, nontender, no hepatosplenomegaly, no distention.  Skin: Intact without lesions or rashes.  Neurologic: Alert and oriented x 3.  Psych: Normal affect. Extremities: No clubbing or cyanosis.  HEENT: Normal.   Assessment/Plan: 1. Chronic diastolic CHF: Suspect with significant component of RV failure from pulmonary hypertension. Echo in 6/20 with EF 60-65%, RV not well-visualized but PASP 59 mmHg.  Echo (3/21) with EF 60-65%, severe LVH, normal RV, PASP 28 mmHg.  PYP scan was not suggestive of TTR cardiac amyloidosis. Echo today showed EF 65-70% with mild LVH, normal RV, IVC normal, PASP estimated 29 mmHg.  NYHA class III symptoms.  Volume status looks better overall and  weight is down.  - Continue torsemide 100 mg bid and metolazone 2.5 mg every 2 wks on Wednesday. BMET/BNP today.     - Continue spironolactone 12.5  mg daily. 2. CKD Stage 3: BMET today. 3. Pulmonary hypertension: Patient thought to have [place] out of proportion to his lung disease (ILD, NSIP versus UIP). RHC in 2/20 showed mean PA pressure 41, PVR 5.1 WU, CI 3. Echo in 6/20 with RV reportedly ok =>I reviewed and do not think that RV was well-visualized. Patient did not tolerate Adcirca due to edema/?cognitive effects.  RHC was done again in 6/20, showed moderate pulmonary arterial hypertension with PVR 5.35 (comparable to prior).  Serological workup for PH was negative. Patient started ambrisentan 5 mg and developed significant edema/volume overload despite a high dose of torsemide. He is now on Uptravi + riociguat.  Most recent echo today showed normal RV size/systolic function and PA systolic pressure estimate was normal.   - 6 minute walk next appt.  - he will stay off ambrisentan (?trigger for volume overload).  - As above, he did not tolerate tadalafil.  - He is at 2000 mcg bid Uptravi.  With excellent echo today, will keep at this dose.   - Continue riociguat at goal dose 2.5 mg tid.  4. Atrial fibrillation: Paroxysmal, noted on ILR. NSR today.  - Continue apixaban 2.5 mg bid (dosed for age, renal dysfunction).  5. OSA: Cannot tolerate CPAP.  6. CVA: In 3/21, may have been atrial fibrillation-related.  On apixaban now.  7. Carotid stenosis: 40-59% RICA stenosis on 4/22 dopplers.  - Repeat 4/23.    8. Interstitial lung disease:  Has seen pulmonary, ILD seems mild and stable.   Followup 2 months with APP  Loralie Champagne 07/08/2020

## 2020-07-08 NOTE — Patient Instructions (Addendum)
EKG done today.  Labs done today. We will contact you only if your labs are abnormal.  DO NOT INCREASE UPTRAVI  No medication changes were made. Please continue all current medications as prescribed.  Your physician recommends that you schedule a follow-up appointment in: 2 months with our [facility] here in our office.   If you have any questions or concerns before your next appointment please send [place] a message through [place] or call our office at [phone].    TO LEAVE A MESSAGE FOR THE NURSE SELECT OPTION 2, PLEASE LEAVE A MESSAGE INCLUDING: . YOUR NAME . DATE OF BIRTH . CALL BACK NUMBER . REASON FOR CALL**this is important as we prioritize the call backs  YOU WILL RECEIVE A CALL BACK THE SAME DAY AS LONG AS YOU CALL BEFORE 4:00 PM   Do the following things EVERYDAY: 1) Weigh yourself in the morning before breakfast. Write it down and keep it in a log. 2) Take your medicines as prescribed 3) Eat low salt foods--Limit salt (sodium) to 2000 mg per day.  4) Stay as active as you can everyday 5) Limit all fluids for the day to less than 2 liters   At the [facility], you and your health needs are our priority. As part of our continuing mission to provide you with exceptional heart care, we have created designated Provider Care Teams. These Care Teams include your primary Cardiologist (physician) and Advanced Practice Providers (APPs- Physician Assistants and Nurse Practitioners) who all work together to provide you with the care you need, when you need it.   You may see any of the following providers on your designated Care Team at your next follow up: Marland Kitchen Dr Glori Bickers . Dr Loralie Champagne . Darrick Grinder, NP . Lyda Jester, PA . Audry Riles, PharmD   Please be sure to bring in all your medications bottles to every appointment.

## 2020-07-09 ENCOUNTER — Telehealth: Payer: Self-pay | Admitting: Adult Health

## 2020-07-09 NOTE — Telephone Encounter (Signed)
noted 

## 2020-07-09 NOTE — Telephone Encounter (Signed)
FYI pt wants Megan,NP to know that he is now taking rOPINIRole (REQUIP) 2 MG tablet 2 times a day and he is doing very well on it.  Pt has not asked for a call back.

## 2020-07-11 ENCOUNTER — Other Ambulatory Visit (HOSPITAL_COMMUNITY): Payer: Self-pay | Admitting: Cardiology

## 2020-07-16 ENCOUNTER — Ambulatory Visit: Payer: Medicare HMO | Admitting: Neurology

## 2020-07-28 NOTE — Progress Notes (Signed)
Carelink Summary Report / Loop Recorder

## 2020-08-10 LAB — CUP PACEART REMOTE DEVICE CHECK
Date Time Interrogation Session: 20220705230755
Implantable Pulse Generator Implant Date: 20210325

## 2020-08-11 ENCOUNTER — Ambulatory Visit (INDEPENDENT_AMBULATORY_CARE_PROVIDER_SITE_OTHER): Payer: Medicare HMO

## 2020-08-11 DIAGNOSIS — Z8673 Personal history of transient ischemic attack (TIA), and cerebral infarction without residual deficits: Secondary | ICD-10-CM

## 2020-09-01 ENCOUNTER — Other Ambulatory Visit: Payer: Self-pay | Admitting: Adult Health

## 2020-09-03 NOTE — Progress Notes (Signed)
Carelink Summary Report / Loop Recorder

## 2020-09-06 NOTE — Progress Notes (Addendum)
PCP: Roetta Sessions, NP Cardiology: Dr. Aundra Dubin  87 y.o. with history of interstitial lung disease (?NSIP vs UIP) on home oxygen, pulmonary hypertension, diastolic CHF, and prior non-Hodgkins lymphoma (pelvic mass, s/p XRT) presents for followup of CHF and pulmonary hypertension.  Patient has history of reportedly mild ILD (NSIP vs UIP) followed by a pulmonologist at [facility] and pulmonary hypertension thought to be out of proportion lung disease followed by a physician at [facility].  Last CT chest was in 11/19, showing stable ILD (?UIP vs NSIP).  He had RHC in 2/20 with moderate PAH, PVR 5.1 WU.  He was tried on Adcirca but developed cognitive problems and worsening of peripheral edema so this was stopped.  He has been on home oxygen, uses at night and with exertion.    He was admitted in 6/20 with worsening dyspnea and peripheral edema, weight up despite compliance with torsemide 40 mg bid at home.  He has CKD stage 3 and torsemide was decreased in the spring. He was diuresed aggressively with fall in weight.  Echo showed normal LV size and systolic function, RV reportedly normal.  6/20 RHC showed normal filling pressures with moderate pulmonary hypertension, PVR 5.35 WU.   After discharge from hospital, patient started on ambrisentan.  His weight steadily began to increase and he developed worsening lower extremity edema, we stopped ambrisentan.   Patient was admitted in 8/20 with AKI after taking metolazone for several days.  He was admitted again in 8/20 with acute on chronic diastolic CHF. He was diuresed.  Wandering atrial pacemaker noted.   He was admitted in 3/21 with suspected CVA, MRI head showed punctate occipital lobe infarct, thought to be due to small vessel disease. ILR was implanted.  Echo in 3/21 showed EF 60-65%, severe LVH, normal RV.  PYP scan in 3/21 was not suggestive of TTR amyloidosis.  Subsequently, ILR has shown paroxysmal atrial fibrillation.   He was admitted later in  3/21 with AKI, creatinine up to 5.  However, he corrected rapidly to baseline with IVF and creatinine was 1.02 when discharge.   Echo 6/22, EF 65-70% with mild LVH, normal RV, IVC normal, PASP estimated 29 mmHg.   Today he returns for HF follow up with his wife. He is still living at [place]. Overall feeling ok, wears 2L oxygen throughout the day. Not wearing oxygen in clinic today. No dyspnea walking about his apartment. Struggling with worsening LE for the past 2 weeks. Eating more salty foods in the dining hall (he is cooking for himself less due to failing eyesight). Denies CP, dizziness. Chronically sleeps in a recliner due to back pain. Appetite ok. Taking all medications. Still struggling with resless legs at night. Not wearing CPAP at night. Feels his left breast is enlarged.  ECG (personally reviewed): SR iRBBB qrs 110 ms  6 minute walk (8/21): 122 m 6 minute walk (11/21): 244 m 6 minute walk (3/22): 122 m  Labs (6/20): K 4.7, creatinine 1.46 Labs (7/20): K 3.1, creatinine 1.67 Labs (8/20): K 3.9, creatinine 1.8 Labs (9/20): K 4.9, creatinine 1.76, BNP 78 Labs (10/20): K 4.2, creatininine 1.5 Labs (11/20): BNP 36, K 4.3, creatinine 1.4 Labs (3/21): myeloma panel negative, urine immunofixation negative.  Labs (4/21): K 4.5, creatinine 1.02, hgb 11.4 Labs (6/21): K 5.1, creatinine 2.2, hgb 10.4 Labs (8/21): LDL 82 Labs (11/21): K 5.3, creatinine 2.11 Labs (3/22): K 3.9, creatinine 2.31 Labs (4/22): K 4.1, creatinine 2.15 Labs (6/22): K 3.6, creatinine  1.88  PMH: 1. Non-Hodgkin's lymphoma: Pelvic involvement.  S/p radiation.  Thought to be in remission (Dr. Benay Spice).  2. HTN 3. PACs/PVCs 4. GERD: Barrett's esophagus.  5. 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  - High resolution CT chest 11/19: ILD, ?UIP pattern.  6. Pulmonary hypertension: Thought to be out of proportion to underlying lung disease (ILD, ?NSIP vs UIP).   - RHC (2/20): mean RA 10, PASP 60 with mean  42, mean PCWP 11, CI 3, PVR 5.1 WU.  - Trial of Adcirca => failed due to ?memory worsening/cognitive affects and peripheral edema.  - Echo (6/20): EF 60-65%, mild LVH, RV reportedly normal, PASP 59 mmHg.  - RHC (6/20): mean RA 7, PA 50/24 mean 34, mean PCWP 12, CI 2.01, PAPI 3.7, PVR 5.35 WU - RF negative, ANA negative, anti-SCL70 negative, anti-centromere negative.  - V/Q scan (8/20): No evidence for chronic PE.  - Echo (3/21): EF 60-65%, severe LVH, normal RV.  - Echo (6/22): EF 65-70% with mild LVH, normal RV, IVC normal, PASP estimated 29 mmHg. 7. CKD: Stage 3.   8. Wandering atrial pacemaker 9. OSA 10. PYP scan (3/21): H/CL ratio 1.0, grade 0.  11. CVA: MRI 3/21 with punctate occipital lobe infarct thought to be due to small vessel disease. - Carotid dopplers (3/21): 40-59% RICA stenosis.  - Carotid dopplers (4/22): 40-59% RICA stenosis.  12. Atrial fibrillation: Paroxysmal, noted on ILR.   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  Tobacco Use   Smoking status: Former    Packs/day: 1.00    Years: 35.00    Pack years: 35.00    Types: Pipe, Cigarettes    Quit date: 02/23/1992    Years since quitting: 28.5   Smokeless tobacco: Never   Tobacco comments:    quit 20 years ago  Vaping Use   Vaping Use: Never used  Substance and Sexual Activity   Alcohol use: Not Currently    Comment: 2 drinks daily scotch  AND WINE WITH SUPPER   Drug use: No   Sexual activity: Not on file  Other Topics Concern   Not on file  Social History Narrative   Married - second marriage   He has two children   Retired Horticulturist, commercial Professor   Currently teaches pottery making, 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[facility]   Former Smoker quit 12 years ago- smoked for 35 years    Alcohol use- not currently   Right handed   Social Determinants of Radio broadcast assistant Strain: Not on file  Food Insecurity: Not on file  Transportation Needs: Not on file  Physical Activity: Not on file  Stress: Not on file  Social Connections: Not on file  Intimate Partner Violence: Not on file   Family History  Problem Relation Age of Onset   Heart disease Father        MI 48   Urticaria Father    Prostate cancer Brother    Prostate cancer Paternal Uncle    Prostate cancer Paternal Uncle    Prostate cancer Paternal Uncle    Hyperlipidemia Other    Stroke Other    Hypertension Other    ADD / ADHD Other  Colon cancer Neg Hx    Allergic rhinitis Neg Hx    Asthma Neg Hx    Eczema Neg Hx    ROS: All systems reviewed and negative except as per HPI.   Current Outpatient Medications  Medication Sig Dispense Refill   Acetaminophen (TYLENOL PO) Take 500 mg by mouth. As needed     atorvastatin (LIPITOR) 40 MG tablet Take 1 tablet (40 mg total) by mouth daily at 6 PM. 30 tablet 1   cyanocobalamin (,VITAMIN B-12,) 1000 MCG/ML injection INJECT 1ML INTRAMUSCULARLY ONCE A WEEK FOR 3 WEEKS THEN MONTHLY THERAFTER 1 mL 14   diltiazem (CARDIZEM CD) 120 MG 24 hr capsule Take 1 capsule (120 mg total) by mouth daily. 90 capsule 1   ELIQUIS 2.5 MG TABS tablet TAKE ONE TABLET TWICE DAILY 60 tablet 11   escitalopram (LEXAPRO) 10 MG tablet Take 10 mg by mouth daily.     finasteride (PROSCAR) 5 MG tablet Take 5 mg by mouth daily.     FLONASE SENSIMIST 27.5 MCG/SPRAY nasal spray Place 1 spray into the nose daily as needed for rhinitis or allergies.      methylcellulose (CITRUCEL) oral powder Mix 2 grams in 8 ounces of water and drink twice a day. 454 g 0   metolazone (ZAROXOLYN) 2.5 MG tablet Take every other Wednesday 5 tablet 3   omeprazole (PRILOSEC) 20 MG capsule Take 20 mg by mouth daily.     Riociguat (ADEMPAS) 2.5 MG TABS Take 2.5 mg by mouth 3 (three) times daily. 90 tablet 3    rOPINIRole (REQUIP) 2 MG tablet 2 mg. Patient takes  by mouth three times a day.     Selexipag (UPTRAVI PO) Take 2,000 mcg by mouth 2 (two) times daily.     spironolactone (ALDACTONE) 25 MG tablet Take 0.5 tablets (12.5 mg total) by mouth daily. 15 tablet 3   torsemide (DEMADEX) 20 MG tablet TAKE 5 TABLETS TWICE DAILY 300 tablet 3   traMADol (ULTRAM) 50 MG tablet Take 50 mg by mouth every 6 (six) hours as needed for moderate pain.      traZODone (DESYREL) 100 MG tablet Take 100 mg by mouth at bedtime.     No current facility-administered medications for this encounter.   Wt Readings from Last 3 Encounters:  09/08/20 82 kg (180 lb 12.8 oz)  07/08/20 79.9 kg (176 lb 3.2 oz)  06/25/20 83.5 kg (184 lb)   BP 100/66   Pulse 81   Wt 82 kg (180 lb 12.8 oz)   SpO2 95%   BMI 28.32 kg/m  General:  NAD. No resp difficulty, elderly, walked into clinic with RW HEENT: Normal Neck: Supple. JVP 6-7. Carotids 2+ bilat; no bruits. No lymphadenopathy or thryomegaly appreciated. Cor: PMI nondisplaced. Regular rate & rhythm. No rubs, gallops, I/VI SEM RUSB Lungs: Clear Abdomen: Soft, nontender, nondistended. No hepatosplenomegaly. No bruits or masses. Good bowel sounds. Extremities: No cyanosis, clubbing, rash, 2+ ankle edema  Neuro: Alert & oriented x 3, cranial nerves grossly intact. Moves all 4 extremities w/o difficulty. Affect pleasant.  Assessment/Plan: 1. Chronic diastolic CHF: Suspect with significant component of RV failure from pulmonary hypertension.  Echo in 6/20 with EF 60-65%, RV not well-visualized but PASP 59 mmHg.  Echo (3/21) with EF 60-65%, severe LVH, normal RV, PASP 28 mmHg.  PYP scan was not suggestive of TTR cardiac amyloidosis. Echo 6/22 showed EF 65-70% with mild LVH, normal RV, IVC normal, PASP estimated 29 mmHg.  NYHA class III symptoms.  Volume status elevated today, he is up 4 lbs. ReDs 32%.  - He will take metolazone 2.5 mg x 1 today. Then resume regular schedule of every  other week (Mondays). - Continue torsemide 100 mg bid. BMET/BNP today, repeat BMET 10 days. - Stop spiro. Start eplerenone 12.5 mg daily. - He is unable to tolerate unna boots or compression hose. Needs to elevate legs as much as possible when at home. 2. CKD Stage 3: BMET today. 3. Pulmonary hypertension: Patient thought to have [place] out of proportion to his lung disease (ILD, NSIP versus UIP). RHC in 2/20 showed mean PA pressure 41, PVR 5.1 WU, CI 3. Echo in 6/20 with RV reportedly ok => I reviewed and do not think that RV was well-visualized.  Patient did not tolerate Adcirca due to edema/?cognitive effects.  RHC was done again in 6/20, showed moderate pulmonary arterial hypertension with PVR 5.35 (comparable to prior).  Serological workup for PH was negative. Patient started ambrisentan 5 mg and developed significant edema/volume overload despite a high dose of torsemide. He is now on Uptravi + riociguat.  Most recent echo 6/22 showed normal RV size/systolic function and PA systolic pressure estimate was normal.   - He declined 6 MW today. - He will stay off ambrisentan (?trigger for volume overload).  - As above, he did not tolerate tadalafil.  - He is at 2000 mcg bid Uptravi.  With excellent echo 6/22, will keep at this dose.   - Continue riociguat at goal dose 2.5 mg tid.  4. Atrial fibrillation: Paroxysmal, noted on ILR. NSR today.  - Continue apixaban 2.5 mg bid (dosed for age, renal dysfunction).  5. OSA: Cannot tolerate CPAP.  6. CVA: In 3/21, may have been atrial fibrillation-related.  On apixaban now.  7. Carotid stenosis: 40-59% RICA stenosis on 4/22 dopplers.  - Repeat 4/23.    8. Interstitial lung disease:  Has seen pulmonary, ILD seems mild and stable.  9. Breast swelling, left: I don't appreciate a nodule or overt enlargement, but reasonable to stop spiro and start eplerenone as above.  Followup in 2 months with Dr. Wynema Birch [facility] FNP 09/08/2020

## 2020-09-08 ENCOUNTER — Encounter (HOSPITAL_COMMUNITY): Payer: Self-pay

## 2020-09-08 ENCOUNTER — Ambulatory Visit (HOSPITAL_COMMUNITY)
Admission: RE | Admit: 2020-09-08 | Discharge: 2020-09-08 | Disposition: A | Discharge status: Home / Self Care | Payer: Medicare HMO | Source: Ambulatory Visit | Attending: Family Medicine | Admitting: Family Medicine

## 2020-09-08 ENCOUNTER — Other Ambulatory Visit: Payer: Self-pay

## 2020-09-08 VITALS — BP 100/66 | HR 81 | Wt 180.8 lb

## 2020-09-08 DIAGNOSIS — I5032 Chronic diastolic (congestive) heart failure: Secondary | ICD-10-CM | POA: Diagnosis not present

## 2020-09-08 DIAGNOSIS — I2721 Secondary pulmonary arterial hypertension: Secondary | ICD-10-CM | POA: Insufficient documentation

## 2020-09-08 DIAGNOSIS — N63 Unspecified lump in unspecified breast: Secondary | ICD-10-CM

## 2020-09-08 DIAGNOSIS — Z79899 Other long term (current) drug therapy: Secondary | ICD-10-CM | POA: Diagnosis not present

## 2020-09-08 DIAGNOSIS — I480 Paroxysmal atrial fibrillation: Secondary | ICD-10-CM

## 2020-09-08 DIAGNOSIS — N183 Chronic kidney disease, stage 3 unspecified: Secondary | ICD-10-CM | POA: Insufficient documentation

## 2020-09-08 DIAGNOSIS — J8410 Pulmonary fibrosis, unspecified: Secondary | ICD-10-CM | POA: Insufficient documentation

## 2020-09-08 DIAGNOSIS — G4733 Obstructive sleep apnea (adult) (pediatric): Secondary | ICD-10-CM

## 2020-09-08 DIAGNOSIS — I272 Pulmonary hypertension, unspecified: Secondary | ICD-10-CM

## 2020-09-08 DIAGNOSIS — Z8673 Personal history of transient ischemic attack (TIA), and cerebral infarction without residual deficits: Secondary | ICD-10-CM

## 2020-09-08 DIAGNOSIS — Z87891 Personal history of nicotine dependence: Secondary | ICD-10-CM | POA: Diagnosis not present

## 2020-09-08 DIAGNOSIS — J849 Interstitial pulmonary disease, unspecified: Secondary | ICD-10-CM

## 2020-09-08 DIAGNOSIS — N6320 Unspecified lump in the left breast, unspecified quadrant: Secondary | ICD-10-CM | POA: Diagnosis not present

## 2020-09-08 DIAGNOSIS — I130 Hypertensive heart and chronic kidney disease with heart failure and stage 1 through stage 4 chronic kidney disease, or unspecified chronic kidney disease: Secondary | ICD-10-CM | POA: Diagnosis not present

## 2020-09-08 DIAGNOSIS — N1831 Chronic kidney disease, stage 3a: Secondary | ICD-10-CM | POA: Diagnosis not present

## 2020-09-08 DIAGNOSIS — Z09 Encounter for follow-up examination after completed treatment for conditions other than malignant neoplasm: Secondary | ICD-10-CM | POA: Diagnosis not present

## 2020-09-08 DIAGNOSIS — I6523 Occlusion and stenosis of bilateral carotid arteries: Secondary | ICD-10-CM

## 2020-09-08 DIAGNOSIS — Z8249 Family history of ischemic heart disease and other diseases of the circulatory system: Secondary | ICD-10-CM | POA: Insufficient documentation

## 2020-09-08 DIAGNOSIS — Z9981 Dependence on supplemental oxygen: Secondary | ICD-10-CM | POA: Diagnosis not present

## 2020-09-08 DIAGNOSIS — Z7901 Long term (current) use of anticoagulants: Secondary | ICD-10-CM | POA: Insufficient documentation

## 2020-09-08 LAB — BASIC METABOLIC PANEL
Anion gap: 11 (ref 5–15)
BUN: 54 mg/dL — ABNORMAL HIGH (ref 8–23)
CO2: 32 mmol/L (ref 22–32)
Calcium: 8.4 mg/dL — ABNORMAL LOW (ref 8.9–10.3)
Chloride: 93 mmol/L — ABNORMAL LOW (ref 98–111)
Creatinine, Ser: 1.94 mg/dL — ABNORMAL HIGH (ref 0.61–1.24)
GFR, Estimated: 33 mL/min — ABNORMAL LOW (ref >60)
Glucose, Bld: 97 mg/dL (ref 70–99)
Potassium: 4.1 mmol/L (ref 3.5–5.1)
Sodium: 136 mmol/L (ref 135–145)

## 2020-09-08 LAB — BRAIN NATRIURETIC PEPTIDE: B Natriuretic Peptide: 133.1 pg/mL — ABNORMAL HIGH (ref 0.0–100.0)

## 2020-09-08 MED ORDER — METOLAZONE 2.5 MG PO TABS: ORAL_TABLET | ORAL | 3 refills | Status: DC

## 2020-09-08 MED ORDER — EPLERENONE 25 MG PO TABS
12.5000 mg | ORAL_TABLET | Freq: Every day | ORAL | 11 refills | Status: DC
Start: 2020-09-08 — End: 2020-10-21

## 2020-09-08 NOTE — Progress Notes (Signed)
ReDS Vest / Clip - 09/08/20 1400       ReDS Vest / Clip   Station Marker D    Ruler Value 27    ReDS Value Range Low volume    ReDS Actual Value 32

## 2020-09-08 NOTE — Patient Instructions (Signed)
STOP Spironolactone START Inspara  12.5 mg (one half tab) daily TAKE Metolazone 2.5 mg, today and resume normal dose of every other Monday thereafter  Labs today We will only contact you if something comes back abnormal or we need to make some changes. Otherwise no news is good news!  Labs needed in 7-10 days  .Your physician recommends that you schedule a follow-up appointment in: 6-8 weeks with Dr Aundra Dubin  Do the following things EVERYDAY: Weigh yourself in the morning before breakfast. Write it down and keep it in a log. Take your medicines as prescribed Eat low salt foods--Limit salt (sodium) to 2000 mg per day.  Stay as active as you can everyday Limit all fluids for the day to less than 2 liters  milAt the [facility], you and your health needs are our priority. As part of our continuing mission to provide you with exceptional heart care, we have created designated Provider Care Teams. These Care Teams include your primary Cardiologist (physician) and Advanced Practice Providers (APPs- Physician Assistants and Nurse Practitioners) who all work together to provide you with the care you need, when you need it.   You may see any of the following providers on your designated Care Team at your next follow up: Dr Glori Bickers Dr Loralie Champagne Dr Patrice Paradise, NP Lyda Jester, [place] Ginnie Smart Audry Riles, PharmD   Please be sure to bring in all your medications bottles to every appointment.

## 2020-09-09 NOTE — Addendum Note (Signed)
Encounter addended by: Rafael Bihari, FNP on: 09/09/2020 8:09 AM  Actions taken: Clinical Note Signed

## 2020-09-15 ENCOUNTER — Ambulatory Visit (INDEPENDENT_AMBULATORY_CARE_PROVIDER_SITE_OTHER): Payer: Medicare HMO

## 2020-09-15 DIAGNOSIS — I6310 Cerebral infarction due to embolism of unspecified precerebral artery: Secondary | ICD-10-CM | POA: Diagnosis not present

## 2020-09-17 LAB — CUP PACEART REMOTE DEVICE CHECK
Date Time Interrogation Session: 20220816071355
Implantable Pulse Generator Implant Date: 20210325

## 2020-09-18 ENCOUNTER — Ambulatory Visit (HOSPITAL_COMMUNITY)
Admission: RE | Admit: 2020-09-18 | Discharge: 2020-09-18 | Disposition: A | Discharge status: Home / Self Care | Payer: Medicare HMO | Source: Ambulatory Visit | Attending: Internal Medicine | Admitting: Internal Medicine

## 2020-09-18 ENCOUNTER — Other Ambulatory Visit: Payer: Self-pay

## 2020-09-18 DIAGNOSIS — I5032 Chronic diastolic (congestive) heart failure: Secondary | ICD-10-CM | POA: Diagnosis present

## 2020-09-18 LAB — BASIC METABOLIC PANEL
Anion gap: 12 (ref 5–15)
BUN: 99 mg/dL — ABNORMAL HIGH (ref 8–23)
CO2: 32 mmol/L (ref 22–32)
Calcium: 7.8 mg/dL — ABNORMAL LOW (ref 8.9–10.3)
Chloride: 90 mmol/L — ABNORMAL LOW (ref 98–111)
Creatinine, Ser: 2.3 mg/dL — ABNORMAL HIGH (ref 0.61–1.24)
GFR, Estimated: 27 mL/min — ABNORMAL LOW (ref >60)
Glucose, Bld: 99 mg/dL (ref 70–99)
Potassium: 4.3 mmol/L (ref 3.5–5.1)
Sodium: 134 mmol/L — ABNORMAL LOW (ref 135–145)

## 2020-10-03 NOTE — Progress Notes (Signed)
Carelink Summary Report / Loop Recorder

## 2020-10-15 ENCOUNTER — Telehealth (HOSPITAL_COMMUNITY): Payer: Self-pay | Admitting: Pharmacist

## 2020-10-15 MED ORDER — ADEMPAS 2.5 MG PO TABS: 2.5000 mg | ORAL_TABLET | Freq: Three times a day (TID) | ORAL | 11 refills | Status: DC

## 2020-10-15 NOTE — Telephone Encounter (Signed)
Adempas refills sent to [facility].   Audry Riles, PharmD, BCPS, BCCP, CPP [facility] Pharmacist [phone]

## 2020-10-17 ENCOUNTER — Inpatient Hospital Stay (HOSPITAL_COMMUNITY)
Admission: EM | Admit: 2020-10-17 | Discharge: 2020-10-21 | DRG: 394 | Disposition: A | Discharge status: Home Health | Payer: Medicare HMO | Source: Skilled Nursing Facility | Attending: Internal Medicine | Admitting: Internal Medicine

## 2020-10-17 ENCOUNTER — Encounter (HOSPITAL_COMMUNITY): Payer: Self-pay

## 2020-10-17 ENCOUNTER — Emergency Department (HOSPITAL_COMMUNITY): Payer: Medicare HMO

## 2020-10-17 ENCOUNTER — Other Ambulatory Visit: Payer: Self-pay

## 2020-10-17 DIAGNOSIS — N400 Enlarged prostate without lower urinary tract symptoms: Secondary | ICD-10-CM | POA: Diagnosis present

## 2020-10-17 DIAGNOSIS — I272 Pulmonary hypertension, unspecified: Secondary | ICD-10-CM | POA: Diagnosis present

## 2020-10-17 DIAGNOSIS — K922 Gastrointestinal hemorrhage, unspecified: Secondary | ICD-10-CM

## 2020-10-17 DIAGNOSIS — D649 Anemia, unspecified: Secondary | ICD-10-CM

## 2020-10-17 DIAGNOSIS — K2270 Barrett's esophagus without dysplasia: Secondary | ICD-10-CM | POA: Diagnosis present

## 2020-10-17 DIAGNOSIS — Z923 Personal history of irradiation: Secondary | ICD-10-CM

## 2020-10-17 DIAGNOSIS — Z8572 Personal history of non-Hodgkin lymphomas: Secondary | ICD-10-CM | POA: Diagnosis not present

## 2020-10-17 DIAGNOSIS — I5032 Chronic diastolic (congestive) heart failure: Secondary | ICD-10-CM | POA: Diagnosis not present

## 2020-10-17 DIAGNOSIS — Z823 Family history of stroke: Secondary | ICD-10-CM

## 2020-10-17 DIAGNOSIS — H547 Unspecified visual loss: Secondary | ICD-10-CM | POA: Diagnosis present

## 2020-10-17 DIAGNOSIS — Z8719 Personal history of other diseases of the digestive system: Secondary | ICD-10-CM

## 2020-10-17 DIAGNOSIS — K22719 Barrett's esophagus with dysplasia, unspecified: Secondary | ICD-10-CM | POA: Diagnosis not present

## 2020-10-17 DIAGNOSIS — Z9981 Dependence on supplemental oxygen: Secondary | ICD-10-CM | POA: Diagnosis not present

## 2020-10-17 DIAGNOSIS — K449 Diaphragmatic hernia without obstruction or gangrene: Secondary | ICD-10-CM | POA: Diagnosis present

## 2020-10-17 DIAGNOSIS — Z8249 Family history of ischemic heart disease and other diseases of the circulatory system: Secondary | ICD-10-CM

## 2020-10-17 DIAGNOSIS — G2581 Restless legs syndrome: Secondary | ICD-10-CM | POA: Diagnosis present

## 2020-10-17 DIAGNOSIS — J449 Chronic obstructive pulmonary disease, unspecified: Secondary | ICD-10-CM | POA: Diagnosis present

## 2020-10-17 DIAGNOSIS — K219 Gastro-esophageal reflux disease without esophagitis: Secondary | ICD-10-CM | POA: Diagnosis present

## 2020-10-17 DIAGNOSIS — D62 Acute posthemorrhagic anemia: Secondary | ICD-10-CM | POA: Diagnosis present

## 2020-10-17 DIAGNOSIS — I872 Venous insufficiency (chronic) (peripheral): Secondary | ICD-10-CM | POA: Diagnosis present

## 2020-10-17 DIAGNOSIS — I9589 Other hypotension: Secondary | ICD-10-CM | POA: Diagnosis present

## 2020-10-17 DIAGNOSIS — Z7901 Long term (current) use of anticoagulants: Secondary | ICD-10-CM | POA: Diagnosis not present

## 2020-10-17 DIAGNOSIS — K317 Polyp of stomach and duodenum: Principal | ICD-10-CM | POA: Diagnosis present

## 2020-10-17 DIAGNOSIS — I6310 Cerebral infarction due to embolism of unspecified precerebral artery: Secondary | ICD-10-CM | POA: Diagnosis not present

## 2020-10-17 DIAGNOSIS — Z8042 Family history of malignant neoplasm of prostate: Secondary | ICD-10-CM

## 2020-10-17 DIAGNOSIS — J4540 Moderate persistent asthma, uncomplicated: Secondary | ICD-10-CM | POA: Diagnosis present

## 2020-10-17 DIAGNOSIS — E1151 Type 2 diabetes mellitus with diabetic peripheral angiopathy without gangrene: Secondary | ICD-10-CM | POA: Diagnosis present

## 2020-10-17 DIAGNOSIS — Z20822 Contact with and (suspected) exposure to covid-19: Secondary | ICD-10-CM | POA: Diagnosis present

## 2020-10-17 DIAGNOSIS — M1990 Unspecified osteoarthritis, unspecified site: Secondary | ICD-10-CM | POA: Diagnosis present

## 2020-10-17 DIAGNOSIS — E1142 Type 2 diabetes mellitus with diabetic polyneuropathy: Secondary | ICD-10-CM | POA: Diagnosis not present

## 2020-10-17 DIAGNOSIS — D509 Iron deficiency anemia, unspecified: Secondary | ICD-10-CM | POA: Diagnosis present

## 2020-10-17 DIAGNOSIS — N1831 Chronic kidney disease, stage 3a: Secondary | ICD-10-CM | POA: Diagnosis not present

## 2020-10-17 DIAGNOSIS — N183 Chronic kidney disease, stage 3 unspecified: Secondary | ICD-10-CM | POA: Diagnosis present

## 2020-10-17 DIAGNOSIS — I130 Hypertensive heart and chronic kidney disease with heart failure and stage 1 through stage 4 chronic kidney disease, or unspecified chronic kidney disease: Secondary | ICD-10-CM | POA: Diagnosis present

## 2020-10-17 DIAGNOSIS — Z8673 Personal history of transient ischemic attack (TIA), and cerebral infarction without residual deficits: Secondary | ICD-10-CM

## 2020-10-17 DIAGNOSIS — I480 Paroxysmal atrial fibrillation: Secondary | ICD-10-CM | POA: Diagnosis present

## 2020-10-17 DIAGNOSIS — I4510 Unspecified right bundle-branch block: Secondary | ICD-10-CM | POA: Diagnosis present

## 2020-10-17 DIAGNOSIS — J9611 Chronic respiratory failure with hypoxia: Secondary | ICD-10-CM | POA: Diagnosis present

## 2020-10-17 DIAGNOSIS — K5909 Other constipation: Secondary | ICD-10-CM | POA: Diagnosis present

## 2020-10-17 DIAGNOSIS — I10 Essential (primary) hypertension: Secondary | ICD-10-CM | POA: Diagnosis not present

## 2020-10-17 DIAGNOSIS — E1122 Type 2 diabetes mellitus with diabetic chronic kidney disease: Secondary | ICD-10-CM | POA: Diagnosis present

## 2020-10-17 DIAGNOSIS — J849 Interstitial pulmonary disease, unspecified: Secondary | ICD-10-CM | POA: Diagnosis present

## 2020-10-17 DIAGNOSIS — Z79890 Hormone replacement therapy (postmenopausal): Secondary | ICD-10-CM

## 2020-10-17 DIAGNOSIS — D631 Anemia in chronic kidney disease: Secondary | ICD-10-CM | POA: Diagnosis present

## 2020-10-17 DIAGNOSIS — Z87891 Personal history of nicotine dependence: Secondary | ICD-10-CM

## 2020-10-17 DIAGNOSIS — Z79899 Other long term (current) drug therapy: Secondary | ICD-10-CM

## 2020-10-17 DIAGNOSIS — E785 Hyperlipidemia, unspecified: Secondary | ICD-10-CM | POA: Diagnosis present

## 2020-10-17 DIAGNOSIS — Z85820 Personal history of malignant melanoma of skin: Secondary | ICD-10-CM

## 2020-10-17 DIAGNOSIS — C8590 Non-Hodgkin lymphoma, unspecified, unspecified site: Secondary | ICD-10-CM | POA: Diagnosis present

## 2020-10-17 LAB — CBC WITH DIFFERENTIAL/PLATELET
Abs Immature Granulocytes: 0.06 10*3/uL (ref 0.00–0.07)
Basophils Absolute: 0 10*3/uL (ref 0.0–0.1)
Basophils Relative: 0 %
Eosinophils Absolute: 0.1 10*3/uL (ref 0.0–0.5)
Eosinophils Relative: 1 %
HCT: 23.7 % — ABNORMAL LOW (ref 39.0–52.0)
Hemoglobin: 7 g/dL — ABNORMAL LOW (ref 13.0–17.0)
Immature Granulocytes: 1 %
Lymphocytes Relative: 13 %
Lymphs Abs: 1.2 10*3/uL (ref 0.7–4.0)
MCH: 22.7 pg — ABNORMAL LOW (ref 26.0–34.0)
MCHC: 29.5 g/dL — ABNORMAL LOW (ref 30.0–36.0)
MCV: 76.7 fL — ABNORMAL LOW (ref 80.0–100.0)
Monocytes Absolute: 0.7 10*3/uL (ref 0.1–1.0)
Monocytes Relative: 7 %
Neutro Abs: 7.7 10*3/uL (ref 1.7–7.7)
Neutrophils Relative %: 78 %
Platelets: 418 10*3/uL — ABNORMAL HIGH (ref 150–400)
RBC: 3.09 MIL/uL — ABNORMAL LOW (ref 4.22–5.81)
RDW: 17.9 % — ABNORMAL HIGH (ref 11.5–15.5)
WBC: 9.9 10*3/uL (ref 4.0–10.5)
nRBC: 0 % (ref 0.0–0.2)

## 2020-10-17 LAB — COMPREHENSIVE METABOLIC PANEL
ALT: 12 U/L (ref 0–44)
AST: 15 U/L (ref 15–41)
Albumin: 3.6 g/dL (ref 3.5–5.0)
Alkaline Phosphatase: 100 U/L (ref 38–126)
Anion gap: 12 (ref 5–15)
BUN: 88 mg/dL — ABNORMAL HIGH (ref 8–23)
CO2: 33 mmol/L — ABNORMAL HIGH (ref 22–32)
Calcium: 7.6 mg/dL — ABNORMAL LOW (ref 8.9–10.3)
Chloride: 88 mmol/L — ABNORMAL LOW (ref 98–111)
Creatinine, Ser: 2.25 mg/dL — ABNORMAL HIGH (ref 0.61–1.24)
GFR, Estimated: 28 mL/min — ABNORMAL LOW (ref >60)
Glucose, Bld: 126 mg/dL — ABNORMAL HIGH (ref 70–99)
Potassium: 3.5 mmol/L (ref 3.5–5.1)
Sodium: 133 mmol/L — ABNORMAL LOW (ref 135–145)
Total Bilirubin: 0.7 mg/dL (ref 0.3–1.2)
Total Protein: 6.8 g/dL (ref 6.5–8.1)

## 2020-10-17 LAB — RESP PANEL BY RT-PCR (FLU A&B, COVID) ARPGX2
Influenza A by PCR: NEGATIVE
Influenza B by PCR: NEGATIVE
SARS Coronavirus 2 by RT PCR: NEGATIVE

## 2020-10-17 LAB — POC OCCULT BLOOD, ED: Fecal Occult Bld: POSITIVE — AB

## 2020-10-17 LAB — HEMOGLOBIN A1C
Hgb A1c MFr Bld: 5.9 % — ABNORMAL HIGH (ref 4.8–5.6)
Mean Plasma Glucose: 122.63 mg/dL

## 2020-10-17 LAB — GLUCOSE, CAPILLARY: Glucose-Capillary: 85 mg/dL (ref 70–99)

## 2020-10-17 LAB — PREPARE RBC (CROSSMATCH)

## 2020-10-17 LAB — HEMOGLOBIN AND HEMATOCRIT, BLOOD
HCT: 24.7 % — ABNORMAL LOW (ref 39.0–52.0)
Hemoglobin: 7.1 g/dL — ABNORMAL LOW (ref 13.0–17.0)

## 2020-10-17 LAB — ABO/RH: ABO/RH(D): O POS

## 2020-10-17 MED ORDER — SELEXIPAG 1600 MCG PO TABS
1600.0000 ug | ORAL_TABLET | Freq: Two times a day (BID) | ORAL | Status: DC
  Administered 2020-10-18 – 2020-10-21 (×6): 1600 ug via ORAL
  Filled 2020-10-17 (×8): qty 1

## 2020-10-17 MED ORDER — SODIUM CHLORIDE 0.9% FLUSH
3.0000 mL | Freq: Two times a day (BID) | INTRAVENOUS | Status: DC
  Administered 2020-10-17 – 2020-10-21 (×8): 3 mL via INTRAVENOUS

## 2020-10-17 MED ORDER — MELATONIN 5 MG PO TABS
10.0000 mg | ORAL_TABLET | Freq: Every day | ORAL | Status: DC
  Administered 2020-10-17 – 2020-10-20 (×4): 10 mg via ORAL
  Filled 2020-10-17 (×5): qty 2

## 2020-10-17 MED ORDER — ATORVASTATIN CALCIUM 40 MG PO TABS
40.0000 mg | ORAL_TABLET | Freq: Every day | ORAL | Status: DC
  Administered 2020-10-18 – 2020-10-20 (×3): 40 mg via ORAL
  Filled 2020-10-17 (×3): qty 1

## 2020-10-17 MED ORDER — DILTIAZEM HCL ER COATED BEADS 120 MG PO CP24
120.0000 mg | ORAL_CAPSULE | Freq: Every day | ORAL | Status: DC
  Administered 2020-10-18 – 2020-10-21 (×2): 120 mg via ORAL
  Filled 2020-10-17 (×4): qty 1

## 2020-10-17 MED ORDER — SODIUM CHLORIDE 0.9% FLUSH: 3.0000 mL | INTRAVENOUS | Status: DC | PRN

## 2020-10-17 MED ORDER — INSULIN ASPART 100 UNIT/ML IJ SOLN: 0.0000 [IU] | Freq: Three times a day (TID) | INTRAMUSCULAR | Status: DC

## 2020-10-17 MED ORDER — PANTOPRAZOLE SODIUM 40 MG IV SOLR
40.0000 mg | Freq: Once | INTRAVENOUS | Status: AC
  Administered 2020-10-17: 40 mg via INTRAVENOUS
  Filled 2020-10-17: qty 40

## 2020-10-17 MED ORDER — TRAZODONE HCL 50 MG PO TABS
100.0000 mg | ORAL_TABLET | Freq: Every day | ORAL | Status: DC
  Administered 2020-10-17 – 2020-10-20 (×4): 100 mg via ORAL
  Filled 2020-10-17 (×4): qty 2

## 2020-10-17 MED ORDER — TRAMADOL HCL 50 MG PO TABS: 50.0000 mg | ORAL_TABLET | Freq: Four times a day (QID) | ORAL | Status: DC

## 2020-10-17 MED ORDER — SELEXIPAG 200 MCG PO TABS
400.0000 ug | ORAL_TABLET | Freq: Two times a day (BID) | ORAL | Status: DC
  Administered 2020-10-18 – 2020-10-21 (×6): 400 ug via ORAL
  Filled 2020-10-17 (×8): qty 2

## 2020-10-17 MED ORDER — SPIRONOLACTONE 12.5 MG HALF TABLET
12.5000 mg | ORAL_TABLET | Freq: Every morning | ORAL | Status: DC
  Administered 2020-10-18 – 2020-10-21 (×4): 12.5 mg via ORAL
  Filled 2020-10-17 (×4): qty 1

## 2020-10-17 MED ORDER — TORSEMIDE 20 MG PO TABS
100.0000 mg | ORAL_TABLET | Freq: Two times a day (BID) | ORAL | Status: DC
  Administered 2020-10-18 – 2020-10-20 (×6): 100 mg via ORAL
  Filled 2020-10-17 (×6): qty 5

## 2020-10-17 MED ORDER — ACETAMINOPHEN 325 MG PO TABS
650.0000 mg | ORAL_TABLET | Freq: Four times a day (QID) | ORAL | Status: DC | PRN
  Administered 2020-10-18 – 2020-10-21 (×3): 650 mg via ORAL
  Filled 2020-10-17 (×3): qty 2

## 2020-10-17 MED ORDER — ESCITALOPRAM OXALATE 10 MG PO TABS
10.0000 mg | ORAL_TABLET | Freq: Every day | ORAL | Status: DC
  Administered 2020-10-18 – 2020-10-21 (×4): 10 mg via ORAL
  Filled 2020-10-17 (×4): qty 1

## 2020-10-17 MED ORDER — SODIUM CHLORIDE 0.9 % IV SOLN: 250.0000 mL | INTRAVENOUS | Status: DC | PRN

## 2020-10-17 MED ORDER — TRAMADOL HCL 50 MG PO TABS
50.0000 mg | ORAL_TABLET | Freq: Two times a day (BID) | ORAL | Status: DC
  Administered 2020-10-17: 50 mg via ORAL
  Filled 2020-10-17: qty 1

## 2020-10-17 MED ORDER — METOLAZONE 2.5 MG PO TABS
2.5000 mg | ORAL_TABLET | ORAL | Status: DC
  Administered 2020-10-20: 2.5 mg via ORAL
  Filled 2020-10-17: qty 1

## 2020-10-17 MED ORDER — RIOCIGUAT 2.5 MG PO TABS: 2.5000 mg | ORAL_TABLET | Freq: Three times a day (TID) | ORAL | Status: DC

## 2020-10-17 MED ORDER — ROPINIROLE HCL 1 MG PO TABS
2.0000 mg | ORAL_TABLET | Freq: Three times a day (TID) | ORAL | Status: DC
  Administered 2020-10-17 – 2020-10-21 (×12): 2 mg via ORAL
  Filled 2020-10-17 (×12): qty 2

## 2020-10-17 MED ORDER — SODIUM CHLORIDE 0.9 % IV SOLN
10.0000 mL/h | Freq: Once | INTRAVENOUS | Status: AC
  Administered 2020-10-17: 10 mL/h via INTRAVENOUS

## 2020-10-17 MED ORDER — PANTOPRAZOLE SODIUM 40 MG IV SOLR
40.0000 mg | Freq: Two times a day (BID) | INTRAVENOUS | Status: DC
  Administered 2020-10-17 – 2020-10-21 (×8): 40 mg via INTRAVENOUS
  Filled 2020-10-17 (×8): qty 40

## 2020-10-17 MED ORDER — ACETAMINOPHEN 650 MG RE SUPP: 650.0000 mg | Freq: Four times a day (QID) | RECTAL | Status: DC | PRN

## 2020-10-17 NOTE — ED Notes (Signed)
Received verbal report from [place] B RN at this time

## 2020-10-17 NOTE — ED Provider Notes (Signed)
Emergency Medicine Provider Triage Evaluation Note  [name] , a 87 y.o. male  was evaluated in triage.  Pt complains of low hemoglobin.  Patient hemoglobin was 6.9, sent here by his doctor.  Patient does not check his stool, unclear if he is having any rectal bleeding.  No abdominal pain, no lightheadedness.  Patient is chronically short of breath on oxygen at home..  Patient is on Eliquis.  Review of Systems  Positive: Low hemoglobin Negative: Chest pain  Physical Exam  BP (!) 100/57   Pulse 88   Temp 98 F (36.7 C) (Oral)   Resp 18   SpO2 91%  Gen:   Awake, no distress   Resp:  Normal effort  MSK:   Moves extremities without difficulty  Other:  Abdomen is soft and nontender  Medical Decision Making  Medically screening exam initiated at 3:25 PM.  Appropriate orders placed.  [name] was informed that the remainder of the evaluation will be completed by another provider, this initial triage assessment does not replace that evaluation, and the importance of remaining in the ED until their evaluation is complete.  Rectal exam deferred.  Suspect likely GI bleeding given low hemoglobin.    Sherrill Raring, PA-C 10/17/20 1526    [name], MD 10/18/20 1806

## 2020-10-17 NOTE — ED Triage Notes (Signed)
Pt reports low hgb of 6.5, pt from [name], accompanied by wife. Pt denies any blood loss anywhere that he can tell. Pt denies SOB. Pt normally wears oxygen at home, 2L [place] applied in triage due to oxygen 91% in room air.

## 2020-10-17 NOTE — ED Provider Notes (Signed)
[facility] EMERGENCY DEPARTMENT Provider Note   CSN: [id number] Arrival date & time: 10/17/20  1428     History Chief Complaint  Patient presents with  . Anemia    Ryan [name] is a 87 y.o. male.   Anemia Associated symptoms include shortness of breath (Baseline). Pertinent negatives include no chest pain, no abdominal pain and no headaches. Patient presents for anemia.  This was discovered when his doctor check some lab work yesterday.  Hemoglobin was found to be in the range of 6.5.  Per chart review, patient's baseline hemoglobin is approximately 10.  He has not had any known sources of bleeding.  He is on Eliquis.  He does endorse the following symptoms of anemia: Extreme fatigue, and near syncopal episodes.  He denies any history of GI bleeding.  Medical history is notable for interstitial lung disease (on supplemental oxygen at baseline), diastolic CHF (on torsemide and metolazone).  He has had a colonoscopy in the past but estimates it was 10 years ago.  Findings were notable for polyps.  He is not sure why he is on Eliquis.     Past Medical History:  Diagnosis Date  . Asthma   . Barrett's esophagus   . Benign prostatic hypertrophy   . Bilateral lower extremity edema   . CHF (congestive heart failure) ([place])   . Chronic fatigue   . COPD (chronic obstructive pulmonary disease) ([place])   . Diabetes mellitus type 2 in nonobese ([place]) 04/24/2019  . Diverticulosis of colon (without mention of hemorrhage)   . Esophageal reflux   . Gastritis   . Gluten intolerance   . Hiatal hernia 02/10/11   Noted on CT Scan - Moderate Hiatal Hernia  . Hyperlipemia   . Hypertension   . 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 Melanoma ([place])    lymphoma  . Microcytic anemia 04/20/2011   . 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 Osteoarthritis    hands/feet,knees, NECK,  BACK  . Periaortic lymphadenopathy 02/16/2011  . Personal history of colonic polyps 2004   hyperplastic Dr. Collene Mares  . Pulmonary hyperinflation   . Renal cyst 02/10/11    Noted on CT Scan - Bilateral Renal Cysts  . S/P radiation therapy 03/15/11 - 04/09/11   Abdominal/ Pelvic Tumor, 3600 cGy/20 Fractions    Patient Active Problem List   Diagnosis Date Noted  . GI bleed 10/17/2020  . B12 deficiency 05/28/2019  . Cerebrovascular accident (CVA) due to embolism of precerebral artery ([place]) 05/28/2019  . Diabetic polyneuropathy associated with type 2 diabetes mellitus ([place]) 05/28/2019  . Acute CVA (cerebrovascular accident) ([place]) 04/24/2019  . Diabetes mellitus type 2 in nonobese ([place]) 04/24/2019  . Acute respiratory failure with hypoxia and hypercarbia ([place]) 09/20/2018  . CKD (chronic kidney disease) stage 3, GFR 30-59 ml/min ([facility]) 09/20/2018  . Lethargy 09/20/2018  . Acute metabolic encephalopathy 03/54/6568  . Chronic diastolic CHF (congestive heart failure) ([place]) 09/20/2018  . AKI (acute kidney injury) ([place]) 09/10/2018  . Pulmonary hypertension, unspecified ([place]) 09/10/2018  . Multifocal atrial tachycardia ([place]) 09/10/2018  . CHF exacerbation ([place]) 09/07/2018  . Leg pain 09/07/2018  . Hyperkalemia 07/26/2018  . Bilateral lower extremity edema 02/02/2017  . Pulmonary fibrosis ([place]) 11/22/2016  . Blurry vision, bilateral 10/26/2016  . COPD ([place]) 07/05/2016  . Exertional dyspnea 09/10/2015  . Fatigue/malaise 08/19/2015  . Asthma/COPD 02/12/2015  .  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 Chronic diastolic heart failure ([place]) 01/06/2014  . Hypoxemia 01/05/2014  . Essential hypertension 01/05/2014  . Hypokalemia 01/05/2014  . Urinary retention 01/05/2014  . Hypoxia   . Lumbar radiculopathy 12/31/2013  . RLS (restless legs syndrome) 09/21/2012  . Chronic bilateral low back pain with bilateral sciatica 06/15/2012  . Abdominal pain, unspecified site 05/19/2012  . Unspecified  deficiency anemia 04/07/2012  . Melanoma ([place])   . Non Hodgkin's lymphoma ([place])   . Periaortic lymphadenopathy 02/16/2011  . Barrett's esophagus 07/09/2010  . Personal history of colonic polyps 05/28/2010  . History of IBS 05/28/2010  . Diverticulosis of colon (without mention of hemorrhage) 05/28/2010  . Arthritis involving multiple sites 05/28/2010  . Wheat intolerance 05/28/2010  . Chronic venous insufficiency 08/19/2009  . Peripheral edema 08/19/2009  . INSOMNIA, CHRONIC 12/24/2008  . EUSTACHIAN TUBE DYSFUNCTION, BILATERAL 12/23/2008  . ERECTILE DYSFUNCTION, ORGANIC 08/01/2008  . Allergic rhinitis with a nonallergic component 05/20/2008  . VENTRAL HERNIA 02/12/2008  . PALPITATIONS 12/25/2007  . LACTOSE INTOLERANCE 10/17/2007  . Hyperlipidemia 09/14/2006  . BPH (benign prostatic hyperplasia) 09/14/2006  . OSTEOARTHRITIS 09/14/2006  . HEART MURMUR, HX OF 09/14/2006    Past Surgical History:  Procedure Laterality Date  . ARTHROSCOPIC REPAIR ACL     right  . BONE MARROW ASPIRATION  02/25/11   Bone Marrow, Aspirate, Clot, and Bilateral Bx, Right PIC  . CATARACT EXTRACTION, BILATERAL    . EYE SURGERY    . HERNIA REPAIR     LEFT INGUINAL   . INSERTION OF MESH N/A 08/23/2012   Procedure: INSERTION OF MESH;  Surgeon: Madilyn Hook, DO;  Location: [facility];  Service: General;  Laterality: N/A;  . LOOP RECORDER INSERTION N/A 04/26/2019   Procedure: LOOP RECORDER INSERTION;  Surgeon: Evans Lance, MD;  Location: [facility];  Service: Cardiovascular;  Laterality: N/A;  . LUMBAR LAMINECTOMY/DECOMPRESSION MICRODISCECTOMY Right 12/31/2013   Procedure: RIGHT L4-5 L5-S1 LAMINECTOMY;  Surgeon: Kristeen Miss, MD;  Location: [facility];  Service: Neurosurgery;  Laterality: Right;  RIGHT L4-5 L5-S1 LAMINECTOMY  . RIGHT HEART CATH N/A 07/25/2018   Procedure: RIGHT HEART CATH;  Surgeon: Larey Dresser, MD;  Location: [facility];  Service: Cardiovascular;  Laterality: N/A;  .  ROTATOR CUFF REPAIR     right  . TONSILLECTOMY    . TONSILLECTOMY    . VENTRAL HERNIA REPAIR N/A 08/23/2012   Procedure: HERNIA REPAIR VENTRAL ADULT;  Surgeon: Madilyn Hook, DO;  Location: [facility];  Service: General;  Laterality: N/A;       Family History  Problem Relation Age of Onset  . Heart disease Father        MI 68  . Urticaria Father   . Prostate cancer Brother   . Prostate cancer Paternal Uncle   . Prostate cancer Paternal Uncle   . Prostate cancer Paternal Uncle   . Hyperlipidemia Other   . Stroke Other   . Hypertension Other   . ADD / ADHD Other   . Colon cancer Neg Hx   . Allergic rhinitis Neg Hx   . Asthma Neg Hx   . Eczema Neg Hx     Social History   Tobacco Use  . Smoking status: Former    Packs/day: 1.00    Years: 35.00    Pack years: 35.00    Types: Pipe, Cigarettes    Quit date: 02/23/1992    Years since quitting: 28.6  .  Smokeless tobacco: Never  . Tobacco comments:    quit 20 years ago  Vaping Use  . Vaping Use: Never used  Substance Use Topics  . Alcohol use: Not Currently    Comment: 2 drinks daily scotch  AND WINE WITH SUPPER  . Drug use: No    Home Medications Prior to Admission medications   Medication Sig Start Date End Date Taking? Authorizing Provider  acetaminophen (TYLENOL) 500 MG tablet Take 500 mg by mouth every 6 (six) hours as needed for mild pain or headache.   Yes [provider]  atorvastatin (LIPITOR) 40 MG tablet Take 1 tablet (40 mg total) by mouth daily at 6 PM. 04/27/19  Yes Hosie Poisson, MD  cyanocobalamin (,VITAMIN B-12,) 1000 MCG/ML injection INJECT 1ML INTRAMUSCULARLY ONCE A WEEK FOR 3 WEEKS THEN MONTHLY THERAFTER Patient taking differently: Inject 1,000 mcg into the muscle every 30 (thirty) days. 09/03/20  Yes Ward Givens, NP  diltiazem (CARDIZEM CD) 120 MG 24 hr capsule Take 1 capsule (120 mg total) by mouth daily. 05/04/19  Yes Gonfa, Charlesetta Ivory, MD  ELIQUIS 2.5 MG TABS tablet TAKE ONE TABLET TWICE  DAILY Patient taking differently: Take 2.5 mg by mouth 2 (two) times daily. 07/14/20  Yes Larey Dresser, MD  escitalopram (LEXAPRO) 10 MG tablet Take 10 mg by mouth daily.   Yes [provider]  FEROSUL 325 (65 Fe) MG tablet Take 325 mg by mouth 2 (two) times daily with a meal.   Yes [provider]  finasteride (PROSCAR) 5 MG tablet Take 5 mg by mouth daily.   Yes [provider]  FLONASE SENSIMIST 27.5 MCG/SPRAY nasal spray Place 1 spray into the nose daily as needed for rhinitis or allergies.  12/11/18  Yes [provider]  Melatonin 10 MG TABS Take 10 mg by mouth at bedtime.   Yes [provider]  methylcellulose (CITRUCEL) oral powder Mix 2 grams in 8 ounces of water and drink twice a day. Patient taking differently: See admin instructions. Mix 2 grams of powder into 8 ounces of water and drink twice a day. 07/13/19  Yes Virgel Manifold, MD  metolazone (ZAROXOLYN) 2.5 MG tablet Take every other Monday Patient taking differently: Take 2.5 mg by mouth See admin instructions. Take 2.5 mg by mouth every other Monday 09/08/20  Yes [place], Maricela Bo, FNP  NON FORMULARY Take 1 tablet by mouth See admin instructions. Unnamed laxative: Take 1 tablet by mouth at bedtime as needed for iron-induced constipation   Yes [provider]  omeprazole (PRILOSEC) 20 MG capsule Take 20 mg by mouth daily.   Yes [provider]  Riociguat (ADEMPAS) 2.5 MG TABS Take 2.5 mg by mouth 3 (three) times daily. 10/15/20  Yes Larey Dresser, MD  rOPINIRole (REQUIP) 2 MG tablet Take 2 mg by mouth See admin instructions. Take 2 mg by mouth three times a day and an additional 2 mg once a day as needed for restless legs   Yes [provider]  spironolactone (ALDACTONE) 25 MG tablet Take 12.5 mg by mouth in the morning.   Yes [provider]  torsemide (DEMADEX) 20 MG tablet TAKE 5 TABLETS TWICE DAILY Patient taking differently: Take 100 mg by mouth 2  (two) times daily. 05/16/20  Yes Larey Dresser, MD  traMADol (ULTRAM) 50 MG tablet Take 50 mg by mouth every 6 (six) hours.   Yes [provider]  traZODone (DESYREL) 100 MG tablet Take 100 mg by mouth  at bedtime. 02/12/19  Yes [provider]  UPTRAVI 1600 MCG TABS Take 1,600 mcg by mouth 2 (two) times daily.   Yes [provider]  UPTRAVI 200 MCG TABS Take 400 mcg by mouth 2 (two) times daily.   Yes [provider]  eplerenone (INSPRA) 25 MG tablet Take 0.5 tablets (12.5 mg total) by mouth daily. Patient not taking: Reported on 10/17/2020 09/08/20   Rafael Bihari, FNP    Allergies    Pollen extract-tree extract and Molds & smuts  Review of Systems   Review of Systems  Constitutional:  Positive for fatigue. Negative for chills and fever.  HENT:  Negative for ear pain and sore throat.   Eyes:  Negative for pain and visual disturbance.  Respiratory:  Positive for shortness of breath (Baseline). Negative for cough and chest tightness.   Cardiovascular:  Positive for leg swelling (Baseline). Negative for chest pain and palpitations.  Gastrointestinal:  Negative for abdominal pain, blood in stool, constipation, diarrhea, nausea and vomiting.  Genitourinary:  Negative for decreased urine volume, dysuria and hematuria.  Musculoskeletal:  Negative for arthralgias, back pain, myalgias and neck pain.  Skin:  Negative for color change and rash.  Neurological:  Positive for light-headedness. Negative for seizures, facial asymmetry, speech difficulty, numbness and headaches. Syncope: Near syncope. Hematological:  Bruises/bleeds easily (On Eliquis).  All other systems reviewed and are negative.  Physical Exam Updated Vital Signs BP 98/62   Pulse 79   Temp 97.9 F (36.6 C) (Oral)   Resp 14   Ht _0  (1.702 m)   Wt 79.4 kg   SpO2 98%   BMI 27.41 kg/m   Physical Exam Vitals and nursing note reviewed.  Constitutional:      General: He is not in acute  distress.    Appearance: Normal appearance. He is well-developed and normal weight. He is not ill-appearing, toxic-appearing or diaphoretic.  HENT:     Head: Normocephalic and atraumatic.     Right Ear: External ear normal.     Left Ear: External ear normal.     Nose: Nose normal.     Mouth/Throat:     Mouth: Mucous membranes are moist.     Pharynx: Oropharynx is clear.  Eyes:     Conjunctiva/sclera: Conjunctivae normal.  Cardiovascular:     Rate and Rhythm: Normal rate and regular rhythm.     Heart sounds: No murmur heard. Pulmonary:     Effort: Pulmonary effort is normal. No respiratory distress.     Breath sounds: Decreased breath sounds present.  Abdominal:     Palpations: Abdomen is soft.     Tenderness: There is no abdominal tenderness.  Genitourinary:    Rectum: Normal. Guaiac result positive.  Musculoskeletal:     Cervical back: Normal range of motion and neck supple.     Right lower leg: Edema present.     Left lower leg: Edema present.  Skin:    General: Skin is warm and dry.     Coloration: Skin is not jaundiced or pale.  Neurological:     General: No focal deficit present.     Mental Status: He is alert and oriented to person, place, and time.     Cranial Nerves: No cranial nerve deficit.     Sensory: No sensory deficit.     Motor: No weakness.  Psychiatric:        Mood and Affect: Mood normal.        Behavior: Behavior  normal.    ED Results / Procedures / Treatments   Labs (all labs ordered are listed, but only abnormal results are displayed) Labs Reviewed  COMPREHENSIVE METABOLIC PANEL - Abnormal; Notable for the following components:      Result Value   Sodium 133 (*)    Chloride 88 (*)    CO2 33 (*)    Glucose, Bld 126 (*)    BUN 88 (*)    Creatinine, Ser 2.25 (*)    Calcium 7.6 (*)    GFR, Estimated 28 (*)    All other components within normal limits  CBC WITH DIFFERENTIAL/PLATELET - Abnormal; Notable for the following components:   RBC 3.09  (*)    Hemoglobin 7.0 (*)    HCT 23.7 (*)    MCV 76.7 (*)    MCH 22.7 (*)    MCHC 29.5 (*)    RDW 17.9 (*)    Platelets 418 (*)    All other components within normal limits  POC OCCULT BLOOD, ED - Abnormal; Notable for the following components:   Fecal Occult Bld POSITIVE (*)    All other components within normal limits  RESP PANEL BY RT-PCR (FLU A&B, COVID) ARPGX2  HEMOGLOBIN A1C  HEMOGLOBIN AND HEMATOCRIT, BLOOD  HEMOGLOBIN AND HEMATOCRIT, BLOOD  HEMOGLOBIN AND HEMATOCRIT, BLOOD  CBC  COMPREHENSIVE METABOLIC PANEL  TYPE AND SCREEN  ABO/RH  PREPARE RBC (CROSSMATCH)    EKG None  Radiology DG Chest Portable 1 View  Result Date: 10/17/2020 CLINICAL DATA:  Congestive heart failure. EXAM: PORTABLE CHEST 1 VIEW COMPARISON:  Chest x-ray 06/02/2019, CT chest 01/15/2021 FINDINGS: Azygous fissure incidentally again noted. The heart and mediastinal contours are unchanged. Aortic calcification. Redemonstration of lingular atelectasis in the setting of a large hiatal hernia. No focal consolidation. No pulmonary edema. No pleural effusion. No pneumothorax. No acute osseous abnormality. Right shoulder rotator cuff anchor suture. IMPRESSION: 1. Large hiatal hernia. 2. No acute cardiopulmonary abnormality. Electronically Signed   By: Iven Finn M.D.   On: 10/17/2020 17:44    Procedures Procedures   Medications Ordered in ED Medications  insulin aspart (novoLOG) injection 0-9 Units (has no administration in time range)  pantoprazole (PROTONIX) injection 40 mg (has no administration in time range)  sodium chloride flush (NS) 0.9 % injection 3 mL (has no administration in time range)  sodium chloride flush (NS) 0.9 % injection 3 mL (has no administration in time range)  0.9 %  sodium chloride infusion (has no administration in time range)  acetaminophen (TYLENOL) tablet 650 mg (has no administration in time range)    Or  acetaminophen (TYLENOL) suppository 650 mg (has no administration  in time range)  pantoprazole (PROTONIX) injection 40 mg (40 mg Intravenous Given 10/17/20 1842)  0.9 %  sodium chloride infusion (10 mL/hr Intravenous New Bag/Given 10/17/20 1841)    ED Course  I have reviewed the triage vital signs and the nursing notes.  Pertinent labs & imaging results that were available during my care of the patient were reviewed by me and considered in my medical decision making (see chart for details).    MDM Rules/Calculators/A&P                         CRITICAL CARE Performed by: Godfrey Pick   Total critical care time: 35 minutes  Critical care time was exclusive of separately billable procedures and treating other patients.  Critical care was necessary to treat or prevent imminent  or life-threatening deterioration.  Critical care was time spent personally by me on the following activities: development of treatment plan with patient and/or surrogate as well as nursing, discussions with consultants, evaluation of patient's response to treatment, examination of patient, obtaining history from patient or surrogate, ordering and performing treatments and interventions, ordering and review of laboratory studies, ordering and review of radiographic studies, pulse oximetry and re-evaluation of patient's condition.   Patient is 87 year old male presenting for symptomatic anemia.  Anemia confirmed on CBC in the ED (7.0 from baseline of 10).  Hemodynamically stable.  1 unit PRBCs ordered. On DRE, no gross blood but Hemoccult card was positive.  Suspect GI source.  Protonix given.  GI was consulted and recommends n.p.o. at midnight for possible scope tomorrow.  They stated that it would more likely be on Sunday.  Given his history of diastolic heart failure, will need gentle transfusion with possible diuresis.  Patient admitted to medicine for further management.  Final Clinical Impression(s) / ED Diagnoses Final diagnoses:  Symptomatic anemia    Rx / DC Orders ED  Discharge Orders     None        Godfrey Pick, MD 10/17/20 2015

## 2020-10-17 NOTE — H&P (Signed)
History and Physical    Ryan [name] GOT:[id number] DOB: 21-Nov-1931 DOA: 10/17/2020  PCP: Roetta Sessions, NP Consultants:  cardiology: Dr. Aundra Dubin, neurology: Dr. Jaynee Eagles, pulm: Dr. Vaughan Browner  [facility]: [facility]    Chief Complaint: anemia, sent by primary care   HPI: Ryan [name] is a 87 y.o. male with medical history significant of hypertension, hyperlipidemia, restless leg syndrome, Barrett's esophagus, CHF,pulmonary HTN, PAF on apixaban,Type 2 diabetes, chronic kidney disease stage III, hx of CVA, non-Hodgkin's lymphoma, BPH, chronic venous insufficiency, ILD on home oxygen of 2L who presented to ED for anemia. His doctor drew labs and found him to have anemia with hgb to 6.5. so he was sent to ED. He has been symptomatic with extreme fatigue for a few days. He is unsure if he has black or dark stool as he can't see very well. He denies any lightheaded or dizziness. Unsure if he is more short of breath. Denies any N/V/D.   Last colonoscopy maybe 10 years ago. He thinks it was okay. No family hx of colon cancer. He has had an EGD, but doesn't recall when this was.   He denies any fever/chills, headaches, vision changes, chest pain, palpitations, no increased leg swelling or weight gain.   Weight today: 175#.    ED Course: vitals: Afebrile, blood pressure 100/57, heart rate 88, respiratory rate 18, oxygen 91% on room air. Pertinent labs: Hemoglobin 7.0, BUN 88, creatinine 2.25, fecal occult positive Chest x-ray with no acute findings does have large hiatal hernia.  In ER given 40 mg of IV Protonix and orders written to transfuse 1 unit of PRBCs.  We were called and asked to admit  Review of Systems: As per HPI; otherwise review of systems reviewed and negative.   Ambulatory Status:  Ambulates with walker and electric scooter for distances    Past Medical History:  Diagnosis Date   Asthma    Barrett's esophagus    Benign prostatic  hypertrophy    Bilateral lower extremity edema    CHF (congestive heart failure) (HCC)    Chronic fatigue    COPD (chronic obstructive pulmonary disease) (HCC)    Diabetes mellitus type 2 in nonobese ([place]) 04/24/2019   Diverticulosis of colon (without mention of hemorrhage)    Esophageal reflux    Gastritis    Gluten intolerance    Hiatal hernia 02/10/11   Noted on CT Scan - Moderate Hiatal Hernia   Hyperlipemia    Hypertension    Lymphoma of lymph nodes in pelvis ([employer]) 03/03/2011    Large Right Retroperitoneal Mass   Melanoma ([place])    lymphoma   Microcytic anemia 04/20/2011    NHL (non-Hodgkin's 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 Collene Mares   Pulmonary hyperinflation    Renal cyst 02/10/11    Noted on CT Scan - Bilateral Renal Cysts   S/P radiation therapy 03/15/11 - 04/09/11   Abdominal/ Pelvic Tumor, 3600 cGy/20 Fractions    Past Surgical History:  Procedure Laterality Date   ARTHROSCOPIC REPAIR ACL     right   BONE MARROW ASPIRATION  02/25/11   Bone Marrow, Aspirate, Clot, and Bilateral Bx, Right PIC   CATARACT EXTRACTION, BILATERAL     EYE SURGERY     HERNIA REPAIR  LEFT INGUINAL    INSERTION OF MESH N/A 08/23/2012   Procedure: INSERTION OF MESH;  Surgeon: Madilyn Hook, DO;  Location: [facility];  Service: General;  Laterality: N/A;   LOOP RECORDER INSERTION N/A 04/26/2019   Procedure: LOOP RECORDER INSERTION;  Surgeon: Evans Lance, MD;  Location: [facility];  Service: Cardiovascular;  Laterality: N/A;   LUMBAR LAMINECTOMY/DECOMPRESSION MICRODISCECTOMY Right 12/31/2013   Procedure: RIGHT L4-5 L5-S1 LAMINECTOMY;  Surgeon: Kristeen Miss, MD;  Location: [facility];  Service: Neurosurgery;  Laterality: Right;  RIGHT L4-5 L5-S1 LAMINECTOMY   RIGHT HEART CATH N/A 07/25/2018   Procedure:  RIGHT HEART CATH;  Surgeon: Larey Dresser, MD;  Location: [facility];  Service: Cardiovascular;  Laterality: N/A;   ROTATOR CUFF REPAIR     right   TONSILLECTOMY     TONSILLECTOMY     VENTRAL HERNIA REPAIR N/A 08/23/2012   Procedure: HERNIA REPAIR VENTRAL ADULT;  Surgeon: Madilyn Hook, DO;  Location: [facility];  Service: General;  Laterality: N/A;    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  Tobacco Use   Smoking status: Former    Packs/day: 1.00    Years: 35.00    Pack years: 35.00    Types: Pipe, Cigarettes    Quit date: 02/23/1992    Years since quitting: 28.6   Smokeless tobacco: Never   Tobacco comments:    quit 20 years ago  Vaping Use   Vaping Use: Never used  Substance and Sexual Activity   Alcohol use: Not Currently    Comment: 2 drinks daily scotch  AND WINE WITH SUPPER   Drug use: No   Sexual activity: Not on file  Other Topics Concern   Not on file  Social History Narrative   Married - second marriage   He has two children   Retired Horticulturist, commercial Professor   Currently teaches pottery making, 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[facility]   Former Smoker quit 12 years ago- smoked for 35 years   Alcohol use- not currently   Right handed   Social Determinants of Radio broadcast assistant Strain: Not on file  Food Insecurity: Not on file  Transportation Needs: Not on file  Physical Activity: Not on file  Stress: Not on file  Social Connections: Not on file  Intimate Partner Violence: Not on file    Allergies  Allergen Reactions   Pollen Extract-Tree Extract Other (See Comments)    "HEADACHES, TIRED, [place]"   Molds & Smuts Other (See Comments)    Also dust mites causes sinus infections, h/a etc.     Family History  Problem Relation Age of Onset   Heart disease Father        MI 11   Urticaria Father    Prostate cancer Brother    Prostate cancer Paternal Uncle    Prostate cancer Paternal Uncle    Prostate cancer Paternal Uncle    Hyperlipidemia Other    Stroke Other    Hypertension Other    ADD / ADHD Other    Colon cancer Neg Hx    Allergic rhinitis Neg Hx    Asthma Neg Hx  Eczema Neg Hx     Prior to Admission medications   Medication Sig Start Date End Date Taking? Authorizing Provider  acetaminophen (TYLENOL) 500 MG tablet Take 500 mg by mouth every 6 (six) hours as needed for mild pain or headache.   Yes [provider]  atorvastatin (LIPITOR) 40 MG tablet Take 1 tablet (40 mg total) by mouth daily at 6 PM. 04/27/19  Yes Hosie Poisson, MD  cyanocobalamin (,VITAMIN B-12,) 1000 MCG/ML injection INJECT 1ML INTRAMUSCULARLY ONCE A WEEK FOR 3 WEEKS THEN MONTHLY THERAFTER Patient taking differently: Inject 1,000 mcg into the muscle every 30 (thirty) days. 09/03/20  Yes Ward Givens, NP  diltiazem (CARDIZEM CD) 120 MG 24 hr capsule Take 1 capsule (120 mg total) by mouth daily. 05/04/19  Yes Gonfa, Charlesetta Ivory, MD  ELIQUIS 2.5 MG TABS tablet TAKE ONE TABLET TWICE DAILY Patient taking differently: Take 2.5 mg by mouth 2 (two) times daily. 07/14/20  Yes Larey Dresser, MD  escitalopram (LEXAPRO) 10 MG tablet Take 10 mg by mouth daily.   Yes [provider]  FEROSUL 325 (65 Fe) MG tablet Take 325 mg by mouth 2 (two) times daily with a meal.   Yes [provider]  finasteride (PROSCAR) 5 MG tablet Take 5 mg by mouth daily.   Yes [provider]  FLONASE SENSIMIST 27.5 MCG/SPRAY nasal spray Place 1 spray into the nose daily as needed for rhinitis or allergies.  12/11/18  Yes [provider]  Melatonin 10 MG TABS Take 10 mg by mouth at bedtime.   Yes [provider]  methylcellulose (CITRUCEL) oral powder Mix 2 grams in 8 ounces of water  and drink twice a day. Patient taking differently: See admin instructions. Mix 2 grams of powder into 8 ounces of water and drink twice a day. 07/13/19  Yes Virgel Manifold, MD  metolazone (ZAROXOLYN) 2.5 MG tablet Take every other Monday Patient taking differently: Take 2.5 mg by mouth See admin instructions. Take 2.5 mg by mouth every other Monday 09/08/20  Yes [place], Maricela Bo, FNP  NON FORMULARY Take 1 tablet by mouth See admin instructions. Unnamed laxative: Take 1 tablet by mouth at bedtime as needed for iron-induced constipation   Yes [provider]  omeprazole (PRILOSEC) 20 MG capsule Take 20 mg by mouth daily.   Yes [provider]  Riociguat (ADEMPAS) 2.5 MG TABS Take 2.5 mg by mouth 3 (three) times daily. 10/15/20  Yes Larey Dresser, MD  rOPINIRole (REQUIP) 2 MG tablet Take 2 mg by mouth See admin instructions. Take 2 mg by mouth three times a day and an additional 2 mg once a day as needed for restless legs   Yes [provider]  spironolactone (ALDACTONE) 25 MG tablet Take 12.5 mg by mouth in the morning.   Yes [provider]  torsemide (DEMADEX) 20 MG tablet TAKE 5 TABLETS TWICE DAILY Patient taking differently: Take 100 mg by mouth 2 (two) times daily. 05/16/20  Yes Larey Dresser, MD  traMADol (ULTRAM) 50 MG tablet Take 50 mg by mouth every 6 (six) hours.   Yes [provider]  traZODone (DESYREL) 100 MG tablet Take 100 mg by mouth at bedtime. 02/12/19  Yes [provider]  UPTRAVI 1600 MCG TABS Take 1,600 mcg by mouth 2 (two) times daily.   Yes [provider]  UPTRAVI 200 MCG TABS Take 400 mcg by mouth 2 (two) times daily.   Yes [provider]  eplerenone (  INSPRA) 25 MG tablet Take 0.5 tablets (12.5 mg total) by mouth daily. Patient not taking: Reported on 10/17/2020 09/08/20   Rafael Bihari, FNP    Physical Exam: Vitals:   10/17/20 1649 10/17/20 1715 10/17/20 1745 10/17/20 1845  BP: 107/64 (!) 112/59  103/74 105/66  Pulse: 85 79 79 97  Resp: _0 (!) 42  Temp:      TempSrc:      SpO2: 96% 98% 98% 94%  Weight:      Height:         General:  Appears calm and comfortable and is in NAD Eyes:  PERRL, EOMI, normal lids, iris ENT:  grossly normal hearing, lips & tongue, mmm; no teeth on top and missing teeth on bottom  Neck:  no LAD, masses or thyromegaly; no carotid bruits Cardiovascular:  RRR, irregularly, irregular. Bilateral pitting edema to mid calf bilaterally. L>R.  Respiratory:   velcro like sounds in bilateral lung fields R>L. Decreased air movement in lung bases. No wheezes/rales/rhonchi.  Normal respiratory effort. Abdomen:  soft, NT, ND, NABS Back:   normal alignment, no CVAT Skin:  stasis dermatitis of bilateral lower extremities. L>R Musculoskeletal:  grossly normal tone BUE/BLE, good ROM, no bony abnormality  Limited foot exam with no ulcerations.  2+ distal pulses. Psychiatric:  grossly normal mood and affect, speech fluent and appropriate, AOx3 Neurologic:  CN 2-12 grossly intact, moves all extremities in coordinated fashion, sensation intact    Radiological Exams on Admission: Independently reviewed - see discussion in A/P where applicable  DG Chest Portable 1 View  Result Date: 10/17/2020 CLINICAL DATA:  Congestive heart failure. EXAM: PORTABLE CHEST 1 VIEW COMPARISON:  Chest x-ray 06/02/2019, CT chest 01/15/2021 FINDINGS: Azygous fissure incidentally again noted. The heart and mediastinal contours are unchanged. Aortic calcification. Redemonstration of lingular atelectasis in the setting of a large hiatal hernia. No focal consolidation. No pulmonary edema. No pleural effusion. No pneumothorax. No acute osseous abnormality. Right shoulder rotator cuff anchor suture. IMPRESSION: 1. Large hiatal hernia. 2. No acute cardiopulmonary abnormality. Electronically Signed   By: Iven Finn M.D.   On: 10/17/2020 17:44    EKG: Independently reviewed. Atrial fib with rate  81 with RBBB; nonspecific ST changes with no evidence of acute ischemia. I do see p waves, ? Sinus arrhythmia    Labs on Admission: I have personally reviewed the available labs and imaging studies at the time of the admission.  Pertinent labs:  Hemoglobin 7.0,  BUN 88,  creatinine 2.25,  fecal occult positive    Assessment/Plan Active Problems:   Acute on chronic anemia secondary to GI bleed -87 year old male presenting with acute on chronic anemia with positive fecal occult blood -Placed in progressive unit -IV Protonix BID -Receiving 1 unit of PRBCs.  Check H&H following this as he has diastolic CHF with pulmonary hypertension and do not want to overload him -Trend H&H's every 4 -transfuse to keep hemoglobin greater than 7 -GI consulted in ED, will see in AM  -Clear liquid diet will make n.p.o. at midnight -last colonoscopy 10+ years ago. EGD years ago. Known hx of barrett's.  -held oral iron    Barrett's esophagus -IV protonix while in hospital     Essential hypertension -Blood pressures quite soft, but at his baseline looking through OV notes.  -continue Cardizem and monitor      CKD (chronic kidney disease) stage 3, GFR 30-59 ml/min (HCC) vs. Acute on chronic kidney disease -creatinine has jumped around for  the past year. 6/21 creatinine: 2.2. 11/21: 2.11,  4/22: 2.15, 6/22: 1.88, 8/22: 1.94 and 09/18/20: 2.30 -unsure if baseline or AKI in setting of acute anemia -will continue his diuretics and spironolactone for now as his creatinine is actually improved from August when he saw his cardiologist -intake/output -follow closely     Chronic diastolic CHF/pulmonary HTN  Appears euvolemic on exam today and at his dry weigh of 175# per patient home scale.  Echo 6/22: EF 65-70% with mild LVH, normal RV, IVC normal.  On torsemide 133m BID and metolazone 2.519mevery 2 weeks  Sprinolactone 12.30m22maily  On uptravi and riociguat for pulmonary HTN Continue  medication Intake and output and daily weights  PAF -noted in ILR and in atrial fibrillation  on admit  -hold apixaban in setting of GI bleed -continue cardizem and telemetry     Diabetic polyneuropathy associated with type 2 diabetes mellitus (HCC) -a1c of 5.9 in 3/21, repeat pending  -tightly controlled on no medication  -SSI and accuchecks per protocol     Hyperlipidemia -continue statin     BPH (benign prostatic hyperplasia) -had proscar in his med list, but per his AL is not getting this.  -f/u with PCP regarding if supposed to be on this.     Non Hodgkin's lymphoma (HCC) -stable, thought to be in remission. (Dr. SheAmmie DaltonMild ILD on chronic 2L of oxygen (NSIP vs. UIP) -followed by pulm, on baseline 2L of oxygen      Body mass index is 27.41 kg/m.    Level of care: Progressive DVT prophylaxis:  SCDs Code Status:  Full - confirmed with patient Family Communication: None present; I spoke with the patient's wife, JanVinal Rosengrantby telephone at the time of admission. Disposition Plan:  The patient is from: an Assisted Living   Anticipated d/c is to: Assisted living  Requires inpatient hospitalization and is at significant risk of worsening, requires constant monitoring, assessment and MDM with specialists.  Patient is currently: acutely ill Consults called: GI  Admission status:  inpatient   Dragon dictation used in completing this note.     AllOrma Flaming [facility]   How to contact the TRHYuk[facility] - [facility] Hospitaltending or Consulting provider 7A [place] covering provider during after hours 7P [place] this patient?  Check the care team in [facility] look for a) attending/consulting TRH provider listed and b) the TRH[facility] LPam listed Log into [email or web address] and use [place]'s universal password to access. If you do not have the password, please contact the hospital operator. Locate the TRHMan[facility] Surgical Center LLCovider you are looking for under [facility] and page to a number that you can  be directly reached. If you still have difficulty reaching the provider, please page the DOCNorthern [facility] Centerirector on Call) for the Hospitalists listed on amion for assistance.   10/17/2020, 7:35 PM

## 2020-10-18 ENCOUNTER — Encounter (HOSPITAL_COMMUNITY): Payer: Self-pay | Admitting: Family Medicine

## 2020-10-18 DIAGNOSIS — I10 Essential (primary) hypertension: Secondary | ICD-10-CM | POA: Diagnosis not present

## 2020-10-18 DIAGNOSIS — I5032 Chronic diastolic (congestive) heart failure: Secondary | ICD-10-CM | POA: Diagnosis not present

## 2020-10-18 DIAGNOSIS — D649 Anemia, unspecified: Secondary | ICD-10-CM

## 2020-10-18 DIAGNOSIS — N1831 Chronic kidney disease, stage 3a: Secondary | ICD-10-CM | POA: Diagnosis not present

## 2020-10-18 DIAGNOSIS — E1142 Type 2 diabetes mellitus with diabetic polyneuropathy: Secondary | ICD-10-CM

## 2020-10-18 DIAGNOSIS — D509 Iron deficiency anemia, unspecified: Secondary | ICD-10-CM

## 2020-10-18 LAB — BPAM RBC
Blood Product Expiration Date: 202210172359
ISSUE DATE / TIME: 202209162155
Unit Type and Rh: 5100

## 2020-10-18 LAB — HEMOGLOBIN AND HEMATOCRIT, BLOOD
HCT: 23.9 % — ABNORMAL LOW (ref 39.0–52.0)
Hemoglobin: 7.3 g/dL — ABNORMAL LOW (ref 13.0–17.0)

## 2020-10-18 LAB — COMPREHENSIVE METABOLIC PANEL
ALT: 11 U/L (ref 0–44)
AST: 13 U/L — ABNORMAL LOW (ref 15–41)
Albumin: 3.1 g/dL — ABNORMAL LOW (ref 3.5–5.0)
Alkaline Phosphatase: 79 U/L (ref 38–126)
Anion gap: 13 (ref 5–15)
BUN: 73 mg/dL — ABNORMAL HIGH (ref 8–23)
CO2: 32 mmol/L (ref 22–32)
Calcium: 7.5 mg/dL — ABNORMAL LOW (ref 8.9–10.3)
Chloride: 92 mmol/L — ABNORMAL LOW (ref 98–111)
Creatinine, Ser: 1.83 mg/dL — ABNORMAL HIGH (ref 0.61–1.24)
GFR, Estimated: 35 mL/min — ABNORMAL LOW (ref >60)
Glucose, Bld: 78 mg/dL (ref 70–99)
Potassium: 3.2 mmol/L — ABNORMAL LOW (ref 3.5–5.1)
Sodium: 137 mmol/L (ref 135–145)
Total Bilirubin: 0.6 mg/dL (ref 0.3–1.2)
Total Protein: 5.8 g/dL — ABNORMAL LOW (ref 6.5–8.1)

## 2020-10-18 LAB — CBC
HCT: 22.9 % — ABNORMAL LOW (ref 39.0–52.0)
HCT: 24.6 % — ABNORMAL LOW (ref 39.0–52.0)
HCT: 28.1 % — ABNORMAL LOW (ref 39.0–52.0)
Hemoglobin: 7.1 g/dL — ABNORMAL LOW (ref 13.0–17.0)
Hemoglobin: 7.4 g/dL — ABNORMAL LOW (ref 13.0–17.0)
Hemoglobin: 8.5 g/dL — ABNORMAL LOW (ref 13.0–17.0)
MCH: 23.7 pg — ABNORMAL LOW (ref 26.0–34.0)
MCH: 23.4 pg — ABNORMAL LOW (ref 26.0–34.0)
MCH: 23.6 pg — ABNORMAL LOW (ref 26.0–34.0)
MCHC: 31 g/dL (ref 30.0–36.0)
MCHC: 30.1 g/dL (ref 30.0–36.0)
MCHC: 30.2 g/dL (ref 30.0–36.0)
MCV: 76.6 fL — ABNORMAL LOW (ref 80.0–100.0)
MCV: 77.8 fL — ABNORMAL LOW (ref 80.0–100.0)
MCV: 78.1 fL — ABNORMAL LOW (ref 80.0–100.0)
Platelets: 377 10*3/uL (ref 150–400)
Platelets: 382 10*3/uL (ref 150–400)
Platelets: 410 10*3/uL — ABNORMAL HIGH (ref 150–400)
RBC: 2.99 MIL/uL — ABNORMAL LOW (ref 4.22–5.81)
RBC: 3.16 MIL/uL — ABNORMAL LOW (ref 4.22–5.81)
RBC: 3.6 MIL/uL — ABNORMAL LOW (ref 4.22–5.81)
RDW: 17.9 % — ABNORMAL HIGH (ref 11.5–15.5)
RDW: 18.1 % — ABNORMAL HIGH (ref 11.5–15.5)
RDW: 18.3 % — ABNORMAL HIGH (ref 11.5–15.5)
WBC: 8.3 10*3/uL (ref 4.0–10.5)
WBC: 7.5 10*3/uL (ref 4.0–10.5)
WBC: 9.5 10*3/uL (ref 4.0–10.5)
nRBC: 0 % (ref 0.0–0.2)
nRBC: 0 % (ref 0.0–0.2)
nRBC: 0 % (ref 0.0–0.2)

## 2020-10-18 LAB — TYPE AND SCREEN
ABO/RH(D): O POS
Antibody Screen: NEGATIVE
Unit division: 0

## 2020-10-18 LAB — MRSA NEXT GEN BY PCR, NASAL: MRSA by PCR Next Gen: NOT DETECTED

## 2020-10-18 LAB — GLUCOSE, CAPILLARY
Glucose-Capillary: 92 mg/dL (ref 70–99)
Glucose-Capillary: 82 mg/dL (ref 70–99)
Glucose-Capillary: 92 mg/dL (ref 70–99)
Glucose-Capillary: 108 mg/dL — ABNORMAL HIGH (ref 70–99)

## 2020-10-18 MED ORDER — POLYETHYLENE GLYCOL 3350 17 G PO PACK: 17.0000 g | PACK | Freq: Every day | ORAL | Status: DC

## 2020-10-18 MED ORDER — POTASSIUM CHLORIDE 20 MEQ PO PACK
40.0000 meq | PACK | ORAL | Status: AC
  Administered 2020-10-18 (×2): 40 meq via ORAL
  Filled 2020-10-18 (×2): qty 2

## 2020-10-18 MED ORDER — SENNA 8.6 MG PO TABS
2.0000 | ORAL_TABLET | Freq: Every day | ORAL | Status: DC
  Administered 2020-10-18 – 2020-10-20 (×3): 17.2 mg via ORAL
  Filled 2020-10-18 (×3): qty 2

## 2020-10-18 MED ORDER — TRAMADOL HCL 50 MG PO TABS
50.0000 mg | ORAL_TABLET | Freq: Four times a day (QID) | ORAL | Status: DC | PRN
  Administered 2020-10-18 – 2020-10-21 (×8): 50 mg via ORAL
  Filled 2020-10-18 (×8): qty 1

## 2020-10-18 MED ORDER — POLYVINYL ALCOHOL 1.4 % OP SOLN
1.0000 [drp] | OPHTHALMIC | Status: DC | PRN
  Administered 2020-10-18: 1 [drp] via OPHTHALMIC
  Filled 2020-10-18: qty 15

## 2020-10-18 MED ORDER — POLYETHYLENE GLYCOL 3350 17 G PO PACK
17.0000 g | PACK | Freq: Two times a day (BID) | ORAL | Status: DC
  Administered 2020-10-18 – 2020-10-21 (×3): 17 g via ORAL
  Filled 2020-10-18 (×6): qty 1

## 2020-10-18 MED ORDER — BISACODYL 10 MG RE SUPP: 10.0000 mg | Freq: Every day | RECTAL | Status: DC | PRN

## 2020-10-18 MED ORDER — RIOCIGUAT 2.5 MG PO TABS
2.5000 mg | ORAL_TABLET | Freq: Three times a day (TID) | ORAL | Status: DC
  Administered 2020-10-18 – 2020-10-19 (×4): 2.5 mg via ORAL
  Filled 2020-10-18 (×7): qty 1

## 2020-10-18 NOTE — Progress Notes (Signed)
PROGRESS NOTE        PATIENT DETAILS Name: Ryan [name] Age: 87 y.o. Sex: male Date of Birth: 1931-06-21 Admit Date: 10/17/2020 Admitting Physician Orma Flaming, MD JJK:[id number], Rocco Pauls, NP  Brief Narrative: Patient is a 87 y.o. male with history of HTN, HLD, RLS, Barrett's esophagus, pulmonary hypertension, HFpEF, PAF on Eliquis, DM-2, CKD stage IIIa, non-Hodgkin's lymphoma (in remission) ILD with chronic hypoxic respiratory failure on home O2 2 L/minute-referred to the ED by PCP for a hemoglobin of 6.5.  See below for further details.  Subjective: Lying comfortably in bed-denies any chest pain or shortness of breath.  Claims his vision is not good but has not noticed any bloody stools-he does think he has had dark stools over the past few days.  Objective: Vitals: Blood pressure 91/62, pulse 68, temperature 98.2 F (36.8 C), temperature source Axillary, resp. rate 17, height _0  (1.702 m), weight 81.7 kg, SpO2 97 %.   Exam: Gen Exam:Alert awake-not in any distress HEENT:atraumatic, normocephalic Chest: B/L clear to auscultation anteriorly CVS:S1S2 regular Abdomen:soft non tender, non distended Extremities:no edema Neurology: Non focal Skin: no rash  Pertinent Labs/Radiology: WBC: 7.5 Hb: 7.4 Creatinine: 1.83 K: 3.2  CXR: Large hiatal hernia   Assessment/Plan: Severe microcytic anemia-rule out occult GI bleeding-FOBT positive: No overt GI bleeding noted-but patient with poor vision/poor historian.  S/p 1 unit of PRBC-hemoglobin stable at 7.4-plan is to follow CBC and transfuse if significant drop in hemoglobin.  GI following with plans for EGD evaluation tomorrow.  Continue PPI in the interim.  History of Barrett's esophagus: On PPI-EGD scheduled for tomorrow.  CKD stage IIIa: Close to baseline-watch closely as patient on diuretics.  HFpEF: Volume status stable-continue Demadex/metolazone/Aldactone  Pulmonary hypertension:  Continue selexipag and Adempas  PAF: Continue Cardizem-Eliquis on hold given severity of anemia.  DM-2 (A1c 5.9 on 9/16): CBG stable with SSI  Recent Labs    10/17/20 2228 10/18/20 0744 10/18/20 1225  GLUCAP 85 92 82    HLD: Continue statin  RLS: Continue Requip  Procedures :None Consults: GI DVT Prophylaxis: None Code Status:Full code Family Communication: None at bedside  Time spent: 35 minutes-Greater than 50% of this time was spent in counseling, explanation of diagnosis, planning of further management, and coordination of care.  Diet: Diet Order             Diet NPO time specified  Diet effective midnight           Diet clear liquid Room service appropriate? Yes; Fluid consistency: Thin  Diet effective now                      Disposition Plan: Status is: Inpatient  Remains inpatient appropriate because:Inpatient level of care appropriate due to severity of illness  Dispo: The patient is from: Home              Anticipated d/c is to: Home              Patient currently is not medically stable to d/c.   Difficult to place patient No    Barriers to Discharge: Severe anemia-concern for GI bleeding-EGD scheduled for tomorrow.  Antimicrobial agents: Anti-infectives (From admission, onward)    None        MEDICATIONS: Scheduled Meds:  atorvastatin  40 mg Oral q1800   diltiazem  120 mg Oral Daily   escitalopram  10 mg Oral Daily   insulin aspart  0-9 Units Subcutaneous TID WC   melatonin  10 mg Oral QHS   [START ON 10/20/2020] metolazone  2.5 mg Oral Q14 Days   pantoprazole (PROTONIX) IV  40 mg Intravenous Q12H   Riociguat  2.5 mg Oral TID   rOPINIRole  2 mg Oral TID   Selexipag  1,600 mcg Oral BID   Selexipag  400 mcg Oral BID   sodium chloride flush  3 mL Intravenous Q12H   spironolactone  12.5 mg Oral q AM   torsemide  100 mg Oral BID   traZODone  100 mg Oral QHS   Continuous Infusions:  sodium chloride     PRN Meds:.sodium  chloride, acetaminophen **OR** acetaminophen, polyvinyl alcohol, sodium chloride flush, traMADol   I have personally reviewed following labs and imaging studies  LABORATORY DATA: CBC: Recent Labs  Lab 10/17/20 1526 10/17/20 1821 10/18/20 0344 10/18/20 0348 10/18/20 0754  WBC 9.9  --   --  8.3 7.5  NEUTROABS 7.7  --   --   --   --   HGB 7.0* 7.1* 7.3* 7.1* 7.4*  HCT 23.7* 24.7* 23.9* 22.9* 24.6*  MCV 76.7*  --   --  76.6* 77.8*  PLT 418*  --   --  377 734    Basic Metabolic Panel: Recent Labs  Lab 10/17/20 1526 10/18/20 0348  NA 133* 137  K 3.5 3.2*  CL 88* 92*  CO2 33* 32  GLUCOSE 126* 78  BUN 88* 73*  CREATININE 2.25* 1.83*  CALCIUM 7.6* 7.5*    GFR: Estimated Creatinine Clearance: 29.6 mL/min (A) (by C-G formula based on SCr of 1.83 mg/dL (H)).  Liver Function Tests: Recent Labs  Lab 10/17/20 1526 10/18/20 0348  AST 15 13*  ALT 12 11  ALKPHOS 100 79  BILITOT 0.7 0.6  PROT 6.8 5.8*  ALBUMIN 3.6 3.1*   No results for input(s): LIPASE, AMYLASE in the last 168 hours. No results for input(s): AMMONIA in the last 168 hours.  Coagulation Profile: No results for input(s): INR, PROTIME in the last 168 hours.  Cardiac Enzymes: No results for input(s): CKTOTAL, CKMB, CKMBINDEX, TROPONINI in the last 168 hours.  BNP (last 3 results) No results for input(s): PROBNP in the last 8760 hours.  Lipid Profile: No results for input(s): CHOL, HDL, LDLCALC, TRIG, CHOLHDL, LDLDIRECT in the last 72 hours.  Thyroid Function Tests: No results for input(s): TSH, T4TOTAL, FREET4, T3FREE, THYROIDAB in the last 72 hours.  Anemia Panel: No results for input(s): VITAMINB12, FOLATE, FERRITIN, TIBC, IRON, RETICCTPCT in the last 72 hours.  Urine analysis:    Component Value Date/Time   COLORURINE STRAW (A) 07/13/2019 1041   APPEARANCEUR CLEAR 07/13/2019 1041   LABSPEC 1.008 07/13/2019 1041   LABSPEC 1.025 06/28/2013 1321   PHURINE 6.0 07/13/2019 1041   GLUCOSEU  NEGATIVE 07/13/2019 1041   GLUCOSEU Negative 06/28/2013 1321   HGBUR NEGATIVE 07/13/2019 1041   BILIRUBINUR NEGATIVE 07/13/2019 1041   BILIRUBINUR Negative 06/28/2013 1321   KETONESUR NEGATIVE 07/13/2019 1041   PROTEINUR NEGATIVE 07/13/2019 1041   UROBILINOGEN 1.0 01/04/2014 2156   UROBILINOGEN 0.2 06/28/2013 1321   NITRITE NEGATIVE 07/13/2019 1041   LEUKOCYTESUR NEGATIVE 07/13/2019 1041   LEUKOCYTESUR Negative 06/28/2013 1321    Sepsis Labs: Lactic Acid, Venous    Component Value Date/Time   LATICACIDVEN 0.6 04/30/2019 1424    MICROBIOLOGY: Recent Results (from the past  240 hour(s))  Resp Panel by RT-PCR (Flu A&B, Covid) Nasopharyngeal Swab     Status: None   Collection Time: 10/17/20  5:13 PM   Specimen: Nasopharyngeal Swab; Nasopharyngeal(NP) swabs in vial transport medium  Result Value Ref Range Status   SARS Coronavirus 2 by RT PCR NEGATIVE NEGATIVE Final    Comment: (NOTE) SARS-CoV-2 target nucleic acids are NOT DETECTED.  The SARS-CoV-2 RNA is generally detectable in upper respiratory specimens during the acute phase of infection. The lowest concentration of SARS-CoV-2 viral copies this assay can detect is 138 copies/mL. A negative result does not preclude SARS-Cov-2 infection and should not be used as the sole basis for treatment or other patient management decisions. A negative result may occur with  improper specimen collection/handling, submission of specimen other than nasopharyngeal swab, presence of viral mutation(s) within the areas targeted by this assay, and inadequate number of viral copies(<138 copies/mL). A negative result must be combined with clinical observations, patient history, and epidemiological information. The expected result is Negative.  Fact Sheet for Patients:  EntrepreneurPulse.com.au  Fact Sheet for Healthcare Providers:  IncredibleEmployment.be  This test is no t yet approved or cleared by the  [place] FDA and  has been authorized for detection and/or diagnosis of SARS-CoV-2 by FDA under an Emergency Use Authorization (EUA). This EUA will remain  in effect (meaning this test can be used) for the duration of the COVID-19 declaration under Section 564(b)(1) of the Act, 21 U.S.C.section 360bbb-3(b)(1), unless the authorization is terminated  or revoked sooner.       Influenza A by PCR NEGATIVE NEGATIVE Final   Influenza B by PCR NEGATIVE NEGATIVE Final    Comment: (NOTE) The Xpert Xpress SARS-CoV-2/FLU/RSV plus assay is intended as an aid in the diagnosis of influenza from Nasopharyngeal swab specimens and should not be used as a sole basis for treatment. Nasal washings and aspirates are unacceptable for Xpert Xpress SARS-CoV-2/FLU/RSV testing.  Fact Sheet for Patients: EntrepreneurPulse.com.au  Fact Sheet for Healthcare Providers: IncredibleEmployment.be  This test is not yet approved or cleared by the [place] FDA and has been authorized for detection and/or diagnosis of SARS-CoV-2 by FDA under an Emergency Use Authorization (EUA). This EUA will remain in effect (meaning this test can be used) for the duration of the COVID-19 declaration under Section 564(b)(1) of the Act, 21 U.S.C. section 360bbb-3(b)(1), unless the authorization is terminated or revoked.  Performed at [facility], [place] [address] [place], [place] [address]   MRSA Next Gen by PCR, Nasal     Status: None   Collection Time: 10/18/20  5:22 AM   Specimen: Nasal Mucosa; Nasal Swab  Result Value Ref Range Status   MRSA by PCR Next Gen NOT DETECTED NOT DETECTED Final    Comment: (NOTE) The GeneXpert MRSA Assay (FDA approved for NASAL specimens only), is one component of a comprehensive MRSA colonization surveillance program. It is not intended to diagnose MRSA infection nor to guide or monitor treatment for MRSA infections. Test performance is not  FDA approved in patients less than 59 years old. Performed at [facility], [place] [address] [place], [place] [address]     RADIOLOGY STUDIES/RESULTS: DG Chest Portable 1 View  Result Date: 10/17/2020 CLINICAL DATA:  Congestive heart failure. EXAM: PORTABLE CHEST 1 VIEW COMPARISON:  Chest x-ray 06/02/2019, CT chest 01/15/2021 FINDINGS: Azygous fissure incidentally again noted. The heart and mediastinal contours are unchanged. Aortic calcification. Redemonstration of lingular atelectasis in the setting of  a large hiatal hernia. No focal consolidation. No pulmonary edema. No pleural effusion. No pneumothorax. No acute osseous abnormality. Right shoulder rotator cuff anchor suture. IMPRESSION: 1. Large hiatal hernia. 2. No acute cardiopulmonary abnormality. Electronically Signed   By: Iven Finn M.D.   On: 10/17/2020 17:44     LOS: 1 day   Oren Binet, MD  [facility]    To contact the attending provider between 7A-7P or the covering provider during after hours 7P-7A, please log into the web site [email or web address] and access using universal [facility] password for that web site. If you do not have the password, please call the hospital operator.  10/18/2020, 1:48 PM

## 2020-10-18 NOTE — Progress Notes (Signed)
Patient's wife brought in home medications.  RN and patient counted medications and RN brought medications to the pharmacy so they can be dispensed and administered to the patient accordingly.  Adempas (riociguat) 2.5 mg Tablets -- Count 4 Uptravi (Selexipag) 1600 mcg Tablets - count 25 Uptravi (Selexipag) 200 mcg Tablets - count 53

## 2020-10-18 NOTE — H&P (View-Only) (Signed)
Referring Provider:  [facility]         Primary Care Physician:  Roetta Sessions, NP Primary Gastroenterologist:  Dr. Sharlett Iles ( retired)  Reason for Consultation:   anemia              ASSESSMENT / PLAN   # 87 yo male with acute on chronic iron deficiency anemia in setting of CKD.  FOBT + on Eliquis. He is unsure of any overt GI blood loss ( poor vision). Chronic anemia may have worsen because he has been off of  oral iron for several months ( just restarted it two days ago). He has a large hiatal hernia ( CT chest Dec 2021) and could have associated Cameron's lesions leading to chronic occult GI blood loss. Other etiologies such as erosive disease, PUD, AVMs, or intestinal neoplasm are other considerations.   --Will start with an EGD in am. The risks and benefits of EGD with possible biopsies were discussed with the patient who agrees to proceed. The need for colonoscopy will depending on EGD findings. We did discuss colonoscopy and he would be willing to proceed.  --Hgb stable at 7.4 (but only up 0.4 grams after 1 u RPBC)  # Large hiatal hernia with predominantly intrathoracic stomach in the lower left hemithorax CT scan .   # History of Barrett's esophagus ( remote). On daily PPI  # A-Fibrillation, on Eliquis ( on hold)   # See additional medical problems below  HISTORY OF PRESENT ILLNESS                                                                                                                         Chief Complaint: anemia  Ryan [name] is a 87 y.o. male with a past medical history significant for hypertension, hyperlipidemia, Barrett's esophagus, severe diverticulosis, CHF, pulmonary hypertension, PAF on Eliquis, DM2, CKD 3, history of CVA, remote non-Hodgkin's lymphoma, BPH, interstitial lung disease on home O2.  See PMH for any additional medical history.    ED course:  Patient's PCP sent him to ED yesterday for low hemoglobin. He lives at  [facility].  In the ED his hemoglobin was 7 down from baseline of mid 9 to mid 10. MCV 77.8, creatinine 1.8, BUN 73. FOBT +.   Patient says his vision is not good so he cannot say for sure if he has had any overt GI bleeding. He stopped taking oral iron several months ago but restarted it two days ago. He has chronic constipation, no other GI complaints. Specifically no abdominal pain , nausea, vomiting or unexplained weight loss.   PREVIOUS ENDOSCOPIC EVALUATIONS   2004 colonoscopy  Several small polyps ( 3 biopsied and one removed). Path c/w hyperplastic polyps   Past Medical History:  Diagnosis Date   Asthma    Barrett's esophagus    Benign prostatic hypertrophy    Bilateral lower extremity edema    CHF (congestive heart failure) (HCC)    Chronic  fatigue    COPD (chronic obstructive pulmonary disease) (HCC)    Diabetes mellitus type 2 in nonobese ([place]) 04/24/2019   Diverticulosis of colon (without mention of hemorrhage)    Esophageal reflux    Gastritis    Gluten intolerance    Hiatal hernia 02/10/11   Noted on CT Scan - Moderate Hiatal Hernia   Hyperlipemia    Hypertension    Lymphoma of lymph nodes in pelvis ([place]) 03/03/2011    Large Right Retroperitoneal Mass   Melanoma ([place])    lymphoma   Microcytic anemia 04/20/2011    NHL (non-Hodgkin's 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 Collene Mares   Pulmonary hyperinflation    Renal cyst 02/10/11    Noted on CT Scan - Bilateral Renal Cysts   S/P radiation therapy 03/15/11 - 04/09/11   Abdominal/ Pelvic Tumor, 3600 cGy/20 Fractions    Past Surgical History:  Procedure Laterality Date   ARTHROSCOPIC REPAIR ACL     right   BONE MARROW ASPIRATION  02/25/11   Bone Marrow, Aspirate, Clot, and Bilateral Bx, Right PIC   CATARACT EXTRACTION, BILATERAL     EYE  SURGERY     HERNIA REPAIR     LEFT INGUINAL    INSERTION OF MESH N/A 08/23/2012   Procedure: INSERTION OF MESH;  Surgeon: Madilyn Hook, DO;  Location: [facility];  Service: General;  Laterality: N/A;   LOOP RECORDER INSERTION N/A 04/26/2019   Procedure: LOOP RECORDER INSERTION;  Surgeon: Evans Lance, MD;  Location: [facility];  Service: Cardiovascular;  Laterality: N/A;   LUMBAR LAMINECTOMY/DECOMPRESSION MICRODISCECTOMY Right 12/31/2013   Procedure: RIGHT L4-5 L5-S1 LAMINECTOMY;  Surgeon: Kristeen Miss, MD;  Location: [place];  Service: Neurosurgery;  Laterality: Right;  RIGHT L4-5 L5-S1 LAMINECTOMY   RIGHT HEART CATH N/A 07/25/2018   Procedure: RIGHT HEART CATH;  Surgeon: Larey Dresser, MD;  Location: [facility];  Service: Cardiovascular;  Laterality: N/A;   ROTATOR CUFF REPAIR     right   TONSILLECTOMY     TONSILLECTOMY     VENTRAL HERNIA REPAIR N/A 08/23/2012   Procedure: HERNIA REPAIR VENTRAL ADULT;  Surgeon: Madilyn Hook, DO;  Location: [facility];  Service: General;  Laterality: N/A;    Prior to Admission medications   Medication Sig Start Date End Date Taking? Authorizing Provider  acetaminophen (TYLENOL) 500 MG tablet Take 500 mg by mouth every 6 (six) hours as needed for mild pain or headache.   Yes [provider]  atorvastatin (LIPITOR) 40 MG tablet Take 1 tablet (40 mg total) by mouth daily at 6 PM. 04/27/19  Yes Hosie Poisson, MD  cyanocobalamin (,VITAMIN B-12,) 1000 MCG/ML injection INJECT 1ML INTRAMUSCULARLY ONCE A WEEK FOR 3 WEEKS THEN MONTHLY THERAFTER Patient taking differently: Inject 1,000 mcg into the muscle every 30 (thirty) days. 09/03/20  Yes Ward Givens, NP  diltiazem (CARDIZEM CD) 120 MG 24 hr capsule Take 1 capsule (120 mg total) by mouth daily. 05/04/19  Yes Gonfa, Charlesetta Ivory, MD  ELIQUIS 2.5 MG TABS tablet TAKE ONE TABLET TWICE DAILY Patient taking differently: Take 2.5 mg by mouth 2 (two) times daily. 07/14/20  Yes Larey Dresser, MD   escitalopram (LEXAPRO) 10 MG tablet Take 10 mg by mouth daily.   Yes [provider]  Caroleen Hamman  325 (65 Fe) MG tablet Take 325 mg by mouth 2 (two) times daily with a meal.   Yes [provider]  finasteride (PROSCAR) 5 MG tablet Take 5 mg by mouth daily.   Yes [provider]  FLONASE SENSIMIST 27.5 MCG/SPRAY nasal spray Place 1 spray into the nose daily as needed for rhinitis or allergies.  12/11/18  Yes [provider]  Melatonin 10 MG TABS Take 10 mg by mouth at bedtime.   Yes [provider]  methylcellulose (CITRUCEL) oral powder Mix 2 grams in 8 ounces of water and drink twice a day. Patient taking differently: See admin instructions. Mix 2 grams of powder into 8 ounces of water and drink twice a day. 07/13/19  Yes Virgel Manifold, MD  metolazone (ZAROXOLYN) 2.5 MG tablet Take every other Monday Patient taking differently: Take 2.5 mg by mouth See admin instructions. Take 2.5 mg by mouth every other Monday 09/08/20  Yes [place], Maricela Bo, FNP  NON FORMULARY Take 1 tablet by mouth See admin instructions. Unnamed laxative: Take 1 tablet by mouth at bedtime as needed for iron-induced constipation   Yes [provider]  omeprazole (PRILOSEC) 20 MG capsule Take 20 mg by mouth daily.   Yes [provider]  Riociguat (ADEMPAS) 2.5 MG TABS Take 2.5 mg by mouth 3 (three) times daily. 10/15/20  Yes Larey Dresser, MD  rOPINIRole (REQUIP) 2 MG tablet Take 2 mg by mouth See admin instructions. Take 2 mg by mouth three times a day and an additional 2 mg once a day as needed for restless legs   Yes [provider]  spironolactone (ALDACTONE) 25 MG tablet Take 12.5 mg by mouth in the morning.   Yes [provider]  torsemide (DEMADEX) 20 MG tablet TAKE 5 TABLETS TWICE DAILY Patient taking differently: Take 100 mg by mouth 2 (two) times daily. 05/16/20  Yes Larey Dresser, MD  traMADol (ULTRAM) 50 MG tablet Take 50 mg by mouth every  6 (six) hours.   Yes [provider]  traZODone (DESYREL) 100 MG tablet Take 100 mg by mouth at bedtime. 02/12/19  Yes [provider]  UPTRAVI 1600 MCG TABS Take 1,600 mcg by mouth 2 (two) times daily.   Yes [provider]  UPTRAVI 200 MCG TABS Take 400 mcg by mouth 2 (two) times daily.   Yes [provider]  eplerenone (INSPRA) 25 MG tablet Take 0.5 tablets (12.5 mg total) by mouth daily. Patient not taking: Reported on 10/17/2020 09/08/20   Rafael Bihari, FNP    Current Facility-Administered Medications  Medication Dose Route Frequency Provider Last Rate Last Admin   0.9 %  sodium chloride infusion  250 mL Intravenous PRN Orma Flaming, MD       acetaminophen (TYLENOL) tablet 650 mg  650 mg Oral Q6H PRN Orma Flaming, MD   650 mg at 10/18/20 0700   Or   acetaminophen (TYLENOL) suppository 650 mg  650 mg Rectal Q6H PRN Orma Flaming, MD       atorvastatin (LIPITOR) tablet 40 mg  40 mg Oral q1800 Orma Flaming, MD       diltiazem (CARDIZEM CD) 24 hr capsule 120 mg  120 mg Oral Daily Orma Flaming, MD   120 mg at 10/18/20 1009   escitalopram (LEXAPRO) tablet 10 mg  10 mg Oral Daily Orma Flaming, MD   10 mg at 10/18/20 1009   insulin aspart (novoLOG) injection 0-9 Units  0-9 Units Subcutaneous TID  WC Orma Flaming, MD       melatonin tablet 10 mg  10 mg Oral QHS Orma Flaming, MD   10 mg at 10/17/20 2215   [START ON 10/20/2020] metolazone (ZAROXOLYN) tablet 2.5 mg  2.5 mg Oral Q14 Days Orma Flaming, MD       pantoprazole (PROTONIX) injection 40 mg  40 mg Intravenous Q12H Orma Flaming, MD   40 mg at 10/18/20 1014   polyvinyl alcohol (LIQUIFILM TEARS) 1.4 % ophthalmic solution 1 drop  1 drop Both Eyes PRN Orma Flaming, MD   1 drop at 10/18/20 0542   Riociguat TABS 2.5 mg  2.5 mg Oral TID Orma Flaming, MD       rOPINIRole (REQUIP) tablet 2 mg  2 mg Oral TID Orma Flaming, MD   2 mg at 10/18/20 0654   Selexipag TABS 1,600 mcg  1,600 mcg Oral  BID Orma Flaming, MD       Selexipag TABS 400 mcg  400 mcg Oral BID Orma Flaming, MD       sodium chloride flush (NS) 0.9 % injection 3 mL  3 mL Intravenous Q12H Orma Flaming, MD   3 mL at 10/18/20 1014   sodium chloride flush (NS) 0.9 % injection 3 mL  3 mL Intravenous PRN Orma Flaming, MD       spironolactone (ALDACTONE) tablet 12.5 mg  12.5 mg Oral q AM Orma Flaming, MD   12.5 mg at 10/18/20 1008   torsemide (DEMADEX) tablet 100 mg  100 mg Oral BID Orma Flaming, MD   100 mg at 10/18/20 1008   traMADol (ULTRAM) tablet 50 mg  50 mg Oral Q6H PRN Jonetta Osgood, MD   50 mg at 10/18/20 0805   traZODone (DESYREL) tablet 100 mg  100 mg Oral QHS Orma Flaming, MD   100 mg at 10/17/20 2215    Allergies as of 10/17/2020 - Review Complete 10/17/2020  Allergen Reaction Noted   Pollen extract-tree extract Other (See Comments) 04/30/2011   Molds & smuts Other (See Comments) 02/17/2011    Family History  Problem Relation Age of Onset   Heart disease Father        MI 59   Urticaria Father    Prostate cancer Brother    Prostate cancer Paternal Uncle    Prostate cancer Paternal Uncle    Prostate cancer Paternal Uncle    Hyperlipidemia Other    Stroke Other    Hypertension Other    ADD / ADHD Other    Colon cancer Neg Hx    Allergic rhinitis Neg Hx    Asthma Neg Hx    Eczema Neg Hx     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[employer]  Tobacco Use   Smoking status: Former    Packs/day: 1.00    Years: 35.00    Pack years: 35.00    Types: Pipe, Cigarettes    Quit date: 02/23/1992    Years since quitting: 28.6   Smokeless tobacco: Never   Tobacco comments:    quit 20 years ago  Vaping Use   Vaping Use: Never used  Substance and Sexual  Activity   Alcohol use: Not Currently    Comment:  2 drinks daily scotch  AND WINE WITH SUPPER   Drug use: No   Sexual activity: Not on file  Other Topics Concern   Not on file  Social History Narrative   Married - second marriage   He has two children   Retired Horticulturist, commercial Professor   Currently teaches pottery making, 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[facility]   Former Smoker quit 12 years ago- smoked for 35 years   Alcohol use- not currently   Right handed   Social Determinants of Radio broadcast assistant Strain: Not on file  Food Insecurity: Not on file  Transportation Needs: Not on file  Physical Activity: Not on file  Stress: Not on file  Social Connections: Not on file  Intimate Partner Violence: Not on file    Review of Systems: Positive for fatigue over last several months. Positive for SOB for a few months but mainly when not wearing oxygen. All other systems reviewed and negative except where noted in HPI.    OBJECTIVE    Physical Exam: Vital signs in last 24 hours: Temp:  [97 F (36.1 C)-98.2 F (36.8 C)] 98.2 F (36.8 C) (09/17 0400) Pulse Rate:  [76-97] 76 (09/17 0800) Resp:  [12-42] 15 (09/17 0800) BP: (89-113)/(53-84) 89/60 (09/17 0800) SpO2:  [89 %-98 %] 91 % (09/17 0800) Weight:  [79.1 kg-81.7 kg] 81.7 kg (09/17 0500)   General:   Alert  male in NAD in bedside chair Psych:  Pleasant, cooperative. Normal mood and affect. Eyes:  Pupils equal, sclera clear, no icterus.   Conjunctiva pink. Ears:  Normal auditory acuity. Nose:  No deformity, discharge,  or lesions. Neck:  Supple; no masses Lungs:  Clear throughout to auscultation. "Velcro" type sounds at both bases.  Heart:  Regular rate, 3+ BLE edema Abdomen:  Limited exam in bedside chair. Abdomen soft, non-distended, nontender, BS active,  area of superficial fullness just right of umbilicus.   Rectal:  Deferred  Msk:  Symmetrical without gross deformities. . Neurologic:  Alert and  oriented x4;   grossly normal neurologically. Skin:  Intact without significant lesions or rashes.   Scheduled inpatient medications  atorvastatin  40 mg Oral q1800   diltiazem  120 mg Oral Daily   escitalopram  10 mg Oral Daily   insulin aspart  0-9 Units Subcutaneous TID WC   melatonin  10 mg Oral QHS   [START ON 10/20/2020] metolazone  2.5 mg Oral Q14 Days   pantoprazole (PROTONIX) IV  40 mg Intravenous Q12H   Riociguat  2.5 mg Oral TID   rOPINIRole  2 mg Oral TID   Selexipag  1,600 mcg Oral BID   Selexipag  400 mcg Oral BID   sodium chloride flush  3 mL Intravenous Q12H   spironolactone  12.5 mg Oral q AM   torsemide  100 mg Oral BID   traZODone  100 mg Oral QHS      Intake/Output from previous day: 09/16 0701 - 09/17 0700 In: 377.9 [Blood:377.9] Out: 1150 [Urine:1150] Intake/Output this shift: Total I/O In: -  Out: 575 [Urine:575]   Lab Results: Recent Labs    10/17/20 1526 10/17/20 1821 10/18/20 0344 10/18/20 0348 10/18/20 0754  WBC 9.9  --   --  8.3 7.5  HGB 7.0*   < > 7.3* 7.1* 7.4*  HCT 23.7*   < > 23.9* 22.9* 24.6*  PLT 418*  --   --  377 382   < > = values in this  interval not displayed.   BMET Recent Labs    10/17/20 1526 10/18/20 0348  NA 133* 137  K 3.5 3.2*  CL 88* 92*  CO2 33* 32  GLUCOSE 126* 78  BUN 88* 73*  CREATININE 2.25* 1.83*  CALCIUM 7.6* 7.5*   LFT Recent Labs    10/18/20 0348  PROT 5.8*  ALBUMIN 3.1*  AST 13*  ALT 11  ALKPHOS 79  BILITOT 0.6   PT/INR No results for input(s): LABPROT, INR in the last 72 hours. Hepatitis Panel No results for input(s): HEPBSAG, HCVAB, HEPAIGM, HEPBIGM in the last 72 hours.   . CBC Latest Ref Rng & Units 10/18/2020 10/18/2020 10/18/2020  WBC 4.0 - 10.5 K/uL 7.5 8.3 -  Hemoglobin 13.0 - 17.0 g/dL 7.4(L) 7.1(L) 7.3(L)  Hematocrit 39.0 - 52.0 % 24.6(L) 22.9(L) 23.9(L)  Platelets 150 - 400 K/uL 382 377 -    . CMP Latest Ref Rng & Units 10/18/2020 10/17/2020 09/18/2020  Glucose 70 - 99 mg/dL 78  126(H) 99  BUN 8 - 23 mg/dL 73(H) 88(H) 99(H)  Creatinine 0.61 - 1.24 mg/dL 1.83(H) 2.25(H) 2.30(H)  Sodium 135 - 145 mmol/L 137 133(L) 134(L)  Potassium 3.5 - 5.1 mmol/L 3.2(L) 3.5 4.3  Chloride 98 - 111 mmol/L 92(L) 88(L) 90(L)  CO2 22 - 32 mmol/L 32 33(H) 32  Calcium 8.9 - 10.3 mg/dL 7.5(L) 7.6(L) 7.8(L)  Total Protein 6.5 - 8.1 g/dL 5.8(L) 6.8 -  Total Bilirubin 0.3 - 1.2 mg/dL 0.6 0.7 -  Alkaline Phos 38 - 126 U/L 79 100 -  AST 15 - 41 U/L 13(L) 15 -  ALT 0 - 44 U/L 11 12 -   Studies/Results: DG Chest Portable 1 View  Result Date: 10/17/2020 CLINICAL DATA:  Congestive heart failure. EXAM: PORTABLE CHEST 1 VIEW COMPARISON:  Chest x-ray 06/02/2019, CT chest 01/15/2021 FINDINGS: Azygous fissure incidentally again noted. The heart and mediastinal contours are unchanged. Aortic calcification. Redemonstration of lingular atelectasis in the setting of a large hiatal hernia. No focal consolidation. No pulmonary edema. No pleural effusion. No pneumothorax. No acute osseous abnormality. Right shoulder rotator cuff anchor suture. IMPRESSION: 1. Large hiatal hernia. 2. No acute cardiopulmonary abnormality. Electronically Signed   By: Iven Finn M.D.   On: 10/17/2020 17:44    Active Problems:   Hyperlipidemia   BPH (benign prostatic hyperplasia)   Chronic venous insufficiency   Barrett's esophagus   Non Hodgkin's lymphoma (HCC)   Essential hypertension   Asthma/COPD   CKD (chronic kidney disease) stage 3, GFR 30-59 ml/min (HCC)   Chronic diastolic CHF (congestive heart failure) ([name])   Diabetic polyneuropathy associated with type 2 diabetes mellitus ([place])   GI bleed    Tye Savoy, NP-C @  10/18/2020, 11:19 AM

## 2020-10-18 NOTE — Consult Note (Signed)
Referring Provider:  [facility]         Primary Care Physician:  Roetta Sessions, NP Primary Gastroenterologist:  Dr. Sharlett Iles ( retired)  Reason for Consultation:   anemia              ASSESSMENT / PLAN   # 87 yo male with acute on chronic iron deficiency anemia in setting of CKD.  FOBT + on Eliquis. He is unsure of any overt GI blood loss ( poor vision). Chronic anemia may have worsen because he has been off of  oral iron for several months ( just restarted it two days ago). He has a large hiatal hernia ( CT chest Dec 2021) and could have associated Cameron's lesions leading to chronic occult GI blood loss. Other etiologies such as erosive disease, PUD, AVMs, or intestinal neoplasm are other considerations.   --Will start with an EGD in am. The risks and benefits of EGD with possible biopsies were discussed with the patient who agrees to proceed. The need for colonoscopy will depending on EGD findings. We did discuss colonoscopy and he would be willing to proceed.  --Hgb stable at 7.4 (but only up 0.4 grams after 1 u RPBC)  # Large hiatal hernia with predominantly intrathoracic stomach in the lower left hemithorax CT scan .   # History of Barrett's esophagus ( remote). On daily PPI  # A-Fibrillation, on Eliquis ( on hold)   # See additional medical problems below  HISTORY OF PRESENT ILLNESS                                                                                                                         Chief Complaint: anemia  Ryan [name] is a 87 y.o. male with a past medical history significant for hypertension, hyperlipidemia, Barrett's esophagus, severe diverticulosis, CHF, pulmonary hypertension, PAF on Eliquis, DM2, CKD 3, history of CVA, remote non-Hodgkin's lymphoma, BPH, interstitial lung disease on home O2.  See PMH for any additional medical history.    ED course:  Patient's PCP sent him to ED yesterday for low hemoglobin. He lives at  [facility].  In the ED his hemoglobin was 7 down from baseline of mid 9 to mid 10. MCV 77.8, creatinine 1.8, BUN 73. FOBT +.   Patient says his vision is not good so he cannot say for sure if he has had any overt GI bleeding. He stopped taking oral iron several months ago but restarted it two days ago. He has chronic constipation, no other GI complaints. Specifically no abdominal pain , nausea, vomiting or unexplained weight loss.   PREVIOUS ENDOSCOPIC EVALUATIONS   2004 colonoscopy  Several small polyps ( 3 biopsied and one removed). Path c/w hyperplastic polyps   Past Medical History:  Diagnosis Date   Asthma    Barrett's esophagus    Benign prostatic hypertrophy    Bilateral lower extremity edema    CHF (congestive heart failure) (HCC)    Chronic  fatigue    COPD (chronic obstructive pulmonary disease) (HCC)    Diabetes mellitus type 2 in nonobese ([place]) 04/24/2019   Diverticulosis of colon (without mention of hemorrhage)    Esophageal reflux    Gastritis    Gluten intolerance    Hiatal hernia 02/10/11   Noted on CT Scan - Moderate Hiatal Hernia   Hyperlipemia    Hypertension    Lymphoma of lymph nodes in pelvis ([place]) 03/03/2011    Large Right Retroperitoneal Mass   Melanoma ([place])    lymphoma   Microcytic anemia 04/20/2011    NHL (non-Hodgkin's 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 Collene Mares   Pulmonary hyperinflation    Renal cyst 02/10/11    Noted on CT Scan - Bilateral Renal Cysts   S/P radiation therapy 03/15/11 - 04/09/11   Abdominal/ Pelvic Tumor, 3600 cGy/20 Fractions    Past Surgical History:  Procedure Laterality Date   ARTHROSCOPIC REPAIR ACL     right   BONE MARROW ASPIRATION  02/25/11   Bone Marrow, Aspirate, Clot, and Bilateral Bx, Right PIC   CATARACT EXTRACTION, BILATERAL     EYE  SURGERY     HERNIA REPAIR     LEFT INGUINAL    INSERTION OF MESH N/A 08/23/2012   Procedure: INSERTION OF MESH;  Surgeon: Madilyn Hook, DO;  Location: [facility];  Service: General;  Laterality: N/A;   LOOP RECORDER INSERTION N/A 04/26/2019   Procedure: LOOP RECORDER INSERTION;  Surgeon: Evans Lance, MD;  Location: [facility];  Service: Cardiovascular;  Laterality: N/A;   LUMBAR LAMINECTOMY/DECOMPRESSION MICRODISCECTOMY Right 12/31/2013   Procedure: RIGHT L4-5 L5-S1 LAMINECTOMY;  Surgeon: Kristeen Miss, MD;  Location: [place];  Service: Neurosurgery;  Laterality: Right;  RIGHT L4-5 L5-S1 LAMINECTOMY   RIGHT HEART CATH N/A 07/25/2018   Procedure: RIGHT HEART CATH;  Surgeon: Larey Dresser, MD;  Location: [facility];  Service: Cardiovascular;  Laterality: N/A;   ROTATOR CUFF REPAIR     right   TONSILLECTOMY     TONSILLECTOMY     VENTRAL HERNIA REPAIR N/A 08/23/2012   Procedure: HERNIA REPAIR VENTRAL ADULT;  Surgeon: Madilyn Hook, DO;  Location: [facility];  Service: General;  Laterality: N/A;    Prior to Admission medications   Medication Sig Start Date End Date Taking? Authorizing Provider  acetaminophen (TYLENOL) 500 MG tablet Take 500 mg by mouth every 6 (six) hours as needed for mild pain or headache.   Yes [provider]  atorvastatin (LIPITOR) 40 MG tablet Take 1 tablet (40 mg total) by mouth daily at 6 PM. 04/27/19  Yes Hosie Poisson, MD  cyanocobalamin (,VITAMIN B-12,) 1000 MCG/ML injection INJECT 1ML INTRAMUSCULARLY ONCE A WEEK FOR 3 WEEKS THEN MONTHLY THERAFTER Patient taking differently: Inject 1,000 mcg into the muscle every 30 (thirty) days. 09/03/20  Yes Ward Givens, NP  diltiazem (CARDIZEM CD) 120 MG 24 hr capsule Take 1 capsule (120 mg total) by mouth daily. 05/04/19  Yes Gonfa, Charlesetta Ivory, MD  ELIQUIS 2.5 MG TABS tablet TAKE ONE TABLET TWICE DAILY Patient taking differently: Take 2.5 mg by mouth 2 (two) times daily. 07/14/20  Yes Larey Dresser, MD   escitalopram (LEXAPRO) 10 MG tablet Take 10 mg by mouth daily.   Yes [provider]  Caroleen Hamman  325 (65 Fe) MG tablet Take 325 mg by mouth 2 (two) times daily with a meal.   Yes [provider]  finasteride (PROSCAR) 5 MG tablet Take 5 mg by mouth daily.   Yes [provider]  FLONASE SENSIMIST 27.5 MCG/SPRAY nasal spray Place 1 spray into the nose daily as needed for rhinitis or allergies.  12/11/18  Yes [provider]  Melatonin 10 MG TABS Take 10 mg by mouth at bedtime.   Yes [provider]  methylcellulose (CITRUCEL) oral powder Mix 2 grams in 8 ounces of water and drink twice a day. Patient taking differently: See admin instructions. Mix 2 grams of powder into 8 ounces of water and drink twice a day. 07/13/19  Yes Virgel Manifold, MD  metolazone (ZAROXOLYN) 2.5 MG tablet Take every other Monday Patient taking differently: Take 2.5 mg by mouth See admin instructions. Take 2.5 mg by mouth every other Monday 09/08/20  Yes [place], Maricela Bo, FNP  NON FORMULARY Take 1 tablet by mouth See admin instructions. Unnamed laxative: Take 1 tablet by mouth at bedtime as needed for iron-induced constipation   Yes [provider]  omeprazole (PRILOSEC) 20 MG capsule Take 20 mg by mouth daily.   Yes [provider]  Riociguat (ADEMPAS) 2.5 MG TABS Take 2.5 mg by mouth 3 (three) times daily. 10/15/20  Yes Larey Dresser, MD  rOPINIRole (REQUIP) 2 MG tablet Take 2 mg by mouth See admin instructions. Take 2 mg by mouth three times a day and an additional 2 mg once a day as needed for restless legs   Yes [provider]  spironolactone (ALDACTONE) 25 MG tablet Take 12.5 mg by mouth in the morning.   Yes [provider]  torsemide (DEMADEX) 20 MG tablet TAKE 5 TABLETS TWICE DAILY Patient taking differently: Take 100 mg by mouth 2 (two) times daily. 05/16/20  Yes Larey Dresser, MD  traMADol (ULTRAM) 50 MG tablet Take 50 mg by mouth every  6 (six) hours.   Yes [provider]  traZODone (DESYREL) 100 MG tablet Take 100 mg by mouth at bedtime. 02/12/19  Yes [provider]  UPTRAVI 1600 MCG TABS Take 1,600 mcg by mouth 2 (two) times daily.   Yes [provider]  UPTRAVI 200 MCG TABS Take 400 mcg by mouth 2 (two) times daily.   Yes [provider]  eplerenone (INSPRA) 25 MG tablet Take 0.5 tablets (12.5 mg total) by mouth daily. Patient not taking: Reported on 10/17/2020 09/08/20   Rafael Bihari, FNP    Current Facility-Administered Medications  Medication Dose Route Frequency Provider Last Rate Last Admin   0.9 %  sodium chloride infusion  250 mL Intravenous PRN Orma Flaming, MD       acetaminophen (TYLENOL) tablet 650 mg  650 mg Oral Q6H PRN Orma Flaming, MD   650 mg at 10/18/20 0700   Or   acetaminophen (TYLENOL) suppository 650 mg  650 mg Rectal Q6H PRN Orma Flaming, MD       atorvastatin (LIPITOR) tablet 40 mg  40 mg Oral q1800 Orma Flaming, MD       diltiazem (CARDIZEM CD) 24 hr capsule 120 mg  120 mg Oral Daily Orma Flaming, MD   120 mg at 10/18/20 1009   escitalopram (LEXAPRO) tablet 10 mg  10 mg Oral Daily Orma Flaming, MD   10 mg at 10/18/20 1009   insulin aspart (novoLOG) injection 0-9 Units  0-9 Units Subcutaneous TID  WC Orma Flaming, MD       melatonin tablet 10 mg  10 mg Oral QHS Orma Flaming, MD   10 mg at 10/17/20 2215   [START ON 10/20/2020] metolazone (ZAROXOLYN) tablet 2.5 mg  2.5 mg Oral Q14 Days Orma Flaming, MD       pantoprazole (PROTONIX) injection 40 mg  40 mg Intravenous Q12H Orma Flaming, MD   40 mg at 10/18/20 1014   polyvinyl alcohol (LIQUIFILM TEARS) 1.4 % ophthalmic solution 1 drop  1 drop Both Eyes PRN Orma Flaming, MD   1 drop at 10/18/20 0542   Riociguat TABS 2.5 mg  2.5 mg Oral TID Orma Flaming, MD       rOPINIRole (REQUIP) tablet 2 mg  2 mg Oral TID Orma Flaming, MD   2 mg at 10/18/20 0654   Selexipag TABS 1,600 mcg  1,600 mcg Oral  BID Orma Flaming, MD       Selexipag TABS 400 mcg  400 mcg Oral BID Orma Flaming, MD       sodium chloride flush (NS) 0.9 % injection 3 mL  3 mL Intravenous Q12H Orma Flaming, MD   3 mL at 10/18/20 1014   sodium chloride flush (NS) 0.9 % injection 3 mL  3 mL Intravenous PRN Orma Flaming, MD       spironolactone (ALDACTONE) tablet 12.5 mg  12.5 mg Oral q AM Orma Flaming, MD   12.5 mg at 10/18/20 1008   torsemide (DEMADEX) tablet 100 mg  100 mg Oral BID Orma Flaming, MD   100 mg at 10/18/20 1008   traMADol (ULTRAM) tablet 50 mg  50 mg Oral Q6H PRN Jonetta Osgood, MD   50 mg at 10/18/20 0805   traZODone (DESYREL) tablet 100 mg  100 mg Oral QHS Orma Flaming, MD   100 mg at 10/17/20 2215    Allergies as of 10/17/2020 - Review Complete 10/17/2020  Allergen Reaction Noted   Pollen extract-tree extract Other (See Comments) 04/30/2011   Molds & smuts Other (See Comments) 02/17/2011    Family History  Problem Relation Age of Onset   Heart disease Father        MI 59   Urticaria Father    Prostate cancer Brother    Prostate cancer Paternal Uncle    Prostate cancer Paternal Uncle    Prostate cancer Paternal Uncle    Hyperlipidemia Other    Stroke Other    Hypertension Other    ADD / ADHD Other    Colon cancer Neg Hx    Allergic rhinitis Neg Hx    Asthma Neg Hx    Eczema Neg Hx     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[employer]  Tobacco Use   Smoking status: Former    Packs/day: 1.00    Years: 35.00    Pack years: 35.00    Types: Pipe, Cigarettes    Quit date: 02/23/1992    Years since quitting: 28.6   Smokeless tobacco: Never   Tobacco comments:    quit 20 years ago  Vaping Use   Vaping Use: Never used  Substance and Sexual  Activity   Alcohol use: Not Currently    Comment:  2 drinks daily scotch  AND WINE WITH SUPPER   Drug use: No   Sexual activity: Not on file  Other Topics Concern   Not on file  Social History Narrative   Married - second marriage   He has two children   Retired Horticulturist, commercial Professor   Currently teaches pottery making, 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[facility]   Former Smoker quit 12 years ago- smoked for 35 years   Alcohol use- not currently   Right handed   Social Determinants of Radio broadcast assistant Strain: Not on file  Food Insecurity: Not on file  Transportation Needs: Not on file  Physical Activity: Not on file  Stress: Not on file  Social Connections: Not on file  Intimate Partner Violence: Not on file    Review of Systems: Positive for fatigue over last several months. Positive for SOB for a few months but mainly when not wearing oxygen. All other systems reviewed and negative except where noted in HPI.    OBJECTIVE    Physical Exam: Vital signs in last 24 hours: Temp:  [97 F (36.1 C)-98.2 F (36.8 C)] 98.2 F (36.8 C) (09/17 0400) Pulse Rate:  [76-97] 76 (09/17 0800) Resp:  [12-42] 15 (09/17 0800) BP: (89-113)/(53-84) 89/60 (09/17 0800) SpO2:  [89 %-98 %] 91 % (09/17 0800) Weight:  [79.1 kg-81.7 kg] 81.7 kg (09/17 0500)   General:   Alert  male in NAD in bedside chair Psych:  Pleasant, cooperative. Normal mood and affect. Eyes:  Pupils equal, sclera clear, no icterus.   Conjunctiva pink. Ears:  Normal auditory acuity. Nose:  No deformity, discharge,  or lesions. Neck:  Supple; no masses Lungs:  Clear throughout to auscultation. "Velcro" type sounds at both bases.  Heart:  Regular rate, 3+ BLE edema Abdomen:  Limited exam in bedside chair. Abdomen soft, non-distended, nontender, BS active,  area of superficial fullness just right of umbilicus.   Rectal:  Deferred  Msk:  Symmetrical without gross deformities. . Neurologic:  Alert and  oriented x4;   grossly normal neurologically. Skin:  Intact without significant lesions or rashes.   Scheduled inpatient medications  atorvastatin  40 mg Oral q1800   diltiazem  120 mg Oral Daily   escitalopram  10 mg Oral Daily   insulin aspart  0-9 Units Subcutaneous TID WC   melatonin  10 mg Oral QHS   [START ON 10/20/2020] metolazone  2.5 mg Oral Q14 Days   pantoprazole (PROTONIX) IV  40 mg Intravenous Q12H   Riociguat  2.5 mg Oral TID   rOPINIRole  2 mg Oral TID   Selexipag  1,600 mcg Oral BID   Selexipag  400 mcg Oral BID   sodium chloride flush  3 mL Intravenous Q12H   spironolactone  12.5 mg Oral q AM   torsemide  100 mg Oral BID   traZODone  100 mg Oral QHS      Intake/Output from previous day: 09/16 0701 - 09/17 0700 In: 377.9 [Blood:377.9] Out: 1150 [Urine:1150] Intake/Output this shift: Total I/O In: -  Out: 575 [Urine:575]   Lab Results: Recent Labs    10/17/20 1526 10/17/20 1821 10/18/20 0344 10/18/20 0348 10/18/20 0754  WBC 9.9  --   --  8.3 7.5  HGB 7.0*   < > 7.3* 7.1* 7.4*  HCT 23.7*   < > 23.9* 22.9* 24.6*  PLT 418*  --   --  377 382   < > = values in this  interval not displayed.   BMET Recent Labs    10/17/20 1526 10/18/20 0348  NA 133* 137  K 3.5 3.2*  CL 88* 92*  CO2 33* 32  GLUCOSE 126* 78  BUN 88* 73*  CREATININE 2.25* 1.83*  CALCIUM 7.6* 7.5*   LFT Recent Labs    10/18/20 0348  PROT 5.8*  ALBUMIN 3.1*  AST 13*  ALT 11  ALKPHOS 79  BILITOT 0.6   PT/INR No results for input(s): LABPROT, INR in the last 72 hours. Hepatitis Panel No results for input(s): HEPBSAG, HCVAB, HEPAIGM, HEPBIGM in the last 72 hours.   . CBC Latest Ref Rng & Units 10/18/2020 10/18/2020 10/18/2020  WBC 4.0 - 10.5 K/uL 7.5 8.3 -  Hemoglobin 13.0 - 17.0 g/dL 7.4(L) 7.1(L) 7.3(L)  Hematocrit 39.0 - 52.0 % 24.6(L) 22.9(L) 23.9(L)  Platelets 150 - 400 K/uL 382 377 -    . CMP Latest Ref Rng & Units 10/18/2020 10/17/2020 09/18/2020  Glucose 70 - 99 mg/dL 78  126(H) 99  BUN 8 - 23 mg/dL 73(H) 88(H) 99(H)  Creatinine 0.61 - 1.24 mg/dL 1.83(H) 2.25(H) 2.30(H)  Sodium 135 - 145 mmol/L 137 133(L) 134(L)  Potassium 3.5 - 5.1 mmol/L 3.2(L) 3.5 4.3  Chloride 98 - 111 mmol/L 92(L) 88(L) 90(L)  CO2 22 - 32 mmol/L 32 33(H) 32  Calcium 8.9 - 10.3 mg/dL 7.5(L) 7.6(L) 7.8(L)  Total Protein 6.5 - 8.1 g/dL 5.8(L) 6.8 -  Total Bilirubin 0.3 - 1.2 mg/dL 0.6 0.7 -  Alkaline Phos 38 - 126 U/L 79 100 -  AST 15 - 41 U/L 13(L) 15 -  ALT 0 - 44 U/L 11 12 -   Studies/Results: DG Chest Portable 1 View  Result Date: 10/17/2020 CLINICAL DATA:  Congestive heart failure. EXAM: PORTABLE CHEST 1 VIEW COMPARISON:  Chest x-ray 06/02/2019, CT chest 01/15/2021 FINDINGS: Azygous fissure incidentally again noted. The heart and mediastinal contours are unchanged. Aortic calcification. Redemonstration of lingular atelectasis in the setting of a large hiatal hernia. No focal consolidation. No pulmonary edema. No pleural effusion. No pneumothorax. No acute osseous abnormality. Right shoulder rotator cuff anchor suture. IMPRESSION: 1. Large hiatal hernia. 2. No acute cardiopulmonary abnormality. Electronically Signed   By: Iven Finn M.D.   On: 10/17/2020 17:44    Active Problems:   Hyperlipidemia   BPH (benign prostatic hyperplasia)   Chronic venous insufficiency   Barrett's esophagus   Non Hodgkin's lymphoma (HCC)   Essential hypertension   Asthma/COPD   CKD (chronic kidney disease) stage 3, GFR 30-59 ml/min (HCC)   Chronic diastolic CHF (congestive heart failure) ([name])   Diabetic polyneuropathy associated with type 2 diabetes mellitus ([place])   GI bleed    Tye Savoy, NP-C @  10/18/2020, 11:19 AM

## 2020-10-19 ENCOUNTER — Inpatient Hospital Stay (HOSPITAL_COMMUNITY): Payer: Medicare HMO | Admitting: Certified Registered Nurse Anesthetist

## 2020-10-19 ENCOUNTER — Encounter (HOSPITAL_COMMUNITY): Payer: Self-pay | Admitting: Family Medicine

## 2020-10-19 ENCOUNTER — Encounter (HOSPITAL_COMMUNITY)
Admission: EM | Disposition: A | Discharge status: Home Health | Payer: Self-pay | Source: Skilled Nursing Facility | Attending: Internal Medicine

## 2020-10-19 DIAGNOSIS — D649 Anemia, unspecified: Secondary | ICD-10-CM | POA: Diagnosis not present

## 2020-10-19 DIAGNOSIS — K317 Polyp of stomach and duodenum: Principal | ICD-10-CM

## 2020-10-19 DIAGNOSIS — K2270 Barrett's esophagus without dysplasia: Secondary | ICD-10-CM

## 2020-10-19 DIAGNOSIS — K922 Gastrointestinal hemorrhage, unspecified: Secondary | ICD-10-CM | POA: Diagnosis not present

## 2020-10-19 DIAGNOSIS — K22719 Barrett's esophagus with dysplasia, unspecified: Secondary | ICD-10-CM

## 2020-10-19 DIAGNOSIS — I10 Essential (primary) hypertension: Secondary | ICD-10-CM | POA: Diagnosis not present

## 2020-10-19 HISTORY — PX: ESOPHAGOGASTRODUODENOSCOPY (EGD) WITH PROPOFOL: SHX5813

## 2020-10-19 HISTORY — PX: POLYPECTOMY: SHX5525

## 2020-10-19 HISTORY — PX: HEMOSTASIS CLIP PLACEMENT: SHX6857

## 2020-10-19 LAB — BASIC METABOLIC PANEL
Anion gap: 11 (ref 5–15)
BUN: 55 mg/dL — ABNORMAL HIGH (ref 8–23)
CO2: 34 mmol/L — ABNORMAL HIGH (ref 22–32)
Calcium: 7.9 mg/dL — ABNORMAL LOW (ref 8.9–10.3)
Chloride: 92 mmol/L — ABNORMAL LOW (ref 98–111)
Creatinine, Ser: 1.67 mg/dL — ABNORMAL HIGH (ref 0.61–1.24)
GFR, Estimated: 40 mL/min — ABNORMAL LOW (ref >60)
Glucose, Bld: 87 mg/dL (ref 70–99)
Potassium: 4.2 mmol/L (ref 3.5–5.1)
Sodium: 137 mmol/L (ref 135–145)

## 2020-10-19 LAB — CBC
HCT: 27.9 % — ABNORMAL LOW (ref 39.0–52.0)
Hemoglobin: 8.2 g/dL — ABNORMAL LOW (ref 13.0–17.0)
MCH: 23 pg — ABNORMAL LOW (ref 26.0–34.0)
MCHC: 29.4 g/dL — ABNORMAL LOW (ref 30.0–36.0)
MCV: 78.4 fL — ABNORMAL LOW (ref 80.0–100.0)
Platelets: 375 10*3/uL (ref 150–400)
RBC: 3.56 MIL/uL — ABNORMAL LOW (ref 4.22–5.81)
RDW: 18.4 % — ABNORMAL HIGH (ref 11.5–15.5)
WBC: 8.9 10*3/uL (ref 4.0–10.5)
nRBC: 0 % (ref 0.0–0.2)

## 2020-10-19 LAB — GLUCOSE, CAPILLARY
Glucose-Capillary: 83 mg/dL (ref 70–99)
Glucose-Capillary: 109 mg/dL — ABNORMAL HIGH (ref 70–99)
Glucose-Capillary: 152 mg/dL — ABNORMAL HIGH (ref 70–99)

## 2020-10-19 SURGERY — ESOPHAGOGASTRODUODENOSCOPY (EGD) WITH PROPOFOL
Anesthesia: Monitor Anesthesia Care

## 2020-10-19 MED ORDER — LIDOCAINE 2% (20 MG/ML) 5 ML SYRINGE
INTRAMUSCULAR | Status: DC | PRN
  Administered 2020-10-19: 60 mg via INTRAVENOUS
  Administered 2020-10-19: 40 mg via INTRAVENOUS

## 2020-10-19 MED ORDER — LACTATED RINGERS IV SOLN
INTRAVENOUS | Status: AC | PRN
Start: 2020-10-19 — End: 2020-10-19
  Administered 2020-10-19: 1000 mL via INTRAVENOUS

## 2020-10-19 MED ORDER — PHENYLEPHRINE 40 MCG/ML (10ML) SYRINGE FOR IV PUSH (FOR BLOOD PRESSURE SUPPORT)
PREFILLED_SYRINGE | INTRAVENOUS | Status: DC | PRN
  Administered 2020-10-19 (×4): 80 ug via INTRAVENOUS

## 2020-10-19 MED ORDER — PROPOFOL 500 MG/50ML IV EMUL
INTRAVENOUS | Status: DC | PRN
  Administered 2020-10-19: 75 ug/kg/min via INTRAVENOUS

## 2020-10-19 MED ORDER — PROPOFOL 10 MG/ML IV BOLUS
INTRAVENOUS | Status: DC | PRN
  Administered 2020-10-19: 30 mg via INTRAVENOUS
  Administered 2020-10-19: 20 mg via INTRAVENOUS
  Administered 2020-10-19 (×6): 30 mg via INTRAVENOUS

## 2020-10-19 MED ORDER — EPHEDRINE SULFATE-NACL 50-0.9 MG/10ML-% IV SOSY: PREFILLED_SYRINGE | INTRAVENOUS | Status: DC | PRN

## 2020-10-19 MED ORDER — INFLUENZA VAC A&B SA ADJ QUAD 0.5 ML IM PRSY
0.5000 mL | PREFILLED_SYRINGE | INTRAMUSCULAR | Status: DC
  Filled 2020-10-19: qty 0.5

## 2020-10-19 SURGICAL SUPPLY — 15 items

## 2020-10-19 NOTE — Progress Notes (Signed)
PROGRESS NOTE        PATIENT DETAILS Name: Ryan [name] Age: 87 y.o. Sex: male Date of Birth: [birth year] Admit Date: 10/17/2020 Admitting Physician Orma Flaming, MD [id number], Rocco Pauls, NP  Brief Narrative: Patient is a 87 y.o. male with history of HTN, HLD, RLS, Barrett's esophagus, pulmonary hypertension, HFpEF, PAF on Eliquis, DM-2, CKD stage IIIa, non-Hodgkin's lymphoma (in remission) ILD with chronic hypoxic respiratory failure on home O2 2 L/minute-referred to the ED by PCP for a hemoglobin of 6.5.  See below for further details.  Subjective: No major issues overnight-apart from having RLS.  Asking medications to be adjusted as he takes it at home.  Objective: Vitals: Blood pressure 97/62, pulse 77, temperature 98.1 F (36.7 C), temperature source Oral, resp. rate 19, height _0  (1.702 m), weight 82 kg, SpO2 92 %.   Exam: Gen Exam:Alert awake-not in any distress HEENT:atraumatic, normocephalic Chest: B/L clear to auscultation anteriorly CVS:S1S2 regular Abdomen:soft non tender, non distended Extremities:no edema Neurology: Non focal Skin: no rash   Pertinent Labs/Radiology: WBC: 8.9 Hb: 8.2 Creatinine: 1.67 K: 4.2  CXR: Large hiatal hernia   Assessment/Plan: Severe microcytic anemia-rule out occult GI bleeding-FOBT positive: No overt GI bleeding noted-but patient with poor vision/poor historian.  Hemoglobin stable after 1 unit of PRBC.  Plans are for EGD today-continue PPI-await further recommendations from GI.    History of Barrett's esophagus: On PPI-EGD scheduled for today  CKD stage IIIa: Close to baseline-watch closely as patient on diuretics.  HFpEF: Volume status stable-continue Demadex/metolazone/Aldactone  Pulmonary hypertension: Continue selexipag and Adempas  PAF: Continue Cardizem-Eliquis on hold given severity of anemia.  DM-2 (A1c 5.9 on 9/16): CBG stable with SSI  Recent Labs    10/18/20 1600  10/18/20 2109 10/19/20 0822  GLUCAP 92 108* 83     HLD: Continue statin  RLS: Continue Requip  Procedures :None Consults: GI DVT Prophylaxis: None Code Status:Full code Family Communication: None at bedside  Time spent: 25 minutes-Greater than 50% of this time was spent in counseling, explanation of diagnosis, planning of further management, and coordination of care.  Diet: Diet Order             Diet NPO time specified  Diet effective midnight                      Disposition Plan: Status is: Inpatient  Remains inpatient appropriate because:Inpatient level of care appropriate due to severity of illness  Dispo: The patient is from: Home              Anticipated d/c is to: Home              Patient currently is not medically stable to d/c.   Difficult to place patient No    Barriers to Discharge: Severe anemia-concern for GI bleeding-EGD scheduled for tomorrow.  Antimicrobial agents: Anti-infectives (From admission, onward)    None        MEDICATIONS: Scheduled Meds:  atorvastatin  40 mg Oral q1800   diltiazem  120 mg Oral Daily   escitalopram  10 mg Oral Daily   [START ON 10/20/2020] influenza vaccine adjuvanted  0.5 mL Intramuscular Tomorrow-1000   insulin aspart  0-9 Units Subcutaneous TID WC   melatonin  10 mg Oral QHS   [START ON 10/20/2020] metolazone  2.5 mg Oral  Q14 Days   pantoprazole (PROTONIX) IV  40 mg Intravenous Q12H   polyethylene glycol  17 g Oral BID   Riociguat  2.5 mg Oral TID   rOPINIRole  2 mg Oral TID   Selexipag  1,600 mcg Oral BID   Selexipag  400 mcg Oral BID   senna  2 tablet Oral QHS   sodium chloride flush  3 mL Intravenous Q12H   spironolactone  12.5 mg Oral q AM   torsemide  100 mg Oral BID   traZODone  100 mg Oral QHS   Continuous Infusions:  sodium chloride     PRN Meds:.sodium chloride, acetaminophen **OR** acetaminophen, bisacodyl, polyvinyl alcohol, sodium chloride flush, traMADol   I have personally  reviewed following labs and imaging studies  LABORATORY DATA: CBC: Recent Labs  Lab 10/17/20 1526 10/17/20 1821 10/18/20 0344 10/18/20 0348 10/18/20 0754 10/18/20 1458 10/19/20 0112  WBC 9.9  --   --  8.3 7.5 9.5 8.9  NEUTROABS 7.7  --   --   --   --   --   --   HGB 7.0*   < > 7.3* 7.1* 7.4* 8.5* 8.2*  HCT 23.7*   < > 23.9* 22.9* 24.6* 28.1* 27.9*  MCV 76.7*  --   --  76.6* 77.8* 78.1* 78.4*  PLT 418*  --   --  377 382 410* 375   < > = values in this interval not displayed.     Basic Metabolic Panel: Recent Labs  Lab 10/17/20 1526 10/18/20 0348 10/19/20 0112  NA 133* 137 137  K 3.5 3.2* 4.2  CL 88* 92* 92*  CO2 33* 32 34*  GLUCOSE 126* 78 87  BUN 88* 73* 55*  CREATININE 2.25* 1.83* 1.67*  CALCIUM 7.6* 7.5* 7.9*     GFR: Estimated Creatinine Clearance: 32.6 mL/min (A) (by C-G formula based on SCr of 1.67 mg/dL (H)).  Liver Function Tests: Recent Labs  Lab 10/17/20 1526 10/18/20 0348  AST 15 13*  ALT 12 11  ALKPHOS 100 79  BILITOT 0.7 0.6  PROT 6.8 5.8*  ALBUMIN 3.6 3.1*    No results for input(s): LIPASE, AMYLASE in the last 168 hours. No results for input(s): AMMONIA in the last 168 hours.  Coagulation Profile: No results for input(s): INR, PROTIME in the last 168 hours.  Cardiac Enzymes: No results for input(s): CKTOTAL, CKMB, CKMBINDEX, TROPONINI in the last 168 hours.  BNP (last 3 results) No results for input(s): PROBNP in the last 8760 hours.  Lipid Profile: No results for input(s): CHOL, HDL, LDLCALC, TRIG, CHOLHDL, LDLDIRECT in the last 72 hours.  Thyroid Function Tests: No results for input(s): TSH, T4TOTAL, FREET4, T3FREE, THYROIDAB in the last 72 hours.  Anemia Panel: No results for input(s): VITAMINB12, FOLATE, FERRITIN, TIBC, IRON, RETICCTPCT in the last 72 hours.  Urine analysis:    Component Value Date/Time   COLORURINE STRAW (A) 07/13/2019 1041   APPEARANCEUR CLEAR 07/13/2019 1041   LABSPEC 1.008 07/13/2019 1041    LABSPEC 1.025 06/28/2013 1321   PHURINE 6.0 07/13/2019 1041   GLUCOSEU NEGATIVE 07/13/2019 1041   GLUCOSEU Negative 06/28/2013 1321   HGBUR NEGATIVE 07/13/2019 1041   BILIRUBINUR NEGATIVE 07/13/2019 1041   BILIRUBINUR Negative 06/28/2013 [place] 07/13/2019 1041   PROTEINUR NEGATIVE 07/13/2019 1041   UROBILINOGEN 1.0 01/04/2014 2156   UROBILINOGEN 0.2 06/28/2013 1321   NITRITE NEGATIVE 07/13/2019 [place] 07/13/2019 1041   LEUKOCYTESUR Negative 06/28/2013 1321  Sepsis Labs: Lactic Acid, Venous    Component Value Date/Time   LATICACIDVEN 0.6 04/30/2019 1424    MICROBIOLOGY: Recent Results (from the past 240 hour(s))  Resp Panel by RT-PCR (Flu A&B, Covid) Nasopharyngeal Swab     Status: None   Collection Time: 10/17/20  5:13 PM   Specimen: Nasopharyngeal Swab; Nasopharyngeal(NP) swabs in vial transport medium  Result Value Ref Range Status   SARS Coronavirus 2 by RT PCR NEGATIVE NEGATIVE Final    Comment: (NOTE) SARS-CoV-2 target nucleic acids are NOT DETECTED.  The SARS-CoV-2 RNA is generally detectable in upper respiratory specimens during the acute phase of infection. The lowest concentration of SARS-CoV-2 viral copies this assay can detect is 138 copies/mL. A negative result does not preclude SARS-Cov-2 infection and should not be used as the sole basis for treatment or other patient management decisions. A negative result may occur with  improper specimen collection/handling, submission of specimen other than nasopharyngeal swab, presence of viral mutation(s) within the areas targeted by this assay, and inadequate number of viral copies(<138 copies/mL). A negative result must be combined with clinical observations, patient history, and epidemiological information. The expected result is Negative.  Fact Sheet for Patients:  EntrepreneurPulse.com.au  Fact Sheet for Healthcare Providers:   IncredibleEmployment.be  This test is no t yet approved or cleared by the [place] FDA and  has been authorized for detection and/or diagnosis of SARS-CoV-2 by FDA under an Emergency Use Authorization (EUA). This EUA will remain  in effect (meaning this test can be used) for the duration of the COVID-19 declaration under Section 564(b)(1) of the Act, 21 U.S.C.section 360bbb-3(b)(1), unless the authorization is terminated  or revoked sooner.       Influenza A by PCR NEGATIVE NEGATIVE Final   Influenza B by PCR NEGATIVE NEGATIVE Final    Comment: (NOTE) The Xpert Xpress SARS-CoV-2/FLU/RSV plus assay is intended as an aid in the diagnosis of influenza from Nasopharyngeal swab specimens and should not be used as a sole basis for treatment. Nasal washings and aspirates are unacceptable for Xpert Xpress SARS-CoV-2/FLU/RSV testing.  Fact Sheet for Patients: EntrepreneurPulse.com.au  Fact Sheet for Healthcare Providers: IncredibleEmployment.be  This test is not yet approved or cleared by the [place] FDA and has been authorized for detection and/or diagnosis of SARS-CoV-2 by FDA under an Emergency Use Authorization (EUA). This EUA will remain in effect (meaning this test can be used) for the duration of the COVID-19 declaration under Section 564(b)(1) of the Act, 21 U.S.C. section 360bbb-3(b)(1), unless the authorization is terminated or revoked.  Performed at [facility], [place] [address], [place], [place] [address]   MRSA Next Gen by PCR, Nasal     Status: None   Collection Time: 10/18/20  5:22 AM   Specimen: Nasal Mucosa; Nasal Swab  Result Value Ref Range Status   MRSA by PCR Next Gen NOT DETECTED NOT DETECTED Final    Comment: (NOTE) The GeneXpert MRSA Assay (FDA approved for NASAL specimens only), is one component of a comprehensive MRSA colonization surveillance program. It is not intended to diagnose  MRSA infection nor to guide or monitor treatment for MRSA infections. Test performance is not FDA approved in patients less than 64 years old. Performed at [facility], [place] [address], [place], [place] [address]     RADIOLOGY STUDIES/RESULTS: DG Chest Portable 1 View  Result Date: 10/17/2020 CLINICAL DATA:  Congestive heart failure. EXAM: PORTABLE CHEST 1 VIEW COMPARISON:  Chest x-ray 06/02/2019,  CT chest 01/15/2021 FINDINGS: Azygous fissure incidentally again noted. The heart and mediastinal contours are unchanged. Aortic calcification. Redemonstration of lingular atelectasis in the setting of a large hiatal hernia. No focal consolidation. No pulmonary edema. No pleural effusion. No pneumothorax. No acute osseous abnormality. Right shoulder rotator cuff anchor suture. IMPRESSION: 1. Large hiatal hernia. 2. No acute cardiopulmonary abnormality. Electronically Signed   By: Iven Finn M.D.   On: 10/17/2020 17:44     LOS: 2 days   Oren Binet, MD  [facility]    To contact the attending provider between 7A-7P or the covering provider during after hours 7P-7A, please log into the web site [email or web address] and access using universal [place] password for that web site. If you do not have the password, please call the hospital operator.  10/19/2020, 10:05 AM

## 2020-10-19 NOTE — Anesthesia Procedure Notes (Signed)
Procedure Name: MAC Date/Time: 10/19/2020 12:55 PM Performed by: Trinna Post., CRNA Pre-anesthesia Checklist: Patient identified, Emergency Drugs available, Suction available, Patient being monitored and Timeout performed Patient Re-evaluated:Patient Re-evaluated prior to induction Oxygen Delivery Method: Nasal cannula Preoxygenation: Pre-oxygenation with 100% oxygen Induction Type: IV induction Placement Confirmation: positive ETCO2

## 2020-10-19 NOTE — Progress Notes (Addendum)
Endo on site for patient transport. Pt alert and oriented, piv x2 flushed and secured, consent on chart.

## 2020-10-19 NOTE — Progress Notes (Addendum)
Physical Therapy Evaluation Patient Details Name: Ryan [name] MRN: [id number] DOB: [birth year] Today's Date: 10/19/2020  History of Present Illness  87 year old male admitted 9/16 with anemia and HGB drop.  Endoscopy 9/18.  PMH:  HFpEF, CVA, ILD on 2 L [place] oxygen, restless legs syndrome, HTN, HLD,  Barrett's esophagus, pulmonary hypertension, PAF on Eliquis, DM-2, CKD stage IIIa, non-Hodgkin's lymphoma (in remission)  Clinical Impression  Pt admitted with above diagnosis. Pt is able to ambulate safely with RW without assist.  Pt did desat to 88% on 2L with activity and was using O2 prior to admit. Should progress well.  Pt currently with functional limitations due to the deficits listed below (see PT Problem List). Pt will benefit from skilled PT to increase their independence and safety with mobility to allow discharge to the venue listed below.          Recommendations for follow up therapy are one component of a multi-disciplinary discharge planning process, led by the attending physician.  Recommendations may be updated based on patient status, additional functional criteria and insurance authorization.  Follow Up Recommendations Home health PT    Equipment Recommendations  None recommended by PT    Recommendations for Other Services       Precautions / Restrictions Precautions Precautions: Fall Restrictions Weight Bearing Restrictions: No      Mobility  Bed Mobility Overal bed mobility: Independent                  Transfers Overall transfer level: Needs assistance Equipment used: Rolling walker (2 wheeled) Transfers: Sit to/from Stand Sit to Stand: Min guard         General transfer comment: Did not need assist to stand to RW.  Ambulation/Gait Ambulation/Gait assistance: Min guard Gait Distance (Feet): 20 Feet Assistive device: Rolling walker (2 wheeled) Gait Pattern/deviations: Step-through pattern;Decreased stride length   Gait velocity  interpretation: <1.31 ft/sec, indicative of household ambulator General Gait Details: Pt able to ambulate to door and get into wheelchair as the tech came to get him as PT was finishing up the session.  Pt with good stability with the RW.  Pt was on 2LO2 and sats 88% and above on 2L.  Did desat to 85% when O2 removed prior to the walk.  Stairs            Wheelchair Mobility    Modified Rankin (Stroke Patients Only)       Balance Overall balance assessment: Needs assistance Sitting-balance support: Feet supported;No upper extremity supported Sitting balance-Leahy Scale: Fair     Standing balance support: Bilateral upper extremity supported;During functional activity Standing balance-Leahy Scale: Poor Standing balance comment: relies on bil UE support                             Pertinent Vitals/Pain Pain Assessment: Faces Faces Pain Scale: Hurts even more Pain Location: low back chronic, rest less legs per pt Pain Descriptors / Indicators: Aching;Grimacing;Guarding Pain Intervention(s): Limited activity within patient's tolerance;Monitored during session;Repositioned    Home Living Family/patient expects to be discharged to:: Private residence Living Arrangements: Alone Available Help at Discharge: Family;Available PRN/intermittently Type of Home: Independent living facility Home Access: Level entry     Home Layout: One level Home Equipment: Walker - 4 wheels;Grab bars - toilet;Grab bars - tub/shower;Shower seat;Electric scooter (sleeps in lift chair, home O2 at night and most of day - 2LO2) Additional Comments: pt married but  wife lives separately but she still makes arrangements for pt and take him to appts    Prior Function Level of Independence: Independent with assistive device(s);Needs assistance   Gait / Transfers Assistance Needed: walked wihtout the device in the apt, used rollator in facility and scooter in community  ADL's / [place] Needed: own Adls per pt  Comments: facility provided wellness checks every 3 hours even in independent living. pt cooks his own meals and completes his own baths - gives pt pills and take out trash - [place]     Hand Dominance   Dominant Hand: Right    Extremity/Trunk Assessment   Upper Extremity Assessment Upper Extremity Assessment: Defer to OT evaluation    Lower Extremity Assessment Lower Extremity Assessment: Generalized weakness    Cervical / Trunk Assessment Cervical / Trunk Assessment: Normal  Communication   Communication: No difficulties  Cognition Arousal/Alertness: Awake/alert Behavior During Therapy: WFL for tasks assessed/performed Overall Cognitive Status: Within Functional Limits for tasks assessed                                        General Comments General comments (skin integrity, edema, etc.): 84 bpm, 93% 2LO2, 97/53    Exercises General Exercises - Lower Extremity Ankle Circles/Pumps: AROM;Both;10 reps;Supine   Assessment/Plan    PT Assessment Patient needs continued PT services  PT Problem List Decreased activity tolerance;Decreased balance;Decreased mobility;Decreased knowledge of use of DME;Decreased safety awareness;Decreased knowledge of precautions;Cardiopulmonary status limiting activity       PT Treatment Interventions DME instruction;Gait training;Functional mobility training;Therapeutic activities;Therapeutic exercise;Balance training;Patient/family education    PT Goals (Current goals can be found in the Care Plan section)  Acute Rehab PT Goals Patient Stated Goal: to go home PT Goal Formulation: With patient Time For Goal Achievement: 11/02/20 Potential to Achieve Goals: Good    Frequency Min 3X/week   Barriers to discharge        Co-evaluation               AM-PAC PT "6 Clicks" Mobility  Outcome Measure Help needed turning from your back to your side while in a flat bed without  using bedrails?: None Help needed moving from lying on your back to sitting on the side of a flat bed without using bedrails?: None Help needed moving to and from a bed to a chair (including a wheelchair)?: A Little Help needed standing up from a chair using your arms (e.g., wheelchair or bedside chair)?: A Little Help needed to walk in hospital room?: A Little Help needed climbing 3-5 steps with a railing? : A Little 6 Click Score: 20    End of Session Equipment Utilized During Treatment: Gait belt;Oxygen Activity Tolerance: Patient tolerated treatment well Patient left: in chair;Other (comment) (with endo tech) Nurse Communication: Mobility status PT Visit Diagnosis: Muscle weakness (generalized) (M62.81)    Time: 3361-2244 PT Time Calculation (min) (ACUTE ONLY): 19 min   Charges:   PT Evaluation $PT Eval Moderate Complexity: 1 Mod          Knight Oelkers M,PT Acute Rehab Services [phone] (pager)   Alvira Philips 10/19/2020, 12:57 PM

## 2020-10-19 NOTE — Progress Notes (Signed)
Messaged hospitalist team. Placed serum H pylori antibody to check and treat if positive. Recommend PPI BID for at least 8 weeks, then can drop down to QD. Will follow up pathology results. Restart Eliquis in 2 days. CLD today, advance tomorrow if no additional signs of bleeding. GI will sign off for now but feel free to call if any new questions arise.

## 2020-10-19 NOTE — Anesthesia Preprocedure Evaluation (Addendum)
Anesthesia Evaluation  Patient identified by MRN, date of birth, ID band Patient awake    Reviewed: Allergy & Precautions, H&P , NPO status , Patient's Chart, lab work & pertinent test results  Airway Mallampati: II  TM Distance: >3 FB Neck ROM: full    Dental  (+) Missing, Dental Advisory Given   Pulmonary asthma , COPD, former smoker,    Pulmonary exam normal breath sounds clear to auscultation       Cardiovascular hypertension, Pt. on medications pulmonary hypertension+ Peripheral Vascular Disease and +CHF  Normal cardiovascular exam Rhythm:Regular Rate:Normal  Echo  07/2020 1. Left ventricular ejection fraction, by estimation, is 65 to 70%. The left ventricle has normal function. The left ventricle has no regional wall motion abnormalities. There is mild left ventricular hypertrophy. Left ventricular diastolic parameters are consistent with Grade I diastolic dysfunction (impaired relaxation).  2. Right ventricular systolic function is normal. The right ventricular size is normal. There is normal pulmonary artery systolic pressure. The estimated right ventricular systolic pressure is 67.5 mmHg.  3. Left atrial size was moderately dilated.  4. The mitral valve is normal in structure. No evidence of mitral valve regurgitation. No evidence of mitral stenosis.  5. The aortic valve is tricuspid. Aortic valve regurgitation is trivial. Mild aortic valve sclerosis is present, with no evidence of aortic valve stenosis.  6. Aortic dilatation noted. There is mild dilatation of the ascending aorta, measuring 37 mm.  7. The inferior vena cava is normal in size with greater than 50% respiratory variability, suggesting right atrial pressure of 3 mmHg.    Neuro/Psych  Neuromuscular disease CVA    GI/Hepatic hiatal hernia, GERD  ,  Endo/Other  diabetes  Renal/GU Renal disease     Musculoskeletal  (+) Arthritis , Osteoarthritis,     Abdominal   Peds  Hematology  (+) anemia , lymphoma   Anesthesia Other Findings   Reproductive/Obstetrics                           Anesthesia Physical  Anesthesia Plan  ASA: 3  Anesthesia Plan: MAC   Post-op Pain Management:    Induction: Intravenous  PONV Risk Score and Plan: 1 and Propofol infusion, TIVA and Treatment may vary due to age or medical condition  Airway Management Planned:   Additional Equipment:   Intra-op Plan:   Post-operative Plan:   Informed Consent: I have reviewed the patients History and Physical, chart, labs and discussed the procedure including the risks, benefits and alternatives for the proposed anesthesia with the patient or authorized representative who has indicated his/her understanding and acceptance.     Dental advisory given  Plan Discussed with: CRNA  Anesthesia Plan Comments:       Anesthesia Quick Evaluation

## 2020-10-19 NOTE — Anesthesia Postprocedure Evaluation (Signed)
Anesthesia Post Note  Patient: Ryan [name]  Procedure(s) Performed: ESOPHAGOGASTRODUODENOSCOPY (EGD) WITH PROPOFOL POLYPECTOMY HEMOSTASIS CLIP PLACEMENT     Patient location during evaluation: PACU Anesthesia Type: MAC Level of consciousness: awake and alert Pain management: pain level controlled Vital Signs Assessment: post-procedure vital signs reviewed and stable Respiratory status: spontaneous breathing Cardiovascular status: stable Anesthetic complications: no   No notable events documented.  Last Vitals:  Vitals:   10/19/20 1418 10/19/20 1428  BP: (!) 99/49 (!) 131/56  Pulse: 62 93  Resp: (!) 26 16  Temp: (!) 36.2 C   SpO2: 96% 97%    Last Pain:  Vitals:   10/19/20 1428  TempSrc:   PainSc: [place]

## 2020-10-19 NOTE — Progress Notes (Signed)
SATURATION QUALIFICATIONS: (This note is used to comply with regulatory documentation for home oxygen)  Patient Saturations on Room Air at Rest = 85%  Patient Saturations on Room Air while Ambulating = NT as desat on RA at rest  Patient Saturations on 2 Liters of oxygen while Ambulating = 88%  Please briefly explain why patient needs home oxygen:Pt needs O2 to maintain sats above 88%. Sidnie Swalley M,PT Acute Rehab Services [phone] (pager)

## 2020-10-19 NOTE — Progress Notes (Signed)
OT Cancellation Note  Patient Details Name: Ryan [name] MRN: [id number] DOB: 12/16/1931   Cancelled Treatment:    Reason Eval/Treat Not Completed: Patient at procedure or test/ unavailable. Pt just left floor for Endo. OT will follow up for Evaluation as time and schedule allow.   [name], OTR/L Acute Rehabilitation  [place] Calise Elane Yolanda Bonine 10/19/2020, 11:27 AM

## 2020-10-19 NOTE — Transfer of Care (Signed)
Immediate Anesthesia Transfer of Care Note  Patient: Ryan [name]  Procedure(s) Performed: ESOPHAGOGASTRODUODENOSCOPY (EGD) WITH PROPOFOL POLYPECTOMY HEMOSTASIS CLIP PLACEMENT  Patient Location: PACU and Endoscopy Unit  Anesthesia Type:MAC  Level of Consciousness: awake and drowsy  Airway & Oxygen Therapy: Patient Spontanous Breathing and Patient connected to nasal cannula oxygen  Post-op Assessment: Report given to RN and Post -op Vital signs reviewed and stable  Post vital signs: Reviewed and stable  Last Vitals:  Vitals Value Taken Time  BP 99/49 10/19/20 1418  Temp    Pulse 121 10/19/20 1419  Resp 19 10/19/20 1419  SpO2 93 % 10/19/20 1419  Vitals shown include unvalidated device data.  Last Pain:  Vitals:   10/19/20 1115  TempSrc: Temporal  PainSc: 3          Complications: No notable events documented.

## 2020-10-19 NOTE — Op Note (Signed)
[facility] Patient Name: Ryan [name] Procedure Date : 10/19/2020 MRN: [id number] Attending MD: Georgian Co ,  Date of Birth: 1931-09-16 CSN: [id number] Age: 87 Admit Type: Inpatient Procedure:                Upper GI endoscopy Indications:              Anemia Providers:                Adline Mango" Otto Herb, Technician,                            Dewitt Hoes, CRNA Referring MD:             Hospitalist team Medicines:                Monitored Anesthesia Care Complications:            No immediate complications. Estimated Blood Loss:     Estimated blood loss was minimal. Procedure:                After obtaining informed consent, the endoscope was                            passed under direct vision. Throughout the                            procedure, the patient's blood pressure, pulse, and                            oxygen saturations were monitored continuously. The                            [id number]-H190 ([id number] Olympus endoscope was introduced                            through the mouth, and advanced to the second part                            of duodenum. Scope In: Scope Out: Findings:      There were esophageal mucosal changes classified as Barrett's stage       C1-M3 per Prague criteria present in the lower third of the esophagus.       The maximum longitudinal extent of these mucosal changes was 3 cm in       length.      A large hiatal hernia was present.      Multiple pedunculated and sessile polyps with no bleeding and stigmata       of recent bleeding were found in the gastric fundus, in the gastric body       and in the gastric antrum. Three of these polyps had stigmata of recent       bleeding and were removed with a hot snare. Resection and retrieval were       complete. To prevent bleeding post-intervention, five hemostatic clips       were successfully placed. There was no bleeding at the end of the       procedure.       The examined duodenum was normal. Impression:               -  Esophageal mucosal changes classified as                            Barrett's stage C1-M3 per Prague criteria.                           - Large hiatal hernia.                           - Multiple gastric polyps. 3 polyps with stigmata                            of recent bleeding resected and retrieved. Clips                            were placed.                           - Normal examined duodenum. Recommendation:           - Return patient to hospital ward for ongoing care.                           - The patient's drop in Hb may be explained by the                            inflammatory polyps located in his stomach.                            Continue to monitor blood counts.                           - Clear liquid diet for today. Okay to advance diet                            tomorrow if no additional signs of bleeding.                           - Hold Eliquis for 2 days after procedure in                            setting of polypectomy                           - The findings and recommendations were discussed                            with the patient and/or primary team. Procedure Code(s):        --- Professional ---                           [id number], Esophagogastroduodenoscopy, flexible,                            transoral; with removal of tumor(s), polyp(s), or  other lesion(s) by snare technique Diagnosis Code(s):        --- Professional ---                           K22.70, Barrett's esophagus without dysplasia                           K44.9, Diaphragmatic hernia without obstruction or                            gangrene                           K31.7, Polyp of stomach and duodenum                           D64.9, Anemia, unspecified CPT copyright 2019 American Medical Association. All rights reserved. The codes documented in this report are preliminary and upon coder review may   be revised to meet current compliance requirements. Sonny Masters "Christia Reading,  10/19/2020 2:30:06 PM Number of Addenda: 0

## 2020-10-19 NOTE — Progress Notes (Signed)
Received call from endo, ok to provide am medications with a sip of water, confirm patient has working iv site and consent is signed, will confirm, estimated pick up 1230.

## 2020-10-19 NOTE — Plan of Care (Signed)
  Problem: Clinical Measurements: Goal: Will remain free from infection Outcome: Progressing Goal: Diagnostic test results will improve Outcome: Progressing Goal: Respiratory complications will improve Outcome: Progressing Goal: Cardiovascular complication will be avoided Outcome: Progressing   Problem: Pain Managment: Goal: General experience of comfort will improve Outcome: Progressing

## 2020-10-19 NOTE — Interval H&P Note (Signed)
History and Physical Interval Note:  10/19/2020 11:38 AM  Ryan [name]  has presented today for surgery, with the diagnosis of anemia, hemoccult positive stool.  The various methods of treatment have been discussed with the patient and family. After consideration of risks, benefits and other options for treatment, the patient has consented to  Procedure(s): ESOPHAGOGASTRODUODENOSCOPY (EGD) WITH PROPOFOL (N/A) as a surgical intervention.  The patient's history has been reviewed, patient examined, no change in status, stable for surgery.  I have reviewed the patient's chart and labs.  Questions were answered to the patient's satisfaction.     Sharyn Creamer

## 2020-10-20 ENCOUNTER — Ambulatory Visit (INDEPENDENT_AMBULATORY_CARE_PROVIDER_SITE_OTHER): Payer: Medicare HMO

## 2020-10-20 DIAGNOSIS — I6310 Cerebral infarction due to embolism of unspecified precerebral artery: Secondary | ICD-10-CM

## 2020-10-20 DIAGNOSIS — K922 Gastrointestinal hemorrhage, unspecified: Secondary | ICD-10-CM | POA: Diagnosis not present

## 2020-10-20 DIAGNOSIS — D649 Anemia, unspecified: Secondary | ICD-10-CM | POA: Diagnosis not present

## 2020-10-20 DIAGNOSIS — K22719 Barrett's esophagus with dysplasia, unspecified: Secondary | ICD-10-CM | POA: Diagnosis not present

## 2020-10-20 DIAGNOSIS — I10 Essential (primary) hypertension: Secondary | ICD-10-CM | POA: Diagnosis not present

## 2020-10-20 LAB — BASIC METABOLIC PANEL
Anion gap: 11 (ref 5–15)
BUN: 43 mg/dL — ABNORMAL HIGH (ref 8–23)
CO2: 32 mmol/L (ref 22–32)
Calcium: 8 mg/dL — ABNORMAL LOW (ref 8.9–10.3)
Chloride: 92 mmol/L — ABNORMAL LOW (ref 98–111)
Creatinine, Ser: 1.52 mg/dL — ABNORMAL HIGH (ref 0.61–1.24)
GFR, Estimated: 44 mL/min — ABNORMAL LOW (ref >60)
Glucose, Bld: 98 mg/dL (ref 70–99)
Potassium: 3.2 mmol/L — ABNORMAL LOW (ref 3.5–5.1)
Sodium: 135 mmol/L (ref 135–145)

## 2020-10-20 LAB — CUP PACEART REMOTE DEVICE CHECK
Date Time Interrogation Session: 20220919113726
Implantable Pulse Generator Implant Date: 20210325

## 2020-10-20 LAB — GLUCOSE, CAPILLARY
Glucose-Capillary: 117 mg/dL — ABNORMAL HIGH (ref 70–99)
Glucose-Capillary: 116 mg/dL — ABNORMAL HIGH (ref 70–99)
Glucose-Capillary: 168 mg/dL — ABNORMAL HIGH (ref 70–99)
Glucose-Capillary: 102 mg/dL — ABNORMAL HIGH (ref 70–99)

## 2020-10-20 LAB — CBC
HCT: 28.1 % — ABNORMAL LOW (ref 39.0–52.0)
Hemoglobin: 8.2 g/dL — ABNORMAL LOW (ref 13.0–17.0)
MCH: 23 pg — ABNORMAL LOW (ref 26.0–34.0)
MCHC: 29.2 g/dL — ABNORMAL LOW (ref 30.0–36.0)
MCV: 78.9 fL — ABNORMAL LOW (ref 80.0–100.0)
Platelets: 402 10*3/uL — ABNORMAL HIGH (ref 150–400)
RBC: 3.56 MIL/uL — ABNORMAL LOW (ref 4.22–5.81)
RDW: 19.1 % — ABNORMAL HIGH (ref 11.5–15.5)
WBC: 8.7 10*3/uL (ref 4.0–10.5)
nRBC: 0 % (ref 0.0–0.2)

## 2020-10-20 MED ORDER — ROPINIROLE HCL 1 MG PO TABS
2.0000 mg | ORAL_TABLET | Freq: Every day | ORAL | Status: DC | PRN
  Administered 2020-10-20: 2 mg via ORAL
  Filled 2020-10-20 (×2): qty 2

## 2020-10-20 MED ORDER — POTASSIUM CHLORIDE 20 MEQ PO PACK
40.0000 meq | PACK | Freq: Once | ORAL | Status: AC
  Administered 2020-10-20: 40 meq via ORAL
  Filled 2020-10-20: qty 2

## 2020-10-20 NOTE — Evaluation (Signed)
Occupational Therapy Evaluation Patient Details Name: Ryan [name] MRN: [id number] DOB: Jul 18, 1931 Today's Date: 10/20/2020   History of Present Illness 87 year old male admitted 9/16 with anemia and HGB drop.  Endoscopy 9/18.  PMH:   HFpEF, CVA, ILD on 2 L [place] oxygen, restless legs syndrome, HTN, HLD,  Barrett's esophagus, pulmonary hypertension, PAF on Eliquis, DM-2, CKD stage IIIa, non-Hodgkin's lymphoma (in remission), and macular degeneration (per pt report).   Clinical Impression   Patient is currently requiring min guard to minimal assistance with ADLs including toileting, LE dressing, and with bathing, and reports baseline struggles with low vision which has lately prevented pt from eating in public due to embarrassment of "being messy".  During this evaluation, patient was limited by impaired activity tolerance with need of supplemental O2, impaired dynamic standing balance, and low vision, which has the potential to impact patient's safety and independence during functional mobility, as well as performance for ADLs.  Patient lives in an ILF with PRN supervision and assistance. Patient demonstrates good rehab potential, and should benefit from continued skilled occupational therapy services while in acute care to maximize safety, independence and quality of life at home.  Continued occupational therapy services with a low vision specialist in the home is recommended, however pt does have transportation from estranged wife if outpatient is needed to access a low vision trained therapist.  ?    Recommendations for follow up therapy are one component of a multi-disciplinary discharge planning process, led by the attending physician.  Recommendations may be updated based on patient status, additional functional criteria and insurance authorization.   Follow Up Recommendations  Home health OT;Outpatient OT (Pt would benefit from [place] specialist OT. Either home health or outpatient based  on availability of therapist, but home health would be 1st choice if possible. Pt would prefer NOT to use the OT that is in-house at [facility].)    Equipment Recommendations  Other (comment) (adaptive equipment: reacher, sock aid, long shoe horn, long sponge)    Recommendations for Other Services       Precautions / Restrictions Precautions Precautions: Fall Restrictions Weight Bearing Restrictions: No Other Position/Activity Restrictions: Vitals monitored and stable throughout visit on 2L O2 via [place]      Mobility Bed Mobility               General bed mobility comments: Sitting EOB as OT entered.    Transfers Overall transfer level: Needs assistance Equipment used: None Transfers: Sit to/from Stand Sit to Stand: Supervision         General transfer comment: Pt ambulated in room and bathroom, touching surfaces as needed but no loss of balance or use of AD. supervision for safety.    Balance Overall balance assessment: Needs assistance;History of Falls (Pt endorsed one fall in past year "tripped on a wire".) Sitting-balance support: Feet supported;No upper extremity supported Sitting balance-Leahy Scale: Good     Standing balance support: During functional activity Standing balance-Leahy Scale: Fair                             ADL either performed or assessed with clinical judgement   ADL Overall ADL's : At baseline                                       General ADL Comments: Pt uses  his "back scratcher" at home to assist with LB ADLs. Pt does not have this available in hospital. Pt reports that he has minimized dining room visits and starting eat in because of low vision and "being messy when I eat". Pt educated on compensatory vision strategies including use of high contrast (white plate on black place mat for example) and direct lighting rather than overhead. Pt expressed interest in a low vision therapist coming to his apartment for  training. Pt may also benefit from adaptive equipment training to assist with donning socks and shoes with more ease.  Pt did stand at sink with supervision adn verbal cues to locate items and perform oral hygiene with supervision.     Vision Baseline Vision/History: 6 Macular Degeneration;1 Wears glasses Ability to See in Adequate Light: 2 Moderately impaired Patient Visual Report: Central vision impairment;No change from baseline Additional Comments: Pt reports that his mac degeneration seems to be progressing quickly. Pt wears trifocals.     Perception     Praxis      Pertinent Vitals/Pain Pain Assessment: 0-10 Pain Score: 1  Pain Location: low back chronic Pain Intervention(s): Limited activity within patient's tolerance;Monitored during session;Repositioned (Education per above)     Hand Dominance Right   Extremity/Trunk Assessment Upper Extremity Assessment Upper Extremity Assessment: Overall WFL for tasks assessed   Lower Extremity Assessment Lower Extremity Assessment: Generalized weakness   Cervical / Trunk Assessment Cervical / Trunk Assessment: Normal   Communication Communication Communication: No difficulties   Cognition Arousal/Alertness: Awake/alert Behavior During Therapy: WFL for tasks assessed/performed Overall Cognitive Status: Within Functional Limits for tasks assessed                                     General Comments       Exercises     Shoulder Instructions      Home Living Family/patient expects to be discharged to:: Private residence Living Arrangements: Alone Available Help at Discharge: Family;Available PRN/intermittently Type of Home: Independent living facility Home Access: Level entry     Home Layout: One level     Bathroom Shower/Tub: Occupational psychologist: Handicapped height     Home Equipment: Walker - 4 wheels;Grab bars - toilet;Grab bars - tub/shower;Shower seat;Electric scooter (Sleep in  lift chair.  Pt's wife who lives seperately provides transportation.)   Additional Comments: pt married but wife lives separately but she still makes arrangements for pt and take him to appts      Prior Functioning/Environment Level of Independence: Independent with assistive device(s);Needs assistance  Gait / Transfers Assistance Needed: walked wihtout the device in the apt, used rollator in facility and scooter in community or to take long hallway to dining room. ADL's / Homemaking Assistance Needed: Performs own Adls per pt. Pt reports that he attends the exercises classes and likes to stretch and exercise. Pt reports that he has had significant trouble finding shoes that fit him. He usually just wears his hospital socks. Pt given education on link to proper footwear to optimize posture and to prevent lower back pain from which pt reports suffering from chronically. Pt educated on some full service shoe stores in town that can evaluate his feet and make recommendations.   Comments: facility provided wellness checks every 3 hours even in independent living. pt cooks his own meals and completes his own baths - gives pt pills and take out trash -  [place]        OT Problem List: Decreased knowledge of use of DME or AE;Impaired vision/perception;Impaired balance (sitting and/or standing)      OT Treatment/Interventions: Self-care/ADL training;Visual/perceptual remediation/compensation;Energy conservation;Therapeutic activities;DME and/or AE instruction;Balance training    OT Goals(Current goals can be found in the care plan section) Acute Rehab OT Goals Patient Stated Goal: To find a Low vision specialist for at-home training. OT Goal Formulation: With patient Time For Goal Achievement: 11/03/20 Potential to Achieve Goals: Good ADL Goals Pt Will Perform Lower Body Dressing: with adaptive equipment;with modified independence;sitting/lateral leans;sit to/from stand Additional ADL Goal #1:  Pt will engage in 10+ min standing functional activities without loss of balance, in order to demonstrate improved activity tolerance and balance needed to perform ADLs safely at home. Additional ADL Goal #2: Pt will verbalize at least 3 compensatory strategies to increase function and/or safety at home with low vision.  OT Frequency: Min 2X/week   Barriers to D/C:            Co-evaluation              AM-PAC OT "6 Clicks" Daily Activity     Outcome Measure Help from another person eating meals?: None Help from another person taking care of personal grooming?: A Little Help from another person toileting, which includes using toliet, bedpan, or urinal?: A Little Help from another person bathing (including washing, rinsing, drying)?: A Little Help from another person to put on and taking off regular upper body clothing?: A Little Help from another person to put on and taking off regular lower body clothing?: A Little 6 Click Score: 19   End of Session Equipment Utilized During Treatment: Oxygen Nurse Communication:  (RN cleared OT to see pt)  Activity Tolerance: Patient tolerated treatment well Patient left: in bed (EOB)  OT Visit Diagnosis: History of falling (Z91.81);Low vision, both eyes (H54.2);Unsteadiness on feet (R26.81)                Time: 9678-9381 OT Time Calculation (min): 32 min Charges:  OT General Charges $OT Visit: 1 Visit OT Evaluation $OT Eval Low Complexity: 1 Low OT Treatments $Self Care/Home Management : 8-22 mins  Anderson Malta, OT Acute Rehab Services Office: [phone] 10/20/2020  Julien Girt 10/20/2020, 12:32 PM

## 2020-10-20 NOTE — Plan of Care (Signed)
Pt alert and oriented during shift, reported chronic pain to bilateral legs, prn requip order placed via pharmacy, 02_0  via [place], condom cath in use, ambulates to restroom, continent of bowel and bladder, pivx2 flushed, call bell within reach, bed in locked and lowered position, sbar to be provided to on coming nurse.   Problem: Health Behavior/Discharge Planning: Goal: Ability to manage health-related needs will improve Outcome: Progressing   Problem: Clinical Measurements: Goal: Ability to maintain clinical measurements within normal limits will improve Outcome: Progressing   Problem: Activity: Goal: Risk for activity intolerance will decrease Outcome: Progressing   Problem: Nutrition: Goal: Adequate nutrition will be maintained Outcome: Progressing

## 2020-10-20 NOTE — Progress Notes (Signed)
PROGRESS NOTE        PATIENT DETAILS Name: Ryan [name] Age: 87 y.o. Sex: male Date of Birth: 1931-04-12 Admit Date: 10/17/2020 Admitting Physician Orma Flaming, MD CBS:[id number], Rocco Pauls, NP  Brief Narrative: Patient is a 87 y.o. male with history of HTN, HLD, RLS, Barrett's esophagus, pulmonary hypertension, HFpEF, PAF on Eliquis, DM-2, CKD stage IIIa, non-Hodgkin's lymphoma (in remission) ILD with chronic hypoxic respiratory failure on home O2 2 L/minute-referred to the ED by PCP for a hemoglobin of 6.5.  See below for further details.  Subjective: Lying comfortably in bed.  No chest pain or shortness of breath.  Objective: Vitals: Blood pressure (!) 94/58, pulse 93, temperature 97.6 F (36.4 C), temperature source Oral, resp. rate 20, height _0  (1.702 m), weight 82.4 kg, SpO2 96 %.   Exam: Gen Exam:Alert awake-not in any distress HEENT:atraumatic, normocephalic Chest: B/L clear to auscultation anteriorly CVS:S1S2 regular Abdomen:soft non tender, non distended Extremities:no edema Neurology: Non focal Skin: no rash   Pertinent Labs/Radiology: Hb: 8.2 K: 3.2 Creatinine: 1.52.  WBC: 8.9  CXR: Large hiatal hernia   Assessment/Plan: Upper GI bleeding with acute blood loss anemia: Felt to be due to gastric polyps.  Underwent EGD on 9/18 which showed several polyps with recent stigmata of bleeding.  Underwent polypectomy with EGD.  Hemoglobin stable overnight.  Remains on PPI.  GI recommending PPI twice daily for 8 weeks-followed by daily.  Recommendations are to restart Eliquis in 2 days.  Was on clear liquids overnight-we will advance diet and plan on repeating CBC tomorrow morning.  If stable-suspect can be discharged home on 9/20.  History of Barrett's esophagus: On PPI  CKD stage IIIa: Close to baseline-watch closely as patient on diuretics.  HFpEF: Volume status stable-continue Demadex/metolazone/Aldactone  Pulmonary  hypertension: Continue selexipag and Adempas.  Appears to qualify for home O2-suspect has (undiagnosed) chronic hypoxic respiratory failure at baseline-we will order home O2.  PAF: Continue Cardizem-Eliquis on hold-Per GI okay to resume after 2 days following EGD (on 9/18)  DM-2 (A1c 5.9 on 9/16): CBG stable with SSI  Recent Labs    10/19/20 1803 10/19/20 2153 10/20/20 0755  GLUCAP 109* 152* 117*     HLD: Continue statin  RLS: Continue Requip  Procedures :None Consults: GI DVT Prophylaxis: None Code Status:Full code Family Communication: Spouse over the phone on 9/18.  Time spent: 25 minutes-Greater than 50% of this time was spent in counseling, explanation of diagnosis, planning of further management, and coordination of care.  Diet: Diet Order             Diet full liquid Room service appropriate? Yes; Fluid consistency: Thin  Diet effective now                      Disposition Plan: Status is: Inpatient  Remains inpatient appropriate because:Inpatient level of care appropriate due to severity of illness  Dispo: The patient is from: Home              Anticipated d/c is to: Home              Patient currently is not medically stable to d/c.   Difficult to place patient No    Barriers to Discharge: Advance diet-repeat CBC tomorrow morning-is stable-plan on discharge 9/20.  Antimicrobial agents: Anti-infectives (From admission, onward)  None        MEDICATIONS: Scheduled Meds:  atorvastatin  40 mg Oral q1800   diltiazem  120 mg Oral Daily   escitalopram  10 mg Oral Daily   influenza vaccine adjuvanted  0.5 mL Intramuscular Tomorrow-1000   insulin aspart  0-9 Units Subcutaneous TID WC   melatonin  10 mg Oral QHS   metolazone  2.5 mg Oral Q14 Days   pantoprazole (PROTONIX) IV  40 mg Intravenous Q12H   polyethylene glycol  17 g Oral BID   Riociguat  2.5 mg Oral TID   rOPINIRole  2 mg Oral TID   Selexipag  1,600 mcg Oral BID   Selexipag  400  mcg Oral BID   senna  2 tablet Oral QHS   sodium chloride flush  3 mL Intravenous Q12H   spironolactone  12.5 mg Oral q AM   torsemide  100 mg Oral BID   traZODone  100 mg Oral QHS   Continuous Infusions:  sodium chloride     PRN Meds:.sodium chloride, acetaminophen **OR** acetaminophen, bisacodyl, polyvinyl alcohol, sodium chloride flush, traMADol   I have personally reviewed following labs and imaging studies  LABORATORY DATA: CBC: Recent Labs  Lab 10/17/20 1526 10/17/20 1821 10/18/20 0348 10/18/20 0754 10/18/20 1458 10/19/20 0112 10/20/20 0324  WBC 9.9  --  8.3 7.5 9.5 8.9 8.7  NEUTROABS 7.7  --   --   --   --   --   --   HGB 7.0*   < > 7.1* 7.4* 8.5* 8.2* 8.2*  HCT 23.7*   < > 22.9* 24.6* 28.1* 27.9* 28.1*  MCV 76.7*  --  76.6* 77.8* 78.1* 78.4* 78.9*  PLT 418*  --  377 382 410* 375 402*   < > = values in this interval not displayed.     Basic Metabolic Panel: Recent Labs  Lab 10/17/20 1526 10/18/20 0348 10/19/20 0112 10/20/20 0324  NA 133* 137 137 135  K 3.5 3.2* 4.2 3.2*  CL 88* 92* 92* 92*  CO2 33* 32 34* 32  GLUCOSE 126* 78 87 98  BUN 88* 73* 55* 43*  CREATININE 2.25* 1.83* 1.67* 1.52*  CALCIUM 7.6* 7.5* 7.9* 8.0*     GFR: Estimated Creatinine Clearance: 35.8 mL/min (A) (by C-G formula based on SCr of 1.52 mg/dL (H)).  Liver Function Tests: Recent Labs  Lab 10/17/20 1526 10/18/20 0348  AST 15 13*  ALT 12 11  ALKPHOS 100 79  BILITOT 0.7 0.6  PROT 6.8 5.8*  ALBUMIN 3.6 3.1*    No results for input(s): LIPASE, AMYLASE in the last 168 hours. No results for input(s): AMMONIA in the last 168 hours.  Coagulation Profile: No results for input(s): INR, PROTIME in the last 168 hours.  Cardiac Enzymes: No results for input(s): CKTOTAL, CKMB, CKMBINDEX, TROPONINI in the last 168 hours.  BNP (last 3 results) No results for input(s): PROBNP in the last 8760 hours.  Lipid Profile: No results for input(s): CHOL, HDL, LDLCALC, TRIG, CHOLHDL,  LDLDIRECT in the last 72 hours.  Thyroid Function Tests: No results for input(s): TSH, T4TOTAL, FREET4, T3FREE, THYROIDAB in the last 72 hours.  Anemia Panel: No results for input(s): VITAMINB12, FOLATE, FERRITIN, TIBC, IRON, RETICCTPCT in the last 72 hours.  Urine analysis:    Component Value Date/Time   COLORURINE STRAW (A) 07/13/2019 1041   APPEARANCEUR CLEAR 07/13/2019 1041   LABSPEC 1.008 07/13/2019 1041   LABSPEC 1.025 06/28/2013 1321   PHURINE 6.0 07/13/2019 1041  GLUCOSEU NEGATIVE 07/13/2019 1041   GLUCOSEU Negative 06/28/2013 1321   HGBUR NEGATIVE 07/13/2019 [place] 07/13/2019 1041   BILIRUBINUR Negative 06/28/2013 1321   KETONESUR NEGATIVE 07/13/2019 1041   PROTEINUR NEGATIVE 07/13/2019 1041   UROBILINOGEN 1.0 01/04/2014 2156   UROBILINOGEN 0.2 06/28/2013 1321   NITRITE NEGATIVE 07/13/2019 1041   LEUKOCYTESUR NEGATIVE 07/13/2019 1041   LEUKOCYTESUR Negative 06/28/2013 1321    Sepsis Labs: Lactic Acid, Venous    Component Value Date/Time   LATICACIDVEN 0.6 04/30/2019 1424    MICROBIOLOGY: Recent Results (from the past 240 hour(s))  Resp Panel by RT-PCR (Flu A&B, Covid) Nasopharyngeal Swab     Status: None   Collection Time: 10/17/20  5:13 PM   Specimen: Nasopharyngeal Swab; Nasopharyngeal(NP) swabs in vial transport medium  Result Value Ref Range Status   SARS Coronavirus 2 by RT PCR NEGATIVE NEGATIVE Final    Comment: (NOTE) SARS-CoV-2 target nucleic acids are NOT DETECTED.  The SARS-CoV-2 RNA is generally detectable in upper respiratory specimens during the acute phase of infection. The lowest concentration of SARS-CoV-2 viral copies this assay can detect is 138 copies/mL. A negative result does not preclude SARS-Cov-2 infection and should not be used as the sole basis for treatment or other patient management decisions. A negative result may occur with  improper specimen collection/handling, submission of specimen other than  nasopharyngeal swab, presence of viral mutation(s) within the areas targeted by this assay, and inadequate number of viral copies(<138 copies/mL). A negative result must be combined with clinical observations, patient history, and epidemiological information. The expected result is Negative.  Fact Sheet for Patients:  EntrepreneurPulse.com.au  Fact Sheet for Healthcare Providers:  IncredibleEmployment.be  This test is no t yet approved or cleared by the [place] FDA and  has been authorized for detection and/or diagnosis of SARS-CoV-2 by FDA under an Emergency Use Authorization (EUA). This EUA will remain  in effect (meaning this test can be used) for the duration of the COVID-19 declaration under Section 564(b)(1) of the Act, 21 U.S.C.section 360bbb-3(b)(1), unless the authorization is terminated  or revoked sooner.       Influenza A by PCR NEGATIVE NEGATIVE Final   Influenza B by PCR NEGATIVE NEGATIVE Final    Comment: (NOTE) The Xpert Xpress SARS-CoV-2/FLU/RSV plus assay is intended as an aid in the diagnosis of influenza from Nasopharyngeal swab specimens and should not be used as a sole basis for treatment. Nasal washings and aspirates are unacceptable for Xpert Xpress SARS-CoV-2/FLU/RSV testing.  Fact Sheet for Patients: EntrepreneurPulse.com.au  Fact Sheet for Healthcare Providers: IncredibleEmployment.be  This test is not yet approved or cleared by the [place] FDA and has been authorized for detection and/or diagnosis of SARS-CoV-2 by FDA under an Emergency Use Authorization (EUA). This EUA will remain in effect (meaning this test can be used) for the duration of the COVID-19 declaration under Section 564(b)(1) of the Act, 21 U.S.C. section 360bbb-3(b)(1), unless the authorization is terminated or revoked.  Performed at [facility], [place] [address], [place], [place] [address]    MRSA Next Gen by PCR, Nasal     Status: None   Collection Time: 10/18/20  5:22 AM   Specimen: Nasal Mucosa; Nasal Swab  Result Value Ref Range Status   MRSA by PCR Next Gen NOT DETECTED NOT DETECTED Final    Comment: (NOTE) The GeneXpert MRSA Assay (FDA approved for NASAL specimens only), is one component of a comprehensive MRSA colonization surveillance program. It  is not intended to diagnose MRSA infection nor to guide or monitor treatment for MRSA infections. Test performance is not FDA approved in patients less than 68 years old. Performed at [facility], [place] [address], [place], [place] [address]     RADIOLOGY STUDIES/RESULTS: No results found.   LOS: 3 days   Oren Binet, MD  [facility]    To contact the attending provider between 7A-7P or the covering provider during after hours 7P-7A, please log into the web site [email or web address] and access using universal  password for that web site. If you do not have the password, please call the hospital operator.  10/20/2020, 11:53 AM

## 2020-10-20 NOTE — Plan of Care (Signed)
  Problem: Education: Goal: Knowledge of General Education information will improve Description: Including pain rating scale, medication(s)/side effects and non-pharmacologic comfort measures Outcome: Progressing

## 2020-10-20 NOTE — Care Management Important Message (Signed)
Important Message  Patient Details  Name: Ryan [name] MRN: [id number] Date of Birth: October 07, 1931   Medicare Important Message Given:  Yes     Memory Argue 10/20/2020, 1:17 PM

## 2020-10-21 DIAGNOSIS — D649 Anemia, unspecified: Secondary | ICD-10-CM | POA: Diagnosis not present

## 2020-10-21 DIAGNOSIS — K922 Gastrointestinal hemorrhage, unspecified: Secondary | ICD-10-CM | POA: Diagnosis not present

## 2020-10-21 DIAGNOSIS — I5032 Chronic diastolic (congestive) heart failure: Secondary | ICD-10-CM | POA: Diagnosis not present

## 2020-10-21 DIAGNOSIS — I10 Essential (primary) hypertension: Secondary | ICD-10-CM | POA: Diagnosis not present

## 2020-10-21 LAB — CBC
HCT: 28.4 % — ABNORMAL LOW (ref 39.0–52.0)
Hemoglobin: 8.6 g/dL — ABNORMAL LOW (ref 13.0–17.0)
MCH: 23.9 pg — ABNORMAL LOW (ref 26.0–34.0)
MCHC: 30.3 g/dL (ref 30.0–36.0)
MCV: 78.9 fL — ABNORMAL LOW (ref 80.0–100.0)
Platelets: 393 10*3/uL (ref 150–400)
RBC: 3.6 MIL/uL — ABNORMAL LOW (ref 4.22–5.81)
RDW: 19.5 % — ABNORMAL HIGH (ref 11.5–15.5)
WBC: 8.1 10*3/uL (ref 4.0–10.5)
nRBC: 0 % (ref 0.0–0.2)

## 2020-10-21 LAB — H. PYLORI ANTIBODY, IGG: H Pylori IgG: 0.24 Index Value (ref 0.00–0.79)

## 2020-10-21 LAB — GLUCOSE, CAPILLARY
Glucose-Capillary: 86 mg/dL (ref 70–99)
Glucose-Capillary: 97 mg/dL (ref 70–99)

## 2020-10-21 LAB — LACTIC ACID, PLASMA: Lactic Acid, Venous: 0.6 mmol/L (ref 0.5–1.9)

## 2020-10-21 LAB — SURGICAL PATHOLOGY

## 2020-10-21 MED ORDER — APIXABAN 2.5 MG PO TABS: 2.5000 mg | ORAL_TABLET | Freq: Two times a day (BID) | ORAL | 11 refills | Status: DC

## 2020-10-21 MED ORDER — PANTOPRAZOLE SODIUM 40 MG PO TBEC: 40.0000 mg | DELAYED_RELEASE_TABLET | Freq: Two times a day (BID) | ORAL | 1 refills | Status: DC

## 2020-10-21 MED ORDER — SODIUM CHLORIDE 0.9 % IV BOLUS: 250.0000 mL | Freq: Once | INTRAVENOUS | Status: DC

## 2020-10-21 NOTE — TOC Transition Note (Signed)
Transition of Care [facility]) - CM/SW Discharge Note   Patient Details  Name: DESTINY TRICKEY MRN: [id number] Date of Birth: 04-15-1931  [facility]) CM/SW Contact:  Carles Collet, RN Phone Number: 10/21/2020, 12:13 PM   Clinical Narrative:    Spoke with patient at bedside. He states that he would like to continue [facility] services w [place] at [employer]. Notified liaison, Rollene Fare of [place] today. Faxed referral to her at [phone]. Patient has oxygen through [employer] already, notifed [employer] liaison of order to update their records.  Patient states that his wife will be picking him up from the hospital. No other TOC needs identified.     Final next level of care: [place] Barriers to Discharge: No Barriers Identified   Patient Goals and CMS Choice Patient states their goals for this hospitalization and ongoing recovery are:: to return to [place] CMS Medicare.gov Compare Post Acute Care list provided to:: Patient    Discharge Placement                       Discharge Plan and Services                            [place]:  Secondary school teacher at [employer]) Date Protection: 10/21/20 Time [place]: 1155 Representative spoke with at [place]: [place] (SDOH) Interventions     Readmission Risk Interventions Readmission Risk Prevention Plan 09/26/2018 09/26/2018 09/12/2018  Transportation Screening Complete - Complete  PCP or Specialist Appt within 3-5 Days - - Complete  HRI or Home Care Consult - - Complete  Social Work Consult for [place] Planning/Counseling - - Complete  Palliative Care Screening - - Not Applicable  Medication Review Press photographer) Complete - Complete  PCP or Specialist appointment within 3-5 days of discharge Complete - -  [place] or Home Care Consult Complete - -  SW Recovery Care/Counseling Consult Complete - -  Palliative Care Screening Complete Complete -   Skilled Nursing Facility Complete Complete -  Some recent data might be hidden

## 2020-10-21 NOTE — Progress Notes (Signed)
Informed Dr. Sloan Leiter of pt's low blood pressures. Dr Sloan Leiter stated that pt reports that his blood pressures are chronically lower and as long as pt is now symptomatic then nothing to do at this time. Pt denies any lightheadedness or dizziness.

## 2020-10-21 NOTE — Progress Notes (Signed)
Discharge paperwork reviewed with pt. Pt verbalized understanding. Home meds have been returned to pt. Awaiting pt's wife to pick up pt. Wife reported she'd be here around 1400 to pick up pt.

## 2020-10-21 NOTE — Progress Notes (Signed)
Pt stated that his wife will be picking him up to transport back to [place]. Called pt's wife, Shraga Custard but no answer. Left wife a voicemail with number to call back.

## 2020-10-21 NOTE — Progress Notes (Signed)
Physical Therapy Treatment Patient Details Name: Ryan [name] MRN: [id number] DOB: [birth year] Today's Date: 10/21/2020   History of Present Illness 87 year old male admitted 9/16 with anemia and HGB drop.  Endoscopy 9/18.  PMH:   HFpEF, CVA, ILD on 2 L [place] oxygen, restless legs syndrome, HTN, HLD,  Barrett's esophagus, pulmonary hypertension, PAF on Eliquis, DM-2, CKD stage IIIa, non-Hodgkin's lymphoma (in remission), and macular degeneration (per pt report).    PT Comments    Patient mobilizing well with RW (supervision due to lines) with no imbalance with multiple turns and transfers from bed and toilet. Patient feels OK going home and using his RW until he regains his strength "from getting weak here in the hospital."    Recommendations for follow up therapy are one component of a multi-disciplinary discharge planning process, led by the attending physician.  Recommendations may be updated based on patient status, additional functional criteria and insurance authorization.  Follow Up Recommendations  Home health PT     Equipment Recommendations  None recommended by PT    Recommendations for Other Services       Precautions / Restrictions Precautions Precautions: Fall Restrictions Other Position/Activity Restrictions: Vitals monitored and stable throughout visit on 2L O2 via [place]     Mobility  Bed Mobility Overal bed mobility: Independent                  Transfers Overall transfer level: Modified independent Equipment used: Rolling walker (2 wheeled) Transfers: Sit to/from Stand Sit to Stand: Modified independent (Device/Increase time)         General transfer comment: pt recognized his need for RW instead of trying to hold onto objects in room  Ambulation/Gait Ambulation/Gait assistance: Min guard Gait Distance (Feet): 100 Feet Assistive device: Rolling walker (2 wheeled) Gait Pattern/deviations: Step-through pattern;Decreased stride length      General Gait Details: Able to walk multiple laps in room (did not want to wear a mask to walk in hallway); also in/out of bathroom; on/off toilet; no imbalance, however pt reports feels weak enough that he prefers use of RW   Stairs             Wheelchair Mobility    Modified Rankin (Stroke Patients Only)       Balance Overall balance assessment: Needs assistance;History of Falls (Pt endorsed one fall in past year "tripped on a wire".) Sitting-balance support: Feet supported;No upper extremity supported Sitting balance-Leahy Scale: Good     Standing balance support: During functional activity Standing balance-Leahy Scale: Fair Standing balance comment: bimanual task at sink without UE support                            Cognition Arousal/Alertness: Awake/alert Behavior During Therapy: WFL for tasks assessed/performed Overall Cognitive Status: Within Functional Limits for tasks assessed                                        Exercises General Exercises - Lower Extremity Hip Flexion/Marching: AROM;Both;Standing    General Comments General comments (skin integrity, edema, etc.): on 2L; pulse ox not consistently reading, but when accurate >=91%      Pertinent Vitals/Pain Pain Assessment: 0-10 Pain Score: 1  Faces Pain Scale: Hurts even more Pain Location: low back chronic and rt gluteal muscle Pain Descriptors / Indicators: Aching;Grimacing;Guarding Pain Intervention(s): Limited  activity within patient's tolerance;Monitored during session;Repositioned    Home Living                      Prior Function            PT Goals (current goals can now be found in the care plan section) Acute Rehab PT Goals Patient Stated Goal: To find a Low vision specialist for at-home training. Time For Goal Achievement: 11/02/20 Potential to Achieve Goals: Good Progress towards PT goals: Progressing toward goals    Frequency    Min  3X/week      PT Plan Current plan remains appropriate    Co-evaluation              AM-PAC PT "6 Clicks" Mobility   Outcome Measure  Help needed turning from your back to your side while in a flat bed without using bedrails?: None Help needed moving from lying on your back to sitting on the side of a flat bed without using bedrails?: None Help needed moving to and from a bed to a chair (including a wheelchair)?: None Help needed standing up from a chair using your arms (e.g., wheelchair or bedside chair)?: None Help needed to walk in hospital room?: A Little Help needed climbing 3-5 steps with a railing? : A Little 6 Click Score: 22    End of Session Equipment Utilized During Treatment: Oxygen (pt refused gait belt) Activity Tolerance: Patient tolerated treatment well Patient left: in chair;with call bell/phone within reach   PT Visit Diagnosis: Muscle weakness (generalized) (M62.81)     Time: 9977-4142 PT Time Calculation (min) (ACUTE ONLY): 20 min  Charges:  $Gait Training: 8-22 mins                      Arby Barrette, PT Pager [phone]    Rexanne Mano 10/21/2020, 11:46 AM

## 2020-10-21 NOTE — Progress Notes (Signed)
Pt's wife has arrived. Pt taken to main entrance via wheelchair by NT. Pt has taken all belongings with him. Pt alert and oriented x4 in no acute distress upon discharge.

## 2020-10-21 NOTE — Discharge Summary (Addendum)
PATIENT DETAILS Name: ENDY EASTERLY Age: 87 y.o. Sex: male Date of Birth: 02-14-1931 MRN: [id number]. Admitting Physician: Orma Flaming, MD MAU:[id number], Rocco Pauls, NP  Admit Date: 10/17/2020 Discharge date: 10/21/2020  Recommendations for Outpatient Follow-up:  Follow up with PCP in 1-2 weeks Please ensure follow-up with cardiology/gastroenterology Repeat CBC/chemistry panel in 1 week Serum H. pylori antibody pending-please follow. Resume Eliquis 9/21  Admitted From:  ILF  Disposition: ILF with home health services  Home Health: Yes  Equipment/Devices: None  Discharge Condition: Stable  CODE STATUS: FULL CODE  Diet recommendation:  Diet Order             Diet - low sodium heart healthy           Diet Carb Modified Fluid consistency: Thin; Room service appropriate? Yes  Diet effective now                    Brief Summary: Patient is a 87 y.o. male with history of HTN, HLD, RLS, Barrett's esophagus, pulmonary hypertension, HFpEF, PAF on Eliquis, DM-2, CKD stage IIIa, non-Hodgkin's lymphoma (in remission) ILD with chronic hypoxic respiratory failure on home O2 2 L/minute-referred to the ED by PCP for a hemoglobin of 6.5.  See below for further details.  Brief Hospital Course: Upper GI bleeding with acute blood loss anemia: Felt to be due to gastric polyps.  Underwent EGD on 9/18 which showed several polyps with recent stigmata of bleeding.  Underwent polypectomy with EGD.  Hemoglobin stable overnight.  Remains on PPI.  GI recommending PPI twice daily for 8 weeks-followed by daily.  Recommendations are to restart Eliquis in 2 days.  Vance benign diet.  EGD/biopsy results showed hyperplastic polyps.    History of Barrett's esophagus: On PPI   CKD stage IIIa: Close to baseline-watch closely as patient on diuretics.   HFpEF: Volume status stable-continue Demadex/metolazone/Aldactone   Pulmonary hypertension with chronic hypoxic respiratory failure on  home O2 (uses intermittently): Continue selexipag and Adempas.  Home O2 on discharge.  His blood pressure has been soft-but he is asymptomatic-Per patient-he has chronic hypotension-he has an appointment with his primary CHF MD-Dr. Aundra Dubin coming up on 9/22-he has been asked to keep that appointment-in the meantime-we will stop his Cardizem (discussed with CHF team PA-C. Ellen Henri).   PAF: Continue -Eliquis on hold-Per GI okay to resume after 2 days following EGD (on 9/18).  Cardizem on hold.   DM-2 (A1c 5.9 on 9/16): Diet controlled at home-continue follow-up with PCP   HLD: Continue statin   RLS: Continue Requip   Procedures 9/18>>EGD  Discharge Diagnoses:  Active Problems:   Hyperlipidemia   BPH (benign prostatic hyperplasia)   Chronic venous insufficiency   Barrett's esophagus   Non Hodgkin's lymphoma (HCC)   Essential hypertension   Asthma/COPD   CKD (chronic kidney disease) stage 3, GFR 30-59 ml/min (HCC)   Chronic diastolic CHF (congestive heart failure) (HCC)   Diabetic polyneuropathy associated with type 2 diabetes mellitus (HCC)   GI bleed   Symptomatic anemia   Discharge Instructions:  Activity:  As tolerated   Discharge Instructions     Call MD for:   Complete by: As directed    Black or bloody stools.   Diet - low sodium heart healthy   Complete by: As directed    Discharge instructions   Complete by: As directed    Follow with Primary MD  Roetta Sessions, NP in 1-2 weeks  Please get a complete  blood count and chemistry panel checked by your Primary MD at your next visit, and again as instructed by your Primary MD.  Get Medicines reviewed and adjusted: Please take all your medications with you for your next visit with your Primary MD  Laboratory/radiological data: Please request your Primary MD to go over all hospital tests and procedure/radiological results at the follow up, please ask your Primary MD to get all Hospital records sent to  his/her office.  In some cases, they will be blood work, cultures and biopsy results pending at the time of your discharge. Please request that your primary care M.D. follows up on these results.  Also Note the following: If you experience worsening of your admission symptoms, develop shortness of breath, life threatening emergency, suicidal or homicidal thoughts you must seek medical attention immediately by calling 911 or calling your MD immediately  if symptoms less severe.  You must read complete instructions/literature along with all the possible adverse reactions/side effects for all the Medicines you take and that have been prescribed to you. Take any new Medicines after you have completely understood and accpet all the possible adverse reactions/side effects.   Do not drive when taking Pain medications or sleeping medications (Benzodaizepines)  Do not take more than prescribed Pain, Sleep and Anxiety Medications. It is not advisable to combine anxiety,sleep and pain medications without talking with your primary care practitioner  Special Instructions: If you have smoked or chewed Tobacco  in the last 2 yrs please stop smoking, stop any regular Alcohol  and or any Recreational drug use.  Wear Seat belts while driving.  Please note: You were cared for by a hospitalist during your hospital stay. Once you are discharged, your primary care physician will handle any further medical issues. Please note that NO REFILLS for any discharge medications will be authorized once you are discharged, as it is imperative that you return to your primary care physician (or establish a relationship with a primary care physician if you do not have one) for your post hospital discharge needs so that they can reassess your need for medications and monitor your lab values.   Increase activity slowly   Complete by: As directed       Allergies as of 10/21/2020       Reactions   Pollen Extract-tree Extract  Other (See Comments)   "HEADACHES, TIRED, DRAINAGE FROM SINUSES"   Molds & Smuts Other (See Comments)   Also dust mites causes sinus infections, h/a etc.        Medication List     STOP taking these medications    diltiazem 120 MG 24 hr capsule Commonly known as: CARDIZEM CD   eplerenone 25 MG tablet Commonly known as: INSPRA   omeprazole 20 MG capsule Commonly known as: PRILOSEC       TAKE these medications    acetaminophen 500 MG tablet Commonly known as: TYLENOL Take 500 mg by mouth every 6 (six) hours as needed for mild pain or headache.   Adempas 2.5 MG Tabs Generic drug: Riociguat Take 2.5 mg by mouth 3 (three) times daily.   apixaban 2.5 MG Tabs tablet Commonly known as: Eliquis Take 1 tablet (2.5 mg total) by mouth 2 (two) times daily. Start taking on: October 22, 2020   atorvastatin 40 MG tablet Commonly known as: LIPITOR Take 1 tablet (40 mg total) by mouth daily at 6 PM.   Citrucel oral powder Generic drug: methylcellulose Mix 2 grams in 8 ounces of  water and drink twice a day. What changed:  when to take this additional instructions   cyanocobalamin 1000 MCG/ML injection Commonly known as: (VITAMIN B-12) INJECT 1ML INTRAMUSCULARLY ONCE A WEEK FOR 3 WEEKS THEN MONTHLY THERAFTER What changed:  how much to take how to take this when to take this additional instructions   escitalopram 10 MG tablet Commonly known as: LEXAPRO Take 10 mg by mouth daily.   FeroSul 325 (65 FE) MG tablet Generic drug: ferrous sulfate Take 325 mg by mouth 2 (two) times daily with a meal.   finasteride 5 MG tablet Commonly known as: PROSCAR Take 5 mg by mouth daily.   Flonase Sensimist 27.5 MCG/SPRAY nasal spray Generic drug: fluticasone Place 1 spray into the nose daily as needed for rhinitis or allergies.   Melatonin 10 MG Tabs Take 10 mg by mouth at bedtime.   metolazone 2.5 MG tablet Commonly known as: ZAROXOLYN Take every other Monday What  changed:  how much to take how to take this when to take this additional instructions   NON FORMULARY Take 1 tablet by mouth See admin instructions. Unnamed laxative: Take 1 tablet by mouth at bedtime as needed for iron-induced constipation   pantoprazole 40 MG tablet Commonly known as: Protonix Take 1 tablet (40 mg total) by mouth 2 (two) times daily. Take twice daily for 8 weeks and then change in dosing to once daily.   rOPINIRole 2 MG tablet Commonly known as: REQUIP Take 2 mg by mouth See admin instructions. Take 2 mg by mouth three times a day and an additional 2 mg once a day as needed for restless legs   spironolactone 25 MG tablet Commonly known as: ALDACTONE Take 12.5 mg by mouth in the morning.   torsemide 20 MG tablet Commonly known as: DEMADEX TAKE 5 TABLETS TWICE DAILY What changed: See the new instructions.   traMADol 50 MG tablet Commonly known as: ULTRAM Take 50 mg by mouth every 6 (six) hours.   traZODone 100 MG tablet Commonly known as: DESYREL Take 100 mg by mouth at bedtime.   Uptravi 200 MCG Tabs Generic drug: Selexipag Take 400 mcg by mouth 2 (two) times daily.   Uptravi 1600 MCG Tabs Generic drug: Selexipag Take 1,600 mcg by mouth 2 (two) times daily.               Durable Medical Equipment  (From admission, onward)           Start     Ordered   10/20/20 1200  For home use only DME oxygen  Once       Question Answer Comment  Length of Need 6 Months   Mode or (Route) Nasal cannula   Liters per Minute 2   Oxygen delivery system Gas      10/20/20 1200            Follow-up Information     Roetta Sessions, NP. Schedule an appointment as soon as possible for a visit in 1 week(s).   Specialty: Nurse Practitioner Contact information: [address] [place] [address] [phone]         Lorretta Harp, MD Follow up in 1 month(s).   Specialties: Cardiology, Radiology Contact  information: [address] [place] [address] [phone]         Larey Dresser, MD Follow up in 1 week(s).   Specialty: Cardiology Contact information: [address] [place] [address] [phone]  Allergies  Allergen Reactions   Pollen Extract-Tree Extract Other (See Comments)    "HEADACHES, TIRED, DRAINAGE FROM SINUSES"   Molds & Smuts Other (See Comments)    Also dust mites causes sinus infections, h/a etc.      Consultations:  GI   Other Procedures/Studies: DG Chest Portable 1 View  Result Date: 10/17/2020 CLINICAL DATA:  Congestive heart failure. EXAM: PORTABLE CHEST 1 VIEW COMPARISON:  Chest x-ray 06/02/2019, CT chest 01/15/2021 FINDINGS: Azygous fissure incidentally again noted. The heart and mediastinal contours are unchanged. Aortic calcification. Redemonstration of lingular atelectasis in the setting of a large hiatal hernia. No focal consolidation. No pulmonary edema. No pleural effusion. No pneumothorax. No acute osseous abnormality. Right shoulder rotator cuff anchor suture. IMPRESSION: 1. Large hiatal hernia. 2. No acute cardiopulmonary abnormality. Electronically Signed   By: Iven Finn M.D.   On: 10/17/2020 17:44   CUP PACEART REMOTE DEVICE CHECK  Result Date: 10/20/2020 ILR summary report received. Battery status OK. Normal device function. No new symptom, tachy, brady, or pause episodes. 833 new AF episodes. Burden 20.8%. + OAC per prior remote note. Monthly summary reports and ROV/PRN    TODAY-DAY OF DISCHARGE:  Subjective:   Hal Morales today has no headache,no chest abdominal pain,no new weakness tingling or numbness, feels much better wants to go home today.   Objective:   Blood pressure (!) 81/65, pulse 81, temperature 98 F (36.7 C), temperature source Oral, resp. rate 20, height _0  (1.702 m), weight 72.8 kg, SpO2 96 %.  Intake/Output Summary (Last 24 hours) at 10/21/2020 1514 Last  data filed at 10/21/2020 1406 Gross per 24 hour  Intake 690 ml  Output 2700 ml  Net -2010 ml   Filed Weights   10/19/20 0400 10/20/20 0552 10/21/20 0425  Weight: 82 kg 82.4 kg 72.8 kg    Exam: Awake Alert, Oriented *3, No new F.N deficits, Normal affect Major.AT,PERRAL Supple Neck,No JVD, No cervical lymphadenopathy appriciated.  Symmetrical Chest wall movement, Good air movement bilaterally, CTAB RRR,No Gallops,Rubs or new Murmurs, No Parasternal Heave +ve B.Sounds, Abd Soft, Non tender, No organomegaly appriciated, No rebound -guarding or rigidity. No Cyanosis, Clubbing or edema, No new Rash or bruise   PERTINENT RADIOLOGIC STUDIES: CUP PACEART REMOTE DEVICE CHECK  Result Date: 10/20/2020 ILR summary report received. Battery status OK. Normal device function. No new symptom, tachy, brady, or pause episodes. 833 new AF episodes. Burden 20.8%. + OAC per prior remote note. Monthly summary reports and ROV/PRN    PERTINENT LAB RESULTS: CBC: Recent Labs    10/20/20 0324 10/21/20 0255  WBC 8.7 8.1  HGB 8.2* 8.6*  HCT 28.1* 28.4*  PLT 402* 393   CMET CMP     Component Value Date/Time   NA 135 10/20/2020 0324   NA 139 12/10/2019 1007   NA 143 06/26/2015 0916   K 3.2 (L) 10/20/2020 0324   K 3.8 06/26/2015 0916   CL 92 (L) 10/20/2020 0324   CL 105 04/07/2012 0925   CO2 32 10/20/2020 0324   CO2 32 (H) 06/26/2015 0916   GLUCOSE 98 10/20/2020 0324   GLUCOSE 102 06/26/2015 0916   GLUCOSE 86 04/07/2012 0925   BUN 43 (H) 10/20/2020 0324   BUN 56 (H) 12/10/2019 1007   BUN 14.0 06/26/2015 0916   CREATININE 1.52 (H) 10/20/2020 0324   CREATININE 1.0 06/26/2015 0916   CALCIUM 8.0 (L) 10/20/2020 0324   CALCIUM 8.9 06/26/2015 0916   PROT 5.8 (L) 10/18/2020 1607  PROT 6.6 12/10/2019 1007   PROT 6.2 (L) 06/26/2015 0916   ALBUMIN 3.1 (L) 10/18/2020 0348   ALBUMIN 4.4 12/10/2019 1007   ALBUMIN 3.5 06/26/2015 0916   AST 13 (L) 10/18/2020 0348   AST 17 06/26/2015 0916   ALT 11  10/18/2020 0348   ALT 15 06/26/2015 0916   ALKPHOS 79 10/18/2020 0348   ALKPHOS 87 06/26/2015 0916   BILITOT 0.6 10/18/2020 0348   BILITOT <0.2 12/10/2019 1007   BILITOT 0.50 06/26/2015 0916   GFRNONAA 44 (L) 10/20/2020 0324   GFRAA 32 (L) 12/10/2019 1007    GFR Estimated Creatinine Clearance: 32.6 mL/min (A) (by C-G formula based on SCr of 1.52 mg/dL (H)). No results for input(s): LIPASE, AMYLASE in the last 72 hours. No results for input(s): CKTOTAL, CKMB, CKMBINDEX, TROPONINI in the last 72 hours. Invalid input(s): POCBNP No results for input(s): DDIMER in the last 72 hours. No results for input(s): HGBA1C in the last 72 hours. No results for input(s): CHOL, HDL, LDLCALC, TRIG, CHOLHDL, LDLDIRECT in the last 72 hours. No results for input(s): TSH, T4TOTAL, T3FREE, THYROIDAB in the last 72 hours.  Invalid input(s): FREET3 No results for input(s): VITAMINB12, FOLATE, FERRITIN, TIBC, IRON, RETICCTPCT in the last 72 hours. Coags: No results for input(s): INR in the last 72 hours.  Invalid input(s): PT Microbiology: Recent Results (from the past 240 hour(s))  Resp Panel by RT-PCR (Flu A&B, Covid) Nasopharyngeal Swab     Status: None   Collection Time: 10/17/20  5:13 PM   Specimen: Nasopharyngeal Swab; Nasopharyngeal(NP) swabs in vial transport medium  Result Value Ref Range Status   SARS Coronavirus 2 by RT PCR NEGATIVE NEGATIVE Final    Comment: (NOTE) SARS-CoV-2 target nucleic acids are NOT DETECTED.  The SARS-CoV-2 RNA is generally detectable in upper respiratory specimens during the acute phase of infection. The lowest concentration of SARS-CoV-2 viral copies this assay can detect is 138 copies/mL. A negative result does not preclude SARS-Cov-2 infection and should not be used as the sole basis for treatment or other patient management decisions. A negative result may occur with  improper specimen collection/handling, submission of specimen other than nasopharyngeal  swab, presence of viral mutation(s) within the areas targeted by this assay, and inadequate number of viral copies(<138 copies/mL). A negative result must be combined with clinical observations, patient history, and epidemiological information. The expected result is Negative.  Fact Sheet for Patients:  EntrepreneurPulse.com.au  Fact Sheet for Healthcare Providers:  IncredibleEmployment.be  This test is no t yet approved or cleared by the [place] FDA and  has been authorized for detection and/or diagnosis of SARS-CoV-2 by FDA under an Emergency Use Authorization (EUA). This EUA will remain  in effect (meaning this test can be used) for the duration of the COVID-19 declaration under Section 564(b)(1) of the Act, 21 U.S.C.section 360bbb-3(b)(1), unless the authorization is terminated  or revoked sooner.       Influenza A by PCR NEGATIVE NEGATIVE Final   Influenza B by PCR NEGATIVE NEGATIVE Final    Comment: (NOTE) The Xpert Xpress SARS-CoV-2/FLU/RSV plus assay is intended as an aid in the diagnosis of influenza from Nasopharyngeal swab specimens and should not be used as a sole basis for treatment. Nasal washings and aspirates are unacceptable for Xpert Xpress SARS-CoV-2/FLU/RSV testing.  Fact Sheet for Patients: EntrepreneurPulse.com.au  Fact Sheet for Healthcare Providers: IncredibleEmployment.be  This test is not yet approved or cleared by the [place] FDA and has been authorized for detection  and/or diagnosis of SARS-CoV-2 by FDA under an Emergency Use Authorization (EUA). This EUA will remain in effect (meaning this test can be used) for the duration of the COVID-19 declaration under Section 564(b)(1) of the Act, 21 U.S.C. section 360bbb-3(b)(1), unless the authorization is terminated or revoked.  Performed at [facility], [place] [address], [place], [place] [address]   MRSA Next  Gen by PCR, Nasal     Status: None   Collection Time: 10/18/20  5:22 AM   Specimen: Nasal Mucosa; Nasal Swab  Result Value Ref Range Status   MRSA by PCR Next Gen NOT DETECTED NOT DETECTED Final    Comment: (NOTE) The GeneXpert MRSA Assay (FDA approved for NASAL specimens only), is one component of a comprehensive MRSA colonization surveillance program. It is not intended to diagnose MRSA infection nor to guide or monitor treatment for MRSA infections. Test performance is not FDA approved in patients less than 57 years old. Performed at [facility], [place] [address], [place], [place] [address]     FURTHER DISCHARGE INSTRUCTIONS:  Get Medicines reviewed and adjusted: Please take all your medications with you for your next visit with your Primary MD  Laboratory/radiological data: Please request your Primary MD to go over all hospital tests and procedure/radiological results at the follow up, please ask your Primary MD to get all Hospital records sent to his/her office.  In some cases, they will be blood work, cultures and biopsy results pending at the time of your discharge. Please request that your primary care M.D. goes through all the records of your hospital data and follows up on these results.  Also Note the following: If you experience worsening of your admission symptoms, develop shortness of breath, life threatening emergency, suicidal or homicidal thoughts you must seek medical attention immediately by calling 911 or calling your MD immediately  if symptoms less severe.  You must read complete instructions/literature along with all the possible adverse reactions/side effects for all the Medicines you take and that have been prescribed to you. Take any new Medicines after you have completely understood and accpet all the possible adverse reactions/side effects.   Do not drive when taking Pain medications or sleeping medications (Benzodaizepines)  Do not take more than  prescribed Pain, Sleep and Anxiety Medications. It is not advisable to combine anxiety,sleep and pain medications without talking with your primary care practitioner  Special Instructions: If you have smoked or chewed Tobacco  in the last 2 yrs please stop smoking, stop any regular Alcohol  and or any Recreational drug use.  Wear Seat belts while driving.  Please note: You were cared for by a hospitalist during your hospital stay. Once you are discharged, your primary care physician will handle any further medical issues. Please note that NO REFILLS for any discharge medications will be authorized once you are discharged, as it is imperative that you return to your primary care physician (or establish a relationship with a primary care physician if you do not have one) for your post hospital discharge needs so that they can reassess your need for medications and monitor your lab values.  Total Time spent coordinating discharge including counseling, education and face to face time equals 35 minutes.  SignedOren Binet 10/21/2020 3:14 PM

## 2020-10-21 NOTE — Progress Notes (Signed)
Md Reece Levy) notified of patients low blood pressure. No orders given. MD stated he will order labs. Patient resting in bed asymptomatic.

## 2020-10-22 ENCOUNTER — Other Ambulatory Visit (HOSPITAL_COMMUNITY): Payer: Self-pay | Admitting: Adult Health

## 2020-10-23 ENCOUNTER — Other Ambulatory Visit: Payer: Self-pay

## 2020-10-23 ENCOUNTER — Telehealth (HOSPITAL_COMMUNITY): Payer: Self-pay | Admitting: *Deleted

## 2020-10-23 ENCOUNTER — Ambulatory Visit (HOSPITAL_COMMUNITY)
Admission: RE | Admit: 2020-10-23 | Discharge: 2020-10-23 | Disposition: A | Discharge status: Home / Self Care | Payer: Medicare HMO | Source: Ambulatory Visit | Attending: Cardiology | Admitting: Cardiology

## 2020-10-23 ENCOUNTER — Encounter (HOSPITAL_COMMUNITY): Payer: Self-pay | Admitting: Cardiology

## 2020-10-23 VITALS — BP 90/50 | HR 78 | Wt 168.2 lb

## 2020-10-23 DIAGNOSIS — I2721 Secondary pulmonary arterial hypertension: Secondary | ICD-10-CM | POA: Insufficient documentation

## 2020-10-23 DIAGNOSIS — Z7901 Long term (current) use of anticoagulants: Secondary | ICD-10-CM | POA: Diagnosis not present

## 2020-10-23 DIAGNOSIS — R5383 Other fatigue: Secondary | ICD-10-CM | POA: Diagnosis not present

## 2020-10-23 DIAGNOSIS — I480 Paroxysmal atrial fibrillation: Secondary | ICD-10-CM | POA: Insufficient documentation

## 2020-10-23 DIAGNOSIS — I130 Hypertensive heart and chronic kidney disease with heart failure and stage 1 through stage 4 chronic kidney disease, or unspecified chronic kidney disease: Secondary | ICD-10-CM | POA: Diagnosis not present

## 2020-10-23 DIAGNOSIS — Z8673 Personal history of transient ischemic attack (TIA), and cerebral infarction without residual deficits: Secondary | ICD-10-CM | POA: Insufficient documentation

## 2020-10-23 DIAGNOSIS — N179 Acute kidney failure, unspecified: Secondary | ICD-10-CM | POA: Insufficient documentation

## 2020-10-23 DIAGNOSIS — N183 Chronic kidney disease, stage 3 unspecified: Secondary | ICD-10-CM | POA: Insufficient documentation

## 2020-10-23 DIAGNOSIS — Z79899 Other long term (current) drug therapy: Secondary | ICD-10-CM | POA: Insufficient documentation

## 2020-10-23 DIAGNOSIS — I5032 Chronic diastolic (congestive) heart failure: Secondary | ICD-10-CM | POA: Insufficient documentation

## 2020-10-23 DIAGNOSIS — Z87891 Personal history of nicotine dependence: Secondary | ICD-10-CM | POA: Diagnosis not present

## 2020-10-23 DIAGNOSIS — Z9981 Dependence on supplemental oxygen: Secondary | ICD-10-CM | POA: Diagnosis not present

## 2020-10-23 DIAGNOSIS — Z8249 Family history of ischemic heart disease and other diseases of the circulatory system: Secondary | ICD-10-CM | POA: Insufficient documentation

## 2020-10-23 DIAGNOSIS — G4733 Obstructive sleep apnea (adult) (pediatric): Secondary | ICD-10-CM | POA: Diagnosis not present

## 2020-10-23 DIAGNOSIS — Z09 Encounter for follow-up examination after completed treatment for conditions other than malignant neoplasm: Secondary | ICD-10-CM | POA: Diagnosis not present

## 2020-10-23 DIAGNOSIS — R531 Weakness: Secondary | ICD-10-CM | POA: Insufficient documentation

## 2020-10-23 DIAGNOSIS — J8410 Pulmonary fibrosis, unspecified: Secondary | ICD-10-CM | POA: Diagnosis not present

## 2020-10-23 LAB — CBC
HCT: 29.5 % — ABNORMAL LOW (ref 39.0–52.0)
Hemoglobin: 9.1 g/dL — ABNORMAL LOW (ref 13.0–17.0)
MCH: 24.2 pg — ABNORMAL LOW (ref 26.0–34.0)
MCHC: 30.8 g/dL (ref 30.0–36.0)
MCV: 78.5 fL — ABNORMAL LOW (ref 80.0–100.0)
Platelets: 371 10*3/uL (ref 150–400)
RBC: 3.76 MIL/uL — ABNORMAL LOW (ref 4.22–5.81)
RDW: 19.8 % — ABNORMAL HIGH (ref 11.5–15.5)
WBC: 9.6 10*3/uL (ref 4.0–10.5)
nRBC: 0 % (ref 0.0–0.2)

## 2020-10-23 LAB — BASIC METABOLIC PANEL
Anion gap: 17 — ABNORMAL HIGH (ref 5–15)
BUN: 85 mg/dL — ABNORMAL HIGH (ref 8–23)
CO2: 28 mmol/L (ref 22–32)
Calcium: 7.5 mg/dL — ABNORMAL LOW (ref 8.9–10.3)
Chloride: 86 mmol/L — ABNORMAL LOW (ref 98–111)
Creatinine, Ser: 4.18 mg/dL — ABNORMAL HIGH (ref 0.61–1.24)
GFR, Estimated: 13 mL/min — ABNORMAL LOW (ref >60)
Glucose, Bld: 96 mg/dL (ref 70–99)
Potassium: 4 mmol/L (ref 3.5–5.1)
Sodium: 131 mmol/L — ABNORMAL LOW (ref 135–145)

## 2020-10-23 LAB — IRON AND TIBC
Iron: 41 ug/dL — ABNORMAL LOW (ref 45–182)
Saturation Ratios: 8 % — ABNORMAL LOW (ref 17.9–39.5)
TIBC: 484 ug/dL — ABNORMAL HIGH (ref 250–450)
UIBC: 443 ug/dL

## 2020-10-23 LAB — FERRITIN: Ferritin: 20 ng/mL — ABNORMAL LOW (ref 24–336)

## 2020-10-23 LAB — BRAIN NATRIURETIC PEPTIDE: B Natriuretic Peptide: 38.5 pg/mL (ref 0.0–100.0)

## 2020-10-23 MED ORDER — METOPROLOL SUCCINATE ER 25 MG PO TB24: 12.5000 mg | ORAL_TABLET | Freq: Every day | ORAL | 3 refills | Status: DC

## 2020-10-23 NOTE — Telephone Encounter (Signed)
Spoke w/pt, he is aware, agreeable, and verbalized understanding, appt sch for [facility] 9/26

## 2020-10-23 NOTE — Patient Instructions (Signed)
Start Metoprolol XL 12.5 mg (1/2 tab) Daily at Bedtime  Labs done today, your results will be available in MyChart, we will contact you for abnormal readings.  Your physician recommends that you schedule a follow-up appointment in: 3-4 weeks  Do the following things EVERYDAY: Weigh yourself in the morning before breakfast. Write it down and keep it in a log. Take your medicines as prescribed Eat low salt foods--Limit salt (sodium) to 2000 mg per day.  Stay as active as you can everyday Limit all fluids for the day to less than 2 liters  If you have any questions or concerns before your next appointment please send [place] a message through [place] or call our office at [phone].    TO LEAVE A MESSAGE FOR THE NURSE SELECT OPTION 2, PLEASE LEAVE A MESSAGE INCLUDING: YOUR NAME DATE OF BIRTH CALL BACK NUMBER REASON FOR CALL**this is important as we prioritize the call backs  YOU WILL RECEIVE A CALL BACK THE SAME DAY AS LONG AS YOU CALL BEFORE 4:00 PM  At the [facility], you and your health needs are our priority. As part of our continuing mission to provide you with exceptional heart care, we have created designated Provider Care Teams. These Care Teams include your primary Cardiologist (physician) and Advanced Practice Providers (APPs- Physician Assistants and Nurse Practitioners) who all work together to provide you with the care you need, when you need it.   You may see any of the following providers on your designated Care Team at your next follow up: Dr Glori Bickers Dr Loralie Champagne Dr Patrice Paradise, NP Lyda Jester, [place] Ginnie Smart Audry Riles, PharmD   Please be sure to bring in all your medications bottles to every appointment.

## 2020-10-23 NOTE — Telephone Encounter (Signed)
-----  Message from Larey Dresser, MD sent at 10/23/2020  4:46 PM EDT ----- Very significant creatinine rise.  Call asap and stop spironolactone, metolazone, and torsemide.  He needs to be seen in clinic on Monday with BMET.  Report to ER with any worsening of symptoms (confusion, dizziness, etc).   Set up for feraheme infusion.

## 2020-10-24 ENCOUNTER — Telehealth (HOSPITAL_COMMUNITY): Payer: Self-pay | Admitting: Licensed Clinical Social Worker

## 2020-10-24 NOTE — Progress Notes (Signed)
Carelink Summary Report / Loop Recorder

## 2020-10-24 NOTE — Telephone Encounter (Signed)
CSW consulted to help with transportation to Monday appt.  CSW called pt to help arrange through [facility] but pt states he has been able to find his own transportation so no longer needs the service.  Will plan to call us if anything changes or if he needs assistance in the future.  Will continue to follow and assist as needed  Jorge Ny, [facility] Desk#: [phone] Cell#: [phone]

## 2020-10-26 NOTE — Progress Notes (Signed)
PCP: Roetta Sessions, NP Cardiology: Dr. Aundra Dubin  87 y.o. with history of interstitial lung disease (?NSIP vs UIP) on home oxygen, pulmonary hypertension, diastolic CHF, and prior non-Hodgkins lymphoma (pelvic mass, s/p XRT) presents for followup of CHF and pulmonary hypertension.  Patient has history of reportedly mild ILD (NSIP vs UIP) followed by a pulmonologist at [facility] and pulmonary hypertension thought to be out of proportion lung disease followed by a physician at [facility].  Last CT chest was in 11/19, showing stable ILD (?UIP vs NSIP).  He had RHC in 2/20 with moderate PAH, PVR 5.1 WU.  He was tried on Adcirca but developed cognitive problems and worsening of peripheral edema so this was stopped.  He has been on home oxygen, uses at night and with exertion.    He was admitted in 6/20 with worsening dyspnea and peripheral edema, weight up despite compliance with torsemide 40 mg bid at home.  He has CKD stage 3 and torsemide was decreased in the spring. He was diuresed aggressively with fall in weight.  Echo showed normal LV size and systolic function, RV reportedly normal.  6/20 RHC showed normal filling pressures with moderate pulmonary hypertension, PVR 5.35 WU.   After discharge from hospital, patient started on ambrisentan.  His weight steadily began to increase and he developed worsening lower extremity edema, we stopped ambrisentan.   Patient was admitted in 8/20 with AKI after taking metolazone for several days.  He was admitted again in 8/20 with acute on chronic diastolic CHF. He was diuresed.  Wandering atrial pacemaker noted.   He was admitted in 3/21 with suspected CVA, MRI head showed punctate occipital lobe infarct, thought to be due to small vessel disease. ILR was implanted.  Echo in 3/21 showed EF 60-65%, severe LVH, normal RV.  PYP scan in 3/21 was not suggestive of TTR amyloidosis.  Subsequently, ILR has shown paroxysmal atrial fibrillation.   He was admitted later in  3/21 with AKI, creatinine up to 5.  However, he corrected rapidly to baseline with IVF and creatinine was 1.02 when discharge.   Echo in 6/22 showed EF 65-70% with mild LVH, normal RV, IVC normal, PASP estimated 29 mmHg.   Patient was admitted in 9/22 with upper GI bleed from bleeding gastric polyps.  He had polypectomy and has been restarted on Eliquis.   He returns for followup of CHF and pulmonary hypertension. Not using CPAP, feels like he cannot tolerate it.  Weight is down 8 lbs.  He has been weak since his GI bleed but has been starting to feel better.  Now walking more.  Uses his walker.  Mild dyspnea walking into the office today.  No chest pain.  No lightheadedness though BP is relatively low this morning.  No orthopnea/PND.  He feels like he is too fatigued from recent GI bleed to do 6 minute walk today.    ECG (personally reviewed, 9/16): NSR, with PACs  6 minute walk (8/21): 122 m 6 minute walk (11/21): 244 m 6 minute walk (3/22): 122 m  Labs (6/20): K 4.7, creatinine 1.46 Labs (7/20): K 3.1, creatinine 1.67 Labs (8/20): K 3.9, creatinine 1.8 Labs (9/20): K 4.9, creatinine 1.76, BNP 78 Labs (10/20): K 4.2, creatininine 1.5 Labs (11/20): BNP 36, K 4.3, creatinine 1.4 Labs (3/21): myeloma panel negative, urine immunofixation negative.  Labs (4/21): K 4.5, creatinine 1.02, hgb 11.4 Labs (6/21): K 5.1, creatinine 2.2, hgb 10.4 Labs (8/21): LDL 82 Labs (11/21): K 5.3, creatinine 2.11  Labs (3/22): K 3.9, creatinine 2.31 Labs (4/22): K 4.1, creatinine 2.15 Labs (9/22): hgb 8.6 => 9.1, creatinine 1.5 => 4.18, BUN 85  PMH: 1. Non-Hodgkin's lymphoma: Pelvic involvement.  S/p radiation.  Thought to be in remission (Dr. Benay Spice).  2. HTN 3. PACs/PVCs 4. GERD: Barrett's esophagus.  5. 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  - High resolution CT chest 11/19: ILD, ?UIP pattern.  6. Pulmonary hypertension: Thought to be out of proportion to underlying lung disease (ILD, ?NSIP vs UIP).    - RHC (2/20): mean RA 10, PASP 60 with mean 42, mean PCWP 11, CI 3, PVR 5.1 WU.  - Trial of Adcirca => failed due to ?memory worsening/cognitive affects and peripheral edema.  - Echo (6/20): EF 60-65%, mild LVH, RV reportedly normal, PASP 59 mmHg.  - RHC (6/20): mean RA 7, PA 50/24 mean 34, mean PCWP 12, CI 2.01, PAPI 3.7, PVR 5.35 WU - RF negative, ANA negative, anti-SCL70 negative, anti-centromere negative.  - V/Q scan (8/20): No evidence for chronic PE.  - Echo (3/21): EF 60-65%, severe LVH, normal RV.  - Echo (6/22): EF 65-70% with mild LVH, normal RV, IVC normal, PASP estimated 29 mmHg. 7. CKD: Stage 3.   8. Wandering atrial pacemaker 9. OSA 10. PYP scan (3/21): H/CL ratio 1.0, grade 0.  11. CVA: MRI 3/21 with punctate occipital lobe infarct thought to be due to small vessel disease. - Carotid dopplers (3/21): 40-59% RICA stenosis.  - Carotid dopplers (4/22): 40-59% RICA stenosis.  12. Atrial fibrillation: Paroxysmal, noted on ILR.  13. GI bleeding 9/22 from gastric polyps.   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[employer], [employer]  Tobacco Use   Smoking status: Former    Packs/day: 1.00    Years: 35.00    Pack years: 35.00    Types: Pipe, Cigarettes    Quit date: 02/23/1992    Years since quitting: 28.6   Smokeless tobacco: Never   Tobacco comments:    quit 20 years ago  Vaping Use   Vaping Use: Never used  Substance and Sexual Activity   Alcohol use: Not Currently    Comment: 2 drinks daily scotch  AND WINE WITH SUPPER   Drug use: No   Sexual activity: Not on file  Other Topics Concern   Not on file  Social History Narrative   Married - second marriage   He has two children   Retired Horticulturist, commercial Professor   Currently teaches pottery  making, 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[facility]   Former Smoker quit 12 years ago- smoked for 35 years   Alcohol use- not currently   Right handed   Social Determinants of Radio broadcast assistant Strain: Not on file  Food Insecurity: Not on file  Transportation Needs: Not on file  Physical Activity: Not on file  Stress: Not on file  Social Connections: Not on file  Intimate Partner Violence: Not on file   Family History  Problem Relation Age of Onset   Heart disease Father        MI 53   Urticaria Father    Prostate cancer Brother    Prostate cancer Paternal Uncle    Prostate cancer Paternal Uncle  Prostate cancer Paternal Uncle    Hyperlipidemia Other    Stroke Other    Hypertension Other    ADD / ADHD Other    Colon cancer Neg Hx    Allergic rhinitis Neg Hx    Asthma Neg Hx    Eczema Neg Hx    ROS: All systems reviewed and negative except as per HPI.   Current Outpatient Medications  Medication Sig Dispense Refill   acetaminophen (TYLENOL) 500 MG tablet Take 500 mg by mouth every 6 (six) hours as needed for mild pain or headache.     apixaban (ELIQUIS) 2.5 MG TABS tablet Take 1 tablet (2.5 mg total) by mouth 2 (two) times daily. 60 tablet 11   atorvastatin (LIPITOR) 40 MG tablet Take 1 tablet (40 mg total) by mouth daily at 6 PM. 30 tablet 1   cyanocobalamin (,VITAMIN B-12,) 1000 MCG/ML injection Inject 1,000 mcg into the muscle every 30 (thirty) days.     escitalopram (LEXAPRO) 10 MG tablet Take 10 mg by mouth daily.     FEROSUL 325 (65 Fe) MG tablet Take 325 mg by mouth 2 (two) times daily with a meal.     finasteride (PROSCAR) 5 MG tablet Take 5 mg by mouth daily.     FLONASE SENSIMIST 27.5 MCG/SPRAY nasal spray Place 1 spray into the nose daily as needed for rhinitis or allergies.      Melatonin 10 MG TABS Take 10 mg by mouth at bedtime.     metoprolol succinate (TOPROL XL) 25 MG 24 hr tablet Take 0.5 tablets (12.5 mg total) by mouth at bedtime. 15 tablet 3   NON  FORMULARY Take 1 tablet by mouth See admin instructions. Unnamed laxative: Take 1 tablet by mouth at bedtime as needed for iron-induced constipation     pantoprazole (PROTONIX) 40 MG tablet Take 1 tablet (40 mg total) by mouth 2 (two) times daily. Take twice daily for 8 weeks and then change in dosing to once daily. 120 tablet 1   Riociguat (ADEMPAS) 2.5 MG TABS Take 2.5 mg by mouth 3 (three) times daily. 90 tablet 11   rOPINIRole (REQUIP) 2 MG tablet Take 2 mg by mouth See admin instructions. Take 2 mg by mouth three times a day and an additional 2 mg once a day as needed for restless legs     traMADol (ULTRAM) 50 MG tablet Take 50 mg by mouth every 6 (six) hours.     traZODone (DESYREL) 100 MG tablet Take 100 mg by mouth at bedtime.     UPTRAVI 1600 MCG TABS Take 1,600 mcg by mouth 2 (two) times daily.     UPTRAVI 200 MCG TABS Take 400 mcg by mouth 2 (two) times daily.     No current facility-administered medications for this encounter.   BP (!) 90/50   Pulse 78   Wt 76.3 kg (168 lb 3.2 oz)   SpO2 94%   BMI 26.34 kg/m  General: NAD Neck: No JVD, no thyromegaly or thyroid nodule.  Lungs: Clear to auscultation bilaterally with normal respiratory effort. CV: Nondisplaced PMI.  Heart regular S1/S2, no S3/S4, no murmur.  No peripheral edema.  No carotid bruit.  Normal pedal pulses.  Abdomen: Soft, nontender, no hepatosplenomegaly, no distention.  Skin: Intact without lesions or rashes.  Neurologic: Alert and oriented x 3.  Psych: Normal affect. Extremities: No clubbing or cyanosis.  HEENT: Normal.   Assessment/Plan: 1. Chronic diastolic CHF: Suspect with significant component of RV failure from  pulmonary hypertension.  Echo in 6/20 with EF 60-65%, RV not well-visualized but PASP 59 mmHg.  Echo (3/21) with EF 60-65%, severe LVH, normal RV, PASP 28 mmHg.  PYP scan was not suggestive of TTR cardiac amyloidosis. Echoin 6/22 showed EF 65-70% with mild LVH, normal RV, IVC normal, PASP estimated  29 mmHg.  NYHA class III symptoms.  He does not look volume overloaded and creatinine is significantly up on today's labs.  This may be due to recent GI bleeding and ongoing diuretic use though hgb is higher today.   - Stop torsemide and metolazone.  He will need close followup in clinic in 3 days (Monday) with BMET.     - Stop spironolactone.  2. AKI on CKD Stage 3: As above, stopping torsemide, metolazone, and spironolactone with followup next week.  3. Pulmonary hypertension: Patient thought to have [place] out of proportion to his lung disease (ILD, NSIP versus UIP). RHC in 2/20 showed mean PA pressure 41, PVR 5.1 WU, CI 3. Echo in 6/20 with RV reportedly ok => I reviewed and do not think that RV was well-visualized.  Patient did not tolerate Adcirca due to edema/?cognitive effects.  RHC was done again in 6/20, showed moderate pulmonary arterial hypertension with PVR 5.35 (comparable to prior).  Serological workup for PH was negative. Patient started ambrisentan 5 mg and developed significant edema/volume overload despite a high dose of torsemide. He is now on Uptravi + riociguat.  Most recent echo in 6/22 showed normal RV size/systolic function and PA systolic pressure estimate was normal.   - 6 minute walk next appt, hold off with fatigue today in setting of recent GI bleeding and AKI.  - he will stay off ambrisentan (?trigger for volume overload).  - As above, he did not tolerate tadalafil.  - He is at 2000 mcg bid Uptravi.  With excellent echo in 6/22, will keep at this dose.   - Continue riociguat at goal dose 2.5 mg tid.  4. Atrial fibrillation: Paroxysmal, noted on ILR. NSR today.  - Continue apixaban 2.5 mg bid (dosed for age, renal dysfunction).  5. OSA: Cannot tolerate CPAP.  6. CVA: In 3/21, may have been atrial fibrillation-related.  On apixaban now.  7. Carotid stenosis: 40-59% RICA stenosis on 4/22 dopplers.  - Repeat 4/23.    8. Interstitial lung disease:  Has seen pulmonary, ILD seems  mild and stable.   Followup in 3 days with repeat BMET.   Loralie Champagne 10/26/2020

## 2020-10-27 ENCOUNTER — Other Ambulatory Visit: Payer: Self-pay

## 2020-10-27 ENCOUNTER — Ambulatory Visit (HOSPITAL_COMMUNITY)
Admission: RE | Admit: 2020-10-27 | Discharge: 2020-10-27 | Disposition: A | Discharge status: Home / Self Care | Payer: Medicare HMO | Source: Ambulatory Visit | Attending: Cardiology | Admitting: Cardiology

## 2020-10-27 ENCOUNTER — Encounter (HOSPITAL_COMMUNITY): Payer: Self-pay | Admitting: Cardiology

## 2020-10-27 VITALS — BP 110/70 | HR 73 | Wt 172.2 lb

## 2020-10-27 DIAGNOSIS — Z8673 Personal history of transient ischemic attack (TIA), and cerebral infarction without residual deficits: Secondary | ICD-10-CM | POA: Diagnosis not present

## 2020-10-27 DIAGNOSIS — Z87891 Personal history of nicotine dependence: Secondary | ICD-10-CM | POA: Insufficient documentation

## 2020-10-27 DIAGNOSIS — N179 Acute kidney failure, unspecified: Secondary | ICD-10-CM | POA: Insufficient documentation

## 2020-10-27 DIAGNOSIS — I5032 Chronic diastolic (congestive) heart failure: Secondary | ICD-10-CM | POA: Insufficient documentation

## 2020-10-27 DIAGNOSIS — I130 Hypertensive heart and chronic kidney disease with heart failure and stage 1 through stage 4 chronic kidney disease, or unspecified chronic kidney disease: Secondary | ICD-10-CM | POA: Insufficient documentation

## 2020-10-27 DIAGNOSIS — G4733 Obstructive sleep apnea (adult) (pediatric): Secondary | ICD-10-CM | POA: Insufficient documentation

## 2020-10-27 DIAGNOSIS — Z79899 Other long term (current) drug therapy: Secondary | ICD-10-CM | POA: Diagnosis not present

## 2020-10-27 DIAGNOSIS — Z8249 Family history of ischemic heart disease and other diseases of the circulatory system: Secondary | ICD-10-CM | POA: Insufficient documentation

## 2020-10-27 DIAGNOSIS — I480 Paroxysmal atrial fibrillation: Secondary | ICD-10-CM | POA: Insufficient documentation

## 2020-10-27 DIAGNOSIS — I2721 Secondary pulmonary arterial hypertension: Secondary | ICD-10-CM | POA: Diagnosis not present

## 2020-10-27 DIAGNOSIS — Z9981 Dependence on supplemental oxygen: Secondary | ICD-10-CM | POA: Insufficient documentation

## 2020-10-27 DIAGNOSIS — J8410 Pulmonary fibrosis, unspecified: Secondary | ICD-10-CM | POA: Diagnosis not present

## 2020-10-27 DIAGNOSIS — Z09 Encounter for follow-up examination after completed treatment for conditions other than malignant neoplasm: Secondary | ICD-10-CM | POA: Diagnosis not present

## 2020-10-27 DIAGNOSIS — Z7901 Long term (current) use of anticoagulants: Secondary | ICD-10-CM | POA: Diagnosis not present

## 2020-10-27 DIAGNOSIS — N183 Chronic kidney disease, stage 3 unspecified: Secondary | ICD-10-CM | POA: Insufficient documentation

## 2020-10-27 LAB — BASIC METABOLIC PANEL
Anion gap: 12 (ref 5–15)
BUN: 120 mg/dL — ABNORMAL HIGH (ref 8–23)
CO2: 30 mmol/L (ref 22–32)
Calcium: 7.5 mg/dL — ABNORMAL LOW (ref 8.9–10.3)
Chloride: 92 mmol/L — ABNORMAL LOW (ref 98–111)
Creatinine, Ser: 2.5 mg/dL — ABNORMAL HIGH (ref 0.61–1.24)
GFR, Estimated: 24 mL/min — ABNORMAL LOW (ref >60)
Glucose, Bld: 92 mg/dL (ref 70–99)
Potassium: 3.9 mmol/L (ref 3.5–5.1)
Sodium: 134 mmol/L — ABNORMAL LOW (ref 135–145)

## 2020-10-27 MED ORDER — TORSEMIDE 100 MG PO TABS: 100.0000 mg | ORAL_TABLET | Freq: Every day | ORAL | 3 refills | Status: DC

## 2020-10-27 NOTE — Progress Notes (Signed)
PCP: Roetta Sessions, NP Cardiology: Dr. Aundra Dubin  87 y.o. with history of interstitial lung disease (?NSIP vs UIP) on home oxygen, pulmonary hypertension, diastolic CHF, and prior non-Hodgkins lymphoma (pelvic mass, s/p XRT) presents for followup of CHF and pulmonary hypertension.  Patient has history of reportedly mild ILD (NSIP vs UIP) followed by a pulmonologist at [facility] and pulmonary hypertension thought to be out of proportion lung disease followed by a physician at [facility].  Last CT chest was in 11/19, showing stable ILD (?UIP vs NSIP).  He had RHC in 2/20 with moderate PAH, PVR 5.1 WU.  He was tried on Adcirca but developed cognitive problems and worsening of peripheral edema so this was stopped.  He has been on home oxygen, uses at night and with exertion.    He was admitted in 6/20 with worsening dyspnea and peripheral edema, weight up despite compliance with torsemide 40 mg bid at home.  He has CKD stage 3 and torsemide was decreased in the spring. He was diuresed aggressively with fall in weight.  Echo showed normal LV size and systolic function, RV reportedly normal.  6/20 RHC showed normal filling pressures with moderate pulmonary hypertension, PVR 5.35 WU.   After discharge from hospital, patient started on ambrisentan.  His weight steadily began to increase and he developed worsening lower extremity edema, we stopped ambrisentan.   Patient was admitted in 8/20 with AKI after taking metolazone for several days.  He was admitted again in 8/20 with acute on chronic diastolic CHF. He was diuresed.  Wandering atrial pacemaker noted.   He was admitted in 3/21 with suspected CVA, MRI head showed punctate occipital lobe infarct, thought to be due to small vessel disease. ILR was implanted.  Echo in 3/21 showed EF 60-65%, severe LVH, normal RV.  PYP scan in 3/21 was not suggestive of TTR amyloidosis.  Subsequently, ILR has shown paroxysmal atrial fibrillation.   He was admitted later in  3/21 with AKI, creatinine up to 5.  However, he corrected rapidly to baseline with IVF and creatinine was 1.02 when discharge.   Echo in 6/22 showed EF 65-70% with mild LVH, normal RV, IVC normal, PASP estimated 29 mmHg.   Patient was admitted in 9/22 with upper GI bleed from bleeding gastric polyps.  He had polypectomy and has been restarted on Eliquis.   He was seen in the office last week, significant fatigue and BMET showed creatinine up to 4.18.  He was also noted to be Fe deficient.  Torsemide, metolazone, and spironolactone were stopped.   He returns for followup of CHF and pulmonary hypertension. He does feel better now/stronger. He walked in without problem with his walker.  Not short of breath walking 100-200 feet on flat ground.  Less fatigued.  No orthopnea/PND.  Wearing oxygen at night and with exertion.     6 minute walk (8/21): 122 m 6 minute walk (11/21): 244 m 6 minute walk (3/22): 122 m  Labs (6/20): K 4.7, creatinine 1.46 Labs (7/20): K 3.1, creatinine 1.67 Labs (8/20): K 3.9, creatinine 1.8 Labs (9/20): K 4.9, creatinine 1.76, BNP 78 Labs (10/20): K 4.2, creatininine 1.5 Labs (11/20): BNP 36, K 4.3, creatinine 1.4 Labs (3/21): myeloma panel negative, urine immunofixation negative.  Labs (4/21): K 4.5, creatinine 1.02, hgb 11.4 Labs (6/21): K 5.1, creatinine 2.2, hgb 10.4 Labs (8/21): LDL 82 Labs (11/21): K 5.3, creatinine 2.11 Labs (3/22): K 3.9, creatinine 2.31 Labs (4/22): K 4.1, creatinine 2.15 Labs (9/22): hgb 8.6 =>  9.1, creatinine 1.5 => 4.18, BUN 85, BNP 38.5  PMH: 1. Non-Hodgkin's lymphoma: Pelvic involvement.  S/p radiation.  Thought to be in remission (Dr. Benay Spice).  2. HTN 3. PACs/PVCs 4. GERD: Barrett's esophagus.  5. 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  - High resolution CT chest 11/19: ILD, ?UIP pattern.  6. Pulmonary hypertension: Thought to be out of proportion to underlying lung disease (ILD, ?NSIP vs UIP).   - RHC (2/20): mean RA 10, PASP 60  with mean 42, mean PCWP 11, CI 3, PVR 5.1 WU.  - Trial of Adcirca => failed due to ?memory worsening/cognitive affects and peripheral edema.  - Echo (6/20): EF 60-65%, mild LVH, RV reportedly normal, PASP 59 mmHg.  - RHC (6/20): mean RA 7, PA 50/24 mean 34, mean PCWP 12, CI 2.01, PAPI 3.7, PVR 5.35 WU - RF negative, ANA negative, anti-SCL70 negative, anti-centromere negative.  - V/Q scan (8/20): No evidence for chronic PE.  - Echo (3/21): EF 60-65%, severe LVH, normal RV.  - Echo (6/22): EF 65-70% with mild LVH, normal RV, IVC normal, PASP estimated 29 mmHg. 7. CKD: Stage 3.   8. Wandering atrial pacemaker 9. OSA 10. PYP scan (3/21): H/CL ratio 1.0, grade 0.  11. CVA: MRI 3/21 with punctate occipital lobe infarct thought to be due to small vessel disease. - Carotid dopplers (3/21): 40-59% RICA stenosis.  - Carotid dopplers (4/22): 40-59% RICA stenosis.  12. Atrial fibrillation: Paroxysmal, noted on ILR.  13. GI bleeding 9/22 from gastric polyps.   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[place], shop  Tobacco Use   Smoking status: Former    Packs/day: 1.00    Years: 35.00    Pack years: 35.00    Types: Pipe, Cigarettes    Quit date: 02/23/1992    Years since quitting: 28.6   Smokeless tobacco: Never   Tobacco comments:    quit 20 years ago  Vaping Use   Vaping Use: Never used  Substance and Sexual Activity   Alcohol use: Not Currently    Comment: 2 drinks daily scotch  AND WINE WITH SUPPER   Drug use: No   Sexual activity: Not on file  Other Topics Concern   Not on file  Social History Narrative   Married - second marriage   He has two children   Retired Horticulturist, commercial Professor   Currently teaches pottery making, 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[facility]    Former Smoker quit 12 years ago- smoked for 35 years   Alcohol use- not currently   Right handed   Social Determinants of Radio broadcast assistant Strain: Not on file  Food Insecurity: Not on file  Transportation Needs: Not on file  Physical Activity: Not on file  Stress: Not on file  Social Connections: Not on file  Intimate Partner Violence: Not on file   Family History  Problem Relation Age of Onset   Heart disease Father        MI 45   Urticaria Father    Prostate cancer Brother    Prostate cancer Paternal Uncle    Prostate cancer Paternal Uncle    Prostate cancer Paternal Uncle    Hyperlipidemia Other  Stroke Other    Hypertension Other    ADD / ADHD Other    Colon cancer Neg Hx    Allergic rhinitis Neg Hx    Asthma Neg Hx    Eczema Neg Hx    ROS: All systems reviewed and negative except as per HPI.   Current Outpatient Medications  Medication Sig Dispense Refill   acetaminophen (TYLENOL) 500 MG tablet Take 500 mg by mouth every 6 (six) hours as needed for mild pain or headache.     apixaban (ELIQUIS) 2.5 MG TABS tablet Take 1 tablet (2.5 mg total) by mouth 2 (two) times daily. 60 tablet 11   atorvastatin (LIPITOR) 40 MG tablet Take 1 tablet (40 mg total) by mouth daily at 6 PM. 30 tablet 1   cyanocobalamin (,VITAMIN B-12,) 1000 MCG/ML injection Inject 1,000 mcg into the muscle every 30 (thirty) days.     escitalopram (LEXAPRO) 10 MG tablet Take 10 mg by mouth daily.     FEROSUL 325 (65 Fe) MG tablet Take 325 mg by mouth 2 (two) times daily with a meal.     finasteride (PROSCAR) 5 MG tablet Take 5 mg by mouth daily.     FLONASE SENSIMIST 27.5 MCG/SPRAY nasal spray Place 1 spray into the nose daily as needed for rhinitis or allergies.      Melatonin 10 MG TABS Take 10 mg by mouth at bedtime.     metoprolol succinate (TOPROL XL) 25 MG 24 hr tablet Take 0.5 tablets (12.5 mg total) by mouth at bedtime. 15 tablet 3   NON FORMULARY Take 1 tablet by mouth See admin  instructions. Unnamed laxative: Take 1 tablet by mouth at bedtime as needed for iron-induced constipation     pantoprazole (PROTONIX) 40 MG tablet Take 1 tablet (40 mg total) by mouth 2 (two) times daily. Take twice daily for 8 weeks and then change in dosing to once daily. 120 tablet 1   Riociguat (ADEMPAS) 2.5 MG TABS Take 2.5 mg by mouth 3 (three) times daily. 90 tablet 11   rOPINIRole (REQUIP) 2 MG tablet Take 2 mg by mouth See admin instructions. Take 2 mg by mouth three times a day and an additional 2 mg once a day as needed for restless legs     torsemide (DEMADEX) 100 MG tablet Take 1 tablet (100 mg total) by mouth daily. 90 tablet 3   traMADol (ULTRAM) 50 MG tablet Take 50 mg by mouth every 6 (six) hours.     traZODone (DESYREL) 100 MG tablet Take 100 mg by mouth at bedtime.     UPTRAVI 1600 MCG TABS Take 1,600 mcg by mouth 2 (two) times daily.     UPTRAVI 200 MCG TABS Take 400 mcg by mouth 2 (two) times daily.     torsemide (DEMADEX) 100 MG tablet Take 1 tablet (100 mg total) by mouth daily. 90 tablet 6   No current facility-administered medications for this encounter.   BP 110/70   Pulse 73   Wt 78.1 kg (172 lb 3.2 oz)   SpO2 95%   BMI 26.97 kg/m  General: NAD Neck: No JVD, no thyromegaly or thyroid nodule.  Lungs: Mild crackles at bases.  CV: Nondisplaced PMI.  Heart regular S1/S2, no S3/S4, 2/6 SEM RUSB.  2+ ankle edema.  No carotid bruit.  Normal pedal pulses.  Abdomen: Soft, nontender, no hepatosplenomegaly, no distention.  Skin: Intact without lesions or rashes.  Neurologic: Alert and oriented x 3.  Psych: Normal affect.  Extremities: No clubbing or cyanosis.  HEENT: Normal.   Assessment/Plan: 1. Chronic diastolic CHF: Suspect with significant component of RV failure from pulmonary hypertension.  Echo in 6/20 with EF 60-65%, RV not well-visualized but PASP 59 mmHg.  Echo (3/21) with EF 60-65%, severe LVH, normal RV, PASP 28 mmHg.  PYP scan was not suggestive of TTR  cardiac amyloidosis. Echo in 6/22 showed EF 65-70% with mild LVH, normal RV, IVC normal, PASP estimated 29 mmHg.  NYHA class III symptoms.  He does not look significantly volume overloaded and creatinine is significantly up Friday's labs.  This may be due to recent GI bleeding and ongoing diuretic use.  - Stay off torsemide and metolazone for now.  Restart torsemide 100 mg daily on Wednesday.  BMET 1 week.     - Stay off spironolactone.  2. AKI on CKD Stage 3: As above, held torsemide, metolazone, and spironolactone.  - BMET today.  - Restart torsemide 100 mg daily Wednesday.   - BMET 1 week.  3. Pulmonary hypertension: Patient thought to have [place] out of proportion to his lung disease (ILD, NSIP versus UIP). RHC in 2/20 showed mean PA pressure 41, PVR 5.1 WU, CI 3. Echo in 6/20 with RV reportedly ok => I reviewed and do not think that RV was well-visualized.  Patient did not tolerate Adcirca due to edema/?cognitive effects.  RHC was done again in 6/20, showed moderate pulmonary arterial hypertension with PVR 5.35 (comparable to prior).  Serological workup for PH was negative. Patient started ambrisentan 5 mg and developed significant edema/volume overload despite a high dose of torsemide. He is now on Uptravi + riociguat.  Most recent echo in 6/22 showed normal RV size/systolic function and PA systolic pressure estimate was normal.   - 6 minute walk next appt, hold off with fatigue today in setting of recent GI bleeding and AKI.  - he will stay off ambrisentan (?trigger for volume overload).  - As above, he did not tolerate tadalafil.  - He is at 2000 mcg bid Uptravi.  With excellent echo in 6/22, will keep at this dose.   - Continue riociguat at goal dose 2.5 mg tid.  4. Atrial fibrillation: Paroxysmal, noted on ILR. NSR today.  - Continue apixaban 2.5 mg bid (dosed for age, renal dysfunction).  5. OSA: Cannot tolerate CPAP.  6. CVA: In 3/21, may have been atrial fibrillation-related.  On apixaban  now.  7. Carotid stenosis: 40-59% RICA stenosis on 4/22 dopplers.  - Repeat 4/23.    8. Interstitial lung disease:  Has seen pulmonary, ILD seems mild and stable.  9. Fe deficiency: After recent GI bleeding.  - Arrange for feraheme infusion.   Followup in 1 week with APP with BMET.   Loralie Champagne 10/27/2020

## 2020-10-27 NOTE — Patient Instructions (Addendum)
Labs done today. We will contact you only if your labs are abnormal.  ON Wednesday START TORSEMIDE 100MG (1 TABLET) BY MOUTH DAILY.   No other medication changes were made. Please continue all current medications as prescribed.  Your physician recommends that you schedule a follow-up appointment in: 1 week with our APP Clinic here in our office  If you have any questions or concerns before your next appointment please send [place] a message through [place] or call our office at [phone].    TO LEAVE A MESSAGE FOR THE NURSE SELECT OPTION 2, PLEASE LEAVE A MESSAGE INCLUDING: YOUR NAME DATE OF BIRTH CALL BACK NUMBER REASON FOR CALL**this is important as we prioritize the call backs  YOU WILL RECEIVE A CALL BACK THE SAME DAY AS LONG AS YOU CALL BEFORE 4:00 PM   Do the following things EVERYDAY: Weigh yourself in the morning before breakfast. Write it down and keep it in a log. Take your medicines as prescribed Eat low salt foods--Limit salt (sodium) to 2000 mg per day.  Stay as active as you can everyday Limit all fluids for the day to less than 2 liters   At the [facility], you and your health needs are our priority. As part of our continuing mission to provide you with exceptional heart care, we have created designated Provider Care Teams. These Care Teams include your primary Cardiologist (physician) and Advanced Practice Providers (APPs- Physician Assistants and Nurse Practitioners) who all work together to provide you with the care you need, when you need it.   You may see any of the following providers on your designated Care Team at your next follow up: Dr Glori Bickers Dr Haynes Kerns, NP Lyda Jester, [place] Audry Riles, PharmD   Please be sure to bring in all your medications bottles to every appointment.

## 2020-10-28 ENCOUNTER — Other Ambulatory Visit (HOSPITAL_COMMUNITY): Payer: Self-pay | Admitting: *Deleted

## 2020-10-28 ENCOUNTER — Telehealth: Payer: Self-pay

## 2020-10-28 DIAGNOSIS — D509 Iron deficiency anemia, unspecified: Secondary | ICD-10-CM

## 2020-10-28 DIAGNOSIS — N179 Acute kidney failure, unspecified: Secondary | ICD-10-CM

## 2020-10-28 DIAGNOSIS — I5032 Chronic diastolic (congestive) heart failure: Secondary | ICD-10-CM

## 2020-10-28 NOTE — Progress Notes (Signed)
Spoke with patient's wife about BMET results from yesterday. Per Dr Aundra Dubin needs repeat BMET on Friday. All questions answered re: lab results.   Per Dr Aundra Dubin pt needs IV fereheme per lab results from yesterday. Discussed with Audry Riles PharmD; pt needs 3 doses. Scheduled for 1st infusion 11/03/20 at 12:00. They will schedule follow up doses at each appt. Pt's wife made aware of appt and where to go. She verbalized understanding of all the above.  Pt will arrange transportation from [place] with their transportation service. If they are unable to provide transportation instructed to call HF clinic so we can establish him with [facility]. She verbalized understanding.   Emerson Monte RN [place] Coordinator  Office: [phone]  24/7 Pager: [phone]

## 2020-10-28 NOTE — Telephone Encounter (Signed)
   Ryan [name] DOB: 1931-03-11 MRN: [id number]   RIDER WAIVER AND RELEASE OF LIABILITY  For purposes of improving physical access to our facilities, Ryan [name] is pleased to partner with third parties to provide Ryan [name] patients or other authorized individuals the option of convenient, on-demand ground transportation services (the Technical brewer") through use of the technology service that enables users to request on-demand ground transportation from independent third-party providers.  By opting to use and accept these [employer], I, the undersigned, hereby agree on behalf of myself, and on behalf of any minor child using the Government social research officer for whom I am the parent or legal guardian, as follows:  Government social research officer provided to me are provided by independent third-party transportation providers who are not [employer] or employees and who are unaffiliated with [employer]. Ryan [name] is neither a transportation carrier nor a common or public carrier. Ryan [name] has no control over the quality or safety of the transportation that occurs as a result of the [employer]. Ryan [name] cannot guarantee that any third-party transportation provider will complete any arranged transportation service. Ryan [name] makes no representation, warranty, or guarantee regarding the reliability, timeliness, quality, safety, suitability, or availability of any of the Transport Services or that they will be error free. I fully understand that traveling by vehicle involves risks and dangers of serious bodily injury, including permanent disability, paralysis, and death. I agree, on behalf of myself and on behalf of any minor child using the Transport Services for whom I am the parent or legal guardian, that the entire risk arising out of my use of the [employer] remains solely with me, to the maximum extent permitted under applicable law. The [employer] are provided "as  is" and "as available." Ryan [name] disclaims all representations and warranties, express, implied or statutory, not expressly set out in these terms, including the implied warranties of merchantability and fitness for a particular purpose. I hereby waive and release Ryan [name], its agents, employees, officers, directors, representatives, insurers, attorneys, assigns, successors, subsidiaries, and affiliates from any and all past, present, or future claims, demands, liabilities, actions, causes of action, or suits of any kind directly or indirectly arising from acceptance and use of the [employer]. I further waive and release Ryan [name] and its affiliates from all present and future liability and responsibility for any injury or death to persons or damages to property caused by or related to the use of the [employer]. I have read this Waiver and Release of Liability, and I understand the terms used in it and their legal significance. This Waiver is freely and voluntarily given with the understanding that my right (as well as the right of any minor child for whom I am the parent or legal guardian using the [employer]) to legal recourse against  in connection with the [employer] is knowingly surrendered in return for use of these services.   I attest that I read the consent document to Ryan [name], gave Ryan [name] the opportunity to ask questions and answered the questions asked (if any). I affirm that Ryan [name] then provided consent for he's participation in this program.     Ryan [name]

## 2020-10-30 ENCOUNTER — Encounter: Payer: Self-pay | Admitting: Adult Health

## 2020-10-30 ENCOUNTER — Ambulatory Visit: Payer: Medicare HMO | Admitting: Adult Health

## 2020-10-30 VITALS — BP 117/67 | HR 93 | Ht 67.0 in | Wt 175.2 lb

## 2020-10-30 DIAGNOSIS — G2581 Restless legs syndrome: Secondary | ICD-10-CM

## 2020-10-30 DIAGNOSIS — E611 Iron deficiency: Secondary | ICD-10-CM | POA: Diagnosis not present

## 2020-10-30 NOTE — Patient Instructions (Signed)
Your Plan:  Continue requip 2 mg three times a day. Call if you need a sooner refill If your symptoms worsen or you develop new symptoms please let us know.     Thank you for coming to see [place] at [facility]. I hope we have been able to provide you high quality care today.  You may receive a patient satisfaction survey over the next few weeks. We would appreciate your feedback and comments so that we may continue to improve ourselves and the health of our patients.

## 2020-10-30 NOTE — Progress Notes (Signed)
PATIENT: Ryan [name] DOB: 05/22/1931  REASON FOR VISIT: follow up HISTORY FROM: patient  HISTORY OF PRESENT ILLNESS: 10/30/20:  Ryan [name] is an 87 year old male with a history of restless leg syndrome.  He returns today for follow-up.  The patient states that Requip 2 mg 3 times a day is working well for him.  He tries to space his dosing out by 7 hours.  States that if he tries to go longer he has breakthrough symptoms.  Typically will have an attack between 6 PM and midnight.  This is typically when he takes his third tablet.  He reports on occasion he may have to take a 4th tablet for breakthrough pain but this is not common.  The patient was recently in the hospital for anemia.  He received blood transfusion while there.  He will receive an iron transfusion next week.  He returns today for an evaluation.  06/25/20: Ryan [name] is an 87 year old male with a history of restless leg syndrome and chronic back pain.  He returns today for follow-up.  He is currently on Neupro 3 mg patch.  He was originally told to only take Requip 2 mg twice a day for breakthrough pain.  The patient was taking Requip 2 mg 3 times a day in addition to the Neupro patch.  Patient was advised to a telephone conversation that this was too much medication.  His dose of Requip was reduced to 0.5 mg twice a day.  He remains on Neupro 3 mg patch.  The patient does not feel that Neupro has offered him much benefit.  He would like to go back to Requip.  The patient's iron level and ferritin level was low in April.  Lab work was completed by Dr. Claris Gladden office.  According to notes the patient was placed to have an iron infusion however the patient states that he was not aware of this and has been taking oral iron supplements.  Patient has a history of chronic back pain with previous surgery through Dr. Clarice Pole office.  He also reports that he was receiving injections with Dr. Ellene Route but did not find them beneficial.   He has not followed up with Dr. Ellene Route.  He reports that he has approximately 2 restless leg attacks during the day or night.  He states that his legs feel jumpy and painful.  He also reports that during this time he notices some back pain.  Usually a hot shower offers him some benefit.  Reports that he is not sleeping well at night.  Returns today for an evaluation.  04/14/20: Ryan [name] is an 87 year old male with a history of restless leg syndrome and chronic back pain.  He returns today for follow-up.  He reports that he has not been taking the Neupro consistently.  Reports early on he would only place the patch on if he felt that he needed it.  He states for the last week he has been placing it consistently.  He takes Requip 2 mg up to 3 times a day.  He states that typically between 10 and 11 PM his symptoms will begin.  He describes it as a pins and needle sensation in the legs.  He states the hot shower used to help but it no longer does.  He states that the sensation is usually stop by 1 AM.  In the past he has been on gabapentin but was unable to tolerate it.  Patient is currently being monitored for  his kidney function.  12/10/19: Ryan [name] is an 87 year old male with a history of restless leg syndrome and chronic back pain.  He returns today for follow-up.  The patient has been taking Requip and gabapentin but reports it does not offer him much benefit for his restless legs.  He states he can be up 3-4 times at night.  He also has symptoms throughout the day.  He states that gabapentin makes him very drowsy.  He has not taken it for the last 2 nights.  His B12 levels were low back in April.  He has been taking monthly B12 injections but has not had a shot in at least 2 months.  He returns today for an evaluation.   HISTORY 10/18/19:   Ryan [name] is an 87 year old male with a history of restless leg syndrome, CVA and chronic back pain.  He returns today to discuss restless legs.  He states  that he is currently taking Requip 2 mg 3 times a day.  He states that he tries to take them 8 hours apart.  He states that he has been waking up during the night with symptoms.  He states that this can occur anytime between 10 and 12 and sometimes will last till 4 in the morning.  He states that a hot shower is sometimes beneficial.  Initially tramadol offered him the most benefit but it no longer works.  He states that he does not have any symptoms during the day.  He returns today for an evaluation.  REVIEW OF SYSTEMS: Out of a complete 14 system review of symptoms, the patient complains only of the following symptoms, and all other reviewed systems are negative.  See HPI  ALLERGIES: Allergies  Allergen Reactions   Pollen Extract-Tree Extract Other (See Comments)    "HEADACHES, TIRED, DRAINAGE FROM SINUSES"   Molds & Smuts Other (See Comments)    Also dust mites causes sinus infections, h/a etc.    HOME MEDICATIONS: Outpatient Medications Prior to Visit  Medication Sig Dispense Refill   acetaminophen (TYLENOL) 500 MG tablet Take 500 mg by mouth every 6 (six) hours as needed for mild pain or headache.     apixaban (ELIQUIS) 2.5 MG TABS tablet Take 1 tablet (2.5 mg total) by mouth 2 (two) times daily. 60 tablet 11   atorvastatin (LIPITOR) 40 MG tablet Take 1 tablet (40 mg total) by mouth daily at 6 PM. 30 tablet 1   cyanocobalamin (,VITAMIN B-12,) 1000 MCG/ML injection Inject 1,000 mcg into the muscle every 30 (thirty) days.     escitalopram (LEXAPRO) 10 MG tablet Take 10 mg by mouth daily.     FEROSUL 325 (65 Fe) MG tablet Take 325 mg by mouth 2 (two) times daily with a meal.     finasteride (PROSCAR) 5 MG tablet Take 5 mg by mouth daily.     FLONASE SENSIMIST 27.5 MCG/SPRAY nasal spray Place 1 spray into the nose daily as needed for rhinitis or allergies.      Melatonin 10 MG TABS Take 10 mg by mouth at bedtime.     metoprolol succinate (TOPROL XL) 25 MG 24 hr tablet Take 0.5 tablets  (12.5 mg total) by mouth at bedtime. 15 tablet 3   NON FORMULARY Take 1 tablet by mouth See admin instructions. Unnamed laxative: Take 1 tablet by mouth at bedtime as needed for iron-induced constipation     pantoprazole (PROTONIX) 40 MG tablet Take 1 tablet (40 mg total) by mouth 2 (two)  times daily. Take twice daily for 8 weeks and then change in dosing to once daily. 120 tablet 1   Riociguat (ADEMPAS) 2.5 MG TABS Take 2.5 mg by mouth 3 (three) times daily. 90 tablet 11   rOPINIRole (REQUIP) 2 MG tablet Take 2 mg by mouth See admin instructions. Take 2 mg by mouth three times a day and an additional 2 mg once a day as needed for restless legs     torsemide (DEMADEX) 100 MG tablet Take 1 tablet (100 mg total) by mouth daily. 90 tablet 6   torsemide (DEMADEX) 100 MG tablet Take 1 tablet (100 mg total) by mouth daily. 90 tablet 3   traMADol (ULTRAM) 50 MG tablet Take 50 mg by mouth every 6 (six) hours.     traZODone (DESYREL) 100 MG tablet Take 100 mg by mouth at bedtime.     UPTRAVI 1600 MCG TABS Take 1,600 mcg by mouth 2 (two) times daily.     UPTRAVI 200 MCG TABS Take 400 mcg by mouth 2 (two) times daily.     No facility-administered medications prior to visit.    PAST MEDICAL HISTORY: Past Medical History:  Diagnosis Date   Asthma    Barrett's esophagus    Benign prostatic hypertrophy    Bilateral lower extremity edema    CHF (congestive heart failure) (HCC)    Chronic fatigue    COPD (chronic obstructive pulmonary disease) (HCC)    Diabetes mellitus type 2 in nonobese ([place]) 04/24/2019   Diverticulosis of colon (without mention of hemorrhage)    Esophageal reflux    Gastritis    Gluten intolerance    Hiatal hernia 02/10/2011   Noted on CT Scan - Moderate Hiatal Hernia   Hyperlipemia    Hypertension    Lymphoma of lymph nodes in pelvis ([place]) 03/03/2011    Large Right Retroperitoneal Mass   Melanoma ([place])    lymphoma   Microcytic anemia 04/20/2011   NHL (non-Hodgkin's  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 Collene Mares   Pulmonary hyperinflation    Renal cyst 02/10/2011    Noted on CT Scan - Bilateral Renal Cysts   S/P radiation therapy 03/15/11 - 04/09/11   Abdominal/ Pelvic Tumor, 3600 cGy/20 Fractions    PAST SURGICAL HISTORY: Past Surgical History:  Procedure Laterality Date   ARTHROSCOPIC REPAIR ACL     right   BONE MARROW ASPIRATION  02/25/11   Bone Marrow, Aspirate, Clot, and Bilateral Bx, Right PIC   CATARACT EXTRACTION, BILATERAL     ESOPHAGOGASTRODUODENOSCOPY (EGD) WITH PROPOFOL N/A 10/19/2020   Procedure: ESOPHAGOGASTRODUODENOSCOPY (EGD) WITH PROPOFOL;  Surgeon: Sharyn Creamer, MD;  Location: [facility];  Service: Gastroenterology;  Laterality: N/A;   EYE SURGERY     HEMOSTASIS CLIP PLACEMENT  10/19/2020   Procedure: HEMOSTASIS CLIP PLACEMENT;  Surgeon: Sharyn Creamer, MD;  Location: [facility] ENDOSCOPY;  Service: Gastroenterology;;   HERNIA REPAIR     LEFT INGUINAL    INSERTION OF MESH N/A 08/23/2012   Procedure: INSERTION OF MESH;  Surgeon: Madilyn Hook, DO;  Location: [facility];  Service: General;  Laterality: N/A;   LOOP RECORDER INSERTION N/A 04/26/2019   Procedure: LOOP RECORDER INSERTION;  Surgeon: Evans Lance, MD;  Location: [place] CV LAB;  Service: Cardiovascular;  Laterality: N/A;   LUMBAR LAMINECTOMY/DECOMPRESSION  MICRODISCECTOMY Right 12/31/2013   Procedure: RIGHT L4-5 L5-S1 LAMINECTOMY;  Surgeon: Kristeen Miss, MD;  Location: [place] NEURO ORS;  Service: Neurosurgery;  Laterality: Right;  RIGHT L4-5 L5-S1 LAMINECTOMY   POLYPECTOMY  10/19/2020   Procedure: POLYPECTOMY;  Surgeon: Sharyn Creamer, MD;  Location: [facility] ENDOSCOPY;  Service: Gastroenterology;;   RIGHT HEART CATH N/A 07/25/2018   Procedure: RIGHT HEART CATH;  Surgeon: Larey Dresser, MD;  Location: [place];  Service: Cardiovascular;  Laterality: N/A;   ROTATOR CUFF REPAIR     right   TONSILLECTOMY     TONSILLECTOMY     VENTRAL HERNIA REPAIR N/A 08/23/2012   Procedure: HERNIA REPAIR VENTRAL ADULT;  Surgeon: Madilyn Hook, DO;  Location: [facility];  Service: General;  Laterality: N/A;    FAMILY HISTORY: Family History  Problem Relation Age of Onset   Heart disease Father        MI 24   Urticaria Father    Prostate cancer Brother    Prostate cancer Paternal Uncle    Prostate cancer Paternal Uncle    Prostate cancer Paternal Uncle    Hyperlipidemia Other    Stroke Other    Hypertension Other    ADD / ADHD Other    Colon cancer Neg Hx    Allergic rhinitis Neg Hx    Asthma Neg Hx    Eczema Neg Hx     SOCIAL HISTORY: 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[employer], shop  Tobacco Use   Smoking status: Former    Packs/day: 1.00    Years: 35.00    Pack years: 35.00    Types: Pipe, Cigarettes    Quit date: 02/23/1992    Years since quitting: 28.7   Smokeless tobacco: Never   Tobacco comments:    quit 20 years ago  Vaping Use   Vaping Use: Never used  Substance and Sexual Activity   Alcohol use: Not Currently    Comment: 2 drinks daily scotch  AND WINE WITH SUPPER   Drug use: No   Sexual activity: Not on file  Other Topics Concern   Not on file  Social History Narrative   Married - second marriage   He has two children   Retired Horticulturist, commercial Professor   Currently teaches pottery making, 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[facility]   Former Smoker quit 12 years ago- smoked for 35 years   Alcohol use- not currently   Right handed   Social Determinants of Radio broadcast assistant Strain: Not on file  Food Insecurity: Not on file   Transportation Needs: Not on file  Physical Activity: Not on file  Stress: Not on file  Social Connections: Not on file  Intimate Partner Violence: Not on file      PHYSICAL EXAM  Vitals:   10/30/20 0938  BP: 117/67  Pulse: 93  Weight: 175 lb 3.2 oz (79.5 kg)  Height: _0  (1.702 m)   Body mass index is 27.44 kg/m.  Generalized: Well developed, in no acute distress   Neurological examination  Mentation: Alert oriented to time, place, history taking. Follows all commands speech and language fluent Cranial nerve II-XII: Pupils were equal round reactive to  light. Extraocular movements were full, visual field were full on confrontational test.  Head turning and shoulder shrug  were normal and symmetric. Motor: The motor testing reveals 5 over 5 strength of all 4 extremities. Good symmetric motor tone is noted throughout. 4+ pitting edema bilateral lower extremities Sensory: Sensory testing is intact to soft touch on all 4 extremities. No evidence of extinction is noted.  Coordination: Cerebellar testing reveals good finger-nose-finger and heel-to-shin bilaterally.  Gait and station: Uses a Rollator when ambulating. Reflexes: Deep tendon reflexes are symmetric and normal bilaterally.   DIAGNOSTIC DATA (LABS, IMAGING, TESTING) - I reviewed patient records, labs, notes, testing and imaging myself where available.  Lab Results  Component Value Date   WBC 9.6 10/23/2020   HGB 9.1 (L) 10/23/2020   HCT 29.5 (L) 10/23/2020   MCV 78.5 (L) 10/23/2020   PLT 371 10/23/2020      Component Value Date/Time   NA 134 (L) 10/27/2020 1100   NA 139 12/10/2019 1007   NA 143 06/26/2015 0916   K 3.9 10/27/2020 1100   K 3.8 06/26/2015 0916   CL 92 (L) 10/27/2020 1100   CL 105 04/07/2012 0925   CO2 30 10/27/2020 1100   CO2 32 (H) 06/26/2015 0916   GLUCOSE 92 10/27/2020 1100   GLUCOSE 102 06/26/2015 0916   GLUCOSE 86 04/07/2012 0925   BUN 120 (H) 10/27/2020 1100   BUN 56 (H) 12/10/2019  1007   BUN 14.0 06/26/2015 0916   CREATININE 2.50 (H) 10/27/2020 1100   CREATININE 1.0 06/26/2015 0916   CALCIUM 7.5 (L) 10/27/2020 1100   CALCIUM 8.9 06/26/2015 0916   PROT 5.8 (L) 10/18/2020 0348   PROT 6.6 12/10/2019 1007   PROT 6.2 (L) 06/26/2015 0916   ALBUMIN 3.1 (L) 10/18/2020 0348   ALBUMIN 4.4 12/10/2019 1007   ALBUMIN 3.5 06/26/2015 0916   AST 13 (L) 10/18/2020 0348   AST 17 06/26/2015 0916   ALT 11 10/18/2020 0348   ALT 15 06/26/2015 0916   ALKPHOS 79 10/18/2020 0348   ALKPHOS 87 06/26/2015 0916   BILITOT 0.6 10/18/2020 0348   BILITOT <0.2 12/10/2019 1007   BILITOT 0.50 06/26/2015 0916   GFRNONAA 24 (L) 10/27/2020 1100   GFRAA 32 (L) 12/10/2019 1007   Lab Results  Component Value Date   CHOL 159 09/24/2019   HDL 45 09/24/2019   LDLCALC 82 09/24/2019   LDLDIRECT 165.7 12/23/2008   TRIG 159 (H) 09/24/2019   CHOLHDL 3.5 09/24/2019   Lab Results  Component Value Date   HGBA1C 5.9 (H) 10/17/2020   Lab Results  Component Value Date   VITAMINB12 817 12/10/2019   Lab Results  Component Value Date   TSH 1.444 06/11/2011      ASSESSMENT AND PLAN 87 y.o. year old male  has a past medical history of Asthma, Barrett's esophagus, Benign prostatic hypertrophy, Bilateral lower extremity edema, CHF (congestive heart failure) ([place]), Chronic fatigue, COPD (chronic obstructive pulmonary disease) ([place]), Diabetes mellitus type 2 in nonobese ([place]) (04/24/2019), Diverticulosis of colon (without mention of hemorrhage), Esophageal reflux, Gastritis, Gluten intolerance, Hiatal hernia (02/10/2011), Hyperlipemia, Hypertension, Lymphoma of lymph nodes in pelvis ([place]) (03/03/2011), Melanoma ([place]), Microcytic anemia (04/20/2011), NHL (non-Hodgkin's lymphoma) ([place]), Osteoarthritis, Periaortic lymphadenopathy (02/16/2011), Personal history of colonic polyps (2004), Pulmonary hyperinflation, Renal cyst (02/10/2011), and S/P radiation therapy (03/15/11 - 04/09/11). here with:  1.  Restless leg  syndrome   Continue Requip 2 mg 3 times a day Occasionally the patient has to take a  4th tablet for breakthrough symptoms.  If so in the future he may need a sooner refill which I will allow. His PCP is setting him up with an iron infusion next week which should also help his symptoms He will follow-up in 6 months or sooner if needed   Ward Givens, MSN, NP-C 10/30/2020, 9:50 AM [facility] [address] [place], [place] [address] [phone]

## 2020-10-31 ENCOUNTER — Ambulatory Visit (HOSPITAL_COMMUNITY)
Admission: RE | Admit: 2020-10-31 | Discharge: 2020-10-31 | Disposition: A | Discharge status: Home / Self Care | Payer: Medicare HMO | Source: Ambulatory Visit | Attending: Cardiology | Admitting: Cardiology

## 2020-10-31 ENCOUNTER — Other Ambulatory Visit: Payer: Self-pay

## 2020-10-31 DIAGNOSIS — N179 Acute kidney failure, unspecified: Secondary | ICD-10-CM | POA: Diagnosis not present

## 2020-10-31 DIAGNOSIS — I5032 Chronic diastolic (congestive) heart failure: Secondary | ICD-10-CM | POA: Insufficient documentation

## 2020-10-31 LAB — BASIC METABOLIC PANEL
Anion gap: 8 (ref 5–15)
BUN: 76 mg/dL — ABNORMAL HIGH (ref 8–23)
CO2: 30 mmol/L (ref 22–32)
Calcium: 7.9 mg/dL — ABNORMAL LOW (ref 8.9–10.3)
Chloride: 98 mmol/L (ref 98–111)
Creatinine, Ser: 1.82 mg/dL — ABNORMAL HIGH (ref 0.61–1.24)
GFR, Estimated: 36 mL/min — ABNORMAL LOW (ref >60)
Glucose, Bld: 93 mg/dL (ref 70–99)
Potassium: 4.7 mmol/L (ref 3.5–5.1)
Sodium: 136 mmol/L (ref 135–145)

## 2020-11-03 ENCOUNTER — Encounter (HOSPITAL_COMMUNITY)
Admission: RE | Admit: 2020-11-03 | Discharge: 2020-11-03 | Disposition: A | Discharge status: Home / Self Care | Payer: Medicare HMO | Source: Ambulatory Visit | Attending: Cardiology | Admitting: Cardiology

## 2020-11-03 DIAGNOSIS — D509 Iron deficiency anemia, unspecified: Secondary | ICD-10-CM | POA: Insufficient documentation

## 2020-11-03 DIAGNOSIS — I5032 Chronic diastolic (congestive) heart failure: Secondary | ICD-10-CM | POA: Insufficient documentation

## 2020-11-03 MED ORDER — SODIUM CHLORIDE 0.9 % IV SOLN
510.0000 mg | INTRAVENOUS | Status: DC
  Administered 2020-11-03: 510 mg via INTRAVENOUS
  Filled 2020-11-03: qty 510

## 2020-11-05 ENCOUNTER — Other Ambulatory Visit (HOSPITAL_COMMUNITY): Payer: Self-pay | Admitting: *Deleted

## 2020-11-05 ENCOUNTER — Telehealth (HOSPITAL_COMMUNITY): Payer: Self-pay | Admitting: *Deleted

## 2020-11-05 DIAGNOSIS — I5032 Chronic diastolic (congestive) heart failure: Secondary | ICD-10-CM

## 2020-11-05 NOTE — Telephone Encounter (Signed)
Pt left vm c/o weakness after iron infusion on Monday. Pt said he has continued to feel worse and worse. Pt said his bp is low normal, temp 96.8, he is achy, trouble standing because of weakness, and just all around feels "off". Per Dr.McLean pt needs cbc and bmet. Called pt to schedule lab appt no answer/left vm requesting return call.

## 2020-11-05 NOTE — Telephone Encounter (Signed)
Pt returned call lab appt scheduled.

## 2020-11-06 ENCOUNTER — Ambulatory Visit (HOSPITAL_COMMUNITY)
Admission: RE | Admit: 2020-11-06 | Discharge: 2020-11-06 | Disposition: A | Discharge status: Home / Self Care | Payer: Medicare HMO | Source: Ambulatory Visit | Attending: Cardiology | Admitting: Cardiology

## 2020-11-06 ENCOUNTER — Other Ambulatory Visit: Payer: Self-pay

## 2020-11-06 DIAGNOSIS — I5032 Chronic diastolic (congestive) heart failure: Secondary | ICD-10-CM | POA: Diagnosis present

## 2020-11-06 LAB — CBC
HCT: 26.2 % — ABNORMAL LOW (ref 39.0–52.0)
Hemoglobin: 7.4 g/dL — ABNORMAL LOW (ref 13.0–17.0)
MCH: 23.3 pg — ABNORMAL LOW (ref 26.0–34.0)
MCHC: 28.2 g/dL — ABNORMAL LOW (ref 30.0–36.0)
MCV: 82.6 fL (ref 80.0–100.0)
Platelets: 384 10*3/uL (ref 150–400)
RBC: 3.17 MIL/uL — ABNORMAL LOW (ref 4.22–5.81)
RDW: 21 % — ABNORMAL HIGH (ref 11.5–15.5)
WBC: 7.4 10*3/uL (ref 4.0–10.5)
nRBC: 0 % (ref 0.0–0.2)

## 2020-11-06 LAB — BASIC METABOLIC PANEL
Anion gap: 9 (ref 5–15)
BUN: 56 mg/dL — ABNORMAL HIGH (ref 8–23)
CO2: 28 mmol/L (ref 22–32)
Calcium: 7.8 mg/dL — ABNORMAL LOW (ref 8.9–10.3)
Chloride: 100 mmol/L (ref 98–111)
Creatinine, Ser: 1.95 mg/dL — ABNORMAL HIGH (ref 0.61–1.24)
GFR, Estimated: 33 mL/min — ABNORMAL LOW (ref >60)
Glucose, Bld: 122 mg/dL — ABNORMAL HIGH (ref 70–99)
Potassium: 4.3 mmol/L (ref 3.5–5.1)
Sodium: 137 mmol/L (ref 135–145)

## 2020-11-07 ENCOUNTER — Encounter (HOSPITAL_COMMUNITY): Payer: Medicare HMO

## 2020-11-10 ENCOUNTER — Other Ambulatory Visit: Payer: Self-pay

## 2020-11-10 ENCOUNTER — Encounter (HOSPITAL_COMMUNITY)
Admission: RE | Admit: 2020-11-10 | Discharge: 2020-11-10 | Disposition: A | Discharge status: Home / Self Care | Payer: Medicare HMO | Source: Ambulatory Visit | Attending: Cardiology | Admitting: Cardiology

## 2020-11-10 ENCOUNTER — Telehealth (HOSPITAL_COMMUNITY): Payer: Self-pay | Admitting: *Deleted

## 2020-11-10 DIAGNOSIS — I5032 Chronic diastolic (congestive) heart failure: Secondary | ICD-10-CM

## 2020-11-10 DIAGNOSIS — D509 Iron deficiency anemia, unspecified: Secondary | ICD-10-CM

## 2020-11-10 MED ORDER — SODIUM CHLORIDE 0.9 % IV SOLN
510.0000 mg | INTRAVENOUS | Status: DC
  Administered 2020-11-10: 510 mg via INTRAVENOUS
  Filled 2020-11-10: qty 510

## 2020-11-10 NOTE — Telephone Encounter (Signed)
-----  Message from Larey Dresser, MD sent at 11/07/2020  1:18 AM EDT ----- Creatinine stable.  Hgb trending down again, hgb 7.4.  Worried about recurrent GI bleeding.  - Would arrange transfusion of 1 unit PRBCs in Short Stay.  On the day he gets blood, take torsemide 100 mg bid (then back to 100 mg daily).  - Would arrange appt with GI asap.  - Repeat CBC 1 week.

## 2020-11-10 NOTE — Telephone Encounter (Signed)
Ryan [name], Ryan [name]  11/10/2020  2:08 PM EDT Back to Top    Infusion clinic called clinic to see if pt to receive feraheme today. Pt c/o adverse reactions per Dr.McLean symptoms more than likely related to low hemoglobin rather than infusion itself . Feraheme ordered today. Also appt scheduled for pt to receive 1 unit PRBCs on 10/17 with next feraheme dose. Will call pt directly to give torsemide instructions on day of transfusion. Will repeat cbc 1 week after transfusion 10/24. Pt aware to schedule asap GI appt.    Ryan [name], [name]  11/07/2020  2:59 PM EDT     Patient called.  Left message for patient to call back.[phone] Earley Abide, MD  11/07/2020  1:18 AM EDT     Creatinine stable.  Hgb trending down again, hgb 7.4.  Worried about recurrent GI bleeding.  - Would arrange transfusion of 1 unit PRBCs in Short Stay.  On the day he gets blood, take torsemide 100 mg bid (then back to 100 mg daily).  - Would arrange appt with GI asap.  - Repeat CBC 1 week.

## 2020-11-10 NOTE — Progress Notes (Signed)
1409 IV Feraheme stopped due to patient complaining of chest pain. Facial flushing noted. VSS (see flowsheets). Silvis who informed Dr. Aundra Dubin. Orders placed for 1 unit of blood on 11/18/20. After stopping Feraheme chest pain stopped. Vital signs remained stable. Okay for patient to DC home per Dr. Aundra Dubin.

## 2020-11-12 NOTE — Progress Notes (Signed)
PCP: Roetta Sessions, NP Cardiology: Dr. Aundra Dubin  87 y.o. with history of interstitial lung disease (?NSIP vs UIP) on home oxygen, pulmonary hypertension, diastolic CHF, and prior non-Hodgkins lymphoma (pelvic mass, s/p XRT) presents for followup of CHF and pulmonary hypertension.  Patient has history of reportedly mild ILD (NSIP vs UIP) followed by a pulmonologist at [facility] and pulmonary hypertension thought to be out of proportion lung disease followed by a physician at [facility].  Last CT chest was in 11/19, showing stable ILD (?UIP vs NSIP).  He had RHC in 2/20 with moderate PAH, PVR 5.1 WU.  He was tried on Adcirca but developed cognitive problems and worsening of peripheral edema so this was stopped.  He has been on home oxygen, uses at night and with exertion.    He was admitted in 6/20 with worsening dyspnea and peripheral edema, weight up despite compliance with torsemide 40 mg bid at home.  He has CKD stage 3 and torsemide was decreased in the spring. He was diuresed aggressively with fall in weight.  Echo showed normal LV size and systolic function, RV reportedly normal.  6/20 RHC showed normal filling pressures with moderate pulmonary hypertension, PVR 5.35 WU.   After discharge from hospital, patient started on ambrisentan.  His weight steadily began to increase and he developed worsening lower extremity edema, we stopped ambrisentan.   Patient was admitted in 8/20 with AKI after taking metolazone for several days.  He was admitted again in 8/20 with acute on chronic diastolic CHF. He was diuresed.  Wandering atrial pacemaker noted.   He was admitted in 3/21 with suspected CVA, MRI head showed punctate occipital lobe infarct, thought to be due to small vessel disease. ILR was implanted.  Echo in 3/21 showed EF 60-65%, severe LVH, normal RV.  PYP scan in 3/21 was not suggestive of TTR amyloidosis.  Subsequently, ILR has shown paroxysmal atrial fibrillation.   He was admitted later in  3/21 with AKI, creatinine up to 5.  However, he corrected rapidly to baseline with IVF and creatinine was 1.02 when discharge.   Echo in 6/22 showed EF 65-70% with mild LVH, normal RV, IVC normal, PASP estimated 29 mmHg.   Patient was admitted in 9/22 with upper GI bleed from bleeding gastric polyps.  He had polypectomy and has been restarted on Eliquis.   He was seen in the office 10/23/20 and BMET showed creatinine up to 4.18.  He was also noted to be Fe deficient.  Torsemide, metolazone, and spironolactone were stopped. Follow up he felt better. Planned to resume torsemide in a couple days, continue to hold spiro and metolazone. Feraheme infusions arranged.  Today he returns for HF follow up with his wife. Breathing is at baseline but ankles/leg swelled considerably last night. He feels weak today but attributes this to rushing to get things done today. Denies CP, abnormal bleeding, dizziness, or PND/Orthopnea. Appetite ok. No fever or chills. Taking all medications provided to him at his facility, although wife is not sure he has stopped spiro. He had 2nd iron infusion last week and he had immediate chest heaviness and face became flushed, he was panting.   6 minute walk (8/21): 122 m 6 minute walk (11/21): 244 m 6 minute walk (3/22): 122 m  Labs (6/20): K 4.7, creatinine 1.46 Labs (7/20): K 3.1, creatinine 1.67 Labs (8/20): K 3.9, creatinine 1.8 Labs (9/20): K 4.9, creatinine 1.76, BNP 78 Labs (10/20): K 4.2, creatininine 1.5 Labs (11/20): BNP 36, K  4.3, creatinine 1.4 Labs (3/21): myeloma panel negative, urine immunofixation negative.  Labs (4/21): K 4.5, creatinine 1.02, hgb 11.4 Labs (6/21): K 5.1, creatinine 2.2, hgb 10.4 Labs (8/21): LDL 82 Labs (11/21): K 5.3, creatinine 2.11 Labs (3/22): K 3.9, creatinine 2.31 Labs (4/22): K 4.1, creatinine 2.15 Labs (9/22): hgb 8.6 => 9.1, creatinine 1.5 => 4.18, BUN 85, BNP 38.5  PMH: 1. Non-Hodgkin's lymphoma: Pelvic involvement.  S/p  radiation.  Thought to be in remission (Dr. Benay Spice).  2. HTN 3. PACs/PVCs 4. GERD: Barrett's esophagus.  5. 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  - High resolution CT chest 11/19: ILD, ?UIP pattern.  6. Pulmonary hypertension: Thought to be out of proportion to underlying lung disease (ILD, ?NSIP vs UIP).   - RHC (2/20): mean RA 10, PASP 60 with mean 42, mean PCWP 11, CI 3, PVR 5.1 WU.  - Trial of Adcirca => failed due to ?memory worsening/cognitive affects and peripheral edema.  - Echo (6/20): EF 60-65%, mild LVH, RV reportedly normal, PASP 59 mmHg.  - RHC (6/20): mean RA 7, PA 50/24 mean 34, mean PCWP 12, CI 2.01, PAPI 3.7, PVR 5.35 WU - RF negative, ANA negative, anti-SCL70 negative, anti-centromere negative.  - V/Q scan (8/20): No evidence for chronic PE.  - Echo (3/21): EF 60-65%, severe LVH, normal RV.  - Echo (6/22): EF 65-70% with mild LVH, normal RV, IVC normal, PASP estimated 29 mmHg. 7. CKD: Stage 3.   8. Wandering atrial pacemaker 9. OSA 10. PYP scan (3/21): H/CL ratio 1.0, grade 0.  11. CVA: MRI 3/21 with punctate occipital lobe infarct thought to be due to small vessel disease. - Carotid dopplers (3/21): 40-59% RICA stenosis.  - Carotid dopplers (4/22): 40-59% RICA stenosis.  12. Atrial fibrillation: Paroxysmal, noted on ILR.  13. GI bleeding 9/22 from gastric polyps.   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  Tobacco Use   Smoking status: Former    Packs/day: 1.00    Years: 35.00    Pack years: 35.00    Types: Pipe, Cigarettes    Quit date: 02/23/1992    Years since quitting: 28.7   Smokeless tobacco: Never   Tobacco comments:    quit 20 years ago  Vaping Use   Vaping Use: Never used  Substance and Sexual Activity   Alcohol  use: Not Currently    Comment: 2 drinks daily scotch  AND WINE WITH SUPPER   Drug use: No   Sexual activity: Not on file  Other Topics Concern   Not on file  Social History Narrative   Married - second marriage   He has two children   Retired Horticulturist, commercial Professor   Currently teaches pottery making, 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[facility]   Former Smoker quit 12 years ago- smoked for 35 years   Alcohol use- not currently   Right handed   Social Determinants of Radio broadcast assistant Strain: Not on file  Food Insecurity: Not on file  Transportation Needs: Not on file  Physical Activity: Not on file  Stress: Not on file  Social Connections: Not on file  Intimate Partner Violence: Not on file   Family History  Problem Relation Age  of Onset   Heart disease Father        MI 14   Urticaria Father    Prostate cancer Brother    Prostate cancer Paternal Uncle    Prostate cancer Paternal Uncle    Prostate cancer Paternal Uncle    Hyperlipidemia Other    Stroke Other    Hypertension Other    ADD / ADHD Other    Colon cancer Neg Hx    Allergic rhinitis Neg Hx    Asthma Neg Hx    Eczema Neg Hx    ROS: All systems reviewed and negative except as per HPI.   Current Outpatient Medications  Medication Sig Dispense Refill   acetaminophen (TYLENOL) 500 MG tablet Take 500 mg by mouth every 6 (six) hours as needed for mild pain or headache.     apixaban (ELIQUIS) 2.5 MG TABS tablet Take 1 tablet (2.5 mg total) by mouth 2 (two) times daily. 60 tablet 11   atorvastatin (LIPITOR) 40 MG tablet Take 1 tablet (40 mg total) by mouth daily at 6 PM. 30 tablet 1   cyanocobalamin (,VITAMIN B-12,) 1000 MCG/ML injection Inject 1,000 mcg into the muscle every 30 (thirty) days.     escitalopram (LEXAPRO) 10 MG tablet Take 10 mg by mouth daily.     finasteride (PROSCAR) 5 MG tablet Take 5 mg by mouth daily.     FLONASE SENSIMIST 27.5 MCG/SPRAY nasal spray Place 1 spray into the nose daily as needed  for rhinitis or allergies.      Melatonin 10 MG TABS Take 10 mg by mouth at bedtime.     metoprolol succinate (TOPROL XL) 25 MG 24 hr tablet Take 0.5 tablets (12.5 mg total) by mouth at bedtime. 15 tablet 3   NON FORMULARY Take 1 tablet by mouth See admin instructions. Unnamed laxative: Take 1 tablet by mouth at bedtime as needed for iron-induced constipation     pantoprazole (PROTONIX) 40 MG tablet Take 1 tablet (40 mg total) by mouth 2 (two) times daily. Take twice daily for 8 weeks and then change in dosing to once daily. 120 tablet 1   Riociguat (ADEMPAS) 2.5 MG TABS Take 2.5 mg by mouth 3 (three) times daily. 90 tablet 11   rOPINIRole (REQUIP) 2 MG tablet Take 2 mg by mouth See admin instructions. Take 2 mg by mouth three times a day and an additional 2 mg once a day as needed for restless legs     torsemide (DEMADEX) 100 MG tablet Take 1 tablet (100 mg total) by mouth daily. 90 tablet 6   traMADol (ULTRAM) 50 MG tablet Take 50 mg by mouth every 6 (six) hours. As needed     traZODone (DESYREL) 100 MG tablet Take 100 mg by mouth at bedtime.     UPTRAVI 1600 MCG TABS Take 1,600 mcg by mouth 2 (two) times daily.     UPTRAVI 200 MCG TABS Take 400 mcg by mouth 2 (two) times daily.     No current facility-administered medications for this encounter.   BP 112/68   Pulse 78   Wt 82.4 kg (181 lb 9.6 oz)   SpO2 93%   BMI 28.44 kg/m   Wt Readings from Last 3 Encounters:  11/13/20 82.4 kg (181 lb 9.6 oz)  10/30/20 79.5 kg (175 lb 3.2 oz)  10/27/20 78.1 kg (172 lb 3.2 oz)   General:  NAD. No resp difficulty, arrived in [facility] on oxygen. HEENT: Normal Neck: Supple. No JVD. Carotids  2+ bilat; no bruits. No lymphadenopathy or thryomegaly appreciated. Cor: PMI nondisplaced. Regular rate & rhythm. No rubs, gallops, 2/6 SEM RUSB Lungs: Clear Abdomen: Soft, nontender, nondistended. No hepatosplenomegaly. No bruits or masses. Good bowel sounds. Extremities: No cyanosis, clubbing, rash, 3+ BLE pre-tibial  edema Neuro: Alert & oriented x 3, cranial nerves grossly intact. Moves all 4 extremities w/o difficulty. Affect pleasant.  Assessment/Plan: 1. Acute on chronic diastolic CHF: Suspect with significant component of RV failure from pulmonary hypertension.  Echo in 6/20 with EF 60-65%, RV not well-visualized but PASP 59 mmHg.  Echo (3/21) with EF 60-65%, severe LVH, normal RV, PASP 28 mmHg.  PYP scan was not suggestive of TTR cardiac amyloidosis. Echo in 6/22 showed EF 65-70% with mild LVH, normal RV, IVC normal, PASP estimated 29 mmHg.  NYHA class III symptoms.  He is volume overloaded today on exam, weight up 6 lbs.   - Increase torsemide back to 100 mg bid. BMET today, repeat next week.   - Stay off spironolactone + metolazone for now. Will confirm with facility that he is not being given these medications. 2. CKD Stage 3: BMET today, repeat next week. 3. Pulmonary hypertension: Patient thought to have Lueders out of proportion to his lung disease (ILD, NSIP versus UIP). RHC in 2/20 showed mean PA pressure 41, PVR 5.1 WU, CI 3. Echo in 6/20 with RV reportedly ok => I reviewed and do not think that RV was well-visualized.  Patient did not tolerate Adcirca due to edema/?cognitive effects.  RHC was done again in 6/20, showed moderate pulmonary arterial hypertension with PVR 5.35 (comparable to prior).  Serological workup for PH was negative. Patient started ambrisentan 5 mg and developed significant edema/volume overload despite a high dose of torsemide. He is now on Uptravi + riociguat.  Most recent echo in 6/22 showed normal RV size/systolic function and PA systolic pressure estimate was normal.   - Unable to complete 6 minute walk today. - he will stay off ambrisentan (?trigger for volume overload).  - As above, he did not tolerate tadalafil.  - He is at 2000 mcg bid Uptravi.  With excellent echo in 6/22, will keep at this dose.   - Continue riociguat at goal dose 2.5 mg tid.  4. Atrial fibrillation:  Paroxysmal, noted on ILR. Regular on exam today.  - Continue apixaban 2.5 mg bid (dosed for age, renal dysfunction).  5. OSA: Cannot tolerate CPAP.  6. CVA: In 3/21, may have been atrial fibrillation-related.  On apixaban now.  7. Carotid stenosis: 40-59% RICA stenosis on 4/22 dopplers.  - Repeat 4/23.    8. Interstitial lung disease:  Has seen pulmonary, ILD seems mild and stable.  9. Fe deficiency: After recent GI bleeding. He completed 1 infusion and felt poor, had reaction during 2nd infusion. - He will have 1 unit PRBCs next week.  Followup next week with APP with BMET. May need to add back every other week metolazone.  Villalba FNP 11/13/2020

## 2020-11-13 ENCOUNTER — Other Ambulatory Visit: Payer: Self-pay

## 2020-11-13 ENCOUNTER — Telehealth (HOSPITAL_COMMUNITY): Payer: Self-pay

## 2020-11-13 ENCOUNTER — Encounter (HOSPITAL_COMMUNITY): Payer: Self-pay

## 2020-11-13 ENCOUNTER — Ambulatory Visit (HOSPITAL_COMMUNITY)
Admission: RE | Admit: 2020-11-13 | Discharge: 2020-11-13 | Disposition: A | Discharge status: Home / Self Care | Payer: Medicare HMO | Source: Ambulatory Visit | Attending: Family Medicine | Admitting: Family Medicine

## 2020-11-13 VITALS — BP 112/68 | HR 78 | Wt 181.6 lb

## 2020-11-13 DIAGNOSIS — I130 Hypertensive heart and chronic kidney disease with heart failure and stage 1 through stage 4 chronic kidney disease, or unspecified chronic kidney disease: Secondary | ICD-10-CM | POA: Diagnosis not present

## 2020-11-13 DIAGNOSIS — I272 Pulmonary hypertension, unspecified: Secondary | ICD-10-CM | POA: Diagnosis not present

## 2020-11-13 DIAGNOSIS — Z8673 Personal history of transient ischemic attack (TIA), and cerebral infarction without residual deficits: Secondary | ICD-10-CM | POA: Diagnosis not present

## 2020-11-13 DIAGNOSIS — N183 Chronic kidney disease, stage 3 unspecified: Secondary | ICD-10-CM | POA: Insufficient documentation

## 2020-11-13 DIAGNOSIS — Z7901 Long term (current) use of anticoagulants: Secondary | ICD-10-CM | POA: Insufficient documentation

## 2020-11-13 DIAGNOSIS — Z87891 Personal history of nicotine dependence: Secondary | ICD-10-CM | POA: Diagnosis not present

## 2020-11-13 DIAGNOSIS — Z79899 Other long term (current) drug therapy: Secondary | ICD-10-CM | POA: Insufficient documentation

## 2020-11-13 DIAGNOSIS — Z9981 Dependence on supplemental oxygen: Secondary | ICD-10-CM | POA: Insufficient documentation

## 2020-11-13 DIAGNOSIS — G4733 Obstructive sleep apnea (adult) (pediatric): Secondary | ICD-10-CM | POA: Insufficient documentation

## 2020-11-13 DIAGNOSIS — Z8249 Family history of ischemic heart disease and other diseases of the circulatory system: Secondary | ICD-10-CM | POA: Diagnosis not present

## 2020-11-13 DIAGNOSIS — I480 Paroxysmal atrial fibrillation: Secondary | ICD-10-CM | POA: Insufficient documentation

## 2020-11-13 DIAGNOSIS — I2721 Secondary pulmonary arterial hypertension: Secondary | ICD-10-CM | POA: Diagnosis not present

## 2020-11-13 DIAGNOSIS — J8410 Pulmonary fibrosis, unspecified: Secondary | ICD-10-CM | POA: Diagnosis not present

## 2020-11-13 DIAGNOSIS — N1831 Chronic kidney disease, stage 3a: Secondary | ICD-10-CM | POA: Diagnosis not present

## 2020-11-13 DIAGNOSIS — I5032 Chronic diastolic (congestive) heart failure: Secondary | ICD-10-CM

## 2020-11-13 DIAGNOSIS — I5033 Acute on chronic diastolic (congestive) heart failure: Secondary | ICD-10-CM | POA: Insufficient documentation

## 2020-11-13 DIAGNOSIS — D509 Iron deficiency anemia, unspecified: Secondary | ICD-10-CM

## 2020-11-13 DIAGNOSIS — J849 Interstitial pulmonary disease, unspecified: Secondary | ICD-10-CM

## 2020-11-13 DIAGNOSIS — K922 Gastrointestinal hemorrhage, unspecified: Secondary | ICD-10-CM | POA: Diagnosis not present

## 2020-11-13 DIAGNOSIS — I6523 Occlusion and stenosis of bilateral carotid arteries: Secondary | ICD-10-CM

## 2020-11-13 LAB — BASIC METABOLIC PANEL
Anion gap: 11 (ref 5–15)
BUN: 73 mg/dL — ABNORMAL HIGH (ref 8–23)
CO2: 28 mmol/L (ref 22–32)
Calcium: 8.1 mg/dL — ABNORMAL LOW (ref 8.9–10.3)
Chloride: 98 mmol/L (ref 98–111)
Creatinine, Ser: 2.1 mg/dL — ABNORMAL HIGH (ref 0.61–1.24)
GFR, Estimated: 30 mL/min — ABNORMAL LOW (ref >60)
Glucose, Bld: 91 mg/dL (ref 70–99)
Potassium: 4.2 mmol/L (ref 3.5–5.1)
Sodium: 137 mmol/L (ref 135–145)

## 2020-11-13 MED ORDER — TORSEMIDE 100 MG PO TABS: 100.0000 mg | ORAL_TABLET | Freq: Two times a day (BID) | ORAL | 3 refills | Status: DC

## 2020-11-13 NOTE — Patient Instructions (Addendum)
Labs done today. We will contact you only if your labs are abnormal.  INCREASE Torsemide to 132m (1 tablet) by mouth 2 times daily.   STOP taking Spironolactone  STOP taking Inspra  No other medication changes were made. Please continue all current medications as prescribed.  You have been referred to Gastroenterology. They will contact you to schedule an appointment.   Your physician recommends that you keep your scheduled appointment on Wednesday October 19th 2022 at 3:00pm with our NCobbCode: [id number]  If you have any questions or concerns before your next appointment please send u[place]aa message through mSt[place]or call our office at [phone]    TO LEAVE A MESSAGE FOR THE NURSE SELECT OPTION 2, PLEASE LEAVE A MESSAGE INCLUDING: YOUR NAME DATE OF BIRTH CALL BACK NUMBER REASON FOR CALL**this is important as we prioritize the call backs  YOU WILL RECEIVE A CALL BACK THE SAME DAY AS LONG AS YOU CALL BEFORE 4:00 PM   Do the following things EVERYDAY: Weigh yourself in the morning before breakfast. Write it down and keep it in a log. Take your medicines as prescribed Eat low salt foods--Limit salt (sodium) to 2000 mg per day.  Stay as active as you can everyday Limit all fluids for the day to less than 2 liters   [facility] you and your health needs are our priority. As part of our continuing mission to provide you with exceptional heart care, we have created designated Provider Care Teams. These Care Teams include your primary Cardiologist (physician) and Advanced Practice Providers (APPs- Physician Assistants and Nurse Practitioners) who all work together to provide you with the care you need, when you need it.   You may see any of the following providers on your designated Care Team at your next follow up: Dr DGlori BickersDr DHaynes Kerns NP BLyda Jester PUtahLAudry Riles PharmD   Please be sure to bring in all your  medications bottles to every appointment.

## 2020-11-13 NOTE — Telephone Encounter (Signed)
Called patients assisted living facility to review the medication changes that were made in office today. Med changes were reviewed with nurse Brionna.  CB#[phone]

## 2020-11-17 ENCOUNTER — Encounter (HOSPITAL_COMMUNITY): Payer: Medicare HMO

## 2020-11-17 ENCOUNTER — Other Ambulatory Visit (HOSPITAL_BASED_OUTPATIENT_CLINIC_OR_DEPARTMENT_OTHER): Payer: Self-pay | Admitting: Cardiology

## 2020-11-17 ENCOUNTER — Ambulatory Visit (INDEPENDENT_AMBULATORY_CARE_PROVIDER_SITE_OTHER): Payer: Medicare HMO

## 2020-11-17 DIAGNOSIS — I639 Cerebral infarction, unspecified: Secondary | ICD-10-CM | POA: Diagnosis not present

## 2020-11-17 DIAGNOSIS — I6521 Occlusion and stenosis of right carotid artery: Secondary | ICD-10-CM

## 2020-11-18 ENCOUNTER — Ambulatory Visit (HOSPITAL_COMMUNITY)
Admission: RE | Admit: 2020-11-18 | Discharge: 2020-11-18 | Disposition: A | Discharge status: Home / Self Care | Payer: Medicare HMO | Source: Ambulatory Visit | Attending: Cardiology | Admitting: Cardiology

## 2020-11-18 ENCOUNTER — Other Ambulatory Visit (HOSPITAL_COMMUNITY): Payer: Self-pay

## 2020-11-18 ENCOUNTER — Other Ambulatory Visit: Payer: Self-pay

## 2020-11-18 DIAGNOSIS — D509 Iron deficiency anemia, unspecified: Secondary | ICD-10-CM | POA: Diagnosis present

## 2020-11-18 LAB — PREPARE RBC (CROSSMATCH)

## 2020-11-18 MED ORDER — SODIUM CHLORIDE 0.9% IV SOLUTION: Freq: Once | INTRAVENOUS | Status: DC

## 2020-11-18 NOTE — Progress Notes (Signed)
PCP: Roetta Sessions, NP Cardiology: Dr. Aundra Dubin  87 y.o. with history of interstitial lung disease (?NSIP vs UIP) on home oxygen, pulmonary hypertension, diastolic CHF, and prior non-Hodgkins lymphoma (pelvic mass, s/p XRT) presents for followup of CHF and pulmonary hypertension.  Patient has history of reportedly mild ILD (NSIP vs UIP) followed by a pulmonologist at [facility] and pulmonary hypertension thought to be out of proportion lung disease followed by a physician at [facility].  Last CT chest was in 11/19, showing stable ILD (?UIP vs NSIP).  He had RHC in 2/20 with moderate PAH, PVR 5.1 WU.  He was tried on Adcirca but developed cognitive problems and worsening of peripheral edema so this was stopped.  He has been on home oxygen, uses at night and with exertion.    He was admitted in 6/20 with worsening dyspnea and peripheral edema, weight up despite compliance with torsemide 40 mg bid at home.  He has CKD stage 3 and torsemide was decreased in the spring. He was diuresed aggressively with fall in weight.  Echo showed normal LV size and systolic function, RV reportedly normal.  6/20 RHC showed normal filling pressures with moderate pulmonary hypertension, PVR 5.35 WU.   After discharge from hospital, patient started on ambrisentan.  His weight steadily began to increase and he developed worsening lower extremity edema, we stopped ambrisentan.   Patient was admitted in 8/20 with AKI after taking metolazone for several days.  He was admitted again in 8/20 with acute on chronic diastolic CHF. He was diuresed.  Wandering atrial pacemaker noted.   He was admitted in 3/21 with suspected CVA, MRI head showed punctate occipital lobe infarct, thought to be due to small vessel disease. ILR was implanted.  Echo in 3/21 showed EF 60-65%, severe LVH, normal RV.  PYP scan in 3/21 was not suggestive of TTR amyloidosis.  Subsequently, ILR has shown paroxysmal atrial fibrillation.   He was admitted later in  3/21 with AKI, creatinine up to 5.  However, he corrected rapidly to baseline with IVF and creatinine was 1.02 when discharge.   Echo in 6/22 showed EF 65-70% with mild LVH, normal RV, IVC normal, PASP estimated 29 mmHg.   Patient was admitted in 9/22 with upper GI bleed from bleeding gastric polyps.  He had polypectomy and has been restarted on Eliquis.   He was seen in the office 10/23/20 and BMET showed creatinine up to 4.18.  He was also noted to be Fe deficient.  Torsemide, metolazone, and spironolactone were stopped. Follow up he felt better. Planned to resume torsemide in a couple days, continue to hold spiro and metolazone. Feraheme infusions arranged.  Today he returns for HF follow up with his wife. Breathing is at baseline but ankles/leg swelled considerably last night. He feels weak today but attributes this to rushing to get things done today. Denies CP, abnormal bleeding, dizziness, or PND/Orthopnea. Appetite ok. No fever or chills. Taking all medications provided to him at his facility, although wife is not sure he has stopped spiro. He had 2nd iron infusion last week and he had immediate chest heaviness and face became flushed, he was panting.   Today he returns for HF follow up with his wife. He feels somewhat better today but remains fatigued and attributes this to poor sleep over the past few nights. He received 1 unit of PRBCs yesterday. He did not wear his oxygen to clinic today, says his oxygen sats go down to 80's without O2 but  quickly recover to high 90's when he puts it back on. Ankles remain swollen but some better. Denies CP, dizziness,or PND/Orthopnea. Appetite ok. No fever or chills.  Taking all medications provided at facility.   6 minute walk (8/21): 122 m 6 minute walk (11/21): 244 m 6 minute walk (3/22): 122 m  Labs (6/20): K 4.7, creatinine 1.46 Labs (7/20): K 3.1, creatinine 1.67 Labs (8/20): K 3.9, creatinine 1.8 Labs (9/20): K 4.9, creatinine 1.76, BNP 78 Labs  (10/20): K 4.2, creatininine 1.5 Labs (11/20): BNP 36, K 4.3, creatinine 1.4 Labs (3/21): myeloma panel negative, urine immunofixation negative.  Labs (4/21): K 4.5, creatinine 1.02, hgb 11.4 Labs (6/21): K 5.1, creatinine 2.2, hgb 10.4 Labs (8/21): LDL 82 Labs (11/21): K 5.3, creatinine 2.11 Labs (3/22): K 3.9, creatinine 2.31 Labs (4/22): K 4.1, creatinine 2.15 Labs (9/22): hgb 8.6 => 9.1, creatinine 1.5 => 4.18, BUN 85, BNP 38.5  PMH: 1. Non-Hodgkin's lymphoma: Pelvic involvement.  S/p radiation.  Thought to be in remission (Dr. Benay Spice).  2. HTN 3. PACs/PVCs 4. GERD: Barrett's esophagus.  5. 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  - High resolution CT chest 11/19: ILD, ?UIP pattern.  6. Pulmonary hypertension: Thought to be out of proportion to underlying lung disease (ILD, ?NSIP vs UIP).   - RHC (2/20): mean RA 10, PASP 60 with mean 42, mean PCWP 11, CI 3, PVR 5.1 WU.  - Trial of Adcirca => failed due to ?memory worsening/cognitive affects and peripheral edema.  - Echo (6/20): EF 60-65%, mild LVH, RV reportedly normal, PASP 59 mmHg.  - RHC (6/20): mean RA 7, PA 50/24 mean 34, mean PCWP 12, CI 2.01, PAPI 3.7, PVR 5.35 WU - RF negative, ANA negative, anti-SCL70 negative, anti-centromere negative.  - V/Q scan (8/20): No evidence for chronic PE.  - Echo (3/21): EF 60-65%, severe LVH, normal RV.  - Echo (6/22): EF 65-70% with mild LVH, normal RV, IVC normal, PASP estimated 29 mmHg. 7. CKD: Stage 3.   8. Wandering atrial pacemaker 9. OSA 10. PYP scan (3/21): H/CL ratio 1.0, grade 0.  11. CVA: MRI 3/21 with punctate occipital lobe infarct thought to be due to small vessel disease. - Carotid dopplers (3/21): 40-59% RICA stenosis.  - Carotid dopplers (4/22): 40-59% RICA stenosis.  12. Atrial fibrillation: Paroxysmal, noted on ILR.  13. GI bleeding 9/22 from gastric polyps.   Social History   Socioeconomic History   Marital status: Married    Spouse name: Not on file   Number of  children: 2   Years of education: Not on file   Highest education level: Not on file  Occupational History   Occupation: retired    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  Tobacco Use   Smoking status: Former    Packs/day: 1.00    Years: 35.00    Pack years: 35.00    Types: Pipe, Cigarettes    Quit date: 02/23/1992    Years since quitting: 28.7   Smokeless tobacco: Never   Tobacco comments:    quit 20 years ago  Vaping Use   Vaping Use: Never used  Substance and Sexual Activity   Alcohol use: Not Currently    Comment: 2 drinks daily scotch  AND WINE WITH SUPPER   Drug use: No   Sexual activity: Not on file  Other Topics Concern   Not on file  Social History Narrative   Married - second marriage   He has two children   Retired Engineer, 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, 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[facility]   Former Smoker quit 12 years ago- smoked for 35 years   Alcohol use- not currently   Right handed   Social Determinants of Radio broadcast assistant Strain: Not on file  Food Insecurity: Not on file  Transportation Needs: Not on file  Physical Activity: Not on file  Stress: Not on file  Social Connections: Not on file  Intimate Partner Violence: Not on file   Family History  Problem Relation Age of Onset   Heart disease Father        MI 13   Urticaria Father    Prostate cancer Brother    Prostate cancer Paternal Uncle    Prostate cancer Paternal Uncle    Prostate cancer Paternal Uncle    Hyperlipidemia Other    Stroke Other    Hypertension Other    ADD / ADHD Other    Colon cancer Neg Hx    Allergic rhinitis Neg Hx    Asthma Neg Hx    Eczema Neg Hx    ROS: All systems reviewed and negative except as per HPI.   Current Outpatient Medications  Medication Sig Dispense Refill   acetaminophen (TYLENOL) 500 MG tablet Take 500 mg by mouth every 6 (six) hours as needed for mild pain or headache.     apixaban (ELIQUIS) 2.5 MG TABS tablet Take  1 tablet (2.5 mg total) by mouth 2 (two) times daily. 60 tablet 11   atorvastatin (LIPITOR) 40 MG tablet Take 1 tablet (40 mg total) by mouth daily at 6 PM. 30 tablet 1   cyanocobalamin (,VITAMIN B-12,) 1000 MCG/ML injection Inject 1,000 mcg into the muscle every 30 (thirty) days.     escitalopram (LEXAPRO) 10 MG tablet Take 10 mg by mouth daily.     finasteride (PROSCAR) 5 MG tablet Take 5 mg by mouth daily.     FLONASE SENSIMIST 27.5 MCG/SPRAY nasal spray Place 1 spray into the nose daily as needed for rhinitis or allergies.      Melatonin 10 MG TABS Take 10 mg by mouth at bedtime.     metoprolol succinate (TOPROL XL) 25 MG 24 hr tablet Take 0.5 tablets (12.5 mg total) by mouth at bedtime. 15 tablet 3   NON FORMULARY Take 1 tablet by mouth See admin instructions. Unnamed laxative: Take 1 tablet by mouth at bedtime as needed for iron-induced constipation     pantoprazole (PROTONIX) 40 MG tablet Take 1 tablet (40 mg total) by mouth 2 (two) times daily. Take twice daily for 8 weeks and then change in dosing to once daily. 120 tablet 1   Riociguat (ADEMPAS) 2.5 MG TABS Take 2.5 mg by mouth 3 (three) times daily. 90 tablet 11   rOPINIRole (REQUIP) 2 MG tablet Take 2 mg by mouth See admin instructions. Take 2 mg by mouth three times a day and an additional 2 mg once a day as needed for restless legs     torsemide (DEMADEX) 100 MG tablet Take 1 tablet (100 mg total) by mouth 2 (two) times daily. 180 tablet 3   traMADol (ULTRAM) 50 MG tablet Take 50 mg by mouth every 6 (six) hours. As needed     traZODone (DESYREL) 100 MG tablet Take 100 mg by mouth at bedtime.     UPTRAVI 1600 MCG TABS Take 1,600 mcg by mouth 2 (two) times daily.     UPTRAVI 200 MCG TABS Take 400 mcg by mouth  2 (two) times daily.     No current facility-administered medications for this encounter.   BP 116/68   Pulse 88   Wt 82.1 kg (181 lb)   SpO2 100%   BMI 28.35 kg/m   Wt Readings from Last 3 Encounters:  11/19/20 82.1 kg  (181 lb)  11/18/20 80.3 kg (177 lb)  11/13/20 82.4 kg (181 lb 9.6 oz)   General:  NAD. No resp difficulty, arrived in [facility], no O2  HEENT: Normal Neck: Supple. No JVD. Carotids 2+ bilat; no bruits. No lymphadenopathy or thryomegaly appreciated. Cor: PMI nondisplaced. Regular rate & rhythm. No rubs, gallops, 2/6 SEM RUSB Lungs: Clear Abdomen: Soft, nontender, nondistended. No hepatosplenomegaly. No bruits or masses. Good bowel sounds. Extremities: No cyanosis, clubbing, rash, 2+ BLE/pedal edema (better today) Neuro: Alert & oriented x 3, cranial nerves grossly intact. Moves all 4 extremities w/o difficulty. Affect pleasant.  Assessment/Plan: 1.Chronic diastolic CHF: Suspect with significant component of RV failure from pulmonary hypertension.  Echo in 6/20 with EF 60-65%, RV not well-visualized but PASP 59 mmHg.  Echo (3/21) with EF 60-65%, severe LVH, normal RV, PASP 28 mmHg.  PYP scan was not suggestive of TTR cardiac amyloidosis. Echo in 6/22 showed EF 65-70% with mild LVH, normal RV, IVC normal, PASP estimated 29 mmHg.  Stable NYHA class III symptoms.  His LE edema looks better today, he still has volume. - Add back metolazone 2.5 mg every other Thursday w/ 40 mEq of KCl on metolazone days. BMET/BNP today, repeat BMET in 1 week. - Continue torsemide 100 mg bid. - If renal function remains stable, may be able to add back low-dose eplerenone. 2. CKD Stage 3: BMET today. 3. Pulmonary hypertension: Patient thought to have [place] out of proportion to his lung disease (ILD, NSIP versus UIP). RHC in 2/20 showed mean PA pressure 41, PVR 5.1 WU, CI 3. Echo in 6/20 with RV reportedly ok => I reviewed and do not think that RV was well-visualized.  Patient did not tolerate Adcirca due to edema/?cognitive effects.  RHC was done again in 6/20, showed moderate pulmonary arterial hypertension with PVR 5.35 (comparable to prior).  Serological workup for PH was negative. Patient started ambrisentan 5 mg and developed  significant edema/volume overload despite a high dose of torsemide. He is now on Uptravi + riociguat.  Most recent echo in 6/22 showed normal RV size/systolic function and PA systolic pressure estimate was normal.   - Unable to complete 6 minute walk today. - he will stay off ambrisentan (?trigger for volume overload).  - As above, he did not tolerate tadalafil.  - He is at 2000 mcg bid Uptravi.  With excellent echo in 6/22, will keep at this dose.   - Continue riociguat at goal dose 2.5 mg tid.  4. Atrial fibrillation: Paroxysmal, noted on ILR. Regular on exam today.  - Continue apixaban 2.5 mg bid (dosed for age, renal dysfunction). Denies abnormal bleeding. 5. OSA: Cannot tolerate CPAP.  6. CVA: In 3/21, may have been atrial fibrillation-related.  On apixaban now.  7. Carotid stenosis: 40-59% RICA stenosis on 4/22 dopplers.  - Repeat 4/23.    8. Interstitial lung disease:  Has seen pulmonary, ILD seems mild and stable.  9. Fe deficiency: After recent GI bleeding. He completed 1 infusion and felt poor, had reaction during 2nd infusion.  - Now s/p 1 U PRBC yesterday. Check CBC today.  Follow up with APP in 4-6 weeks and Dr. Aundra Dubin in 3 months.  Janett Billow  Egan FNP 11/19/2020

## 2020-11-19 ENCOUNTER — Ambulatory Visit (HOSPITAL_COMMUNITY)
Admission: RE | Admit: 2020-11-19 | Discharge: 2020-11-19 | Disposition: A | Discharge status: Home / Self Care | Payer: Medicare HMO | Source: Ambulatory Visit | Attending: Family Medicine | Admitting: Family Medicine

## 2020-11-19 ENCOUNTER — Encounter (HOSPITAL_COMMUNITY): Payer: Self-pay

## 2020-11-19 ENCOUNTER — Other Ambulatory Visit: Payer: Self-pay

## 2020-11-19 VITALS — BP 116/68 | HR 88 | Wt 181.0 lb

## 2020-11-19 DIAGNOSIS — J8410 Pulmonary fibrosis, unspecified: Secondary | ICD-10-CM | POA: Diagnosis not present

## 2020-11-19 DIAGNOSIS — M7989 Other specified soft tissue disorders: Secondary | ICD-10-CM | POA: Insufficient documentation

## 2020-11-19 DIAGNOSIS — N1831 Chronic kidney disease, stage 3a: Secondary | ICD-10-CM

## 2020-11-19 DIAGNOSIS — I480 Paroxysmal atrial fibrillation: Secondary | ICD-10-CM | POA: Diagnosis not present

## 2020-11-19 DIAGNOSIS — G4733 Obstructive sleep apnea (adult) (pediatric): Secondary | ICD-10-CM

## 2020-11-19 DIAGNOSIS — I272 Pulmonary hypertension, unspecified: Secondary | ICD-10-CM

## 2020-11-19 DIAGNOSIS — Z87891 Personal history of nicotine dependence: Secondary | ICD-10-CM | POA: Insufficient documentation

## 2020-11-19 DIAGNOSIS — I130 Hypertensive heart and chronic kidney disease with heart failure and stage 1 through stage 4 chronic kidney disease, or unspecified chronic kidney disease: Secondary | ICD-10-CM | POA: Insufficient documentation

## 2020-11-19 DIAGNOSIS — Z9981 Dependence on supplemental oxygen: Secondary | ICD-10-CM | POA: Insufficient documentation

## 2020-11-19 DIAGNOSIS — Z8673 Personal history of transient ischemic attack (TIA), and cerebral infarction without residual deficits: Secondary | ICD-10-CM

## 2020-11-19 DIAGNOSIS — I5032 Chronic diastolic (congestive) heart failure: Secondary | ICD-10-CM | POA: Diagnosis not present

## 2020-11-19 DIAGNOSIS — Z79899 Other long term (current) drug therapy: Secondary | ICD-10-CM | POA: Diagnosis not present

## 2020-11-19 DIAGNOSIS — Z09 Encounter for follow-up examination after completed treatment for conditions other than malignant neoplasm: Secondary | ICD-10-CM | POA: Insufficient documentation

## 2020-11-19 DIAGNOSIS — J849 Interstitial pulmonary disease, unspecified: Secondary | ICD-10-CM

## 2020-11-19 DIAGNOSIS — R5383 Other fatigue: Secondary | ICD-10-CM | POA: Insufficient documentation

## 2020-11-19 DIAGNOSIS — I2721 Secondary pulmonary arterial hypertension: Secondary | ICD-10-CM | POA: Insufficient documentation

## 2020-11-19 DIAGNOSIS — D509 Iron deficiency anemia, unspecified: Secondary | ICD-10-CM

## 2020-11-19 DIAGNOSIS — N183 Chronic kidney disease, stage 3 unspecified: Secondary | ICD-10-CM | POA: Diagnosis not present

## 2020-11-19 DIAGNOSIS — Z8249 Family history of ischemic heart disease and other diseases of the circulatory system: Secondary | ICD-10-CM | POA: Diagnosis not present

## 2020-11-19 DIAGNOSIS — Z7901 Long term (current) use of anticoagulants: Secondary | ICD-10-CM | POA: Insufficient documentation

## 2020-11-19 DIAGNOSIS — I6523 Occlusion and stenosis of bilateral carotid arteries: Secondary | ICD-10-CM

## 2020-11-19 LAB — TYPE AND SCREEN
ABO/RH(D): O POS
Antibody Screen: NEGATIVE
Unit division: 0

## 2020-11-19 LAB — CBC
HCT: 33 % — ABNORMAL LOW (ref 39.0–52.0)
Hemoglobin: 9.7 g/dL — ABNORMAL LOW (ref 13.0–17.0)
MCH: 25.4 pg — ABNORMAL LOW (ref 26.0–34.0)
MCHC: 29.4 g/dL — ABNORMAL LOW (ref 30.0–36.0)
MCV: 86.4 fL (ref 80.0–100.0)
Platelets: 256 10*3/uL (ref 150–400)
RBC: 3.82 MIL/uL — ABNORMAL LOW (ref 4.22–5.81)
RDW: 24.6 % — ABNORMAL HIGH (ref 11.5–15.5)
WBC: 6.8 10*3/uL (ref 4.0–10.5)
nRBC: 0 % (ref 0.0–0.2)

## 2020-11-19 LAB — CUP PACEART REMOTE DEVICE CHECK
Date Time Interrogation Session: 20221019143617
Implantable Pulse Generator Implant Date: 20210325

## 2020-11-19 LAB — BPAM RBC
Blood Product Expiration Date: 202211172359
ISSUE DATE / TIME: 202210181031
Unit Type and Rh: 5100

## 2020-11-19 LAB — BASIC METABOLIC PANEL
Anion gap: 7 (ref 5–15)
BUN: 65 mg/dL — ABNORMAL HIGH (ref 8–23)
CO2: 32 mmol/L (ref 22–32)
Calcium: 7.5 mg/dL — ABNORMAL LOW (ref 8.9–10.3)
Chloride: 100 mmol/L (ref 98–111)
Creatinine, Ser: 1.61 mg/dL — ABNORMAL HIGH (ref 0.61–1.24)
GFR, Estimated: 41 mL/min — ABNORMAL LOW (ref >60)
Glucose, Bld: 95 mg/dL (ref 70–99)
Potassium: 3.6 mmol/L (ref 3.5–5.1)
Sodium: 139 mmol/L (ref 135–145)

## 2020-11-19 LAB — BRAIN NATRIURETIC PEPTIDE: B Natriuretic Peptide: 165 pg/mL — ABNORMAL HIGH (ref 0.0–100.0)

## 2020-11-19 MED ORDER — METOLAZONE 2.5 MG PO TABS: 2.5000 mg | ORAL_TABLET | ORAL | 3 refills | Status: DC

## 2020-11-19 MED ORDER — POTASSIUM CHLORIDE CRYS ER 20 MEQ PO TBCR: 20.0000 meq | EXTENDED_RELEASE_TABLET | ORAL | 3 refills | Status: DC

## 2020-11-19 NOTE — Patient Instructions (Signed)
START Metolaozone 2.5 mg, one tab  Thursday 11/20/2020 and then every other Thursday thereafter  START Potassium 20 meq one tab weekly on Thursday  Labs today We will only contact you if something comes back abnormal or we need to make some changes. Otherwise no news is good news!  Labs needed in 10 days  Your physician recommends that you schedule a follow-up appointment in: 3 weeks  in the Advanced Practitioners (PA/NP) Clinic and in 3 months with Dr Aundra Dubin  Do the following things EVERYDAY: Weigh yourself in the morning before breakfast. Write it down and keep it in a log. Take your medicines as prescribed Eat low salt foods--Limit salt (sodium) to 2000 mg per day.  Stay as active as you can everyday Limit all fluids for the day to less than 2 liters  At the [facility], you and your health needs are our priority. As part of our continuing mission to provide you with exceptional heart care, we have created designated Provider Care Teams. These Care Teams include your primary Cardiologist (physician) and Advanced Practice Providers (APPs- Physician Assistants and Nurse Practitioners) who all work together to provide you with the care you need, when you need it.   You may see any of the following providers on your designated Care Team at your next follow up: Dr Glori Bickers Dr Loralie Champagne Dr Patrice Paradise, NP Lyda Jester, [place] Ginnie Smart Audry Riles, PharmD   Please be sure to bring in all your medications bottles to every appointment.   If you have any questions or concerns before your next appointment please send [place] a message through [place] or call our office at [phone].    TO LEAVE A MESSAGE FOR THE NURSE SELECT OPTION 2, PLEASE LEAVE A MESSAGE INCLUDING: YOUR NAME DATE OF BIRTH CALL BACK NUMBER REASON FOR CALL**this is important as we prioritize the call backs  YOU WILL RECEIVE A CALL BACK THE SAME DAY AS LONG AS YOU  CALL BEFORE 4:00 PM

## 2020-11-20 ENCOUNTER — Encounter (HOSPITAL_COMMUNITY): Payer: Self-pay

## 2020-11-24 ENCOUNTER — Encounter: Payer: Self-pay | Admitting: Internal Medicine

## 2020-11-24 ENCOUNTER — Other Ambulatory Visit (HOSPITAL_COMMUNITY): Payer: Medicare HMO

## 2020-11-26 NOTE — Progress Notes (Signed)
Carelink Summary Report / Loop Recorder

## 2020-11-28 ENCOUNTER — Other Ambulatory Visit: Payer: Self-pay | Admitting: Adult Health

## 2020-12-02 ENCOUNTER — Ambulatory Visit (HOSPITAL_COMMUNITY)
Admission: RE | Admit: 2020-12-02 | Discharge: 2020-12-02 | Disposition: A | Discharge status: Home / Self Care | Payer: Medicare HMO | Source: Ambulatory Visit | Attending: Cardiology | Admitting: Cardiology

## 2020-12-02 ENCOUNTER — Other Ambulatory Visit: Payer: Self-pay | Admitting: *Deleted

## 2020-12-02 ENCOUNTER — Other Ambulatory Visit: Payer: Self-pay

## 2020-12-02 DIAGNOSIS — I5032 Chronic diastolic (congestive) heart failure: Secondary | ICD-10-CM | POA: Diagnosis present

## 2020-12-02 LAB — BASIC METABOLIC PANEL
Anion gap: 10 (ref 5–15)
BUN: 69 mg/dL — ABNORMAL HIGH (ref 8–23)
CO2: 31 mmol/L (ref 22–32)
Calcium: 8.1 mg/dL — ABNORMAL LOW (ref 8.9–10.3)
Chloride: 97 mmol/L — ABNORMAL LOW (ref 98–111)
Creatinine, Ser: 1.72 mg/dL — ABNORMAL HIGH (ref 0.61–1.24)
GFR, Estimated: 38 mL/min — ABNORMAL LOW (ref >60)
Glucose, Bld: 82 mg/dL (ref 70–99)
Potassium: 4 mmol/L (ref 3.5–5.1)
Sodium: 138 mmol/L (ref 135–145)

## 2020-12-02 MED ORDER — ROPINIROLE HCL 2 MG PO TABS
2.0000 mg | ORAL_TABLET | Freq: Three times a day (TID) | ORAL | 4 refills | Status: DC
Start: 2020-12-02 — End: 2021-04-06

## 2020-12-12 ENCOUNTER — Telehealth: Payer: Self-pay

## 2020-12-12 NOTE — Telephone Encounter (Signed)
Spoke with patient regarding scheduling a Palliative Care consult. He declined services. He states he was not aware of the referral. Will cancel referral and notify referring provider.

## 2020-12-22 ENCOUNTER — Ambulatory Visit (INDEPENDENT_AMBULATORY_CARE_PROVIDER_SITE_OTHER): Payer: Medicare HMO

## 2020-12-22 DIAGNOSIS — I639 Cerebral infarction, unspecified: Secondary | ICD-10-CM | POA: Diagnosis not present

## 2020-12-22 LAB — CUP PACEART REMOTE DEVICE CHECK
Date Time Interrogation Session: 20221121081121
Implantable Pulse Generator Implant Date: 20210325

## 2020-12-23 ENCOUNTER — Ambulatory Visit (INDEPENDENT_AMBULATORY_CARE_PROVIDER_SITE_OTHER): Payer: Medicare HMO | Admitting: Internal Medicine

## 2020-12-23 ENCOUNTER — Encounter: Payer: Self-pay | Admitting: Internal Medicine

## 2020-12-23 VITALS — BP 100/60 | HR 88 | Ht 65.0 in | Wt 177.1 lb

## 2020-12-23 DIAGNOSIS — D509 Iron deficiency anemia, unspecified: Secondary | ICD-10-CM | POA: Diagnosis not present

## 2020-12-23 DIAGNOSIS — Z8719 Personal history of other diseases of the digestive system: Secondary | ICD-10-CM | POA: Diagnosis not present

## 2020-12-23 DIAGNOSIS — K449 Diaphragmatic hernia without obstruction or gangrene: Secondary | ICD-10-CM

## 2020-12-23 DIAGNOSIS — K2270 Barrett's esophagus without dysplasia: Secondary | ICD-10-CM

## 2020-12-23 DIAGNOSIS — R198 Other specified symptoms and signs involving the digestive system and abdomen: Secondary | ICD-10-CM

## 2020-12-23 NOTE — Progress Notes (Signed)
Chief Complaint: Anemia  HPI : 87 year old male with history of HFpEF, CVA, A-fib on Eliquis, CKD, ILD on 2 L [place] oxygen presented with anemia  Patient was recently hospitalized 9/16 to 10/21/2020 for melena and anemia.  He had an EGD at that time that showed several inflammatory polyps with stigmata of recent bleeding.  These inflammatory polyps were removed, and pathology results came back as hyperplastic polyps.  His hemoglobin stabilized.  Had H pylori antibody checked at that time that was negative.  He has been having issues with alternating constipaton and diarrhea. He has been taking Miralax, which has helped with his symptoms. He is on Eliquis. Denies melena or hematochezia.  Patient has been trying to increase his protein intake, primarily through drinking protein shakes.  He had 2 iron infusions to boost his iron levels, but developed an allergic reaction to his second infusion.  He has not been on any oral iron supplementation.   Past Medical History:  Diagnosis Date   Asthma    Barrett's esophagus    Benign prostatic hypertrophy    Bilateral lower extremity edema    CHF (congestive heart failure) (HCC)    Chronic fatigue    COPD (chronic obstructive pulmonary disease) (HCC)    Diabetes mellitus type 2 in nonobese ([place]) 04/24/2019   Diverticulosis of colon (without mention of hemorrhage)    Esophageal reflux    Gastritis    Gluten intolerance    Hiatal hernia 02/10/2011   Noted on CT Scan - Moderate Hiatal Hernia   Hyperlipemia    Hypertension    Lymphoma of lymph nodes in pelvis ([place]) 03/03/2011    Large Right Retroperitoneal Mass   Melanoma ([place])    lymphoma   Microcytic anemia 04/20/2011   NHL (non-Hodgkin's 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 Collene Mares   Pulmonary hyperinflation     Renal cyst 02/10/2011    Noted on CT Scan - Bilateral Renal Cysts   S/P radiation therapy 03/15/11 - 04/09/11   Abdominal/ Pelvic Tumor, 3600 cGy/20 Fractions     Past Surgical History:  Procedure Laterality Date   ARTHROSCOPIC REPAIR ACL     right   BONE MARROW ASPIRATION  02/25/11   Bone Marrow, Aspirate, Clot, and Bilateral Bx, Right PIC   CATARACT EXTRACTION, BILATERAL     ESOPHAGOGASTRODUODENOSCOPY (EGD) WITH PROPOFOL N/A 10/19/2020   Procedure: ESOPHAGOGASTRODUODENOSCOPY (EGD) WITH PROPOFOL;  Surgeon: Sharyn Creamer, MD;  Location: [facility];  Service: Gastroenterology;  Laterality: N/A;   EYE SURGERY     HEMOSTASIS CLIP PLACEMENT  10/19/2020   Procedure: HEMOSTASIS CLIP PLACEMENT;  Surgeon: Sharyn Creamer, MD;  Location[facility] ENDOSCOPY;  Service: Gastroenterology;;   HERNIA REPAIR     LEFT INGUINAL    INSERTION OF MESH N/A 08/23/2012   Procedure: INSERTION OF MESH;  Surgeon: Madilyn Hook, DO;  Location: [facility];  Service: General;  Laterality: N/A;   LOOP RECORDER INSERTION N/A 04/26/2019   Procedure: LOOP RECORDER INSERTION;  Surgeon: Evans Lance, MD;  Location: [facility];  Service: Cardiovascular;  Laterality: N/A;   LUMBAR LAMINECTOMY/DECOMPRESSION MICRODISCECTOMY Right 12/31/2013   Procedure: RIGHT L4-5 L5-S1 LAMINECTOMY;  Surgeon: Kristeen Miss, MD;  Location: [facility];  Service: Neurosurgery;  Laterality: Right;  RIGHT L4-5 L5-S1 LAMINECTOMY  POLYPECTOMY  10/19/2020   Procedure: POLYPECTOMY;  Surgeon: Sharyn Creamer, MD;  Location: [facility] ENDOSCOPY;  Service: Gastroenterology;;   RIGHT HEART CATH N/A 07/25/2018   Procedure: RIGHT HEART CATH;  Surgeon: Larey Dresser, MD;  Location: [facility];  Service: Cardiovascular;  Laterality: N/A;   ROTATOR CUFF REPAIR     right   TONSILLECTOMY     TONSILLECTOMY     VENTRAL HERNIA REPAIR N/A 08/23/2012   Procedure: HERNIA REPAIR VENTRAL ADULT;  Surgeon: Madilyn Hook, DO;  Location: [facility];  Service: General;   Laterality: N/A;   Family History  Problem Relation Age of Onset   Heart disease Father        MI 28   Urticaria Father    Prostate cancer Brother    Prostate cancer Paternal Uncle    Prostate cancer Paternal Uncle    Prostate cancer Paternal Uncle    Hyperlipidemia Other    Stroke Other    Hypertension Other    ADD / ADHD Other    Colon cancer Neg Hx    Allergic rhinitis Neg Hx    Asthma Neg Hx    Eczema Neg Hx    Social History   Tobacco Use   Smoking status: Former    Packs/day: 1.00    Years: 35.00    Pack years: 35.00    Types: Pipe, Cigarettes    Quit date: 02/23/1992    Years since quitting: 28.8   Smokeless tobacco: Never   Tobacco comments:    quit 20 years ago  Vaping Use   Vaping Use: Never used  Substance Use Topics   Alcohol use: Not Currently    Comment: 2 drinks daily scotch  AND WINE WITH SUPPER   Drug use: No   Current Outpatient Medications  Medication Sig Dispense Refill   acetaminophen (TYLENOL) 500 MG tablet Take 500 mg by mouth every 6 (six) hours as needed for mild pain or headache.     apixaban (ELIQUIS) 2.5 MG TABS tablet Take 1 tablet (2.5 mg total) by mouth 2 (two) times daily. 60 tablet 11   atorvastatin (LIPITOR) 40 MG tablet Take 1 tablet (40 mg total) by mouth daily at 6 PM. 30 tablet 1   cyanocobalamin (,VITAMIN B-12,) 1000 MCG/ML injection Inject 1,000 mcg into the muscle every 30 (thirty) days.     escitalopram (LEXAPRO) 10 MG tablet Take 10 mg by mouth daily.     finasteride (PROSCAR) 5 MG tablet Take 5 mg by mouth daily.     FLONASE SENSIMIST 27.5 MCG/SPRAY nasal spray Place 1 spray into the nose daily as needed for rhinitis or allergies.      Melatonin 10 MG TABS Take 10 mg by mouth at bedtime.     metolazone (ZAROXOLYN) 2.5 MG tablet Take 1 tablet (2.5 mg total) by mouth once a week. 5 tablet 3   metoprolol succinate (TOPROL XL) 25 MG 24 hr tablet Take 0.5 tablets (12.5 mg total) by mouth at bedtime. 15 tablet 3   NON FORMULARY  Take 1 tablet by mouth See admin instructions. Unnamed laxative: Take 1 tablet by mouth at bedtime as needed for iron-induced constipation     pantoprazole (PROTONIX) 40 MG tablet Take 1 tablet (40 mg total) by mouth 2 (two) times daily. Take twice daily for 8 weeks and then change in dosing to once daily. 120 tablet 1   potassium chloride SA (KLOR-CON) 20 MEQ tablet Take 1 tablet (20 mEq total)  by mouth once a week. 5 tablet 3   Riociguat (ADEMPAS) 2.5 MG TABS Take 2.5 mg by mouth 3 (three) times daily. 90 tablet 11   rOPINIRole (REQUIP) 2 MG tablet Take 1 tablet (2 mg total) by mouth 3 (three) times daily. 90 tablet 4   torsemide (DEMADEX) 100 MG tablet Take 1 tablet (100 mg total) by mouth 2 (two) times daily. 180 tablet 3   traMADol (ULTRAM) 50 MG tablet Take 50 mg by mouth every 6 (six) hours. As needed     traZODone (DESYREL) 100 MG tablet Take 100 mg by mouth at bedtime.     UPTRAVI 1600 MCG TABS Take 1,600 mcg by mouth 2 (two) times daily.     UPTRAVI 200 MCG TABS Take 400 mcg by mouth 2 (two) times daily.     No current facility-administered medications for this visit.   Allergies  Allergen Reactions   Pollen Extract-Tree Extract Other (See Comments)    "HEADACHES, TIRED, DRAINAGE FROM SINUSES"   Feraheme [Ferumoxytol] Other (See Comments)    Chest pain, facial flushing, shortness of breath   Molds & Smuts Other (See Comments)    Also dust mites causes sinus infections, h/a etc.     Review of Systems: All systems reviewed and negative except where noted in HPI.   Physical Exam: Ht _0  (1.651 m)   Wt 177 lb 2 oz (80.3 kg)   BMI 29.48 kg/m  Constitutional: Pleasant,well-developed, male in no acute distress. HEENT: Normocephalic and atraumatic. Conjunctivae are normal. No scleral icterus. Cardiovascular: Normal rate, regular rhythm.  Pulmonary/chest: Effort normal and breath sounds normal. No wheezing, rales or rhonchi. Abdominal: Soft, nondistended, nontender. Bowel  sounds active throughout. There are no masses palpable. No hepatomegaly. Extremities: 2+ BLE edema Neurological: Alert and oriented to person place and time. Skin: Skin is warm and dry. No rashes noted. Psychiatric: Normal mood and affect. Behavior is normal.  Labs 10/2020 - Negative H pylori antibody  Chest CT 01/16/20: IMPRESSION: 1. Very subtle changes in the lungs which could be indicative of very early or mild interstitial lung disease, with a spectrum of findings considered indeterminate for usual interstitial pneumonia (UIP) per current ATS guidelines. If there is persistent clinical concern for interstitial lung disease, repeat high-resolution chest CT would be recommended in 12 months to assess for temporal changes in the appearance of the lung parenchyma. 2. Aortic atherosclerosis, in addition to left main and 3 vessel coronary artery disease. 3. There are calcifications of the aortic valve and mitral annulus. Echocardiographic correlation for evaluation of potential valvular dysfunction may be warranted if clinically indicated. 4. Large hiatal hernia with predominantly intrathoracic stomach in the lower left hemithorax with associated areas of atelectasis in the left lung base.  EGD 10/19/20: - Esophageal mucosal changes classified as Barrett's stage C1-M3 per [place] criteria. - Large hiatal hernia. - Multiple gastric polyps. 3 polyps with stigmata of recent bleeding resected and retrieved. Clips were placed. - Normal examined duodenum. Path: A. STOMACH, POLYPECTOMY:  - Hyperplastic polyp(s).  - No intestinal metaplasia, dysplasia, or malignancy.   ASSESSMENT AND PLAN: IDA History of hyperplastic gastric polyps Barrett's esophagus Hiatal hernia Alternating constipation and diarrhea Patient presents for hospital follow-up after recent admission for upper GI bleed attributed to inflammatory gastric polyps.  His hemoglobin has remained stable after he was discharged  from the hospital.  Patient has not noted any further episodes of GI bleeding.  I recommended that we recheck his blood counts and see  what his iron levels are currently.  His wife stated that they would prefer to wait until he has his cardiac follow-up in 1 week when he will get labs drawn anyways.  I told them that this would be fine.  I went ahead and recommended that he be started on oral iron supplementation since he was not able to tolerate IV iron infusions in the past.  If he is hemoglobin does not respond appropriately to oral iron, he may benefit from a different formulation of IV iron in the future.  Although patient has history of Barrett's esophagus and hyperplastic polyps (which can be precancerous), I do not feel that repeat endoscopy for surveillance of these lesions would be beneficial to the patient based upon his age and comorbidities. - Continue PPI therapy - Will get CBC, ferritin, iron/TIBC checked with his cardiologist - Start PO iron supplement - RTC 2 months to check in (per patient's wife's request)  Christia Reading, MD  I spent 61 minutes of time, including in depth chart review, independent review of results as outlined above, communicating results with the patient directly, face-to-face time with the patient, coordinating care, ordering studies and medications as appropriate, and documentation.

## 2020-12-23 NOTE — Patient Instructions (Signed)
START daily Iron supplement 325 mg daily  If you are age 85 or older, your body mass index should be between 23-30. Your Body mass index is 29.48 kg/m. If this is out of the aforementioned range listed, please consider follow up with your Primary Care Provider.  If you are age 53 or younger, your body mass index should be between 19-25. Your Body mass index is 29.48 kg/m. If this is out of the aformentioned range listed, please consider follow up with your Primary Care Provider.   ________________________________________________________  The [facility] providers would like to encourage you to use [facility] to communicate with providers for non-urgent requests or questions.  Due to long hold times on the telephone, sending your provider a message by [facility] may be a faster and more efficient way to get a response.  Please allow 48 business hours for a response.  Please remember that this is for non-urgent requests.  _______________________________________________________   I appreciate the  opportunity to care for you  Thank You   Dayna Barker,, MD

## 2020-12-30 NOTE — Progress Notes (Signed)
Carelink Summary Report / Loop Recorder

## 2020-12-31 ENCOUNTER — Encounter (HOSPITAL_COMMUNITY): Payer: Self-pay

## 2020-12-31 ENCOUNTER — Ambulatory Visit (HOSPITAL_COMMUNITY)
Admission: RE | Admit: 2020-12-31 | Discharge: 2020-12-31 | Disposition: A | Discharge status: Home / Self Care | Payer: Medicare HMO | Source: Ambulatory Visit | Attending: Family Medicine | Admitting: Family Medicine

## 2020-12-31 VITALS — BP 102/56 | HR 93 | Wt 182.0 lb

## 2020-12-31 DIAGNOSIS — N1831 Chronic kidney disease, stage 3a: Secondary | ICD-10-CM | POA: Diagnosis not present

## 2020-12-31 DIAGNOSIS — I2721 Secondary pulmonary arterial hypertension: Secondary | ICD-10-CM | POA: Insufficient documentation

## 2020-12-31 DIAGNOSIS — Z87891 Personal history of nicotine dependence: Secondary | ICD-10-CM | POA: Diagnosis not present

## 2020-12-31 DIAGNOSIS — G4733 Obstructive sleep apnea (adult) (pediatric): Secondary | ICD-10-CM | POA: Diagnosis not present

## 2020-12-31 DIAGNOSIS — Z79899 Other long term (current) drug therapy: Secondary | ICD-10-CM | POA: Insufficient documentation

## 2020-12-31 DIAGNOSIS — I272 Pulmonary hypertension, unspecified: Secondary | ICD-10-CM

## 2020-12-31 DIAGNOSIS — N183 Chronic kidney disease, stage 3 unspecified: Secondary | ICD-10-CM | POA: Diagnosis not present

## 2020-12-31 DIAGNOSIS — I130 Hypertensive heart and chronic kidney disease with heart failure and stage 1 through stage 4 chronic kidney disease, or unspecified chronic kidney disease: Secondary | ICD-10-CM | POA: Diagnosis not present

## 2020-12-31 DIAGNOSIS — Z7901 Long term (current) use of anticoagulants: Secondary | ICD-10-CM | POA: Diagnosis not present

## 2020-12-31 DIAGNOSIS — D509 Iron deficiency anemia, unspecified: Secondary | ICD-10-CM

## 2020-12-31 DIAGNOSIS — J849 Interstitial pulmonary disease, unspecified: Secondary | ICD-10-CM | POA: Diagnosis not present

## 2020-12-31 DIAGNOSIS — Z9981 Dependence on supplemental oxygen: Secondary | ICD-10-CM | POA: Diagnosis not present

## 2020-12-31 DIAGNOSIS — G2581 Restless legs syndrome: Secondary | ICD-10-CM | POA: Insufficient documentation

## 2020-12-31 DIAGNOSIS — Z8673 Personal history of transient ischemic attack (TIA), and cerebral infarction without residual deficits: Secondary | ICD-10-CM | POA: Diagnosis not present

## 2020-12-31 DIAGNOSIS — I480 Paroxysmal atrial fibrillation: Secondary | ICD-10-CM | POA: Insufficient documentation

## 2020-12-31 DIAGNOSIS — I6521 Occlusion and stenosis of right carotid artery: Secondary | ICD-10-CM | POA: Diagnosis not present

## 2020-12-31 DIAGNOSIS — I6523 Occlusion and stenosis of bilateral carotid arteries: Secondary | ICD-10-CM

## 2020-12-31 DIAGNOSIS — Z8572 Personal history of non-Hodgkin lymphomas: Secondary | ICD-10-CM | POA: Diagnosis not present

## 2020-12-31 DIAGNOSIS — I5032 Chronic diastolic (congestive) heart failure: Secondary | ICD-10-CM | POA: Insufficient documentation

## 2020-12-31 LAB — CBC
HCT: 34.6 % — ABNORMAL LOW (ref 39.0–52.0)
Hemoglobin: 10.5 g/dL — ABNORMAL LOW (ref 13.0–17.0)
MCH: 26.9 pg (ref 26.0–34.0)
MCHC: 30.3 g/dL (ref 30.0–36.0)
MCV: 88.7 fL (ref 80.0–100.0)
Platelets: 290 10*3/uL (ref 150–400)
RBC: 3.9 MIL/uL — ABNORMAL LOW (ref 4.22–5.81)
RDW: 19.4 % — ABNORMAL HIGH (ref 11.5–15.5)
WBC: 8.3 10*3/uL (ref 4.0–10.5)
nRBC: 0 % (ref 0.0–0.2)

## 2020-12-31 LAB — BASIC METABOLIC PANEL
Anion gap: 10 (ref 5–15)
BUN: 86 mg/dL — ABNORMAL HIGH (ref 8–23)
CO2: 32 mmol/L (ref 22–32)
Calcium: 8.1 mg/dL — ABNORMAL LOW (ref 8.9–10.3)
Chloride: 97 mmol/L — ABNORMAL LOW (ref 98–111)
Creatinine, Ser: 1.51 mg/dL — ABNORMAL HIGH (ref 0.61–1.24)
GFR, Estimated: 44 mL/min — ABNORMAL LOW (ref >60)
Glucose, Bld: 92 mg/dL (ref 70–99)
Potassium: 3.8 mmol/L (ref 3.5–5.1)
Sodium: 139 mmol/L (ref 135–145)

## 2020-12-31 LAB — IRON AND TIBC
Iron: 26 ug/dL — ABNORMAL LOW (ref 45–182)
Saturation Ratios: 5 % — ABNORMAL LOW (ref 17.9–39.5)
TIBC: 489 ug/dL — ABNORMAL HIGH (ref 250–450)
UIBC: 463 ug/dL

## 2020-12-31 LAB — FERRITIN: Ferritin: 12 ng/mL — ABNORMAL LOW (ref 24–336)

## 2020-12-31 MED ORDER — IRON (FERROUS SULFATE) 325 (65 FE) MG PO TABS: 325.0000 mg | ORAL_TABLET | Freq: Every day | ORAL | 4 refills | Status: DC

## 2020-12-31 MED ORDER — METOLAZONE 2.5 MG PO TABS: 2.5000 mg | ORAL_TABLET | ORAL | 4 refills | Status: DC

## 2020-12-31 NOTE — Patient Instructions (Addendum)
Thank you for coming in  Labs were done today, if any labs are abnormal the clinic will call you  START Iron 325 mg 1 tablet daily   TAKE Metolazone 2.5 mg 1 tablet every week on Thursday starting 01/01/2021 take 20 meq of potassium with each dose of metolazone  Your physician recommends that you schedule a follow-up appointment in: keep scheduled appointment with Dr. Aundra Dubin  Your physician recommends that you return for lab work in: 7-10 days  At the [facility], you and your health needs are our priority. As part of our continuing mission to provide you with exceptional heart care, we have created designated Provider Care Teams. These Care Teams include your primary Cardiologist (physician) and Advanced Practice Providers (APPs- Physician Assistants and Nurse Practitioners) who all work together to provide you with the care you need, when you need it.   You may see any of the following providers on your designated Care Team at your next follow up: Dr Glori Bickers Dr Haynes Kerns, NP Lyda Jester, [place] [facility] [place], [place] Audry Riles, PharmD   Please be sure to bring in all your medications bottles to every appointment.   If you have any questions or concerns before your next appointment please send [place] a message through [place] or call our office at [phone].    TO LEAVE A MESSAGE FOR THE NURSE SELECT OPTION 2, PLEASE LEAVE A MESSAGE INCLUDING: YOUR NAME DATE OF BIRTH CALL BACK NUMBER REASON FOR CALL**this is important as we prioritize the call backs  YOU WILL RECEIVE A CALL BACK THE SAME DAY AS LONG AS YOU CALL BEFORE 4:00 PM

## 2020-12-31 NOTE — Progress Notes (Addendum)
PCP: Roetta Sessions, NP Cardiology: Dr. Aundra Dubin  87 y.o. with history of interstitial lung disease (?NSIP vs UIP) on home oxygen, pulmonary hypertension, diastolic CHF, and prior non-Hodgkins lymphoma (pelvic mass, s/p XRT) presents for followup of CHF and pulmonary hypertension.  Patient has history of reportedly mild ILD (NSIP vs UIP) followed by a pulmonologist at [facility] and pulmonary hypertension thought to be out of proportion lung disease followed by a physician at [facility].  Last CT chest was in 11/19, showing stable ILD (?UIP vs NSIP).  He had RHC in 2/20 with moderate PAH, PVR 5.1 WU.  He was tried on Adcirca but developed cognitive problems and worsening of peripheral edema so this was stopped.  He has been on home oxygen, uses at night and with exertion.    He was admitted in 6/20 with worsening dyspnea and peripheral edema, weight up despite compliance with torsemide 40 mg bid at home.  He has CKD stage 3 and torsemide was decreased in the spring. He was diuresed aggressively with fall in weight.  Echo showed normal LV size and systolic function, RV reportedly normal.  6/20 RHC showed normal filling pressures with moderate pulmonary hypertension, PVR 5.35 WU.   After discharge from hospital, patient started on ambrisentan.  His weight steadily began to increase and he developed worsening lower extremity edema, we stopped ambrisentan.   Patient was admitted in 8/20 with AKI after taking metolazone for several days.  He was admitted again in 8/20 with acute on chronic diastolic CHF. He was diuresed.  Wandering atrial pacemaker noted.   He was admitted in 3/21 with suspected CVA, MRI head showed punctate occipital lobe infarct, thought to be due to small vessel disease. ILR was implanted.  Echo in 3/21 showed EF 60-65%, severe LVH, normal RV.  PYP scan in 3/21 was not suggestive of TTR amyloidosis.  Subsequently, ILR has shown paroxysmal atrial fibrillation.   He was admitted later in  3/21 with AKI, creatinine up to 5.  However, he corrected rapidly to baseline with IVF and creatinine was 1.02 when discharge.   Echo in 6/22 showed EF 65-70% with mild LVH, normal RV, IVC normal, PASP estimated 29 mmHg.   Patient was admitted in 9/22 with upper GI bleed from bleeding gastric polyps.  He had polypectomy and has been restarted on Eliquis.   He was seen in the office 10/23/20 and BMET showed creatinine up to 4.18.  He was also noted to be Fe deficient.  Torsemide, metolazone, and spironolactone were stopped. Follow up he felt better. Planned to resume torsemide in a couple days, continued to hold spiro and metolazone. Feraheme infusions arranged.  Today he returns for HF follow up with his wife. He remains weak and legs are swelling more. He feels especially weak and fatigued after he takes his metolazone; weight usually drops by 5 lbs after metolazone. Seen by GI and instructed to start po iron, has not done this yet. He is sleeping a lot. He has been off his oxygen, getting around OK with rolling walker without significant dyspnea. He spot checks his O2 saturation and it is occasionally 87%, but will rapidly improve to 90's. Denies CP, abnormal bleeding, palpitations, dizziness, or PND/Orthopnea. Appetite ok. No fever or chills. Weight stable. Taking all medications. Restless leg symptoms have improved.  6 minute walk (8/21): 122 m 6 minute walk (11/21): 244 m 6 minute walk (3/22): 122 m  Labs (6/20): K 4.7, creatinine 1.46 Labs (7/20): K 3.1, creatinine  1.67 Labs (8/20): K 3.9, creatinine 1.8 Labs (9/20): K 4.9, creatinine 1.76, BNP 78 Labs (10/20): K 4.2, creatininine 1.5 Labs (11/20): BNP 36, K 4.3, creatinine 1.4 Labs (3/21): myeloma panel negative, urine immunofixation negative.  Labs (4/21): K 4.5, creatinine 1.02, hgb 11.4 Labs (6/21): K 5.1, creatinine 2.2, hgb 10.4 Labs (8/21): LDL 82 Labs (11/21): K 5.3, creatinine 2.11 Labs (3/22): K 3.9, creatinine 2.31 Labs  (4/22): K 4.1, creatinine 2.15 Labs (9/22): hgb 8.6 => 9.1, creatinine 1.5 => 4.18, BUN 85, BNP 38.5 Labs (11/22): K 4.0, creatinine 1.7  PMH: 1. Non-Hodgkin's lymphoma: Pelvic involvement.  S/p radiation.  Thought to be in remission (Dr. Benay Spice).  2. HTN 3. PACs/PVCs 4. GERD: Barrett's esophagus.  5. 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  - High resolution CT chest 11/19: ILD, ?UIP pattern.  6. Pulmonary hypertension: Thought to be out of proportion to underlying lung disease (ILD, ?NSIP vs UIP).   - RHC (2/20): mean RA 10, PASP 60 with mean 42, mean PCWP 11, CI 3, PVR 5.1 WU.  - Trial of Adcirca => failed due to ?memory worsening/cognitive affects and peripheral edema.  - Echo (6/20): EF 60-65%, mild LVH, RV reportedly normal, PASP 59 mmHg.  - RHC (6/20): mean RA 7, PA 50/24 mean 34, mean PCWP 12, CI 2.01, PAPI 3.7, PVR 5.35 WU - RF negative, ANA negative, anti-SCL70 negative, anti-centromere negative.  - V/Q scan (8/20): No evidence for chronic PE.  - Echo (3/21): EF 60-65%, severe LVH, normal RV.  - Echo (6/22): EF 65-70% with mild LVH, normal RV, IVC normal, PASP estimated 29 mmHg. 7. CKD: Stage 3.   8. Wandering atrial pacemaker 9. OSA 10. PYP scan (3/21): H/CL ratio 1.0, grade 0.  11. CVA: MRI 3/21 with punctate occipital lobe infarct thought to be due to small vessel disease. - Carotid dopplers (3/21): 40-59% RICA stenosis.  - Carotid dopplers (4/22): 40-59% RICA stenosis.  12. Atrial fibrillation: Paroxysmal, noted on ILR.  13. GI bleeding 9/22 from gastric polyps.   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  Tobacco Use   Smoking status: Former    Packs/day: 1.00    Years: 35.00    Pack years: 35.00    Types: Pipe, Cigarettes    Quit  date: 02/23/1992    Years since quitting: 28.8   Smokeless tobacco: Never   Tobacco comments:    quit 20 years ago  Vaping Use   Vaping Use: Never used  Substance and Sexual Activity   Alcohol use: Not Currently    Comment: 2 drinks daily scotch  AND WINE WITH SUPPER   Drug use: No   Sexual activity: Not on file  Other Topics Concern   Not on file  Social History Narrative   Married - second marriage   He has two children   Retired Horticulturist, commercial Professor   Currently teaches pottery making, 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[place] [facility]   Former Smoker quit 12 years ago- smoked for 35 years   Alcohol use- not currently   Right handed   Social Determinants of Radio broadcast assistant Strain: Not on file  Food Insecurity: Not on file  Transportation Needs: Not on file  Physical Activity: Not on file  Stress: Not on file  Social Connections: Not on file  Intimate Partner Violence: Not on file   Family History  Problem Relation Age of Onset   Dementia Mother    Heart disease Father        MI 41   Urticaria Father    Dementia Sister    Parkinson's disease Sister    ALS Sister    Prostate cancer Brother    Prostate cancer Paternal Uncle    Prostate cancer Paternal Uncle    Prostate cancer Paternal Uncle    Hyperlipidemia Other    Stroke Other    Hypertension Other    ADD / ADHD Other    Colon cancer Neg Hx    Allergic rhinitis Neg Hx    Asthma Neg Hx    Eczema Neg Hx    ROS: All systems reviewed and negative except as per HPI.   Current Outpatient Medications  Medication Sig Dispense Refill   acetaminophen (TYLENOL) 500 MG tablet Take 500 mg by mouth every 6 (six) hours as needed for mild pain or headache.     apixaban (ELIQUIS) 2.5 MG TABS tablet Take 1 tablet (2.5 mg total) by mouth 2 (two) times daily. 60 tablet 11   atorvastatin (LIPITOR) 40 MG tablet Take 1 tablet (40 mg total) by mouth daily at 6 PM. 30 tablet 1   cyanocobalamin (,VITAMIN B-12,) 1000 MCG/ML injection  Inject 1,000 mcg into the muscle every 30 (thirty) days.     escitalopram (LEXAPRO) 10 MG tablet Take 10 mg by mouth daily.     finasteride (PROSCAR) 5 MG tablet Take 5 mg by mouth daily.     FLONASE SENSIMIST 27.5 MCG/SPRAY nasal spray Place 1 spray into the nose daily as needed for rhinitis or allergies.      Melatonin 10 MG TABS Take 10 mg by mouth at bedtime.     metolazone (ZAROXOLYN) 2.5 MG tablet Take 2.5 mg by mouth. Every other week     metoprolol succinate (TOPROL XL) 25 MG 24 hr tablet Take 0.5 tablets (12.5 mg total) by mouth at bedtime. 15 tablet 3   NON FORMULARY Take 1 tablet by mouth See admin instructions. Unnamed laxative: Take 1 tablet by mouth at bedtime as needed for iron-induced constipation     OXYGEN Inhale 2 L/min into the lungs. As needed and at night     pantoprazole (PROTONIX) 40 MG tablet Take 1 tablet (40 mg total) by mouth 2 (two) times daily. Take twice daily for 8 weeks and then change in dosing to once daily. 120 tablet 1   potassium chloride SA (KLOR-CON) 20 MEQ tablet Take 1 tablet (20 mEq total) by mouth once a week. (Patient taking differently: Take 20 mEq by mouth every 14 (fourteen) days.) 5 tablet 3   Riociguat (ADEMPAS) 2.5 MG TABS Take 2.5 mg by mouth 3 (three) times daily. 90 tablet 11   rOPINIRole (REQUIP) 2 MG tablet Take 1 tablet (2 mg total) by mouth 3 (three) times daily. 90 tablet 4   torsemide (DEMADEX) 100 MG tablet Take 1 tablet (100 mg total) by mouth 2 (two) times daily. 180 tablet 3   traMADol (ULTRAM) 50 MG tablet Take 50 mg by mouth every 6 (six) hours. As needed     traZODone (DESYREL) 100 MG tablet Take 100 mg by mouth at bedtime.     UPTRAVI 1600 MCG TABS Take 1,600 mcg by mouth 2 (two) times daily.  UPTRAVI 200 MCG TABS Take 400 mcg by mouth 2 (two) times daily.     No current facility-administered medications for this encounter.   BP (!) 102/56   Pulse 93   Wt 82.6 kg (182 lb)   SpO2 93%   BMI 30.29 kg/m   Wt Readings from  Last 3 Encounters:  12/31/20 82.6 kg (182 lb)  12/23/20 80.3 kg (177 lb 2 oz)  11/19/20 82.1 kg (181 lb)   General:  NAD. No resp difficulty, elderly, walked into clinic with rolling walker, off oxygen. HEENT: Bilateral hearing aids. Neck: Supple. No JVD. Carotids 2+ bilat; no bruits. No lymphadenopathy or thryomegaly appreciated. Cor: PMI nondisplaced. Regular rate & rhythm. No rubs, gallops, 2/6 SEM RUSB Lungs: Clear Abdomen: Soft, nontender, nondistended. No hepatosplenomegaly. No bruits or masses. Good bowel sounds. Extremities: No cyanosis, clubbing, rash, 2+ BLE edema to knees Neuro: Alert & oriented x 3, cranial nerves grossly intact. Moves all 4 extremities w/o difficulty. Affect pleasant.  Assessment/Plan: 1.Chronic diastolic CHF: Suspect with significant component of RV failure from pulmonary hypertension.  Echo in 6/20 with EF 60-65%, RV not well-visualized but PASP 59 mmHg.  Echo (3/21) with EF 60-65%, severe LVH, normal RV, PASP 28 mmHg.  PYP scan was not suggestive of TTR cardiac amyloidosis. Echo in 6/22 showed EF 65-70% with mild LVH, normal RV, IVC normal, PASP estimated 29 mmHg.  Stable NYHA class III symptoms. His breathing is about the same but his LE edema looks worse today. Long discussion today about need for compression with UNNA boots and/or TED hose. He continues to decline both of these due to his RLS. - Increase metolazone 2.5 mg to weekly (previously on every other week). Take 20 KCL with each metolazone dose. BMET today, repeat in 7-10 days. Will see if renal function allows weekly metolazone use. - Continue torsemide 100 mg bid. - Elevate legs when at rest. - If renal function remains stable, may be able to add back low-dose eplerenone. No BP room today to add. 2. CKD Stage 3: BMET today. 3. Pulmonary hypertension: Patient thought to have [place] out of proportion to his lung disease (ILD, NSIP versus UIP). RHC in 2/20 showed mean PA pressure 41, PVR 5.1 WU, CI 3. Echo  in 6/20 with RV reportedly ok => I reviewed and do not think that RV was well-visualized.  Patient did not tolerate Adcirca due to edema/?cognitive effects.  RHC was done again in 6/20, showed moderate pulmonary arterial hypertension with PVR 5.35 (comparable to prior).  Serological workup for PH was negative. Patient started ambrisentan 5 mg and developed significant edema/volume overload despite a high dose of torsemide. He is now on Uptravi + riociguat.  Most recent echo in 6/22 showed normal RV size/systolic function and PA systolic pressure estimate was normal.   - Unable to complete 6 minute walk today. - he will stay off ambrisentan (? trigger for volume overload).  - As above, he did not tolerate tadalafil.  - He is at 2000 mcg bid Uptravi.  With excellent echo in 6/22, will keep at this dose.   - Continue riociguat at goal dose 2.5 mg tid.  4. Atrial fibrillation: Paroxysmal, noted on ILR. Regular on exam today.  - Continue apixaban 2.5 mg bid (dosed for age, renal dysfunction). Denies abnormal bleeding. 5. OSA: Cannot tolerate CPAP. I encouraged him to wear his oxygen. 6. CVA: In 3/21, may have been atrial fibrillation-related.  On apixaban now.  7. Carotid stenosis: 40-59% RICA stenosis  on 4/22 dopplers.  - Repeat 4/23.    8. Interstitial lung disease:  Has seen pulmonary, ILD seems mild and stable.  9. Fe deficiency: H/o GI bleeding. Followed by GI.  - GI recommended po iron tablets, will send in Rx. - Check CBC, ferratin, TIBC/iron today and will forward to GI.  Follow up with Dr. Aundra Dubin in 2 months as scheduled.  Wickes FNP 12/31/2020

## 2021-01-07 ENCOUNTER — Other Ambulatory Visit (HOSPITAL_COMMUNITY): Payer: Medicare HMO

## 2021-01-08 ENCOUNTER — Other Ambulatory Visit (HOSPITAL_COMMUNITY): Payer: Medicare HMO

## 2021-01-13 ENCOUNTER — Ambulatory Visit (HOSPITAL_COMMUNITY)
Admission: RE | Admit: 2021-01-13 | Discharge: 2021-01-13 | Disposition: A | Discharge status: Home / Self Care | Payer: Medicare HMO | Source: Ambulatory Visit | Attending: Cardiology | Admitting: Cardiology

## 2021-01-13 ENCOUNTER — Other Ambulatory Visit: Payer: Self-pay

## 2021-01-13 DIAGNOSIS — I5032 Chronic diastolic (congestive) heart failure: Secondary | ICD-10-CM | POA: Diagnosis present

## 2021-01-13 LAB — BASIC METABOLIC PANEL
Anion gap: 12 (ref 5–15)
BUN: 112 mg/dL — ABNORMAL HIGH (ref 8–23)
CO2: 33 mmol/L — ABNORMAL HIGH (ref 22–32)
Calcium: 7.9 mg/dL — ABNORMAL LOW (ref 8.9–10.3)
Chloride: 92 mmol/L — ABNORMAL LOW (ref 98–111)
Creatinine, Ser: 1.78 mg/dL — ABNORMAL HIGH (ref 0.61–1.24)
GFR, Estimated: 36 mL/min — ABNORMAL LOW (ref >60)
Glucose, Bld: 89 mg/dL (ref 70–99)
Potassium: 3.5 mmol/L (ref 3.5–5.1)
Sodium: 137 mmol/L (ref 135–145)

## 2021-01-14 ENCOUNTER — Telehealth (HOSPITAL_COMMUNITY): Payer: Self-pay | Admitting: *Deleted

## 2021-01-14 NOTE — Telephone Encounter (Signed)
Pt left vm reporting last night he had an episode where his heart was racing he checked his pulse ox and his pulse was 160's-170's pt said it last a few minutes then went back to the 60's no other symptoms associated.  Pt asked that we report this to Dr.McLean

## 2021-01-14 NOTE — Telephone Encounter (Signed)
7 day Zio patch to investigate.

## 2021-01-16 NOTE — Telephone Encounter (Signed)
Spoke with pt he said he has not had another episode and for now does not think he needs the zio patch. Pt will call back if he decides he wants the zio patch.

## 2021-01-21 ENCOUNTER — Other Ambulatory Visit: Payer: Self-pay

## 2021-01-21 ENCOUNTER — Encounter (HOSPITAL_COMMUNITY): Payer: Self-pay

## 2021-01-21 ENCOUNTER — Emergency Department (HOSPITAL_COMMUNITY)
Admission: EM | Admit: 2021-01-21 | Discharge: 2021-01-21 | Disposition: A | Discharge status: Home / Self Care | Payer: Medicare HMO | Attending: Emergency Medicine | Admitting: Emergency Medicine

## 2021-01-21 ENCOUNTER — Ambulatory Visit (HOSPITAL_COMMUNITY)
Admission: RE | Admit: 2021-01-21 | Discharge: 2021-01-21 | Disposition: A | Discharge status: Home / Self Care | Payer: Medicare HMO | Source: Ambulatory Visit | Attending: Cardiology | Admitting: Cardiology

## 2021-01-21 ENCOUNTER — Other Ambulatory Visit (HOSPITAL_COMMUNITY): Payer: Self-pay | Admitting: Cardiology

## 2021-01-21 ENCOUNTER — Emergency Department (HOSPITAL_COMMUNITY): Payer: Medicare HMO

## 2021-01-21 DIAGNOSIS — I130 Hypertensive heart and chronic kidney disease with heart failure and stage 1 through stage 4 chronic kidney disease, or unspecified chronic kidney disease: Secondary | ICD-10-CM | POA: Insufficient documentation

## 2021-01-21 DIAGNOSIS — E1122 Type 2 diabetes mellitus with diabetic chronic kidney disease: Secondary | ICD-10-CM | POA: Insufficient documentation

## 2021-01-21 DIAGNOSIS — I5032 Chronic diastolic (congestive) heart failure: Secondary | ICD-10-CM | POA: Diagnosis not present

## 2021-01-21 DIAGNOSIS — Z7901 Long term (current) use of anticoagulants: Secondary | ICD-10-CM | POA: Diagnosis not present

## 2021-01-21 DIAGNOSIS — R000 Tachycardia, unspecified: Secondary | ICD-10-CM | POA: Diagnosis present

## 2021-01-21 DIAGNOSIS — N183 Chronic kidney disease, stage 3 unspecified: Secondary | ICD-10-CM | POA: Insufficient documentation

## 2021-01-21 DIAGNOSIS — J449 Chronic obstructive pulmonary disease, unspecified: Secondary | ICD-10-CM | POA: Diagnosis not present

## 2021-01-21 DIAGNOSIS — J45909 Unspecified asthma, uncomplicated: Secondary | ICD-10-CM | POA: Insufficient documentation

## 2021-01-21 DIAGNOSIS — I4891 Unspecified atrial fibrillation: Secondary | ICD-10-CM | POA: Diagnosis not present

## 2021-01-21 DIAGNOSIS — Z79899 Other long term (current) drug therapy: Secondary | ICD-10-CM | POA: Diagnosis not present

## 2021-01-21 DIAGNOSIS — Z87891 Personal history of nicotine dependence: Secondary | ICD-10-CM | POA: Insufficient documentation

## 2021-01-21 DIAGNOSIS — I480 Paroxysmal atrial fibrillation: Secondary | ICD-10-CM | POA: Insufficient documentation

## 2021-01-21 DIAGNOSIS — R0602 Shortness of breath: Secondary | ICD-10-CM | POA: Diagnosis not present

## 2021-01-21 LAB — CBC WITH DIFFERENTIAL/PLATELET
Abs Immature Granulocytes: 0.05 10*3/uL (ref 0.00–0.07)
Basophils Absolute: 0 10*3/uL (ref 0.0–0.1)
Basophils Relative: 0 %
Eosinophils Absolute: 0.1 10*3/uL (ref 0.0–0.5)
Eosinophils Relative: 1 %
HCT: 39 % (ref 39.0–52.0)
Hemoglobin: 12.1 g/dL — ABNORMAL LOW (ref 13.0–17.0)
Immature Granulocytes: 1 %
Lymphocytes Relative: 14 %
Lymphs Abs: 1.4 10*3/uL (ref 0.7–4.0)
MCH: 27.9 pg (ref 26.0–34.0)
MCHC: 31 g/dL (ref 30.0–36.0)
MCV: 90.1 fL (ref 80.0–100.0)
Monocytes Absolute: 0.6 10*3/uL (ref 0.1–1.0)
Monocytes Relative: 6 %
Neutro Abs: 7.8 10*3/uL — ABNORMAL HIGH (ref 1.7–7.7)
Neutrophils Relative %: 78 %
Platelets: 313 10*3/uL (ref 150–400)
RBC: 4.33 MIL/uL (ref 4.22–5.81)
RDW: 17.7 % — ABNORMAL HIGH (ref 11.5–15.5)
WBC: 9.9 10*3/uL (ref 4.0–10.5)
nRBC: 0 % (ref 0.0–0.2)

## 2021-01-21 LAB — BRAIN NATRIURETIC PEPTIDE: B Natriuretic Peptide: 140 pg/mL — ABNORMAL HIGH (ref 0.0–100.0)

## 2021-01-21 LAB — BASIC METABOLIC PANEL
Anion gap: 12 (ref 5–15)
BUN: 106 mg/dL — ABNORMAL HIGH (ref 8–23)
CO2: 31 mmol/L (ref 22–32)
Calcium: 8.1 mg/dL — ABNORMAL LOW (ref 8.9–10.3)
Chloride: 92 mmol/L — ABNORMAL LOW (ref 98–111)
Creatinine, Ser: 1.87 mg/dL — ABNORMAL HIGH (ref 0.61–1.24)
GFR, Estimated: 34 mL/min — ABNORMAL LOW (ref >60)
Glucose, Bld: 105 mg/dL — ABNORMAL HIGH (ref 70–99)
Potassium: 3.7 mmol/L (ref 3.5–5.1)
Sodium: 135 mmol/L (ref 135–145)

## 2021-01-21 LAB — MAGNESIUM: Magnesium: 2.6 mg/dL — ABNORMAL HIGH (ref 1.7–2.4)

## 2021-01-21 NOTE — ED Notes (Signed)
ED Provider at bedside.

## 2021-01-21 NOTE — Discharge Instructions (Addendum)
Follow-up with your cardiologist to discuss long-term management.  Come back to ER if you develop any chest pain, difficulty in breathing, recurrent palpitations, episodes of passing out or lightheadedness or other new concerning symptom.

## 2021-01-21 NOTE — ED Provider Notes (Signed)
[facility] EMERGENCY DEPARTMENT Provider Note   CSN: [id number] Arrival date & time: 01/21/21  1513     History Chief Complaint  Patient presents with   Tachycardia    Ryan [name] is a 87 y.o. male.  Presenting to the emergency room with concern for elevated heart rate.  Has history of paroxysmal atrial fibrillation.  On anticoagulation.  Patient reports that couple days ago when he checked his oxygen level he noticed his heart rate was in the 130s.  Went to the cardiology clinic for his Zio patch when they noticed his heart rate was rapid, sent to ER for further management.  Patient states that he is may have had some mild generalized fatigue but does not have any palpitations right now no difficulty in breathing, no episodes of lightheadedness or passing out.  No chest pain.  Additional history from chart review, review of most recent cardiology notes.  History of interstitial lung disease, pulmonary hypertension, diastolic heart failure, prior non-Hodgkin's lymphoma.  A. fib, diastolic heart failure.  HPI     Past Medical History:  Diagnosis Date   Asthma    Barrett's esophagus    Benign prostatic hypertrophy    Bilateral lower extremity edema    CHF (congestive heart failure) (HCC)    Chronic fatigue    COPD (chronic obstructive pulmonary disease) (HCC)    Diabetes mellitus type 2 in nonobese ([place]) 04/24/2019   Diverticulosis of colon (without mention of hemorrhage)    Esophageal reflux    Gastritis    Gluten intolerance    Hiatal hernia 02/10/2011   Noted on CT Scan - Moderate Hiatal Hernia   Hyperlipemia    Hypertension    Lymphoma of lymph nodes in pelvis ([place]) 03/03/2011    Large Right Retroperitoneal Mass   Melanoma ([place])    lymphoma   Microcytic anemia 04/20/2011   NHL (non-Hodgkin's lymphoma) ([place])    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 Collene Mares   Pulmonary hyperinflation    Renal cyst 02/10/2011    Noted on CT Scan - Bilateral Renal Cysts   S/P radiation therapy 03/15/11 - 04/09/11   Abdominal/ Pelvic Tumor, 3600 cGy/20 Fractions   Sleep apnea     Patient Active Problem List   Diagnosis Date Noted   Symptomatic anemia    GI bleed 10/17/2020   B12 deficiency 05/28/2019   Cerebrovascular accident (CVA) due to embolism of precerebral artery ([place]) 05/28/2019   Diabetic polyneuropathy associated with type 2 diabetes mellitus ([place]) 05/28/2019   Acute CVA (cerebrovascular accident) ([place]) 04/24/2019   Diabetes mellitus type 2 in nonobese ([place]) 04/24/2019   Acute respiratory failure with hypoxia and hypercarbia ([place]) 09/20/2018   CKD (chronic kidney disease) stage 3, GFR 30-59 ml/min ([place]) 09/20/2018   Lethargy 17/00/1749   Acute metabolic encephalopathy 44/96/7591   Chronic diastolic CHF (congestive heart failure) ([place]) 09/20/2018   AKI (acute kidney injury) ([place]) 09/10/2018   Pulmonary hypertension, unspecified ([place]) 09/10/2018   Multifocal atrial tachycardia ([place]) 09/10/2018   CHF exacerbation ([place]) 09/07/2018   Leg pain 09/07/2018   Hyperkalemia 07/26/2018   Bilateral lower extremity edema 02/02/2017   Pulmonary fibrosis ([place]) 11/22/2016   Blurry vision, bilateral 10/26/2016   COPD ([place]) 07/05/2016   Exertional dyspnea 09/10/2015   Fatigue/malaise 08/19/2015   Asthma/COPD 02/12/2015   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   Chronic diastolic heart failure ([place]) 01/06/2014   Hypoxemia 01/05/2014   Essential hypertension 01/05/2014   Hypokalemia 01/05/2014   Urinary retention 01/05/2014   Hypoxia    Lumbar radiculopathy 12/31/2013   RLS (restless legs syndrome) 09/21/2012   Chronic bilateral low back pain with bilateral sciatica 06/15/2012   Abdominal pain, unspecified site 05/19/2012   Unspecified deficiency anemia  04/07/2012   Melanoma ([place])    Non Hodgkin's lymphoma ([place])    Periaortic lymphadenopathy 02/16/2011   Barrett's esophagus 07/09/2010   Personal history of colonic polyps 05/28/2010   History of IBS 05/28/2010   Diverticulosis of colon (without mention of hemorrhage) 05/28/2010   Arthritis involving multiple sites 05/28/2010   Wheat intolerance 05/28/2010   Chronic venous insufficiency 08/19/2009   Peripheral edema 08/19/2009   INSOMNIA, CHRONIC 12/24/2008   EUSTACHIAN TUBE DYSFUNCTION, BILATERAL 12/23/2008   ERECTILE DYSFUNCTION, ORGANIC 08/01/2008   Allergic rhinitis with a nonallergic component 05/20/2008   VENTRAL HERNIA 02/12/2008   PALPITATIONS 12/25/2007   LACTOSE INTOLERANCE 10/17/2007   Hyperlipidemia 09/14/2006   BPH (benign prostatic hyperplasia) 09/14/2006   OSTEOARTHRITIS 09/14/2006   HEART MURMUR, HX OF 09/14/2006    Past Surgical History:  Procedure Laterality Date   ARTHROSCOPIC REPAIR ACL     right   BONE MARROW ASPIRATION  02/25/11   Bone Marrow, Aspirate, Clot, and Bilateral Bx, Right PIC   CATARACT EXTRACTION, BILATERAL     ESOPHAGOGASTRODUODENOSCOPY (EGD) WITH PROPOFOL N/A 10/19/2020   Procedure: ESOPHAGOGASTRODUODENOSCOPY (EGD) WITH PROPOFOL;  Surgeon: Sharyn Creamer, MD;  Location: [facility];  Service: Gastroenterology;  Laterality: N/A;   EYE SURGERY     HEMOSTASIS CLIP PLACEMENT  10/19/2020   Procedure: HEMOSTASIS CLIP PLACEMENT;  Surgeon: Sharyn Creamer, MD;  [facility] ENDOSCOPY;  Service: Gastroenterology;;   HERNIA REPAIR     LEFT INGUINAL    INSERTION OF MESH N/A 08/23/2012   Procedure: INSERTION OF MESH;  Surgeon: Madilyn Hook, DO;  Location: [facility] ORS;  Service: General;  Laterality: N/A;   LOOP RECORDER INSERTION N/A 04/26/2019   Procedure: LOOP RECORDER INSERTION;  Surgeon: Evans Lance, MD;  Location: [place] CV LAB;  Service: Cardiovascular;  Laterality: N/A;   LUMBAR LAMINECTOMY/DECOMPRESSION MICRODISCECTOMY Right 12/31/2013    Procedure: RIGHT L4-5 L5-S1 LAMINECTOMY;  Surgeon: Kristeen Miss, MD;  Location: [facility] NEURO ORS;  Service: Neurosurgery;  Laterality: Right;  RIGHT L4-5 L5-S1 LAMINECTOMY   POLYPECTOMY  10/19/2020   Procedure: POLYPECTOMY;  Surgeon: Sharyn Creamer, MD;  Location: [facility] ENDOSCOPY;  Service: Gastroenterology;;   RIGHT HEART CATH N/A 07/25/2018   Procedure: RIGHT HEART CATH;  Surgeon: Larey Dresser, MD;  Location: [place] CV LAB;  Service: Cardiovascular;  Laterality: N/A;   ROTATOR CUFF REPAIR     right   TONSILLECTOMY     TONSILLECTOMY     VENTRAL HERNIA REPAIR N/A 08/23/2012   Procedure: HERNIA REPAIR VENTRAL ADULT;  Surgeon: Madilyn Hook, DO;  Location: [facility] ORS;  Service: General;  Laterality: N/A;       Family History  Problem Relation Age of Onset   Dementia Mother    Heart disease Father        MI 52   Urticaria Father    Dementia Sister    Parkinson's disease Sister    ALS Sister    Prostate cancer Brother    Prostate cancer Paternal Uncle    Prostate cancer Paternal Uncle    Prostate cancer Paternal Uncle  Hyperlipidemia Other    Stroke Other    Hypertension Other    ADD / ADHD Other    Colon cancer Neg Hx    Allergic rhinitis Neg Hx    Asthma Neg Hx    Eczema Neg Hx     Social History   Tobacco Use   Smoking status: Former    Packs/day: 1.00    Years: 35.00    Pack years: 35.00    Types: Pipe, Cigarettes    Quit date: 02/23/1992    Years since quitting: 28.9   Smokeless tobacco: Never   Tobacco comments:    quit 20 years ago  Vaping Use   Vaping Use: Never used  Substance Use Topics   Alcohol use: Not Currently    Comment: 2 drinks daily scotch  AND WINE WITH SUPPER   Drug use: No    Home Medications Prior to Admission medications   Medication Sig Start Date End Date Taking? Authorizing Provider  acetaminophen (TYLENOL) 500 MG tablet Take 500 mg by mouth every 6 (six) hours as needed for mild pain or headache.    [provider]  apixaban  (ELIQUIS) 2.5 MG TABS tablet Take 1 tablet (2.5 mg total) by mouth 2 (two) times daily. 10/22/20   Ghimire, Henreitta Leber, MD  atorvastatin (LIPITOR) 40 MG tablet Take 1 tablet (40 mg total) by mouth daily at 6 PM. 04/27/19   Hosie Poisson, MD  cyanocobalamin (,VITAMIN B-12,) 1000 MCG/ML injection Inject 1,000 mcg into the muscle every 30 (thirty) days.    [provider]  escitalopram (LEXAPRO) 10 MG tablet Take 10 mg by mouth daily.    [provider]  finasteride (PROSCAR) 5 MG tablet Take 5 mg by mouth daily.    [provider]  FLONASE SENSIMIST 27.5 MCG/SPRAY nasal spray Place 1 spray into the nose daily as needed for rhinitis or allergies.  12/11/18   [provider]  Iron, Ferrous Sulfate, 325 (65 Fe) MG TABS Take 325 mg by mouth daily. 12/31/20   Rafael Bihari, FNP  Melatonin 10 MG TABS Take 10 mg by mouth at bedtime.    [provider]  metolazone (ZAROXOLYN) 2.5 MG tablet Take 1 tablet (2.5 mg total) by mouth once a week. Take 1 tablet every thursday 12/31/20 03/31/21  Rafael Bihari, FNP  metoprolol succinate (TOPROL XL) 25 MG 24 hr tablet Take 0.5 tablets (12.5 mg total) by mouth at bedtime. 10/23/20   Larey Dresser, MD  NON FORMULARY Take 1 tablet by mouth See admin instructions. Unnamed laxative: Take 1 tablet by mouth at bedtime as needed for iron-induced constipation    [provider]  OXYGEN Inhale 2 L/min into the lungs. As needed and at night    [provider]  pantoprazole (PROTONIX) 40 MG tablet Take 1 tablet (40 mg total) by mouth 2 (two) times daily. Take twice daily for 8 weeks and then change in dosing to once daily. 10/21/20   Ghimire, Henreitta Leber, MD  potassium chloride SA (KLOR-CON) 20 MEQ tablet Take 1 tablet (20 mEq total) by mouth once a week. Patient taking differently: Take 20 mEq by mouth every 14 (fourteen) days. 11/19/20   [place], Maricela Bo, FNP  Riociguat (ADEMPAS) 2.5 MG TABS Take 2.5 mg by mouth 3  (three) times daily. 10/15/20   Larey Dresser, MD  rOPINIRole (REQUIP) 2 MG tablet Take 1 tablet (2 mg total) by mouth 3 (three) times daily. 12/02/20  Ward Givens, NP  torsemide (DEMADEX) 100 MG tablet Take 1 tablet (100 mg total) by mouth 2 (two) times daily. 11/13/20   [place], Maricela Bo, FNP  traMADol (ULTRAM) 50 MG tablet Take 50 mg by mouth every 6 (six) hours. As needed    [provider]  traZODone (DESYREL) 100 MG tablet Take 100 mg by mouth at bedtime. 02/12/19   [provider]  UPTRAVI 1600 MCG TABS Take 1,600 mcg by mouth 2 (two) times daily.    [provider]  UPTRAVI 200 MCG TABS Take 400 mcg by mouth 2 (two) times daily.    [provider]    Allergies    Pollen extract-tree extract, Feraheme [ferumoxytol], and Molds & smuts  Review of Systems   Review of Systems  Constitutional:  Negative for chills and fever.  HENT:  Negative for ear pain and sore throat.   Eyes:  Negative for pain and visual disturbance.  Respiratory:  Negative for cough and shortness of breath.   Cardiovascular:  Negative for chest pain and palpitations.  Gastrointestinal:  Negative for abdominal pain and vomiting.  Genitourinary:  Negative for dysuria and hematuria.  Musculoskeletal:  Negative for arthralgias and back pain.  Skin:  Negative for color change and rash.  Neurological:  Negative for seizures and syncope.  All other systems reviewed and are negative.  Physical Exam Updated Vital Signs BP 94/74    Pulse 87    Temp 98.2 F (36.8 C) (Oral)    Resp (!) 24    Ht _0  (1.702 m)    Wt 77.6 kg    SpO2 97%    BMI 26.78 kg/m   Physical Exam Vitals and nursing note reviewed.  Constitutional:      General: He is not in acute distress.    Appearance: He is well-developed.  HENT:     Head: Normocephalic and atraumatic.  Eyes:     Conjunctiva/sclera: Conjunctivae normal.  Cardiovascular:     Rate and Rhythm: Tachycardia present. Rhythm irregular.      Heart sounds: No murmur heard. Pulmonary:     Effort: Pulmonary effort is normal. No respiratory distress.     Breath sounds: Normal breath sounds.  Abdominal:     Palpations: Abdomen is soft.     Tenderness: There is no abdominal tenderness.  Musculoskeletal:        General: No swelling.     Cervical back: Neck supple.  Skin:    General: Skin is warm and dry.     Capillary Refill: Capillary refill takes less than 2 seconds.  Neurological:     Mental Status: He is alert.  Psychiatric:        Mood and Affect: Mood normal.    ED Results / Procedures / Treatments   Labs (all labs ordered are listed, but only abnormal results are displayed) Labs Reviewed  CBC WITH DIFFERENTIAL/PLATELET - Abnormal; Notable for the following components:      Result Value   Hemoglobin 12.1 (*)    RDW 17.7 (*)    Neutro Abs 7.8 (*)    All other components within normal limits  BASIC METABOLIC PANEL - Abnormal; Notable for the following components:   Chloride 92 (*)    Glucose, Bld 105 (*)    BUN 106 (*)    Creatinine, Ser 1.87 (*)    Calcium 8.1 (*)    GFR, Estimated 34 (*)    All other components within normal limits  MAGNESIUM -  Abnormal; Notable for the following components:   Magnesium 2.6 (*)    All other components within normal limits  BRAIN NATRIURETIC PEPTIDE - Abnormal; Notable for the following components:   B Natriuretic Peptide 140.0 (*)    All other components within normal limits    EKG EKG Interpretation  Date/Time:  Wednesday January 21 2021 15:18:50 EST Ventricular Rate:  152 PR Interval:    QRS Duration: 113 QT Interval:  311 QTC Calculation: 495 R Axis:   96 Text Interpretation: Atrial fibrillation with rapid V-rate Incomplete right bundle branch block Confirmed by Madalyn Rob [phone]) on 01/21/2021 3:40:44 PM  Radiology DG Chest Portable 1 View  Result Date: 01/21/2021 CLINICAL DATA:  Palpitations EXAM: PORTABLE CHEST 1 VIEW COMPARISON:  Chest x-ray  dated October 17, 2020 FINDINGS: Cardiac and mediastinal contours are unchanged. Elevation of the left hemidiaphragm. Left basilar opacity, similar to prior and likely due to atelectasis. No new parenchymal opacity. No large pleural effusion or pneumothorax. IMPRESSION: Elevation of the left hemidiaphragm with associated left basilar opacity which is similar to prior exam and likely due to atelectasis. Electronically Signed   By: Yetta Glassman M.D.   On: 01/21/2021 16:39    Procedures Procedures   Medications Ordered in ED Medications - No data to display  ED Course  I have reviewed the triage vital signs and the nursing notes.  Pertinent labs & imaging results that were available during my care of the patient were reviewed by me and considered in my medical decision making (see chart for details).    MDM Rules/Calculators/A&P                         87 year old gentleman with atrial fibrillation, diastolic heart failure, pulmonary hypertension presented to ER with concern for elevated heart rate.  Initial EKG here demonstrated A. fib with RVR.  While he was in the ER, he appears to have spontaneously converted to sinus rhythm.  Cardiology was consulted as patient is followed quite closely by Dr. Aundra Dubin for this issue.  Dr. Missy Sabins came to bedside and evaluated patient.  Given patient's current heart rate, well appearance, he recommends discharge home.  He will discuss long-term medication management with patient's primary cardiologist and follow-up with patient, no changes for tonight.  Discussed plan with patient, advised need for close follow-up with cardiology and stressed strict return precautions.  After the discussed management above, the patient was determined to be safe for discharge.  The patient was in agreement with this plan and all questions regarding their care were answered.  ED return precautions were discussed and the patient will return to the ED with any significant  worsening of condition.     Final Clinical Impression(s) / ED Diagnoses Final diagnoses:  Atrial fibrillation with RVR (HCC)  Paroxysmal A-fib [facility])    Rx / DC Orders ED Discharge Orders     None        Lucrezia Starch, MD 01/22/21 1511

## 2021-01-21 NOTE — ED Triage Notes (Signed)
Had episode of elevated HR yesterday and self converted, was at heart failure clinic today for a ziopatch when he noted rapid HR again. States he has chronic SOB with exertion.

## 2021-01-21 NOTE — Progress Notes (Signed)
Patient was seen in clinic for zio monitor. Pt c/o rapid heart rate on 12/14 per Dr.McLean apply zio patch pt declined zio patch but called today with another episode of fast heart rate and shortness of breath and requested we apply the zio monitor. Upon rooming pt I noticed he was very short of breath and could see his heart rate was elevated. I ordered an ekg for pt. Pt was in SVT rate 151 and bp checked by Debara Pickett was 90/60. Per Darden Palmer pt needs to go to the emergency room. Debara Pickett spoke with pt about going to the ED and being admitted pt agreeable with plan and contacted his wife. Amy Clegg,NP advised we call rapid response. Daphne Wood,RN called rapid response. Rapid response took pt to the emergency room. Zio patch not placed order for zio cancelled.

## 2021-01-21 NOTE — ED Notes (Addendum)
Cardiology provider at bedside, notified and aware of BP readings with systolic BP <391. Spouse at bedside.

## 2021-01-21 NOTE — Consult Note (Addendum)
Advanced Heart Failure Team Consult Note   Primary Physician: Roetta Sessions, NP PCP-Cardiologist:  [name], MD  Reason for Consultation: Afib with RVR  HPI:    Ryan [name] is seen today for evaluation of Afib with RVR at the request of Dr. Roslynn Amble.   87 y.o. male with history of interstitial lung disease (?NSIP vs UIP) on home oxygen, pulmonary hypertension, diastolic CHF, and prior non-Hodgkins lymphoma (pelvic mass, s/p XRT) . ILD followed by a pulmonologist at [facility] and pulmonary hypertension thought to be out of proportion lung disease followed by a physician at [facility].  CT chest 11/19 showing stable ILD (?UIP vs NSIP).  He had RHC in 2/20 with moderate PAH, PVR 5.1 WU.  He was tried on Adcirca but developed cognitive problems and worsening of peripheral edema so this was stopped.    He was admitted in 6/20 with worsening dyspnea and peripheral edema, weight up despite compliance with torsemide.  Echo showed normal LV size and systolic function, RV reportedly normal.  6/20 RHC showed normal filling pressures with moderate pulmonary hypertension, PVR 5.35 WU.    After discharge from hospital, patient started on ambrisentan.  His weight steadily began to increase and he developed worsening lower extremity edema, so ambrisentan was stopped.   Patient was admitted in 8/20 with AKI after taking metolazone for several days.  He was admitted again in 8/20 with acute on chronic diastolic CHF and was diuresed.  Wandering atrial pacemaker noted.    Readmitted in 3/21 with suspected CVA, MRI head showed punctate occipital lobe infarct, thought to be due to small vessel disease. ILR was implanted.  Echo in 3/21 showed EF 60-65%, severe LVH, normal RV.  PYP scan in 3/21 was not suggestive of TTR amyloidosis.  Subsequently, ILR has shown paroxysmal atrial fibrillation.    He was admitted later in 3/21 with AKI, creatinine up to 5.  However, he corrected rapidly to baseline  with IVF and creatinine was 1.02 when discharge.    Echo in 6/22 showed EF 65-70% with mild LVH, normal RV, IVC normal, PASP estimated 29 mmHg.    Patient was admitted in 9/22 with upper GI bleed from bleeding gastric polyps.  He had polypectomy and has been restarted on Eliquis.    He was seen in the office 10/23/20 and BMET showed creatinine up to 4.18.  He was also noted to be Fe deficient.  Torsemide, metolazone, and spironolactone were stopped. Follow up he felt better. Feraheme infusions arranged.   Last seen for follow-up 12/31/20. Back on Torsemide at 100 mg BID and 2.5 mg metolazone increased to once weekly for suspected volume overload.  Called clinic on 12/14 reporting episode of tachycardia with HR 160s-170s the night before. Lasted a few minutes. Initially declined Zio patch but called today with recurrent tachycardia. Presented to clinic today for ECG and to have ZioPatch placed. He appeared visibly dyspneic moving into the exam room. ECG demonstrated Afib with RVR, 150s. BP 90/60 mmHg. Rapid response transported patient to ED for further management.  ECG on arrival to ED demonstrated AF With rates in 150s. Shortly after, he converted to SR 90s with PACs.  BP 90s/60s. O2 stable on RA. Feels weak after today's episode. Has occasionally noticed palpitations but no dizziness. SOB with minimal activity at baseline but no recent worsening symptoms. No CP. Denies recent fever, chills, cough.  Has lost 10 lb after last f/u with increasing frequency of metolazone.  Review of  Systems: [y] = yes, _0  = no   General: Weight gain _1 ; Weight loss [Y]; Anorexia _2 ; Fatigue _3 ; Fever _4 ; Chills _5 ; Weakness [Y]  Cardiac: Chest pain/pressure _6 ; Resting SOB _7 ; Exertional SOB [Y]; Orthopnea _8 ; Pedal Edema _9 ; Palpitations [Y]; Syncope _10 ; Presyncope _11 ; Paroxysmal nocturnal dyspnea_12   Pulmonary: Cough _13 ; Wheezing_14 ; Hemoptysis_15 ; Sputum _16 ; Snoring _17   GI: Vomiting_18 ; Dysphagia[  ]; Melena_19 ; Hematochezia _20 ; Heartburn_21 ; Abdominal pain _22 ; Constipation _23 ; Diarrhea _24 ; BRBPR _25   GU: Hematuria_26 ; Dysuria _27 ; Nocturia_28   Vascular: Pain in legs with walking _29 ; Pain in feet with lying flat _30 ; Non-healing sores _31 ; Stroke [Y]; TIA _32 ; Slurred speech _33 ;  Neuro: Headaches_34 ; Vertigo_35 ; Seizures_36 ; Paresthesias_37 ;Blurred vision _38 ; Diplopia _39 ; Vision changes _40   Ortho/Skin: Arthritis _41 ; Joint pain _42 ; Muscle pain _43 ; Joint swelling _44 ; Back Pain _45 ; Rash _46   Psych: Depression_47 ; Anxiety_48   Heme: Bleeding problems [Y]; Clotting disorders _49 ; Anemia [Y]  Endocrine: Diabetes _50 ; Thyroid dysfunction_51   Home Medications Prior to Admission medications   Medication Sig Start Date End Date Taking? Authorizing Provider  acetaminophen (TYLENOL) 500 MG tablet Take 500 mg by mouth every 6 (six) hours as needed for mild pain or headache.    [provider]  apixaban (ELIQUIS) 2.5 MG TABS tablet Take 1 tablet (2.5 mg total) by mouth 2 (two) times daily. 10/22/20   Ghimire, Henreitta Leber, MD  atorvastatin (LIPITOR) 40 MG tablet Take 1 tablet (40 mg total) by mouth daily at 6 PM. 04/27/19   Hosie Poisson, MD  cyanocobalamin (,VITAMIN B-12,) 1000 MCG/ML injection Inject 1,000 mcg into the muscle every 30 (thirty) days.    [provider]  escitalopram (LEXAPRO) 10 MG tablet Take 10 mg by mouth daily.    [provider]  finasteride (PROSCAR) 5 MG tablet Take 5 mg by mouth daily.    [provider]  FLONASE SENSIMIST 27.5 MCG/SPRAY nasal spray Place 1 spray into the nose daily as needed for rhinitis or allergies.  12/11/18   [provider]  Iron, Ferrous Sulfate, 325 (65 Fe) MG TABS Take 325 mg by mouth daily. 12/31/20   Rafael Bihari, FNP  Melatonin 10 MG TABS Take 10 mg by mouth at bedtime.    [provider]  metolazone (ZAROXOLYN) 2.5 MG tablet Take 1 tablet (2.5 mg total) by mouth once a week. Take 1  tablet every thursday 12/31/20 03/31/21  Rafael Bihari, FNP  metoprolol succinate (TOPROL XL) 25 MG 24 hr tablet Take 0.5 tablets (12.5 mg total) by mouth at bedtime. 10/23/20   Larey Dresser, MD  NON FORMULARY Take 1 tablet by mouth See admin instructions. Unnamed laxative: Take 1 tablet by mouth at bedtime as needed for iron-induced constipation    [provider]  OXYGEN Inhale 2 L/min into the lungs. As needed and at night    [provider]  pantoprazole (PROTONIX) 40 MG tablet Take 1 tablet (40 mg total) by mouth 2 (two) times daily. Take twice daily for 8 weeks and then change in dosing to once daily. 10/21/20   Ghimire, Henreitta Leber, MD  potassium chloride SA (KLOR-CON) 20 MEQ tablet Take 1 tablet (20 mEq total) by mouth once a  week. Patient taking differently: Take 20 mEq by mouth every 14 (fourteen) days. 11/19/20   [place], Maricela Bo, FNP  Riociguat (ADEMPAS) 2.5 MG TABS Take 2.5 mg by mouth 3 (three) times daily. 10/15/20   Larey Dresser, MD  rOPINIRole (REQUIP) 2 MG tablet Take 1 tablet (2 mg total) by mouth 3 (three) times daily. 12/02/20   Ward Givens, NP  torsemide (DEMADEX) 100 MG tablet Take 1 tablet (100 mg total) by mouth 2 (two) times daily. 11/13/20   [place], Maricela Bo, FNP  traMADol (ULTRAM) 50 MG tablet Take 50 mg by mouth every 6 (six) hours. As needed    [provider]  traZODone (DESYREL) 100 MG tablet Take 100 mg by mouth at bedtime. 02/12/19   [provider]  UPTRAVI 1600 MCG TABS Take 1,600 mcg by mouth 2 (two) times daily.    [provider]  UPTRAVI 200 MCG TABS Take 400 mcg by mouth 2 (two) times daily.    [provider]    Past Medical History: Past Medical History:  Diagnosis Date   Asthma    Barrett's esophagus    Benign prostatic hypertrophy    Bilateral lower extremity edema    CHF (congestive heart failure) (HCC)    Chronic fatigue    COPD (chronic obstructive pulmonary disease) (HCC)     Diabetes mellitus type 2 in nonobese ([place]) 04/24/2019   Diverticulosis of colon (without mention of hemorrhage)    Esophageal reflux    Gastritis    Gluten intolerance    Hiatal hernia 02/10/2011   Noted on CT Scan - Moderate Hiatal Hernia   Hyperlipemia    Hypertension    Lymphoma of lymph nodes in pelvis ([place]) 03/03/2011    Large Right Retroperitoneal Mass   Melanoma ([place])    lymphoma   Microcytic anemia 04/20/2011   NHL (non-Hodgkin's 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 Collene Mares   Pulmonary hyperinflation    Renal cyst 02/10/2011    Noted on CT Scan - Bilateral Renal Cysts   S/P radiation therapy 03/15/11 - 04/09/11   Abdominal/ Pelvic Tumor, 3600 cGy/20 Fractions   Sleep apnea     Past Surgical History: Past Surgical History:  Procedure Laterality Date   ARTHROSCOPIC REPAIR ACL     right   BONE MARROW ASPIRATION  02/25/11   Bone Marrow, Aspirate, Clot, and Bilateral Bx, Right PIC   CATARACT EXTRACTION, BILATERAL     ESOPHAGOGASTRODUODENOSCOPY (EGD) WITH PROPOFOL N/A 10/19/2020   Procedure: ESOPHAGOGASTRODUODENOSCOPY (EGD) WITH PROPOFOL;  Surgeon: Sharyn Creamer, MD;  Location: [facility];  Service: Gastroenterology;  Laterality: N/A;   EYE SURGERY     HEMOSTASIS CLIP PLACEMENT  10/19/2020   Procedure: HEMOSTASIS CLIP PLACEMENT;  Surgeon: Sharyn Creamer, MD;  Location: [facility] ENDOSCOPY;  Service: Gastroenterology;;   HERNIA REPAIR     LEFT INGUINAL    INSERTION OF MESH N/A 08/23/2012   Procedure: INSERTION OF MESH;  Surgeon: Madilyn Hook, DO;  Location: [facility];  Service: General;  Laterality: N/A;   LOOP RECORDER INSERTION N/A 04/26/2019   Procedure: LOOP RECORDER INSERTION;  Surgeon: Evans Lance, MD;  Location: [place] CV LAB;  Service: Cardiovascular;  Laterality: N/A;   LUMBAR  LAMINECTOMY/DECOMPRESSION MICRODISCECTOMY Right 12/31/2013   Procedure: RIGHT L4-5 L5-S1 LAMINECTOMY;  Surgeon:  Kristeen Miss, MD;  Location: [facility] NEURO ORS;  Service: Neurosurgery;  Laterality: Right;  RIGHT L4-5 L5-S1 LAMINECTOMY   POLYPECTOMY  10/19/2020   Procedure: POLYPECTOMY;  Surgeon: Sharyn Creamer, MD;  Location: [facility]COPY;  Service: Gastroenterology;;   RIGHT HEART CATH N/A 07/25/2018   Procedure: RIGHT HEART CATH;  Surgeon: Larey Dresser, MD;  Location: [place] CV LAB;  Service: Cardiovascular;  Laterality: N/A;   ROTATOR CUFF REPAIR     right   TONSILLECTOMY     TONSILLECTOMY     VENTRAL HERNIA REPAIR N/A 08/23/2012   Procedure: HERNIA REPAIR VENTRAL ADULT;  Surgeon: Madilyn Hook, DO;  Location: [facility];  Service: General;  Laterality: N/A;    Family History: Family History  Problem Relation Age of Onset   Dementia Mother    Heart disease Father        MI 30   Urticaria Father    Dementia Sister    Parkinson's disease Sister    ALS Sister    Prostate cancer Brother    Prostate cancer Paternal Uncle    Prostate cancer Paternal Uncle    Prostate cancer Paternal Uncle    Hyperlipidemia Other    Stroke Other    Hypertension Other    ADD / ADHD Other    Colon cancer Neg Hx    Allergic rhinitis Neg Hx    Asthma Neg Hx    Eczema Neg Hx     Social History: 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[employer], shop  Tobacco Use   Smoking status: Former    Packs/day: 1.00    Years: 35.00    Pack years: 35.00    Types: Pipe, Cigarettes    Quit date: 02/23/1992    Years since quitting: 28.9   Smokeless tobacco: Never   Tobacco comments:    quit 20 years ago  Vaping Use   Vaping Use: Never used  Substance and Sexual Activity   Alcohol  use: Not Currently    Comment: 2 drinks daily scotch  AND WINE WITH SUPPER   Drug use: No   Sexual activity: Not on file  Other Topics Concern   Not on file  Social History Narrative   Married - second marriage   He has two children   Retired Horticulturist, commercial Professor   Currently teaches pottery making, 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[facility]   Former Smoker quit 12 years ago- smoked for 35 years   Alcohol use- not currently   Right handed   Social Determinants of Radio broadcast assistant Strain: Not on file  Food Insecurity: Not on file  Transportation Needs: Not on file  Physical Activity: Not on file  Stress: Not on file  Social Connections: Not on file    Allergies:  Allergies  Allergen Reactions   Pollen Extract-Tree Extract Other (See Comments)    "HEADACHES, TIRED, DRAINAGE FROM SINUSES"   Feraheme [Ferumoxytol] Other (See Comments)    Chest pain, facial flushing, shortness of breath   Molds & Smuts Other (See Comments)    Also dust mites causes sinus infections, h/a etc.    Objective:    Vital Signs:   Temp:  [98 F (36.7  C)] 98 F (36.7 C) (12/21 1522) Pulse Rate:  [92-142] 92 (12/21 1545) Resp:  [15-32] 15 (12/21 1545) BP: (93-135)/(66-116) 93/66 (12/21 1545) SpO2:  [95 %] 95 % (12/21 1545) Weight:  [77.6 kg] 77.6 kg (12/21 1523)    Weight change: Filed Weights   01/21/21 1523  Weight: 77.6 kg    Intake/Output:  No intake or output data in the 24 hours ending 01/21/21 1604    Physical Exam    General:  Elderly male lying comfortably in bed HEENT: normal Neck: supple. No JVD . Carotids 2+ bilat; no bruits. No lymphadenopathy or thyromegaly appreciated. Cor: PMI nondisplaced. Regular rate & rhythm with ectopy. No rubs, gallops or murmurs. Lungs: clear Abdomen: soft, nontender, nondistended. No hepatosplenomegaly. No bruits or masses. Good bowel sounds. Extremities: no cyanosis, clubbing, rash, 2+ LE edema Neuro: alert & orientedx3, cranial nerves  grossly intact. moves all 4 extremities w/o difficulty. Affect pleasant   Telemetry   AF with RVR -> SR 90s with PACs  EKG    AF 158 bpm  Labs   Basic Metabolic Panel: No results for input(s): NA, K, CL, CO2, GLUCOSE, BUN, CREATININE, CALCIUM, MG, PHOS in the last 168 hours.  Liver Function Tests: No results for input(s): AST, ALT, ALKPHOS, BILITOT, PROT, ALBUMIN in the last 168 hours. No results for input(s): LIPASE, AMYLASE in the last 168 hours. No results for input(s): AMMONIA in the last 168 hours.  CBC: Recent Labs  Lab 01/21/21 1524  WBC 9.9  NEUTROABS 7.8*  HGB 12.1*  HCT 39.0  MCV 90.1  PLT 313    Cardiac Enzymes: No results for input(s): CKTOTAL, CKMB, CKMBINDEX, TROPONINI in the last 168 hours.  BNP: BNP (last 3 results) Recent Labs    09/08/20 1431 10/23/20 1348 11/19/20 1535  BNP 133.1* 38.5 165.0*    ProBNP (last 3 results) No results for input(s): PROBNP in the last 8760 hours.   CBG: No results for input(s): GLUCAP in the last 168 hours.  Coagulation Studies: No results for input(s): LABPROT, INR in the last 72 hours.   Imaging   No results found.   Medications:     Current Medications:   Infusions:     Patient Profile   87 y.o. male with history of ILD on home O2, pulmonary HTN, chronic diastolic HF, CKD stage III, paroxysmal AF.  Now presenting with recurrent AF with RVR.  Assessment/Plan   Atrial fibrillation with RVR: -11% AF burden on device interrogation 11/22 -Episode at home a week ago and again today. Spontaneously converted to SR with PACs while in ED -Okay for discharge today.  -Dr. Haroldine Laws will discuss potentially adding antiarrhythmic therapy with his cardiologist, Dr. Aundra Dubin, as well as EP. Will call him at home with plan. -Not a good candidate for amiodarone with underlying ILD. Could consider Multaq, EF has been preserved. No known CAD, but not able to locate prior LHC. -Anticoagulated with  Eliquis 2.5 mg BID (dosed for age and renal impairment)  2. Chronic diastolic HF: -Suspect significant component of RV failure from pulmonary hypertension.   -Echo 6/20 with EF 60-65%, RV not well-visualized but PASP 59 mmHg.   -Echo (3/21) with EF 60-65%, severe LVH, normal RV, PASP 28 mmHg.   -PYP scan not suggestive of TTR cardiac amyloidosis.  -Echo in 6/22 showed EF 65-70% with mild LVH, normal RV, IVC normal, PASP estimated 29 mmHg.   -NYHA class III -Volume appears stable, maybe on dry side. Weight down 10 lb  from last clinic visit. BP soft 54G systolic today.  -On 920 mg Torsemide BID + 2.5 mg metolazone once weekly. Recommend he hold off on metolazone for a week then decrease metolazone from every 7 days to every 10 days. -Does not tolerate TED hose or UNNA boots  3. Pulmonary hypertension: -Patient thought to have [place] out of proportion to his lung disease (ILD, NSIP versus UIP).  -RHC 02/20: mean PA pressure 41, PVR 5.1 WU, CI 3.  -Echo 6/20 with RV reportedly ok => Dr. Aundra Dubin reviewed and do not think that RV was well-visualized.   -Patient did not tolerate Adcirca due to edema/?cognitive effects.   -RHC 6/20: moderate pulmonary arterial hypertension with PVR 5.35 (comparable to prior).   -Echo 6/22 showed normal RV size/systolic function and PA systolic pressure estimate was normal. -Serological workup for PH was negative. -Patient started ambrisentan 5 mg and developed significant edema/volume overload despite a high dose of torsemide (? Trigger for volume overload). -He is now on Uptravi 2000 mg BID + riociguat 2.5 mg TID - As above, he did not tolerate tadalafil.   4. Stage IIIb CKD: -Several AKIs, recent baseline Cr seems to be around 1.7-2 -Scr 1.87 today, BUN 106 -Decreasing metolazone as above  5. OSA: - Unable to tolerate CPAP  6. Hx CVA: - May have been related to AF - Anticoagulated  7. ILD: - Has been evaluated by pulmonary - Uses O2 at night and with  activity, O2 sats stable on RA today  Length of Stay: 0  FINCH, LINDSAY N, PA-C  01/21/2021, 4:04 PM  Advanced Heart Failure Team Pager [phone] (M-F; 7a - 5p)  Please contact [facility] for night-coverage after hours (4p -7a ) and weekends on [email or web address]   Patient seen and examined with the above-signed Advanced Practice Provider and/or Housestaff. I personally reviewed laboratory data, imaging studies and relevant notes. I independently examined the patient and formulated the important aspects of the plan. I have edited the note to reflect any of my changes or salient points. I have personally discussed the plan with the patient and/or family.   87 y/o male with IPF, diastolic HF and PAF. Sent to ED from [facility] due to AF with RVR and low BP.   In ER spontaneously converted to NSR. SBP soft but stable in 90s. Denies palpitations, SOB, orthopnea or PND. Has been taking metolazone weekly and said it has really been driving fluid down and whis wife worries it may be too much.   ILR download reviewed: In November: 500 episodes of AF. Longest 3hr. 11% burden   General:  Elderly male. Lying flat in bed No resp difficulty HEENT: normal Neck: supple. no JVD. Carotids 2+ bilat; no bruits. No lymphadenopathy or thryomegaly appreciated. Cor: PMI nondisplaced. Regular rate & rhythm. No rubs, gallops or murmurs. Lungs: Decreased throughout Abdomen: soft, nontender, nondistended. No hepatosplenomegaly. No bruits or masses. Good bowel sounds. Extremities: no cyanosis, clubbing, rash, mild edema. Chronic venous stasis changes Neuro: alert & orientedx3, cranial nerves grossly intact. moves all 4 extremities w/o difficulty. Affect pleasant  He is having relatively frequent AF (11% burden). Options for AAD limited due to IPF and HF with need for high-diuretic dosing. D/w Dr. Aundra Dubin. Low-dose flecainide (50 bid) may be an option. I will d/w Dr. Lovena Le as well. Given low BP can consider spacing out  metolazone to q10 day instead of q7days.   OK to go home from ER. D/w Dr. Roslynn Amble.   Glori Bickers,  MD  10:51 PM

## 2021-01-22 LAB — CUP PACEART REMOTE DEVICE CHECK
Date Time Interrogation Session: 20221222085316
Implantable Pulse Generator Implant Date: 20210325

## 2021-01-27 ENCOUNTER — Ambulatory Visit (INDEPENDENT_AMBULATORY_CARE_PROVIDER_SITE_OTHER): Payer: Medicare HMO

## 2021-01-27 DIAGNOSIS — Z8673 Personal history of transient ischemic attack (TIA), and cerebral infarction without residual deficits: Secondary | ICD-10-CM | POA: Diagnosis not present

## 2021-02-03 ENCOUNTER — Telehealth (HOSPITAL_COMMUNITY): Payer: Self-pay | Admitting: Pharmacist

## 2021-02-03 NOTE — Telephone Encounter (Signed)
Attempted to submit PAs for Uptravi and Adempas. Received the follow message from [facility]: The patient currently has access to the requested medication and a Prior Authorization is not needed for the patient/medication.   Audry Riles, PharmD, BCPS, BCCP, CPP Heart Failure Clinic Pharmacist [phone]

## 2021-02-05 NOTE — Progress Notes (Signed)
Carelink Summary Report / Loop Recorder 

## 2021-02-16 ENCOUNTER — Telehealth (HOSPITAL_COMMUNITY): Payer: Self-pay | Admitting: Pharmacy Technician

## 2021-02-16 NOTE — Telephone Encounter (Signed)
Advanced Heart Failure Patient Advocate Encounter  The patient was approved for a TAF grant that will cover the cost of Uptravi and Adempas.  Member Number [id number]  Group Number [id number] Coverage from date 02/02/2020 Coverage to date 01/31/2022 Claims Processing [place] PCN: [id number] BIN: [id number] Processing: 08 Phone: [phone] Fax: [place], CPhT

## 2021-02-19 ENCOUNTER — Ambulatory Visit: Payer: Medicare HMO | Admitting: Internal Medicine

## 2021-02-20 ENCOUNTER — Encounter (HOSPITAL_COMMUNITY): Payer: Self-pay

## 2021-02-20 NOTE — Progress Notes (Unsigned)
VM left to remind patient of appointment for Monday January 23,2023_0  pm

## 2021-02-23 ENCOUNTER — Encounter (HOSPITAL_COMMUNITY): Payer: Self-pay | Admitting: Cardiology

## 2021-02-23 ENCOUNTER — Ambulatory Visit (HOSPITAL_COMMUNITY)
Admission: RE | Admit: 2021-02-23 | Discharge: 2021-02-23 | Disposition: A | Discharge status: Home / Self Care | Payer: Medicare HMO | Source: Ambulatory Visit | Attending: Cardiology | Admitting: Cardiology

## 2021-02-23 ENCOUNTER — Other Ambulatory Visit: Payer: Self-pay

## 2021-02-23 VITALS — BP 86/52 | HR 94 | Wt 177.6 lb

## 2021-02-23 DIAGNOSIS — G4733 Obstructive sleep apnea (adult) (pediatric): Secondary | ICD-10-CM | POA: Diagnosis not present

## 2021-02-23 DIAGNOSIS — I2721 Secondary pulmonary arterial hypertension: Secondary | ICD-10-CM | POA: Insufficient documentation

## 2021-02-23 DIAGNOSIS — Z7901 Long term (current) use of anticoagulants: Secondary | ICD-10-CM | POA: Insufficient documentation

## 2021-02-23 DIAGNOSIS — Z79899 Other long term (current) drug therapy: Secondary | ICD-10-CM | POA: Insufficient documentation

## 2021-02-23 DIAGNOSIS — N1831 Chronic kidney disease, stage 3a: Secondary | ICD-10-CM

## 2021-02-23 DIAGNOSIS — I5032 Chronic diastolic (congestive) heart failure: Secondary | ICD-10-CM | POA: Diagnosis not present

## 2021-02-23 DIAGNOSIS — I272 Pulmonary hypertension, unspecified: Secondary | ICD-10-CM | POA: Diagnosis not present

## 2021-02-23 DIAGNOSIS — I130 Hypertensive heart and chronic kidney disease with heart failure and stage 1 through stage 4 chronic kidney disease, or unspecified chronic kidney disease: Secondary | ICD-10-CM | POA: Diagnosis not present

## 2021-02-23 DIAGNOSIS — Z8249 Family history of ischemic heart disease and other diseases of the circulatory system: Secondary | ICD-10-CM | POA: Diagnosis not present

## 2021-02-23 DIAGNOSIS — I4510 Unspecified right bundle-branch block: Secondary | ICD-10-CM | POA: Insufficient documentation

## 2021-02-23 DIAGNOSIS — Z8673 Personal history of transient ischemic attack (TIA), and cerebral infarction without residual deficits: Secondary | ICD-10-CM | POA: Diagnosis not present

## 2021-02-23 DIAGNOSIS — Z8572 Personal history of non-Hodgkin lymphomas: Secondary | ICD-10-CM | POA: Diagnosis not present

## 2021-02-23 DIAGNOSIS — N183 Chronic kidney disease, stage 3 unspecified: Secondary | ICD-10-CM | POA: Insufficient documentation

## 2021-02-23 DIAGNOSIS — I480 Paroxysmal atrial fibrillation: Secondary | ICD-10-CM | POA: Insufficient documentation

## 2021-02-23 DIAGNOSIS — Z87891 Personal history of nicotine dependence: Secondary | ICD-10-CM | POA: Insufficient documentation

## 2021-02-23 LAB — BASIC METABOLIC PANEL
Anion gap: 9 (ref 5–15)
BUN: 69 mg/dL — ABNORMAL HIGH (ref 8–23)
CO2: 31 mmol/L (ref 22–32)
Calcium: 7.8 mg/dL — ABNORMAL LOW (ref 8.9–10.3)
Chloride: 101 mmol/L (ref 98–111)
Creatinine, Ser: 1.44 mg/dL — ABNORMAL HIGH (ref 0.61–1.24)
GFR, Estimated: 47 mL/min — ABNORMAL LOW (ref >60)
Glucose, Bld: 105 mg/dL — ABNORMAL HIGH (ref 70–99)
Potassium: 4 mmol/L (ref 3.5–5.1)
Sodium: 141 mmol/L (ref 135–145)

## 2021-02-23 LAB — BRAIN NATRIURETIC PEPTIDE: B Natriuretic Peptide: 63.7 pg/mL (ref 0.0–100.0)

## 2021-02-23 NOTE — Patient Instructions (Signed)
Medication Changes:  Take a dose of Metolazone tomorrow morning  Lab Work:  Labs done today, your results will be available in MyChart, we will contact you for abnormal readings.   Testing/Procedures:  none  Referrals:  none  Special Instructions // Education:  none  Follow-Up in: 6 weeks  At the [facility], you and your health needs are our priority. We have a designated team specialized in the treatment of Heart Failure. This Care Team includes your primary Heart Failure Specialized Cardiologist (physician), Advanced Practice Providers (APPs- Physician Assistants and Nurse Practitioners), and Pharmacist who all work together to provide you with the care you need, when you need it.   You may see any of the following providers on your designated Care Team at your next follow up:  Dr Glori Bickers Dr Haynes Kerns, NP Lyda Jester, [place] [facility] [place], [place] Audry Riles, PharmD   Please be sure to bring in all your medications bottles to every appointment.   Need to Contact us:  If you have any questions or concerns before your next appointment please send [place] a message through [place] or call our office at [phone].    TO LEAVE A MESSAGE FOR THE NURSE SELECT OPTION 2, PLEASE LEAVE A MESSAGE INCLUDING: YOUR NAME DATE OF BIRTH CALL BACK NUMBER REASON FOR CALL**this is important as we prioritize the call backs  YOU WILL RECEIVE A CALL BACK THE SAME DAY AS LONG AS YOU CALL BEFORE 4:00 PM

## 2021-02-24 NOTE — Progress Notes (Signed)
PCP: Roetta Sessions, NP Cardiology: Dr. Aundra Dubin  88 y.o. with history of interstitial lung disease (?NSIP vs UIP) on home oxygen, pulmonary hypertension, diastolic CHF, and prior non-Hodgkins lymphoma (pelvic mass, s/p XRT) presents for followup of CHF and pulmonary hypertension.  Patient has history of reportedly mild ILD (NSIP vs UIP) followed by a pulmonologist at [facility] and pulmonary hypertension thought to be out of proportion lung disease followed by a physician at [facility].  Last CT chest was in 11/19, showing stable ILD (?UIP vs NSIP).  He had RHC in 2/20 with moderate PAH, PVR 5.1 WU.  He was tried on Adcirca but developed cognitive problems and worsening of peripheral edema so this was stopped.  He has been on home oxygen, uses at night and with exertion.    He was admitted in 6/20 with worsening dyspnea and peripheral edema, weight up despite compliance with torsemide 40 mg bid at home.  He has CKD stage 3 and torsemide was decreased in the spring. He was diuresed aggressively with fall in weight.  Echo showed normal LV size and systolic function, RV reportedly normal.  6/20 RHC showed normal filling pressures with moderate pulmonary hypertension, PVR 5.35 WU.   After discharge from hospital, patient started on ambrisentan.  His weight steadily began to increase and he developed worsening lower extremity edema, we stopped ambrisentan.   Patient was admitted in 8/20 with AKI after taking metolazone for several days.  He was admitted again in 8/20 with acute on chronic diastolic CHF. He was diuresed.  Wandering atrial pacemaker noted.   He was admitted in 3/21 with suspected CVA, MRI head showed punctate occipital lobe infarct, thought to be due to small vessel disease. ILR was implanted.  Echo in 3/21 showed EF 60-65%, severe LVH, normal RV.  PYP scan in 3/21 was not suggestive of TTR amyloidosis.  Subsequently, ILR has shown paroxysmal atrial fibrillation.   He was admitted later in  3/21 with AKI, creatinine up to 5.  However, he corrected rapidly to baseline with IVF and creatinine was 1.02 when discharge.   Echo in 6/22 showed EF 65-70% with mild LVH, normal RV, IVC normal, PASP estimated 29 mmHg.   Patient was admitted in 9/22 with upper GI bleed from bleeding gastric polyps.  He had polypectomy and has been restarted on Eliquis.   He was seen in the office 10/23/20 and BMET showed creatinine up to 4.18.  He was also noted to be Fe deficient.  Torsemide, metolazone, and spironolactone were stopped. Follow up he felt better. Planned to resume torsemide in a couple days, continued to hold spiro and metolazone. Feraheme infusions arranged.  He was seen in the ER with atrial fibrillation in 12/22.    Today he returns for HF followup.  Weight is down 5 lbs.  He still has significant lower extremity edema. He has not been wearing home oxygen.  He walks with a walker when outside his apartment.  Short of breath walking longer distances, no dyspnea walking around the house.  No BRBPR/melena.  No lightheadedness.  No chest pain.  He has generalized fatigue. He has been sleeping in a recliner for 3 years.  He has had a high sodium diet in recent days.    6 minute walk (8/21): 122 m 6 minute walk (11/21): 244 m 6 minute walk (3/22): 122 m  Labs (6/20): K 4.7, creatinine 1.46 Labs (7/20): K 3.1, creatinine 1.67 Labs (8/20): K 3.9, creatinine 1.8 Labs (9/20): K  4.9, creatinine 1.76, BNP 78 Labs (10/20): K 4.2, creatininine 1.5 Labs (11/20): BNP 36, K 4.3, creatinine 1.4 Labs (3/21): myeloma panel negative, urine immunofixation negative.  Labs (4/21): K 4.5, creatinine 1.02, hgb 11.4 Labs (6/21): K 5.1, creatinine 2.2, hgb 10.4 Labs (8/21): LDL 82 Labs (11/21): K 5.3, creatinine 2.11 Labs (3/22): K 3.9, creatinine 2.31 Labs (4/22): K 4.1, creatinine 2.15 Labs (9/22): hgb 8.6 => 9.1, creatinine 1.5 => 4.18, BUN 85, BNP 38.5 Labs (11/22): K 4.0, creatinine 1.7 Labs (12/22): BNP  140, K 3.7, creatinine 1.87, hgb 12.1  PMH: 1. Non-Hodgkin's lymphoma: Pelvic involvement.  S/p radiation.  Thought to be in remission (Dr. Benay Spice).  2. HTN 3. PACs/PVCs 4. GERD: Barrett's esophagus.  5. 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  - High resolution CT chest 11/19: ILD, ?UIP pattern.  6. Pulmonary hypertension: Thought to be out of proportion to underlying lung disease (ILD, ?NSIP vs UIP).   - RHC (2/20): mean RA 10, PASP 60 with mean 42, mean PCWP 11, CI 3, PVR 5.1 WU.  - Trial of Adcirca => failed due to ?memory worsening/cognitive affects and peripheral edema.  - Echo (6/20): EF 60-65%, mild LVH, RV reportedly normal, PASP 59 mmHg.  - RHC (6/20): mean RA 7, PA 50/24 mean 34, mean PCWP 12, CI 2.01, PAPI 3.7, PVR 5.35 WU - RF negative, ANA negative, anti-SCL70 negative, anti-centromere negative.  - V/Q scan (8/20): No evidence for chronic PE.  - Echo (3/21): EF 60-65%, severe LVH, normal RV.  - Echo (6/22): EF 65-70% with mild LVH, normal RV, IVC normal, PASP estimated 29 mmHg. 7. CKD: Stage 3.   8. Wandering atrial pacemaker 9. OSA 10. PYP scan (3/21): H/CL ratio 1.0, grade 0.  11. CVA: MRI 3/21 with punctate occipital lobe infarct thought to be due to small vessel disease. - Carotid dopplers (3/21): 40-59% RICA stenosis.  - Carotid dopplers (4/22): 40-59% RICA stenosis.  12. Atrial fibrillation: Paroxysmal, noted on ILR.  13. GI bleeding 9/22 from gastric polyps.   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  Tobacco Use   Smoking status: Former    Packs/day: 1.00    Years: 35.00    Pack years: 35.00    Types: Pipe, Cigarettes    Quit date: 02/23/1992    Years since quitting: 29.0   Smokeless tobacco: Never   Tobacco comments:     quit 20 years ago  Vaping Use   Vaping Use: Never used  Substance and Sexual Activity   Alcohol use: Not Currently    Comment: 2 drinks daily scotch  AND WINE WITH SUPPER   Drug use: No   Sexual activity: Not on file  Other Topics Concern   Not on file  Social History Narrative   Married - second marriage   He has two children   Retired Horticulturist, commercial Professor   Currently teaches pottery making, 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[facility]   Former Smoker quit 12 years ago- smoked for 35 years   Alcohol use- not currently   Right handed   Social Determinants of Radio broadcast assistant Strain: Not on file  Food Insecurity: Not on file  Transportation Needs: Not on file  Physical Activity: Not on file  Stress: Not on file  Social Connections: Not on file  Intimate Partner Violence: Not on file   Family History  Problem Relation Age of Onset   Dementia Mother    Heart disease Father        MI 31   Urticaria Father    Dementia Sister    Parkinson's disease Sister    ALS Sister    Prostate cancer Brother    Prostate cancer Paternal Uncle    Prostate cancer Paternal Uncle    Prostate cancer Paternal Uncle    Hyperlipidemia Other    Stroke Other    Hypertension Other    ADD / ADHD Other    Colon cancer Neg Hx    Allergic rhinitis Neg Hx    Asthma Neg Hx    Eczema Neg Hx    ROS: All systems reviewed and negative except as per HPI.   Current Outpatient Medications  Medication Sig Dispense Refill   acetaminophen (TYLENOL) 500 MG tablet Take 500 mg by mouth every 6 (six) hours as needed for mild pain or headache.     apixaban (ELIQUIS) 2.5 MG TABS tablet Take 1 tablet (2.5 mg total) by mouth 2 (two) times daily. 60 tablet 11   atorvastatin (LIPITOR) 40 MG tablet Take 1 tablet (40 mg total) by mouth daily at 6 PM. 30 tablet 1   cyanocobalamin (,VITAMIN B-12,) 1000 MCG/ML injection Inject 1,000 mcg into the muscle every 30 (thirty) days.     escitalopram (LEXAPRO) 10 MG tablet  Take 10 mg by mouth daily.     finasteride (PROSCAR) 5 MG tablet Take 5 mg by mouth daily.     FLONASE SENSIMIST 27.5 MCG/SPRAY nasal spray Place 1 spray into the nose daily as needed for rhinitis or allergies.      Iron, Ferrous Sulfate, 325 (65 Fe) MG TABS Take 325 mg by mouth daily. 30 tablet 4   Melatonin 10 MG TABS Take 10 mg by mouth at bedtime.     metolazone (ZAROXOLYN) 2.5 MG tablet Take 2.5 mg by mouth. Every 10 days     metoprolol succinate (TOPROL XL) 25 MG 24 hr tablet Take 0.5 tablets (12.5 mg total) by mouth at bedtime. 15 tablet 3   NON FORMULARY Take 1 tablet by mouth See admin instructions. Unnamed laxative: Take 1 tablet by mouth at bedtime as needed for iron-induced constipation     OXYGEN Inhale 2 L/min into the lungs. As needed and at night     pantoprazole (PROTONIX) 40 MG tablet Take 1 tablet (40 mg total) by mouth 2 (two) times daily. Take twice daily for 8 weeks and then change in dosing to once daily. 120 tablet 1   potassium chloride SA (KLOR-CON) 20 MEQ tablet Take 1 tablet (20 mEq total) by mouth once a week. (Patient taking differently: Take 20 mEq by mouth every 14 (fourteen) days.) 5 tablet 3   Riociguat (ADEMPAS) 2.5 MG TABS Take 2.5 mg by mouth 3 (three) times daily. 90 tablet 11   rOPINIRole (REQUIP) 2 MG tablet Take 1 tablet (2 mg total) by mouth 3 (three) times daily. 90 tablet 4   torsemide (DEMADEX) 100 MG tablet Take 1 tablet (100 mg total) by mouth 2 (two) times daily. 180 tablet 3   traMADol (ULTRAM) 50 MG tablet Take 50 mg by mouth every 6 (six) hours. As needed     traZODone (DESYREL) 100 MG tablet Take 100 mg by mouth  at bedtime.     UPTRAVI 1600 MCG TABS Take 1,600 mcg by mouth 2 (two) times daily.     UPTRAVI 200 MCG TABS Take 400 mcg by mouth 2 (two) times daily.     No current facility-administered medications for this encounter.   BP (!) 86/52    Pulse 94    Wt 80.6 kg (177 lb 9.6 oz)    SpO2 (!) 89%    BMI 27.82 kg/m   Wt Readings from Last  3 Encounters:  02/23/21 80.6 kg (177 lb 9.6 oz)  01/21/21 77.6 kg (171 lb)  12/31/20 82.6 kg (182 lb)   General: NAD Neck: JVP 8 cm with HJR, no thyromegaly or thyroid nodule.  Lungs: Clear to auscultation bilaterally with normal respiratory effort. CV: Nondisplaced PMI.  Heart regular S1/S2, no S3/S4, 2/6 early SEM RUSB.  2+ ankle edema.  No carotid bruit.  Difficult to palpate pedal pulses due to edema.  Abdomen: Soft, nontender, no hepatosplenomegaly, no distention.  Skin: Intact without lesions or rashes.  Neurologic: Alert and oriented x 3.  Psych: Normal affect. Extremities: No clubbing or cyanosis.  HEENT: Normal.   Assessment/Plan: 1.Chronic diastolic CHF: Suspect with significant component of RV failure from pulmonary hypertension.  Echo in 6/20 with EF 60-65%, RV not well-visualized but PASP 59 mmHg.  Echo (3/21) with EF 60-65%, severe LVH, normal RV, PASP 28 mmHg.  PYP scan was not suggestive of TTR cardiac amyloidosis. Echo in 6/22 showed EF 65-70% with mild LVH, normal RV, IVC normal, PASP estimated 29 mmHg.  Stable NYHA class III symptoms. He looks mildly volume overloaded on exam today, has had more sodium in his diet recently.  - I will have him take an extra metolazone tomorrow morning.  He will continue metolazone every 10 days or so after that.  BMET/BNP today. - Continue torsemide 100 mg bid. - Elevate legs when at rest. - Unable to tolerate compression stockings.  2. CKD Stage 3: BMET today. 3. Pulmonary hypertension: Patient thought to have [place] out of proportion to his lung disease (ILD, NSIP versus UIP). RHC in 2/20 showed mean PA pressure 41, PVR 5.1 WU, CI 3. Echo in 6/20 with RV reportedly ok => I reviewed and do not think that RV was well-visualized.  Patient did not tolerate Adcirca due to edema/?cognitive effects.  RHC was done again in 6/20, showed moderate pulmonary arterial hypertension with PVR 5.35 (comparable to prior).  Serological workup for PH was  negative. Patient started ambrisentan 5 mg and developed significant edema/volume overload despite a high dose of torsemide. He is now on Uptravi + riociguat.  Most recent echo in 6/22 showed normal RV size/systolic function and PA systolic pressure estimate was normal.   - he will stay off ambrisentan (? trigger for volume overload).  - As above, he did not tolerate tadalafil.  - He is at 2000 mcg bid Uptravi.  With excellent echo in 6/22, will keep at this dose.   - Continue riociguat at goal dose 2.5 mg tid.  4. Atrial fibrillation: Paroxysmal, noted on ILR. Regular on exam today.  - Continue apixaban 2.5 mg bid (dosed for age, renal dysfunction). Denies bleeding. CBC today.  5. OSA: Cannot tolerate CPAP. I encouraged him to wear his oxygen. 6. CVA: In 3/21, may have been atrial fibrillation-related.  On apixaban now.  7. Carotid stenosis: 40-59% RICA stenosis on 4/22 dopplers.  - Repeat 4/23.    8. Interstitial lung disease:  Has seen pulmonary,  ILD seems mild and stable.  9. Fe deficiency: H/o GI bleeding. Followed by GI.  - CBC today.   Follow up 6 wks  Loralie Champagne MD 02/24/2021

## 2021-03-02 ENCOUNTER — Ambulatory Visit (INDEPENDENT_AMBULATORY_CARE_PROVIDER_SITE_OTHER): Payer: Medicare HMO

## 2021-03-02 DIAGNOSIS — Z8673 Personal history of transient ischemic attack (TIA), and cerebral infarction without residual deficits: Secondary | ICD-10-CM

## 2021-03-02 LAB — CUP PACEART REMOTE DEVICE CHECK
Date Time Interrogation Session: 20230130110119
Implantable Pulse Generator Implant Date: 20210325

## 2021-03-09 ENCOUNTER — Telehealth: Payer: Self-pay | Admitting: Internal Medicine

## 2021-03-09 DIAGNOSIS — K2960 Other gastritis without bleeding: Secondary | ICD-10-CM

## 2021-03-09 NOTE — Telephone Encounter (Signed)
Patients wife called and stated that she wanted to see if there is anyway that patient could go back on Prilosec. Seeking advice, please advise.

## 2021-03-09 NOTE — Telephone Encounter (Signed)
Called pt to inquire further. Unable to LM d/t VM being full. From review of last OV, appears Dr. Lorenso Courier advised pt continue PPI that had been previously Rx'd and did not change to an alternative. Wanted to inquire further why the change had been made in the past from Omeprazole to Pantoprazole. Will continue efforts to contact pt.

## 2021-03-10 NOTE — Telephone Encounter (Signed)
SECOND ATTEMPT:  Called to inquire further about request. Confirmed another provider had Rx'd Omeprazole in the past and confirmed Rx had been changed to Pantoprazole 10/2020 d/t worsening reflux. Confirmed his reflux is the same and at times is worse than when he was taking Omeprazole. Advised it is possible he may require an added medication to the Pantoprazole rather than Rx'ing a medication that had not been beneficial in the past. Advised I will forward this request and concern to Dr. Lorenso Courier for further recommendations.

## 2021-03-10 NOTE — Progress Notes (Signed)
Carelink Summary Report / Loop Recorder 

## 2021-03-12 MED ORDER — FAMOTIDINE 20 MG PO TABS: 20.0000 mg | ORAL_TABLET | Freq: Every day | ORAL | 2 refills | Status: DC

## 2021-03-12 NOTE — Telephone Encounter (Signed)
Called pt to inform about Dr. Libby Maw recommendations as documented below. Declined to change Rx to Omeprazole but would rather keep the Pantoprazole with the additional Pepcid Rx to see if this will help control symptoms. Pepcid sent to pt preferred pharmacy. Pt states he will call to change Pantoprazole to Omeprazole IF he does not see any changes to his symptoms.

## 2021-03-25 NOTE — Progress Notes (Signed)
PCP: Roetta Sessions, NP Cardiology: Dr. Aundra Dubin  88 y.o. with history of interstitial lung disease (?NSIP vs UIP) on home oxygen, pulmonary hypertension, diastolic CHF, and prior non-Hodgkins lymphoma (pelvic mass, s/p XRT) presents for followup of CHF and pulmonary hypertension.  Patient has history of reportedly mild ILD (NSIP vs UIP) followed by a pulmonologist at [facility] and pulmonary hypertension thought to be out of proportion lung disease followed by a physician at [facility].  Last CT chest was in 11/19, showing stable ILD (?UIP vs NSIP).  He had RHC in 2/20 with moderate PAH, PVR 5.1 WU.  He was tried on Adcirca but developed cognitive problems and worsening of peripheral edema so this was stopped.  He has been on home oxygen, uses at night and with exertion.    He was admitted in 6/20 with worsening dyspnea and peripheral edema, weight up despite compliance with torsemide 40 mg bid at home.  He has CKD stage 3 and torsemide was decreased in the spring. He was diuresed aggressively with fall in weight.  Echo showed normal LV size and systolic function, RV reportedly normal.  6/20 RHC showed normal filling pressures with moderate pulmonary hypertension, PVR 5.35 WU.   After discharge from hospital, patient started on ambrisentan.  His weight steadily began to increase and he developed worsening lower extremity edema, we stopped ambrisentan.   Patient was admitted in 8/20 with AKI after taking metolazone for several days.  He was admitted again in 8/20 with acute on chronic diastolic CHF. He was diuresed.  Wandering atrial pacemaker noted.   He was admitted in 3/21 with suspected CVA, MRI head showed punctate occipital lobe infarct, thought to be due to small vessel disease. ILR was implanted.  Echo in 3/21 showed EF 60-65%, severe LVH, normal RV.  PYP scan in 3/21 was not suggestive of TTR amyloidosis.  Subsequently, ILR has shown paroxysmal atrial fibrillation.   He was admitted later in  3/21 with AKI, creatinine up to 5.  However, he corrected rapidly to baseline with IVF and creatinine was 1.02 when discharge.   Echo in 6/22 showed EF 65-70% with mild LVH, normal RV, IVC normal, PASP estimated 29 mmHg.   Patient was admitted in 9/22 with upper GI bleed from bleeding gastric polyps.  He had polypectomy and has been restarted on Eliquis.   He was seen in the office 10/23/20 and BMET showed creatinine up to 4.18.  He was also noted to be Fe deficient.  Torsemide, metolazone, and spironolactone were stopped. Follow up he felt better. Planned to resume torsemide in a couple days, continued to hold spiro and metolazone. Feraheme infusions arranged.  He was seen in the ER with atrial fibrillation in 12/22.    Follow up 02/23/21, volume was mildly elevated and instructed to take an extra dose of metolazone that week.   Today he returns for HF follow up with his wife. Lethargy is better after starting iron replacement. He is sleeping a lot but wakes hourly at night to urinate. He uses a walker when outside of his apartment, and remains SOB walking longer distances. Denies abnormal bleeding, palpations, CP, dizziness. Leg swelling is better. Chronically sleeps in a recliner. Appetite ok. No fever or chills. Weight at home 172 pounds. Taking metolazone every 7-10 days or so. Started drinking protein drinks daily.  ECG (personally reviewed): NSR w/ PVCs  6 minute walk (8/21): 122 m 6 minute walk (11/21): 244 m 6 minute walk (3/22): 122 m  Labs (6/20): K 4.7, creatinine 1.46 Labs (7/20): K 3.1, creatinine 1.67 Labs (8/20): K 3.9, creatinine 1.8 Labs (9/20): K 4.9, creatinine 1.76, BNP 78 Labs (10/20): K 4.2, creatininine 1.5 Labs (11/20): BNP 36, K 4.3, creatinine 1.4 Labs (3/21): myeloma panel negative, urine immunofixation negative.  Labs (4/21): K 4.5, creatinine 1.02, hgb 11.4 Labs (6/21): K 5.1, creatinine 2.2, hgb 10.4 Labs (8/21): LDL 82 Labs (11/21): K 5.3, creatinine  2.11 Labs (3/22): K 3.9, creatinine 2.31 Labs (4/22): K 4.1, creatinine 2.15 Labs (9/22): hgb 8.6 => 9.1, creatinine 1.5 => 4.18, BUN 85, BNP 38.5 Labs (11/22): K 4.0, creatinine 1.7 Labs (12/22): BNP 140, K 3.7, creatinine 1.87, hgb 12.1 Labs (1/23): K 4.0, creatinine 1.44  PMH: 1. Non-Hodgkin's lymphoma: Pelvic involvement.  S/p radiation.  Thought to be in remission (Dr. Benay Spice).  2. HTN 3. PACs/PVCs 4. GERD: Barrett's esophagus.  5. 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  - High resolution CT chest 11/19: ILD, ?UIP pattern.  6. Pulmonary hypertension: Thought to be out of proportion to underlying lung disease (ILD, ?NSIP vs UIP).   - RHC (2/20): mean RA 10, PASP 60 with mean 42, mean PCWP 11, CI 3, PVR 5.1 WU.  - Trial of Adcirca => failed due to ?memory worsening/cognitive affects and peripheral edema.  - Echo (6/20): EF 60-65%, mild LVH, RV reportedly normal, PASP 59 mmHg.  - RHC (6/20): mean RA 7, PA 50/24 mean 34, mean PCWP 12, CI 2.01, PAPI 3.7, PVR 5.35 WU - RF negative, ANA negative, anti-SCL70 negative, anti-centromere negative.  - V/Q scan (8/20): No evidence for chronic PE.  - Echo (3/21): EF 60-65%, severe LVH, normal RV.  - Echo (6/22): EF 65-70% with mild LVH, normal RV, IVC normal, PASP estimated 29 mmHg. 7. CKD: Stage 3.   8. Wandering atrial pacemaker 9. OSA 10. PYP scan (3/21): H/CL ratio 1.0, grade 0.  11. CVA: MRI 3/21 with punctate occipital lobe infarct thought to be due to small vessel disease. - Carotid dopplers (3/21): 40-59% RICA stenosis.  - Carotid dopplers (4/22): 40-59% RICA stenosis.  12. Atrial fibrillation: Paroxysmal, noted on ILR.  13. GI bleeding 9/22 from gastric polyps.   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[employer]  Tobacco Use   Smoking status: Former    Packs/day: 1.00    Years: 35.00    Pack years: 35.00    Types: Pipe, Cigarettes    Quit date: 02/23/1992    Years since quitting: 29.1   Smokeless tobacco: Never   Tobacco comments:    quit 20 years ago  Vaping Use   Vaping Use: Never used  Substance and Sexual Activity   Alcohol use: Not Currently    Comment: 2 drinks daily scotch  AND WINE WITH SUPPER   Drug use: No   Sexual activity: Not on file  Other Topics Concern   Not on file  Social History Narrative   Married - second marriage   He has two children   Retired Horticulturist, commercial Professor   Currently teaches pottery making, 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[facility]   Former Smoker quit 12 years ago- smoked for 35 years   Alcohol use- not currently   Right  handed   Social Determinants of Health   Financial Resource Strain: Not on file  Food Insecurity: Not on file  Transportation Needs: Not on file  Physical Activity: Not on file  Stress: Not on file  Social Connections: Not on file  Intimate Partner Violence: Not on file   Family History  Problem Relation Age of Onset   Dementia Mother    Heart disease Father        MI 59   Urticaria Father    Dementia Sister    Parkinson's disease Sister    ALS Sister    Prostate cancer Brother    Prostate cancer Paternal Uncle    Prostate cancer Paternal Uncle    Prostate cancer Paternal Uncle    Hyperlipidemia Other    Stroke Other    Hypertension Other    ADD / ADHD Other    Colon cancer Neg Hx    Allergic rhinitis Neg Hx    Asthma Neg Hx    Eczema Neg Hx    ROS: All systems reviewed and negative except as per HPI.   Current Outpatient Medications  Medication Sig Dispense Refill   acetaminophen (TYLENOL) 325 MG tablet Take 325 mg by mouth in the morning, at noon, and at bedtime.     apixaban (ELIQUIS) 2.5 MG TABS tablet Take 1 tablet (2.5 mg total) by mouth 2 (two) times daily. 60 tablet 11   atorvastatin (LIPITOR)  40 MG tablet Take 1 tablet (40 mg total) by mouth daily at 6 PM. 30 tablet 1   cyanocobalamin (,VITAMIN B-12,) 1000 MCG/ML injection Inject 1,000 mcg into the muscle every 30 (thirty) days.     escitalopram (LEXAPRO) 10 MG tablet Take 10 mg by mouth daily.     famotidine (PEPCID) 20 MG tablet Take 1 tablet (20 mg total) by mouth at bedtime. 30 tablet 2   finasteride (PROSCAR) 5 MG tablet Take 5 mg by mouth daily.     FLONASE SENSIMIST 27.5 MCG/SPRAY nasal spray Place 1 spray into the nose daily as needed for rhinitis or allergies.      Iron, Ferrous Sulfate, 325 (65 Fe) MG TABS Take 325 mg by mouth daily. 30 tablet 4   Melatonin 10 MG TABS Take 10 mg by mouth at bedtime.     metolazone (ZAROXOLYN) 2.5 MG tablet Take 2.5 mg by mouth. Every 10 days. Pt taking 1 tablet every 7-10 days     metoprolol succinate (TOPROL XL) 25 MG 24 hr tablet Take 0.5 tablets (12.5 mg total) by mouth at bedtime. 15 tablet 3   NON FORMULARY Take 1 tablet by mouth See admin instructions. Unnamed laxative: Take 1 tablet by mouth at bedtime as needed for iron-induced constipation     pantoprazole (PROTONIX) 40 MG tablet Take 1 tablet (40 mg total) by mouth 2 (two) times daily. Take twice daily for 8 weeks and then change in dosing to once daily. 120 tablet 1   potassium chloride SA (KLOR-CON) 20 MEQ tablet Take 1 tablet (20 mEq total) by mouth once a week. (Patient taking differently: Take 20 mEq by mouth as directed. Takes when he take metolazone) 5 tablet 3   Riociguat (ADEMPAS) 2.5 MG TABS Take 2.5 mg by mouth 3 (three) times daily. 90 tablet 11   rOPINIRole (REQUIP) 2 MG tablet Take 1 tablet (2 mg total) by mouth 3 (three) times daily. 90 tablet 4   torsemide (DEMADEX) 100 MG tablet Take 1 tablet (100 mg total) by mouth  2 (two) times daily. 180 tablet 3   traMADol (ULTRAM) 50 MG tablet Take 50 mg by mouth every 6 (six) hours. As needed     traZODone (DESYREL) 100 MG tablet Take 50 mg by mouth at bedtime.     UPTRAVI 1600  MCG TABS Take 1,600 mcg by mouth 2 (two) times daily.     UPTRAVI 200 MCG TABS Take 400 mcg by mouth 2 (two) times daily.     OXYGEN Inhale 2 L/min into the lungs. As needed and at night (Patient not taking: Reported on 03/30/2021)     No current facility-administered medications for this encounter.   BP (!) 116/58    Pulse 77    Ht _0  (1.702 m)    Wt 78.2 kg (172 lb 6.4 oz)    SpO2 95%    BMI 27.00 kg/m   Wt Readings from Last 3 Encounters:  03/30/21 78.2 kg (172 lb 6.4 oz)  02/23/21 80.6 kg (177 lb 9.6 oz)  01/21/21 77.6 kg (171 lb)   General:  NAD. No resp difficulty, walked into clinic with walker. HEENT: Normal Neck: Supple. No JVD. Carotids 2+ bilat; no bruits. No lymphadenopathy or thryomegaly appreciated. Cor: PMI nondisplaced. Regular rate & rhythm. No rubs, gallops, 2/6 SEM RUSB Lungs: Clear Abdomen: Soft, nontender, nondistended. No hepatosplenomegaly. No bruits or masses. Good bowel sounds. Extremities: No cyanosis, clubbing, rash, 1-2+ BLE pre-tibial edema Neuro: Alert & oriented x 3, cranial nerves grossly intact. Moves all 4 extremities w/o difficulty. Affect pleasant.  Assessment/Plan: 1.Chronic diastolic CHF: Suspect with significant component of RV failure from pulmonary hypertension.  Echo in 6/20 with EF 60-65%, RV not well-visualized but PASP 59 mmHg.  Echo (3/21) with EF 60-65%, severe LVH, normal RV, PASP 28 mmHg.  PYP scan was not suggestive of TTR cardiac amyloidosis. Echo in 6/22 showed EF 65-70% with mild LVH, normal RV, IVC normal, PASP estimated 29 mmHg.  Stable NYHA class III symptoms. He is not overloaded on exam today, weight down 5 lbs. Leg edema looks much better - Continue metolazone every 7-10 days or so.  BMET/BNP today. - Continue torsemide 100 mg bid. Discussed taking afternoon dose around 3pm to decrease nocturia. - Continue Toprol XL 12.5 mg nightly. - Elevate legs when at rest. - Unable to tolerate compression stockings.  2. CKD Stage 3: BMET  today. 3. Pulmonary hypertension: Patient thought to have [place] out of proportion to his lung disease (ILD, NSIP versus UIP). RHC in 2/20 showed mean PA pressure 41, PVR 5.1 WU, CI 3. Echo in 6/20 with RV reportedly ok => I reviewed and do not think that RV was well-visualized.  Patient did not tolerate Adcirca due to edema/?cognitive effects.  RHC was done again in 6/20, showed moderate pulmonary arterial hypertension with PVR 5.35 (comparable to prior).  Serological workup for PH was negative. Patient started ambrisentan 5 mg and developed significant edema/volume overload despite a high dose of torsemide. He is now on Uptravi + riociguat.  Most recent echo in 6/22 showed normal RV size/systolic function and PA systolic pressure estimate was normal.   - He will stay off ambrisentan (? trigger for volume overload).  - As above, he did not tolerate tadalafil.  - He is at 2000 mcg bid Uptravi.  With excellent echo in 6/22, will keep at this dose.   - Continue riociguat at goal dose 2.5 mg tid.  4. Atrial fibrillation: Paroxysmal, noted on ILR. NSR on ECG today.  - Continue  apixaban 2.5 mg bid (dosed for age, renal dysfunction). Denies bleeding. Recent CBC stable.  5. OSA: Cannot tolerate CPAP. I encouraged him to wear his oxygen. 6. CVA: In 3/21, may have been atrial fibrillation-related.  On apixaban now.  7. Carotid stenosis: 40-59% RICA stenosis on 4/22 dopplers.  - Repeat 4/23.    8. Interstitial lung disease:  Has seen pulmonary, ILD seems mild and stable.  9. Fe deficiency: H/o GI bleeding. Followed by GI.  - No abnormal bleeding.  Follow up in 2 months with APP and 4 months with Dr. Aundra Dubin.  [place] FNP 03/30/2021

## 2021-03-30 ENCOUNTER — Other Ambulatory Visit (HOSPITAL_COMMUNITY): Payer: Self-pay | Admitting: Cardiology

## 2021-03-30 ENCOUNTER — Ambulatory Visit (HOSPITAL_COMMUNITY)
Admission: RE | Admit: 2021-03-30 | Discharge: 2021-03-30 | Disposition: A | Discharge status: Home / Self Care | Payer: Medicare HMO | Source: Ambulatory Visit | Attending: Family Medicine | Admitting: Family Medicine

## 2021-03-30 ENCOUNTER — Other Ambulatory Visit: Payer: Self-pay

## 2021-03-30 ENCOUNTER — Encounter (HOSPITAL_COMMUNITY): Payer: Self-pay

## 2021-03-30 VITALS — BP 116/58 | HR 77 | Ht 67.0 in | Wt 172.4 lb

## 2021-03-30 DIAGNOSIS — Z8719 Personal history of other diseases of the digestive system: Secondary | ICD-10-CM | POA: Insufficient documentation

## 2021-03-30 DIAGNOSIS — Z923 Personal history of irradiation: Secondary | ICD-10-CM | POA: Insufficient documentation

## 2021-03-30 DIAGNOSIS — Z79899 Other long term (current) drug therapy: Secondary | ICD-10-CM | POA: Diagnosis not present

## 2021-03-30 DIAGNOSIS — J8410 Pulmonary fibrosis, unspecified: Secondary | ICD-10-CM | POA: Diagnosis not present

## 2021-03-30 DIAGNOSIS — I4510 Unspecified right bundle-branch block: Secondary | ICD-10-CM | POA: Diagnosis not present

## 2021-03-30 DIAGNOSIS — I130 Hypertensive heart and chronic kidney disease with heart failure and stage 1 through stage 4 chronic kidney disease, or unspecified chronic kidney disease: Secondary | ICD-10-CM | POA: Insufficient documentation

## 2021-03-30 DIAGNOSIS — I480 Paroxysmal atrial fibrillation: Secondary | ICD-10-CM | POA: Diagnosis not present

## 2021-03-30 DIAGNOSIS — D500 Iron deficiency anemia secondary to blood loss (chronic): Secondary | ICD-10-CM | POA: Insufficient documentation

## 2021-03-30 DIAGNOSIS — I5032 Chronic diastolic (congestive) heart failure: Secondary | ICD-10-CM | POA: Diagnosis not present

## 2021-03-30 DIAGNOSIS — I2721 Secondary pulmonary arterial hypertension: Secondary | ICD-10-CM | POA: Diagnosis not present

## 2021-03-30 DIAGNOSIS — I491 Atrial premature depolarization: Secondary | ICD-10-CM | POA: Insufficient documentation

## 2021-03-30 DIAGNOSIS — Z8572 Personal history of non-Hodgkin lymphomas: Secondary | ICD-10-CM | POA: Diagnosis not present

## 2021-03-30 DIAGNOSIS — Z7901 Long term (current) use of anticoagulants: Secondary | ICD-10-CM | POA: Insufficient documentation

## 2021-03-30 DIAGNOSIS — N1831 Chronic kidney disease, stage 3a: Secondary | ICD-10-CM | POA: Diagnosis not present

## 2021-03-30 DIAGNOSIS — I6521 Occlusion and stenosis of right carotid artery: Secondary | ICD-10-CM | POA: Diagnosis not present

## 2021-03-30 DIAGNOSIS — R351 Nocturia: Secondary | ICD-10-CM | POA: Insufficient documentation

## 2021-03-30 DIAGNOSIS — N183 Chronic kidney disease, stage 3 unspecified: Secondary | ICD-10-CM | POA: Insufficient documentation

## 2021-03-30 DIAGNOSIS — J849 Interstitial pulmonary disease, unspecified: Secondary | ICD-10-CM

## 2021-03-30 DIAGNOSIS — Z9981 Dependence on supplemental oxygen: Secondary | ICD-10-CM | POA: Diagnosis not present

## 2021-03-30 DIAGNOSIS — G4733 Obstructive sleep apnea (adult) (pediatric): Secondary | ICD-10-CM | POA: Diagnosis not present

## 2021-03-30 DIAGNOSIS — Z8673 Personal history of transient ischemic attack (TIA), and cerebral infarction without residual deficits: Secondary | ICD-10-CM | POA: Diagnosis not present

## 2021-03-30 DIAGNOSIS — I6523 Occlusion and stenosis of bilateral carotid arteries: Secondary | ICD-10-CM

## 2021-03-30 DIAGNOSIS — I272 Pulmonary hypertension, unspecified: Secondary | ICD-10-CM | POA: Diagnosis not present

## 2021-03-30 DIAGNOSIS — D509 Iron deficiency anemia, unspecified: Secondary | ICD-10-CM

## 2021-03-30 DIAGNOSIS — I493 Ventricular premature depolarization: Secondary | ICD-10-CM | POA: Diagnosis not present

## 2021-03-30 LAB — BRAIN NATRIURETIC PEPTIDE: B Natriuretic Peptide: 74.7 pg/mL (ref 0.0–100.0)

## 2021-03-30 NOTE — Patient Instructions (Addendum)
Thank you for coming in today  Labs were done today, if any labs are abnormal the clinic will call you   Take Evening dose of Torsemide around 3pm or 4pm no later than 4 pm to prevent night time urination.  Your physician recommends that you schedule a follow-up appointment in:  2 months in clinic  4 months with Dr. Aundra Dubin  At the [facility], you and your health needs are our priority. As part of our continuing mission to provide you with exceptional heart care, we have created designated Provider Care Teams. These Care Teams include your primary Cardiologist (physician) and Advanced Practice Providers (APPs- Physician Assistants and Nurse Practitioners) who all work together to provide you with the care you need, when you need it.   You may see any of the following providers on your designated Care Team at your next follow up: Dr Glori Bickers Dr Haynes Kerns, NP Lyda Jester, [place] [facility], [place] Audry Riles, PharmD   Please be sure to bring in all your medications bottles to every appointment.   If you have any questions or concerns before your next appointment please send [place] a message through [place] or call our office at [phone].    TO LEAVE A MESSAGE FOR THE NURSE SELECT OPTION 2, PLEASE LEAVE A MESSAGE INCLUDING: YOUR NAME DATE OF BIRTH CALL BACK NUMBER REASON FOR CALL**this is important as we prioritize the call backs  YOU WILL RECEIVE A CALL BACK THE SAME DAY AS LONG AS YOU CALL BEFORE 4:00 PM

## 2021-04-04 ENCOUNTER — Other Ambulatory Visit: Payer: Self-pay | Admitting: Adult Health

## 2021-04-06 ENCOUNTER — Ambulatory Visit (INDEPENDENT_AMBULATORY_CARE_PROVIDER_SITE_OTHER): Payer: Medicare HMO

## 2021-04-06 ENCOUNTER — Telehealth: Payer: Self-pay | Admitting: Adult Health

## 2021-04-06 DIAGNOSIS — Z8673 Personal history of transient ischemic attack (TIA), and cerebral infarction without residual deficits: Secondary | ICD-10-CM | POA: Diagnosis not present

## 2021-04-06 LAB — CUP PACEART REMOTE DEVICE CHECK
Date Time Interrogation Session: 20230306114642
Implantable Pulse Generator Implant Date: 20210325

## 2021-04-06 MED ORDER — ROPINIROLE HCL 2 MG PO TABS: 2.0000 mg | ORAL_TABLET | Freq: Three times a day (TID) | ORAL | 4 refills | Status: DC

## 2021-04-06 NOTE — Telephone Encounter (Signed)
Pt request refill for rOPINIRole (REQUIP) 2 MG tablet at [facility] -  ?

## 2021-04-06 NOTE — Telephone Encounter (Signed)
Refill has been sent per pt's request.  ?

## 2021-04-16 NOTE — Progress Notes (Signed)
Carelink Summary Report / Loop Recorder ? ?

## 2021-04-29 ENCOUNTER — Encounter: Payer: Self-pay | Admitting: Adult Health

## 2021-04-29 ENCOUNTER — Ambulatory Visit (INDEPENDENT_AMBULATORY_CARE_PROVIDER_SITE_OTHER): Payer: Medicare HMO | Admitting: Adult Health

## 2021-04-29 ENCOUNTER — Other Ambulatory Visit: Payer: Self-pay

## 2021-04-29 VITALS — BP 94/53 | HR 90 | Ht 67.0 in | Wt 179.4 lb

## 2021-04-29 DIAGNOSIS — G2581 Restless legs syndrome: Secondary | ICD-10-CM

## 2021-04-29 NOTE — Progress Notes (Signed)
? ? ?PATIENT: Ryan [name] ?DOB: 1931-02-16 ? ?REASON FOR VISIT: follow up ?HISTORY FROM: patient ? ?Chief Complaint  ?Patient presents with  ? Follow-up  ?  Rm 19, alone  ? ? ? ?HISTORY OF PRESENT ILLNESS: ?04/29/21: ? ?Ryan [name] is an 88 year old male with a history of RLS. He returns today for follow-up. Reports that his symptoms are still manageable. Continues to space out my 7 hours hours,typically taking 3-3.5 tablets daily. Sleeping varies- states that he is lucky if he sleeps 2 hours straight before he needs to go to the bathroom. Iron infusion last year resulted in a reaction so it had to be stopped. Uses Iron tablets now.  Continues to see cardiology for congestive heart failure.  He returns today for an evaluation. ? ?  ? ?10/30/20: Ryan [name] is an 88 year old male with a history of restless leg syndrome.  He returns today for follow-up.  The patient states that Requip 2 mg 3 times a day is working well for him.  He tries to space his dosing out by 7 hours.  States that if he tries to go longer he has breakthrough symptoms.  Typically will have an attack between 6 PM and midnight.  This is typically when he takes his third tablet.  He reports on occasion he may have to take a 4th tablet for breakthrough pain but this is not common.  The patient was recently in the hospital for anemia.  He received blood transfusion while there.  He will receive an iron transfusion next week.  He returns today for an evaluation. ? ?06/25/20: Ryan [name] is an 88 year old male with a history of restless leg syndrome and chronic back pain.  He returns today for follow-up.  He is currently on Neupro 3 mg patch.  He was originally told to only take Requip 2 mg twice a day for breakthrough pain.  The patient was taking Requip 2 mg 3 times a day in addition to the Neupro patch.  Patient was advised to a telephone conversation that this was too much medication.  His dose of Requip was reduced to 0.5 mg twice a day.  He  remains on Neupro 3 mg patch.  The patient does not feel that Neupro has offered him much benefit.  He would like to go back to Requip. ? ?The patient's iron level and ferritin level was low in April.  Lab work was completed by Dr. Claris Gladden office.  According to notes the patient was placed to have an iron infusion however the patient states that he was not aware of this and has been taking oral iron supplements. ? ?Patient has a history of chronic back pain with previous surgery through Dr. Clarice Pole office.  He also reports that he was receiving injections with Dr. Ellene Route but did not find them beneficial.  He has not followed up with Dr. Ellene Route. ? ?He reports that he has approximately 2 restless leg attacks during the day or night.  He states that his legs feel jumpy and painful.  He also reports that during this time he notices some back pain.  Usually a hot shower offers him some benefit.  Reports that he is not sleeping well at night.  Returns today for an evaluation. ? ?04/14/20: Ryan [name] is an 88 year old male with a history of restless leg syndrome and chronic back pain.  He returns today for follow-up.  He reports that he has not been taking the Neupro consistently.  Reports  early on he would only place the patch on if he felt that he needed it.  He states for the last week he has been placing it consistently.  He takes Requip 2 mg up to 3 times a day.  He states that typically between 10 and 11 PM his symptoms will begin.  He describes it as a pins and needle sensation in the legs.  He states the hot shower used to help but it no longer does.  He states that the sensation is usually stop by 1 AM.  In the past he has been on gabapentin but was unable to tolerate it.  Patient is currently being monitored for his kidney function. ? ?12/10/19: Ryan [name] is an 88 year old male with a history of restless leg syndrome and chronic back pain.  He returns today for follow-up.  The patient has been taking Requip  and gabapentin but reports it does not offer him much benefit for his restless legs.  He states he can be up 3-4 times at night.  He also has symptoms throughout the day.  He states that gabapentin makes him very drowsy.  He has not taken it for the last 2 nights.  His B12 levels were low back in April.  He has been taking monthly B12 injections but has not had a shot in at least 2 months.  He returns today for an evaluation. ? ? ?HISTORY 10/18/19: ?  ?Ryan [name] is an 88 year old male with a history of restless leg syndrome, CVA and chronic back pain.  He returns today to discuss restless legs.  He states that he is currently taking Requip 2 mg 3 times a day.  He states that he tries to take them 8 hours apart.  He states that he has been waking up during the night with symptoms.  He states that this can occur anytime between 10 and 12 and sometimes will last till 4 in the morning.  He states that a hot shower is sometimes beneficial.  Initially tramadol offered him the most benefit but it no longer works.  He states that he does not have any symptoms during the day.  He returns today for an evaluation. ? ?REVIEW OF SYSTEMS: Out of a complete 14 system review of symptoms, the patient complains only of the following symptoms, and all other reviewed systems are negative. ? ?See HPI ? ?ALLERGIES: ?Allergies  ?Allergen Reactions  ? Pollen Extract-Tree Extract Other (See Comments)  ?  "HEADACHES, TIRED, DRAINAGE FROM SINUSES"  ? Feraheme [Ferumoxytol] Other (See Comments)  ?  Chest pain, facial flushing, shortness of breath  ? Molds & Smuts Other (See Comments)  ?  Also dust mites causes sinus infections, h/a etc.  ? ? ?HOME MEDICATIONS: ?Outpatient Medications Prior to Visit  ?Medication Sig Dispense Refill  ? acetaminophen (TYLENOL) 325 MG tablet Take 325 mg by mouth in the morning, at noon, and at bedtime.    ? apixaban (ELIQUIS) 2.5 MG TABS tablet Take 1 tablet (2.5 mg total) by mouth 2 (two) times daily. 60 tablet  11  ? atorvastatin (LIPITOR) 40 MG tablet Take 1 tablet (40 mg total) by mouth daily at 6 PM. 30 tablet 1  ? cyanocobalamin (,VITAMIN B-12,) 1000 MCG/ML injection Inject 1,000 mcg into the muscle every 30 (thirty) days.    ? escitalopram (LEXAPRO) 10 MG tablet Take 10 mg by mouth daily.    ? famotidine (PEPCID) 20 MG tablet Take 1 tablet (20 mg total) by mouth  at bedtime. 30 tablet 2  ? finasteride (PROSCAR) 5 MG tablet Take 5 mg by mouth daily.    ? FLONASE SENSIMIST 27.5 MCG/SPRAY nasal spray Place 1 spray into the nose daily as needed for rhinitis or allergies.     ? Iron, Ferrous Sulfate, 325 (65 Fe) MG TABS Take 325 mg by mouth daily. 30 tablet 4  ? Melatonin 10 MG TABS Take 10 mg by mouth at bedtime.    ? metolazone (ZAROXOLYN) 2.5 MG tablet Take 2.5 mg by mouth. Every 10 days. Pt taking 1 tablet every 7-10 days    ? metoprolol succinate (TOPROL-XL) 25 MG 24 hr tablet TAKE ONE-HALF TABLET AT BEDTIME 15 tablet 3  ? NON FORMULARY Take 1 tablet by mouth See admin instructions. Unnamed laxative: Take 1 tablet by mouth at bedtime as needed for iron-induced constipation    ? OXYGEN Inhale 2 L/min into the lungs. As needed and at night    ? pantoprazole (PROTONIX) 40 MG tablet Take 1 tablet (40 mg total) by mouth 2 (two) times daily. Take twice daily for 8 weeks and then change in dosing to once daily. 120 tablet 1  ? potassium chloride SA (KLOR-CON) 20 MEQ tablet Take 1 tablet (20 mEq total) by mouth once a week. (Patient taking differently: Take 20 mEq by mouth as directed. Takes when he take metolazone) 5 tablet 3  ? Riociguat (ADEMPAS) 2.5 MG TABS Take 2.5 mg by mouth 3 (three) times daily. 90 tablet 11  ? rOPINIRole (REQUIP) 2 MG tablet Take 1 tablet (2 mg total) by mouth 3 (three) times daily. 90 tablet 4  ? torsemide (DEMADEX) 100 MG tablet Take 1 tablet (100 mg total) by mouth 2 (two) times daily. 180 tablet 3  ? traMADol (ULTRAM) 50 MG tablet Take 50 mg by mouth every 6 (six) hours. As needed    ? traZODone  (DESYREL) 100 MG tablet Take 50 mg by mouth at bedtime.    ? UPTRAVI 1600 MCG TABS Take 1,600 mcg by mouth 2 (two) times daily.    ? UPTRAVI 200 MCG TABS Take 400 mcg by mouth 2 (two) times daily.    ? ?No

## 2021-04-29 NOTE — Patient Instructions (Signed)
Your Plan: ? ?Continue requip  ?If your symptoms worsen or you develop new symptoms please let us know.  ? ?Thank you for coming to see [place] at [facility]. I hope we have been able to provide you high quality care today. ? ?You may receive a patient satisfaction survey over the next few weeks. We would appreciate your feedback and comments so that we may continue to improve ourselves and the health of our patients. ? ?

## 2021-05-04 NOTE — Telephone Encounter (Signed)
Left message on machine to call back  ?

## 2021-05-04 NOTE — Telephone Encounter (Signed)
Inbound call from patient stating that he would like a call to discuss his medication. Please advise. ?

## 2021-05-05 NOTE — Telephone Encounter (Signed)
Left message on machine to call back  ?

## 2021-05-06 ENCOUNTER — Other Ambulatory Visit: Payer: Self-pay

## 2021-05-06 DIAGNOSIS — K2960 Other gastritis without bleeding: Secondary | ICD-10-CM

## 2021-05-06 MED ORDER — OMEPRAZOLE 20 MG PO CPDR: 20.0000 mg | DELAYED_RELEASE_CAPSULE | Freq: Two times a day (BID) | ORAL | 2 refills | Status: DC

## 2021-05-06 NOTE — Telephone Encounter (Signed)
Pt called and left a voice message on nursing triage VM indicating that neither medications are helping and is wanting to go back on Prilosec (states this was a historical med). Routing this message to Dr. Lorenso Courier to determine if she would like pt to schedule f/u appt to further discuss or if she would prefer to refill as requested. Will await her response. ?

## 2021-05-06 NOTE — Telephone Encounter (Signed)
Rx sent to pt preferred pharmacy per Dr. Libby Maw orders. ?

## 2021-05-11 ENCOUNTER — Ambulatory Visit (HOSPITAL_COMMUNITY)
Admission: RE | Admit: 2021-05-11 | Discharge: 2021-05-11 | Disposition: A | Discharge status: Home / Self Care | Payer: Medicare HMO | Source: Ambulatory Visit | Attending: Cardiology | Admitting: Cardiology

## 2021-05-11 ENCOUNTER — Ambulatory Visit (INDEPENDENT_AMBULATORY_CARE_PROVIDER_SITE_OTHER): Payer: Medicare HMO

## 2021-05-11 DIAGNOSIS — Z8673 Personal history of transient ischemic attack (TIA), and cerebral infarction without residual deficits: Secondary | ICD-10-CM

## 2021-05-11 DIAGNOSIS — I6521 Occlusion and stenosis of right carotid artery: Secondary | ICD-10-CM | POA: Insufficient documentation

## 2021-05-12 LAB — CUP PACEART REMOTE DEVICE CHECK
Date Time Interrogation Session: 20230411094708
Implantable Pulse Generator Implant Date: 20210325

## 2021-05-26 NOTE — Progress Notes (Signed)
Carelink Summary Report / Loop Recorder ? ?

## 2021-05-27 NOTE — Progress Notes (Addendum)
PCP: Roetta Sessions, NP ?Cardiology: Dr. Aundra Dubin ? ?88 y.o. with history of interstitial lung disease (?NSIP vs UIP) on home oxygen, pulmonary hypertension, diastolic CHF, and prior non-Hodgkins lymphoma (pelvic mass, s/p XRT) presents for followup of CHF and pulmonary hypertension.  Patient has history of reportedly mild ILD (NSIP vs UIP) followed by a pulmonologist at [facility] and pulmonary hypertension thought to be out of proportion lung disease followed by a physician at [facility].  Last CT chest was in 11/19, showing stable ILD (?UIP vs NSIP).  He had RHC in 2/20 with moderate PAH, PVR 5.1 WU.  He was tried on Adcirca but developed cognitive problems and worsening of peripheral edema so this was stopped.  He has been on home oxygen, uses at night and with exertion.  ?  ?He was admitted in 6/20 with worsening dyspnea and peripheral edema, weight up despite compliance with torsemide 40 mg bid at home.  He has CKD stage 3 and torsemide was decreased in the spring. He was diuresed aggressively with fall in weight.  Echo showed normal LV size and systolic function, RV reportedly normal.  6/20 RHC showed normal filling pressures with moderate pulmonary hypertension, PVR 5.35 WU.  ? ?After discharge from hospital, patient started on ambrisentan.  His weight steadily began to increase and he developed worsening lower extremity edema, we stopped ambrisentan.  ? ?Patient was admitted in 8/20 with AKI after taking metolazone for several days.  He was admitted again in 8/20 with acute on chronic diastolic CHF. He was diuresed.  Wandering atrial pacemaker noted.  ? ?He was admitted in 3/21 with suspected CVA, MRI head showed punctate occipital lobe infarct, thought to be due to small vessel disease. ILR was implanted.  Echo in 3/21 showed EF 60-65%, severe LVH, normal RV.  PYP scan in 3/21 was not suggestive of TTR amyloidosis.  Subsequently, ILR has shown paroxysmal atrial fibrillation.  ? ?He was admitted later in  3/21 with AKI, creatinine up to 5.  However, he corrected rapidly to baseline with IVF and creatinine was 1.02 when discharge.  ? ?Echo in 6/22 showed EF 65-70% with mild LVH, normal RV, IVC normal, PASP estimated 29 mmHg.  ? ?Patient was admitted in 9/22 with upper GI bleed from bleeding gastric polyps.  He had polypectomy and has been restarted on Eliquis.  ? ?He was seen in the office 10/23/20 and BMET showed creatinine up to 4.18.  He was also noted to be Fe deficient.  Torsemide, metolazone, and spironolactone were stopped. Follow up he felt better. Planned to resume torsemide in a couple days, continued to hold spiro and metolazone. Feraheme infusions arranged. ? ?He was seen in the ER with atrial fibrillation in 12/22.   ? ?Follow up 02/23/21, volume was mildly elevated and instructed to take an extra dose of metolazone that week.  ? ?He is here today for HF f/u. Recently gained about 10 lb, home weight up to 180 lb about 6 days ago. Took one dose of metolazone at that time and weight down to 174 lb at home today. Planning to take metolazone again this afternoon. Reports indiscretions with sodium intake but is working on this. Activity level limited by fatigue. Lives at an independent living facility and does get short of breath walking further distances. 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 Taking meds as prescribed. Denies any recent bleeding issues.  ? ?ECG (personally reviewed): SR with PACs, 75 bpm ? ?6 minute  walk (8/21): 122 m ?6 minute walk (11/21): 244 m ?6 minute walk (3/22): 122 m ? ?Labs (6/20): K 4.7, creatinine 1.46 ?Labs (7/20): K 3.1, creatinine 1.67 ?Labs (8/20): K 3.9, creatinine 1.8 ?Labs (9/20): K 4.9, creatinine 1.76, BNP 78 ?Labs (10/20): K 4.2, creatininine 1.5 ?Labs (11/20): BNP 36, K 4.3, creatinine 1.4 ?Labs (3/21): myeloma panel negative, urine immunofixation negative.  ?Labs (4/21): K 4.5, creatinine 1.02, hgb 11.4 ?Labs (6/21): K 5.1, creatinine 2.2, hgb  10.4 ?Labs (8/21): LDL 82 ?Labs (11/21): K 5.3, creatinine 2.11 ?Labs (3/22): K 3.9, creatinine 2.31 ?Labs (4/22): K 4.1, creatinine 2.15 ?Labs (9/22): hgb 8.6 => 9.1, creatinine 1.5 => 4.18, BUN 85, BNP 38.5 ?Labs (11/22): K 4.0, creatinine 1.7 ?Labs (12/22): BNP 140, K 3.7, creatinine 1.87, hgb 12.1 ?Labs (1/23): K 4.0, creatinine 1.44 ? ?PMH: ?1. Non-Hodgkin's lymphoma: Pelvic involvement.  S/p radiation.  Thought to be in remission (Dr. Benay Spice).  ?2. HTN ?3. PACs/PVCs ?4. GERD: Barrett's esophagus.  ?5. 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  ?- High resolution CT chest 11/19: ILD, ?UIP pattern.  ?6. Pulmonary hypertension: Thought to be out of proportion to underlying lung disease (ILD, ?NSIP vs UIP).   ?- RHC (2/20): mean RA 10, PASP 60 with mean 42, mean PCWP 11, CI 3, PVR 5.1 WU.  ?- Trial of Adcirca => failed due to ?memory worsening/cognitive affects and peripheral edema.  ?- Echo (6/20): EF 60-65%, mild LVH, RV reportedly normal, PASP 59 mmHg.  ?- RHC (6/20): mean RA 7, PA 50/24 mean 34, mean PCWP 12, CI 2.01, PAPI 3.7, PVR 5.35 WU ?- RF negative, ANA negative, anti-SCL70 negative, anti-centromere negative.  ?- V/Q scan (8/20): No evidence for chronic PE.  ?- Echo (3/21): EF 60-65%, severe LVH, normal RV.  ?- Echo (6/22): EF 65-70% with mild LVH, normal RV, IVC normal, PASP estimated 29 mmHg. ?7. CKD: Stage 3.   ?8. Wandering atrial pacemaker ?9. OSA ?10. PYP scan (3/21): H/CL ratio 1.0, grade 0.  ?11. CVA: MRI 3/21 with punctate occipital lobe infarct thought to be due to small vessel disease. ?- Carotid dopplers (3/21): 40-59% RICA stenosis.  ?- Carotid dopplers (4/22): 40-59% RICA stenosis.  ?12. Atrial fibrillation: Paroxysmal, noted on ILR.  ?13. GI bleeding 9/22 from gastric polyps.  ? ?Social History  ? ?Socioeconomic History  ? Marital status: Married  ?  Spouse name: Not on file  ? Number of children: 2  ? Years of education: Not on file  ? Highest education level: Not on file  ?Occupational History   ? Occupation: retired  ?  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 Smoking status: Former  ?  Packs/day: 1.00  ?  Years: 35.00  ?  Pack years: 35.00  ?  Types: Pipe, Cigarettes  ?  Quit date: 02/23/1992  ?  Years since quitting: 29.2  ? Smokeless tobacco: Never  ? Tobacco comments:  ?  quit 20 years ago  ?Vaping Use  ? Vaping Use: Never used  ?Substance and Sexual Activity  ? Alcohol use: Not Currently  ?  Comment: 2 drinks daily scotch  AND WINE WITH SUPPER  ? Drug use: No  ? Sexual activity: Not on file  ?Other Topics Concern  ? Not on file  ?Social History Narrative  ? Married - second marriage  ? He has two children  ? 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 Currently teaches pottery making, 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[place]  ? Former Smoker  quit 12 years ago- smoked for 35 years  ? Alcohol use- not currently  ? Right handed  ? ?Social Determinants of Health  ? ?Financial Resource Strain: Not on file  ?Food Insecurity: Not on file  ?Transportation Needs: Not on file  ?Physical Activity: Not on file  ?Stress: Not on file  ?Social Connections: Not on file  ?Intimate Partner Violence: Not on file  ? ?Family History  ?Problem Relation Age of Onset  ? Dementia Mother   ? Heart disease Father   ?     MI 47  ? Urticaria Father   ? Dementia Sister   ? Parkinson's disease Sister   ? ALS Sister   ? Prostate cancer Brother   ? Prostate cancer Paternal Uncle   ? Prostate cancer Paternal Uncle   ? Prostate cancer Paternal Uncle   ? Hyperlipidemia Other   ? Stroke Other   ? Hypertension Other   ? ADD / ADHD Other   ? Colon cancer Neg Hx   ? Allergic rhinitis Neg Hx   ? Asthma Neg Hx   ? Eczema Neg Hx   ? ?ROS: All systems reviewed and negative except as per HPI.  ? ?Current Outpatient Medications  ?Medication Sig Dispense Refill  ? acetaminophen (TYLENOL) 325 MG tablet Take 325 mg by mouth in the morning, at noon, and at bedtime.    ? apixaban (ELIQUIS) 2.5 MG TABS tablet Take 1 tablet (2.5 mg total) by mouth 2 (two)  times daily. 60 tablet 11  ? atorvastatin (LIPITOR) 40 MG tablet Take 1 tablet (40 mg total) by mouth daily at 6 PM. 30 tablet 1  ? cyanocobalamin (,VITAMIN B-12,) 1000 MCG/ML injection Inject 1,000 mcg into

## 2021-05-28 ENCOUNTER — Encounter (HOSPITAL_COMMUNITY): Payer: Self-pay

## 2021-05-28 ENCOUNTER — Ambulatory Visit (HOSPITAL_COMMUNITY)
Admission: RE | Admit: 2021-05-28 | Discharge: 2021-05-28 | Disposition: A | Discharge status: Home / Self Care | Payer: Medicare HMO | Source: Ambulatory Visit | Attending: Physician Assistant | Admitting: Physician Assistant

## 2021-05-28 VITALS — BP 100/62 | HR 80 | Wt 179.6 lb

## 2021-05-28 DIAGNOSIS — I2721 Secondary pulmonary arterial hypertension: Secondary | ICD-10-CM | POA: Insufficient documentation

## 2021-05-28 DIAGNOSIS — I130 Hypertensive heart and chronic kidney disease with heart failure and stage 1 through stage 4 chronic kidney disease, or unspecified chronic kidney disease: Secondary | ICD-10-CM | POA: Diagnosis not present

## 2021-05-28 DIAGNOSIS — I5032 Chronic diastolic (congestive) heart failure: Secondary | ICD-10-CM | POA: Diagnosis not present

## 2021-05-28 DIAGNOSIS — J8410 Pulmonary fibrosis, unspecified: Secondary | ICD-10-CM | POA: Insufficient documentation

## 2021-05-28 DIAGNOSIS — G4733 Obstructive sleep apnea (adult) (pediatric): Secondary | ICD-10-CM | POA: Insufficient documentation

## 2021-05-28 DIAGNOSIS — I272 Pulmonary hypertension, unspecified: Secondary | ICD-10-CM

## 2021-05-28 DIAGNOSIS — Z7901 Long term (current) use of anticoagulants: Secondary | ICD-10-CM | POA: Insufficient documentation

## 2021-05-28 DIAGNOSIS — Z79899 Other long term (current) drug therapy: Secondary | ICD-10-CM | POA: Insufficient documentation

## 2021-05-28 DIAGNOSIS — R5383 Other fatigue: Secondary | ICD-10-CM | POA: Diagnosis present

## 2021-05-28 DIAGNOSIS — I480 Paroxysmal atrial fibrillation: Secondary | ICD-10-CM | POA: Diagnosis not present

## 2021-05-28 DIAGNOSIS — Z8673 Personal history of transient ischemic attack (TIA), and cerebral infarction without residual deficits: Secondary | ICD-10-CM | POA: Insufficient documentation

## 2021-05-28 DIAGNOSIS — N1832 Chronic kidney disease, stage 3b: Secondary | ICD-10-CM | POA: Insufficient documentation

## 2021-05-28 DIAGNOSIS — R0602 Shortness of breath: Secondary | ICD-10-CM | POA: Insufficient documentation

## 2021-05-28 LAB — BASIC METABOLIC PANEL
Anion gap: 11 (ref 5–15)
BUN: 66 mg/dL — ABNORMAL HIGH (ref 8–23)
CO2: 28 mmol/L (ref 22–32)
Calcium: 7.2 mg/dL — ABNORMAL LOW (ref 8.9–10.3)
Chloride: 98 mmol/L (ref 98–111)
Creatinine, Ser: 2.05 mg/dL — ABNORMAL HIGH (ref 0.61–1.24)
GFR, Estimated: 31 mL/min — ABNORMAL LOW (ref >60)
Glucose, Bld: 93 mg/dL (ref 70–99)
Potassium: 3.3 mmol/L — ABNORMAL LOW (ref 3.5–5.1)
Sodium: 137 mmol/L (ref 135–145)

## 2021-05-28 LAB — CBC
HCT: 33.3 % — ABNORMAL LOW (ref 39.0–52.0)
Hemoglobin: 10.7 g/dL — ABNORMAL LOW (ref 13.0–17.0)
MCH: 29.2 pg (ref 26.0–34.0)
MCHC: 32.1 g/dL (ref 30.0–36.0)
MCV: 90.7 fL (ref 80.0–100.0)
Platelets: 267 10*3/uL (ref 150–400)
RBC: 3.67 MIL/uL — ABNORMAL LOW (ref 4.22–5.81)
RDW: 14.5 % (ref 11.5–15.5)
WBC: 6.9 10*3/uL (ref 4.0–10.5)
nRBC: 0 % (ref 0.0–0.2)

## 2021-05-28 LAB — BRAIN NATRIURETIC PEPTIDE: B Natriuretic Peptide: 72.2 pg/mL (ref 0.0–100.0)

## 2021-05-28 NOTE — Patient Instructions (Signed)
Thank you for coming in today ? ?Labs were done today, if any labs are abnormal the clinic will call you ?No news is good news  ? ?Your physician recommends that you schedule a follow-up appointment in:  ?8 weeks with Dr. Aundra Dubin ? ?At the [facility], you and your health needs are our priority. As part of our continuing mission to provide you with exceptional heart care, we have created designated Provider Care Teams. These Care Teams include your primary Cardiologist (physician) and Advanced Practice Providers (APPs- Physician Assistants and Nurse Practitioners) who all work together to provide you with the care you need, when you need it.  ? ?You may see any of the following providers on your designated Care Team at your next follow up: ?Dr Glori Bickers ?Dr Loralie Champagne ?Darrick Grinder, NP ?Lyda Jester, PA ?Jessica Milford,NP ?Marlyce Huge, PA ?Audry Riles, PharmD ? ? ?Please be sure to bring in all your medications bottles to every appointment.  ? ?If you have any questions or concerns before your next appointment please send [place] a message through [place] or call our office at [phone].   ? ?TO LEAVE A MESSAGE FOR THE NURSE SELECT OPTION 2, PLEASE LEAVE A MESSAGE INCLUDING: ?YOUR NAME ?DATE OF BIRTH ?CALL BACK NUMBER ?REASON FOR CALL**this is important as we prioritize the call backs ? ?YOU WILL RECEIVE A CALL BACK THE SAME DAY AS LONG AS YOU CALL BEFORE 4:00 PM ? ?

## 2021-06-02 ENCOUNTER — Other Ambulatory Visit (HOSPITAL_COMMUNITY): Payer: Medicare HMO

## 2021-06-02 ENCOUNTER — Telehealth (HOSPITAL_COMMUNITY): Payer: Self-pay

## 2021-06-02 DIAGNOSIS — I5032 Chronic diastolic (congestive) heart failure: Secondary | ICD-10-CM

## 2021-06-02 MED ORDER — POTASSIUM CHLORIDE CRYS ER 20 MEQ PO TBCR: 20.0000 meq | EXTENDED_RELEASE_TABLET | ORAL | 3 refills | Status: DC

## 2021-06-02 NOTE — Telephone Encounter (Signed)
Spoke with wife. Aware, agreeable, verbalizes understanding. Spouse states husband resides at senior living---will relay message as patient mailbox full. Med list updated. Repeat labs scheduled 5/17 at 2pm.  ?

## 2021-06-02 NOTE — Telephone Encounter (Signed)
-----  Message from Joette Catching, [place] sent at 05/28/2021  4:27 PM EDT ----- ?Scr up from baseline, now 2. K low at 3.3. Still had extra fluid on board. Continue current plan to take metolazone this afternoon. Suggest he start taking 20 mEQ potassium every other day instead of with metolazone only. Should get repeat BMET in 1 week. ? ?

## 2021-06-08 ENCOUNTER — Other Ambulatory Visit (HOSPITAL_COMMUNITY): Payer: Self-pay | Admitting: *Deleted

## 2021-06-08 MED ORDER — UPTRAVI 1000 MCG PO TABS: 2000.0000 ug | ORAL_TABLET | Freq: Two times a day (BID) | ORAL | 3 refills | Status: DC

## 2021-06-15 ENCOUNTER — Ambulatory Visit (INDEPENDENT_AMBULATORY_CARE_PROVIDER_SITE_OTHER): Payer: Medicare HMO

## 2021-06-15 DIAGNOSIS — I6310 Cerebral infarction due to embolism of unspecified precerebral artery: Secondary | ICD-10-CM

## 2021-06-15 LAB — CUP PACEART REMOTE DEVICE CHECK
Date Time Interrogation Session: 20230511142901
Implantable Pulse Generator Implant Date: 20210325

## 2021-06-17 ENCOUNTER — Other Ambulatory Visit (HOSPITAL_COMMUNITY): Payer: Medicare HMO

## 2021-06-17 ENCOUNTER — Ambulatory Visit (HOSPITAL_COMMUNITY)
Admission: RE | Admit: 2021-06-17 | Discharge: 2021-06-17 | Disposition: A | Discharge status: Home / Self Care | Payer: Medicare HMO | Source: Ambulatory Visit | Attending: Cardiology | Admitting: Cardiology

## 2021-06-17 DIAGNOSIS — I5032 Chronic diastolic (congestive) heart failure: Secondary | ICD-10-CM | POA: Diagnosis present

## 2021-06-17 LAB — BASIC METABOLIC PANEL
Anion gap: 8 (ref 5–15)
BUN: 66 mg/dL — ABNORMAL HIGH (ref 8–23)
CO2: 30 mmol/L (ref 22–32)
Calcium: 7.6 mg/dL — ABNORMAL LOW (ref 8.9–10.3)
Chloride: 100 mmol/L (ref 98–111)
Creatinine, Ser: 1.61 mg/dL — ABNORMAL HIGH (ref 0.61–1.24)
GFR, Estimated: 41 mL/min — ABNORMAL LOW (ref >60)
Glucose, Bld: 104 mg/dL — ABNORMAL HIGH (ref 70–99)
Potassium: 3.7 mmol/L (ref 3.5–5.1)
Sodium: 138 mmol/L (ref 135–145)

## 2021-06-19 ENCOUNTER — Telehealth (HOSPITAL_COMMUNITY): Payer: Self-pay | Admitting: *Deleted

## 2021-06-19 NOTE — Telephone Encounter (Signed)
Pt left vm asking if he should take continue taking potassium every other based off lab work. Called pt back no answer.

## 2021-06-22 ENCOUNTER — Other Ambulatory Visit (HOSPITAL_COMMUNITY): Payer: Self-pay | Admitting: Cardiology

## 2021-06-22 ENCOUNTER — Telehealth (HOSPITAL_COMMUNITY): Payer: Self-pay | Admitting: *Deleted

## 2021-06-22 DIAGNOSIS — I6521 Occlusion and stenosis of right carotid artery: Secondary | ICD-10-CM

## 2021-06-22 NOTE — Telephone Encounter (Signed)
Pt aware.

## 2021-06-22 NOTE — Telephone Encounter (Signed)
Continue potassium every other day. His potassium is better now, but this is likely because he has been taking the supplement.

## 2021-06-22 NOTE — Telephone Encounter (Signed)
Pt called asking if he was to continue taking potassium every other day. Pt said he had labs last week to recheck potassium. Lab note says "labs stable no changes" but pt thought he was only supposed to take potassium for a short time.  Routed to [employer]

## 2021-07-03 NOTE — Progress Notes (Signed)
Carelink Summary Report / Loop Recorder

## 2021-07-08 ENCOUNTER — Other Ambulatory Visit (HOSPITAL_COMMUNITY): Payer: Self-pay | Admitting: Cardiology

## 2021-07-20 ENCOUNTER — Ambulatory Visit (INDEPENDENT_AMBULATORY_CARE_PROVIDER_SITE_OTHER): Payer: Medicare HMO

## 2021-07-20 DIAGNOSIS — I6310 Cerebral infarction due to embolism of unspecified precerebral artery: Secondary | ICD-10-CM

## 2021-07-21 ENCOUNTER — Telehealth: Payer: Self-pay | Admitting: Adult Health

## 2021-07-21 DIAGNOSIS — R590 Localized enlarged lymph nodes: Secondary | ICD-10-CM

## 2021-07-21 DIAGNOSIS — D649 Anemia, unspecified: Secondary | ICD-10-CM

## 2021-07-21 DIAGNOSIS — C8580 Other specified types of non-Hodgkin lymphoma, unspecified site: Secondary | ICD-10-CM

## 2021-07-21 DIAGNOSIS — C8599 Non-Hodgkin lymphoma, unspecified, extranodal and solid organ sites: Secondary | ICD-10-CM

## 2021-07-21 LAB — CUP PACEART REMOTE DEVICE CHECK
Date Time Interrogation Session: 20230620131654
Implantable Pulse Generator Implant Date: 20210325

## 2021-07-21 NOTE — Telephone Encounter (Signed)
At 11:55 pt left a vm stating he has had RLS nonstop for a week, he has spent about 6-8 hours at a time standing in his kitchen which has caused him extreme exhaustion.  Pt stated he is seeking relief and would love a call from [place], NP to discuss options.

## 2021-07-21 NOTE — Telephone Encounter (Signed)
Spoke with patient  Pt states not during well with  RLS . Pt states he is taking his requip 3x daily . Pt states not able to sleep at night because his legs keep moving . Pt states tries ti sleep in chair still same results . Will forward to Megan,NP to make her aware

## 2021-07-21 NOTE — Telephone Encounter (Signed)
Dr. Brett Fairy any recommendations? He has tried multiple meds in the past (can look at my last note).   POD 4: can you let him know I am discussing with Dr. Brett Fairy

## 2021-07-22 NOTE — Telephone Encounter (Signed)
This gentleman is already taking 6 mg requip per day,a high dose.  Has been seen by Dr Jaynee Eagles for a CVA in 2021 and was seen by Dr Jannifer Franklin for sciatica, NP and lumbar stenosis? If his RLS is related to a Neuropathy, he may benefit from neurontin 100 mg at night, titrating up to 300 mg at night.  If he is having radiculopathic or lumbar stenosis related pain and RLS, consider baclofen.  Also, RLS is strongly correlated to iron metabolism, a ferritin of 50 is a treatment goal ( have to consider, cardiac and renal and liver function, of course)  his Iron levels and ferritin are darn low-  WHY? Does he lose blood or is it a malignancy related underproduction?    Ref Range & Units 6 mo ago (12/31/20) 9 mo ago (10/23/20) 1 yr ago (06/25/20) 1 yr ago (05/06/20) 2 yr ago (05/03/19) 10 yr ago (10/26/10) 11 yr ago (05/28/10)  Iron 45 - 182 ug/dL 26 Low   41 Low   76 R  23 Low   72  63 R  53 R   TIBC 250 - 450 ug/dL 489 High   484 High   436  469 High   344     Saturation Ratios 17.9 - 39.5 % 5 Low   8 Low    5 Low        Ref Range & Units 6 mo ago (12/31/20) 9 mo ago (10/23/20) 1 yr ago (06/25/20) 1 yr ago (05/06/20) 2 yr ago (05/03/19) 7 yr ago (12/25/13) 7 yr ago (09/03/13)  Ferritin 24 - 336 ng/mL 12 Low   20 Low  CM  20 Low  R  7 Low        RBC 4.22 - 5.81 MIL/uL 3.67 Low   4.33  3.90 Low   3.82 Low   3.17 Low   3.76 Low   3.60 Low    Hemoglobin 13.0 - 17.0 g/dL 10.7 Low   12.1 Low   10.5 Low   9.7 Low   7.4 Low   9.1 Low   8.6 Low    HCT 39.0 - 52.0 % 33.3 Low   39.0  34.6 Low   33.0 Low

## 2021-07-22 NOTE — Telephone Encounter (Signed)
Spoke to Patient informed patient Ryan Blossom, NP and Dr Brett Fairy is discussing options for  his RLS  and we will call him back as soon has we get Dr Dohmeier suggestions . Pt thanked me for calling him  back

## 2021-07-23 MED ORDER — GABAPENTIN 100 MG PO CAPS: 100.0000 mg | ORAL_CAPSULE | Freq: Every day | ORAL | 0 refills | Status: DC

## 2021-07-23 NOTE — Addendum Note (Signed)
Addended by: Brandon Melnick on: 07/23/2021 12:24 PM   Modules accepted: Orders

## 2021-07-23 NOTE — Telephone Encounter (Signed)
We can always make referral to psych for eval if he wants it?

## 2021-07-23 NOTE — Addendum Note (Signed)
Addended by: Trudie Buckler on: 07/23/2021 12:55 PM   Modules accepted: Orders

## 2021-07-23 NOTE — Telephone Encounter (Signed)
LMVM for pt that did send in rx to [facility] for gabapentin 142m po qhs.  May do psych eval if he wants, he may call on his own with no referral needed as well.  He to let [place] if so.

## 2021-07-23 NOTE — Telephone Encounter (Signed)
Pt called wanting to know the update on this situation. Pt states he has not had much sleep in the last week. Please advise.

## 2021-07-23 NOTE — Telephone Encounter (Signed)
I called pt back.  He has seen a MD tht makes house calls.  They will be doing lab work.  He states that as far as he knows his HGB has been ok. (He does not have a bleed).  I relayed that he shows chronic low iron from the last year questioning if absorbing oral Fe.  Since 9pm last night to right now he has not slept for 15 hours, standing but will sit for 15-20 sec then has to get up.  So uncomfortable.  He did ok  sitting while House Call MD was there. He questions  if psych component.  (Being distracted makes it go away).  He is ok to try gabapentin 142m poqhs.  Relayed SE dizziness/drowsiness.  He did make mention that this increase or RLS correlated with viruses that he has right now (UR and GI).

## 2021-07-28 ENCOUNTER — Ambulatory Visit (HOSPITAL_COMMUNITY)
Admission: RE | Admit: 2021-07-28 | Discharge: 2021-07-28 | Disposition: A | Discharge status: Home / Self Care | Payer: Medicare HMO | Source: Ambulatory Visit | Attending: Cardiology | Admitting: Cardiology

## 2021-07-28 VITALS — BP 80/50 | HR 84 | Wt 174.6 lb

## 2021-07-28 DIAGNOSIS — I2721 Secondary pulmonary arterial hypertension: Secondary | ICD-10-CM | POA: Diagnosis not present

## 2021-07-28 DIAGNOSIS — N1832 Chronic kidney disease, stage 3b: Secondary | ICD-10-CM | POA: Diagnosis not present

## 2021-07-28 DIAGNOSIS — J8410 Pulmonary fibrosis, unspecified: Secondary | ICD-10-CM | POA: Insufficient documentation

## 2021-07-28 DIAGNOSIS — Z7901 Long term (current) use of anticoagulants: Secondary | ICD-10-CM | POA: Diagnosis not present

## 2021-07-28 DIAGNOSIS — I480 Paroxysmal atrial fibrillation: Secondary | ICD-10-CM | POA: Insufficient documentation

## 2021-07-28 DIAGNOSIS — I5032 Chronic diastolic (congestive) heart failure: Secondary | ICD-10-CM | POA: Insufficient documentation

## 2021-07-28 DIAGNOSIS — G2581 Restless legs syndrome: Secondary | ICD-10-CM | POA: Diagnosis not present

## 2021-07-28 DIAGNOSIS — Z8673 Personal history of transient ischemic attack (TIA), and cerebral infarction without residual deficits: Secondary | ICD-10-CM | POA: Insufficient documentation

## 2021-07-28 DIAGNOSIS — G4733 Obstructive sleep apnea (adult) (pediatric): Secondary | ICD-10-CM | POA: Diagnosis not present

## 2021-07-28 DIAGNOSIS — I130 Hypertensive heart and chronic kidney disease with heart failure and stage 1 through stage 4 chronic kidney disease, or unspecified chronic kidney disease: Secondary | ICD-10-CM | POA: Insufficient documentation

## 2021-07-28 LAB — BASIC METABOLIC PANEL
Anion gap: 13 (ref 5–15)
BUN: 98 mg/dL — ABNORMAL HIGH (ref 8–23)
CO2: 26 mmol/L (ref 22–32)
Calcium: 7.7 mg/dL — ABNORMAL LOW (ref 8.9–10.3)
Chloride: 98 mmol/L (ref 98–111)
Creatinine, Ser: 1.88 mg/dL — ABNORMAL HIGH (ref 0.61–1.24)
GFR, Estimated: 34 mL/min — ABNORMAL LOW (ref >60)
Glucose, Bld: 111 mg/dL — ABNORMAL HIGH (ref 70–99)
Potassium: 3.1 mmol/L — ABNORMAL LOW (ref 3.5–5.1)
Sodium: 137 mmol/L (ref 135–145)

## 2021-07-28 LAB — BRAIN NATRIURETIC PEPTIDE: B Natriuretic Peptide: 40.7 pg/mL (ref 0.0–100.0)

## 2021-07-28 MED ORDER — METOLAZONE 2.5 MG PO TABS: 2.5000 mg | ORAL_TABLET | ORAL | 4 refills | Status: DC

## 2021-07-28 NOTE — Telephone Encounter (Signed)
FYI: Pt said, took 2 tablets of Ropinrole and not having any events that keep me from sleeping.

## 2021-07-29 NOTE — Telephone Encounter (Signed)
I called pt.  He stated that he is taking ropinirole 45m tablet every 6.5 hours (alarm set) if has an episode will take another tablet 234m) with hot shower .   He see's his pcp tomorrow for lab results.  Saw cardiology and they took labs but not CBC.  I told him to have pcp copy labs results.  Did relay that RLS can be caused by low iron.  His is taking the gabapentin 10060mo qhs.

## 2021-07-29 NOTE — Telephone Encounter (Signed)
So how much total of requip is he taking. Prescription is for total of 6 mg

## 2021-07-29 NOTE — Telephone Encounter (Signed)
Yes, he is taking 6 mg daily as scheduled.  I asked him how much additional he would take on daily basis and he said another 2 mg (one tablet).  He is aware that is not ordered.

## 2021-07-31 ENCOUNTER — Telehealth (HOSPITAL_COMMUNITY): Payer: Self-pay | Admitting: *Deleted

## 2021-07-31 DIAGNOSIS — I5032 Chronic diastolic (congestive) heart failure: Secondary | ICD-10-CM

## 2021-07-31 MED ORDER — POTASSIUM CHLORIDE CRYS ER 20 MEQ PO TBCR: 20.0000 meq | EXTENDED_RELEASE_TABLET | Freq: Every day | ORAL | 3 refills | Status: DC

## 2021-07-31 NOTE — Telephone Encounter (Signed)
-----  Message from Larey Dresser, MD sent at 07/28/2021  4:18 PM EDT ----- Increase KCl to 40 mEq daily. BMET next week.

## 2021-07-31 NOTE — Telephone Encounter (Signed)
Marsh Dolly Daykin, RN  07/31/2021  4:45 PM EDT Back to Top    Spoke w/pt, he is aware, he states he just increased from 20 meq QOD to 20 meq QD right before labs were drawn so not sure he wants to increase further at this time. Advised that's reasonable, will recheck labs as scheduled 7/10   Kerry Dory, [facility]  07/29/2021  2:10 PM EDT     Patient called.  Unable to reach patient. Mailbox full Phone:  [phone] (M)

## 2021-08-06 ENCOUNTER — Other Ambulatory Visit: Payer: Self-pay | Admitting: Adult Health

## 2021-08-06 NOTE — Telephone Encounter (Signed)
Pt asked it be noted that he has RLS about every 5 hours on average.  Phone rep confirmed for pt the request for a refill on his rOPINIRole (REQUIP) 2 MG tablet, is already in the system.

## 2021-08-08 ENCOUNTER — Other Ambulatory Visit: Payer: Self-pay | Admitting: Adult Health

## 2021-08-10 ENCOUNTER — Ambulatory Visit (HOSPITAL_COMMUNITY)
Admission: RE | Admit: 2021-08-10 | Discharge: 2021-08-10 | Disposition: A | Discharge status: Home / Self Care | Payer: Medicare HMO | Source: Ambulatory Visit | Attending: Internal Medicine | Admitting: Internal Medicine

## 2021-08-10 DIAGNOSIS — I5032 Chronic diastolic (congestive) heart failure: Secondary | ICD-10-CM | POA: Insufficient documentation

## 2021-08-10 LAB — BASIC METABOLIC PANEL
Anion gap: 13 (ref 5–15)
BUN: 95 mg/dL — ABNORMAL HIGH (ref 8–23)
CO2: 31 mmol/L (ref 22–32)
Calcium: 7.2 mg/dL — ABNORMAL LOW (ref 8.9–10.3)
Chloride: 91 mmol/L — ABNORMAL LOW (ref 98–111)
Creatinine, Ser: 2.05 mg/dL — ABNORMAL HIGH (ref 0.61–1.24)
GFR, Estimated: 31 mL/min — ABNORMAL LOW (ref >60)
Glucose, Bld: 125 mg/dL — ABNORMAL HIGH (ref 70–99)
Potassium: 3.4 mmol/L — ABNORMAL LOW (ref 3.5–5.1)
Sodium: 135 mmol/L (ref 135–145)

## 2021-08-10 NOTE — Progress Notes (Signed)
Carelink Summary Report / Loop Recorder

## 2021-08-17 ENCOUNTER — Ambulatory Visit (HOSPITAL_COMMUNITY)
Admission: RE | Admit: 2021-08-17 | Discharge: 2021-08-17 | Disposition: A | Discharge status: Home / Self Care | Payer: Medicare HMO | Source: Ambulatory Visit | Attending: Cardiology | Admitting: Cardiology

## 2021-08-17 DIAGNOSIS — I5032 Chronic diastolic (congestive) heart failure: Secondary | ICD-10-CM

## 2021-08-17 DIAGNOSIS — E119 Type 2 diabetes mellitus without complications: Secondary | ICD-10-CM | POA: Diagnosis not present

## 2021-08-17 DIAGNOSIS — I110 Hypertensive heart disease with heart failure: Secondary | ICD-10-CM | POA: Diagnosis not present

## 2021-08-17 DIAGNOSIS — J449 Chronic obstructive pulmonary disease, unspecified: Secondary | ICD-10-CM | POA: Insufficient documentation

## 2021-08-17 DIAGNOSIS — E785 Hyperlipidemia, unspecified: Secondary | ICD-10-CM | POA: Insufficient documentation

## 2021-08-17 LAB — ECHOCARDIOGRAM COMPLETE
Area-P 1/2: 4.6 cm2
Calc EF: 74.8 %
S' Lateral: 2.75 cm
Single Plane A2C EF: 74.5 %
Single Plane A4C EF: 74.9 %

## 2021-08-19 ENCOUNTER — Telehealth (HOSPITAL_COMMUNITY): Payer: Self-pay

## 2021-08-19 NOTE — Telephone Encounter (Addendum)
Pt aware, agreeable, and verbalized understanding   ----- Message from Larey Dresser, MD sent at 08/17/2021  9:14 PM EDT ----- Good news, the RV looks normal and estimated PA pressure is not elevated.

## 2021-08-20 ENCOUNTER — Other Ambulatory Visit (HOSPITAL_COMMUNITY): Payer: Self-pay

## 2021-08-20 MED ORDER — ADEMPAS 2.5 MG PO TABS: 2.5000 mg | ORAL_TABLET | Freq: Three times a day (TID) | ORAL | 11 refills | Status: DC

## 2021-08-24 ENCOUNTER — Ambulatory Visit (INDEPENDENT_AMBULATORY_CARE_PROVIDER_SITE_OTHER): Payer: Medicare HMO

## 2021-08-24 DIAGNOSIS — I639 Cerebral infarction, unspecified: Secondary | ICD-10-CM | POA: Diagnosis not present

## 2021-08-24 LAB — CUP PACEART REMOTE DEVICE CHECK
Date Time Interrogation Session: 20230724125001
Implantable Pulse Generator Implant Date: 20210325

## 2021-08-26 NOTE — Progress Notes (Signed)
PCP: Roetta Sessions, NP Cardiology: Dr. Aundra Dubin  88 y.o. with history of interstitial lung disease (?NSIP vs UIP) on home oxygen, pulmonary hypertension, diastolic CHF, and prior non-Hodgkins lymphoma (pelvic mass, s/p XRT) presents for followup of CHF and pulmonary hypertension.  Patient has history of reportedly mild ILD (NSIP vs UIP) followed by a pulmonologist at [facility] and pulmonary hypertension thought to be out of proportion lung disease followed by a physician at [facility].  Last CT chest was in 11/19, showing stable ILD (?UIP vs NSIP).  He had RHC in 2/20 with moderate PAH, PVR 5.1 WU.  He was tried on Adcirca but developed cognitive problems and worsening of peripheral edema so this was stopped.  He has been on home oxygen, uses at night and with exertion.    He was admitted in 6/20 with worsening dyspnea and peripheral edema, weight up despite compliance with torsemide 40 mg bid at home.  He has CKD stage 3 and torsemide was decreased in the spring. He was diuresed aggressively with fall in weight.  Echo showed normal LV size and systolic function, RV reportedly normal.  6/20 RHC showed normal filling pressures with moderate pulmonary hypertension, PVR 5.35 WU.   After discharge from hospital, patient started on ambrisentan.  His weight steadily began to increase and he developed worsening lower extremity edema, we stopped ambrisentan.   Patient was admitted in 8/20 with AKI after taking metolazone for several days.  He was admitted again in 8/20 with acute on chronic diastolic CHF. He was diuresed.  Wandering atrial pacemaker noted.   He was admitted in 3/21 with suspected CVA, MRI head showed punctate occipital lobe infarct, thought to be due to small vessel disease. ILR was implanted.  Echo in 3/21 showed EF 60-65%, severe LVH, normal RV.  PYP scan in 3/21 was not suggestive of TTR amyloidosis.  Subsequently, ILR has shown paroxysmal atrial fibrillation.   He was admitted later  in 3/21 with AKI, creatinine up to 5.  However, he corrected rapidly to baseline with IVF and creatinine was 1.02 when discharge.   Echo in 6/22 showed EF 65-70% with mild LVH, normal RV, IVC normal, PASP estimated 29 mmHg.   Patient was admitted in 9/22 with upper GI bleed from bleeding gastric polyps.  He had polypectomy and has been restarted on Eliquis.   He was seen in the office 10/23/20 and BMET showed creatinine up to 4.18.  He was also noted to be Fe deficient.  Torsemide, metolazone, and spironolactone were stopped. Follow up he felt better. Planned to resume torsemide in a couple days, continued to hold spiro and metolazone. Feraheme infusions arranged.  He was seen in the ER with atrial fibrillation in 12/22.    Today he returns for HF follow up with his ex-wife. Having progressive vision loss due to macular degeneration. Overall feeling ok. He does complain of fatigue. Every now and then short of breath. Denies PND/Orthopnea. Only using oxygen as needed. Appetite ok. No fever or chills.  Taking all medications.   6 minute walk (8/21): 122 m 6 minute walk (11/21): 244 m 6 minute walk (3/22): 122 m  Labs (6/20): K 4.7, creatinine 1.46 Labs (7/20): K 3.1, creatinine 1.67 Labs (8/20): K 3.9, creatinine 1.8 Labs (9/20): K 4.9, creatinine 1.76, BNP 78 Labs (10/20): K 4.2, creatininine 1.5 Labs (11/20): BNP 36, K 4.3, creatinine 1.4 Labs (3/21): myeloma panel negative, urine immunofixation negative.  Labs (4/21): K 4.5, creatinine 1.02, hgb 11.4 Labs (  6/21): K 5.1, creatinine 2.2, hgb 10.4 Labs (8/21): LDL 82 Labs (11/21): K 5.3, creatinine 2.11 Labs (3/22): K 3.9, creatinine 2.31 Labs (4/22): K 4.1, creatinine 2.15 Labs (9/22): hgb 8.6 => 9.1, creatinine 1.5 => 4.18, BUN 85, BNP 38.5 Labs (11/22): K 4.0, creatinine 1.7 Labs (12/22): BNP 140, K 3.7, creatinine 1.87, hgb 12.1 Labs (1/23): K 4.0, creatinine 1.44 Labs (5/23): K 3.7, creatinine 1.6  PMH: 1. Non-Hodgkin's  lymphoma: Pelvic involvement.  S/p radiation.  Thought to be in remission (Dr. Benay Spice).  2. HTN 3. PACs/PVCs 4. GERD: Barrett's esophagus.  5. 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  - High resolution CT chest 11/19: ILD, ?UIP pattern.  6. Pulmonary hypertension: Thought to be out of proportion to underlying lung disease (ILD, ?NSIP vs UIP).   - RHC (2/20): mean RA 10, PASP 60 with mean 42, mean PCWP 11, CI 3, PVR 5.1 WU.  - Trial of Adcirca => failed due to ?memory worsening/cognitive affects and peripheral edema.  - Echo (6/20): EF 60-65%, mild LVH, RV reportedly normal, PASP 59 mmHg.  - RHC (6/20): mean RA 7, PA 50/24 mean 34, mean PCWP 12, CI 2.01, PAPI 3.7, PVR 5.35 WU - RF negative, ANA negative, anti-SCL70 negative, anti-centromere negative.  - V/Q scan (8/20): No evidence for chronic PE.  - Echo (3/21): EF 60-65%, severe LVH, normal RV.  - Echo (6/22): EF 65-70% with mild LVH, normal RV, IVC normal, PASP estimated 29 mmHg. 7. CKD: Stage 3.   8. Wandering atrial pacemaker 9. OSA 10. PYP scan (3/21): H/CL ratio 1.0, grade 0.  11. CVA: MRI 3/21 with punctate occipital lobe infarct thought to be due to small vessel disease. - Carotid dopplers (3/21): 40-59% RICA stenosis.  - Carotid dopplers (4/22): 40-59% RICA stenosis.  - Carotid dopplers (4/23): 40-59% RICA stenosis.  12. Atrial fibrillation: Paroxysmal, noted on ILR.  13. GI bleeding 9/22 from gastric polyps.  14. 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[employer], [employer]  Tobacco Use   Smoking status: Former    Packs/day: 1.00    Years: 35.00    Total pack years: 35.00    Types: Pipe, Cigarettes    Quit date: 02/23/1992    Years since quitting: 29.5   Smokeless tobacco: Never    Tobacco comments:    quit 20 years ago  Vaping Use   Vaping Use: Never used  Substance and Sexual Activity   Alcohol use: Not Currently    Comment: 2 drinks daily scotch  AND WINE WITH SUPPER   Drug use: No   Sexual activity: Not on file  Other Topics Concern   Not on file  Social History Narrative   Married - second marriage   He has two children   Retired Horticulturist, commercial Professor   Currently teaches pottery making, 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[place]   Former Smoker quit 12 years ago- smoked for 35 years   Alcohol use- not currently   Right handed   Social Determinants of Radio broadcast assistant Strain: Not on file  Food Insecurity: Not on file  Transportation Needs: Not on file  Physical Activity: Not on file  Stress: Not on file  Social Connections: Not on file  Intimate Partner Violence: Not on file   Family History  Problem Relation Age of Onset   Dementia Mother    Heart disease Father        MI 38   Urticaria Father    Dementia Sister    Parkinson's disease Sister    ALS Sister    Prostate cancer Brother    Prostate cancer Paternal Uncle    Prostate cancer Paternal Uncle    Prostate cancer Paternal Uncle    Hyperlipidemia Other    Stroke Other    Hypertension Other    ADD / ADHD Other    Colon cancer Neg Hx    Allergic rhinitis Neg Hx    Asthma Neg Hx    Eczema Neg Hx    ROS: All systems reviewed and negative except as per HPI.   Current Outpatient Medications  Medication Sig Dispense Refill   acetaminophen (TYLENOL) 325 MG tablet Take 325 mg by mouth in the morning, at noon, and at bedtime.     atorvastatin (LIPITOR) 40 MG tablet Take 1 tablet (40 mg total) by mouth daily at 6 PM. 30 tablet 1   cyanocobalamin (,VITAMIN B-12,) 1000 MCG/ML injection Inject 1,000 mcg into the muscle every 30 (thirty) days.     ELIQUIS 2.5 MG TABS tablet TAKE ONE TABLET TWICE DAILY 60 tablet 11   escitalopram (LEXAPRO) 10 MG tablet Take 10 mg by mouth daily.      finasteride (PROSCAR) 5 MG tablet Take 5 mg by mouth daily.     FLONASE SENSIMIST 27.5 MCG/SPRAY nasal spray Place 1 spray into the nose daily as needed for rhinitis or allergies.      gabapentin (NEURONTIN) 100 MG capsule TAKE ONE CAPSULE AT BEDTIME 30 capsule 0   Iron, Ferrous Sulfate, 325 (65 Fe) MG TABS Take 325 mg by mouth daily. 30 tablet 4   metolazone (ZAROXOLYN) 2.5 MG tablet Take 1 tablet (2.5 mg total) by mouth once a week. Every Sunday morning, 15 tablet 4   metoprolol succinate (TOPROL-XL) 25 MG 24 hr tablet TAKE ONE-HALF TABLET AT BEDTIME 15 tablet 3   NON FORMULARY Take 1 tablet by mouth See admin instructions. Unnamed laxative: Take 1 tablet by mouth at bedtime as needed for iron-induced constipation     omeprazole (PRILOSEC) 20 MG capsule Take 1 capsule (20 mg total) by mouth 2 (two) times daily before a meal. 60 capsule 2   OXYGEN Inhale 2 L/min into the lungs. As needed and at night     potassium chloride SA (KLOR-CON M) 20 MEQ tablet Take 1 tablet (20 mEq total) by mouth daily. 5 tablet 3   psyllium (METAMUCIL) 58.6 % packet Take 1 packet by mouth daily as needed.     Riociguat (ADEMPAS) 2.5 MG TABS Take 2.5 mg by mouth 3 (three) times daily. 90 tablet 11   rOPINIRole (REQUIP) 2 MG tablet TAKE ONE TABLET THREE TIMES DAILY 90 tablet 4   Selexipag (UPTRAVI) 1000 MCG TABS Take 2,000 mcg by mouth 2 (two) times daily. 360 tablet 3   torsemide (DEMADEX) 100 MG tablet Take 1 tablet (100 mg total) by mouth 2 (two) times daily. 180 tablet 3   traMADol (ULTRAM) 50 MG tablet Take 50 mg by mouth every 6 (six) hours. As needed     No current facility-administered medications for this encounter.   BP (!) 100/56   Pulse 77   Wt 79.9 kg (176 lb 3.2 oz)  SpO2 91%   BMI 27.60 kg/m   Wt Readings from Last 3 Encounters:  08/27/21 79.9 kg (176 lb 3.2 oz)  07/28/21 79.2 kg (174 lb 9.6 oz)  05/28/21 81.5 kg (179 lb 9.6 oz)  General:  Walked in the clinic on room air. No resp  difficulty HEENT: normal Neck: supple. no JVD. Carotids 2+ bilat; no bruits. No lymphadenopathy or thryomegaly appreciated. Cor: PMI nondisplaced. Regular rate & rhythm. No rubs, gallops or murmurs. Lungs: clear Abdomen: soft, nontender, nondistended. No hepatosplenomegaly. No bruits or masses. Good bowel sounds. Extremities: no cyanosis, clubbing, rash, R and LLE trace lower extremity edema Neuro: alert & orientedx3, cranial nerves grossly intact. moves all 4 extremities w/o difficulty. Affect pleasant   Assessment/Plan: 1.Chronic diastolic CHF: Suspect with significant component of RV failure from pulmonary hypertension.  Echo in 6/20 with EF 60-65%, RV not well-visualized but PASP 59 mmHg.  Echo (3/21) with EF 60-65%, severe LVH, normal RV, PASP 28 mmHg.  PYP scan was not suggestive of TTR cardiac amyloidosis. Echo in 6/22 showed EF 65-70% with mild LVH, normal RV, IVC normal, PASP estimated 29 mmHg.   Echo in 08/2021 EF 65-70%, normal RV and estimated PASP 33 mmHg.  NYHA II. Volume status stable. Continue torsemide 100 mg twice a day.  - Continue metolazone weekly.  - Continue Toprol XL 12.5 mg nightly. - Check BMET  2. CKD Stage 3b: BMET today. 3. Pulmonary hypertension: Patient thought to have [place] out of proportion to his lung disease (ILD, NSIP versus UIP). RHC in 2/20 showed mean PA pressure 41, PVR 5.1 WU, CI 3. Echo in 6/20 with RV reportedly ok => I reviewed and do not think that RV was well-visualized.  Patient did not tolerate Adcirca due to edema/?cognitive effects.  RHC was done again in 6/20, showed moderate pulmonary arterial hypertension with PVR 5.35 (comparable to prior).  Serological workup for PH was negative. Patient started ambrisentan 5 mg and developed significant edema/volume overload despite a high dose of torsemide. He is now on Uptravi + riociguat.  Most recent echo in 6/22 showed normal RV size/systolic function and PA systolic pressure estimate was normal.   - Echo  08/2021 remained stable.  - He will stay off ambrisentan (? trigger for volume overload).  - As above, he did not tolerate tadalafil.  - He is at 2000 mcg bid Uptravi.   - Continue riociguat at goal dose 2.5 mg tid.  4. Atrial fibrillation: Paroxysmal, noted on ILR. NSR today - Continue apixaban 2.5 mg bid (dosed for age, renal dysfunction).  5. OSA: Intolerant CPAP 6. CVA: In 3/21, may have been atrial fibrillation-related.  On apixaban now.  7. Carotid stenosis: Carotid duplex 04/23: 40-59% right ICA stenosis, no significant stenosis on the left - Continue statin. - Repeat study in 1 year.  8. Interstitial lung disease:  Has seen pulmonary, ILD seems mild and stable.  9. Fatigue  Check CBC, TSH.    Follow up with Dr Aundra Dubin in 3 months.   Jamyiah Labella NP-C  08/27/2021

## 2021-08-27 ENCOUNTER — Encounter (HOSPITAL_COMMUNITY): Payer: Self-pay

## 2021-08-27 ENCOUNTER — Ambulatory Visit (HOSPITAL_COMMUNITY)
Admission: RE | Admit: 2021-08-27 | Discharge: 2021-08-27 | Disposition: A | Discharge status: Home / Self Care | Payer: Medicare HMO | Source: Ambulatory Visit | Attending: Adult Health | Admitting: Adult Health

## 2021-08-27 VITALS — BP 100/56 | HR 77 | Wt 176.2 lb

## 2021-08-27 DIAGNOSIS — I6522 Occlusion and stenosis of left carotid artery: Secondary | ICD-10-CM | POA: Diagnosis not present

## 2021-08-27 DIAGNOSIS — Z9981 Dependence on supplemental oxygen: Secondary | ICD-10-CM | POA: Diagnosis not present

## 2021-08-27 DIAGNOSIS — Z87898 Personal history of other specified conditions: Secondary | ICD-10-CM | POA: Insufficient documentation

## 2021-08-27 DIAGNOSIS — I480 Paroxysmal atrial fibrillation: Secondary | ICD-10-CM | POA: Diagnosis not present

## 2021-08-27 DIAGNOSIS — I2721 Secondary pulmonary arterial hypertension: Secondary | ICD-10-CM | POA: Diagnosis not present

## 2021-08-27 DIAGNOSIS — I5032 Chronic diastolic (congestive) heart failure: Secondary | ICD-10-CM

## 2021-08-27 DIAGNOSIS — R5383 Other fatigue: Secondary | ICD-10-CM | POA: Diagnosis not present

## 2021-08-27 DIAGNOSIS — Z7901 Long term (current) use of anticoagulants: Secondary | ICD-10-CM | POA: Diagnosis not present

## 2021-08-27 DIAGNOSIS — J8410 Pulmonary fibrosis, unspecified: Secondary | ICD-10-CM | POA: Insufficient documentation

## 2021-08-27 DIAGNOSIS — I130 Hypertensive heart and chronic kidney disease with heart failure and stage 1 through stage 4 chronic kidney disease, or unspecified chronic kidney disease: Secondary | ICD-10-CM | POA: Insufficient documentation

## 2021-08-27 DIAGNOSIS — N1832 Chronic kidney disease, stage 3b: Secondary | ICD-10-CM | POA: Insufficient documentation

## 2021-08-27 DIAGNOSIS — Z8673 Personal history of transient ischemic attack (TIA), and cerebral infarction without residual deficits: Secondary | ICD-10-CM | POA: Diagnosis not present

## 2021-08-27 DIAGNOSIS — G4733 Obstructive sleep apnea (adult) (pediatric): Secondary | ICD-10-CM | POA: Diagnosis not present

## 2021-08-27 DIAGNOSIS — Z8572 Personal history of non-Hodgkin lymphomas: Secondary | ICD-10-CM | POA: Insufficient documentation

## 2021-08-27 DIAGNOSIS — I272 Pulmonary hypertension, unspecified: Secondary | ICD-10-CM

## 2021-08-27 LAB — BASIC METABOLIC PANEL
Anion gap: 12 (ref 5–15)
BUN: 92 mg/dL — ABNORMAL HIGH (ref 8–23)
CO2: 31 mmol/L (ref 22–32)
Calcium: 7.6 mg/dL — ABNORMAL LOW (ref 8.9–10.3)
Chloride: 94 mmol/L — ABNORMAL LOW (ref 98–111)
Creatinine, Ser: 1.69 mg/dL — ABNORMAL HIGH (ref 0.61–1.24)
GFR, Estimated: 39 mL/min — ABNORMAL LOW (ref >60)
Glucose, Bld: 90 mg/dL (ref 70–99)
Potassium: 3.1 mmol/L — ABNORMAL LOW (ref 3.5–5.1)
Sodium: 137 mmol/L (ref 135–145)

## 2021-08-27 LAB — CBC
HCT: 35.5 % — ABNORMAL LOW (ref 39.0–52.0)
Hemoglobin: 11.2 g/dL — ABNORMAL LOW (ref 13.0–17.0)
MCH: 28.4 pg (ref 26.0–34.0)
MCHC: 31.5 g/dL (ref 30.0–36.0)
MCV: 89.9 fL (ref 80.0–100.0)
Platelets: 319 10*3/uL (ref 150–400)
RBC: 3.95 MIL/uL — ABNORMAL LOW (ref 4.22–5.81)
RDW: 14.5 % (ref 11.5–15.5)
WBC: 7 10*3/uL (ref 4.0–10.5)
nRBC: 0 % (ref 0.0–0.2)

## 2021-08-27 LAB — TSH: TSH: 1.495 u[IU]/mL (ref 0.350–4.500)

## 2021-08-27 NOTE — Patient Instructions (Signed)
Labs done today. We will contact you only if your labs are abnormal.  No other medication changes were made. Please continue all current medications as prescribed.  Your physician recommends that you schedule a follow-up appointment in: 8 weeks  If you have any questions or concerns before your next appointment please send [place] a message through [place] or call our office at [phone].    TO LEAVE A MESSAGE FOR THE NURSE SELECT OPTION 2, PLEASE LEAVE A MESSAGE INCLUDING: YOUR NAME DATE OF BIRTH CALL BACK NUMBER REASON FOR CALL**this is important as we prioritize the call backs  YOU WILL RECEIVE A CALL BACK THE SAME DAY AS LONG AS YOU CALL BEFORE 4:00 PM   Do the following things EVERYDAY: Weigh yourself in the morning before breakfast. Write it down and keep it in a log. Take your medicines as prescribed Eat low salt foods--Limit salt (sodium) to 2000 mg per day.  Stay as active as you can everyday Limit all fluids for the day to less than 2 liters   At the [facility], you and your health needs are our priority. As part of our continuing mission to provide you with exceptional heart care, we have created designated Provider Care Teams. These Care Teams include your primary Cardiologist (physician) and Advanced Practice Providers (APPs- Physician Assistants and Nurse Practitioners) who all work together to provide you with the care you need, when you need it.   You may see any of the following providers on your designated Care Team at your next follow up: Dr Glori Bickers Dr Haynes Kerns, NP Lyda Jester, [place] Audry Riles, PharmD   Please be sure to bring in all your medications bottles to every appointment.

## 2021-08-28 ENCOUNTER — Telehealth (HOSPITAL_COMMUNITY): Payer: Self-pay | Admitting: Surgery

## 2021-08-28 MED ORDER — POTASSIUM CHLORIDE CRYS ER 20 MEQ PO TBCR: EXTENDED_RELEASE_TABLET | ORAL | 3 refills | Status: DC

## 2021-08-28 NOTE — Telephone Encounter (Signed)
-----  Message from Conrad [place], NP sent at 08/27/2021  4:47 PM EDT ----- Renal function stable. No change.  TSH stable.  K 3.1. Please call instruct to take extra 40 meq of potassium today and increase daily potassium to 40 meq in am and 20 meq in pm.   Please call.

## 2021-08-28 NOTE — Telephone Encounter (Signed)
I attempted to reach patient to review results and recommendations per provider.  I spoke with pts wife and gave her the message.  She assures me that she will deliver message to patient.  I have updated medlist in [facility].

## 2021-09-16 ENCOUNTER — Other Ambulatory Visit: Payer: Self-pay | Admitting: Adult Health

## 2021-09-16 ENCOUNTER — Other Ambulatory Visit: Payer: Self-pay

## 2021-09-16 ENCOUNTER — Telehealth (HOSPITAL_COMMUNITY): Payer: Self-pay

## 2021-09-16 ENCOUNTER — Telehealth (HOSPITAL_COMMUNITY): Payer: Self-pay | Admitting: *Deleted

## 2021-09-16 DIAGNOSIS — K2960 Other gastritis without bleeding: Secondary | ICD-10-CM

## 2021-09-16 MED ORDER — OMEPRAZOLE 20 MG PO CPDR: 20.0000 mg | DELAYED_RELEASE_CAPSULE | Freq: Two times a day (BID) | ORAL | 2 refills | Status: DC

## 2021-09-16 NOTE — Telephone Encounter (Signed)
Clearance for tooth  extraction faxed to [facility] by [facility]

## 2021-09-16 NOTE — Telephone Encounter (Signed)
Surgical clearance form and requested notes faxed to [facility] on 09/16/2021 @ 09.50am  per Dr. Aundra Dubin " ok to hold Eliquis for 2 days prior and day of procedure

## 2021-09-16 NOTE — Telephone Encounter (Signed)
Omeprazole refilled, Trason up to date on his office visits.

## 2021-09-24 ENCOUNTER — Other Ambulatory Visit (HOSPITAL_COMMUNITY): Payer: Self-pay | Admitting: Family Medicine

## 2021-09-24 NOTE — Progress Notes (Signed)
Carelink Summary Report / Loop Recorder

## 2021-09-28 ENCOUNTER — Ambulatory Visit (INDEPENDENT_AMBULATORY_CARE_PROVIDER_SITE_OTHER): Payer: Medicare HMO

## 2021-09-28 DIAGNOSIS — I639 Cerebral infarction, unspecified: Secondary | ICD-10-CM | POA: Diagnosis not present

## 2021-09-29 LAB — CUP PACEART REMOTE DEVICE CHECK
Date Time Interrogation Session: 20230829134942
Implantable Pulse Generator Implant Date: 20210325

## 2021-10-16 ENCOUNTER — Encounter (HOSPITAL_COMMUNITY): Payer: Medicare HMO | Admitting: Cardiology

## 2021-10-21 NOTE — Progress Notes (Signed)
Carelink Summary Report / Loop Recorder

## 2021-11-02 ENCOUNTER — Ambulatory Visit (INDEPENDENT_AMBULATORY_CARE_PROVIDER_SITE_OTHER): Payer: Medicare HMO

## 2021-11-02 DIAGNOSIS — I639 Cerebral infarction, unspecified: Secondary | ICD-10-CM | POA: Diagnosis not present

## 2021-11-03 LAB — CUP PACEART REMOTE DEVICE CHECK
Date Time Interrogation Session: 20231003110239
Implantable Pulse Generator Implant Date: 20210325

## 2021-11-05 ENCOUNTER — Telehealth (HOSPITAL_COMMUNITY): Payer: Self-pay

## 2021-11-05 ENCOUNTER — Encounter (HOSPITAL_COMMUNITY): Payer: Self-pay | Admitting: Cardiology

## 2021-11-05 ENCOUNTER — Other Ambulatory Visit: Payer: Self-pay | Admitting: Adult Health

## 2021-11-05 ENCOUNTER — Ambulatory Visit: Payer: Medicare HMO | Admitting: Adult Health

## 2021-11-05 ENCOUNTER — Ambulatory Visit (HOSPITAL_COMMUNITY)
Admission: RE | Admit: 2021-11-05 | Discharge: 2021-11-05 | Disposition: A | Discharge status: Home / Self Care | Payer: Medicare HMO | Source: Ambulatory Visit | Attending: Cardiology | Admitting: Cardiology

## 2021-11-05 ENCOUNTER — Encounter: Payer: Self-pay | Admitting: Adult Health

## 2021-11-05 VITALS — BP 108/64 | HR 74 | Wt 181.8 lb

## 2021-11-05 VITALS — BP 80/40 | HR 44 | Ht 64.0 in | Wt 182.6 lb

## 2021-11-05 DIAGNOSIS — Z79899 Other long term (current) drug therapy: Secondary | ICD-10-CM | POA: Diagnosis not present

## 2021-11-05 DIAGNOSIS — I959 Hypotension, unspecified: Secondary | ICD-10-CM

## 2021-11-05 DIAGNOSIS — I5032 Chronic diastolic (congestive) heart failure: Secondary | ICD-10-CM

## 2021-11-05 DIAGNOSIS — Z87891 Personal history of nicotine dependence: Secondary | ICD-10-CM | POA: Diagnosis not present

## 2021-11-05 DIAGNOSIS — Z950 Presence of cardiac pacemaker: Secondary | ICD-10-CM | POA: Insufficient documentation

## 2021-11-05 DIAGNOSIS — N1832 Chronic kidney disease, stage 3b: Secondary | ICD-10-CM | POA: Insufficient documentation

## 2021-11-05 DIAGNOSIS — Z9981 Dependence on supplemental oxygen: Secondary | ICD-10-CM | POA: Diagnosis not present

## 2021-11-05 DIAGNOSIS — J849 Interstitial pulmonary disease, unspecified: Secondary | ICD-10-CM | POA: Insufficient documentation

## 2021-11-05 DIAGNOSIS — Z7901 Long term (current) use of anticoagulants: Secondary | ICD-10-CM | POA: Diagnosis not present

## 2021-11-05 DIAGNOSIS — I130 Hypertensive heart and chronic kidney disease with heart failure and stage 1 through stage 4 chronic kidney disease, or unspecified chronic kidney disease: Secondary | ICD-10-CM | POA: Insufficient documentation

## 2021-11-05 DIAGNOSIS — I480 Paroxysmal atrial fibrillation: Secondary | ICD-10-CM | POA: Insufficient documentation

## 2021-11-05 DIAGNOSIS — I2721 Secondary pulmonary arterial hypertension: Secondary | ICD-10-CM | POA: Diagnosis not present

## 2021-11-05 DIAGNOSIS — R001 Bradycardia, unspecified: Secondary | ICD-10-CM | POA: Diagnosis not present

## 2021-11-05 DIAGNOSIS — G2581 Restless legs syndrome: Secondary | ICD-10-CM

## 2021-11-05 DIAGNOSIS — Z8673 Personal history of transient ischemic attack (TIA), and cerebral infarction without residual deficits: Secondary | ICD-10-CM | POA: Insufficient documentation

## 2021-11-05 DIAGNOSIS — G4733 Obstructive sleep apnea (adult) (pediatric): Secondary | ICD-10-CM | POA: Insufficient documentation

## 2021-11-05 DIAGNOSIS — I6521 Occlusion and stenosis of right carotid artery: Secondary | ICD-10-CM | POA: Insufficient documentation

## 2021-11-05 DIAGNOSIS — Z8249 Family history of ischemic heart disease and other diseases of the circulatory system: Secondary | ICD-10-CM | POA: Diagnosis not present

## 2021-11-05 DIAGNOSIS — R002 Palpitations: Secondary | ICD-10-CM | POA: Diagnosis not present

## 2021-11-05 LAB — BRAIN NATRIURETIC PEPTIDE: B Natriuretic Peptide: 61 pg/mL (ref 0.0–100.0)

## 2021-11-05 LAB — CBC
HCT: 35.4 % — ABNORMAL LOW (ref 39.0–52.0)
Hemoglobin: 11.4 g/dL — ABNORMAL LOW (ref 13.0–17.0)
MCH: 28.4 pg (ref 26.0–34.0)
MCHC: 32.2 g/dL (ref 30.0–36.0)
MCV: 88.1 fL (ref 80.0–100.0)
Platelets: 301 10*3/uL (ref 150–400)
RBC: 4.02 MIL/uL — ABNORMAL LOW (ref 4.22–5.81)
RDW: 14.6 % (ref 11.5–15.5)
WBC: 8.9 10*3/uL (ref 4.0–10.5)
nRBC: 0 % (ref 0.0–0.2)

## 2021-11-05 LAB — BASIC METABOLIC PANEL
Anion gap: 15 (ref 5–15)
BUN: 127 mg/dL — ABNORMAL HIGH (ref 8–23)
CO2: 28 mmol/L (ref 22–32)
Calcium: 7.2 mg/dL — ABNORMAL LOW (ref 8.9–10.3)
Chloride: 91 mmol/L — ABNORMAL LOW (ref 98–111)
Creatinine, Ser: 2.31 mg/dL — ABNORMAL HIGH (ref 0.61–1.24)
GFR, Estimated: 27 mL/min — ABNORMAL LOW (ref >60)
Glucose, Bld: 97 mg/dL (ref 70–99)
Potassium: 2.9 mmol/L — ABNORMAL LOW (ref 3.5–5.1)
Sodium: 134 mmol/L — ABNORMAL LOW (ref 135–145)

## 2021-11-05 MED ORDER — MIDODRINE HCL 5 MG PO TABS: 5.0000 mg | ORAL_TABLET | Freq: Three times a day (TID) | ORAL | 11 refills | Status: DC

## 2021-11-05 MED ORDER — POTASSIUM CHLORIDE CRYS ER 20 MEQ PO TBCR: 60.0000 meq | EXTENDED_RELEASE_TABLET | Freq: Every day | ORAL | 2 refills | Status: DC

## 2021-11-05 MED ORDER — METOLAZONE 2.5 MG PO TABS: 2.5000 mg | ORAL_TABLET | ORAL | 3 refills | Status: DC

## 2021-11-05 NOTE — Progress Notes (Signed)
PATIENT: Ryan [name] DOB: 10-Aug-1931  REASON FOR VISIT: follow up HISTORY FROM: patient  Chief Complaint  Patient presents with   Follow-up    Pt in 19 with wife   Pt states RLS is better since he was here for last office visit  Pt feet are swollen and red . Pt states feet do hurt him all the time  Pt states feet have been swollen for two weeks      HISTORY OF PRESENT ILLNESS: 11/05/21:   Ryan [name] is a 88 year old male with a history of RLS. He returns today for followup. Reports that he  Takes Requip every 6 hours. If he has break through pain he sometimes can take a walk or take a hot shower and that has been helping. Overall RLS is much improved.  Patient reports that current dose of requip has given him the most benefit and quality of life.  He is not interested in reducing his dose.  He does understand the risk of taking too much Requip.  Today the patient's blood pressure and heart rate is low.  He reports that his primary care increased metolazone to twice a week.  His wife reports that they have not seen cardiology in quite some time.  He denies feeling lightheaded or dizzy.  With the exception that he feels dizziness with standing.   Few weeks ago felt that he had a firecracker go off in his head. For several minutes he felt out of it but then returned to his baseline. No additional symptoms. No other episodes.     04/29/21: Ryan [name] is an 88 year old male with a history of RLS. He returns today for follow-up. Reports that his symptoms are still manageable. Continues to space out my 7 hours hours,typically taking 3-3.5 tablets daily. Sleeping varies- states that he is lucky if he sleeps 2 hours straight before he needs to go to the bathroom. Iron infusion last year resulted in a reaction so it had to be stopped. Uses Iron tablets now.  Continues to see cardiology for congestive heart failure.  He returns today for an evaluation.     10/30/20: Ryan [name] is an  88 year old male with a history of restless leg syndrome.  He returns today for follow-up.  The patient states that Requip 2 mg 3 times a day is working well for him.  He tries to space his dosing out by 7 hours.  States that if he tries to go longer he has breakthrough symptoms.  Typically will have an attack between 6 PM and midnight.  This is typically when he takes his third tablet.  He reports on occasion he may have to take a 4th tablet for breakthrough pain but this is not common.  The patient was recently in the hospital for anemia.  He received blood transfusion while there.  He will receive an iron transfusion next week.  He returns today for an evaluation.  06/25/20: Ryan [name] is an 88 year old male with a history of restless leg syndrome and chronic back pain.  He returns today for follow-up.  He is currently on Neupro 3 mg patch.  He was originally told to only take Requip 2 mg twice a day for breakthrough pain.  The patient was taking Requip 2 mg 3 times a day in addition to the Neupro patch.  Patient was advised to a telephone conversation that this was too much medication.  His dose of Requip was reduced  to 0.5 mg twice a day.  He remains on Neupro 3 mg patch.  The patient does not feel that Neupro has offered him much benefit.  He would like to go back to Requip.  The patient's iron level and ferritin level was low in April.  Lab work was completed by Dr. Claris Gladden office.  According to notes the patient was placed to have an iron infusion however the patient states that he was not aware of this and has been taking oral iron supplements.  Patient has a history of chronic back pain with previous surgery through Dr. Clarice Pole office.  He also reports that he was receiving injections with Dr. Ellene Route but did not find them beneficial.  He has not followed up with Dr. Ellene Route.  He reports that he has approximately 2 restless leg attacks during the day or night.  He states that his legs feel jumpy  and painful.  He also reports that during this time he notices some back pain.  Usually a hot shower offers him some benefit.  Reports that he is not sleeping well at night.  Returns today for an evaluation.  04/14/20: Ryan [name] is an 88 year old male with a history of restless leg syndrome and chronic back pain.  He returns today for follow-up.  He reports that he has not been taking the Neupro consistently.  Reports early on he would only place the patch on if he felt that he needed it.  He states for the last week he has been placing it consistently.  He takes Requip 2 mg up to 3 times a day.  He states that typically between 10 and 11 PM his symptoms will begin.  He describes it as a pins and needle sensation in the legs.  He states the hot shower used to help but it no longer does.  He states that the sensation is usually stop by 1 AM.  In the past he has been on gabapentin but was unable to tolerate it.  Patient is currently being monitored for his kidney function.  12/10/19: Ryan [name] is an 88 year old male with a history of restless leg syndrome and chronic back pain.  He returns today for follow-up.  The patient has been taking Requip and gabapentin but reports it does not offer him much benefit for his restless legs.  He states he can be up 3-4 times at night.  He also has symptoms throughout the day.  He states that gabapentin makes him very drowsy.  He has not taken it for the last 2 nights.  His B12 levels were low back in April.  He has been taking monthly B12 injections but has not had a shot in at least 2 months.  He returns today for an evaluation.   HISTORY 10/18/19:   Ryan [name] is an 88 year old male with a history of restless leg syndrome, CVA and chronic back pain.  He returns today to discuss restless legs.  He states that he is currently taking Requip 2 mg 3 times a day.  He states that he tries to take them 8 hours apart.  He states that he has been waking up during the night  with symptoms.  He states that this can occur anytime between 10 and 12 and sometimes will last till 4 in the morning.  He states that a hot shower is sometimes beneficial.  Initially tramadol offered him the most benefit but it no longer works.  He states that he does not  have any symptoms during the day.  He returns today for an evaluation.  REVIEW OF SYSTEMS: Out of a complete 14 system review of symptoms, the patient complains only of the following symptoms, and all other reviewed systems are negative.  See HPI  ALLERGIES: Allergies  Allergen Reactions   Pollen Extract-Tree Extract Other (See Comments)    "HEADACHES, TIRED, DRAINAGE FROM SINUSES"   Feraheme [Ferumoxytol] Other (See Comments)    Chest pain, facial flushing, shortness of breath   Molds & Smuts Other (See Comments)    Also dust mites causes sinus infections, h/a etc.    HOME MEDICATIONS: Outpatient Medications Prior to Visit  Medication Sig Dispense Refill   acetaminophen (TYLENOL) 325 MG tablet Take 325 mg by mouth in the morning, at noon, and at bedtime.     atorvastatin (LIPITOR) 40 MG tablet Take 1 tablet (40 mg total) by mouth daily at 6 PM. 30 tablet 1   cyanocobalamin (,VITAMIN B-12,) 1000 MCG/ML injection Inject 1,000 mcg into the muscle every 30 (thirty) days.     ELIQUIS 2.5 MG TABS tablet TAKE ONE TABLET TWICE DAILY 60 tablet 11   escitalopram (LEXAPRO) 10 MG tablet Take 10 mg by mouth daily.     finasteride (PROSCAR) 5 MG tablet Take 5 mg by mouth daily.     FLONASE SENSIMIST 27.5 MCG/SPRAY nasal spray Place 1 spray into the nose daily as needed for rhinitis or allergies.      gabapentin (NEURONTIN) 100 MG capsule TAKE ONE CAPSULE AT BEDTIME 30 capsule 1   Iron, Ferrous Sulfate, 325 (65 Fe) MG TABS Take 325 mg by mouth daily. 30 tablet 4   metolazone (ZAROXOLYN) 2.5 MG tablet Take 1 tablet (2.5 mg total) by mouth once a week. Every Sunday morning, 15 tablet 4   metoprolol succinate (TOPROL-XL) 25 MG 24 hr  tablet TAKE ONE-HALF TABLET AT BEDTIME 15 tablet 3   NON FORMULARY Take 1 tablet by mouth See admin instructions. Unnamed laxative: Take 1 tablet by mouth at bedtime as needed for iron-induced constipation     omeprazole (PRILOSEC) 20 MG capsule Take 1 capsule (20 mg total) by mouth 2 (two) times daily before a meal. 60 capsule 2   OXYGEN Inhale 2 L/min into the lungs. As needed and at night     potassium chloride SA (KLOR-CON M) 20 MEQ tablet Take 2 tablets (40 mEq total) by mouth in the morning AND 1 tablet (20 mEq total) every evening. Take 40 meq in AM and 20 meq in PM. 5 tablet 3   psyllium (METAMUCIL) 58.6 % packet Take 1 packet by mouth daily as needed.     Riociguat (ADEMPAS) 2.5 MG TABS Take 2.5 mg by mouth 3 (three) times daily. 90 tablet 11   rOPINIRole (REQUIP) 2 MG tablet TAKE ONE TABLET THREE TIMES DAILY 90 tablet 4   Selexipag (UPTRAVI) 1000 MCG TABS Take 2,000 mcg by mouth 2 (two) times daily. 360 tablet 3   torsemide (DEMADEX) 100 MG tablet TAKE ONE TABLET TWICE DAILY 180 tablet 3   traMADol (ULTRAM) 50 MG tablet Take 50 mg by mouth every 6 (six) hours. As needed     No facility-administered medications prior to visit.    PAST MEDICAL HISTORY: Past Medical History:  Diagnosis Date   Asthma    Barrett's esophagus    Benign prostatic hypertrophy    Bilateral lower extremity edema    CHF (congestive heart failure) (HCC)    Chronic fatigue  COPD (chronic obstructive pulmonary disease) (HCC)    Diabetes mellitus type 2 in nonobese ([place]) 04/24/2019   Diverticulosis of colon (without mention of hemorrhage)    Esophageal reflux    Gastritis    Gluten intolerance    Hiatal hernia 02/10/2011   Noted on CT Scan - Moderate Hiatal Hernia   Hyperlipemia    Hypertension    Lymphoma of lymph nodes in pelvis ([place]) 03/03/2011    Large Right Retroperitoneal Mass   Melanoma ([place])    lymphoma   Microcytic anemia 04/20/2011   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 Collene Mares   Pulmonary hyperinflation    Renal cyst 02/10/2011    Noted on CT Scan - Bilateral Renal Cysts   S/P radiation therapy 03/15/11 - 04/09/11   Abdominal/ Pelvic Tumor, 3600 cGy/20 Fractions   Sleep apnea     PAST SURGICAL HISTORY: Past Surgical History:  Procedure Laterality Date   ARTHROSCOPIC REPAIR ACL     right   BONE MARROW ASPIRATION  02/25/11   Bone Marrow, Aspirate, Clot, and Bilateral Bx, Right PIC   CATARACT EXTRACTION, BILATERAL     ESOPHAGOGASTRODUODENOSCOPY (EGD) WITH PROPOFOL N/A 10/19/2020   Procedure: ESOPHAGOGASTRODUODENOSCOPY (EGD) WITH PROPOFOL;  Surgeon: Sharyn Creamer, MD;  Location: [facility];  Service: Gastroenterology;  Laterality: N/A;   EYE SURGERY     HEMOSTASIS CLIP PLACEMENT  10/19/2020   Procedure: HEMOSTASIS CLIP PLACEMENT;  Surgeon: Sharyn Creamer, MD;  Location: [facility] ENDOSCOPY;  Service: Gastroenterology;;   HERNIA REPAIR     LEFT INGUINAL    INSERTION OF MESH N/A 08/23/2012   Procedure: INSERTION OF MESH;  Surgeon: Madilyn Hook, DO;  Location: [facility] ORS;  Service: General;  Laterality: N/A;   LOOP RECORDER INSERTION N/A 04/26/2019   Procedure: LOOP RECORDER INSERTION;  Surgeon: Evans Lance, MD;  Location: [facility] LAB;  Service: Cardiovascular;  Laterality: N/A;   LUMBAR LAMINECTOMY/DECOMPRESSION MICRODISCECTOMY Right 12/31/2013   Procedure: RIGHT L4-5 L5-S1 LAMINECTOMY;  Surgeon: Kristeen Miss, MD;  Location: [facility] NEURO ORS;  Service: Neurosurgery;  Laterality: Right;  RIGHT L4-5 L5-S1 LAMINECTOMY   POLYPECTOMY  10/19/2020   Procedure: POLYPECTOMY;  Surgeon: Sharyn Creamer, MD;  Location: [facility] ENDOSCOPY;  Service: Gastroenterology;;   RIGHT HEART CATH N/A 07/25/2018   Procedure: RIGHT HEART CATH;  Surgeon: Larey Dresser, MD;  Location: [facility];   Service: Cardiovascular;  Laterality: N/A;   ROTATOR CUFF REPAIR     right   TONSILLECTOMY     TONSILLECTOMY     VENTRAL HERNIA REPAIR N/A 08/23/2012   Procedure: HERNIA REPAIR VENTRAL ADULT;  Surgeon: Madilyn Hook, DO;  Location: [facility] OR[facility];  Service: General;  Laterality: N/A;    FAMILY HISTORY: Family History  Problem Relation Age of Onset   Dementia Mother    Heart disease Father        MI 61   Urticaria Father    Restless legs syndrome Father    Dementia Sister    Parkinson's disease Sister    ALS Sister    Prostate cancer Brother    Prostate cancer Paternal Uncle    Prostate cancer Paternal Uncle    Prostate cancer Paternal Uncle    Hyperlipidemia Other    Stroke Other    Hypertension Other  ADD / ADHD Other    Colon cancer Neg Hx    Allergic rhinitis Neg Hx    Asthma Neg Hx    Eczema Neg Hx     SOCIAL HISTORY: 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[employer], [employer]  Tobacco Use   Smoking status: Former    Packs/day: 1.00    Years: 35.00    Total pack years: 35.00    Types: Pipe, Cigarettes    Quit date: 02/23/1992    Years since quitting: 29.7   Smokeless tobacco: Never   Tobacco comments:    quit 20 years ago  Vaping Use   Vaping Use: Never used  Substance and Sexual Activity   Alcohol use: Not Currently    Comment: 2 drinks daily scotch  AND WINE WITH SUPPER   Drug use: No   Sexual activity: Not on file  Other Topics Concern   Not on file  Social History Narrative   Married - second marriage   He has two children   Retired Horticulturist, commercial Professor   Currently teaches pottery making, 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  Generalized: Well developed, in no acute distress   Neurological examination  Mentation: Alert oriented to time, place, history taking. Follows all commands speech and language fluent Cranial nerve II-XII: Pupils were equal round reactive to light. Extraocular movements were full, visual field were full on confrontational test.  Head turning and shoulder shrug  were normal and symmetric. Motor: The motor testing reveals 5 over 5 strength of all 4 extremities. Good symmetric motor tone is noted throughout. 4+ pitting edema bilateral lower extremities Sensory: Sensory testing is intact to soft touch on all 4 extremities. No evidence of extinction is noted.  Coordination: Cerebellar testing reveals good finger-nose-finger and heel-to-shin bilaterally.  Gait and station: Uses a Rollator when ambulating. Reflexes: Deep tendon reflexes are symmetric and normal bilaterally.   DIAGNOSTIC DATA (LABS, IMAGING, TESTING) - I reviewed patient records, labs, notes, testing and imaging myself where available.  Lab Results  Component Value Date   WBC 7.0 08/27/2021   HGB 11.2 (L) 08/27/2021   HCT 35.5 (L) 08/27/2021   MCV 89.9 08/27/2021   PLT 319 08/27/2021      Component Value Date/Time   NA 137 08/27/2021 1508   NA 139 12/10/2019 1007   NA 143 06/26/2015 0916   K 3.1 (L) 08/27/2021 1508   K 3.8 06/26/2015 0916   CL 94 (L) 08/27/2021 1508   CL 105 04/07/2012 0925   CO2 31 08/27/2021 1508  CO2 32 (H) 06/26/2015 0916   GLUCOSE 90 08/27/2021 1508   GLUCOSE 102  06/26/2015 0916   GLUCOSE 86 04/07/2012 0925   BUN 92 (H) 08/27/2021 1508   BUN 56 (H) 12/10/2019 1007   BUN 14.0 06/26/2015 0916   CREATININE 1.69 (H) 08/27/2021 1508   CREATININE 1.0 06/26/2015 0916   CALCIUM 7.6 (L) 08/27/2021 1508   CALCIUM 8.9 06/26/2015 0916   PROT 5.8 (L) 10/18/2020 0348   PROT 6.6 12/10/2019 1007   PROT 6.2 (L) 06/26/2015 0916   ALBUMIN 3.1 (L) 10/18/2020 0348   ALBUMIN 4.4 12/10/2019 1007   ALBUMIN 3.5 06/26/2015 0916   AST 13 (L) 10/18/2020 0348   AST 17 06/26/2015 0916   ALT 11 10/18/2020 0348   ALT 15 06/26/2015 0916   ALKPHOS 79 10/18/2020 0348   ALKPHOS 87 06/26/2015 0916   BILITOT 0.6 10/18/2020 0348   BILITOT <0.2 12/10/2019 1007   BILITOT 0.50 06/26/2015 0916   GFRNONAA 39 (L) 08/27/2021 1508   GFRAA 32 (L) 12/10/2019 1007   Lab Results  Component Value Date   CHOL 159 09/24/2019   HDL 45 09/24/2019   LDLCALC 82 09/24/2019   LDLDIRECT 165.7 12/23/2008   TRIG 159 (H) 09/24/2019   CHOLHDL 3.5 09/24/2019   Lab Results  Component Value Date   HGBA1C 5.9 (H) 10/17/2020   Lab Results  Component Value Date   VITAMINB12 817 12/10/2019   Lab Results  Component Value Date   TSH 1.495 08/27/2021      ASSESSMENT AND PLAN 88 y.o. year old male  has a past medical history of Asthma, Barrett's esophagus, Benign prostatic hypertrophy, Bilateral lower extremity edema, CHF (congestive heart failure) ([place]), Chronic fatigue, COPD (chronic obstructive pulmonary disease) ([place]), Diabetes mellitus type 2 in nonobese ([place]) (04/24/2019), Diverticulosis of colon (without mention of hemorrhage), Esophageal reflux, Gastritis, Gluten intolerance, Hiatal hernia (02/10/2011), Hyperlipemia, Hypertension, Lymphoma of lymph nodes in pelvis ([place]) (03/03/2011), Melanoma ([place]), Microcytic anemia (04/20/2011), NHL (non-Hodgkin's lymphoma) ([place]), Osteoarthritis, Periaortic lymphadenopathy (02/16/2011), Personal history of colonic polyps (2004), Pulmonary hyperinflation,  Renal cyst (02/10/2011), S/P radiation therapy (03/15/11 - 04/09/11), and Sleep apnea. here with:  1.  Restless leg syndrome 2.  Hypotension and bradycardia   Continue Requip 2 mg 3 times a day.  Okay to take an 0.5-1 tablet for breakthrough symptoms daily if needed BP and HR low-reached out to his PCP who recommended that he follow-up with cardiology.  Sent message to RN at cardiology office they will be seeing him at 12 PM today. Patient did not want to do any further work-up in regards to the episode that he describes as a firecracker going off in his head.  We discussed doing imaging but he deferred for now.  Advised that if he has any additional episodes to call 911 or let us know. Certainly if symptoms worsen he was advised to let us know He will follow-up in 6 months or sooner if needed   Ward Givens, MSN, NP-C 11/05/2021, 10:19 AM [facility] [address] [place], [place] [address] [phone]

## 2021-11-05 NOTE — Patient Instructions (Signed)
Medication Changes:  Take Midodrine 5 mg tablet by mouth 3 times a day.  Lab Work:  Labs today We will only contact you if something comes back abnormal or we need to make some changes. Otherwise no news is good news!    Follow-Up in:  In 1 month with a Nurse Practitioner/Physicians Assistant  At the [facility], you and your health needs are our priority. We have a designated team specialized in the treatment of Heart Failure. This Care Team includes your primary Heart Failure Specialized Cardiologist (physician), Advanced Practice Providers (APPs- Physician Assistants and Nurse Practitioners), and Pharmacist who all work together to provide you with the care you need, when you need it.   You may see any of the following providers on your designated Care Team at your next follow up:  Dr. Glori Bickers Dr. Loralie Champagne Dr. Roxana Hires, NP Lyda Jester, [place] [facility] [place], [place] Forestine Na, NP Audry Riles, PharmD   Please be sure to bring in all your medications bottles to every appointment.   Need to Contact us:  If you have any questions or concerns before your next appointment please send [place] a message through [place] or call our office at [phone].    TO LEAVE A MESSAGE FOR THE NURSE SELECT OPTION 2, PLEASE LEAVE A MESSAGE INCLUDING: YOUR NAME DATE OF BIRTH CALL BACK NUMBER REASON FOR CALL**this is important as we prioritize the call backs  YOU WILL RECEIVE A CALL BACK THE SAME DAY AS LONG AS YOU CALL BEFORE 4:00 PM

## 2021-11-05 NOTE — Telephone Encounter (Signed)
Patient advised and verbalized understanding,lab appointment scheduled,lab orders entered.F/u moved up, Med list updated to reflect changes.  Meds ordered this encounter  Medications   metolazone (ZAROXOLYN) 2.5 MG tablet    Sig: Take 1 tablet (2.5 mg total) by mouth once a week.    Dispense:  12 tablet    Refill:  3    Please cancel all previous orders for current medication. Change in dosage or pill size.   potassium chloride SA (KLOR-CON M) 20 MEQ tablet    Sig: Take 3 tablets (60 mEq total) by mouth daily. With an additional 20 meq once a week for metolazone    Dispense:  94 tablet    Refill:  2    Please cancel all previous orders for current medication. Change in dosage or pill size.   Orders Placed This Encounter  Procedures   Basic metabolic panel    Standing Status:   Future    Standing Expiration Date:   11/06/2022    Order Specific Question:   Release to patient    Answer:   Immediate

## 2021-11-05 NOTE — Telephone Encounter (Signed)
-----  Message from Larey Dresser, MD sent at 11/05/2021  1:57 PM EDT ----- Hold torsemide x 1 day then resume.  Decrease metolazone to once weekly, do not take again until next week. Take an extra 40 mEq KCl today and increase daily KCl by 20 mEq after that.  BMET in 1 week.  Move followup to 2 wks with APP to reassess volume.

## 2021-11-05 NOTE — Progress Notes (Signed)
PCP: Roetta Sessions, NP Cardiology: Dr. Aundra Dubin  88 y.o. with history of interstitial lung disease (?NSIP vs UIP) on home oxygen, pulmonary hypertension, diastolic CHF, and prior non-Hodgkins lymphoma (pelvic mass, s/p XRT) presents for followup of CHF and pulmonary hypertension.  Patient has history of reportedly mild ILD (NSIP vs UIP) followed by a pulmonologist at [facility] and pulmonary hypertension thought to be out of proportion lung disease followed by a physician at [facility].  Last CT chest was in 11/19, showing stable ILD (?UIP vs NSIP).  He had RHC in 2/20 with moderate PAH, PVR 5.1 WU.  He was tried on Adcirca but developed cognitive problems and worsening of peripheral edema so this was stopped.  He has been on home oxygen, uses at night and with exertion.    He was admitted in 6/20 with worsening dyspnea and peripheral edema, weight up despite compliance with torsemide 40 mg bid at home.  He has CKD stage 3 and torsemide was decreased in the spring. He was diuresed aggressively with fall in weight.  Echo showed normal LV size and systolic function, RV reportedly normal.  6/20 RHC showed normal filling pressures with moderate pulmonary hypertension, PVR 5.35 WU.   After discharge from hospital, patient started on ambrisentan.  His weight steadily began to increase and he developed worsening lower extremity edema, we stopped ambrisentan.   Patient was admitted in 8/20 with AKI after taking metolazone for several days.  He was admitted again in 8/20 with acute on chronic diastolic CHF. He was diuresed.  Wandering atrial pacemaker noted.   He was admitted in 3/21 with suspected CVA, MRI head showed punctate occipital lobe infarct, thought to be due to small vessel disease. ILR was implanted.  Echo in 3/21 showed EF 60-65%, severe LVH, normal RV.  PYP scan in 3/21 was not suggestive of TTR amyloidosis.  Subsequently, ILR has shown paroxysmal atrial fibrillation.   He was admitted later in  3/21 with AKI, creatinine up to 5.  However, he corrected rapidly to baseline with IVF and creatinine was 1.02 when discharge.   Echo in 6/22 showed EF 65-70% with mild LVH, normal RV, IVC normal, PASP estimated 29 mmHg.   Patient was admitted in 9/22 with upper GI bleed from bleeding gastric polyps.  He had polypectomy and has been restarted on Eliquis.   He was seen in the office 10/23/20 and BMET showed creatinine up to 4.18.  He was also noted to be Fe deficient.  Torsemide, metolazone, and spironolactone were stopped. Follow up he felt better. Planned to resume torsemide in a couple days, continued to hold spiro and metolazone. Feraheme infusions arranged.  He was seen in the ER with atrial fibrillation in 12/22.    Echo in 7/23 showed EF 60-65%, RV normal, PASP estimation was not elevated, IVC normal.   Patient returns for followup of HCF and atrial fibrillation.  HE is in NSR today.  He has had low BP recently, was noted to have SBP in 70s at neurology office today though he is 108/64 in our office currently.  He does routinely have SBP in 80s without orthostatic symptoms.  No falls. His ankles still swell significantly. He cannot wear compression stockings.  He is short of breath walking more than about 100 feet chronically.  No chest pain.  No orthopnea/PND.  His PCP recently increased his metolazone to bid, he thinks his BP has been lower since then.   ECG (personally reviewed): NSR, iRBBB, PACs  6 minute walk (8/21): 122 m 6 minute walk (11/21): 244 m 6 minute walk (3/22): 122 m  Labs (6/20): K 4.7, creatinine 1.46 Labs (7/20): K 3.1, creatinine 1.67 Labs (8/20): K 3.9, creatinine 1.8 Labs (9/20): K 4.9, creatinine 1.76, BNP 78 Labs (10/20): K 4.2, creatininine 1.5 Labs (11/20): BNP 36, K 4.3, creatinine 1.4 Labs (3/21): myeloma panel negative, urine immunofixation negative.  Labs (4/21): K 4.5, creatinine 1.02, hgb 11.4 Labs (6/21): K 5.1, creatinine 2.2, hgb 10.4 Labs (8/21):  LDL 82 Labs (11/21): K 5.3, creatinine 2.11 Labs (3/22): K 3.9, creatinine 2.31 Labs (4/22): K 4.1, creatinine 2.15 Labs (9/22): hgb 8.6 => 9.1, creatinine 1.5 => 4.18, BUN 85, BNP 38.5 Labs (11/22): K 4.0, creatinine 1.7 Labs (12/22): BNP 140, K 3.7, creatinine 1.87, hgb 12.1 Labs (1/23): K 4.0, creatinine 1.44 Labs (5/23): K 3.7, creatinine 1.6 Labs (7/23): TSH normal, K 3.1, creatinine 1.69, hgb 11.2  PMH: 1. Non-Hodgkin's lymphoma: Pelvic involvement.  S/p radiation.  Thought to be in remission (Dr. Benay Spice).  2. HTN 3. PACs/PVCs 4. GERD: Barrett's esophagus.  5. 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  - High resolution CT chest 11/19: ILD, ?UIP pattern.  6. Pulmonary hypertension: Thought to be out of proportion to underlying lung disease (ILD, ?NSIP vs UIP).   - RHC (2/20): mean RA 10, PASP 60 with mean 42, mean PCWP 11, CI 3, PVR 5.1 WU.  - Trial of Adcirca => failed due to ?memory worsening/cognitive affects and peripheral edema.  - Echo (6/20): EF 60-65%, mild LVH, RV reportedly normal, PASP 59 mmHg.  - RHC (6/20): mean RA 7, PA 50/24 mean 34, mean PCWP 12, CI 2.01, PAPI 3.7, PVR 5.35 WU - RF negative, ANA negative, anti-SCL70 negative, anti-centromere negative.  - V/Q scan (8/20): No evidence for chronic PE.  - Echo (3/21): EF 60-65%, severe LVH, normal RV.  - Echo (6/22): EF 65-70% with mild LVH, normal RV, IVC normal, PASP estimated 29 mmHg. - Echo (7/23): EF 60-65%, RV normal, PASP estimation was not elevated, IVC normal.  7. CKD: Stage 3.   8. Wandering atrial pacemaker 9. OSA 10. PYP scan (3/21): H/CL ratio 1.0, grade 0.  11. CVA: MRI 3/21 with punctate occipital lobe infarct thought to be due to small vessel disease. - Carotid dopplers (3/21): 40-59% RICA stenosis.  - Carotid dopplers (4/22): 40-59% RICA stenosis.  - Carotid dopplers (4/23): 40-59% RICA stenosis.  12. Atrial fibrillation: Paroxysmal, noted on ILR.  13. GI bleeding 9/22 from gastric polyps.  14.  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  Tobacco Use   Smoking status: Former    Packs/day: 1.00    Years: 35.00    Total pack years: 35.00    Types: Pipe, Cigarettes    Quit date: 02/23/1992    Years since quitting: 29.7   Smokeless tobacco: Never   Tobacco comments:    quit 20 years ago  Vaping Use   Vaping Use: Never used  Substance and Sexual Activity   Alcohol use: Not Currently    Comment: 2 drinks daily scotch  AND WINE WITH SUPPER   Drug use: No   Sexual activity: Not on file  Other Topics Concern   Not on file  Social History Narrative   Married - second marriage   He has two children   Retired Horticulturist, commercial Professor   Currently teaches pottery making, 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[facility]   Former Smoker quit 12 years ago- smoked for 35 years   Alcohol use- not currently   Right handed   Social Determinants of Radio broadcast assistant Strain: Not on file  Food Insecurity: Not on file  Transportation Needs: Not on file  Physical Activity: Not on file  Stress: Not on file  Social Connections: Not on file  Intimate Partner Violence: Not on file   Family History  Problem Relation Age of Onset   Dementia Mother    Heart disease Father        MI 44   Urticaria Father    Restless legs syndrome Father    Dementia Sister    Parkinson's disease Sister    ALS Sister    Prostate cancer Brother    Prostate cancer Paternal Uncle    Prostate cancer Paternal Uncle    Prostate cancer Paternal Uncle    Hyperlipidemia Other    Stroke Other    Hypertension Other    ADD / ADHD Other    Colon cancer Neg Hx    Allergic rhinitis Neg Hx    Asthma Neg Hx    Eczema Neg Hx    ROS: All systems reviewed and  negative except as per HPI.   Current Outpatient Medications  Medication Sig Dispense Refill   acetaminophen (TYLENOL) 325 MG tablet Take 325 mg by mouth in the morning, at noon, and at bedtime.     atorvastatin (LIPITOR) 40 MG tablet Take 1 tablet (40 mg total) by mouth daily at 6 PM. 30 tablet 1   cyanocobalamin (,VITAMIN B-12,) 1000 MCG/ML injection Inject 1,000 mcg into the muscle every 30 (thirty) days.     ELIQUIS 2.5 MG TABS tablet TAKE ONE TABLET TWICE DAILY 60 tablet 11   escitalopram (LEXAPRO) 10 MG tablet Take 10 mg by mouth daily.     finasteride (PROSCAR) 5 MG tablet Take 5 mg by mouth daily.     FLONASE SENSIMIST 27.5 MCG/SPRAY nasal spray Place 1 spray into the nose daily as needed for rhinitis or allergies.      Iron, Ferrous Sulfate, 325 (65 Fe) MG TABS Take 325 mg by mouth daily. 30 tablet 4   metoprolol succinate (TOPROL-XL) 25 MG 24 hr tablet TAKE ONE-HALF TABLET AT BEDTIME 15 tablet 3   midodrine (PROAMATINE) 5 MG tablet Take 1 tablet (5 mg total) by mouth 3 (three) times daily with meals. 90 tablet 11   NON FORMULARY Take 1 tablet by mouth See admin instructions. Unnamed laxative: Take 1 tablet by mouth at bedtime as needed for iron-induced constipation     omeprazole (PRILOSEC) 20 MG capsule Take 1 capsule (20 mg total) by mouth 2 (two) times daily before a meal. 60 capsule 2   OXYGEN Inhale 2 L/min into the lungs. As needed and at night     psyllium (METAMUCIL) 58.6 % packet Take 1 packet by mouth daily as needed.     Riociguat (ADEMPAS) 2.5 MG TABS Take 2.5 mg by mouth 3 (three) times daily. 90 tablet 11   rOPINIRole (REQUIP) 2 MG tablet Take 2 mg by mouth 4 (four) times daily.     Selexipag (UPTRAVI) 1000 MCG TABS Take 2,000 mcg by mouth 2 (two)  times daily. 360 tablet 3   torsemide (DEMADEX) 100 MG tablet TAKE ONE TABLET TWICE DAILY 180 tablet 3   traMADol (ULTRAM) 50 MG tablet Take 50 mg by mouth every 6 (six) hours. As needed     gabapentin (NEURONTIN) 100 MG  capsule TAKE ONE CAPSULE AT BEDTIME 30 capsule 1   metolazone (ZAROXOLYN) 2.5 MG tablet Take 1 tablet (2.5 mg total) by mouth once a week. 12 tablet 3   potassium chloride SA (KLOR-CON M) 20 MEQ tablet Take 3 tablets (60 mEq total) by mouth daily. With an additional 20 meq once a week for metolazone 94 tablet 2   No current facility-administered medications for this encounter.   BP 108/64   Pulse 74   Wt 82.5 kg (181 lb 12.8 oz)   SpO2 97%   BMI 31.21 kg/m   Wt Readings from Last 3 Encounters:  11/05/21 82.5 kg (181 lb 12.8 oz)  11/05/21 82.8 kg (182 lb 9.6 oz)  08/27/21 79.9 kg (176 lb 3.2 oz)  General: NAD Neck: JVP 7-8 cm, no thyromegaly or thyroid nodule.  Lungs: Clear to auscultation bilaterally with normal respiratory effort. CV: Nondisplaced PMI.  Heart regular S1/S2, no S3/S4, no murmur.  2+ edema at ankles.  No carotid bruit.  Unable to palpate pedal pulses.  Abdomen: Soft, nontender, no hepatosplenomegaly, no distention.  Skin: Intact without lesions or rashes.  Neurologic: Alert and oriented x 3.  Psych: Normal affect. Extremities: No clubbing or cyanosis.  HEENT: Normal.   Assessment/Plan: 1.Chronic diastolic CHF: Suspect with significant component of RV failure from pulmonary hypertension.  Echo in 6/20 with EF 60-65%, RV not well-visualized but PASP 59 mmHg.  Echo (3/21) with EF 60-65%, severe LVH, normal RV, PASP 28 mmHg.  PYP scan was not suggestive of TTR cardiac amyloidosis. Echo in 6/22 showed EF 65-70% with mild LVH, normal RV, IVC normal, PASP estimated 29 mmHg.  Stable NYHA class III symptoms. He does not look significantly volume overloaded (minimal JVD), ankle edema is chronic and likely due in part to a significant component of venous insufficiency at this point.  BP continues to run low though he is minimal symptomatic.  - Unable to wear compression stockings or elevate legs due to RLS.  - Decrease metolazone back to once weekly.  Check BMET/BNP today.  -  Continue torsemide 100 mg bid.  - Continue Toprol XL 12.5 mg nightly. - Start midodrine 5 mg tid to promote higher BP in setting of RV failure.  2. CKD Stage 3b: BMET today. 3. Pulmonary hypertension: Patient thought to have Media out of proportion to his lung disease (ILD, NSIP versus UIP). RHC in 2/20 showed mean PA pressure 41, PVR 5.1 WU, CI 3. Echo in 6/20 with RV reportedly ok => I reviewed and do not think that RV was well-visualized.  Patient did not tolerate Adcirca due to edema/?cognitive effects.  RHC was done again in 6/20, showed moderate pulmonary arterial hypertension with PVR 5.35 (comparable to prior).  Serological workup for PH was negative. Patient started ambrisentan 5 mg and developed significant edema/volume overload despite a high dose of torsemide. He is now on Uptravi + riociguat.  Most recent echo in 7/23 showed normal RV size/systolic function and PA systolic pressure estimate was normal.   - He will stay off ambrisentan (? trigger for volume overload).  - As above, he did not tolerate tadalafil.  - He is at 2000 mcg bid Uptravi.  With excellent echo in 7/23, will  keep at this dose.   - Continue riociguat at goal dose 2.5 mg tid.  4. Atrial fibrillation: Paroxysmal, noted on ILR. NSR today - Continue apixaban 2.5 mg bid (dosed for age, renal dysfunction).  5. OSA: Cannot tolerate CPAP.  6. CVA: In 3/21, may have been atrial fibrillation-related.  On apixaban now.  7. Carotid stenosis: Carotid duplex 04/23: 40-59% right ICA stenosis, no significant stenosis on the left - Continue statin. - Repeat study in 1 year.  8. Interstitial lung disease:  Has seen pulmonary, ILD seems mild and stable.   Followup 1 month with APP.   Loralie Champagne 11/05/2021

## 2021-11-12 ENCOUNTER — Ambulatory Visit (HOSPITAL_COMMUNITY)
Admission: RE | Admit: 2021-11-12 | Discharge: 2021-11-12 | Disposition: A | Discharge status: Home / Self Care | Payer: Medicare HMO | Source: Ambulatory Visit | Attending: Internal Medicine | Admitting: Internal Medicine

## 2021-11-12 DIAGNOSIS — I5032 Chronic diastolic (congestive) heart failure: Secondary | ICD-10-CM | POA: Diagnosis present

## 2021-11-12 LAB — BASIC METABOLIC PANEL
Anion gap: 11 (ref 5–15)
BUN: 94 mg/dL — ABNORMAL HIGH (ref 8–23)
CO2: 28 mmol/L (ref 22–32)
Calcium: 7.4 mg/dL — ABNORMAL LOW (ref 8.9–10.3)
Chloride: 98 mmol/L (ref 98–111)
Creatinine, Ser: 1.76 mg/dL — ABNORMAL HIGH (ref 0.61–1.24)
GFR, Estimated: 37 mL/min — ABNORMAL LOW (ref >60)
Glucose, Bld: 93 mg/dL (ref 70–99)
Potassium: 3.5 mmol/L (ref 3.5–5.1)
Sodium: 137 mmol/L (ref 135–145)

## 2021-11-17 NOTE — Progress Notes (Signed)
Carelink Summary Report / Loop Recorder

## 2021-11-18 ENCOUNTER — Other Ambulatory Visit (HOSPITAL_COMMUNITY): Payer: Self-pay | Admitting: Cardiology

## 2021-11-20 NOTE — Progress Notes (Signed)
PCP: Roetta Sessions, NP Cardiology: Dr. Aundra Dubin  88 y.o. with history of interstitial lung disease (?NSIP vs UIP) on home oxygen, pulmonary hypertension, diastolic CHF, and prior non-Hodgkins lymphoma (pelvic mass, s/p XRT) presents for followup of CHF and pulmonary hypertension.  Patient has history of reportedly mild ILD (NSIP vs UIP) followed by a pulmonologist at [facility] and pulmonary hypertension thought to be out of proportion lung disease followed by a physician at [facility].  Last CT chest was in 11/19, showing stable ILD (?UIP vs NSIP).  He had RHC in 2/20 with moderate PAH, PVR 5.1 WU.  He was tried on Adcirca but developed cognitive problems and worsening of peripheral edema so this was stopped.  He has been on home oxygen, uses at night and with exertion.    He was admitted in 6/20 with worsening dyspnea and peripheral edema, weight up despite compliance with torsemide 40 mg bid at home.  He has CKD stage 3 and torsemide was decreased in the spring. He was diuresed aggressively with fall in weight.  Echo showed normal LV size and systolic function, RV reportedly normal.  6/20 RHC showed normal filling pressures with moderate pulmonary hypertension, PVR 5.35 WU.   After discharge from hospital, patient started on ambrisentan.  His weight steadily began to increase and he developed worsening lower extremity edema, we stopped ambrisentan.   Patient was admitted in 8/20 with AKI after taking metolazone for several days.  He was admitted again in 8/20 with acute on chronic diastolic CHF. He was diuresed.  Wandering atrial pacemaker noted.   He was admitted in 3/21 with suspected CVA, MRI head showed punctate occipital lobe infarct, thought to be due to small vessel disease. ILR was implanted.  Echo in 3/21 showed EF 60-65%, severe LVH, normal RV.  PYP scan in 3/21 was not suggestive of TTR amyloidosis.  Subsequently, ILR has shown paroxysmal atrial fibrillation.   He was admitted later in  3/21 with AKI, creatinine up to 5.  However, he corrected rapidly to baseline with IVF and creatinine was 1.02 when discharge.   Echo in 6/22 showed EF 65-70% with mild LVH, normal RV, IVC normal, PASP estimated 29 mmHg.   Patient was admitted in 9/22 with upper GI bleed from bleeding gastric polyps.  He had polypectomy and has been restarted on Eliquis.   He was seen in the office 10/23/20 and BMET showed creatinine up to 4.18.  He was also noted to be Fe deficient.  Torsemide, metolazone, and spironolactone were stopped. Follow up he felt better. Planned to resume torsemide in a couple days, continued to hold spiro and metolazone. Feraheme infusions arranged.  He was seen in the ER with atrial fibrillation in 12/22.    Echo in 7/23 showed EF 60-65%, RV normal, PASP estimation was not elevated, IVC normal.   Seen 10/23 for an acute visit when BP 70's and HR 44 at Neurology visit. Kidney function elevated and metolazone decreased back to once a week. Midodrine added.  Today he returns for HF follow up, with his wife. Overall feeling fine. Main complaint continues to be RLS. Leg swelling has improved some. Able to walk short distances with a rolling walker at his facility without dyspnea. Denies palpitations, abnormal bleeding, CP, dizziness, or PND/Orthopnea. Appetite ok. No fever or chills. Weight facility stable. Taking all medications provided by facility.   ECG (personally reviewed): none ordered today.  6 minute walk (8/21): 122 m 6 minute walk (11/21): 244 m 6  minute walk (3/22): 122 m  Labs (6/20): K 4.7, creatinine 1.46 Labs (7/20): K 3.1, creatinine 1.67 Labs (8/20): K 3.9, creatinine 1.8 Labs (9/20): K 4.9, creatinine 1.76, BNP 78 Labs (10/20): K 4.2, creatininine 1.5 Labs (11/20): BNP 36, K 4.3, creatinine 1.4 Labs (3/21): myeloma panel negative, urine immunofixation negative.  Labs (4/21): K 4.5, creatinine 1.02, hgb 11.4 Labs (6/21): K 5.1, creatinine 2.2, hgb 10.4 Labs  (8/21): LDL 82 Labs (11/21): K 5.3, creatinine 2.11 Labs (3/22): K 3.9, creatinine 2.31 Labs (4/22): K 4.1, creatinine 2.15 Labs (9/22): hgb 8.6 => 9.1, creatinine 1.5 => 4.18, BUN 85, BNP 38.5 Labs (11/22): K 4.0, creatinine 1.7 Labs (12/22): BNP 140, K 3.7, creatinine 1.87, hgb 12.1 Labs (1/23): K 4.0, creatinine 1.44 Labs (5/23): K 3.7, creatinine 1.6 Labs (7/23): TSH normal, K 3.1, creatinine 1.69, hgb 11.2 Labs (10/23): K 3.5, creatinine 1.76  PMH: 1. Non-Hodgkin's lymphoma: Pelvic involvement.  S/p radiation.  Thought to be in remission (Dr. Benay Spice).  2. HTN 3. PACs/PVCs 4. GERD: Barrett's esophagus.  5. 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  - High resolution CT chest 11/19: ILD, ?UIP pattern.  6. Pulmonary hypertension: Thought to be out of proportion to underlying lung disease (ILD, ?NSIP vs UIP).   - RHC (2/20): mean RA 10, PASP 60 with mean 42, mean PCWP 11, CI 3, PVR 5.1 WU.  - Trial of Adcirca => failed due to ?memory worsening/cognitive affects and peripheral edema.  - Echo (6/20): EF 60-65%, mild LVH, RV reportedly normal, PASP 59 mmHg.  - RHC (6/20): mean RA 7, PA 50/24 mean 34, mean PCWP 12, CI 2.01, PAPI 3.7, PVR 5.35 WU - RF negative, ANA negative, anti-SCL70 negative, anti-centromere negative.  - V/Q scan (8/20): No evidence for chronic PE.  - Echo (3/21): EF 60-65%, severe LVH, normal RV.  - Echo (6/22): EF 65-70% with mild LVH, normal RV, IVC normal, PASP estimated 29 mmHg. - Echo (7/23): EF 60-65%, RV normal, PASP estimation was not elevated, IVC normal.  7. CKD: Stage 3.   8. Wandering atrial pacemaker 9. OSA 10. PYP scan (3/21): H/CL ratio 1.0, grade 0.  11. CVA: MRI 3/21 with punctate occipital lobe infarct thought to be due to small vessel disease. - Carotid dopplers (3/21): 40-59% RICA stenosis.  - Carotid dopplers (4/22): 40-59% RICA stenosis.  - Carotid dopplers (4/23): 40-59% RICA stenosis.  12. Atrial fibrillation: Paroxysmal, noted on ILR.  13. GI  bleeding 9/22 from gastric polyps.  14. 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  Tobacco Use   Smoking status: Former    Packs/day: 1.00    Years: 35.00    Total pack years: 35.00    Types: Pipe, Cigarettes    Quit date: 02/23/1992    Years since quitting: 29.7   Smokeless tobacco: Never   Tobacco comments:    quit 20 years ago  Vaping Use   Vaping Use: Never used  Substance and Sexual Activity   Alcohol use: Not Currently    Comment: 2 drinks daily scotch  AND WINE WITH SUPPER   Drug use: No   Sexual activity: Not on file  Other Topics Concern   Not on  file  Social History Narrative   Married - second marriage   He has two children   Retired Horticulturist, commercial Professor   Currently teaches pottery making, 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[facility]   Former Smoker quit 12 years ago- smoked for 35 years   Alcohol use- not currently   Right handed   Social Determinants of Radio broadcast assistant Strain: Not on file  Food Insecurity: Not on file  Transportation Needs: Not on file  Physical Activity: Not on file  Stress: Not on file  Social Connections: Not on file  Intimate Partner Violence: Not on file   Family History  Problem Relation Age of Onset   Dementia Mother    Heart disease Father        MI 44   Urticaria Father    Restless legs syndrome Father    Dementia Sister    Parkinson's disease Sister    ALS Sister    Prostate cancer Brother    Prostate cancer Paternal Uncle    Prostate cancer Paternal Uncle    Prostate cancer Paternal Uncle    Hyperlipidemia Other    Stroke Other    Hypertension Other    ADD / ADHD Other    Colon cancer Neg Hx    Allergic rhinitis Neg Hx    Asthma Neg Hx    Eczema Neg  Hx    ROS: All systems reviewed and negative except as per HPI.   Current Outpatient Medications  Medication Sig Dispense Refill   acetaminophen (TYLENOL) 325 MG tablet Take 325 mg by mouth in the morning, at noon, and at bedtime.     atorvastatin (LIPITOR) 40 MG tablet Take 1 tablet (40 mg total) by mouth daily at 6 PM. 30 tablet 1   cyanocobalamin (,VITAMIN B-12,) 1000 MCG/ML injection Inject 1,000 mcg into the muscle every 30 (thirty) days.     ELIQUIS 2.5 MG TABS tablet TAKE ONE TABLET TWICE DAILY 60 tablet 11   escitalopram (LEXAPRO) 10 MG tablet Take 10 mg by mouth daily.     finasteride (PROSCAR) 5 MG tablet Take 5 mg by mouth daily.     FLONASE SENSIMIST 27.5 MCG/SPRAY nasal spray Place 1 spray into the nose daily as needed for rhinitis or allergies.      gabapentin (NEURONTIN) 100 MG capsule TAKE ONE CAPSULE AT BEDTIME 30 capsule 1   Iron, Ferrous Sulfate, 325 (65 Fe) MG TABS Take 325 mg by mouth daily. 30 tablet 4   metolazone (ZAROXOLYN) 2.5 MG tablet Take 1 tablet (2.5 mg total) by mouth once a week. 12 tablet 3   metoprolol succinate (TOPROL-XL) 25 MG 24 hr tablet TAKE ONE-HALF TABLET AT BEDTIME 15 tablet 3   midodrine (PROAMATINE) 5 MG tablet Take 1 tablet (5 mg total) by mouth 3 (three) times daily with meals. 90 tablet 11   NON FORMULARY Take 1 tablet by mouth See admin instructions. Unnamed laxative: Take 1 tablet by mouth at bedtime as needed for iron-induced constipation     omeprazole (PRILOSEC) 20 MG capsule Take 1 capsule (20 mg total) by mouth 2 (two) times daily before a meal. 60 capsule 2   OXYGEN Inhale 2 L/min into the lungs. As needed and at night     potassium chloride SA (KLOR-CON M) 20 MEQ tablet Take 3 tablets (60 mEq total) by mouth daily. With an additional 20 meq once a week for metolazone 94 tablet 2   psyllium (METAMUCIL) 58.6 % packet  Take 1 packet by mouth daily as needed.     Riociguat (ADEMPAS) 2.5 MG TABS Take 2.5 mg by mouth 3 (three) times daily. 90  tablet 11   rOPINIRole (REQUIP) 2 MG tablet Take 2 mg by mouth 3 (three) times daily.     Selexipag (UPTRAVI) 1000 MCG TABS Take 2,000 mcg by mouth 2 (two) times daily. 360 tablet 3   torsemide (DEMADEX) 100 MG tablet TAKE ONE TABLET TWICE DAILY 180 tablet 3   traMADol (ULTRAM) 50 MG tablet Take 50 mg by mouth every 6 (six) hours. As needed     No current facility-administered medications for this encounter.   BP 108/66   Pulse 88   Wt 82.7 kg (182 lb 6.4 oz)   SpO2 94%   BMI 31.31 kg/m   Wt Readings from Last 3 Encounters:  11/23/21 82.7 kg (182 lb 6.4 oz)  11/05/21 82.5 kg (181 lb 12.8 oz)  11/05/21 82.8 kg (182 lb 9.6 oz)   Physical Exam General:  NAD. No resp difficulty, walked into clinic with RW, elderly HEENT: Normal Neck: Supple. No JVD. Carotids 2+ bilat; no bruits. No lymphadenopathy or thryomegaly appreciated. Cor: PMI nondisplaced. Regular rate & rhythm. No rubs, gallops or murmurs. Lungs: Clear, diminished in bases. Abdomen: Soft, nontender, nondistended. No hepatosplenomegaly. No bruits or masses. Good bowel sounds. Extremities: No cyanosis, clubbing, rash, 2+ pedal edema Neuro: Alert & oriented x 3, cranial nerves grossly intact. Moves all 4 extremities w/o difficulty. Affect pleasant.  Assessment/Plan: 1.Chronic diastolic CHF: Suspect with significant component of RV failure from pulmonary hypertension.  Echo in 6/20 with EF 60-65%, RV not well-visualized but PASP 59 mmHg.  Echo (3/21) with EF 60-65%, severe LVH, normal RV, PASP 28 mmHg.  PYP scan was not suggestive of TTR cardiac amyloidosis. Echo in 6/22 showed EF 65-70% with mild LVH, normal RV, IVC normal, PASP estimated 29 mmHg.  Stable NYHA class III symptoms. He does not look significantly volume overloaded (minimal JVD), ankle edema is chronic and likely due in part to a significant component of venous insufficiency at this point. LEE much-improved today. BP improved with addition of midodrine. - Continue  midodrine 5 mg tid to promote higher BP in setting of RV failure. - Unable to wear compression stockings or elevate legs due to RLS.  - Continue metolazone 2.5 mg /20 KCL once a week. BMET today. - Continue torsemide 100 mg bid + KCL 60 daily. - Continue Toprol XL 12.5 mg nightly. 2. CKD Stage 3b: BMET today. 3. Pulmonary hypertension: Patient thought to have Baird out of proportion to his lung disease (ILD, NSIP versus UIP). RHC in 2/20 showed mean PA pressure 41, PVR 5.1 WU, CI 3. Echo in 6/20 with RV reportedly ok => I reviewed and do not think that RV was well-visualized.  Patient did not tolerate Adcirca due to edema/?cognitive effects.  RHC was done again in 6/20, showed moderate pulmonary arterial hypertension with PVR 5.35 (comparable to prior).  Serological workup for PH was negative. Patient started ambrisentan 5 mg and developed significant edema/volume overload despite a high dose of torsemide. He is now on Uptravi + riociguat.  Most recent echo in 7/23 showed normal RV size/systolic function and PA systolic pressure estimate was normal.   - He will stay off ambrisentan (? trigger for volume overload).  - As above, he did not tolerate tadalafil.  - He is at 2000 mcg bid Uptravi.  With excellent echo in 7/23, will keep at this  dose.   - Continue riociguat at goal dose 2.5 mg tid.  4. Atrial fibrillation: Paroxysmal, noted on ILR. Regular on exam today. - Continue apixaban 2.5 mg bid (dosed for age, renal dysfunction).  5. OSA: Cannot tolerate CPAP.  6. CVA: In 3/21, may have been atrial fibrillation-related.  On apixaban now.  7. Carotid stenosis: Carotid duplex 04/23: 40-59% right ICA stenosis, no significant stenosis on the left - Continue statin. - Repeat study in 1 year.  8. Interstitial lung disease:  Has seen pulmonary, ILD seems mild and stable.   Follow up in 6 weeks with APP and 3-4 months with Dr. Aundra Dubin.  Maricela Bo [facility] FNP-BC 11/23/2021

## 2021-11-21 ENCOUNTER — Other Ambulatory Visit: Payer: Self-pay | Admitting: Adult Health

## 2021-11-23 ENCOUNTER — Ambulatory Visit (HOSPITAL_COMMUNITY)
Admission: RE | Admit: 2021-11-23 | Discharge: 2021-11-23 | Disposition: A | Discharge status: Home / Self Care | Payer: Medicare HMO | Source: Ambulatory Visit | Attending: Family Medicine | Admitting: Family Medicine

## 2021-11-23 ENCOUNTER — Encounter (HOSPITAL_COMMUNITY): Payer: Self-pay

## 2021-11-23 VITALS — BP 108/66 | HR 88 | Wt 182.4 lb

## 2021-11-23 DIAGNOSIS — Z7901 Long term (current) use of anticoagulants: Secondary | ICD-10-CM | POA: Insufficient documentation

## 2021-11-23 DIAGNOSIS — Z8572 Personal history of non-Hodgkin lymphomas: Secondary | ICD-10-CM | POA: Insufficient documentation

## 2021-11-23 DIAGNOSIS — I5032 Chronic diastolic (congestive) heart failure: Secondary | ICD-10-CM

## 2021-11-23 DIAGNOSIS — Z9981 Dependence on supplemental oxygen: Secondary | ICD-10-CM | POA: Insufficient documentation

## 2021-11-23 DIAGNOSIS — G4733 Obstructive sleep apnea (adult) (pediatric): Secondary | ICD-10-CM | POA: Diagnosis not present

## 2021-11-23 DIAGNOSIS — Z79899 Other long term (current) drug therapy: Secondary | ICD-10-CM | POA: Diagnosis not present

## 2021-11-23 DIAGNOSIS — I130 Hypertensive heart and chronic kidney disease with heart failure and stage 1 through stage 4 chronic kidney disease, or unspecified chronic kidney disease: Secondary | ICD-10-CM | POA: Diagnosis present

## 2021-11-23 DIAGNOSIS — Z8673 Personal history of transient ischemic attack (TIA), and cerebral infarction without residual deficits: Secondary | ICD-10-CM | POA: Insufficient documentation

## 2021-11-23 DIAGNOSIS — I272 Pulmonary hypertension, unspecified: Secondary | ICD-10-CM | POA: Diagnosis not present

## 2021-11-23 DIAGNOSIS — I6521 Occlusion and stenosis of right carotid artery: Secondary | ICD-10-CM | POA: Insufficient documentation

## 2021-11-23 DIAGNOSIS — J8410 Pulmonary fibrosis, unspecified: Secondary | ICD-10-CM | POA: Diagnosis not present

## 2021-11-23 DIAGNOSIS — I2721 Secondary pulmonary arterial hypertension: Secondary | ICD-10-CM | POA: Insufficient documentation

## 2021-11-23 DIAGNOSIS — I6529 Occlusion and stenosis of unspecified carotid artery: Secondary | ICD-10-CM

## 2021-11-23 DIAGNOSIS — G2581 Restless legs syndrome: Secondary | ICD-10-CM | POA: Diagnosis not present

## 2021-11-23 DIAGNOSIS — J849 Interstitial pulmonary disease, unspecified: Secondary | ICD-10-CM

## 2021-11-23 DIAGNOSIS — N1832 Chronic kidney disease, stage 3b: Secondary | ICD-10-CM | POA: Insufficient documentation

## 2021-11-23 DIAGNOSIS — I480 Paroxysmal atrial fibrillation: Secondary | ICD-10-CM | POA: Insufficient documentation

## 2021-11-23 DIAGNOSIS — N1831 Chronic kidney disease, stage 3a: Secondary | ICD-10-CM | POA: Diagnosis not present

## 2021-11-23 LAB — BASIC METABOLIC PANEL
Anion gap: 12 (ref 5–15)
BUN: 100 mg/dL — ABNORMAL HIGH (ref 8–23)
CO2: 26 mmol/L (ref 22–32)
Calcium: 7.9 mg/dL — ABNORMAL LOW (ref 8.9–10.3)
Chloride: 100 mmol/L (ref 98–111)
Creatinine, Ser: 1.72 mg/dL — ABNORMAL HIGH (ref 0.61–1.24)
GFR, Estimated: 38 mL/min — ABNORMAL LOW (ref >60)
Glucose, Bld: 84 mg/dL (ref 70–99)
Potassium: 3.6 mmol/L (ref 3.5–5.1)
Sodium: 138 mmol/L (ref 135–145)

## 2021-11-23 NOTE — Patient Instructions (Addendum)
Thank you for coming in today  Labs were done today, if any labs are abnormal the clinic will call you No news is good news  Ok to resume Metolazone 2.5 mg and Potassium 20 meq once a week every Monday.  Your physician recommends that you schedule a follow-up appointment in:  6 weeks in clinic  3 months with Dr. Aundra Dubin    Do the following things EVERYDAY: Weigh yourself in the morning before breakfast. Write it down and keep it in a log. Take your medicines as prescribed Eat low salt foods--Limit salt (sodium) to 2000 mg per day.  Stay as active as you can everyday Limit all fluids for the day to less than 2 liters  At the [facility], you and your health needs are our priority. As part of our continuing mission to provide you with exceptional heart care, we have created designated Provider Care Teams. These Care Teams include your primary Cardiologist (physician) and Advanced Practice Providers (APPs- Physician Assistants and Nurse Practitioners) who all work together to provide you with the care you need, when you need it.   You may see any of the following providers on your designated Care Team at your next follow up: Dr Glori Bickers Dr Loralie Champagne Dr. Roxana Hires, NP Lyda Jester, [place] [facility] [place], [place] Forestine Na, NP Audry Riles, PharmD   Please be sure to bring in all your medications bottles to every appointment.   If you have any questions or concerns before your next appointment please send [place] a message through [place] or call our office at [phone].    TO LEAVE A MESSAGE FOR THE NURSE SELECT OPTION 2, PLEASE LEAVE A MESSAGE INCLUDING: YOUR NAME DATE OF BIRTH CALL BACK NUMBER REASON FOR CALL**this is important as we prioritize the call backs  YOU WILL RECEIVE A CALL BACK THE SAME DAY AS LONG AS YOU CALL BEFORE 4:00 PM

## 2021-11-24 NOTE — Telephone Encounter (Signed)
no

## 2021-12-07 ENCOUNTER — Ambulatory Visit (INDEPENDENT_AMBULATORY_CARE_PROVIDER_SITE_OTHER): Payer: Medicare HMO

## 2021-12-07 DIAGNOSIS — I6310 Cerebral infarction due to embolism of unspecified precerebral artery: Secondary | ICD-10-CM | POA: Diagnosis not present

## 2021-12-08 ENCOUNTER — Encounter (HOSPITAL_COMMUNITY): Payer: Medicare HMO

## 2021-12-08 LAB — CUP PACEART REMOTE DEVICE CHECK
Date Time Interrogation Session: 20231105232410
Implantable Pulse Generator Implant Date: 20210325

## 2021-12-11 ENCOUNTER — Other Ambulatory Visit: Payer: Self-pay | Admitting: Adult Health

## 2021-12-31 ENCOUNTER — Other Ambulatory Visit (HOSPITAL_COMMUNITY): Payer: Self-pay | Admitting: *Deleted

## 2021-12-31 MED ORDER — UPTRAVI 1000 MCG PO TABS: 2.0000 | ORAL_TABLET | Freq: Two times a day (BID) | ORAL | 3 refills | Status: DC

## 2022-01-01 NOTE — Progress Notes (Signed)
PCP: Roetta Sessions, NP Cardiology: Dr. Aundra Dubin  88 y.o. with history of interstitial lung disease (?NSIP vs UIP) on home oxygen, pulmonary hypertension, diastolic CHF, and prior non-Hodgkins lymphoma (pelvic mass, s/p XRT) presents for followup of CHF and pulmonary hypertension.  Patient has history of reportedly mild ILD (NSIP vs UIP) followed by a pulmonologist at [facility] and pulmonary hypertension thought to be out of proportion lung disease followed by a physician at [facility].  Last CT chest was in 11/19, showing stable ILD (?UIP vs NSIP).  He had RHC in 2/20 with moderate PAH, PVR 5.1 WU.  He was tried on Adcirca but developed cognitive problems and worsening of peripheral edema so this was stopped.  He has been on home oxygen, uses at night and with exertion.    He was admitted in 6/20 with worsening dyspnea and peripheral edema, weight up despite compliance with torsemide 40 mg bid at home.  He has CKD stage 3 and torsemide was decreased in the spring. He was diuresed aggressively with fall in weight.  Echo showed normal LV size and systolic function, RV reportedly normal.  6/20 RHC showed normal filling pressures with moderate pulmonary hypertension, PVR 5.35 WU.   After discharge from hospital, patient started on ambrisentan.  His weight steadily began to increase and he developed worsening lower extremity edema, we stopped ambrisentan.   Patient was admitted in 8/20 with AKI after taking metolazone for several days.  He was admitted again in 8/20 with acute on chronic diastolic CHF. He was diuresed.  Wandering atrial pacemaker noted.   He was admitted in 3/21 with suspected CVA, MRI head showed punctate occipital lobe infarct, thought to be due to small vessel disease. ILR was implanted.  Echo in 3/21 showed EF 60-65%, severe LVH, normal RV.  PYP scan in 3/21 was not suggestive of TTR amyloidosis.  Subsequently, ILR has shown paroxysmal atrial fibrillation.   He was admitted later in  3/21 with AKI, creatinine up to 5.  However, he corrected rapidly to baseline with IVF and creatinine was 1.02 when discharge.   Echo in 6/22 showed EF 65-70% with mild LVH, normal RV, IVC normal, PASP estimated 29 mmHg.   Patient was admitted in 9/22 with upper GI bleed from bleeding gastric polyps.  He had polypectomy and has been restarted on Eliquis.   He was seen in the office 10/23/20 and BMET showed creatinine up to 4.18.  He was also noted to be Fe deficient.  Torsemide, metolazone, and spironolactone were stopped. Follow up he felt better. Planned to resume torsemide in a couple days, continued to hold spiro and metolazone. Feraheme infusions arranged.  He was seen in the ER with atrial fibrillation in 12/22.    Echo in 7/23 showed EF 60-65%, RV normal, PASP estimation was not elevated, IVC normal.   Seen 10/23 for an acute visit when BP 70's and HR 44 at Neurology visit. Kidney function elevated and metolazone decreased back to once a week. Midodrine added.  Today he returns for HF follow up, with his wife. Overall feeling fine. Main complaint continues to be RLS. Leg swelling has improved some. Able to walk short distances with a rolling walker at his facility without dyspnea. Denies palpitations, abnormal bleeding, CP, dizziness, or PND/Orthopnea. Appetite ok. No fever or chills. Weight facility stable. Taking all medications provided by facility.   ECG (personally reviewed): none ordered today.  6 minute walk (8/21): 122 m 6 minute walk (11/21): 244 m 6  minute walk (3/22): 122 m  Labs (6/20): K 4.7, creatinine 1.46 Labs (7/20): K 3.1, creatinine 1.67 Labs (8/20): K 3.9, creatinine 1.8 Labs (9/20): K 4.9, creatinine 1.76, BNP 78 Labs (10/20): K 4.2, creatininine 1.5 Labs (11/20): BNP 36, K 4.3, creatinine 1.4 Labs (3/21): myeloma panel negative, urine immunofixation negative.  Labs (4/21): K 4.5, creatinine 1.02, hgb 11.4 Labs (6/21): K 5.1, creatinine 2.2, hgb 10.4 Labs  (8/21): LDL 82 Labs (11/21): K 5.3, creatinine 2.11 Labs (3/22): K 3.9, creatinine 2.31 Labs (4/22): K 4.1, creatinine 2.15 Labs (9/22): hgb 8.6 => 9.1, creatinine 1.5 => 4.18, BUN 85, BNP 38.5 Labs (11/22): K 4.0, creatinine 1.7 Labs (12/22): BNP 140, K 3.7, creatinine 1.87, hgb 12.1 Labs (1/23): K 4.0, creatinine 1.44 Labs (5/23): K 3.7, creatinine 1.6 Labs (7/23): TSH normal, K 3.1, creatinine 1.69, hgb 11.2 Labs (10/23): K 3.5, creatinine 1.76  PMH: 1. Non-Hodgkin's lymphoma: Pelvic involvement.  S/p radiation.  Thought to be in remission (Dr. Benay Spice).  2. HTN 3. PACs/PVCs 4. GERD: Barrett's esophagus.  5. 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  - High resolution CT chest 11/19: ILD, ?UIP pattern.  6. Pulmonary hypertension: Thought to be out of proportion to underlying lung disease (ILD, ?NSIP vs UIP).   - RHC (2/20): mean RA 10, PASP 60 with mean 42, mean PCWP 11, CI 3, PVR 5.1 WU.  - Trial of Adcirca => failed due to ?memory worsening/cognitive affects and peripheral edema.  - Echo (6/20): EF 60-65%, mild LVH, RV reportedly normal, PASP 59 mmHg.  - RHC (6/20): mean RA 7, PA 50/24 mean 34, mean PCWP 12, CI 2.01, PAPI 3.7, PVR 5.35 WU - RF negative, ANA negative, anti-SCL70 negative, anti-centromere negative.  - V/Q scan (8/20): No evidence for chronic PE.  - Echo (3/21): EF 60-65%, severe LVH, normal RV.  - Echo (6/22): EF 65-70% with mild LVH, normal RV, IVC normal, PASP estimated 29 mmHg. - Echo (7/23): EF 60-65%, RV normal, PASP estimation was not elevated, IVC normal.  7. CKD: Stage 3.   8. Wandering atrial pacemaker 9. OSA 10. PYP scan (3/21): H/CL ratio 1.0, grade 0.  11. CVA: MRI 3/21 with punctate occipital lobe infarct thought to be due to small vessel disease. - Carotid dopplers (3/21): 40-59% RICA stenosis.  - Carotid dopplers (4/22): 40-59% RICA stenosis.  - Carotid dopplers (4/23): 40-59% RICA stenosis.  12. Atrial fibrillation: Paroxysmal, noted on ILR.  13. GI  bleeding 9/22 from gastric polyps.  14. 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, [employer]  Tobacco Use   Smoking status: Former    Packs/day: 1.00    Years: 35.00    Total pack years: 35.00    Types: Pipe, Cigarettes    Quit date: 02/23/1992    Years since quitting: 29.8   Smokeless tobacco: Never   Tobacco comments:    quit 20 years ago  Vaping Use   Vaping Use: Never used  Substance and Sexual Activity   Alcohol use: Not Currently    Comment: 2 drinks daily scotch  AND WINE WITH SUPPER   Drug use: No   Sexual activity: Not on file  Other Topics Concern   Not on  file  Social History Narrative   Married - second marriage   He has two children   Retired Horticulturist, commercial Professor   Currently teaches pottery making, 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[facility]   Former Smoker quit 12 years ago- smoked for 35 years   Alcohol use- not currently   Right handed   Social Determinants of Radio broadcast assistant Strain: Not on file  Food Insecurity: Not on file  Transportation Needs: Not on file  Physical Activity: Not on file  Stress: Not on file  Social Connections: Not on file  Intimate Partner Violence: Not on file   Family History  Problem Relation Age of Onset   Dementia Mother    Heart disease Father        MI 44   Urticaria Father    Restless legs syndrome Father    Dementia Sister    Parkinson's disease Sister    ALS Sister    Prostate cancer Brother    Prostate cancer Paternal Uncle    Prostate cancer Paternal Uncle    Prostate cancer Paternal Uncle    Hyperlipidemia Other    Stroke Other    Hypertension Other    ADD / ADHD Other    Colon cancer Neg Hx    Allergic rhinitis Neg Hx    Asthma Neg Hx    Eczema Neg  Hx    ROS: All systems reviewed and negative except as per HPI.   Current Outpatient Medications  Medication Sig Dispense Refill   acetaminophen (TYLENOL) 325 MG tablet Take 325 mg by mouth in the morning, at noon, and at bedtime.     atorvastatin (LIPITOR) 40 MG tablet Take 1 tablet (40 mg total) by mouth daily at 6 PM. 30 tablet 1   cyanocobalamin (,VITAMIN B-12,) 1000 MCG/ML injection Inject 1,000 mcg into the muscle every 30 (thirty) days.     ELIQUIS 2.5 MG TABS tablet TAKE ONE TABLET TWICE DAILY 60 tablet 11   escitalopram (LEXAPRO) 10 MG tablet Take 10 mg by mouth daily.     finasteride (PROSCAR) 5 MG tablet Take 5 mg by mouth daily.     FLONASE SENSIMIST 27.5 MCG/SPRAY nasal spray Place 1 spray into the nose daily as needed for rhinitis or allergies.      gabapentin (NEURONTIN) 100 MG capsule TAKE ONE CAPSULE AT BEDTIME 30 capsule 1   Iron, Ferrous Sulfate, 325 (65 Fe) MG TABS Take 325 mg by mouth daily. 30 tablet 4   metolazone (ZAROXOLYN) 2.5 MG tablet Take 1 tablet (2.5 mg total) by mouth once a week. 12 tablet 3   metoprolol succinate (TOPROL-XL) 25 MG 24 hr tablet TAKE ONE-HALF TABLET AT BEDTIME 15 tablet 3   midodrine (PROAMATINE) 5 MG tablet Take 1 tablet (5 mg total) by mouth 3 (three) times daily with meals. 90 tablet 11   NON FORMULARY Take 1 tablet by mouth See admin instructions. Unnamed laxative: Take 1 tablet by mouth at bedtime as needed for iron-induced constipation     omeprazole (PRILOSEC) 20 MG capsule Take 1 capsule (20 mg total) by mouth 2 (two) times daily before a meal. 60 capsule 2   OXYGEN Inhale 2 L/min into the lungs. As needed and at night     potassium chloride SA (KLOR-CON M) 20 MEQ tablet Take 3 tablets (60 mEq total) by mouth daily. With an additional 20 meq once a week for metolazone 94 tablet 2   psyllium (METAMUCIL) 58.6 % packet  Take 1 packet by mouth daily as needed.     Riociguat (ADEMPAS) 2.5 MG TABS Take 2.5 mg by mouth 3 (three) times daily. 90  tablet 11   rOPINIRole (REQUIP) 2 MG tablet TAKE ONE TABLET THREE TIMES DAILY 90 tablet 4   Selexipag (UPTRAVI) 1000 MCG TABS Take 2 tablets by mouth 2 (two) times daily. 360 tablet 3   torsemide (DEMADEX) 100 MG tablet TAKE ONE TABLET TWICE DAILY 180 tablet 3   traMADol (ULTRAM) 50 MG tablet Take 50 mg by mouth every 6 (six) hours. As needed     No current facility-administered medications for this visit.   There were no vitals taken for this visit.  Wt Readings from Last 3 Encounters:  11/23/21 82.7 kg (182 lb 6.4 oz)  11/05/21 82.5 kg (181 lb 12.8 oz)  11/05/21 82.8 kg (182 lb 9.6 oz)   Physical Exam General:  NAD. No resp difficulty, walked into clinic with RW, elderly HEENT: Normal Neck: Supple. No JVD. Carotids 2+ bilat; no bruits. No lymphadenopathy or thryomegaly appreciated. Cor: PMI nondisplaced. Regular rate & rhythm. No rubs, gallops or murmurs. Lungs: Clear, diminished in bases. Abdomen: Soft, nontender, nondistended. No hepatosplenomegaly. No bruits or masses. Good bowel sounds. Extremities: No cyanosis, clubbing, rash, 2+ pedal edema Neuro: Alert & oriented x 3, cranial nerves grossly intact. Moves all 4 extremities w/o difficulty. Affect pleasant.  Assessment/Plan: 1.Chronic diastolic CHF: Suspect with significant component of RV failure from pulmonary hypertension.  Echo in 6/20 with EF 60-65%, RV not well-visualized but PASP 59 mmHg.  Echo (3/21) with EF 60-65%, severe LVH, normal RV, PASP 28 mmHg.  PYP scan was not suggestive of TTR cardiac amyloidosis. Echo in 6/22 showed EF 65-70% with mild LVH, normal RV, IVC normal, PASP estimated 29 mmHg.  Stable NYHA class III symptoms. He does not look significantly volume overloaded (minimal JVD), ankle edema is chronic and likely due in part to a significant component of venous insufficiency at this point. LEE much-improved today. BP improved with addition of midodrine. - Continue midodrine 5 mg tid to promote higher BP in  setting of RV failure. - Unable to wear compression stockings or elevate legs due to RLS.  - Continue metolazone 2.5 mg /20 KCL once a week. BMET today. - Continue torsemide 100 mg bid + KCL 60 daily. - Continue Toprol XL 12.5 mg nightly. 2. CKD Stage 3b: BMET today. 3. Pulmonary hypertension: Patient thought to have [place] out of proportion to his lung disease (ILD, NSIP versus UIP). RHC in 2/20 showed mean PA pressure 41, PVR 5.1 WU, CI 3. Echo in 6/20 with RV reportedly ok => I reviewed and do not think that RV was well-visualized.  Patient did not tolerate Adcirca due to edema/?cognitive effects.  RHC was done again in 6/20, showed moderate pulmonary arterial hypertension with PVR 5.35 (comparable to prior).  Serological workup for PH was negative. Patient started ambrisentan 5 mg and developed significant edema/volume overload despite a high dose of torsemide. He is now on Uptravi + riociguat.  Most recent echo in 7/23 showed normal RV size/systolic function and PA systolic pressure estimate was normal.   - He will stay off ambrisentan (? trigger for volume overload).  - As above, he did not tolerate tadalafil.  - He is at 2000 mcg bid Uptravi.  With excellent echo in 7/23, will keep at this dose.   - Continue riociguat at goal dose 2.5 mg tid.  4. Atrial fibrillation: Paroxysmal, noted on  ILR. Regular on exam today. - Continue apixaban 2.5 mg bid (dosed for age, renal dysfunction).  5. OSA: Cannot tolerate CPAP.  6. CVA: In 3/21, may have been atrial fibrillation-related.  On apixaban now.  7. Carotid stenosis: Carotid duplex 04/23: 40-59% right ICA stenosis, no significant stenosis on the left - Continue statin. - Repeat study in 1 year.  8. Interstitial lung disease:  Has seen pulmonary, ILD seems mild and stable.   Follow up in 6 weeks with APP and 3-4 months with Dr. Aundra Dubin.  Maricela [facility] FNP-BC 01/01/2022

## 2022-01-04 ENCOUNTER — Ambulatory Visit (HOSPITAL_COMMUNITY)
Admission: RE | Admit: 2022-01-04 | Discharge: 2022-01-04 | Disposition: A | Discharge status: Home / Self Care | Payer: Medicare HMO | Source: Ambulatory Visit | Attending: Family Medicine | Admitting: Family Medicine

## 2022-01-04 ENCOUNTER — Encounter (HOSPITAL_COMMUNITY): Payer: Self-pay

## 2022-01-04 VITALS — BP 110/80 | HR 94

## 2022-01-04 DIAGNOSIS — I272 Pulmonary hypertension, unspecified: Secondary | ICD-10-CM | POA: Diagnosis not present

## 2022-01-04 DIAGNOSIS — I2721 Secondary pulmonary arterial hypertension: Secondary | ICD-10-CM | POA: Insufficient documentation

## 2022-01-04 DIAGNOSIS — Z8673 Personal history of transient ischemic attack (TIA), and cerebral infarction without residual deficits: Secondary | ICD-10-CM | POA: Diagnosis not present

## 2022-01-04 DIAGNOSIS — I130 Hypertensive heart and chronic kidney disease with heart failure and stage 1 through stage 4 chronic kidney disease, or unspecified chronic kidney disease: Secondary | ICD-10-CM | POA: Diagnosis not present

## 2022-01-04 DIAGNOSIS — I6521 Occlusion and stenosis of right carotid artery: Secondary | ICD-10-CM | POA: Insufficient documentation

## 2022-01-04 DIAGNOSIS — G4733 Obstructive sleep apnea (adult) (pediatric): Secondary | ICD-10-CM

## 2022-01-04 DIAGNOSIS — I6529 Occlusion and stenosis of unspecified carotid artery: Secondary | ICD-10-CM

## 2022-01-04 DIAGNOSIS — J849 Interstitial pulmonary disease, unspecified: Secondary | ICD-10-CM

## 2022-01-04 DIAGNOSIS — I5032 Chronic diastolic (congestive) heart failure: Secondary | ICD-10-CM | POA: Diagnosis present

## 2022-01-04 DIAGNOSIS — Z87891 Personal history of nicotine dependence: Secondary | ICD-10-CM | POA: Insufficient documentation

## 2022-01-04 DIAGNOSIS — N1832 Chronic kidney disease, stage 3b: Secondary | ICD-10-CM | POA: Insufficient documentation

## 2022-01-04 DIAGNOSIS — Z8572 Personal history of non-Hodgkin lymphomas: Secondary | ICD-10-CM | POA: Diagnosis not present

## 2022-01-04 DIAGNOSIS — Z79899 Other long term (current) drug therapy: Secondary | ICD-10-CM | POA: Diagnosis not present

## 2022-01-04 DIAGNOSIS — I480 Paroxysmal atrial fibrillation: Secondary | ICD-10-CM | POA: Insufficient documentation

## 2022-01-04 DIAGNOSIS — N1831 Chronic kidney disease, stage 3a: Secondary | ICD-10-CM | POA: Diagnosis not present

## 2022-01-04 DIAGNOSIS — Z7901 Long term (current) use of anticoagulants: Secondary | ICD-10-CM | POA: Insufficient documentation

## 2022-01-04 DIAGNOSIS — J8410 Pulmonary fibrosis, unspecified: Secondary | ICD-10-CM | POA: Insufficient documentation

## 2022-01-04 DIAGNOSIS — G2581 Restless legs syndrome: Secondary | ICD-10-CM | POA: Diagnosis not present

## 2022-01-04 LAB — BASIC METABOLIC PANEL
Anion gap: 9 (ref 5–15)
BUN: 76 mg/dL — ABNORMAL HIGH (ref 8–23)
CO2: 26 mmol/L (ref 22–32)
Calcium: 7 mg/dL — ABNORMAL LOW (ref 8.9–10.3)
Chloride: 104 mmol/L (ref 98–111)
Creatinine, Ser: 1.59 mg/dL — ABNORMAL HIGH (ref 0.61–1.24)
GFR, Estimated: 41 mL/min — ABNORMAL LOW (ref >60)
Glucose, Bld: 82 mg/dL (ref 70–99)
Potassium: 3.8 mmol/L (ref 3.5–5.1)
Sodium: 139 mmol/L (ref 135–145)

## 2022-01-04 NOTE — Patient Instructions (Addendum)
Thank you for coming in today  Labs were done today, if any labs are abnormal the clinic will call you No news is good news  Your physician recommends that you schedule a follow-up appointment in:  2-3 month with Dr. Aundra Dubin    Do the following things EVERYDAY: Weigh yourself in the morning before breakfast. Write it down and keep it in a log. Take your medicines as prescribed Eat low salt foods--Limit salt (sodium) to 2000 mg per day.  Stay as active as you can everyday Limit all fluids for the day to less than 2 liters  At the [facility], you and your health needs are our priority. As part of our continuing mission to provide you with exceptional heart care, we have created designated Provider Care Teams. These Care Teams include your primary Cardiologist (physician) and Advanced Practice Providers (APPs- Physician Assistants and Nurse Practitioners) who all work together to provide you with the care you need, when you need it.   You may see any of the following providers on your designated Care Team at your next follow up: Dr Glori Bickers Dr Loralie Champagne Dr. Roxana Hires, NP Lyda Jester, [place] [facility] [place], [place] Forestine Na, NP Audry Riles, PharmD   Please be sure to bring in all your medications bottles to every appointment.   If you have any questions or concerns before your next appointment please send [place] a message through [place] or call our office at [phone].    TO LEAVE A MESSAGE FOR THE NURSE SELECT OPTION 2, PLEASE LEAVE A MESSAGE INCLUDING: YOUR NAME DATE OF BIRTH CALL BACK NUMBER REASON FOR CALL**this is important as we prioritize the call backs  YOU WILL RECEIVE A CALL BACK THE SAME DAY AS LONG AS YOU CALL BEFORE 4:00 PM

## 2022-01-07 NOTE — Progress Notes (Signed)
Carelink Summary Report / Loop Recorder

## 2022-01-11 ENCOUNTER — Ambulatory Visit (INDEPENDENT_AMBULATORY_CARE_PROVIDER_SITE_OTHER): Payer: Medicare HMO

## 2022-01-11 DIAGNOSIS — I639 Cerebral infarction, unspecified: Secondary | ICD-10-CM

## 2022-01-11 LAB — CUP PACEART REMOTE DEVICE CHECK
Date Time Interrogation Session: 20231210231315
Implantable Pulse Generator Implant Date: 20210325

## 2022-01-13 ENCOUNTER — Telehealth (HOSPITAL_COMMUNITY): Payer: Self-pay | Admitting: Pharmacist

## 2022-01-13 ENCOUNTER — Other Ambulatory Visit: Payer: Self-pay | Admitting: Adult Health

## 2022-01-13 NOTE — Telephone Encounter (Signed)
Patient Advocate Encounter   Received notification from [employer] that prior authorization for Ryan [name] is required.   PA submitted on CoverMyMeds Key [id number] Status is pending   Will continue to follow.   Ryan [name], PharmD, BCPS, BCCP, CPP Heart Failure Clinic Pharmacist [phone]

## 2022-01-14 NOTE — Telephone Encounter (Signed)
Advanced Heart Failure Patient Advocate Encounter  Prior Authorization for Ryan [name] has been approved.    Effective dates:  02/01/2022 - 02/01/2023  Audry Riles, PharmD, BCPS, BCCP, CPP Heart Failure Clinic Pharmacist [phone]

## 2022-01-26 ENCOUNTER — Other Ambulatory Visit: Payer: Self-pay | Admitting: Adult Health

## 2022-02-02 ENCOUNTER — Telehealth (HOSPITAL_COMMUNITY): Payer: Self-pay

## 2022-02-02 ENCOUNTER — Inpatient Hospital Stay (HOSPITAL_COMMUNITY)
Admission: EM | Admit: 2022-02-02 | Discharge: 2022-02-08 | DRG: 286 | Disposition: A | Discharge status: Home / Self Care | Payer: Medicare HMO | Attending: Internal Medicine | Admitting: Internal Medicine

## 2022-02-02 ENCOUNTER — Encounter (HOSPITAL_COMMUNITY): Payer: Self-pay | Admitting: Pharmacy Technician

## 2022-02-02 ENCOUNTER — Emergency Department (HOSPITAL_COMMUNITY): Payer: Medicare HMO

## 2022-02-02 DIAGNOSIS — Z888 Allergy status to other drugs, medicaments and biological substances status: Secondary | ICD-10-CM

## 2022-02-02 DIAGNOSIS — T502X5A Adverse effect of carbonic-anhydrase inhibitors, benzothiadiazides and other diuretics, initial encounter: Secondary | ICD-10-CM | POA: Diagnosis present

## 2022-02-02 DIAGNOSIS — Z1152 Encounter for screening for COVID-19: Secondary | ICD-10-CM

## 2022-02-02 DIAGNOSIS — K449 Diaphragmatic hernia without obstruction or gangrene: Secondary | ICD-10-CM | POA: Diagnosis present

## 2022-02-02 DIAGNOSIS — R338 Other retention of urine: Secondary | ICD-10-CM | POA: Diagnosis present

## 2022-02-02 DIAGNOSIS — K219 Gastro-esophageal reflux disease without esophagitis: Secondary | ICD-10-CM | POA: Diagnosis present

## 2022-02-02 DIAGNOSIS — G4700 Insomnia, unspecified: Secondary | ICD-10-CM | POA: Diagnosis present

## 2022-02-02 DIAGNOSIS — M7989 Other specified soft tissue disorders: Secondary | ICD-10-CM | POA: Diagnosis present

## 2022-02-02 DIAGNOSIS — Z66 Do not resuscitate: Secondary | ICD-10-CM | POA: Diagnosis not present

## 2022-02-02 DIAGNOSIS — Z6830 Body mass index (BMI) 30.0-30.9, adult: Secondary | ICD-10-CM

## 2022-02-02 DIAGNOSIS — I5082 Biventricular heart failure: Secondary | ICD-10-CM | POA: Diagnosis present

## 2022-02-02 DIAGNOSIS — I5033 Acute on chronic diastolic (congestive) heart failure: Principal | ICD-10-CM | POA: Diagnosis present

## 2022-02-02 DIAGNOSIS — Z9981 Dependence on supplemental oxygen: Secondary | ICD-10-CM

## 2022-02-02 DIAGNOSIS — Z79899 Other long term (current) drug therapy: Secondary | ICD-10-CM

## 2022-02-02 DIAGNOSIS — K9041 Non-celiac gluten sensitivity: Secondary | ICD-10-CM | POA: Diagnosis present

## 2022-02-02 DIAGNOSIS — J849 Interstitial pulmonary disease, unspecified: Secondary | ICD-10-CM | POA: Diagnosis present

## 2022-02-02 DIAGNOSIS — I130 Hypertensive heart and chronic kidney disease with heart failure and stage 1 through stage 4 chronic kidney disease, or unspecified chronic kidney disease: Principal | ICD-10-CM | POA: Diagnosis present

## 2022-02-02 DIAGNOSIS — I9589 Other hypotension: Secondary | ICD-10-CM | POA: Diagnosis present

## 2022-02-02 DIAGNOSIS — Z87891 Personal history of nicotine dependence: Secondary | ICD-10-CM

## 2022-02-02 DIAGNOSIS — J441 Chronic obstructive pulmonary disease with (acute) exacerbation: Secondary | ICD-10-CM | POA: Diagnosis present

## 2022-02-02 DIAGNOSIS — N179 Acute kidney failure, unspecified: Secondary | ICD-10-CM | POA: Diagnosis present

## 2022-02-02 DIAGNOSIS — N1832 Chronic kidney disease, stage 3b: Secondary | ICD-10-CM | POA: Diagnosis present

## 2022-02-02 DIAGNOSIS — Z85820 Personal history of malignant melanoma of skin: Secondary | ICD-10-CM

## 2022-02-02 DIAGNOSIS — E1122 Type 2 diabetes mellitus with diabetic chronic kidney disease: Secondary | ICD-10-CM | POA: Diagnosis present

## 2022-02-02 DIAGNOSIS — H547 Unspecified visual loss: Secondary | ICD-10-CM | POA: Diagnosis present

## 2022-02-02 DIAGNOSIS — G2581 Restless legs syndrome: Secondary | ICD-10-CM | POA: Diagnosis present

## 2022-02-02 DIAGNOSIS — E785 Hyperlipidemia, unspecified: Secondary | ICD-10-CM | POA: Diagnosis present

## 2022-02-02 DIAGNOSIS — L03119 Cellulitis of unspecified part of limb: Secondary | ICD-10-CM | POA: Diagnosis present

## 2022-02-02 DIAGNOSIS — I2721 Secondary pulmonary arterial hypertension: Secondary | ICD-10-CM | POA: Diagnosis present

## 2022-02-02 DIAGNOSIS — I480 Paroxysmal atrial fibrillation: Secondary | ICD-10-CM | POA: Diagnosis present

## 2022-02-02 DIAGNOSIS — R112 Nausea with vomiting, unspecified: Secondary | ICD-10-CM | POA: Diagnosis not present

## 2022-02-02 DIAGNOSIS — R5382 Chronic fatigue, unspecified: Secondary | ICD-10-CM | POA: Diagnosis present

## 2022-02-02 DIAGNOSIS — J4489 Other specified chronic obstructive pulmonary disease: Secondary | ICD-10-CM | POA: Diagnosis present

## 2022-02-02 DIAGNOSIS — D509 Iron deficiency anemia, unspecified: Secondary | ICD-10-CM | POA: Diagnosis present

## 2022-02-02 DIAGNOSIS — Z8249 Family history of ischemic heart disease and other diseases of the circulatory system: Secondary | ICD-10-CM

## 2022-02-02 DIAGNOSIS — Z7901 Long term (current) use of anticoagulants: Secondary | ICD-10-CM

## 2022-02-02 DIAGNOSIS — M1990 Unspecified osteoarthritis, unspecified site: Secondary | ICD-10-CM | POA: Diagnosis present

## 2022-02-02 DIAGNOSIS — G4733 Obstructive sleep apnea (adult) (pediatric): Secondary | ICD-10-CM | POA: Diagnosis present

## 2022-02-02 DIAGNOSIS — D631 Anemia in chronic kidney disease: Secondary | ICD-10-CM | POA: Diagnosis present

## 2022-02-02 DIAGNOSIS — J9621 Acute and chronic respiratory failure with hypoxia: Secondary | ICD-10-CM | POA: Diagnosis present

## 2022-02-02 DIAGNOSIS — N390 Urinary tract infection, site not specified: Secondary | ICD-10-CM | POA: Diagnosis present

## 2022-02-02 DIAGNOSIS — E669 Obesity, unspecified: Secondary | ICD-10-CM | POA: Diagnosis present

## 2022-02-02 DIAGNOSIS — R197 Diarrhea, unspecified: Secondary | ICD-10-CM | POA: Diagnosis not present

## 2022-02-02 DIAGNOSIS — E876 Hypokalemia: Secondary | ICD-10-CM | POA: Diagnosis present

## 2022-02-02 DIAGNOSIS — Z923 Personal history of irradiation: Secondary | ICD-10-CM

## 2022-02-02 DIAGNOSIS — Z8572 Personal history of non-Hodgkin lymphomas: Secondary | ICD-10-CM

## 2022-02-02 DIAGNOSIS — Z8719 Personal history of other diseases of the digestive system: Secondary | ICD-10-CM

## 2022-02-02 DIAGNOSIS — Z8673 Personal history of transient ischemic attack (TIA), and cerebral infarction without residual deficits: Secondary | ICD-10-CM

## 2022-02-02 LAB — CBC
HCT: 30 % — ABNORMAL LOW (ref 39.0–52.0)
Hemoglobin: 9 g/dL — ABNORMAL LOW (ref 13.0–17.0)
MCH: 25.9 pg — ABNORMAL LOW (ref 26.0–34.0)
MCHC: 30 g/dL (ref 30.0–36.0)
MCV: 86.2 fL (ref 80.0–100.0)
Platelets: 350 10*3/uL (ref 150–400)
RBC: 3.48 MIL/uL — ABNORMAL LOW (ref 4.22–5.81)
RDW: 16.8 % — ABNORMAL HIGH (ref 11.5–15.5)
WBC: 7.6 10*3/uL (ref 4.0–10.5)
nRBC: 0 % (ref 0.0–0.2)

## 2022-02-02 LAB — URINALYSIS, ROUTINE W REFLEX MICROSCOPIC
Bilirubin Urine: NEGATIVE
Glucose, UA: NEGATIVE mg/dL
Hgb urine dipstick: NEGATIVE
Ketones, ur: NEGATIVE mg/dL
Nitrite: NEGATIVE
Protein, ur: NEGATIVE mg/dL
Specific Gravity, Urine: 1.009 (ref 1.005–1.030)
WBC, UA: >50 WBC/hpf — ABNORMAL HIGH (ref 0–5)
pH: 5 (ref 5.0–8.0)

## 2022-02-02 LAB — BASIC METABOLIC PANEL
Anion gap: 14 (ref 5–15)
BUN: 111 mg/dL — ABNORMAL HIGH (ref 8–23)
CO2: 26 mmol/L (ref 22–32)
Calcium: 6.8 mg/dL — ABNORMAL LOW (ref 8.9–10.3)
Chloride: 94 mmol/L — ABNORMAL LOW (ref 98–111)
Creatinine, Ser: 2 mg/dL — ABNORMAL HIGH (ref 0.61–1.24)
GFR, Estimated: 32 mL/min — ABNORMAL LOW (ref >60)
Glucose, Bld: 93 mg/dL (ref 70–99)
Potassium: 2.9 mmol/L — ABNORMAL LOW (ref 3.5–5.1)
Sodium: 134 mmol/L — ABNORMAL LOW (ref 135–145)

## 2022-02-02 LAB — TROPONIN I (HIGH SENSITIVITY)
Troponin I (High Sensitivity): 14 ng/L (ref <18)
Troponin I (High Sensitivity): 15 ng/L (ref <18)

## 2022-02-02 LAB — BRAIN NATRIURETIC PEPTIDE: B Natriuretic Peptide: 187.7 pg/mL — ABNORMAL HIGH (ref 0.0–100.0)

## 2022-02-02 NOTE — ED Provider Triage Note (Signed)
Emergency Medicine Provider Triage Evaluation Note  Ryan [name] , a 89 y.o. male  was evaluated in triage.  Pt complains of fatigue, generalized weakness, difficulty breathing.  States he has felt generally weak with worsening leg swelling.  Found to be hypoxic in the 80s at his living facility.  Contrary to triage note does not wear oxygen chronically.  Denies chest pain.  Reports 18 pound weight gain in the past 1 month.  Review of Systems  Positive: Fatigue, shortness of breath, leg swelling Negative: Chest pain  Physical Exam  There were no vitals taken for this visit. Gen:   Awake, no distress   Dyspneic with conversation Resp:  Normal effort  MSK:   Moves extremities without difficulty  Other:  Bibasilar crackles  Medical Decision Making  Medically screening exam initiated at 12:23 PM.  Appropriate orders placed.  Ryan [name] was informed that the remainder of the evaluation will be completed by another provider, this initial triage assessment does not replace that evaluation, and the importance of remaining in the ED until their evaluation is complete.  Concern for CHF exacerbation   [name], [name], MD 02/02/22 1224

## 2022-02-02 NOTE — Telephone Encounter (Signed)
Patient called and stated he has gained 18 pounds, is very short of breath, is back on oxygen and when he is off oxygen his sats are only 80. According to his wife, when he is on oxygen his sats are 28. She states his legs are terribly swollen, worse she has seen. He took a Psychologist, sport and exercise. She is going to call [facility] this morning and see about communication with him.   I have informed Triage to be on the lookout for another call,  but wanted to go ahead and update you on the status of him. As soon as I can get more information from his wife, I will forward to you. I spoke with Ria Comment also and she recommended getting more information in case he needs to head to ED.

## 2022-02-02 NOTE — ED Triage Notes (Addendum)
Pt bib ems from home with increased shob along with weight gain and increased bil edema L>R. Pt uses oxygen 3L PRN. Lungs CTA bilaterally. Pt also states recently treated for UTI and still having burning with urination. Dry weight 174lb, up to 188lb. 88% on RA, up to 97% 3L [place].  12 lean unremarkable 122/74 HR 84 with PVC's RR 16 CBG 119

## 2022-02-02 NOTE — Telephone Encounter (Signed)
I spoke with Ryan [name] and he is experiencing significant shortness of breath and swelling along with weight gain. I spoke to the nurse at [facility] and they will arrange transportation for him to go directly to the ED today. They prefer this to [facility] since they are just assisted living. I will also notify his wife of status.

## 2022-02-03 ENCOUNTER — Inpatient Hospital Stay (HOSPITAL_COMMUNITY): Payer: Medicare HMO

## 2022-02-03 ENCOUNTER — Other Ambulatory Visit: Payer: Self-pay

## 2022-02-03 DIAGNOSIS — I5031 Acute diastolic (congestive) heart failure: Secondary | ICD-10-CM | POA: Diagnosis not present

## 2022-02-03 DIAGNOSIS — N189 Chronic kidney disease, unspecified: Secondary | ICD-10-CM

## 2022-02-03 DIAGNOSIS — M1990 Unspecified osteoarthritis, unspecified site: Secondary | ICD-10-CM | POA: Diagnosis present

## 2022-02-03 DIAGNOSIS — N390 Urinary tract infection, site not specified: Secondary | ICD-10-CM | POA: Diagnosis present

## 2022-02-03 DIAGNOSIS — E669 Obesity, unspecified: Secondary | ICD-10-CM | POA: Diagnosis present

## 2022-02-03 DIAGNOSIS — K9041 Non-celiac gluten sensitivity: Secondary | ICD-10-CM | POA: Diagnosis present

## 2022-02-03 DIAGNOSIS — E876 Hypokalemia: Secondary | ICD-10-CM | POA: Diagnosis not present

## 2022-02-03 DIAGNOSIS — I50810 Right heart failure, unspecified: Secondary | ICD-10-CM

## 2022-02-03 DIAGNOSIS — Z7901 Long term (current) use of anticoagulants: Secondary | ICD-10-CM

## 2022-02-03 DIAGNOSIS — E7800 Pure hypercholesterolemia, unspecified: Secondary | ICD-10-CM | POA: Diagnosis not present

## 2022-02-03 DIAGNOSIS — E785 Hyperlipidemia, unspecified: Secondary | ICD-10-CM | POA: Diagnosis present

## 2022-02-03 DIAGNOSIS — D509 Iron deficiency anemia, unspecified: Secondary | ICD-10-CM

## 2022-02-03 DIAGNOSIS — J9621 Acute and chronic respiratory failure with hypoxia: Secondary | ICD-10-CM | POA: Diagnosis not present

## 2022-02-03 DIAGNOSIS — L03119 Cellulitis of unspecified part of limb: Secondary | ICD-10-CM | POA: Diagnosis present

## 2022-02-03 DIAGNOSIS — J849 Interstitial pulmonary disease, unspecified: Secondary | ICD-10-CM | POA: Diagnosis present

## 2022-02-03 DIAGNOSIS — J441 Chronic obstructive pulmonary disease with (acute) exacerbation: Secondary | ICD-10-CM

## 2022-02-03 DIAGNOSIS — I9589 Other hypotension: Secondary | ICD-10-CM | POA: Diagnosis present

## 2022-02-03 DIAGNOSIS — I272 Pulmonary hypertension, unspecified: Secondary | ICD-10-CM | POA: Diagnosis not present

## 2022-02-03 DIAGNOSIS — I480 Paroxysmal atrial fibrillation: Secondary | ICD-10-CM | POA: Diagnosis present

## 2022-02-03 DIAGNOSIS — N179 Acute kidney failure, unspecified: Secondary | ICD-10-CM | POA: Diagnosis not present

## 2022-02-03 DIAGNOSIS — Z66 Do not resuscitate: Secondary | ICD-10-CM | POA: Diagnosis not present

## 2022-02-03 DIAGNOSIS — I5082 Biventricular heart failure: Secondary | ICD-10-CM | POA: Diagnosis present

## 2022-02-03 DIAGNOSIS — I5033 Acute on chronic diastolic (congestive) heart failure: Secondary | ICD-10-CM | POA: Diagnosis present

## 2022-02-03 DIAGNOSIS — G2581 Restless legs syndrome: Secondary | ICD-10-CM | POA: Diagnosis present

## 2022-02-03 DIAGNOSIS — J4489 Other specified chronic obstructive pulmonary disease: Secondary | ICD-10-CM | POA: Diagnosis present

## 2022-02-03 DIAGNOSIS — Z1152 Encounter for screening for COVID-19: Secondary | ICD-10-CM | POA: Diagnosis not present

## 2022-02-03 DIAGNOSIS — I2721 Secondary pulmonary arterial hypertension: Secondary | ICD-10-CM | POA: Diagnosis present

## 2022-02-03 DIAGNOSIS — I130 Hypertensive heart and chronic kidney disease with heart failure and stage 1 through stage 4 chronic kidney disease, or unspecified chronic kidney disease: Secondary | ICD-10-CM | POA: Diagnosis present

## 2022-02-03 DIAGNOSIS — N1832 Chronic kidney disease, stage 3b: Secondary | ICD-10-CM | POA: Diagnosis present

## 2022-02-03 DIAGNOSIS — D631 Anemia in chronic kidney disease: Secondary | ICD-10-CM | POA: Diagnosis present

## 2022-02-03 DIAGNOSIS — R338 Other retention of urine: Secondary | ICD-10-CM | POA: Diagnosis present

## 2022-02-03 DIAGNOSIS — N3000 Acute cystitis without hematuria: Secondary | ICD-10-CM

## 2022-02-03 DIAGNOSIS — Z8572 Personal history of non-Hodgkin lymphomas: Secondary | ICD-10-CM

## 2022-02-03 DIAGNOSIS — E1122 Type 2 diabetes mellitus with diabetic chronic kidney disease: Secondary | ICD-10-CM | POA: Diagnosis present

## 2022-02-03 LAB — ECHOCARDIOGRAM COMPLETE
AR max vel: 2.65 cm2
AV Area VTI: 2.5 cm2
AV Area mean vel: 2.57 cm2
AV Mean grad: 7 mmHg
AV Peak grad: 12.1 mmHg
Ao pk vel: 1.74 m/s
Area-P 1/2: 3.65 cm2
Height: 65 in
Weight: 2992 oz

## 2022-02-03 LAB — RESP PANEL BY RT-PCR (RSV, FLU A&B, COVID)  RVPGX2
Influenza A by PCR: NEGATIVE
Influenza B by PCR: NEGATIVE
Resp Syncytial Virus by PCR: NEGATIVE
SARS Coronavirus 2 by RT PCR: NEGATIVE

## 2022-02-03 LAB — COMPREHENSIVE METABOLIC PANEL
ALT: 9 U/L (ref 0–44)
AST: 10 U/L — ABNORMAL LOW (ref 15–41)
Albumin: 3 g/dL — ABNORMAL LOW (ref 3.5–5.0)
Alkaline Phosphatase: 103 U/L (ref 38–126)
Anion gap: 16 — ABNORMAL HIGH (ref 5–15)
BUN: 98 mg/dL — ABNORMAL HIGH (ref 8–23)
CO2: 30 mmol/L (ref 22–32)
Calcium: 6.6 mg/dL — ABNORMAL LOW (ref 8.9–10.3)
Chloride: 94 mmol/L — ABNORMAL LOW (ref 98–111)
Creatinine, Ser: 1.82 mg/dL — ABNORMAL HIGH (ref 0.61–1.24)
GFR, Estimated: 35 mL/min — ABNORMAL LOW (ref >60)
Glucose, Bld: 84 mg/dL (ref 70–99)
Potassium: 2.9 mmol/L — ABNORMAL LOW (ref 3.5–5.1)
Sodium: 140 mmol/L (ref 135–145)
Total Bilirubin: 0.6 mg/dL (ref 0.3–1.2)
Total Protein: 6.4 g/dL — ABNORMAL LOW (ref 6.5–8.1)

## 2022-02-03 LAB — CBC
HCT: 29.2 % — ABNORMAL LOW (ref 39.0–52.0)
Hemoglobin: 9 g/dL — ABNORMAL LOW (ref 13.0–17.0)
MCH: 26 pg (ref 26.0–34.0)
MCHC: 30.8 g/dL (ref 30.0–36.0)
MCV: 84.4 fL (ref 80.0–100.0)
Platelets: 347 10*3/uL (ref 150–400)
RBC: 3.46 MIL/uL — ABNORMAL LOW (ref 4.22–5.81)
RDW: 16.7 % — ABNORMAL HIGH (ref 11.5–15.5)
WBC: 7.3 10*3/uL (ref 4.0–10.5)
nRBC: 0 % (ref 0.0–0.2)

## 2022-02-03 LAB — MAGNESIUM: Magnesium: 2 mg/dL (ref 1.7–2.4)

## 2022-02-03 MED ORDER — SELEXIPAG 1600 MCG PO TABS
1600.0000 ug | ORAL_TABLET | Freq: Two times a day (BID) | ORAL | Status: DC
  Administered 2022-02-03 – 2022-02-08 (×10): 1600 ug via ORAL
  Filled 2022-02-03 (×11): qty 1

## 2022-02-03 MED ORDER — ESCITALOPRAM OXALATE 10 MG PO TABS
10.0000 mg | ORAL_TABLET | Freq: Every day | ORAL | Status: DC
  Administered 2022-02-04 – 2022-02-08 (×5): 10 mg via ORAL
  Filled 2022-02-03 (×5): qty 1

## 2022-02-03 MED ORDER — MIDODRINE HCL 5 MG PO TABS
10.0000 mg | ORAL_TABLET | Freq: Three times a day (TID) | ORAL | Status: DC
  Administered 2022-02-03 – 2022-02-08 (×14): 10 mg via ORAL
  Filled 2022-02-03 (×15): qty 2

## 2022-02-03 MED ORDER — ACETAMINOPHEN 650 MG RE SUPP: 650.0000 mg | Freq: Four times a day (QID) | RECTAL | Status: DC | PRN

## 2022-02-03 MED ORDER — SELEXIPAG 400 MCG PO TABS
400.0000 ug | ORAL_TABLET | Freq: Two times a day (BID) | ORAL | Status: DC
  Administered 2022-02-03 – 2022-02-08 (×10): 400 ug via ORAL
  Filled 2022-02-03 (×11): qty 1

## 2022-02-03 MED ORDER — ATORVASTATIN CALCIUM 40 MG PO TABS
40.0000 mg | ORAL_TABLET | Freq: Every day | ORAL | Status: DC
  Administered 2022-02-03 – 2022-02-07 (×4): 40 mg via ORAL
  Filled 2022-02-03 (×5): qty 1

## 2022-02-03 MED ORDER — MIDODRINE HCL 5 MG PO TABS
5.0000 mg | ORAL_TABLET | Freq: Three times a day (TID) | ORAL | Status: DC
  Administered 2022-02-03: 5 mg via ORAL
  Filled 2022-02-03: qty 1

## 2022-02-03 MED ORDER — ACETAMINOPHEN 325 MG PO TABS: 650.0000 mg | ORAL_TABLET | Freq: Four times a day (QID) | ORAL | Status: DC | PRN

## 2022-02-03 MED ORDER — POTASSIUM CHLORIDE CRYS ER 20 MEQ PO TBCR
40.0000 meq | EXTENDED_RELEASE_TABLET | ORAL | Status: AC
  Administered 2022-02-03: 40 meq via ORAL
  Filled 2022-02-03: qty 2

## 2022-02-03 MED ORDER — TRAMADOL HCL 50 MG PO TABS
50.0000 mg | ORAL_TABLET | Freq: Four times a day (QID) | ORAL | Status: DC | PRN
  Administered 2022-02-03 – 2022-02-08 (×3): 50 mg via ORAL
  Filled 2022-02-03 (×3): qty 1

## 2022-02-03 MED ORDER — FLUTICASONE PROPIONATE 50 MCG/ACT NA SUSP: 1.0000 | Freq: Every day | NASAL | Status: DC | PRN

## 2022-02-03 MED ORDER — FUROSEMIDE 10 MG/ML IJ SOLN
80.0000 mg | Freq: Once | INTRAMUSCULAR | Status: AC
  Administered 2022-02-03: 80 mg via INTRAVENOUS
  Filled 2022-02-03: qty 8

## 2022-02-03 MED ORDER — RIOCIGUAT 2.5 MG PO TABS
2.5000 mg | ORAL_TABLET | Freq: Three times a day (TID) | ORAL | Status: DC
  Administered 2022-02-03 – 2022-02-08 (×13): 2.5 mg via ORAL
  Filled 2022-02-03 (×14): qty 1

## 2022-02-03 MED ORDER — FINASTERIDE 5 MG PO TABS
5.0000 mg | ORAL_TABLET | Freq: Every day | ORAL | Status: DC
  Administered 2022-02-04 – 2022-02-08 (×5): 5 mg via ORAL
  Filled 2022-02-03 (×5): qty 1

## 2022-02-03 MED ORDER — FLUTICASONE FUROATE 27.5 MCG/SPRAY NA SUSP: 1.0000 | Freq: Every day | NASAL | Status: DC | PRN

## 2022-02-03 MED ORDER — POTASSIUM CHLORIDE CRYS ER 20 MEQ PO TBCR
40.0000 meq | EXTENDED_RELEASE_TABLET | Freq: Once | ORAL | Status: AC
  Administered 2022-02-03: 40 meq via ORAL
  Filled 2022-02-03: qty 2

## 2022-02-03 MED ORDER — SODIUM CHLORIDE 0.9 % IV SOLN
1.0000 g | INTRAVENOUS | Status: DC
  Administered 2022-02-03 – 2022-02-06 (×4): 1 g via INTRAVENOUS
  Filled 2022-02-03 (×4): qty 10

## 2022-02-03 MED ORDER — FERROUS SULFATE 325 (65 FE) MG PO TABS
325.0000 mg | ORAL_TABLET | Freq: Every day | ORAL | Status: DC
  Administered 2022-02-04 – 2022-02-08 (×5): 325 mg via ORAL
  Filled 2022-02-03 (×5): qty 1

## 2022-02-03 MED ORDER — SELEXIPAG 1000 MCG PO TABS: 2.0000 | ORAL_TABLET | Freq: Two times a day (BID) | ORAL | Status: DC

## 2022-02-03 MED ORDER — SODIUM CHLORIDE 0.9% FLUSH
3.0000 mL | Freq: Two times a day (BID) | INTRAVENOUS | Status: DC
  Administered 2022-02-03 – 2022-02-08 (×5): 3 mL via INTRAVENOUS

## 2022-02-03 MED ORDER — POTASSIUM CHLORIDE CRYS ER 20 MEQ PO TBCR
60.0000 meq | EXTENDED_RELEASE_TABLET | Freq: Every day | ORAL | Status: DC
  Administered 2022-02-04 – 2022-02-08 (×5): 60 meq via ORAL
  Filled 2022-02-03 (×5): qty 3

## 2022-02-03 MED ORDER — GABAPENTIN 100 MG PO CAPS
100.0000 mg | ORAL_CAPSULE | Freq: Every day | ORAL | Status: DC
  Administered 2022-02-03 – 2022-02-07 (×5): 100 mg via ORAL
  Filled 2022-02-03 (×5): qty 1

## 2022-02-03 MED ORDER — APIXABAN 2.5 MG PO TABS
2.5000 mg | ORAL_TABLET | Freq: Two times a day (BID) | ORAL | Status: DC
  Administered 2022-02-03 – 2022-02-04 (×2): 2.5 mg via ORAL
  Filled 2022-02-03 (×2): qty 1

## 2022-02-03 MED ORDER — FUROSEMIDE 10 MG/ML IJ SOLN
80.0000 mg | Freq: Two times a day (BID) | INTRAMUSCULAR | Status: DC
  Administered 2022-02-03 – 2022-02-04 (×3): 80 mg via INTRAVENOUS
  Filled 2022-02-03 (×3): qty 8

## 2022-02-03 MED ORDER — ROPINIROLE HCL 1 MG PO TABS
2.0000 mg | ORAL_TABLET | Freq: Three times a day (TID) | ORAL | Status: DC
  Administered 2022-02-03 – 2022-02-04 (×5): 2 mg via ORAL
  Filled 2022-02-03 (×6): qty 2

## 2022-02-03 NOTE — Progress Notes (Signed)
Echocardiogram 2D Echocardiogram has been performed.  Ryan [name] 02/03/2022, 1:11 PM

## 2022-02-03 NOTE — Progress Notes (Signed)
Heart Failure Navigator Progress Note  Assessed for Heart & Vascular TOC clinic readiness.  Patient does not meet criteria due to Advanced Heart Failure patient of Dr. Aundra Dubin.   Navigator will sign off at this time.   Earnestine Leys, BSN, Clinical cytogeneticist Only

## 2022-02-03 NOTE — ED Provider Notes (Signed)
[facility] EMERGENCY DEPARTMENT Provider Note   CSN: [id number] Arrival date & time: 02/02/22  1156     History  Chief Complaint  Patient presents with   Shortness of Breath    DEMONE LYLES is a 89 y.o. male.  Patient presents to the emergency department for evaluation of shortness of breath and leg swelling.  Patient reports that he has gained 18 pounds over the last 3 weeks.  He has been taking his torsemide as prescribed, 100 mg twice a day.  Additionally he normally takes metolazone 2.5 mg once a week.  He did increase it to twice a week without improvement.  In the last 2 days he has become quite short of breath and has had to use his supplemental oxygen which she normally does not use.  No associated chest pain.  Patient also complains of dysuria.  He reports that he has been having urinary frequency and dysuria.  He has been on Cipro for several days but it has not helped.       Home Medications Prior to Admission medications   Medication Sig Start Date End Date Taking? Authorizing Provider  acetaminophen (TYLENOL) 325 MG tablet Take 325 mg by mouth in the morning, at noon, and at bedtime.    [provider]  atorvastatin (LIPITOR) 40 MG tablet Take 1 tablet (40 mg total) by mouth daily at 6 PM. 04/27/19   Hosie Poisson, MD  cyanocobalamin (,VITAMIN B-12,) 1000 MCG/ML injection Inject 1,000 mcg into the muscle every 30 (thirty) days.    [provider]  ELIQUIS 2.5 MG TABS tablet TAKE ONE TABLET TWICE DAILY 07/08/21   Larey Dresser, MD  escitalopram (LEXAPRO) 10 MG tablet Take 10 mg by mouth daily.    [provider]  finasteride (PROSCAR) 5 MG tablet Take 5 mg by mouth daily.    [provider]  FLONASE SENSIMIST 27.5 MCG/SPRAY nasal spray Place 1 spray into the nose daily as needed for rhinitis or allergies.  12/11/18   [provider]  gabapentin (NEURONTIN) 100 MG capsule TAKE ONE CAPSULE AT BEDTIME  01/13/22   Ward Givens, NP  Iron, Ferrous Sulfate, 325 (65 Fe) MG TABS Take 325 mg by mouth daily. 12/31/20   [place], Maricela Bo, FNP  metolazone (ZAROXOLYN) 2.5 MG tablet Take 1 tablet (2.5 mg total) by mouth once a week. 11/05/21   Larey Dresser, MD  metoprolol succinate (TOPROL-XL) 25 MG 24 hr tablet TAKE ONE-HALF TABLET AT BEDTIME 11/18/21   Larey Dresser, MD  midodrine (PROAMATINE) 5 MG tablet Take 1 tablet (5 mg total) by mouth 3 (three) times daily with meals. 11/05/21   Larey Dresser, MD  NON FORMULARY Take 1 tablet by mouth See admin instructions. Unnamed laxative: Take 1 tablet by mouth at bedtime as needed for iron-induced constipation    [provider]  omeprazole (PRILOSEC) 20 MG capsule Take 1 capsule (20 mg total) by mouth 2 (two) times daily before a meal. 09/16/21   Sharyn Creamer, MD  OXYGEN Inhale 2 L/min into the lungs. As needed and at night    [provider]  potassium chloride SA (KLOR-CON M) 20 MEQ tablet Take 3 tablets (60 mEq total) by mouth daily. With an additional 20 meq once a week for metolazone 11/05/21   Larey Dresser, MD  psyllium (METAMUCIL) 58.6 % packet Take 1 packet by mouth daily as needed.    [provider]  Riociguat (  ADEMPAS) 2.5 MG TABS Take 2.5 mg by mouth 3 (three) times daily. 08/20/21   Larey Dresser, MD  rOPINIRole (REQUIP) 2 MG tablet TAKE ONE TABLET THREE TIMES DAILY 12/16/21   Ward Givens, NP  Selexipag (UPTRAVI) 1000 MCG TABS Take 2 tablets by mouth 2 (two) times daily. 12/31/21   Larey Dresser, MD  torsemide [facility]) 100 MG tablet TAKE ONE TABLET TWICE DAILY 09/24/21   [place], Maricela Bo, FNP  traMADol (ULTRAM) 50 MG tablet Take 50 mg by mouth every 6 (six) hours. As needed    [provider]      Allergies    Pollen extract-tree extract, Feraheme [ferumoxytol], and Molds & smuts    Review of Systems   Review of Systems  Physical Exam Updated Vital Signs BP 100/61 (BP Location:  Right Arm)   Pulse 93   Temp 98 F (36.7 C)   Resp 18   SpO2 100%  Physical Exam Vitals and nursing note reviewed.  Constitutional:      General: He is not in acute distress.    Appearance: He is well-developed.  HENT:     Head: Normocephalic and atraumatic.     Mouth/Throat:     Mouth: Mucous membranes are moist.  Eyes:     General: Vision grossly intact. Gaze aligned appropriately.     Extraocular Movements: Extraocular movements intact.     Conjunctiva/sclera: Conjunctivae normal.  Cardiovascular:     Rate and Rhythm: Normal rate and regular rhythm.     Pulses: Normal pulses.     Heart sounds: Normal heart sounds, S1 normal and S2 normal. No murmur heard.    No friction rub. No gallop.  Pulmonary:     Effort: Pulmonary effort is normal. No respiratory distress.     Breath sounds: Decreased breath sounds present.  Abdominal:     Palpations: Abdomen is soft.     Tenderness: There is no abdominal tenderness. There is no guarding or rebound.     Hernia: No hernia is present.  Musculoskeletal:        General: No swelling.     Cervical back: Full passive range of motion without pain, normal range of motion and neck supple. No pain with movement, spinous process tenderness or muscular tenderness. Normal range of motion.     Right lower leg: Edema present.     Left lower leg: Edema present.  Skin:    General: Skin is warm and dry.     Capillary Refill: Capillary refill takes less than 2 seconds.     Findings: No ecchymosis, erythema, lesion or wound.  Neurological:     Mental Status: He is alert and oriented to person, place, and time.     GCS: GCS eye subscore is 4. GCS verbal subscore is 5. GCS motor subscore is 6.     Cranial Nerves: Cranial nerves 2-12 are intact.     Sensory: Sensation is intact.     Motor: Motor function is intact. No weakness or abnormal muscle tone.     Coordination: Coordination is intact.  Psychiatric:        Mood and Affect: Mood normal.         Speech: Speech normal.        Behavior: Behavior normal.     ED Results / Procedures / Treatments   Labs (all labs ordered are listed, but only abnormal results are displayed) Labs Reviewed  BASIC METABOLIC PANEL - Abnormal; Notable for the following components:  Result Value   Sodium 134 (*)    Potassium 2.9 (*)    Chloride 94 (*)    BUN 111 (*)    Creatinine, Ser 2.00 (*)    Calcium 6.8 (*)    GFR, Estimated 32 (*)    All other components within normal limits  CBC - Abnormal; Notable for the following components:   RBC 3.48 (*)    Hemoglobin 9.0 (*)    HCT 30.0 (*)    MCH 25.9 (*)    RDW 16.8 (*)    All other components within normal limits  URINALYSIS, ROUTINE W REFLEX MICROSCOPIC - Abnormal; Notable for the following components:   APPearance HAZY (*)    Leukocytes,Ua LARGE (*)    WBC, UA >50 (*)    Bacteria, UA MANY (*)    All other components within normal limits  BRAIN NATRIURETIC PEPTIDE - Abnormal; Notable for the following components:   B Natriuretic Peptide 187.7 (*)    All other components within normal limits  URINE CULTURE  TROPONIN I (HIGH SENSITIVITY)  TROPONIN I (HIGH SENSITIVITY)    EKG EKG Interpretation  Date/Time:  Tuesday February 02 2022 12:27:25 EST Ventricular Rate:  82 PR Interval:  176 QRS Duration: 118 QT Interval:  414 QTC Calculation: 483 R Axis:   82 Text Interpretation: Normal sinus rhythm Incomplete right bundle branch block Prolonged QT Abnormal ECG When compared with ECG of 05-Nov-2021 12:29, PREVIOUS ECG IS PRESENT no stemi,. similar to prior Confirmed by Wynona Dove ([id number]) on 02/02/2022 2:01:15 PM  Radiology DG Chest 2 View  Result Date: 02/02/2022 CLINICAL DATA:  Shortness of breath EXAM: CHEST - 2 VIEW COMPARISON:  CXR 01/21/22 FINDINGS: No pleural effusion. No pneumothorax. Persistent large hiatal hernia. Loop recorder device in place. There are prominent bilateral interstitial opacities, nonspecific, possibly related to  pulmonary venous congestion or atypical infection. Left basilar atelectasis. Visualized upper abdomen is notable for gas distended loops of bowel. Possible mild height loss of the L2 vertebral body, but this is incompletely assessed. There is a metallic density projecting over the T10 vertebral body level, this is nonspecific, and new compared to 2021. IMPRESSION: 1. Prominent bilateral interstitial opacities, nonspecific, possibly related to pulmonary venous congestion or atypical infection. 2. Possible mild height loss of the L2 vertebral body, but this is incompletely assessed. Consider dedicated imaging of the lumbar spine. 3. Metallic density projecting over the T10 vertebral body level is nonspecific and may be external. Correlate with physical exam. Electronically Signed   By: Marin Roberts M.D.   On: 02/02/2022 13:15    Procedures Procedures    Medications Ordered in ED Medications  furosemide (LASIX) injection 80 mg (has no administration in time range)  cefTRIAXone (ROCEPHIN) 1 g in sodium chloride 0.9 % 100 mL IVPB (has no administration in time range)    ED Course/ Medical Decision Making/ A&P                           Medical Decision Making Amount and/or Complexity of Data Reviewed External Data Reviewed: labs, radiology, ECG and notes. Labs: ordered. Decision-making details documented in ED Course. Radiology: ordered and independent interpretation performed. Decision-making details documented in ED Course. ECG/medicine tests: ordered and independent interpretation performed. Decision-making details documented in ED Course.   Patient presents to the emergency department for evaluation of shortness of breath, increased leg swelling.  I have reviewed his prior cardiology notes.  Patient with diastolic  heart failure with preserved ejection fraction.  He has been hospitalized several times in the past with similar presentations.  Patient reports an 18 pound weight gain over the  last 2 to 3 weeks.  He does have massive edema of bilateral lower extremities with increased work of breathing.  He is currently on 3 L by nasal cannula and his oxygen saturations are improved.  Patient reports that although he does have oxygen at home, he does not normally use it.  He has been using it for the last 2 days.  Current presentation concerning for significant volume overload and will require hospitalization for prolonged diuresis.  Initiated in the ED.  Analysis also suggestive of persistent infection despite being on Cipro.  Culture obtained.  Will administer Rocephin.        Final Clinical Impression(s) / ED Diagnoses Final diagnoses:  Acute on chronic diastolic congestive heart failure (HCC)  Urinary tract infection without hematuria, site unspecified    Rx / DC Orders ED Discharge Orders     None         Jozelynn Danielson, Gwenyth Allegra, MD 02/03/22 0502

## 2022-02-03 NOTE — Progress Notes (Signed)
  Carryover admission to the Day Admitter.  I discussed this case with the EDP, Dr.  Betsey Holiday.  Per these discussions:   This is a 89 year old male with history of chronic diastolic heart failure, who is being admitted with acute on chronic diastolic heart failure complicated by acute hypoxic respiratory distress after presenting with 2 to 3 days of progressive shortness of breath associate with orthopnea, worsening of peripheral edema, recent weight gain.  This occurred in spite of good compliance with his outpatient diuretic regimen which is reported to include torsemide 100 mg p.o. twice daily as well as q. weekly Zaroxolyn, noting that the patient took an extra dose of Zaroxolyn over the last few days, without any significant improvement in the above symptoms.  He uses oxygen only on a as needed basis at home, but was noted to have initial oxygen saturations in the high 80s, subsequently improving into the mid to high 90s on 3 L nasal cannula.  He also reports approximately 1 week of new onset dysuria, for which he was started on ciprofloxacin as an outpatient, but is experienced perpetuation of these acute urinary symptoms in the interval.  Urinalysis in the ED this evening demonstrated significant pyuria, many bacteria, no squamous epithelial cells.  He was started on Rocephin, which I have continued for now.  Urine culture collected.  Additional labs notable for high-sensitivity troponin I initially 14, with repeat noted to be 15.  Presenting potassium 2.9, status post receipt of 40 mill equivalents of oral potassium in the ED.  Additionally, he received Lasix 80 mg IV x 1 dose.  I have placed an order for inpatient admission to cardiac telemetry for further evaluation and management of the above.  I have placed some additional preliminary admit orders via the adult multi-morbid admission order set. I have also added on serum magnesium level. Entered code status of DNR based upon active MOST. Will  defer additional decision-making regarding dose of ensuing diuretic to admitting [place], DO Hospitalist

## 2022-02-03 NOTE — H&P (Addendum)
History and Physical    Patient: Ryan [name] DOB: 1931-09-06 DOA: 02/02/2022 DOS: the patient was seen and examined on 02/03/2022 PCP: Roetta Sessions, NP  Patient coming from: [name] via [facility]  Chief Complaint:  Chief Complaint  Patient presents with   Shortness of Breath   HPI: Ryan [name] is a 89 y.o. male with medical history significant of hypertension, hyperlipidemia, diastolic CHF, PAF on Eliquis, COPD, history of interstitial lung disease, diabetes mellitus type 2, and non-Hodgkin's lymphoma s/p radiation therapy  presents with complaints of shortness of breath and leg swelling over the last 3 weeks.  Reported associated symptoms of fatigue, orthopnea, and reports of weight gain of approximately 18 pounds.  Denies having any significant fever, cough, or chest pain, or blood in his stool.  Patient does report having poor eyesight for which she could not tell if he had blood in his stool.  Patient reported compliance with medication regimen which includes  torsemide 100 mg p.o. twice daily as well as q. weekly Zaroxolyn.  It was reported that the patient took an extra dose of Zaroxolyn over the last few days without any significant improvement.  Patient reports a remote history of needing oxygen at baseline, but had been off of it for quite some time until most recently over the last 2 to 3 days.  Also recently been treated for urinary tract infection with 3 days of Ciprofloxacin without any improvement in burning discomfort with urination.  Patient has a hard time seen  O2 saturations noted to be 88% on room air with from and up to 97% on 3 L.  In the emergency department patient was noted to be afebrile with respirations 16-29, blood pressures 92/59- 108/73, and O2 saturations currently maintained on 3 L of nasal cannula oxygen.  Labs significant for hemoglobin 9 (baseline 11) sodium 134, potassium 2.9, BUN 11, creatinine 2, calcium 6.8, BNP 187.7, and  high-sensitivity troponin negative x 2.  Chest x-ray noted prominent bilateral opacities which questioned pulmonary venous congestion or atypical infection, and mild possible height loss of L2 vertebral body.  Urinalysis significant for large leukocytes with many bacteria and greater than 50 WBCs.  Patient had been given 40 mEq of potassium chloride Lasix 80 mg IV, and Rocephin.    Review of Systems: As mentioned in the history of present illness. All other systems reviewed and are negative. Past Medical History:  Diagnosis Date   Asthma    Barrett's esophagus    Benign prostatic hypertrophy    Bilateral lower extremity edema    CHF (congestive heart failure) (HCC)    Chronic fatigue    COPD (chronic obstructive pulmonary disease) (HCC)    Diabetes mellitus type 2 in nonobese ([place]) 04/24/2019   Diverticulosis of colon (without mention of hemorrhage)    Esophageal reflux    Gastritis    Gluten intolerance    Hiatal hernia 02/10/2011   Noted on CT Scan - Moderate Hiatal Hernia   Hyperlipemia    Hypertension    Lymphoma of lymph nodes in pelvis ([place]) 03/03/2011    Large Right Retroperitoneal Mass   Melanoma ([place])    lymphoma   Microcytic anemia 04/20/2011   NHL (non-Hodgkin's 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 Collene Mares   Pulmonary hyperinflation  Renal cyst 02/10/2011    Noted on CT Scan - Bilateral Renal Cysts   S/P radiation therapy 03/15/11 - 04/09/11   Abdominal/ Pelvic Tumor, 3600 cGy/20 Fractions   Sleep apnea    Past Surgical History:  Procedure Laterality Date   ARTHROSCOPIC REPAIR ACL     right   BONE MARROW ASPIRATION  02/25/11   Bone Marrow, Aspirate, Clot, and Bilateral Bx, Right PIC   CATARACT EXTRACTION, BILATERAL     ESOPHAGOGASTRODUODENOSCOPY (EGD) WITH PROPOFOL N/A  10/19/2020   Procedure: ESOPHAGOGASTRODUODENOSCOPY (EGD) WITH PROPOFOL;  Surgeon: Sharyn Creamer, MD;  Location: [facility] ENDOSCOPY;  Service: Gastroenterology;  Laterality: N/A;   EYE SURGERY     HEMOSTASIS CLIP PLACEMENT  10/19/2020   Procedure: HEMOSTASIS CLIP PLACEMENT;  Surgeon: Sharyn Creamer, MD;  Location: [facility] ENDOSCOPY;  Service: Gastroenterology;;   HERNIA REPAIR     LEFT INGUINAL    INSERTION OF MESH N/A 08/23/2012   Procedure: INSERTION OF MESH;  Surgeon: Madilyn Hook, DO;  Location: [facility] ORS;  Service: General;  Laterality: N/A;   LOOP RECORDER INSERTION N/A 04/26/2019   Procedure: LOOP RECORDER INSERTION;  Surgeon: Evans Lance, MD;  Location: [place] CV LAB;  Service: Cardiovascular;  Laterality: N/A;   LUMBAR LAMINECTOMY/DECOMPRESSION MICRODISCECTOMY Right 12/31/2013   Procedure: RIGHT L4-5 L5-S1 LAMINECTOMY;  Surgeon: Kristeen Miss, MD;  Location: [place] NEURO ORS;  Service: Neurosurgery;  Laterality: Right;  RIGHT L4-5 L5-S1 LAMINECTOMY   POLYPECTOMY  10/19/2020   Procedure: POLYPECTOMY;  Surgeon: Sharyn Creamer, MD;  Location[facility] ENDOSCOPY;  Service: Gastroenterology;;   RIGHT HEART CATH N/A 07/25/2018   Procedure: RIGHT HEART CATH;  Surgeon: Larey Dresser, MD;  Location: [place] CV LAB;  Service: Cardiovascular;  Laterality: N/A;   ROTATOR CUFF REPAIR     right   TONSILLECTOMY     TONSILLECTOMY     VENTRAL HERNIA REPAIR N/A 08/23/2012   Procedure: HERNIA REPAIR VENTRAL ADULT;  Surgeon: Madilyn Hook, DO;  Location: [facility] ORS;  Service: General;  Laterality: N/A;   Social History:  reports that he quit smoking about 29 years ago. His smoking use included pipe and cigarettes. He has a 35.00 pack-year smoking history. He has never used smokeless tobacco. He reports that he does not currently use alcohol. He reports that he does not use drugs.  Allergies  Allergen Reactions   Pollen Extract-Tree Extract Other (See Comments)    "HEADACHES, TIRED, DRAINAGE FROM SINUSES"   Feraheme  [Ferumoxytol] Other (See Comments)    Chest pain, facial flushing, shortness of breath   Molds & Smuts Other (See Comments)    Also dust mites causes sinus infections, h/a etc.    Family History  Problem Relation Age of Onset   Dementia Mother    Heart disease Father        MI 34   Urticaria Father    Restless legs syndrome Father    Dementia Sister    Parkinson's disease Sister    ALS Sister    Prostate cancer Brother    Prostate cancer Paternal Uncle    Prostate cancer Paternal Uncle    Prostate cancer Paternal Uncle    Hyperlipidemia Other    Stroke Other    Hypertension Other    ADD / ADHD Other    Colon cancer Neg Hx    Allergic rhinitis Neg Hx    Asthma Neg Hx    Eczema Neg Hx     Prior to Admission medications  Medication Sig Start Date End Date Taking? Authorizing Provider  acetaminophen (TYLENOL) 325 MG tablet Take 325 mg by mouth in the morning, at noon, and at bedtime.    [provider]  atorvastatin (LIPITOR) 40 MG tablet Take 1 tablet (40 mg total) by mouth daily at 6 PM. 04/27/19   Hosie Poisson, MD  cyanocobalamin (,VITAMIN B-12,) 1000 MCG/ML injection Inject 1,000 mcg into the muscle every 30 (thirty) days.    [provider]  ELIQUIS 2.5 MG TABS tablet TAKE ONE TABLET TWICE DAILY 07/08/21   Larey Dresser, MD  escitalopram (LEXAPRO) 10 MG tablet Take 10 mg by mouth daily.    [provider]  finasteride (PROSCAR) 5 MG tablet Take 5 mg by mouth daily.    [provider]  FLONASE SENSIMIST 27.5 MCG/SPRAY nasal spray Place 1 spray into the nose daily as needed for rhinitis or allergies.  12/11/18   [provider]  gabapentin (NEURONTIN) 100 MG capsule TAKE ONE CAPSULE AT BEDTIME 01/13/22   Ward Givens, NP  Iron, Ferrous Sulfate, 325 (65 Fe) MG TABS Take 325 mg by mouth daily. 12/31/20   Milford, Maricela Bo, FNP  metolazone (ZAROXOLYN) 2.5 MG tablet Take 1 tablet (2.5 mg total) by mouth once a week. 11/05/21    Larey Dresser, MD  metoprolol succinate (TOPROL-XL) 25 MG 24 hr tablet TAKE ONE-HALF TABLET AT BEDTIME 11/18/21   Larey Dresser, MD  midodrine (PROAMATINE) 5 MG tablet Take 1 tablet (5 mg total) by mouth 3 (three) times daily with meals. 11/05/21   Larey Dresser, MD  NON FORMULARY Take 1 tablet by mouth See admin instructions. Unnamed laxative: Take 1 tablet by mouth at bedtime as needed for iron-induced constipation    [provider]  omeprazole (PRILOSEC) 20 MG capsule Take 1 capsule (20 mg total) by mouth 2 (two) times daily before a meal. 09/16/21   Sharyn Creamer, MD  OXYGEN Inhale 2 L/min into the lungs. As needed and at night    [provider]  potassium chloride SA (KLOR-CON M) 20 MEQ tablet Take 3 tablets (60 mEq total) by mouth daily. With an additional 20 meq once a week for metolazone 11/05/21   Larey Dresser, MD  psyllium (METAMUCIL) 58.6 % packet Take 1 packet by mouth daily as needed.    [provider]  Riociguat (ADEMPAS) 2.5 MG TABS Take 2.5 mg by mouth 3 (three) times daily. 08/20/21   Larey Dresser, MD  rOPINIRole (REQUIP) 2 MG tablet TAKE ONE TABLET THREE TIMES DAILY 12/16/21   Ward Givens, NP  Selexipag (UPTRAVI) 1000 MCG TABS Take 2 tablets by mouth 2 (two) times daily. 12/31/21   Larey Dresser, MD  torsemide [facility]) 100 MG tablet TAKE ONE TABLET TWICE DAILY 09/24/21   [place], Maricela Bo, FNP  traMADol (ULTRAM) 50 MG tablet Take 50 mg by mouth every 6 (six) hours. As needed    [provider]    Physical Exam: Vitals:   02/03/22 0515 02/03/22 0543 02/03/22 0615 02/03/22 0630  BP: (!) 103/54   100/64  Pulse: 90   94  Resp: (!) 29   18  Temp:   98.2 F (36.8 C)   TempSrc:   Oral   SpO2: 97%   96%  Weight:  84.8 kg    Height:  _0  (1.651 m)     Exam  Constitutional: Elderly male who appears to be in no acute distress Eyes: PERRL,  drainage noted of the bilateral eyes patient wearing glasses. ENMT: Mucous  membranes are moist.  Fair dentition. Neck: normal, supple, JVD present. Respiratory: Decreased overall aeration with crackles noted in the lower lung fields.  Patient currently on 3 L of nasal cannula oxygen with O2 saturations maintained. Cardiovascular: Regular rate and rhythm with at least 3+ pitting bilateral lower extremity edema.  2+ pedal pulses. No carotid bruits. Abdomen: Some tenderness palpation of the lower abdomen.  Sounds present all 4 quadrants. Musculoskeletal: no clubbing / cyanosis. No joint deformity upper and lower extremities. Good ROM, no contractures. Normal muscle tone.  Skin: Venous stasis changes with concern for increased warmth noted of the bilateral lower extremities Neurologic: CN 2-12 grossly intact.  Strength 5/5 in all 4.  Psychiatric: Normal judgment and insight. Alert and oriented x 3. Normal mood.   Data Reviewed:  EKG revealed normal sinus rhythm 82 bpm with incomplete RBBB, and QTc 483.  Reviewed labs, imaging records as noted above in HPI.  Assessment and Plan:   Acute on  chronic respiratory failure secondary to Heart failure with preserved ejection fraction Pulmonary artery hypertension Patient presented with complaints of orthopnea, worsening leg swelling, and reports of weight gain of 18 pounds.  O2 saturations noted to be around 88% on room air with improvement on 3 L of oxygen.  Patient reported that he had not required be on oxygen in quite some time.  Reports compliance with torsemide 100 mg daily and had also taken extra dose of metolazone without improvement.  BNP elevated at 187.7 which is higher than had been seen before.  Last EF noted to be 60 to 65% with normal diastolic parameters and aortic dilation noted measuring 41 mm.  Patient followed by Dr. Aundra Dubin in outpatient setting. -Admit to a telemetry bed -Heart failure order set utilized -Continuous pulse oximetry with nasal cannula oxygen maintain O2 saturation greater than 92% -Strict  I&O's and daily weights -Check echocardiogram -Lasix 80 mg IV twice daily -Continue Adempas and Uptravi if able to get them from nursing facility, but wife unable to come pick them up because she [place] for a baby -Cardiology consulted, we will follow-up for any further recommendations  Hypokalemia Acute.  Initial potassium 2.9.  Patient had been given potassium chloride 40 mEq p.o. -Check magnesium level -Will recheck potassium levels today(2.9) and replace as needed -Given additional potassium chloride 40 mEq -Resume home potassium regimen of 60 mill equivalents in a.m.  Acute kidney injury superimposed on chronic kidney disease stage IIIb Patient presents with creatinine initially elevated up to 2 with BUN 111. Creatinine previously 1.59 on 01/04/2022.  Repeat creatinine today noted to be 1.82 with BUN 98 which appears improved.  Therefore suspect symptoms likely secondary to hypoperfusion in the setting of CHF exacerbation. -Continue to monitor kidney function with diuresis  Urinary tract infection Prior to arrival.  Patient had recently been treated for urinary tract infection, but was still having burning with urination.  Urinalysis significant for large leukocytes with many bacteria and greater than 50 WBCs.  Patient had been started on empiric antibiotics of Rocephin IV. -Check urine culture -Continue Rocephin  Possible cellulitis of the lower extremities Acute.  Patient noted to have significant erythema with increased warmth of the bilateral lower extremities.  Question possibility of cellulitis given significant amount of swelling patient has in his lower extremities. -Antibiotics as noted above  Hypochromic anemia On admission hemoglobin was initially 9 with low MCH 25.9.  As available hemoglobin 11.4  on 11/05/2021.  Patient unsure of any bleeding as he is unable to see very well. -Continue iron supplements -Recheck hemoglobin (9).  Stable at this time.  Chronic  hypotension Blood pressures were noted to be soft 94/54.  Patient on midodrine at baseline. -Increase midodrine to 10 mg 3 times daily due to patient's soft blood pressures at this time. -Held metoprolol  COPD, without acute exacerbation ILD Patient without significant wheezes or rhonchi appreciated at this time.  Patient followed by Dr. Vaughan Browner of pulmonology in the outpatient setting. -Continue breathing treatments as needed  Paroxysmal atrial fibrillation on chronic anticoagulation Patient appears to be in sinus rhythm at this time. -Continue Eliquis  BPH with urinary retention -Continue Proscar  Hyperlipidemia  -Continue  atorvastatin  History of non-Hodgkin's lymphoma  status postradiation and currently in remission   Hiatal hernia  GERD -Continue Protonix  DVT prophylaxis: Continue Eliquis as hemoglobin appears stable Advance Care Planning:   Code Status: DNR    Consults: Cardiology  Family Communication: Wife updated over the phone.  She request to be called and updated daily as she is out of town  Severity of Illness: The appropriate patient status for this patient is INPATIENT. Inpatient status is judged to be reasonable and necessary in order to provide the required intensity of service to ensure the patient's safety. The patient's presenting symptoms, physical exam findings, and initial radiographic and laboratory data in the context of their chronic comorbidities is felt to place them at high risk for further clinical deterioration. Furthermore, it is not anticipated that the patient will be medically stable for discharge from the hospital within 2 midnights of admission.   * I certify that at the point of admission it is my clinical judgment that the patient will require inpatient hospital care spanning beyond 2 midnights from the point of admission due to high intensity of service, high risk for further deterioration and high frequency of surveillance  required.*  Author: Norval Morton, MD 02/03/2022 7:44 AM  For on call review [email or web address].

## 2022-02-03 NOTE — Consult Note (Addendum)
Advanced Heart Failure Team Consult Note   Primary Physician: Roetta Sessions, NP PCP-Cardiologist:  [name], MD AHF: Dr. Aundra Dubin   Reason for Consultation: acute on chronic right sided heart failure   HPI:    Ryan [name] is seen today for evaluation of acute on chronic right sided heart failure at the request of Dr. Tamala Julian, Internal Medicine.   89 y.o. with history of interstitial lung disease (?NSIP vs UIP) on home oxygen, pulmonary hypertension, diastolic CHF, and prior non-Hodgkins lymphoma (pelvic mass, s/p XRT) presents for followup of CHF and pulmonary hypertension.  Patient has history of reportedly mild ILD (NSIP vs UIP) followed by a pulmonologist at [facility] and pulmonary hypertension thought to be out of proportion lung disease followed by a physician at [facility].  Last CT chest was in 11/19, showing stable ILD (?UIP vs NSIP).  He had RHC in 2/20 with moderate PAH, PVR 5.1 WU.  He was tried on Adcirca but developed cognitive problems and worsening of peripheral edema so this was stopped.  He has been on home oxygen, uses at night and with exertion.   Echo 6/20 showed normal LV size and systolic function, RV reportedly normal.  6/20 RHC showed normal filling pressures with moderate pulmonary hypertension, PVR 5.35 WU. Patient started on ambrisentan.  His weight steadily began to increase and he developed worsening lower extremity edema, we stopped ambrisentan. He has been able to tolerate Uptravi.   He was admitted in 3/21 with suspected CVA, MRI head showed punctate occipital lobe infarct, thought to be due to small vessel disease. ILR was implanted.  Echo in 3/21 showed EF 60-65%, severe LVH, normal RV.  PYP scan in 3/21 was not suggestive of TTR amyloidosis.  Subsequently, ILR has shown paroxysmal atrial fibrillation.    Echo in 6/22 showed EF 65-70% with mild LVH, normal RV, IVC normal, PASP estimated 29 mmHg.    Patient was admitted in 9/22 with upper GI  bleed from bleeding gastric polyps.  He had polypectomy and has been restarted on Eliquis.    He was seen in the office 10/23/20 and BMET showed creatinine up to 4.18.  He was also noted to be Fe deficient.  Torsemide, metolazone, and spironolactone were stopped. Follow up he felt better. Planned to resume torsemide in a couple days, continued to hold spiro and metolazone. Feraheme infusions arranged.    Echo in 7/23 showed EF 60-65%, RV normal, PASP estimation was not elevated, IVC normal.    Most recently, Seen 10/23 for an acute visit when BP 70's and HR 44 at Neurology visit. Kidney function elevated and metolazone decreased back to once a week. Midodrine added.   He now presents to the ED w/ complaints of worsening SOB, LEE and roughly 18 lb wt gain over the last 3 wks, in the setting of missed meds, missed several evening doses of torsemide. He tried taking an extra dose of metolazone this week w/o response. In ED, noted to be hypoxic O2 sats 88% on RA, placed on 3L [place]. BP 92/59. CXR showed bilateral opacities ? pulmonary venous congestion or atypical infection. BNP 187, WBC nl 7.3, Hgb 9 b/l ~11, SCr 2.0, K 2.9, Hs trop negative x 2. EKG NSR w/ incomplete RBBB. UA + for UTI. UCx pending. Respiratory panel pending.   He is being admitted and started on IV Lasix and IV rocephin. Echo pending. AHF team asked to further assist.    Echo pending   Review  of Systems: [y] = yes, _0  = no   General: Weight gain [ Y]; Weight loss _1 ; Anorexia _2 ; Fatigue _3 ; Fever _4 ; Chills _5 ; Weakness _6   Cardiac: Chest pain/pressure _7 ; Resting SOB _8 ; Exertional SOB [Y ]; Orthopnea _9 ; Pedal Edema [ Y]; Palpitations _10 ; Syncope _11 ; Presyncope _12 ; Paroxysmal nocturnal dyspnea_13   Pulmonary: Cough _14 ; Wheezing_15 ; Hemoptysis_16 ; Sputum _17 ; Snoring _18   GI: Vomiting_19 ; Dysphagia_20 ; Melena_21 ; Hematochezia _22 ; Heartburn_23 ; Abdominal pain _24 ; Constipation _25 ; Diarrhea _26 ; BRBPR _27   GU:  Hematuria_28 ; Dysuria [Y ]; Nocturia_29   Vascular: Pain in legs with walking _30 ; Pain in feet with lying flat _31 ; Non-healing sores _32 ; Stroke _33 ; TIA _34 ; Slurred speech _35 ;  Neuro: Headaches_36 ; Vertigo_37 ; Seizures_38 ; Paresthesias_39 ;Blurred vision _40 ; Diplopia _41 ; Vision changes _42   Ortho/Skin: Arthritis _43 ; Joint pain _44 ; Muscle pain _45 ; Joint swelling _46 ; Back Pain _47 ; Rash _48   Psych: Depression_49 ; Anxiety_50   Heme: Bleeding problems _51 ; Clotting disorders _52 ; Anemia _53   Endocrine: Diabetes _54 ; Thyroid dysfunction_55   Home Medications Prior to Admission medications   Medication Sig Start Date End Date Taking? Authorizing Provider  acetaminophen (TYLENOL) 325 MG tablet Take 325 mg by mouth in the morning, at noon, and at bedtime.    [provider]  atorvastatin (LIPITOR) 40 MG tablet Take 1 tablet (40 mg total) by mouth daily at 6 PM. 04/27/19   Hosie Poisson, MD  cyanocobalamin (,VITAMIN B-12,) 1000 MCG/ML injection Inject 1,000 mcg into the muscle every 30 (thirty) days.    [provider]  ELIQUIS 2.5 MG TABS tablet TAKE ONE TABLET TWICE DAILY 07/08/21   Larey Dresser, MD  escitalopram (LEXAPRO) 10 MG tablet Take 10 mg by mouth daily.    [provider]  finasteride (PROSCAR) 5 MG tablet Take 5 mg by mouth daily.    [provider]  FLONASE SENSIMIST 27.5 MCG/SPRAY nasal spray Place 1 spray into the nose daily as needed for rhinitis or allergies.  12/11/18   [provider]  gabapentin (NEURONTIN) 100 MG capsule TAKE ONE CAPSULE AT BEDTIME 01/13/22   Ward Givens, NP  Iron, Ferrous Sulfate, 325 (65 Fe) MG TABS Take 325 mg by mouth daily. 12/31/20   Milford, Maricela Bo, FNP  metolazone (ZAROXOLYN) 2.5 MG tablet Take 1 tablet (2.5 mg total) by mouth once a week. 11/05/21   Larey Dresser, MD  metoprolol succinate (TOPROL-XL) 25 MG 24 hr tablet TAKE ONE-HALF TABLET AT BEDTIME 11/18/21   Larey Dresser, MD  midodrine  (PROAMATINE) 5 MG tablet Take 1 tablet (5 mg total) by mouth 3 (three) times daily with meals. 11/05/21   Larey Dresser, MD  NON FORMULARY Take 1 tablet by mouth See admin instructions. Unnamed laxative: Take 1 tablet by mouth at bedtime as needed for iron-induced constipation    [provider]  omeprazole (PRILOSEC) 20 MG capsule Take 1 capsule (20 mg total) by mouth 2 (two) times daily before a meal. 09/16/21   Sharyn Creamer, MD  OXYGEN Inhale 2 L/min into the lungs. As needed and at night    [provider]  potassium chloride SA (KLOR-CON M) 20 MEQ tablet Take 3 tablets (60 mEq total) by mouth  daily. With an additional 20 meq once a week for metolazone 11/05/21   Larey Dresser, MD  psyllium (METAMUCIL) 58.6 % packet Take 1 packet by mouth daily as needed.    [provider]  Riociguat (ADEMPAS) 2.5 MG TABS Take 2.5 mg by mouth 3 (three) times daily. 08/20/21   Larey Dresser, MD  rOPINIRole (REQUIP) 2 MG tablet TAKE ONE TABLET THREE TIMES DAILY 12/16/21   Ward Givens, NP  Selexipag (UPTRAVI) 1000 MCG TABS Take 2 tablets by mouth 2 (two) times daily. 12/31/21   Larey Dresser, MD  torsemide [facility]) 100 MG tablet TAKE ONE TABLET TWICE DAILY 09/24/21   Milford, Maricela Bo, FNP  traMADol (ULTRAM) 50 MG tablet Take 50 mg by mouth every 6 (six) hours. As needed    [provider]    Past Medical History: Past Medical History:  Diagnosis Date   Asthma    Barrett's esophagus    Benign prostatic hypertrophy    Bilateral lower extremity edema    CHF (congestive heart failure) (HCC)    Chronic fatigue    COPD (chronic obstructive pulmonary disease) (HCC)    Diabetes mellitus type 2 in nonobese ([place]) 04/24/2019   Diverticulosis of colon (without mention of hemorrhage)    Esophageal reflux    Gastritis    Gluten intolerance    Hiatal hernia 02/10/2011   Noted on CT Scan - Moderate Hiatal Hernia   Hyperlipemia    Hypertension    Lymphoma of lymph  nodes in pelvis ([place]) 03/03/2011    Large Right Retroperitoneal Mass   Melanoma ([place])    lymphoma   Microcytic anemia 04/20/2011   NHL (non-Hodgkin's 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 Collene Mares   Pulmonary hyperinflation    Renal cyst 02/10/2011    Noted on CT Scan - Bilateral Renal Cysts   S/P radiation therapy 03/15/11 - 04/09/11   Abdominal/ Pelvic Tumor, 3600 cGy/20 Fractions   Sleep apnea     Past Surgical History: Past Surgical History:  Procedure Laterality Date   ARTHROSCOPIC REPAIR ACL     right   BONE MARROW ASPIRATION  02/25/11   Bone Marrow, Aspirate, Clot, and Bilateral Bx, Right PIC   CATARACT EXTRACTION, BILATERAL     ESOPHAGOGASTRODUODENOSCOPY (EGD) WITH PROPOFOL N/A 10/19/2020   Procedure: ESOPHAGOGASTRODUODENOSCOPY (EGD) WITH PROPOFOL;  Surgeon: Sharyn Creamer, MD;  Location: [facility];  Service: Gastroenterology;  Laterality: N/A;   EYE SURGERY     HEMOSTASIS CLIP PLACEMENT  10/19/2020   Procedure: HEMOSTASIS CLIP PLACEMENT;  Surgeon: Sharyn Creamer, MD;  Location: [facility] ENDOSCOPY;  Service: Gastroenterology;;   HERNIA REPAIR     LEFT INGUINAL    INSERTION OF MESH N/A 08/23/2012   Procedure: INSERTION OF MESH;  Surgeon: Madilyn Hook, DO;  Location: [facility];  Service: General;  Laterality: N/A;   LOOP RECORDER INSERTION N/A 04/26/2019   Procedure: LOOP RECORDER INSERTION;  Surgeon: Evans Lance, MD;  Location: [facility];  Service: Cardiovascular;  Laterality: N/A;   LUMBAR LAMINECTOMY/DECOMPRESSION MICRODISCECTOMY Right 12/31/2013   Procedure: RIGHT L4-5 L5-S1 LAMINECTOMY;  Surgeon: Kristeen Miss, MD;  Location: [facility];  Service: Neurosurgery;  Laterality: Right;  RIGHT L4-5 L5-S1 LAMINECTOMY   POLYPECTOMY  10/19/2020   Procedure: POLYPECTOMY;  Surgeon: Dayna Barker  C, MD;  Location: [facility];  Service: Gastroenterology;;   RIGHT HEART CATH N/A 07/25/2018   Procedure: RIGHT HEART CATH;  Surgeon: Larey Dresser, MD;  Location: [facility];  Service: Cardiovascular;  Laterality: N/A;   ROTATOR CUFF REPAIR     right   TONSILLECTOMY     TONSILLECTOMY     VENTRAL HERNIA REPAIR N/A 08/23/2012   Procedure: HERNIA REPAIR VENTRAL ADULT;  Surgeon: Madilyn Hook, DO;  Location: [facility];  Service: General;  Laterality: N/A;    Family History: Family History  Problem Relation Age of Onset   Dementia Mother    Heart disease Father        MI 85   Urticaria Father    Restless legs syndrome Father    Dementia Sister    Parkinson's disease Sister    ALS Sister    Prostate cancer Brother    Prostate cancer Paternal Uncle    Prostate cancer Paternal Uncle    Prostate cancer Paternal Uncle    Hyperlipidemia Other    Stroke Other    Hypertension Other    ADD / ADHD Other    Colon cancer Neg Hx    Allergic rhinitis Neg Hx    Asthma Neg Hx    Eczema Neg Hx     Social History: 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[employer], [employer]  Tobacco Use   Smoking status: Former    Packs/day: 1.00    Years: 35.00    Total pack years: 35.00    Types: Pipe, Cigarettes    Quit date: 02/23/1992    Years since quitting: 29.9   Smokeless tobacco: Never   Tobacco comments:    quit 20 years ago  Vaping Use   Vaping Use: Never used  Substance and Sexual Activity   Alcohol use: Not Currently    Comment: 2 drinks daily scotch  AND WINE WITH SUPPER   Drug use: No   Sexual activity: Not on file  Other Topics Concern   Not on file  Social History Narrative   Married - second marriage   He has two children   Retired Horticulturist, commercial  Professor   Currently teaches pottery making, 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[facility]   Former Smoker quit 12 years ago- smoked for 35 years   Alcohol use- not currently   Right handed   Social Determinants of Radio broadcast assistant Strain: Not on file  Food Insecurity: Not on file  Transportation Needs: Not on file  Physical Activity: Not on file  Stress: Not on file  Social Connections: Not on file    Allergies:  Allergies  Allergen Reactions   Pollen Extract-Tree Extract Other (See Comments)    "HEADACHES, TIRED, DRAINAGE FROM SINUSES"   Feraheme [Ferumoxytol] Other (See Comments)    Chest pain, facial flushing, shortness of breath   Molds & Smuts Other (See Comments)    Also dust mites causes sinus infections, h/a etc.    Objective:    Vital Signs:   Temp:  [97.9 F (36.6 C)-98.2 F (36.8 C)] 98 F (36.7 C) (01/03 1000) Pulse Rate:  [81-94] 94 (01/03 1130) Resp:  [17-29] 27 (01/03 1130) BP: (  94-110)/(54-73) 94/54 (01/03 1130) SpO2:  [92 %-100 %] 92 % (01/03 1130) Weight:  [84.8 kg] 84.8 kg (01/03 0543) Last BM Date : 02/03/22  Weight change: Filed Weights   02/03/22 0543  Weight: 84.8 kg    Intake/Output:   Intake/Output Summary (Last 24 hours) at 02/03/2022 1305 Last data filed at 02/03/2022 0709 Gross per 24 hour  Intake 100.35 ml  Output 225 ml  Net -124.65 ml      Physical Exam    General:  elderly WM.  No resp difficulty HEENT: normal Neck: supple. JVP to jaw . Carotids 2+ bilat; no bruits. No lymphadenopathy or thyromegaly appreciated. Cor: PMI nondisplaced. Regular rate & rhythm. No rubs, gallops or murmurs. Lungs: decreases BS at the bases bilaterally  Abdomen: soft, nontender, nondistended. No hepatosplenomegaly. No bruits or masses. Good bowel sounds. Extremities: no cyanosis, clubbing, rash, 3+ b/l LE edema Neuro: alert & orientedx3, cranial nerves grossly intact. moves all 4 extremities w/o difficulty. Affect pleasant   Telemetry   NSR  70s   EKG    NSR w/ incomplete RBBB   Labs   Basic Metabolic Panel: Recent Labs  Lab 02/02/22 1209 02/03/22 0630  NA 134* 140  K 2.9* 2.9*  CL 94* 94*  CO2 26 30  GLUCOSE 93 84  BUN 111* 98*  CREATININE 2.00* 1.82*  CALCIUM 6.8* 6.6*    Liver Function Tests: Recent Labs  Lab 02/03/22 0630  AST 10*  ALT 9  ALKPHOS 103  BILITOT 0.6  PROT 6.4*  ALBUMIN 3.0*   No results for input(s): "LIPASE", "AMYLASE" in the last 168 hours. No results for input(s): "AMMONIA" in the last 168 hours.  CBC: Recent Labs  Lab 02/02/22 1209 02/03/22 0630  WBC 7.6 7.3  HGB 9.0* 9.0*  HCT 30.0* 29.2*  MCV 86.2 84.4  PLT 350 347    Cardiac Enzymes: No results for input(s): "CKTOTAL", "CKMB", "CKMBINDEX", "TROPONINI" in the last 168 hours.  BNP: BNP (last 3 results) Recent Labs    07/28/21 1439 11/05/21 1229 02/02/22 1209  BNP 40.7 61.0 187.7*    ProBNP (last 3 results) No results for input(s): "PROBNP" in the last 8760 hours.   CBG: No results for input(s): "GLUCAP" in the last 168 hours.  Coagulation Studies: No results for input(s): "LABPROT", "INR" in the last 72 hours.   Imaging   No results found.   Medications:     Current Medications:  apixaban  2.5 mg Oral BID   escitalopram  10 mg Oral Daily   finasteride  5 mg Oral Daily   furosemide  80 mg Intravenous BID   gabapentin  100 mg Oral QHS   Iron (Ferrous Sulfate)  325 mg Oral Daily   midodrine  10 mg Oral TID WC   potassium chloride  40 mEq Oral STAT   [START ON 02/04/2022] potassium chloride SA  60 mEq Oral Daily   Riociguat  2.5 mg Oral TID   rOPINIRole  2 mg Oral TID   Selexipag  2 tablet Oral BID   sodium chloride flush  3 mL Intravenous Q12H    Infusions:  cefTRIAXone (ROCEPHIN)  IV Stopped (02/03/22 0615)      Patient Profile   89 y/o male w/ chronic diastolic heart failure w/ prominent RV dysfunction in setting of pulmonary HTN, PAF, h/o CVA, OSA intolerant of CPAP, ILD and  carotid artery stenosis, admitted w/ acute on chronic RV failure and UTI.   Assessment/Plan   1. Acute  on Chronic diastolic CHF: Suspect with significant component of RV failure from pulmonary hypertension.  Echo in 6/20 with EF 60-65%, RV not well-visualized but PASP 59 mmHg.  Echo (3/21) with EF 60-65%, severe LVH, normal RV, PASP 28 mmHg.  PYP scan was not suggestive of TTR cardiac amyloidosis. Echo in 6/22 showed EF 65-70% with mild LVH, normal RV, IVC normal, PASP estimated 29 mmHg.  Markedly volume overloaded in the setting of poor compliance w/ home diuretic regimen. NYHA Class IIIb-IV.  - continue IV Lasix 80 mg bid w/ aggressive K supp  - Unable to wear compression stockings/UNNA boots or elevate legs due to RLS.  - Continue midodrine 10 mg tid to promote higher BP in setting of RV failure. - Continue Toprol XL 12.5 mg qhs. - Continue Riociquat 2.5 mg tid  - repeat echo  2. AKI on CKD Stage 3b: B/l Scr ~1.7. Scr 2.1 on admit. UA + for UTI  - monitor closely w/ diuresis  - abx per IM  3. Pulmonary hypertension: Patient thought to have [place] out of proportion to his lung disease (ILD, NSIP versus UIP). RHC in 2/20 showed mean PA pressure 41, PVR 5.1 WU, CI 3. Echo in 6/20 with RV reportedly ok => I reviewed and do not think that RV was well-visualized.  Patient did not tolerate Adcirca due to edema/?cognitive effects.  RHC was done again in 6/20, showed moderate pulmonary arterial hypertension with PVR 5.35 (comparable to prior).  Serological workup for PH was negative. Patient started ambrisentan 5 mg and developed significant edema/volume overload despite a high dose of torsemide. He is now on Uptravi + riociguat.  Most recent echo in 7/23 showed normal RV size/systolic function and PA systolic pressure estimate was normal.   - He will stay off ambrisentan (? trigger for volume overload).  - As above, he did not tolerate tadalafil.  - He is at 2000 mcg bid Uptravi.  With excellent echo in  7/23, will keep at this dose.   - Continue riociguat at goal dose 2.5 mg tid.  - update echo  4. Atrial fibrillation: Paroxysmal, noted on ILR. NSR on admission. - Continue apixaban 2.5 mg bid (dosed for age, renal dysfunction).  - Toprol XL 12.5 mg daily  5. OSA: Cannot tolerate CPAP.  6. CVA: In 3/21, may have been atrial fibrillation-related.  On apixaban now.  7. Carotid stenosis: Carotid duplex 04/23: 40-59% right ICA stenosis, no significant stenosis on the left - Continue statin. - Repeat study 4/24  8. Interstitial lung disease:  Has seen pulmonary, ILD seems mild and stable.  9. UTI: UA +. UCx pending.  - on IV rocephin per IM  10. Hypokalemia: 2.9 on admit - aggressive K supp w/ diuresis - check Mg - f/u BMP this evening   Length of Stay: 0  Brittainy Simmons, PA-C  02/03/2022, 1:05 PM  Advanced Heart Failure Team Pager [phone] (M-F; 7a - 5p)  Please contact [facility] for night-coverage after hours (4p -7a ) and weekends on [email or web address]  Patient seen with PA, agree with the above note.   Patient returns with almost 20 lb weight gain and increased dyspnea for several days.  Baseline NYHA class III symptoms.  He may have missed some of his meds. BUN 98 with creatinine around 1.82, this is around baseline for him.   I reviewed the echo done today, EF 65-70% with D-shaped septum, mild RV dilation/dysfunction, dilated IVC.   General: NAD Neck: JVP 14-16 cm, no  thyromegaly or thyroid nodule.  Lungs: Clear to auscultation bilaterally with normal respiratory effort. CV: Nondisplaced PMI.  Heart regular S1/S2, no S3/S4, no murmur.  2+ edema to knees.  No carotid bruit.  Normal pedal pulses.  Abdomen: Soft, nontender, no hepatosplenomegaly, no distention.  Skin: Intact without lesions or rashes.  Neurologic: Alert and oriented x 3.  Psych: Normal affect. Extremities: No clubbing or cyanosis.  HEENT: Normal.   Patient returns with volume overload/RV failure.  May not  have been receiving all his medications prior to admission. Volume overloaded on exam with creatinine elevated but near his baseline.  - Lasix 80 mg IV bid, good response so far.  Follow I/Os closely.   - Will arrange for RHC after further diuresis, possibly Friday.  Want to reassess PA pressure.  For now, he will continue his current PH meds with Adempas and selexipag.   UA shows UTI, he has had ceftriaxone.   Loralie Champagne 02/03/2022 3:53 PM

## 2022-02-04 DIAGNOSIS — I5033 Acute on chronic diastolic (congestive) heart failure: Secondary | ICD-10-CM | POA: Diagnosis not present

## 2022-02-04 DIAGNOSIS — E7800 Pure hypercholesterolemia, unspecified: Secondary | ICD-10-CM | POA: Diagnosis not present

## 2022-02-04 DIAGNOSIS — I480 Paroxysmal atrial fibrillation: Secondary | ICD-10-CM | POA: Diagnosis not present

## 2022-02-04 DIAGNOSIS — D509 Iron deficiency anemia, unspecified: Secondary | ICD-10-CM | POA: Diagnosis not present

## 2022-02-04 LAB — CBC
HCT: 33.5 % — ABNORMAL LOW (ref 39.0–52.0)
Hemoglobin: 10.2 g/dL — ABNORMAL LOW (ref 13.0–17.0)
MCH: 25.8 pg — ABNORMAL LOW (ref 26.0–34.0)
MCHC: 30.4 g/dL (ref 30.0–36.0)
MCV: 84.8 fL (ref 80.0–100.0)
Platelets: 405 10*3/uL — ABNORMAL HIGH (ref 150–400)
RBC: 3.95 MIL/uL — ABNORMAL LOW (ref 4.22–5.81)
RDW: 16.9 % — ABNORMAL HIGH (ref 11.5–15.5)
WBC: 10.7 10*3/uL — ABNORMAL HIGH (ref 4.0–10.5)
nRBC: 0 % (ref 0.0–0.2)

## 2022-02-04 LAB — BASIC METABOLIC PANEL
Anion gap: 15 (ref 5–15)
Anion gap: 14 (ref 5–15)
BUN: 76 mg/dL — ABNORMAL HIGH (ref 8–23)
BUN: 64 mg/dL — ABNORMAL HIGH (ref 8–23)
CO2: 31 mmol/L (ref 22–32)
CO2: 30 mmol/L (ref 22–32)
Calcium: 7.2 mg/dL — ABNORMAL LOW (ref 8.9–10.3)
Calcium: 7.1 mg/dL — ABNORMAL LOW (ref 8.9–10.3)
Chloride: 95 mmol/L — ABNORMAL LOW (ref 98–111)
Chloride: 96 mmol/L — ABNORMAL LOW (ref 98–111)
Creatinine, Ser: 1.54 mg/dL — ABNORMAL HIGH (ref 0.61–1.24)
Creatinine, Ser: 1.5 mg/dL — ABNORMAL HIGH (ref 0.61–1.24)
GFR, Estimated: 43 mL/min — ABNORMAL LOW (ref >60)
GFR, Estimated: 45 mL/min — ABNORMAL LOW (ref >60)
Glucose, Bld: 155 mg/dL — ABNORMAL HIGH (ref 70–99)
Glucose, Bld: 171 mg/dL — ABNORMAL HIGH (ref 70–99)
Potassium: 2.9 mmol/L — ABNORMAL LOW (ref 3.5–5.1)
Potassium: 3.3 mmol/L — ABNORMAL LOW (ref 3.5–5.1)
Sodium: 141 mmol/L (ref 135–145)
Sodium: 140 mmol/L (ref 135–145)

## 2022-02-04 LAB — URINE CULTURE: Culture: <10000 — AB

## 2022-02-04 MED ORDER — SODIUM CHLORIDE 0.9% FLUSH
3.0000 mL | Freq: Two times a day (BID) | INTRAVENOUS | Status: DC
  Administered 2022-02-04 – 2022-02-08 (×5): 3 mL via INTRAVENOUS

## 2022-02-04 MED ORDER — ROPINIROLE HCL 1 MG PO TABS
2.0000 mg | ORAL_TABLET | Freq: Four times a day (QID) | ORAL | Status: DC
  Administered 2022-02-04 – 2022-02-08 (×15): 2 mg via ORAL
  Filled 2022-02-04 (×17): qty 2

## 2022-02-04 MED ORDER — MENTHOL 3 MG MT LOZG
1.0000 | LOZENGE | OROMUCOSAL | Status: DC | PRN
  Filled 2022-02-04: qty 9

## 2022-02-04 MED ORDER — POTASSIUM CHLORIDE CRYS ER 20 MEQ PO TBCR
40.0000 meq | EXTENDED_RELEASE_TABLET | ORAL | Status: AC
  Administered 2022-02-04 (×2): 40 meq via ORAL
  Filled 2022-02-04 (×2): qty 2

## 2022-02-04 MED ORDER — POTASSIUM CHLORIDE CRYS ER 20 MEQ PO TBCR
40.0000 meq | EXTENDED_RELEASE_TABLET | Freq: Once | ORAL | Status: DC
  Administered 2022-02-04: 40 meq via ORAL

## 2022-02-04 MED ORDER — SALINE SPRAY 0.65 % NA SOLN
1.0000 | NASAL | Status: DC | PRN
  Administered 2022-02-07: 1 via NASAL
  Filled 2022-02-04: qty 44

## 2022-02-04 NOTE — Progress Notes (Addendum)
Advanced Heart Failure Rounding Note  PCP-Cardiologist: [name], MD   Subjective:    Diuresed well with 80 IV lasix BID. -3.6 L UOP, down 7lbs  Feels fine this morning. Complaining of RLS, dry mouth and nose. Denis CP. Breathing stable.    Objective:   Weight Range: 82 kg Body mass index is 30.08 kg/m.   Vital Signs:   Temp:  [97.6 F (36.4 C)-98 F (36.7 C)] 97.8 F (36.6 C) (01/04 0722) Pulse Rate:  [75-94] 87 (01/04 0722) Resp:  [14-27] 18 (01/04 0722) BP: (94-129)/(47-69) 104/62 (01/04 0722) SpO2:  [92 %-100 %] 97 % (01/04 0722) Weight:  [82 kg] 82 kg (01/03 2333) Last BM Date : 02/03/22  Weight change: Filed Weights   02/03/22 0543 02/03/22 2333  Weight: 84.8 kg 82 kg    Intake/Output:   Intake/Output Summary (Last 24 hours) at 02/04/2022 0804 Last data filed at 02/04/2022 0533 Gross per 24 hour  Intake 480 ml  Output 3400 ml  Net -2920 ml      Physical Exam    General:  elderly appearing.  No respiratory difficulty HEENT: normal Neck: supple. JVD ~14. Carotids 2+ bilat; no bruits. No lymphadenopathy or thyromegaly appreciated. Cor: PMI nondisplaced. Tachy rate & regular rhythm. No rubs, gallops or murmurs. Lungs: clear, tachypnea  Abdomen: soft, nontender, nondistended. No hepatosplenomegaly. No bruits or masses. Good bowel sounds. Extremities: no cyanosis, clubbing, rash, +2 BLE edema to knees Neuro: alert & oriented x 3, cranial nerves grossly intact. moves all 4 extremities w/o difficulty. Affect pleasant.    Telemetry   ST low 100s 2-4 PVCs (Personally reviewed)    EKG    No new EKG to review  Labs    CBC Recent Labs    02/03/22 0630 02/04/22 0038  WBC 7.3 10.7*  HGB 9.0* 10.2*  HCT 29.2* 33.5*  MCV 84.4 84.8  PLT 347 625*   Basic Metabolic Panel Recent Labs    02/03/22 0630 02/04/22 0038  NA 140 141  K 2.9* 2.9*  CL 94* 95*  CO2 30 31  GLUCOSE 84 155*  BUN 98* 76*  CREATININE 1.82* 1.54*  CALCIUM 6.6* 7.2*   MG 2.0  --    Liver Function Tests Recent Labs    02/03/22 0630  AST 10*  ALT 9  ALKPHOS 103  BILITOT 0.6  PROT 6.4*  ALBUMIN 3.0*   No results for input(s): "LIPASE", "AMYLASE" in the last 72 hours. Cardiac Enzymes No results for input(s): "CKTOTAL", "CKMB", "CKMBINDEX", "TROPONINI" in the last 72 hours.  BNP: BNP (last 3 results) Recent Labs    07/28/21 1439 11/05/21 1229 02/02/22 1209  BNP 40.7 61.0 187.7*    ProBNP (last 3 results) No results for input(s): "PROBNP" in the last 8760 hours.   D-Dimer No results for input(s): "DDIMER" in the last 72 hours. Hemoglobin A1C No results for input(s): "HGBA1C" in the last 72 hours. Fasting Lipid Panel No results for input(s): "CHOL", "HDL", "LDLCALC", "TRIG", "CHOLHDL", "LDLDIRECT" in the last 72 hours. Thyroid Function Tests No results for input(s): "TSH", "T4TOTAL", "T3FREE", "THYROIDAB" in the last 72 hours.  Invalid input(s): "FREET3"  Other results:   Imaging    ECHOCARDIOGRAM COMPLETE  Result Date: 02/03/2022    ECHOCARDIOGRAM REPORT   Patient Name:   KAMAURY CUTBIRTH Date of Exam: 02/03/2022 Medical Rec #:  [id number]       Height:       65.0 in Accession #:    [id number]  Weight:       187.0 lb Date of Birth:  [birth year]      BSA:          1.922 m Patient Age:    68 years        BP:           94/54 mmHg Patient Gender: M               HR:           89 bpm. Exam Location:  Inpatient Procedure: 2D Echo, Cardiac Doppler and Color Doppler Indications:    CHF-Acute Diastolic A07.62  History:        Patient has prior history of Echocardiogram examinations, most                 recent 08/17/2021. CHF, Stroke and COPD; Risk                 Factors:Hypertension, Sleep Apnea, Diabetes and Dyslipidemia.                 CKD stage III.  Sonographer:    Ronny Flurry Referring Phys: [id number] RONDELL A SMITH IMPRESSIONS  1. Left ventricular ejection fraction, by estimation, is 65 to 70%. The left ventricle has normal  function. The left ventricle has no regional wall motion abnormalities. Left ventricular diastolic parameters are consistent with Grade II diastolic dysfunction (pseudonormalization). Elevated left atrial pressure.  2. Right ventricular systolic function was not well visualized. The right ventricular size is not well visualized. Tricuspid regurgitation signal is inadequate for assessing PA pressure.  3. Left atrial size was mildly dilated.  4. The mitral valve is grossly normal. No evidence of mitral valve regurgitation.  5. The aortic valve is tricuspid. Aortic valve regurgitation is not visualized. Aortic valve sclerosis is present, with no evidence of aortic valve stenosis.  6. The inferior vena cava is normal in size with greater than 50% respiratory variability, suggesting right atrial pressure of 3 mmHg.  7. Cannot exclude a small PFO. Comparison(s): No significant change from prior study. FINDINGS  Left Ventricle: Left ventricular ejection fraction, by estimation, is 65 to 70%. The left ventricle has normal function. The left ventricle has no regional wall motion abnormalities. The left ventricular internal cavity size was small. Suboptimal image quality limits for assessment of left ventricular hypertrophy. Left ventricular diastolic parameters are consistent with Grade II diastolic dysfunction (pseudonormalization). Elevated left atrial pressure. Right Ventricle: The right ventricular size is not well visualized. Right vetricular wall thickness was not well visualized. Right ventricular systolic function was not well visualized. Tricuspid regurgitation signal is inadequate for assessing PA pressure. Left Atrium: Left atrial size was mildly dilated. Right Atrium: Right atrial size was normal in size. Pericardium: There is no evidence of pericardial effusion. Mitral Valve: The mitral valve is grossly normal. Mild to moderate mitral annular calcification. No evidence of mitral valve regurgitation. Tricuspid  Valve: The tricuspid valve is normal in structure. Tricuspid valve regurgitation is not demonstrated. No evidence of tricuspid stenosis. Aortic Valve: The aortic valve is tricuspid. There is mild aortic valve annular calcification. Aortic valve regurgitation is not visualized. Aortic valve sclerosis is present, with no evidence of aortic valve stenosis. Aortic valve mean gradient measures  7.0 mmHg. Aortic valve peak gradient measures 12.1 mmHg. Aortic valve area, by VTI measures 2.50 cm. Pulmonic Valve: The pulmonic valve was not well visualized. Pulmonic valve regurgitation is not visualized. Aorta: The aortic root and ascending aorta are structurally  normal, with no evidence of dilitation. Venous: The inferior vena cava is normal in size with greater than 50% respiratory variability, suggesting right atrial pressure of 3 mmHg. IAS/Shunts: Cannot exclude a small PFO.  LEFT VENTRICLE PLAX 2D LVOT diam:     2.00 cm   Diastology LV SV:         77        LV e' medial:    6.20 cm/s LV SV Index:   40        LV E/e' medial:  19.8 LVOT Area:     3.14 cm  LV e' lateral:   8.59 cm/s                          LV E/e' lateral: 14.3  RIGHT VENTRICLE RV S prime:     17.30 cm/s TAPSE (M-mode): 1.5 cm LEFT ATRIUM             Index        RIGHT ATRIUM           Index LA Vol (A2C):   64.3 ml 33.45 ml/m  RA Area:     11.00 cm LA Vol (A4C):   50.3 ml 26.16 ml/m  RA Volume:   17.10 ml  8.89 ml/m LA Biplane Vol: 58.8 ml 30.59 ml/m  AORTIC VALVE AV Area (Vmax):    2.65 cm AV Area (Vmean):   2.57 cm AV Area (VTI):     2.50 cm AV Vmax:           174.00 cm/s AV Vmean:          119.000 cm/s AV VTI:            0.309 m AV Peak Grad:      12.1 mmHg AV Mean Grad:      7.0 mmHg LVOT Vmax:         146.50 cm/s LVOT Vmean:        97.400 cm/s LVOT VTI:          0.246 m LVOT/AV VTI ratio: 0.80  AORTA Ao Root diam: 3.50 cm Ao Asc diam:  2.80 cm MITRAL VALVE MV Area (PHT): 3.65 cm     SHUNTS MV Decel Time: 208 msec     Systemic VTI:  0.25 m  MV E velocity: 123.00 cm/s  Systemic Diam: 2.00 cm MV A velocity: 104.00 cm/s MV E/A ratio:  1.18 Rudean Haskell MD Electronically signed by Rudean Haskell MD Signature Date/Time: 02/03/2022/1:24:10 PM    Final      Medications:     Scheduled Medications:  apixaban  2.5 mg Oral BID   atorvastatin  40 mg Oral q1800   escitalopram  10 mg Oral Daily   ferrous sulfate  325 mg Oral Daily   finasteride  5 mg Oral Daily   furosemide  80 mg Intravenous BID   gabapentin  100 mg Oral QHS   midodrine  10 mg Oral TID WC   potassium chloride SA  60 mEq Oral Daily   Riociguat  2.5 mg Oral TID   rOPINIRole  2 mg Oral TID   Selexipag  1,600 mcg Oral Q12H   And   Selexipag  400 mcg Oral Q12H   sodium chloride flush  3 mL Intravenous Q12H    Infusions:  cefTRIAXone (ROCEPHIN)  IV 1 g (02/04/22 0700)    PRN Medications: acetaminophen **OR** acetaminophen, fluticasone, sodium chloride, traMADol  Patient Profile   89 y/o male w/ chronic diastolic heart failure w/ prominent RV dysfunction in setting of pulmonary HTN, PAF, h/o CVA, OSA intolerant of CPAP, ILD and carotid artery stenosis, admitted w/ acute on chronic RV failure and UTI.    Assessment/Plan   1. Acute on Chronic diastolic CHF: Suspect with significant component of RV failure from pulmonary hypertension.  Echo in 6/20 with EF 60-65%, RV not well-visualized but PASP 59 mmHg.  Echo (3/21) with EF 60-65%, severe LVH, normal RV, PASP 28 mmHg.  PYP scan was not suggestive of TTR cardiac amyloidosis. Echo in 6/22 showed EF 65-70% with mild LVH, normal RV, IVC normal, PASP estimated 29 mmHg.  Markedly volume overloaded in the setting of poor compliance w/ home diuretic regimen. NYHA Class IIIb-IV.  - Echo 02/03/22: EF 65-70% with D-shaped septum, mild RV dilation/dysfunction, dilated IVC.   - Continue IV Lasix 80 mg bid w/ aggressive K supp  - Unable to wear compression stockings/UNNA boots or elevate legs due to RLS.  - Continue  midodrine 10 mg tid to promote higher BP in setting of RV failure. - Continue Toprol XL 12.5 mg qhs. - Continue Riociquat 2.5 mg tid 2. AKI on CKD Stage 3b: B/l Scr ~1.7. Scr 2.1 on admit. UA + for UTI  - monitor closely w/ diuresis, down to 1.54 today - abx per IM  3. Pulmonary hypertension: Patient thought to have [place] out of proportion to his lung disease (ILD, NSIP versus UIP). RHC in 2/20 showed mean PA pressure 41, PVR 5.1 WU, CI 3. Echo in 6/20 with RV reportedly ok => I reviewed and do not think that RV was well-visualized.  Patient did not tolerate Adcirca due to edema/?cognitive effects.  RHC was done again in 6/20, showed moderate pulmonary arterial hypertension with PVR 5.35 (comparable to prior).  Serological workup for PH was negative. Patient started ambrisentan 5 mg and developed significant edema/volume overload despite a high dose of torsemide. He is now on Uptravi + riociguat. Echo 7/23 showed normal RV size/systolic function and PA systolic pressure estimate was normal.   - He will stay off ambrisentan (? trigger for volume overload).  - As above, he did not tolerate tadalafil.  - He is at 2000 mcg bid Uptravi.  With excellent echo in 7/23, will keep at this dose.   - Continue riociguat at goal dose 2.5 mg tid.  - updated echo as above  4. Atrial fibrillation: Paroxysmal, noted on ILR. NSR on admission. - Continue apixaban 2.5 mg bid (dosed for age, renal dysfunction).  - Toprol XL 12.5 mg daily  5. OSA: Cannot tolerate CPAP.  6. CVA: In 3/21, may have been atrial fibrillation-related.  On apixaban now.  7. Carotid stenosis: Carotid duplex 04/23: 40-59% right ICA stenosis, no significant stenosis on the left - Continue statin. - Repeat study 4/24  8. Interstitial lung disease:  Has seen pulmonary, ILD seems mild and stable.  9. UTI: UA +. UCx pending.  - on IV rocephin per IM  10. Hypokalemia: 2.9 on admit - aggressive K supp w/ diuresis, 2.9 again today - Mg 2   Length  of Stay: [place], NP  02/04/2022, 8:04 AM  Advanced Heart Failure Team Pager [phone] (M-F; 7a - 5p)  Please contact [facility] for night-coverage after hours (5p -7a ) and weekends on [email or web address]  Patient seen with NP, agree with the above note. He diuresed well yesterday, weight down 7 lbs.  Creatinine down to 1.54.  He is getting Lasix 80 mg IV bid.   General: NAD Neck: JVP 12-14 cm, no thyromegaly or thyroid nodule.  Lungs: Clear to auscultation bilaterally with normal respiratory effort. CV: Nondisplaced PMI.  Heart regular S1/S2, no S3/S4, no murmur.  1+ edema to knees.  Abdomen: Soft, nontender, no hepatosplenomegaly, no distention.  Skin: Intact without lesions or rashes.  Neurologic: Alert and oriented x 3.  Psych: Normal affect. Extremities: No clubbing or cyanosis.  HEENT: Normal.   Patient returns with volume overload/RV failure.  May not have been receiving all his medications prior to admission. Volume overloaded on exam with creatinine trending down with diuresis.   - Lasix 80 mg IV bid, good response so far.  Continue today.   - Will arrange for RHC after further diuresis tomorrow.  Want to reassess PA pressure.  For now, he will continue his current PH meds with Adempas and selexipag. Discussed risks/benefits and patient agrees to procedure.    UA shows UTI, he is getting ceftriaxone.   Mobilize.  Loralie Champagne 02/04/2022 9:48 AM

## 2022-02-04 NOTE — Progress Notes (Signed)
   02/04/22 1500  Mobility  Activity Ambulated with assistance to bathroom  Level of Assistance Standby assist, set-up cues, supervision of patient - no hands on  Assistive Device Front wheel walker  Distance Ambulated (ft) 20 ft  Activity Response Tolerated well  Mobility Referral Yes  $Mobility charge 1 Mobility   Mobility Specialist Progress Note  Pt requesting to use bathroom. Had no c/o pain. Returned to bed w/ all needs met and call bell in reach.   Ryan [name] Mobility Specialist  Please contact via SecureChat or Rehab office at [phone]

## 2022-02-04 NOTE — Progress Notes (Addendum)
PROGRESS NOTE        PATIENT DETAILS Name: Ryan [name] Age: 89 y.o. Sex: male Date of Birth: July 06, 1931 Admit Date: 02/02/2022 Admitting Physician Rhetta Mura, DO GYI:RSWNIOE, Rocco Pauls, NP  Brief Summary: Patient is a 89 y.o.  male with history of chronic HFpEF, right-sided heart failure, ILD, PAF, COPD, non-Hodgkin's lymphoma-s/p radiation therapy now on remission-presented with decompensated heart failure and AKI.  Significant events: 1/2>> admit to [facility]-decompensated heart failure  Significant studies: 1/2>> CXR: Prominent bilateral pulmonary interstitial opacities. 1/3>> echo: EF 70-35%, grade 2 diastolic dysfunction.  Significant microbiology data: 1/3>> COVID/influenza/RSV PCR: Negative 1/3>> urine culture: < 10,000 colonies/mL-insignificant growth  Procedures: None  Consults: Cardiology  Subjective: Erythema/swelling of bilateral lower extremity better but still with not at baseline.  Wants his restless leg medications adjusted.  Objective: Vitals: Blood pressure 104/62, pulse 87, temperature 97.8 F (36.6 C), temperature source Oral, resp. rate 18, height 5' 5" (1.651 m), weight 82 kg, SpO2 95 %.   Exam: Gen Exam:Alert awake-not in any distress HEENT:atraumatic, normocephalic Chest: B/L clear to auscultation anteriorly CVS:S1S2 regular Abdomen:soft non tender, non distended Extremities: ++ edema-some erythema up to upper legs. Neurology: Non focal Skin: no rash  Pertinent Labs/Radiology:    Latest Ref Rng & Units 02/04/2022   12:38 AM 02/03/2022    6:30 AM 02/02/2022   12:09 PM  CBC  WBC 4.0 - 10.5 K/uL 10.7  7.3  7.6   Hemoglobin 13.0 - 17.0 g/dL 10.2  9.0  9.0   Hematocrit 39.0 - 52.0 % 33.5  29.2  30.0   Platelets 150 - 400 K/uL 405  347  350     Lab Results  Component Value Date   NA 141 02/04/2022   K 2.9 (L) 02/04/2022   CL 95 (L) 02/04/2022   CO2 31 02/04/2022      Assessment/Plan: Acute on chronic  HFpEF RV failure from pulmonary hypertension Remains volume overloaded Continue diuretics/midodrine and other supportive care Cardiology following-RHC planned for 1/5  Pulmonary hypertension Thought to be due to [facility] and ILD Remains on Uptravi and riociguat RHC scheduled for tomorrow.  AKI on CKD stage IIIb AKI hemodynamically mediated-improving with supportive care  Hypokalemia Due to diuretic Replete/recheck  Complicated UTI Possible bilateral lower extremity cellulitis versus erythema from venous congestion in the setting of edema Nontoxic-appearing-afebrile Urine cultures negative but still has some erythema Given diagnostic uncertainty-reasonable to treat with 5 days of IV antibiotics. Suspect erythema/edema will improve with further diuresis.  PAF Sinus rhythm On metoprolol/Eliquis.  Normocytic anemia Likely due to CKD Watch closely.  Chronic hypoxic respiratory failure on home O2 ILD Appears stable-remains on 2 L of oxygen  BPH Continue Proscar No signs of urinary retention-felt significantly good urine output overnight.  History of non-Hodgkin's lymphoma-s/p radiation therapy Currently in remission  GERD/hiatal hernia Continue PPI  Restless leg syndrome Continue Requip At patient's request-have spoken with pharmacy to see if we can change to 4 times daily dosing.  Obesity: Estimated body mass index is 30.08 kg/m as calculated from the following:   Height as of this encounter: 5' 5" (1.651 m).   Weight as of this encounter: 82 kg.   Code status:   Code Status: DNR   DVT Prophylaxis: SCDs Start: 02/03/22 0759 SCDs Start: 02/03/22 0542 Eliquis  Family Communication: None at bedside   Disposition  Plan: Status is: Inpatient Remains inpatient appropriate because: Severity of illness   Planned Discharge Destination:Home health   Diet: Diet Order             Diet Heart Room service appropriate? Yes; Fluid consistency: Thin  Diet effective  now                     Antimicrobial agents: Anti-infectives (From admission, onward)    Start     Dose/Rate Route Frequency Ordered Stop   02/03/22 0515  cefTRIAXone (ROCEPHIN) 1 g in sodium chloride 0.9 % 100 mL IVPB        1 g 200 mL/hr over 30 Minutes Intravenous Every 24 hours 02/03/22 0501          MEDICATIONS: Scheduled Meds:  atorvastatin  40 mg Oral q1800   escitalopram  10 mg Oral Daily   ferrous sulfate  325 mg Oral Daily   finasteride  5 mg Oral Daily   furosemide  80 mg Intravenous BID   gabapentin  100 mg Oral QHS   midodrine  10 mg Oral TID WC   potassium chloride SA  60 mEq Oral Daily   Riociguat  2.5 mg Oral TID   rOPINIRole  2 mg Oral TID   Selexipag  1,600 mcg Oral Q12H   And   Selexipag  400 mcg Oral Q12H   sodium chloride flush  3 mL Intravenous Q12H   sodium chloride flush  3 mL Intravenous Q12H   Continuous Infusions:  cefTRIAXone (ROCEPHIN)  IV 1 g (02/04/22 0700)   PRN Meds:.acetaminophen **OR** acetaminophen, fluticasone, menthol-cetylpyridinium, sodium chloride, traMADol   I have personally reviewed following labs and imaging studies  LABORATORY DATA: CBC: Recent Labs  Lab 02/02/22 1209 02/03/22 0630 02/04/22 0038  WBC 7.6 7.3 10.7*  HGB 9.0* 9.0* 10.2*  HCT 30.0* 29.2* 33.5*  MCV 86.2 84.4 84.8  PLT 350 347 405*    Basic Metabolic Panel: Recent Labs  Lab 02/02/22 1209 02/03/22 0630 02/04/22 0038  NA 134* 140 141  K 2.9* 2.9* 2.9*  CL 94* 94* 95*  CO2 _0 GLUCOSE 93 84 155*  BUN 111* 98* 76*  CREATININE 2.00* 1.82* 1.54*  CALCIUM 6.8* 6.6* 7.2*  MG  --  2.0  --     GFR: Estimated Creatinine Clearance: 32.7 mL/min (A) (by C-G formula based on SCr of 1.54 mg/dL (H)).  Liver Function Tests: Recent Labs  Lab 02/03/22 0630  AST 10*  ALT 9  ALKPHOS 103  BILITOT 0.6  PROT 6.4*  ALBUMIN 3.0*   No results for input(s): "LIPASE", "AMYLASE" in the last 168 hours. No results for input(s): "AMMONIA" in  the last 168 hours.  Coagulation Profile: No results for input(s): "INR", "PROTIME" in the last 168 hours.  Cardiac Enzymes: No results for input(s): "CKTOTAL", "CKMB", "CKMBINDEX", "TROPONINI" in the last 168 hours.  BNP (last 3 results) No results for input(s): "PROBNP" in the last 8760 hours.  Lipid Profile: No results for input(s): "CHOL", "HDL", "LDLCALC", "TRIG", "CHOLHDL", "LDLDIRECT" in the last 72 hours.  Thyroid Function Tests: No results for input(s): "TSH", "T4TOTAL", "FREET4", "T3FREE", "THYROIDAB" in the last 72 hours.  Anemia Panel: No results for input(s): "VITAMINB12", "FOLATE", "FERRITIN", "TIBC", "IRON", "RETICCTPCT" in the last 72 hours.  Urine analysis:    Component Value Date/Time   COLORURINE YELLOW 02/02/2022 1245   APPEARANCEUR HAZY (A) 02/02/2022 1245   LABSPEC 1.009 02/02/2022 1245   LABSPEC 1.025  06/28/2013 1321   PHURINE 5.0 02/02/2022 1245   GLUCOSEU NEGATIVE 02/02/2022 1245   GLUCOSEU Negative 06/28/2013 1321   HGBUR NEGATIVE 02/02/2022 1245   BILIRUBINUR NEGATIVE 02/02/2022 1245   BILIRUBINUR Negative 06/28/2013 1321   KETONESUR NEGATIVE 02/02/2022 1245   PROTEINUR NEGATIVE 02/02/2022 1245   UROBILINOGEN 1.0 01/04/2014 2156   UROBILINOGEN 0.2 06/28/2013 1321   NITRITE NEGATIVE 02/02/2022 1245   LEUKOCYTESUR LARGE (A) 02/02/2022 1245   LEUKOCYTESUR Negative 06/28/2013 1321    Sepsis Labs: Lactic Acid, Venous    Component Value Date/Time   LATICACIDVEN 0.6 10/21/2020 0255    MICROBIOLOGY: Recent Results (from the past 240 hour(s))  Urine Culture     Status: Abnormal   Collection Time: 02/03/22  4:58 AM   Specimen: Urine, Clean Catch  Result Value Ref Range Status   Specimen Description URINE, CLEAN CATCH  Final   Special Requests NONE  Final   Culture (A)  Final    <10,000 COLONIES/mL INSIGNIFICANT GROWTH Performed at [facility], [place] [address], [place], [place] [address]    Report Status 02/04/2022 FINAL  Final  Resp  panel by RT-PCR (RSV, Flu A&B, Covid) Urine, Clean Catch     Status: None   Collection Time: 02/03/22  8:04 AM   Specimen: Urine, Clean Catch; Nasal Swab  Result Value Ref Range Status   SARS Coronavirus 2 by RT PCR NEGATIVE NEGATIVE Final    Comment: (NOTE) SARS-CoV-2 target nucleic acids are NOT DETECTED.  The SARS-CoV-2 RNA is generally detectable in upper respiratory specimens during the acute phase of infection. The lowest concentration of SARS-CoV-2 viral copies this assay can detect is 138 copies/mL. A negative result does not preclude SARS-Cov-2 infection and should not be used as the sole basis for treatment or other patient management decisions. A negative result may occur with  improper specimen collection/handling, submission of specimen other than nasopharyngeal swab, presence of viral mutation(s) within the areas targeted by this assay, and inadequate number of viral copies(<138 copies/mL). A negative result must be combined with clinical observations, patient history, and epidemiological information. The expected result is Negative.  Fact Sheet for Patients:  EntrepreneurPulse.com.au  Fact Sheet for Healthcare Providers:  IncredibleEmployment.be  This test is no t yet approved or cleared by the [place] FDA and  has been authorized for detection and/or diagnosis of SARS-CoV-2 by FDA under an Emergency Use Authorization (EUA). This EUA will remain  in effect (meaning this test can be used) for the duration of the COVID-19 declaration under Section 564(b)(1) of the Act, 21 U.S.C.section 360bbb-3(b)(1), unless the authorization is terminated  or revoked sooner.       Influenza A by PCR NEGATIVE NEGATIVE Final   Influenza B by PCR NEGATIVE NEGATIVE Final    Comment: (NOTE) The Xpert Xpress SARS-CoV-2/FLU/RSV plus assay is intended as an aid in the diagnosis of influenza from Nasopharyngeal swab specimens and should not be  used as a sole basis for treatment. Nasal washings and aspirates are unacceptable for Xpert Xpress SARS-CoV-2/FLU/RSV testing.  Fact Sheet for Patients: EntrepreneurPulse.com.au  Fact Sheet for Healthcare Providers: IncredibleEmployment.be  This test is not yet approved or cleared by the [place] FDA and has been authorized for detection and/or diagnosis of SARS-CoV-2 by FDA under an Emergency Use Authorization (EUA). This EUA will remain in effect (meaning this test can be used) for the duration of the COVID-19 declaration under Section 564(b)(1) of the Act, 21 U.S.C. section 360bbb-3(b)(1), unless the authorization is  terminated or revoked.     Resp Syncytial Virus by PCR NEGATIVE NEGATIVE Final    Comment: (NOTE) Fact Sheet for Patients: EntrepreneurPulse.com.au  Fact Sheet for Healthcare Providers: IncredibleEmployment.be  This test is not yet approved or cleared by the [place] FDA and has been authorized for detection and/or diagnosis of SARS-CoV-2 by FDA under an Emergency Use Authorization (EUA). This EUA will remain in effect (meaning this test can be used) for the duration of the COVID-19 declaration under Section 564(b)(1) of the Act, 21 U.S.C. section 360bbb-3(b)(1), unless the authorization is terminated or revoked.  Performed at [facility], [place] [address] [place], [place] [address]     RADIOLOGY STUDIES/RESULTS: ECHOCARDIOGRAM COMPLETE  Result Date: 02/03/2022    ECHOCARDIOGRAM REPORT   Patient Name:   Ryan [name] Date of Exam: 02/03/2022 Medical Rec #:  [id number]       Height:       65.0 in Accession #:    [id number]      Weight:       187.0 lb Date of Birth:  [birth year]      BSA:          1.922 m Patient Age:    30 years        BP:           94/54 mmHg Patient Gender: M               HR:           89 bpm. Exam Location:  Inpatient Procedure: 2D Echo, Cardiac Doppler  and Color Doppler Indications:    CHF-Acute Diastolic W10.93  History:        Patient has prior history of Echocardiogram examinations, most                 recent 08/17/2021. CHF, Stroke and COPD; Risk                 Factors:Hypertension, Sleep Apnea, Diabetes and Dyslipidemia.                 CKD stage III.  Sonographer:    Ronny Flurry Referring Phys: [id number] RONDELL A SMITH IMPRESSIONS  1. Left ventricular ejection fraction, by estimation, is 65 to 70%. The left ventricle has normal function. The left ventricle has no regional wall motion abnormalities. Left ventricular diastolic parameters are consistent with Grade II diastolic dysfunction (pseudonormalization). Elevated left atrial pressure.  2. Right ventricular systolic function was not well visualized. The right ventricular size is not well visualized. Tricuspid regurgitation signal is inadequate for assessing PA pressure.  3. Left atrial size was mildly dilated.  4. The mitral valve is grossly normal. No evidence of mitral valve regurgitation.  5. The aortic valve is tricuspid. Aortic valve regurgitation is not visualized. Aortic valve sclerosis is present, with no evidence of aortic valve stenosis.  6. The inferior vena cava is normal in size with greater than 50% respiratory variability, suggesting right atrial pressure of 3 mmHg.  7. Cannot exclude a small PFO. Comparison(s): No significant change from prior study. FINDINGS  Left Ventricle: Left ventricular ejection fraction, by estimation, is 65 to 70%. The left ventricle has normal function. The left ventricle has no regional wall motion abnormalities. The left ventricular internal cavity size was small. Suboptimal image quality limits for assessment of left ventricular hypertrophy. Left ventricular diastolic parameters are consistent with Grade II diastolic dysfunction (pseudonormalization). Elevated left atrial pressure. Right Ventricle: The right ventricular  size is not well visualized. Right  vetricular wall thickness was not well visualized. Right ventricular systolic function was not well visualized. Tricuspid regurgitation signal is inadequate for assessing PA pressure. Left Atrium: Left atrial size was mildly dilated. Right Atrium: Right atrial size was normal in size. Pericardium: There is no evidence of pericardial effusion. Mitral Valve: The mitral valve is grossly normal. Mild to moderate mitral annular calcification. No evidence of mitral valve regurgitation. Tricuspid Valve: The tricuspid valve is normal in structure. Tricuspid valve regurgitation is not demonstrated. No evidence of tricuspid stenosis. Aortic Valve: The aortic valve is tricuspid. There is mild aortic valve annular calcification. Aortic valve regurgitation is not visualized. Aortic valve sclerosis is present, with no evidence of aortic valve stenosis. Aortic valve mean gradient measures  7.0 mmHg. Aortic valve peak gradient measures 12.1 mmHg. Aortic valve area, by VTI measures 2.50 cm. Pulmonic Valve: The pulmonic valve was not well visualized. Pulmonic valve regurgitation is not visualized. Aorta: The aortic root and ascending aorta are structurally normal, with no evidence of dilitation. Venous: The inferior vena cava is normal in size with greater than 50% respiratory variability, suggesting right atrial pressure of 3 mmHg. IAS/Shunts: Cannot exclude a small PFO.  LEFT VENTRICLE PLAX 2D LVOT diam:     2.00 cm   Diastology LV SV:         77        LV e' medial:    6.20 cm/s LV SV Index:   40        LV E/e' medial:  19.8 LVOT Area:     3.14 cm  LV e' lateral:   8.59 cm/s                          LV E/e' lateral: 14.3  RIGHT VENTRICLE RV S prime:     17.30 cm/s TAPSE (M-mode): 1.5 cm LEFT ATRIUM             Index        RIGHT ATRIUM           Index LA Vol (A2C):   64.3 ml 33.45 ml/m  RA Area:     11.00 cm LA Vol (A4C):   50.3 ml 26.16 ml/m  RA Volume:   17.10 ml  8.89 ml/m LA Biplane Vol: 58.8 ml 30.59 ml/m  AORTIC VALVE  AV Area (Vmax):    2.65 cm AV Area (Vmean):   2.57 cm AV Area (VTI):     2.50 cm AV Vmax:           174.00 cm/s AV Vmean:          119.000 cm/s AV VTI:            0.309 m AV Peak Grad:      12.1 mmHg AV Mean Grad:      7.0 mmHg LVOT Vmax:         146.50 cm/s LVOT Vmean:        97.400 cm/s LVOT VTI:          0.246 m LVOT/AV VTI ratio: 0.80  AORTA Ao Root diam: 3.50 cm Ao Asc diam:  2.80 cm MITRAL VALVE MV Area (PHT): 3.65 cm     SHUNTS MV Decel Time: 208 msec     Systemic VTI:  0.25 m MV E velocity: 123.00 cm/s  Systemic Diam: 2.00 cm MV A velocity: 104.00 cm/s MV E/A ratio:  1.18 Mahesh Chandrasekhar  MD Electronically signed by Rudean Haskell MD Signature Date/Time: 02/03/2022/1:24:10 PM    Final    DG Chest 2 View  Result Date: 02/02/2022 CLINICAL DATA:  Shortness of breath EXAM: CHEST - 2 VIEW COMPARISON:  CXR 01/21/22 FINDINGS: No pleural effusion. No pneumothorax. Persistent large hiatal hernia. Loop recorder device in place. There are prominent bilateral interstitial opacities, nonspecific, possibly related to pulmonary venous congestion or atypical infection. Left basilar atelectasis. Visualized upper abdomen is notable for gas distended loops of bowel. Possible mild height loss of the L2 vertebral body, but this is incompletely assessed. There is a metallic density projecting over the T10 vertebral body level, this is nonspecific, and new compared to 2021. IMPRESSION: 1. Prominent bilateral interstitial opacities, nonspecific, possibly related to pulmonary venous congestion or atypical infection. 2. Possible mild height loss of the L2 vertebral body, but this is incompletely assessed. Consider dedicated imaging of the lumbar spine. 3. Metallic density projecting over the T10 vertebral body level is nonspecific and may be external. Correlate with physical exam. Electronically Signed   By: Marin Roberts M.D.   On: 02/02/2022 13:15     LOS: 1 day   Oren Binet, MD  [facility]    To contact the attending provider between 7A-7P or the covering provider during after hours 7P-7A, please log into the web site [email or web address] and access using universal [place] password for that web site. If you do not have the password, please call the hospital operator.  02/04/2022, 10:25 AM

## 2022-02-04 NOTE — TOC Initial Note (Signed)
Transition of Care [facility]) - Initial/Assessment Note    Patient Details  Name: Ryan [name] MRN: [id number] Date of Birth: Jun 16, 1931  Transition of Care [facility]) CM/SW Contact:    Erenest Rasher, RN Phone Number: [phone] 02/04/2022, 3:08 PM  Clinical Narrative:                 HF TOC CM spoke to pt and lives at [place], lives at home alone. States the facility will provide transportation to appts. Pt states he has scale but unable to read. He will have someone to read.   [place] agency at facility for PT/OT  [facility] fax # [phone], contact Rollene Fare   Expected Discharge Plan: Home/Self Care Barriers to Discharge: Continued Medical Work up   Patient Goals and CMS Choice Patient states their goals for this hospitalization and ongoing recovery are:: wants to feel better CMS Medicare.gov Compare Post Acute Care list provided to:: Patient        Expected Discharge Plan and Services   Discharge Planning Services: CM Consult   Living arrangements for the past 2 months: [place]                                      Prior Living Arrangements/Services Living arrangements for the past 2 months: [place] Lives with:: Self Patient language and need for interpreter reviewed:: Yes Do you feel safe going back to the place where you live?: Yes      Need for Family Participation in Patient Care: No (Comment) Care giver support system in place?: Yes (comment) Current home services: DME (scooter, scale, rolling walker) Criminal Activity/Legal Involvement Pertinent to Current Situation/Hospitalization: No - Comment as needed  Activities of Daily Living      Permission Sought/Granted Permission sought to share information with : Case Manager, Family Supports, PCP Permission granted to share information with : Yes, Verbal Permission Granted  Share Information with NAME: Leng Montesdeoca     Permission granted to share info w  Relationship: wife  Permission granted to share info w Contact Information: [phone]  Emotional Assessment Appearance:: Appears stated age Attitude/Demeanor/Rapport: Engaged Affect (typically observed): Accepting Orientation: : Oriented to Self, Oriented to Place, Oriented to  Time, Oriented to Situation   Psych Involvement: No (comment)  Admission diagnosis:  Acute on chronic diastolic congestive heart failure (HCC) [I50.33] Urinary tract infection without hematuria, site unspecified [N39.0] Acute on chronic diastolic (congestive) heart failure (HCC) [I50.33] Patient Active Problem List   Diagnosis Date Noted   Acute on chronic diastolic (congestive) heart failure ([place]) 02/03/2022   Acute on chronic respiratory failure with hypoxia ([place]) 02/03/2022   Urinary tract infection 02/03/2022   Cellulitis of leg 02/03/2022   Chronic hypotension 02/03/2022   Paroxysmal atrial fibrillation ([place]) 02/03/2022   History of non-Hodgkin's lymphoma 02/03/2022   Hypochromic anemia    GI bleed 10/17/2020   B12 deficiency 05/28/2019   Cerebrovascular accident (CVA) due to embolism of precerebral artery ([place]) 05/28/2019   Diabetic polyneuropathy associated with type 2 diabetes mellitus ([place]) 05/28/2019   Acute CVA (cerebrovascular accident) ([place]) 04/24/2019   Diabetes mellitus type 2 in nonobese ([place]) 04/24/2019   Acute respiratory failure with hypoxia and hypercarbia ([place]) 09/20/2018   CKD (chronic kidney disease) stage 3, GFR 30-59 ml/min ([place]) 09/20/2018   Lethargy 76/28/3151   Acute metabolic encephalopathy 76/16/0737   Chronic diastolic CHF (congestive heart  failure) ([place]) 09/20/2018   Acute kidney injury superimposed on chronic kidney disease ([place]) 09/10/2018   Pulmonary hypertension, unspecified ([place]) 09/10/2018   Multifocal atrial tachycardia 09/10/2018   CHF exacerbation ([place]) 09/07/2018   Leg pain 09/07/2018   Hyperkalemia 07/26/2018   Bilateral lower extremity edema 02/02/2017    Pulmonary fibrosis ([place]) 11/22/2016   Blurry vision, bilateral 10/26/2016   COPD ([place]) 07/05/2016   Exertional dyspnea 09/10/2015   Fatigue/malaise 08/19/2015   Asthma/COPD 02/12/2015   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   Chronic diastolic heart failure ([place]) 01/06/2014   Hypoxemia 01/05/2014   Essential hypertension 01/05/2014   Hypokalemia 01/05/2014   Urinary retention 01/05/2014   Hypoxia    Lumbar radiculopathy 12/31/2013   RLS (restless legs syndrome) 09/21/2012   Chronic bilateral low back pain with bilateral sciatica 06/15/2012   Abdominal pain, unspecified site 05/19/2012   Unspecified deficiency anemia 04/07/2012   Melanoma ([place])    Non Hodgkin's lymphoma ([place])    Periaortic lymphadenopathy 02/16/2011   Barrett's esophagus 07/09/2010   Personal history of colonic polyps 05/28/2010   History of IBS 05/28/2010   Diverticulosis of colon (without mention of hemorrhage) 05/28/2010   Arthritis involving multiple sites 05/28/2010   Wheat intolerance 05/28/2010   Chronic venous insufficiency 08/19/2009   Peripheral edema 08/19/2009   EUSTACHIAN TUBE DYSFUNCTION, BILATERAL 12/23/2008   ERECTILE DYSFUNCTION, ORGANIC 08/01/2008   Allergic rhinitis with a nonallergic component 05/20/2008   VENTRAL HERNIA 02/12/2008   PALPITATIONS 12/25/2007   LACTOSE INTOLERANCE 10/17/2007   Hyperlipidemia 09/14/2006   BPH (benign prostatic hyperplasia) 09/14/2006   OSTEOARTHRITIS 09/14/2006   HEART MURMUR, HX OF 09/14/2006   PCP:  Roetta Sessions, NP Pharmacy:   [facility], [place] [address] Phone: [phone] Fax: [place], [place] - [address] [place] [address] Phone: [phone] Fax: [phone]  [facility], [place] [address] [place]  [address] Phone: [phone] Fax: [phone]  [place], [place] [address] Phone: [phone] Fax: [phone]     Social Determinants of Health (SDOH) Social History: SDOH Screenings   Tobacco Use: Medium Risk (02/02/2022)   SDOH Interventions:     Readmission Risk Interventions     No data to display

## 2022-02-04 NOTE — Progress Notes (Signed)
   02/04/22 1000  Mobility  Activity Ambulated with assistance in hallway  Level of Assistance Contact guard assist, steadying assist  Assistive Device Front wheel walker  Distance Ambulated (ft) 60 ft  Activity Response Tolerated well  Mobility Referral Yes  $Mobility charge 1 Mobility   Mobility Specialist Progress Note  Pre-Mobility: 98 HR During Mobility: 112-147 HR Post-Mobility: 111 HR  Pt was in bed and agreeable. Had no c/o leg pain throughout ambulation. Returned to chair w/ all needs met and call bell in reach.  Lucious Groves Mobility Specialist  Please contact via SecureChat or Rehab office at [phone]

## 2022-02-04 NOTE — H&P (View-Only) (Signed)
Advanced Heart Failure Rounding Note  PCP-Cardiologist: [name], MD   Subjective:    Diuresed well with 80 IV lasix BID. -3.6 L UOP, down 7lbs  Feels fine this morning. Complaining of RLS, dry mouth and nose. Denis CP. Breathing stable.    Objective:   Weight Range: 82 kg Body mass index is 30.08 kg/m.   Vital Signs:   Temp:  [97.6 F (36.4 C)-98 F (36.7 C)] 97.8 F (36.6 C) (01/04 0722) Pulse Rate:  [75-94] 87 (01/04 0722) Resp:  [14-27] 18 (01/04 0722) BP: (94-129)/(47-69) 104/62 (01/04 0722) SpO2:  [92 %-100 %] 97 % (01/04 0722) Weight:  [82 kg] 82 kg (01/03 2333) Last BM Date : 02/03/22  Weight change: Filed Weights   02/03/22 0543 02/03/22 2333  Weight: 84.8 kg 82 kg    Intake/Output:   Intake/Output Summary (Last 24 hours) at 02/04/2022 0804 Last data filed at 02/04/2022 0533 Gross per 24 hour  Intake 480 ml  Output 3400 ml  Net -2920 ml      Physical Exam    General:  elderly appearing.  No respiratory difficulty HEENT: normal Neck: supple. JVD ~14. Carotids 2+ bilat; no bruits. No lymphadenopathy or thyromegaly appreciated. Cor: PMI nondisplaced. Tachy rate & regular rhythm. No rubs, gallops or murmurs. Lungs: clear, tachypnea  Abdomen: soft, nontender, nondistended. No hepatosplenomegaly. No bruits or masses. Good bowel sounds. Extremities: no cyanosis, clubbing, rash, +2 BLE edema to knees Neuro: alert & oriented x 3, cranial nerves grossly intact. moves all 4 extremities w/o difficulty. Affect pleasant.    Telemetry   ST low 100s 2-4 PVCs (Personally reviewed)    EKG    No new EKG to review  Labs    CBC Recent Labs    02/03/22 0630 02/04/22 0038  WBC 7.3 10.7*  HGB 9.0* 10.2*  HCT 29.2* 33.5*  MCV 84.4 84.8  PLT 347 625*   Basic Metabolic Panel Recent Labs    02/03/22 0630 02/04/22 0038  NA 140 141  K 2.9* 2.9*  CL 94* 95*  CO2 30 31  GLUCOSE 84 155*  BUN 98* 76*  CREATININE 1.82* 1.54*  CALCIUM 6.6* 7.2*   MG 2.0  --    Liver Function Tests Recent Labs    02/03/22 0630  AST 10*  ALT 9  ALKPHOS 103  BILITOT 0.6  PROT 6.4*  ALBUMIN 3.0*   No results for input(s): "LIPASE", "AMYLASE" in the last 72 hours. Cardiac Enzymes No results for input(s): "CKTOTAL", "CKMB", "CKMBINDEX", "TROPONINI" in the last 72 hours.  BNP: BNP (last 3 results) Recent Labs    07/28/21 1439 11/05/21 1229 02/02/22 1209  BNP 40.7 61.0 187.7*    ProBNP (last 3 results) No results for input(s): "PROBNP" in the last 8760 hours.   D-Dimer No results for input(s): "DDIMER" in the last 72 hours. Hemoglobin A1C No results for input(s): "HGBA1C" in the last 72 hours. Fasting Lipid Panel No results for input(s): "CHOL", "HDL", "LDLCALC", "TRIG", "CHOLHDL", "LDLDIRECT" in the last 72 hours. Thyroid Function Tests No results for input(s): "TSH", "T4TOTAL", "T3FREE", "THYROIDAB" in the last 72 hours.  Invalid input(s): "FREET3"  Other results:   Imaging    ECHOCARDIOGRAM COMPLETE  Result Date: 02/03/2022    ECHOCARDIOGRAM REPORT   Patient Name:   Ryan [name] Date of Exam: 02/03/2022 Medical Rec #:  [id number]       Height:       65.0 in Accession #:    [id number]  Weight:       187.0 lb Date of Birth:  [birth year]      BSA:          1.922 m Patient Age:    89 years        BP:           94/54 mmHg Patient Gender: M               HR:           89 bpm. Exam Location:  Inpatient Procedure: 2D Echo, Cardiac Doppler and Color Doppler Indications:    CHF-Acute Diastolic A07.62  History:        Patient has prior history of Echocardiogram examinations, most                 recent 08/17/2021. CHF, Stroke and COPD; Risk                 Factors:Hypertension, Sleep Apnea, Diabetes and Dyslipidemia.                 CKD stage III.  Sonographer:    Ronny Flurry Referring Phys: [id number] RONDELL A SMITH IMPRESSIONS  1. Left ventricular ejection fraction, by estimation, is 65 to 70%. The left ventricle has normal  function. The left ventricle has no regional wall motion abnormalities. Left ventricular diastolic parameters are consistent with Grade II diastolic dysfunction (pseudonormalization). Elevated left atrial pressure.  2. Right ventricular systolic function was not well visualized. The right ventricular size is not well visualized. Tricuspid regurgitation signal is inadequate for assessing PA pressure.  3. Left atrial size was mildly dilated.  4. The mitral valve is grossly normal. No evidence of mitral valve regurgitation.  5. The aortic valve is tricuspid. Aortic valve regurgitation is not visualized. Aortic valve sclerosis is present, with no evidence of aortic valve stenosis.  6. The inferior vena cava is normal in size with greater than 50% respiratory variability, suggesting right atrial pressure of 3 mmHg.  7. Cannot exclude a small PFO. Comparison(s): No significant change from prior study. FINDINGS  Left Ventricle: Left ventricular ejection fraction, by estimation, is 65 to 70%. The left ventricle has normal function. The left ventricle has no regional wall motion abnormalities. The left ventricular internal cavity size was small. Suboptimal image quality limits for assessment of left ventricular hypertrophy. Left ventricular diastolic parameters are consistent with Grade II diastolic dysfunction (pseudonormalization). Elevated left atrial pressure. Right Ventricle: The right ventricular size is not well visualized. Right vetricular wall thickness was not well visualized. Right ventricular systolic function was not well visualized. Tricuspid regurgitation signal is inadequate for assessing PA pressure. Left Atrium: Left atrial size was mildly dilated. Right Atrium: Right atrial size was normal in size. Pericardium: There is no evidence of pericardial effusion. Mitral Valve: The mitral valve is grossly normal. Mild to moderate mitral annular calcification. No evidence of mitral valve regurgitation. Tricuspid  Valve: The tricuspid valve is normal in structure. Tricuspid valve regurgitation is not demonstrated. No evidence of tricuspid stenosis. Aortic Valve: The aortic valve is tricuspid. There is mild aortic valve annular calcification. Aortic valve regurgitation is not visualized. Aortic valve sclerosis is present, with no evidence of aortic valve stenosis. Aortic valve mean gradient measures  7.0 mmHg. Aortic valve peak gradient measures 12.1 mmHg. Aortic valve area, by VTI measures 2.50 cm. Pulmonic Valve: The pulmonic valve was not well visualized. Pulmonic valve regurgitation is not visualized. Aorta: The aortic root and ascending aorta are structurally  normal, with no evidence of dilitation. Venous: The inferior vena cava is normal in size with greater than 50% respiratory variability, suggesting right atrial pressure of 3 mmHg. IAS/Shunts: Cannot exclude a small PFO.  LEFT VENTRICLE PLAX 2D LVOT diam:     2.00 cm   Diastology LV SV:         77        LV e' medial:    6.20 cm/s LV SV Index:   40        LV E/e' medial:  19.8 LVOT Area:     3.14 cm  LV e' lateral:   8.59 cm/s                          LV E/e' lateral: 14.3  RIGHT VENTRICLE RV S prime:     17.30 cm/s TAPSE (M-mode): 1.5 cm LEFT ATRIUM             Index        RIGHT ATRIUM           Index LA Vol (A2C):   64.3 ml 33.45 ml/m  RA Area:     11.00 cm LA Vol (A4C):   50.3 ml 26.16 ml/m  RA Volume:   17.10 ml  8.89 ml/m LA Biplane Vol: 58.8 ml 30.59 ml/m  AORTIC VALVE AV Area (Vmax):    2.65 cm AV Area (Vmean):   2.57 cm AV Area (VTI):     2.50 cm AV Vmax:           174.00 cm/s AV Vmean:          119.000 cm/s AV VTI:            0.309 m AV Peak Grad:      12.1 mmHg AV Mean Grad:      7.0 mmHg LVOT Vmax:         146.50 cm/s LVOT Vmean:        97.400 cm/s LVOT VTI:          0.246 m LVOT/AV VTI ratio: 0.80  AORTA Ao Root diam: 3.50 cm Ao Asc diam:  2.80 cm MITRAL VALVE MV Area (PHT): 3.65 cm     SHUNTS MV Decel Time: 208 msec     Systemic VTI:  0.25 m  MV E velocity: 123.00 cm/s  Systemic Diam: 2.00 cm MV A velocity: 104.00 cm/s MV E/A ratio:  1.18 Rudean Haskell MD Electronically signed by Rudean Haskell MD Signature Date/Time: 02/03/2022/1:24:10 PM    Final      Medications:     Scheduled Medications:  apixaban  2.5 mg Oral BID   atorvastatin  40 mg Oral q1800   escitalopram  10 mg Oral Daily   ferrous sulfate  325 mg Oral Daily   finasteride  5 mg Oral Daily   furosemide  80 mg Intravenous BID   gabapentin  100 mg Oral QHS   midodrine  10 mg Oral TID WC   potassium chloride SA  60 mEq Oral Daily   Riociguat  2.5 mg Oral TID   rOPINIRole  2 mg Oral TID   Selexipag  1,600 mcg Oral Q12H   And   Selexipag  400 mcg Oral Q12H   sodium chloride flush  3 mL Intravenous Q12H    Infusions:  cefTRIAXone (ROCEPHIN)  IV 1 g (02/04/22 0700)    PRN Medications: acetaminophen **OR** acetaminophen, fluticasone, sodium chloride, traMADol  Patient Profile   89 y/o male w/ chronic diastolic heart failure w/ prominent RV dysfunction in setting of pulmonary HTN, PAF, h/o CVA, OSA intolerant of CPAP, ILD and carotid artery stenosis, admitted w/ acute on chronic RV failure and UTI.    Assessment/Plan   1. Acute on Chronic diastolic CHF: Suspect with significant component of RV failure from pulmonary hypertension.  Echo in 6/20 with EF 60-65%, RV not well-visualized but PASP 59 mmHg.  Echo (3/21) with EF 60-65%, severe LVH, normal RV, PASP 28 mmHg.  PYP scan was not suggestive of TTR cardiac amyloidosis. Echo in 6/22 showed EF 65-70% with mild LVH, normal RV, IVC normal, PASP estimated 29 mmHg.  Markedly volume overloaded in the setting of poor compliance w/ home diuretic regimen. NYHA Class IIIb-IV.  - Echo 02/03/22: EF 65-70% with D-shaped septum, mild RV dilation/dysfunction, dilated IVC.   - Continue IV Lasix 80 mg bid w/ aggressive K supp  - Unable to wear compression stockings/UNNA boots or elevate legs due to RLS.  - Continue  midodrine 10 mg tid to promote higher BP in setting of RV failure. - Continue Toprol XL 12.5 mg qhs. - Continue Riociquat 2.5 mg tid 2. AKI on CKD Stage 3b: B/l Scr ~1.7. Scr 2.1 on admit. UA + for UTI  - monitor closely w/ diuresis, down to 1.54 today - abx per IM  3. Pulmonary hypertension: Patient thought to have [place] out of proportion to his lung disease (ILD, NSIP versus UIP). RHC in 2/20 showed mean PA pressure 41, PVR 5.1 WU, CI 3. Echo in 6/20 with RV reportedly ok => I reviewed and do not think that RV was well-visualized.  Patient did not tolerate Adcirca due to edema/?cognitive effects.  RHC was done again in 6/20, showed moderate pulmonary arterial hypertension with PVR 5.35 (comparable to prior).  Serological workup for PH was negative. Patient started ambrisentan 5 mg and developed significant edema/volume overload despite a high dose of torsemide. He is now on Uptravi + riociguat. Echo 7/23 showed normal RV size/systolic function and PA systolic pressure estimate was normal.   - He will stay off ambrisentan (? trigger for volume overload).  - As above, he did not tolerate tadalafil.  - He is at 2000 mcg bid Uptravi.  With excellent echo in 7/23, will keep at this dose.   - Continue riociguat at goal dose 2.5 mg tid.  - updated echo as above  4. Atrial fibrillation: Paroxysmal, noted on ILR. NSR on admission. - Continue apixaban 2.5 mg bid (dosed for age, renal dysfunction).  - Toprol XL 12.5 mg daily  5. OSA: Cannot tolerate CPAP.  6. CVA: In 3/21, may have been atrial fibrillation-related.  On apixaban now.  7. Carotid stenosis: Carotid duplex 04/23: 40-59% right ICA stenosis, no significant stenosis on the left - Continue statin. - Repeat study 4/24  8. Interstitial lung disease:  Has seen pulmonary, ILD seems mild and stable.  9. UTI: UA +. UCx pending.  - on IV rocephin per IM  10. Hypokalemia: 2.9 on admit - aggressive K supp w/ diuresis, 2.9 again today - Mg 2   Length  of Stay: [place], NP  02/04/2022, 8:04 AM  Advanced Heart Failure Team Pager [phone] (M-F; 7a - 5p)  Please contact [facility] for night-coverage after hours (5p -7a ) and weekends on [email or web address]  Patient seen with NP, agree with the above note. He diuresed well yesterday, weight down 7 lbs.  Creatinine down to 1.54.  He is getting Lasix 80 mg IV bid.   General: NAD Neck: JVP 12-14 cm, no thyromegaly or thyroid nodule.  Lungs: Clear to auscultation bilaterally with normal respiratory effort. CV: Nondisplaced PMI.  Heart regular S1/S2, no S3/S4, no murmur.  1+ edema to knees.  Abdomen: Soft, nontender, no hepatosplenomegaly, no distention.  Skin: Intact without lesions or rashes.  Neurologic: Alert and oriented x 3.  Psych: Normal affect. Extremities: No clubbing or cyanosis.  HEENT: Normal.   Patient returns with volume overload/RV failure.  May not have been receiving all his medications prior to admission. Volume overloaded on exam with creatinine trending down with diuresis.   - Lasix 80 mg IV bid, good response so far.  Continue today.   - Will arrange for RHC after further diuresis tomorrow.  Want to reassess PA pressure.  For now, he will continue his current PH meds with Adempas and selexipag. Discussed risks/benefits and patient agrees to procedure.    UA shows UTI, he is getting ceftriaxone.   Mobilize.  Loralie Champagne 02/04/2022 9:48 AM

## 2022-02-05 ENCOUNTER — Encounter (HOSPITAL_COMMUNITY)
Admission: EM | Disposition: A | Discharge status: Home / Self Care | Payer: Self-pay | Source: Home / Self Care | Attending: Internal Medicine

## 2022-02-05 ENCOUNTER — Encounter (HOSPITAL_COMMUNITY): Payer: Self-pay | Admitting: Cardiology

## 2022-02-05 DIAGNOSIS — J9621 Acute and chronic respiratory failure with hypoxia: Secondary | ICD-10-CM | POA: Diagnosis not present

## 2022-02-05 DIAGNOSIS — I5033 Acute on chronic diastolic (congestive) heart failure: Secondary | ICD-10-CM | POA: Diagnosis not present

## 2022-02-05 DIAGNOSIS — N390 Urinary tract infection, site not specified: Secondary | ICD-10-CM

## 2022-02-05 DIAGNOSIS — L03119 Cellulitis of unspecified part of limb: Secondary | ICD-10-CM | POA: Diagnosis not present

## 2022-02-05 HISTORY — PX: RIGHT HEART CATH: CATH118263

## 2022-02-05 LAB — BASIC METABOLIC PANEL
Anion gap: 12 (ref 5–15)
BUN: 57 mg/dL — ABNORMAL HIGH (ref 8–23)
CO2: 30 mmol/L (ref 22–32)
Calcium: 7.2 mg/dL — ABNORMAL LOW (ref 8.9–10.3)
Chloride: 100 mmol/L (ref 98–111)
Creatinine, Ser: 1.36 mg/dL — ABNORMAL HIGH (ref 0.61–1.24)
GFR, Estimated: 50 mL/min — ABNORMAL LOW (ref >60)
Glucose, Bld: 108 mg/dL — ABNORMAL HIGH (ref 70–99)
Potassium: 4.5 mmol/L (ref 3.5–5.1)
Sodium: 142 mmol/L (ref 135–145)

## 2022-02-05 LAB — POCT I-STAT EG7
Acid-Base Excess: 6 mmol/L — ABNORMAL HIGH (ref 0.0–2.0)
Acid-Base Excess: 7 mmol/L — ABNORMAL HIGH (ref 0.0–2.0)
Bicarbonate: 31 mmol/L — ABNORMAL HIGH (ref 20.0–28.0)
Bicarbonate: 31.7 mmol/L — ABNORMAL HIGH (ref 20.0–28.0)
Calcium, Ion: 0.96 mmol/L — ABNORMAL LOW (ref 1.15–1.40)
Calcium, Ion: 0.98 mmol/L — ABNORMAL LOW (ref 1.15–1.40)
HCT: 29 % — ABNORMAL LOW (ref 39.0–52.0)
HCT: 30 % — ABNORMAL LOW (ref 39.0–52.0)
Hemoglobin: 10.2 g/dL — ABNORMAL LOW (ref 13.0–17.0)
Hemoglobin: 9.9 g/dL — ABNORMAL LOW (ref 13.0–17.0)
O2 Saturation: 70 %
O2 Saturation: 70 %
Potassium: 4.1 mmol/L (ref 3.5–5.1)
Potassium: 4.1 mmol/L (ref 3.5–5.1)
Sodium: 144 mmol/L (ref 135–145)
Sodium: 144 mmol/L (ref 135–145)
TCO2: 32 mmol/L (ref 22–32)
TCO2: 33 mmol/L — ABNORMAL HIGH (ref 22–32)
pCO2, Ven: 45.6 mmHg (ref 44–60)
pCO2, Ven: 45.7 mmHg (ref 44–60)
pH, Ven: 7.44 — ABNORMAL HIGH (ref 7.25–7.43)
pH, Ven: 7.45 — ABNORMAL HIGH (ref 7.25–7.43)
pO2, Ven: 35 mmHg (ref 32–45)
pO2, Ven: 36 mmHg (ref 32–45)

## 2022-02-05 SURGERY — RIGHT HEART CATH
Anesthesia: LOCAL

## 2022-02-05 MED ORDER — APIXABAN 2.5 MG PO TABS
2.5000 mg | ORAL_TABLET | Freq: Two times a day (BID) | ORAL | Status: DC
  Administered 2022-02-05 – 2022-02-08 (×7): 2.5 mg via ORAL
  Filled 2022-02-05 (×7): qty 1

## 2022-02-05 MED ORDER — ONDANSETRON HCL 4 MG/2ML IJ SOLN
4.0000 mg | Freq: Four times a day (QID) | INTRAMUSCULAR | Status: DC | PRN
  Administered 2022-02-05 – 2022-02-07 (×4): 4 mg via INTRAVENOUS
  Filled 2022-02-05 (×4): qty 2

## 2022-02-05 MED ORDER — METOPROLOL SUCCINATE 12.5 MG HALF TABLET
12.5000 mg | ORAL_TABLET | Freq: Every day | ORAL | Status: DC
  Administered 2022-02-05 – 2022-02-08 (×4): 12.5 mg via ORAL
  Filled 2022-02-05 (×4): qty 1

## 2022-02-05 MED ORDER — SODIUM CHLORIDE 0.9 % IV SOLN: 250.0000 mL | INTRAVENOUS | Status: DC | PRN

## 2022-02-05 MED ORDER — HEPARIN (PORCINE) IN NACL 1000-0.9 UT/500ML-% IV SOLN
INTRAVENOUS | Status: AC
  Filled 2022-02-05: qty 1000

## 2022-02-05 MED ORDER — ACETAMINOPHEN 325 MG PO TABS: 650.0000 mg | ORAL_TABLET | ORAL | Status: DC | PRN

## 2022-02-05 MED ORDER — SODIUM CHLORIDE 0.9 % IV SOLN: INTRAVENOUS | Status: DC

## 2022-02-05 MED ORDER — ALPRAZOLAM 0.25 MG PO TABS
0.2500 mg | ORAL_TABLET | Freq: Every evening | ORAL | Status: DC | PRN
  Administered 2022-02-06 – 2022-02-07 (×3): 0.25 mg via ORAL
  Filled 2022-02-05 (×3): qty 1

## 2022-02-05 MED ORDER — LOPERAMIDE HCL 2 MG PO CAPS
2.0000 mg | ORAL_CAPSULE | ORAL | Status: DC | PRN
  Administered 2022-02-05: 2 mg via ORAL
  Filled 2022-02-05: qty 1

## 2022-02-05 MED ORDER — MELATONIN 5 MG PO TABS
5.0000 mg | ORAL_TABLET | Freq: Every day | ORAL | Status: DC
  Administered 2022-02-05 – 2022-02-07 (×3): 5 mg via ORAL
  Filled 2022-02-05 (×3): qty 1

## 2022-02-05 MED ORDER — ASPIRIN 81 MG PO CHEW: 81.0000 mg | CHEWABLE_TABLET | ORAL | Status: DC

## 2022-02-05 MED ORDER — LABETALOL HCL 5 MG/ML IV SOLN: 10.0000 mg | INTRAVENOUS | Status: AC | PRN

## 2022-02-05 MED ORDER — SODIUM CHLORIDE 0.9% FLUSH: 3.0000 mL | INTRAVENOUS | Status: DC | PRN

## 2022-02-05 MED ORDER — SODIUM CHLORIDE 0.9% FLUSH
3.0000 mL | Freq: Two times a day (BID) | INTRAVENOUS | Status: DC
  Administered 2022-02-05 – 2022-02-08 (×7): 3 mL via INTRAVENOUS

## 2022-02-05 MED ORDER — HYDRALAZINE HCL 20 MG/ML IJ SOLN: 10.0000 mg | INTRAMUSCULAR | Status: AC | PRN

## 2022-02-05 MED ORDER — TORSEMIDE 100 MG PO TABS
100.0000 mg | ORAL_TABLET | Freq: Two times a day (BID) | ORAL | Status: DC
  Administered 2022-02-05 – 2022-02-08 (×6): 100 mg via ORAL
  Filled 2022-02-05 (×8): qty 1

## 2022-02-05 MED ORDER — ASPIRIN 81 MG PO CHEW
81.0000 mg | CHEWABLE_TABLET | ORAL | Status: AC
  Administered 2022-02-05: 81 mg via ORAL
  Filled 2022-02-05: qty 1

## 2022-02-05 MED ORDER — LIDOCAINE HCL (PF) 1 % IJ SOLN
INTRAMUSCULAR | Status: AC
  Filled 2022-02-05: qty 30

## 2022-02-05 MED ORDER — HEPARIN (PORCINE) IN NACL 1000-0.9 UT/500ML-% IV SOLN
INTRAVENOUS | Status: DC | PRN
  Administered 2022-02-05: 500 mL

## 2022-02-05 MED ORDER — LIDOCAINE HCL (PF) 1 % IJ SOLN
INTRAMUSCULAR | Status: DC | PRN
  Administered 2022-02-05: 2 mL

## 2022-02-05 SURGICAL SUPPLY — 5 items
CATH SWAN GANZ 7F STRAIGHT (CATHETERS) IMPLANT
GLIDESHEATH SLENDER 7FR .021G (SHEATH) IMPLANT
KIT HEART LEFT (KITS) ×1 IMPLANT
PACK CARDIAC CATHETERIZATION (CUSTOM PROCEDURE TRAY) ×1 IMPLANT
TRANSDUCER W/STOPCOCK (MISCELLANEOUS) ×1 IMPLANT

## 2022-02-05 NOTE — Interval H&P Note (Signed)
History and Physical Interval Note:  02/05/2022 7:44 AM  Ryan [name]  has presented today for surgery, with the diagnosis of heart failure, pulmonary hypertension.  The various methods of treatment have been discussed with the patient and family. After consideration of risks, benefits and other options for treatment, the patient has consented to  Procedure(s): RIGHT HEART CATH (N/A) as a surgical intervention.  The patient's history has been reviewed, patient examined, no change in status, stable for surgery.  I have reviewed the patient's chart and labs.  Questions were answered to the patient's satisfaction.     Ryan [name] [employer]

## 2022-02-05 NOTE — Progress Notes (Signed)
Patient ID: Ryan [name], male   DOB: 01-05-1932, 89 y.o.   MRN: [id number]     Advanced Heart Failure Rounding Note  PCP-Cardiologist: [name], MD   Subjective:    Diuresed well with 80 IV lasix BID. Weight down another 4 lbs. Creatinine trending down, 1.36 today.   No dyspnea.  Did not sleep well.   RHC Procedural Findings: Hemodynamics (mmHg) RA mean 6 RV 42/9 PA 45/22, mean 32 PCWP mean 14 Oxygen saturations: PA 70% AO 94% Cardiac Output (Fick) 7.49  Cardiac Index (Fick) 3.99 PVR 2.4 WU   Objective:   Weight Range: 80 kg Body mass index is 29.35 kg/m.   Vital Signs:   Temp:  [97.9 F (36.6 C)-98 F (36.7 C)] 98 F (36.7 C) (01/05 0450) Pulse Rate:  [88-99] 99 (01/05 0801) Resp:  [18-38] 21 (01/05 0801) BP: (97-124)/(40-71) 118/66 (01/05 0801) SpO2:  [81 %-97 %] 91 % (01/05 0801) Weight:  [80 kg] 80 kg (01/05 0122) Last BM Date : 02/04/22  Weight change: Filed Weights   02/03/22 0543 02/03/22 2333 02/05/22 0122  Weight: 84.8 kg 82 kg 80 kg    Intake/Output:   Intake/Output Summary (Last 24 hours) at 02/05/2022 0810 Last data filed at 02/05/2022 0451 Gross per 24 hour  Intake 477 ml  Output 1450 ml  Net -973 ml      Physical Exam    General: NAD Neck: No JVD, no thyromegaly or thyroid nodule.  Lungs: Clear to auscultation bilaterally with normal respiratory effort. CV: Nondisplaced PMI.  Heart regular S1/S2, no S3/S4, no murmur.  1+ ankle edema.  Abdomen: Soft, nontender, no hepatosplenomegaly, no distention.  Skin: Intact without lesions or rashes.  Neurologic: Alert and oriented x 3.  Psych: Normal affect. Extremities: No clubbing or cyanosis.  HEENT: Normal.   Telemetry   NSR with PACs and PVCs (Personally reviewed)    EKG    No new EKG to review  Labs    CBC Recent Labs    02/03/22 0630 02/04/22 0038  WBC 7.3 10.7*  HGB 9.0* 10.2*  HCT 29.2* 33.5*  MCV 84.4 84.8  PLT 347 338*   Basic Metabolic Panel Recent Labs     02/03/22 0630 02/04/22 0038 02/04/22 1418 02/05/22 0109  NA 140   < > 140 142  K 2.9*   < > 3.3* 4.5  CL 94*   < > 96* 100  CO2 30   < > 30 30  GLUCOSE 84   < > 171* 108*  BUN 98*   < > 64* 57*  CREATININE 1.82*   < > 1.50* 1.36*  CALCIUM 6.6*   < > 7.1* 7.2*  MG 2.0  --   --   --    < > = values in this interval not displayed.   Liver Function Tests Recent Labs    02/03/22 0630  AST 10*  ALT 9  ALKPHOS 103  BILITOT 0.6  PROT 6.4*  ALBUMIN 3.0*   No results for input(s): "LIPASE", "AMYLASE" in the last 72 hours. Cardiac Enzymes No results for input(s): "CKTOTAL", "CKMB", "CKMBINDEX", "TROPONINI" in the last 72 hours.  BNP: BNP (last 3 results) Recent Labs    07/28/21 1439 11/05/21 1229 02/02/22 1209  BNP 40.7 61.0 187.7*    ProBNP (last 3 results) No results for input(s): "PROBNP" in the last 8760 hours.   D-Dimer No results for input(s): "DDIMER" in the last 72 hours. Hemoglobin A1C No results for input(s): "  HGBA1C" in the last 72 hours. Fasting Lipid Panel No results for input(s): "CHOL", "HDL", "LDLCALC", "TRIG", "CHOLHDL", "LDLDIRECT" in the last 72 hours. Thyroid Function Tests No results for input(s): "TSH", "T4TOTAL", "T3FREE", "THYROIDAB" in the last 72 hours.  Invalid input(s): "FREET3"  Other results:   Imaging    CARDIAC CATHETERIZATION  Result Date: 02/05/2022 1. Filling pressures in normal range. 2. Mild pulmonary hypertension, looks like predominantly pulmonary venous hypertension. 3. Preserved cardiac output.     Medications:     Scheduled Medications:  apixaban  2.5 mg Oral BID   [MAR Hold] atorvastatin  40 mg Oral q1800   [MAR Hold] escitalopram  10 mg Oral Daily   [MAR Hold] ferrous sulfate  325 mg Oral Daily   [MAR Hold] finasteride  5 mg Oral Daily   [MAR Hold] gabapentin  100 mg Oral QHS   [MAR Hold] midodrine  10 mg Oral TID WC   [MAR Hold] potassium chloride SA  60 mEq Oral Daily   [MAR Hold] Riociguat  2.5 mg  Oral TID   [MAR Hold] rOPINIRole  2 mg Oral QID   [MAR Hold] Selexipag  1,600 mcg Oral Q12H   And   [MAR Hold] Selexipag  400 mcg Oral Q12H   [MAR Hold] sodium chloride flush  3 mL Intravenous Q12H   [MAR Hold] sodium chloride flush  3 mL Intravenous Q12H   torsemide  100 mg Oral BID    Infusions:  sodium chloride 10 mL/hr at 02/05/22 0600   [MAR Hold] cefTRIAXone (ROCEPHIN)  IV 1 g (02/05/22 0619)    PRN Medications: [MAR Hold] acetaminophen **OR** [MAR Hold] acetaminophen, [MAR Hold] fluticasone, [MAR Hold] menthol-cetylpyridinium, [MAR Hold] sodium chloride, [MAR Hold] traMADol    Patient Profile   89 y/o male w/ chronic diastolic heart failure w/ prominent RV dysfunction in setting of pulmonary HTN, PAF, h/o CVA, OSA intolerant of CPAP, ILD and carotid artery stenosis, admitted w/ acute on chronic RV failure and UTI.    Assessment/Plan   1. Acute on Chronic diastolic CHF: Suspect with significant component of RV failure from pulmonary hypertension.  Echo in 6/20 with EF 60-65%, RV not well-visualized but PASP 59 mmHg.  Echo (3/21) with EF 60-65%, severe LVH, normal RV, PASP 28 mmHg.  PYP scan was not suggestive of TTR cardiac amyloidosis. Echo in 6/22 showed EF 65-70% with mild LVH, normal RV, IVC normal, PASP estimated 29 mmHg.  Markedly volume overloaded in the setting of poor compliance w/ home diuretic regimen. NYHA Class IIIb-IV. Echo this admission with EF 65-70% with D-shaped septum, mild RV dilation/dysfunction, dilated IVC.  He has diuresed about 11 lbs with IV Lasix, and RHC today showed normal filling pressures with mild pulmonary venous hypertension.  - Transition back to torsemide 100 mg bid today.  He should continue metolazone once weekly as prior to admission.  Will use same dose of diuretics as pre-admission, think he was missing meds.  - Unable to wear compression stockings/UNNA boots or elevate legs due to RLS.  - Continue midodrine 10 mg tid to promote higher BP  in setting of RV failure. - Continue Toprol XL 12.5 mg qhs. - Continue Riociquat 2.5 mg tid 2. AKI on CKD Stage 3b: B/l Scr ~1.7. Scr 2.1 on admit. UA + for UTI.  Creatinine down to 1.36 today.  3. Pulmonary hypertension: Patient thought to have [place] out of proportion to his lung disease (ILD, NSIP versus UIP). RHC in 2/20 showed mean PA pressure 41,  PVR 5.1 WU, CI 3. Echo in 6/20 with RV reportedly ok => I reviewed and do not think that RV was well-visualized.  Patient did not tolerate Adcirca due to edema/?cognitive effects.  RHC was done again in 6/20, showed moderate pulmonary arterial hypertension with PVR 5.35 (comparable to prior).  Serological workup for PH was negative. Patient started ambrisentan 5 mg and developed significant edema/volume overload despite a high dose of torsemide. He is now on Uptravi + riociguat. Echo 7/23 showed normal RV size/systolic function and PA systolic pressure estimate was normal.  RHC today showed mild primarily pulmonary venous hypertension.  - He will stay off ambrisentan (? trigger for volume overload).  - As above, he did not tolerate tadalafil.  - He is at 2000 mcg bid Uptravi.  Will continue this dose.   - Continue riociguat at goal dose 2.5 mg tid.  - updated echo as above  4. Atrial fibrillation: Paroxysmal, noted on ILR. NSR on admission. - Continue apixaban 2.5 mg bid (dosed for age, renal dysfunction).  - Toprol XL 12.5 mg daily  5. OSA: Cannot tolerate CPAP.  6. CVA: In 3/21, may have been atrial fibrillation-related.  On apixaban now.  7. Carotid stenosis: Carotid duplex 04/23: 40-59% right ICA stenosis, no significant stenosis on the left - Continue statin. - Repeat study 4/24  8. Interstitial lung disease:  Has seen pulmonary, ILD seems mild and stable.  9. UTI: By UA.   - on IV rocephin per IM   Based on RHC, he appears to be ready from my standpoint for discharge.  He should resume his pre-admission medication regimen but needs to be  compliant with all meds.  Will make CHF clinic followup.   Length of Stay: 2  Loralie Champagne, MD  02/05/2022, 8:10 AM  Advanced Heart Failure Team Pager [phone] (M-F; 7a - 5p)  Please contact [facility] for night-coverage after hours (5p -7a ) and weekends on [email or web address]

## 2022-02-05 NOTE — Progress Notes (Signed)
   02/05/22 1000  Mobility  Activity Dangled on edge of bed  Level of Assistance Moderate assist, patient does 50-74%  Assistive Device  (HHA)  Activity Response Tolerated well  Mobility Referral Yes  $Mobility charge 1 Mobility   Mobility Specialist Progress Note  Pt requesting help to sit on the side of his bed for breakfast. Had c/o leg pain. Left EOB with all needs met and call bell in reach.   Lucious Groves Mobility Specialist  Please contact via SecureChat or Rehab office at [phone]

## 2022-02-05 NOTE — Progress Notes (Signed)
   02/05/22 1100  Mobility  Activity Ambulated independently in hallway  Level of Assistance Moderate assist, patient does 50-74%  Assistive Device Front wheel walker  Distance Ambulated (ft) 55 ft  Activity Response Tolerated fair  Mobility Referral Yes  $Mobility charge 1 Mobility   Mobility Specialist Progress Note  Pt EOB and agreeable. Had c/o leg pain throughout ambulation. Returned to chair w/ alarm on and call bell in reach.   Lucious Groves Mobility Specialist  Please contact via SecureChat or Rehab office at [phone]

## 2022-02-05 NOTE — Progress Notes (Signed)
PT Cancellation Note  Patient Details Name: Ryan [name] MRN: [id number] DOB: [birth year]   Cancelled Treatment:    Reason Eval/Treat Not Completed: Patient at procedure or test/unavailable. Will plan to follow-up later as time permits.   Moishe Spice, PT, DPT Acute Rehabilitation Services  Office: [place] 02/05/2022, 7:41 AM

## 2022-02-05 NOTE — Evaluation (Signed)
Physical Therapy Evaluation Patient Details Name: Ryan [name] MRN: [id number] DOB: 1931/03/24 Today's Date: 02/05/2022  History of Present Illness  Pt is an 89 y.o. male who presented 02/02/22 with SOB and lower extremity edema. Admitted with acute on chronic HFpEF and UTI. PMH: asthma, barrett's esophagus, CHF, COPD, DM2, HLD, HTN, non-Hodgkin's lymphoma, melanoma   Clinical Impression  Pt presents with condition above and deficits mentioned below, see PT Problem List. PTA, he was mod I for mobility using a rollator and electric scooter for longer distance mobility in the community but typically not needing an AD to ambulate household distances. He lives alone at an [place] with a level entry. Currently, pt is limited by nausea, having multiple bouts of emesis throughout the day. Thus, limited session per pt request. Pt demonstrates deficits in strength, balance, and activity tolerance but was able to transfer and ambulate in the room with a RW at a supervision level. Recommending follow-up with [facility]PT at the [facility] upon d/c. Will continue to follow acutely.       Recommendations for follow up therapy are one component of a multi-disciplinary discharge planning process, led by the attending physician.  Recommendations may be updated based on patient status, additional functional criteria and insurance authorization.  Follow Up Recommendations Home health PT (at [facility])      Assistance Recommended at Discharge PRN  Patient can return home with the following  Assistance with cooking/housework;Assist for transportation    Equipment Recommendations None recommended by PT  Recommendations for Other Services       Functional Status Assessment Patient has had a recent decline in their functional status and demonstrates the ability to make significant improvements in function in a reasonable and predictable amount of time.     Precautions / Restrictions Precautions Precautions: Fall;Other  (comment) Precaution Comments: watch SpO2 Restrictions Weight Bearing Restrictions: No      Mobility  Bed Mobility               General bed mobility comments: Pt sitting up in chair upon arrival    Transfers Overall transfer level: Needs assistance Equipment used: Rolling walker (2 wheels) Transfers: Sit to/from Stand Sit to Stand: Supervision           General transfer comment: Supervision for safety to come to stand from recliner    Ambulation/Gait Ambulation/Gait assistance: Supervision Gait Distance (Feet): 10 Feet Assistive device: Rolling walker (2 wheels) Gait Pattern/deviations: Step-through pattern, Decreased stride length Gait velocity: reduced Gait velocity interpretation: <1.31 ft/sec, indicative of household ambulator   General Gait Details: Pt with slow gait but no LOB, only stepping forward and backward in room due to pt not feeling well and having multiple bouts of emesis today  Stairs            Wheelchair Mobility    Modified Rankin (Stroke Patients Only)       Balance Overall balance assessment: Needs assistance Sitting-balance support: No upper extremity supported, Feet supported Sitting balance-Leahy Scale: Good     Standing balance support: Bilateral upper extremity supported, During functional activity Standing balance-Leahy Scale: Poor Standing balance comment: Reliant on RW to ambulate                             Pertinent Vitals/Pain Pain Assessment Pain Assessment: Faces Faces Pain Scale: Hurts a little bit Pain Location: R knee Pain Descriptors / Indicators: Discomfort Pain Intervention(s): Monitored during session  Home Living Family/patient expects to be discharged to:: Assisted living (ILF) Living Arrangements: Alone Available Help at Discharge: Other (Comment) (can hire someone if needed) Type of Home: Independent living facility Home Access: Level entry       Home Layout: One  [place]: Rollator (4 wheels);Electric scooter;Shower seat;Grab bars - toilet;Grab bars - tub/shower Additional Comments: just started to use 2-3 L O2 a few days before coming to hospital    Prior Function Prior Level of Function : Independent/Modified Independent             Mobility Comments: Typically walks in the house without AD, but uses rollator for exercise in the community and the electric scooter for longer distance mobility. x1 fall recently when reaching up to manage a lamp ADLs Comments: Does not drive. Someone cleans every other week and someone comes 2x daily to provide him with his meds.     Hand Dominance        Extremity/Trunk Assessment   Upper Extremity Assessment Upper Extremity Assessment: Generalized weakness    Lower Extremity Assessment Lower Extremity Assessment: Generalized weakness (edema bil; R knee more swollen than L)    Cervical / Trunk Assessment Cervical / Trunk Assessment: Normal  Communication   Communication: HOH  Cognition Arousal/Alertness: Awake/alert Behavior During Therapy: WFL for tasks assessed/performed Overall Cognitive Status: Within Functional Limits for tasks assessed                                 General Comments: Pt limited by nausea this date        General Comments General comments (skin integrity, edema, etc.): VSS on 4L O2    Exercises     Assessment/Plan    PT Assessment Patient needs continued PT services  PT Problem List Decreased strength;Decreased activity tolerance;Decreased balance;Decreased mobility;Cardiopulmonary status limiting activity       PT Treatment Interventions DME instruction;Gait training;Functional mobility training;Therapeutic activities;Therapeutic exercise;Balance training;Neuromuscular re-education;Patient/family education    PT Goals (Current goals can be found in the Care Plan section)  Acute Rehab PT Goals Patient Stated Goal: to feel better PT  Goal Formulation: With patient Time For Goal Achievement: 02/19/22 Potential to Achieve Goals: Good    Frequency Min 3X/week     Co-evaluation               AM-PAC PT "6 Clicks" Mobility  Outcome Measure Help needed turning from your back to your side while in a flat bed without using bedrails?: A Little Help needed moving from lying on your back to sitting on the side of a flat bed without using bedrails?: A Little Help needed moving to and from a bed to a chair (including a wheelchair)?: A Little Help needed standing up from a chair using your arms (e.g., wheelchair or bedside chair)?: A Little Help needed to walk in hospital room?: A Little Help needed climbing 3-5 steps with a railing? : A Little 6 Click Score: 18    End of Session Equipment Utilized During Treatment: Oxygen Activity Tolerance: Other (comment) (limited by nausea) Patient left: in chair;with call bell/phone within reach   PT Visit Diagnosis: Unsteadiness on feet (R26.81);Other abnormalities of gait and mobility (R26.89);Muscle weakness (generalized) (M62.81)    Time: 9233-0076 PT Time Calculation (min) (ACUTE ONLY): 21 min   Charges:   PT Evaluation $PT Eval Moderate Complexity: 1 Mod          Shawntez Dickison  Pettis, PT, DPT Acute Rehabilitation Services  Office: [place] 02/05/2022, 3:51 PM

## 2022-02-05 NOTE — Progress Notes (Addendum)
PROGRESS NOTE        PATIENT DETAILS Name: Ryan [name] Age: 89 y.o. Sex: male Date of Birth: 1931-09-09 Admit Date: 02/02/2022 Admitting Physician Rhetta Mura, DO FBP:[id number], Rocco Pauls, NP  Brief Summary: Patient is a 89 y.o.  male with history of chronic HFpEF, right-sided heart failure, ILD, PAF, COPD, non-Hodgkin's lymphoma-s/p radiation therapy now on remission-presented with decompensated heart failure and AKI.  Significant events: 1/2>> admit to [facility]-decompensated heart failure  Significant studies: 1/2>> CXR: Prominent bilateral pulmonary interstitial opacities. 1/3>> echo: EF 14-70%, grade 2 diastolic dysfunction.  Significant microbiology data: 1/3>> COVID/influenza/RSV PCR: Negative 1/3>> urine culture: < 10,000 colonies/mL-insignificant growth  Procedures: None  Consults: Cardiology  Subjective: Did not sleep well-asking for his usual Xanax dosing at night.  Lower extremity edema swelling.  Objective: Vitals: Blood pressure (!) 110/52, pulse 93, temperature 97.8 F (36.6 C), temperature source Oral, resp. rate 18, height _0  (1.651 m), weight 80 kg, SpO2 98 %.   Exam: Gen Exam:Alert awake-not in any distress HEENT:atraumatic, normocephalic Chest: B/L clear to auscultation anteriorly CVS:S1S2 regular Abdomen:soft non tender, non distended Extremities:++ edema Neurology: Non focal Skin: no rash  Pertinent Labs/Radiology:    Latest Ref Rng & Units 02/04/2022   12:38 AM 02/03/2022    6:30 AM 02/02/2022   12:09 PM  CBC  WBC 4.0 - 10.5 K/uL 10.7  7.3  7.6   Hemoglobin 13.0 - 17.0 g/dL 10.2  9.0  9.0   Hematocrit 39.0 - 52.0 % 33.5  29.2  30.0   Platelets 150 - 400 K/uL 405  347  350     Lab Results  Component Value Date   NA 142 02/05/2022   K 4.5 02/05/2022   CL 100 02/05/2022   CO2 30 02/05/2022      Assessment/Plan: Acute on chronic HFpEF RV failure from pulmonary hypertension Volume status  improved-RHC today showed normal filling pressures  Has been transitioned back to torsemide  Continue midodrine/Toprol  Pulmonary hypertension Thought to be due to [facility] and ILD Remains on Uptravi and riociguat RHC on 1/5 showed normal filling pressures and mild pulmonary hypertension.  Vomiting/diarrhea Occurred this morning Will treat with supportive care-Zofran/Imodium for now and see how he does.  AKI on CKD stage IIIb AKI hemodynamically mediated-improving with supportive care  Hypokalemia Due to diuretic Repleted.  Complicated UTI Possible bilateral lower extremity cellulitis versus erythema from venous congestion in the setting of edema Nontoxic-appearing-afebrile Urine cultures negative but still has some erythema Given diagnostic uncertainty-reasonable to treat with 5 days of IV antibiotics. Suspect erythema/edema will improve with further diuresis.  PAF Sinus rhythm On metoprolol/Eliquis.  Normocytic anemia Likely due to CKD Watch closely.  Chronic hypoxic respiratory failure on home O2 ILD Appears stable-remains on 2 L of oxygen  BPH Continue Proscar No signs of urinary retention-felt significantly good urine output overnight.  History of non-Hodgkin's lymphoma-s/p radiation therapy Currently in remission  GERD/hiatal hernia Continue PPI  Restless leg syndrome Continue Requip At patient's request-have spoken with pharmacy to see if we can change to 4 times daily dosing.  Insomnia Patient requesting Xanax-apparently takes it daily but not on his medication list. Spoke with spouse-she gives him a 0.25 mg of Xanax nightly from her own supply-this has been ongoing for 6 weeks. Will place him on scheduled melatonin-and as needed Xanax.  Addendum Few  episodes of nausea/vomiting and diarrhea throughout the day Downgrade to clear liquids Supportive care with antiemetics/Imodium If persists-will consider further workup including  imaging.   Obesity: Estimated body mass index is 29.35 kg/m as calculated from the following:   Height as of this encounter: _0  (1.651 m).   Weight as of this encounter: 80 kg.   Code status:   Code Status: DNR   DVT Prophylaxis: SCD's Start: 02/05/22 0822 apixaban (ELIQUIS) tablet 2.5 mg Start: 02/05/22 0815 SCDs Start: 02/03/22 0759 SCDs Start: 02/03/22 0542 apixaban (ELIQUIS) tablet 2.5 mg Eliquis  Family Communication: Spouse-Jane-[phone] -updated over the phone on 1/5   Disposition Plan: Status is: Inpatient Remains inpatient appropriate because: Severity of illness   Planned Discharge Destination:Home health   Diet: Diet Order             Diet 2 gram sodium Room service appropriate? Yes; Fluid consistency: Thin  Diet effective now                     Antimicrobial agents: Anti-infectives (From admission, onward)    Start     Dose/Rate Route Frequency Ordered Stop   02/03/22 0515  cefTRIAXone (ROCEPHIN) 1 g in sodium chloride 0.9 % 100 mL IVPB        1 g 200 mL/hr over 30 Minutes Intravenous Every 24 hours 02/03/22 0501 02/08/22 0559        MEDICATIONS: Scheduled Meds:  apixaban  2.5 mg Oral BID   atorvastatin  40 mg Oral q1800   escitalopram  10 mg Oral Daily   ferrous sulfate  325 mg Oral Daily   finasteride  5 mg Oral Daily   gabapentin  100 mg Oral QHS   metoprolol succinate  12.5 mg Oral Daily   midodrine  10 mg Oral TID WC   potassium chloride SA  60 mEq Oral Daily   Riociguat  2.5 mg Oral TID   rOPINIRole  2 mg Oral QID   Selexipag  1,600 mcg Oral Q12H   And   Selexipag  400 mcg Oral Q12H   sodium chloride flush  3 mL Intravenous Q12H   sodium chloride flush  3 mL Intravenous Q12H   sodium chloride flush  3 mL Intravenous Q12H   torsemide  100 mg Oral BID   Continuous Infusions:  sodium chloride     cefTRIAXone (ROCEPHIN)  IV 1 g (02/05/22 0619)   PRN Meds:.sodium chloride, acetaminophen **OR** acetaminophen,  acetaminophen, fluticasone, hydrALAZINE, labetalol, menthol-cetylpyridinium, ondansetron (ZOFRAN) IV, sodium chloride, sodium chloride flush, traMADol   I have personally reviewed following labs and imaging studies  LABORATORY DATA: CBC: Recent Labs  Lab 02/02/22 1209 02/03/22 0630 02/04/22 0038  WBC 7.6 7.3 10.7*  HGB 9.0* 9.0* 10.2*  HCT 30.0* 29.2* 33.5*  MCV 86.2 84.4 84.8  PLT 350 347 405*     Basic Metabolic Panel: Recent Labs  Lab 02/02/22 1209 02/03/22 0630 02/04/22 0038 02/04/22 1418 02/05/22 0109  NA 134* 140 141 140 142  K 2.9* 2.9* 2.9* 3.3* 4.5  CL 94* 94* 95* 96* 100  CO2 _1 GLUCOSE 93 84 155* 171* 108*  BUN 111* 98* 76* 64* 57*  CREATININE 2.00* 1.82* 1.54* 1.50* 1.36*  CALCIUM 6.8* 6.6* 7.2* 7.1* 7.2*  MG  --  2.0  --   --   --      GFR: Estimated Creatinine Clearance: 36.6 mL/min (A) (by C-G formula based on SCr of 1.36  mg/dL (H)).  Liver Function Tests: Recent Labs  Lab 02/03/22 0630  AST 10*  ALT 9  ALKPHOS 103  BILITOT 0.6  PROT 6.4*  ALBUMIN 3.0*    No results for input(s): "LIPASE", "AMYLASE" in the last 168 hours. No results for input(s): "AMMONIA" in the last 168 hours.  Coagulation Profile: No results for input(s): "INR", "PROTIME" in the last 168 hours.  Cardiac Enzymes: No results for input(s): "CKTOTAL", "CKMB", "CKMBINDEX", "TROPONINI" in the last 168 hours.  BNP (last 3 results) No results for input(s): "PROBNP" in the last 8760 hours.  Lipid Profile: No results for input(s): "CHOL", "HDL", "LDLCALC", "TRIG", "CHOLHDL", "LDLDIRECT" in the last 72 hours.  Thyroid Function Tests: No results for input(s): "TSH", "T4TOTAL", "FREET4", "T3FREE", "THYROIDAB" in the last 72 hours.  Anemia Panel: No results for input(s): "VITAMINB12", "FOLATE", "FERRITIN", "TIBC", "IRON", "RETICCTPCT" in the last 72 hours.  Urine analysis:    Component Value Date/Time   COLORURINE YELLOW 02/02/2022 1245   APPEARANCEUR  HAZY (A) 02/02/2022 1245   LABSPEC 1.009 02/02/2022 1245   LABSPEC 1.025 06/28/2013 1321   PHURINE 5.0 02/02/2022 1245   GLUCOSEU NEGATIVE 02/02/2022 1245   GLUCOSEU Negative 06/28/2013 1321   HGBUR NEGATIVE 02/02/2022 1245   BILIRUBINUR NEGATIVE 02/02/2022 1245   BILIRUBINUR Negative 06/28/2013 1321   KETONESUR NEGATIVE 02/02/2022 1245   PROTEINUR NEGATIVE 02/02/2022 1245   UROBILINOGEN 1.0 01/04/2014 2156   UROBILINOGEN 0.2 06/28/2013 1321   NITRITE NEGATIVE 02/02/2022 1245   LEUKOCYTESUR LARGE (A) 02/02/2022 1245   LEUKOCYTESUR Negative 06/28/2013 1321    Sepsis Labs: Lactic Acid, Venous    Component Value Date/Time   LATICACIDVEN 0.6 10/21/2020 0255    MICROBIOLOGY: Recent Results (from the past 240 hour(s))  Urine Culture     Status: Abnormal   Collection Time: 02/03/22  4:58 AM   Specimen: Urine, Clean Catch  Result Value Ref Range Status   Specimen Description URINE, CLEAN CATCH  Final   Special Requests NONE  Final   Culture (A)  Final    <10,000 COLONIES/mL INSIGNIFICANT GROWTH Performed at [facility], [place] [address] [place], [place] [address]    Report Status 02/04/2022 FINAL  Final  Resp panel by RT-PCR (RSV, Flu A&B, Covid) Urine, Clean Catch     Status: None   Collection Time: 02/03/22  8:04 AM   Specimen: Urine, Clean Catch; Nasal Swab  Result Value Ref Range Status   SARS Coronavirus 2 by RT PCR NEGATIVE NEGATIVE Final    Comment: (NOTE) SARS-CoV-2 target nucleic acids are NOT DETECTED.  The SARS-CoV-2 RNA is generally detectable in upper respiratory specimens during the acute phase of infection. The lowest concentration of SARS-CoV-2 viral copies this assay can detect is 138 copies/mL. A negative result does not preclude SARS-Cov-2 infection and should not be used as the sole basis for treatment or other patient management decisions. A negative result may occur with  improper specimen collection/handling, submission of specimen other than  nasopharyngeal swab, presence of viral mutation(s) within the areas targeted by this assay, and inadequate number of viral copies(<138 copies/mL). A negative result must be combined with clinical observations, patient history, and epidemiological information. The expected result is Negative.  Fact Sheet for Patients:  EntrepreneurPulse.com.au  Fact Sheet for Healthcare Providers:  IncredibleEmployment.be  This test is no t yet approved or cleared by the [place] FDA and  has been authorized for detection and/or diagnosis of SARS-CoV-2 by FDA under an Emergency Use Authorization (EUA).  This EUA will remain  in effect (meaning this test can be used) for the duration of the COVID-19 declaration under Section 564(b)(1) of the Act, 21 U.S.C.section 360bbb-3(b)(1), unless the authorization is terminated  or revoked sooner.       Influenza A by PCR NEGATIVE NEGATIVE Final   Influenza B by PCR NEGATIVE NEGATIVE Final    Comment: (NOTE) The Xpert Xpress SARS-CoV-2/FLU/RSV plus assay is intended as an aid in the diagnosis of influenza from Nasopharyngeal swab specimens and should not be used as a sole basis for treatment. Nasal washings and aspirates are unacceptable for Xpert Xpress SARS-CoV-2/FLU/RSV testing.  Fact Sheet for Patients: EntrepreneurPulse.com.au  Fact Sheet for Healthcare Providers: IncredibleEmployment.be  This test is not yet approved or cleared by the [place] FDA and has been authorized for detection and/or diagnosis of SARS-CoV-2 by FDA under an Emergency Use Authorization (EUA). This EUA will remain in effect (meaning this test can be used) for the duration of the COVID-19 declaration under Section 564(b)(1) of the Act, 21 U.S.C. section 360bbb-3(b)(1), unless the authorization is terminated or revoked.     Resp Syncytial Virus by PCR NEGATIVE NEGATIVE Final    Comment:  (NOTE) Fact Sheet for Patients: EntrepreneurPulse.com.au  Fact Sheet for Healthcare Providers: IncredibleEmployment.be  This test is not yet approved or cleared by the [place] FDA and has been authorized for detection and/or diagnosis of SARS-CoV-2 by FDA under an Emergency Use Authorization (EUA). This EUA will remain in effect (meaning this test can be used) for the duration of the COVID-19 declaration under Section 564(b)(1) of the Act, 21 U.S.C. section 360bbb-3(b)(1), unless the authorization is terminated or revoked.  Performed at [facility], [place] [address], [place], [place] [address]     RADIOLOGY STUDIES/RESULTS: CARDIAC CATHETERIZATION  Result Date: 02/05/2022 1. Filling pressures in normal range. 2. Mild pulmonary hypertension, looks like predominantly pulmonary venous hypertension. 3. Preserved cardiac output.   ECHOCARDIOGRAM COMPLETE  Result Date: 02/03/2022    ECHOCARDIOGRAM REPORT   Patient Name:   NIMESH RIOLO Date of Exam: 02/03/2022 Medical Rec #:  [id number]       Height:       65.0 in Accession #:    [id number]      Weight:       187.0 lb Date of Birth:  September 03, 1931      BSA:          1.922 m Patient Age:    87 years        BP:           94/54 mmHg Patient Gender: M               HR:           89 bpm. Exam Location:  Inpatient Procedure: 2D Echo, Cardiac Doppler and Color Doppler Indications:    CHF-Acute Diastolic H67.59  History:        Patient has prior history of Echocardiogram examinations, most                 recent 08/17/2021. CHF, Stroke and COPD; Risk                 Factors:Hypertension, Sleep Apnea, Diabetes and Dyslipidemia.                 CKD stage III.  Sonographer:    Ronny Flurry Referring Phys: [id number] RONDELL A SMITH IMPRESSIONS  1. Left ventricular ejection fraction, by estimation, is  65 to 70%. The left ventricle has normal function. The left ventricle has no regional wall motion abnormalities. Left  ventricular diastolic parameters are consistent with Grade II diastolic dysfunction (pseudonormalization). Elevated left atrial pressure.  2. Right ventricular systolic function was not well visualized. The right ventricular size is not well visualized. Tricuspid regurgitation signal is inadequate for assessing PA pressure.  3. Left atrial size was mildly dilated.  4. The mitral valve is grossly normal. No evidence of mitral valve regurgitation.  5. The aortic valve is tricuspid. Aortic valve regurgitation is not visualized. Aortic valve sclerosis is present, with no evidence of aortic valve stenosis.  6. The inferior vena cava is normal in size with greater than 50% respiratory variability, suggesting right atrial pressure of 3 mmHg.  7. Cannot exclude a small PFO. Comparison(s): No significant change from prior study. FINDINGS  Left Ventricle: Left ventricular ejection fraction, by estimation, is 65 to 70%. The left ventricle has normal function. The left ventricle has no regional wall motion abnormalities. The left ventricular internal cavity size was small. Suboptimal image quality limits for assessment of left ventricular hypertrophy. Left ventricular diastolic parameters are consistent with Grade II diastolic dysfunction (pseudonormalization). Elevated left atrial pressure. Right Ventricle: The right ventricular size is not well visualized. Right vetricular wall thickness was not well visualized. Right ventricular systolic function was not well visualized. Tricuspid regurgitation signal is inadequate for assessing PA pressure. Left Atrium: Left atrial size was mildly dilated. Right Atrium: Right atrial size was normal in size. Pericardium: There is no evidence of pericardial effusion. Mitral Valve: The mitral valve is grossly normal. Mild to moderate mitral annular calcification. No evidence of mitral valve regurgitation. Tricuspid Valve: The tricuspid valve is normal in structure. Tricuspid valve  regurgitation is not demonstrated. No evidence of tricuspid stenosis. Aortic Valve: The aortic valve is tricuspid. There is mild aortic valve annular calcification. Aortic valve regurgitation is not visualized. Aortic valve sclerosis is present, with no evidence of aortic valve stenosis. Aortic valve mean gradient measures  7.0 mmHg. Aortic valve peak gradient measures 12.1 mmHg. Aortic valve area, by VTI measures 2.50 cm. Pulmonic Valve: The pulmonic valve was not well visualized. Pulmonic valve regurgitation is not visualized. Aorta: The aortic root and ascending aorta are structurally normal, with no evidence of dilitation. Venous: The inferior vena cava is normal in size with greater than 50% respiratory variability, suggesting right atrial pressure of 3 mmHg. IAS/Shunts: Cannot exclude a small PFO.  LEFT VENTRICLE PLAX 2D LVOT diam:     2.00 cm   Diastology LV SV:         77        LV e' medial:    6.20 cm/s LV SV Index:   40        LV E/e' medial:  19.8 LVOT Area:     3.14 cm  LV e' lateral:   8.59 cm/s                          LV E/e' lateral: 14.3  RIGHT VENTRICLE RV S prime:     17.30 cm/s TAPSE (M-mode): 1.5 cm LEFT ATRIUM             Index        RIGHT ATRIUM           Index LA Vol (A2C):   64.3 ml 33.45 ml/m  RA Area:     11.00 cm LA Vol (A4C):  50.3 ml 26.16 ml/m  RA Volume:   17.10 ml  8.89 ml/m LA Biplane Vol: 58.8 ml 30.59 ml/m  AORTIC VALVE AV Area (Vmax):    2.65 cm AV Area (Vmean):   2.57 cm AV Area (VTI):     2.50 cm AV Vmax:           174.00 cm/s AV Vmean:          119.000 cm/s AV VTI:            0.309 m AV Peak Grad:      12.1 mmHg AV Mean Grad:      7.0 mmHg LVOT Vmax:         146.50 cm/s LVOT Vmean:        97.400 cm/s LVOT VTI:          0.246 m LVOT/AV VTI ratio: 0.80  AORTA Ao Root diam: 3.50 cm Ao Asc diam:  2.80 cm MITRAL VALVE MV Area (PHT): 3.65 cm     SHUNTS MV Decel Time: 208 msec     Systemic VTI:  0.25 m MV E velocity: 123.00 cm/s  Systemic Diam: 2.00 cm MV A velocity:  104.00 cm/s MV E/A ratio:  1.18 Rudean Haskell MD Electronically signed by Rudean Haskell MD Signature Date/Time: 02/03/2022/1:24:10 PM    Final      LOS: 2 days   Oren Binet, MD  [facility]    To contact the attending provider between 7A-7P or the covering provider during after hours 7P-7A, please log into the web site [email or web address] and access using universal [place] password for that web site. If you do not have the password, please call the hospital operator.  02/05/2022, 11:29 AM

## 2022-02-06 DIAGNOSIS — E7800 Pure hypercholesterolemia, unspecified: Secondary | ICD-10-CM | POA: Diagnosis not present

## 2022-02-06 DIAGNOSIS — I480 Paroxysmal atrial fibrillation: Secondary | ICD-10-CM | POA: Diagnosis not present

## 2022-02-06 DIAGNOSIS — I5033 Acute on chronic diastolic (congestive) heart failure: Secondary | ICD-10-CM | POA: Diagnosis not present

## 2022-02-06 DIAGNOSIS — D509 Iron deficiency anemia, unspecified: Secondary | ICD-10-CM | POA: Diagnosis not present

## 2022-02-06 LAB — CBC
HCT: 32.1 % — ABNORMAL LOW (ref 39.0–52.0)
Hemoglobin: 9.5 g/dL — ABNORMAL LOW (ref 13.0–17.0)
MCH: 25.7 pg — ABNORMAL LOW (ref 26.0–34.0)
MCHC: 29.6 g/dL — ABNORMAL LOW (ref 30.0–36.0)
MCV: 86.8 fL (ref 80.0–100.0)
Platelets: 382 10*3/uL (ref 150–400)
RBC: 3.7 MIL/uL — ABNORMAL LOW (ref 4.22–5.81)
RDW: 17.1 % — ABNORMAL HIGH (ref 11.5–15.5)
WBC: 10.6 10*3/uL — ABNORMAL HIGH (ref 4.0–10.5)
nRBC: 0 % (ref 0.0–0.2)

## 2022-02-06 LAB — BASIC METABOLIC PANEL
Anion gap: 10 (ref 5–15)
BUN: 63 mg/dL — ABNORMAL HIGH (ref 8–23)
CO2: 27 mmol/L (ref 22–32)
Calcium: 7.8 mg/dL — ABNORMAL LOW (ref 8.9–10.3)
Chloride: 106 mmol/L (ref 98–111)
Creatinine, Ser: 1.65 mg/dL — ABNORMAL HIGH (ref 0.61–1.24)
GFR, Estimated: 40 mL/min — ABNORMAL LOW (ref >60)
Glucose, Bld: 125 mg/dL — ABNORMAL HIGH (ref 70–99)
Potassium: 4.7 mmol/L (ref 3.5–5.1)
Sodium: 143 mmol/L (ref 135–145)

## 2022-02-06 MED ORDER — CEPHALEXIN 250 MG PO CAPS
500.0000 mg | ORAL_CAPSULE | Freq: Two times a day (BID) | ORAL | Status: AC
  Administered 2022-02-06 (×2): 500 mg via ORAL
  Filled 2022-02-06 (×2): qty 2

## 2022-02-06 NOTE — Progress Notes (Signed)
Spoke with patient's wife Ryan [name] via phone. She was updated that patient has tolerated full liquid diet- diet will be advance. ( Per wife, patient is gluten free) Patient received zofran x1 this morning and overnight. No vomiting so far today. Wife inquired when patient would be discharged, she was informed potentially tomorrow per MD this morning. Wife stated " if possible I would like to keep him there until Monday". Per wife, she will is out of town and facility does not have much staff on weekends. Wife requesting to speak with doctor later this evening.   MD informed.

## 2022-02-06 NOTE — Progress Notes (Signed)
PROGRESS NOTE        PATIENT DETAILS Name: Ryan [name] Age: 89 y.o. Sex: male Date of Birth: January 16, 1932 Admit Date: 02/02/2022 Admitting Physician Rhetta Mura, DO WGY:[id number], Rocco Pauls, NP  Brief Summary: Patient is a 89 y.o.  male with history of chronic HFpEF, right-sided heart failure, ILD, PAF, COPD, non-Hodgkin's lymphoma-s/p radiation therapy now on remission-presented with decompensated heart failure and AKI.  Significant events: 1/2>> admit to [facility]-decompensated heart failure  Significant studies: 1/2>> CXR: Prominent bilateral pulmonary interstitial opacities. 1/3>> echo: EF 01-77%, grade 2 diastolic dysfunction.  Significant microbiology data: 1/3>> COVID/influenza/RSV PCR: Negative 1/3>> urine culture: < 10,000 colonies/mL-insignificant growth  Procedures: None  Consults: Cardiology  Subjective: Feels better.  Last episode of vomiting was yesterday evening.  No further diarrhea.  Objective: Vitals: Blood pressure 91/62, pulse 93, temperature 97.9 F (36.6 C), temperature source Oral, resp. rate 20, height _0  (1.651 m), weight 80 kg, SpO2 94 %.   Exam: Gen Exam:Alert awake-not in any distress HEENT:atraumatic, normocephalic Chest: B/L clear to auscultation anteriorly CVS:S1S2 regular Abdomen:soft non tender, non distended Extremities:++ edema-mild erythema persists. Neurology: Non focal Skin: no rash  Pertinent Labs/Radiology:    Latest Ref Rng & Units 02/06/2022   12:45 AM 02/05/2022    8:00 AM 02/05/2022    7:59 AM  CBC  WBC 4.0 - 10.5 K/uL 10.6     Hemoglobin 13.0 - 17.0 g/dL 9.5  9.9  10.2   Hematocrit 39.0 - 52.0 % 32.1  29.0  30.0   Platelets 150 - 400 K/uL 382       Lab Results  Component Value Date   NA 143 02/06/2022   K 4.7 02/06/2022   CL 106 02/06/2022   CO2 27 02/06/2022      Assessment/Plan: Acute on chronic HFpEF RV failure from pulmonary hypertension Volume status improved-RHC 1/5  showed normal filling pressures.   No longer on IV Lasix-has been transitioned back to torsemide.  Continue midodrine/Toprol  Pulmonary hypertension Thought to be due to [facility] and ILD Remains on Uptravi and riociguat RHC on 1/5 showed normal filling pressures and mild pulmonary hypertension.  Vomiting/diarrhea Occurred on 1/5-unclear etiology-perhaps a viral syndrome Was downgraded to clear liquid diet-since symptoms are better-advance diet. Continue with as needed antiemetics/Imodium.  AKI on CKD stage IIIb AKI hemodynamically mediated-improving with supportive care  Hypokalemia Due to diuretic Repleted.  Complicated UTI Possible bilateral lower extremity cellulitis versus erythema from venous congestion in the setting of edema Nontoxic-appearing-afebrile Urine cultures negative but still has some erythema Given diagnostic uncertainty-reasonable to treat with 5 days of IV antibiotics. Suspect erythema/edema will improve with further diuresis.  PAF Sinus rhythm On metoprolol/Eliquis.  Normocytic anemia Likely due to CKD Watch closely.  Chronic hypoxic respiratory failure on home O2 ILD Appears stable-remains on 2 L of oxygen  BPH Continue Proscar No signs of urinary retention-felt significantly good urine output overnight.  History of non-Hodgkin's lymphoma-s/p radiation therapy Currently in remission  GERD/hiatal hernia Continue PPI  Restless leg syndrome Continue Requip  Insomnia Patient requesting Xanax-apparently takes it daily but not on his medication list. Spoke with spouse on 1/5-she gives him a 0.25 mg of Xanax nightly from her own supply-this has been ongoing for 6 weeks. Will place him on scheduled melatonin-and as needed Xanax.  Obesity: Estimated body mass index is 29.35 kg/m as  calculated from the following:   Height as of this encounter: _0  (1.651 m).   Weight as of this encounter: 80 kg.   Code status:   Code Status: DNR   DVT  Prophylaxis: SCD's Start: 02/05/22 0822 apixaban (ELIQUIS) tablet 2.5 mg Start: 02/05/22 0815 SCDs Start: 02/03/22 0759 SCDs Start: 02/03/22 0542 apixaban (ELIQUIS) tablet 2.5 mg Eliquis  Family Communication: Spouse-Jane-[phone] -updated over the phone on 1/5   Disposition Plan: Status is: Inpatient Remains inpatient appropriate because: Severity of illness   Planned Discharge Destination:Home health   Diet: Diet Order             Diet full liquid Room service appropriate? Yes; Fluid consistency: Thin  Diet effective now                     Antimicrobial agents: Anti-infectives (From admission, onward)    Start     Dose/Rate Route Frequency Ordered Stop   02/03/22 0515  cefTRIAXone (ROCEPHIN) 1 g in sodium chloride 0.9 % 100 mL IVPB        1 g 200 mL/hr over 30 Minutes Intravenous Every 24 hours 02/03/22 0501 02/08/22 0559        MEDICATIONS: Scheduled Meds:  apixaban  2.5 mg Oral BID   atorvastatin  40 mg Oral q1800   escitalopram  10 mg Oral Daily   ferrous sulfate  325 mg Oral Daily   finasteride  5 mg Oral Daily   gabapentin  100 mg Oral QHS   melatonin  5 mg Oral QHS   metoprolol succinate  12.5 mg Oral Daily   midodrine  10 mg Oral TID WC   potassium chloride SA  60 mEq Oral Daily   Riociguat  2.5 mg Oral TID   rOPINIRole  2 mg Oral QID   Selexipag  1,600 mcg Oral Q12H   And   Selexipag  400 mcg Oral Q12H   sodium chloride flush  3 mL Intravenous Q12H   sodium chloride flush  3 mL Intravenous Q12H   sodium chloride flush  3 mL Intravenous Q12H   torsemide  100 mg Oral BID   Continuous Infusions:  sodium chloride     cefTRIAXone (ROCEPHIN)  IV 1 g (02/06/22 0628)   PRN Meds:.sodium chloride, acetaminophen **OR** acetaminophen, acetaminophen, ALPRAZolam, fluticasone, loperamide, menthol-cetylpyridinium, ondansetron (ZOFRAN) IV, sodium chloride, sodium chloride flush, traMADol   I have personally reviewed following labs and imaging  studies  LABORATORY DATA: CBC: Recent Labs  Lab 02/02/22 1209 02/03/22 0630 02/04/22 0038 02/05/22 0759 02/05/22 0800 02/06/22 0045  WBC 7.6 7.3 10.7*  --   --  10.6*  HGB 9.0* 9.0* 10.2* 10.2* 9.9* 9.5*  HCT 30.0* 29.2* 33.5* 30.0* 29.0* 32.1*  MCV 86.2 84.4 84.8  --   --  86.8  PLT 350 347 405*  --   --  382     Basic Metabolic Panel: Recent Labs  Lab 02/03/22 0630 02/04/22 0038 02/04/22 1418 02/05/22 0109 02/05/22 0759 02/05/22 0800 02/06/22 0045  NA 140 141 140 142 144 144 143  K 2.9* 2.9* 3.3* 4.5 4.1 4.1 4.7  CL 94* 95* 96* 100  --   --  106  CO2 _1 --   --  27  GLUCOSE 84 155* 171* 108*  --   --  125*  BUN 98* 76* 64* 57*  --   --  63*  CREATININE 1.82* 1.54* 1.50* 1.36*  --   --  1.65*  CALCIUM 6.6* 7.2* 7.1* 7.2*  --   --  7.8*  MG 2.0  --   --   --   --   --   --      GFR: Estimated Creatinine Clearance: 30.2 mL/min (A) (by C-G formula based on SCr of 1.65 mg/dL (H)).  Liver Function Tests: Recent Labs  Lab 02/03/22 0630  AST 10*  ALT 9  ALKPHOS 103  BILITOT 0.6  PROT 6.4*  ALBUMIN 3.0*    No results for input(s): "LIPASE", "AMYLASE" in the last 168 hours. No results for input(s): "AMMONIA" in the last 168 hours.  Coagulation Profile: No results for input(s): "INR", "PROTIME" in the last 168 hours.  Cardiac Enzymes: No results for input(s): "CKTOTAL", "CKMB", "CKMBINDEX", "TROPONINI" in the last 168 hours.  BNP (last 3 results) No results for input(s): "PROBNP" in the last 8760 hours.  Lipid Profile: No results for input(s): "CHOL", "HDL", "LDLCALC", "TRIG", "CHOLHDL", "LDLDIRECT" in the last 72 hours.  Thyroid Function Tests: No results for input(s): "TSH", "T4TOTAL", "FREET4", "T3FREE", "THYROIDAB" in the last 72 hours.  Anemia Panel: No results for input(s): "VITAMINB12", "FOLATE", "FERRITIN", "TIBC", "IRON", "RETICCTPCT" in the last 72 hours.  Urine analysis:    Component Value Date/Time   COLORURINE YELLOW  02/02/2022 1245   APPEARANCEUR HAZY (A) 02/02/2022 1245   LABSPEC 1.009 02/02/2022 1245   LABSPEC 1.025 06/28/2013 1321   PHURINE 5.0 02/02/2022 1245   GLUCOSEU NEGATIVE 02/02/2022 1245   GLUCOSEU Negative 06/28/2013 1321   HGBUR NEGATIVE 02/02/2022 1245   BILIRUBINUR NEGATIVE 02/02/2022 1245   BILIRUBINUR Negative 06/28/2013 1321   KETONESUR NEGATIVE 02/02/2022 1245   PROTEINUR NEGATIVE 02/02/2022 1245   UROBILINOGEN 1.0 01/04/2014 2156   UROBILINOGEN 0.2 06/28/2013 1321   NITRITE NEGATIVE 02/02/2022 1245   LEUKOCYTESUR LARGE (A) 02/02/2022 1245   LEUKOCYTESUR Negative 06/28/2013 1321    Sepsis Labs: Lactic Acid, Venous    Component Value Date/Time   LATICACIDVEN 0.6 10/21/2020 0255    MICROBIOLOGY: Recent Results (from the past 240 hour(s))  Urine Culture     Status: Abnormal   Collection Time: 02/03/22  4:58 AM   Specimen: Urine, Clean Catch  Result Value Ref Range Status   Specimen Description URINE, CLEAN CATCH  Final   Special Requests NONE  Final   Culture (A)  Final    <10,000 COLONIES/mL INSIGNIFICANT GROWTH Performed at [facility], [place] [address], [place], [place] [address]    Report Status 02/04/2022 FINAL  Final  Resp panel by RT-PCR (RSV, Flu A&B, Covid) Urine, Clean Catch     Status: None   Collection Time: 02/03/22  8:04 AM   Specimen: Urine, Clean Catch; Nasal Swab  Result Value Ref Range Status   SARS Coronavirus 2 by RT PCR NEGATIVE NEGATIVE Final    Comment: (NOTE) SARS-CoV-2 target nucleic acids are NOT DETECTED.  The SARS-CoV-2 RNA is generally detectable in upper respiratory specimens during the acute phase of infection. The lowest concentration of SARS-CoV-2 viral copies this assay can detect is 138 copies/mL. A negative result does not preclude SARS-Cov-2 infection and should not be used as the sole basis for treatment or other patient management decisions. A negative result may occur with  improper specimen collection/handling,  submission of specimen other than nasopharyngeal swab, presence of viral mutation(s) within the areas targeted by this assay, and inadequate number of viral copies(<138 copies/mL). A negative result must be combined with clinical observations, patient history, and epidemiological information.  The expected result is Negative.  Fact Sheet for Patients:  EntrepreneurPulse.com.au  Fact Sheet for Healthcare Providers:  IncredibleEmployment.be  This test is no t yet approved or cleared by the [place] FDA and  has been authorized for detection and/or diagnosis of SARS-CoV-2 by FDA under an Emergency Use Authorization (EUA). This EUA will remain  in effect (meaning this test can be used) for the duration of the COVID-19 declaration under Section 564(b)(1) of the Act, 21 U.S.C.section 360bbb-3(b)(1), unless the authorization is terminated  or revoked sooner.       Influenza A by PCR NEGATIVE NEGATIVE Final   Influenza B by PCR NEGATIVE NEGATIVE Final    Comment: (NOTE) The Xpert Xpress SARS-CoV-2/FLU/RSV plus assay is intended as an aid in the diagnosis of influenza from Nasopharyngeal swab specimens and should not be used as a sole basis for treatment. Nasal washings and aspirates are unacceptable for Xpert Xpress SARS-CoV-2/FLU/RSV testing.  Fact Sheet for Patients: EntrepreneurPulse.com.au  Fact Sheet for Healthcare Providers: IncredibleEmployment.be  This test is not yet approved or cleared by the [place] FDA and has been authorized for detection and/or diagnosis of SARS-CoV-2 by FDA under an Emergency Use Authorization (EUA). This EUA will remain in effect (meaning this test can be used) for the duration of the COVID-19 declaration under Section 564(b)(1) of the Act, 21 U.S.C. section 360bbb-3(b)(1), unless the authorization is terminated or revoked.     Resp Syncytial Virus by PCR NEGATIVE  NEGATIVE Final    Comment: (NOTE) Fact Sheet for Patients: EntrepreneurPulse.com.au  Fact Sheet for Healthcare Providers: IncredibleEmployment.be  This test is not yet approved or cleared by the [place] FDA and has been authorized for detection and/or diagnosis of SARS-CoV-2 by FDA under an Emergency Use Authorization (EUA). This EUA will remain in effect (meaning this test can be used) for the duration of the COVID-19 declaration under Section 564(b)(1) of the Act, 21 U.S.C. section 360bbb-3(b)(1), unless the authorization is terminated or revoked.  Performed at [facility], [place] [address], [place], [place] [address]     RADIOLOGY STUDIES/RESULTS: CARDIAC CATHETERIZATION  Result Date: 02/05/2022 1. Filling pressures in normal range. 2. Mild pulmonary hypertension, looks like predominantly pulmonary venous hypertension. 3. Preserved cardiac output.     LOS: 3 days   Oren Binet, MD  [facility]    To contact the attending provider between 7A-7P or the covering provider during after hours 7P-7A, please log into the web site [email or web address] and access using universal [place] password for that web site. If you do not have the password, please call the hospital operator.  02/06/2022, 10:56 AM

## 2022-02-06 NOTE — Progress Notes (Signed)
Patient c/o of nausea this morning. IV zofran administered.

## 2022-02-06 NOTE — Progress Notes (Signed)
Brief weekend cardiology note  See note from Dr. Aundra Dubin 02/05/22, felt to be ready for discharge from a cardiac perspective. Cardiology will follow peripherally over the weekend, please contact us with questions or concerns.  Buford Dresser, MD, PhD, [facility] at [facility] at [facility] [address] [place], [place] [address] [phone]

## 2022-02-07 DIAGNOSIS — D509 Iron deficiency anemia, unspecified: Secondary | ICD-10-CM | POA: Diagnosis not present

## 2022-02-07 DIAGNOSIS — I272 Pulmonary hypertension, unspecified: Secondary | ICD-10-CM

## 2022-02-07 DIAGNOSIS — E7800 Pure hypercholesterolemia, unspecified: Secondary | ICD-10-CM | POA: Diagnosis not present

## 2022-02-07 DIAGNOSIS — I480 Paroxysmal atrial fibrillation: Secondary | ICD-10-CM | POA: Diagnosis not present

## 2022-02-07 DIAGNOSIS — I5033 Acute on chronic diastolic (congestive) heart failure: Secondary | ICD-10-CM | POA: Diagnosis not present

## 2022-02-07 LAB — BASIC METABOLIC PANEL
Anion gap: 10 (ref 5–15)
BUN: 63 mg/dL — ABNORMAL HIGH (ref 8–23)
CO2: 29 mmol/L (ref 22–32)
Calcium: 8.1 mg/dL — ABNORMAL LOW (ref 8.9–10.3)
Chloride: 102 mmol/L (ref 98–111)
Creatinine, Ser: 1.71 mg/dL — ABNORMAL HIGH (ref 0.61–1.24)
GFR, Estimated: 38 mL/min — ABNORMAL LOW (ref >60)
Glucose, Bld: 97 mg/dL (ref 70–99)
Potassium: 4.4 mmol/L (ref 3.5–5.1)
Sodium: 141 mmol/L (ref 135–145)

## 2022-02-07 MED ORDER — SODIUM CHLORIDE 0.9 % IV SOLN
12.5000 mg | Freq: Four times a day (QID) | INTRAVENOUS | Status: DC | PRN
  Administered 2022-02-07: 12.5 mg via INTRAVENOUS
  Filled 2022-02-07: qty 12.5
  Filled 2022-02-07: qty 0.5

## 2022-02-07 MED ORDER — POLYVINYL ALCOHOL 1.4 % OP SOLN
1.0000 [drp] | OPHTHALMIC | Status: DC | PRN
  Administered 2022-02-07: 1 [drp] via OPHTHALMIC
  Filled 2022-02-07: qty 15

## 2022-02-07 NOTE — Progress Notes (Signed)
Patient needing 5L of O2 to maintain O2 sats at 90%. He continues to want to drink more than 1276m a day, not understanding why he needs to restrict his fluid intake. Educated patient on the reason of the fluid restriction. Notified MD.

## 2022-02-07 NOTE — Progress Notes (Signed)
Patient ID: Ryan [name], male   DOB: 1931-09-08, 89 y.o.   MRN: [id number]     Rounding Note  PCP-Cardiologist: [name], MD   Subjective:    Called by primary team due to concern for atrial fibrillation. Patient reports that he is feeling much worse today, vomiting has returned after eating breakfast. Feeling better after phenergan. He has not noticed change in heart rhythm, just that he overall feels poorly.  Objective:   Weight Range: 79.1 kg Body mass index is 29.02 kg/m.   Vital Signs:   Temp:  [97.9 F (36.6 C)-98.4 F (36.9 C)] 97.9 F (36.6 C) (01/07 0400) Pulse Rate:  [90-104] 102 (01/07 0854) Resp:  [17-24] 20 (01/07 0854) BP: (97-125)/(47-71) 120/69 (01/07 0852) SpO2:  [92 %-99 %] 94 % (01/07 0852) Weight:  [79.1 kg] 79.1 kg (01/07 0600) Last BM Date : 02/05/22  Weight change: Filed Weights   02/03/22 2333 02/05/22 0122 02/07/22 0600  Weight: 82 kg 80 kg 79.1 kg    Intake/Output:   Intake/Output Summary (Last 24 hours) at 02/07/2022 1156 Last data filed at 02/07/2022 0345 Gross per 24 hour  Intake 480 ml  Output 1650 ml  Net -1170 ml      Physical Exam    GEN: Well nourished, well developed. Appears ill NECK: No JVD appreciated sitting upright CARDIAC: regular rhythm, normal S1 and S2, no rubs or gallops. No murmur. VASCULAR: Radial pulses 2+ bilaterally.  RESPIRATORY:  Coarse throughout ABDOMEN: Soft, non-tender, non-distended MUSCULOSKELETAL:  Moves all 4 limbs independently SKIN: Warm and dry, bilateral LE with chronic erythema appearance, both brawny and pitting edema NEUROLOGIC:  No focal neuro deficits noted. PSYCHIATRIC:  Normal affect    Telemetry   Currently NSR with occasional ectopy. Brief time this AM of afib(Personally reviewed)    EKG    Afib at 105 bpm  Labs    CBC Recent Labs    02/05/22 0800 02/06/22 0045  WBC  --  10.6*  HGB 9.9* 9.5*  HCT 29.0* 32.1*  MCV  --  86.8  PLT  --  761   Basic Metabolic  Panel Recent Labs    02/06/22 0045 02/07/22 0025  NA 143 141  K 4.7 4.4  CL 106 102  CO2 27 29  GLUCOSE 125* 97  BUN 63* 63*  CREATININE 1.65* 1.71*  CALCIUM 7.8* 8.1*   Liver Function Tests No results for input(s): "AST", "ALT", "ALKPHOS", "BILITOT", "PROT", "ALBUMIN" in the last 72 hours.  No results for input(s): "LIPASE", "AMYLASE" in the last 72 hours. Cardiac Enzymes No results for input(s): "CKTOTAL", "CKMB", "CKMBINDEX", "TROPONINI" in the last 72 hours.  BNP: BNP (last 3 results) Recent Labs    07/28/21 1439 11/05/21 1229 02/02/22 1209  BNP 40.7 61.0 187.7*    ProBNP (last 3 results) No results for input(s): "PROBNP" in the last 8760 hours.   D-Dimer No results for input(s): "DDIMER" in the last 72 hours. Hemoglobin A1C No results for input(s): "HGBA1C" in the last 72 hours. Fasting Lipid Panel No results for input(s): "CHOL", "HDL", "LDLCALC", "TRIG", "CHOLHDL", "LDLDIRECT" in the last 72 hours. Thyroid Function Tests No results for input(s): "TSH", "T4TOTAL", "T3FREE", "THYROIDAB" in the last 72 hours.  Invalid input(s): "FREET3"  Other results:   Imaging/Testing    RHC Procedural Findings: Hemodynamics (mmHg) RA mean 6 RV 42/9 PA 45/22, mean 32 PCWP mean 14 Oxygen saturations: PA 70% AO 94% Cardiac Output (Fick) 7.49  Cardiac Index (Fick) 3.99 PVR 2.4  WU    Medications:     Scheduled Medications:  apixaban  2.5 mg Oral BID   atorvastatin  40 mg Oral q1800   escitalopram  10 mg Oral Daily   ferrous sulfate  325 mg Oral Daily   finasteride  5 mg Oral Daily   gabapentin  100 mg Oral QHS   melatonin  5 mg Oral QHS   metoprolol succinate  12.5 mg Oral Daily   midodrine  10 mg Oral TID WC   potassium chloride SA  60 mEq Oral Daily   Riociguat  2.5 mg Oral TID   rOPINIRole  2 mg Oral QID   Selexipag  1,600 mcg Oral Q12H   And   Selexipag  400 mcg Oral Q12H   sodium chloride flush  3 mL Intravenous Q12H   sodium chloride flush   3 mL Intravenous Q12H   sodium chloride flush  3 mL Intravenous Q12H   torsemide  100 mg Oral BID    Infusions:  sodium chloride     promethazine (PHENERGAN) injection (IM or IVPB) 12.5 mg (02/07/22 1106)    PRN Medications: sodium chloride, acetaminophen **OR** acetaminophen, acetaminophen, ALPRAZolam, fluticasone, loperamide, menthol-cetylpyridinium, ondansetron (ZOFRAN) IV, promethazine (PHENERGAN) injection (IM or IVPB), sodium chloride, sodium chloride flush, traMADol    Patient Profile   89 y/o male w/ chronic diastolic heart failure w/ prominent RV dysfunction in setting of pulmonary HTN, PAF, h/o CVA, OSA intolerant of CPAP, ILD and carotid artery stenosis, admitted w/ acute on chronic RV failure and UTI.    Assessment/Plan   1. Acute on Chronic diastolic CHF:  -Suspect with significant component of RV failure from pulmonary hypertension.   -PYP scan was not suggestive of TTR cardiac amyloidosis.  -Markedly volume overloaded in the setting of poor compliance w/ home diuretic regimen. NYHA Class IIIb-IV.  -Echo this admission with EF 65-70% with D-shaped septum, mild RV dilation/dysfunction, dilated IVC.   -RHC as above, had normal filling pressures -continue torsemide 100 mg bid, metolazone once weekly as prior to admission - Unable to wear compression stockings/UNNA boots or elevate legs due to RLS.  - Continue midodrine 10 mg tid to promote higher BP in setting of RV failure. - Continue Toprol XL 12.5 mg qhs. - Continue Riociquat 2.5 mg tid -nurse reports that he struggles with fluid restriction  2. AKI on CKD Stage 3b: B/l Scr ~1.7, currently at baseline, AKI resolved  3. Pulmonary hypertension: Patient thought to have [place] out of proportion to his lung disease (ILD, NSIP versus UIP). RHC in 2/20 showed mean PA pressure 41, PVR 5.1 WU, CI 3. Echo in 6/20 with RV reportedly ok => I reviewed and do not think that RV was well-visualized.  Patient did not tolerate Adcirca  due to edema/?cognitive effects.  RHC was done again in 6/20, showed moderate pulmonary arterial hypertension with PVR 5.35 (comparable to prior).  Serological workup for PH was negative. Patient started ambrisentan 5 mg and developed significant edema/volume overload despite a high dose of torsemide. He is now on Uptravi + riociguat. Echo 7/23 showed normal RV size/systolic function and PA systolic pressure estimate was normal.  RHC today showed mild primarily pulmonary venous hypertension.  - He will stay off ambrisentan (? trigger for volume overload).  - As above, he did not tolerate tadalafil.  - He is at 2000 mcg bid Uptravi.  Will continue this dose.   - Continue riociguat at goal dose 2.5 mg tid.  - updated echo  as above   4. Atrial fibrillation: Paroxysmal, noted on ILR. Had brief episode AM 1/7 in the setting of nausea/vomiting - Continue apixaban 2.5 mg bid (dosed for age, renal dysfunction).  - Toprol XL 12.5 mg daily   5. OSA: Cannot tolerate CPAP.   6. CVA: In 3/21, may have been atrial fibrillation-related.  On apixaban now.   7. Carotid stenosis: Carotid duplex 04/23: 40-59% right ICA stenosis, no significant stenosis on the left - Continue statin. - Repeat study 4/24   8. Interstitial lung disease:  Has seen pulmonary, ILD seems mild and stable.   9. UTI: By UA.   -completed antibiotics  Length of Stay: [place], MD  02/07/2022, 11:56 AM  Advanced Heart Failure Team Pager [phone] (M-F; [place])  Please contact [facility] for night-coverage after hours (5p -7a ) and weekends on [email or web address]

## 2022-02-07 NOTE — Progress Notes (Signed)
02/07/22 1100  Assess: MEWS Score  BP (!) 99/58  MAP (mmHg) 70  Pulse Rate (!) 102  ECG Heart Rate (!) 104  Resp (!) 50  SpO2 96 %  Assess: MEWS Score  MEWS Temp 0  MEWS Systolic 1  MEWS Pulse 1  MEWS RR 3  MEWS LOC 0  MEWS Score 5  MEWS Score Color Red  Assess: if the MEWS score is Yellow or Red  Were vital signs taken at a resting state? Yes  Focused Assessment No change from prior assessment  Does the patient meet 2 or more of the SIRS criteria? No  MEWS guidelines implemented *See Row Information* Yes  Treat  MEWS Interventions Escalated (See documentation below)  Nausea relieved by Antiemetic  Patients response to intervention Effective  Take Vital Signs  Increase Vital Sign Frequency  Red: Q 1hr X 4 then Q 4hr X 4, if remains red, continue Q 4hrs  Escalate  MEWS: Escalate Red: discuss with charge nurse/RN and provider, consider discussing with RRT  Notify: Charge Nurse/RN  Name of Charge Nurse/RN Notified Beverlee Nims RN  Date Charge Nurse/RN Notified 02/07/22  Time Charge Nurse/RN Notified 1230  Provider Notification  Provider Name/Title Dr. Sloan Leiter  Date Provider Notified 02/07/22  Time Provider Notified 1230  Method of Notification Page  Notification Reason Other (Comment)  Provider response No new orders  Date of Provider Response 02/07/22  Time of Provider Response 1232  Document  Patient Outcome Other (Comment)  Progress note created (see row info) Yes  Assess: SIRS CRITERIA  SIRS Temperature  0  SIRS Pulse 1  SIRS Respirations  1  SIRS WBC 0  SIRS Score Sum  2

## 2022-02-07 NOTE — Progress Notes (Signed)
   02/07/22 1000  Assess: MEWS Score  BP (!) 91/54  MAP (mmHg) 67  Pulse Rate (!) 104  ECG Heart Rate (!) 107  Resp (!) 23  SpO2 93 %  Assess: MEWS Score  MEWS Temp 0  MEWS Systolic 1  MEWS Pulse 1  MEWS RR 1  MEWS LOC 0  MEWS Score 3  MEWS Score Color Yellow  Assess: if the MEWS score is Yellow or Red  Were vital signs taken at a resting state? Yes  Focused Assessment No change from prior assessment  Does the patient meet 2 or more of the SIRS criteria? No  MEWS guidelines implemented *See Row Information* No, vital signs rechecked  Treat  MEWS Interventions Escalated (See documentation below)  Pain Scale 0-10  Pain Score 3  Pain Type Chronic pain  Nausea relieved by Antiemetic  Take Vital Signs  Increase Vital Sign Frequency  Yellow: Q 2hr X 2 then Q 4hr X 2, if remains yellow, continue Q 4hrs  Escalate  MEWS: Escalate Yellow: discuss with charge nurse/RN and consider discussing with provider and RRT  Notify: Charge Nurse/RN  Name of Charge Nurse/RN Notified Systems analyst  Date Charge Nurse/RN Notified 02/07/22  Time Charge Nurse/RN Notified 1100  Document  Patient Outcome Other (Comment)  Progress note created (see row info) Yes  Assess: SIRS CRITERIA  SIRS Temperature  0  SIRS Pulse 1  SIRS Respirations  1  SIRS WBC 0  SIRS Score Sum  2

## 2022-02-07 NOTE — Progress Notes (Signed)
PROGRESS NOTE        PATIENT DETAILS Name: Ryan [name] Age: 89 y.o. Sex: male Date of Birth: 02-26-1931 Admit Date: 02/02/2022 Admitting Physician Rhetta Mura, DO BBC:[id number], Rocco Pauls, NP  Brief Summary: Patient is a 89 y.o.  male with history of chronic HFpEF, right-sided heart failure, ILD, PAF, COPD, non-Hodgkin's lymphoma-s/p radiation therapy now on remission-presented with decompensated heart failure and AKI.  Significant events: 1/2>> admit to [facility]-decompensated heart failure 1/5>> several episodes of vomiting/diarrhea 1/6>> no vomiting/diarrhea resolved-diet advanced 1/7>> vomiting reoccurred.  Antiemetics ordered.  Significant studies: 1/2>> CXR: Prominent bilateral pulmonary interstitial opacities. 1/3>> echo: EF 94-50%, grade 2 diastolic dysfunction.  Significant microbiology data: 1/3>> COVID/influenza/RSV PCR: Negative 1/3>> urine culture: < 10,000 colonies/mL-insignificant growth  Procedures: None  Consults: Cardiology  Subjective: Seen earlier this morning-no major issues overnight-actually said that he slept somewhat.  Was eating breakfast this morning when I first saw him.  Wanted to go home.  However later this morning-informed by RN that he started having a few vomiting spells again.  Objective: Vitals: Blood pressure 120/69, pulse (!) 102, temperature 97.9 F (36.6 C), temperature source Oral, resp. rate 20, height 5' 5" (1.651 m), weight 79.1 kg, SpO2 94 %.   Exam: Gen Exam:Alert awake-not in any distress HEENT:atraumatic, normocephalic Chest: B/L clear to auscultation anteriorly CVS:S1S2 regular Abdomen:soft non tender, non distended Extremities:++edema-some mild erythema persists. Neurology: Non focal Skin: no rash  Pertinent Labs/Radiology:    Latest Ref Rng & Units 02/06/2022   12:45 AM 02/05/2022    8:00 AM 02/05/2022    7:59 AM  CBC  WBC 4.0 - 10.5 K/uL 10.6     Hemoglobin 13.0 - 17.0 g/dL 9.5   9.9  10.2   Hematocrit 39.0 - 52.0 % 32.1  29.0  30.0   Platelets 150 - 400 K/uL 382       Lab Results  Component Value Date   NA 141 02/07/2022   K 4.4 02/07/2022   CL 102 02/07/2022   CO2 29 02/07/2022      Assessment/Plan: Acute on chronic HFpEF RV failure from pulmonary hypertension Volume status improved-RHC 1/5 showed normal filling pressures.   No longer on IV Lasix-has been transitioned back to torsemide.  Continue midodrine/Toprol  Pulmonary hypertension Thought to be due to [facility] and ILD Remains on Uptravi and riociguat RHC on 1/5 showed normal filling pressures and mild pulmonary hypertension.  Vomiting/diarrhea Occurred on 1/5-unclear etiology-perhaps a viral syndrome-managed with supportive care-symptoms resolved as of 1/6. Unfortunately-has had recurrence of vomiting on 1/7-continue antiemetics-abdominal exam is benign.  Question if he has some sort of gastroparesis as this occurred after he finished his breakfast.  Will monitor him closely-if vomiting continues-may need imaging.  AKI on CKD stage IIIb AKI hemodynamically mediated Creatinine close to baseline-continue to monitor closely.  Hypokalemia Due to diuretic Repleted.  Complicated UTI Possible bilateral lower extremity cellulitis versus erythema from venous congestion in the setting of edema Nontoxic-appearing-afebrile Urine cultures negative but still has some erythema Given diagnostic uncertainty-reasonable to treat with 5 days of antibiotics-transition to Keflex for 1 additional day on 5/6. Edema has improved with improvement in edema-only minimal erythema persists.  PAF Sinus rhythm On metoprolol/Eliquis.  Normocytic anemia Likely due to CKD Watch closely.  Chronic hypoxic respiratory failure on home O2 ILD Appears stable-remains on 2 L of oxygen  BPH Continue Proscar No signs of urinary retention-felt significantly good urine output overnight.  History of non-Hodgkin's lymphoma-s/p  radiation therapy Currently in remission  GERD/hiatal hernia Continue PPI  Restless leg syndrome Continue Requip  Insomnia Patient requesting Xanax-apparently takes it daily but not on his medication list. Spoke with spouse on 1/5-she gives him a 0.25 mg of Xanax nightly from her own supply-this has been ongoing for 6 weeks. Continue as needed Xanax for now.  Obesity: Estimated body mass index is 29.02 kg/m as calculated from the following:   Height as of this encounter: _0  (1.651 m).   Weight as of this encounter: 79.1 kg.   Code status:   Code Status: DNR   DVT Prophylaxis: SCD's Start: 02/05/22 0822 apixaban (ELIQUIS) tablet 2.5 mg Start: 02/05/22 0815 SCDs Start: 02/03/22 0759 SCDs Start: 02/03/22 0542 apixaban (ELIQUIS) tablet 2.5 mg Eliquis  Family Communication: Spouse-Jane-[phone] -left VM 1/7   Disposition Plan: Status is: Inpatient Remains inpatient appropriate because: Severity of illness   Planned Discharge Destination:Home health   Diet: Diet Order             Diet 2 gram sodium Room service appropriate? Yes; Fluid consistency: Thin; Fluid restriction: 1200 mL Fluid  Diet effective now                     Antimicrobial agents: Anti-infectives (From admission, onward)    Start     Dose/Rate Route Frequency Ordered Stop   02/06/22 1145  cephALEXin (KEFLEX) capsule 500 mg        500 mg Oral Every 12 hours 02/06/22 1059 02/06/22 2213   02/03/22 0515  cefTRIAXone (ROCEPHIN) 1 g in sodium chloride 0.9 % 100 mL IVPB  Status:  Discontinued        1 g 200 mL/hr over 30 Minutes Intravenous Every 24 hours 02/03/22 0501 02/06/22 1058        MEDICATIONS: Scheduled Meds:  apixaban  2.5 mg Oral BID   atorvastatin  40 mg Oral q1800   escitalopram  10 mg Oral Daily   ferrous sulfate  325 mg Oral Daily   finasteride  5 mg Oral Daily   gabapentin  100 mg Oral QHS   melatonin  5 mg Oral QHS   metoprolol succinate  12.5 mg Oral Daily    midodrine  10 mg Oral TID WC   potassium chloride SA  60 mEq Oral Daily   Riociguat  2.5 mg Oral TID   rOPINIRole  2 mg Oral QID   Selexipag  1,600 mcg Oral Q12H   And   Selexipag  400 mcg Oral Q12H   sodium chloride flush  3 mL Intravenous Q12H   sodium chloride flush  3 mL Intravenous Q12H   sodium chloride flush  3 mL Intravenous Q12H   torsemide  100 mg Oral BID   Continuous Infusions:  sodium chloride     promethazine (PHENERGAN) injection (IM or IVPB)     PRN Meds:.sodium chloride, acetaminophen **OR** acetaminophen, acetaminophen, ALPRAZolam, fluticasone, loperamide, menthol-cetylpyridinium, ondansetron (ZOFRAN) IV, promethazine (PHENERGAN) injection (IM or IVPB), sodium chloride, sodium chloride flush, traMADol   I have personally reviewed following labs and imaging studies  LABORATORY DATA: CBC: Recent Labs  Lab 02/02/22 1209 02/03/22 0630 02/04/22 0038 02/05/22 0759 02/05/22 0800 02/06/22 0045  WBC 7.6 7.3 10.7*  --   --  10.6*  HGB 9.0* 9.0* 10.2* 10.2* 9.9* 9.5*  HCT 30.0* 29.2* 33.5* 30.0* 29.0* 32.1*  MCV  86.2 84.4 84.8  --   --  86.8  PLT 350 347 405*  --   --  382     Basic Metabolic Panel: Recent Labs  Lab 02/03/22 0630 02/04/22 0038 02/04/22 1418 02/05/22 0109 02/05/22 0759 02/05/22 0800 02/06/22 0045 02/07/22 0025  NA 140 141 140 142 144 144 143 141  K 2.9* 2.9* 3.3* 4.5 4.1 4.1 4.7 4.4  CL 94* 95* 96* 100  --   --  106 102  CO2 _0 --   --  27 29  GLUCOSE 84 155* 171* 108*  --   --  125* 97  BUN 98* 76* 64* 57*  --   --  63* 63*  CREATININE 1.82* 1.54* 1.50* 1.36*  --   --  1.65* 1.71*  CALCIUM 6.6* 7.2* 7.1* 7.2*  --   --  7.8* 8.1*  MG 2.0  --   --   --   --   --   --   --      GFR: Estimated Creatinine Clearance: 28.9 mL/min (A) (by C-G formula based on SCr of 1.71 mg/dL (H)).  Liver Function Tests: Recent Labs  Lab 02/03/22 0630  AST 10*  ALT 9  ALKPHOS 103  BILITOT 0.6  PROT 6.4*  ALBUMIN 3.0*    No results  for input(s): "LIPASE", "AMYLASE" in the last 168 hours. No results for input(s): "AMMONIA" in the last 168 hours.  Coagulation Profile: No results for input(s): "INR", "PROTIME" in the last 168 hours.  Cardiac Enzymes: No results for input(s): "CKTOTAL", "CKMB", "CKMBINDEX", "TROPONINI" in the last 168 hours.  BNP (last 3 results) No results for input(s): "PROBNP" in the last 8760 hours.  Lipid Profile: No results for input(s): "CHOL", "HDL", "LDLCALC", "TRIG", "CHOLHDL", "LDLDIRECT" in the last 72 hours.  Thyroid Function Tests: No results for input(s): "TSH", "T4TOTAL", "FREET4", "T3FREE", "THYROIDAB" in the last 72 hours.  Anemia Panel: No results for input(s): "VITAMINB12", "FOLATE", "FERRITIN", "TIBC", "IRON", "RETICCTPCT" in the last 72 hours.  Urine analysis:    Component Value Date/Time   COLORURINE YELLOW 02/02/2022 1245   APPEARANCEUR HAZY (A) 02/02/2022 1245   LABSPEC 1.009 02/02/2022 1245   LABSPEC 1.025 06/28/2013 1321   PHURINE 5.0 02/02/2022 1245   GLUCOSEU NEGATIVE 02/02/2022 1245   GLUCOSEU Negative 06/28/2013 1321   HGBUR NEGATIVE 02/02/2022 1245   BILIRUBINUR NEGATIVE 02/02/2022 1245   BILIRUBINUR Negative 06/28/2013 1321   KETONESUR NEGATIVE 02/02/2022 1245   PROTEINUR NEGATIVE 02/02/2022 1245   UROBILINOGEN 1.0 01/04/2014 2156   UROBILINOGEN 0.2 06/28/2013 1321   NITRITE NEGATIVE 02/02/2022 1245   LEUKOCYTESUR LARGE (A) 02/02/2022 1245   LEUKOCYTESUR Negative 06/28/2013 1321    Sepsis Labs: Lactic Acid, Venous    Component Value Date/Time   LATICACIDVEN 0.6 10/21/2020 0255    MICROBIOLOGY: Recent Results (from the past 240 hour(s))  Urine Culture     Status: Abnormal   Collection Time: 02/03/22  4:58 AM   Specimen: Urine, Clean Catch  Result Value Ref Range Status   Specimen Description URINE, CLEAN CATCH  Final   Special Requests NONE  Final   Culture (A)  Final    <10,000 COLONIES/mL INSIGNIFICANT GROWTH Performed at [facility], [place] [address] [place], [place] [address]    Report Status 02/04/2022 FINAL  Final  Resp panel by RT-PCR (RSV, Flu A&B, Covid) Urine, Clean Catch     Status: None   Collection Time: 02/03/22  8:04  AM   Specimen: Urine, Clean Catch; Nasal Swab  Result Value Ref Range Status   SARS Coronavirus 2 by RT PCR NEGATIVE NEGATIVE Final    Comment: (NOTE) SARS-CoV-2 target nucleic acids are NOT DETECTED.  The SARS-CoV-2 RNA is generally detectable in upper respiratory specimens during the acute phase of infection. The lowest concentration of SARS-CoV-2 viral copies this assay can detect is 138 copies/mL. A negative result does not preclude SARS-Cov-2 infection and should not be used as the sole basis for treatment or other patient management decisions. A negative result may occur with  improper specimen collection/handling, submission of specimen other than nasopharyngeal swab, presence of viral mutation(s) within the areas targeted by this assay, and inadequate number of viral copies(<138 copies/mL). A negative result must be combined with clinical observations, patient history, and epidemiological information. The expected result is Negative.  Fact Sheet for Patients:  EntrepreneurPulse.com.au  Fact Sheet for Healthcare Providers:  IncredibleEmployment.be  This test is no t yet approved or cleared by the [place] FDA and  has been authorized for detection and/or diagnosis of SARS-CoV-2 by FDA under an Emergency Use Authorization (EUA). This EUA will remain  in effect (meaning this test can be used) for the duration of the COVID-19 declaration under Section 564(b)(1) of the Act, 21 U.S.C.section 360bbb-3(b)(1), unless the authorization is terminated  or revoked sooner.       Influenza A by PCR NEGATIVE NEGATIVE Final   Influenza B by PCR NEGATIVE NEGATIVE Final    Comment: (NOTE) The Xpert Xpress SARS-CoV-2/FLU/RSV plus assay is  intended as an aid in the diagnosis of influenza from Nasopharyngeal swab specimens and should not be used as a sole basis for treatment. Nasal washings and aspirates are unacceptable for Xpert Xpress SARS-CoV-2/FLU/RSV testing.  Fact Sheet for Patients: EntrepreneurPulse.com.au  Fact Sheet for Healthcare Providers: IncredibleEmployment.be  This test is not yet approved or cleared by the [place] FDA and has been authorized for detection and/or diagnosis of SARS-CoV-2 by FDA under an Emergency Use Authorization (EUA). This EUA will remain in effect (meaning this test can be used) for the duration of the COVID-19 declaration under Section 564(b)(1) of the Act, 21 U.S.C. section 360bbb-3(b)(1), unless the authorization is terminated or revoked.     Resp Syncytial Virus by PCR NEGATIVE NEGATIVE Final    Comment: (NOTE) Fact Sheet for Patients: EntrepreneurPulse.com.au  Fact Sheet for Healthcare Providers: IncredibleEmployment.be  This test is not yet approved or cleared by the [place] FDA and has been authorized for detection and/or diagnosis of SARS-CoV-2 by FDA under an Emergency Use Authorization (EUA). This EUA will remain in effect (meaning this test can be used) for the duration of the COVID-19 declaration under Section 564(b)(1) of the Act, 21 U.S.C. section 360bbb-3(b)(1), unless the authorization is terminated or revoked.  Performed at [facility], [place] [address], [place], [place] [address]     RADIOLOGY STUDIES/RESULTS: No results found.   LOS: 4 days   Oren Binet, MD  [facility]    To contact the attending provider between 7A-7P or the covering provider during after hours 7P-7A, please log into the web site [email or web address] and access using universal [place] password for that web site. If you do not have the password, please call the hospital  operator.  02/07/2022, 10:12 AM

## 2022-02-08 DIAGNOSIS — J9621 Acute and chronic respiratory failure with hypoxia: Secondary | ICD-10-CM | POA: Diagnosis not present

## 2022-02-08 DIAGNOSIS — I5033 Acute on chronic diastolic (congestive) heart failure: Secondary | ICD-10-CM | POA: Diagnosis not present

## 2022-02-08 DIAGNOSIS — I9589 Other hypotension: Secondary | ICD-10-CM | POA: Diagnosis not present

## 2022-02-08 DIAGNOSIS — I480 Paroxysmal atrial fibrillation: Secondary | ICD-10-CM | POA: Diagnosis not present

## 2022-02-08 LAB — CBC
HCT: 28.1 % — ABNORMAL LOW (ref 39.0–52.0)
Hemoglobin: 8.2 g/dL — ABNORMAL LOW (ref 13.0–17.0)
MCH: 25.5 pg — ABNORMAL LOW (ref 26.0–34.0)
MCHC: 29.2 g/dL — ABNORMAL LOW (ref 30.0–36.0)
MCV: 87.5 fL (ref 80.0–100.0)
Platelets: 350 10*3/uL (ref 150–400)
RBC: 3.21 MIL/uL — ABNORMAL LOW (ref 4.22–5.81)
RDW: 16.5 % — ABNORMAL HIGH (ref 11.5–15.5)
WBC: 8.7 10*3/uL (ref 4.0–10.5)
nRBC: 0 % (ref 0.0–0.2)

## 2022-02-08 LAB — BASIC METABOLIC PANEL
Anion gap: 10 (ref 5–15)
BUN: 63 mg/dL — ABNORMAL HIGH (ref 8–23)
CO2: 30 mmol/L (ref 22–32)
Calcium: 8.1 mg/dL — ABNORMAL LOW (ref 8.9–10.3)
Chloride: 101 mmol/L (ref 98–111)
Creatinine, Ser: 1.6 mg/dL — ABNORMAL HIGH (ref 0.61–1.24)
GFR, Estimated: 41 mL/min — ABNORMAL LOW (ref >60)
Glucose, Bld: 100 mg/dL — ABNORMAL HIGH (ref 70–99)
Potassium: 4.3 mmol/L (ref 3.5–5.1)
Sodium: 141 mmol/L (ref 135–145)

## 2022-02-08 MED ORDER — METOLAZONE 2.5 MG PO TABS
2.5000 mg | ORAL_TABLET | ORAL | Status: DC
  Administered 2022-02-08: 2.5 mg via ORAL
  Filled 2022-02-08: qty 1

## 2022-02-08 MED ORDER — ONDANSETRON 4 MG PO TBDP: 4.0000 mg | ORAL_TABLET | Freq: Three times a day (TID) | ORAL | 0 refills | Status: DC | PRN

## 2022-02-08 MED ORDER — ONDANSETRON HCL 4 MG/2ML IJ SOLN: 4.0000 mg | Freq: Three times a day (TID) | INTRAMUSCULAR | Status: DC

## 2022-02-08 MED ORDER — MIDODRINE HCL 5 MG PO TABS: 10.0000 mg | ORAL_TABLET | Freq: Three times a day (TID) | ORAL | 11 refills | Status: DC

## 2022-02-08 NOTE — Discharge Summary (Signed)
PATIENT DETAILS Name: Ryan [name] Age: 89 y.o. Sex: male Date of Birth: 1931-03-09 MRN: [id number]. Admitting Physician: Rhetta Mura, DO GMW:[id number], Rocco Pauls, NP  Admit Date: 02/02/2022 Discharge date: 02/08/2022  Recommendations for Outpatient Follow-up:  Follow up with PCP in 1-2 weeks Please obtain CMP/CBC in one week Please ensure follow-up with CHF clinic.  Admitted From:  Home  Disposition: Home health   Discharge Condition: fair  CODE STATUS:   Code Status: DNR   Diet recommendation:  Diet Order             Diet heart healthy/carb modified Room service appropriate? Yes; Fluid consistency: Thin; Fluid restriction: 1200 mL Fluid  Diet effective now           Diet - low sodium heart healthy                    Brief Summary: Patient is a 89 y.o.  male with history of chronic HFpEF, right-sided heart failure, ILD, PAF, COPD, non-Hodgkin's lymphoma-s/p radiation therapy now on remission-presented with decompensated heart failure and AKI.   Significant events: 1/2>> admit to [facility]-decompensated heart failure 1/5>> several episodes of vomiting/diarrhea 1/6>> no vomiting/diarrhea resolved-diet advanced 1/7>> vomiting reoccurred.  Antiemetics ordered.   Significant studies: 1/2>> CXR: Prominent bilateral pulmonary interstitial opacities. 1/3>> echo: EF 66-44%, grade 2 diastolic dysfunction.   Significant microbiology data: 1/3>> COVID/influenza/RSV PCR: Negative 1/3>> urine culture: < 10,000 colonies/mL-insignificant growth   Procedures: None   Consults: Cardiology  Brief Hospital Course: Acute on chronic HFpEF RV failure from pulmonary hypertension Volume status improved-RHC 1/5 showed normal filling pressures.   No longer on IV Lasix-has been transitioned back to torsemide.  Continue midodrine/Toprol Per patient/family-patient is extremely poor vision-and lives mostly alone at ALF-he apparently misses quite a few doses of his  diuretics-likely the etiology for his decompensated heart failure.  Family/patient have made arrangement with his independent living facility for more support.  He will be discharged with home health services as well.   Pulmonary hypertension Thought to be due to [facility] and ILD Remains on Uptravi and riociguat RHC on 1/5 showed normal filling pressures and mild pulmonary hypertension.   Vomiting/diarrhea Occurred on 1/5-unclear etiology-perhaps a viral syndrome-managed with supportive care-vomiting briefly reoccurred on 1/7 but overall much better. Abdominal exam is benign Diet slowly being advanced but should be able to discharge home today as he looks stable without any vomiting overnight.  Continue as needed antiemetics post discharge.   AKI on CKD stage IIIb AKI hemodynamically mediated Creatinine close to baseline-continue to monitor closely.   Hypokalemia Due to diuretic Repleted.   Complicated UTI Possible bilateral lower extremity cellulitis versus erythema from venous congestion in the setting of edema Nontoxic-appearing-afebrile Urine cultures negative but still has some erythema Given diagnostic uncertainty-he was treated with 5 days of empiric antibiotics-initially on Rocephin-then subsequently transition to Keflex. Edema has improved with improvement in edema-only minimal erythema persists.   PAF Sinus rhythm On metoprolol/Eliquis.   Normocytic anemia Likely due to CKD Watch closely.   Chronic hypoxic respiratory failure on home O2 ILD Appears stable-remains on 2 L of oxygen   BPH Continue Proscar No signs of urinary retention-felt significantly good urine output overnight.   History of non-Hodgkin's lymphoma-s/p radiation therapy Currently in remission   GERD/hiatal hernia Continue PPI   Restless leg syndrome Continue Requip   Insomnia Patient requesting Xanax-apparently takes it daily but not on his medication list. Spoke with spouse on 1/5-she gives  him a 0.25 mg of Xanax nightly from her own supply-this has been ongoing for 6 weeks. Continue as needed Xanax for now.  Defer further to PCP.   Obesity: Estimated body mass index is 29.02 kg/m as calculated from the following:   Height as of this encounter: _0  (1.651 m).   Weight as of this encounter: 79.1 kg.   Discharge Diagnoses:  Principal Problem:   Acute on chronic diastolic (congestive) heart failure (HCC) Active Problems:   Acute on chronic respiratory failure with hypoxia (HCC)   Hypokalemia   Acute kidney injury superimposed on chronic kidney disease (HCC)   Urinary tract infection   Cellulitis of leg   Hypochromic anemia   Chronic hypotension   COPD (HCC)   Hyperlipidemia   Paroxysmal atrial fibrillation ([place])   History of non-Hodgkin's lymphoma   Discharge Instructions:  Activity:  As tolerated with Full fall precautions use walker/cane & assistance as needed   Discharge Instructions     (HEART FAILURE PATIENTS) Call MD:  Anytime you have any of the following symptoms: 1) 3 pound weight gain in 24 hours or 5 pounds in 1 week 2) shortness of breath, with or without a dry hacking cough 3) swelling in the hands, feet or stomach 4) if you have to sleep on extra pillows at night in order to breathe.   Complete by: As directed    Call MD for:  persistant nausea and vomiting   Complete by: As directed    Diet - low sodium heart healthy   Complete by: As directed    Increase activity slowly   Complete by: As directed       Allergies as of 02/08/2022       Reactions   Pollen Extract-tree Extract Other (See Comments)   "HEADACHES, TIRED, DRAINAGE FROM SINUSES"   Feraheme [ferumoxytol] Other (See Comments)   Chest pain, facial flushing, shortness of breath   Molds & Smuts Other (See Comments)   Also dust mites causes sinus infections, h/a etc.        Medication List     TAKE these medications    acetaminophen 500 MG tablet Commonly known as:  TYLENOL Take 500 mg by mouth every 6 (six) hours as needed for mild pain or moderate pain.   Adempas 2.5 MG Tabs Generic drug: Riociguat Take 2.5 mg by mouth 3 (three) times daily.   atorvastatin 40 MG tablet Commonly known as: LIPITOR Take 1 tablet (40 mg total) by mouth daily at 6 PM.   cetirizine 10 MG tablet Commonly known as: ZYRTEC Take 10 mg by mouth every evening.   cyanocobalamin 1000 MCG/ML injection Commonly known as: VITAMIN B12 Inject 1,000 mcg into the muscle every 30 (thirty) days.   Eliquis 2.5 MG Tabs tablet Generic drug: apixaban TAKE ONE TABLET TWICE DAILY What changed: how much to take   escitalopram 10 MG tablet Commonly known as: LEXAPRO Take 10 mg by mouth in the morning.   finasteride 5 MG tablet Commonly known as: PROSCAR Take 5 mg by mouth every evening.   Flonase Sensimist 27.5 MCG/SPRAY nasal spray Generic drug: fluticasone Place 1 spray into the nose daily as needed for rhinitis or allergies.   gabapentin 100 MG capsule Commonly known as: NEURONTIN TAKE ONE CAPSULE AT BEDTIME   guaiFENesin 600 MG 12 hr tablet Commonly known as: MUCINEX Take 600 mg by mouth in the morning.   Iron (Ferrous Sulfate) 325 (65 Fe) MG Tabs Take 325 mg by  mouth daily. What changed: when to take this   metolazone 2.5 MG tablet Commonly known as: ZAROXOLYN Take 1 tablet (2.5 mg total) by mouth once a week.   metoprolol succinate 25 MG 24 hr tablet Commonly known as: TOPROL-XL TAKE ONE-HALF TABLET AT BEDTIME What changed: See the new instructions.   midodrine 5 MG tablet Commonly known as: PROAMATINE Take 2 tablets (10 mg total) by mouth 3 (three) times daily with meals. What changed: how much to take   omeprazole 20 MG capsule Commonly known as: PRILOSEC Take 1 capsule (20 mg total) by mouth 2 (two) times daily before a meal. What changed: when to take this   ondansetron 4 MG disintegrating tablet Commonly known as: ZOFRAN-ODT Take 1 tablet (4 mg  total) by mouth every 8 (eight) hours as needed for nausea or vomiting.   OXYGEN Inhale 2 L/min into the lungs. As needed and at night   potassium chloride SA 20 MEQ tablet Commonly known as: KLOR-CON M Take 3 tablets (60 mEq total) by mouth daily. With an additional 20 meq once a week for metolazone What changed:  how much to take when to take this   PreserVision AREDS 2 Caps Take 1 capsule by mouth 2 (two) times daily.   rOPINIRole 2 MG tablet Commonly known as: REQUIP TAKE ONE TABLET THREE TIMES DAILY   Selexipag 400 MCG Tabs Take 400 mcg by mouth 2 (two) times daily. 0800 and 1900 What changed: Another medication with the same name was removed. Continue taking this medication, and follow the directions you see here.   Uptravi 1600 MCG Tabs Generic drug: Selexipag Take 1,600 mcg by mouth 2 (two) times daily. 0800 and 1900 What changed: Another medication with the same name was removed. Continue taking this medication, and follow the directions you see here.   torsemide 100 MG tablet Commonly known as: DEMADEX TAKE ONE TABLET TWICE DAILY   traMADol 50 MG tablet Commonly known as: ULTRAM Take 50 mg by mouth every 6 (six) hours as needed for moderate pain or severe pain.        Follow-up Information     [facility] Follow up.   Specialty: Cardiology Why: 02/22/22 at 2:00 PM   Hospital Follow-up in the [facility] (Dr. Claris Gladden office) Contact information: [address] [id number] [place] [address] [phone]        Roetta Sessions, NP. Schedule an appointment as soon as possible for a visit in 1 week(s).   Specialty: Nurse Practitioner Contact information: [address] [place] [address] [phone]                Allergies  Allergen Reactions   Pollen Extract-Tree Extract Other (See Comments)    "HEADACHES, TIRED, DRAINAGE FROM  SINUSES"   Feraheme [Ferumoxytol] Other (See Comments)    Chest pain, facial flushing, shortness of breath   Molds & Smuts Other (See Comments)    Also dust mites causes sinus infections, h/a etc.     Other Procedures/Studies: CARDIAC CATHETERIZATION  Result Date: 02/05/2022 1. Filling pressures in normal range. 2. Mild pulmonary hypertension, looks like predominantly pulmonary venous hypertension. 3. Preserved cardiac output.   ECHOCARDIOGRAM COMPLETE  Result Date: 02/03/2022    ECHOCARDIOGRAM REPORT   Patient Name:   ARPAN ESKELSON Date of Exam: 02/03/2022 Medical Rec #:  [id number]       Height:  65.0 in Accession #:    [id number]      Weight:       187.0 lb Date of Birth:  1931-09-04      BSA:          1.922 m Patient Age:    83 years        BP:           94/54 mmHg Patient Gender: M               HR:           89 bpm. Exam Location:  Inpatient Procedure: 2D Echo, Cardiac Doppler and Color Doppler Indications:    CHF-Acute Diastolic W26.37  History:        Patient has prior history of Echocardiogram examinations, most                 recent 08/17/2021. CHF, Stroke and COPD; Risk                 Factors:Hypertension, Sleep Apnea, Diabetes and Dyslipidemia.                 CKD stage III.  Sonographer:    Ronny Flurry Referring Phys: [id number] RONDELL A SMITH IMPRESSIONS  1. Left ventricular ejection fraction, by estimation, is 65 to 70%. The left ventricle has normal function. The left ventricle has no regional wall motion abnormalities. Left ventricular diastolic parameters are consistent with Grade II diastolic dysfunction (pseudonormalization). Elevated left atrial pressure.  2. Right ventricular systolic function was not well visualized. The right ventricular size is not well visualized. Tricuspid regurgitation signal is inadequate for assessing PA pressure.  3. Left atrial size was mildly dilated.  4. The mitral valve is grossly normal. No evidence of mitral valve regurgitation.  5. The  aortic valve is tricuspid. Aortic valve regurgitation is not visualized. Aortic valve sclerosis is present, with no evidence of aortic valve stenosis.  6. The inferior vena cava is normal in size with greater than 50% respiratory variability, suggesting right atrial pressure of 3 mmHg.  7. Cannot exclude a small PFO. Comparison(s): No significant change from prior study. FINDINGS  Left Ventricle: Left ventricular ejection fraction, by estimation, is 65 to 70%. The left ventricle has normal function. The left ventricle has no regional wall motion abnormalities. The left ventricular internal cavity size was small. Suboptimal image quality limits for assessment of left ventricular hypertrophy. Left ventricular diastolic parameters are consistent with Grade II diastolic dysfunction (pseudonormalization). Elevated left atrial pressure. Right Ventricle: The right ventricular size is not well visualized. Right vetricular wall thickness was not well visualized. Right ventricular systolic function was not well visualized. Tricuspid regurgitation signal is inadequate for assessing PA pressure. Left Atrium: Left atrial size was mildly dilated. Right Atrium: Right atrial size was normal in size. Pericardium: There is no evidence of pericardial effusion. Mitral Valve: The mitral valve is grossly normal. Mild to moderate mitral annular calcification. No evidence of mitral valve regurgitation. Tricuspid Valve: The tricuspid valve is normal in structure. Tricuspid valve regurgitation is not demonstrated. No evidence of tricuspid stenosis. Aortic Valve: The aortic valve is tricuspid. There is mild aortic valve annular calcification. Aortic valve regurgitation is not visualized. Aortic valve sclerosis is present, with no evidence of aortic valve stenosis. Aortic valve mean gradient measures  7.0 mmHg. Aortic valve peak gradient measures 12.1 mmHg. Aortic valve area, by VTI measures 2.50 cm. Pulmonic Valve: The pulmonic valve was not  well visualized. Pulmonic valve  regurgitation is not visualized. Aorta: The aortic root and ascending aorta are structurally normal, with no evidence of dilitation. Venous: The inferior vena cava is normal in size with greater than 50% respiratory variability, suggesting right atrial pressure of 3 mmHg. IAS/Shunts: Cannot exclude a small PFO.  LEFT VENTRICLE PLAX 2D LVOT diam:     2.00 cm   Diastology LV SV:         77        LV e' medial:    6.20 cm/s LV SV Index:   40        LV E/e' medial:  19.8 LVOT Area:     3.14 cm  LV e' lateral:   8.59 cm/s                          LV E/e' lateral: 14.3  RIGHT VENTRICLE RV S prime:     17.30 cm/s TAPSE (M-mode): 1.5 cm LEFT ATRIUM             Index        RIGHT ATRIUM           Index LA Vol (A2C):   64.3 ml 33.45 ml/m  RA Area:     11.00 cm LA Vol (A4C):   50.3 ml 26.16 ml/m  RA Volume:   17.10 ml  8.89 ml/m LA Biplane Vol: 58.8 ml 30.59 ml/m  AORTIC VALVE AV Area (Vmax):    2.65 cm AV Area (Vmean):   2.57 cm AV Area (VTI):     2.50 cm AV Vmax:           174.00 cm/s AV Vmean:          119.000 cm/s AV VTI:            0.309 m AV Peak Grad:      12.1 mmHg AV Mean Grad:      7.0 mmHg LVOT Vmax:         146.50 cm/s LVOT Vmean:        97.400 cm/s LVOT VTI:          0.246 m LVOT/AV VTI ratio: 0.80  AORTA Ao Root diam: 3.50 cm Ao Asc diam:  2.80 cm MITRAL VALVE MV Area (PHT): 3.65 cm     SHUNTS MV Decel Time: 208 msec     Systemic VTI:  0.25 m MV E velocity: 123.00 cm/s  Systemic Diam: 2.00 cm MV A velocity: 104.00 cm/s MV E/A ratio:  1.18 Rudean Haskell MD Electronically signed by Rudean Haskell MD Signature Date/Time: 02/03/2022/1:24:10 PM    Final    DG Chest 2 View  Result Date: 02/02/2022 CLINICAL DATA:  Shortness of breath EXAM: CHEST - 2 VIEW COMPARISON:  CXR 01/21/22 FINDINGS: No pleural effusion. No pneumothorax. Persistent large hiatal hernia. Loop recorder device in place. There are prominent bilateral interstitial opacities, nonspecific, possibly  related to pulmonary venous congestion or atypical infection. Left basilar atelectasis. Visualized upper abdomen is notable for gas distended loops of bowel. Possible mild height loss of the L2 vertebral body, but this is incompletely assessed. There is a metallic density projecting over the T10 vertebral body level, this is nonspecific, and new compared to 2021. IMPRESSION: 1. Prominent bilateral interstitial opacities, nonspecific, possibly related to pulmonary venous congestion or atypical infection. 2. Possible mild height loss of the L2 vertebral body, but this is incompletely assessed. Consider dedicated imaging of the lumbar spine. 3. Metallic density projecting  over the T10 vertebral body level is nonspecific and may be external. Correlate with physical exam. Electronically Signed   By: Marin Roberts M.D.   On: 02/02/2022 13:15   CUP PACEART REMOTE DEVICE CHECK  Result Date: 01/11/2022 ILR summary report received. Battery status OK. Normal device function. No new symptom, tachy, brady, or pause episodes. 461 new AF episodes, overall controlled rates, burden 24.8%, previous remote 5.7%, Eliquis. Monthly summary reports and ROV/PRN LA    TODAY-DAY OF DISCHARGE:  Subjective:   Hal Morales today has no headache,no chest abdominal pain,no new weakness tingling or numbness, feels much better wants to go home today.  Objective:   Blood pressure 111/62, pulse 90, temperature 97.9 F (36.6 C), temperature source Oral, resp. rate 20, height _0  (1.651 m), weight 78.1 kg, SpO2 100 %.  Intake/Output Summary (Last 24 hours) at 02/08/2022 0913 Last data filed at 02/08/2022 0835 Gross per 24 hour  Intake 1025.28 ml  Output 2050 ml  Net -1024.72 ml   Filed Weights   02/05/22 0122 02/07/22 0600 02/08/22 0216  Weight: 80 kg 79.1 kg 78.1 kg    Exam: Awake Alert, Oriented *3, No new F.N deficits, Normal affect [place].AT,PERRAL Supple Neck,No JVD, No cervical lymphadenopathy appriciated.   Symmetrical Chest wall movement, Good air movement bilaterally, CTAB RRR,No Gallops,Rubs or new Murmurs, No Parasternal Heave +ve B.Sounds, Abd Soft, Non tender, No organomegaly appriciated, No rebound -guarding or rigidity. No Cyanosis, Clubbing or edema, No new Rash or bruise   PERTINENT RADIOLOGIC STUDIES: No results found.   PERTINENT LAB RESULTS: CBC: Recent Labs    02/06/22 0045 02/08/22 0029  WBC 10.6* 8.7  HGB 9.5* 8.2*  HCT 32.1* 28.1*  PLT 382 350   CMET CMP     Component Value Date/Time   NA 141 02/08/2022 0029   NA 139 12/10/2019 1007   NA 143 06/26/2015 0916   K 4.3 02/08/2022 0029   K 3.8 06/26/2015 0916   CL 101 02/08/2022 0029   CL 105 04/07/2012 0925   CO2 30 02/08/2022 0029   CO2 32 (H) 06/26/2015 0916   GLUCOSE 100 (H) 02/08/2022 0029   GLUCOSE 102 06/26/2015 0916   GLUCOSE 86 04/07/2012 0925   BUN 63 (H) 02/08/2022 0029   BUN 56 (H) 12/10/2019 1007   BUN 14.0 06/26/2015 0916   CREATININE 1.60 (H) 02/08/2022 0029   CREATININE 1.0 06/26/2015 0916   CALCIUM 8.1 (L) 02/08/2022 0029   CALCIUM 8.9 06/26/2015 0916   PROT 6.4 (L) 02/03/2022 0630   PROT 6.6 12/10/2019 1007   PROT 6.2 (L) 06/26/2015 0916   ALBUMIN 3.0 (L) 02/03/2022 0630   ALBUMIN 4.4 12/10/2019 1007   ALBUMIN 3.5 06/26/2015 0916   AST 10 (L) 02/03/2022 0630   AST 17 06/26/2015 0916   ALT 9 02/03/2022 0630   ALT 15 06/26/2015 0916   ALKPHOS 103 02/03/2022 0630   ALKPHOS 87 06/26/2015 0916   BILITOT 0.6 02/03/2022 0630   BILITOT <0.2 12/10/2019 1007   BILITOT 0.50 06/26/2015 0916   GFRNONAA 41 (L) 02/08/2022 0029   GFRAA 32 (L) 12/10/2019 1007    GFR Estimated Creatinine Clearance: 30.7 mL/min (A) (by C-G formula based on SCr of 1.6 mg/dL (H)). No results for input(s): "LIPASE", "AMYLASE" in the last 72 hours. No results for input(s): "CKTOTAL", "CKMB", "CKMBINDEX", "TROPONINI" in the last 72 hours. Invalid input(s): "POCBNP" No results for input(s): "DDIMER" in the last  72 hours. No results for input(s): "HGBA1C" in the  last 72 hours. No results for input(s): "CHOL", "HDL", "LDLCALC", "TRIG", "CHOLHDL", "LDLDIRECT" in the last 72 hours. No results for input(s): "TSH", "T4TOTAL", "T3FREE", "THYROIDAB" in the last 72 hours.  Invalid input(s): "FREET3" No results for input(s): "VITAMINB12", "FOLATE", "FERRITIN", "TIBC", "IRON", "RETICCTPCT" in the last 72 hours. Coags: No results for input(s): "INR" in the last 72 hours.  Invalid input(s): "PT" Microbiology: Recent Results (from the past 240 hour(s))  Urine Culture     Status: Abnormal   Collection Time: 02/03/22  4:58 AM   Specimen: Urine, Clean Catch  Result Value Ref Range Status   Specimen Description URINE, CLEAN CATCH  Final   Special Requests NONE  Final   Culture (A)  Final    <10,000 COLONIES/mL INSIGNIFICANT GROWTH Performed at [facility], [address], [place], [place] [address]    Report Status 02/04/2022 FINAL  Final  Resp panel by RT-PCR (RSV, Flu A&B, Covid) Urine, Clean Catch     Status: None   Collection Time: 02/03/22  8:04 AM   Specimen: Urine, Clean Catch; Nasal Swab  Result Value Ref Range Status   SARS Coronavirus 2 by RT PCR NEGATIVE NEGATIVE Final    Comment: (NOTE) SARS-CoV-2 target nucleic acids are NOT DETECTED.  The SARS-CoV-2 RNA is generally detectable in upper respiratory specimens during the acute phase of infection. The lowest concentration of SARS-CoV-2 viral copies this assay can detect is 138 copies/mL. A negative result does not preclude SARS-Cov-2 infection and should not be used as the sole basis for treatment or other patient management decisions. A negative result may occur with  improper specimen collection/handling, submission of specimen other than nasopharyngeal swab, presence of viral mutation(s) within the areas targeted by this assay, and inadequate number of viral copies(<138 copies/mL). A negative result must be combined with clinical  observations, patient history, and epidemiological information. The expected result is Negative.  Fact Sheet for Patients:  EntrepreneurPulse.com.au  Fact Sheet for Healthcare Providers:  IncredibleEmployment.be  This test is no t yet approved or cleared by the [place] FDA and  has been authorized for detection and/or diagnosis of SARS-CoV-2 by FDA under an Emergency Use Authorization (EUA). This EUA will remain  in effect (meaning this test can be used) for the duration of the COVID-19 declaration under Section 564(b)(1) of the Act, 21 U.S.C.section 360bbb-3(b)(1), unless the authorization is terminated  or revoked sooner.       Influenza A by PCR NEGATIVE NEGATIVE Final   Influenza B by PCR NEGATIVE NEGATIVE Final    Comment: (NOTE) The Xpert Xpress SARS-CoV-2/FLU/RSV plus assay is intended as an aid in the diagnosis of influenza from Nasopharyngeal swab specimens and should not be used as a sole basis for treatment. Nasal washings and aspirates are unacceptable for Xpert Xpress SARS-CoV-2/FLU/RSV testing.  Fact Sheet for Patients: EntrepreneurPulse.com.au  Fact Sheet for Healthcare Providers: IncredibleEmployment.be  This test is not yet approved or cleared by the [place] FDA and has been authorized for detection and/or diagnosis of SARS-CoV-2 by FDA under an Emergency Use Authorization (EUA). This EUA will remain in effect (meaning this test can be used) for the duration of the COVID-19 declaration under Section 564(b)(1) of the Act, 21 U.S.C. section 360bbb-3(b)(1), unless the authorization is terminated or revoked.     Resp Syncytial Virus by PCR NEGATIVE NEGATIVE Final    Comment: (NOTE) Fact Sheet for Patients: EntrepreneurPulse.com.au  Fact Sheet for Healthcare Providers: IncredibleEmployment.be  This test is not yet approved or  cleared by  the [place] and has been authorized for detection and/or diagnosis of SARS-CoV-2 by FDA under an Emergency Use Authorization (EUA). This EUA will remain in effect (meaning this test can be used) for the duration of the COVID-19 declaration under Section 564(b)(1) of the Act, 21 U.S.C. section 360bbb-3(b)(1), unless the authorization is terminated or revoked.  Performed at [facility], [place] [address] [place], [place] [address]     FURTHER DISCHARGE INSTRUCTIONS:  Get Medicines reviewed and adjusted: Please take all your medications with you for your next visit with your Primary MD  Laboratory/radiological data: Please request your Primary MD to go over all hospital tests and procedure/radiological results at the follow up, please ask your Primary MD to get all Hospital records sent to his/her office.  In some cases, they will be blood work, cultures and biopsy results pending at the time of your discharge. Please request that your primary care M.D. goes through all the records of your hospital data and follows up on these results.  Also Note the following: If you experience worsening of your admission symptoms, develop shortness of breath, life threatening emergency, suicidal or homicidal thoughts you must seek medical attention immediately by calling 911 or calling your MD immediately  if symptoms less severe.  You must read complete instructions/literature along with all the possible adverse reactions/side effects for all the Medicines you take and that have been prescribed to you. Take any new Medicines after you have completely understood and accpet all the possible adverse reactions/side effects.   Do not drive when taking Pain medications or sleeping medications (Benzodaizepines)  Do not take more than prescribed Pain, Sleep and Anxiety Medications. It is not advisable to combine anxiety,sleep and pain medications without talking with your primary care  practitioner  Special Instructions: If you have smoked or chewed Tobacco  in the last 2 yrs please stop smoking, stop any regular Alcohol  and or any Recreational drug use.  Wear Seat belts while driving.  Please note: You were cared for by a hospitalist during your hospital stay. Once you are discharged, your primary care physician will handle any further medical issues. Please note that NO REFILLS for any discharge medications will be authorized once you are discharged, as it is imperative that you return to your primary care physician (or establish a relationship with a primary care physician if you do not have one) for your post hospital discharge needs so that they can reassess your need for medications and monitor your lab values.  Total Time spent coordinating discharge including counseling, education and face to face time equals greater than 30 minutes.  SignedOren Binet 02/08/2022 9:13 AM

## 2022-02-08 NOTE — Progress Notes (Signed)
Physical Therapy Treatment Patient Details Name: Ryan [name] MRN: [id number] DOB: 1931/05/19 Today's Date: 02/08/2022   History of Present Illness Pt is an 89 y.o. male who presented 02/02/22 with SOB and lower extremity edema. Admitted with acute on chronic HFpEF and UTI. PMH: asthma, barrett's esophagus, CHF, COPD, DM2, HLD, HTN, non-Hodgkin's lymphoma, melanoma    PT Comments    Pt reports less nausea this date, therefore tolerating increased mobility today. Focused session on bedroom mobility due to pt need to have BM and on dynamic standing tasks at sink. Recommend use of RW/rollator for stability with gait as he was noted to be unstable without UE support. Pt is also requiring 2L of supplemental O2 at rest and with mobility to maintain sats >90%. Will continue to follow acutely. Current recommendations remain appropriate.     Recommendations for follow up therapy are one component of a multi-disciplinary discharge planning process, led by the attending physician.  Recommendations may be updated based on patient status, additional functional criteria and insurance authorization.  Follow Up Recommendations  Home health PT (at [facility])     Assistance Recommended at Discharge PRN  Patient can return home with the following Assistance with cooking/housework;Assist for transportation   Equipment Recommendations  None recommended by PT    Recommendations for Other Services       Precautions / Restrictions Precautions Precautions: Fall;Other (comment) Precaution Comments: watch SpO2 Restrictions Weight Bearing Restrictions: No     Mobility  Bed Mobility               General bed mobility comments: Pt sitting up EOB upon arrival    Transfers Overall transfer level: Needs assistance Equipment used: Rolling walker (2 wheels) Transfers: Sit to/from Stand Sit to Stand: Supervision, Min guard           General transfer comment: Min guard-supervision for safety to come  to stand, 1x from EOB and 1x from commode.    Ambulation/Gait Ambulation/Gait assistance: Supervision Gait Distance (Feet): 20 Feet (x2 bouts of ~18-20 ft each bout) Assistive device: Rolling walker (2 wheels), None Gait Pattern/deviations: Step-through pattern, Decreased stride length Gait velocity: reduced Gait velocity interpretation: <1.31 ft/sec, indicative of household ambulator   General Gait Details: Pt with slow, but fairly steady gait within room to go use bathroom. No LOB, min guard-supervision for safety and line management. Pt taking a few steps end of session without UE support, instability noted but no LOB   Stairs             Wheelchair Mobility    Modified Rankin (Stroke Patients Only)       Balance Overall balance assessment: Needs assistance Sitting-balance support: No upper extremity supported, Feet supported Sitting balance-Leahy Scale: Good     Standing balance support: Bilateral upper extremity supported, During functional activity, Single extremity supported Standing balance-Leahy Scale: Poor Standing balance comment: Prefers 1 UE support for dynamic standing tasks at sink (>5 min), can take a couple steps without UE support but benefits from RW                            Cognition Arousal/Alertness: Awake/alert Behavior During Therapy: [facility] for tasks assessed/performed Overall Cognitive Status: Within Functional Limits for tasks assessed  Exercises      General Comments General comments (skin integrity, edema, etc.): SpO2 down to 82% on RA at rest, 94% on 2L with gait; encouraged 2-3L O2 utilization at d/c along with use of pulse ox      Pertinent Vitals/Pain Pain Assessment Pain Assessment: Faces Faces Pain Scale: No hurt Pain Intervention(s): Monitored during session    Home Living                          Prior Function            PT Goals  (current goals can now be found in the care plan section) Acute Rehab PT Goals Patient Stated Goal: to go home PT Goal Formulation: With patient Time For Goal Achievement: 02/19/22 Potential to Achieve Goals: Good Progress towards PT goals: Progressing toward goals    Frequency    Min 3X/week      PT Plan Current plan remains appropriate    Co-evaluation              AM-PAC PT "6 Clicks" Mobility   Outcome Measure  Help needed turning from your back to your side while in a flat bed without using bedrails?: A Little Help needed moving from lying on your back to sitting on the side of a flat bed without using bedrails?: A Little Help needed moving to and from a bed to a chair (including a wheelchair)?: A Little Help needed standing up from a chair using your arms (e.g., wheelchair or bedside chair)?: A Little Help needed to walk in hospital room?: A Little Help needed climbing 3-5 steps with a railing? : A Little 6 Click Score: 18    End of Session Equipment Utilized During Treatment: Oxygen Activity Tolerance: Patient tolerated treatment well Patient left: with call bell/phone within reach;in bed;with bed alarm set (sitting EOB to eat breakfast) Nurse Communication: Other (comment) (dark stool noted) PT Visit Diagnosis: Unsteadiness on feet (R26.81);Other abnormalities of gait and mobility (R26.89);Muscle weakness (generalized) (M62.81)     Time: 3582-5189 PT Time Calculation (min) (ACUTE ONLY): 25 min  Charges:  $Gait Training: 8-22 mins $Therapeutic Activity: 8-22 mins                     Moishe Spice, PT, DPT Acute Rehabilitation Services  Office: [place] 02/08/2022, 12:15 PM

## 2022-02-08 NOTE — Progress Notes (Signed)
SATURATION QUALIFICATIONS: (This note is used to comply with regulatory documentation for home oxygen)  Patient Saturations on Room Air at Rest = 82%  Patient Saturations on Room Air while Ambulating = N/A  Patient Saturations on 2 Liters of oxygen while Ambulating = 94%  Please briefly explain why patient needs home oxygen: Pt is requiring 2L of supplemental O2 to maintain sats >/= 94%.   Moishe Spice, PT, DPT Acute Rehabilitation Services  Office: [phone]

## 2022-02-08 NOTE — Progress Notes (Signed)
Discharge instructions discussed with patient, patients home meds received from pharmacy and given to patient.   Patient instructed on home medications, restrictions, and follow up appointments. Belongings gathered and sent with patient.   Patient discharged via wheelchair by PTAR.

## 2022-02-08 NOTE — Progress Notes (Signed)
02/08/22 1000  Mobility  Activity Ambulated with assistance to bathroom  Level of Assistance Minimal assist, patient does 75% or more  Assistive Device Front wheel walker  Distance Ambulated (ft) 20 ft  Activity Response Tolerated well  Mobility Referral Yes  $Mobility charge 1 Mobility   Mobility Specialist Progress Note  Pt requesting to use BR. Had no c/o pain. Returned to bed w/ all needs met and alarm on.   Lucious Groves Mobility Specialist  Please contact via SecureChat or Rehab office at [phone]

## 2022-02-08 NOTE — Care Management Important Message (Signed)
Important Message  Patient Details  Name: Ryan [name] MRN: [id number] Date of Birth: 01-31-1932   Medicare Important Message Given:  Yes     Shelda Altes 02/08/2022, 7:53 AM

## 2022-02-08 NOTE — Progress Notes (Signed)
   02/08/22 1130  Mobility  Activity Stood at bedside  Level of Assistance Minimal assist, patient does 75% or more  Assistive Device Front wheel walker  Distance Ambulated (ft) 2 ft  Activity Response Tolerated well  Mobility Referral Yes  $Mobility charge 1 Mobility   Mobility Specialist Progress Note  Pt requesting assistance to stand to use urinal. Had no c/o pain. Returned to bed w/ all needs met and alarm on.   Lucious Groves Mobility Specialist  Please contact via SecureChat or Rehab office at [phone]

## 2022-02-08 NOTE — Progress Notes (Addendum)
Patient ID: Ryan [name], male   DOB: Sep 24, 1931, 89 y.o.   MRN: [id number]     Advanced Heart Failure Rounding Note  PCP-Cardiologist: [name], MD   Subjective:    Feels much better today.   Remains admitted 2/2 recurrent emesis. Last episode yesterday.   Slept well, denies CP/SOB.   RHC Procedural Findings: Hemodynamics (mmHg) RA mean 6 RV 42/9 PA 45/22, mean 32 PCWP mean 14 Oxygen saturations: PA 70% AO 94% Cardiac Output (Fick) 7.49  Cardiac Index (Fick) 3.99 PVR 2.4 WU   Objective:   Weight Range: 78.1 kg Body mass index is 28.65 kg/m.   Vital Signs:   Temp:  [97.9 F (36.6 C)-98.1 F (36.7 C)] 97.9 F (36.6 C) (01/08 0320) Pulse Rate:  [85-104] 94 (01/08 0320) Resp:  [20-50] 22 (01/08 0320) BP: (91-120)/(51-72) 108/63 (01/08 0320) SpO2:  [93 %-100 %] 97 % (01/08 0320) Weight:  [78.1 kg] 78.1 kg (01/08 0216) Last BM Date : 02/07/22  Weight change: Filed Weights   02/05/22 0122 02/07/22 0600 02/08/22 0216  Weight: 80 kg 79.1 kg 78.1 kg    Intake/Output:   Intake/Output Summary (Last 24 hours) at 02/08/2022 0807 Last data filed at 02/08/2022 0300 Gross per 24 hour  Intake 785.28 ml  Output 1600 ml  Net -814.72 ml      Physical Exam    General:  chronically ill/elderly appearing.  No respiratory difficulty HEENT: normal Neck: supple. No JVD seen. Carotids 2+ bilat; no bruits. No lymphadenopathy or thyromegaly appreciated. Cor: PMI nondisplaced. Regular rate & rhythm. No rubs, gallops or murmurs. Lungs: clear Abdomen: soft, nontender, nondistended. No hepatosplenomegaly. No bruits or masses. Good bowel sounds. Extremities: no cyanosis, clubbing, rash, +1-2 BLE edema  Neuro: alert & oriented x 3, cranial nerves grossly intact. moves all 4 extremities w/o difficulty. Affect pleasant.   Telemetry   NSR 80s 1-4PVCs/hr(Personally reviewed)    EKG    No new EKG to review  Labs    CBC Recent Labs    02/06/22 0045 02/08/22 0029   WBC 10.6* 8.7  HGB 9.5* 8.2*  HCT 32.1* 28.1*  MCV 86.8 87.5  PLT 382 086   Basic Metabolic Panel Recent Labs    02/07/22 0025 02/08/22 0029  NA 141 141  K 4.4 4.3  CL 102 101  CO2 29 30  GLUCOSE 97 100*  BUN 63* 63*  CREATININE 1.71* 1.60*  CALCIUM 8.1* 8.1*   Liver Function Tests No results for input(s): "AST", "ALT", "ALKPHOS", "BILITOT", "PROT", "ALBUMIN" in the last 72 hours.  No results for input(s): "LIPASE", "AMYLASE" in the last 72 hours. Cardiac Enzymes No results for input(s): "CKTOTAL", "CKMB", "CKMBINDEX", "TROPONINI" in the last 72 hours.  BNP: BNP (last 3 results) Recent Labs    07/28/21 1439 11/05/21 1229 02/02/22 1209  BNP 40.7 61.0 187.7*    ProBNP (last 3 results) No results for input(s): "PROBNP" in the last 8760 hours.   D-Dimer No results for input(s): "DDIMER" in the last 72 hours. Hemoglobin A1C No results for input(s): "HGBA1C" in the last 72 hours. Fasting Lipid Panel No results for input(s): "CHOL", "HDL", "LDLCALC", "TRIG", "CHOLHDL", "LDLDIRECT" in the last 72 hours. Thyroid Function Tests No results for input(s): "TSH", "T4TOTAL", "T3FREE", "THYROIDAB" in the last 72 hours.  Invalid input(s): "FREET3"  Other results:   Imaging    No results found.   Medications:     Scheduled Medications:  apixaban  2.5 mg Oral BID   atorvastatin  40 mg Oral q1800   escitalopram  10 mg Oral Daily   ferrous sulfate  325 mg Oral Daily   finasteride  5 mg Oral Daily   gabapentin  100 mg Oral QHS   melatonin  5 mg Oral QHS   metoprolol succinate  12.5 mg Oral Daily   midodrine  10 mg Oral TID WC   ondansetron (ZOFRAN) IV  4 mg Intravenous TID AC   potassium chloride SA  60 mEq Oral Daily   Riociguat  2.5 mg Oral TID   rOPINIRole  2 mg Oral QID   Selexipag  1,600 mcg Oral Q12H   And   Selexipag  400 mcg Oral Q12H   sodium chloride flush  3 mL Intravenous Q12H   sodium chloride flush  3 mL Intravenous Q12H   sodium chloride  flush  3 mL Intravenous Q12H   torsemide  100 mg Oral BID    Infusions:  sodium chloride     promethazine (PHENERGAN) injection (IM or IVPB) Stopped (02/07/22 1125)    PRN Medications: sodium chloride, acetaminophen **OR** acetaminophen, acetaminophen, ALPRAZolam, fluticasone, loperamide, menthol-cetylpyridinium, polyvinyl alcohol, promethazine (PHENERGAN) injection (IM or IVPB), sodium chloride, sodium chloride flush, traMADol    Patient Profile   89 y/o male w/ chronic diastolic heart failure w/ prominent RV dysfunction in setting of pulmonary HTN, PAF, h/o CVA, OSA intolerant of CPAP, ILD and carotid artery stenosis, admitted w/ acute on chronic RV failure and UTI.    Assessment/Plan   1. Acute on Chronic diastolic CHF: Suspect with significant component of RV failure from pulmonary hypertension.  Echo in 6/20 with EF 60-65%, RV not well-visualized but PASP 59 mmHg.  Echo (3/21) with EF 60-65%, severe LVH, normal RV, PASP 28 mmHg.  PYP scan was not suggestive of TTR cardiac amyloidosis. Echo in 6/22 showed EF 65-70% with mild LVH, normal RV, IVC normal, PASP estimated 29 mmHg.  Markedly volume overloaded in the setting of poor compliance w/ home diuretic regimen. NYHA Class IIIb-IV. Echo this admission with EF 65-70% with D-shaped septum, mild RV dilation/dysfunction, dilated IVC.  He has diuresed about 11 lbs with IV Lasix (overall down 15lbs), and RHC 1/5 showed normal filling pressures with mild pulmonary venous hypertension.  - Continue torsemide 100 mg bid.  He should continue metolazone once weekly as prior to admission.  Will use same dose of diuretics as pre-admission, think he was missing meds. Start 2.5 weekly metolazone today.  - Unable to wear compression stockings/UNNA boots or elevate legs due to RLS.  - Continue midodrine 10 mg tid to promote higher BP in setting of RV failure. - Continue Toprol XL 12.5 mg qhs. - Continue Riociquat 2.5 mg tid - trouble following fluid  restriction 2. AKI on CKD Stage 3b: B/l Scr ~1.7. Scr 2.1 on admit. UA + for UTI.  Cr 1.6 today.  3. Pulmonary hypertension: Patient thought to have [place] out of proportion to his lung disease (ILD, NSIP versus UIP). RHC in 2/20 showed mean PA pressure 41, PVR 5.1 WU, CI 3. Echo in 6/20 with RV reportedly ok => I reviewed and do not think that RV was well-visualized.  Patient did not tolerate Adcirca due to edema/?cognitive effects.  RHC was done again in 6/20, showed moderate pulmonary arterial hypertension with PVR 5.35 (comparable to prior).  Serological workup for PH was negative. Patient started ambrisentan 5 mg and developed significant edema/volume overload despite a high dose of torsemide. He is now on Uptravi + riociguat.  Echo 7/23 showed normal RV size/systolic function and PA systolic pressure estimate was normal.  RHC today showed mild primarily pulmonary venous hypertension.  - He will stay off ambrisentan (? trigger for volume overload).  - As above, he did not tolerate tadalafil.  - He is at 2000 mcg bid Uptravi.  Will continue this dose.   - Continue riociguat at goal dose 2.5 mg tid.  - updated echo as above  4. Atrial fibrillation: Paroxysmal, noted on ILR. NSR on admission. - Continue apixaban 2.5 mg bid (dosed for age, renal dysfunction).  - Toprol XL 12.5 mg daily  5. OSA: Cannot tolerate CPAP.  6. CVA: In 3/21, may have been atrial fibrillation-related.  On apixaban now.  7. Carotid stenosis: Carotid duplex 04/23: 40-59% right ICA stenosis, no significant stenosis on the left - Continue statin. - Repeat study 4/24  8. Interstitial lung disease:  Has seen pulmonary, ILD seems mild and stable.  9. UTI: By UA.   - on IV rocephin per IM   Based on RHC, he appears ready for d/c from AHF standpoint.  He should resume his pre-admission medication regimen but needs to be compliant with all meds.  Has f/u in clinic.  Has not had further emesis, pending primary team eval.   Length  of Stay: [place], NP  02/08/2022, 8:07 AM  Advanced Heart Failure Team Pager [phone] (M-F; 7a - 5p)  Please contact [facility] for night-coverage after hours (5p -7a ) and weekends on [email or web address]  Patient seen with NP, agree with the above note.   No further nausea/vomiting.  Feels good this morning, wants to go home.    General: NAD Neck: No JVD, no thyromegaly or thyroid nodule.  Lungs: Clear to auscultation bilaterally with normal respiratory effort. CV: Nondisplaced PMI.  Heart regular S1/S2, no S3/S4, no murmur.  1+ chronic edema to knees.  Abdomen: Soft, nontender, no hepatosplenomegaly, no distention.  Skin: Intact without lesions or rashes.  Neurologic: Alert and oriented x 3.  Psych: Normal affect. Extremities: No clubbing or cyanosis.  HEENT: Normal.   He is down 15 lbs, volume status looks stable.  Creatinine stable at 1.6.  He has not been regular about taking his pm medications.  We have discussed medication compliance with him, ex-wife to help as well.  He will go home on same regimen as prior to admission, just needs to take medications as ordered.   We will arrange followup.   Loralie Champagne 02/08/2022 9:30 AM

## 2022-02-08 NOTE — TOC Initial Note (Addendum)
Transition of Care [facility]) - Initial/Assessment Note    Patient Details  Name: Ryan [name] MRN: [id number] Date of Birth: August 15, 1931  Transition of Care [facility]) CM/SW Contact:    Erenest Rasher, RN Phone Number: [phone] 02/08/2022, 9:41 AM  Clinical Narrative:      Spoke to pt at bedside. States he was ready for dc and felt well enough to go [place] via PTAR.              HF TOC CM spoke to pt's wife, Ryan [name]. States she would like PTAR for transportation. States she pt has aide services through [facility] that assist him at home. Contacted [place] RN, Kristen, # [phone], faxed # [phone] HH orders and DC summary. Wife wanted CM to confirm with pt that he felt well enough to dc. She is currently out of town and unable to be with patient. Per wife, pt has LTC policy that would provide $180 of care per day. She is trying to restart his benefits. [place].   Expected Discharge Plan: [place] Barriers to Discharge: No Barriers Identified   Patient Goals and CMS Choice Patient states their goals for this hospitalization and ongoing recovery are:: wants to feel better CMS Medicare.gov Compare Post Acute Care list provided to:: Patient Choice offered to / list presented to : Patient      Expected Discharge Plan and Services   Discharge Planning Services: CM Consult Post Acute Care Choice: [place] arrangements for the past 2 months: [place] Expected Discharge Date: 02/08/22                         HH Arranged: PT, RN, OT, Nurse's Aide          Prior Living Arrangements/Services Living arrangements for the past 2 months: [place] Lives with:: Self Patient language and need for interpreter reviewed:: Yes Do you feel safe going back to the place where you live?: Yes      Need for Family Participation in Patient Care: No (Comment) Care giver support  system in place?: Yes (comment) Current home services: DME (scooter, scale, rolling walker) Criminal Activity/Legal Involvement Pertinent to Current Situation/Hospitalization: No - Comment as needed  Activities of Daily Living Home Assistive Devices/Equipment: None ADL Screening (condition at time of admission) Patient's cognitive ability adequate to safely complete daily activities?: Yes Is the patient deaf or have difficulty hearing?: No Does the patient have difficulty seeing, even when wearing glasses/contacts?: Yes Does the patient have difficulty concentrating, remembering, or making decisions?: No Patient able to express need for assistance with ADLs?: Yes Does the patient have difficulty dressing or bathing?: No Independently performs ADLs?: Yes (appropriate for developmental age) Does the patient have difficulty walking or climbing stairs?: No Weakness of Legs: None Weakness of Arms/Hands: None  Permission Sought/Granted Permission sought to share information with : Case Manager, Family Supports, PCP Permission granted to share information with : Yes, Verbal Permission Granted  Share Information with NAME: Ryan [name]     Permission granted to share info w Relationship: wife  Permission granted to share info w Contact Information: [phone]  Emotional Assessment Appearance:: Appears stated age Attitude/Demeanor/Rapport: Engaged Affect (typically observed): Accepting Orientation: : Oriented to Self, Oriented to Place, Oriented to  Time, Oriented to Situation   Psych Involvement: No (comment)  Admission  diagnosis:  Acute on chronic diastolic congestive heart failure (HCC) [I50.33] Urinary tract infection without hematuria, site unspecified [N39.0] Acute on chronic diastolic (congestive) heart failure (HCC) [I50.33] Patient Active Problem List   Diagnosis Date Noted   Acute on chronic diastolic (congestive) heart failure ([place]) 02/03/2022   Acute on chronic  respiratory failure with hypoxia (HCC) 02/03/2022   Urinary tract infection 02/03/2022   Cellulitis of leg 02/03/2022   Chronic hypotension 02/03/2022   Paroxysmal atrial fibrillation ([place]) 02/03/2022   History of non-Hodgkin's lymphoma 02/03/2022   Hypochromic anemia    GI bleed 10/17/2020   B12 deficiency 05/28/2019   Cerebrovascular accident (CVA) due to embolism of precerebral artery ([place]) 05/28/2019   Diabetic polyneuropathy associated with type 2 diabetes mellitus ([place]) 05/28/2019   Acute CVA (cerebrovascular accident) ([place]) 04/24/2019   Diabetes mellitus type 2 in nonobese ([place]) 04/24/2019   Acute respiratory failure with hypoxia and hypercarbia ([place]) 09/20/2018   CKD (chronic kidney disease) stage 3, GFR 30-59 ml/min (HCC) 09/20/2018   Lethargy 45/36/4680   Acute metabolic encephalopathy 32/01/2481   Chronic diastolic CHF (congestive heart failure) ([place]) 09/20/2018   Acute kidney injury superimposed on chronic kidney disease ([place]) 09/10/2018   Pulmonary hypertension, unspecified ([place]) 09/10/2018   Multifocal atrial tachycardia 09/10/2018   CHF exacerbation ([place]) 09/07/2018   Leg pain 09/07/2018   Hyperkalemia 07/26/2018   Bilateral lower extremity edema 02/02/2017   Pulmonary fibrosis ([place]) 11/22/2016   Blurry vision, bilateral 10/26/2016   COPD ([place]) 07/05/2016   Exertional dyspnea 09/10/2015   Fatigue/malaise 08/19/2015   Asthma/COPD 02/12/2015   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   Chronic diastolic heart failure ([place]) 01/06/2014   Hypoxemia 01/05/2014   Essential hypertension 01/05/2014   Hypokalemia 01/05/2014   Urinary retention 01/05/2014   Hypoxia    Lumbar radiculopathy 12/31/2013   RLS (restless legs syndrome) 09/21/2012   Chronic bilateral low back pain with bilateral sciatica 06/15/2012   Abdominal pain, unspecified site 05/19/2012   Unspecified deficiency anemia 04/07/2012   Melanoma ([place])    Non Hodgkin's lymphoma ([place])    Periaortic  lymphadenopathy 02/16/2011   Barrett's esophagus 07/09/2010   Personal history of colonic polyps 05/28/2010   History of IBS 05/28/2010   Diverticulosis of colon (without mention of hemorrhage) 05/28/2010   Arthritis involving multiple sites 05/28/2010   Wheat intolerance 05/28/2010   Chronic venous insufficiency 08/19/2009   Peripheral edema 08/19/2009   EUSTACHIAN TUBE DYSFUNCTION, BILATERAL 12/23/2008   ERECTILE DYSFUNCTION, ORGANIC 08/01/2008   Allergic rhinitis with a nonallergic component 05/20/2008   VENTRAL HERNIA 02/12/2008   PALPITATIONS 12/25/2007   LACTOSE INTOLERANCE 10/17/2007   Hyperlipidemia 09/14/2006   BPH (benign prostatic hyperplasia) 09/14/2006   OSTEOARTHRITIS 09/14/2006   HEART MURMUR, HX OF 09/14/2006   PCP:  Roetta Sessions, NP Pharmacy:   [facility] [place] [address] Phone: [phone] Fax: [place], [place] - [address] [place] [address] Phone: [phone] Fax: [phone]  [facility] [place] [address] [place] [address] Phone: [phone] Fax: [phone]  [facility] [place] [address] [address] Phone: [phone] Fax: [phone]     Social Determinants of Health (SDOH) Social History: SDOH Screenings   Food Insecurity: No Food Insecurity (02/08/2022)  Housing:  Low Risk  (02/08/2022)  Tobacco Use: Medium Risk (02/05/2022)   SDOH Interventions: Food Insecurity Interventions: Intervention Not Indicated Housing Interventions: Intervention Not Indicated   Readmission Risk Interventions     No data to display

## 2022-02-09 LAB — LIPOPROTEIN A (LPA): Lipoprotein (a): <8.4 nmol/L (ref <75.0)

## 2022-02-15 ENCOUNTER — Ambulatory Visit (INDEPENDENT_AMBULATORY_CARE_PROVIDER_SITE_OTHER): Payer: Medicare HMO

## 2022-02-15 ENCOUNTER — Telehealth (HOSPITAL_COMMUNITY): Payer: Self-pay | Admitting: Pharmacist

## 2022-02-15 DIAGNOSIS — I639 Cerebral infarction, unspecified: Secondary | ICD-10-CM | POA: Diagnosis not present

## 2022-02-15 NOTE — Telephone Encounter (Signed)
Received VM from patient that he had been out of his Uptravi and Adempas for 3 days. Stated he was scheduled to get the medication tomorrow. He was asking if he could restart the medications at his same dose or if he would have to start the titration over.   Called patient back but did not answer and VM box was full. Called his wife and instructed her that if he gets the medications tomorrow as scheduled he can resume previous dose of both medications. However, if the shipment takes longer, he can take Adempas 1.25 mg (1/2 tab) TID x7 days, then increase back to 2.5 mg TID. For the [facility], he can take 1600 mcg BID x7 days, then resume 2000 mcg BID. She expressed understanding and will inform patient.    Audry Riles, PharmD, BCPS, BCCP, CPP Heart Failure Clinic Pharmacist [phone]

## 2022-02-16 LAB — CUP PACEART REMOTE DEVICE CHECK
Date Time Interrogation Session: 20240112231012
Implantable Pulse Generator Implant Date: 20210325

## 2022-02-18 ENCOUNTER — Telehealth (HOSPITAL_COMMUNITY): Payer: Self-pay | Admitting: Cardiology

## 2022-02-18 NOTE — Telephone Encounter (Addendum)
Pt aware  [place] aware ([phone]

## 2022-02-18 NOTE — Telephone Encounter (Signed)
Pt returned call to report increase in nausea Diarrhea x 3 days Weight increased x 6 lbs   Offered appt with DM 1/19 and reports he already has appt 1/22  -will forward to provider as [facility]

## 2022-02-18 NOTE — Telephone Encounter (Signed)
He can take an extra dose of metolazone this week.

## 2022-02-18 NOTE — Telephone Encounter (Signed)
Pt left VM on triage line regarding weight increase x 6 lbs    Wife will try to reach pt and have him return call    No  answer with pts number

## 2022-02-18 NOTE — Progress Notes (Signed)
Carelink Summary Report / Loop Recorder

## 2022-02-22 ENCOUNTER — Encounter (HOSPITAL_COMMUNITY): Payer: Self-pay

## 2022-02-22 ENCOUNTER — Ambulatory Visit (HOSPITAL_COMMUNITY)
Admit: 2022-02-22 | Discharge: 2022-02-22 | Disposition: A | Discharge status: Home / Self Care | Payer: Medicare HMO | Attending: Physician Assistant | Admitting: Physician Assistant

## 2022-02-22 VITALS — BP 98/60 | HR 82 | Wt 177.8 lb

## 2022-02-22 DIAGNOSIS — Z9981 Dependence on supplemental oxygen: Secondary | ICD-10-CM | POA: Diagnosis not present

## 2022-02-22 DIAGNOSIS — Z91148 Patient's other noncompliance with medication regimen for other reason: Secondary | ICD-10-CM | POA: Diagnosis not present

## 2022-02-22 DIAGNOSIS — I272 Pulmonary hypertension, unspecified: Secondary | ICD-10-CM

## 2022-02-22 DIAGNOSIS — I2721 Secondary pulmonary arterial hypertension: Secondary | ICD-10-CM | POA: Insufficient documentation

## 2022-02-22 DIAGNOSIS — I480 Paroxysmal atrial fibrillation: Secondary | ICD-10-CM

## 2022-02-22 DIAGNOSIS — G4733 Obstructive sleep apnea (adult) (pediatric): Secondary | ICD-10-CM | POA: Diagnosis not present

## 2022-02-22 DIAGNOSIS — I5032 Chronic diastolic (congestive) heart failure: Secondary | ICD-10-CM | POA: Diagnosis not present

## 2022-02-22 DIAGNOSIS — N1832 Chronic kidney disease, stage 3b: Secondary | ICD-10-CM

## 2022-02-22 DIAGNOSIS — I130 Hypertensive heart and chronic kidney disease with heart failure and stage 1 through stage 4 chronic kidney disease, or unspecified chronic kidney disease: Secondary | ICD-10-CM | POA: Diagnosis not present

## 2022-02-22 DIAGNOSIS — Z79899 Other long term (current) drug therapy: Secondary | ICD-10-CM | POA: Diagnosis not present

## 2022-02-22 DIAGNOSIS — J849 Interstitial pulmonary disease, unspecified: Secondary | ICD-10-CM | POA: Insufficient documentation

## 2022-02-22 DIAGNOSIS — Z7901 Long term (current) use of anticoagulants: Secondary | ICD-10-CM | POA: Insufficient documentation

## 2022-02-22 DIAGNOSIS — E8770 Fluid overload, unspecified: Secondary | ICD-10-CM | POA: Insufficient documentation

## 2022-02-22 DIAGNOSIS — Z8572 Personal history of non-Hodgkin lymphomas: Secondary | ICD-10-CM | POA: Diagnosis not present

## 2022-02-22 LAB — COMPREHENSIVE METABOLIC PANEL
ALT: 13 U/L (ref 0–44)
AST: 18 U/L (ref 15–41)
Albumin: 3.3 g/dL — ABNORMAL LOW (ref 3.5–5.0)
Alkaline Phosphatase: 104 U/L (ref 38–126)
Anion gap: 13 (ref 5–15)
BUN: 89 mg/dL — ABNORMAL HIGH (ref 8–23)
CO2: 25 mmol/L (ref 22–32)
Calcium: 6.5 mg/dL — ABNORMAL LOW (ref 8.9–10.3)
Chloride: 95 mmol/L — ABNORMAL LOW (ref 98–111)
Creatinine, Ser: 2.09 mg/dL — ABNORMAL HIGH (ref 0.61–1.24)
GFR, Estimated: 30 mL/min — ABNORMAL LOW (ref >60)
Glucose, Bld: 125 mg/dL — ABNORMAL HIGH (ref 70–99)
Potassium: 3 mmol/L — ABNORMAL LOW (ref 3.5–5.1)
Sodium: 133 mmol/L — ABNORMAL LOW (ref 135–145)
Total Bilirubin: 0.5 mg/dL (ref 0.3–1.2)
Total Protein: 6.7 g/dL (ref 6.5–8.1)

## 2022-02-22 LAB — BRAIN NATRIURETIC PEPTIDE: B Natriuretic Peptide: 202.4 pg/mL — ABNORMAL HIGH (ref 0.0–100.0)

## 2022-02-22 NOTE — Progress Notes (Addendum)
PCP: Roetta Sessions, NP Cardiology: Dr. Aundra Dubin  89 y.o. with history of interstitial lung disease (?NSIP vs UIP) on home oxygen, pulmonary hypertension, diastolic CHF, and prior non-Hodgkins lymphoma (pelvic mass, s/p XRT) presents for followup of CHF and pulmonary hypertension.  Patient has history of reportedly mild ILD (NSIP vs UIP) followed by a pulmonologist at [facility] and pulmonary hypertension thought to be out of proportion lung disease followed by a physician at [facility].  Last CT chest was in 11/19, showing stable ILD (?UIP vs NSIP).  He had RHC in 2/20 with moderate PAH, PVR 5.1 WU.  He was tried on Adcirca but developed cognitive problems and worsening of peripheral edema so this was stopped.     Echo 06/20 showed normal LV size and systolic function, RV reportedly normal.  6/20 RHC showed normal filling pressures with moderate pulmonary hypertension, PVR 5.35 WU. After discharge from hospital, patient started on ambrisentan.  His weight steadily began to increase and he developed worsening lower extremity edema, we stopped ambrisentan. He has been able to tolerate Uptravi.  He was admitted in 3/21 with suspected CVA, MRI head showed punctate occipital lobe infarct, thought to be due to small vessel disease. ILR was implanted.  Echo in 3/21 showed EF 60-65%, severe LVH, normal RV.  PYP scan in 3/21 was not suggestive of TTR amyloidosis.  Subsequently, ILR has shown paroxysmal atrial fibrillation.   Echo in 6/22 showed EF 65-70% with mild LVH, normal RV, IVC normal, PASP estimated 29 mmHg.   Patient was admitted in 9/22 with upper GI bleed from bleeding gastric polyps.  He had polypectomy and has been restarted on Eliquis.   He was seen in the ER with atrial fibrillation in 12/22.    Echo in 7/23 showed EF 60-65%, RV normal, PASP estimation was not elevated, IVC normal.   Seen 10/23 for an acute visit when BP 70's and HR 44 at Neurology visit. Kidney function elevated and  metolazone decreased back to once a week. Midodrine added.  Patient admitted earlier this month RV failure and UTI. Had not been taking his diuretics as prescribed (especially PM doses). He was diuresed with IV lasix and restarted on home Torsemide and weekly metolazone. Echo during admit with EF 65-70%, D shaped septum, mildly dilated RV with mild RV dysfunction. RHC showed normal filling pressures and mild pulmonary venous hypertension.  He is here today for f/u. He is accompanied by his wife. Now has a home health agency that assists with administering meds. Currenlty receving afternoon dose of Torsemide at 8 pm and is up all night voiding. Lower extremity edema significantly improved. Still has some dyspnea with exertion. Now using O2 continuously. His weight was 169 lb at home at discharge, up to 175 lb last week. Took extra metolazone on 01/19 and weight down 2 lb the next day. He struggled with diarrhea, decreased appetite and nausea during recent admit and things he caught a GI illness. This is starting to improve.  BP soft today. Denies dizziness.   ECG (personally reviewed): SR with PACs, 76 bpm  6 minute walk (8/21): 122 m 6 minute walk (11/21): 244 m 6 minute walk (3/22): 59 m   PMH: 1. Non-Hodgkin's lymphoma: Pelvic involvement.  S/p radiation.  Thought to be in remission (Dr. Benay Spice).  2. HTN 3. PACs/PVCs 4. GERD: Barrett's esophagus.  5. 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  - High resolution CT chest 11/19: ILD, ?UIP pattern.  6. Pulmonary hypertension: Thought to be  out of proportion to underlying lung disease (ILD, ?NSIP vs UIP).   - RHC (2/20): mean RA 10, PASP 60 with mean 42, mean PCWP 11, CI 3, PVR 5.1 WU.  - Trial of Adcirca => failed due to ?memory worsening/cognitive affects and peripheral edema.  - Echo (6/20): EF 60-65%, mild LVH, RV reportedly normal, PASP 59 mmHg.  - RHC (6/20): mean RA 7, PA 50/24 mean 34, mean PCWP 12, CI 2.01, PAPI 3.7, PVR 5.35 WU - RF  negative, ANA negative, anti-SCL70 negative, anti-centromere negative.  - V/Q scan (8/20): No evidence for chronic PE.  - Echo (3/21): EF 60-65%, severe LVH, normal RV.  - Echo (6/22): EF 65-70% with mild LVH, normal RV, IVC normal, PASP estimated 29 mmHg. - Echo (7/23): EF 60-65%, RV normal, PASP estimation was not elevated, IVC normal.  7. CKD: Stage 3.   8. Wandering atrial pacemaker 9. OSA 10. PYP scan (3/21): H/CL ratio 1.0, grade 0.  11. CVA: MRI 3/21 with punctate occipital lobe infarct thought to be due to small vessel disease. - Carotid dopplers (3/21): 40-59% RICA stenosis.  - Carotid dopplers (4/22): 40-59% RICA stenosis.  - Carotid dopplers (4/23): 40-59% RICA stenosis.  12. Atrial fibrillation: Paroxysmal, noted on ILR.  13. GI bleeding 9/22 from gastric polyps.  14. 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[employer], [employer]  Tobacco Use   Smoking status: Former    Packs/day: 1.00    Years: 35.00    Total pack years: 35.00    Types: Pipe, Cigarettes    Quit date: 02/23/1992    Years since quitting: 30.0   Smokeless tobacco: Never   Tobacco comments:    quit 20 years ago  Vaping Use   Vaping Use: Never used  Substance and Sexual Activity   Alcohol use: Not Currently    Comment: 2 drinks daily scotch  AND WINE WITH SUPPER   Drug use: No   Sexual activity: Not on file  Other Topics Concern   Not on file  Social History Narrative   Married - second marriage   He has two children   Retired Horticulturist, commercial Professor   Currently teaches pottery making, 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[facility]   Former Smoker quit 12 years ago- smoked for 35 years   Alcohol use- not currently   Right handed   Social Determinants of Health    Financial Resource Strain: Low Risk  (02/08/2022)   Overall Financial Resource Strain (CARDIA)    Difficulty of Paying Living Expenses: Not hard at all  Food Insecurity: No Food Insecurity (02/08/2022)   Hunger Vital Sign    Worried About Running Out of Food in the Last Year: Never true    [place] in the Last Year: Never true  Transportation Needs: No Transportation Needs (02/08/2022)   PRAPARE - Hydrologist (Medical): No    Lack of Transportation (Non-Medical): No  Physical Activity: Not on file  Stress: Not on file  Social Connections: Not on file  Intimate Partner Violence: Not At Risk (02/08/2022)   Humiliation, Afraid, Rape, and Kick questionnaire    Fear of Current or Ex-Partner: No  Emotionally Abused: No    Physically Abused: No    Sexually Abused: No   Family History  Problem Relation Age of Onset   Dementia Mother    Heart disease Father        MI 63   Urticaria Father    Restless legs syndrome Father    Dementia Sister    Parkinson's disease Sister    ALS Sister    Prostate cancer Brother    Prostate cancer Paternal Uncle    Prostate cancer Paternal Uncle    Prostate cancer Paternal Uncle    Hyperlipidemia Other    Stroke Other    Hypertension Other    ADD / ADHD Other    Colon cancer Neg Hx    Allergic rhinitis Neg Hx    Asthma Neg Hx    Eczema Neg Hx    ROS: All systems reviewed and negative except as per HPI.   Current Outpatient Medications  Medication Sig Dispense Refill   acetaminophen (TYLENOL) 500 MG tablet Take 500 mg by mouth every 6 (six) hours as needed for mild pain or moderate pain.     atorvastatin (LIPITOR) 40 MG tablet Take 1 tablet (40 mg total) by mouth daily at 6 PM. 30 tablet 1   cyanocobalamin (,VITAMIN B-12,) 1000 MCG/ML injection Inject 1,000 mcg into the muscle every 30 (thirty) days.     ELIQUIS 2.5 MG TABS tablet TAKE ONE TABLET TWICE DAILY 60 tablet 11   escitalopram (LEXAPRO) 10 MG tablet  Take 10 mg by mouth in the morning.     finasteride (PROSCAR) 5 MG tablet Take 5 mg by mouth every evening.     FLONASE SENSIMIST 27.5 MCG/SPRAY nasal spray Place 1 spray into the nose daily as needed for rhinitis or allergies.      gabapentin (NEURONTIN) 100 MG capsule TAKE ONE CAPSULE AT BEDTIME 30 capsule 1   guaiFENesin (MUCINEX) 600 MG 12 hr tablet Take 600 mg by mouth in the morning.     Iron, Ferrous Sulfate, 325 (65 Fe) MG TABS Take 325 mg by mouth daily. 30 tablet 4   metolazone (ZAROXOLYN) 2.5 MG tablet Take 1 tablet (2.5 mg total) by mouth once a week. 12 tablet 3   metoprolol succinate (TOPROL-XL) 25 MG 24 hr tablet TAKE ONE-HALF TABLET AT BEDTIME 15 tablet 3   midodrine (PROAMATINE) 5 MG tablet Take 2 tablets (10 mg total) by mouth 3 (three) times daily with meals. 90 tablet 11   Multiple Vitamins-Minerals (PRESERVISION AREDS 2) CAPS Take 1 capsule by mouth 2 (two) times daily.     omeprazole (PRILOSEC) 20 MG capsule Take 1 capsule (20 mg total) by mouth 2 (two) times daily before a meal. 60 capsule 2   ondansetron (ZOFRAN-ODT) 4 MG disintegrating tablet Take 1 tablet (4 mg total) by mouth every 8 (eight) hours as needed for nausea or vomiting. 20 tablet 0   OXYGEN Inhale 2 L/min into the lungs. As needed and at night     potassium chloride (KLOR-CON) 20 MEQ packet Take 80 mEq by mouth daily.     Riociguat (ADEMPAS) 2.5 MG TABS Take 2.5 mg by mouth 3 (three) times daily. 90 tablet 11   rOPINIRole (REQUIP) 2 MG tablet TAKE ONE TABLET THREE TIMES DAILY 90 tablet 4   Selexipag (UPTRAVI) 1600 MCG TABS Take 1,600 mcg by mouth 2 (two) times daily. 0800 and 1900     Selexipag 400 MCG TABS Take 400 mcg by mouth 2 (two)  times daily. 0800 and 1900     torsemide (DEMADEX) 100 MG tablet TAKE ONE TABLET TWICE DAILY 180 tablet 3   traMADol (ULTRAM) 50 MG tablet Take 50 mg by mouth every 6 (six) hours as needed for moderate pain or severe pain.     No current facility-administered medications for  this encounter.   BP 98/60   Pulse 82   Wt 80.6 kg (177 lb 12.8 oz)   SpO2 98% Comment: 2 l n/c  BMI 29.59 kg/m   Wt Readings from Last 3 Encounters:  02/22/22 80.6 kg (177 lb 12.8 oz)  02/08/22 78.1 kg (172 lb 2.9 oz)  11/23/21 82.7 kg (182 lb 6.4 oz)   General:  Ambulated into clinic with shuffling gait.  HEENT: normal Neck: supple. no JVD. Carotids 2+ bilat; no bruits.  Cor: PMI nondisplaced. Regular rate & rhythm. No rubs, gallops or murmurs. Lungs: clear Abdomen: soft, nontender, nondistended.  Extremities: no cyanosis, clubbing, rash, 1-2+ edema Neuro: alert & orientedx3, cranial nerves grossly intact. moves all 4 extremities w/o difficulty. Affect pleasant   Assessment/Plan: 1.Chronic diastolic CHF: Suspect with significant component of RV failure from pulmonary hypertension.  Echo in 6/20 with EF 60-65%, RV not well-visualized but PASP 59 mmHg.  Echo (3/21) with EF 60-65%, severe LVH, normal RV, PASP 28 mmHg.  PYP scan was not suggestive of TTR cardiac amyloidosis. Echo in 6/22 showed EF 65-70% with mild LVH, normal RV, IVC normal, PASP estimated 29 mmHg.  Admit 01/24 with volume overload in setting of noncompliance with diuretics. Echo 01/24 with EF 65-70% with D-shaped septum, mild RV dilation/dysfunction, dilated IVC. RHC 1/5 showed normal filling pressures with mild pulmonary venous hypertension.  - NYHA III chronically. Weight up about 5 lb from discharge on home scale but but volume appears okay on exam. May actually be a bit dry with low BP, recent diarrhea and decreased appetite Continue torsemide 100 mg bid and metolazone 2.5 mg once weekly pending labs. May need to cut back dose - On midodrine 10 TID - No BP room for GDMT 2. CKD Stage 3b: BMET today. 3. Pulmonary hypertension: Patient thought to have [place] out of proportion to his lung disease (ILD, NSIP versus UIP). RHC in 2/20 showed mean PA pressure 41, PVR 5.1 WU, CI 3. Echo in 6/20 with RV reportedly ok => I  reviewed and do not think that RV was well-visualized.  Patient did not tolerate Adcirca due to edema/?cognitive effects.  RHC was done again in 6/20, showed moderate pulmonary arterial hypertension with PVR 5.35 (comparable to prior).  Serological workup for PH was negative. Patient started ambrisentan 5 mg and developed significant edema/volume overload despite a high dose of torsemide. He is now on Uptravi + riociguat.  Most recent echo in 7/23 showed normal RV size/systolic function and PA systolic pressure estimate was normal.   - He will stay off ambrisentan (? Prior trigger for volume overload).  - As above, he did not tolerate tadalafil.  - He is at 2000 mcg bid Uptravi.   - Continue riociguat at goal dose 2.5 mg tid.  4. Atrial fibrillation: Paroxysmal, noted on ILR. Maintaining SR today. - Continue apixaban 2.5 mg bid (dosed for age, renal dysfunction).  5. OSA: Cannot tolerate CPAP.  6. CVA: In 3/21, may have been atrial fibrillation-related.  On apixaban now.  7. Carotid stenosis: Carotid duplex 04/23: 40-59% right ICA stenosis, no significant stenosis on the left - Continue statin. 8. Interstitial lung disease:  Has  seen pulmonary, ILD seems mild and stable.    Wrote out order for home health agency to give PM dose of Torsemide no later than 3 pm. Hopefully this will help with nocturia.  Follow-up: 3 weeks with APP to assess volume, keep f/u with Dr. Aundra Dubin as scheduled in March in case needed  [facility], [facility] PA-C 02/22/2022

## 2022-02-22 NOTE — Patient Instructions (Addendum)
Thank you for coming in today  Labs were done today, if any labs are abnormal the clinic will call you No news is good news  EKG was done today  HOME HEALTH please give afternoon dose of Torsemide before 3pm.  Your physician recommends that you schedule a follow-up appointment in:  3-4 weeks In clinic     Do the following things EVERYDAY: Weigh yourself in the morning before breakfast. Write it down and keep it in a log. Take your medicines as prescribed Eat low salt foods--Limit salt (sodium) to 2000 mg per day.  Stay as active as you can everyday Limit all fluids for the day to less than 2 liters  At the [facility], you and your health needs are our priority. As part of our continuing mission to provide you with exceptional heart care, we have created designated Provider Care Teams. These Care Teams include your primary Cardiologist (physician) and Advanced Practice Providers (APPs- Physician Assistants and Nurse Practitioners) who all work together to provide you with the care you need, when you need it.   You may see any of the following providers on your designated Care Team at your next follow up: Dr Glori Bickers Dr Loralie Champagne Dr. Roxana Hires, NP Lyda Jester, [place] [facility] [place], [place] Forestine Na, NP Audry Riles, PharmD   Please be sure to bring in all your medications bottles to every appointment.   If you have any questions or concerns before your next appointment please send [place] a message through [place] or call our office at [phone].    TO LEAVE A MESSAGE FOR THE NURSE SELECT OPTION 2, PLEASE LEAVE A MESSAGE INCLUDING: YOUR NAME DATE OF BIRTH CALL BACK NUMBER REASON FOR CALL**this is important as we prioritize the call backs  YOU WILL RECEIVE A CALL BACK THE SAME DAY AS LONG AS YOU CALL BEFORE 4:00 PM

## 2022-02-24 ENCOUNTER — Telehealth (HOSPITAL_COMMUNITY): Payer: Self-pay | Admitting: Pharmacy Technician

## 2022-02-24 NOTE — Telephone Encounter (Signed)
Advanced Heart Failure Patient Advocate Encounter  The patient was approved for a [place] that will help cover the cost of Adempas, Uptravi. Total amount awarded, $10,000. Eligibility, 01/25/22 - 01/25/23.  ID [id number]  BIN [id number]  PCN [id number]  Group [id number]

## 2022-02-25 ENCOUNTER — Other Ambulatory Visit (HOSPITAL_COMMUNITY): Payer: Self-pay

## 2022-02-26 ENCOUNTER — Ambulatory Visit (HOSPITAL_COMMUNITY)
Admission: RE | Admit: 2022-02-26 | Discharge: 2022-02-26 | Disposition: A | Discharge status: Home / Self Care | Payer: Medicare HMO | Source: Ambulatory Visit | Attending: Cardiology | Admitting: Cardiology

## 2022-02-26 DIAGNOSIS — I5032 Chronic diastolic (congestive) heart failure: Secondary | ICD-10-CM | POA: Diagnosis present

## 2022-02-26 LAB — BASIC METABOLIC PANEL
Anion gap: 13 (ref 5–15)
BUN: 94 mg/dL — ABNORMAL HIGH (ref 8–23)
CO2: 32 mmol/L (ref 22–32)
Calcium: 7.1 mg/dL — ABNORMAL LOW (ref 8.9–10.3)
Chloride: 91 mmol/L — ABNORMAL LOW (ref 98–111)
Creatinine, Ser: 1.78 mg/dL — ABNORMAL HIGH (ref 0.61–1.24)
GFR, Estimated: 36 mL/min — ABNORMAL LOW (ref >60)
Glucose, Bld: 114 mg/dL — ABNORMAL HIGH (ref 70–99)
Potassium: 2.8 mmol/L — ABNORMAL LOW (ref 3.5–5.1)
Sodium: 136 mmol/L (ref 135–145)

## 2022-03-01 ENCOUNTER — Telehealth (HOSPITAL_COMMUNITY): Payer: Self-pay | Admitting: *Deleted

## 2022-03-01 DIAGNOSIS — E876 Hypokalemia: Secondary | ICD-10-CM

## 2022-03-01 DIAGNOSIS — I5032 Chronic diastolic (congestive) heart failure: Secondary | ICD-10-CM

## 2022-03-01 MED ORDER — POTASSIUM CHLORIDE 20 MEQ PO PACK: PACK | ORAL | Status: DC

## 2022-03-01 NOTE — Telephone Encounter (Signed)
[place], RN 03/01/2022  5:35 PM EST Back to Top    Spoke w/pt's wife, she is aware, agreeable, and verbalized understanding, repeat lab sch Fri 2/2   Larey Dresser, MD 02/26/2022  8:48 PM EST     And repeat BMET on Thursday or Friday.   Larey Dresser, MD 02/26/2022  8:47 PM EST     K is low at 2.8.  Increase KCl to 80 qam/40 qpm (it looks like he currently takes 80 qam).

## 2022-03-04 ENCOUNTER — Other Ambulatory Visit (HOSPITAL_COMMUNITY): Payer: Self-pay | Admitting: *Deleted

## 2022-03-04 ENCOUNTER — Other Ambulatory Visit (HOSPITAL_COMMUNITY): Payer: Self-pay | Admitting: Cardiology

## 2022-03-04 MED ORDER — POTASSIUM CHLORIDE CRYS ER 20 MEQ PO TBCR: EXTENDED_RELEASE_TABLET | ORAL | 6 refills | Status: DC

## 2022-03-05 ENCOUNTER — Ambulatory Visit (HOSPITAL_COMMUNITY)
Admission: RE | Admit: 2022-03-05 | Discharge: 2022-03-05 | Disposition: A | Discharge status: Home / Self Care | Payer: Medicare HMO | Source: Ambulatory Visit | Attending: Internal Medicine | Admitting: Internal Medicine

## 2022-03-05 DIAGNOSIS — I5032 Chronic diastolic (congestive) heart failure: Secondary | ICD-10-CM | POA: Diagnosis present

## 2022-03-05 DIAGNOSIS — E876 Hypokalemia: Secondary | ICD-10-CM

## 2022-03-05 LAB — BASIC METABOLIC PANEL
Anion gap: 15 (ref 5–15)
BUN: 75 mg/dL — ABNORMAL HIGH (ref 8–23)
CO2: 26 mmol/L (ref 22–32)
Calcium: 7.8 mg/dL — ABNORMAL LOW (ref 8.9–10.3)
Chloride: 95 mmol/L — ABNORMAL LOW (ref 98–111)
Creatinine, Ser: 1.61 mg/dL — ABNORMAL HIGH (ref 0.61–1.24)
GFR, Estimated: 41 mL/min — ABNORMAL LOW (ref >60)
Glucose, Bld: 109 mg/dL — ABNORMAL HIGH (ref 70–99)
Potassium: 4 mmol/L (ref 3.5–5.1)
Sodium: 136 mmol/L (ref 135–145)

## 2022-03-17 ENCOUNTER — Encounter (HOSPITAL_COMMUNITY): Payer: Medicare HMO

## 2022-03-17 LAB — CUP PACEART REMOTE DEVICE CHECK
Date Time Interrogation Session: 20240214230940
Implantable Pulse Generator Implant Date: 20210325

## 2022-03-18 ENCOUNTER — Other Ambulatory Visit: Payer: Self-pay | Admitting: Adult Health

## 2022-03-22 ENCOUNTER — Ambulatory Visit: Payer: Medicare HMO

## 2022-03-22 DIAGNOSIS — I639 Cerebral infarction, unspecified: Secondary | ICD-10-CM

## 2022-03-23 ENCOUNTER — Encounter (HOSPITAL_COMMUNITY): Payer: Medicare HMO

## 2022-03-29 NOTE — Progress Notes (Signed)
Carelink Summary Report / Loop Recorder 

## 2022-04-05 ENCOUNTER — Ambulatory Visit (HOSPITAL_COMMUNITY)
Admission: RE | Admit: 2022-04-05 | Discharge: 2022-04-05 | Disposition: A | Discharge status: Home / Self Care | Payer: Medicare HMO | Source: Ambulatory Visit | Attending: Cardiology | Admitting: Cardiology

## 2022-04-05 ENCOUNTER — Encounter (HOSPITAL_COMMUNITY): Payer: Self-pay | Admitting: Cardiology

## 2022-04-05 VITALS — BP 118/62 | HR 90 | Wt 184.0 lb

## 2022-04-05 DIAGNOSIS — I6521 Occlusion and stenosis of right carotid artery: Secondary | ICD-10-CM | POA: Insufficient documentation

## 2022-04-05 DIAGNOSIS — J849 Interstitial pulmonary disease, unspecified: Secondary | ICD-10-CM | POA: Insufficient documentation

## 2022-04-05 DIAGNOSIS — G4733 Obstructive sleep apnea (adult) (pediatric): Secondary | ICD-10-CM | POA: Diagnosis not present

## 2022-04-05 DIAGNOSIS — I5032 Chronic diastolic (congestive) heart failure: Secondary | ICD-10-CM | POA: Diagnosis not present

## 2022-04-05 DIAGNOSIS — Z9981 Dependence on supplemental oxygen: Secondary | ICD-10-CM | POA: Diagnosis not present

## 2022-04-05 DIAGNOSIS — Z8249 Family history of ischemic heart disease and other diseases of the circulatory system: Secondary | ICD-10-CM | POA: Insufficient documentation

## 2022-04-05 DIAGNOSIS — Z87891 Personal history of nicotine dependence: Secondary | ICD-10-CM | POA: Insufficient documentation

## 2022-04-05 DIAGNOSIS — Z8673 Personal history of transient ischemic attack (TIA), and cerebral infarction without residual deficits: Secondary | ICD-10-CM | POA: Diagnosis not present

## 2022-04-05 DIAGNOSIS — I272 Pulmonary hypertension, unspecified: Secondary | ICD-10-CM

## 2022-04-05 DIAGNOSIS — I480 Paroxysmal atrial fibrillation: Secondary | ICD-10-CM | POA: Diagnosis not present

## 2022-04-05 DIAGNOSIS — N1832 Chronic kidney disease, stage 3b: Secondary | ICD-10-CM | POA: Insufficient documentation

## 2022-04-05 DIAGNOSIS — I130 Hypertensive heart and chronic kidney disease with heart failure and stage 1 through stage 4 chronic kidney disease, or unspecified chronic kidney disease: Secondary | ICD-10-CM | POA: Diagnosis not present

## 2022-04-05 DIAGNOSIS — Z79899 Other long term (current) drug therapy: Secondary | ICD-10-CM | POA: Insufficient documentation

## 2022-04-05 DIAGNOSIS — I2721 Secondary pulmonary arterial hypertension: Secondary | ICD-10-CM | POA: Insufficient documentation

## 2022-04-05 DIAGNOSIS — Z7901 Long term (current) use of anticoagulants: Secondary | ICD-10-CM | POA: Insufficient documentation

## 2022-04-05 LAB — BASIC METABOLIC PANEL
Anion gap: 10 (ref 5–15)
BUN: 73 mg/dL — ABNORMAL HIGH (ref 8–23)
CO2: 27 mmol/L (ref 22–32)
Calcium: 7.3 mg/dL — ABNORMAL LOW (ref 8.9–10.3)
Chloride: 98 mmol/L (ref 98–111)
Creatinine, Ser: 1.84 mg/dL — ABNORMAL HIGH (ref 0.61–1.24)
GFR, Estimated: 35 mL/min — ABNORMAL LOW (ref >60)
Glucose, Bld: 99 mg/dL (ref 70–99)
Potassium: 4.2 mmol/L (ref 3.5–5.1)
Sodium: 135 mmol/L (ref 135–145)

## 2022-04-05 LAB — BRAIN NATRIURETIC PEPTIDE: B Natriuretic Peptide: 136.7 pg/mL — ABNORMAL HIGH (ref 0.0–100.0)

## 2022-04-05 MED ORDER — METOLAZONE 2.5 MG PO TABS: 2.5000 mg | ORAL_TABLET | ORAL | 3 refills | Status: DC

## 2022-04-05 NOTE — Progress Notes (Signed)
PCP: Roetta Sessions, NP Cardiology: Dr. Aundra Dubin  89 y.o. with history of interstitial lung disease (?NSIP vs UIP) on home oxygen, pulmonary hypertension, diastolic CHF, and prior non-Hodgkins lymphoma (pelvic mass, s/p XRT) presents for followup of CHF and pulmonary hypertension.  Patient has history of reportedly mild ILD (NSIP vs UIP) followed by a pulmonologist at [facility] and pulmonary hypertension thought to be out of proportion lung disease followed by a physician at [facility].  Last CT chest was in 11/19, showing stable ILD (?UIP vs NSIP).  He had RHC in 2/20 with moderate PAH, PVR 5.1 WU.  He was tried on Adcirca but developed cognitive problems and worsening of peripheral edema so this was stopped.     Echo 06/20 showed normal LV size and systolic function, RV reportedly normal.  6/20 RHC showed normal filling pressures with moderate pulmonary hypertension, PVR 5.35 WU. After discharge from hospital, patient started on ambrisentan.  His weight steadily began to increase and he developed worsening lower extremity edema, we stopped ambrisentan. He has been able to tolerate Uptravi.  He was admitted in 3/21 with suspected CVA, MRI head showed punctate occipital lobe infarct, thought to be due to small vessel disease. ILR was implanted.  Echo in 3/21 showed EF 60-65%, severe LVH, normal RV.  PYP scan in 3/21 was not suggestive of TTR amyloidosis.  Subsequently, ILR has shown paroxysmal atrial fibrillation.   Echo in 6/22 showed EF 65-70% with mild LVH, normal RV, IVC normal, PASP estimated 29 mmHg.   Patient was admitted in 9/22 with upper GI bleed from bleeding gastric polyps.  He had polypectomy and has been restarted on Eliquis.   He was seen in the ER with atrial fibrillation in 12/22.    Echo in 7/23 showed EF 60-65%, RV normal, PASP estimation was not elevated, IVC normal.   Seen 10/23 for an acute visit when BP 70's and HR 44 at Neurology visit. Kidney function elevated and  metolazone decreased back to once a week. Midodrine added.  Patient admitted in 1/24 with RV failure and UTI. Had not been taking his diuretics as prescribed (especially PM doses). He was diuresed with IV lasix and restarted on home torsemide and weekly metolazone. Echo during admit with EF 65-70%, D shaped septum, mildly dilated RV with mild RV dysfunction. RHC showed normal filling pressures and mild pulmonary venous hypertension after diuresis.  He returns for followup of CHF. He is accompanied by his wife. Wearing home oxygen.  He is feeling better in terms of energy but weight is slowly rising.  Up 7 lbs since last appointment. He walks with walker, can walk 5 minutes then gets short of breath and has to stop.  No lightheadedness.  No chest pain.  He chronically sleeps in a recliner. Doing physical therapy.   BP soft today. Denies dizziness.  6 minute walk (8/21): 122 m 6 minute walk (11/21): 244 m 6 minute walk (3/22): 122 m  Labs (2/24): K 4, creatinine 1.61  PMH: 1. Non-Hodgkin's lymphoma: Pelvic involvement.  S/p radiation.  Thought to be in remission (Dr. Benay Spice).  2. HTN 3. PACs/PVCs 4. GERD: Barrett's esophagus.  5. 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  - High resolution CT chest 11/19: ILD, ?UIP pattern.  6. Pulmonary hypertension: Thought to be out of proportion to underlying lung disease (ILD, ?NSIP vs UIP).   - RHC (2/20): mean RA 10, PASP 60 with mean 42, mean PCWP 11, CI 3, PVR 5.1 WU.  - Trial  of Adcirca => failed due to ?memory worsening/cognitive affects and peripheral edema.  - Echo (6/20): EF 60-65%, mild LVH, RV reportedly normal, PASP 59 mmHg.  - RHC (6/20): mean RA 7, PA 50/24 mean 34, mean PCWP 12, CI 2.01, PAPI 3.7, PVR 5.35 WU - RF negative, ANA negative, anti-SCL70 negative, anti-centromere negative.  - V/Q scan (8/20): No evidence for chronic PE.  - Echo (3/21): EF 60-65%, severe LVH, normal RV.  - Echo (6/22): EF 65-70% with mild LVH, normal RV, IVC normal,  PASP estimated 29 mmHg. - Echo (7/23): EF 60-65%, RV normal, PASP estimation was not elevated, IVC normal.  - Echo (1/24): EF 65-70% with D-shaped septum, mild RV dilation/dysfunction, dilated IVC - RHC (1/24): mean RA 6, PA 45/22 mean 32, mean PCWP 14, CI 3.99, PVR 2.4 WU.  7. CKD: Stage 3.   8. Wandering atrial pacemaker 9. OSA 10. PYP scan (3/21): H/CL ratio 1.0, grade 0.  11. CVA: MRI 3/21 with punctate occipital lobe infarct thought to be due to small vessel disease. - Carotid dopplers (3/21): 40-59% RICA stenosis.  - Carotid dopplers (4/22): 40-59% RICA stenosis.  - Carotid dopplers (4/23): 40-59% RICA stenosis.  12. Atrial fibrillation: Paroxysmal, noted on ILR.  13. GI bleeding 9/22 from gastric polyps.  14. 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  Tobacco Use   Smoking status: Former    Packs/day: 1.00    Years: 35.00    Total pack years: 35.00    Types: Pipe, Cigarettes    Quit date: 02/23/1992    Years since quitting: 30.1   Smokeless tobacco: Never   Tobacco comments:    quit 20 years ago  Vaping Use   Vaping Use: Never used  Substance and Sexual Activity   Alcohol use: Not Currently    Comment: 2 drinks daily scotch  AND WINE WITH SUPPER   Drug use: No   Sexual activity: Not on file  Other Topics Concern   Not on file  Social History Narrative   Married - second marriage   He has two children   Retired Horticulturist, commercial Professor   Currently teaches pottery making, 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[facility]   Former Smoker quit 12 years ago- smoked for 35 years   Alcohol use- not currently   Right handed   Social Determinants of Health   Financial Resource Strain: Low Risk  (02/08/2022)   Overall Financial  Resource Strain (CARDIA)    Difficulty of Paying Living Expenses: Not hard at all  Food Insecurity: No Food Insecurity (02/08/2022)   Hunger Vital Sign    Worried About Running Out of Food in the Last Year: Never true    [place] in the Last Year: Never true  Transportation Needs: No Transportation Needs (02/08/2022)   PRAPARE - Hydrologist (Medical): No    Lack of Transportation (Non-Medical): No  Physical Activity: Not on file  Stress: Not on file  Social Connections: Not on file  Intimate Partner Violence: Not At Risk (02/08/2022)   Humiliation, Afraid, Rape, and Kick questionnaire    Fear of Current or Ex-Partner: No    Emotionally  Abused: No    Physically Abused: No    Sexually Abused: No   Family History  Problem Relation Age of Onset   Dementia Mother    Heart disease Father        MI 42   Urticaria Father    Restless legs syndrome Father    Dementia Sister    Parkinson's disease Sister    ALS Sister    Prostate cancer Brother    Prostate cancer Paternal Uncle    Prostate cancer Paternal Uncle    Prostate cancer Paternal Uncle    Hyperlipidemia Other    Stroke Other    Hypertension Other    ADD / ADHD Other    Colon cancer Neg Hx    Allergic rhinitis Neg Hx    Asthma Neg Hx    Eczema Neg Hx    ROS: All systems reviewed and negative except as per HPI.   Current Outpatient Medications  Medication Sig Dispense Refill   acetaminophen (TYLENOL) 500 MG tablet Take 500 mg by mouth every 6 (six) hours as needed for mild pain or moderate pain.     atorvastatin (LIPITOR) 40 MG tablet Take 1 tablet (40 mg total) by mouth daily at 6 PM. 30 tablet 1   cyanocobalamin (,VITAMIN B-12,) 1000 MCG/ML injection Inject 1,000 mcg into the muscle every 30 (thirty) days.     ELIQUIS 2.5 MG TABS tablet TAKE ONE TABLET TWICE DAILY 60 tablet 11   escitalopram (LEXAPRO) 10 MG tablet Take 10 mg by mouth in the morning.     finasteride (PROSCAR) 5 MG tablet  Take 5 mg by mouth every evening.     FLONASE SENSIMIST 27.5 MCG/SPRAY nasal spray Place 1 spray into the nose daily as needed for rhinitis or allergies.      gabapentin (NEURONTIN) 100 MG capsule TAKE ONE CAPSULE AT BEDTIME 30 capsule 1   guaiFENesin (MUCINEX) 600 MG 12 hr tablet Take 600 mg by mouth in the morning.     Iron, Ferrous Sulfate, 325 (65 Fe) MG TABS Take 325 mg by mouth daily. 30 tablet 4   metoprolol succinate (TOPROL-XL) 25 MG 24 hr tablet TAKE ONE-HALF TABLET AT BEDTIME 15 tablet 3   midodrine (PROAMATINE) 5 MG tablet Take 2 tablets (10 mg total) by mouth 3 (three) times daily with meals. 90 tablet 11   Multiple Vitamins-Minerals (PRESERVISION AREDS 2) CAPS Take 1 capsule by mouth 2 (two) times daily.     omeprazole (PRILOSEC) 20 MG capsule Take 1 capsule (20 mg total) by mouth 2 (two) times daily before a meal. 60 capsule 2   ondansetron (ZOFRAN-ODT) 4 MG disintegrating tablet Take 1 tablet (4 mg total) by mouth every 8 (eight) hours as needed for nausea or vomiting. 20 tablet 0   OXYGEN Inhale 2 L/min into the lungs. As needed and at night     potassium chloride SA (KLOR-CON M) 20 MEQ tablet Take 4 tablets (80 mEq total) by mouth in the morning AND 2 tablets (40 mEq total) every evening. 180 tablet 6   Riociguat (ADEMPAS) 2.5 MG TABS Take 2.5 mg by mouth 3 (three) times daily. 90 tablet 11   rOPINIRole (REQUIP) 2 MG tablet TAKE ONE TABLET THREE TIMES DAILY 90 tablet 4   Selexipag (UPTRAVI) 1600 MCG TABS Take 1,600 mcg by mouth 2 (two) times daily. 0800 and 1900     Selexipag 400 MCG TABS Take 400 mcg by mouth 2 (two) times daily. 0800 and 1900  torsemide (DEMADEX) 100 MG tablet TAKE ONE TABLET TWICE DAILY 180 tablet 3   traMADol (ULTRAM) 50 MG tablet Take 50 mg by mouth every 6 (six) hours as needed for moderate pain or severe pain.     metolazone (ZAROXOLYN) 2.5 MG tablet Take 1 tablet (2.5 mg total) by mouth 2 (two) times a week. Every Tuesday and Friday 12 tablet 3   No  current facility-administered medications for this encounter.   BP 118/62   Pulse 90   Wt 83.5 kg (184 lb)   SpO2 91%   BMI 30.62 kg/m   Wt Readings from Last 3 Encounters:  04/05/22 83.5 kg (184 lb)  02/22/22 80.6 kg (177 lb 12.8 oz)  02/08/22 78.1 kg (172 lb 2.9 oz)   General: NAD Neck: JVP 8-9 cm, no thyromegaly or thyroid nodule.  Lungs: Dry crackles at bases.  CV: Nondisplaced PMI.  Heart regular S1/S2, no S3/S4, no murmur.  2+ edema to knees.  No carotid bruit.  Normal pedal pulses.  Abdomen: Soft, nontender, no hepatosplenomegaly, mild distention.  Skin: Intact without lesions or rashes.  Neurologic: Alert and oriented x 3.  Psych: Normal affect. Extremities: No clubbing or cyanosis.  HEENT: Normal.   Assessment/Plan: 1.Chronic diastolic CHF: Suspect with significant component of RV failure from pulmonary hypertension.  Echo in 6/20 with EF 60-65%, RV not well-visualized but PASP 59 mmHg.  Echo (3/21) with EF 60-65%, severe LVH, normal RV, PASP 28 mmHg.  PYP scan was not suggestive of TTR cardiac amyloidosis. Echo in 6/22 showed EF 65-70% with mild LVH, normal RV, IVC normal, PASP estimated 29 mmHg.  Admit 01/24 with volume overload in setting of noncompliance with diuretics. Echo 01/24 with EF 65-70% with D-shaped septum, mild RV dilation/dysfunction, dilated IVC. [place] 1/24 showed normal filling pressures with mild pulmonary venous hypertension after diuresis. NYHA class III symptoms, weight rising and volume overloaded on exam.  - Continue torsemide 100 mg bid.   - He is not sure he is taking metolazone.  I will have him start metolazone 2.5 mg twice weekly on Tuesdays and Fridays.  BMET/BNP today and BMET in 10 days.  - Continue KCl 80 qam/40 qpm with extra 40 KCl on metolazone days.  - On midodrine 10 TID 2. CKD Stage 3b: BMET today. 3. Pulmonary hypertension: Patient thought to have [place] out of proportion to his lung disease (ILD, NSIP versus UIP). RHC in 2/20 showed mean PA  pressure 41, PVR 5.1 WU, CI 3. Echo in 6/20 with RV reportedly ok => I reviewed and do not think that RV was well-visualized.  Patient did not tolerate Adcirca due to edema/?cognitive effects.  RHC was done again in 6/20, showed moderate pulmonary arterial hypertension with PVR 5.35 (comparable to prior).  Serological workup for PH was negative. Patient started ambrisentan 5 mg and developed significant edema/volume overload despite a high dose of torsemide. He is now on Uptravi + riociguat.  RHC in 1/24 after diuresis showed mild pulmonary venous hypertension with PVR 2.4 WU.  - He will stay off ambrisentan (? Prior trigger for volume overload).  - As above, he did not tolerate tadalafil.  - He is at 2000 mcg bid Uptravi.   - Continue riociguat at goal dose 2.5 mg tid.  4. Atrial fibrillation: Paroxysmal, noted on ILR. Remains in NSR.  - Continue apixaban 2.5 mg bid (dosed for age, renal dysfunction).  5. OSA: Cannot tolerate CPAP.  6. CVA: In 3/21, may have been atrial fibrillation-related.  On apixaban now.  7. Carotid stenosis: Carotid duplex 04/23: 40-59% right ICA stenosis, no significant stenosis on the left - Continue statin. - Repeat carotid dopplers in 4/24.  8. Interstitial lung disease:  Has seen pulmonary, ILD seems mild and stable.   Follow-up: 3 weeks with APP to assess volume  Loralie Champagne  04/05/2022

## 2022-04-05 NOTE — Patient Instructions (Signed)
CHANGE Metolazone to 2.'5mg'$  every Tuesday and Friday. Take extra 38mq  ( 2 Tabs) of Potassium on these days.  Labs done today, your results will be available in MyChart, we will contact you for abnormal readings.  Repeat blood work in 10 days.  Your provider has ordered Carotid Dopplers. You will be called to have this test arranged.  Your physician recommends that you schedule a follow-up appointment in: 3 weeks  If you have any questions or concerns before your next appointment please send u[place] message through mHooverson Heightsor call our office at [phone]    TO LEAVE A MESSAGE FOR THE NURSE SELECT OPTION 2, PLEASE LEAVE A MESSAGE INCLUDING: YOUR NAME DATE OF BIRTH CALL BACK NUMBER REASON FOR CALL**this is important as we prioritize the call backs  YOU WILL RECEIVE A CALL BACK THE SAME DAY AS LONG AS YOU CALL BEFORE 4:00 PM  [facility] the [facility] you and your health needs are our priority. As part of our continuing mission to provide you with exceptional heart care, we have created designated Provider Care Teams. These Care Teams include your primary Cardiologist (physician) and Advanced Practice Providers (APPs- Physician Assistants and Nurse Practitioners) who all work together to provide you with the care you need, when you need it.   You may see any of the following providers on your designated Care Team at your next follow up: Dr DGlori BickersDr DLoralie ChampagneDr. ARoxana Hires NP BLyda Jester PUtahJOpelousas General Health System South CampusLNewton Falls PUtahAForestine Na NP LAudry Riles PharmD   Please be sure to bring in all your medications bottles to every appointment.    [facility] you for choosing [facility]

## 2022-04-09 ENCOUNTER — Telehealth (HOSPITAL_COMMUNITY): Payer: Self-pay | Admitting: Cardiology

## 2022-04-09 NOTE — Telephone Encounter (Addendum)
[facility] called to request mediction change update SNF RN was told meds were changed and no orders received  Updated order faxed to [facility] with recent [place]

## 2022-04-15 ENCOUNTER — Ambulatory Visit (HOSPITAL_COMMUNITY)
Admission: RE | Admit: 2022-04-15 | Discharge: 2022-04-15 | Disposition: A | Discharge status: Home / Self Care | Payer: Medicare HMO | Source: Ambulatory Visit | Attending: Cardiology | Admitting: Cardiology

## 2022-04-15 ENCOUNTER — Telehealth (HOSPITAL_COMMUNITY): Payer: Self-pay

## 2022-04-15 DIAGNOSIS — I5032 Chronic diastolic (congestive) heart failure: Secondary | ICD-10-CM | POA: Diagnosis present

## 2022-04-15 LAB — BASIC METABOLIC PANEL
Anion gap: 15 (ref 5–15)
BUN: 97 mg/dL — ABNORMAL HIGH (ref 8–23)
CO2: 33 mmol/L — ABNORMAL HIGH (ref 22–32)
Calcium: 7.4 mg/dL — ABNORMAL LOW (ref 8.9–10.3)
Chloride: 87 mmol/L — ABNORMAL LOW (ref 98–111)
Creatinine, Ser: 1.8 mg/dL — ABNORMAL HIGH (ref 0.61–1.24)
GFR, Estimated: 36 mL/min — ABNORMAL LOW (ref >60)
Glucose, Bld: 132 mg/dL — ABNORMAL HIGH (ref 70–99)
Potassium: 3.6 mmol/L (ref 3.5–5.1)
Sodium: 135 mmol/L (ref 135–145)

## 2022-04-15 NOTE — Telephone Encounter (Signed)
Patient was here for labs today and his wife stopped to discuss an issue that came up with previous visit. She indicated when he was last here that there was a change in his metolazone - that he would be taking 2x weekly now. She stated nurse told her she would call facility and give them medication change information. Changes were not given to facility per wife. She had her [place] that helps him call back over here to have the information faxed so he could get his medication changed. The facility indicated they had not received information until the fax was sent.   She is upset because he missed a week of the medication that was changed, and she was concerned because of the breakdown in how it was handled that it could potentially have caused him problems. I indicated I would document information for her and reach out to parties involved and see how we can avoid a communication breakdown in the future.  Could you please call patient to discuss with her?

## 2022-04-21 ENCOUNTER — Other Ambulatory Visit (HOSPITAL_COMMUNITY): Payer: Self-pay

## 2022-04-26 ENCOUNTER — Telehealth: Payer: Self-pay | Admitting: Adult Health

## 2022-04-26 ENCOUNTER — Encounter (HOSPITAL_COMMUNITY): Payer: Medicare HMO

## 2022-04-26 ENCOUNTER — Ambulatory Visit (INDEPENDENT_AMBULATORY_CARE_PROVIDER_SITE_OTHER): Payer: Medicare HMO

## 2022-04-26 DIAGNOSIS — I5032 Chronic diastolic (congestive) heart failure: Secondary | ICD-10-CM | POA: Diagnosis not present

## 2022-04-26 LAB — CUP PACEART REMOTE DEVICE CHECK
Date Time Interrogation Session: 20240324231216
Implantable Pulse Generator Implant Date: 20210325

## 2022-04-26 NOTE — Telephone Encounter (Signed)
Pt called stating that he has not been able to sleep for two days. Says his RLS is uncontrollable and is needing to be advised on what he can do to control it.

## 2022-04-26 NOTE — Telephone Encounter (Signed)
I called and spoke to wife. He is not sleeping and having increase of RLS.  She said that she has been in touch with Clide Deutscher, NP and [employer] (Drs. Making hous calls) Delsa Grana.  Pt has been prescribed Lyrica 100mg  po tid, Ativan 1mg  prior to bedtime tonight and then again Tuesday qhs.  Will continue taking gabapentin 100mg  po qhs, and the ropinirole 2mg  po tid.  Pcp NP will see Thursday for appt.  Pt lives at [facility]. She understands very strong medications/ sedating. Appreciated call back.

## 2022-04-27 NOTE — Telephone Encounter (Signed)
Noted  

## 2022-05-03 NOTE — Progress Notes (Signed)
Carelink Summary Report / Loop Recorder 

## 2022-05-04 ENCOUNTER — Emergency Department (HOSPITAL_COMMUNITY)
Admission: EM | Admit: 2022-05-04 | Discharge: 2022-05-04 | Disposition: A | Discharge status: Skilled Nursing Facility | Payer: Medicare HMO | Attending: Emergency Medicine | Admitting: Emergency Medicine

## 2022-05-04 ENCOUNTER — Emergency Department (HOSPITAL_COMMUNITY): Payer: Medicare HMO

## 2022-05-04 ENCOUNTER — Encounter (HOSPITAL_COMMUNITY): Payer: Medicare HMO

## 2022-05-04 ENCOUNTER — Other Ambulatory Visit: Payer: Self-pay

## 2022-05-04 DIAGNOSIS — I110 Hypertensive heart disease with heart failure: Secondary | ICD-10-CM | POA: Insufficient documentation

## 2022-05-04 DIAGNOSIS — Z7901 Long term (current) use of anticoagulants: Secondary | ICD-10-CM | POA: Diagnosis not present

## 2022-05-04 DIAGNOSIS — I5033 Acute on chronic diastolic (congestive) heart failure: Secondary | ICD-10-CM | POA: Diagnosis not present

## 2022-05-04 DIAGNOSIS — R0602 Shortness of breath: Secondary | ICD-10-CM | POA: Diagnosis present

## 2022-05-04 DIAGNOSIS — Z79899 Other long term (current) drug therapy: Secondary | ICD-10-CM | POA: Diagnosis not present

## 2022-05-04 DIAGNOSIS — R600 Localized edema: Secondary | ICD-10-CM | POA: Insufficient documentation

## 2022-05-04 LAB — COMPREHENSIVE METABOLIC PANEL
ALT: 11 U/L (ref 0–44)
AST: 16 U/L (ref 15–41)
Albumin: 3.3 g/dL — ABNORMAL LOW (ref 3.5–5.0)
Alkaline Phosphatase: 103 U/L (ref 38–126)
Anion gap: 11 (ref 5–15)
BUN: 85 mg/dL — ABNORMAL HIGH (ref 8–23)
CO2: 28 mmol/L (ref 22–32)
Calcium: 7.6 mg/dL — ABNORMAL LOW (ref 8.9–10.3)
Chloride: 101 mmol/L (ref 98–111)
Creatinine, Ser: 1.57 mg/dL — ABNORMAL HIGH (ref 0.61–1.24)
GFR, Estimated: 42 mL/min — ABNORMAL LOW (ref >60)
Glucose, Bld: 104 mg/dL — ABNORMAL HIGH (ref 70–99)
Potassium: 4.2 mmol/L (ref 3.5–5.1)
Sodium: 140 mmol/L (ref 135–145)
Total Bilirubin: 0.4 mg/dL (ref 0.3–1.2)
Total Protein: 6.6 g/dL (ref 6.5–8.1)

## 2022-05-04 LAB — CBC WITH DIFFERENTIAL/PLATELET
Abs Immature Granulocytes: 0.02 10*3/uL (ref 0.00–0.07)
Basophils Absolute: 0 10*3/uL (ref 0.0–0.1)
Basophils Relative: 0 %
Eosinophils Absolute: 0.3 10*3/uL (ref 0.0–0.5)
Eosinophils Relative: 4 %
HCT: 30.9 % — ABNORMAL LOW (ref 39.0–52.0)
Hemoglobin: 9.2 g/dL — ABNORMAL LOW (ref 13.0–17.0)
Immature Granulocytes: 0 %
Lymphocytes Relative: 23 %
Lymphs Abs: 1.8 10*3/uL (ref 0.7–4.0)
MCH: 25.6 pg — ABNORMAL LOW (ref 26.0–34.0)
MCHC: 29.8 g/dL — ABNORMAL LOW (ref 30.0–36.0)
MCV: 85.8 fL (ref 80.0–100.0)
Monocytes Absolute: 0.6 10*3/uL (ref 0.1–1.0)
Monocytes Relative: 8 %
Neutro Abs: 4.9 10*3/uL (ref 1.7–7.7)
Neutrophils Relative %: 65 %
Platelets: 336 10*3/uL (ref 150–400)
RBC: 3.6 MIL/uL — ABNORMAL LOW (ref 4.22–5.81)
RDW: 16.1 % — ABNORMAL HIGH (ref 11.5–15.5)
WBC: 7.6 10*3/uL (ref 4.0–10.5)
nRBC: 0 % (ref 0.0–0.2)

## 2022-05-04 LAB — TROPONIN I (HIGH SENSITIVITY)
Troponin I (High Sensitivity): 14 ng/L (ref <18)
Troponin I (High Sensitivity): 14 ng/L (ref <18)

## 2022-05-04 LAB — BRAIN NATRIURETIC PEPTIDE: B Natriuretic Peptide: 173.1 pg/mL — ABNORMAL HIGH (ref 0.0–100.0)

## 2022-05-04 MED ORDER — METOLAZONE 5 MG PO TABS
5.0000 mg | ORAL_TABLET | Freq: Once | ORAL | Status: DC
  Filled 2022-05-04: qty 1

## 2022-05-04 MED ORDER — ROPINIROLE HCL 1 MG PO TABS
2.0000 mg | ORAL_TABLET | Freq: Once | ORAL | Status: AC
  Administered 2022-05-04: 2 mg via ORAL
  Filled 2022-05-04: qty 2

## 2022-05-04 MED ORDER — METOLAZONE 2.5 MG PO TABS: ORAL_TABLET | ORAL | 0 refills | Status: DC

## 2022-05-04 MED ORDER — FUROSEMIDE 10 MG/ML IJ SOLN
40.0000 mg | Freq: Once | INTRAMUSCULAR | Status: AC
  Administered 2022-05-04: 40 mg via INTRAVENOUS
  Filled 2022-05-04: qty 4

## 2022-05-04 NOTE — ED Notes (Signed)
Condom cath placed without incident.

## 2022-05-04 NOTE — Discharge Instructions (Addendum)
Today patient and that you had some fluid overload, I will need you just hold your metolazone as prescribed as usual this week, and start my dosage.  Please start 5 mg tomorrow, and then do 2.5 mg for 3 days in a row.  Do not take your normal prescribed dose of metolazone until next Tuesday, take my medication instead.  Return to the ER if you have any shortness of breath, chest pain, worsening swelling overlays, confusion.

## 2022-05-04 NOTE — ED Notes (Signed)
Attempted to call report to [place] at [facility]

## 2022-05-04 NOTE — ED Notes (Signed)
Patient transported to X-ray 

## 2022-05-04 NOTE — ED Triage Notes (Signed)
Pt is a&ox4, pwd. Complains of increased wob/shob with decreased O2 sats even on chronic O2. Pt does also have edema to abdomen and bilateral lower legs. Pt denies cp. Pt has 100% O2 sats on 4 lpm.

## 2022-05-04 NOTE — ED Provider Notes (Signed)
[facility] Provider Note   CSN: [id number] Arrival date & time: 05/04/22  1308     History  Chief Complaint  Patient presents with   Shortness of Breath    Increase shob, especially with exertion. Decreased O2 sats even with chronic 2 lpm. Pt denies cp    Ryan [name] is a 89 y.o. male, of diastolic heart failure, who presents to the ED secondary to increased shortness of breath, bilateral lower leg swelling, this been occurring over the last couple weeks.  He states he is also been getting more weight.  Has been taking his torsemide as prescribed, and has been compliant with his Eliquis.  Has also been taking his metolazone twice a week.  He states that even though he has been taking these medications he is still gaining weight.  Was supposed to see his heart doctor today, but the home nurse took his oxygen today and it was low, was 87%, was placed on his 4 L O2 at home, and it bumped up to 91%.  He states he wears his 4 L O2 at home as needed, mostly at night, because when he is laying down it is more difficult to breathe, however today he noticed that he needed a little bit more frequently.  He denies any chest pain, abdominal pain, urinary symptoms.  Torsemide 100mg  BID, metolazone 2.5mg  2x/week, Kcl 173mEq daily   Home Medications Prior to Admission medications   Medication Sig Start Date End Date Taking? Authorizing Provider  metolazone (ZAROXOLYN) 2.5 MG tablet Take 2 tablets (5 mg total) by mouth daily for 1 day, THEN 1 tablet (2.5 mg total) daily for 3 days. Please take the metolazone as prescribed, hold your normal metolazone this week and restart your normal dosage next week.  Please follow-up with your cardiologist.. 05/04/22 05/08/22 Yes Yazmyne Sara L, PA  acetaminophen (TYLENOL) 500 MG tablet Take 500 mg by mouth every 6 (six) hours as needed for mild pain or moderate pain.    [provider]  atorvastatin (LIPITOR) 40 MG  tablet Take 1 tablet (40 mg total) by mouth daily at 6 PM. 04/27/19   Hosie Poisson, MD  cyanocobalamin (,VITAMIN B-12,) 1000 MCG/ML injection Inject 1,000 mcg into the muscle every 30 (thirty) days.    [provider]  ELIQUIS 2.5 MG TABS tablet TAKE ONE TABLET TWICE DAILY 07/08/21   Larey Dresser, MD  escitalopram (LEXAPRO) 10 MG tablet Take 10 mg by mouth in the morning.    [provider]  finasteride (PROSCAR) 5 MG tablet Take 5 mg by mouth every evening.    [provider]  FLONASE SENSIMIST 27.5 MCG/SPRAY nasal spray Place 1 spray into the nose daily as needed for rhinitis or allergies.  12/11/18   [provider]  gabapentin (NEURONTIN) 100 MG capsule TAKE ONE CAPSULE AT BEDTIME 03/18/22   Ward Givens, NP  guaiFENesin (MUCINEX) 600 MG 12 hr tablet Take 600 mg by mouth in the morning.    [provider]  Iron, Ferrous Sulfate, 325 (65 Fe) MG TABS Take 325 mg by mouth daily. 12/31/20   Milford, Maricela Bo, FNP  LORazepam (ATIVAN) 1 MG tablet Take 1 mg by mouth at bedtime. Tonight and Tuesday 04-27-22    [provider]  metolazone (ZAROXOLYN) 2.5 MG tablet Take 1 tablet (2.5 mg total) by mouth 2 (two) times a week. Every Tuesday and Friday 04/05/22   Larey Dresser, MD  metoprolol succinate (TOPROL-XL) 25 MG 24 hr tablet TAKE ONE-HALF TABLET AT BEDTIME 11/18/21   Larey Dresser, MD  midodrine (PROAMATINE) 5 MG tablet Take 2 tablets (10 mg total) by mouth 3 (three) times daily with meals. 02/08/22   Ghimire, Henreitta Leber, MD  Multiple Vitamins-Minerals (PRESERVISION AREDS 2) CAPS Take 1 capsule by mouth 2 (two) times daily.    [provider]  omeprazole (PRILOSEC) 20 MG capsule Take 1 capsule (20 mg total) by mouth 2 (two) times daily before a meal. 09/16/21   Sharyn Creamer, MD  ondansetron (ZOFRAN-ODT) 4 MG disintegrating tablet Take 1 tablet (4 mg total) by mouth every 8 (eight) hours as needed for nausea or vomiting. 02/08/22    Ghimire, Henreitta Leber, MD  OXYGEN Inhale 2 L/min into the lungs. As needed and at night    [provider]  potassium chloride SA (KLOR-CON M) 20 MEQ tablet Take 4 tablets (80 mEq total) by mouth in the morning AND 2 tablets (40 mEq total) every evening. 03/04/22   Larey Dresser, MD  pregabalin (LYRICA) 100 MG capsule Take 100 mg by mouth 3 (three) times daily.    [provider]  Riociguat (ADEMPAS) 2.5 MG TABS Take 2.5 mg by mouth 3 (three) times daily. 08/20/21   Larey Dresser, MD  rOPINIRole (REQUIP) 2 MG tablet TAKE ONE TABLET THREE TIMES DAILY 12/16/21   Ward Givens, NP  Selexipag (UPTRAVI) 1600 MCG TABS Take 1,600 mcg by mouth 2 (two) times daily. 0800 and 1900    [provider]  Selexipag 400 MCG TABS Take 400 mcg by mouth 2 (two) times daily. 0800 and 1900    [provider]  torsemide (DEMADEX) 100 MG tablet TAKE ONE TABLET TWICE DAILY 09/24/21   Milford, Maricela Bo, FNP  traMADol (ULTRAM) 50 MG tablet Take 50 mg by mouth every 6 (six) hours as needed for moderate pain or severe pain.    [provider]      Allergies    Pollen extract-tree extract, Feraheme [ferumoxytol], and Molds & smuts    Review of Systems   Review of Systems  Respiratory:  Positive for shortness of breath.   Cardiovascular:  Positive for leg swelling. Negative for chest pain.    Physical Exam Updated Vital Signs BP (!) 106/56 (BP Location: Right Arm)   Pulse 81   Temp 98 F (36.7 C) (Oral)   Resp 20   Ht 5\' 6"  (1.676 m)   Wt 83.9 kg   SpO2 96%   BMI 29.86 kg/m  Physical Exam Vitals and nursing note reviewed.  Constitutional:      General: He is not in acute distress.    Appearance: He is well-developed.  HENT:     Head: Normocephalic and atraumatic.  Eyes:     Conjunctiva/sclera: Conjunctivae normal.  Cardiovascular:     Rate and Rhythm: Normal rate and regular rhythm.     Heart sounds: No murmur heard. Pulmonary:     Effort: Pulmonary  effort is normal. No respiratory distress.     Comments: Crackles bilateral lower lungs, 4 L O2 Abdominal:     Palpations: Abdomen is soft.     Tenderness: There is no abdominal tenderness.  Musculoskeletal:        General: No swelling.     Cervical back: Neck supple.     Right lower leg: Edema present.     Left lower leg: No edema.  Skin:    General:  Skin is warm and dry.     Capillary Refill: Capillary refill takes less than 2 seconds.  Neurological:     Mental Status: He is alert.  Psychiatric:        Mood and Affect: Mood normal.     ED Results / Procedures / Treatments   Labs (all labs ordered are listed, but only abnormal results are displayed) Labs Reviewed  CBC WITH DIFFERENTIAL/PLATELET - Abnormal; Notable for the following components:      Result Value   RBC 3.60 (*)    Hemoglobin 9.2 (*)    HCT 30.9 (*)    MCH 25.6 (*)    MCHC 29.8 (*)    RDW 16.1 (*)    All other components within normal limits  COMPREHENSIVE METABOLIC PANEL - Abnormal; Notable for the following components:   Glucose, Bld 104 (*)    BUN 85 (*)    Creatinine, Ser 1.57 (*)    Calcium 7.6 (*)    Albumin 3.3 (*)    GFR, Estimated 42 (*)    All other components within normal limits  BRAIN NATRIURETIC PEPTIDE - Abnormal; Notable for the following components:   B Natriuretic Peptide 173.1 (*)    All other components within normal limits  TROPONIN I (HIGH SENSITIVITY)  TROPONIN I (HIGH SENSITIVITY)    EKG EKG Interpretation  Date/Time:  Tuesday May 04 2022 13:15:28 EDT Ventricular Rate:  88 PR Interval:  54 QRS Duration: 110 QT Interval:  379 QTC Calculation: 459 R Axis:   113 Text Interpretation: Sinus rhythm Short PR interval Probable right ventricular hypertrophy No significant change since last tracing Confirmed by Fredia Sorrow [phone]) on 05/04/2022 2:10:26 PM  Radiology DG Chest 2 View  Result Date: 05/04/2022 CLINICAL DATA:  Shortness of breath. Worsening swelling. History  of CHF. EXAM: CHEST - 2 VIEW COMPARISON:  02/02/2022. FINDINGS: Unchanged large hiatal hernia with adjacent left basilar atelectasis. No new airspace opacity or edema. Stable cardiac and mediastinal contours. No pleural effusion or pneumothorax. IMPRESSION: No evidence of acute cardiopulmonary disease. Electronically Signed   By: Emmit Alexanders M.D.   On: 05/04/2022 14:09    Procedures Procedures    Medications Ordered in ED Medications  furosemide (LASIX) injection 40 mg (40 mg Intravenous Given 05/04/22 1420)  rOPINIRole (REQUIP) tablet 2 mg (2 mg Oral Given 05/04/22 1608)    ED Course/ Medical Decision Making/ A&P Clinical Course as of 05/04/22 2119  Tue May 04, 2022  1727 Brain natriuretic peptide(!) [BS]    Clinical Course User Index [BS] Willadean Guyton, Si Gaul, PA                             Medical Decision Making Patient is an 89 year old male, history of CHF, pulmonary hypertension, who presents to the ED secondary to bilateral lower extremity swelling, and concern for low oxygen.  He is satting fine on 100% on 4 L right now.  Wears 4 L as needed at home.  We will obtain chest x-ray, blood work, as well as EKG for further evaluation.  Amount and/or Complexity of Data Reviewed Labs: ordered. Decision-making details documented in ED Course.    Details: Mildly BNP, not normal troponin Radiology: ordered.    Details: Chest x-ray shows no pulmonary edema ECG/medicine tests:     Details: No acute changes Discussion of management or test interpretation with external provider(s): Discussed with cardiology Dr. Debara Pickett on-call, he recommends increasing the metolazone  to 5 mg for 1 day, and then 2.5 mg for 2 days and then continuing normal dosage of metolazone.  Close follow-up with CHF clinic.  I discussed this with the patient and his wife, I prescribed him metolazone to start tomorrow as he has taken his dose today, and we discussed strict return precautions, he is well-appearing, not requiring  more O2, speaking in full sentences, and is he uses his 4 L of O2 at home as needed.  You will need to follow-up with his cardiologist closely, and follow-up with his PCP in 1 week to have his labs rechecked.  Risk Prescription drug management.    Final Clinical Impression(s) / ED Diagnoses Final diagnoses:  SOB (shortness of breath)  Bilateral edema of lower extremity  Acute on chronic diastolic heart failure    Rx / DC Orders ED Discharge Orders          Ordered    metolazone (ZAROXOLYN) 2.5 MG tablet  Daily        05/04/22 1747              Jaloni Sorber, Harley Alto, PA 05/04/22 2121    Vanetta Mulders, MD 05/07/22 1627

## 2022-05-06 ENCOUNTER — Other Ambulatory Visit: Payer: Self-pay | Admitting: Adult Health

## 2022-05-08 ENCOUNTER — Other Ambulatory Visit: Payer: Self-pay | Admitting: Adult Health

## 2022-05-13 ENCOUNTER — Other Ambulatory Visit (HOSPITAL_COMMUNITY): Payer: Medicare HMO

## 2022-05-14 ENCOUNTER — Telehealth (HOSPITAL_COMMUNITY): Payer: Self-pay

## 2022-05-14 ENCOUNTER — Encounter (HOSPITAL_COMMUNITY): Payer: Self-pay | Admitting: Cardiology

## 2022-05-14 ENCOUNTER — Ambulatory Visit (HOSPITAL_COMMUNITY)
Admission: RE | Admit: 2022-05-14 | Discharge: 2022-05-14 | Disposition: A | Discharge status: Home / Self Care | Payer: Medicare HMO | Source: Ambulatory Visit | Attending: Cardiology | Admitting: Cardiology

## 2022-05-14 VITALS — BP 108/60 | HR 87 | Wt 185.2 lb

## 2022-05-14 DIAGNOSIS — Z79899 Other long term (current) drug therapy: Secondary | ICD-10-CM | POA: Insufficient documentation

## 2022-05-14 DIAGNOSIS — G2581 Restless legs syndrome: Secondary | ICD-10-CM | POA: Diagnosis not present

## 2022-05-14 DIAGNOSIS — I5032 Chronic diastolic (congestive) heart failure: Secondary | ICD-10-CM | POA: Insufficient documentation

## 2022-05-14 DIAGNOSIS — I480 Paroxysmal atrial fibrillation: Secondary | ICD-10-CM | POA: Insufficient documentation

## 2022-05-14 DIAGNOSIS — G4733 Obstructive sleep apnea (adult) (pediatric): Secondary | ICD-10-CM | POA: Diagnosis not present

## 2022-05-14 DIAGNOSIS — Z8673 Personal history of transient ischemic attack (TIA), and cerebral infarction without residual deficits: Secondary | ICD-10-CM | POA: Diagnosis not present

## 2022-05-14 DIAGNOSIS — N1832 Chronic kidney disease, stage 3b: Secondary | ICD-10-CM | POA: Insufficient documentation

## 2022-05-14 DIAGNOSIS — Z7901 Long term (current) use of anticoagulants: Secondary | ICD-10-CM | POA: Insufficient documentation

## 2022-05-14 DIAGNOSIS — I130 Hypertensive heart and chronic kidney disease with heart failure and stage 1 through stage 4 chronic kidney disease, or unspecified chronic kidney disease: Secondary | ICD-10-CM | POA: Diagnosis not present

## 2022-05-14 DIAGNOSIS — J849 Interstitial pulmonary disease, unspecified: Secondary | ICD-10-CM | POA: Insufficient documentation

## 2022-05-14 DIAGNOSIS — R0602 Shortness of breath: Secondary | ICD-10-CM | POA: Diagnosis not present

## 2022-05-14 DIAGNOSIS — R5383 Other fatigue: Secondary | ICD-10-CM | POA: Insufficient documentation

## 2022-05-14 DIAGNOSIS — I2721 Secondary pulmonary arterial hypertension: Secondary | ICD-10-CM | POA: Diagnosis not present

## 2022-05-14 LAB — CBC
HCT: 33.6 % — ABNORMAL LOW (ref 39.0–52.0)
Hemoglobin: 10.4 g/dL — ABNORMAL LOW (ref 13.0–17.0)
MCH: 25.7 pg — ABNORMAL LOW (ref 26.0–34.0)
MCHC: 31 g/dL (ref 30.0–36.0)
MCV: 83 fL (ref 80.0–100.0)
Platelets: 341 10*3/uL (ref 150–400)
RBC: 4.05 MIL/uL — ABNORMAL LOW (ref 4.22–5.81)
RDW: 16.4 % — ABNORMAL HIGH (ref 11.5–15.5)
WBC: 7.9 10*3/uL (ref 4.0–10.5)
nRBC: 0 % (ref 0.0–0.2)

## 2022-05-14 LAB — MAGNESIUM: Magnesium: 2.1 mg/dL (ref 1.7–2.4)

## 2022-05-14 LAB — BASIC METABOLIC PANEL
Anion gap: 14 (ref 5–15)
BUN: 126 mg/dL — ABNORMAL HIGH (ref 8–23)
CO2: 27 mmol/L (ref 22–32)
Calcium: 7.1 mg/dL — ABNORMAL LOW (ref 8.9–10.3)
Chloride: 92 mmol/L — ABNORMAL LOW (ref 98–111)
Creatinine, Ser: 1.88 mg/dL — ABNORMAL HIGH (ref 0.61–1.24)
GFR, Estimated: 34 mL/min — ABNORMAL LOW (ref >60)
Glucose, Bld: 114 mg/dL — ABNORMAL HIGH (ref 70–99)
Potassium: 3.6 mmol/L (ref 3.5–5.1)
Sodium: 133 mmol/L — ABNORMAL LOW (ref 135–145)

## 2022-05-14 LAB — BRAIN NATRIURETIC PEPTIDE: B Natriuretic Peptide: 118.3 pg/mL — ABNORMAL HIGH (ref 0.0–100.0)

## 2022-05-14 MED ORDER — METOLAZONE 2.5 MG PO TABS: 2.5000 mg | ORAL_TABLET | ORAL | 3 refills | Status: DC

## 2022-05-14 NOTE — Telephone Encounter (Signed)
Updated medication orders faxed to [facility] on [phone]

## 2022-05-14 NOTE — Patient Instructions (Signed)
INCREASE Metolazone to 2.5 mg every Monday, Wednesday and Friday, take an extra Potassium tablet on Metolazone days.  Labs done today, your results will be available in MyChart, we will contact you for abnormal readings.  Repeat bloodwork in 2 weeks  Your physician recommends that you schedule a follow-up appointment in: 1 month  If you have any questions or concerns before your next appointment please send [place] a message through [place] or call our office at [phone].    TO LEAVE A MESSAGE FOR THE NURSE SELECT OPTION 2, PLEASE LEAVE A MESSAGE INCLUDING: YOUR NAME DATE OF BIRTH CALL BACK NUMBER REASON FOR CALL**this is important as we prioritize the call backs  YOU WILL RECEIVE A CALL BACK THE SAME DAY AS LONG AS YOU CALL BEFORE 4:00 PM  At the [facility], you and your health needs are our priority. As part of our continuing mission to provide you with exceptional heart care, we have created designated Provider Care Teams. These Care Teams include your primary Cardiologist (physician) and Advanced Practice Providers (APPs- Physician Assistants and Nurse Practitioners) who all work together to provide you with the care you need, when you need it.   You may see any of the following providers on your designated Care Team at your next follow up: Dr Arvilla Meres Dr Marca Ancona Dr. Marcos Eke, NP Robbie Lis, [place] [facility] [place], [place] Brynda Peon, NP Karle Plumber, PharmD   Please be sure to bring in all your medications bottles to every appointment.    Thank you for choosing [facility]

## 2022-05-16 NOTE — Progress Notes (Signed)
PCP: Joycelyn Man, NP Cardiology: Dr. Shirlee Latch  89 y.o. with history of interstitial lung disease (?NSIP vs UIP) on home oxygen, pulmonary hypertension, diastolic CHF, and prior non-Hodgkins lymphoma (pelvic mass, s/p XRT) presents for followup of CHF and pulmonary hypertension.  Patient has history of reportedly mild ILD (NSIP vs UIP) followed by a pulmonologist at [facility] and pulmonary hypertension thought to be out of proportion lung disease followed by a physician at [facility].  Last CT chest was in 11/19, showing stable ILD (?UIP vs NSIP).  He had RHC in 2/20 with moderate PAH, PVR 5.1 WU.  He was tried on Adcirca but developed cognitive problems and worsening of peripheral edema so this was stopped.     Echo 06/20 showed normal LV size and systolic function, RV reportedly normal.  6/20 RHC showed normal filling pressures with moderate pulmonary hypertension, PVR 5.35 WU. After discharge from hospital, patient started on ambrisentan.  His weight steadily began to increase and he developed worsening lower extremity edema, we stopped ambrisentan. He has been able to tolerate Uptravi.  He was admitted in 3/21 with suspected CVA, MRI head showed punctate occipital lobe infarct, thought to be due to small vessel disease. ILR was implanted.  Echo in 3/21 showed EF 60-65%, severe LVH, normal RV.  PYP scan in 3/21 was not suggestive of TTR amyloidosis.  Subsequently, ILR has shown paroxysmal atrial fibrillation.   Echo in 6/22 showed EF 65-70% with mild LVH, normal RV, IVC normal, PASP estimated 29 mmHg.   Patient was admitted in 9/22 with upper GI bleed from bleeding gastric polyps.  He had polypectomy and has been restarted on Eliquis.   He was seen in the ER with atrial fibrillation in 12/22.    Echo in 7/23 showed EF 60-65%, RV normal, PASP estimation was not elevated, IVC normal.   Seen 10/23 for an acute visit when BP 70's and HR 44 at Neurology visit. Kidney function elevated and  metolazone decreased back to once a week. Midodrine added.  Patient admitted in 1/24 with RV failure and UTI. Had not been taking his diuretics as prescribed (especially PM doses). He was diuresed with IV lasix and restarted on home torsemide and weekly metolazone. Echo during admit with EF 65-70%, D shaped septum, mildly dilated RV with mild RV dysfunction. RHC showed normal filling pressures and mild pulmonary venous hypertension after diuresis.  Patient was seen in the ER 05/04/22 with dyspnea and increased edema. He was given extra metolazone for several days with good diuresis.   He returns for followup of CHF. He is accompanied by his wife. Wearing home oxygen.  Weight is up 1 lb. He has fatigue and shortness of breath with moderate exertion.  This is unchanged.  He can walk in the hall at his facility for about 75 yards before he gets short of breath and has to rest.  He has been working with PT.  No orthopnea/PND.  Main complaint is actually not dyspnea, rather it is severe restless leg syndrome, hard for him to sleep.  He is going to be seen in a pain clinic.   6 minute walk (8/21): 122 m 6 minute walk (11/21): 244 m 6 minute walk (3/22): 122 m  Labs (2/24): K 4, creatinine 1.61  ECG (personally reviewed): NSR, PACs/PVCs, iRBBB  PMH: 1. Non-Hodgkin's lymphoma: Pelvic involvement.  S/p radiation.  Thought to be in remission (Dr. Truett Perna).  2. HTN 3. PACs/PVCs 4. GERD: Barrett's esophagus.  5. Pulmonary fibrosis:  NSIP vs UIP.  - High resolution CT chest 11/19: ILD, ?UIP pattern.  6. Pulmonary hypertension: Thought to be out of proportion to underlying lung disease (ILD, ?NSIP vs UIP).   - RHC (2/20): mean RA 10, PASP 60 with mean 42, mean PCWP 11, CI 3, PVR 5.1 WU.  - Trial of Adcirca => failed due to ?memory worsening/cognitive affects and peripheral edema.  - Echo (6/20): EF 60-65%, mild LVH, RV reportedly normal, PASP 59 mmHg.  - RHC (6/20): mean RA 7, PA 50/24 mean 34, mean PCWP  12, CI 2.01, PAPI 3.7, PVR 5.35 WU - RF negative, ANA negative, anti-SCL70 negative, anti-centromere negative.  - V/Q scan (8/20): No evidence for chronic PE.  - Echo (3/21): EF 60-65%, severe LVH, normal RV.  - Echo (6/22): EF 65-70% with mild LVH, normal RV, IVC normal, PASP estimated 29 mmHg. - Echo (7/23): EF 60-65%, RV normal, PASP estimation was not elevated, IVC normal.  - Echo (1/24): EF 65-70% with D-shaped septum, mild RV dilation/dysfunction, dilated IVC - RHC (1/24): mean RA 6, PA 45/22 mean 32, mean PCWP 14, CI 3.99, PVR 2.4 WU.  7. CKD: Stage 3.   8. Wandering atrial pacemaker 9. OSA 10. PYP scan (3/21): H/CL ratio 1.0, grade 0.  11. CVA: MRI 3/21 with punctate occipital lobe infarct thought to be due to small vessel disease. - Carotid dopplers (3/21): 40-59% RICA stenosis.  - Carotid dopplers (4/22): 40-59% RICA stenosis.  - Carotid dopplers (4/23): 40-59% RICA stenosis.  12. Atrial fibrillation: Paroxysmal, noted on ILR.  13. GI bleeding 9/22 from gastric polyps.  14. Restless leg syndrome  Social History   Socioeconomic History   Marital status: Married    Spouse name: Not on file   Number of children: 2   Years of education: Not on file   Highest education level: Not on file  Occupational History   Occupation: retired    Comment: [employer]  Tobacco Use   Smoking status: Former    Packs/day: 1.00    Years: 35.00    Additional pack years: 0.00    Total pack years: 35.00    Types: Pipe, Cigarettes    Quit date: 02/23/1992    Years since quitting: 30.2   Smokeless tobacco: Never   Tobacco comments:    quit 20 years ago  Vaping Use   Vaping Use: Never used  Substance and Sexual Activity   Alcohol use: Not Currently    Comment: 2 drinks daily scotch  AND WINE WITH SUPPER   Drug use: No   Sexual activity: Not on file  Other Topics Concern   Not on file  Social History Narrative   Married - second marriage   He has two children    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   Currently teaches pottery making, 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[facility]   Former Smoker quit 12 years ago- smoked for 35 years   Alcohol use- not currently   Right handed   Social Determinants of Health   Financial Resource Strain: Low Risk  (02/08/2022)   Overall Financial Resource Strain (CARDIA)    Difficulty of Paying Living Expenses: Not hard at all  Food Insecurity: No Food Insecurity (02/08/2022)   Hunger Vital Sign    Worried About Running Out of Food in the Last Year: Never true    Ran Out of Food in the Last Year: Never true  Transportation Needs: No Transportation Needs (02/08/2022)   PRAPARE - Transportation  Lack of Transportation (Medical): No    Lack of Transportation (Non-Medical): No  Physical Activity: Not on file  Stress: Not on file  Social Connections: Not on file  Intimate Partner Violence: Not At Risk (02/08/2022)   Humiliation, Afraid, Rape, and Kick questionnaire    Fear of Current or Ex-Partner: No    Emotionally Abused: No    Physically Abused: No    Sexually Abused: No   Family History  Problem Relation Age of Onset   Dementia Mother    Heart disease Father        MI 48   Urticaria Father    Restless legs syndrome Father    Dementia Sister    Parkinson's disease Sister    ALS Sister    Prostate cancer Brother    Prostate cancer Paternal Uncle    Prostate cancer Paternal Uncle    Prostate cancer Paternal Uncle    Hyperlipidemia Other    Stroke Other    Hypertension Other    ADD / ADHD Other    Colon cancer Neg Hx    Allergic rhinitis Neg Hx    Asthma Neg Hx    Eczema Neg Hx    ROS: All systems reviewed and negative except as per HPI.   Current Outpatient Medications  Medication Sig Dispense Refill   acetaminophen (TYLENOL) 500 MG tablet Take 500 mg by mouth every 6 (six) hours as needed for mild pain or moderate pain.     atorvastatin (LIPITOR) 40 MG tablet Take 1 tablet (40 mg total) by mouth daily at 6 PM. 30  tablet 1   cyanocobalamin (,VITAMIN B-12,) 1000 MCG/ML injection Inject 1,000 mcg into the muscle every 30 (thirty) days.     ELIQUIS 2.5 MG TABS tablet TAKE ONE TABLET TWICE DAILY 60 tablet 11   escitalopram (LEXAPRO) 10 MG tablet Take 10 mg by mouth in the morning.     finasteride (PROSCAR) 5 MG tablet Take 5 mg by mouth every evening.     FLONASE SENSIMIST 27.5 MCG/SPRAY nasal spray Place 1 spray into the nose daily as needed for rhinitis or allergies.      gabapentin (NEURONTIN) 100 MG capsule TAKE ONE CAPSULE AT BEDTIME 30 capsule 1   guaiFENesin (MUCINEX) 600 MG 12 hr tablet Take 600 mg by mouth in the morning.     HYDROCODONE-ACETAMINOPHEN PO Take 1 tablet by mouth every 6 (six) hours as needed.     Iron, Ferrous Sulfate, 325 (65 Fe) MG TABS Take 325 mg by mouth daily. 30 tablet 4   Loperamide HCl (IMODIUM PO) Take 2 tablets by mouth daily.     metoprolol succinate (TOPROL-XL) 25 MG 24 hr tablet TAKE ONE-HALF TABLET AT BEDTIME 15 tablet 3   midodrine (PROAMATINE) 5 MG tablet Take 2 tablets (10 mg total) by mouth 3 (three) times daily with meals. 90 tablet 11   Multiple Vitamins-Minerals (PRESERVISION AREDS 2) CAPS Take 1 capsule by mouth 2 (two) times daily.     omeprazole (PRILOSEC) 20 MG capsule Take 1 capsule (20 mg total) by mouth 2 (two) times daily before a meal. 60 capsule 2   ondansetron (ZOFRAN-ODT) 4 MG disintegrating tablet Take 1 tablet (4 mg total) by mouth every 8 (eight) hours as needed for nausea or vomiting. 20 tablet 0   OXYGEN Inhale 2 L/min into the lungs. As needed and at night     potassium chloride SA (KLOR-CON M) 20 MEQ tablet Take 4 tablets (80 mEq total) by  mouth in the morning AND 2 tablets (40 mEq total) every evening. 180 tablet 6   Riociguat (ADEMPAS) 2.5 MG TABS Take 2.5 mg by mouth 3 (three) times daily. 90 tablet 11   rOPINIRole (REQUIP) 2 MG tablet TAKE ONE TABLET THREE TIMES DAILY 90 tablet 4   Selexipag (UPTRAVI) 1600 MCG TABS Take 1,600 mcg by mouth 2  (two) times daily. 0800 and 1900     Selexipag 400 MCG TABS Take 400 mcg by mouth 2 (two) times daily. 0800 and 1900     torsemide (DEMADEX) 100 MG tablet TAKE ONE TABLET TWICE DAILY 180 tablet 3   TRAZODONE HCL ER PO Take 1 tablet by mouth at bedtime.     metolazone (ZAROXOLYN) 2.5 MG tablet Take 1 tablet (2.5 mg total) by mouth 3 (three) times a week. Every Monday, Wednesday and Friday 35 tablet 3   No current facility-administered medications for this encounter.   BP 108/60   Pulse 87   Wt 84 kg (185 lb 3.2 oz)   SpO2 91%   BMI 29.89 kg/m   Wt Readings from Last 3 Encounters:  05/14/22 84 kg (185 lb 3.2 oz)  05/04/22 83.9 kg (185 lb)  04/05/22 83.5 kg (184 lb)   General: NAD Neck: JVP 8-9 cm with HJR, no thyromegaly or thyroid nodule.  Lungs: Mild crackles at bases. CV: Nondisplaced PMI.  Heart regular S1/S2, no S3/S4, no murmur.  2+ edema 1/2 to knees bilaterally.  No carotid bruit.  Normal pedal pulses.  Abdomen: Soft, nontender, no hepatosplenomegaly, no distention.  Skin: Intact without lesions or rashes.  Neurologic: Alert and oriented x 3.  Psych: Normal affect. Extremities: No clubbing or cyanosis.  HEENT: Normal.   Assessment/Plan: 1.Chronic diastolic CHF: Suspect with significant component of RV failure from pulmonary hypertension.  Echo in 6/20 with EF 60-65%, RV not well-visualized but PASP 59 mmHg.  Echo (3/21) with EF 60-65%, severe LVH, normal RV, PASP 28 mmHg.  PYP scan was not suggestive of TTR cardiac amyloidosis. Echo in 6/22 showed EF 65-70% with mild LVH, normal RV, IVC normal, PASP estimated 29 mmHg.  Admit 01/24 with volume overload in setting of noncompliance with diuretics. Echo 01/24 with EF 65-70% with D-shaped septum, mild RV dilation/dysfunction, dilated IVC. RHC 1/24 showed normal filling pressures with mild pulmonary venous hypertension after diuresis. NYHA class III symptoms, he is volume overloaded on exam still.   - Continue torsemide 100 mg bid.    - Increase metolazone to three days a week, Mon/Wed/Fri.  BMET/BNP today and BMET in 10 days.  - Continue KCl 80 qam/40 qpm with extra 40 KCl on metolazone days.  - On midodrine 10 TID 2. CKD Stage 3b: BMET today. 3. Pulmonary hypertension: Patient thought to have PAH out of proportion to his lung disease (ILD, NSIP versus UIP). RHC in 2/20 showed mean PA pressure 41, PVR 5.1 WU, CI 3. Echo in 6/20 with RV reportedly ok => I reviewed and do not think that RV was well-visualized.  Patient did not tolerate Adcirca due to edema/?cognitive effects.  RHC was done again in 6/20, showed moderate pulmonary arterial hypertension with PVR 5.35 (comparable to prior).  Serological workup for PH was negative. Patient started ambrisentan 5 mg and developed significant edema/volume overload despite a high dose of torsemide. He is now on Uptravi + riociguat.  RHC in 1/24 after diuresis showed mild pulmonary venous hypertension with PVR 2.4 WU.  - He will stay off ambrisentan (? Prior trigger  for volume overload).  - As above, he did not tolerate tadalafil.  - He is at 2000 mcg bid Uptravi.   - Continue riociguat at goal dose 2.5 mg tid.  4. Atrial fibrillation: Paroxysmal, noted on ILR. Remains in NSR.  - Continue apixaban 2.5 mg bid (dosed for age, renal dysfunction).  5. OSA: Cannot tolerate CPAP.  6. CVA: In 3/21, may have been atrial fibrillation-related.  On apixaban now.  7. Carotid stenosis: Carotid duplex 04/23: 40-59% right ICA stenosis, no significant stenosis on the left - Continue statin. - Order carotid dopplers at next appt.  8. Interstitial lung disease:  Has seen pulmonary, ILD seems mild and stable.   Follow-up: 1 month with APP.   Marca Ancona  05/16/2022

## 2022-05-19 ENCOUNTER — Other Ambulatory Visit (HOSPITAL_COMMUNITY): Payer: Self-pay | Admitting: Cardiology

## 2022-05-25 ENCOUNTER — Ambulatory Visit (HOSPITAL_COMMUNITY)
Admission: RE | Admit: 2022-05-25 | Discharge: 2022-05-25 | Disposition: A | Discharge status: Home / Self Care | Payer: Medicare HMO | Source: Ambulatory Visit | Attending: Cardiology | Admitting: Cardiology

## 2022-05-25 DIAGNOSIS — I6521 Occlusion and stenosis of right carotid artery: Secondary | ICD-10-CM | POA: Diagnosis present

## 2022-05-26 ENCOUNTER — Telehealth (HOSPITAL_COMMUNITY): Payer: Self-pay

## 2022-05-26 NOTE — Telephone Encounter (Signed)
Patient's wife notified of [place] results

## 2022-05-27 ENCOUNTER — Ambulatory Visit (HOSPITAL_COMMUNITY)
Admission: RE | Admit: 2022-05-27 | Discharge: 2022-05-27 | Disposition: A | Discharge status: Home / Self Care | Payer: Medicare HMO | Source: Ambulatory Visit | Attending: Cardiology | Admitting: Cardiology

## 2022-05-27 DIAGNOSIS — I5032 Chronic diastolic (congestive) heart failure: Secondary | ICD-10-CM | POA: Insufficient documentation

## 2022-05-27 LAB — BASIC METABOLIC PANEL
Anion gap: 13 (ref 5–15)
BUN: 125 mg/dL — ABNORMAL HIGH (ref 8–23)
CO2: 29 mmol/L (ref 22–32)
Calcium: 7.4 mg/dL — ABNORMAL LOW (ref 8.9–10.3)
Chloride: 91 mmol/L — ABNORMAL LOW (ref 98–111)
Creatinine, Ser: 2.36 mg/dL — ABNORMAL HIGH (ref 0.61–1.24)
GFR, Estimated: 26 mL/min — ABNORMAL LOW (ref >60)
Glucose, Bld: 131 mg/dL — ABNORMAL HIGH (ref 70–99)
Potassium: 3.8 mmol/L (ref 3.5–5.1)
Sodium: 133 mmol/L — ABNORMAL LOW (ref 135–145)

## 2022-05-28 ENCOUNTER — Telehealth (HOSPITAL_COMMUNITY): Payer: Self-pay

## 2022-05-28 NOTE — Telephone Encounter (Signed)
Patient's wife notified of information on labs and medication changes.  Left message for his nurse at [facility] concerning medication changes.

## 2022-05-31 ENCOUNTER — Ambulatory Visit (INDEPENDENT_AMBULATORY_CARE_PROVIDER_SITE_OTHER): Payer: Medicare HMO

## 2022-05-31 DIAGNOSIS — I639 Cerebral infarction, unspecified: Secondary | ICD-10-CM | POA: Diagnosis not present

## 2022-05-31 LAB — CUP PACEART REMOTE DEVICE CHECK
Date Time Interrogation Session: 20240426230305
Implantable Pulse Generator Implant Date: 20210325

## 2022-06-03 ENCOUNTER — Ambulatory Visit: Payer: Medicare HMO | Admitting: Adult Health

## 2022-06-03 VITALS — BP 108/61 | HR 81 | Ht 66.0 in | Wt 183.0 lb

## 2022-06-03 DIAGNOSIS — G2581 Restless legs syndrome: Secondary | ICD-10-CM | POA: Diagnosis not present

## 2022-06-03 MED ORDER — GABAPENTIN 100 MG PO CAPS: 100.0000 mg | ORAL_CAPSULE | Freq: Two times a day (BID) | ORAL | 5 refills | Status: DC

## 2022-06-03 NOTE — Progress Notes (Signed)
PATIENT: Ryan [name] DOB: Jun 01, 1931  REASON FOR VISIT: follow up HISTORY FROM: patient  Chief Complaint  Patient presents with   Follow-up    Pt in 18 Pt here for RLS f/u Pt states RLS  is continuous. Pt states very anxious Both pt feet are swollen Pt states requip is not working well  Pt states taking Ativan and seems to help RLS       HISTORY OF PRESENT ILLNESS:  TODAY 06/03/22: Ryan [name] is a 89 y.o. male with a history of RLS. Returns today for follow-up. Reports that RLS is worse. Taking Requip every 4 hours. Reports that PCP gave ativan and that helped. Reports RLS varies-sometimes is worse during the daytime other times it is at night.  He states that he does not sleep well due to restless leg and having to get up to urinate.  Patient also has multiple other complaints today-reports that he jarred his back on the way in today due to the [place] ride.  Tried Requip, Mirapex, gabapentin, lyrica, neupro   HISTORY:  11/05/2021: Ryan [name] is a 89 year old male with a history of RLS. He returns today for followup. Reports that he  Takes Requip every 6 hours. If he has break through pain he sometimes can take a walk or take a hot shower and that has been helping. Overall RLS is much improved.  Patient reports that current dose of requip has given him the most benefit and quality of life.  He is not interested in reducing his dose.  He does understand the risk of taking too much Requip.  Today the patient's blood pressure and heart rate is low.  He reports that his primary care increased metolazone to twice a week.  His wife reports that they have not seen cardiology in quite some time.  He denies feeling lightheaded or dizzy.  With the exception that he feels dizziness with standing.   Few weeks ago felt that he had a firecracker go off in his head. For several minutes he felt out of it but then returned to his baseline. No additional symptoms. No other episodes.      04/29/21: Ryan [name] is an 89 year old male with a history of RLS. He returns today for follow-up. Reports that his symptoms are still manageable. Continues to space out my 7 hours hours,typically taking 3-3.5 tablets daily. Sleeping varies- states that he is lucky if he sleeps 2 hours straight before he needs to go to the bathroom. Iron infusion last year resulted in a reaction so it had to be stopped. Uses Iron tablets now.  Continues to see cardiology for congestive heart failure.  He returns today for an evaluation.     10/30/20: Ryan [name] is an 89 year old male with a history of restless leg syndrome.  He returns today for follow-up.  The patient states that Requip 2 mg 3 times a day is working well for him.  He tries to space his dosing out by 7 hours.  States that if he tries to go longer he has breakthrough symptoms.  Typically will have an attack between 6 PM and midnight.  This is typically when he takes his third tablet.  He reports on occasion he may have to take a 4th tablet for breakthrough pain but this is not common.  The patient was recently in the hospital for anemia.  He received blood transfusion while there.  He will receive an iron transfusion next  week.  He returns today for an evaluation.  06/25/20: Ryan [name] is an 89 year old male with a history of restless leg syndrome and chronic back pain.  He returns today for follow-up.  He is currently on Neupro 3 mg patch.  He was originally told to only take Requip 2 mg twice a day for breakthrough pain.  The patient was taking Requip 2 mg 3 times a day in addition to the Neupro patch.  Patient was advised to a telephone conversation that this was too much medication.  His dose of Requip was reduced to 0.5 mg twice a day.  He remains on Neupro 3 mg patch.  The patient does not feel that Neupro has offered him much benefit.  He would like to go back to Requip.  The patient's iron level and ferritin level was low in April.  Lab work  was completed by Dr. Alford Highland office.  According to notes the patient was placed to have an iron infusion however the patient states that he was not aware of this and has been taking oral iron supplements.  Patient has a history of chronic back pain with previous surgery through Dr. Verlee Rossetti office.  He also reports that he was receiving injections with Dr. Danielle Dess but did not find them beneficial.  He has not followed up with Dr. Danielle Dess.  He reports that he has approximately 2 restless leg attacks during the day or night.  He states that his legs feel jumpy and painful.  He also reports that during this time he notices some back pain.  Usually a hot shower offers him some benefit.  Reports that he is not sleeping well at night.  Returns today for an evaluation.  04/14/20: Ryan [name] is an 89 year old male with a history of restless leg syndrome and chronic back pain.  He returns today for follow-up.  He reports that he has not been taking the Neupro consistently.  Reports early on he would only place the patch on if he felt that he needed it.  He states for the last week he has been placing it consistently.  He takes Requip 2 mg up to 3 times a day.  He states that typically between 10 and 11 PM his symptoms will begin.  He describes it as a pins and needle sensation in the legs.  He states the hot shower used to help but it no longer does.  He states that the sensation is usually stop by 1 AM.  In the past he has been on gabapentin but was unable to tolerate it.  Patient is currently being monitored for his kidney function.  12/10/19: Ryan [name] is an 89 year old male with a history of restless leg syndrome and chronic back pain.  He returns today for follow-up.  The patient has been taking Requip and gabapentin but reports it does not offer him much benefit for his restless legs.  He states he can be up 3-4 times at night.  He also has symptoms throughout the day.  He states that gabapentin makes him very  drowsy.  He has not taken it for the last 2 nights.  His B12 levels were low back in April.  He has been taking monthly B12 injections but has not had a shot in at least 2 months.  He returns today for an evaluation.   10/18/19:   Mr. Filsinger is an 89 year old male with a history of restless leg syndrome, CVA and chronic back pain.  He returns  today to discuss restless legs.  He states that he is currently taking Requip 2 mg 3 times a day.  He states that he tries to take them 8 hours apart.  He states that he has been waking up during the night with symptoms.  He states that this can occur anytime between 10 and 12 and sometimes will last till 4 in the morning.  He states that a hot shower is sometimes beneficial.  Initially tramadol offered him the most benefit but it no longer works.  He states that he does not have any symptoms during the day.  He returns today for an evaluation.  REVIEW OF SYSTEMS: Out of a complete 14 system review of symptoms, the patient complains only of the following symptoms, and all other reviewed systems are negative.  See HPI  ALLERGIES: Allergies  Allergen Reactions   Pollen Extract-Tree Extract Other (See Comments)    "HEADACHES, TIRED, DRAINAGE FROM SINUSES"   Feraheme [Ferumoxytol] Other (See Comments)    Chest pain, facial flushing, shortness of breath   Molds & Smuts Other (See Comments)    Also dust mites causes sinus infections, h/a etc.    HOME MEDICATIONS: Outpatient Medications Prior to Visit  Medication Sig Dispense Refill   acetaminophen (TYLENOL) 500 MG tablet Take 500 mg by mouth every 6 (six) hours as needed for mild pain or moderate pain.     atorvastatin (LIPITOR) 40 MG tablet Take 1 tablet (40 mg total) by mouth daily at 6 PM. 30 tablet 1   cyanocobalamin (,VITAMIN B-12,) 1000 MCG/ML injection Inject 1,000 mcg into the muscle every 30 (thirty) days.     ELIQUIS 2.5 MG TABS tablet TAKE ONE TABLET TWICE DAILY 60 tablet 11   escitalopram  (LEXAPRO) 10 MG tablet Take 10 mg by mouth in the morning.     finasteride (PROSCAR) 5 MG tablet Take 5 mg by mouth every evening.     FLONASE SENSIMIST 27.5 MCG/SPRAY nasal spray Place 1 spray into the nose daily as needed for rhinitis or allergies.      gabapentin (NEURONTIN) 100 MG capsule TAKE ONE CAPSULE AT BEDTIME 30 capsule 1   guaiFENesin (MUCINEX) 600 MG 12 hr tablet Take 600 mg by mouth in the morning.     HYDROCODONE-ACETAMINOPHEN PO Take 1 tablet by mouth every 6 (six) hours as needed.     Iron, Ferrous Sulfate, 325 (65 Fe) MG TABS Take 325 mg by mouth daily. 30 tablet 4   Loperamide HCl (IMODIUM PO) Take 2 tablets by mouth daily.     metolazone (ZAROXOLYN) 2.5 MG tablet Take 1 tablet (2.5 mg total) by mouth 3 (three) times a week. Every Monday, Wednesday and Friday 35 tablet 3   metoprolol succinate (TOPROL-XL) 25 MG 24 hr tablet TAKE 1/2 TABLET AT BEDTIME 15 tablet 3   midodrine (PROAMATINE) 5 MG tablet Take 2 tablets (10 mg total) by mouth 3 (three) times daily with meals. 90 tablet 11   Multiple Vitamins-Minerals (PRESERVISION AREDS 2) CAPS Take 1 capsule by mouth 2 (two) times daily.     omeprazole (PRILOSEC) 20 MG capsule Take 1 capsule (20 mg total) by mouth 2 (two) times daily before a meal. 60 capsule 2   ondansetron (ZOFRAN-ODT) 4 MG disintegrating tablet Take 1 tablet (4 mg total) by mouth every 8 (eight) hours as needed for nausea or vomiting. 20 tablet 0   OXYGEN Inhale 2 L/min into the lungs. As needed and at night  potassium chloride SA (KLOR-CON M) 20 MEQ tablet Take 4 tablets (80 mEq total) by mouth in the morning AND 2 tablets (40 mEq total) every evening. 180 tablet 6   Riociguat (ADEMPAS) 2.5 MG TABS Take 2.5 mg by mouth 3 (three) times daily. 90 tablet 11   rOPINIRole (REQUIP) 2 MG tablet TAKE ONE TABLET THREE TIMES DAILY 90 tablet 4   Selexipag (UPTRAVI) 1600 MCG TABS Take 1,600 mcg by mouth 2 (two) times daily. 0800 and 1900     Selexipag 400 MCG TABS Take 400  mcg by mouth 2 (two) times daily. 0800 and 1900     torsemide (DEMADEX) 100 MG tablet TAKE ONE TABLET TWICE DAILY 180 tablet 3   TRAZODONE HCL ER PO Take 1 tablet by mouth at bedtime. (Patient not taking: Reported on 06/03/2022)     No facility-administered medications prior to visit.    PAST MEDICAL HISTORY: Past Medical History:  Diagnosis Date   Asthma    Barrett's esophagus    Benign prostatic hypertrophy    Bilateral lower extremity edema    CHF (congestive heart failure) (HCC)    Chronic fatigue    COPD (chronic obstructive pulmonary disease) (HCC)    Diabetes mellitus type 2 in nonobese (HCC) 04/24/2019   Diverticulosis of colon (without mention of hemorrhage)    Esophageal reflux    Gastritis    Gluten intolerance    Hiatal hernia 02/10/2011   Noted on CT Scan - Moderate Hiatal Hernia   Hyperlipemia    Hypertension    Lymphoma of lymph nodes in pelvis (HCC) 03/03/2011    Large Right Retroperitoneal Mass   Melanoma (HCC)    lymphoma   Microcytic anemia 04/20/2011   NHL (non-Hodgkin's 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 Loreta Ave   Pulmonary hyperinflation    Renal cyst 02/10/2011    Noted on CT Scan - Bilateral Renal Cysts   S/P radiation therapy 03/15/11 - 04/09/11   Abdominal/ Pelvic Tumor, 3600 cGy/20 Fractions   Sleep apnea     PAST SURGICAL HISTORY: Past Surgical History:  Procedure Laterality Date   ARTHROSCOPIC REPAIR ACL     right   BONE MARROW ASPIRATION  02/25/11   Bone Marrow, Aspirate, Clot, and Bilateral Bx, Right PIC   CATARACT EXTRACTION, BILATERAL     ESOPHAGOGASTRODUODENOSCOPY (EGD) WITH PROPOFOL N/A 10/19/2020   Procedure: ESOPHAGOGASTRODUODENOSCOPY (EGD) WITH PROPOFOL;  Surgeon: Imogene Burn, MD;  Location[facility] ENDOSCOPY;  Service: Gastroenterology;  Laterality:  N/A;   EYE SURGERY     HEMOSTASIS CLIP PLACEMENT  10/19/2020   Procedure: HEMOSTASIS CLIP PLACEMENT;  Surgeon: Imogene Burn, MD;  Location: [facility] ENDOSCOPY;  Service: Gastroenterology;;   HERNIA REPAIR     LEFT INGUINAL    INSERTION OF MESH N/A 08/23/2012   Procedure: INSERTION OF MESH;  Surgeon: Lodema Pilot, DO;  Location: [facility] ORS;  Service: General;  Laterality: N/A;   LOOP RECORDER INSERTION N/A 04/26/2019   Procedure: LOOP RECORDER INSERTION;  Surgeon: Marinus Maw, MD;  Location: [facility] INVASIVE CV LAB;  Service: Cardiovascular;  Laterality: N/A;   LUMBAR LAMINECTOMY/DECOMPRESSION MICRODISCECTOMY Right 12/31/2013   Procedure: RIGHT L4-5 L5-S1 LAMINECTOMY;  Surgeon: Barnett Abu, MD;  Location: [facility] NEURO ORS;  Service: Neurosurgery;  Laterality: Right;  RIGHT L4-5 L5-S1 LAMINECTOMY   POLYPECTOMY  10/19/2020   Procedure: POLYPECTOMY;  Surgeon: Imogene Burn, MD;  Location: [facility] ENDOSCOPY;  Service: Gastroenterology;;   RIGHT HEART CATH N/A 07/25/2018   Procedure: RIGHT HEART CATH;  Surgeon: Laurey Morale, MD;  Location: [facility] INVASIVE CV LAB;  Service: Cardiovascular;  Laterality: N/A;   RIGHT HEART CATH N/A 02/05/2022   Procedure: RIGHT HEART CATH;  Surgeon: Laurey Morale, MD;  Location: [facility] INVASIVE CV LAB;  Service: Cardiovascular;  Laterality: N/A;   ROTATOR CUFF REPAIR     right   TONSILLECTOMY     TONSILLECTOMY     VENTRAL HERNIA REPAIR N/A 08/23/2012   Procedure: HERNIA REPAIR VENTRAL ADULT;  Surgeon: Lodema Pilot, DO;  Location: [facility] ORS;  Service: General;  Laterality: N/A;    FAMILY HISTORY: Family History  Problem Relation Age of Onset   Dementia Mother    Heart disease Father        MI 20   Urticaria Father    Restless legs syndrome Father    Dementia Sister    Parkinson's disease Sister    ALS Sister    Prostate cancer Brother    Prostate cancer Paternal Uncle    Prostate cancer Paternal Uncle    Prostate cancer Paternal Uncle    Hyperlipidemia Other    Stroke Other     Hypertension Other    ADD / ADHD Other    Colon cancer Neg Hx    Allergic rhinitis Neg Hx    Asthma Neg Hx    Eczema Neg Hx     SOCIAL HISTORY: Social History   Socioeconomic History   Marital status: Married    Spouse name: Not on file   Number of children: 2   Years of education: Not on file   Highest education level: Not on file  Occupational History   Occupation: retired    Comment: [employer]  Tobacco Use   Smoking status: Former    Packs/day: 1.00    Years: 35.00    Additional pack years: 0.00    Total pack years: 35.00    Types: Pipe, Cigarettes    Quit date: 02/23/1992    Years since quitting: 30.2   Smokeless tobacco: Never   Tobacco comments:    quit 20 years ago  Vaping Use   Vaping Use: Never used  Substance and Sexual Activity   Alcohol use: Not Currently    Comment: 2 drinks daily scotch  AND WINE WITH SUPPER   Drug use: No   Sexual activity: Not on file  Other Topics Concern   Not on file  Social History Narrative   Married - second marriage   He has two children   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   Currently teaches pottery making, 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[facility]   Former Smoker quit 12 years ago- smoked for 35 years   Alcohol use- not currently   Right handed   Social Determinants of Health   Financial Resource Strain: Low Risk  (02/08/2022)   Overall Financial Resource Strain (CARDIA)    Difficulty of Paying Living Expenses: Not hard at all  Food Insecurity: No Food Insecurity (02/08/2022)   Hunger Vital Sign    Worried About Running Out of Food in the Last Year: Never true    Ran Out of Food in the Last Year: Never true  Transportation Needs: No Transportation Needs (02/08/2022)   PRAPARE - Administrator, Civil Service (Medical): No  Lack of Transportation (Non-Medical): No  Physical Activity: Not on file  Stress: Not on file  Social Connections: Not on file  Intimate Partner Violence: Not At Risk  (02/08/2022)   Humiliation, Afraid, Rape, and Kick questionnaire    Fear of Current or Ex-Partner: No    Emotionally Abused: No    Physically Abused: No    Sexually Abused: No      PHYSICAL EXAM  Vitals:   06/03/22 1053  BP: 108/61  Pulse: 81  Weight: 183 lb (83 kg)  Height: 5\' 6"  (1.676 m)   Body mass index is 29.54 kg/m.  Generalized: Well developed, in no acute distress   Neurological examination  Mentation: Alert oriented to time, place, history taking. Follows all commands speech and language fluent Cranial nerve II-XII: Pupils were equal round reactive to light. Extraocular movements were full, visual field were full on confrontational test.  Head turning and shoulder shrug  were normal and symmetric. Motor: The motor testing reveals 5 over 5 strength of all 4 extremities. Good symmetric motor tone is noted throughout. 4+ pitting edema bilateral lower extremities Sensory: Sensory testing is intact to soft touch on all 4 extremities. No evidence of extinction is noted.  Coordination: Cerebellar testing reveals good finger-nose-finger and heel-to-shin bilaterally.  Gait and station: Uses a Rollator when ambulating. Reflexes: Deep tendon reflexes are symmetric and normal bilaterally.   DIAGNOSTIC DATA (LABS, IMAGING, TESTING) - I reviewed patient records, labs, notes, testing and imaging myself where available.  Lab Results  Component Value Date   WBC 7.9 05/14/2022   HGB 10.4 (L) 05/14/2022   HCT 33.6 (L) 05/14/2022   MCV 83.0 05/14/2022   PLT 341 05/14/2022      Component Value Date/Time   NA 133 (L) 05/27/2022 1329   NA 139 12/10/2019 1007   NA 143 06/26/2015 0916   K 3.8 05/27/2022 1329   K 3.8 06/26/2015 0916   CL 91 (L) 05/27/2022 1329   CL 105 04/07/2012 0925   CO2 29 05/27/2022 1329   CO2 32 (H) 06/26/2015 0916   GLUCOSE 131 (H) 05/27/2022 1329   GLUCOSE 102 06/26/2015 0916   GLUCOSE 86 04/07/2012 0925   BUN 125 (H) 05/27/2022 1329   BUN 56 (H)  12/10/2019 1007   BUN 14.0 06/26/2015 0916   CREATININE 2.36 (H) 05/27/2022 1329   CREATININE 1.0 06/26/2015 0916   CALCIUM 7.4 (L) 05/27/2022 1329   CALCIUM 8.9 06/26/2015 0916   PROT 6.6 05/04/2022 1335   PROT 6.6 12/10/2019 1007   PROT 6.2 (L) 06/26/2015 0916   ALBUMIN 3.3 (L) 05/04/2022 1335   ALBUMIN 4.4 12/10/2019 1007   ALBUMIN 3.5 06/26/2015 0916   AST 16 05/04/2022 1335   AST 17 06/26/2015 0916   ALT 11 05/04/2022 1335   ALT 15 06/26/2015 0916   ALKPHOS 103 05/04/2022 1335   ALKPHOS 87 06/26/2015 0916   BILITOT 0.4 05/04/2022 1335   BILITOT <0.2 12/10/2019 1007   BILITOT 0.50 06/26/2015 0916   GFRNONAA 26 (L) 05/27/2022 1329   GFRAA 32 (L) 12/10/2019 1007   Lab Results  Component Value Date   CHOL 159 09/24/2019   HDL 45 09/24/2019   LDLCALC 82 09/24/2019   LDLDIRECT 165.7 12/23/2008   TRIG 159 (H) 09/24/2019   CHOLHDL 3.5 09/24/2019   Lab Results  Component Value Date   HGBA1C 5.9 (H) 10/17/2020   Lab Results  Component Value Date   VITAMINB12 817 12/10/2019   Lab Results  Component  Value Date   TSH 1.495 08/27/2021      ASSESSMENT AND PLAN 89 y.o. year old male  has a past medical history of Asthma, Barrett's esophagus, Benign prostatic hypertrophy, Bilateral lower extremity edema, CHF (congestive heart failure) (HCC), Chronic fatigue, COPD (chronic obstructive pulmonary disease) (HCC), Diabetes mellitus type 2 in nonobese (HCC) (04/24/2019), Diverticulosis of colon (without mention of hemorrhage), Esophageal reflux, Gastritis, Gluten intolerance, Hiatal hernia (02/10/2011), Hyperlipemia, Hypertension, Lymphoma of lymph nodes in pelvis (HCC) (03/03/2011), Melanoma (HCC), Microcytic anemia (04/20/2011), NHL (non-Hodgkin's lymphoma) (HCC), Osteoarthritis, Periaortic lymphadenopathy (02/16/2011), Personal history of colonic polyps (2004), Pulmonary hyperinflation, Renal cyst (02/10/2011), S/P radiation therapy (03/15/11 - 04/09/11), and Sleep apnea. here  with:  1.  Restless leg syndrome  Continue Requip 2 mg 3 times a day.  Okay to take an 0.5-1 tablet for breakthrough symptoms daily if needed.  Advised that he should not take more than 4 doses a day Increase Gabapentin to 100 mg twice day Certainly if symptoms worsen he was advised to let us know He will follow-up in 6 months with Dr. Lucia Gaskins or sooner if needed.    Butch Penny, MSN, NP-C 06/03/2022, 11:10 AM [facility] [address], [address] [place], [place] [address] [phone]

## 2022-06-03 NOTE — Patient Instructions (Signed)
Your Plan:  Take requip 2 mg three times a day. Fourth dose ok but do not take anymore Increase gabapentin 100 mg twice a day If your symptoms worsen or you develop new symptoms please let us know.    Thank you for coming to see [place] at [facility]. I hope we have been able to provide you high quality care today.  You may receive a patient satisfaction survey over the next few weeks. We would appreciate your feedback and comments so that we may continue to improve ourselves and the health of our patients.

## 2022-06-04 NOTE — Progress Notes (Signed)
Carelink Summary Report / Loop Recorder 

## 2022-06-10 ENCOUNTER — Telehealth (HOSPITAL_COMMUNITY): Payer: Self-pay

## 2022-06-10 NOTE — Telephone Encounter (Signed)
Patient called and stated he is waking up with low BP readings. They are 70's over 40's, but then they come up by noon to normal readings. Asymptomatic. He just wanted to know if we need to change anything.

## 2022-06-14 ENCOUNTER — Other Ambulatory Visit: Payer: Self-pay | Admitting: Nurse Practitioner

## 2022-06-14 ENCOUNTER — Ambulatory Visit
Admission: RE | Admit: 2022-06-14 | Discharge: 2022-06-14 | Disposition: A | Discharge status: Home / Self Care | Payer: Medicare HMO | Source: Ambulatory Visit | Attending: Nurse Practitioner | Admitting: Nurse Practitioner

## 2022-06-14 ENCOUNTER — Encounter (HOSPITAL_COMMUNITY): Payer: Self-pay

## 2022-06-14 ENCOUNTER — Ambulatory Visit (HOSPITAL_COMMUNITY)
Admission: RE | Admit: 2022-06-14 | Discharge: 2022-06-14 | Disposition: A | Discharge status: Home / Self Care | Payer: Medicare HMO | Source: Ambulatory Visit | Attending: Internal Medicine | Admitting: Internal Medicine

## 2022-06-14 VITALS — BP 128/72 | HR 84 | Wt 185.4 lb

## 2022-06-14 DIAGNOSIS — Z91199 Patient's noncompliance with other medical treatment and regimen due to unspecified reason: Secondary | ICD-10-CM | POA: Insufficient documentation

## 2022-06-14 DIAGNOSIS — I2721 Secondary pulmonary arterial hypertension: Secondary | ICD-10-CM | POA: Diagnosis not present

## 2022-06-14 DIAGNOSIS — Z8673 Personal history of transient ischemic attack (TIA), and cerebral infarction without residual deficits: Secondary | ICD-10-CM | POA: Diagnosis not present

## 2022-06-14 DIAGNOSIS — G4733 Obstructive sleep apnea (adult) (pediatric): Secondary | ICD-10-CM | POA: Insufficient documentation

## 2022-06-14 DIAGNOSIS — I272 Pulmonary hypertension, unspecified: Secondary | ICD-10-CM | POA: Diagnosis not present

## 2022-06-14 DIAGNOSIS — I480 Paroxysmal atrial fibrillation: Secondary | ICD-10-CM

## 2022-06-14 DIAGNOSIS — M5416 Radiculopathy, lumbar region: Secondary | ICD-10-CM

## 2022-06-14 DIAGNOSIS — I5032 Chronic diastolic (congestive) heart failure: Secondary | ICD-10-CM | POA: Diagnosis present

## 2022-06-14 DIAGNOSIS — I6521 Occlusion and stenosis of right carotid artery: Secondary | ICD-10-CM | POA: Diagnosis not present

## 2022-06-14 DIAGNOSIS — Z7901 Long term (current) use of anticoagulants: Secondary | ICD-10-CM | POA: Insufficient documentation

## 2022-06-14 DIAGNOSIS — N1832 Chronic kidney disease, stage 3b: Secondary | ICD-10-CM | POA: Diagnosis not present

## 2022-06-14 DIAGNOSIS — J849 Interstitial pulmonary disease, unspecified: Secondary | ICD-10-CM

## 2022-06-14 DIAGNOSIS — I130 Hypertensive heart and chronic kidney disease with heart failure and stage 1 through stage 4 chronic kidney disease, or unspecified chronic kidney disease: Secondary | ICD-10-CM | POA: Diagnosis not present

## 2022-06-14 LAB — BASIC METABOLIC PANEL
Anion gap: 14 (ref 5–15)
BUN: 109 mg/dL — ABNORMAL HIGH (ref 8–23)
CO2: 27 mmol/L (ref 22–32)
Calcium: 7.5 mg/dL — ABNORMAL LOW (ref 8.9–10.3)
Chloride: 97 mmol/L — ABNORMAL LOW (ref 98–111)
Creatinine, Ser: 2.28 mg/dL — ABNORMAL HIGH (ref 0.61–1.24)
GFR, Estimated: 27 mL/min — ABNORMAL LOW (ref >60)
Glucose, Bld: 103 mg/dL — ABNORMAL HIGH (ref 70–99)
Potassium: 4.3 mmol/L (ref 3.5–5.1)
Sodium: 138 mmol/L (ref 135–145)

## 2022-06-14 LAB — BRAIN NATRIURETIC PEPTIDE: B Natriuretic Peptide: 100.7 pg/mL — ABNORMAL HIGH (ref 0.0–100.0)

## 2022-06-14 LAB — HEMOGLOBIN A1C
Hgb A1c MFr Bld: 5.9 % — ABNORMAL HIGH (ref 4.8–5.6)
Mean Plasma Glucose: 122.63 mg/dL

## 2022-06-14 NOTE — Telephone Encounter (Signed)
Pt seen in office 5/13

## 2022-06-14 NOTE — Progress Notes (Signed)
ADVANCED HF CLINIC NOTE   PCP: Joycelyn Man, NP Cardiology: Dr. Shirlee Latch  89 y.o. with history of interstitial lung disease (?NSIP vs UIP) on home oxygen, pulmonary hypertension, diastolic CHF, and prior non-Hodgkins lymphoma (pelvic mass, s/p XRT) presents for followup of CHF and pulmonary hypertension.  Patient has history of reportedly mild ILD (NSIP vs UIP) followed by a pulmonologist at [facility] and pulmonary hypertension thought to be out of proportion lung disease followed by a physician at [facility].  Last CT chest was in 11/19, showing stable ILD (?UIP vs NSIP).  He had RHC in 2/20 with moderate PAH, PVR 5.1 WU.  He was tried on Adcirca but developed cognitive problems and worsening of peripheral edema so this was stopped.     Echo 06/20 showed normal LV size and systolic function, RV reportedly normal.  6/20 RHC showed normal filling pressures with moderate pulmonary hypertension, PVR 5.35 WU. After discharge from hospital, patient started on ambrisentan.  His weight steadily began to increase and he developed worsening lower extremity edema, we stopped ambrisentan. He has been able to tolerate Uptravi.  He was admitted in 3/21 with suspected CVA, MRI head showed punctate occipital lobe infarct, thought to be due to small vessel disease. ILR was implanted.  Echo in 3/21 showed EF 60-65%, severe LVH, normal RV.  PYP scan in 3/21 was not suggestive of TTR amyloidosis.  Subsequently, ILR has shown paroxysmal atrial fibrillation.   Echo in 6/22 showed EF 65-70% with mild LVH, normal RV, IVC normal, PASP estimated 29 mmHg.   Patient was admitted in 9/22 with upper GI bleed from bleeding gastric polyps.  He had polypectomy and has been restarted on Eliquis.   He was seen in the ER with atrial fibrillation in 12/22.    Echo in 7/23 showed EF 60-65%, RV normal, PASP estimation was not elevated, IVC normal.   Seen 10/23 for an acute visit when BP 70's and HR 44 at Neurology visit.  Kidney function elevated and metolazone decreased back to once a week. Midodrine added.  Patient admitted 1/24 with RV failure and UTI. Had not been taking his diuretics as prescribed (especially PM doses). He was diuresed with IV lasix and restarted on home torsemide and weekly metolazone. Echo during admit with EF 65-70%, D shaped septum, mildly dilated RV with mild RV dysfunction. RHC showed normal filling pressures and mild pulmonary venous hypertension after diuresis.  Patient was seen in the ER 05/04/22 with dyspnea and increased edema. He was given extra metolazone for several days with good diuresis.   Today he returns for AHF follow up with his wife. Overall feeling tired (baseline). Denies palpitations, CP, dizziness, or PND/Orthopnea. Intermittent SOB. Sleeps in a recliner (has been sleeping in it for 5 years). Appetite fine. No fever or chills. Weight at home 183 pounds. Taking all medications. BP at home 90-110s / 50s-60s. Sometimes he drinks a lot of fluid 2/2 increased thirst. Works with PT twice a week. Tries to go for a walk every day [facility].   6 minute walk (8/21): 122 m 6 minute walk (11/21): 244 m 6 minute walk (3/22): 122 m  Labs (2/24): K 4, SCr 1.61 Labs (4/24): K 2.8, SCr 2.36 Labs (5/24): K 4.1. SCr 1.73  ECG (personally reviewed): NSR, PACs, iRBBB from 4/24  PMH: 1. Non-Hodgkin's lymphoma: Pelvic involvement.  S/p radiation.  Thought to be in remission (Dr. Truett Perna).  2. HTN 3. PACs/PVCs 4. GERD: Barrett's esophagus.  5. 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  - High resolution CT chest 11/19: ILD, ?UIP pattern.  6. Pulmonary hypertension: Thought to be out of proportion to underlying lung disease (ILD, ?NSIP vs UIP).   - RHC (2/20): mean RA 10, PASP 60 with mean 42, mean PCWP 11, CI 3, PVR 5.1 WU.  - Trial of Adcirca => failed due to ?memory worsening/cognitive affects and peripheral edema.  - Echo (6/20): EF 60-65%, mild LVH, RV reportedly  normal, PASP 59 mmHg.  - RHC (6/20): mean RA 7, PA 50/24 mean 34, mean PCWP 12, CI 2.01, PAPI 3.7, PVR 5.35 WU - RF negative, ANA negative, anti-SCL70 negative, anti-centromere negative.  - V/Q scan (8/20): No evidence for chronic PE.  - Echo (3/21): EF 60-65%, severe LVH, normal RV.  - Echo (6/22): EF 65-70% with mild LVH, normal RV, IVC normal, PASP estimated 29 mmHg. - Echo (7/23): EF 60-65%, RV normal, PASP estimation was not elevated, IVC normal.  - Echo (1/24): EF 65-70% with D-shaped septum, mild RV dilation/dysfunction, dilated IVC - RHC (1/24): mean RA 6, PA 45/22 mean 32, mean PCWP 14, CI 3.99, PVR 2.4 WU.  7. CKD: Stage 3.   8. Wandering atrial pacemaker 9. OSA 10. PYP scan (3/21): H/CL ratio 1.0, grade 0.  11. CVA: MRI 3/21 with punctate occipital lobe infarct thought to be due to small vessel disease. - Carotid dopplers (3/21): 40-59% RICA stenosis.  - Carotid dopplers (4/22): 40-59% RICA stenosis.  - Carotid dopplers (4/23): 40-59% RICA stenosis.  12. Atrial fibrillation: Paroxysmal, noted on ILR.  13. GI bleeding 9/22 from gastric polyps.  14. Restless leg syndrome  Social History   Socioeconomic History   Marital status: Married    Spouse name: Not on file   Number of children: 2   Years of education: Not on file   Highest education level: Not on file  Occupational History   Occupation: retired    Comment: [employer]  Tobacco Use   Smoking status: Former    Packs/day: 1.00    Years: 35.00    Additional pack years: 0.00    Total pack years: 35.00    Types: Pipe, Cigarettes    Quit date: 02/23/1992    Years since quitting: 30.3   Smokeless tobacco: Never   Tobacco comments:    quit 20 years ago  Vaping Use   Vaping Use: Never used  Substance and Sexual Activity   Alcohol use: Not Currently    Comment: 2 drinks daily scotch  AND WINE WITH SUPPER   Drug use: No   Sexual activity: Not on file  Other Topics Concern   Not on file  Social  History Narrative   Married - second marriage   He has two children   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   Currently teaches pottery making, 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[facility]   Former Smoker quit 12 years ago- smoked for 35 years   Alcohol use- not currently   Right handed   Social Determinants of Health   Financial Resource Strain: Low Risk  (02/08/2022)   Overall Financial Resource Strain (CARDIA)    Difficulty of Paying Living Expenses: Not hard at all  Food Insecurity: No Food Insecurity (02/08/2022)   Hunger Vital Sign    Worried About Running Out of Food in the Last Year: Never true    Ran Out of Food in the Last Year: Never true  Transportation Needs: No Transportation Needs (02/08/2022)  PRAPARE - Administrator, Civil Service (Medical): No    Lack of Transportation (Non-Medical): No  Physical Activity: Not on file  Stress: Not on file  Social Connections: Not on file  Intimate Partner Violence: Not At Risk (02/08/2022)   Humiliation, Afraid, Rape, and Kick questionnaire    Fear of Current or Ex-Partner: No    Emotionally Abused: No    Physically Abused: No    Sexually Abused: No   Family History  Problem Relation Age of Onset   Dementia Mother    Heart disease Father        MI 12   Urticaria Father    Restless legs syndrome Father    Dementia Sister    Parkinson's disease Sister    ALS Sister    Prostate cancer Brother    Prostate cancer Paternal Uncle    Prostate cancer Paternal Uncle    Prostate cancer Paternal Uncle    Hyperlipidemia Other    Stroke Other    Hypertension Other    ADD / ADHD Other    Colon cancer Neg Hx    Allergic rhinitis Neg Hx    Asthma Neg Hx    Eczema Neg Hx    ROS: All systems reviewed and negative except as per HPI.   Current Outpatient Medications  Medication Sig Dispense Refill   acetaminophen (TYLENOL) 500 MG tablet Take 500 mg by mouth every 6 (six) hours as needed for mild pain or moderate pain.     atorvastatin  (LIPITOR) 40 MG tablet Take 1 tablet (40 mg total) by mouth daily at 6 PM. 30 tablet 1   cyanocobalamin (,VITAMIN B-12,) 1000 MCG/ML injection Inject 1,000 mcg into the muscle every 30 (thirty) days.     ELIQUIS 2.5 MG TABS tablet TAKE ONE TABLET TWICE DAILY 60 tablet 11   escitalopram (LEXAPRO) 10 MG tablet Take 10 mg by mouth in the morning.     finasteride (PROSCAR) 5 MG tablet Take 5 mg by mouth every evening.     FLONASE SENSIMIST 27.5 MCG/SPRAY nasal spray Place 1 spray into the nose daily as needed for rhinitis or allergies.      gabapentin (NEURONTIN) 100 MG capsule Take 1 capsule (100 mg total) by mouth 2 (two) times daily. (Patient taking differently: Patient takes 2 tablets by mouth at bedtime.) 60 capsule 5   guaiFENesin (MUCINEX) 600 MG 12 hr tablet Take 600 mg by mouth in the morning.     Iron, Ferrous Sulfate, 325 (65 Fe) MG TABS Take 325 mg by mouth daily. 30 tablet 4   Loperamide HCl (IMODIUM PO) Take 2 tablets by mouth daily.     metolazone (ZAROXOLYN) 2.5 MG tablet Take 1 tablet (2.5 mg total) by mouth 3 (three) times a week. Every Monday, Wednesday and Friday (Patient taking differently: Take 2.5 mg by mouth 2 (two) times daily. Every Tuesday and Friday) 35 tablet 3   metoprolol succinate (TOPROL-XL) 25 MG 24 hr tablet TAKE 1/2 TABLET AT BEDTIME 15 tablet 3   midodrine (PROAMATINE) 5 MG tablet Take 2 tablets (10 mg total) by mouth 3 (three) times daily with meals. 90 tablet 11   Multiple Vitamins-Minerals (PRESERVISION AREDS 2) CAPS Take 1 capsule by mouth 2 (two) times daily.     omeprazole (PRILOSEC) 20 MG capsule Take 1 capsule (20 mg total) by mouth 2 (two) times daily before a meal. 60 capsule 2   ondansetron (ZOFRAN-ODT) 4 MG disintegrating tablet Take 1 tablet (  4 mg total) by mouth every 8 (eight) hours as needed for nausea or vomiting. 20 tablet 0   oxyCODONE (OXY IR/ROXICODONE) 5 MG immediate release tablet Take 5 mg by mouth 2 (two) times daily as needed.     OXYGEN  Inhale 2 L/min into the lungs. As needed and at night     potassium chloride SA (KLOR-CON M) 20 MEQ tablet Take 4 tablets (80 mEq total) by mouth in the morning AND 2 tablets (40 mEq total) every evening. (Patient taking differently: Take 4 tablets (80 mEq total) by mouth in the morning AND 2 tablets (40 mEq total) every evening. Take a extra tablet on Tuesday and Friday.) 180 tablet 6   Riociguat (ADEMPAS) 2.5 MG TABS Take 2.5 mg by mouth 3 (three) times daily. 90 tablet 11   rOPINIRole (REQUIP) 2 MG tablet TAKE ONE TABLET THREE TIMES DAILY 90 tablet 4   Selexipag (UPTRAVI) 1600 MCG TABS Take 1,600 mcg by mouth 2 (two) times daily. 0800 and 1900     Selexipag 400 MCG TABS Take 400 mcg by mouth 2 (two) times daily. 0800 and 1900     torsemide (DEMADEX) 100 MG tablet TAKE ONE TABLET TWICE DAILY 180 tablet 3   TRAZODONE HCL ER PO Take 1 tablet by mouth at bedtime.     No current facility-administered medications for this encounter.   BP 128/72   Pulse 84   Wt 84.1 kg (185 lb 6.4 oz)   SpO2 94%   BMI 29.92 kg/m   Wt Readings from Last 3 Encounters:  06/14/22 84.1 kg (185 lb 6.4 oz)  06/03/22 83 kg (183 lb)  05/14/22 84 kg (185 lb 3.2 oz)   Physical exam:  General:  chronically appearing.  No respiratory difficulty. Walked in with walker HEENT: normal, + glasses Neck: supple. JVD ~10 cm. Carotids 2+ bilat; no bruits. No lymphadenopathy or thyromegaly appreciated. Cor: PMI nondisplaced. Regular rate & rhythm. No rubs, gallops or murmurs. Lungs: clear Abdomen: soft, nontender, nondistended. No hepatosplenomegaly. No bruits or masses. Good bowel sounds. Extremities: no cyanosis, clubbing, rash, +2 BLE edema  Neuro: alert & oriented x 3, cranial nerves grossly intact. moves all 4 extremities w/o difficulty. Affect pleasant.   Assessment/Plan: 1.Chronic diastolic CHF: Suspect with significant component of RV failure from pulmonary hypertension.  Echo in 6/20 with EF 60-65%, RV not  well-visualized but PASP 59 mmHg.  Echo (3/21) with EF 60-65%, severe LVH, normal RV, PASP 28 mmHg.  PYP scan was not suggestive of TTR cardiac amyloidosis. Echo in 6/22 showed EF 65-70% with mild LVH, normal RV, IVC normal, PASP estimated 29 mmHg.  Admit 01/24 with volume overload in setting of noncompliance with diuretics. Echo 01/24 with EF 65-70% with D-shaped septum, mild RV dilation/dysfunction, dilated IVC. RHC 1/24 showed normal filling pressures with mild pulmonary venous hypertension after diuresis. NYHA class III symptoms, volume slighlty elevated but appears stable.  - Continue torsemide 100 mg bid.   - Continue metolazone twice a week Tue/Fri.  BMET/BNP today.  - Continue KCl 80 qam/40 qpm with extra 40 KCl on metolazone days.  - On midodrine 10 TID - update A1c 2. CKD Stage 3b: BMET today. 3. Pulmonary hypertension: Patient thought to have PAH out of proportion to his lung disease (ILD, NSIP versus UIP). RHC in 2/20 showed mean PA pressure 41, PVR 5.1 WU, CI 3. Echo in 6/20 with RV reportedly ok => I reviewed and do not think that RV was well-visualized.  Patient  did not tolerate Adcirca due to edema/?cognitive effects.  RHC was done again in 6/20, showed moderate pulmonary arterial hypertension with PVR 5.35 (comparable to prior).  Serological workup for PH was negative. Patient started ambrisentan 5 mg and developed significant edema/volume overload despite a high dose of torsemide. He is now on Uptravi + riociguat.  RHC in 1/24 after diuresis showed mild pulmonary venous hypertension with PVR 2.4 WU.  - He will stay off ambrisentan (? Prior trigger for volume overload).  - As above, he did not tolerate tadalafil.  - He is at 2000 mcg bid Uptravi.   - Continue riociguat at goal dose 2.5 mg tid.  4. Atrial fibrillation: Paroxysmal, noted on ILR. Remains in NSR.  - Continue apixaban 2.5 mg bid (dosed for age, renal dysfunction).  5. OSA: Cannot tolerate CPAP.  6. CVA: In 3/21, may have  been atrial fibrillation-related.  On apixaban now.  7. Carotid stenosis: Carotid duplex 04/23: 40-59% right ICA stenosis, no significant stenosis on the left - Continue statin. - Carotid doppler 4/24: 40-59% RICA stenosis, repeat study 1 year.  8. Interstitial lung disease:  Has seen pulmonary, ILD seems mild and stable.   Follow up 4-6 weeks with Dr. Thressa Sheller AGACNP-BC  06/14/2022

## 2022-06-14 NOTE — Patient Instructions (Addendum)
Thank you for coming in today  If you had labs drawn today, any labs that are abnormal the clinic will call you No news is good news  Medications: No changes    Follow up appointments:  Your physician recommends that you schedule a follow-up appointment in:  6 weeks with Dr. Shirlee Latch    Do the following things EVERYDAY: Weigh yourself in the morning before breakfast. Write it down and keep it in a log. Take your medicines as prescribed Eat low salt foods--Limit salt (sodium) to 2000 mg per day.  Stay as active as you can everyday Limit all fluids for the day to less than 2 liters   At the [facility], you and your health needs are our priority. As part of our continuing mission to provide you with exceptional heart care, we have created designated Provider Care Teams. These Care Teams include your primary Cardiologist (physician) and Advanced Practice Providers (APPs- Physician Assistants and Nurse Practitioners) who all work together to provide you with the care you need, when you need it.   You may see any of the following providers on your designated Care Team at your next follow up: Dr Arvilla Meres Dr Marca Ancona Dr. Marcos Eke, NP Robbie Lis, [place] [facility] [place], [place] Brynda Peon, NP Karle Plumber, PharmD   Please be sure to bring in all your medications bottles to every appointment.    Thank you for choosing [facility]  If you have any questions or concerns before your next appointment please send [place] a message through [place] or call our office at [phone].    TO LEAVE A MESSAGE FOR THE NURSE SELECT OPTION 2, PLEASE LEAVE A MESSAGE INCLUDING: YOUR NAME DATE OF BIRTH CALL BACK NUMBER REASON FOR CALL**this is important as we prioritize the call backs  YOU WILL RECEIVE A CALL BACK THE SAME DAY AS LONG AS YOU CALL BEFORE 4:00 PM

## 2022-06-23 ENCOUNTER — Telehealth (HOSPITAL_COMMUNITY): Payer: Self-pay

## 2022-06-23 NOTE — Telephone Encounter (Signed)
Bento called and states his BP is low in the mornings - 70's/40's range. This morning was actually 90/46 with nurse present. He also states his O2 sats are in the 80's on 3L of oxygen. He has trouble getting around because of shortness of  breath. Please advise

## 2022-06-23 NOTE — Telephone Encounter (Signed)
I spoke to nurse at [facility] and gave her instructions for medication change and to make sure he is wearing compression hose.  Left message for [name] to call back. Updated his wife also.

## 2022-06-24 ENCOUNTER — Other Ambulatory Visit (HOSPITAL_COMMUNITY): Payer: Self-pay | Admitting: Cardiology

## 2022-06-24 MED ORDER — METOLAZONE 2.5 MG PO TABS: 2.5000 mg | ORAL_TABLET | ORAL | 3 refills | Status: DC

## 2022-06-29 ENCOUNTER — Telehealth (HOSPITAL_COMMUNITY): Payer: Self-pay

## 2022-06-29 NOTE — Telephone Encounter (Signed)
Informed patient's wife of instructions and left message for nurse manager at [facility].

## 2022-06-29 NOTE — Telephone Encounter (Signed)
Patient's wife called and stated he is continuing to gain weight. He has gained 5 or 6 pounds in a week. There is swelling in his legs but no shortness of breath. Please advise on any changes.

## 2022-06-30 ENCOUNTER — Ambulatory Visit (INDEPENDENT_AMBULATORY_CARE_PROVIDER_SITE_OTHER): Payer: Medicare HMO

## 2022-06-30 ENCOUNTER — Other Ambulatory Visit (HOSPITAL_COMMUNITY): Payer: Self-pay

## 2022-06-30 DIAGNOSIS — I639 Cerebral infarction, unspecified: Secondary | ICD-10-CM

## 2022-06-30 MED ORDER — METOLAZONE 2.5 MG PO TABS: 2.5000 mg | ORAL_TABLET | ORAL | 3 refills | Status: DC

## 2022-07-01 ENCOUNTER — Telehealth (HOSPITAL_COMMUNITY): Payer: Self-pay

## 2022-07-01 LAB — CUP PACEART REMOTE DEVICE CHECK
Date Time Interrogation Session: 20240529230843
Implantable Pulse Generator Implant Date: 20210325

## 2022-07-01 NOTE — Progress Notes (Signed)
Carelink Summary Report / Loop Recorder 

## 2022-07-01 NOTE — Telephone Encounter (Signed)
Earlene Plater, the physical therapist for Ryan [name] called and stated he would like to see if we can order a portable O2 concentrator for him. He does not have a PCP at the moment. He is using oxygen all the time and because of vision issues is having trouble managing a tank. Can we order for him?

## 2022-07-02 NOTE — Telephone Encounter (Signed)
Yes, that would be fine.

## 2022-07-02 NOTE — Telephone Encounter (Signed)
Left message for Ryan [name] at [employer]

## 2022-07-06 ENCOUNTER — Other Ambulatory Visit (HOSPITAL_COMMUNITY): Payer: Self-pay | Admitting: *Deleted

## 2022-07-06 MED ORDER — METOLAZONE 2.5 MG PO TABS: 2.5000 mg | ORAL_TABLET | ORAL | 3 refills | Status: DC

## 2022-07-19 ENCOUNTER — Telehealth (HOSPITAL_COMMUNITY): Payer: Self-pay | Admitting: Cardiology

## 2022-07-19 NOTE — Telephone Encounter (Signed)
Pt aware  Attempted to verbally update facility Detailed message left for clinical line Orders faxed to [phone]  Provider aware unable to order furoscix in the future ?outpatient infusion

## 2022-07-19 NOTE — Telephone Encounter (Signed)
Patient called to report increase in home oxygen usage Reports O2 was increased to 4L Sats improved to 90%on RA Weight increased x 6 lbs (current weight 190 Lbs)  With increased SOB and o2 usage-pt wanted to see if a dose of iv lasix is needed Very uncomfortable with increased SOB  Please advise

## 2022-07-22 ENCOUNTER — Other Ambulatory Visit (HOSPITAL_COMMUNITY): Payer: Self-pay | Admitting: Pharmacist

## 2022-07-22 MED ORDER — ADEMPAS 2.5 MG PO TABS: 2.5000 mg | ORAL_TABLET | Freq: Three times a day (TID) | ORAL | 11 refills | Status: DC

## 2022-07-22 NOTE — Progress Notes (Signed)
Carelink Summary Report / Loop Recorder 

## 2022-07-29 ENCOUNTER — Other Ambulatory Visit (HOSPITAL_COMMUNITY): Payer: Self-pay | Admitting: Cardiology

## 2022-07-29 ENCOUNTER — Telehealth (HOSPITAL_COMMUNITY): Payer: Self-pay | Admitting: Cardiology

## 2022-07-29 NOTE — Telephone Encounter (Signed)
Pharmacy called with refill request/clarification  Facility has been using metolazone 5 mg tabs Pharmacy has filled 2.5 mg tabs  -chart reviewed and only script/order for 2.5mg  is noted however will forward to provider for review

## 2022-08-02 ENCOUNTER — Ambulatory Visit: Payer: Medicare HMO

## 2022-08-02 DIAGNOSIS — I639 Cerebral infarction, unspecified: Secondary | ICD-10-CM | POA: Diagnosis not present

## 2022-08-02 MED ORDER — MIDODRINE HCL 5 MG PO TABS: 10.0000 mg | ORAL_TABLET | Freq: Three times a day (TID) | ORAL | 11 refills | Status: DC

## 2022-08-02 MED ORDER — METOLAZONE 2.5 MG PO TABS: 2.5000 mg | ORAL_TABLET | ORAL | 3 refills | Status: DC

## 2022-08-02 NOTE — Telephone Encounter (Signed)
Pharmacy aware

## 2022-08-02 NOTE — Telephone Encounter (Signed)
Resent Metolazone clarification is needed

## 2022-08-03 LAB — CUP PACEART REMOTE DEVICE CHECK
Date Time Interrogation Session: 20240701230254
Implantable Pulse Generator Implant Date: 20210325

## 2022-08-12 ENCOUNTER — Encounter (HOSPITAL_COMMUNITY): Payer: Medicare HMO | Admitting: Cardiology

## 2022-08-13 ENCOUNTER — Emergency Department (HOSPITAL_COMMUNITY)
Admission: EM | Admit: 2022-08-13 | Discharge: 2022-08-13 | Disposition: A | Discharge status: Skilled Nursing Facility | Payer: Medicare HMO | Attending: Emergency Medicine | Admitting: Emergency Medicine

## 2022-08-13 ENCOUNTER — Encounter (HOSPITAL_COMMUNITY): Payer: Self-pay

## 2022-08-13 ENCOUNTER — Other Ambulatory Visit: Payer: Self-pay

## 2022-08-13 DIAGNOSIS — J449 Chronic obstructive pulmonary disease, unspecified: Secondary | ICD-10-CM | POA: Diagnosis not present

## 2022-08-13 DIAGNOSIS — F4323 Adjustment disorder with mixed anxiety and depressed mood: Secondary | ICD-10-CM | POA: Diagnosis present

## 2022-08-13 DIAGNOSIS — E119 Type 2 diabetes mellitus without complications: Secondary | ICD-10-CM | POA: Insufficient documentation

## 2022-08-13 DIAGNOSIS — R600 Localized edema: Secondary | ICD-10-CM | POA: Insufficient documentation

## 2022-08-13 DIAGNOSIS — J45909 Unspecified asthma, uncomplicated: Secondary | ICD-10-CM | POA: Insufficient documentation

## 2022-08-13 DIAGNOSIS — I110 Hypertensive heart disease with heart failure: Secondary | ICD-10-CM | POA: Insufficient documentation

## 2022-08-13 DIAGNOSIS — Z87891 Personal history of nicotine dependence: Secondary | ICD-10-CM | POA: Insufficient documentation

## 2022-08-13 DIAGNOSIS — Z7901 Long term (current) use of anticoagulants: Secondary | ICD-10-CM | POA: Insufficient documentation

## 2022-08-13 DIAGNOSIS — Z79899 Other long term (current) drug therapy: Secondary | ICD-10-CM | POA: Diagnosis not present

## 2022-08-13 DIAGNOSIS — I959 Hypotension, unspecified: Secondary | ICD-10-CM | POA: Diagnosis not present

## 2022-08-13 DIAGNOSIS — R45851 Suicidal ideations: Secondary | ICD-10-CM | POA: Diagnosis not present

## 2022-08-13 DIAGNOSIS — I509 Heart failure, unspecified: Secondary | ICD-10-CM | POA: Insufficient documentation

## 2022-08-13 LAB — CBC
HCT: 29.9 % — ABNORMAL LOW (ref 39.0–52.0)
Hemoglobin: 9.3 g/dL — ABNORMAL LOW (ref 13.0–17.0)
MCH: 24.8 pg — ABNORMAL LOW (ref 26.0–34.0)
MCHC: 31.1 g/dL (ref 30.0–36.0)
MCV: 79.7 fL — ABNORMAL LOW (ref 80.0–100.0)
Platelets: 297 10*3/uL (ref 150–400)
RBC: 3.75 MIL/uL — ABNORMAL LOW (ref 4.22–5.81)
RDW: 16.4 % — ABNORMAL HIGH (ref 11.5–15.5)
WBC: 9.1 10*3/uL (ref 4.0–10.5)
nRBC: 0 % (ref 0.0–0.2)

## 2022-08-13 LAB — URINALYSIS, ROUTINE W REFLEX MICROSCOPIC
Bilirubin Urine: NEGATIVE
Glucose, UA: NEGATIVE mg/dL
Hgb urine dipstick: NEGATIVE
Ketones, ur: NEGATIVE mg/dL
Leukocytes,Ua: NEGATIVE
Nitrite: NEGATIVE
Protein, ur: NEGATIVE mg/dL
Specific Gravity, Urine: 1.008 (ref 1.005–1.030)
pH: 5 (ref 5.0–8.0)

## 2022-08-13 LAB — BASIC METABOLIC PANEL
Anion gap: 15 (ref 5–15)
BUN: 157 mg/dL — ABNORMAL HIGH (ref 8–23)
CO2: 27 mmol/L (ref 22–32)
Calcium: 6.6 mg/dL — ABNORMAL LOW (ref 8.9–10.3)
Chloride: 88 mmol/L — ABNORMAL LOW (ref 98–111)
Creatinine, Ser: 2.39 mg/dL — ABNORMAL HIGH (ref 0.61–1.24)
GFR, Estimated: 25 mL/min — ABNORMAL LOW (ref >60)
Glucose, Bld: 101 mg/dL — ABNORMAL HIGH (ref 70–99)
Potassium: 3.6 mmol/L (ref 3.5–5.1)
Sodium: 130 mmol/L — ABNORMAL LOW (ref 135–145)

## 2022-08-13 MED ORDER — SODIUM CHLORIDE 0.9 % IV BOLUS
250.0000 mL | Freq: Once | INTRAVENOUS | Status: AC
  Administered 2022-08-13: 250 mL via INTRAVENOUS

## 2022-08-13 NOTE — ED Provider Notes (Signed)
[place] [facility] AT [facility] Provider Note   CSN: [id number] Arrival date & time: 08/13/22  1136     History  Chief Complaint  Patient presents with   Hypotension   Suicidal    Ryan [name] is a 89 y.o. male with medical history of type 2 diabetes, asthma, Barrett's esophagus, BPH, bilateral lower extremity swelling, CHF, chronic fatigue, COPD on 2 L of oxygen as needed, GERD, hypertension, pulmonary hyperinflation.  Patient presents to the ED for evaluation of hypotension as well as suicidal ideation.  The patient reports that his skilled nursing facility took his blood pressure this morning and noted it to be decreased.  Per triage note, patient blood pressure as reported by facility was 78/40.  On EMS arrival, the patient vital signs were 92/58.  The patient is a home oxygen user and uses it as needed.  The patient denies any medical complaints at this time.  He denies lightheadedness, dizziness, weakness, chest pain, shortness of breath, nausea, vomiting, diarrhea, fevers, abdominal pain, sore throat, body aches or chills.  He is endorsing lower extremity swelling but states this is chronic in nature.  HPI     Home Medications Prior to Admission medications   Medication Sig Start Date End Date Taking? Authorizing Provider  acetaminophen (TYLENOL) 500 MG tablet Take 500 mg by mouth every 6 (six) hours as needed for mild pain or moderate pain.    [provider]  atorvastatin (LIPITOR) 40 MG tablet Take 1 tablet (40 mg total) by mouth daily at 6 PM. 04/27/19   Kathlen Mody, MD  cyanocobalamin (,VITAMIN B-12,) 1000 MCG/ML injection Inject 1,000 mcg into the muscle every 30 (thirty) days.    [provider]  ELIQUIS 2.5 MG TABS tablet TAKE ONE TABLET TWICE DAILY 07/29/22   Laurey Morale, MD  escitalopram (LEXAPRO) 10 MG tablet Take 10 mg by mouth in the morning.    [provider]  finasteride (PROSCAR) 5 MG tablet Take 5 mg by  mouth every evening.    [provider]  FLONASE SENSIMIST 27.5 MCG/SPRAY nasal spray Place 1 spray into the nose daily as needed for rhinitis or allergies.  12/11/18   [provider]  gabapentin (NEURONTIN) 100 MG capsule Take 1 capsule (100 mg total) by mouth 2 (two) times daily. Patient taking differently: Patient takes 2 tablets by mouth at bedtime. 06/03/22   Butch Penny, NP  guaiFENesin (MUCINEX) 600 MG 12 hr tablet Take 600 mg by mouth in the morning.    [provider]  Iron, Ferrous Sulfate, 325 (65 Fe) MG TABS Take 325 mg by mouth daily. 12/31/20   Milford, Anderson Malta, FNP  Loperamide HCl (IMODIUM PO) Take 2 tablets by mouth daily.    [provider]  metolazone (ZAROXOLYN) 2.5 MG tablet Take 1 tablet (2.5 mg total) by mouth 2 (two) times a week. 08/02/22   Alen Bleacher, NP  metoprolol succinate (TOPROL-XL) 25 MG 24 hr tablet TAKE 1/2 TABLET AT BEDTIME 05/19/22   Laurey Morale, MD  midodrine (PROAMATINE) 5 MG tablet Take 2 tablets (10 mg total) by mouth 3 (three) times daily with meals. 08/02/22   Alen Bleacher, NP  Multiple Vitamins-Minerals (PRESERVISION AREDS 2) CAPS Take 1 capsule by mouth 2 (two) times daily.    [provider]  omeprazole (PRILOSEC) 20 MG capsule Take 1 capsule (20 mg total) by mouth 2 (two) times daily before a meal. 09/16/21   Leonides Schanz,  Orlie Dakin, MD  ondansetron (ZOFRAN-ODT) 4 MG disintegrating tablet Take 1 tablet (4 mg total) by mouth every 8 (eight) hours as needed for nausea or vomiting. 02/08/22   Ghimire, Werner Lean, MD  oxyCODONE (OXY IR/ROXICODONE) 5 MG immediate release tablet Take 5 mg by mouth 2 (two) times daily as needed. 06/14/22   [provider]  OXYGEN Inhale 2 L/min into the lungs. As needed and at night    [provider]  potassium chloride SA (KLOR-CON M) 20 MEQ tablet Take 4 tablets (80 mEq total) by mouth in the morning AND 2 tablets (40 mEq total) every evening. Patient taking differently:  Take 4 tablets (80 mEq total) by mouth in the morning AND 2 tablets (40 mEq total) every evening. Take a extra tablet on Tuesday and Friday. 03/04/22   Laurey Morale, MD  Riociguat (ADEMPAS) 2.5 MG TABS Take 2.5 mg by mouth 3 (three) times daily. 07/22/22   Laurey Morale, MD  rOPINIRole (REQUIP) 2 MG tablet TAKE ONE TABLET THREE TIMES DAILY 05/10/22   Butch Penny, NP  Selexipag (UPTRAVI) 1600 MCG TABS Take 1,600 mcg by mouth 2 (two) times daily. 0800 and 1900    [provider]  Selexipag 400 MCG TABS Take 400 mcg by mouth 2 (two) times daily. 0800 and 1900    [provider]  torsemide (DEMADEX) 100 MG tablet TAKE ONE TABLET TWICE DAILY 09/24/21   Jacklynn Ganong, FNP  TRAZODONE HCL ER PO Take 1 tablet by mouth at bedtime.    [provider]      Allergies    Pollen extract-tree extract, Feraheme [ferumoxytol], and Molds & smuts    Review of Systems   Review of Systems  Constitutional:  Negative for fever.  Respiratory:  Negative for shortness of breath.   Cardiovascular:  Positive for leg swelling. Negative for chest pain.  Gastrointestinal:  Negative for nausea and vomiting.  Neurological:  Negative for dizziness, weakness and light-headedness.  Psychiatric/Behavioral:  Positive for suicidal ideas.   All other systems reviewed and are negative.   Physical Exam Updated Vital Signs BP 106/63   Pulse 76   Temp 98.6 F (37 C) (Oral)   Resp 10   Ht 5\' 3"  (1.6 m)   Wt 85.3 kg   SpO2 99%   BMI 33.30 kg/m  Physical Exam Vitals and nursing note reviewed.  Constitutional:      General: He is not in acute distress.    Appearance: Normal appearance. He is not ill-appearing, toxic-appearing or diaphoretic.  HENT:     Head: Normocephalic and atraumatic.     Nose: Nose normal.     Mouth/Throat:     Mouth: Mucous membranes are moist.     Pharynx: Oropharynx is clear.  Eyes:     Extraocular Movements: Extraocular movements intact.      Conjunctiva/sclera: Conjunctivae normal.     Pupils: Pupils are equal, round, and reactive to light.  Cardiovascular:     Rate and Rhythm: Normal rate and regular rhythm.  Pulmonary:     Effort: Pulmonary effort is normal.     Breath sounds: Normal breath sounds. No wheezing.  Abdominal:     General: Abdomen is flat. Bowel sounds are normal.     Palpations: Abdomen is soft.     Tenderness: There is no abdominal tenderness.  Musculoskeletal:     Cervical back: Normal range of motion and neck supple. No tenderness.     Right lower  leg: Edema present.     Left lower leg: Edema present.  Skin:    General: Skin is warm and dry.     Capillary Refill: Capillary refill takes less than 2 seconds.  Neurological:     Mental Status: He is alert and oriented to person, place, and time.  Psychiatric:        Thought Content: Thought content includes suicidal ideation.     ED Results / Procedures / Treatments   Labs (all labs ordered are listed, but only abnormal results are displayed) Labs Reviewed  CBC - Abnormal; Notable for the following components:      Result Value   RBC 3.75 (*)    Hemoglobin 9.3 (*)    HCT 29.9 (*)    MCV 79.7 (*)    MCH 24.8 (*)    RDW 16.4 (*)    All other components within normal limits  BASIC METABOLIC PANEL - Abnormal; Notable for the following components:   Sodium 130 (*)    Chloride 88 (*)    Glucose, Bld 101 (*)    BUN 157 (*)    Creatinine, Ser 2.39 (*)    Calcium 6.6 (*)    GFR, Estimated 25 (*)    All other components within normal limits  URINALYSIS, ROUTINE W REFLEX MICROSCOPIC - Abnormal; Notable for the following components:   Color, Urine STRAW (*)    All other components within normal limits    EKG None  Radiology No results found.  Procedures Procedures   Medications Ordered in ED Medications  sodium chloride 0.9 % bolus 250 mL (0 mLs Intravenous Stopped 08/13/22 1458)    ED Course/ Medical Decision Making/ A&P Clinical  Course as of 08/13/22 1506  Fri Aug 13, 2022  1458 TTS Consult, awaiting dispo [BB]    Clinical Course User Index [BB] Fayrene Helper, MD    Medical Decision Making Amount and/or Complexity of Data Reviewed Labs: ordered.   89 year old male presents to ED for evaluation of hypertension, suicidal ideation.  Please see HPI for further details.  On examination the patient is afebrile and nontachycardic.  His lung sounds are clear bilaterally and he is not hypoxic.  His abdomen is soft and compressible throughout.  Neurological examination at baseline.  Overall nontoxic in appearance.  Patient denies any symptoms or medical complaints.  Patient CBC with no leukocytosis, baseline hemoglobin 9.3.  Patient metabolic panel shows sodium 130, creatinine 2.39 which is baseline, BUN 157, GFR 25.  Chart reviewed.  The patient has had persistently low blood pressure at office visits and ED visits dating back to the beginning of the year in January.  The patient denies lightheadedness, dizziness, weakness.  He reports that he advised his facility that his blood pressure is often low but they "ignored" him and sent him to the ED anyway.  Patient also expressing passive suicidal ideation.  He states that he "just wishes I could flip a switch and end it all" but he denies any plan to harm himself, denies any HI, AVH. Denies access to firearms. We will have TTS consult on this patient.  He has medically cleared pending TTS disposition.  At the end of my shift, TTS had not yet consulted on the patient.  Patient signed out to Dr. Hermelinda Dellen, resident, pending TTS disposition and consult.   Final Clinical Impression(s) / ED Diagnoses Final diagnoses:  Suicidal ideation  Hypotension, unspecified hypotension type    Rx / DC Orders ED Discharge Orders  None         Clent Ridges 08/13/22 1506    Maia Plan, MD 08/18/22 1140

## 2022-08-13 NOTE — ED Notes (Signed)
Diffuse swelling noted to pt's lower extremities, per pt it is chronic. Pt denies any complaints at this time. Pt cleared by psychiatry
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
## 2022-08-13 NOTE — ED Triage Notes (Signed)
Pt to ED via [facility] from [facility] , facility was concerned that pt was hypotensive,Reported to MES VS 78/40 at facility. On EMS arrival pt VS 92./58 , HR 79, 96%4l cbg 139. Pt home 02 user, Pt voicing no complaints. A&OX4. No medications given.

## 2022-08-13 NOTE — Discharge Instructions (Signed)
You are seen today for low blood pressure, and suicidal ideation.  While you were here we performed a physical exam, check labs, got imaging, and monitors her vital signs.  Medically these were all reassuring.  Additionally, you talk to our psych team who felt that you are safe for discharge.  Please plan to follow-up with your primary care doctor next week.  Additionally, if you have any new or worsening concerns she can go back to the emergency department.  Specifically if you have any dizziness, lightheadedness, weakness, or any other symptoms we be happy to reevaluate you.

## 2022-08-13 NOTE — ED Triage Notes (Signed)
During triage assessment pt reports feeling suicidal for weeks, without plan. reports he is "tired of being old and sick"  Pt verbally contracts for safety.

## 2022-08-13 NOTE — Consult Note (Signed)
[facility] ED ASSESSMENT   Reason for Consult:  Patient here expressing passive SI. States he "just wishes he could flip a switch and have it end" Denies any specific plan or intent. Denies history of SI or attempts. Referring Physician:  Delice Bison Patient Identification: Ryan [name] MRN:  [id number] ED Chief Complaint: Adjustment disorder with mixed anxiety and depressed mood  Diagnosis:  Principal Problem:   Adjustment disorder with mixed anxiety and depressed mood   ED Assessment Time Calculation: Start Time: 1430 Stop Time: 1522 Total Time in Minutes (Assessment Completion): 52  HPI:  Ryan [name] is a 89 y.o. male with medical history of type 2 diabetes, asthma, Barrett's esophagus, BPH, bilateral lower extremity swelling, CHF, chronic fatigue, COPD on 2 L of oxygen as needed, GERD, hypertension, pulmonary hyperinflation.   Subjective:   Ryan [name] is a 89 y.o. male patient brought in by EMS due to low BP at nursing facility.  While in the emergency department, patient stated he "just wishes he could flip a switch and have it end." When asked if he has a plan to harm himself, he said "no, of course not."  Patient denies HI, AH and VH.    Patient is hard of hearing and visually impaired. When asked about his support system, patient says he is married but separated from his wife.  He says they are "not on good terms" and "she is tired of taking care of me."  He says this very matter of fact.  He states his wife was recently diagnosed with breast cancer and will begin radiation treatments soon. He also has two adult children that live in [place] that he is "on good terms with."  He also has a step daughter locally that he sees often and is close to.          Patient is a retired Programmer, multimedia and a retired Lexicographer. He says he is bored. There is not enough mental stimulation.  He is feeling more isolated and less social due to the changes in his vision. He  says there are many activities at the facility, they just are not things that he is interested in.  His vision and hearing issues are a barrier for him. Patient says his interactions with his children help. Patient denies any previous psychiatric history.    Although patient endorses passive suicidal ideation, he has no intent or plan to kill himself. He lives in a facility with support staff and he has good social support. Patient is not a risk to himself and is psychiatrically cleared.    Past Psychiatric History: Denies  Risk to Self or Others: Is the patient at risk to self? No Has the patient been a risk to self in the past 6 months? No Has the patient been a risk to self within the distant past? No Is the patient a risk to others? No Has the patient been a risk to others in the past 6 months? No Has the patient been a risk to others within the distant past? No  [place] Scale:  Flowsheet Row ED from 08/13/2022 in [facility] Emergency Department at [facility] ED from 05/04/2022 in [facility] Emergency Department at [facility] ED to Hosp-Admission (Discharged) from 02/02/2022 in [facility] 3E HF PCU  C-SSRS RISK CATEGORY Moderate Risk No Risk No Risk       Past Medical History:  Past Medical History:  Diagnosis Date  Asthma    Barrett's esophagus    Benign prostatic hypertrophy    Bilateral lower extremity edema    CHF (congestive heart failure) (HCC)    Chronic fatigue    COPD (chronic obstructive pulmonary disease) (HCC)    Diabetes mellitus type 2 in nonobese (HCC) 04/24/2019   Diverticulosis of colon (without mention of hemorrhage)    Esophageal reflux    Gastritis    Gluten intolerance    Hiatal hernia 02/10/2011   Noted on CT Scan - Moderate Hiatal Hernia   Hyperlipemia    Hypertension    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 Loreta Ave   Pulmonary hyperinflation    Renal cyst 02/10/2011    Noted on CT Scan - Bilateral Renal Cysts   S/P radiation therapy 03/15/11 - 04/09/11   Abdominal/ Pelvic Tumor, 3600 cGy/20 Fractions   Sleep apnea     Past Surgical History:  Procedure Laterality Date   ARTHROSCOPIC REPAIR ACL     right   BONE MARROW ASPIRATION  02/25/11   Bone Marrow, Aspirate, Clot, and Bilateral Bx, Right PIC   CATARACT EXTRACTION, BILATERAL     ESOPHAGOGASTRODUODENOSCOPY (EGD) WITH PROPOFOL N/A 10/19/2020   Procedure: ESOPHAGOGASTRODUODENOSCOPY (EGD) WITH PROPOFOL;  Surgeon: Imogene Burn, MD;  Location: [facility] ENDOSCOPY;  Service: Gastroenterology;  Laterality: N/A;   EYE SURGERY     HEMOSTASIS CLIP PLACEMENT  10/19/2020   Procedure: HEMOSTASIS CLIP PLACEMENT;  Surgeon: Imogene Burn, MD;  Location: [facility] ENDOSCOPY;  Service: Gastroenterology;;   HERNIA REPAIR     LEFT INGUINAL    INSERTION OF MESH N/A 08/23/2012   Procedure: INSERTION OF MESH;  Surgeon: Lodema Pilot, DO;  Location: [facility] ORS;  Service: General;  Laterality: N/A;   LOOP RECORDER INSERTION N/A 04/26/2019   Procedure: LOOP RECORDER INSERTION;  Surgeon: Marinus Maw, MD;  Location: [facility] INVASIVE CV LAB;  Service: Cardiovascular;  Laterality: N/A;   LUMBAR LAMINECTOMY/DECOMPRESSION MICRODISCECTOMY Right 12/31/2013   Procedure: RIGHT L4-5 L5-S1 LAMINECTOMY;  Surgeon: Barnett Abu, MD;  Location: [facility] NEURO ORS;  Service: Neurosurgery;  Laterality: Right;  RIGHT L4-5 L5-S1 LAMINECTOMY   POLYPECTOMY  10/19/2020   Procedure: POLYPECTOMY;  Surgeon: Imogene Burn, MD;  Location: [facility] ENDOSCOPY;  Service: Gastroenterology;;   RIGHT HEART CATH N/A 07/25/2018   Procedure: RIGHT HEART CATH;  Surgeon: Laurey Morale, MD;   Location: [facility] INVASIVE CV LAB;  Service: Cardiovascular;  Laterality: N/A;   RIGHT HEART CATH N/A 02/05/2022   Procedure: RIGHT HEART CATH;  Surgeon: Laurey Morale, MD;  Location: [facility] INVASIVE CV LAB;  Service: Cardiovascular;  Laterality: N/A;   ROTATOR CUFF REPAIR     right   TONSILLECTOMY     TONSILLECTOMY     VENTRAL HERNIA REPAIR N/A 08/23/2012   Procedure: HERNIA REPAIR VENTRAL ADULT;  Surgeon: Lodema Pilot, DO;  Location: [facility] ORS;  Service: General;  Laterality: N/A;   Family History:  Family History  Problem Relation Age of Onset   Dementia Mother    Heart disease Father        MI 17   Urticaria Father  Restless legs syndrome Father    Dementia Sister    Parkinson's disease Sister    ALS Sister    Prostate cancer Brother    Prostate cancer Paternal Uncle    Prostate cancer Paternal Uncle    Prostate cancer Paternal Uncle    Hyperlipidemia Other    Stroke Other    Hypertension Other    ADD / ADHD Other    Colon cancer Neg Hx    Allergic rhinitis Neg Hx    Asthma Neg Hx    Eczema Neg Hx    Family Psychiatric  History: None  Social History:  Social History   Substance and Sexual Activity  Alcohol Use Not Currently   Comment: 2 drinks daily scotch  AND WINE WITH SUPPER     Social History   Substance and Sexual Activity  Drug Use No    Social History   Socioeconomic History   Marital status: Married    Spouse name: Not on file   Number of children: 2   Years of education: Not on file   Highest education level: Not on file  Occupational History   Occupation: retired    Comment: [employer]  Tobacco Use   Smoking status: Former    Current packs/day: 0.00    Average packs/day: 1 pack/day for 35.0 years (35.0 ttl pk-yrs)    Types: Pipe, Cigarettes    Start date: 02/22/1957    Quit date: 02/23/1992    Years since quitting: 30.4   Smokeless tobacco: Never   Tobacco comments:    quit 20 years ago  Vaping Use   Vaping status: Never Used   Substance and Sexual Activity   Alcohol use: Not Currently    Comment: 2 drinks daily scotch  AND WINE WITH SUPPER   Drug use: No   Sexual activity: Not on file  Other Topics Concern   Not on file  Social History Narrative   Married - second marriage   He has two children   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   Currently teaches pottery making, 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[facility]   Former Smoker quit 12 years ago- smoked for 35 years   Alcohol use- not currently   Right handed   Social Determinants of Health   Financial Resource Strain: Low Risk  (02/08/2022)   Overall Financial Resource Strain (CARDIA)    Difficulty of Paying Living Expenses: Not hard at all  Food Insecurity: No Food Insecurity (02/08/2022)   Hunger Vital Sign    Worried About Running Out of Food in the Last Year: Never true    Ran Out of Food in the Last Year: Never true  Transportation Needs: No Transportation Needs (02/08/2022)   PRAPARE - Administrator, Civil Service (Medical): No    Lack of Transportation (Non-Medical): No  Physical Activity: Not on file  Stress: Not on file  Social Connections: Unknown (06/09/2021)   Received from [facility]   Social Network    Social Network: Not on file   Additional Social History: Patient is married; he does not live with his wife.  He has 2 adult children in [place] and an adult step daughter locally.      Allergies:   Allergies  Allergen Reactions   Pollen Extract-Tree Extract Other (See Comments)    "HEADACHES, TIRED, DRAINAGE FROM SINUSES"   Feraheme [Ferumoxytol] Other (See Comments)    Chest pain, facial flushing, shortness of breath   Molds &  Smuts Other (See Comments)    Also dust mites causes sinus infections, h/a etc.    Labs:  Results for orders placed or performed during the hospital encounter of 08/13/22 (from the past 48 hour(s))  CBC     Status: Abnormal   Collection Time: 08/13/22  1:10 PM  Result Value Ref Range   WBC 9.1 4.0 -  10.5 K/uL   RBC 3.75 (L) 4.22 - 5.81 MIL/uL   Hemoglobin 9.3 (L) 13.0 - 17.0 g/dL   HCT 45.4 (L) 09.8 - 11.9 %   MCV 79.7 (L) 80.0 - 100.0 fL   MCH 24.8 (L) 26.0 - 34.0 pg   MCHC 31.1 30.0 - 36.0 g/dL   RDW 14.7 (H) 82.9 - 56.2 %   Platelets 297 150 - 400 K/uL   nRBC 0.0 0.0 - 0.2 %    Comment: Performed at [facility], [address] [place], [place] [address]  Basic metabolic panel     Status: Abnormal   Collection Time: 08/13/22  1:10 PM  Result Value Ref Range   Sodium 130 (L) 135 - 145 mmol/L   Potassium 3.6 3.5 - 5.1 mmol/L   Chloride 88 (L) 98 - 111 mmol/L   CO2 27 22 - 32 mmol/L   Glucose, Bld 101 (H) 70 - 99 mg/dL    Comment: Glucose reference range applies only to samples taken after fasting for at least 8 hours.   BUN 157 (H) 8 - 23 mg/dL   Creatinine, Ser 5.78 (H) 0.61 - 1.24 mg/dL   Calcium 6.6 (L) 8.9 - 10.3 mg/dL   GFR, Estimated 25 (L) >60 mL/min    Comment: (NOTE) Calculated using the CKD-EPI Creatinine Equation (2021)    Anion gap 15 5 - 15    Comment: Performed at [facility], [address], [place], [place] [address]  Urinalysis, Routine w reflex microscopic -Urine, Clean Catch     Status: Abnormal   Collection Time: 08/13/22  1:10 PM  Result Value Ref Range   Color, Urine STRAW (A) YELLOW   APPearance CLEAR CLEAR   Specific Gravity, Urine 1.008 1.005 - 1.030   pH 5.0 5.0 - 8.0   Glucose, UA NEGATIVE NEGATIVE mg/dL   Hgb urine dipstick NEGATIVE NEGATIVE   Bilirubin Urine NEGATIVE NEGATIVE   Ketones, ur NEGATIVE NEGATIVE mg/dL   Protein, ur NEGATIVE NEGATIVE mg/dL   Nitrite NEGATIVE NEGATIVE   Leukocytes,Ua NEGATIVE NEGATIVE    Comment: Performed at [facility], [address] [place], [place] [address]   *Note: Due to a large number of results and/or encounters for the requested time period, some results have not been displayed. A complete set of results can be found in Results Review.    No current facility-administered  medications for this encounter.   Current Outpatient Medications  Medication Sig Dispense Refill   acetaminophen (TYLENOL) 500 MG tablet Take 500 mg by mouth every 6 (six) hours as needed for mild pain or moderate pain.     atorvastatin (LIPITOR) 40 MG tablet Take 1 tablet (40 mg total) by mouth daily at 6 PM. 30 tablet 1   cyanocobalamin (,VITAMIN B-12,) 1000 MCG/ML injection Inject 1,000 mcg into the muscle every 30 (thirty) days.     ELIQUIS 2.5 MG TABS tablet TAKE ONE TABLET TWICE DAILY 60 tablet 11   escitalopram (LEXAPRO) 10 MG tablet Take 10 mg by mouth in the morning.     finasteride (PROSCAR) 5 MG  tablet Take 5 mg by mouth every evening.     FLONASE SENSIMIST 27.5 MCG/SPRAY nasal spray Place 1 spray into the nose daily as needed for rhinitis or allergies.      gabapentin (NEURONTIN) 100 MG capsule Take 1 capsule (100 mg total) by mouth 2 (two) times daily. (Patient taking differently: Patient takes 2 tablets by mouth at bedtime.) 60 capsule 5   guaiFENesin (MUCINEX) 600 MG 12 hr tablet Take 600 mg by mouth in the morning.     Iron, Ferrous Sulfate, 325 (65 Fe) MG TABS Take 325 mg by mouth daily. 30 tablet 4   Loperamide HCl (IMODIUM PO) Take 2 tablets by mouth daily.     metolazone (ZAROXOLYN) 2.5 MG tablet Take 1 tablet (2.5 mg total) by mouth 2 (two) times a week. 32 tablet 3   metoprolol succinate (TOPROL-XL) 25 MG 24 hr tablet TAKE 1/2 TABLET AT BEDTIME 15 tablet 3   midodrine (PROAMATINE) 5 MG tablet Take 2 tablets (10 mg total) by mouth 3 (three) times daily with meals. 180 tablet 11   Multiple Vitamins-Minerals (PRESERVISION AREDS 2) CAPS Take 1 capsule by mouth 2 (two) times daily.     omeprazole (PRILOSEC) 20 MG capsule Take 1 capsule (20 mg total) by mouth 2 (two) times daily before a meal. 60 capsule 2   ondansetron (ZOFRAN-ODT) 4 MG disintegrating tablet Take 1 tablet (4 mg total) by mouth every 8 (eight) hours as needed for nausea or vomiting. 20 tablet 0   oxyCODONE (OXY  IR/ROXICODONE) 5 MG immediate release tablet Take 5 mg by mouth 2 (two) times daily as needed.     OXYGEN Inhale 2 L/min into the lungs. As needed and at night     potassium chloride SA (KLOR-CON M) 20 MEQ tablet Take 4 tablets (80 mEq total) by mouth in the morning AND 2 tablets (40 mEq total) every evening. (Patient taking differently: Take 4 tablets (80 mEq total) by mouth in the morning AND 2 tablets (40 mEq total) every evening. Take a extra tablet on Tuesday and Friday.) 180 tablet 6   Riociguat (ADEMPAS) 2.5 MG TABS Take 2.5 mg by mouth 3 (three) times daily. 90 tablet 11   rOPINIRole (REQUIP) 2 MG tablet TAKE ONE TABLET THREE TIMES DAILY 90 tablet 4   Selexipag (UPTRAVI) 1600 MCG TABS Take 1,600 mcg by mouth 2 (two) times daily. 0800 and 1900     Selexipag 400 MCG TABS Take 400 mcg by mouth 2 (two) times daily. 0800 and 1900     torsemide (DEMADEX) 100 MG tablet TAKE ONE TABLET TWICE DAILY 180 tablet 3   TRAZODONE HCL ER PO Take 1 tablet by mouth at bedtime.      Musculoskeletal: Strength & Muscle Tone:  Did not get patient out of bed to assess. Gait & Station:  Did not get patient out of bed to assess. Patient leans:  Did not get patient out of bed to assess.   Psychiatric Specialty Exam: Presentation  General Appearance:  Disheveled  Eye Contact: Fair  Speech: Clear and Coherent  Speech Volume: Normal  Handedness: Right   Mood and Affect  Mood: Euthymic  Affect: Appropriate; Congruent   Thought Process  Thought Processes: Coherent  Descriptions of Associations:Intact  Orientation:Partial (Oriented to person and place)  Thought Content:Logical  History of Schizophrenia/Schizoaffective disorder:No data recorded Duration of Psychotic Symptoms:No data recorded Hallucinations:Hallucinations: None  Ideas of Reference:None  Suicidal Thoughts:Suicidal Thoughts: Yes, Passive SI Passive Intent and/or Plan: Without  Intent; Without Plan; Without Means to  Carry Out  Homicidal Thoughts:Homicidal Thoughts: No   Sensorium  Memory: Immediate Fair; Recent Fair; Remote Fair  Judgment: Intact  Insight: Good   Executive Functions  Concentration: Good  Attention Span: Good  Recall: Fair  Fund of Knowledge: Good  Language: Good   Psychomotor Activity  Psychomotor Activity: Psychomotor Activity: Normal   Assets  Assets: Communication Skills; Housing; Social Support; Leisure Time    Sleep  Sleep: Sleep: Fair   Physical Exam: Physical Exam Vitals and nursing note reviewed.  Constitutional:      Appearance: Normal appearance.  Skin:    General: Skin is dry.  Neurological:     Mental Status: He is alert. He is disoriented.     Comments: Alert and oriented to person and place, not oriented to time    Review of Systems  HENT:  Positive for hearing loss.   Eyes:        Vision loss  Psychiatric/Behavioral:  Positive for memory loss and suicidal ideas.   All other systems reviewed and are negative.  Blood pressure 106/63, pulse 76, temperature 98.6 F (37 C), temperature source Oral, resp. rate 10, height 5\' 3"  (1.6 m), weight 85.3 kg, SpO2 99%. Body mass index is 33.3 kg/m.  Medical Decision Making: Patient case reviewed and discussed with Dr Viviano Simas.  Patient does not meet inpatient criteria for psychiatric treatment; he is not a danger to himself or others. Patient is able to contract for safety.  Patient is psychiatrically cleared.   Problem 1: Adjustment Disorder -Discussed counseling with patient and provided resources in AVS.  Disposition: No evidence of imminent risk to self or others at present.   Patient does not meet criteria for psychiatric inpatient admission.  Thomes Lolling, NP 08/13/2022 3:24 PM

## 2022-08-18 NOTE — Progress Notes (Signed)
 [facility]link Summary Report / Loop Recorder 

## 2022-08-22 ENCOUNTER — Other Ambulatory Visit: Payer: Self-pay

## 2022-08-22 ENCOUNTER — Inpatient Hospital Stay (HOSPITAL_COMMUNITY)
Admission: EM | Admit: 2022-08-22 | Discharge: 2022-09-02 | DRG: 291 | Disposition: E | Payer: Medicare HMO | Source: Skilled Nursing Facility | Attending: Internal Medicine | Admitting: Internal Medicine

## 2022-08-22 ENCOUNTER — Emergency Department (HOSPITAL_COMMUNITY): Payer: Medicare HMO

## 2022-08-22 ENCOUNTER — Encounter (HOSPITAL_COMMUNITY): Payer: Self-pay

## 2022-08-22 DIAGNOSIS — I130 Hypertensive heart and chronic kidney disease with heart failure and stage 1 through stage 4 chronic kidney disease, or unspecified chronic kidney disease: Principal | ICD-10-CM | POA: Diagnosis present

## 2022-08-22 DIAGNOSIS — I5033 Acute on chronic diastolic (congestive) heart failure: Secondary | ICD-10-CM | POA: Diagnosis present

## 2022-08-22 DIAGNOSIS — H3530 Unspecified macular degeneration: Secondary | ICD-10-CM | POA: Diagnosis present

## 2022-08-22 DIAGNOSIS — Z8042 Family history of malignant neoplasm of prostate: Secondary | ICD-10-CM

## 2022-08-22 DIAGNOSIS — Z8673 Personal history of transient ischemic attack (TIA), and cerebral infarction without residual deficits: Secondary | ICD-10-CM

## 2022-08-22 DIAGNOSIS — Z7189 Other specified counseling: Secondary | ICD-10-CM

## 2022-08-22 DIAGNOSIS — I878 Other specified disorders of veins: Secondary | ICD-10-CM | POA: Diagnosis present

## 2022-08-22 DIAGNOSIS — M545 Low back pain, unspecified: Secondary | ICD-10-CM | POA: Diagnosis present

## 2022-08-22 DIAGNOSIS — I890 Lymphedema, not elsewhere classified: Secondary | ICD-10-CM | POA: Diagnosis present

## 2022-08-22 DIAGNOSIS — G4700 Insomnia, unspecified: Secondary | ICD-10-CM | POA: Diagnosis present

## 2022-08-22 DIAGNOSIS — I270 Primary pulmonary hypertension: Secondary | ICD-10-CM

## 2022-08-22 DIAGNOSIS — N179 Acute kidney failure, unspecified: Secondary | ICD-10-CM | POA: Diagnosis present

## 2022-08-22 DIAGNOSIS — K219 Gastro-esophageal reflux disease without esophagitis: Secondary | ICD-10-CM | POA: Diagnosis present

## 2022-08-22 DIAGNOSIS — R5382 Chronic fatigue, unspecified: Secondary | ICD-10-CM | POA: Diagnosis present

## 2022-08-22 DIAGNOSIS — F32A Depression, unspecified: Secondary | ICD-10-CM | POA: Diagnosis present

## 2022-08-22 DIAGNOSIS — F419 Anxiety disorder, unspecified: Secondary | ICD-10-CM | POA: Diagnosis present

## 2022-08-22 DIAGNOSIS — Z79899 Other long term (current) drug therapy: Secondary | ICD-10-CM

## 2022-08-22 DIAGNOSIS — I272 Pulmonary hypertension, unspecified: Secondary | ICD-10-CM | POA: Diagnosis present

## 2022-08-22 DIAGNOSIS — Z923 Personal history of irradiation: Secondary | ICD-10-CM

## 2022-08-22 DIAGNOSIS — Z87891 Personal history of nicotine dependence: Secondary | ICD-10-CM

## 2022-08-22 DIAGNOSIS — Z7901 Long term (current) use of anticoagulants: Secondary | ICD-10-CM

## 2022-08-22 DIAGNOSIS — N400 Enlarged prostate without lower urinary tract symptoms: Secondary | ICD-10-CM | POA: Diagnosis present

## 2022-08-22 DIAGNOSIS — K5900 Constipation, unspecified: Secondary | ICD-10-CM | POA: Diagnosis present

## 2022-08-22 DIAGNOSIS — I480 Paroxysmal atrial fibrillation: Secondary | ICD-10-CM | POA: Diagnosis present

## 2022-08-22 DIAGNOSIS — E785 Hyperlipidemia, unspecified: Secondary | ICD-10-CM | POA: Diagnosis present

## 2022-08-22 DIAGNOSIS — Z823 Family history of stroke: Secondary | ICD-10-CM

## 2022-08-22 DIAGNOSIS — G8929 Other chronic pain: Secondary | ICD-10-CM | POA: Diagnosis present

## 2022-08-22 DIAGNOSIS — Z8249 Family history of ischemic heart disease and other diseases of the circulatory system: Secondary | ICD-10-CM

## 2022-08-22 DIAGNOSIS — E871 Hypo-osmolality and hyponatremia: Secondary | ICD-10-CM | POA: Diagnosis present

## 2022-08-22 DIAGNOSIS — J4489 Other specified chronic obstructive pulmonary disease: Secondary | ICD-10-CM | POA: Diagnosis present

## 2022-08-22 DIAGNOSIS — I959 Hypotension, unspecified: Secondary | ICD-10-CM

## 2022-08-22 DIAGNOSIS — E876 Hypokalemia: Secondary | ICD-10-CM | POA: Diagnosis present

## 2022-08-22 DIAGNOSIS — Z820 Family history of epilepsy and other diseases of the nervous system: Secondary | ICD-10-CM

## 2022-08-22 DIAGNOSIS — I4719 Other supraventricular tachycardia: Secondary | ICD-10-CM | POA: Diagnosis present

## 2022-08-22 DIAGNOSIS — I5084 End stage heart failure: Secondary | ICD-10-CM | POA: Diagnosis present

## 2022-08-22 DIAGNOSIS — L8990 Pressure ulcer of unspecified site, unspecified stage: Secondary | ICD-10-CM | POA: Insufficient documentation

## 2022-08-22 DIAGNOSIS — Z9981 Dependence on supplemental oxygen: Secondary | ICD-10-CM

## 2022-08-22 DIAGNOSIS — I9589 Other hypotension: Secondary | ICD-10-CM | POA: Diagnosis present

## 2022-08-22 DIAGNOSIS — Z515 Encounter for palliative care: Secondary | ICD-10-CM

## 2022-08-22 DIAGNOSIS — Z8572 Personal history of non-Hodgkin lymphomas: Secondary | ICD-10-CM

## 2022-08-22 DIAGNOSIS — J9621 Acute and chronic respiratory failure with hypoxia: Secondary | ICD-10-CM | POA: Diagnosis present

## 2022-08-22 DIAGNOSIS — Z66 Do not resuscitate: Secondary | ICD-10-CM | POA: Diagnosis present

## 2022-08-22 DIAGNOSIS — N184 Chronic kidney disease, stage 4 (severe): Secondary | ICD-10-CM | POA: Diagnosis not present

## 2022-08-22 DIAGNOSIS — D631 Anemia in chronic kidney disease: Secondary | ICD-10-CM | POA: Diagnosis present

## 2022-08-22 DIAGNOSIS — E1122 Type 2 diabetes mellitus with diabetic chronic kidney disease: Secondary | ICD-10-CM | POA: Diagnosis present

## 2022-08-22 DIAGNOSIS — Z85820 Personal history of malignant melanoma of skin: Secondary | ICD-10-CM

## 2022-08-22 DIAGNOSIS — J849 Interstitial pulmonary disease, unspecified: Secondary | ICD-10-CM | POA: Diagnosis present

## 2022-08-22 DIAGNOSIS — G2581 Restless legs syndrome: Secondary | ICD-10-CM | POA: Diagnosis present

## 2022-08-22 DIAGNOSIS — R197 Diarrhea, unspecified: Secondary | ICD-10-CM | POA: Diagnosis not present

## 2022-08-22 DIAGNOSIS — Z1152 Encounter for screening for COVID-19: Secondary | ICD-10-CM

## 2022-08-22 DIAGNOSIS — Z818 Family history of other mental and behavioral disorders: Secondary | ICD-10-CM

## 2022-08-22 LAB — BASIC METABOLIC PANEL
Anion gap: 18 — ABNORMAL HIGH (ref 5–15)
Anion gap: 19 — ABNORMAL HIGH (ref 5–15)
BUN: 151 mg/dL — ABNORMAL HIGH (ref 8–23)
BUN: 147 mg/dL — ABNORMAL HIGH (ref 8–23)
CO2: 27 mmol/L (ref 22–32)
CO2: 28 mmol/L (ref 22–32)
Calcium: 6.2 mg/dL — CL (ref 8.9–10.3)
Calcium: 6.5 mg/dL — ABNORMAL LOW (ref 8.9–10.3)
Chloride: 84 mmol/L — ABNORMAL LOW (ref 98–111)
Chloride: 84 mmol/L — ABNORMAL LOW (ref 98–111)
Creatinine, Ser: 2.63 mg/dL — ABNORMAL HIGH (ref 0.61–1.24)
Creatinine, Ser: 2.44 mg/dL — ABNORMAL HIGH (ref 0.61–1.24)
GFR, Estimated: 23 mL/min — ABNORMAL LOW (ref >60)
GFR, Estimated: 25 mL/min — ABNORMAL LOW (ref >60)
Glucose, Bld: 100 mg/dL — ABNORMAL HIGH (ref 70–99)
Glucose, Bld: 120 mg/dL — ABNORMAL HIGH (ref 70–99)
Potassium: 3.5 mmol/L (ref 3.5–5.1)
Potassium: 2.9 mmol/L — ABNORMAL LOW (ref 3.5–5.1)
Sodium: 129 mmol/L — ABNORMAL LOW (ref 135–145)
Sodium: 131 mmol/L — ABNORMAL LOW (ref 135–145)

## 2022-08-22 LAB — CBC WITH DIFFERENTIAL/PLATELET
Abs Immature Granulocytes: 0.04 10*3/uL (ref 0.00–0.07)
Basophils Absolute: 0 10*3/uL (ref 0.0–0.1)
Basophils Relative: 0 %
Eosinophils Absolute: 0.2 10*3/uL (ref 0.0–0.5)
Eosinophils Relative: 2 %
HCT: 27.5 % — ABNORMAL LOW (ref 39.0–52.0)
Hemoglobin: 8.5 g/dL — ABNORMAL LOW (ref 13.0–17.0)
Immature Granulocytes: 1 %
Lymphocytes Relative: 8 %
Lymphs Abs: 0.6 10*3/uL — ABNORMAL LOW (ref 0.7–4.0)
MCH: 24.6 pg — ABNORMAL LOW (ref 26.0–34.0)
MCHC: 30.9 g/dL (ref 30.0–36.0)
MCV: 79.7 fL — ABNORMAL LOW (ref 80.0–100.0)
Monocytes Absolute: 0.7 10*3/uL (ref 0.1–1.0)
Monocytes Relative: 9 %
Neutro Abs: 6.2 10*3/uL (ref 1.7–7.7)
Neutrophils Relative %: 80 %
Platelets: 295 10*3/uL (ref 150–400)
RBC: 3.45 MIL/uL — ABNORMAL LOW (ref 4.22–5.81)
RDW: 16.5 % — ABNORMAL HIGH (ref 11.5–15.5)
WBC: 7.7 10*3/uL (ref 4.0–10.5)
nRBC: 0 % (ref 0.0–0.2)

## 2022-08-22 LAB — D-DIMER, QUANTITATIVE: D-Dimer, Quant: 0.94 ug/mL-FEU — ABNORMAL HIGH (ref 0.00–0.50)

## 2022-08-22 LAB — I-STAT VENOUS BLOOD GAS, ED
Acid-Base Excess: 4 mmol/L — ABNORMAL HIGH (ref 0.0–2.0)
Bicarbonate: 29.6 mmol/L — ABNORMAL HIGH (ref 20.0–28.0)
Calcium, Ion: 0.74 mmol/L — CL (ref 1.15–1.40)
HCT: 28 % — ABNORMAL LOW (ref 39.0–52.0)
Hemoglobin: 9.5 g/dL — ABNORMAL LOW (ref 13.0–17.0)
O2 Saturation: 94 %
Potassium: 3.4 mmol/L — ABNORMAL LOW (ref 3.5–5.1)
Sodium: 126 mmol/L — ABNORMAL LOW (ref 135–145)
TCO2: 31 mmol/L (ref 22–32)
pCO2, Ven: 49.5 mmHg (ref 44–60)
pH, Ven: 7.385 (ref 7.25–7.43)
pO2, Ven: 71 mmHg — ABNORMAL HIGH (ref 32–45)

## 2022-08-22 LAB — BRAIN NATRIURETIC PEPTIDE: B Natriuretic Peptide: 162.3 pg/mL — ABNORMAL HIGH (ref 0.0–100.0)

## 2022-08-22 LAB — SARS CORONAVIRUS 2 BY RT PCR: SARS Coronavirus 2 by RT PCR: NEGATIVE

## 2022-08-22 MED ORDER — GABAPENTIN 100 MG PO CAPS
100.0000 mg | ORAL_CAPSULE | Freq: Two times a day (BID) | ORAL | Status: DC
  Administered 2022-08-22 – 2022-08-27 (×10): 100 mg via ORAL
  Filled 2022-08-22 (×10): qty 1

## 2022-08-22 MED ORDER — ESCITALOPRAM OXALATE 10 MG PO TABS
10.0000 mg | ORAL_TABLET | Freq: Every day | ORAL | Status: DC
  Administered 2022-08-23 – 2022-08-27 (×5): 10 mg via ORAL
  Filled 2022-08-22 (×5): qty 1

## 2022-08-22 MED ORDER — ESCITALOPRAM OXALATE 10 MG PO TABS: 10.0000 mg | ORAL_TABLET | Freq: Every morning | ORAL | Status: DC

## 2022-08-22 MED ORDER — OXYCODONE HCL 5 MG PO TABS
5.0000 mg | ORAL_TABLET | Freq: Once | ORAL | Status: AC
  Administered 2022-08-22: 5 mg via ORAL
  Filled 2022-08-22: qty 1

## 2022-08-22 MED ORDER — ACETAMINOPHEN 325 MG PO TABS
650.0000 mg | ORAL_TABLET | Freq: Four times a day (QID) | ORAL | Status: DC | PRN
  Administered 2022-08-24: 650 mg via ORAL
  Filled 2022-08-22 (×2): qty 2

## 2022-08-22 MED ORDER — ACETAMINOPHEN 500 MG PO TABS
1000.0000 mg | ORAL_TABLET | Freq: Once | ORAL | Status: AC
  Administered 2022-08-22: 1000 mg via ORAL
  Filled 2022-08-22: qty 2

## 2022-08-22 MED ORDER — MIDODRINE HCL 5 MG PO TABS
10.0000 mg | ORAL_TABLET | Freq: Three times a day (TID) | ORAL | Status: DC
  Administered 2022-08-22 – 2022-08-25 (×10): 10 mg via ORAL
  Filled 2022-08-22 (×10): qty 2

## 2022-08-22 MED ORDER — IPRATROPIUM-ALBUTEROL 0.5-2.5 (3) MG/3ML IN SOLN
3.0000 mL | Freq: Once | RESPIRATORY_TRACT | Status: AC
  Administered 2022-08-22: 3 mL via RESPIRATORY_TRACT
  Filled 2022-08-22: qty 3

## 2022-08-22 MED ORDER — RIOCIGUAT 2.5 MG PO TABS: 2.5000 mg | ORAL_TABLET | Freq: Three times a day (TID) | ORAL | Status: DC

## 2022-08-22 MED ORDER — FUROSEMIDE 10 MG/ML IJ SOLN
4.0000 mg/h | INTRAVENOUS | Status: DC
  Administered 2022-08-22: 4 mg/h via INTRAVENOUS
  Filled 2022-08-22: qty 20

## 2022-08-22 MED ORDER — APIXABAN 2.5 MG PO TABS
2.5000 mg | ORAL_TABLET | Freq: Two times a day (BID) | ORAL | Status: DC
  Administered 2022-08-22 – 2022-08-27 (×10): 2.5 mg via ORAL
  Filled 2022-08-22 (×10): qty 1

## 2022-08-22 MED ORDER — SENNOSIDES-DOCUSATE SODIUM 8.6-50 MG PO TABS
1.0000 | ORAL_TABLET | Freq: Every evening | ORAL | Status: DC | PRN
  Administered 2022-08-23: 1 via ORAL
  Filled 2022-08-22: qty 1

## 2022-08-22 MED ORDER — POTASSIUM CHLORIDE CRYS ER 20 MEQ PO TBCR
40.0000 meq | EXTENDED_RELEASE_TABLET | Freq: Two times a day (BID) | ORAL | Status: DC
  Administered 2022-08-22: 40 meq via ORAL
  Filled 2022-08-22: qty 2

## 2022-08-22 MED ORDER — OXYCODONE HCL 5 MG PO TABS
5.0000 mg | ORAL_TABLET | Freq: Two times a day (BID) | ORAL | Status: DC | PRN
  Administered 2022-08-22 – 2022-08-24 (×4): 5 mg via ORAL
  Filled 2022-08-22 (×5): qty 1

## 2022-08-22 MED ORDER — SELEXIPAG 400 MCG PO TABS: 400.0000 ug | ORAL_TABLET | Freq: Two times a day (BID) | ORAL | Status: DC

## 2022-08-22 MED ORDER — POTASSIUM CHLORIDE 10 MEQ/100ML IV SOLN
10.0000 meq | INTRAVENOUS | Status: AC
  Administered 2022-08-22 – 2022-08-23 (×4): 10 meq via INTRAVENOUS
  Filled 2022-08-22 (×4): qty 100

## 2022-08-22 MED ORDER — ROPINIROLE HCL 0.5 MG PO TABS
2.0000 mg | ORAL_TABLET | Freq: Three times a day (TID) | ORAL | Status: DC
  Administered 2022-08-22 – 2022-08-27 (×14): 2 mg via ORAL
  Filled 2022-08-22 (×14): qty 4

## 2022-08-22 MED ORDER — SELEXIPAG 1600 MCG PO TABS: 1600.0000 ug | ORAL_TABLET | Freq: Two times a day (BID) | ORAL | Status: DC

## 2022-08-22 MED ORDER — FUROSEMIDE 10 MG/ML IJ SOLN: 60.0000 mg | Freq: Once | INTRAMUSCULAR | Status: DC

## 2022-08-22 MED ORDER — ACETAMINOPHEN 650 MG RE SUPP: 650.0000 mg | Freq: Four times a day (QID) | RECTAL | Status: DC | PRN

## 2022-08-22 MED ORDER — PANTOPRAZOLE SODIUM 40 MG PO TBEC
40.0000 mg | DELAYED_RELEASE_TABLET | Freq: Every day | ORAL | Status: DC
  Administered 2022-08-22 – 2022-08-27 (×6): 40 mg via ORAL
  Filled 2022-08-22 (×6): qty 1

## 2022-08-22 MED ORDER — ATORVASTATIN CALCIUM 40 MG PO TABS
40.0000 mg | ORAL_TABLET | Freq: Every day | ORAL | Status: DC
  Administered 2022-08-22 – 2022-08-25 (×4): 40 mg via ORAL
  Filled 2022-08-22 (×4): qty 1

## 2022-08-22 MED ORDER — CALCIUM GLUCONATE-NACL 1-0.675 GM/50ML-% IV SOLN
1.0000 g | Freq: Once | INTRAVENOUS | Status: AC
  Administered 2022-08-22: 1000 mg via INTRAVENOUS
  Filled 2022-08-22: qty 50

## 2022-08-22 NOTE — Plan of Care (Signed)

## 2022-08-22 NOTE — ED Notes (Signed)
Notified DO that patient placement says pt has to be progressive d/t pt being on lasix gtt.

## 2022-08-22 NOTE — Hospital Course (Addendum)
#  End-stage CHF, pulmonary HTN, A-fib, and CKD Patient presented to the ED with acute on chronic hypoxia and edema.  In the weeks prior to admission, he had increasing lower extremity edema and fatigue.  He was also admitted with abnormal electrolyte values.  He was started on Lasix drip and developed a critically low potassium.  He remained volume overloaded despite interventions.  He developed increasing oxygen requirements.  He soon developed atrial fibrillation with RVR and persistent hypotension with low MAPs.  Due to concerns of patient's worsening clinical status and multiple, severe chronic conditions, the decision was made to focus on comfort and pursue hospice at [place].  The plan was to transition to oral Lasix and amiodarone to help keep the patient comfortable but not being volume overloaded and tachycardic, but the patient acutely declined.  The previous day, he was responsive to voice, but he soon became nonresponsive to voice and unable to awaken after sternal rub.  With the assistance of the wife and palliative, the decision was made to pursue a 100% comfort approach by discontinuing Lasix and amiodarone and starting the patient on Dilaudid drip.

## 2022-08-22 NOTE — ED Notes (Signed)
Pt repositioned in bed for comfort and work of breathing.

## 2022-08-22 NOTE — ED Provider Notes (Signed)
[place] EMERGENCY DEPARTMENT AT [facility] Provider Note   CSN: [id number] Arrival date & time: 08/05/2022  1040     History  Chief Complaint  Patient presents with   Shortness of Breath    Ryan [name] is a 89 y.o. male with PMH as listed below, relevat COPD on 2L home [place] and HFpEF, prior non-hodgkin's lymphoma (pelvic mass status post radiation therapy), pulmonary hypertension, multifocal atrial tachycardia, history of CVA, paroxysmal atrial fibrillation on Eliquis, who presents BIB [facility] from [place] at [facility] c/o low oxygen and increased bilateral leg swelling.  States the facility told him he had an oxygen of 74% on his home 2 L nasal cannula this morning which was concerning to him.  He states he always feels short of breath and does not feel more short of breath right now than he normally does.  Denies any chest pain, worsening cough, fevers or chills, nausea vomiting.  Unknown how long the welling has been worse because he is blind d/t mac degen, but states that the RNs at the facility told him it was worsening. Endorses pain in legs bilaterally as well.  He takes Eliquis for A-fib.  He has never had a blood clot in his legs or in his chest that he is aware of.  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 Patient was seen in the ER 05/04/22 with dyspnea and increased edema. He was given extra metolazone for several days with good diuresis.   Patient was seen in the ED on 08/13/2022 for hypotension and passive SI.  He does deal with chronic hypotension and was discharged back to his facility after evaluation by psychiatry.  Pt is normally on 2L [place], per staff he was sating in the 70's on 4L when EMS arrived he was in the 80's they bumped him up to 6L and now pt is 98% on 6L.    Past Medical  History:  Diagnosis Date   Asthma    Barrett's esophagus    Benign prostatic hypertrophy    Bilateral lower extremity edema    CHF (congestive heart failure) (HCC)    Chronic fatigue    COPD (chronic obstructive pulmonary disease) (HCC)    Diabetes mellitus type 2 in nonobese (HCC) 04/24/2019   Diverticulosis of colon (without mention of hemorrhage)    Esophageal reflux    Gastritis    Gluten intolerance    Hiatal hernia 02/10/2011   Noted on CT Scan - Moderate Hiatal Hernia   Hyperlipemia    Hypertension    Lymphoma of lymph nodes in pelvis (HCC) 03/03/2011    Large Right Retroperitoneal Mass   Melanoma (HCC)    lymphoma   Microcytic anemia 04/20/2011   NHL (non-Hodgkin's 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 Loreta [address]   Pulmonary hyperinflation    Renal cyst 02/10/2011    Noted on CT Scan - Bilateral Renal Cysts   S/P radiation therapy 03/15/11 - 04/09/11   Abdominal/ Pelvic Tumor, 3600 cGy/20 Fractions   Sleep apnea        Home Medications Prior to Admission medications   Medication Sig Start Date End  Date Taking? Authorizing Provider  acetaminophen (TYLENOL) 500 MG tablet Take 500 mg by mouth every 6 (six) hours as needed for mild pain or moderate pain.    [provider]  atorvastatin (LIPITOR) 40 MG tablet Take 1 tablet (40 mg total) by mouth daily at 6 PM. 04/27/19   Kathlen Mody, MD  cyanocobalamin (,VITAMIN B-12,) 1000 MCG/ML injection Inject 1,000 mcg into the muscle every 30 (thirty) days.    [provider]  ELIQUIS 2.5 MG TABS tablet TAKE ONE TABLET TWICE DAILY 07/29/22   Laurey Morale, MD  escitalopram (LEXAPRO) 10 MG tablet Take 10 mg by mouth in the morning.    [provider]  finasteride (PROSCAR) 5 MG tablet Take 5 mg by mouth every evening.     [provider]  FLONASE SENSIMIST 27.5 MCG/SPRAY nasal spray Place 1 spray into the nose daily as needed for rhinitis or allergies.  12/11/18   [provider]  gabapentin (NEURONTIN) 100 MG capsule Take 1 capsule (100 mg total) by mouth 2 (two) times daily. Patient taking differently: Patient takes 2 tablets by mouth at bedtime. 06/03/22   Butch Penny, NP  guaiFENesin (MUCINEX) 600 MG 12 hr tablet Take 600 mg by mouth in the morning.    [provider]  Iron, Ferrous Sulfate, 325 (65 Fe) MG TABS Take 325 mg by mouth daily. 12/31/20   [place], Anderson Malta, FNP  Loperamide HCl (IMODIUM PO) Take 2 tablets by mouth daily.    [provider]  metolazone (ZAROXOLYN) 2.5 MG tablet Take 1 tablet (2.5 mg total) by mouth 2 (two) times a week. 08/02/22   Alen Bleacher, NP  metoprolol succinate (TOPROL-XL) 25 MG 24 hr tablet TAKE 1/2 TABLET AT BEDTIME 05/19/22   Laurey Morale, MD  midodrine (PROAMATINE) 5 MG tablet Take 2 tablets (10 mg total) by mouth 3 (three) times daily with meals. 08/02/22   Alen Bleacher, NP  Multiple Vitamins-Minerals (PRESERVISION AREDS 2) CAPS Take 1 capsule by mouth 2 (two) times daily.    [provider]  omeprazole (PRILOSEC) 20 MG capsule Take 1 capsule (20 mg total) by mouth 2 (two) times daily before a meal. 09/16/21   Imogene Burn, MD  ondansetron (ZOFRAN-ODT) 4 MG disintegrating tablet Take 1 tablet (4 mg total) by mouth every 8 (eight) hours as needed for nausea or vomiting. 02/08/22   Ghimire, Werner Lean, MD  oxyCODONE (OXY IR/ROXICODONE) 5 MG immediate release tablet Take 5 mg by mouth 2 (two) times daily as needed. 06/14/22   [provider]  OXYGEN Inhale 2 L/min into the lungs. As needed and at night    [provider]  potassium chloride SA (KLOR-CON M) 20 MEQ tablet Take 4 tablets (80 mEq total) by mouth in the morning AND 2 tablets (40 mEq total) every evening. Patient taking differently: Take 4 tablets (80 mEq  total) by mouth in the morning AND 2 tablets (40 mEq total) every evening. Take a extra tablet on Tuesday and Friday. 03/04/22   Laurey Morale, MD  Riociguat (ADEMPAS) 2.5 MG TABS Take 2.5 mg by mouth 3 (three) times daily. 07/22/22   Laurey Morale, MD  rOPINIRole (REQUIP) 2 MG tablet TAKE ONE TABLET THREE TIMES DAILY 05/10/22   Butch Penny, NP  Selexipag (UPTRAVI) 1600 MCG TABS Take 1,600 mcg by mouth 2 (two) times daily. 0800 and 1900    [provider]  Selexipag 400 MCG TABS Take 400 mcg  by mouth 2 (two) times daily. 0800 and 1900    [provider]  torsemide (DEMADEX) 100 MG tablet TAKE ONE TABLET TWICE DAILY 09/24/21   Jacklynn Ganong, FNP  TRAZODONE HCL ER PO Take 1 tablet by mouth at bedtime.    [provider]      Allergies    Pollen extract-tree extract, Feraheme [ferumoxytol], and Molds & smuts    Review of Systems   Review of Systems A 10 point review of systems was performed and is negative unless otherwise reported in HPI.  Physical Exam Updated Vital Signs BP (!) 106/58 (BP Location: Right Arm)   Pulse (!) 114   Temp 97.7 F (36.5 C) (Oral)   Resp (!) 28   SpO2 99%  Physical Exam General: Normal appearing male, lying in bed.  HEENT: Sclera anicteric, MMM, trachea midline.  Cardiology: Irregularly irregular heartbeat, no murmurs/rubs/gallops.  Resp: Mildly increased work of breathing on 6 L nasal cannula with prolonged expiratory phase.  Mild to moderate tachypnea.  Mild inspiratory wheezing in the right lower lung fields, otherwise no wheezing.  No crackles or rhonchi noted. Abd: Soft, non-tender, non-distended. No rebound tenderness or guarding.  GU: Deferred. MSK: Severe 4+ pitting bilateral lower extremity edema with chronic venous stasis changes and erythema bilaterally.  Please see photo below.  Bilateral feet are warm.  Unable to palpate pulses due to severe swelling however intact cap refill. Skin: warm, dry.  Neuro: A&Ox4,  CNs II-XII grossly intact. MAEs. Sensation grossly intact.  Psych: Flat affect.         ED Results / Procedures / Treatments   Labs (all labs ordered are listed, but only abnormal results are displayed) Labs Reviewed  SARS CORONAVIRUS 2 BY RT PCR  CBC WITH DIFFERENTIAL/PLATELET  BASIC METABOLIC PANEL  BRAIN NATRIURETIC PEPTIDE  D-DIMER, QUANTITATIVE  I-STAT VENOUS BLOOD GAS, ED    EKG None  Radiology No results found.  Procedures Procedures    Medications Ordered in ED Medications  ipratropium-albuterol (DUONEB) 0.5-2.5 (3) MG/3ML nebulizer solution 3 mL (has no administration in time range)  oxyCODONE (Oxy IR/ROXICODONE) immediate release tablet 5 mg (has no administration in time range)  acetaminophen (TYLENOL) tablet 1,000 mg (has no administration in time range)    ED Course/ Medical Decision Making/ A&P                          Medical Decision Making Amount and/or Complexity of Data Reviewed Labs: ordered. Decision-making details documented in ED Course. Radiology: ordered. Decision-making details documented in ED Course.  Risk OTC drugs. Prescription drug management. Decision regarding hospitalization.    This patient presents to the ED for concern of hypoxia, LEE, this involves an extensive number of treatment options, and is a complaint that carries with it a high risk of complications and morbidity.  I considered the following differential and admission for this acute, potentially life threatening condition.   MDM:    DDX for dyspnea includes but is not limited to:  Consider CHF exacerbation given worsening swelling noted by the facility however patient cannot state how bad they are.  No crackles on exam, will evaluate for pulmonary edema.  He did have a presentation in April for swelling and shortness of breath which resolved with metolazone.  He states they are painful bilaterally and he is essentially bedbound.  He does take Eliquis but cannot  rule out PE and will obtain a D-dimer.  No  significant wheezing to suggest a COPD exacerbation though he does have some wheezing on the right side and will give a DuoNeb and reassess.  He has no chest pain to indicate MI and his EKG demonstrates his rate controlled atrial fibrillation which is normal for him, no signs of ischemia. Also w/ known ILD and pulm HTN with likely tenuous volume status. He has not had any worsening cough or fever/chills to indicate pneumonia however he is tachycardic and tachypneic on arrival, will check labs and chest x-ray.   Clinical Course as of 08/29/22 5366  Wynelle Link Aug 22, 2022  1243 DG Chest Portable 1 View IMPRESSION: 1. Low lung volumes with suspected interstitial edema. 2. Large hiatal hernia.   [HN]  1356 D-Dimer, Quant(!): 0.94 Mildly elevated by age-adjusted cutoff but negative by years criteria [HN]  1356 WBC: 7.7 No leukocytosis  [HN]  1356 I-Stat venous blood gas, ([facility] ED, [facility], [facility])(!!) No hypercarbia [HN]  1357 Hemoglobin(!): 8.5 Mildly decreased Hgb from baseline 9-10 [HN]  1421 Patient is reevaluated and he feels improved after the oxycodone for his back pain as well as a little bit improved from the DuoNeb from a breathing standpoint.  However his blood pressure now is in the 80s systolic.  Takes midodrine at home. Will give his 10 mg home dose now. Patient will need possibly blood pressure support and diuresis.  Will consult to cardiology. [HN]  1449 Calcium(!!): 6.2 Hypocalcemia - will give calcium repletion [HN]    Clinical Course User Index [HN] Loetta Rough, MD    Labs: I Ordered, and personally interpreted labs.  The pertinent results include:  those listed above  Imaging Studies ordered: I ordered imaging studies including CXR I independently visualized and interpreted imaging. I agree with the radiologist interpretation  Additional history obtained from chart review.   Cardiac Monitoring: The patient was maintained on a  cardiac monitor.  I personally viewed and interpreted the cardiac monitored which showed an underlying rhythm of: Rate controlled Afib  Reevaluation: After the interventions noted above, I reevaluated the patient and found that they have :stayed the same  Social Determinants of Health: Lives at SNF  Disposition:  Admitted to cardiology  Co morbidities that complicate the patient evaluation  Past Medical History:  Diagnosis Date   Asthma    Barrett's esophagus    Benign prostatic hypertrophy    Bilateral lower extremity edema    CHF (congestive heart failure) (HCC)    Chronic fatigue    COPD (chronic obstructive pulmonary disease) (HCC)    Diabetes mellitus type 2 in nonobese (HCC) 04/24/2019   Diverticulosis of colon (without mention of hemorrhage)    Esophageal reflux    Gastritis    Gluten intolerance    Hiatal hernia 02/10/2011   Noted on CT Scan - Moderate Hiatal Hernia   Hyperlipemia    Hypertension    Lymphoma of lymph nodes in pelvis (HCC) 03/03/2011    Large Right Retroperitoneal Mass   Melanoma (HCC)    lymphoma   Microcytic anemia 04/20/2011   NHL (non-Hodgkin's 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 Loreta Ave   Pulmonary hyperinflation    Renal cyst 02/10/2011    Noted on CT Scan - Bilateral Renal Cysts   S/P radiation therapy 03/15/11 - 04/09/11   Abdominal/ Pelvic Tumor,  3600 cGy/20 Fractions   Sleep apnea      Medicines Meds ordered this encounter  Medications   ipratropium-albuterol (DUONEB) 0.5-2.5 (3) MG/3ML nebulizer solution 3 mL   oxyCODONE (Oxy IR/ROXICODONE) immediate release tablet 5 mg   acetaminophen (TYLENOL) tablet 1,000 mg    I have reviewed the patients home medicines and have made adjustments as needed  Problem List / ED Course: Problem List Items  Addressed This Visit       Cardiovascular and Mediastinum   CHF exacerbation (HCC) - Primary   Hypotension     Respiratory   Acute on chronic respiratory failure with hypoxia (HCC)                This note was created using dictation software, which may contain spelling or grammatical errors.    Loetta Rough, MD 08/29/22 [phone]

## 2022-08-22 NOTE — ED Notes (Signed)
ED TO INPATIENT HANDOFF REPORT  ED Nurse Name and Phone #: [phone] Ryan Ar RN  S Name/Age/Gender Ryan [name] 89 y.o. male Room/Bed: 037C/037C  Code Status   Code Status: DNR  Home/SNF/Other Skilled nursing facility Patient oriented to: self, place, time, and situation Is this baseline? Yes   Triage Complete: Triage complete  Chief Complaint Acute on chronic hypoxic respiratory failure (HCC) [J96.21]  Triage Note [name] from [place] [facility] c/o [facility] and increased bilateral leg swelling. Pt is normally on 2L [place], per staff he was sating in the 70's on 4L when EMS arrived he was in the 80's they bumped him up to 6L and now pt is 98% on 6L.    Allergies Allergies  Allergen Reactions   Pollen Extract-Tree Extract Other (See Comments)    "HEADACHES, TIRED, DRAINAGE FROM SINUSES"   Feraheme [Ferumoxytol] Other (See Comments)    Chest pain, facial flushing, shortness of breath   Molds & Smuts Other (See Comments)    Also dust mites causes sinus infections, h/a etc.    Level of Care/Admitting Diagnosis ED Disposition     ED Disposition  Admit   Condition  --   Comment  Hospital Area: [facility] [[id number]]  Level of Care: Progressive [102]  Admit to Progressive based on following criteria: CARDIOVASCULAR & THORACIC of moderate stability with acute coronary syndrome symptoms/low risk myocardial infarction/hypertensive urgency/arrhythmias/heart failure potentially compromising stability and stable post cardiovascular intervention patients.  May place patient in observation at [facility] or [facility] if equivalent level of care is available:: No  Covid Evaluation: Asymptomatic - no recent exposure (last 10 days) testing not required  Diagnosis: Acute on chronic hypoxic respiratory failure [facility]) [[id number]]  Admitting Physician: Dickie La [[id number]]  Attending Physician: Dickie La [[id number]]          B Medical/Surgery History Past  Medical History:  Diagnosis Date   Asthma    Barrett's esophagus    Benign prostatic hypertrophy    Bilateral lower extremity edema    CHF (congestive heart failure) (HCC)    Chronic fatigue    COPD (chronic obstructive pulmonary disease) (HCC)    Diabetes mellitus type 2 in nonobese (HCC) 04/24/2019   Diverticulosis of colon (without mention of hemorrhage)    Esophageal reflux    Gastritis    Gluten intolerance    Hiatal hernia 02/10/2011   Noted on CT Scan - Moderate Hiatal Hernia   Hyperlipemia    Hypertension    Lymphoma of lymph nodes in pelvis (HCC) 03/03/2011    Large Right Retroperitoneal Mass   Melanoma (HCC)    lymphoma   Microcytic anemia 04/20/2011   NHL (non-Hodgkin's 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 [address]ta [address]   Pulmonary hyperinflation    Renal cyst 02/10/2011    Noted on CT Scan - Bilateral Renal Cysts   S/P radiation therapy 03/15/11 - 04/09/11   Abdominal/ Pelvic Tumor, 3600 cGy/20 Fractions   Sleep apnea    Past Surgical History:  Procedure Laterality Date   ARTHROSCOPIC REPAIR ACL     right   BONE MARROW ASPIRATION  02/25/11   Bone Marrow, Aspirate, Clot, and Bilateral Bx, Right PIC   CATARACT EXTRACTION, BILATERAL     ESOPHAGOGASTRODUODENOSCOPY (EGD) WITH PROPOFOL N/A 10/19/2020  Procedure: ESOPHAGOGASTRODUODENOSCOPY (EGD) WITH PROPOFOL;  Surgeon: Imogene Burn, MD;  Location: [facility] ENDOSCOPY;  Service: Gastroenterology;  Laterality: N/A;   EYE SURGERY     HEMOSTASIS CLIP PLACEMENT  10/19/2020   Procedure: HEMOSTASIS CLIP PLACEMENT;  Surgeon: Imogene Burn, MD;  Location: [facility] ENDOSCOPY;  Service: Gastroenterology;;   HERNIA REPAIR     LEFT INGUINAL    INSERTION OF MESH N/A 08/23/2012   Procedure: INSERTION OF MESH;  Surgeon: Lodema Pilot, DO;  Location: [facility] ORS;   Service: General;  Laterality: N/A;   LOOP RECORDER INSERTION N/A 04/26/2019   Procedure: LOOP RECORDER INSERTION;  Surgeon: Marinus Maw, MD;  Location: [facility] INVASIVE CV LAB;  Service: Cardiovascular;  Laterality: N/A;   LUMBAR LAMINECTOMY/DECOMPRESSION MICRODISCECTOMY Right 12/31/2013   Procedure: RIGHT L4-5 L5-S1 LAMINECTOMY;  Surgeon: Barnett Abu, MD;  Location: [facility] NEURO ORS;  Service: Neurosurgery;  Laterality: Right;  RIGHT L4-5 L5-S1 LAMINECTOMY   POLYPECTOMY  10/19/2020   Procedure: POLYPECTOMY;  Surgeon: Imogene Burn, MD;  Location: [facility] ENDOSCOPY;  Service: Gastroenterology;;   RIGHT HEART CATH N/A 07/25/2018   Procedure: RIGHT HEART CATH;  Surgeon: Laurey Morale, MD;  Location: [facility] INVASIVE CV LAB;  Service: Cardiovascular;  Laterality: N/A;   RIGHT HEART CATH N/A 02/05/2022   Procedure: RIGHT HEART CATH;  Surgeon: Laurey Morale, MD;  Location: [facility] INVASIVE CV LAB;  Service: Cardiovascular;  Laterality: N/A;   ROTATOR CUFF REPAIR     right   TONSILLECTOMY     TONSILLECTOMY     VENTRAL HERNIA REPAIR N/A 08/23/2012   Procedure: HERNIA REPAIR VENTRAL ADULT;  Surgeon: Lodema Pilot, DO;  Location: [facility] ORS;  Service: General;  Laterality: N/A;     A IV Location/Drains/Wounds Patient Lines/Drains/Airways Status     Active Line/Drains/Airways     Name Placement date Placement time Site Days   Peripheral IV 08/11/2022 20 G Left;Posterior Forearm 08/21/2022  1457  Forearm  less than 1            Intake/Output Last 24 hours  Intake/Output Summary (Last 24 hours) at 08/05/2022 1756 Last data filed at 08/24/2022 1718 Gross per 24 hour  Intake --  Output 1400 ml  Net -1400 ml    Labs/Imaging Results for orders placed or performed during the hospital encounter of 08/19/2022 (from the past 48 hour(s))  SARS Coronavirus 2 by RT PCR (hospital order, performed in [facility] hospital lab) *cepheid single result test* Anterior Nasal Swab     Status: None   Collection Time: 08/06/2022 12:04  PM   Specimen: Anterior Nasal Swab  Result Value Ref Range   SARS Coronavirus 2 by RT PCR NEGATIVE NEGATIVE    Comment: Performed at [facility], [address], [place], [place] [address]  Brain natriuretic peptide     Status: Abnormal   Collection Time: 08/17/2022 12:31 PM  Result Value Ref Range   B Natriuretic Peptide 162.3 (H) 0.0 - 100.0 pg/mL    Comment: Performed at [facility], [address], [place], [place] [address]  I-Stat venous blood gas, [facility], [facility], [facility])     Status: Abnormal   Collection Time: 08/03/2022  1:10 PM  Result Value Ref Range   pH, Ven 7.385 7.25 - 7.43   pCO2, Ven 49.5 44 - 60 mmHg   pO2, Ven 71 (H) 32 - 45 mmHg   Bicarbonate 29.6 (H) 20.0 - 28.0 mmol/L   TCO2 31 22 - 32 mmol/L  O2 Saturation 94 %   Acid-Base Excess 4.0 (H) 0.0 - 2.0 mmol/L   Sodium 126 (L) 135 - 145 mmol/L   Potassium 3.4 (L) 3.5 - 5.1 mmol/L   Calcium, Ion 0.74 (LL) 1.15 - 1.40 mmol/L   HCT 28.0 (L) 39.0 - 52.0 %   Hemoglobin 9.5 (L) 13.0 - 17.0 g/dL   Sample type VENOUS    Comment NOTIFIED PHYSICIAN   CBC with Differential     Status: Abnormal   Collection Time: 08/23/2022  1:14 PM  Result Value Ref Range   WBC 7.7 4.0 - 10.5 K/uL   RBC 3.45 (L) 4.22 - 5.81 MIL/uL   Hemoglobin 8.5 (L) 13.0 - 17.0 g/dL   HCT 65.7 (L) 84.6 - 96.2 %   MCV 79.7 (L) 80.0 - 100.0 fL   MCH 24.6 (L) 26.0 - 34.0 pg   MCHC 30.9 30.0 - 36.0 g/dL   RDW 95.2 (H) 84.1 - 32.4 %   Platelets 295 150 - 400 K/uL   nRBC 0.0 0.0 - 0.2 %   Neutrophils Relative % 80 %   Neutro Abs 6.2 1.7 - 7.7 K/uL   Lymphocytes Relative 8 %   Lymphs Abs 0.6 (L) 0.7 - 4.0 K/uL   Monocytes Relative 9 %   Monocytes Absolute 0.7 0.1 - 1.0 K/uL   Eosinophils Relative 2 %   Eosinophils Absolute 0.2 0.0 - 0.5 K/uL   Basophils Relative 0 %   Basophils Absolute 0.0 0.0 - 0.1 K/uL   Immature Granulocytes 1 %   Abs Immature Granulocytes 0.04 0.00 - 0.07 K/uL    Comment: Performed at [facility], [address], [place], [place] [address]  Basic metabolic panel     Status: Abnormal   Collection Time: 08/05/2022  1:14 PM  Result Value Ref Range   Sodium 129 (L) 135 - 145 mmol/L   Potassium 3.5 3.5 - 5.1 mmol/L   Chloride 84 (L) 98 - 111 mmol/L   CO2 27 22 - 32 mmol/L   Glucose, Bld 100 (H) 70 - 99 mg/dL    Comment: Glucose reference range applies only to samples taken after fasting for at least 8 hours.   BUN 151 (H) 8 - 23 mg/dL   Creatinine, Ser 7.25 (H) 0.61 - 1.24 mg/dL   Calcium 6.2 (LL) 8.9 - 10.3 mg/dL    Comment: CRITICAL RESULT CALLED TO, READ BACK BY AND VERIFIED WITH B,BECK RN @1402  08/14/2022 E,BENTON   GFR, Estimated 23 (L) >60 mL/min    Comment: (NOTE) Calculated using the CKD-EPI Creatinine Equation (2021)    Anion gap 18 (H) 5 - 15    Comment: Performed at [facility], [address] [place], [place] [address]  D-dimer, quantitative     Status: Abnormal   Collection Time: 08/16/2022  1:15 PM  Result Value Ref Range   D-Dimer, Quant 0.94 (H) 0.00 - 0.50 ug/mL-FEU    Comment: (NOTE) At the manufacturer cut-off value of 0.5 g/mL FEU, this assay has a negative predictive value of 95-100%.This assay is intended for use in conjunction with a clinical pretest probability (PTP) assessment model to exclude pulmonary embolism (PE) and deep venous thrombosis (DVT) in outpatients suspected of PE or DVT. Results should be correlated with clinical presentation. Performed at [facility], [address] [place], [place] [address]    *Note: Due to a large number of results and/or encounters for the requested time period, some results have  not been displayed. A complete set of results can be found in Results Review.   DG Chest Portable 1 View  Result Date: 08/05/2022 CLINICAL DATA:  Shortness of breath.  Bilateral leg swelling. EXAM: PORTABLE CHEST 1 VIEW COMPARISON:  Chest radiographs 05/04/2022 FINDINGS: The cardiomediastinal silhouette is unchanged. A large hiatal hernia is  again seen with associated left basilar atelectasis. Lung volumes are decreased with pulmonary vascular congestion and diffusely increased interstitial markings bilaterally. No sizable pleural effusion or pneumothorax is identified. No acute osseous abnormality is seen. An implanted loop recorder is noted. IMPRESSION: 1. Low lung volumes with suspected interstitial edema. 2. Large hiatal hernia. Electronically Signed   By: Sebastian Ache M.D.   On: 08/02/2022 12:17    Pending Labs Unresulted Labs (From admission, onward)     Start     Ordered   08/23/22 0500  CBC  Tomorrow morning,   R        09/01/2022 1600   08/26/2022 2100  Basic metabolic panel  Every 6 hours,   R (with TIMED occurrences)      08/14/2022 1626            Vitals/Pain Today's Vitals   08/21/2022 1443 08/24/2022 1445 08/29/2022 1449 08/21/2022 1705  BP:  95/64  96/71  Pulse:  88  75  Resp:  16  20  Temp:   97.6 F (36.4 C)   TempSrc:   Oral   SpO2:  98%  100%  PainSc: 3        Isolation Precautions No active isolations  Medications Medications  midodrine (PROAMATINE) tablet 10 mg (10 mg Oral Given 08/21/2022 1716)  calcium gluconate 1 g/ 50 mL sodium chloride IVPB (1,000 mg Intravenous New Bag/Given 08/15/2022 1722)  acetaminophen (TYLENOL) tablet 650 mg (has no administration in time range)    Or  acetaminophen (TYLENOL) suppository 650 mg (has no administration in time range)  senna-docusate (Senokot-S) tablet 1 tablet (has no administration in time range)  furosemide (LASIX) 200 mg in dextrose 5 % 100 mL (2 mg/mL) infusion (4 mg/hr Intravenous New Bag/Given 09/01/2022 1722)  atorvastatin (LIPITOR) tablet 40 mg (40 mg Oral Given 08/16/2022 1716)  apixaban (ELIQUIS) tablet 2.5 mg (has no administration in time range)  gabapentin (NEURONTIN) capsule 100 mg (has no administration in time range)  oxyCODONE (Oxy IR/ROXICODONE) immediate release tablet 5 mg (has no administration in time range)  escitalopram (LEXAPRO) tablet 10 mg (has  no administration in time range)  Selexipag TABS 400 mcg (has no administration in time range)  Selexipag TABS 1,600 mcg (has no administration in time range)  Riociguat TABS 2.5 mg (has no administration in time range)  potassium chloride SA (KLOR-CON M) CR tablet 40 mEq (has no administration in time range)  ipratropium-albuterol (DUONEB) 0.5-2.5 (3) MG/3ML nebulizer solution 3 mL (3 mLs Nebulization Given 08/04/2022 1255)  oxyCODONE (Oxy IR/ROXICODONE) immediate release tablet 5 mg (5 mg Oral Given 08/29/2022 1250)  acetaminophen (TYLENOL) tablet 1,000 mg (1,000 mg Oral Given 08/03/2022 1251)    Mobility walks with device   Rollator walker  Focused Assessments Pulmonary Assessment Handoff:  Lung sounds: Bilateral Breath Sounds: Diminished, Clear O2 Device: Nasal Cannula O2 Flow Rate (L/min): 3 L/min (pt reports he has been wearing 4L of O2 lately and cannot tell me what his baseline liters of O2 is supposed to be.)    R Recommendations: See Admitting Provider Note  Report given to:   Additional Notes: Pt A&Ox4, ambulates w/ rollator walker.  Currently on 3L O2 unable to tell me what his baseline is supposed to be. 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 4+ pitting edema to bilateral lower extremities; lungs clear/diminished. Has lasix gtt infusing w/ calcium gluconate y-sited into it.

## 2022-08-22 NOTE — H&P (Cosign Needed Addendum)
Date: 08/21/2022               Patient Name:  Ryan [name] MRN: [id number]  DOB: May 31, 1931 Age / Sex: 89 y.o., male   PCP: Joycelyn Man, NP         Medical Service: Internal Medicine Teaching Service         Attending Physician: Dr. Dickie La, MD    First Contact: Dr. Morrie Sheldon Pager: [phone]  Second Contact: Dr. Elza Rafter Pager: [phone]       After Hours (After 5p/  First Contact Pager: [phone]  weekends / holidays): Second Contact Pager: [phone]   Chief Complaint: hypoxia  History of Present Illness: Ryan [name] is an 89 yo male with pulmonary hypertension on 2 L [place], dCHF (EF 65-70% with grade II diastolic dysfunction in Jan 2024), afib on eliquis with implanted cardiac device, RLS, who presents with worsening hypoxia.   Per his wife, the patient has not felt well over the last few weeks. He came to the ED a few weeks ago with hypotension and then was discharged. His O2 level dropped to 73% on 2 L [place] while at his facility and EMS was called.   The patient's wife states that he has been fatigued this past week and has not gone down to he dining room to get his evening meals, due to lack of energy. He has difficulty walking with his extreme leg swelling now, and also because his macular degeneration (it is advanced and he is 80% blind). Although is leg swelling is chronic, his wife states that his leg swelling is worse today than it was last week. She generally feels like his health has been declining over the last month. The patient states that he tried to go on a walk today, but was too fatigued and had to lay down because of his exhaustion.   He denies shortness of breath now and notes that it improved after getting a breathing treatment in the ED, but he feels like he is congested. He denies fevers, chills, cough, chest pain.  Lastly - the patient notes that he sleeps sitting up right and notes that he does not get restful sleep because of this. He  sleeps with multiple pillows, but now sleeps in a chair because of his chronic back pain, not because of orthopnea.    Meds:  Lipitor 40 mg  Eliquis 2.5 mg BID Lexapro 10 mg daily Finasteride 5 mg  Gabapentin 100 mg qhs Iron daily Metolazone 2.5 mg two times a week Metoprolol 12.5 mg XL at bedtime Midodrine 10 mg TID Multivitamin Omeprazole 20 mg BID Oxycodone 5 mg BID Riociguat 2.5 mg TID  Ropinirole 2 mg TID RLS  Selexipag? Torsemide 100 mg BID Trazodone at bedtime Uptravi 2000 mcg BID  Zofran  Buprenorphine 5 mcg/hr weekly  No outpatient medications have been marked as taking for the 08/09/2022 encounter [facility] Encounter).     Allergies: Allergies as of 08/13/2022 - Review Complete 08/09/2022  Allergen Reaction Noted   Pollen extract-tree extract Other (See Comments) 04/30/2011   Feraheme [ferumoxytol] Other (See Comments) 11/10/2020   Molds & smuts Other (See Comments) 02/17/2011   Past Medical History:  Diagnosis Date   Asthma    Barrett's esophagus    Benign prostatic hypertrophy    Bilateral lower extremity edema    CHF (congestive heart failure) (HCC)    Chronic fatigue    COPD (chronic obstructive pulmonary disease) (HCC)  Diabetes mellitus type 2 in nonobese (HCC) 04/24/2019   Diverticulosis of colon (without mention of hemorrhage)    Esophageal reflux    Gastritis    Gluten intolerance    Hiatal hernia 02/10/2011   Noted on CT Scan - Moderate Hiatal Hernia   Hyperlipemia    Hypertension    Lymphoma of lymph nodes in pelvis (HCC) 03/03/2011    Large Right Retroperitoneal Mass   Melanoma (HCC)    lymphoma   Microcytic anemia 04/20/2011   NHL (non-Hodgkin's 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 Loreta Ave   Pulmonary hyperinflation    Renal cyst 02/10/2011     Noted on CT Scan - Bilateral Renal Cysts   S/P radiation therapy 03/15/11 - 04/09/11   Abdominal/ Pelvic Tumor, 3600 cGy/20 Fractions   Sleep apnea     Social History:  Lives at [facility], stll married but wife does not stay there  Quit smoking 20 years ago, was a former pipe smoker. Occassional alcohol use. No other drug use.  Cardiologist: Dr. Shirlee Latch PCP: Lizabeth Leyden (at [facility] - works for [employer])  Review of Systems: A complete ROS was negative except as per HPI.   Physical Exam: Blood pressure 101/62, pulse 84, temperature (!) 97.4 F (36.3 C), temperature source Oral, resp. rate 19, SpO2 97%. General: Pleasant, chronically ill-appearing male laying in bed. No acute distress, but appears comfortable. Chest: Has buprenorphine patch on chest wall CV: Regular rate, irregular rhythm, No murmurs, rubs, or gallops. Significant pitting edema of bilateral lower extremities.  Pulmonary: Slightly increased work of breathing on nasal cannula. Crackles appreciated in lung bases.  Abdominal: Soft, nontender, nondistended. Normal bowel sounds. Extremities: Palpable radial and DP pulses. Normal ROM. Skin: Venous stasis dermatitis up to mid shins Neuro: A&Ox3. Moves all extremities. Normal sensation. No focal deficit. Psych: Normal mood and affect  CXR: personally reviewed my interpretation is low lung volumes with interstitial edema    Assessment & Plan by Problem:  Ryan [name] is an 89 yo male with pulmonary hypertension on 2 L [place], dCHF (EF 65-70% with grade II diastolic dysfunction in Jan 2024), afib on eliquis with implanted cardiac device, who presents with worsening hypoxia.   Acute on Chronic diastolic congestive heart failure  Shortness of breadth Prior history of chronic diastolic heart failure, follows with Dr. Shirlee Latch. BNP elevated to 162. On physical exam, has crackles in lung bases and significant edema in bilateral legs. He is short of breath,  requiring 3 - 4 L of oxygen to maintain his oxygen saturation.  He had a D-dimer that was slightly elevated by age-adjusted cut off but negative by years criteria. Chest x-ray shows low lung volumes with interstitial edema.  -Cardiology consulted, following recommendations. -IV Lasix drip -BMP at 9pm and then in AM  Acute on chronic Kidney Disease CKD stage IV, GFR is 23 and Cr is 2.63, baseline creatinine 2.28 - Monitor BMP - Lasix drip as above.   Atrial fibrillation w/ RVR A-fib stable. HR between 60-80, K 3.5. - Oral potassium 40 mg ( goal K>4) - Continue Eliquis.  - Metoprolol 12.5 mg.   Anemia of chronic disease Hb stable at 8.5 without any s/s of bleeding. - Daily CBC  Pulmonary hypertension Interstitial lung disease He follows with Dr. Shirlee Latch. He is not  reporting worsening cough, fever. He had chest x-ray, concerning for interstitial edema. - Continue Riociguat 2.5 mg TID. - Continue Selexipag. - Continue UPTRAVI.   Hypocalcemia.  Calcium 6.2. Hypocalcemia 2/2 to chronic kidney disease. - Continue calcium gluconate.  Chronic problems  Hyperlipidemia -Lipitor 40 mg . Depression Lexapro 10 mg.  Restless syndrome -Ropinorole to 2 mg TID  Chronic hypotension -Midodrine 10 mg TID  BPH -Finasteride 5 mg   GERD -Omeprazole 20 mg twice daily -Zofran  Insomnia -Trazodone at bedtime.   Low back pain Follows with pain med specialist.  -Buprenorphine 5 mcg/hr weekly patch -Gabapentin 100 mg 2 times daily.  Constipation -docusate.  Dispo: Admit patient to Inpatient with expected length of stay greater than 2 midnights.  Signed: Elza Rafter, DO Internal Medicine Resident, PGY-3 [facility]  Pager: [phone] 7:26 PM, 08/29/2022   **Please contact the on call pager after 5 pm and on weekends at [phone].**  After 5pm on weekdays and 1pm on weekends: On Call pager: [phone]

## 2022-08-22 NOTE — ED Triage Notes (Signed)
[name] from [place] at [facility] c/o [facility] and increased bilateral leg swelling. Pt is normally on 2L [place], per staff he was sating in the 70's on 4L when EMS arrived he was in the 80's they bumped him up to 6L and now pt is 98% on 6L.

## 2022-08-22 NOTE — Consult Note (Signed)
Cardiology Consultation   Patient ID: [name] MRN: [id number]; DOB: 05/10/1931  Admit date: 09/01/2022 Date of Consult: 08/29/2022  PCP:  Joycelyn Man, NP   [facility] Providers Cardiologist:  Nanetta Batty, MD  Advanced Heart Failure:  Marca Ancona, MD       Patient Profile:   Ryan [name] is a 89 y.o. male with a hx of acute on chronic diastolic heart failure  who is being seen 08/26/2022 for the evaluation of anasarca at the request of Dr. Sol Blazing.  History of Present Illness:   Mr. Drewes has a h/o chronic diastolic CHF and stage 4 renal insufficiency. He underwent right heart cath in January 2024 where he was found to have mild pulmonary HTN, predominantly pulmonary venous HTN, and preserved cardiac output. He lives in a facility and admits to sodium indiscretion. The patient has had gradually worsening peripheral edema but then developed dyspnea and presents for additional evaluation.    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 Loreta Ave   Pulmonary hyperinflation     Renal cyst 02/10/2011    Noted on CT Scan - Bilateral Renal Cysts   S/P radiation therapy 03/15/11 - 04/09/11   Abdominal/ Pelvic Tumor, 3600 cGy/20 Fractions   Sleep apnea     Past Surgical History:  Procedure Laterality Date   ARTHROSCOPIC REPAIR ACL     right   BONE MARROW ASPIRATION  02/25/11   Bone Marrow, Aspirate, Clot, and Bilateral Bx, Right PIC   CATARACT EXTRACTION, BILATERAL     ESOPHAGOGASTRODUODENOSCOPY (EGD) WITH PROPOFOL N/A 10/19/2020   Procedure: ESOPHAGOGASTRODUODENOSCOPY (EGD) WITH PROPOFOL;  Surgeon: Imogene Burn, MD;  Location: [facility] ENDOSCOPY;  Service: Gastroenterology;  Laterality: N/A;   EYE SURGERY     HEMOSTASIS CLIP PLACEMENT  10/19/2020   Procedure: HEMOSTASIS CLIP PLACEMENT;  Surgeon: Imogene Burn, MD;  Location: [facility] ENDOSCOPY;  Service: Gastroenterology;;   HERNIA REPAIR     LEFT INGUINAL    INSERTION OF MESH N/A 08/23/2012   Procedure: INSERTION OF MESH;  Surgeon: Lodema Pilot, DO;  Location: [facility] ORS;  Service: General;  Laterality: N/A;   LOOP RECORDER INSERTION N/A 04/26/2019   Procedure: LOOP RECORDER INSERTION;  Surgeon: Marinus Maw, MD;  Location: [facility] INVASIVE CV LAB;  Service: Cardiovascular;  Laterality: N/A;   LUMBAR LAMINECTOMY/DECOMPRESSION MICRODISCECTOMY Right 12/31/2013   Procedure: RIGHT L4-5 L5-S1 LAMINECTOMY;  Surgeon: Barnett Abu, MD;  Location: [facility] NEURO ORS;  Service: Neurosurgery;  Laterality: Right;  RIGHT L4-5 L5-S1 LAMINECTOMY   POLYPECTOMY  10/19/2020   Procedure: POLYPECTOMY;  Surgeon: Imogene Burn, MD;  Location: [facility] ENDOSCOPY;  Service: Gastroenterology;;   RIGHT HEART CATH N/A 07/25/2018   Procedure: RIGHT HEART CATH;  Surgeon: Laurey Morale, MD;  Location: [facility] INVASIVE CV LAB;  Service: Cardiovascular;  Laterality: N/A;   RIGHT HEART CATH N/A 02/05/2022   Procedure: RIGHT HEART CATH;  Surgeon: Laurey Morale, MD;  Location[facility] INVASIVE CV LAB;  Service: Cardiovascular;  Laterality: N/A;   ROTATOR CUFF REPAIR     right    TONSILLECTOMY     TONSILLECTOMY     VENTRAL HERNIA REPAIR N/A 08/23/2012   Procedure: HERNIA REPAIR VENTRAL ADULT;  Surgeon: Lodema Pilot, DO;  Location: [facility] ORS;  Service: General;  Laterality: N/A;     Home Medications:  Prior to Admission medications   Medication Sig Start Date End Date Taking? Authorizing Provider  acetaminophen (TYLENOL) 500 MG tablet Take 500 mg by mouth every 6 (six) hours as needed for mild pain or moderate pain.    [provider]  atorvastatin (LIPITOR) 40 MG tablet Take 1 tablet (40 mg total) by mouth daily at 6 PM. 04/27/19   Kathlen Mody, MD  cyanocobalamin (,VITAMIN B-12,) 1000 MCG/ML injection Inject 1,000 mcg into the muscle every 30 (thirty) days.    [provider]  ELIQUIS 2.5 MG TABS tablet TAKE ONE TABLET TWICE DAILY 07/29/22   Laurey Morale, MD  escitalopram (LEXAPRO) 10 MG tablet Take 10 mg by mouth in the morning.    [provider]  finasteride (PROSCAR) 5 MG tablet Take 5 mg by mouth every evening.    [provider]  FLONASE SENSIMIST 27.5 MCG/SPRAY nasal spray Place 1 spray into the nose daily as needed for rhinitis or allergies.  12/11/18   [provider]  gabapentin (NEURONTIN) 100 MG capsule Take 1 capsule (100 mg total) by mouth 2 (two) times daily. Patient taking differently: Patient takes 2 tablets by mouth at bedtime. 06/03/22   Butch Penny, NP  guaiFENesin (MUCINEX) 600 MG 12 hr tablet Take 600 mg by mouth in the morning.    [provider]  Iron, Ferrous Sulfate, 325 (65 Fe) MG TABS Take 325 mg by mouth daily. 12/31/20   Milford, Anderson Malta, FNP  Loperamide HCl (IMODIUM PO) Take 2 tablets by mouth daily.    [provider]  metolazone (ZAROXOLYN) 2.5 MG tablet Take 1 tablet (2.5 mg total) by mouth 2 (two) times a week. 08/02/22   Alen Bleacher, NP  metoprolol succinate (TOPROL-XL) 25 MG 24 hr tablet TAKE 1/2 TABLET AT BEDTIME 05/19/22   Laurey Morale, MD  midodrine (PROAMATINE)  5 MG tablet Take 2 tablets (10 mg total) by mouth 3 (three) times daily with meals. 08/02/22   Alen Bleacher, NP  Multiple Vitamins-Minerals (PRESERVISION AREDS 2) CAPS Take 1 capsule by mouth 2 (two) times daily.    [provider]  omeprazole (PRILOSEC) 20 MG capsule Take 1 capsule (20 mg total) by mouth 2 (two) times daily before a meal. 09/16/21   Imogene Burn, MD  ondansetron (ZOFRAN-ODT) 4 MG disintegrating tablet Take 1 tablet (4 mg total) by mouth every 8 (eight) hours as needed for nausea or vomiting. 02/08/22   Ghimire, Werner Lean, MD  oxyCODONE (OXY IR/ROXICODONE) 5 MG immediate release tablet Take 5 mg by mouth 2 (two) times daily as needed. 06/14/22   [provider]  OXYGEN Inhale 2 L/min into the lungs. As needed and at night    [provider]  potassium chloride SA (KLOR-CON M) 20 MEQ tablet Take 4 tablets (80 mEq total) by mouth in the morning AND 2 tablets (40 mEq total) every evening. Patient taking differently: Take 4 tablets (80 mEq total) by mouth in the morning AND 2 tablets (40 mEq total) every evening. Take a extra tablet on Tuesday and Friday. 03/04/22   Laurey Morale, MD  Riociguat (ADEMPAS) 2.5 MG TABS Take 2.5 mg by mouth 3 (three) times daily. 07/22/22   Laurey Morale, MD  rOPINIRole (REQUIP) 2 MG tablet TAKE ONE TABLET THREE TIMES DAILY 05/10/22   Butch Penny, NP  Selexipag (UPTRAVI) 1600 MCG TABS Take 1,600 mcg by mouth 2 (two) times daily. 0800 and 1900    [provider]  Selexipag 400 MCG TABS Take 400 mcg by mouth 2 (two) times daily. 0800 and 1900    [provider]  torsemide (DEMADEX) 100 MG tablet TAKE ONE TABLET TWICE DAILY 09/24/21   Jacklynn Ganong, FNP  TRAZODONE HCL ER PO Take 1 tablet by mouth at bedtime.    [provider]    Inpatient Medications: Scheduled Meds:  apixaban  2.5 mg Oral BID   atorvastatin  40 mg Oral q1800   [START ON 08/23/2022] escitalopram  10 mg Oral q AM   gabapentin  100  mg Oral BID   midodrine  10 mg Oral TID WC   Continuous Infusions:  calcium gluconate     furosemide (LASIX) 200 mg in dextrose 5 % 100 mL (2 mg/mL) infusion     PRN Meds: acetaminophen **OR** acetaminophen, oxyCODONE, senna-docusate  Allergies:    Allergies  Allergen Reactions   Pollen Extract-Tree Extract Other (See Comments)    "HEADACHES, TIRED, DRAINAGE FROM SINUSES"   Feraheme [Ferumoxytol] Other (See Comments)    Chest pain, facial flushing, shortness of breath   Molds & Smuts Other (See Comments)    Also dust mites causes sinus infections, h/a etc.    Social History:   Social History   Socioeconomic History   Marital status: Married    Spouse name: Not on file   Number of children: 2   Years of education: Not on file   Highest education level: Not on file  Occupational History   Occupation: retired    Comment: [employer]  Tobacco Use   Smoking status: Former    Current packs/day: 0.00    Average packs/day: 1 pack/day for 35.0 years (35.0 ttl pk-yrs)    Types: Pipe, Cigarettes    Start date: 02/22/1957    Quit date: 02/23/1992    Years since quitting: 30.5   Smokeless tobacco: Never   Tobacco comments:    quit 20 years ago  Vaping Use   Vaping status: Never Used  Substance and Sexual Activity   Alcohol use: Not Currently    Comment: 2 drinks daily scotch  AND WINE WITH SUPPER   Drug use: No   Sexual activity: Not on file  Other Topics Concern   Not on file  Social History Narrative   Married - second marriage   He has two children   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   Currently teaches pottery making, 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[place]   Former Smoker quit 12 years ago- smoked for 35 years   Alcohol use- not currently   Right handed   Social Determinants of  Health   Financial Resource Strain: Low Risk  (02/08/2022)   Overall Financial Resource Strain (CARDIA)    Difficulty of Paying Living Expenses: Not hard at all  Food Insecurity:  No Food Insecurity (02/08/2022)   Hunger Vital Sign    Worried About Running Out of Food in the Last Year: Never true    Ran Out of Food in the Last Year: Never true  Transportation Needs: No Transportation Needs (02/08/2022)   PRAPARE - Administrator, Civil Service (Medical): No    Lack of Transportation (Non-Medical): No  Physical Activity: Not on file  Stress: Not on file  Social Connections: Unknown (06/09/2021)   [facility] from [facility], [facility]   Social Network    Social Network: Not on file  Intimate Partner Violence: Not At Risk (02/08/2022)   Humiliation, Afraid, Rape, and Kick questionnaire    Fear of Current or Ex-Partner: No    Emotionally Abused: No    Physically Abused: No    Sexually Abused: No    Family History:    Family History  Problem Relation Age of Onset   Dementia Mother    Heart disease Father        MI 44   Urticaria Father    Restless legs syndrome Father    Dementia Sister    Parkinson's disease Sister    ALS Sister    Prostate cancer Brother    Prostate cancer Paternal Uncle    Prostate cancer Paternal Uncle    Prostate cancer Paternal Uncle    Hyperlipidemia Other    Stroke Other    Hypertension Other    ADD / ADHD Other    Colon cancer Neg Hx    Allergic rhinitis Neg Hx    Asthma Neg Hx    Eczema Neg Hx      ROS:  Please see the history of present illness.   All other ROS reviewed and negative.     Physical Exam/Data:   Vitals:   08/03/2022 1425 08/14/2022 1442 08/29/2022 1445 08/24/2022 1449  BP: (!) 86/51 (!) 94/54 95/64   Pulse: (!) 35 68 88   Resp: (!) 25 20 16    Temp:    97.6 F (36.4 C)  TempSrc:    Oral  SpO2: 97% 94% 98%    No intake or output data in the 24 hours ending 08/05/2022 1634    08/13/2022   11:43 AM 06/14/2022    2:18 PM 06/03/2022   10:53 AM  Last 3 Weights  Weight (lbs) 188 lb 185 lb 6.4 oz 183 lb  Weight (kg) 85.276 kg 84.097 kg 83.008 kg     There is no height or weight on file to calculate  BMI.  General:  chronically ill appearing, NAD HEENT: normal Neck: no JVD Vascular: No carotid bruits; Distal pulses 2+ bilaterally Cardiac:  normal S1, S2; IRIRR; no murmur  Lungs:  clear to auscultation bilaterally, no wheezing, rhonchi or rales  Abd: soft, nontender, no hepatomegaly  Ext: no edema Musculoskeletal:  No deformities, BUE and BLE strength normal and equal Skin: warm and dry  Neuro:  CNs 2-12 intact, no focal abnormalities noted Psych:  Normal affect   EKG:  The EKG was personally reviewed and demonstrates:  atrial fib with a controlled VR Telemetry:  Telemetry was personally reviewed and demonstrates:  atrial fib with a controlled VR  Relevant CV Studies: none  Laboratory Data:  High Sensitivity Troponin:  No results for input(s): "  TROPONINIHS" in the last 720 hours.   Chemistry Recent Labs  Lab 08/08/2022 1310 08/25/2022 1314  NA 126* 129*  K 3.4* 3.5  CL  --  84*  CO2  --  27  GLUCOSE  --  100*  BUN  --  151*  CREATININE  --  2.63*  CALCIUM  --  6.2*  GFRNONAA  --  23*  ANIONGAP  --  18*    No results for input(s): "PROT", "ALBUMIN", "AST", "ALT", "ALKPHOS", "BILITOT" in the last 168 hours. Lipids No results for input(s): "CHOL", "TRIG", "HDL", "LABVLDL", "LDLCALC", "CHOLHDL" in the last 168 hours.  Hematology Recent Labs  Lab 08/25/2022 1310 08/26/2022 1314  WBC  --  7.7  RBC  --  3.45*  HGB 9.5* 8.5*  HCT 28.0* 27.5*  MCV  --  79.7*  MCH  --  24.6*  MCHC  --  30.9  RDW  --  16.5*  PLT  --  295   Thyroid No results for input(s): "TSH", "FREET4" in the last 168 hours.  BNP Recent Labs  Lab 08/10/2022 1231  BNP 162.3*    DDimer  Recent Labs  Lab 08/16/2022 1315  DDIMER 0.94*     Radiology/Studies:  DG Chest Portable 1 View  Result Date: 08/17/2022 CLINICAL DATA:  Shortness of breath.  Bilateral leg swelling. EXAM: PORTABLE CHEST 1 VIEW COMPARISON:  Chest radiographs 05/04/2022 FINDINGS: The cardiomediastinal silhouette is unchanged. A  large hiatal hernia is again seen with associated left basilar atelectasis. Lung volumes are decreased with pulmonary vascular congestion and diffusely increased interstitial markings bilaterally. No sizable pleural effusion or pneumothorax is identified. No acute osseous abnormality is seen. An implanted loop recorder is noted. IMPRESSION: 1. Low lung volumes with suspected interstitial edema. 2. Large hiatal hernia. Electronically Signed   By: Sebastian Ache M.D.   On: 08/19/2022 12:17     Assessment and Plan:   Acute on chronic diastolic heart failure with anasarca - Discussed with medical attending. Agree with initiation of IV lasix drip. He may also require demadex but we will have Dr. Alford Highland service take over tomorrow. Chronic stage 4 renal failure -follow renal function. I wonder if he will need CVVH to help get the fluid off. Atrial fib - his VR is well controlled. Continue eliquis.    Risk Assessment/Risk Scores:        For questions or updates, please contact [facility] Please consult [email or web address] for contact info under    Signed, Lewayne Bunting, MD  08/21/2022 4:34 PM

## 2022-08-23 ENCOUNTER — Observation Stay (HOSPITAL_COMMUNITY): Payer: Medicare HMO

## 2022-08-23 DIAGNOSIS — I9589 Other hypotension: Secondary | ICD-10-CM | POA: Diagnosis present

## 2022-08-23 DIAGNOSIS — I959 Hypotension, unspecified: Secondary | ICD-10-CM | POA: Diagnosis not present

## 2022-08-23 DIAGNOSIS — J9621 Acute and chronic respiratory failure with hypoxia: Secondary | ICD-10-CM | POA: Diagnosis present

## 2022-08-23 DIAGNOSIS — I130 Hypertensive heart and chronic kidney disease with heart failure and stage 1 through stage 4 chronic kidney disease, or unspecified chronic kidney disease: Secondary | ICD-10-CM | POA: Diagnosis present

## 2022-08-23 DIAGNOSIS — Z515 Encounter for palliative care: Secondary | ICD-10-CM | POA: Diagnosis not present

## 2022-08-23 DIAGNOSIS — I4719 Other supraventricular tachycardia: Secondary | ICD-10-CM | POA: Diagnosis present

## 2022-08-23 DIAGNOSIS — F32A Depression, unspecified: Secondary | ICD-10-CM | POA: Diagnosis present

## 2022-08-23 DIAGNOSIS — I480 Paroxysmal atrial fibrillation: Secondary | ICD-10-CM | POA: Diagnosis present

## 2022-08-23 DIAGNOSIS — G2581 Restless legs syndrome: Secondary | ICD-10-CM | POA: Diagnosis present

## 2022-08-23 DIAGNOSIS — Z87891 Personal history of nicotine dependence: Secondary | ICD-10-CM

## 2022-08-23 DIAGNOSIS — E1122 Type 2 diabetes mellitus with diabetic chronic kidney disease: Secondary | ICD-10-CM | POA: Diagnosis present

## 2022-08-23 DIAGNOSIS — Z1152 Encounter for screening for COVID-19: Secondary | ICD-10-CM | POA: Diagnosis not present

## 2022-08-23 DIAGNOSIS — I5033 Acute on chronic diastolic (congestive) heart failure: Secondary | ICD-10-CM | POA: Diagnosis present

## 2022-08-23 DIAGNOSIS — N184 Chronic kidney disease, stage 4 (severe): Secondary | ICD-10-CM | POA: Diagnosis present

## 2022-08-23 DIAGNOSIS — E871 Hypo-osmolality and hyponatremia: Secondary | ICD-10-CM | POA: Diagnosis present

## 2022-08-23 DIAGNOSIS — J4489 Other specified chronic obstructive pulmonary disease: Secondary | ICD-10-CM | POA: Diagnosis present

## 2022-08-23 DIAGNOSIS — J849 Interstitial pulmonary disease, unspecified: Secondary | ICD-10-CM | POA: Diagnosis present

## 2022-08-23 DIAGNOSIS — I270 Primary pulmonary hypertension: Secondary | ICD-10-CM | POA: Diagnosis not present

## 2022-08-23 DIAGNOSIS — I5084 End stage heart failure: Secondary | ICD-10-CM | POA: Diagnosis present

## 2022-08-23 DIAGNOSIS — Z7189 Other specified counseling: Secondary | ICD-10-CM | POA: Diagnosis not present

## 2022-08-23 DIAGNOSIS — E785 Hyperlipidemia, unspecified: Secondary | ICD-10-CM | POA: Diagnosis present

## 2022-08-23 DIAGNOSIS — Z66 Do not resuscitate: Secondary | ICD-10-CM | POA: Diagnosis present

## 2022-08-23 DIAGNOSIS — D631 Anemia in chronic kidney disease: Secondary | ICD-10-CM | POA: Diagnosis present

## 2022-08-23 DIAGNOSIS — N179 Acute kidney failure, unspecified: Secondary | ICD-10-CM | POA: Diagnosis present

## 2022-08-23 DIAGNOSIS — E876 Hypokalemia: Secondary | ICD-10-CM | POA: Diagnosis present

## 2022-08-23 DIAGNOSIS — I272 Pulmonary hypertension, unspecified: Secondary | ICD-10-CM | POA: Diagnosis present

## 2022-08-23 LAB — CBC
HCT: 27.7 % — ABNORMAL LOW (ref 39.0–52.0)
Hemoglobin: 8.6 g/dL — ABNORMAL LOW (ref 13.0–17.0)
MCH: 25.1 pg — ABNORMAL LOW (ref 26.0–34.0)
MCHC: 31 g/dL (ref 30.0–36.0)
MCV: 81 fL (ref 80.0–100.0)
Platelets: 332 10*3/uL (ref 150–400)
RBC: 3.42 MIL/uL — ABNORMAL LOW (ref 4.22–5.81)
RDW: 16.2 % — ABNORMAL HIGH (ref 11.5–15.5)
WBC: 7.8 10*3/uL (ref 4.0–10.5)
nRBC: 0 % (ref 0.0–0.2)

## 2022-08-23 LAB — BASIC METABOLIC PANEL
Anion gap: 15 (ref 5–15)
Anion gap: 14 (ref 5–15)
Anion gap: 13 (ref 5–15)
Anion gap: 15 (ref 5–15)
BUN: 136 mg/dL — ABNORMAL HIGH (ref 8–23)
BUN: 129 mg/dL — ABNORMAL HIGH (ref 8–23)
BUN: 123 mg/dL — ABNORMAL HIGH (ref 8–23)
BUN: 116 mg/dL — ABNORMAL HIGH (ref 8–23)
CO2: 32 mmol/L (ref 22–32)
CO2: 34 mmol/L — ABNORMAL HIGH (ref 22–32)
CO2: 34 mmol/L — ABNORMAL HIGH (ref 22–32)
CO2: 34 mmol/L — ABNORMAL HIGH (ref 22–32)
Calcium: 6.3 mg/dL — CL (ref 8.9–10.3)
Calcium: 6.3 mg/dL — CL (ref 8.9–10.3)
Calcium: 6.5 mg/dL — ABNORMAL LOW (ref 8.9–10.3)
Calcium: 6.9 mg/dL — ABNORMAL LOW (ref 8.9–10.3)
Chloride: 84 mmol/L — ABNORMAL LOW (ref 98–111)
Chloride: 84 mmol/L — ABNORMAL LOW (ref 98–111)
Chloride: 83 mmol/L — ABNORMAL LOW (ref 98–111)
Chloride: 84 mmol/L — ABNORMAL LOW (ref 98–111)
Creatinine, Ser: 2.19 mg/dL — ABNORMAL HIGH (ref 0.61–1.24)
Creatinine, Ser: 2.12 mg/dL — ABNORMAL HIGH (ref 0.61–1.24)
Creatinine, Ser: 2.04 mg/dL — ABNORMAL HIGH (ref 0.61–1.24)
Creatinine, Ser: 1.89 mg/dL — ABNORMAL HIGH (ref 0.61–1.24)
GFR, Estimated: 28 mL/min — ABNORMAL LOW (ref >60)
GFR, Estimated: 29 mL/min — ABNORMAL LOW (ref >60)
GFR, Estimated: 31 mL/min — ABNORMAL LOW (ref >60)
GFR, Estimated: 34 mL/min — ABNORMAL LOW (ref >60)
Glucose, Bld: 89 mg/dL (ref 70–99)
Glucose, Bld: 96 mg/dL (ref 70–99)
Glucose, Bld: 133 mg/dL — ABNORMAL HIGH (ref 70–99)
Glucose, Bld: 133 mg/dL — ABNORMAL HIGH (ref 70–99)
Potassium: 2.3 mmol/L — CL (ref 3.5–5.1)
Potassium: 2.3 mmol/L — CL (ref 3.5–5.1)
Potassium: 3.2 mmol/L — ABNORMAL LOW (ref 3.5–5.1)
Potassium: 3.5 mmol/L (ref 3.5–5.1)
Sodium: 131 mmol/L — ABNORMAL LOW (ref 135–145)
Sodium: 132 mmol/L — ABNORMAL LOW (ref 135–145)
Sodium: 130 mmol/L — ABNORMAL LOW (ref 135–145)
Sodium: 133 mmol/L — ABNORMAL LOW (ref 135–145)

## 2022-08-23 LAB — HEPATIC FUNCTION PANEL
ALT: 10 U/L (ref 0–44)
AST: 10 U/L — ABNORMAL LOW (ref 15–41)
Albumin: 3.1 g/dL — ABNORMAL LOW (ref 3.5–5.0)
Alkaline Phosphatase: 93 U/L (ref 38–126)
Bilirubin, Direct: <0.1 mg/dL (ref 0.0–0.2)
Total Bilirubin: 0.4 mg/dL (ref 0.3–1.2)
Total Protein: 6.6 g/dL (ref 6.5–8.1)

## 2022-08-23 LAB — MAGNESIUM: Magnesium: 1.6 mg/dL — ABNORMAL LOW (ref 1.7–2.4)

## 2022-08-23 LAB — LACTIC ACID, PLASMA: Lactic Acid, Venous: 0.7 mmol/L (ref 0.5–1.9)

## 2022-08-23 LAB — PHOSPHORUS: Phosphorus: 4.5 mg/dL (ref 2.5–4.6)

## 2022-08-23 LAB — VITAMIN D 25 HYDROXY (VIT D DEFICIENCY, FRACTURES): Vit D, 25-Hydroxy: 30.12 ng/mL (ref 30–100)

## 2022-08-23 MED ORDER — POTASSIUM CHLORIDE 10 MEQ/100ML IV SOLN
10.0000 meq | INTRAVENOUS | Status: AC
  Administered 2022-08-23 (×6): 10 meq via INTRAVENOUS
  Filled 2022-08-23 (×2): qty 100

## 2022-08-23 MED ORDER — POTASSIUM CHLORIDE CRYS ER 20 MEQ PO TBCR
40.0000 meq | EXTENDED_RELEASE_TABLET | Freq: Once | ORAL | Status: AC
  Administered 2022-08-23: 40 meq via ORAL
  Filled 2022-08-23: qty 2

## 2022-08-23 MED ORDER — POTASSIUM CHLORIDE CRYS ER 20 MEQ PO TBCR
40.0000 meq | EXTENDED_RELEASE_TABLET | ORAL | Status: AC
  Administered 2022-08-23 (×2): 40 meq via ORAL
  Filled 2022-08-23 (×2): qty 2

## 2022-08-23 MED ORDER — CALCIUM GLUCONATE-NACL 1-0.675 GM/50ML-% IV SOLN
1.0000 g | Freq: Once | INTRAVENOUS | Status: AC
  Administered 2022-08-23: 1000 mg via INTRAVENOUS
  Filled 2022-08-23: qty 50

## 2022-08-23 MED ORDER — HYDROXYZINE HCL 10 MG PO TABS
10.0000 mg | ORAL_TABLET | Freq: Three times a day (TID) | ORAL | Status: DC | PRN
  Administered 2022-08-23 – 2022-08-25 (×3): 10 mg via ORAL
  Filled 2022-08-23 (×6): qty 1

## 2022-08-23 MED ORDER — CALCIUM CARBONATE 1250 (500 CA) MG PO TABS: 1.0000 | ORAL_TABLET | Freq: Two times a day (BID) | ORAL | Status: DC

## 2022-08-23 MED ORDER — FUROSEMIDE 10 MG/ML IJ SOLN
4.0000 mg/h | INTRAVENOUS | Status: DC
  Administered 2022-08-23 – 2022-08-25 (×2): 4 mg/h via INTRAVENOUS
  Filled 2022-08-23 (×2): qty 20

## 2022-08-23 MED ORDER — MENTHOL 3 MG MT LOZG
1.0000 | LOZENGE | OROMUCOSAL | Status: DC | PRN
  Administered 2022-08-23: 3 mg via ORAL
  Filled 2022-08-23: qty 9

## 2022-08-23 MED ORDER — BUPRENORPHINE 5 MCG/HR TD PTWK
1.0000 | MEDICATED_PATCH | TRANSDERMAL | Status: DC
  Administered 2022-08-23: 1 via TRANSDERMAL

## 2022-08-23 MED ORDER — MAGNESIUM SULFATE 4 GM/100ML IV SOLN
4.0000 g | Freq: Once | INTRAVENOUS | Status: AC
  Administered 2022-08-23: 4 g via INTRAVENOUS
  Filled 2022-08-23: qty 100

## 2022-08-23 MED ORDER — CALCIUM CARBONATE 1250 (500 CA) MG PO TABS
1.0000 | ORAL_TABLET | Freq: Three times a day (TID) | ORAL | Status: DC
  Administered 2022-08-23 – 2022-08-27 (×12): 1250 mg via ORAL
  Filled 2022-08-23 (×12): qty 1

## 2022-08-23 NOTE — Plan of Care (Signed)

## 2022-08-23 NOTE — TOC Initial Note (Signed)
Transition of Care [facility]) - Initial/Assessment Note    Patient Details  Name: Ryan [name] MRN: [id number] Date of Birth: [birth year]  [facility]) CM/SW Contact:    Deatra Robinson, [place] Phone Number: 08/23/2022, 2:40 PM  Clinical Narrative:  Pt admitted from [place] at [facility] ALF and plans to return at dc. Voicemail left for pt's RN/Amber at [facility] requesting return call to confirm pt's status and ability to return. SW will follow.   Dellie Burns, MSW, LCSW [phone] (coverage)                   Expected Discharge Plan: Assisted Living Barriers to Discharge: Continued Medical Work up   Patient Goals and CMS Choice            Expected Discharge Plan and Services       Living arrangements for the past 2 months: Assisted Living Facility                                      Prior Living Arrangements/Services Living arrangements for the past 2 months: Assisted Living Facility Lives with:: Facility Resident          Need for Family Participation in Patient Care: Yes (Comment) Care giver support system in place?: Yes (comment)   Criminal Activity/Legal Involvement Pertinent to Current Situation/Hospitalization: No - Comment as needed  Activities of Daily Living Home Assistive Devices/Equipment: Eyeglasses, Other (Comment) ADL Screening (condition at time of admission) Patient's cognitive ability adequate to safely complete daily activities?: Yes Is the patient deaf or have difficulty hearing?: Yes Does the patient have difficulty seeing, even when wearing glasses/contacts?: Yes Does the patient have difficulty concentrating, remembering, or making decisions?: No Patient able to express need for assistance with ADLs?: Yes Does the patient have difficulty dressing or bathing?: Yes Independently performs ADLs?: No Communication: Appropriate for developmental age Dressing (OT): Appropriate for developmental  age Grooming: Appropriate for developmental age Feeding: Appropriate for developmental age Bathing: Appropriate for developmental age Toileting: Appropriate for developmental age In/Out Bed: Appropriate for developmental age 76 in Home: Appropriate for developmental age Does the patient have difficulty walking or climbing stairs?: Yes Weakness of Legs: Both Weakness of Arms/Hands: None  Permission Sought/Granted                  Emotional Assessment       Orientation: : Oriented to Self, Oriented to Place, Oriented to  Time, Oriented to Situation Alcohol / Substance Use: Not Applicable Psych Involvement: No (comment)  Admission diagnosis:  Acute on chronic diastolic congestive heart failure (HCC) [I50.33] Acute on chronic respiratory failure with hypoxia (HCC) [J96.21] Hypotension, unspecified hypotension type [I95.9] Acute on chronic hypoxic respiratory failure (HCC) [J96.21] Patient Active Problem List   Diagnosis Date Noted   Hypocalcemia 08/23/2022   Acute on chronic hypoxic respiratory failure (HCC) 08/07/2022   Adjustment disorder with mixed anxiety and depressed mood 08/13/2022   Acute on chronic diastolic (congestive) heart failure (HCC) 02/03/2022   Acute on chronic respiratory failure with hypoxia (HCC) 02/03/2022   Urinary tract infection 02/03/2022   Cellulitis of leg 02/03/2022   Chronic hypotension 02/03/2022   Paroxysmal atrial fibrillation (HCC) 02/03/2022   History of non-Hodgkin's lymphoma 02/03/2022   Hypochromic anemia    GI bleed 10/17/2020   B12 deficiency 05/28/2019   Cerebrovascular accident (CVA) due to embolism of precerebral artery (HCC) 05/28/2019  Diabetic polyneuropathy associated with type 2 diabetes mellitus (HCC) 05/28/2019   Acute CVA (cerebrovascular accident) (HCC) 04/24/2019   Diabetes mellitus type 2 in nonobese (HCC) 04/24/2019   Acute respiratory failure with hypoxia and hypercarbia (HCC) 09/20/2018   CKD (chronic kidney  disease) stage 3, GFR 30-59 ml/min (HCC) 09/20/2018   Lethargy 09/20/2018   Acute metabolic encephalopathy 09/20/2018   Chronic diastolic CHF (congestive heart failure) (HCC) 09/20/2018   Acute kidney injury superimposed on chronic kidney disease (HCC) 09/10/2018   Pulmonary hypertension, unspecified (HCC) 09/10/2018   Multifocal atrial tachycardia 09/10/2018   CHF exacerbation (HCC) 09/07/2018   Leg pain 09/07/2018   Hyperkalemia 07/26/2018   Bilateral lower extremity edema 02/02/2017   Pulmonary fibrosis (HCC) 11/22/2016   Blurry vision, bilateral 10/26/2016   COPD (HCC) 07/05/2016   Exertional dyspnea 09/10/2015   Fatigue/malaise 08/19/2015   Asthma/COPD 02/12/2015   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   Chronic diastolic heart failure (HCC) 01/06/2014   Hypoxemia 01/05/2014   Essential hypertension 01/05/2014   Hypokalemia 01/05/2014   Urinary retention 01/05/2014   Hypoxia    Lumbar radiculopathy 12/31/2013   RLS (restless legs syndrome) 09/21/2012   Chronic bilateral low back pain with bilateral sciatica 06/15/2012   Abdominal pain, unspecified site 05/19/2012   Unspecified deficiency anemia 04/07/2012   Melanoma (HCC)    Non Hodgkin's lymphoma (HCC)    Periaortic lymphadenopathy 02/16/2011   Barrett's esophagus 07/09/2010   Personal history of colonic polyps 05/28/2010   History of IBS 05/28/2010   Diverticulosis of colon (without mention of hemorrhage) 05/28/2010   Arthritis involving multiple sites 05/28/2010   Wheat intolerance 05/28/2010   Chronic venous insufficiency 08/19/2009   Peripheral edema 08/19/2009   EUSTACHIAN TUBE DYSFUNCTION, BILATERAL 12/23/2008   ERECTILE DYSFUNCTION, ORGANIC 08/01/2008   Allergic rhinitis with a nonallergic component 05/20/2008   VENTRAL HERNIA 02/12/2008   PALPITATIONS 12/25/2007   LACTOSE INTOLERANCE 10/17/2007   Hyperlipidemia 09/14/2006   BPH (benign prostatic hyperplasia) 09/14/2006   OSTEOARTHRITIS 09/14/2006    HEART MURMUR, HX OF 09/14/2006   PCP:  Joycelyn Man, NP Pharmacy:   [facility] [address] - [place], [place] - [address] [place] [address] Phone: [phone] Fax: [phone]  [facility] - [place], [place] - [address] [place] [address] Phone: [phone] Fax: [phone]  [facility] [place], [place] - [address] [place] [address] Phone: [phone] Fax: [phone]  [facility] [place], [place] - [address] [place] [address] Phone: [phone] Fax: [phone]     Social Determinants of Health (SDOH) Social History: SDOH Screenings   Food Insecurity: No Food Insecurity (08/23/2022)  Housing: Patient Declined (08/23/2022)  Transportation Needs: No Transportation Needs (08/23/2022)  Utilities: Not At Risk (08/23/2022)  Financial Resource Strain: Low Risk  (02/08/2022)  Social Connections: Unknown (06/09/2021)   Received from [facility]  Tobacco Use: Medium Risk (08/29/2022)   SDOH Interventions:     Readmission Risk Interventions     No data to display

## 2022-08-23 NOTE — Progress Notes (Addendum)
HD#0 SUBJECTIVE:  Patient Summary: Ryan [name] is a 89 y.o. with a pertinent PMH of pulmonary hypertension, decompensated congestive heart failure, atrial fibrillation, and hypertension who presented with worsening hypoxia and admitted for acute on chronic respiratory failure.   Overnight Events: No acute events overnight  Interim History: Patient was evaluated at bedside.  Denies shortness of breath and notes improvement since admission.  He denies chest pain, cough, chills, or other symptoms.  6 L of nasal cannula down to 3 L this morning with good saturations.  Patient noted to be very anxious about hospitalization.  OBJECTIVE:  Vital Signs: Vitals:   08/23/22 0420 08/23/22 0738 08/23/22 0838 08/23/22 1240  BP: 99/68 (!) 95/57 (!) 108/51   Pulse: 64     Resp: 17   (!) 21  Temp: 98.5 F (36.9 C) 98.1 F (36.7 C)  98.7 F (37.1 C)  TempSrc: Oral Oral  Oral  SpO2: 99%     Weight: 88.1 kg     Height:       Supplemental O2: Nasal Cannula SpO2: 99 % O2 Flow Rate (L/min): 3 L/min  Filed Weights   08/12/2022 2100 08/23/22 0420  Weight: 85.3 kg 88.1 kg     Intake/Output Summary (Last 24 hours) at 08/23/2022 1253 Last data filed at 08/23/2022 0839 Gross per 24 hour  Intake 1204.7 ml  Output 66440 ml  Net -9645.3 ml   Net IO Since Admission: -9,645.3 mL [08/23/22 1253]  CBC    Component Value Date/Time   WBC 7.8 08/23/2022 0428   RBC 3.42 (L) 08/23/2022 0428   HGB 8.6 (L) 08/23/2022 0428   HGB 9.7 (L) 12/10/2019 1007   HGB 14.2 06/26/2015 0916   HCT 27.7 (L) 08/23/2022 0428   HCT 33.2 (L) 12/10/2019 1007   HCT 42.9 06/26/2015 0916   PLT 332 08/23/2022 0428   PLT 328 12/10/2019 1007   MCV 81.0 08/23/2022 0428   MCV 85 12/10/2019 1007   MCV 91.8 06/26/2015 0916   MCH 25.1 (L) 08/23/2022 0428   MCHC 31.0 08/23/2022 0428   RDW 16.2 (H) 08/23/2022 0428   RDW 15.8 (H) 12/10/2019 1007   RDW 15.0 (H) 06/26/2015 0916   LYMPHSABS 0.6 (L) 08/06/2022 1314   LYMPHSABS  1.5 12/10/2019 1007   LYMPHSABS 1.4 06/26/2015 0916   MONOABS 0.7 08/21/2022 1314   MONOABS 0.6 06/26/2015 0916   EOSABS 0.2 08/08/2022 1314   EOSABS 0.3 12/10/2019 1007   BASOSABS 0.0 08/21/2022 1314   BASOSABS 0.0 12/10/2019 1007   BASOSABS 0.0 06/26/2015 0916   CMP    Latest Ref Rng & Units 08/23/2022    9:00 AM 08/23/2022    8:13 AM 08/23/2022    4:28 AM  CMP  Glucose 70 - 99 mg/dL 96   89   BUN 8 - 23 mg/dL 347   425   Creatinine 0.61 - 1.24 mg/dL 9.56   3.87   Sodium 564 - 145 mmol/L 132   131   Potassium 3.5 - 5.1 mmol/L 2.3   2.3   Chloride 98 - 111 mmol/L 84   84   CO2 22 - 32 mmol/L 34   32   Calcium 8.9 - 10.3 mg/dL 6.3   6.3   Total Protein 6.5 - 8.1 g/dL  6.6    Total Bilirubin 0.3 - 1.2 mg/dL  0.4    Alkaline Phos 38 - 126 U/L  93    AST 15 - 41 U/L  10  ALT 0 - 44 U/L  10       Physical Exam: Physical Exam Constitutional:      Appearance: He is ill-appearing.  HENT:     Head: Normocephalic and atraumatic.     Comments: Wearing nasal cannula Cardiovascular:     Rate and Rhythm: Normal rate and regular rhythm.  Pulmonary:     Breath sounds: Wheezing and rales present.  Musculoskeletal:     Right lower leg: Edema present.     Left lower leg: Edema present.  Neurological:     General: No focal deficit present.  Psychiatric:        Mood and Affect: Mood is anxious.    ASSESSMENT/PLAN:  Assessment: Principal Problem:   Acute on chronic hypoxic respiratory failure (HCC)   Plan: # Acute on chronic diastolic congestive heart failure #Shortness of breath On admission, BNP is elevated to 162 with significant bilateral edema. CXR demonstrated low lung volumes with interstitial edema.  His oxygen requirement decreased from 6 L nasal cannula to 3 L nasal cannula.  He noted improved breathing this morning compared to admission but still has bilateral rales with wheezing.  He also still has significant bilateral lower extremity edema. Over the past 24 hours,  his net output is -9.6 L. Followed by cardiology.  - Lasix gtt held this morning to replenish potassium and magnesium - Continue to wean oxygen - Echo scheduled for today - Continue with midodrine for blood pressure support  #Hypokalemia Admitted with a potassium level of 3.4. This morning, K was critically low at 2.3.  Potassium was replenished this morning but repeat BMP again showed critically low potassium at 2.3.  IV Lasix drip was held to help replenish the low values. - Replenish as needed - Repeat BMP every 4 hours  #Hypocalcemia Calcium on admission was 6.2 that is likely secondary to CKD.  Received calcium gluconate.  Today, corrected calcium is 7. - Continue to receive IV calcium - Start PO calcium TID - Trend BMP and replenish as needed - F/u PTH, vitamin D  #Hyponatremia  Admitted with a sodium of 126. This morning, sodium improved to 132. - Continue to closely monitor to avoid overcorrection  #Acute on chronic kidney disease Currently in CKD stage IV. His baseline creatinine is 2.28.  This morning, his creatinine normalized to his baseline of 2.12. - Monitor function and continue to trend labs  # Atrial fibrillation with RVR Repeat EKG this morning showed stable A-fib.  His heart rate remains between 60 and 85. - Continue Eliquis - Metoprolol 12.5 mg   #Anemia of chronic disease Hb stable at 9.5 without any s/s of bleeding. - Daily CBC  #Pulmonary hypertension #Interstitial lung disease He follows with Dr. Shirlee Latch. He is not reporting worsening cough, fever. He had chest x-ray, concerning for interstitial edema. - Continue Riociguat 2.5 mg TID. - Continue Selexipag. - Continue UPTRAVI.   #Hypocalcemia Calcium 6.2 and ionized calcium decreased at 0.74. Hypocalcemia 2/2 to chronic kidney disease. - Continue calcium gluconate  #Hypokalemia -Presented with potassium of 2.3.  Stable at 3.4 this morning -Replenish as needed  #Hyponatremia Decreased at 126  this morning -Replenish  #History of hyperlipidemia -Lipitor 40 mg . #History of depression Lexapro 10 mg   #History of restless syndrome -Ropinorole to 2 mg TID   #History of chronic hypotension -Continue midodrine 10 mg TID   # History of BPH -Finasteride 5 mg    History of GERD -Omeprazole 20 mg twice daily -Zofran   #  History of insomnia -Trazodone at bedtime.    #History of low back pain Follows with pain med specialist.  -Buprenorphine 5 mcg/hr weekly patch -Gabapentin 100 mg 2 times daily.   #History of constipation -Continue senna-docusate as needed  Best Practice: Diet: Cardiac diet VTE: apixaban (ELIQUIS) tablet 2.5 mg Start: 08/10/2022 2200 Code: DNR DISPO: Anticipated discharge to  TBD  pending  resolution of symptoms .  Signature: Morrie Sheldon, MD Internal Medicine Resident, PGY-1 [facility]  Pager: [phone]  Please contact the on call pager after 5 pm and on weekends at [phone].

## 2022-08-23 NOTE — Progress Notes (Addendum)
   Patient Name: Ryan [name] Date of Encounter: 08/23/2022 [facility] Cardiologist: Nanetta Batty, MD Dr. Shirlee Latch  Interval Summary  .    He is asking for a tranquilizer.  He is anxious.  Vital Signs .    Vitals:   08/23/22 0400 08/23/22 0420 08/23/22 0738 08/23/22 0838  BP:  99/68 (!) 95/57 (!) 108/51  Pulse:  64    Resp:  17    Temp:  98.5 F (36.9 C) 98.1 F (36.7 C)   TempSrc:  Oral Oral   SpO2: 99% 99%    Weight:  88.1 kg    Height:        Intake/Output Summary (Last 24 hours) at 08/23/2022 0934 Last data filed at 08/23/2022 0839 Gross per 24 hour  Intake 1204.7 ml  Output 21308 ml  Net -9645.3 ml      08/23/2022    4:20 AM 08/04/2022    9:00 PM 08/13/2022   11:43 AM  Last 3 Weights  Weight (lbs) 194 lb 3.6 oz 188 lb 188 lb  Weight (kg) 88.1 kg 85.276 kg 85.276 kg      Telemetry/ECG    Sinus rhythm with occasional atrial tachycardia- Personally Reviewed  Physical Exam .   GEN: No acute distress.  Difficult eyesight. Neck: No JVD Cardiac: RRR with ectopy, soft systolic murmur, no rubs, or gallops.  Respiratory: Diminished breath sounds bilaterally. GI: Soft, nontender, mildly distended  MS: Chronic appearing lymphedema bilaterally with reddened pretibial region  Assessment & Plan .     89 year old male with chronic end-stage diastolic heart failure, pulmonary hypertension with anasarca stage IV chronic kidney disease.  Acute on chronic diastolic heart failure -IV Lasix drip was stopped earlier this morning likely because of blood pressures 95/57.  Would recommend trying to restart.  Continue with midodrine for blood pressure support.  Hypokalemia - Most recently 2.3, replating aggressively.  Creatinine down from 2.6-2.2.  Had discussion with him about his multisystem ongoing organ failure ranging from severe chronic kidney disease to chronic diastolic heart failure, anasarca, lymphedema.  Now he states that he is anxious and would like to  have a tranquilizer.  I think goals of care ultimately should be discussed given his decreased quality of life.  Began discussion about ultimate goals/palliation.  Consider further palliative care team discussion.  He is currently DNR.  Discussed as well with advanced heart failure team.   For questions or updates, please contact [facility] Please consult [email or web address] for contact info under        Signed, Donato Schultz, MD

## 2022-08-24 DIAGNOSIS — J9621 Acute and chronic respiratory failure with hypoxia: Secondary | ICD-10-CM | POA: Diagnosis not present

## 2022-08-24 DIAGNOSIS — E876 Hypokalemia: Secondary | ICD-10-CM | POA: Diagnosis not present

## 2022-08-24 DIAGNOSIS — I5033 Acute on chronic diastolic (congestive) heart failure: Secondary | ICD-10-CM | POA: Diagnosis not present

## 2022-08-24 LAB — BASIC METABOLIC PANEL
Anion gap: 15 (ref 5–15)
Anion gap: 15 (ref 5–15)
Anion gap: 13 (ref 5–15)
Anion gap: 13 (ref 5–15)
Anion gap: 16 — ABNORMAL HIGH (ref 5–15)
BUN: 109 mg/dL — ABNORMAL HIGH (ref 8–23)
BUN: 96 mg/dL — ABNORMAL HIGH (ref 8–23)
BUN: 94 mg/dL — ABNORMAL HIGH (ref 8–23)
BUN: 98 mg/dL — ABNORMAL HIGH (ref 8–23)
BUN: 101 mg/dL — ABNORMAL HIGH (ref 8–23)
CO2: 34 mmol/L — ABNORMAL HIGH (ref 22–32)
CO2: 37 mmol/L — ABNORMAL HIGH (ref 22–32)
CO2: 35 mmol/L — ABNORMAL HIGH (ref 22–32)
CO2: 37 mmol/L — ABNORMAL HIGH (ref 22–32)
CO2: 36 mmol/L — ABNORMAL HIGH (ref 22–32)
Calcium: 7 mg/dL — ABNORMAL LOW (ref 8.9–10.3)
Calcium: 7.2 mg/dL — ABNORMAL LOW (ref 8.9–10.3)
Calcium: 7.1 mg/dL — ABNORMAL LOW (ref 8.9–10.3)
Calcium: 7 mg/dL — ABNORMAL LOW (ref 8.9–10.3)
Calcium: 7.2 mg/dL — ABNORMAL LOW (ref 8.9–10.3)
Chloride: 86 mmol/L — ABNORMAL LOW (ref 98–111)
Chloride: 84 mmol/L — ABNORMAL LOW (ref 98–111)
Chloride: 86 mmol/L — ABNORMAL LOW (ref 98–111)
Chloride: 85 mmol/L — ABNORMAL LOW (ref 98–111)
Chloride: 84 mmol/L — ABNORMAL LOW (ref 98–111)
Creatinine, Ser: 1.8 mg/dL — ABNORMAL HIGH (ref 0.61–1.24)
Creatinine, Ser: 1.64 mg/dL — ABNORMAL HIGH (ref 0.61–1.24)
Creatinine, Ser: 1.74 mg/dL — ABNORMAL HIGH (ref 0.61–1.24)
Creatinine, Ser: 1.84 mg/dL — ABNORMAL HIGH (ref 0.61–1.24)
Creatinine, Ser: 1.95 mg/dL — ABNORMAL HIGH (ref 0.61–1.24)
GFR, Estimated: 36 mL/min — ABNORMAL LOW (ref >60)
GFR, Estimated: 40 mL/min — ABNORMAL LOW (ref >60)
GFR, Estimated: 37 mL/min — ABNORMAL LOW (ref >60)
GFR, Estimated: 35 mL/min — ABNORMAL LOW (ref >60)
GFR, Estimated: 32 mL/min — ABNORMAL LOW (ref >60)
Glucose, Bld: 120 mg/dL — ABNORMAL HIGH (ref 70–99)
Glucose, Bld: 151 mg/dL — ABNORMAL HIGH (ref 70–99)
Glucose, Bld: 178 mg/dL — ABNORMAL HIGH (ref 70–99)
Glucose, Bld: 144 mg/dL — ABNORMAL HIGH (ref 70–99)
Glucose, Bld: 121 mg/dL — ABNORMAL HIGH (ref 70–99)
Potassium: 3 mmol/L — ABNORMAL LOW (ref 3.5–5.1)
Potassium: 3.1 mmol/L — ABNORMAL LOW (ref 3.5–5.1)
Potassium: 3.6 mmol/L (ref 3.5–5.1)
Potassium: 3.6 mmol/L (ref 3.5–5.1)
Potassium: 3.9 mmol/L (ref 3.5–5.1)
Sodium: 135 mmol/L (ref 135–145)
Sodium: 136 mmol/L (ref 135–145)
Sodium: 134 mmol/L — ABNORMAL LOW (ref 135–145)
Sodium: 135 mmol/L (ref 135–145)
Sodium: 136 mmol/L (ref 135–145)

## 2022-08-24 LAB — CBC
HCT: 28 % — ABNORMAL LOW (ref 39.0–52.0)
Hemoglobin: 8.7 g/dL — ABNORMAL LOW (ref 13.0–17.0)
MCH: 25.3 pg — ABNORMAL LOW (ref 26.0–34.0)
MCHC: 31.1 g/dL (ref 30.0–36.0)
MCV: 81.4 fL (ref 80.0–100.0)
Platelets: 335 10*3/uL (ref 150–400)
RBC: 3.44 MIL/uL — ABNORMAL LOW (ref 4.22–5.81)
RDW: 16.2 % — ABNORMAL HIGH (ref 11.5–15.5)
WBC: 11.3 10*3/uL — ABNORMAL HIGH (ref 4.0–10.5)
nRBC: 0 % (ref 0.0–0.2)

## 2022-08-24 LAB — PTH, INTACT AND CALCIUM
Calcium, Total (PTH): 6.9 mg/dL (ref 8.6–10.2)
PTH: 53 pg/mL (ref 15–65)

## 2022-08-24 LAB — MAGNESIUM: Magnesium: 2.2 mg/dL (ref 1.7–2.4)

## 2022-08-24 MED ORDER — SELEXIPAG 400 MCG PO TABS
400.0000 ug | ORAL_TABLET | ORAL | Status: DC
  Administered 2022-08-24 – 2022-08-26 (×5): 400 ug via ORAL
  Filled 2022-08-24 (×6): qty 1

## 2022-08-24 MED ORDER — POTASSIUM CHLORIDE CRYS ER 20 MEQ PO TBCR
40.0000 meq | EXTENDED_RELEASE_TABLET | ORAL | Status: AC
  Administered 2022-08-24 (×2): 40 meq via ORAL
  Filled 2022-08-24 (×2): qty 2

## 2022-08-24 MED ORDER — POTASSIUM CHLORIDE 10 MEQ/100ML IV SOLN: 10.0000 meq | INTRAVENOUS | Status: DC

## 2022-08-24 MED ORDER — SELEXIPAG 1600 MCG PO TABS
1.0000 | ORAL_TABLET | ORAL | Status: DC
  Filled 2022-08-24: qty 1

## 2022-08-24 MED ORDER — SELEXIPAG 400 MCG PO TABS
1.0000 | ORAL_TABLET | ORAL | Status: DC
  Filled 2022-08-24: qty 1

## 2022-08-24 MED ORDER — SELEXIPAG 400 MCG PO TABS
400.0000 ug | ORAL_TABLET | ORAL | Status: DC
  Filled 2022-08-24 (×2): qty 1

## 2022-08-24 MED ORDER — POTASSIUM CHLORIDE 10 MEQ/100ML IV SOLN
10.0000 meq | INTRAVENOUS | Status: AC
  Administered 2022-08-24 (×6): 10 meq via INTRAVENOUS
  Filled 2022-08-24 (×6): qty 100

## 2022-08-24 MED ORDER — RIOCIGUAT 2.5 MG PO TABS
2.5000 mg | ORAL_TABLET | Freq: Three times a day (TID) | ORAL | Status: DC
  Administered 2022-08-24 – 2022-08-26 (×8): 2.5 mg via ORAL
  Filled 2022-08-24 (×9): qty 1

## 2022-08-24 MED ORDER — SELEXIPAG 1600 MCG PO TABS
1.0000 | ORAL_TABLET | ORAL | Status: DC
  Administered 2022-08-24 – 2022-08-26 (×5): 1600 ug via ORAL
  Filled 2022-08-24 (×5): qty 1

## 2022-08-24 NOTE — Evaluation (Signed)
Physical Therapy Evaluation Patient Details Name: Ryan [name] MRN: [id number] DOB: 1932-01-20 Today's Date: 08/24/2022  History of Present Illness  89 yo male presents to [facility] on 7/21 with SOB, BLE edema. PMH: asthma, barrett's esophagus, CHF, COPD on 2LO2 chronically, DM2, HLD, HTN, non-Hodgkin's lymphoma, melanoma, CKDIV, afib, pulmonary HTN, ILD.  Clinical Impression   Pt presents with generalized weakness, LE edema, slowed processing and attention during session, impaired balance, difficulty mobilizing, and decreased activity tolerance. Pt to benefit from acute PT to address deficits. Pt requiring mod-max +2 assist for bed mobility and transfer to recliner, pt typically is independent with mobility. Pt would benefit from short-term low-intensity rehabilitation post-acutely. PT to progress mobility as tolerated, and will continue to follow acutely.      SATURATION QUALIFICATIONS: (This note is used to comply with regulatory documentation for home oxygen)  Patient Saturations on Room Air at Rest = 81%  Patient Saturations on Room Air while Ambulating = n/a%  Patient Saturations on 3 Liters of oxygen while Ambulating = 95%  Please briefly explain why patient needs home oxygen: to maintain SPO2 >88%    Assistance Recommended at Discharge Frequent or constant Supervision/Assistance  If plan is discharge home, recommend the following:  Can travel by private vehicle  A lot of help with walking and/or transfers;A lot of help with bathing/dressing/bathroom   No    Equipment Recommendations None recommended by PT  Recommendations for Other Services       Functional Status Assessment Patient has had a recent decline in their functional status and demonstrates the ability to make significant improvements in function in a reasonable and predictable amount of time.     Precautions / Restrictions Precautions Precautions: Fall Precaution Comments: 3LO2 Restrictions Weight Bearing  Restrictions: No      Mobility  Bed Mobility Overal bed mobility: Needs Assistance Bed Mobility: Supine to Sit     Supine to sit: Max assist     General bed mobility comments: assist for trunk elevation, LE progresion to EOB, and scooting towards EOB.    Transfers Overall transfer level: Needs assistance Equipment used: Rolling walker (2 wheels), 2 person hand held assist Transfers: Sit to/from Stand, Bed to chair/wheelchair/BSC Sit to Stand: Mod assist, +2 physical assistance   Step pivot transfers: Mod assist, +2 physical assistance       General transfer comment: mod +2 for power up, rise, steadying, and pivot to recliner with max cuing    Ambulation/Gait               General Gait Details: nt  Stairs            Wheelchair Mobility     Tilt Bed    Modified Rankin (Stroke Patients Only)       Balance Overall balance assessment: Needs assistance, History of Falls Sitting-balance support: No upper extremity supported, Feet supported Sitting balance-Leahy Scale: Fair     Standing balance support: Bilateral upper extremity supported, During functional activity, Reliant on assistive device for balance Standing balance-Leahy Scale: Poor Standing balance comment: reliant on RW vs bilat HHA                             Pertinent Vitals/Pain Pain Assessment Pain Assessment: Faces Faces Pain Scale: Hurts little more Pain Location: LEs Pain Descriptors / Indicators: Sore, Discomfort Pain Intervention(s): Limited activity within patient's tolerance, Monitored during session, Repositioned    Home Living Family/patient expects to  be discharged to:: Private residence Living Arrangements: Alone Available Help at Discharge: Other (Comment) (aide PRN) Type of Home: Independent living facility Home Access: Level entry       Home Layout: One level Home Equipment: Rollator (4 wheels);Electric scooter;Shower seat;Grab bars - toilet;Grab bars  - tub/shower      Prior Function Prior Level of Function : Needs assist;Patient poor historian/Family not available             Mobility Comments: pt reports using rollator for walking around apt and to/from dining hall. pt's wife does not live in ILF ADLs Comments: Pt reports needing assist for getting dressed sometimes     Hand Dominance   Dominant Hand: Right    Extremity/Trunk Assessment   Upper Extremity Assessment Upper Extremity Assessment: Defer to OT evaluation    Lower Extremity Assessment Lower Extremity Assessment: Generalized weakness    Cervical / Trunk Assessment Cervical / Trunk Assessment: Normal  Communication   Communication: HOH  Cognition Arousal/Alertness: Lethargic Behavior During Therapy: Flat affect Overall Cognitive Status: Impaired/Different from baseline Area of Impairment: Attention, Following commands, Safety/judgement, Problem solving                   Current Attention Level: Focused   Following Commands: Follows one step commands with increased time Safety/Judgement: Decreased awareness of safety, Decreased awareness of deficits   Problem Solving: Slow processing, Decreased initiation, Requires verbal cues, Difficulty sequencing, Requires tactile cues General Comments: pt requires frequent cuing to maintain attention, answer questions. Pt states his thinking feels "foggy"        General Comments General comments (skin integrity, edema, etc.): LEs edematous and red, tender to touch with sock donning    Exercises General Exercises - Lower Extremity Ankle Circles/Pumps: AROM, Both, 10 reps, Seated   Assessment/Plan    PT Assessment Patient needs continued PT services  PT Problem List Decreased strength;Decreased mobility;Decreased activity tolerance;Decreased balance;Decreased knowledge of use of DME;Pain;Decreased safety awareness;Cardiopulmonary status limiting activity;Decreased cognition       PT Treatment  Interventions DME instruction;Therapeutic activities;Gait training;Therapeutic exercise;Patient/family education;Balance training;Functional mobility training;Neuromuscular re-education    PT Goals (Current goals can be found in the Care Plan section)  Acute Rehab PT Goals PT Goal Formulation: With patient Time For Goal Achievement: 09/07/22 Potential to Achieve Goals: Good    Frequency Min 1X/week     Co-evaluation               AM-PAC PT "6 Clicks" Mobility  Outcome Measure Help needed turning from your back to your side while in a flat bed without using bedrails?: A Lot Help needed moving from lying on your back to sitting on the side of a flat bed without using bedrails?: A Lot Help needed moving to and from a bed to a chair (including a wheelchair)?: A Lot Help needed standing up from a chair using your arms (e.g., wheelchair or bedside chair)?: A Lot Help needed to walk in hospital room?: Total Help needed climbing 3-5 steps with a railing? : Total 6 Click Score: 10    End of Session Equipment Utilized During Treatment: Gait belt;Oxygen Activity Tolerance: Patient limited by fatigue Patient left: in chair;with call bell/phone within reach;with chair alarm set Nurse Communication: Mobility status;Need for lift equipment (stedy for back to bed) PT Visit Diagnosis: Other abnormalities of gait and mobility (R26.89);Muscle weakness (generalized) (M62.81)    Time: 3086-5784 PT Time Calculation (min) (ACUTE ONLY): 38 min   Charges:   PT Evaluation $  PT Eval Low Complexity: 1 Low   PT General Charges $$ ACUTE PT VISIT: 1 Visit         Marye Round, PT DPT Acute Rehabilitation Services Secure Chat Preferred  Office [phone]   Lionardo Haze Sheliah Plane 08/24/2022, 10:18 AM

## 2022-08-24 NOTE — Progress Notes (Signed)
HD#1 SUBJECTIVE:  Patient Summary: Ryan [name] is a 89 y.o. with a pertinent PMH of pulmonary hypertension, decompensated congestive heart failure, atrial fibrillation, and hypertension who presented with worsening hypoxia and admitted for acute on chronic respiratory failure.   Overnight Events: No acute events overnight  Interim History: Patient was evaluated at bedside. Patient notes improvement in breathing.  Significant bilateral lower extremity edema still present but improving.  Denies nausea, vomiting, chest pain, or other concerns.  OBJECTIVE:  Vital Signs: Vitals:   08/24/22 0831 08/24/22 0900 08/24/22 1144 08/24/22 1247  BP:    91/60  Pulse:   72   Resp:   15   Temp:    98.4 F (36.9 C)  TempSrc:    Oral  SpO2: (!) 83% 95%    Weight:      Height:       Supplemental O2: Nasal Cannula SpO2: 95 % O2 Flow Rate (L/min): 3 L/min  Filed Weights   08/31/2022 2100 08/23/22 0420 08/24/22 0344  Weight: 85.3 kg 88.1 kg 84.2 kg    Intake/Output Summary (Last 24 hours) at 08/24/2022 1338 Last data filed at 08/24/2022 1128 Gross per 24 hour  Intake 837.04 ml  Output 7850 ml  Net -7012.96 ml   Net IO Since Admission: -16,321.26 mL [08/24/22 1338]  CBC    Component Value Date/Time   WBC 11.3 (H) 08/24/2022 0228   RBC 3.44 (L) 08/24/2022 0228   HGB 8.7 (L) 08/24/2022 0228   HGB 9.7 (L) 12/10/2019 1007   HGB 14.2 06/26/2015 0916   HCT 28.0 (L) 08/24/2022 0228   HCT 33.2 (L) 12/10/2019 1007   HCT 42.9 06/26/2015 0916   PLT 335 08/24/2022 0228   PLT 328 12/10/2019 1007   MCV 81.4 08/24/2022 0228   MCV 85 12/10/2019 1007   MCV 91.8 06/26/2015 0916   MCH 25.3 (L) 08/24/2022 0228   MCHC 31.1 08/24/2022 0228   RDW 16.2 (H) 08/24/2022 0228   RDW 15.8 (H) 12/10/2019 1007   RDW 15.0 (H) 06/26/2015 0916   LYMPHSABS 0.6 (L) 08/17/2022 1314   LYMPHSABS 1.5 12/10/2019 1007   LYMPHSABS 1.4 06/26/2015 0916   MONOABS 0.7 08/10/2022 1314   MONOABS 0.6 06/26/2015 0916    EOSABS 0.2 08/04/2022 1314   EOSABS 0.3 12/10/2019 1007   BASOSABS 0.0 08/18/2022 1314   BASOSABS 0.0 12/10/2019 1007   BASOSABS 0.0 06/26/2015 0916   CMP    Latest Ref Rng & Units 08/24/2022   10:49 AM 08/24/2022    2:28 AM 08/23/2022    9:51 PM  CMP  Glucose 70 - 99 mg/dL 952  841  324   BUN 8 - 23 mg/dL 96  401  027   Creatinine 0.61 - 1.24 mg/dL 2.53  6.64  4.03   Sodium 135 - 145 mmol/L 136  135  133   Potassium 3.5 - 5.1 mmol/L 3.1  3.0  3.5   Chloride 98 - 111 mmol/L 84  86  84   CO2 22 - 32 mmol/L 37  34  34   Calcium 8.9 - 10.3 mg/dL 7.2  7.0  6.9      Physical Exam: Physical Exam Constitutional:      Appearance: He is ill-appearing.  HENT:     Head: Normocephalic and atraumatic.  Cardiovascular:     Rate and Rhythm: Normal rate and regular rhythm.  Pulmonary:     Effort: Pulmonary effort is normal.     Breath sounds: Wheezing  and rales present.  Skin:    General: Skin is warm.  Neurological:     General: No focal deficit present.     Mental Status: He is alert.  Psychiatric:        Mood and Affect: Mood and affect normal.     ASSESSMENT/PLAN:  Assessment: Principal Problem:   Acute on chronic hypoxic respiratory failure (HCC) Active Problems:   Hypocalcemia  Plan: # Acute on chronic diastolic congestive heart failure #Shortness of breath Over the past 24 hours, his net output is -7 L. Cards following. Currently on 3 L [place]. Remains volume overloaded but improving. - Continue Lasix gtt - Continue to wean oxygen - Consult palliative - Continue with midodrine for blood pressure support - PT/OT  - Ambulatory pulse ox today  #Hypokalemia #Hypomagnesia, resolved Admitted with a potassium level of 3.4. This morning, K was critically low at 3.0. Replenished with 60 mEq. Mg normalized at 2.2 this AM.  - Replenish as needed - Repeat BMP every 4 hours  #Hypocalcemia Calcium on admission was 6.2 that is likely secondary to CKD.  Received calcium  gluconate.  Corrected calcium this AM 7.7. PTH and Vitamin D normal. Phosphorous normal.  - Continue PO calcium TID - Trend BMP and replenish as needed  #Hyponatremia, resolved Admitted with a sodium of 126. This morning, sodium improved to 135. - Continue to closely monitor BMP  #Acute on chronic kidney disease Currently in CKD stage IV. His baseline creatinine is 2.28.  This morning, his creatinine continued to improve to 1.80. - Monitor function and continue to trend labs  # Atrial fibrillation with RVR His heart rate remains between 60 and 90.  Denies any associated s/s. - Continue Eliquis  #Anemia of chronic disease Hb stable at 8.7 without any s/s of bleeding. - Daily CBC  #Pulmonary hypertension #Interstitial lung disease He follows with Dr. Shirlee Latch. He is not reporting worsening cough, fever. He had chest x-ray, concerning for interstitial edema - Continue Riociguat 2.5 mg TID - Continue Selexipag - Continue UPTRAVI  #History of hyperlipidemia -Lipitor 40 mg . #History of depression -Lexapro 10 mg   #History of restless syndrome -Ropinorole to 2 mg TID   #History of chronic hypotension -Continue midodrine 10 mg TID   #History of BPH -Finasteride 5 mg    #History of GERD -Omeprazole 20 mg twice daily -Zofran   #History of insomnia -Trazodone at bedtime.    #History of low back pain Follows with pain med specialist.  -Buprenorphine 5 mcg/hr weekly patch -Gabapentin 100 mg 2 times daily.   #History of constipation -Continue senna-docusate as needed  Best Practice: Diet: Cardiac diet VTE: apixaban (ELIQUIS) tablet 2.5 mg Start: 08/07/2022 2200 Code: DNR DISPO: Anticipated discharge to  TBD  pending  resolution of symptoms .  Signature: Morrie Sheldon, MD Internal Medicine Resident, PGY-1 [facility]  Pager: [phone]  Please contact the on call pager after 5 pm and on weekends at [phone].

## 2022-08-24 NOTE — Plan of Care (Signed)

## 2022-08-24 NOTE — Progress Notes (Signed)
Occupational Therapy Treatment Patient Details Name: Ryan [name] MRN: [id number] DOB: 02-12-1931 Today's Date: 08/24/2022   History of present illness 89 yo male presents to [facility] on 7/21 with SOB, BLE edema. PMH: asthma, barrett's esophagus, CHF, COPD on 2LO2 chronically, DM2, HLD, HTN, non-Hodgkin's lymphoma, melanoma, CKDIV, afib, pulmonary HTN, ILD.   OT comments  Pt is unreliable historian (question pain meds prior to session impacting cog?) but says he typically gets assist PRN for LB dressing from aide, and uses a Rollator but otherwise does his own cooking, and is fairly independent. 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 Today he is lethargic, stated "I am muddy headed" and requires cues for initiation and sequencing all aspects of mobility and ADL. BLE tender and max/total A for LB ADL, mod A for UB ADL and max A +2 for transfers at this time. Pt will benefit from skilled OT in the acute setting and afterwards at post-acute rehab of <3 hours daily. Next session focus on OOB as well as functional UB tasks for more functional problem solving/cog.   Recommendations for follow up therapy are one component of a multi-disciplinary discharge planning process, led by the attending physician.  Recommendations may be updated based on patient status, additional functional criteria and insurance authorization.    Assistance Recommended at Discharge Frequent or constant Supervision/Assistance  Patient can return home with the following  Two people to help with walking and/or transfers;A lot of help with bathing/dressing/bathroom;Assistance with cooking/housework;Direct supervision/assist for medications management;Assist for transportation;Help with stairs or ramp for entrance   Equipment Recommendations  None recommended by OT (Pt has appropriate DME)    Recommendations for Other Services PT consult    Precautions / Restrictions Precautions Precautions: Fall Precaution Comments:  3LO2 Restrictions Weight Bearing Restrictions: No       Mobility Bed Mobility Overal bed mobility: Needs Assistance Bed Mobility: Supine to Sit     Supine to sit: Max assist     General bed mobility comments: assist for trunk elevation, LE progresion to EOB, and scooting towards EOB.    Transfers Overall transfer level: Needs assistance Equipment used: Rolling walker (2 wheels), 2 person hand held assist Transfers: Sit to/from Stand, Bed to chair/wheelchair/BSC Sit to Stand: Mod assist, +2 physical assistance     Step pivot transfers: Mod assist, +2 physical assistance     General transfer comment: mod +2 for power up, rise, steadying, and pivot to recliner with max cuing     Balance Overall balance assessment: Needs assistance, History of Falls Sitting-balance support: No upper extremity supported, Feet supported Sitting balance-Leahy Scale: Fair     Standing balance support: Bilateral upper extremity supported, During functional activity, Reliant on assistive device for balance Standing balance-Leahy Scale: Poor Standing balance comment: reliant on RW vs bilat HHA                           ADL either performed or assessed with clinical judgement   ADL Overall ADL's : Needs assistance/impaired Eating/Feeding: Set up;Sitting   Grooming: Wash/dry face;Min guard;Sitting Grooming Details (indicate cue type and reason): cues to initiate task Upper Body Bathing: Moderate assistance   Lower Body Bathing: Maximal assistance   Upper Body Dressing : Moderate assistance   Lower Body Dressing: Total assistance   Toilet Transfer: Maximal assistance;+2 for physical assistance;+2 for safety/equipment;Stand-pivot Toilet Transfer Details (indicate cue type and reason): 2 person face to face transfercues for sequencing Toileting- Clothing Manipulation and Hygiene:  Total assistance       Functional mobility during ADLs: Maximal assistance;+2 for physical  assistance;+2 for safety/equipment;Rolling walker (2 wheels);Cueing for sequencing (and 2 person face to face for transfer)      Extremity/Trunk Assessment Upper Extremity Assessment Upper Extremity Assessment: Generalized weakness (hx of arthritis- pain with WB)   Lower Extremity Assessment Lower Extremity Assessment: Defer to PT evaluation   Cervical / Trunk Assessment Cervical / Trunk Assessment: Normal    Vision Baseline Vision/History: 1 Wears glasses Patient Visual Report: No change from baseline     Perception     Praxis      Cognition Arousal/Alertness: Lethargic Behavior During Therapy: Flat affect Overall Cognitive Status: Impaired/Different from baseline Area of Impairment: Attention, Following commands, Safety/judgement, Problem solving                   Current Attention Level: Focused   Following Commands: Follows one step commands with increased time Safety/Judgement: Decreased awareness of safety, Decreased awareness of deficits   Problem Solving: Slow processing, Decreased initiation, Requires verbal cues, Difficulty sequencing, Requires tactile cues General Comments: pt requires frequent cuing to maintain attention, answer questions. Pt states his thinking feels "foggy"        Exercises General Exercises - Lower Extremity Ankle Circles/Pumps: AROM, Both, 10 reps, Seated    Shoulder Instructions       General Comments LEs edematous and red, tender to touch with sock donning    Pertinent Vitals/ Pain       Pain Assessment Pain Assessment: Faces Faces Pain Scale: Hurts little more Pain Location: LEs Pain Descriptors / Indicators: Sore, Discomfort Pain Intervention(s): Limited activity within patient's tolerance, Monitored during session, Repositioned  Home Living Family/patient expects to be discharged to:: Private residence Living Arrangements: Alone Available Help at Discharge: Other (Comment) (aide PRN) Type of Home: Independent  living facility Home Access: Level entry     Home Layout: One level     Bathroom Shower/Tub: Producer, television/film/video: Handicapped height     Home Equipment: Rollator (4 wheels);Electric scooter;Shower seat;Grab bars - toilet;Grab bars - tub/shower   Additional Comments: just started to use 2-3 L O2 a few days before coming to hospital      Prior Functioning/Environment              Frequency  Min 1X/week        Progress Toward Goals  OT Goals(current goals can now be found in the care plan section)     Acute Rehab OT Goals Patient Stated Goal: get my head clear again OT Goal Formulation: With patient Time For Goal Achievement: 09/07/22 Potential to Achieve Goals: Good ADL Goals Pt Will Perform Grooming: with set-up;sitting Pt Will Transfer to Toilet: with mod assist;stand pivot transfer Pt Will Perform Toileting - Clothing Manipulation and hygiene: with mod assist;sit to/from stand Pt/caregiver will Perform Home Exercise Program: Both right and left upper extremity;With theraband;Independently;With written HEP provided Additional ADL Goal #1: Pt will verbalizeat least 3 energy conservation strategies for ADL with no cues  Plan      Co-evaluation                 AM-PAC OT "6 Clicks" Daily Activity     Outcome Measure   Help from another person eating meals?: A Little Help from another person taking care of personal grooming?: A Little Help from another person toileting, which includes using toliet, bedpan, or urinal?: A Lot Help from another person  bathing (including washing, rinsing, drying)?: A Lot Help from another person to put on and taking off regular upper body clothing?: A Lot Help from another person to put on and taking off regular lower body clothing?: Total 6 Click Score: 13    End of Session Equipment Utilized During Treatment: Gait belt;Rolling walker (2 wheels);Oxygen (3L)  OT Visit Diagnosis: Unsteadiness on feet  (R26.81);Other abnormalities of gait and mobility (R26.89);Muscle weakness (generalized) (M62.81);Other symptoms and signs involving cognitive function;Pain Pain - Right/Left: Right (bilateral) Pain - part of body: Leg   Activity Tolerance Patient tolerated treatment well   Patient Left in chair;with call bell/phone within reach;with chair alarm set   Nurse Communication Mobility status;Need for lift equipment (stedy)        Time: 1610-9604 OT Time Calculation (min): 25 min  Charges: OT General Charges $OT Visit: 1 Visit OT Evaluation $OT Eval Moderate Complexity: 1 Mod  Nyoka Cowden OTR/L Acute Rehabilitation Services Office: [phone]  Evern Bio [facility] 08/24/2022, 12:28 PM

## 2022-08-24 NOTE — NC FL2 (Cosign Needed)
[place] MEDICAID FL2 LEVEL OF CARE FORM     IDENTIFICATION  Patient Name: Ryan [name] Birthdate: October 08, 1931 Sex: male Admission Date (Current Location): 08/26/2022  [facility] and [place] Number:  Producer, television/film/video and Address:  [facility], [address] [place], [place] [address]      Provider Number: [id number]  Attending Physician Name and Address:  Dickie La, MD  Relative Name and Phone Number:  Erskine Squibb (spouse) [phone]    Current Level of Care: Hospital Recommended Level of Care: Skilled Nursing Facility Prior Approval Number:    Date Approved/Denied:   PASRR Number: PASRR under review  Discharge Plan: SNF    Current Diagnoses: Patient Active Problem List   Diagnosis Date Noted   Hypocalcemia 08/23/2022   Acute on chronic hypoxic respiratory failure (HCC) 08/13/2022   Adjustment disorder with mixed anxiety and depressed mood 08/13/2022   Acute on chronic diastolic (congestive) heart failure (HCC) 02/03/2022   Acute on chronic respiratory failure with hypoxia (HCC) 02/03/2022   Urinary tract infection 02/03/2022   Cellulitis of leg 02/03/2022   Chronic hypotension 02/03/2022   Paroxysmal atrial fibrillation (HCC) 02/03/2022   History of non-Hodgkin's lymphoma 02/03/2022   Hypochromic anemia    GI bleed 10/17/2020   B12 deficiency 05/28/2019   Cerebrovascular accident (CVA) due to embolism of precerebral artery (HCC) 05/28/2019   Diabetic polyneuropathy associated with type 2 diabetes mellitus (HCC) 05/28/2019   Acute CVA (cerebrovascular accident) (HCC) 04/24/2019   Diabetes mellitus type 2 in nonobese (HCC) 04/24/2019   Acute respiratory failure with hypoxia and hypercarbia (HCC) 09/20/2018   CKD (chronic kidney disease) stage 3, GFR 30-59 ml/min (HCC) 09/20/2018   Lethargy 09/20/2018   Acute metabolic encephalopathy 09/20/2018   Chronic diastolic CHF (congestive heart failure) (HCC) 09/20/2018   Acute kidney injury  superimposed on chronic kidney disease (HCC) 09/10/2018   Pulmonary hypertension, unspecified (HCC) 09/10/2018   Multifocal atrial tachycardia 09/10/2018   CHF exacerbation (HCC) 09/07/2018   Leg pain 09/07/2018   Hyperkalemia 07/26/2018   Bilateral lower extremity edema 02/02/2017   Pulmonary fibrosis (HCC) 11/22/2016   Blurry vision, bilateral 10/26/2016   COPD (HCC) 07/05/2016   Exertional dyspnea 09/10/2015   Fatigue/malaise 08/19/2015   Asthma/COPD 02/12/2015   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   Chronic diastolic heart failure (HCC) 01/06/2014   Hypoxemia 01/05/2014   Essential hypertension 01/05/2014   Hypokalemia 01/05/2014   Urinary retention 01/05/2014   Hypoxia    Lumbar radiculopathy 12/31/2013   RLS (restless legs syndrome) 09/21/2012   Chronic bilateral low back pain with bilateral sciatica 06/15/2012   Abdominal pain, unspecified site 05/19/2012   Unspecified deficiency anemia 04/07/2012   Melanoma (HCC)    Non Hodgkin's lymphoma (HCC)    Periaortic lymphadenopathy 02/16/2011   Barrett's esophagus 07/09/2010   Personal history of colonic polyps 05/28/2010   History of IBS 05/28/2010   Diverticulosis of colon (without mention of hemorrhage) 05/28/2010   Arthritis involving multiple sites 05/28/2010   Wheat intolerance 05/28/2010   Chronic venous insufficiency 08/19/2009   Peripheral edema 08/19/2009   EUSTACHIAN TUBE DYSFUNCTION, BILATERAL 12/23/2008   ERECTILE DYSFUNCTION, ORGANIC 08/01/2008   Allergic rhinitis with a nonallergic component 05/20/2008   VENTRAL HERNIA 02/12/2008   PALPITATIONS 12/25/2007   LACTOSE INTOLERANCE 10/17/2007   Hyperlipidemia 09/14/2006   BPH (benign prostatic hyperplasia) 09/14/2006   OSTEOARTHRITIS 09/14/2006   HEART MURMUR, HX OF 09/14/2006    Orientation RESPIRATION BLADDER Height & Weight  Self, Place  O2 (Nasal Cannula 3 liters) Continent, External catheter (External Urinary Catheter) Weight: 185 lb 10 oz  (84.2 kg) Height:  5\' 3"  (160 cm)  BEHAVIORAL SYMPTOMS/MOOD NEUROLOGICAL BOWEL NUTRITION STATUS      Continent Diet (Please see discharge summary)  AMBULATORY STATUS COMMUNICATION OF NEEDS Skin   Extensive Assist Verbally Other (Comment) (Appropriate for ethnicity,dry,Ecchymosis,arm,Leg,Buttocks,Bil.,Erythema,Arm,Buttocks,Leg,Bil.,Wound/Incision LDAs)                       Personal Care Assistance Level of Assistance  Bathing, Feeding, Dressing Bathing Assistance: Maximum assistance Feeding assistance: Limited assistance Dressing Assistance: Maximum assistance     Functional Limitations Info  Sight, Hearing, Speech Sight Info: Impaired Hearing Info: Impaired Speech Info: Adequate    SPECIAL CARE FACTORS FREQUENCY  PT (By licensed PT), OT (By licensed OT)     PT Frequency: 5x min weekly OT Frequency: 5x min weekly            Contractures Contractures Info: Not present    Additional Factors Info  Code Status, Allergies, Psychotropic Code Status Info: DNR Allergies Info: Pollen Extract-tree Extract,Feraheme (ferumoxytol),Molds [employer] Smuts Psychotropic Info: buprenorphine (BUTRANS) 5 MCG/HR 1 patch,weekly,escitalopram (LEXAPRO) tablet 10 mg daily,gabapentin (NEURONTIN) capsule 100 mg 2 times daily         Current Medications (08/24/2022):  This is the current hospital active medication list Current Facility-Administered Medications  Medication Dose Route Frequency Provider Last Rate Last Admin   acetaminophen (TYLENOL) tablet 650 mg  650 mg Oral Q6H PRN Atway, Rayann N, DO   650 mg at 08/24/22 1532   Or   acetaminophen (TYLENOL) suppository 650 mg  650 mg Rectal Q6H PRN Atway, Rayann N, DO       apixaban (ELIQUIS) tablet 2.5 mg  2.5 mg Oral BID Atway, Rayann N, DO   2.5 mg at 08/24/22 0826   atorvastatin (LIPITOR) tablet 40 mg  40 mg Oral q1800 Atway, Rayann N, DO   40 mg at 08/24/22 1546   buprenorphine (BUTRANS) 5 MCG/HR 1 patch  1 patch Transdermal Weekly Atway,  Rayann N, DO   1 patch at 08/23/22 1420   calcium carbonate (OS-CAL - dosed in mg of elemental calcium) tablet 1,250 mg  1 tablet Oral TID WC Dickie La, MD   1,250 mg at 08/24/22 1532   escitalopram (LEXAPRO) tablet 10 mg  10 mg Oral Daily Dickie La, MD   10 mg at 08/24/22 4098   furosemide (LASIX) 200 mg in dextrose 5 % 100 mL (2 mg/mL) infusion  4 mg/hr Intravenous Continuous Atway, Rayann N, DO 2 mL/hr at 08/23/22 1721 4 mg/hr at 08/23/22 1721   gabapentin (NEURONTIN) capsule 100 mg  100 mg Oral BID Atway, Rayann N, DO   100 mg at 08/24/22 1191   hydrOXYzine (ATARAX) tablet 10 mg  10 mg Oral TID PRN Atway, Rayann N, DO   10 mg at 08/23/22 1835   menthol-cetylpyridinium (CEPACOL) lozenge 3 mg  1 lozenge Oral PRN Hillary Bow, DO   3 mg at 08/23/22 0559   midodrine (PROAMATINE) tablet 10 mg  10 mg Oral TID WC Loetta Rough, MD   10 mg at 08/24/22 1532   oxyCODONE (Oxy IR/ROXICODONE) immediate release tablet 5 mg  5 mg Oral BID PRN Atway, Rayann N, DO   5 mg at 08/24/22 0826   pantoprazole (PROTONIX) EC tablet 40 mg  40 mg Oral Daily Atway, Rayann N, DO   40 mg at 08/24/22  0825   Riociguat TABS 2.5 mg  2.5 mg Oral TID Silvana Newness, RPH   2.5 mg at 08/24/22 1545   rOPINIRole (REQUIP) tablet 2 mg  2 mg Oral TID Atway, Rayann N, DO   2 mg at 08/24/22 1532   Selexipag TABS 1,600 mcg  1 tablet Oral 2 times per day Silvana Newness [facility]       And   Selexipag TABS 400 mcg  400 mcg Oral 2 times per day Silvana Newness, RPH       senna-docusate (Senokot-S) tablet 1 tablet  1 tablet Oral QHS PRN Atway, Rayann N, DO   1 tablet at 08/23/22 0038     Discharge Medications: Please see discharge summary for a list of discharge medications.  Relevant Imaging Results:  Relevant Lab Results:   Additional Information [id number]  Delilah Shan, LCSWA

## 2022-08-24 NOTE — TOC Progression Note (Addendum)
Transition of Care [facility]) - Progression Note    Patient Details  Name: Ryan [name] MRN: [id number] Date of Birth: [birth year]  [facility] CM/SW Contact  Delilah Shan, LCSWA Phone Number: 08/24/2022, 1:23 PM  Clinical Narrative:     CSW received consult for possible SNF placement at time of discharge.Due to patients current orientation CSW LVM for patients spouse Erskine Squibb. CSW awaiting call back to discuss  PT recommendations for SNF placement for patient. CSW to continue to follow and assist with discharge planning needs.   CSW received callback from patients spouse Erskine Squibb regarding PT recommendation of SNF placement at time of discharge. Patients spouse reports PTA patient comes from [facility].Patients spouse expressed understanding of PT recommendation and is agreeable to SNF placement at time of discharge for patient. Patients spouse gave CSW permission to fax initial referral for SNF placement for patient.CSW discussed insurance authorization process. No further questions reported at this time. CSW to continue to follow and assist with discharge planning needs.   Expected Discharge Plan: Assisted Living Barriers to Discharge: Continued Medical Work up  Expected Discharge Plan and Services       Living arrangements for the past 2 months: Assisted Living Facility                                       Social Determinants of Health (SDOH) Interventions SDOH Screenings   Food Insecurity: No Food Insecurity (08/23/2022)  Housing: Patient Declined (08/23/2022)  Transportation Needs: No Transportation Needs (08/23/2022)  Utilities: Not At Risk (08/23/2022)  Financial Resource Strain: Low Risk  (02/08/2022)  Social Connections: Unknown (06/09/2021)   Received from [facility]  Tobacco Use: Medium Risk (08/26/2022)    Readmission Risk Interventions     No data to display

## 2022-08-24 NOTE — Progress Notes (Signed)
Mobility Specialist Progress Note:    08/24/22 1636  Mobility  Activity Transferred to/from [facility]  Level of Assistance +2 (takes two people) Audiological scientist)  Musician  Activity Response Tolerated well  Mobility Referral Yes  $Mobility charge 1 Mobility  Mobility Specialist Start Time (ACUTE ONLY) 1446  Mobility Specialist Stop Time (ACUTE ONLY) 1503  Mobility Specialist Time Calculation (min) (ACUTE ONLY) 17 min   Pt received on BSC, requesting to get back to bed. Pt needed MinA +2 to stand and for safety. He was able to take a few steps towards the bed. To return to supine pt also needed that MinA +2 d/t fatigue. Pt situated in bed w/ call bell and personal belongings in reach. NT in room.  Ryan [name] Mobility Specialist  Please contact vis Secure Chat or  Rehab Office [phone]

## 2022-08-24 NOTE — Progress Notes (Signed)
   Patient Name: Ryan [name] Date of Encounter: 08/24/2022 [facility] Cardiologist: Nanetta Batty, MD  Interval Summary  .    Anxious.  Breathing improved.  Wife in room.  She lives at home, he lives at [facility].  States that over the last 5 to 7 years have tried to encourage the dining facility to endorse a low sodium option but this has not occurred.  She comes in today for with expedited medications for his pulmonary hypertension that she states are very expensive, recently shipped from [place].  Vital Signs .    Vitals:   08/24/22 0400 08/24/22 0805 08/24/22 0831 08/24/22 0900  BP:  120/68    Pulse:      Resp:  18    Temp:  97.7 F (36.5 C)    TempSrc:  Oral    SpO2: 100%  (!) 83% 95%  Weight:      Height:        Intake/Output Summary (Last 24 hours) at 08/24/2022 1059 Last data filed at 08/24/2022 0830 Gross per 24 hour  Intake 737.04 ml  Output 7850 ml  Net -7112.96 ml      08/24/2022    3:44 AM 08/23/2022    4:20 AM 08/21/2022    9:00 PM  Last 3 Weights  Weight (lbs) 185 lb 10 oz 194 lb 3.6 oz 188 lb  Weight (kg) 84.2 kg 88.1 kg 85.276 kg      Telemetry/ECG    Brief episodes of atrial tachycardia.- Personally Reviewed  Physical Exam .   GEN: No acute distress.   Neck: No JVD Cardiac: RRR, soft systolic murmur,no rubs, or gallops.  Respiratory: Clear to auscultation bilaterally. GI: Soft, nontender, non-distended  MS: No edema  Assessment & Plan .     89 year old with chronic end-stage diastolic heart failure with multiple readmissions for anasarca, lymphedema, pulmonary hypertension, stage IV chronic kidney disease on midodrine for blood pressure support.  Acute on chronic diastolic heart failure - IV Lasix drip 4 mg/h.  Excellent diuresis, 8.5 L out yesterday, 9.5 L out the day before.  Blood pressure currently 120/68.  Creatinine decreasing to 1.8 down from 2.04 suggestive of improved cardiac output after moving to the left of  the Starling curve.  Continue close monitoring of electrolytes as well as creatinine.  Hypokalemia - A good overall improvement.  Went from 3.2-3.5 back down to 3.0.  Continue with aggressive repletion given heavy IV diuresis.  Hyponatremia - Good overall improvement secondary to free water excretion due to heart failure exacerbation.  IV Lasix has helped.  He is not increasing at too fast of a rate.  Currently 135.  Paroxysmal atrial fibrillation - On Eliquis 2.5 mg twice daily dose adjusted.  Pulmonary hypertension - On 2 separate medications which she is wife has in hand.  She states that he needs to have these so that he does not go greater than 3 days without.  Goals of care - Currently DNR.  Would encourage continued discussions with palliative care team.  Given his multiple readmissions and current multisystem organ failure ongoing, it would be wise to continue to encourage palliation/comfort.   For questions or updates, please contact [facility] Please consult [email or web address] for contact info under        Signed, Donato Schultz, MD

## 2022-08-24 NOTE — Progress Notes (Cosign Needed Addendum)
RE: Ryan [name]. [place]  Date of Birth: [birth year]  Date: 08/24/2022  To Whom It May Concern:  Please be advised that the above-named patient will require a short-term nursing home stay - anticipated 30 days or less for rehabilitation and strengthening. The plan is for return home.

## 2022-08-25 ENCOUNTER — Inpatient Hospital Stay (HOSPITAL_COMMUNITY): Payer: Medicare HMO

## 2022-08-25 DIAGNOSIS — N184 Chronic kidney disease, stage 4 (severe): Secondary | ICD-10-CM | POA: Diagnosis not present

## 2022-08-25 DIAGNOSIS — N179 Acute kidney failure, unspecified: Secondary | ICD-10-CM | POA: Diagnosis not present

## 2022-08-25 DIAGNOSIS — I5033 Acute on chronic diastolic (congestive) heart failure: Secondary | ICD-10-CM | POA: Diagnosis not present

## 2022-08-25 DIAGNOSIS — J9621 Acute and chronic respiratory failure with hypoxia: Secondary | ICD-10-CM | POA: Diagnosis not present

## 2022-08-25 LAB — HEPATIC FUNCTION PANEL
ALT: 10 U/L (ref 0–44)
AST: 12 U/L — ABNORMAL LOW (ref 15–41)
Albumin: 2.8 g/dL — ABNORMAL LOW (ref 3.5–5.0)
Alkaline Phosphatase: 90 U/L (ref 38–126)
Bilirubin, Direct: <0.1 mg/dL (ref 0.0–0.2)
Total Bilirubin: 0.7 mg/dL (ref 0.3–1.2)
Total Protein: 6.5 g/dL (ref 6.5–8.1)

## 2022-08-25 LAB — BASIC METABOLIC PANEL
Anion gap: 15 (ref 5–15)
Anion gap: 14 (ref 5–15)
Anion gap: 16 — ABNORMAL HIGH (ref 5–15)
BUN: 103 mg/dL — ABNORMAL HIGH (ref 8–23)
BUN: 101 mg/dL — ABNORMAL HIGH (ref 8–23)
BUN: 104 mg/dL — ABNORMAL HIGH (ref 8–23)
CO2: 34 mmol/L — ABNORMAL HIGH (ref 22–32)
CO2: 35 mmol/L — ABNORMAL HIGH (ref 22–32)
CO2: 33 mmol/L — ABNORMAL HIGH (ref 22–32)
Calcium: 7.2 mg/dL — ABNORMAL LOW (ref 8.9–10.3)
Calcium: 7.2 mg/dL — ABNORMAL LOW (ref 8.9–10.3)
Calcium: 7.4 mg/dL — ABNORMAL LOW (ref 8.9–10.3)
Chloride: 85 mmol/L — ABNORMAL LOW (ref 98–111)
Chloride: 85 mmol/L — ABNORMAL LOW (ref 98–111)
Chloride: 84 mmol/L — ABNORMAL LOW (ref 98–111)
Creatinine, Ser: 1.89 mg/dL — ABNORMAL HIGH (ref 0.61–1.24)
Creatinine, Ser: 1.87 mg/dL — ABNORMAL HIGH (ref 0.61–1.24)
Creatinine, Ser: 1.9 mg/dL — ABNORMAL HIGH (ref 0.61–1.24)
GFR, Estimated: 34 mL/min — ABNORMAL LOW (ref >60)
GFR, Estimated: 34 mL/min — ABNORMAL LOW (ref >60)
GFR, Estimated: 34 mL/min — ABNORMAL LOW (ref >60)
Glucose, Bld: 122 mg/dL — ABNORMAL HIGH (ref 70–99)
Glucose, Bld: 108 mg/dL — ABNORMAL HIGH (ref 70–99)
Glucose, Bld: 144 mg/dL — ABNORMAL HIGH (ref 70–99)
Potassium: 3.8 mmol/L (ref 3.5–5.1)
Potassium: 3.5 mmol/L (ref 3.5–5.1)
Potassium: 3.6 mmol/L (ref 3.5–5.1)
Sodium: 134 mmol/L — ABNORMAL LOW (ref 135–145)
Sodium: 134 mmol/L — ABNORMAL LOW (ref 135–145)
Sodium: 133 mmol/L — ABNORMAL LOW (ref 135–145)

## 2022-08-25 LAB — CBC
HCT: 29.4 % — ABNORMAL LOW (ref 39.0–52.0)
Hemoglobin: 8.8 g/dL — ABNORMAL LOW (ref 13.0–17.0)
MCH: 24.3 pg — ABNORMAL LOW (ref 26.0–34.0)
MCHC: 29.9 g/dL — ABNORMAL LOW (ref 30.0–36.0)
MCV: 81.2 fL (ref 80.0–100.0)
Platelets: 329 10*3/uL (ref 150–400)
RBC: 3.62 MIL/uL — ABNORMAL LOW (ref 4.22–5.81)
RDW: 16.7 % — ABNORMAL HIGH (ref 11.5–15.5)
WBC: 10.6 10*3/uL — ABNORMAL HIGH (ref 4.0–10.5)
nRBC: 0 % (ref 0.0–0.2)

## 2022-08-25 LAB — MAGNESIUM: Magnesium: 1.9 mg/dL (ref 1.7–2.4)

## 2022-08-25 MED ORDER — IPRATROPIUM-ALBUTEROL 0.5-2.5 (3) MG/3ML IN SOLN: 3.0000 mL | Freq: Once | RESPIRATORY_TRACT | Status: DC

## 2022-08-25 MED ORDER — LEVALBUTEROL HCL 0.63 MG/3ML IN NEBU
0.6300 mg | INHALATION_SOLUTION | Freq: Once | RESPIRATORY_TRACT | Status: AC
  Administered 2022-08-25: 0.63 mg via RESPIRATORY_TRACT
  Filled 2022-08-25: qty 3

## 2022-08-25 NOTE — Progress Notes (Addendum)
Paged by RN to bedside for increased oxygen requirement up to 14L HFNC and HR to 120.  S:When IMTS on-call team arrived to bedside, patient was sitting up in bed, purse lip breathing. He was speaking in full sentences and reporting improvement of his earlier symptoms, however, was still having some difficulty breathing. Per patient and RN at bedside, patient had choked on broccoli he has eating a couple of hours prior. Since about 4PM, patient had been coughing intermittently and felt short of breath. Patient denies chest pain, changes in vision, nausea or vomiting. He endorsed feeling nervous since arriving at the hospital and that he has needed intermittent anxiety medication.   Vitals:  HR: 94 BP: 110/65 RR 18 SPO2: 100% on 6L [place] I/O: No urine output documented in the past 24 hours and no urine in the foley bag at present Telemetry review: Atrial fibrillation with rates in 90-111  On exam, this pleasant elderly man was in mild distress, purse lip breathing.  Irregular rate without extra sounds on cardiac examination. Radial pulses palpable bilaterally. Lower extremities are warm.  Patient was in mild increased work of breathing, purse lip breathing, clear to ausculatation in the bilateral lung apices with crackles present in bilateral bases. Expiratory wheezing heard throughout No tenderness to palpation of the abdomen Bilateral 2+ LE edema Pleasant mood and anxious affect  CXR: Interstitial pulmonary edema with interval worsening. Patchy infiltrates in left lower lung field may suggest atelectasis/pneumonia.   A: Ryan [name] is a 89-yo with a history of end-stage diastolic heart failure, stage IV kidney disease admitted to IMTS for acute on chronic diastolic heart failure previously on 3L nasal cannula this AM now with increased supplemental O2 requirements for this acute hypoxic respiratory failure. Suspect acute change in the setting of aspiration vs recent discontinuation of lasix  drip since 3PM with interval worsening in pulmonary edema seen on XR. Patient has a history of afib and by the time of this examination heart rates were below 110. No chest pain or anginal type symptoms on exam today. LLL field with findings suggestive of PNA, however, given the rapid onset of symptoms, do not favor this to be the culprit of symptoms. However, will continue to monitor and escalate treatment as outlined below.  Plan - Transition to HFNC now  - Nebulized treatment - Hydroxyzine now for anxiety - Reassess O2 saturation and WOB, attempt weaning to [place] supplemental O2 - If unsuccessful re-start previous dose of lasix drip with strict parameters to hold if MAP is <65  - Monitor fever curve and sputum production; consider aspiration pneumonia coverage - Will sign out the the ON-CALL night float for continued supervision  Morene Crocker, MD [facility] Internal Medicine Program - PGY-2 08/25/2022, 6:21 PM Please contact the on call pager  [phone].

## 2022-08-25 NOTE — Progress Notes (Addendum)
HD#2 SUBJECTIVE:  Patient Summary: Ryan [name] is a 89 y.o. with a pertinent PMH of pulmonary hypertension, decompensated congestive heart failure, atrial fibrillation, and hypertension who presented with worsening hypoxia and admitted for acute on chronic respiratory failure.   Overnight Events: No acute events overnight  Interim History: Patient was evaluated at bedside.  Continues to endorse improvement in breathing.  Denies shortness of breath or chest pain at rest.  Noted to have multiple episodes of diarrhea over the night.  Bilateral lower extremity are less painful.  OBJECTIVE:  Vital Signs: Vitals:   08/25/22 0000 08/25/22 0356 08/25/22 0400 08/25/22 0827  BP:   (!) 95/50 (!) 96/54  Pulse:  87 85   Resp:  16 17   Temp:  98.3 F (36.8 C) 98 F (36.7 C) 97.7 F (36.5 C)  TempSrc:  Oral Oral Oral  SpO2: 94% 97% 98%   Weight:  83.1 kg    Height:       Supplemental O2: Nasal Cannula SpO2: 98 % O2 Flow Rate (L/min): 3 L/min  Filed Weights   08/23/22 0420 08/24/22 0344 08/25/22 0356  Weight: 88.1 kg 84.2 kg 83.1 kg    Intake/Output Summary (Last 24 hours) at 08/25/2022 1329 Last data filed at 08/25/2022 0403 Gross per 24 hour  Intake --  Output 400 ml  Net -400 ml   Net IO Since Admission: -16,721.26 mL [08/25/22 1329]  CBC    Component Value Date/Time   WBC 10.6 (H) 08/25/2022 0640   RBC 3.62 (L) 08/25/2022 0640   HGB 8.8 (L) 08/25/2022 0640   HGB 9.7 (L) 12/10/2019 1007   HGB 14.2 06/26/2015 0916   HCT 29.4 (L) 08/25/2022 0640   HCT 33.2 (L) 12/10/2019 1007   HCT 42.9 06/26/2015 0916   PLT 329 08/25/2022 0640   PLT 328 12/10/2019 1007   MCV 81.2 08/25/2022 0640   MCV 85 12/10/2019 1007   MCV 91.8 06/26/2015 0916   MCH 24.3 (L) 08/25/2022 0640   MCHC 29.9 (L) 08/25/2022 0640   RDW 16.7 (H) 08/25/2022 0640   RDW 15.8 (H) 12/10/2019 1007   RDW 15.0 (H) 06/26/2015 0916   LYMPHSABS 0.6 (L) 08/21/2022 1314   LYMPHSABS 1.5 12/10/2019 1007    LYMPHSABS 1.4 06/26/2015 0916   MONOABS 0.7 08/15/2022 1314   MONOABS 0.6 06/26/2015 0916   EOSABS 0.2 08/17/2022 1314   EOSABS 0.3 12/10/2019 1007   BASOSABS 0.0 08/28/2022 1314   BASOSABS 0.0 12/10/2019 1007   BASOSABS 0.0 06/26/2015 0916   CMP    Latest Ref Rng & Units 08/25/2022    6:40 AM 08/25/2022    3:41 AM 08/24/2022   10:15 PM  CMP  Glucose 70 - 99 mg/dL 578  469  629   BUN 8 - 23 mg/dL 528  413  244   Creatinine 0.61 - 1.24 mg/dL 0.10  2.72  5.36   Sodium 135 - 145 mmol/L 134  134  136   Potassium 3.5 - 5.1 mmol/L 3.5  3.8  3.9   Chloride 98 - 111 mmol/L 85  85  84   CO2 22 - 32 mmol/L 35  34  36   Calcium 8.9 - 10.3 mg/dL 7.2  7.2  7.2   Total Protein 6.5 - 8.1 g/dL 6.5     Total Bilirubin 0.3 - 1.2 mg/dL 0.7     Alkaline Phos 38 - 126 U/L 90     AST 15 - 41 U/L 12  ALT 0 - 44 U/L 10       Physical Exam Constitutional:      Appearance: Normal appearance. He is well-developed.  HENT:     Head: Normocephalic and atraumatic.  Cardiovascular:     Rate and Rhythm: Normal rate and regular rhythm.  Musculoskeletal:     Right lower leg: Edema present.     Left lower leg: Edema present.  Neurological:     General: No focal deficit present.     Mental Status: He is alert.  Psychiatric:        Mood and Affect: Mood and affect normal.    ASSESSMENT/PLAN:  Assessment: Principal Problem:   Acute on chronic hypoxic respiratory failure (HCC) Active Problems:   Hypocalcemia  Plan: # Acute on chronic diastolic congestive heart failure #Shortness of breath -16L down since admission. Cards following. Still on on 3 L [place] but will attempt to wean to home amount of 2 L today. Remains volume overloaded and will continue Lasix drip per cardiology.  Palliative will meet with wife and patient today or tomorrow. - Continue Lasix gtt - Attempt to wean oxygen to 2L  - Palliative consulted - Continue with midodrine for blood pressure support - Continue PT/OT    #Hypocalcemia Calcium on admission was 6.2 that is likely secondary to CKD.  Continuing to receive oral calcium 3 times daily.  Corrected calcium this AM 8.2.  Hepatic function panel decreased albumin of 2.8. - Continue PO calcium TID - Trend BMP and replenish as needed  #Hypokalemia, resolved #Hypomagnesia, resolved Admitted with a potassium level of 3.4.  This morning, his potassium was 3.5 and continues to remain stable.  Once Lasix gtt stopped, only need daily labs.  - Replenish as needed - Repeat BMP every 8 hours  #Hyponatremia, resolved Admitted with a sodium of 126. This morning, sodium stable at 134. - Continue to closely monitor BMP  #Acute on chronic kidney disease Currently in CKD stage IV. His baseline creatinine is 2.28.  This morning, his creatinine is stable at 1.89. - Monitor function and continue to trend labs  #Atrial fibrillation with RVR His heart rate remains between 70 and 90.  Denies any associated s/s. - Continue Eliquis  #Anemia of chronic disease CBC from this morning is pending but no signs or symptoms of bleeding - Daily CBC  #Pulmonary hypertension #Interstitial lung disease He follows with Dr. Shirlee Latch. He is not reporting worsening cough, fever. He had chest x-ray, concerning for interstitial edema. - Continue Riociguat 2.5 mg TID - Continue Selexipag - Continue UPTRAVI  #History of hyperlipidemia -Lipitor 40 mg . #History of depression -Lexapro 10 mg   #History of restless syndrome -Ropinorole to 2 mg TID   #History of chronic hypotension -Continue midodrine 10 mg TID   #History of BPH -Finasteride 5 mg    #History of GERD -Omeprazole 20 mg twice daily -Zofran   #History of insomnia -Trazodone at bedtime.    #History of low back pain Follows with pain med specialist.  -Buprenorphine 5 mcg/hr weekly patch -Gabapentin 100 mg 2 times daily.   #History of constipation -Continue senna-docusate as needed  Best  Practice: Diet: Cardiac diet VTE: apixaban (ELIQUIS) tablet 2.5 mg Start: 08/17/2022 2200 Code: DNR DISPO: Anticipated discharge to  SNF  pending  resolution of symptoms .  Signature: Morrie Sheldon, MD Internal Medicine Resident, PGY-1 [facility]  Pager: [phone]  Please contact the on call pager after 5 pm and on weekends at  [phone].  

## 2022-08-25 NOTE — TOC Progression Note (Addendum)
Transition of Care [facility]) - Progression Note    Patient Details  Name: Ryan [name] MRN: [id number] Date of Birth: 1931-03-01  Transition of Care [facility]) CM/SW Contact  Delilah Shan, LCSWA Phone Number: 08/25/2022, 1:02 PM  Clinical Narrative:     CSW spoke with Erskine Squibb patients spouse and provided SNF bed offers. Patients spouse accepted SNF bed offer with [facility] in [place]. CSW spoke with Tresa Endo with [facility] who confirmed SNF bed offer. Clinicals sent to [place] must for passr. Passr now approved. Passr number [id number] E added to patients FL2. CSW will continue to follow. [employer] authorization has been approved for SNF . [id number] 7/25 - 8/6    Expected Discharge Plan: Skilled Nursing Facility Barriers to Discharge: Continued Medical Work up  Expected Discharge Plan and Services In-house Referral: Clinical Social Work     Living arrangements for the past 2 months: Independent Living Facility ([facility])                                       Social Determinants of Health (SDOH) Interventions SDOH Screenings   Food Insecurity: No Food Insecurity (08/23/2022)  Housing: Patient Declined (08/23/2022)  Transportation Needs: No Transportation Needs (08/23/2022)  Utilities: Not At Risk (08/23/2022)  Financial Resource Strain: Low Risk  (02/08/2022)  Social Connections: Unknown (06/09/2021)   Received from [facility]  Tobacco Use: Medium Risk (08/17/2022)    Readmission Risk Interventions     No data to display

## 2022-08-25 NOTE — Progress Notes (Signed)
Patient called out stated that he felt like he was choking. This nurse went into the room to assess patient. Patient was breathing heavy and in between the breathes he was coughing heavily. His O2 sats dropped to 79, place patient on non re-breather. Patient's sats came up to 100%. Placed the patient back on 6L and patient's sats were 92%. Patient pursed lip breathing. Notified MD. Md on the way up to bedside.

## 2022-08-25 NOTE — Progress Notes (Signed)
   08/25/22 2000  Urine Characteristics  Bladder Scan Volume (mL) 904 mL

## 2022-08-25 NOTE — Progress Notes (Signed)
   Patient Name: Ryan [name] Date of Encounter: 08/25/2022 [facility] Cardiologist: Nanetta Batty, MD Dr. Shirlee Latch  Interval Summary  .    Spoke to wife yesterday who brought in pulmonary hypertension medications which were just shifting.  He is still anxious.  States that he is weak, blind   Vital Signs .    Vitals:   08/25/22 0000 08/25/22 0356 08/25/22 0400 08/25/22 0827  BP:   (!) 95/50 (!) 96/54  Pulse:  87 85   Resp:  16 17   Temp:  98.3 F (36.8 C) 98 F (36.7 C) 97.7 F (36.5 C)  TempSrc:  Oral Oral Oral  SpO2: 94% 97% 98%   Weight:  83.1 kg    Height:        Intake/Output Summary (Last 24 hours) at 08/25/2022 0949 Last data filed at 08/25/2022 0403 Gross per 24 hour  Intake 200 ml  Output 400 ml  Net -200 ml      08/25/2022    3:56 AM 08/24/2022    3:44 AM 08/23/2022    4:20 AM  Last 3 Weights  Weight (lbs) 183 lb 3.2 oz 185 lb 10 oz 194 lb 3.6 oz  Weight (kg) 83.1 kg 84.2 kg 88.1 kg      Telemetry/ECG    Brief episodes of atrial tachycardia noted- Personally Reviewed  Physical Exam .   GEN: No acute distress.   Neck: No JVD Cardiac: RRR, soft systolic murmur,no rubs, or gallops.  Respiratory: Clear to auscultation bilaterally. GI: Soft, nontender, non-distended  MS: No edema  Assessment & Plan .     89 year old lives at [facility] with end-stage diastolic heart failure multiple readmissions for anasarca lymphedema pulmonary pretension stage IV chronic kidney disease blindness on midodrine for blood pressure support  Acute on chronic diastolic heart failure - Creatinine 1.87 this morning potassium 3.5.  Excellent repletion. - Continue with IV Lasix drip 4 mg/h.  Blood pressure soft but tolerating. -Midodrine for support  Hypokalemia - Improved  Hyponatremia - Improved with diuresis removal of free water  Paroxysmal atrial fibrillation - On Eliquis 2.5 twice a day  Pulm hypertension - 2 separate medications wife brought in  yesterday.  Goals of care - Currently DNR.  Ongoing discussions for further palliation.  Goal should be comfort driven.  For questions or updates, please contact  [facility] Please consult [email or web address] for contact info under        Signed, Donato Schultz, MD

## 2022-08-25 NOTE — Progress Notes (Signed)
Palliative-   Consult received. Chart reviewed.  Noted per consult order patient's spouse requested to meet on Thursday.  Attempted to call both of spouse's listed phone numbers to schedule meeting time but voicemail was full for mobile number and number not in service for home number.  On chart review patient has MOST form in place with following selections:    Cardiopulmonary Resuscitation: Do Not Attempt Resuscitation (DNR/No CPR)  Medical Interventions: Limited Additional Interventions: Use medical treatment, IV fluids and cardiac monitoring as indicated, DO NOT USE intubation or mechanical ventilation. May consider use of less invasive airway support such as BiPAP or CPAP. Also provide comfort measures. Transfer to the hospital if indicated. Avoid intensive care.   Antibiotics: Antibiotics if indicated  IV Fluids: IV fluids if indicated  Feeding Tube: Feeding tube for a defined trial period     Noted patient has SNF bed approved. If patient is stable for discharge prior to completion of Palliative consult recommend review of MOST form with patient and spouse by primary team and outpatient Palliative referral.   PMT will continue to attempt to schedule meeting with spouse.   Ocie Bob, AGNP-C Palliative Medicine  No charge

## 2022-08-26 DIAGNOSIS — J9621 Acute and chronic respiratory failure with hypoxia: Secondary | ICD-10-CM

## 2022-08-26 DIAGNOSIS — I959 Hypotension, unspecified: Secondary | ICD-10-CM

## 2022-08-26 DIAGNOSIS — I270 Primary pulmonary hypertension: Secondary | ICD-10-CM | POA: Diagnosis not present

## 2022-08-26 DIAGNOSIS — N179 Acute kidney failure, unspecified: Secondary | ICD-10-CM | POA: Diagnosis not present

## 2022-08-26 DIAGNOSIS — I5033 Acute on chronic diastolic (congestive) heart failure: Secondary | ICD-10-CM | POA: Diagnosis not present

## 2022-08-26 DIAGNOSIS — N184 Chronic kidney disease, stage 4 (severe): Secondary | ICD-10-CM

## 2022-08-26 DIAGNOSIS — Z7189 Other specified counseling: Secondary | ICD-10-CM

## 2022-08-26 DIAGNOSIS — L8990 Pressure ulcer of unspecified site, unspecified stage: Secondary | ICD-10-CM | POA: Insufficient documentation

## 2022-08-26 LAB — BASIC METABOLIC PANEL
Anion gap: 13 (ref 5–15)
Anion gap: 15 (ref 5–15)
BUN: 114 mg/dL — ABNORMAL HIGH (ref 8–23)
BUN: 99 mg/dL — ABNORMAL HIGH (ref 8–23)
CO2: 30 mmol/L (ref 22–32)
CO2: 33 mmol/L — ABNORMAL HIGH (ref 22–32)
Calcium: 7.8 mg/dL — ABNORMAL LOW (ref 8.9–10.3)
Calcium: 8.5 mg/dL — ABNORMAL LOW (ref 8.9–10.3)
Chloride: 87 mmol/L — ABNORMAL LOW (ref 98–111)
Chloride: 89 mmol/L — ABNORMAL LOW (ref 98–111)
Creatinine, Ser: 1.99 mg/dL — ABNORMAL HIGH (ref 0.61–1.24)
Creatinine, Ser: 2.15 mg/dL — ABNORMAL HIGH (ref 0.61–1.24)
GFR, Estimated: 29 mL/min — ABNORMAL LOW (ref >60)
GFR, Estimated: 32 mL/min — ABNORMAL LOW (ref >60)
Glucose, Bld: 131 mg/dL — ABNORMAL HIGH (ref 70–99)
Glucose, Bld: 137 mg/dL — ABNORMAL HIGH (ref 70–99)
Potassium: 3.2 mmol/L — ABNORMAL LOW (ref 3.5–5.1)
Potassium: 5.4 mmol/L — ABNORMAL HIGH (ref 3.5–5.1)
Sodium: 133 mmol/L — ABNORMAL LOW (ref 135–145)
Sodium: 134 mmol/L — ABNORMAL LOW (ref 135–145)

## 2022-08-26 LAB — COMPREHENSIVE METABOLIC PANEL
ALT: 9 U/L (ref 0–44)
AST: 12 U/L — ABNORMAL LOW (ref 15–41)
Albumin: 2.7 g/dL — ABNORMAL LOW (ref 3.5–5.0)
Alkaline Phosphatase: 79 U/L (ref 38–126)
Anion gap: 17 — ABNORMAL HIGH (ref 5–15)
BUN: 104 mg/dL — ABNORMAL HIGH (ref 8–23)
CO2: 31 mmol/L (ref 22–32)
Calcium: 8.3 mg/dL — ABNORMAL LOW (ref 8.9–10.3)
Chloride: 84 mmol/L — ABNORMAL LOW (ref 98–111)
Creatinine, Ser: 2.14 mg/dL — ABNORMAL HIGH (ref 0.61–1.24)
GFR, Estimated: 29 mL/min — ABNORMAL LOW (ref >60)
Glucose, Bld: 143 mg/dL — ABNORMAL HIGH (ref 70–99)
Potassium: 3.4 mmol/L — ABNORMAL LOW (ref 3.5–5.1)
Sodium: 132 mmol/L — ABNORMAL LOW (ref 135–145)
Total Bilirubin: 0.6 mg/dL (ref 0.3–1.2)
Total Protein: 6.2 g/dL — ABNORMAL LOW (ref 6.5–8.1)

## 2022-08-26 LAB — CBC
HCT: 29.1 % — ABNORMAL LOW (ref 39.0–52.0)
Hemoglobin: 8.7 g/dL — ABNORMAL LOW (ref 13.0–17.0)
MCH: 24.1 pg — ABNORMAL LOW (ref 26.0–34.0)
MCHC: 29.9 g/dL — ABNORMAL LOW (ref 30.0–36.0)
MCV: 80.6 fL (ref 80.0–100.0)
Platelets: 327 10*3/uL (ref 150–400)
RBC: 3.61 MIL/uL — ABNORMAL LOW (ref 4.22–5.81)
RDW: 16.4 % — ABNORMAL HIGH (ref 11.5–15.5)
WBC: 11.7 10*3/uL — ABNORMAL HIGH (ref 4.0–10.5)
nRBC: 0 % (ref 0.0–0.2)

## 2022-08-26 LAB — LACTIC ACID, PLASMA: Lactic Acid, Venous: 0.8 mmol/L (ref 0.5–1.9)

## 2022-08-26 MED ORDER — OXYCODONE HCL 20 MG/ML PO CONC
5.0000 mg | ORAL | Status: DC | PRN
  Administered 2022-08-26 – 2022-08-27 (×2): 5 mg via ORAL
  Filled 2022-08-26 (×2): qty 0.5

## 2022-08-26 MED ORDER — FUROSEMIDE 10 MG/ML IJ SOLN
4.0000 mg/h | INTRAVENOUS | Status: DC
  Filled 2022-08-26: qty 20

## 2022-08-26 MED ORDER — MIDODRINE HCL 5 MG PO TABS
10.0000 mg | ORAL_TABLET | Freq: Once | ORAL | Status: AC
  Administered 2022-08-26: 10 mg via ORAL
  Filled 2022-08-26: qty 2

## 2022-08-26 MED ORDER — MIDODRINE HCL 5 MG PO TABS
20.0000 mg | ORAL_TABLET | Freq: Three times a day (TID) | ORAL | Status: DC
  Administered 2022-08-26 – 2022-08-27 (×4): 20 mg via ORAL
  Filled 2022-08-26 (×4): qty 4

## 2022-08-26 MED ORDER — AMIODARONE HCL IN DEXTROSE 360-4.14 MG/200ML-% IV SOLN
60.0000 mg/h | INTRAVENOUS | Status: AC
  Administered 2022-08-26: 60 mg/h via INTRAVENOUS
  Filled 2022-08-26: qty 200

## 2022-08-26 MED ORDER — HALOPERIDOL LACTATE 5 MG/ML IJ SOLN
0.5000 mg | INTRAMUSCULAR | Status: DC | PRN
  Administered 2022-08-26: 0.5 mg via INTRAVENOUS
  Filled 2022-08-26: qty 1

## 2022-08-26 MED ORDER — ONDANSETRON 4 MG PO TBDP: 4.0000 mg | ORAL_TABLET | Freq: Four times a day (QID) | ORAL | Status: DC | PRN

## 2022-08-26 MED ORDER — LORAZEPAM 1 MG PO TABS
1.0000 mg | ORAL_TABLET | ORAL | Status: DC | PRN
  Administered 2022-08-26 – 2022-08-27 (×2): 1 mg via ORAL
  Filled 2022-08-26 (×2): qty 1

## 2022-08-26 MED ORDER — HALOPERIDOL LACTATE 2 MG/ML PO CONC: 0.5000 mg | ORAL | Status: DC | PRN

## 2022-08-26 MED ORDER — POLYVINYL ALCOHOL 1.4 % OP SOLN: 1.0000 [drp] | Freq: Four times a day (QID) | OPHTHALMIC | Status: DC | PRN

## 2022-08-26 MED ORDER — OXYCODONE HCL 20 MG/ML PO CONC: 5.0000 mg | ORAL | Status: DC | PRN

## 2022-08-26 MED ORDER — LORAZEPAM 2 MG/ML IJ SOLN: 1.0000 mg | INTRAMUSCULAR | Status: DC | PRN

## 2022-08-26 MED ORDER — AMIODARONE LOAD VIA INFUSION
150.0000 mg | Freq: Once | INTRAVENOUS | Status: AC
  Administered 2022-08-26: 150 mg via INTRAVENOUS
  Filled 2022-08-26: qty 83.34

## 2022-08-26 MED ORDER — HYDROMORPHONE HCL 1 MG/ML IJ SOLN
0.5000 mg | INTRAMUSCULAR | Status: DC | PRN
  Administered 2022-08-27 (×2): 0.5 mg via INTRAVENOUS
  Filled 2022-08-26: qty 1

## 2022-08-26 MED ORDER — POTASSIUM CHLORIDE 10 MEQ/100ML IV SOLN
10.0000 meq | INTRAVENOUS | Status: DC
  Administered 2022-08-26 (×3): 10 meq via INTRAVENOUS
  Filled 2022-08-26 (×4): qty 100

## 2022-08-26 MED ORDER — GLYCOPYRROLATE 0.2 MG/ML IJ SOLN: 0.2000 mg | INTRAMUSCULAR | Status: DC | PRN

## 2022-08-26 MED ORDER — POTASSIUM CHLORIDE 10 MEQ/100ML IV SOLN
10.0000 meq | Freq: Once | INTRAVENOUS | Status: AC
  Administered 2022-08-26: 10 meq via INTRAVENOUS
  Filled 2022-08-26: qty 100

## 2022-08-26 MED ORDER — ONDANSETRON HCL 4 MG/2ML IJ SOLN: 4.0000 mg | Freq: Four times a day (QID) | INTRAMUSCULAR | Status: DC | PRN

## 2022-08-26 MED ORDER — FUROSEMIDE 10 MG/ML IJ SOLN
4.0000 mg/h | INTRAVENOUS | Status: DC
  Administered 2022-08-26: 4 mg/h via INTRAVENOUS
  Filled 2022-08-26: qty 20

## 2022-08-26 MED ORDER — LORAZEPAM 2 MG/ML PO CONC: 1.0000 mg | ORAL | Status: DC | PRN

## 2022-08-26 MED ORDER — AMIODARONE HCL IN DEXTROSE 360-4.14 MG/200ML-% IV SOLN
30.0000 mg/h | INTRAVENOUS | Status: DC
  Administered 2022-08-26: 30 mg/h via INTRAVENOUS
  Filled 2022-08-26 (×4): qty 200

## 2022-08-26 MED ORDER — GLYCOPYRROLATE 1 MG PO TABS: 1.0000 mg | ORAL_TABLET | ORAL | Status: DC | PRN

## 2022-08-26 MED ORDER — HALOPERIDOL 0.5 MG PO TABS: 0.5000 mg | ORAL_TABLET | ORAL | Status: DC | PRN

## 2022-08-26 NOTE — Progress Notes (Signed)
Occupational Therapy Treatment Patient Details Name: BLISS TSANG MRN: [id number] DOB: 1931/03/08 Today's Date: 08/26/2022   History of present illness 89 yo male presents to [facility] on 7/21 with SOB, BLE edema. PMH: asthma, barrett's esophagus, CHF, COPD on 2LO2 chronically, DM2, HLD, HTN, non-Hodgkin's lymphoma, melanoma, CKDIV, afib, pulmonary HTN, ILD.   OT comments  Pt doing well at beginning of session, A/O x3, couldn't remember why he went to the hospital. Pt was able to actively participate and follow commands as needed to complete bed mobility and transfer to chair using Stedy. Good power with sit to stand, min guard from elevated surface, reliant on Stedy for support. Pt O2 removed briefly and desat to 86, unable to recover without O2 replaced, back to 95 within 1 minute. Pt required total A for personal hygiene while standing using Stedy. At end of session Pt became confused and stated we were in a movie, not able to answer questions accurately. Pt would benefit from continued skilled therapy to maximize progress as able, DC plan still appropriate.    Recommendations for follow up therapy are one component of a multi-disciplinary discharge planning process, led by the attending physician.  Recommendations may be updated based on patient status, additional functional criteria and insurance authorization.    Assistance Recommended at Discharge Frequent or constant Supervision/Assistance  Patient can return home with the following  Two people to help with walking and/or transfers;A lot of help with bathing/dressing/bathroom;Assistance with cooking/housework;Direct supervision/assist for medications management;Assist for transportation;Help with stairs or ramp for entrance   Equipment Recommendations  None recommended by OT    Recommendations for Other Services      Precautions / Restrictions Precautions Precautions: Fall Precaution Comments: 10LO2 Restrictions Weight Bearing  Restrictions: No       Mobility Bed Mobility Overal bed mobility: Needs Assistance Bed Mobility: Supine to Sit     Supine to sit: Mod assist, HOB elevated     General bed mobility comments: HHA mod A supine to sit    Transfers Overall transfer level: Needs assistance Equipment used: Ambulation equipment used Antony Salmon) Transfers: Sit to/from Stand, Bed to chair/wheelchair/BSC Sit to Stand: Min guard, From elevated surface           General transfer comment: able to stand using Stedy min guard, transfered well, able to control sitting Transfer via Lift Equipment: Stedy   Balance Overall balance assessment: Needs assistance, History of Falls Sitting-balance support: No upper extremity supported, Feet supported Sitting balance-Leahy Scale: Fair Sitting balance - Comments: EOB   Standing balance support: Bilateral upper extremity supported, During functional activity, Reliant on assistive device for balance Standing balance-Leahy Scale: Poor Standing balance comment: reliant on Stedy for stability                           ADL either performed or assessed with clinical judgement   ADL Overall ADL's : Needs assistance/impaired                             Toileting- Clothing Manipulation and Hygiene: Total assistance         General ADL Comments: total for toileting hygiene    Extremity/Trunk Assessment Upper Extremity Assessment Upper Extremity Assessment: Generalized weakness   Lower Extremity Assessment Lower Extremity Assessment: Defer to PT evaluation        Vision       Perception     Praxis  Cognition Arousal/Alertness: Awake/alert Behavior During Therapy: Flat affect Overall Cognitive Status: Impaired/Different from baseline Area of Impairment: Orientation, Awareness                 Orientation Level: Disoriented to, Situation     Following Commands: Follows one step commands with increased  time Safety/Judgement: Decreased awareness of deficits     General Comments: Pt a/o x3 today, not aware of situation, able to follow commands, transferred from bed to chair using Stedy, then became more confused stating he was in a movie and we were actors.        Exercises      Shoulder Instructions       General Comments      Pertinent Vitals/ Pain       Pain Assessment Pain Assessment: No/denies pain  Home Living                                          Prior Functioning/Environment              Frequency  Min 1X/week        Progress Toward Goals  OT Goals(current goals can now be found in the care plan section)  Progress towards OT goals: Progressing toward goals  Acute Rehab OT Goals Patient Stated Goal: unable to participate in goal setting OT Goal Formulation: With patient Time For Goal Achievement: 09/07/22 Potential to Achieve Goals: Good ADL Goals Pt Will Perform Grooming: with set-up;sitting Pt Will Transfer to Toilet: with mod assist;stand pivot transfer Pt Will Perform Toileting - Clothing Manipulation and hygiene: with mod assist;sit to/from stand Pt/caregiver will Perform Home Exercise Program: Both right and left upper extremity;With theraband;Independently;With written HEP provided Additional ADL Goal #1: Pt will verbalizeat least 3 energy conservation strategies for ADL with no cues  Plan Discharge plan remains appropriate    Co-evaluation                 AM-PAC OT "6 Clicks" Daily Activity     Outcome Measure   Help from another person eating meals?: A Little Help from another person taking care of personal grooming?: A Little Help from another person toileting, which includes using toliet, bedpan, or urinal?: A Lot Help from another person bathing (including washing, rinsing, drying)?: A Lot Help from another person to put on and taking off regular upper body clothing?: A Lot Help from another person to put  on and taking off regular lower body clothing?: Total 6 Click Score: 13    End of Session Equipment Utilized During Treatment: Gait belt;Other (comment) (stedy)  OT Visit Diagnosis: Unsteadiness on feet (R26.81);Other abnormalities of gait and mobility (R26.89);Muscle weakness (generalized) (M62.81);Other symptoms and signs involving cognitive function;Pain Pain - Right/Left: Right Pain - part of body: Leg   Activity Tolerance Patient tolerated treatment well   Patient Left in chair;with call bell/phone within reach;with chair alarm set   Nurse Communication Mobility status;Need for lift equipment        Time: 1015-1055 OT Time Calculation (min): 40 min  Charges: OT General Charges $OT Visit: 1 Visit OT Treatments $Self Care/Home Management : 8-22 mins $Therapeutic Activity: 23-37 mins  Samyra Limb, OTR/L   Alexis Goodell 08/26/2022, 11:02 AM

## 2022-08-26 NOTE — Consult Note (Addendum)
NAME:  Ryan [name], MRN:  [id number], DOB:  03-22-1931, LOS: 3 ADMISSION DATE:  08/05/2022, CONSULTATION DATE: 7/25 REFERRING MD: Dr. August Saucer, CHIEF COMPLAINT: Hypotension  History of Present Illness:  89 year old male with past medical history as below, which is significant for COPD, diabetes, diastolic CHF, paroxysmal atrial fibrillation on Eliquis, pulmonary hypertension, and CKD.  He resides in skilled nursing facility.  He presented to [facility] emergency department on 7/21 with complaints of lower extremity edema and hypoxia with O2 sat of 74% on 2 L home flow.  He was admitted to the internal medicine teaching service for the treatment of acute on chronic HFpEF.  Cardiology was consulted and the patient was started on Lasix infusion.  He had a robust response to diuresis with a 16 L fluid loss in 48 hours.  He was able to wean down to 2 L of oxygen, but was still thought to be volume overloaded.  Lasix infusion was stopped on 7/24, however, the patient quickly began to worsen from a respiratory standpoint requiring oxygen to be uptitrated to 10 L/min.  The plan was to restart Lasix infusion, however, the patient began to experience borderline low blood pressures in the early a.m. hours of 7/25.  It is around that same time the patient developed A-fib with RVR and rates up to 140s.  PCCM was consulted for the consideration of ICU transfer for vasopressors.  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   Significant Hospital Events: Including procedures, antibiotic start and stop dates in addition to other pertinent events   7/21 admit for CHF exacerbation started on Lasix infusion 7/24 18 L negative, Lasix stopped and respiratory status subsequently worsened 7/25 borderline hypotension, A-fib RVR, PCCM consulted.  Interim History / Subjective:    Objective   Blood pressure (!) 88/50, pulse (!) 109, temperature 98 F (36.7 C), temperature source Axillary, resp. rate (!) 23, height 5\' 3"  (1.6 m), weight 83.1 kg, SpO2 92%.        Intake/Output Summary (Last 24 hours) at 08/26/2022 0247 Last data filed at 08/25/2022 2128 Gross per 24 hour  Intake 70.59 ml  Output 1950 ml  Net -1879.41 ml   Filed Weights   08/23/22 0420 08/24/22 0344 08/25/22 0356  Weight: 88.1 kg 84.2 kg 83.1 kg    Examination: General: Elderly appearing male in mild respiratory distress with pursed lip breathing HENT: Normocephalic, atraumatic, PERRL, unable to appreciate JVD Lungs: Coarse crackles in the bases Cardiovascular: Irregularly irregular, tachycardic, rate 140 Abdomen: Soft, nontender, nondistended Extremities: Bilateral lower extremities with erythematous with trace edema Neuro: Alert, oriented GU: External catheter  Resolved Hospital Problem list     Assessment & Plan:   Hypotension: Patient has had some borderline low blood pressures tonight.  At the time of my evaluation pressure is 110/63, but has been a bit labile.  The low pressure seem to coincide with his flip into A-fib RVR.  Nursing staff  is hesitant to restart Lasix with borderline blood pressures.  I suspect his RVR is playing a significant role in the  hypotension in the setting of pulmonary hypertension and right heart failure. -Continue to hold Lasix for now -Amiodarone bolus and infusion, he is anticoagulated -Hopefully with rate or rhythm control his pressure will improve -Patient has a MOST form which indicates to avoid intensive care and goals of care meeting planned for tomorrow.  Will attempt to avoid vasopressors and increase midodrine.  Should things worsen we can move to the ICU and start norepinephrine infusion.  Acute on chronic heart failure with preserved ejection fraction -Management per cardiology -Hopefully we can get his blood pressure improved and restart the Lasix infusion.  Atrial fibrillation with RVR -Amiodarone as above -Continue Eliquis - Keep K > 4 and Mag > 2  Acute  on chronic respiratory failure with hypoxia: Pulmonary edema. On 2L home O2 -Continue supplemental oxygen to achieve sat goal of greater than 90% -Advanced directive would allow for BiPAP which would be a reasonable choice should things worsen -Again hopefully with rate or rhythm control this will improve  Remainder per primary service   Best Practice (right click and "Reselect all SmartList Selections" daily)   Per primary  Labs   CBC: Recent Labs  Lab 08/12/2022 1314 08/23/22 0428 08/24/22 0228 08/25/22 0640 08/25/22 2358  WBC 7.7 7.8 11.3* 10.6* 11.7*  NEUTROABS 6.2  --   --   --   --   HGB 8.5* 8.6* 8.7* 8.8* 8.7*  HCT 27.5* 27.7* 28.0* 29.4* 29.1*  MCV 79.7* 81.0 81.4 81.2 80.6  PLT 295 332 335 329 327    Basic Metabolic Panel: Recent Labs  Lab 08/23/22 0813 08/23/22 0900 08/23/22 1614 08/23/22 2151 08/24/22 0228 08/24/22 1049 08/24/22 2215 08/25/22 0341 08/25/22 0640 08/25/22 1405 08/25/22 2358  NA  --    < > 130*   < > 135   < > 136 134* 134* 133* 133*  K  --    < > 3.2*   < > 3.0*   < > 3.9 3.8 3.5 3.6 3.2*  CL  --    < > 83*   < > 86*   < > 84* 85* 85* 84* 87*  CO2  --    < > 34*   < > 34*   < > 36* 34* 35*  33* 33*  GLUCOSE  --    < > 133*   < > 120*   < > 121* 122* 108* 144* 131*  BUN  --    < > 123*   < > 109*   < > 101* 103* 101* 104* 99*  CREATININE  --    < > 2.04*   < > 1.80*   < > 1.95* 1.89* 1.87* 1.90* 1.99*  CALCIUM  --    < > 6.5*  6.9   < > 7.0*   < > 7.2* 7.2* 7.2* 7.4* 7.8*  MG 1.6*  --   --   --  2.2  --   --   --  1.9  --   --   PHOS  --   --  4.5  --   --   --   --   --   --   --   --    < > = values in this interval not displayed.   GFR: Estimated Creatinine Clearance: 24.5 mL/min (A) (by C-G formula based on SCr of  1.99 mg/dL (H)). Recent Labs  Lab 08/23/22 0428 08/23/22 0813 08/24/22 0228 08/25/22 0640 08/25/22 2358  WBC 7.8  --  11.3* 10.6* 11.7*  LATICACIDVEN  --  0.7  --   --   --     Liver Function Tests: Recent Labs  Lab 08/23/22 0813 08/25/22 0640  AST 10* 12*  ALT 10 10  ALKPHOS 93 90  BILITOT 0.4 0.7  PROT 6.6 6.5  ALBUMIN 3.1* 2.8*   No results for input(s): "LIPASE", "AMYLASE" in the last 168 hours. No results for input(s): "AMMONIA" in the last 168 hours.  ABG    Component Value Date/Time   PHART 7.496 (H) 09/24/2018 1224   PCO2ART 45.4 09/24/2018 1224   PO2ART 74.2 (L) 09/24/2018 1224   HCO3 29.6 (H) 08/26/2022 1310   TCO2 31 08/19/2022 1310   O2SAT 94 08/06/2022 1310     Coagulation Profile: No results for input(s): "INR", "PROTIME" in the last 168 hours.  Cardiac Enzymes: No results for input(s): "CKTOTAL", "CKMB", "CKMBINDEX", "TROPONINI" in the last 168 hours.  HbA1C: Hgb A1c MFr Bld  Date/Time Value Ref Range Status  06/14/2022 03:09 PM 5.9 (H) 4.8 - 5.6 % Final    Comment:    (NOTE) Pre diabetes:          5.7%-6.4%  Diabetes:              >6.4%  Glycemic control for   <7.0% adults with diabetes   10/17/2020 06:17 PM 5.9 (H) 4.8 - 5.6 % Final    Comment:    (NOTE) Pre diabetes:          5.7%-6.4%  Diabetes:              >6.4%  Glycemic control for   <7.0% adults with diabetes     CBG: No results for  input(s): "GLUCAP" in the last 168 hours.  Review of Systems:   Bolds are positive  Constitutional: weight loss, gain, night sweats, Fevers, chills, fatigue .  HEENT: headaches, Sore throat, sneezing, nasal congestion, post nasal drip, Difficulty swallowing, Tooth/dental problems, visual complaints visual changes, ear ache CV:  chest pain, radiates:,Orthopnea, PND, swelling in lower extremities**, dizziness, palpitations, syncope.  GI  heartburn, indigestion, abdominal pain, nausea, vomiting, diarrhea, change in bowel habits, loss of appetite, bloody stools.  Resp: cough, productive: , hemoptysis, dyspnea, chest pain, pleuritic.  Skin: rash or itching or icterus GU: dysuria, change in color of urine, urgency or frequency. flank pain, hematuria  MS: joint pain or swelling. decreased range of motion  Psych: change in mood or affect. depression or anxiety.  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   Surgical History:   Past Surgical History:  Procedure Laterality Date   ARTHROSCOPIC REPAIR ACL     right   BONE MARROW ASPIRATION  02/25/11   Bone Marrow, Aspirate,  Clot, and Bilateral Bx,  Right PIC   CATARACT EXTRACTION, BILATERAL     ESOPHAGOGASTRODUODENOSCOPY (EGD) WITH PROPOFOL N/A 10/19/2020   Procedure: ESOPHAGOGASTRODUODENOSCOPY (EGD) WITH PROPOFOL;  Surgeon: Imogene Burn, MD;  Location: [facility] ENDOSCOPY;  Service: Gastroenterology;  Laterality: N/A;   EYE SURGERY     HEMOSTASIS CLIP PLACEMENT  10/19/2020   Procedure: HEMOSTASIS CLIP PLACEMENT;  Surgeon: Imogene Burn, MD;  Location: [facility] ENDOSCOPY;  Service: Gastroenterology;;   HERNIA REPAIR     LEFT INGUINAL    INSERTION OF MESH N/A 08/23/2012   Procedure: INSERTION OF MESH;  Surgeon: Lodema Pilot, DO;  Location: [facility] ORS;  Service: General;  Laterality: N/A;   LOOP RECORDER INSERTION N/A 04/26/2019   Procedure: LOOP RECORDER INSERTION;  Surgeon: Marinus Maw, MD;  Location: [facility] INVASIVE CV LAB;  Service: Cardiovascular;  Laterality: N/A;   LUMBAR LAMINECTOMY/DECOMPRESSION MICRODISCECTOMY Right 12/31/2013   Procedure: RIGHT L4-5 L5-S1 LAMINECTOMY;  Surgeon: Barnett Abu, MD;  Location: [facility] NEURO ORS;  Service: Neurosurgery;  Laterality: Right;  RIGHT L4-5 L5-S1 LAMINECTOMY   POLYPECTOMY  10/19/2020   Procedure: POLYPECTOMY;  Surgeon: Imogene Burn, MD;  Location: [facility] ENDOSCOPY;  Service: Gastroenterology;;   RIGHT HEART CATH N/A 07/25/2018   Procedure: RIGHT HEART CATH;  Surgeon: Laurey Morale, MD;  Location: [facility] INVASIVE CV LAB;  Service: Cardiovascular;  Laterality: N/A;   RIGHT HEART CATH N/A 02/05/2022   Procedure: RIGHT HEART CATH;  Surgeon: Laurey Morale, MD;  Location: [facility] INVASIVE CV LAB;  Service: Cardiovascular;  Laterality: N/A;   ROTATOR CUFF REPAIR     right   TONSILLECTOMY     TONSILLECTOMY     VENTRAL HERNIA REPAIR N/A 08/23/2012   Procedure: HERNIA REPAIR VENTRAL ADULT;  Surgeon: Lodema Pilot, DO;  Location: [facility] ORS;  Service: General;  Laterality: N/A;     Social History:   reports that he quit smoking about 30 years ago. His smoking use included pipe and cigarettes. He started  smoking about 65 years ago. He has a 35 pack-year smoking history. He has never used smokeless tobacco. He reports that he does not currently use alcohol. He reports that he does not use drugs.   Family History:  His family history includes ADD / ADHD in an other family member; ALS in his sister; Dementia in his mother and sister; Heart disease in his father; Hyperlipidemia in an other family member; Hypertension in an other family member; Parkinson's disease in his sister; Prostate cancer in his brother, paternal uncle, paternal uncle, and paternal uncle; Restless legs syndrome in his father; Stroke in an other family member; Urticaria in his father. There is no history of Colon cancer, Allergic rhinitis, Asthma, or Eczema.   Allergies Allergies  Allergen Reactions   Pollen Extract-Tree Extract Other (See Comments)    "HEADACHES, TIRED, DRAINAGE FROM SINUSES"   Feraheme [Ferumoxytol] Other (See Comments)    Chest pain, facial flushing, shortness of breath   Gluten Meal Diarrhea   Molds & Smuts Other (See Comments)    Also dust mites causes sinus infections, h/a etc.     Home Medications  Prior to Admission medications   Medication Sig Start Date End Date Taking? Authorizing Provider  acetaminophen (TYLENOL) 500 MG tablet Take 500 mg by mouth every 6 (six) hours as needed for mild pain or moderate pain.   Yes [provider]  atorvastatin (LIPITOR) 40 MG tablet Take 1 tablet (40 mg total) by mouth daily at 6 PM. 04/27/19  Yes Kathlen Mody, MD  cyanocobalamin (,VITAMIN B-12,) 1000 MCG/ML injection Inject 1,000 mcg into the muscle every 30 (thirty) days.   Yes [provider]  ELIQUIS 2.5 MG TABS tablet TAKE ONE TABLET TWICE DAILY Patient taking differently: Take 2.5 mg by mouth 2 (two) times daily. 07/29/22  Yes Laurey Morale, MD  escitalopram (LEXAPRO) 10 MG tablet Take 10 mg by mouth in the morning.   Yes [provider]  finasteride (PROSCAR) 5 MG tablet Take  5 mg by mouth every evening.   Yes [provider]  FLONASE SENSIMIST 27.5 MCG/SPRAY nasal spray Place 1 spray into the nose daily as needed for rhinitis or allergies.  12/11/18  Yes [provider]  gabapentin (NEURONTIN) 100 MG capsule Take 1 capsule (100 mg total) by mouth 2 (two) times daily. Patient taking differently: Take 100 mg by mouth at bedtime. 06/03/22  Yes Butch Penny, NP  guaiFENesin (MUCINEX) 600 MG 12 hr tablet Take 600 mg by mouth in the morning.   Yes [provider]  Iron, Ferrous Sulfate, 325 (65 Fe) MG TABS Take 325 mg by mouth daily. 12/31/20  Yes Milford, Anderson Malta, FNP  metolazone (ZAROXOLYN) 2.5 MG tablet Take 1 tablet (2.5 mg total) by mouth 2 (two) times a week. Patient taking differently: Take 2.5 mg by mouth See admin instructions. Take one tablet by mouth on Tuesdays and Fridays 08/02/22  Yes Alen Bleacher, NP  metoprolol succinate (TOPROL-XL) 25 MG 24 hr tablet TAKE 1/2 TABLET AT BEDTIME 05/19/22  Yes Laurey Morale, MD  midodrine (PROAMATINE) 5 MG tablet Take 2 tablets (10 mg total) by mouth 3 (three) times daily with meals. Patient taking differently: Take 10 mg by mouth 2 (two) times daily with a meal. 08/02/22  Yes Alen Bleacher, NP  Multiple Vitamins-Minerals (PRESERVISION AREDS 2) CAPS Take 1 capsule by mouth 2 (two) times daily.   Yes [provider]  omeprazole (PRILOSEC) 20 MG capsule Take 1 capsule (20 mg total) by mouth 2 (two) times daily before a meal. 09/16/21  Yes Imogene Burn, MD  ondansetron (ZOFRAN-ODT) 4 MG disintegrating tablet Take 1 tablet (4 mg total) by mouth every 8 (eight) hours as needed for nausea or vomiting. 02/08/22  Yes Ghimire, Werner Lean, MD  OXYGEN Inhale 2 L/min into the lungs at bedtime as needed (shortness of breath).   Yes [provider]  potassium chloride SA (KLOR-CON M) 20 MEQ tablet Take 4 tablets (80 mEq total) by mouth in the morning AND 2 tablets (40 mEq total) every evening. Patient  taking differently: Take 4 tablets (80 mEq total) by mouth in the morning AND 2 tablets by mouth(40 mEq total) every evening. Take a extra 2 tablets  on Tuesday and Friday only  03/04/22  Yes Laurey Morale, MD  Riociguat (ADEMPAS) 2.5 MG TABS Take 2.5 mg by mouth 3 (three) times daily. 07/22/22  Yes Laurey Morale, MD  rOPINIRole (REQUIP) 2 MG tablet TAKE ONE TABLET THREE TIMES DAILY 05/10/22  Yes Butch Penny, NP  Selexipag (UPTRAVI) 1600 MCG TABS Take 1,600 mcg by mouth 2 (two) times daily. 0800 and 1900   Yes [provider]  Selexipag 400 MCG TABS Take 400 mcg by mouth 2 (two) times daily. 0800 and 1900   Yes [provider]  torsemide (DEMADEX) 100 MG tablet TAKE ONE TABLET TWICE DAILY 09/24/21  Yes Milford, Anderson Malta, FNP  TRAZODONE HCL ER PO Take 1 tablet by mouth at bedtime.   Yes  [provider]  oxyCODONE (OXY IR/ROXICODONE) 5 MG immediate release tablet Take 5 mg by mouth 2 (two) times daily as needed. 06/14/22   [provider]     Critical care time:       Joneen Roach, AGACNP-BC [facility]  See Amion for personal pager PCCM on call pager [phone] until 7pm. Please call Elink 7p-7a. [phone]  08/26/2022 3:02 AM

## 2022-08-26 NOTE — Progress Notes (Signed)
Got message from RN stating that Ryan [name] had removed all his lines and was refusing all his meds. On assessment, the patient was AA0x3. He knew what brought him to the hospital but did not fully understand why he was in the hospital. The patient was explained the reasons behind his hospitalization and what treatments he was getting today in depth. He was told about his afib (which he was aware of) and was told about his fluid overload, his low BP, and tachycardia. I explained the different medications that we were giving him to treat him and also the risks of not receiving them. Earlier, while assessing the patient, he was having visual hallucinations (which RN confirmed he was having them previously during the course of his hospitalization). He admitted to having some very vivid dreams about the army and him being in a secret place. He did say that he now realized that those were not reality but it was very hard to distinguish reality at that moment. Of note, tonight has been a rough night for him with multiple interruptions on his sleep due to his instability. He relayed understanding and asked to be put back on his medications and get the ivs placed.

## 2022-08-26 NOTE — Plan of Care (Signed)
Patient and family education regarding plan of care at bedside today.  Patient was unable to recall instructions due to cognitive decline.  Palliative care team and MD rounded on patient and had GOC discussion with family; patient to be comfort care and planning discharge to [facility].  Progression is adequate for discharge to skilled nursing facility at this time.

## 2022-08-26 NOTE — Plan of Care (Signed)
  Problem: Education: Goal: Knowledge of General Education information will improve Description: Including pain rating scale, medication(s)/side effects and non-pharmacologic comfort measures Outcome: Not Progressing   Problem: Clinical Measurements: Goal: Will remain free from infection Outcome: Progressing

## 2022-08-26 NOTE — Consult Note (Signed)
Consultation Note Date: 08/26/2022   Patient Name: Ryan [name]  DOB: 17-Feb-1931  MRN: [id number]  Age / Sex: 89 y.o., male  PCP: Ashley Royalty, NP Referring Physician: Dickie La, MD  Reason for Consultation:  end stage pulmonary hypertension and decompensated CHF with no further medications from cardiology  HPI/Patient Profile: 89 y.o. male  with past medical history of pulmonary hypertension, DM2, HLD, HTN, asthma, CKD IV, macular degeneration, chronic pain, CHF, chronic hypotension admitted on 08/11/2022 with hypoxia and lower extremity edema. Found to be in volume overload.  Admitted and started on IV lasix. He has diuresed a significant amount with improvement in his lower extremity edema and shortness of breath. Admission complicated by delirium, acute on chronic kidney injury and pulmonary edema. He had an episode of possible aspiration with oxygen desaturation yesterday evening and repeat chest xray indicated worsening pulmonary edema. Cardiology recommending comfort focused care. Palliative medicine consulted for GOC.   Primary Decision Maker NEXT OF KIN - spouse Clare Casto  Discussion: Chart reviewed including labs, progress notes, imaging from this and previous encounters.  Met at bedside with patient, spouse, and attending Drs. Chilton Greathouse. Patient participated in discussion intermittently, spouse was primary source of history.  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  Prior to admission he lived at [facility] with care assistance. He is married and has children.  We reviewed his chronic illnesses, comorbidities and likely trajectory.  Discussed unfortunately current interventions are providing temporary improvement, but he is  likely to decline again quickly.  Options for continued aggressive medical care, rehab vs more comfort focused care and hospice presented and considered.  Patient able to verbalize that if he is not going to be on this earth for very long he would prefer to be at home.  Hospice philosophy and services discussed.  Patient and spouse expressed desire for comfort focused care and to return to [facility] with hospice noting that one of the main reasons they chose [facility] is because they were told patient could remain in his apartment until end of life and have hospice support there.      SUMMARY OF RECOMMENDATIONS -Recommend maximizing diuresis and transition to po diuretics and amiodorone prior to discharge- recommend continuing these until he is no longer able to take pills -Continue buprenorphine patch- change oxycodone 5mg  po pill to oxycodone 5mg  liquid -D/C lipitor and other medications not adding to comfort or quality -Other comfort medications as ordered -TOC order for hospice referral     Code Status/Advance Care Planning: DNR   Prognosis:   < 4 weeks  Discharge Planning: Home with Hospice  Primary Diagnoses: Present on Admission:  Acute on chronic hypoxic respiratory failure (HCC)   Review of Systems  Physical Exam  Vital Signs: BP 111/66   Pulse 85   Temp 97.8 F (36.6 C) (Oral)   Resp 20  Ht 5\' 3"  (1.6 m)   Wt 83.7 kg   SpO2 97%   BMI 32.69 kg/m  Pain Scale: 0-10   Pain Score: 0-No pain   SpO2: SpO2: 97 % O2 Device:SpO2: 97 % O2 Flow Rate: .O2 Flow Rate (L/min): 10 L/min  IO: Intake/output summary:  Intake/Output Summary (Last 24 hours) at 08/26/2022 1306 Last data filed at 08/26/2022 0740 Gross per 24 hour  Intake 486.25 ml  Output 1850 ml  Net -1363.75 ml    LBM: Last BM Date : 08/25/22 Baseline Weight: Weight: 85.3 kg Most recent weight: Weight: 83.7 kg       Thank you for this consult. Palliative medicine will continue to follow and  assist as needed.  Time Total: 90 minutes Greater than 50%  of this time was spent counseling and coordinating care related to the above assessment and plan.  Signed by: Ocie Bob, AGNP-C Palliative Medicine    Please contact Palliative Medicine Team phone at [phone] for questions and concerns.  For individual provider: See Loretha Stapler

## 2022-08-26 NOTE — Progress Notes (Signed)
[facility] Liaison  Referral received for patient/family interest in hospice at LTC. Plan is for patient to return to [facility].   No DME needed.   Please send comfort medications/prescriptions with patient at discharge.   Please call with any questions or concerns. Thank you  Dionicio Stall, [facility] Liaison  [phone]

## 2022-08-26 NOTE — Care Management Important Message (Signed)
Important Message  Patient Details  Name: Ryan [name] MRN: [id number] Date of Birth: September 03, 1931   Medicare Important Message Given:  Yes     Renie Ora 08/26/2022, 8:40 AM

## 2022-08-26 NOTE — TOC Progression Note (Signed)
Transition of Care [facility]) - Progression Note    Patient Details  Name: Ryan [name] MRN: [id number] Date of Birth: 02-06-1931  Transition of Care [facility]) CM/SW Contact  Graves-Bigelow, Lamar Laundry, RN Phone Number: 08/26/2022, 4:17 PM  Clinical Narrative: Case Manager received notification from Palliative NP that the patient will need Hospice at [facility]. Palliative NP states patients spouse asked for [facility]. Case Manager attempted several times to call the spouse. Spouse was not at the bedside during the initial visit. Case Manager did call Rene Kocher the liaison for [place] to make her aware of hospice services. Unsure of DME that is needed at this time. Referral submitted to [facility]. Case Manager will continue to follow for transition of care needs.      Expected Discharge Plan: Home w Hospice Care Barriers to Discharge: No Barriers Identified  Expected Discharge Plan and Services In-house Referral: Clinical Social Work Discharge Planning Services: CM Consult   Living arrangements for the past 2 months: Independent Living Facility    HH Arranged: RN [facility] Agency: Hospice and Palliative [facility] of [facility] ([facility]) Date HH Agency Contacted: 08/26/22 Time HH Agency Contacted: 1616 Representative spoke with at [facility] Agency: Danford Bad  Social Determinants of Health (SDOH) Interventions SDOH Screenings   Food Insecurity: No Food Insecurity (08/23/2022)  Housing: Patient Declined (08/23/2022)  Transportation Needs: No Transportation Needs (08/23/2022)  Utilities: Not At Risk (08/23/2022)  Financial Resource Strain: Low Risk  (02/08/2022)  Social Connections: Unknown (06/09/2021)   Received from [facility]  Tobacco Use: Medium Risk (08/25/2022)   Readmission Risk Interventions     No data to display

## 2022-08-26 NOTE — Progress Notes (Signed)
HD#3 SUBJECTIVE:  Patient Summary: Ryan [name] is a 89 y.o. with a pertinent PMH of pulmonary hypertension, decompensated congestive heart failure, atrial fibrillation, and hypertension who presented with worsening hypoxia and admitted for acute on chronic respiratory failure.   Overnight Events: Yesterday evening, oxygen requirement up to 14 L high flow nasal cannula and heart rate up to 120.  This morning around 2:45 AM, patient developed new onset RVR with sustained increased O2 requirement with high flow nasal cannula and gradual decrease of blood pressure.  Lasix drip held as MAP under 65.  PCCM consulted with concern for cardiogenic shock and decompensation.  Patient was started on amiodarone for rate control, he was given one-time dose of midodrine 10 mg for blood pressure support, and is daytime midodrine was increased to 20 mg 3 times daily.  This morning at 4 AM, patient removed all of his lines and refused all of his medications.  After discussing treatment plan for patient, he agreed to resume his treatment.  Interim History: Patient was evaluated at bedside. Continues to require 10 L of oxygen. Reports intolerance of mittens so these were removed. No changes in shortness of breath or troubles bleeding since last evaluated but continues to breath with pursed lips.   OBJECTIVE:  Vital Signs: Vitals:   08/26/22 0427 08/26/22 0613 08/26/22 0651 08/26/22 0735  BP: (!) 98/50 (!) 98/52 99/64   Pulse: 99 92 (!) 107   Resp: (!) 28 (!) 30 19 20   Temp:  98 F (36.7 C) 98.1 F (36.7 C) 98 F (36.7 C)  TempSrc:  Axillary Oral Oral  SpO2: 94% 94%    Weight:      Height:       Supplemental O2: Nasal Cannula SpO2: 94 % O2 Flow Rate (L/min): 10 L/min  Filed Weights   08/24/22 0344 08/25/22 0356 08/26/22 0421  Weight: 84.2 kg 83.1 kg 83.7 kg    Intake/Output Summary (Last 24 hours) at 08/26/2022 0909 Last data filed at 08/26/2022 0740 Gross per 24 hour  Intake 486.25 ml  Output  1850 ml  Net -1363.75 ml   Net IO Since Admission: -18,085.01 mL [08/26/22 0909]  CBC    Component Value Date/Time   WBC 11.7 (H) 08/25/2022 2358   RBC 3.61 (L) 08/25/2022 2358   HGB 8.7 (L) 08/25/2022 2358   HGB 9.7 (L) 12/10/2019 1007   HGB 14.2 06/26/2015 0916   HCT 29.1 (L) 08/25/2022 2358   HCT 33.2 (L) 12/10/2019 1007   HCT 42.9 06/26/2015 0916   PLT 327 08/25/2022 2358   PLT 328 12/10/2019 1007   MCV 80.6 08/25/2022 2358   MCV 85 12/10/2019 1007   MCV 91.8 06/26/2015 0916   MCH 24.1 (L) 08/25/2022 2358   MCHC 29.9 (L) 08/25/2022 2358   RDW 16.4 (H) 08/25/2022 2358   RDW 15.8 (H) 12/10/2019 1007   RDW 15.0 (H) 06/26/2015 0916   LYMPHSABS 0.6 (L) 08/23/2022 1314   LYMPHSABS 1.5 12/10/2019 1007   LYMPHSABS 1.4 06/26/2015 0916   MONOABS 0.7 08/23/2022 1314   MONOABS 0.6 06/26/2015 0916   EOSABS 0.2 08/23/2022 1314   EOSABS 0.3 12/10/2019 1007   BASOSABS 0.0 08/06/2022 1314   BASOSABS 0.0 12/10/2019 1007   BASOSABS 0.0 06/26/2015 0916   CMP    Latest Ref Rng & Units 08/25/2022   11:58 PM 08/25/2022    2:05 PM 08/25/2022    6:40 AM  CMP  Glucose 70 - 99 mg/dL 332  951  108   BUN 8 - 23 mg/dL 99  875  643   Creatinine 0.61 - 1.24 mg/dL 3.29  5.18  8.41   Sodium 135 - 145 mmol/L 133  133  134   Potassium 3.5 - 5.1 mmol/L 3.2  3.6  3.5   Chloride 98 - 111 mmol/L 87  84  85   CO2 22 - 32 mmol/L 33  33  35   Calcium 8.9 - 10.3 mg/dL 7.8  7.4  7.2   Total Protein 6.5 - 8.1 g/dL   6.5   Total Bilirubin 0.3 - 1.2 mg/dL   0.7   Alkaline Phos 38 - 126 U/L   90   AST 15 - 41 U/L   12   ALT 0 - 44 U/L   10     Physical Exam Cardiovascular:     Rate and Rhythm: Regular rhythm. Tachycardia present.  Pulmonary:     Comments: Patient breathing with pursed lips Musculoskeletal:     Right lower leg: Edema present.     Left lower leg: Edema present.  Skin:    General: Skin is warm.     Findings: Erythema present.     Comments: Erythema of bilateral lower extremities    Neurological:     General: No focal deficit present.     Mental Status: He is oriented to person, place, and time.  Psychiatric:        Mood and Affect: Mood and affect normal.    ASSESSMENT/PLAN:  Assessment: Principal Problem:   Acute on chronic hypoxic respiratory failure (HCC) Active Problems:   Hypocalcemia   Hypotension   Pressure injury of skin  Plan: # Acute on chronic diastolic congestive heart failure #Shortness of breath Over the last 24 hours, net output is -1.6 L.  Since admission, his output is -18.3 L.  His oxygen requirement remains at 10 L, which is significantly higher than his home amount of 2 L and yesterday's requirement of 3 L.  Chest x-ray yesterday evening showed interstitial pulmonary edema with interval worsening and patchy infiltrates in the left lower lung field suggestive of atelectasis or pneumonia.  Patient remains afebrile with his most recent white blood cell count of 11.7. Lasix drip continues to be held due to concern for hypotension and low MAP.  This will continue to be held for a MAP less than 65.  Palliative to meet with patient and wife today to discuss goals of care. - Hold Lasix gtt  - Attempt to decrease oxygen requirements - Palliative to meet with patient today - Midodrine increased to 20 mg 3 times daily - Continue PT/OT  - F/U pulm crit recs  - F/U cards recs  #Atrial fibrillation with RVR This morning, patient developed RVR with some tachycardia: Elevated today to 110s-140s.  Much better up PCCM started on amiodarone with resultant heart rate in 90s. -Amiodarone 60 mg/h for 6 hours and then 30 mg/h.  #Hypokalemia #Hypomagnesia, resolved Admitted with a potassium level of 3.4.  This morning, his potassium was 3.2 and continues to remain stable.  If Lasix gtt. resumed, repeat BMP every 8 hours. - Replenish as needed - Repeat BMP every 8 hours if lasix gtt resumed  #Hypocalcemia, resolved Calcium on admission was 6.2 that is likely  secondary to CKD.  Continuing to receive oral calcium 3 times daily.  Corrected calcium this AM 8.8.  - Continue PO calcium TID - Trend BMP and replenish as needed  #Hyponatremia, resolved Admitted  with a sodium of 126. This morning, sodium stable at 133. - Continue to closely monitor BMP  #Acute on chronic kidney disease Currently in CKD stage IV. His baseline creatinine is 2.28.  This morning, his creatinine is stable at 1.99. - Monitor function and continue to trend labs  #Anemia of chronic disease CBC from yesterday showed stable Hgb of 8.7 - Daily CBC  #Pulmonary hypertension #Interstitial lung disease He follows with Dr. Shirlee Latch. He is not reporting worsening cough, fever. He had chest x-ray, concerning for worsening interstitial edema. - Continue Riociguat 2.5 mg TID - Continue Selexipag - Continue UPTRAVI  #History of hyperlipidemia -Lipitor 40 mg . #History of depression -Lexapro 10 mg   #History of restless syndrome -Ropinorole to 2 mg TID   #History of chronic hypotension -Continue midodrine 10 mg TID   #History of BPH -Finasteride 5 mg    #History of GERD -Omeprazole 20 mg twice daily -Zofran   #History of insomnia -Trazodone at bedtime.    #History of low back pain Follows with pain med specialist.  -Buprenorphine 5 mcg/hr weekly patch -Gabapentin 100 mg 2 times daily.   #History of constipation -Continue senna-docusate as needed  Best Practice: Diet: Cardiac diet VTE: apixaban (ELIQUIS) tablet 2.5 mg Start: 08/04/2022 2200 Code: DNR DISPO: Anticipated discharge to  SNF  pending  resolution of symptoms .  Signature: Morrie Sheldon, MD Internal Medicine Resident, PGY-1 [facility]  Pager: [phone]  Please contact the on call pager after 5 pm and on weekends at [phone].

## 2022-08-26 NOTE — Progress Notes (Signed)
Patient has had new-onset RVR tonight (previously rate controlled) with sustained increased O2 requirement with HFNC and gradual decrease in BP. Lasix drip was reordered with parameter to stop if MAP <65; this was not able to be started given further decline in BP. MAP at its lowest was 54, confirmed by manual check. PCCM was consulted due to concern that he could be in early cardiogenic shock and was continuing to show signs of decompensation. Given severe hypotension I elected to not aggressively treat HR as I was concerned about perfusion, pending further recommendations from PCCM.  At bedside with Dr. Isaiah Serge and Joneen Roach, NP, patient's SBP was improved to 110 and MAP had improved to 64. He remained tachycardic and he was diaphoretic but asking to be left alone to get some sleep. At this time, he has been started on amiodarone by PCCM for rate control and given a one time dose of midodrine 10 mg for BP support, and his daytime midodrine was increased to 20 mg TID. Based on chart review it seems that his wife will be back in town tomorrow for ongoing palliative care discussions which I feel is very appropriate. If he has additional acute decline I will reach back out to PCCM. I am concerned that his body will not be able to compensate for the severity of his disease for much longer.  Overnight cardiology fellow has been given an update and rounding team will see him tomorrow.  Champ Mungo, DO Internal Medicine PGY-3

## 2022-08-26 NOTE — Plan of Care (Signed)
  Problem: Education: Goal: Knowledge of General Education information will improve Description: Including pain rating scale, medication(s)/side effects and non-pharmacologic comfort measures Outcome: Progressing   Problem: Health Behavior/Discharge Planning: Goal: Ability to manage health-related needs will improve Outcome: Progressing   Problem: Clinical Measurements: Goal: Ability to maintain clinical measurements within normal limits will improve Outcome: Progressing Goal: Will remain free from infection Outcome: Progressing   Problem: Activity: Goal: Risk for activity intolerance will decrease Outcome: Progressing   Problem: Nutrition: Goal: Adequate nutrition will be maintained Outcome: Progressing   Problem: Coping: Goal: Level of anxiety will decrease Outcome: Progressing   Problem: Elimination: Goal: Will not experience complications related to bowel motility Outcome: Progressing Goal: Will not experience complications related to urinary retention Outcome: Progressing   Problem: Pain Managment: Goal: General experience of comfort will improve Outcome: Progressing   Problem: Safety: Goal: Ability to remain free from injury will improve Outcome: Progressing   Problem: Skin Integrity: Goal: Risk for impaired skin integrity will decrease Outcome: Progressing   Problem: Education: Goal: Knowledge of the prescribed therapeutic regimen will improve Outcome: Progressing   Problem: Coping: Goal: Ability to identify and develop effective coping behavior will improve Outcome: Progressing   Problem: Clinical Measurements: Goal: Quality of life will improve Outcome: Progressing   Problem: Respiratory: Goal: Verbalizations of increased ease of respirations will increase Outcome: Progressing   Problem: Role Relationship: Goal: Family's ability to cope with current situation will improve Outcome: Progressing Goal: Ability to verbalize concerns, feelings, and  thoughts to partner or family member will improve Outcome: Progressing   Problem: Pain Management: Goal: Satisfaction with pain management regimen will improve Outcome: Progressing

## 2022-08-26 NOTE — Progress Notes (Signed)
I/O catheterization complete using aseptic technique; assisted per Toula Moos, NT. Patient had evidence of urethral trauma prior and was difficult to catheterize requiring several moments for prostatic relaxation. Hematuria with clots was present; only able to get about out after scanning . Sample retained should MD want to check urinalysis or culture for any reason.  Secure text message sent to Dr. Sol Blazing and team.  RN would recommend urology consultation since patient has continued to have difficulty voiding on his own and now clearly has trauma in addition.  -- Caleen Essex, RN, BSN

## 2022-08-26 NOTE — Progress Notes (Signed)
Patient in A fib RVR after he accidentally took off his O2. Heart rate sustained 140's . MD paged for further assessment.

## 2022-08-26 NOTE — Progress Notes (Signed)
   Patient Name: NAITHAN DELAGE Date of Encounter: 08/26/2022 [facility] Cardiologist: Nanetta Batty, MD, Dr. Marca Ancona  Interval Summary  .    Overnight delirium, pulled all IVs.  Notes reviewed. Critical care consultation.  Hypotension with A-fib RVR MOST form reviewed  Vital Signs .    Vitals:   08/26/22 0427 08/26/22 0613 08/26/22 0651 08/26/22 0735  BP: (!) 98/50 (!) 98/52 99/64   Pulse: 99 92 (!) 107   Resp: (!) 28 (!) 30 19 20   Temp:  98 F (36.7 C) 98.1 F (36.7 C) 98 F (36.7 C)  TempSrc:  Axillary Oral Oral  SpO2: 94% 94%    Weight:      Height:        Intake/Output Summary (Last 24 hours) at 08/26/2022 0814 Last data filed at 08/26/2022 0740 Gross per 24 hour  Intake 486.25 ml  Output 1850 ml  Net -1363.75 ml      08/26/2022    4:21 AM 08/25/2022    3:56 AM 08/24/2022    3:44 AM  Last 3 Weights  Weight (lbs) 184 lb 8.4 oz 183 lb 3.2 oz 185 lb 10 oz  Weight (kg) 83.7 kg 83.1 kg 84.2 kg      Telemetry/ECG    Currently sinus rhythm with occasional PACs- Personally Reviewed  Potassium 3.2, creatinine 1.99 stable, hemoglobin 8.7 stable  Physical Exam .   GEN: Elderly, ill-appearing, confused Neck: No JVD Cardiac: RRR, no murmurs, rubs, or gallops.  Ectopy noted Respiratory: Scattered rhonchi bilaterally. GI: Soft, nontender, non-distended  MS: Chronic edema  Assessment & Plan .     89 year old with end-stage diastolic heart failure with multiple readmissions for anasarca, lymphedema, pulmonary hypertension, stage IV chronic kidney disease, blindness, chronic hypotension, paroxysmal atrial fibrillation  Diastolic heart failure - Came into the hospital grossly fluid overloaded.  This is a repeat issue which is in part attributing to dietary indiscretion at [facility], mostly described as a lack of ability to get food with low sodium by his wife. -IV Lasix was utilized with very aggressive diuresis.  Hypotension ensued and IV Lasix  was held transiently  Hypokalemia - Repleted with supplementation.  Last value 3.2.  IV Lasix was held yesterday because of hypotension.  This has been resumed at 2 mg/h this morning.    Hypotension - Has been a relatively chronic issue.  On midodrine. - Appreciate critical care's assistance and increasing midodrine to 20 mg. -Would try to avoid further aggressive measures such as norepinephrine or vasopressin.  Paroxysmal atrial fibrillation - IV amiodarone started secondary to RVR episode last night.  Chronic anticoagulation - On low-dose Eliquis  Pulmonary hypertension - He receives special shipments of medication which his wife brought to the hospital for pulmonary hypertension.  Goals of care - Further family discussion today.  Goal should be comfort driven.  Currently DNR.  For questions or updates, please contact [facility] Please consult [email or web address] for contact info under        Signed, Donato Schultz, MD

## 2022-08-27 DIAGNOSIS — Z515 Encounter for palliative care: Secondary | ICD-10-CM

## 2022-08-27 DIAGNOSIS — J9621 Acute and chronic respiratory failure with hypoxia: Secondary | ICD-10-CM | POA: Diagnosis not present

## 2022-08-27 DIAGNOSIS — N184 Chronic kidney disease, stage 4 (severe): Secondary | ICD-10-CM | POA: Diagnosis not present

## 2022-08-27 DIAGNOSIS — I130 Hypertensive heart and chronic kidney disease with heart failure and stage 1 through stage 4 chronic kidney disease, or unspecified chronic kidney disease: Secondary | ICD-10-CM | POA: Diagnosis not present

## 2022-08-27 DIAGNOSIS — Z7189 Other specified counseling: Secondary | ICD-10-CM | POA: Diagnosis not present

## 2022-08-27 MED ORDER — AMIODARONE HCL 200 MG PO TABS: 200.0000 mg | ORAL_TABLET | Freq: Two times a day (BID) | ORAL | Status: DC

## 2022-08-27 MED ORDER — HYDROMORPHONE BOLUS VIA INFUSION: 0.5000 mg | INTRAVENOUS | Status: DC | PRN

## 2022-08-27 MED ORDER — POTASSIUM CHLORIDE CRYS ER 20 MEQ PO TBCR
40.0000 meq | EXTENDED_RELEASE_TABLET | Freq: Once | ORAL | Status: AC
  Administered 2022-08-27: 40 meq via ORAL
  Filled 2022-08-27: qty 2

## 2022-08-27 MED ORDER — CHLORHEXIDINE GLUCONATE CLOTH 2 % EX PADS
6.0000 | MEDICATED_PAD | Freq: Every day | CUTANEOUS | Status: DC
  Administered 2022-08-26: 6 via TOPICAL

## 2022-08-27 MED ORDER — TORSEMIDE 20 MG PO TABS: 100.0000 mg | ORAL_TABLET | Freq: Two times a day (BID) | ORAL | Status: DC

## 2022-08-27 MED ORDER — LORAZEPAM 2 MG/ML IJ SOLN
0.5000 mg | INTRAMUSCULAR | Status: DC | PRN
  Administered 2022-08-27 (×2): 1 mg via INTRAVENOUS
  Filled 2022-08-27 (×2): qty 1

## 2022-08-27 MED ORDER — SODIUM CHLORIDE 0.9 % IV SOLN
1.0000 mg/h | INTRAVENOUS | Status: DC
  Administered 2022-08-27: 1 mg/h via INTRAVENOUS
  Filled 2022-08-27: qty 5

## 2022-08-27 MED ORDER — AMIODARONE HCL 200 MG PO TABS: 400.0000 mg | ORAL_TABLET | Freq: Two times a day (BID) | ORAL | Status: DC

## 2022-08-27 MED ORDER — HYDROMORPHONE HCL-NACL 50-0.9 MG/50ML-% IV SOLN: 1.0000 mg/h | INTRAVENOUS | Status: DC

## 2022-08-27 MED ORDER — AMIODARONE HCL 200 MG PO TABS: 200.0000 mg | ORAL_TABLET | Freq: Every day | ORAL | Status: DC

## 2022-09-02 NOTE — Death Summary Note (Cosign Needed)
  Name: [name] MRN: [id number] DOB: 1931-07-27 89 y.o.  Date of Admission: 08/17/2022 10:40 AM Date of Discharge: 08/21/2022 Attending Physician: Dickie La, MD  Discharge Diagnosis: Principal Problem:   Acute on chronic hypoxic respiratory failure (HCC) Active Problems:   Hypocalcemia   Hypotension   Pressure injury of skin   Pulmonary hypertension, primary (HCC)   Advanced care planning/counseling discussion   Acute renal failure superimposed on stage 4 chronic kidney disease (HCC)   Cause of death: respiratory failure secondary to end stage congestive heart failure and pulmonary hypertension Time of death: 1730  Disposition and follow-up:   Ryan [name] was discharged from [facility] in expired condition.    Hospital Course:  End-stage CHF, pulmonary HTN, A-fib, and CKD Ryan [name] is a 89 yo male with end-stage pulmonary hypertension, CHF, CKD IV, and afib who presented to the ED initially with acute on chronic hypoxic respiratory failure and significant lower extremity edema, admitting for multisystem organ failure (CKD and CHF). In the weeks prior to admission, he had increasing lower extremity edema and fatigue. He was started on Lasix drip for his profound volume overload and developed a critically low potassium, which was treated with supplemental K. He remained volume overloaded despite these interventions and continued to develop increasing oxygen requirements.  He soon developed atrial fibrillation with RVR and persistent hypotension with low MAPs.  Due to the patient's worsening clinical status and multiple, severe chronic conditions, the decision was made to focus on comfort care on 7/25. The patient had a significant decline over a 24 hour period and the morning of 7/26, he was disoriented and less responsive. Rather than transitioning to hospice at [facility], we had a shared decision making conversation with his wife about keeping him in  the hospital, for an in-hospital death. The patient was started on a dilaudid drip for pain/dyspnea and was kept comfortable. He died, with his wife next to him at the bedside, at 53.    SignedChauncey Mann, DO 08/31/2022, 5:57 PM

## 2022-09-02 NOTE — Plan of Care (Signed)
Patient transitioned to comfort care.    Problem: Education: Goal: Knowledge of General Education information will improve Description: Including pain rating scale, medication(s)/side effects and non-pharmacologic comfort measures 08/12/2022 1544 by Cathren Laine, RN Outcome: Adequate for Discharge  Problem: Health Behavior/Discharge Planning: Goal: Ability to manage health-related needs will improve 08/04/2022 1544 by Cathren Laine, RN Outcome: Adequate for Discharge  Problem: Clinical Measurements: Goal: Ability to maintain clinical measurements within normal limits will improve 08/05/2022 1544 by Cathren Laine, RN Outcome: Adequate for Discharge  Goal: Will remain free from infection 08/22/2022 1544 by Cathren Laine, RN Outcome: Adequate for Discharge   Problem: Activity: Goal: Risk for activity intolerance will decrease 09/01/2022 1544 by Cathren Laine, RN Outcome: Adequate for Discharge   Problem: Nutrition: Goal: Adequate nutrition will be maintained 08/15/2022 1544 by Cathren Laine, RN Outcome: Adequate for Discharge  Problem: Coping: Goal: Level of anxiety will decrease 08/12/2022 1544 by Cathren Laine, RN Outcome: Adequate for Discharge   Problem: Elimination: Goal: Will not experience complications related to bowel motility 08/14/2022 1544 by Cathren Laine, RN Outcome: Adequate for Discharge Goal: Will not experience complications related to urinary retention 08/07/2022 1544 by Cathren Laine, RN Outcome: Adequate for Discharge   Problem: Pain Managment: Goal: General experience of comfort will improve 08/19/2022 1544 by Cathren Laine, RN Outcome: Adequate for Discharge   Problem: Safety: Goal: Ability to remain free from injury will improve 08/07/2022 1544 by Cathren Laine, RN Outcome: Adequate for Discharge   Problem: Skin Integrity: Goal: Risk for impaired skin integrity will decrease 08/04/2022 1544 by Cathren Laine,  RN Outcome: Adequate for Discharge 08/14/2022 1544 by Cathren Laine, RN Outcome: Progressing   Problem: Education: Goal: Knowledge of the prescribed therapeutic regimen will improve 08/26/2022 1544 by Cathren Laine, RN Outcome: Adequate for Discharge 09/01/2022 1544 by Cathren Laine, RN Outcome: Progressing   Problem: Coping: Goal: Ability to identify and develop effective coping behavior will improve 08/04/2022 1544 by Cathren Laine, RN Outcome: Adequate for Discharge  Problem: Clinical Measurements: Goal: Quality of life will improve 08/26/2022 1544 by Cathren Laine, RN Outcome: Adequate for Discharge   Problem: Respiratory: Goal: Verbalizations of increased ease of respirations will increase 08/28/2022 1544 by Cathren Laine, RN Outcome: Adequate for Discharge 08/08/2022 1544 by Cathren Laine, RN Outcome: Progressing   Problem: Role Relationship: Goal: Family's ability to cope with current situation will improve 08/26/2022 1544 by Cathren Laine, RN Outcome: Adequate for Discharge  Goal: Ability to verbalize concerns, feelings, and thoughts to partner or family member will improve 08/05/2022 1544 by Cathren Laine, RN Outcome: Adequate for Discharge   Problem: Pain Management: Goal: Satisfaction with pain management regimen will improve 08/11/2022 1544 by Cathren Laine, RN Outcome: Adequate for Discharge

## 2022-09-02 NOTE — Progress Notes (Signed)
   HD#4 SUBJECTIVE:   Interim History: Ryan [name] was seen at the bedside. He is less responsive than yesterday and is not arousable to voice. He appears uncomfortable, but is not in distress.   OBJECTIVE:  Vital Signs: Vitals:   08/20/2022 0513 08/05/2022 0600 08/13/2022 0700 08/20/2022 0710  BP: (!) 107/59   (!) 88/55  Pulse: (!) 135 (!) 120 93 (!) 108  Resp: 14 15 13 15   Temp: 97.9 F (36.6 C)     TempSrc: Oral     SpO2: 94% 93% 98% 100%  Weight:      Height:       Supplemental O2: HFNC SpO2: 100 % O2 Flow Rate (L/min): 10 L/min  Filed Weights   08/24/22 0344 08/25/22 0356 08/26/22 0421  Weight: 84.2 kg 83.1 kg 83.7 kg     Intake/Output Summary (Last 24 hours) at 08/14/2022 1136 Last data filed at 08/12/2022 0700 Gross per 24 hour  Intake 995.38 ml  Output 800 ml  Net 195.38 ml   Net IO Since Admission: -17,796.94 mL [08/23/2022 1136]  Physical Exam: General: Chronically ill appearing male laying in bed, appears uncomfortable.  Pulmonary: Slightly increased work of breathing on HFNC.  Extremities: Palpable radial pulses.  Skin: Warm and dry. Skin laceration noted over L forearm Neuro: Not responsive to voice. Mumbles with sternal rub, but is not able to converse with [place] as he did yesterday.     ASSESSMENT/PLAN:  Assessment: Principal Problem:   Acute on chronic hypoxic respiratory failure (HCC) Active Problems:   Hypocalcemia   Hypotension   Pressure injury of skin   Pulmonary hypertension, primary (HCC)   Advanced care planning/counseling discussion   Acute renal failure superimposed on stage 4 chronic kidney disease (HCC)   Plan: End stage congestive heart failure, pulmonary hypertension, atrial fibrillation, and chronic kidney disease Yesterday our team met with the patient's wife, Erskine Squibb, and the palliative care team to discuss our concerns with his worsening clinical status. Given his multiple, severe chronic conditions, the decision was made to focus on  comfort and pursue hospice at [facility]. The plan was initially to transition to PO lasix and amiodarone, as these medications would keep him comfortable by not being volume overloaded and in RVR, however, the patient has acutely declined this morning. He is not responsive to voice, whereas yesterday he was able to participate in conversations. He awoke briefly to sternal rub, but did not do this later when I re-evaluated him with his wife at the bedside. I now suspect an in-hospital death, and Erskine Squibb has decided to pursue a 100% comfort approach. We have discontinued lasix and amiodarone, and have started the patient on a dilaudid drip. - Dilaudid gtt 1-4 mg/hr - Dilaudid bolus via infusion 0.5 mg q 10 min PRN for pain/dyspnea - Dilaudid 0.5 mg IV q2h PRN for pain/dyspnea - Ativan 0.5-1 mg q1h PRN for anxiety, sediation - Robinol PRN for excessive secretions - Zofran PRN for nausesa - Discontinue IV amio, lasix, and all other medications - Anticipate in hospital death (hours-days)  Best Practice: Code:  Comfort care Family Contact: Wife, Erskine Squibb, updated DISPO: Anticipated in-hospital death.  Signature: Elza Rafter, D.O.  Internal Medicine Resident, PGY-3 [facility]  Pager: [phone] 11:36 AM, 08/19/2022   Please contact the on call pager after 5 pm and on weekends at [phone].

## 2022-09-02 NOTE — Progress Notes (Signed)
   Patient Name: Ryan [name] Date of Encounter: 08/25/2022 [facility] Cardiologist: Nanetta Batty, MD Dr. Shirlee Latch  Interval Summary  .    Confusion.  Appreciate palliative care team.  Proceeding with comfort measures.  Vital Signs .    Vitals:   08/23/2022 0513 09/01/2022 0600 08/26/2022 0700 08/04/2022 0710  BP: (!) 107/59   (!) 88/55  Pulse: (!) 135 (!) 120 93 (!) 108  Resp: 14 15 13 15   Temp: 97.9 F (36.6 C)     TempSrc: Oral     SpO2: 94% 93% 98% 100%  Weight:      Height:        Intake/Output Summary (Last 24 hours) at 08/31/2022 8657 Last data filed at 08/05/2022 0700 Gross per 24 hour  Intake 995.38 ml  Output 800 ml  Net 195.38 ml      08/26/2022    4:21 AM 08/25/2022    3:56 AM 08/24/2022    3:44 AM  Last 3 Weights  Weight (lbs) 184 lb 8.4 oz 183 lb 3.2 oz 185 lb 10 oz  Weight (kg) 83.7 kg 83.1 kg 84.2 kg      Telemetry/ECG    Atrial fibrillation with RVR- Personally Reviewed  Physical Exam .   GEN: Elderly ill-appearing Neck: No JVD Cardiac: Irregularly irregular, tachycardic, no murmurs, rubs, or gallops.  Respiratory: normal rate GI: Soft, nontender, non-distended  MS: No edema  Assessment & Plan .     End-stage diastolic heart failure chronic kidney disease stage IV paroxysmal atrial fibrillation pulmonary hypertension -Appreciate palliative care team assisting with further comfort measures.  Hospice referral in place. -Agree with utilization of amiodarone as well as loop diuretic to continue with comfort/palliation.  We will place him back on torsemide 100 mg twice a day.  This was his home dose.  He was also taking metolazone 2.5 mg Tuesdays and Fridays.  This should be added at discharge. -Okay to DC Lipitor as suggested by palliative care.  Would also discontinue pulmonary hypertension medications.  Okay to DC pantoprazole. -Will transition IV amiodarone over to p.o. amiodarone 400 mg twice daily for 5 days, 200 mg twice daily for 2 weeks  and then 200 mg daily thereafter.     For questions or updates, please contact [facility] Please consult [email or web address] for contact info under        Signed, Donato Schultz, MD

## 2022-09-02 NOTE — TOC Progression Note (Signed)
Transition of Care [facility]) - Progression Note    Patient Details  Name: Ryan [name] MRN: [id number] Date of Birth: November 08, 1931  Transition of [facility] CM/SW Contact  Graves-Bigelow, Lamar Laundry, RN Phone Number: 08/03/2022, 11:26 AM  Clinical Narrative: Case Manager received an update from the Palliative NP that the patient is full comfort with anticipation of hospital death. Case Manager did call Consulting civil engineer Liaison to make hew aware of disposition plan. Case Manager also called Rene Kocher with [facility] to make her aware as well. No further needs identified at this time.    Expected Discharge Plan:  (Anticipate hospital death) Barriers to Discharge: No Barriers Identified  Expected Discharge Plan and Services In-house Referral: Clinical Social Work Discharge Planning Services: CM Consult   Living arrangements for the past 2 months: Independent Living Facility     HH Arranged: RN [facility] Agency: [facility] Date HH Agency Contacted: 08/26/22 Time HH Agency Contacted: 1616 Representative spoke with at [facility] Agency: [place]  Social Determinants of Health (SDOH) Interventions SDOH Screenings   Food Insecurity: No Food Insecurity (08/23/2022)  Housing: Patient Declined (08/23/2022)  Transportation Needs: No Transportation Needs (08/23/2022)  Utilities: Not At Risk (08/23/2022)  Financial Resource Strain: Low Risk  (02/08/2022)  Social Connections: Unknown (06/09/2021)   [facility]  Tobacco Use: Medium Risk (08/07/2022)    Readmission Risk Interventions     No data to display

## 2022-09-02 NOTE — Progress Notes (Signed)
Hydromorphone gtt 40cc wasted with Gertie Exon RN and Minna Antis RN

## 2022-09-02 NOTE — Progress Notes (Signed)
Palliative Medicine Inpatient Follow Up Note HPI: 89 y.o. male  with past medical history of pulmonary hypertension, DM2, HLD, HTN, asthma, CKD IV, macular degeneration, chronic pain, CHF, chronic hypotension admitted on 09/01/2022 with hypoxia and lower extremity edema. Found to be in volume overload.  Admitted and started on IV lasix. He has diuresed a significant amount with improvement in his lower extremity edema and shortness of breath. Admission complicated by delirium, acute on chronic kidney injury and pulmonary edema. He had an episode of possible aspiration with oxygen desaturation yesterday evening and repeat chest xray indicated worsening pulmonary edema. Cardiology recommending comfort focused care. Palliative medicine consulted for GOC.   Today's Discussion 08/31/2022  *Please note that this is a verbal dictation therefore any spelling or grammatical errors are due to the "Dragon Medical One" system interpretation.  Chart reviewed inclusive of vital signs, progress notes, laboratory results, and diagnostic images.   I met with Dametri this morning. He was quite somnolent. Noted to be lasix and amiodarone gtts as well as 10LPM HFNC. Patients night RN Harriett Sine met with me expressing concerns about the plan for him to transition back to [facility] with Hospice. I observed similar findings to hers in that once IV meds are removed he will likely transition into hypoxemia quickly.   I called patients spouse, Erskine Squibb who was very overwhelmed by the various conversations she had had. She and I reviewed the plan for her to come in this morning to meet for further conversations related to the safest discharge plan.   Dr. Birdie Sons and I met with Erskine Squibb this late morning. Created space and opportunity for Erskine Squibb to explore thoughts feelings and fears regarding her spouses current medical situation. Erskine Squibb expresses feeling overwhelmed and hoping we can help her understand what steps to take next. Dr. Ned Card was  able to describe that Rolly had an acute change in the last 24 hours he is no longer alert and able to eat/drink. We discussed the possibility of transitioning to comfort care in house and allowing nature to take it's course. We talked about starting a dilaudid gtt to abate symptoms once his other IV medications (lasix and amiodarone) stop.We reviewed allowing Erskine Squibb time to call friends and family.   Discussed at this time the importance of Erskine Squibb spending time with Fayrene Fearing. Dr. Ned Card and I shared that we would communicate with the [facility] team to speak to [facility] and Hospice about the change in plans.   Goals at this time are to transition emphasis to full comfort care and allow for a natural passing in house.    Questions and concerns addressed/Palliative Support Provided.   Objective Assessment: Vital Signs Vitals:   08/28/2022 0700 08/23/2022 0710  BP:  (!) 88/55  Pulse: 93 (!) 108  Resp: 13 15  Temp:    SpO2: 98% 100%    Intake/Output Summary (Last 24 hours) at 08/23/2022 1124 Last data filed at 08/13/2022 0700 Gross per 24 hour  Intake 995.38 ml  Output 800 ml  Net 195.38 ml   Last Weight  Most recent update: 08/26/2022  4:22 AM    Weight  83.7 kg (184 lb 8.4 oz)            Gen:  Elderly Caucasian M in NAD HEENT: Dry mucous membranes CV: Irregular rate and rhythm  PULM:On 10LPM HFNC, breathing is slightly labored ABD: soft/nontender  EXT: (+)edema  Neuro: Somnolent  SUMMARY OF RECOMMENDATIONS   DNAR/DNI  Full comfort care  Low dose dilaudid  gtt to start, additional comfort medications per [facility]  Wean O2 as able  DC Tele  Ongoing PMT support  Anticipate in hospital death  Time Spent: September 10, 2063 Billing based on MDM: High ______________________________________________________________________________________ Lamarr Lulas [employer] Palliative Medicine Team Team Cell Phone: [phone] Please utilize secure chat with additional questions, if there is no response  within 30 minutes please call the above phone number  Palliative Medicine Team providers are available by phone from 7am to 7pm daily and can be reached through the team cell phone.  Should this patient require assistance outside of these hours, please call the patient's attending physician.

## 2022-09-02 DEATH — deceased

## 2022-09-20 IMAGING — DX DG CHEST 1V PORT
1 series · 1 of 1 positions shown · non-contrast
Comparison: Chest x-ray dated October 17, 2020

CLINICAL DATA: Palpitations

EXAM:
PORTABLE CHEST 1 VIEW

[chest ap]
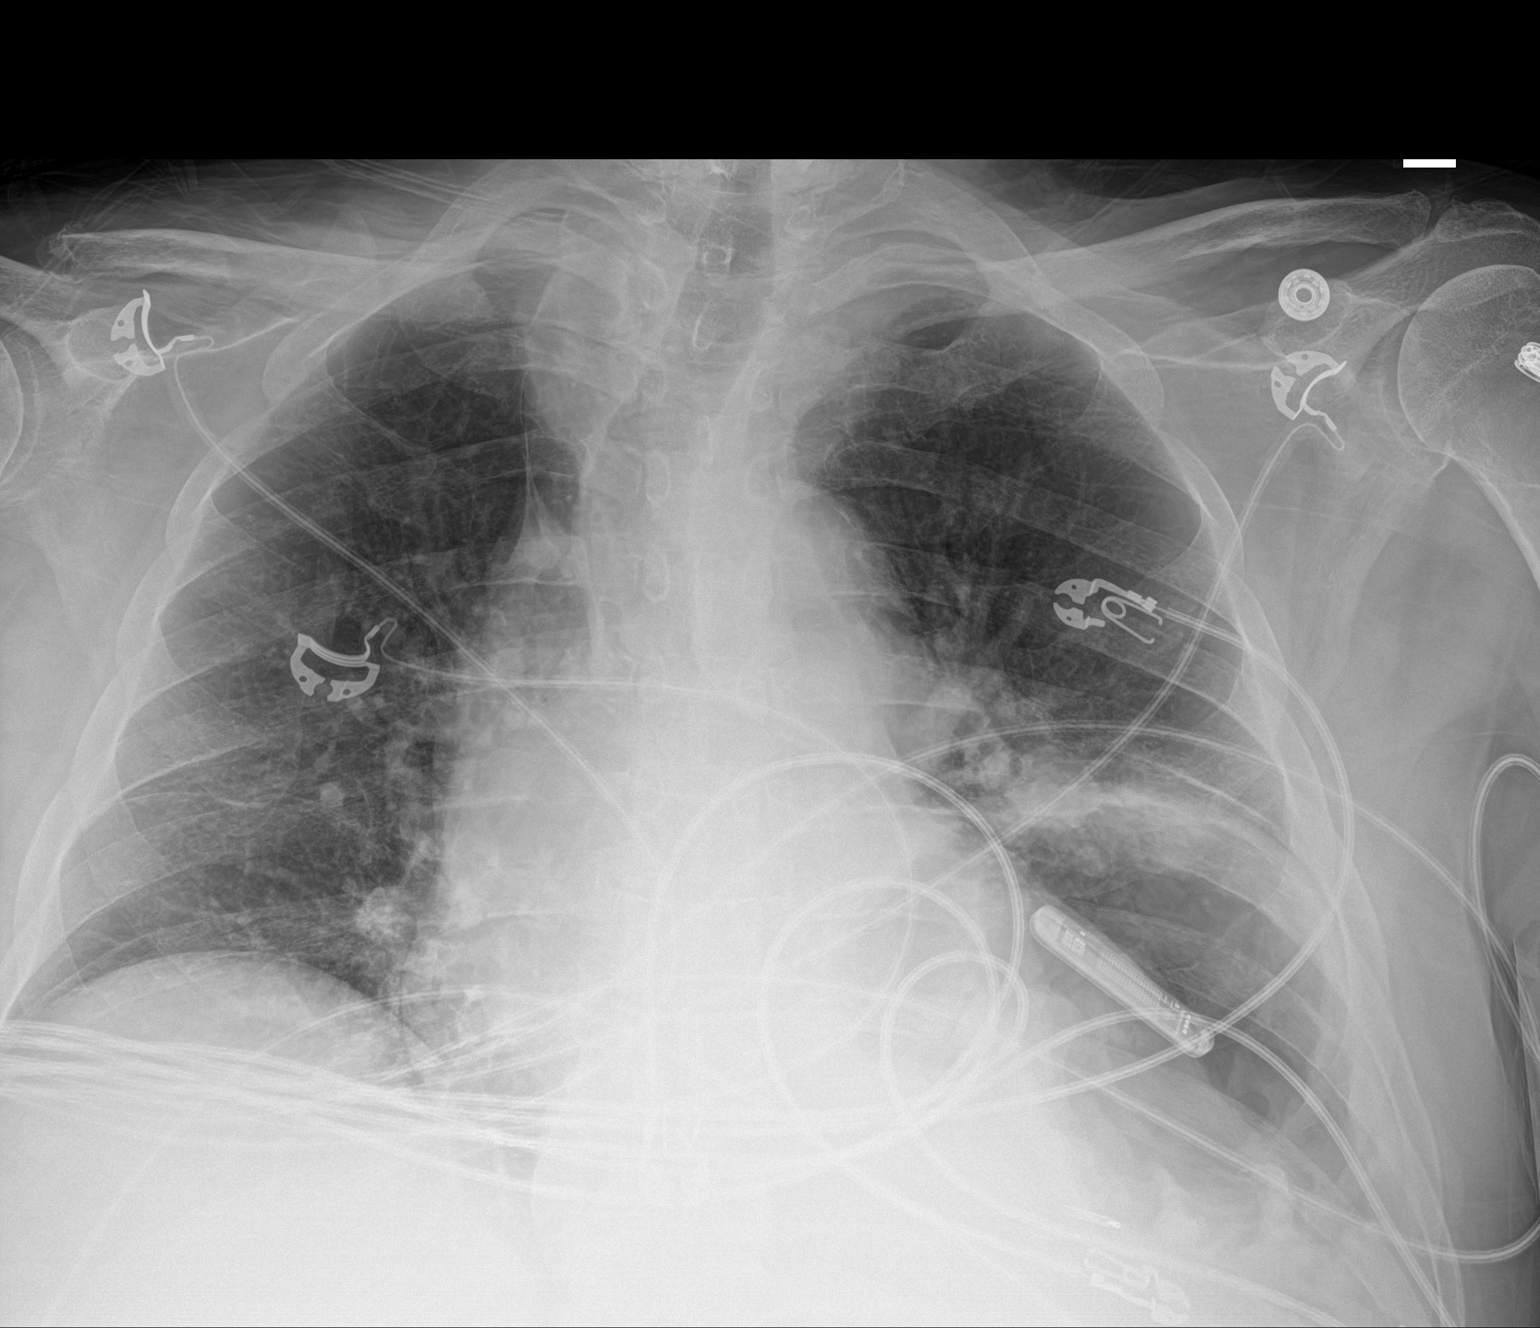

[1 of 1 positions shown; findings below may reference images not displayed]

FINDINGS: Cardiac and mediastinal contours are unchanged. Elevation of the
left hemidiaphragm. Left basilar opacity, similar to prior and
likely due to atelectasis. No new parenchymal opacity. No large
pleural effusion or pneumothorax.
IMPRESSION: Elevation of the left hemidiaphragm with associated left basilar
opacity which is similar to prior exam and likely due to
atelectasis.

## 2022-12-15 ENCOUNTER — Ambulatory Visit: Payer: Medicare HMO | Admitting: Neurology
